# Patient Record
Sex: Female | Born: 1955 | Race: White | Hispanic: No | Marital: Married | Smoking: Never smoker
Health system: Southern US, Community
[De-identification: ages and names within clinical notes are randomized; demographics above are authoritative.]

## PROBLEM LIST (undated history)

## (undated) DIAGNOSIS — C50919 Malignant neoplasm of unspecified site of unspecified female breast: Secondary | ICD-10-CM

## (undated) DIAGNOSIS — Z8052 Family history of malignant neoplasm of bladder: Secondary | ICD-10-CM

## (undated) DIAGNOSIS — Z803 Family history of malignant neoplasm of breast: Secondary | ICD-10-CM

## (undated) DIAGNOSIS — M25569 Pain in unspecified knee: Secondary | ICD-10-CM

## (undated) DIAGNOSIS — G8929 Other chronic pain: Secondary | ICD-10-CM

## (undated) DIAGNOSIS — E782 Mixed hyperlipidemia: Secondary | ICD-10-CM

## (undated) DIAGNOSIS — Z9289 Personal history of other medical treatment: Secondary | ICD-10-CM

## (undated) DIAGNOSIS — I10 Essential (primary) hypertension: Secondary | ICD-10-CM

## (undated) DIAGNOSIS — I519 Heart disease, unspecified: Secondary | ICD-10-CM

## (undated) DIAGNOSIS — R079 Chest pain, unspecified: Secondary | ICD-10-CM

## (undated) DIAGNOSIS — Z9861 Coronary angioplasty status: Secondary | ICD-10-CM

## (undated) DIAGNOSIS — J42 Unspecified chronic bronchitis: Secondary | ICD-10-CM

## (undated) DIAGNOSIS — I2510 Atherosclerotic heart disease of native coronary artery without angina pectoris: Secondary | ICD-10-CM

## (undated) DIAGNOSIS — J984 Other disorders of lung: Secondary | ICD-10-CM

## (undated) DIAGNOSIS — R109 Unspecified abdominal pain: Secondary | ICD-10-CM

## (undated) DIAGNOSIS — E785 Hyperlipidemia, unspecified: Secondary | ICD-10-CM

## (undated) DIAGNOSIS — C801 Malignant (primary) neoplasm, unspecified: Secondary | ICD-10-CM

## (undated) HISTORY — PX: HX CORONARY ARTERY BYPASS GRAFT: SHX141

## (undated) HISTORY — PX: HX TUBAL LIGATION: SHX77

## (undated) HISTORY — DX: Atherosclerotic heart disease of native coronary artery without angina pectoris: I25.10

## (undated) HISTORY — DX: Essential (primary) hypertension: I10

## (undated) HISTORY — DX: Personal history of other medical treatment: Z92.89

## (undated) HISTORY — DX: Unspecified chronic bronchitis: J42

## (undated) HISTORY — PX: CARDIAC CATHETERIZATION: SHX172

## (undated) HISTORY — DX: Mixed hyperlipidemia: E78.2

## (undated) HISTORY — DX: Malignant (primary) neoplasm, unspecified: C80.1

## (undated) HISTORY — PX: CORONARY ARTERY STENT PLACEMENT: PR CATH30456

## (undated) HISTORY — PX: HX MASTECTOMY, SIMPLE: SHX3

## (undated) HISTORY — DX: Coronary angioplasty status: Z98.61

## (undated) HISTORY — PX: CORONARY ANGIOPLASTY: SHX604

## (undated) HISTORY — PX: HX BACK SURGERY: SHX140

## (undated) HISTORY — DX: Heart disease, unspecified: I51.9

## (undated) HISTORY — DX: Other disorders of lung: J98.4

## (undated) HISTORY — DX: Chest pain, unspecified: R07.9

## (undated) HISTORY — DX: Unspecified abdominal pain: R10.9

## (undated) HISTORY — PX: NECK SURGERY: SHX720

## (undated) HISTORY — DX: Hyperlipidemia, unspecified: E78.5

## (undated) HISTORY — DX: Family history of malignant neoplasm of bladder: Z80.52

## (undated) HISTORY — DX: Pain in unspecified knee: M25.569

## (undated) HISTORY — DX: Other chronic pain: G89.29

## (undated) HISTORY — PX: OTHER SURGICAL HISTORY: SHX169

## (undated) HISTORY — DX: Family history of malignant neoplasm of breast: Z80.3

---

## 2010-03-10 HISTORY — PX: COLONOSCOPY: SHX174

## 2011-01-27 ENCOUNTER — Encounter: Payer: Self-pay | Admitting: Gastroenterology

## 2011-02-17 ENCOUNTER — Encounter: Payer: Self-pay | Admitting: Gastroenterology

## 2011-02-17 ENCOUNTER — Ambulatory Visit (AMBULATORY_SURGERY_CENTER): Payer: Managed Care, Other (non HMO) | Admitting: *Deleted

## 2011-02-17 VITALS — Ht 66.0 in | Wt 177.5 lb

## 2011-02-17 DIAGNOSIS — Z1211 Encounter for screening for malignant neoplasm of colon: Secondary | ICD-10-CM

## 2011-02-17 MED ORDER — PEG-KCL-NACL-NASULF-NA ASC-C 100 G PO SOLR: ORAL | Status: DC

## 2011-03-03 ENCOUNTER — Other Ambulatory Visit: Payer: Self-pay | Admitting: Gastroenterology

## 2011-03-16 ENCOUNTER — Ambulatory Visit (AMBULATORY_SURGERY_CENTER): Payer: Managed Care, Other (non HMO) | Admitting: Gastroenterology

## 2011-03-16 ENCOUNTER — Encounter: Payer: Self-pay | Admitting: Gastroenterology

## 2011-03-16 VITALS — BP 144/87 | HR 85 | Temp 97.5°F | Resp 14 | Ht 66.0 in | Wt 170.0 lb

## 2011-03-16 DIAGNOSIS — Z1211 Encounter for screening for malignant neoplasm of colon: Secondary | ICD-10-CM

## 2011-03-16 MED ORDER — SODIUM CHLORIDE 0.9 % IV SOLN: 500.0000 mL | INTRAVENOUS | Status: DC

## 2011-03-16 NOTE — Patient Instructions (Signed)
Follow your discharge instructions.  Continue your medications.  Next Colonoscopy 10 years, 2022!

## 2011-03-17 ENCOUNTER — Telehealth: Payer: Self-pay | Admitting: *Deleted

## 2011-03-17 NOTE — Telephone Encounter (Signed)

## 2011-08-26 ENCOUNTER — Encounter: Payer: Self-pay | Admitting: Gynecology

## 2013-09-23 ENCOUNTER — Ambulatory Visit (INDEPENDENT_AMBULATORY_CARE_PROVIDER_SITE_OTHER): Payer: Managed Care, Other (non HMO) | Admitting: Nurse Practitioner

## 2013-09-23 ENCOUNTER — Encounter (INDEPENDENT_AMBULATORY_CARE_PROVIDER_SITE_OTHER): Payer: Self-pay

## 2013-09-23 ENCOUNTER — Encounter: Payer: Self-pay | Admitting: Nurse Practitioner

## 2013-09-23 VITALS — BP 131/87 | HR 69 | Temp 97.9°F | Ht 65.0 in | Wt 173.0 lb

## 2013-09-23 DIAGNOSIS — Z0000 Encounter for general adult medical examination without abnormal findings: Secondary | ICD-10-CM

## 2013-09-23 NOTE — Patient Instructions (Signed)

## 2013-09-23 NOTE — Progress Notes (Signed)
   Subjective:    Patient ID: Judith Thomas, female    DOB: [birth year], 56 y.o.   MRN: [id number]  HPI  Patient in today for wellness visit- no pap- she is doing well - no complaint today-  No current medical problems and on no meds.    Review of Systems  Constitutional: Negative.   HENT: Negative.   Eyes: Negative.   Respiratory: Negative.   Cardiovascular: Negative.   Gastrointestinal: Negative.   Endocrine: Negative.   Genitourinary: Negative.   Musculoskeletal: Negative.   Allergic/Immunologic: Negative.   Neurological: Negative.   Hematological: Negative.   Psychiatric/Behavioral: Negative.   All other systems reviewed and are negative.       Objective:   Physical Exam  Constitutional: She is oriented to person, place, and time. She appears well-developed and well-nourished.  HENT:  Head: Normocephalic.  Right Ear: Hearing, tympanic membrane, external ear and ear canal normal.  Left Ear: Hearing, tympanic membrane, external ear and ear canal normal.  Nose: Nose normal.  Mouth/Throat: Uvula is midline and oropharynx is clear and moist.  Eyes: Conjunctivae and EOM are normal. Pupils are equal, round, and reactive to light.  Neck: Normal range of motion and full passive range of motion without pain. Neck supple. No JVD present. Carotid bruit is not present. No mass and no thyromegaly present.  Cardiovascular: Normal rate, normal heart sounds and intact distal pulses.   No murmur heard. Pulmonary/Chest: Effort normal and breath sounds normal.  Abdominal: Soft. Bowel sounds are normal. She exhibits no mass. There is no tenderness.  Genitourinary: No breast swelling, tenderness, discharge or bleeding.  bimanual exam-No adnexal masses or tenderness.  Breast- no masses palpable  Musculoskeletal: Normal range of motion.  Lymphadenopathy:    She has no cervical adenopathy.  Neurological: She is alert and oriented to person, place, and time.  Skin: Skin is warm and dry.    Psychiatric: She has a normal mood and affect. Her behavior is normal. Judgment and thought content normal.   BP 131/87  Pulse 69  Temp(Src) 97.9 F (36.6 C) (Oral)  Ht 5\' 5"  (1.651 m)  Wt 173 lb (78.472 kg)  BMI 28.79 kg/m2        Assessment & Plan:   1. Annual physical exam    Orders Placed This Encounter  Procedures  . CMP14+EGFR  . Lipid panel  . POCT CBC     Continue all meds Labs pending Diet and exercise encouraged Health maintenance reviewed Follow up in 1 year  [name] Daphine Deutscher, FNP

## 2013-09-23 NOTE — Addendum Note (Signed)
Addended by: Orma Render F on: 09/23/2013 11:04 AM   Modules accepted: Orders

## 2013-09-24 LAB — CMP14+EGFR
AST: 16 IU/L (ref 0–40)
Albumin/Globulin Ratio: 1.9 (ref 1.1–2.5)
Albumin: 4.4 g/dL (ref 3.5–5.5)
BUN/Creatinine Ratio: 21 (ref 9–23)
BUN: 14 mg/dL (ref 6–24)
CO2: 23 mmol/L (ref 18–29)
Calcium: 9.3 mg/dL (ref 8.7–10.2)
Chloride: 101 mmol/L (ref 97–108)
Creatinine, Ser: 0.68 mg/dL (ref 0.57–1.00)
GFR calc Af Amer: 113 mL/min/{1.73_m2} (ref >59)
Glucose: 89 mg/dL (ref 65–99)
Potassium: 4.7 mmol/L (ref 3.5–5.2)
Sodium: 140 mmol/L (ref 134–144)
Total Protein: 6.7 g/dL (ref 6.0–8.5)

## 2013-09-24 LAB — CBC WITH DIFFERENTIAL
Basos: 1 %
Eos: 2 %
Eosinophils Absolute: 0.1 10*3/uL (ref 0.0–0.4)
HCT: 38.3 % (ref 34.0–46.6)
Hemoglobin: 13.1 g/dL (ref 11.1–15.9)
Immature Grans (Abs): 0 10*3/uL (ref 0.0–0.1)
Immature Granulocytes: 0 %
Lymphocytes Absolute: 1.3 10*3/uL (ref 0.7–3.1)
Lymphs: 28 %
MCH: 31.6 pg (ref 26.6–33.0)
MCHC: 34.2 g/dL (ref 31.5–35.7)
MCV: 92 fL (ref 79–97)
Monocytes: 7 %
Neutrophils Absolute: 3 10*3/uL (ref 1.4–7.0)
Neutrophils Relative %: 62 %
Platelets: 262 10*3/uL (ref 150–379)
RBC: 4.15 x10E6/uL (ref 3.77–5.28)
WBC: 4.7 10*3/uL (ref 3.4–10.8)

## 2013-09-24 LAB — LIPID PANEL
Chol/HDL Ratio: 2.1 ratio units (ref 0.0–4.4)
Cholesterol, Total: 117 mg/dL (ref 100–199)
HDL: 57 mg/dL (ref >39)
Triglycerides: 68 mg/dL (ref 0–149)

## 2015-07-28 ENCOUNTER — Ambulatory Visit (INDEPENDENT_AMBULATORY_CARE_PROVIDER_SITE_OTHER): Payer: BLUE CROSS/BLUE SHIELD

## 2015-07-28 ENCOUNTER — Ambulatory Visit (INDEPENDENT_AMBULATORY_CARE_PROVIDER_SITE_OTHER): Payer: BLUE CROSS/BLUE SHIELD | Admitting: Nurse Practitioner

## 2015-07-28 ENCOUNTER — Encounter: Payer: Self-pay | Admitting: Nurse Practitioner

## 2015-07-28 VITALS — BP 128/82 | HR 66 | Temp 97.0°F | Ht 65.0 in | Wt 185.0 lb

## 2015-07-28 DIAGNOSIS — Z1212 Encounter for screening for malignant neoplasm of rectum: Secondary | ICD-10-CM | POA: Diagnosis not present

## 2015-07-28 DIAGNOSIS — Z0000 Encounter for general adult medical examination without abnormal findings: Secondary | ICD-10-CM

## 2015-07-28 DIAGNOSIS — Z6828 Body mass index (BMI) 28.0-28.9, adult: Secondary | ICD-10-CM | POA: Diagnosis not present

## 2015-07-28 DIAGNOSIS — Z23 Encounter for immunization: Secondary | ICD-10-CM

## 2015-07-28 NOTE — Addendum Note (Signed)
Addended by: Chevis Pretty on: 07/28/2015 09:50 AM   Modules accepted: Orders, SmartSet

## 2015-07-28 NOTE — Patient Instructions (Signed)
Exercising to Stay Healthy Exercising regularly is important. It has many health benefits, such as:  Improving your overall fitness, flexibility, and endurance.  Increasing your bone density.  Helping with weight control.  Decreasing your body fat.  Increasing your muscle strength.  Reducing stress and tension.  Improving your overall health. In order to become healthy and stay healthy, it is recommended that you do moderate-intensity and vigorous-intensity exercise. You can tell that you are exercising at a moderate intensity if you have a higher heart rate and faster breathing, but you are still able to hold a conversation. You can tell that you are exercising at a vigorous intensity if you are breathing much harder and faster and cannot hold a conversation while exercising. HOW OFTEN SHOULD I EXERCISE? Choose an activity that you enjoy and set realistic goals. Your health care provider can help you to make an activity plan that works for you. Exercise regularly as directed by your health care provider. This may include:   Doing resistance training twice each week, such as:  Push-ups.  Sit-ups.  Lifting weights.  Using resistance bands.  Doing a given intensity of exercise for a given amount of time. Choose from these options:  150 minutes of moderate-intensity exercise every week.  75 minutes of vigorous-intensity exercise every week.  A mix of moderate-intensity and vigorous-intensity exercise every week. Children, pregnant women, people who are out of shape, people who are overweight, and older adults may need to consult a health care provider for individual recommendations. If you have any sort of medical condition, be sure to consult your health care provider before starting a new exercise program.  WHAT ARE SOME EXERCISE IDEAS? Some moderate-intensity exercise ideas include:   Walking at a rate of 1 mile in 15  minutes.  Biking.  Hiking.  Golfing.  Dancing. Some vigorous-intensity exercise ideas include:   Walking at a rate of at least 4.5 miles per hour.  Jogging or running at a rate of 5 miles per hour.  Biking at a rate of at least 10 miles per hour.  Lap swimming.  Roller-skating or in-line skating.  Cross-country skiing.  Vigorous competitive sports, such as football, basketball, and soccer.  Jumping rope.  Aerobic dancing. WHAT ARE SOME EVERYDAY ACTIVITIES THAT CAN HELP ME TO GET EXERCISE?  Yard work, such as:  Pushing a lawn mower.  Raking and bagging leaves.  Washing and waxing your car.  Pushing a stroller.  Shoveling snow.  Gardening.  Washing windows or floors. HOW CAN I BE MORE ACTIVE IN MY DAY-TO-DAY ACTIVITIES?  Use the stairs instead of the elevator.  Take a walk during your lunch break.  If you drive, park your car farther away from work or school.  If you take public transportation, get off one stop early and walk the rest of the way.  Make all of your phone calls while standing up and walking around.  Get up, stretch, and walk around every 30 minutes throughout the day. WHAT GUIDELINES SHOULD I FOLLOW WHILE EXERCISING?  Do not exercise so much that you hurt yourself, feel dizzy, or get very short of breath.  Consult your health care provider before starting a new exercise program.  Wear comfortable clothes and shoes with good support.  Drink plenty of water while you exercise to prevent dehydration or heat stroke. Body water is lost during exercise and must be replaced.  Work out until you breathe faster and your heart beats faster.   This information   is not intended to replace advice given to you by your health care provider. Make sure you discuss any questions you have with your health care provider.   Document Released: 10/29/2010 Document Revised: 10/17/2014 Document Reviewed: 02/27/2014 Elsevier Interactive Patient Education  2016 [employer]  

## 2015-07-28 NOTE — Progress Notes (Addendum)
   Subjective:    Patient ID: Judith Thomas, female    DOB: [birth year], 59 y.o.   MRN: [id number]  HPI   Patient in today for wellness visit- no pap- she is doing well - no complaint today-  No current medical problems and on no meds.    Review of Systems  Constitutional: Negative.   HENT: Negative.   Eyes: Negative.   Respiratory: Negative.   Cardiovascular: Negative.   Gastrointestinal: Negative.   Endocrine: Negative.   Genitourinary: Negative.   Musculoskeletal: Negative.   Allergic/Immunologic: Negative.   Neurological: Negative.   Hematological: Negative.   Psychiatric/Behavioral: Negative.   All other systems reviewed and are negative.      Objective:   Physical Exam  Constitutional: She is oriented to person, place, and time. She appears well-developed and well-nourished.  HENT:  Head: Normocephalic.  Right Ear: Hearing, tympanic membrane, external ear and ear canal normal.  Left Ear: Hearing, tympanic membrane, external ear and ear canal normal.  Nose: Nose normal.  Mouth/Throat: Uvula is midline and oropharynx is clear and moist.  Eyes: Conjunctivae and EOM are normal. Pupils are equal, round, and reactive to light.  Neck: Normal range of motion and full passive range of motion without pain. Neck supple. No JVD present. Carotid bruit is not present. No thyroid mass and no thyromegaly present.  Cardiovascular: Normal rate, normal heart sounds and intact distal pulses.   No murmur heard. Pulmonary/Chest: Effort normal and breath sounds normal.  Abdominal: Soft. Bowel sounds are normal. She exhibits no mass. There is no tenderness.  Genitourinary: No breast swelling, tenderness, discharge or bleeding.  bimanual exam-No adnexal masses or tenderness.  Breast- no masses palpable  Musculoskeletal: Normal range of motion.  Lymphadenopathy:    She has no cervical adenopathy.  Neurological: She is alert and oriented to person, place, and time.  Skin: Skin is warm and  dry.  Psychiatric: She has a normal mood and affect. Her behavior is normal. Judgment and thought content normal.    BP 128/82 mmHg  Pulse 66  Temp(Src) 97 F (36.1 C) (Oral)  Ht $R'5\' 5"'Ek$  (1.651 m)  Wt 185 lb (83.915 kg)  BMI 30.79 kg/m2  EKG- sinus bradycardia-[name] Hassell Done, FNP   Chest xray- no cardiopulmonary disease noted-Preliminary reading by Ronnald Collum, FNP  [facility]         Assessment & Plan:   1. Annual physical exam - CMP14+EGFR - Lipid panel - CBC with Differential/Platelet - Thyroid Panel With TSH - Vit D  25 hydroxy (rtn osteoporosis monitoring) - DG Chest 2 View; Future - EKG 12-Lead  2. BMI 28.0-28.9,adult Discussed diet and exercise for person with BMI >25 Will recheck weight in 3-6 months    hemoccult cards given to patient with directions Labs pending Health maintenance reviewed Diet and exercise encouraged Continue all meds Follow up  In 6 months   [place], FNP

## 2015-07-29 DIAGNOSIS — Z23 Encounter for immunization: Secondary | ICD-10-CM

## 2015-07-29 LAB — THYROID PANEL WITH TSH
FREE THYROXINE INDEX: 2.2 (ref 1.2–4.9)
T3 UPTAKE RATIO: 28 % (ref 24–39)
T4, Total: 7.9 ug/dL (ref 4.5–12.0)
TSH: 1.37 u[IU]/mL (ref 0.450–4.500)

## 2015-07-29 LAB — CBC WITH DIFFERENTIAL/PLATELET
Basophils Absolute: 0 10*3/uL (ref 0.0–0.2)
Basos: 1 %
EOS (ABSOLUTE): 0.1 10*3/uL (ref 0.0–0.4)
Eos: 3 %
Hematocrit: 40.9 % (ref 34.0–46.6)
Hemoglobin: 13.3 g/dL (ref 11.1–15.9)
Immature Grans (Abs): 0 10*3/uL (ref 0.0–0.1)
Immature Granulocytes: 0 %
Lymphocytes Absolute: 1.4 10*3/uL (ref 0.7–3.1)
Lymphs: 31 %
MCH: 30.9 pg (ref 26.6–33.0)
MCHC: 32.5 g/dL (ref 31.5–35.7)
MCV: 95 fL (ref 79–97)
Monocytes Absolute: 0.3 10*3/uL (ref 0.1–0.9)
Monocytes: 7 %
NEUTROS PCT: 58 %
Neutrophils Absolute: 2.6 10*3/uL (ref 1.4–7.0)
Platelets: 221 10*3/uL (ref 150–379)
RBC: 4.3 x10E6/uL (ref 3.77–5.28)
RDW: 13.9 % (ref 12.3–15.4)
WBC: 4.4 10*3/uL (ref 3.4–10.8)

## 2015-07-29 LAB — LIPID PANEL
CHOL/HDL RATIO: 2.2 ratio (ref 0.0–4.4)
Cholesterol, Total: 125 mg/dL (ref 100–199)
HDL: 56 mg/dL (ref >39)
LDL Calculated: 52 mg/dL (ref 0–99)
Triglycerides: 85 mg/dL (ref 0–149)
VLDL Cholesterol Cal: 17 mg/dL (ref 5–40)

## 2015-07-29 LAB — CMP14+EGFR
ALBUMIN: 4.3 g/dL (ref 3.5–5.5)
ALT: 13 IU/L (ref 0–32)
AST: 17 IU/L (ref 0–40)
Albumin/Globulin Ratio: 1.7 (ref 1.1–2.5)
Alkaline Phosphatase: 108 IU/L (ref 39–117)
BUN / CREAT RATIO: 15 (ref 9–23)
BUN: 10 mg/dL (ref 6–24)
Bilirubin Total: 0.6 mg/dL (ref 0.0–1.2)
CO2: 27 mmol/L (ref 18–29)
CREATININE: 0.67 mg/dL (ref 0.57–1.00)
Calcium: 9.3 mg/dL (ref 8.7–10.2)
Chloride: 99 mmol/L (ref 97–106)
GFR calc Af Amer: 112 mL/min/{1.73_m2} (ref >59)
GFR calc non Af Amer: 97 mL/min/{1.73_m2} (ref >59)
GLOBULIN, TOTAL: 2.5 g/dL (ref 1.5–4.5)
Glucose: 94 mg/dL (ref 65–99)
Potassium: 4 mmol/L (ref 3.5–5.2)
Sodium: 140 mmol/L (ref 136–144)
TOTAL PROTEIN: 6.8 g/dL (ref 6.0–8.5)

## 2015-07-29 LAB — VITAMIN D 25 HYDROXY (VIT D DEFICIENCY, FRACTURES): Vit D, 25-Hydroxy: 22.7 ng/mL — ABNORMAL LOW (ref 30.0–100.0)

## 2015-07-30 ENCOUNTER — Encounter: Payer: Self-pay | Admitting: Nurse Practitioner

## 2015-08-06 ENCOUNTER — Other Ambulatory Visit: Payer: BLUE CROSS/BLUE SHIELD

## 2015-08-06 DIAGNOSIS — Z1212 Encounter for screening for malignant neoplasm of rectum: Secondary | ICD-10-CM

## 2015-08-06 NOTE — Progress Notes (Signed)
Lab only 

## 2015-08-11 LAB — FECAL OCCULT BLOOD, IMMUNOCHEMICAL: Fecal Occult Bld: NEGATIVE

## 2015-09-08 LAB — HM MAMMOGRAPHY: HM MAMMO: NEGATIVE

## 2015-09-14 ENCOUNTER — Encounter: Payer: Self-pay | Admitting: *Deleted

## 2015-10-11 DIAGNOSIS — G8929 Other chronic pain: Secondary | ICD-10-CM

## 2015-10-11 DIAGNOSIS — M25569 Pain in unspecified knee: Secondary | ICD-10-CM

## 2015-10-11 HISTORY — DX: Pain in unspecified knee: M25.569

## 2015-10-11 HISTORY — DX: Other chronic pain: G89.29

## 2016-02-09 ENCOUNTER — Ambulatory Visit (HOSPITAL_COMMUNITY): Payer: Self-pay | Admitting: Obstetrics & Gynecology

## 2016-05-02 DIAGNOSIS — B078 Other viral warts: Secondary | ICD-10-CM | POA: Diagnosis not present

## 2016-05-02 DIAGNOSIS — L820 Inflamed seborrheic keratosis: Secondary | ICD-10-CM | POA: Diagnosis not present

## 2016-08-02 ENCOUNTER — Ambulatory Visit (INDEPENDENT_AMBULATORY_CARE_PROVIDER_SITE_OTHER): Payer: BLUE CROSS/BLUE SHIELD | Admitting: Nurse Practitioner

## 2016-08-02 ENCOUNTER — Encounter: Payer: Self-pay | Admitting: Nurse Practitioner

## 2016-08-02 VITALS — BP 139/85 | HR 60 | Temp 98.1°F | Ht 65.0 in | Wt 185.0 lb

## 2016-08-02 DIAGNOSIS — Z1159 Encounter for screening for other viral diseases: Secondary | ICD-10-CM

## 2016-08-02 DIAGNOSIS — Z6828 Body mass index (BMI) 28.0-28.9, adult: Secondary | ICD-10-CM | POA: Diagnosis not present

## 2016-08-02 DIAGNOSIS — Z23 Encounter for immunization: Secondary | ICD-10-CM

## 2016-08-02 DIAGNOSIS — Z1211 Encounter for screening for malignant neoplasm of colon: Secondary | ICD-10-CM

## 2016-08-02 DIAGNOSIS — Z01419 Encounter for gynecological examination (general) (routine) without abnormal findings: Secondary | ICD-10-CM

## 2016-08-02 DIAGNOSIS — Z0000 Encounter for general adult medical examination without abnormal findings: Secondary | ICD-10-CM

## 2016-08-02 DIAGNOSIS — Z1212 Encounter for screening for malignant neoplasm of rectum: Secondary | ICD-10-CM

## 2016-08-02 NOTE — Progress Notes (Signed)
   Subjective:    Patient ID: Judith Thomas, female    DOB: Sep 02, 1956, 60 y.o.   MRN: [id number]  HPI Patient here today for annual physical exam and PAP. She has no current medical problems and is on no meds. No complaints today.    Review of Systems  Constitutional: Negative for diaphoresis.  HENT: Negative.   Eyes: Negative.  Negative for pain.  Respiratory: Negative for shortness of breath.   Cardiovascular: Negative for chest pain, palpitations and leg swelling.  Gastrointestinal: Negative for abdominal pain.  Endocrine: Negative for polydipsia.  Genitourinary: Negative.   Musculoskeletal: Negative.   Skin: Negative for rash.  Neurological: Negative for dizziness, weakness and headaches.  Hematological: Does not bruise/bleed easily.  Psychiatric/Behavioral: Negative.   All other systems reviewed and are negative.      Objective:   Physical Exam  Constitutional: She is oriented to person, place, and time. She appears well-developed and well-nourished.  HENT:  Head: Normocephalic.  Right Ear: Hearing, tympanic membrane, external ear and ear canal normal.  Left Ear: Hearing, tympanic membrane, external ear and ear canal normal.  Nose: Nose normal.  Mouth/Throat: Uvula is midline and oropharynx is clear and moist.  Eyes: Conjunctivae and EOM are normal. Pupils are equal, round, and reactive to light.  Neck: Normal range of motion and full passive range of motion without pain. Neck supple. No JVD present. Carotid bruit is not present. No thyroid mass and no thyromegaly present.  Cardiovascular: Normal rate, normal heart sounds and intact distal pulses.   No murmur heard. Pulmonary/Chest: Effort normal and breath sounds normal. Right breast exhibits no inverted nipple, no mass, no nipple discharge, no skin change and no tenderness. Left breast exhibits no inverted nipple, no mass, no nipple discharge, no skin change and no tenderness.  Abdominal: Soft. Bowel sounds are normal.  She exhibits no mass. There is no tenderness.  Genitourinary: Vagina normal and uterus normal. No breast swelling, tenderness, discharge or bleeding.  Genitourinary Comments: bimanual exam-No adnexal masses or tenderness.   Musculoskeletal: Normal range of motion.  Lymphadenopathy:    She has no cervical adenopathy.  Neurological: She is alert and oriented to person, place, and time.  Skin: Skin is warm and dry.  Psychiatric: She has a normal mood and affect. Her behavior is normal. Judgment and thought content normal.   BP 139/85   Pulse 60   Temp 98.1 F (36.7 C) (Oral)   Ht '5\' 5"'$  (1.651 m)   Wt 185 lb (83.9 kg)   BMI 30.79 kg/m         Assessment & Plan:  1. BMI 28.0-28.9,adult Discussed diet and exercise for person with BMI >25 Will recheck weight in 3-6 months  2. Annual physical exam - CMP14+EGFR - Lipid panel - CBC with Differential/Platelet - Thyroid Panel With TSH - VITAMIN D 25 Hydroxy (Vit-D Deficiency, Fractures)  3. Encounter for gynecological examination without abnormal finding - IGP, Aptima HPV, rfx 16/18,45  4. Need for hepatitis C screening test - Hepatitis C antibody  5. Screening for colorectal cancer - Fecal occult blood, imunochemical; Future    Labs pending Health maintenance reviewed Diet and exercise encouraged Continue all meds Follow up  In 1 year   [place], FNP

## 2016-08-02 NOTE — Patient Instructions (Signed)
Health Maintenance, Female Adopting a healthy lifestyle and getting preventive care can go a long way to promote health and wellness. Talk with your health care provider about what schedule of regular examinations is right for you. This is a good chance for you to check in with your provider about disease prevention and staying healthy. In between checkups, there are plenty of things you can do on your own. Experts have done a lot of research about which lifestyle changes and preventive measures are most likely to keep you healthy. Ask your health care provider for more information. WEIGHT AND DIET  Eat a healthy diet  Be sure to include plenty of vegetables, fruits, low-fat dairy products, and lean protein.  Do not eat a lot of foods high in solid fats, added sugars, or salt.  Get regular exercise. This is one of the most important things you can do for your health.  Most adults should exercise for at least 150 minutes each week. The exercise should increase your heart rate and make you sweat (moderate-intensity exercise).  Most adults should also do strengthening exercises at least twice a week. This is in addition to the moderate-intensity exercise.  Maintain a healthy weight  Body mass index (BMI) is a measurement that can be used to identify possible weight problems. It estimates body fat based on height and weight. Your health care provider can help determine your BMI and help you achieve or maintain a healthy weight.  For females 20 years of age and older:   A BMI below 18.5 is considered underweight.  A BMI of 18.5 to 24.9 is normal.  A BMI of 25 to 29.9 is considered overweight.  A BMI of 30 and above is considered obese.  Watch levels of cholesterol and blood lipids  You should start having your blood tested for lipids and cholesterol at 59 years of age, then have this test every 5 years.  You may need to have your cholesterol levels checked more often if:  Your lipid  or cholesterol levels are high.  You are older than 59 years of age.  You are at high risk for heart disease.  CANCER SCREENING   Lung Cancer  Lung cancer screening is recommended for adults 55-80 years old who are at high risk for lung cancer because of a history of smoking.  A yearly low-dose CT scan of the lungs is recommended for people who:  Currently smoke.  Have quit within the past 15 years.  Have at least a 30-pack-year history of smoking. A pack year is smoking an average of one pack of cigarettes a day for 1 year.  Yearly screening should continue until it has been 15 years since you quit.  Yearly screening should stop if you develop a health problem that would prevent you from having lung cancer treatment.  Breast Cancer  Practice breast self-awareness. This means understanding how your breasts normally appear and feel.  It also means doing regular breast self-exams. Let your health care provider know about any changes, no matter how small.  If you are in your 20s or 30s, you should have a clinical breast exam (CBE) by a health care provider every 1-3 years as part of a regular health exam.  If you are 40 or older, have a CBE every year. Also consider having a breast X-ray (mammogram) every year.  If you have a family history of breast cancer, talk to your health care provider about genetic screening.  If you   are at high risk for breast cancer, talk to your health care provider about having an MRI and a mammogram every year.  Breast cancer gene (BRCA) assessment is recommended for women who have family members with BRCA-related cancers. BRCA-related cancers include:  Breast.  Ovarian.  Tubal.  Peritoneal cancers.  Results of the assessment will determine the need for genetic counseling and BRCA1 and BRCA2 testing. Cervical Cancer Your health care provider may recommend that you be screened regularly for cancer of the pelvic organs (ovaries, uterus, and  vagina). This screening involves a pelvic examination, including checking for microscopic changes to the surface of your cervix (Pap test). You may be encouraged to have this screening done every 3 years, beginning at age 21.  For women ages 30-65, health care providers may recommend pelvic exams and Pap testing every 3 years, or they may recommend the Pap and pelvic exam, combined with testing for human papilloma virus (HPV), every 5 years. Some types of HPV increase your risk of cervical cancer. Testing for HPV may also be done on women of any age with unclear Pap test results.  Other health care providers may not recommend any screening for nonpregnant women who are considered low risk for pelvic cancer and who do not have symptoms. Ask your health care provider if a screening pelvic exam is right for you.  If you have had past treatment for cervical cancer or a condition that could lead to cancer, you need Pap tests and screening for cancer for at least 20 years after your treatment. If Pap tests have been discontinued, your risk factors (such as having a new sexual partner) need to be reassessed to determine if screening should resume. Some women have medical problems that increase the chance of getting cervical cancer. In these cases, your health care provider may recommend more frequent screening and Pap tests. Colorectal Cancer  This type of cancer can be detected and often prevented.  Routine colorectal cancer screening usually begins at 59 years of age and continues through 59 years of age.  Your health care provider may recommend screening at an earlier age if you have risk factors for colon cancer.  Your health care provider may also recommend using home test kits to check for hidden blood in the stool.  A small camera at the end of a tube can be used to examine your colon directly (sigmoidoscopy or colonoscopy). This is done to check for the earliest forms of colorectal  cancer.  Routine screening usually begins at age 50.  Direct examination of the colon should be repeated every 5-10 years through 59 years of age. However, you may need to be screened more often if early forms of precancerous polyps or small growths are found. Skin Cancer  Check your skin from head to toe regularly.  Tell your health care provider about any new moles or changes in moles, especially if there is a change in a mole's shape or color.  Also tell your health care provider if you have a mole that is larger than the size of a pencil eraser.  Always use sunscreen. Apply sunscreen liberally and repeatedly throughout the day.  Protect yourself by wearing long sleeves, pants, a wide-brimmed hat, and sunglasses whenever you are outside. HEART DISEASE, DIABETES, AND HIGH BLOOD PRESSURE   High blood pressure causes heart disease and increases the risk of stroke. High blood pressure is more likely to develop in:  People who have blood pressure in the high end   of the normal range (130-139/85-89 mm Hg).  People who are overweight or obese.  People who are African American.  If you are 38-23 years of age, have your blood pressure checked every 3-5 years. If you are 61 years of age or older, have your blood pressure checked every year. You should have your blood pressure measured twice--once when you are at a hospital or clinic, and once when you are not at a hospital or clinic. Record the average of the two measurements. To check your blood pressure when you are not at a hospital or clinic, you can use:  An automated blood pressure machine at a pharmacy.  A home blood pressure monitor.  If you are between 45 years and 39 years old, ask your health care provider if you should take aspirin to prevent strokes.  Have regular diabetes screenings. This involves taking a blood sample to check your fasting blood sugar level.  If you are at a normal weight and have a low risk for diabetes,  have this test once every three years after 59 years of age.  If you are overweight and have a high risk for diabetes, consider being tested at a younger age or more often. PREVENTING INFECTION  Hepatitis B  If you have a higher risk for hepatitis B, you should be screened for this virus. You are considered at high risk for hepatitis B if:  You were born in a country where hepatitis B is common. Ask your health care provider which countries are considered high risk.  Your parents were born in a high-risk country, and you have not been immunized against hepatitis B (hepatitis B vaccine).  You have HIV or AIDS.  You use needles to inject street drugs.  You live with someone who has hepatitis B.  You have had sex with someone who has hepatitis B.  You get hemodialysis treatment.  You take certain medicines for conditions, including cancer, organ transplantation, and autoimmune conditions. Hepatitis C  Blood testing is recommended for:  Everyone born from [birth year] through 1964.  Anyone with known risk factors for hepatitis C. Sexually transmitted infections (STIs)  You should be screened for sexually transmitted infections (STIs) including gonorrhea and chlamydia if:  You are sexually active and are younger than 59 years of age.  You are older than 59 years of age and your health care provider tells you that you are at risk for this type of infection.  Your sexual activity has changed since you were last screened and you are at an increased risk for chlamydia or gonorrhea. Ask your health care provider if you are at risk.  If you do not have HIV, but are at risk, it may be recommended that you take a prescription medicine daily to prevent HIV infection. This is called pre-exposure prophylaxis (PrEP). You are considered at risk if:  You are sexually active and do not regularly use condoms or know the HIV status of your partner(s).  You take drugs by injection.  You are sexually  active with a partner who has HIV. Talk with your health care provider about whether you are at high risk of being infected with HIV. If you choose to begin PrEP, you should first be tested for HIV. You should then be tested every 3 months for as long as you are taking PrEP.  PREGNANCY   If you are premenopausal and you may become pregnant, ask your health care provider about preconception counseling.  If you may  become pregnant, take 400 to 800 micrograms (mcg) of folic acid every day.  If you want to prevent pregnancy, talk to your health care provider about birth control (contraception). OSTEOPOROSIS AND MENOPAUSE   Osteoporosis is a disease in which the bones lose minerals and strength with aging. This can result in serious bone fractures. Your risk for osteoporosis can be identified using a bone density scan.  If you are 61 years of age or older, or if you are at risk for osteoporosis and fractures, ask your health care provider if you should be screened.  Ask your health care provider whether you should take a calcium or vitamin D supplement to lower your risk for osteoporosis.  Menopause may have certain physical symptoms and risks.  Hormone replacement therapy may reduce some of these symptoms and risks. Talk to your health care provider about whether hormone replacement therapy is right for you.  HOME CARE INSTRUCTIONS   Schedule regular health, dental, and eye exams.  Stay current with your immunizations.   Do not use any tobacco products including cigarettes, chewing tobacco, or electronic cigarettes.  If you are pregnant, do not drink alcohol.  If you are breastfeeding, limit how much and how often you drink alcohol.  Limit alcohol intake to no more than 1 drink per day for nonpregnant women. One drink equals 12 ounces of beer, 5 ounces of wine, or 1 ounces of hard liquor.  Do not use street drugs.  Do not share needles.  Ask your health care provider for help if  you need support or information about quitting drugs.  Tell your health care provider if you often feel depressed.  Tell your health care provider if you have ever been abused or do not feel safe at home.   This information is not intended to replace advice given to you by your health care provider. Make sure you discuss any questions you have with your health care provider.   Document Released: 04/11/2011 Document Revised: 10/17/2014 Document Reviewed: 08/28/2013 Elsevier Interactive Patient Education [employer].

## 2016-08-03 LAB — CBC WITH DIFFERENTIAL/PLATELET
BASOS ABS: 0 10*3/uL (ref 0.0–0.2)
BASOS: 0 %
EOS (ABSOLUTE): 0.1 10*3/uL (ref 0.0–0.4)
Eos: 2 %
Hematocrit: 38.8 % (ref 34.0–46.6)
Hemoglobin: 13.1 g/dL (ref 11.1–15.9)
IMMATURE GRANS (ABS): 0 10*3/uL (ref 0.0–0.1)
Immature Granulocytes: 0 %
LYMPHS: 26 %
Lymphocytes Absolute: 1.4 10*3/uL (ref 0.7–3.1)
MCH: 31.1 pg (ref 26.6–33.0)
MCHC: 33.8 g/dL (ref 31.5–35.7)
MCV: 92 fL (ref 79–97)
Monocytes Absolute: 0.4 10*3/uL (ref 0.1–0.9)
Monocytes: 8 %
NEUTROS ABS: 3.3 10*3/uL (ref 1.4–7.0)
Neutrophils: 64 %
Platelets: 213 10*3/uL (ref 150–379)
RBC: 4.21 x10E6/uL (ref 3.77–5.28)
RDW: 14 % (ref 12.3–15.4)
WBC: 5.2 10*3/uL (ref 3.4–10.8)

## 2016-08-03 LAB — THYROID PANEL WITH TSH
Free Thyroxine Index: 1.9 (ref 1.2–4.9)
T3 Uptake Ratio: 27 % (ref 24–39)
T4, Total: 6.9 ug/dL (ref 4.5–12.0)
TSH: 1.78 u[IU]/mL (ref 0.450–4.500)

## 2016-08-03 LAB — CMP14+EGFR
ALT: 12 IU/L (ref 0–32)
AST: 13 IU/L (ref 0–40)
Albumin/Globulin Ratio: 1.5 (ref 1.2–2.2)
Albumin: 4.4 g/dL (ref 3.5–5.5)
Alkaline Phosphatase: 106 IU/L (ref 39–117)
BUN/Creatinine Ratio: 16 (ref 9–23)
BUN: 12 mg/dL (ref 6–24)
Bilirubin Total: 0.6 mg/dL (ref 0.0–1.2)
CO2: 27 mmol/L (ref 18–29)
CREATININE: 0.77 mg/dL (ref 0.57–1.00)
Calcium: 9.5 mg/dL (ref 8.7–10.2)
Chloride: 101 mmol/L (ref 96–106)
GFR calc Af Amer: 98 mL/min/{1.73_m2} (ref >59)
GFR, EST NON AFRICAN AMERICAN: 85 mL/min/{1.73_m2} (ref >59)
Globulin, Total: 2.9 g/dL (ref 1.5–4.5)
Glucose: 93 mg/dL (ref 65–99)
POTASSIUM: 4.8 mmol/L (ref 3.5–5.2)
Sodium: 143 mmol/L (ref 134–144)
Total Protein: 7.3 g/dL (ref 6.0–8.5)

## 2016-08-03 LAB — LIPID PANEL
CHOL/HDL RATIO: 2.3 ratio (ref 0.0–4.4)
Cholesterol, Total: 126 mg/dL (ref 100–199)
HDL: 54 mg/dL (ref >39)
LDL CALC: 55 mg/dL (ref 0–99)
Triglycerides: 86 mg/dL (ref 0–149)
VLDL CHOLESTEROL CAL: 17 mg/dL (ref 5–40)

## 2016-08-03 LAB — HEPATITIS C ANTIBODY

## 2016-08-03 LAB — VITAMIN D 25 HYDROXY (VIT D DEFICIENCY, FRACTURES): Vit D, 25-Hydroxy: 23.2 ng/mL — ABNORMAL LOW (ref 30.0–100.0)

## 2016-08-04 ENCOUNTER — Encounter: Payer: Self-pay | Admitting: Nurse Practitioner

## 2016-08-08 LAB — IGP, APTIMA HPV, RFX 16/18,45
HPV Aptima: NEGATIVE
PAP Smear Comment: 0

## 2016-08-11 ENCOUNTER — Encounter: Payer: Self-pay | Admitting: Nurse Practitioner

## 2016-08-29 ENCOUNTER — Other Ambulatory Visit: Payer: BLUE CROSS/BLUE SHIELD

## 2016-08-29 DIAGNOSIS — Z1211 Encounter for screening for malignant neoplasm of colon: Secondary | ICD-10-CM

## 2016-08-29 DIAGNOSIS — Z1212 Encounter for screening for malignant neoplasm of rectum: Principal | ICD-10-CM

## 2016-08-31 LAB — FECAL OCCULT BLOOD, IMMUNOCHEMICAL: Fecal Occult Bld: NEGATIVE

## 2016-09-13 DIAGNOSIS — Z803 Family history of malignant neoplasm of breast: Secondary | ICD-10-CM | POA: Diagnosis not present

## 2016-09-13 DIAGNOSIS — Z1231 Encounter for screening mammogram for malignant neoplasm of breast: Secondary | ICD-10-CM | POA: Diagnosis not present

## 2017-01-24 ENCOUNTER — Ambulatory Visit (INDEPENDENT_AMBULATORY_CARE_PROVIDER_SITE_OTHER): Payer: BLUE CROSS/BLUE SHIELD | Admitting: Family

## 2017-01-24 ENCOUNTER — Encounter: Payer: Self-pay | Admitting: Family

## 2017-01-24 VITALS — BP 136/78 | HR 65 | Temp 97.1°F | Ht 65.0 in | Wt 190.0 lb

## 2017-01-24 DIAGNOSIS — R300 Dysuria: Secondary | ICD-10-CM | POA: Diagnosis not present

## 2017-01-24 DIAGNOSIS — N3001 Acute cystitis with hematuria: Secondary | ICD-10-CM | POA: Diagnosis not present

## 2017-01-24 LAB — URINALYSIS, COMPLETE
BILIRUBIN UA: NEGATIVE
GLUCOSE, UA: NEGATIVE
KETONES UA: NEGATIVE
NITRITE UA: NEGATIVE
PROTEIN UA: NEGATIVE
Specific Gravity, UA: 1.01 (ref 1.005–1.030)
Urobilinogen, Ur: 0.2 mg/dL (ref 0.2–1.0)
pH, UA: 7 (ref 5.0–7.5)

## 2017-01-24 LAB — MICROSCOPIC EXAMINATION: WBC, UA: >30 /hpf — AB (ref 0–?)

## 2017-01-24 MED ORDER — NITROFURANTOIN MONOHYD MACRO 100 MG PO CAPS: 100.0000 mg | ORAL_CAPSULE | Freq: Two times a day (BID) | ORAL | 0 refills | Status: DC

## 2017-01-24 NOTE — Patient Instructions (Signed)
Urinary Frequency, Adult Urinary frequency means urinating more often than usual. People with urinary frequency urinate at least 8 times in 24 hours, even if they drink a normal amount of fluid. Although they urinate more often than normal, the total amount of urine produced in a day may be normal. Urinary frequency is also called pollakiuria. What are the causes? This condition may be caused by:  A urinary tract infection.  Obesity.  Bladder problems, such as bladder stones.  Caffeine or alcohol.  Eating food or drinking fluids that irritate the bladder. These include coffee, tea, soda, artificial sweeteners, citrus, tomato-based foods, and chocolate.  Certain medicines, such as medicines that help the body get rid of extra fluid (diuretics).  Muscle or nerve weakness.  Overactive bladder.  Chronic diabetes.  Interstitial cystitis.  In men, problems with the prostate, such as an enlarged prostate.  In women, pregnancy. In some cases, the cause may not be known. What increases the risk? This condition is more likely to develop in:  Women who have gone through menopause.  Men with prostate problems.  People with a disease or injury that affects the nerves or spinal cord.  People who have or have had a condition that affects the brain, such as a stroke. What are the signs or symptoms? Symptoms of this condition include:  Feeling an urgent need to urinate often. The stress and anxiety of needing to find a bathroom quickly can make this urge worse.  Urinating 8 or more times in 24 hours.  Urinating as often as every 1 to 2 hours. How is this diagnosed? This condition is diagnosed based on your symptoms, your medical history, and a physical exam. You may have tests, such as:  Blood tests.  Urine tests.  Imaging tests, such as X-rays or ultrasounds.  A bladder test.  A test of your neurological system. This is the body system that senses the need to urinate.  A  test to check for problems in the urethra and bladder called cystoscopy. You may also be asked to keep a bladder diary. A bladder diary is a record of what you eat and drink, how often you urinate, and how much you urinate. You may need to see a health care provider who specializes in conditions of the urinary tract (urologist) or kidneys (nephrologist). How is this treated? Treatment for this condition depends on the cause. Sometimes the condition goes away on its own and treatment is not necessary. If treatment is needed, it may include:  Taking medicine.  Learning exercises that strengthen the muscles that help control urination.  Following a bladder training program. This may include:  Learning to delay going to the bathroom.  Double urinating (voiding). This helps if you are not completely emptying your bladder.  Scheduled voiding.  Making diet changes, such as:  Avoiding caffeine.  Drinking fewer fluids, especially alcohol.  Not drinking in the evening.  Not having foods or drinks that may irritate the bladder.  Eating foods that help prevent or ease constipation. Constipation can make this condition worse.  Having the nerves in your bladder stimulated. There are two options for stimulating the nerves to your bladder:  Outpatient electrical nerve stimulation. This is done by your health care provider.  Surgery to implant a bladder pacemaker. The pacemaker helps to control the urge to urinate. Follow these instructions at home:  Keep a bladder diary if told to by your health care provider.  Take over-the-counter and prescription medicines only as   told by your health care provider.  Do any exercises as told by your health care provider.  Follow a bladder training program as told by your health care provider.  Make any recommended diet changes.  Keep all follow-up visits as told by your health care provider. This is important. Contact a health care provider  if:  You start urinating more often.  You feel pain or irritation when you urinate.  You notice blood in your urine.  Your urine looks cloudy.  You develop a fever.  You begin vomiting. Get help right away if:  You are unable to urinate. This information is not intended to replace advice given to you by your health care provider. Make sure you discuss any questions you have with your health care provider. Document Released: 07/23/2009 Document Revised: 10/28/2015 Document Reviewed: 04/22/2015 Elsevier Interactive Patient Education  2017 [employer] Inc[employer]  

## 2017-01-24 NOTE — Progress Notes (Signed)
   Subjective:    Patient ID: Judith Thomas, female    DOB: 09/09/1956, 61 y.o.   MRN: [id number]  Dysuria   The current episode started in the past 7 days. The problem occurs intermittently. The problem has been waxing and waning. The quality of the pain is described as burning. The pain is at a severity of 3/10. The pain is mild. There has been no fever. Associated symptoms include urgency. Pertinent negatives include no flank pain, frequency, hematuria, hesitancy, nausea or vomiting. She has tried increased fluids for the symptoms. The treatment provided mild relief.      Review of Systems  Gastrointestinal: Negative for nausea and vomiting.  Genitourinary: Positive for dysuria and urgency. Negative for flank pain, frequency, hematuria and hesitancy.  All other systems reviewed and are negative.      Objective:   Physical Exam  Constitutional: She is oriented to person, place, and time. She appears well-developed and well-nourished. No distress.  HENT:  Head: Normocephalic.  Cardiovascular: Normal rate, regular rhythm, normal heart sounds and intact distal pulses.   No murmur heard. Pulmonary/Chest: Effort normal and breath sounds normal. No respiratory distress. She has no wheezes.  Abdominal: Soft. Bowel sounds are normal. She exhibits no distension. There is no tenderness.  Musculoskeletal: Normal range of motion. She exhibits no edema or tenderness.  Neurological: She is alert and oriented to person, place, and time.  Skin: Skin is warm and dry.  Psychiatric: She has a normal mood and affect. Her behavior is normal. Judgment and thought content normal.  Vitals reviewed.     BP 136/78   Pulse 65   Temp 97.1 F (36.2 C)   Ht 5\' 5"  (1.651 m)   Wt 190 lb (86.2 kg)   BMI 31.62 kg/m      Assessment & Plan:  1. Dysuria - Urinalysis, Complete  2. Acute cystitis with hematuria Force fluids AZO over the counter X2 days RTO prn Culture pending - nitrofurantoin,  macrocrystal-monohydrate, (MACROBID) 100 MG capsule; Take 1 capsule (100 mg total) by mouth 2 (two) times daily.  Dispense: 10 capsule; Refill: 0 - Urine culture   Evelina Dun, FNP

## 2017-01-26 LAB — URINE CULTURE

## 2017-02-20 ENCOUNTER — Ambulatory Visit (INDEPENDENT_AMBULATORY_CARE_PROVIDER_SITE_OTHER): Payer: BLUE CROSS/BLUE SHIELD | Admitting: Nurse Practitioner

## 2017-02-20 ENCOUNTER — Encounter: Payer: Self-pay | Admitting: Nurse Practitioner

## 2017-02-20 VITALS — BP 139/83 | HR 60 | Temp 97.2°F | Ht 65.0 in | Wt 187.0 lb

## 2017-02-20 DIAGNOSIS — W57XXXA Bitten or stung by nonvenomous insect and other nonvenomous arthropods, initial encounter: Secondary | ICD-10-CM

## 2017-02-20 DIAGNOSIS — S80921A Unspecified superficial injury of right lower leg, initial encounter: Secondary | ICD-10-CM | POA: Diagnosis not present

## 2017-02-20 MED ORDER — DOXYCYCLINE HYCLATE 100 MG PO TABS: 100.0000 mg | ORAL_TABLET | Freq: Two times a day (BID) | ORAL | 0 refills | Status: DC

## 2017-02-20 NOTE — Progress Notes (Signed)
   Subjective:    Patient ID: Judith Thomas, female    DOB: 03-30-1956, 61 y.o.   MRN: [id number]  HPI Patient comes into the office for a tick bite that took place 2 weeks ago.  Tick found after doing yardwork.  Patient noticed tick after getting out of the shower the morning after working in the yard.  Patient states the bite has been itching, but about 3 days ago he noticed the spot was warm to touch and there was a red ring around where she had pulled the tick off.     Review of Systems  Constitutional: Negative for activity change, appetite change and fatigue.  Respiratory: Negative for cough and shortness of breath.   Cardiovascular: Negative for chest pain.  Gastrointestinal: Negative for abdominal distention and abdominal pain.  Musculoskeletal: Negative for neck pain and neck stiffness.  Skin: Positive for rash (x3 days on the right shin).  All other systems reviewed and are negative.      Objective:   Physical Exam  Constitutional: She is oriented to person, place, and time. She appears well-developed and well-nourished.  HENT:  Head: Normocephalic.  Mouth/Throat: Oropharynx is clear and moist.  Eyes: Pupils are equal, round, and reactive to light.  Neck: Normal range of motion. Neck supple.  Cardiovascular: Normal rate, regular rhythm and normal heart sounds.   Pulmonary/Chest: Effort normal and breath sounds normal.  Abdominal: Soft. Bowel sounds are normal.  Musculoskeletal: Normal range of motion.  Neurological: She is alert and oriented to person, place, and time.  Skin: Skin is warm and dry.  3cm erythematous annular lesion with circular clearing on right shin-where she removed tick  Psychiatric: She has a normal mood and affect. Her behavior is normal. Judgment and thought content normal.    BP 139/83   Pulse 60   Temp 97.2 F (36.2 C) (Oral)   Ht 5\' 5"  (1.651 m)   Wt 187 lb (84.8 kg)   BMI 31.12 kg/m       Assessment & Plan:   1. Tick bite, initial  encounter    Meds ordered this encounter  Medications  . doxycycline (VIBRA-TABS) 100 MG tablet    Sig: Take 1 tablet (100 mg total) by mouth 2 (two) times daily. 1 po bid    Dispense:  28 tablet    Refill:  0    Order Specific Question:   Supervising Provider    Answer:   Eustaquio Maize [[id number]]   Avoid scratching Refused lyme test If any other symptoms develop RTO  [name] Hassell Done, FNP

## 2017-02-20 NOTE — Patient Instructions (Signed)
Tick Bite Information Introduction Ticks are insects that attach themselves to the skin. There are many types of ticks. Common types include wood ticks and deer ticks. Sometimes, ticks carry diseases that can make a person very ill. The most common places for ticks to attach themselves are the scalp, neck, armpits, waist, and groin. HOW CAN YOU PREVENT TICK BITES? Take these steps to help prevent tick bites when you are outdoors:  Wear long sleeves and long pants.  Wear white clothes so you can see ticks more easily.  Tuck your pant legs into your socks.  If walking on a trail, stay in the middle of the trail to avoid brushing against bushes.  Avoid walking through areas with long grass.  Put bug spray on all skin that is showing and along boot tops, pant legs, and sleeve cuffs.  Check clothes, hair, and skin often and before going inside.  Brush off any ticks that are not attached.  Take a shower or bath as soon as possible after being outdoors. HOW SHOULD YOU REMOVE A TICK? Ticks should be removed as soon as possible to help prevent diseases. 1. If latex gloves are available, put them on before trying to remove a tick. 2. Use tweezers to grasp the tick as close to the skin as possible. You may also use curved forceps or a tick removal tool. Grasp the tick as close to its head as possible. Avoid grasping the tick on its body. 3. Pull gently upward until the tick lets go. Do not twist the tick or jerk it suddenly. This may break off the tick's head or mouth parts. 4. Do not squeeze or crush the tick's body. This could force disease-carrying fluids from the tick into your body. 5. After the tick is removed, wash the bite area and your hands with soap and water or alcohol. 6. Apply a small amount of antiseptic cream or ointment to the bite site. 7. Wash any tools that were used. Do not try to remove a tick by applying a hot match, petroleum jelly, or fingernail polish to the tick. These  methods do not work. They may also increase the chances of disease being spread from the tick bite. WHEN SHOULD YOU SEEK HELP? Contact your health care provider if you are unable to remove a tick or if a part of the tick breaks off in the skin. After a tick bite, you need to watch for signs and symptoms of diseases that can be spread by ticks. Contact your health care provider if you develop any of the following:  Fever.  Rash.  Redness and puffiness (swelling) in the area of the tick bite.  Tender, puffy lymph glands.  Watery poop (diarrhea).  Weight loss.  Cough.  Feeling more tired than normal (fatigue).  Muscle, joint, or bone pain.  Belly (abdominal) pain.  Headache.  Change in your level of consciousness.  Trouble walking or moving your legs.  Loss of feeling (numbness) in the legs.  Loss of movement (paralysis).  Shortness of breath.  Confusion.  Throwing up (vomiting) many times. This information is not intended to replace advice given to you by your health care provider. Make sure you discuss any questions you have with your health care provider. Document Released: 12/21/2009 Document Revised: 03/03/2016 Document Reviewed: 03/06/2013 Elsevier Interactive Patient Education  2017 Elsevier Inc.  

## 2017-07-21 ENCOUNTER — Ambulatory Visit (INDEPENDENT_AMBULATORY_CARE_PROVIDER_SITE_OTHER): Payer: BLUE CROSS/BLUE SHIELD | Admitting: Nurse Practitioner

## 2017-07-21 ENCOUNTER — Encounter: Payer: Self-pay | Admitting: Nurse Practitioner

## 2017-07-21 VITALS — BP 157/82 | HR 63 | Temp 97.2°F | Ht 64.0 in | Wt 185.0 lb

## 2017-07-21 DIAGNOSIS — Z23 Encounter for immunization: Secondary | ICD-10-CM

## 2017-07-21 DIAGNOSIS — Z0000 Encounter for general adult medical examination without abnormal findings: Secondary | ICD-10-CM | POA: Diagnosis not present

## 2017-07-21 DIAGNOSIS — Z6828 Body mass index (BMI) 28.0-28.9, adult: Secondary | ICD-10-CM

## 2017-07-21 NOTE — Progress Notes (Signed)
   Subjective:    Patient ID: Judith Thomas, female    DOB: April 18, 1956, 61 y.o.   MRN: [id number]  HPI Patient comes in today for annual physical exam. SHe is doing well today without complaints. She has no current medical problems and is on no meds.  BP Readings from Last 3 Encounters:  07/21/17 (!) 157/82  02/20/17 139/83  01/24/17 136/78     Review of Systems  Constitutional: Negative for activity change and appetite change.  HENT: Negative.   Eyes: Negative for pain.  Respiratory: Negative for shortness of breath.   Cardiovascular: Negative for chest pain, palpitations and leg swelling.  Gastrointestinal: Negative for abdominal pain.  Endocrine: Negative for polydipsia.  Genitourinary: Negative.   Skin: Negative for rash.  Neurological: Negative for dizziness, weakness and headaches.  Hematological: Does not bruise/bleed easily.  Psychiatric/Behavioral: Negative.   All other systems reviewed and are negative.      Objective:   Physical Exam  Constitutional: She is oriented to person, place, and time. She appears well-developed and well-nourished.  HENT:  Nose: Nose normal.  Mouth/Throat: Oropharynx is clear and moist.  Eyes: EOM are normal.  Neck: Trachea normal, normal range of motion and full passive range of motion without pain. Neck supple. No JVD present. Carotid bruit is not present. No thyromegaly present.  Cardiovascular: Normal rate, regular rhythm, normal heart sounds and intact distal pulses.  Exam reveals no gallop and no friction rub.   No murmur heard. Pulmonary/Chest: Effort normal and breath sounds normal.  Abdominal: Soft. Bowel sounds are normal. She exhibits no distension and no mass. There is no tenderness.  Musculoskeletal: Normal range of motion.  Lymphadenopathy:    She has no cervical adenopathy.  Neurological: She is alert and oriented to person, place, and time. She has normal reflexes.  Skin: Skin is warm and dry.  Psychiatric: She has a  normal mood and affect. Her behavior is normal. Judgment and thought content normal.   BP (!) 157/82   Pulse 63   Temp (!) 97.2 F (36.2 C) (Oral)   Ht '5\' 4"'$  (1.626 m)   Wt 185 lb (83.9 kg)   BMI 31.76 kg/m         Assessment & Plan:  1. Annual physical exam - CBC with Differential/Platelet - CMP14+EGFR - Lipid panel - Thyroid Panel With TSH  2. BMI 28.0-28.9,adult Discussed diet and exercise for person with BMI >25 Will recheck weight in 3-6 months    Labs pending Health maintenance reviewed Diet and exercise encouraged Continue all meds Follow up  In 1year   Brook Highland, FNP

## 2017-07-21 NOTE — Patient Instructions (Signed)

## 2017-07-23 LAB — CBC WITH DIFFERENTIAL/PLATELET
Basophils Absolute: 0 10*3/uL (ref 0.0–0.2)
Basos: 0 %
EOS (ABSOLUTE): 0.1 10*3/uL (ref 0.0–0.4)
Eos: 2 %
Hematocrit: 36.7 % (ref 34.0–46.6)
Hemoglobin: 12.3 g/dL (ref 11.1–15.9)
Immature Grans (Abs): 0 10*3/uL (ref 0.0–0.1)
Immature Granulocytes: 0 %
LYMPHS: 27 %
Lymphocytes Absolute: 1.3 10*3/uL (ref 0.7–3.1)
MCH: 31 pg (ref 26.6–33.0)
MCHC: 33.5 g/dL (ref 31.5–35.7)
MCV: 92 fL (ref 79–97)
MONOS ABS: 0.3 10*3/uL (ref 0.1–0.9)
Monocytes: 7 %
NEUTROS ABS: 3 10*3/uL (ref 1.4–7.0)
Neutrophils: 64 %
Platelets: 190 10*3/uL (ref 150–379)
RBC: 3.97 x10E6/uL (ref 3.77–5.28)
RDW: 14.1 % (ref 12.3–15.4)
WBC: 4.7 10*3/uL (ref 3.4–10.8)

## 2017-07-23 LAB — CMP14+EGFR
ALBUMIN: 4.3 g/dL (ref 3.6–4.8)
ALT: 25 IU/L (ref 0–32)
AST: 19 IU/L (ref 0–40)
Albumin/Globulin Ratio: 1.5 (ref 1.2–2.2)
Alkaline Phosphatase: 109 IU/L (ref 39–117)
BUN / CREAT RATIO: 14 (ref 12–28)
BUN: 10 mg/dL (ref 8–27)
Bilirubin Total: 0.7 mg/dL (ref 0.0–1.2)
CALCIUM: 9.2 mg/dL (ref 8.7–10.3)
CO2: 25 mmol/L (ref 20–29)
Chloride: 104 mmol/L (ref 96–106)
Creatinine, Ser: 0.73 mg/dL (ref 0.57–1.00)
GFR, EST AFRICAN AMERICAN: 104 mL/min/{1.73_m2} (ref >59)
GFR, EST NON AFRICAN AMERICAN: 90 mL/min/{1.73_m2} (ref >59)
Globulin, Total: 2.9 g/dL (ref 1.5–4.5)
Glucose: 85 mg/dL (ref 65–99)
Potassium: 4.4 mmol/L (ref 3.5–5.2)
Sodium: 143 mmol/L (ref 134–144)
Total Protein: 7.2 g/dL (ref 6.0–8.5)

## 2017-07-23 LAB — LIPID PANEL
CHOL/HDL RATIO: 2.6 ratio (ref 0.0–4.4)
Cholesterol, Total: 134 mg/dL (ref 100–199)
HDL: 52 mg/dL (ref >39)
LDL Calculated: 66 mg/dL (ref 0–99)
Triglycerides: 82 mg/dL (ref 0–149)
VLDL CHOLESTEROL CAL: 16 mg/dL (ref 5–40)

## 2017-07-23 LAB — THYROID PANEL WITH TSH
Free Thyroxine Index: 2 (ref 1.2–4.9)
T3 UPTAKE RATIO: 28 % (ref 24–39)
T4, Total: 7 ug/dL (ref 4.5–12.0)
TSH: 1.26 u[IU]/mL (ref 0.450–4.500)

## 2017-07-31 ENCOUNTER — Telehealth: Payer: Self-pay | Admitting: Nurse Practitioner

## 2017-08-01 NOTE — Telephone Encounter (Signed)
Advised patient that form was mailed and that it takes a little longer because it goes through our [employer] system. Patient advised that is she hasn't received by the end of the week she will let us know.

## 2017-09-15 DIAGNOSIS — Z1231 Encounter for screening mammogram for malignant neoplasm of breast: Secondary | ICD-10-CM | POA: Diagnosis not present

## 2017-11-10 DIAGNOSIS — Z1283 Encounter for screening for malignant neoplasm of skin: Secondary | ICD-10-CM | POA: Diagnosis not present

## 2017-11-10 DIAGNOSIS — X32XXXA Exposure to sunlight, initial encounter: Secondary | ICD-10-CM | POA: Diagnosis not present

## 2017-11-10 DIAGNOSIS — L821 Other seborrheic keratosis: Secondary | ICD-10-CM | POA: Diagnosis not present

## 2017-11-10 DIAGNOSIS — B078 Other viral warts: Secondary | ICD-10-CM | POA: Diagnosis not present

## 2017-11-10 DIAGNOSIS — L820 Inflamed seborrheic keratosis: Secondary | ICD-10-CM | POA: Diagnosis not present

## 2017-11-10 DIAGNOSIS — L570 Actinic keratosis: Secondary | ICD-10-CM | POA: Diagnosis not present

## 2018-01-18 ENCOUNTER — Encounter: Payer: Self-pay | Admitting: Pediatrics

## 2018-01-18 ENCOUNTER — Ambulatory Visit: Payer: BLUE CROSS/BLUE SHIELD | Admitting: Pediatrics

## 2018-01-18 VITALS — BP 130/81 | HR 72 | Temp 97.3°F | Ht 64.0 in | Wt 191.0 lb

## 2018-01-18 DIAGNOSIS — N3090 Cystitis, unspecified without hematuria: Secondary | ICD-10-CM | POA: Diagnosis not present

## 2018-01-18 DIAGNOSIS — R399 Unspecified symptoms and signs involving the genitourinary system: Secondary | ICD-10-CM

## 2018-01-18 LAB — URINALYSIS, COMPLETE
Bilirubin, UA: NEGATIVE
Glucose, UA: NEGATIVE
Ketones, UA: NEGATIVE
Nitrite, UA: NEGATIVE
Specific Gravity, UA: 1.01 (ref 1.005–1.030)
Urobilinogen, Ur: 0.2 mg/dL (ref 0.2–1.0)
pH, UA: 6.5 (ref 5.0–7.5)

## 2018-01-18 LAB — MICROSCOPIC EXAMINATION
RBC, UA: >30 /HPF — AB (ref 0–2)
Renal Epithel, UA: NONE SEEN /HPF
WBC, UA: >30 /HPF — AB (ref 0–5)

## 2018-01-18 MED ORDER — NITROFURANTOIN MONOHYD MACRO 100 MG PO CAPS: 100.0000 mg | ORAL_CAPSULE | Freq: Two times a day (BID) | ORAL | 0 refills | Status: AC

## 2018-01-18 NOTE — Progress Notes (Signed)
  Subjective:   Patient ID: Judith Thomas, female    DOB: 1956/05/28, 62 y.o.   MRN: [id number] CC: Back Pain; Painful Urination; and Abnormal Urine color  HPI: Judith Thomas is a 62 y.o. female presenting for Back Pain; Painful Urination; and Abnormal Urine color  Started having symptoms last night.  Feels like when her urinary tract infections have felt like in the past.  Every void has had some burning.  Small amount of stomach pain.  Appetite is been okay.  No fevers.  Has been sometime since her last UTI.  Relevant past medical, surgical, family and social history reviewed. Allergies and medications reviewed and updated. Social History   Tobacco Use  Smoking Status Never Smoker  Smokeless Tobacco Never Used   ROS: Per HPI   Objective:    BP 130/81   Pulse 72   Temp (!) 97.3 F (36.3 C) (Oral)   Ht 5\' 4"  (1.626 m)   Wt 191 lb (86.6 kg)   BMI 32.79 kg/m   Wt Readings from Last 3 Encounters:  01/18/18 191 lb (86.6 kg)  07/21/17 185 lb (83.9 kg)  02/20/17 187 lb (84.8 kg)    Gen: NAD, alert, cooperative with exam, NCAT EYES: EOMI, no conjunctival injection, or no icterus CV: NRRR, normal S1/S2, no murmur, distal pulses 2+ b/l Resp: CTABL, no wheezes, normal WOB Abd: +BS, soft, NTND. no guarding or organomegaly, no CVA tenderness Ext: No edema, warm Neuro: Alert and oriented, strength equal b/l UE and LE, coordination grossly normal MSK: normal muscle bulk  Assessment & Plan:  Judith Thomas was seen today for back pain, painful urination and abnormal urine color.  Diagnoses and all orders for this visit:  Cystitis UA positive, treat with below, follow-up urine culture. -     nitrofurantoin, macrocrystal-monohydrate, (MACROBID) 100 MG capsule; Take 1 capsule (100 mg total) by mouth 2 (two) times daily for 5 days.  UTI symptoms -     Urinalysis, Complete -     Urine Culture; Future -     Urine Culture   Follow up plan: Return if symptoms worsen or fail to  improve. Assunta Found, MD [place]

## 2018-01-20 LAB — URINE CULTURE

## 2018-08-15 ENCOUNTER — Ambulatory Visit (INDEPENDENT_AMBULATORY_CARE_PROVIDER_SITE_OTHER): Payer: BLUE CROSS/BLUE SHIELD

## 2018-08-15 ENCOUNTER — Encounter: Payer: Self-pay | Admitting: Nurse Practitioner

## 2018-08-15 ENCOUNTER — Ambulatory Visit (INDEPENDENT_AMBULATORY_CARE_PROVIDER_SITE_OTHER): Payer: BLUE CROSS/BLUE SHIELD | Admitting: Nurse Practitioner

## 2018-08-15 VITALS — BP 123/76 | HR 67 | Temp 97.4°F | Ht 65.0 in | Wt 182.0 lb

## 2018-08-15 DIAGNOSIS — Z6830 Body mass index (BMI) 30.0-30.9, adult: Secondary | ICD-10-CM

## 2018-08-15 DIAGNOSIS — Z23 Encounter for immunization: Secondary | ICD-10-CM | POA: Diagnosis not present

## 2018-08-15 DIAGNOSIS — Z0000 Encounter for general adult medical examination without abnormal findings: Secondary | ICD-10-CM

## 2018-08-15 NOTE — Progress Notes (Signed)
   Subjective:    Patient ID: Judith Thomas, female    DOB: Jul 04, 1956, 62 y.o.   MRN: [id number]   Chief Complaint: Annual Exam   HPI:  1. Annual physical exam  Patient in today for yearly exam. She has no current medical problems and is on no chronic meds.  2. BMI 32.0-32.9,adult  Weight is down 9lbs since last visit      New complaints: None today  Social history: Retired at end of march from a [employer] center.   Review of Systems  Constitutional: Negative for activity change and appetite change.  HENT: Negative.   Eyes: Negative for pain.  Respiratory: Negative for shortness of breath.   Cardiovascular: Negative for chest pain, palpitations and leg swelling.  Gastrointestinal: Negative for abdominal pain.  Endocrine: Negative for polydipsia.  Genitourinary: Negative.   Skin: Negative for rash.  Neurological: Negative for dizziness, weakness and headaches.  Hematological: Does not bruise/bleed easily.  Psychiatric/Behavioral: Negative.   All other systems reviewed and are negative.      Objective:   Physical Exam  Constitutional: She is oriented to person, place, and time. She appears well-developed and well-nourished. No distress.  HENT:  Head: Normocephalic.  Nose: Nose normal.  Mouth/Throat: Oropharynx is clear and moist.  Eyes: Pupils are equal, round, and reactive to light. EOM are normal.  Neck: Normal range of motion. Neck supple. No JVD present. Carotid bruit is not present.  Cardiovascular: Normal rate, regular rhythm, normal heart sounds and intact distal pulses.  Pulmonary/Chest: Effort normal and breath sounds normal. No respiratory distress. She has no wheezes. She has no rales. She exhibits no tenderness.  Abdominal: Soft. Normal appearance, normal aorta and bowel sounds are normal. She exhibits no distension, no abdominal bruit, no pulsatile midline mass and no mass. There is no splenomegaly or hepatomegaly. There is no tenderness.    Musculoskeletal: Normal range of motion. She exhibits no edema.  Lymphadenopathy:    She has no cervical adenopathy.  Neurological: She is alert and oriented to person, place, and time. She has normal reflexes.  Skin: Skin is warm and dry.  Psychiatric: She has a normal mood and affect. Her behavior is normal. Judgment and thought content normal.  Nursing note and vitals reviewed.  BP 123/76   Pulse 67   Temp (!) 97.4 F (36.3 C) (Oral)   Ht '5\' 5"'$  (1.651 m)   Wt 182 lb (82.6 kg)   BMI 30.29 kg/m   EKG- NSR Chest xray- no acute or chronic cardiopulmonary issues-Preliminary reading by Judith Collum, FNP  [facility]      Assessment & Plan:  Judith Thomas comes in today with chief complaint of Annual Exam   Diagnosis and orders addressed:  1. Annual physical exam - CBC with Differential/Platelet - CMP14+EGFR - Lipid panel - Thyroid Panel With TSH - DG Chest 2 View - EKG 12-Lead  2. BMI 30.0-30.9,adult Discussed diet and exercise for person with BMI >25 Will recheck weight in 3-6 months   Labs pending Health Maintenance reviewed Diet and exercise encouraged  Follow up plan: 1 year and prn   Judith Hassell Done, FNP

## 2018-08-15 NOTE — Patient Instructions (Signed)

## 2018-08-16 LAB — CBC WITH DIFFERENTIAL/PLATELET
BASOS: 1 %
Basophils Absolute: 0 10*3/uL (ref 0.0–0.2)
EOS (ABSOLUTE): 0.1 10*3/uL (ref 0.0–0.4)
EOS: 2 %
HEMATOCRIT: 38 % (ref 34.0–46.6)
Hemoglobin: 12.9 g/dL (ref 11.1–15.9)
IMMATURE GRANS (ABS): 0 10*3/uL (ref 0.0–0.1)
IMMATURE GRANULOCYTES: 0 %
LYMPHS: 27 %
Lymphocytes Absolute: 1.4 10*3/uL (ref 0.7–3.1)
MCH: 30.6 pg (ref 26.6–33.0)
MCHC: 33.9 g/dL (ref 31.5–35.7)
MCV: 90 fL (ref 79–97)
MONOCYTES: 8 %
MONOS ABS: 0.4 10*3/uL (ref 0.1–0.9)
NEUTROS PCT: 62 %
Neutrophils Absolute: 3.1 10*3/uL (ref 1.4–7.0)
Platelets: 219 10*3/uL (ref 150–450)
RBC: 4.22 x10E6/uL (ref 3.77–5.28)
RDW: 12.4 % (ref 12.3–15.4)
WBC: 5 10*3/uL (ref 3.4–10.8)

## 2018-08-16 LAB — LIPID PANEL
Chol/HDL Ratio: 2.4 ratio (ref 0.0–4.4)
Cholesterol, Total: 118 mg/dL (ref 100–199)
HDL: 50 mg/dL (ref >39)
LDL CALC: 54 mg/dL (ref 0–99)
TRIGLYCERIDES: 68 mg/dL (ref 0–149)
VLDL CHOLESTEROL CAL: 14 mg/dL (ref 5–40)

## 2018-08-16 LAB — CMP14+EGFR
ALBUMIN: 4.5 g/dL (ref 3.6–4.8)
ALK PHOS: 105 IU/L (ref 39–117)
ALT: 12 IU/L (ref 0–32)
AST: 18 IU/L (ref 0–40)
Albumin/Globulin Ratio: 1.7 (ref 1.2–2.2)
BUN / CREAT RATIO: 18 (ref 12–28)
BUN: 14 mg/dL (ref 8–27)
Bilirubin Total: 0.6 mg/dL (ref 0.0–1.2)
CALCIUM: 9.5 mg/dL (ref 8.7–10.3)
CO2: 19 mmol/L — AB (ref 20–29)
CREATININE: 0.8 mg/dL (ref 0.57–1.00)
Chloride: 103 mmol/L (ref 96–106)
GFR calc Af Amer: 92 mL/min/{1.73_m2} (ref >59)
GFR, EST NON AFRICAN AMERICAN: 80 mL/min/{1.73_m2} (ref >59)
GLUCOSE: 93 mg/dL (ref 65–99)
Globulin, Total: 2.7 g/dL (ref 1.5–4.5)
Potassium: 4.8 mmol/L (ref 3.5–5.2)
Sodium: 142 mmol/L (ref 134–144)
TOTAL PROTEIN: 7.2 g/dL (ref 6.0–8.5)

## 2018-08-16 LAB — THYROID PANEL WITH TSH
Free Thyroxine Index: 2.1 (ref 1.2–4.9)
T3 Uptake Ratio: 30 % (ref 24–39)
T4, Total: 6.9 ug/dL (ref 4.5–12.0)
TSH: 1.64 u[IU]/mL (ref 0.450–4.500)

## 2018-09-17 DIAGNOSIS — Z1231 Encounter for screening mammogram for malignant neoplasm of breast: Secondary | ICD-10-CM | POA: Diagnosis not present

## 2018-09-19 DIAGNOSIS — N6312 Unspecified lump in the right breast, upper inner quadrant: Secondary | ICD-10-CM | POA: Diagnosis not present

## 2018-09-19 DIAGNOSIS — N6315 Unspecified lump in the right breast, overlapping quadrants: Secondary | ICD-10-CM | POA: Diagnosis not present

## 2018-09-26 ENCOUNTER — Other Ambulatory Visit: Payer: Self-pay | Admitting: Radiology

## 2018-09-26 DIAGNOSIS — N6312 Unspecified lump in the right breast, upper inner quadrant: Secondary | ICD-10-CM | POA: Diagnosis not present

## 2018-09-26 DIAGNOSIS — C50211 Malignant neoplasm of upper-inner quadrant of right female breast: Secondary | ICD-10-CM | POA: Diagnosis not present

## 2018-09-26 LAB — HM MAMMOGRAPHY

## 2018-10-18 ENCOUNTER — Ambulatory Visit: Payer: Self-pay | Admitting: General Surgery

## 2018-10-18 DIAGNOSIS — Z170 Estrogen receptor positive status [ER+]: Secondary | ICD-10-CM | POA: Diagnosis not present

## 2018-10-18 DIAGNOSIS — C50211 Malignant neoplasm of upper-inner quadrant of right female breast: Secondary | ICD-10-CM | POA: Diagnosis not present

## 2018-10-19 ENCOUNTER — Other Ambulatory Visit: Payer: Self-pay | Admitting: General Surgery

## 2018-10-19 DIAGNOSIS — C50211 Malignant neoplasm of upper-inner quadrant of right female breast: Secondary | ICD-10-CM

## 2018-10-19 DIAGNOSIS — Z171 Estrogen receptor negative status [ER-]: Principal | ICD-10-CM

## 2018-10-22 ENCOUNTER — Telehealth: Payer: Self-pay | Admitting: Hematology and Oncology

## 2018-10-22 NOTE — Progress Notes (Signed)
Location of Breast Cancer: Right Breast  Histology per Pathology Report:  09/26/18 Diagnosis 1. Breast, right, needle core biopsy, 12:30 o'clock, 5 cm fn - INVASIVE DUCTAL CARCINOMA, GRADE II/III. - SEE MICROSCOPIC DESCRIPTION.  Receptor Status: ER (70%), PR (NEG), Her2-neu (POS), Ki (20%)  2. Breast, right, needle core biopsy, 1:30 o'clock, 6 cm fn - INVASIVE DUCTAL CARCINOMA, GRADE II/III. - SEE MICROSCOPIC DESCRIPTION.  Receptor Status: ER(100%), PR (20%), Her2-neu (POS), Ki-(10%)  Did patient present with symptoms or was this found on screening mammography?: It was found on a screening mammogram.   Past/Anticipated interventions by surgeon, if any: Dr. [name] [facility] insertion planned for 11/07/18  Past/Anticipated interventions by medical oncology, if any:  10/24/18 Dr. Lindi Adie Recommendation based on multidisciplinary tumor board: 1. Neoadjuvant chemotherapy with Taxol Herceptin weekly x12 followed by Herceptin maintenance vs Kadcyla for 1 year 2. Followed by breast conserving surgery if possible with sentinel lymph node study 3. Followed by adjuvant radiation therapy if patient had lumpectomy 4.  Followed by antiestrogen therapy  Lymphedema issues, if any:  N/A  Pain issues, if any:  She denies  SAFETY ISSUES:  Prior radiation? No  Pacemaker/ICD? No  Possible current pregnancy? No  Is the patient on methotrexate? No  Current Complaints / other details:   10/28/18 MRI bilateral Breasts.   BP (!) 146/90 (BP Location: Left Arm, Patient Position: Sitting)   Pulse 67   Temp 98.1 F (36.7 C) (Oral)   Resp 18   Ht _0  (1.651 m)   Wt 184 lb 2 oz (83.5 kg)   SpO2 99%   BMI 30.64 kg/m    Wt Readings from Last 3 Encounters:  10/26/18 184 lb 2 oz (83.5 kg)  10/24/18 184 lb (83.5 kg)  08/15/18 182 lb (82.6 kg)      Kimori Tartaglia, Stephani Police, RN 10/22/2018,9:38 AM

## 2018-10-22 NOTE — Telephone Encounter (Signed)
A new patient appt has been scheduled for the pt to see Dr. Lindi Adie on 1/15 at 4pm. Pt aware to arrive 30 minutes early. Letter mailed.

## 2018-10-23 NOTE — Progress Notes (Signed)
[facility] CONSULT NOTE  Patient Care Team: Chevis Pretty, FNP as PCP - General (Nurse Practitioner)  CHIEF COMPLAINTS/PURPOSE OF CONSULTATION: Newly diagnosed breast cancer  HISTORY OF PRESENTING ILLNESS:  Judith Thomas 63 y.o. female is here because of recent diagnosis of invasive ductal carcinoma of the right breast. The cancer was detected on a routine screening mammogram on 09/17/18 and was not palpable prior to diagnosis. A biopsy on 09/26/18 showed the mass at 12:30 o'clock 5cm fn to be grade 2, ER 70%, PR 0%, HER2 positive, Ki67 20% and the mass at 1:30 o'clock 6cm fn to be grade 2, ER 100%, PR 20%, HER2 positive, Ki67 10% and the cancer to be stage 1a. She has a family history of breast cancer in two of her maternal aunts at ages 58 and 59. Her port insertion is scheduled for 11/07/18 with Dr. [name] and a breast MRI is scheduled for 10/28/18.  She presents to the clinic today with her husband and daughter, who is a cardiac nurse here at [facility]. She was not interested in [place]. She reports she walks daily and bowls weekly and will continue through treatment. She noted she is interested in participation in the [employer] clinical trial. She recently retired in March of 2019.   I reviewed her records extensively and collaborated the history with the patient.  REVIEW OF SYSTEMS:   Constitutional: Denies fevers, chills or abnormal night sweats Eyes: Denies blurriness of vision, double vision or watery eyes Ears, nose, mouth, throat, and face: Denies mucositis or sore throat Respiratory: Denies cough, dyspnea or wheezes Cardiovascular: Denies palpitation, chest discomfort or lower extremity swelling Gastrointestinal:  Denies nausea, heartburn or change in bowel habits Skin: Denies abnormal skin rashes Lymphatics: Denies new lymphadenopathy or easy bruising Neurological: Denies numbness, tingling or new weaknesses Behavioral/Psych: Mood is stable, no new changes  Breast:  Denies any discharge All other systems were reviewed with the patient and are negative.  SUMMARY OF ONCOLOGIC HISTORY:   Malignant neoplasm of upper-inner quadrant of right breast in female, estrogen receptor positive ([place])   09/26/2018 Initial Diagnosis    Two right breast masses 12:00 to 1 o'clock position 1.3 cm and 1.2 cm, they are 1.5 cm apart.  Biopsy revealed grade 2 invasive ductal carcinoma ER 70%- 100%, PR 0% -20%, Ki-67 20%, HER-2 3+ by IHC and FISH ratio 2.15 with a gene copy number of 4.2 for the tumor that was 2+ by IHC, T1c N0 stage Ia    10/24/2018 Cancer Staging    Staging form: Breast, AJCC 8th Edition - Clinical stage from 10/24/2018: Stage IA (cT1c(2), cN0, cM0, G2, ER+, PR+, HER2+) - Signed by Nicholas Lose, MD on 10/24/2018    MEDICAL HISTORY:  No past medical history on file.  SURGICAL HISTORY: Past Surgical History:  Procedure Laterality Date  . COLONOSCOPY  03/2010  . none      SOCIAL HISTORY: Social History   Socioeconomic History  . Marital status: Married    Spouse name: Not on file  . Number of children: Not on file  . Years of education: Not on file  . Highest education level: Not on file  Occupational History  . Not on file  Social Needs  . Financial resource strain: Not on file  . Food insecurity:    Worry: Not on file    Inability: Not on file  . Transportation needs:    Medical: Not on file    Non-medical: Not on file  Tobacco  Use  . Smoking status: Never Smoker  . Smokeless tobacco: Never Used  Substance and Sexual Activity  . Alcohol use: Yes    Alcohol/week: 1.0 standard drinks    Types: 1 Standard drinks or equivalent per week  . Drug use: No  . Sexual activity: Not on file  Lifestyle  . Physical activity:    Days per week: Not on file    Minutes per session: Not on file  . Stress: Not on file  Relationships  . Social connections:    Talks on phone: Not on file    Gets together: Not on file    Attends religious service:  Not on file    Active member of club or organization: Not on file    Attends meetings of clubs or organizations: Not on file    Relationship status: Not on file  . Intimate partner violence:    Fear of current or ex partner: Not on file    Emotionally abused: Not on file    Physically abused: Not on file    Forced sexual activity: Not on file  Other Topics Concern  . Not on file  Social History Narrative  . Not on file    FAMILY HISTORY: Family History  Problem Relation Age of Onset  . Liver disease Mother   . Heart disease Mother        afib  . Heart disease Father   . Asthma Father   . Diabetes Father     ALLERGIES:  has No Known Allergies.  MEDICATIONS:  Current Outpatient Medications  Medication Sig Dispense Refill  . Multiple Vitamin (MULTIVITAMIN) tablet Take 1 tablet by mouth daily.     No current facility-administered medications for this visit.    PHYSICAL EXAMINATION: ECOG PERFORMANCE STATUS: 1 - Symptomatic but completely ambulatory  Vitals:   10/24/18 1555  BP: (!) 142/77  Pulse: 85  Resp: 18  Temp: 98.4 F (36.9 C)  SpO2: 100%   Filed Weights   10/24/18 1555  Weight: 184 lb (83.5 kg)    GENERAL: alert, no distress and comfortable SKIN: skin color, texture, turgor are normal, no rashes or significant lesions EYES: normal, conjunctiva are pink and non-injected, sclera clear OROPHARYNX: no exudate, no erythema and lips, buccal mucosa, and tongue normal  NECK: supple, thyroid normal size, non-tender, without nodularity LYMPH: no palpable lymphadenopathy in the cervical, axillary or inguinal LUNGS: clear to auscultation and percussion with normal breathing effort HEART: regular rate & rhythm and no murmurs and no lower extremity edema ABDOMEN: abdomen soft, non-tender and normal bowel sounds Musculoskeletal: no cyanosis of digits and no clubbing  PSYCH: alert & oriented x 3 with fluent speech NEURO: no focal motor/sensory deficits  LABORATORY  DATA:  I have reviewed the data as listed Lab Results  Component Value Date   WBC 5.0 08/15/2018   HGB 12.9 08/15/2018   HCT 38.0 08/15/2018   MCV 90 08/15/2018   PLT 219 08/15/2018   Lab Results  Component Value Date   NA 142 08/15/2018   K 4.8 08/15/2018   CL 103 08/15/2018   CO2 19 (L) 08/15/2018    RADIOGRAPHIC STUDIES: I have personally reviewed the radiological reports and agreed with the findings in the report.  ASSESSMENT AND PLAN:  Malignant neoplasm of upper-inner quadrant of right breast in female, estrogen receptor positive ([place]) 09/27/19: Two right breast masses 12:00 to 1 o'clock position 1.3 cm and 1.2 cm, they are 1.5 cm apart.  Biopsy  revealed grade 2 invasive ductal carcinoma ER 70%- 100%, PR 0% -20%, Ki-67 20%, HER-2 3+ by IHC and FISH ratio 2.15 with a gene copy number of 4.2 for the tumor that was 2+ by IHC, T1c N0 stage Ia  Pathology and radiology counseling: Discussed with the patient, the details of pathology including the type of breast cancer,the clinical staging, the significance of ER, PR and HER-2/neu receptors and the implications for treatment. After reviewing the pathology in detail, we proceeded to discuss the different treatment options between surgery, radiation, chemotherapy, antiestrogen therapies.  Recommendation based on multidisciplinary tumor board: 1. Neoadjuvant chemotherapy with Taxol Herceptin weekly x12 followed by Herceptin maintenance vs Kadcyla for 1 year 2. Followed by breast conserving surgery if possible with sentinel lymph node study 3. Followed by adjuvant radiation therapy if patient had lumpectomy 4.  Followed by antiestrogen therapy  Chemotherapy Counseling: I discussed the risks and benefits of chemotherapy including the risks of nausea/ vomiting, risk of infection from low WBC count, fatigue due to chemo or anemia, bruising or bleeding due to low platelets, mouth sores, loss/ change in taste and decreased appetite. Liver and  kidney function will be monitored through out chemotherapy as abnormalities in liver and kidney function may be a side effect of treatment. Cardiac dysfunction due to Herceptin and Perjeta were discussed in detail. Risk of permanent bone marrow dysfunction due to chemo were also discussed.  Plan: 1. Port placement 2. Echocardiogram 3. Chemotherapy class 4. Breast MRI  [employer] clinical trial was discussed with the patient and she is very interested in it. Her daughter is a cardiac nurse and wants to make sure her mother participates in the clinical study  Return to clinic in 2 weeks to start chemotherapy on 11/08/2018.     All questions were answered. The patient knows to call the clinic with any problems, questions or concerns.   Nicholas Lose, MD 10/24/2018   I, Cloyde Reams Dorshimer, am acting as scribe for Nicholas Lose, MD.  I have reviewed the above documentation for accuracy and completeness, and I agree with the above.

## 2018-10-24 ENCOUNTER — Other Ambulatory Visit: Payer: Self-pay

## 2018-10-24 ENCOUNTER — Inpatient Hospital Stay: Payer: BLUE CROSS/BLUE SHIELD | Attending: Hematology and Oncology | Admitting: Hematology and Oncology

## 2018-10-24 DIAGNOSIS — Z5112 Encounter for antineoplastic immunotherapy: Secondary | ICD-10-CM | POA: Diagnosis not present

## 2018-10-24 DIAGNOSIS — Z5111 Encounter for antineoplastic chemotherapy: Secondary | ICD-10-CM | POA: Diagnosis not present

## 2018-10-24 DIAGNOSIS — Z170 Estrogen receptor positive status [ER+]: Secondary | ICD-10-CM | POA: Diagnosis not present

## 2018-10-24 DIAGNOSIS — C50211 Malignant neoplasm of upper-inner quadrant of right female breast: Secondary | ICD-10-CM | POA: Diagnosis not present

## 2018-10-24 DIAGNOSIS — Z803 Family history of malignant neoplasm of breast: Secondary | ICD-10-CM | POA: Insufficient documentation

## 2018-10-24 NOTE — Assessment & Plan Note (Addendum)
09/27/19: Two right breast masses 12:00 to 1 o'clock position 1.3 cm and 1.2 cm, they are 1.5 cm apart.  Biopsy revealed grade 2 invasive ductal carcinoma ER 70%- 100%, PR 0% -20%, Ki-67 20%, HER-2 3+ by IHC and FISH ratio 2.15 with a gene copy number of 4.2 for the tumor that was 2+ by IHC, T1c N0 stage Ia  Pathology and radiology counseling: Discussed with the patient, the details of pathology including the type of breast cancer,the clinical staging, the significance of ER, PR and HER-2/neu receptors and the implications for treatment. After reviewing the pathology in detail, we proceeded to discuss the different treatment options between surgery, radiation, chemotherapy, antiestrogen therapies.  Recommendation based on multidisciplinary tumor board: 1. Neoadjuvant chemotherapy with Taxol Herceptin weekly x12 followed by Herceptin maintenance vs Kadcyla for 1 year 2. Followed by breast conserving surgery if possible with sentinel lymph node study 3. Followed by adjuvant radiation therapy if patient had lumpectomy 4.  Followed by antiestrogen therapy  Chemotherapy Counseling: I discussed the risks and benefits of chemotherapy including the risks of nausea/ vomiting, risk of infection from low WBC count, fatigue due to chemo or anemia, bruising or bleeding due to low platelets, mouth sores, loss/ change in taste and decreased appetite. Liver and kidney function will be monitored through out chemotherapy as abnormalities in liver and kidney function may be a side effect of treatment. Cardiac dysfunction due to Herceptin and Perjeta were discussed in detail. Risk of permanent bone marrow dysfunction due to chemo were also discussed.  Plan: 1. Port placement 2. Echocardiogram 3. Chemotherapy class 4. Breast MRI  [employer] clinical trial was discussed with the patient and she is very interested in it. Her daughter is a cardiac nurse and wants to make sure her mother participates in the clinical  study  Return to clinic in 2 weeks to start chemotherapy on 11/08/2018.

## 2018-10-26 ENCOUNTER — Other Ambulatory Visit: Payer: Self-pay

## 2018-10-26 ENCOUNTER — Encounter: Payer: Self-pay | Admitting: Radiation Oncology

## 2018-10-26 ENCOUNTER — Other Ambulatory Visit (HOSPITAL_COMMUNITY): Payer: Self-pay | Admitting: Hematology and Oncology

## 2018-10-26 ENCOUNTER — Ambulatory Visit
Admission: RE | Admit: 2018-10-26 | Discharge: 2018-10-26 | Disposition: A | Discharge status: Home / Self Care | Payer: BLUE CROSS/BLUE SHIELD | Source: Ambulatory Visit | Attending: Radiation Oncology | Admitting: Radiation Oncology

## 2018-10-26 VITALS — BP 146/90 | HR 67 | Temp 98.1°F | Resp 18 | Ht 65.0 in | Wt 184.1 lb

## 2018-10-26 DIAGNOSIS — C50919 Malignant neoplasm of unspecified site of unspecified female breast: Secondary | ICD-10-CM

## 2018-10-26 DIAGNOSIS — Z923 Personal history of irradiation: Secondary | ICD-10-CM | POA: Diagnosis not present

## 2018-10-26 DIAGNOSIS — C50211 Malignant neoplasm of upper-inner quadrant of right female breast: Secondary | ICD-10-CM | POA: Insufficient documentation

## 2018-10-26 DIAGNOSIS — Z170 Estrogen receptor positive status [ER+]: Secondary | ICD-10-CM | POA: Insufficient documentation

## 2018-10-26 DIAGNOSIS — Z9221 Personal history of antineoplastic chemotherapy: Secondary | ICD-10-CM | POA: Insufficient documentation

## 2018-10-26 NOTE — Progress Notes (Signed)
Radiation Oncology         [phone] ________________________________  Initial outpatient Consultation  Name: Judith Thomas MRN: [id number]  Date: 10/26/2018  DOB: [birth year]  GQ:[id number], [name], FNP  Jovita Kussmaul, MD   REFERRING PHYSICIAN: [name], MD  DIAGNOSIS:    ICD-10-CM   1. Malignant neoplasm of upper-inner quadrant of right breast in female, estrogen receptor positive ([place]) C50.211    Z17.0      Cancer Staging Malignant neoplasm of upper-inner quadrant of right breast in female, estrogen receptor positive ([place]) Staging form: Breast, AJCC 8th Edition - Clinical stage from 10/24/2018: Stage IA (cT1c(2), cN0, cM0, G2, ER+, PR+, HER2+) - Signed by Nicholas Lose, MD on 10/24/2018   CHIEF COMPLAINT: Here to discuss management of right breast cancer  HISTORY OF PRESENT ILLNESS::Judith Thomas is a 63 y.o. female who presented with breast abnormality on the following imaging: Screening Mammogram on the date of 09/17/2018 showed: The 1.2 cm irregular mass in the right breast at 12 o'clock middle depth is indeterminate. The 1.3 cm irregular high density mass in the right breast at 1-2 o'clock middle depth is indeterminate. Possible cluster of two 0.3 cm masses in the right breast at 12 o'clock middle depth is indeterminate. Symptoms, if any, at that time, were: there were no symptoms at the time. She denies palpitations, CP, increased anxiety, and any other symptoms.   Biopsy on date of 09/26/2018 showed Breast, right, needle core biopsy, 12:30 o'clock, 5 cm fn with: 1. Invasive ductal carcinoma, grade II/III. ER status: 70% positive, moderate staining intensity; PR status 0% negative, Her2 status positive (3+); Grade II. 2. Breast, right, needle core biopsy, 1:30 o'clock, 6 cm fn with invasive ductal carcinoma, grade II/III. ER status: 100% positive, strong staining intensity; PR status 20% positive strong staining intensity, Her2 status positive; Grade II.   She will  have a port placed on 11/07/2018 with Dr. [name]. She also followed up with Medical Oncologist, Dr. Lindi Adie on 10/24/2018 who recommended based on multidisciplinary tumor board: 1. Neoadjuvant chemotherapy withTaxol Herceptin weekly x20fllowed by Herceptin maintenance vsKadcylafor 1 year 2. Followed by breast conserving surgery if possible with sentinel lymph node study. 3. Followed by adjuvant radiation therapy if patient had lumpectomy. 4.Followed by antiestrogen therapy.    PREVIOUS RADIATION THERAPY: No  PAST MEDICAL HISTORY:  has no past medical history on file.    PAST SURGICAL HISTORY: Past Surgical History:  Procedure Laterality Date  . COLONOSCOPY  03/2010  . none      FAMILY HISTORY: family history includes Asthma in her father; Diabetes in her father; Heart disease in her father and mother; Liver disease in her mother.  SOCIAL HISTORY:  reports that she has never smoked. She has never used smokeless tobacco. She reports current alcohol use of about 1.0 standard drinks of alcohol per week. She reports that she does not use drugs.  ALLERGIES: Patient has no known allergies.  MEDICATIONS:  Current Outpatient Medications  Medication Sig Dispense Refill  . Multiple Vitamin (MULTIVITAMIN) tablet Take 1 tablet by mouth daily.     No current facility-administered medications for this encounter.     REVIEW OF SYSTEMS: A 10+ POINT REVIEW OF SYSTEMS WAS OBTAINED including neurology, dermatology, psychiatry, cardiac, respiratory, lymph, extremities, GI, GU, Musculoskeletal, constitutional, breasts, reproductive, HEENT.  All pertinent positives are noted in the HPI.  All others are negative.   PHYSICAL EXAM:  height is '5\' 5"'$  (1.651 m) and weight is 184 lb  2 oz (83.5 kg). Her oral temperature is 98.1 F (36.7 C). Her blood pressure is 146/90 (abnormal) and her pulse is 67. Her respiration is 18 and oxygen saturation is 99%.   General: Alert and oriented, in no acute distress HEENT: Head  is normocephalic. Extraocular movements are intact. Oropharynx is clear. Neck: Neck is supple, no palpable cervical or supraclavicular lymphadenopathy. Heart: Regular in rate and rhythm with no murmurs, rubs, or gallops. Chest: Clear to auscultation bilaterally, with no rhonchi, wheezes, or rales. Abdomen: Soft, nontender, nondistended, with no rigidity or guarding. Extremities: No cyanosis or edema. Lymphatics: see Neck Exam Skin: No concerning lesions. Musculoskeletal: symmetric strength and muscle tone throughout. Neurologic: Cranial nerves II through XII are grossly intact. No obvious focalities. Speech is fluent. Coordination is intact. Psychiatric: Judgment and insight are intact. Affect is appropriate. Breasts: right breast at the 12-1 o'clock position, she has two areas of thickening that each measure about 1.5 cm. No other palpable masses appreciated in the breasts or axillae.    ECOG = 0  0 - Asymptomatic (Fully active, able to carry on all predisease activities without restriction)  1 - Symptomatic but completely ambulatory (Restricted in physically strenuous activity but ambulatory and able to carry out work of a light or sedentary nature. For example, light housework, office work)  2 - Symptomatic, <50% in bed during the day (Ambulatory and capable of all self care but unable to carry out any work activities. Up and about more than 50% of waking hours)  3 - Symptomatic, >50% in bed, but not bedbound (Capable of only limited self-care, confined to bed or chair 50% or more of waking hours)  4 - Bedbound (Completely disabled. Cannot carry on any self-care. Totally confined to bed or chair)  5 - Death   Eustace Pen MM, Creech RH, Tormey DC, et al. [phone]). "Toxicity and response criteria of the [facility]". [place] Oncol. 5 (6): 649-55   LABORATORY DATA:  Lab Results  Component Value Date   WBC 5.0 08/15/2018   HGB 12.9 08/15/2018   HCT 38.0  08/15/2018   MCV 90 08/15/2018   PLT 219 08/15/2018   CMP     Component Value Date/Time   NA 142 08/15/2018 1135   K 4.8 08/15/2018 1135   CL 103 08/15/2018 1135   CO2 19 (L) 08/15/2018 1135   GLUCOSE 93 08/15/2018 1135   BUN 14 08/15/2018 1135   CREATININE 0.80 08/15/2018 1135   CALCIUM 9.5 08/15/2018 1135   PROT 7.2 08/15/2018 1135   ALBUMIN 4.5 08/15/2018 1135   AST 18 08/15/2018 1135   ALT 12 08/15/2018 1135   ALKPHOS 105 08/15/2018 1135   BILITOT 0.6 08/15/2018 1135   GFRNONAA 80 08/15/2018 1135   GFRAA 92 08/15/2018 1135        RADIOGRAPHY: as above  IMPRESSION/PLAN: Clinical stage from 10/24/2018: Stage IA (cT1c(2), cN0, cM0, G2, ER+, PR+, HER2+) right breast cancer  It was a pleasure meeting the patient today. We discussed the risks, benefits, and side effects of radiotherapy. I recommend radiotherapy to the right breast to reduce her risk of locoregional recurrence by 2/3.  We discussed that radiation would take approximately 4 weeks to complete and that I would give the patient a few weeks to heal following surgery before starting treatment planning.  We spoke about acute effects including skin irritation and fatigue as well as much less common late effects including internal organ injury or irritation. We spoke about  the latest technology that is used to minimize the risk of late effects for patients undergoing radiotherapy to the breast or chest wall. No guarantees of treatment were given.  A consent was signed and placed in her chart today. The patient is enthusiastic about proceeding with treatment. I look forward to participating in the patient's care.  I will await her referral back to me for postoperative follow-up and eventual CT simulation/treatment planning.   __________________________________________   Eppie Gibson, MD   This document serves as a record of services personally performed by Eppie Gibson, MD. It was created on her behalf by [facility], a  trained medical scribe. The creation of this record is based on the scribe's personal observations and the provider's statements to them. This document has been checked and approved by the attending provider.

## 2018-10-26 NOTE — Research (Signed)
[id number] UPBEAT Consent-  This research RN, along with research RN Otilio Miu, met with patient who was accompanied by her husband and daughter for approximately 45 minutes. This RN had previously emailed patient informed consent and HIPAA form, per the pt's request, for her to review.The patient stated that she had printed out and read the informed consent and HIPAA form at home. This RN reviewed informed consent and HIPAA form page by page with patient. All of the patient's questions were answered to her satisfaction. Pt was told that participation was voluntary and that she could stop taking part in the study at any time. This RN explained the purpose of the study, benefits and risks, what procedures she would have as part of the study, the cost of the study to her, her rights in the study, and how her personal information would be kept private.  Informed pt that she will receive $25 [employer] gift card at the completion of study activities at 4 time points. Once all of patient's questions were answered, pt signed informed consent at 9:03 am. Pt declined to participate in the optional samples for the [facility] and also declined to receive text messages from the study. Pt has not had her breast MRI yet and states that she doesn't think she's claustrophobic.Pt received copy of signed informed consent and HIPAA form for her records. Pt has agreed to come in following her chemo education class on Jan. 24th to complete her baseline assessments. Pt denied further questions at this time and was thanked for her time and interest in the study. Pt was encouraged to call Dr. Lindi Adie or myself with any questions.  Johney Maine RN, BSN Clinical Research  10/26/2018 [phone]

## 2018-10-26 NOTE — Progress Notes (Signed)
UPBEAT  This RN received a referral from Dr. Lindi Adie, who thought patient would be a good candidate for UPBEAT. This RN was unable to meet with patient at time of her appointment with Dr. Lindi Adie. Patient requested that the information about the study be emailed to her. This RN emailed the informed consent and HIPAA form to the email provided by the patient. This RN provided patient with this RN's contact information and told her to contact me if she had any questions. This RN asked the patient if she would be available to meet with me after her radonc appointment on 10/26/2018. Thanked patient for her time and interest. Johney Maine RN, BSN Clinical Research  10/25/2018 1145    Patient emailed back that she would be able to meet with me after her appointment with Dr. Isidore Moos. This RN told patient that she would ask Dr. Pearlie Oyster nurse to page her when the patient was done with her appointment. Also, gave patient phone number in case she needed it. Johney Maine RN, BSN Clinical Research  10/25/2018 1341  Patient previously requested that this RN email her with her appointment information for her cardiac MRI. This RN emailed that her MRI had been schedule for 1100am 11/02/2018. Also, asked patient to confirm that she could hold her breath for 10 seconds and walk two blocks without difficulty. Johney Maine RN, BSN Clinical Research  10/26/2018 1358  Patient responded to email and confirmed that she is able to MRI on 1/24 and that she can hold her breath for at least ten seconds and walk two blocks without difficulty.  Johney Maine RN, BSN Clinical Research  10/26/2018 [phone]

## 2018-10-28 ENCOUNTER — Ambulatory Visit
Admission: RE | Admit: 2018-10-28 | Discharge: 2018-10-28 | Disposition: A | Discharge status: Home / Self Care | Payer: BLUE CROSS/BLUE SHIELD | Source: Ambulatory Visit | Attending: General Surgery | Admitting: General Surgery

## 2018-10-28 DIAGNOSIS — C50211 Malignant neoplasm of upper-inner quadrant of right female breast: Secondary | ICD-10-CM

## 2018-10-28 DIAGNOSIS — Z803 Family history of malignant neoplasm of breast: Secondary | ICD-10-CM | POA: Diagnosis not present

## 2018-10-28 DIAGNOSIS — Z171 Estrogen receptor negative status [ER-]: Principal | ICD-10-CM

## 2018-10-28 MED ORDER — GADOBUTROL 1 MMOL/ML IV SOLN
7.0000 mL | Freq: Once | INTRAVENOUS | Status: AC | PRN
  Administered 2018-10-28: 7 mL via INTRAVENOUS

## 2018-10-29 ENCOUNTER — Encounter (HOSPITAL_COMMUNITY): Payer: Self-pay | Admitting: Internal Medicine

## 2018-10-29 ENCOUNTER — Ambulatory Visit (HOSPITAL_COMMUNITY)
Admission: RE | Admit: 2018-10-29 | Discharge: 2018-10-29 | Disposition: A | Discharge status: Home / Self Care | Payer: BLUE CROSS/BLUE SHIELD | Source: Ambulatory Visit | Attending: Internal Medicine | Admitting: Internal Medicine

## 2018-10-29 ENCOUNTER — Ambulatory Visit (HOSPITAL_BASED_OUTPATIENT_CLINIC_OR_DEPARTMENT_OTHER)
Admission: RE | Admit: 2018-10-29 | Discharge: 2018-10-29 | Disposition: A | Discharge status: Home / Self Care | Payer: BLUE CROSS/BLUE SHIELD | Source: Ambulatory Visit | Attending: Internal Medicine | Admitting: Internal Medicine

## 2018-10-29 VITALS — BP 126/74 | HR 47 | Wt 184.6 lb

## 2018-10-29 DIAGNOSIS — I519 Heart disease, unspecified: Secondary | ICD-10-CM | POA: Insufficient documentation

## 2018-10-29 DIAGNOSIS — Z170 Estrogen receptor positive status [ER+]: Secondary | ICD-10-CM

## 2018-10-29 DIAGNOSIS — C50211 Malignant neoplasm of upper-inner quadrant of right female breast: Secondary | ICD-10-CM | POA: Diagnosis not present

## 2018-10-29 NOTE — Progress Notes (Signed)
Cardio-Oncology Clinic Consult Note   Referring Physician: Dr Lindi Adie Primary Care: Chevis Pretty, FNP Primary Cardiologist: Dr Haroldine Laws  HPI:  Judith Thomas is a 63 y.o. female with no past medical history who has been referred by Dr. Lindi Adie to establish in the cardio-oncology clinic for monitoring of cardio-toxicity while undergoing chemotherapy.  Malignant neoplasm of upper-inner quadrant of right breast in female, estrogen receptor positive ([place])   09/26/2018 Initial Diagnosis    Two right breast masses 12:00 to 1 o'clock position 1.3 cm and 1.2 cm, they are 1.5 cm apart.  Biopsy revealed grade 2 invasive ductal carcinoma ER 70%- 100%, PR 0% -20%, Ki-67 20%, HER-2 3+ by IHC and FISH ratio 2.15 with a gene copy number of 4.2 for the tumor that was 2+ by IHC, T1c N0 stage Ia    10/24/2018 Cancer Staging    Staging form: Breast, AJCC 8th Edition - Clinical stage from 10/24/2018: Stage IA (cT1c(2), cN0, cM0, G2, ER+, PR+, HER2+) - Signed by Nicholas Lose, MD on 10/24/2018   Treatment Plan  1. Neoadjuvant chemotherapy with Taxol Herceptin weekly x12 followed by Herceptin maintenance vs Kadcyla for 1 year 2. Followed by breast conserving surgery if possible with sentinel lymph node study 3. Followed by adjuvant radiation therapy if patient had lumpectomy 4.  Followed by antiestrogen therapy  Echo today shows EF 60-65% GLS -19.9% Personally reviewed   Denies any known medical problems. No h/o heart disease. Active without any CP or SOB.   Review of Systems: [y] = yes, _0  = no   . General: Weight gain _1 ; Weight loss _2 ; Anorexia _3 ; Fatigue _4 ; Fever _5 ; Chills _6 ; Weakness _7   . Cardiac: Chest pain/pressure _8 ; Resting SOB _9 ; Exertional SOB _10 ; Orthopnea _11 ; Pedal Edema _12 ; Palpitations _13 ; Syncope _14 ; Presyncope _15 ; Paroxysmal nocturnal dyspnea_16   . Pulmonary: Cough _17 ; Wheezing_18 ; Hemoptysis_19 ; Sputum _20 ; Snoring _21   . GI: Vomiting_22 ;  Dysphagia_23 ; Melena_24 ; Hematochezia _25 ; Heartburn_26 ; Abdominal pain _27 ; Constipation _28 ; Diarrhea _29 ; BRBPR _30   . GU: Hematuria_31 ; Dysuria _32 ; Nocturia_33   . Vascular: Pain in legs with walking _34 ; Pain in feet with lying flat _35 ; Non-healing sores _36 ; Stroke _37 ; TIA _38 ; Slurred speech _39 ;  . Neuro: Headaches_40 ; Vertigo_41 ; Seizures_42 ; Paresthesias_43 ;Blurred vision _44 ; Diplopia _45 ; Vision changes _46   . Ortho/Skin: Arthritis Blue.Reese ]; Joint pain _47 ; Muscle pain _48 ; Joint swelling _49 ; Back Pain _50 ; Rash _51   . Psych: Depression_52 ; Anxiety_53   . Heme: Bleeding problems _54 ; Clotting disorders _55 ; Anemia _56   . Endocrine: Diabetes _57 ; Thyroid dysfunction_58    PMHx - no major PHMHx  Current Outpatient Medications  Medication Sig Dispense Refill  . Multiple Vitamin (MULTIVITAMIN) tablet Take 1 tablet by mouth daily.     No current facility-administered medications for this encounter.     No Known Allergies    Social History   Socioeconomic History  . Marital status: Married    Spouse name: Not on file  . Number of children: Not on file  . Years of education: Not on file  . Highest education level: Not on file  Occupational History  . Not on file  Social Needs  . Financial resource strain: Not  on file  . Food insecurity:    Worry: Not on file    Inability: Not on file  . Transportation needs:    Medical: No    Non-medical: No  Tobacco Use  . Smoking status: Never Smoker  . Smokeless tobacco: Never Used  Substance and Sexual Activity  . Alcohol use: Yes    Alcohol/week: 1.0 standard drinks    Types: 1 Standard drinks or equivalent per week  . Drug use: No  . Sexual activity: Not on file  Lifestyle  . Physical activity:    Days per week: Not on file    Minutes per session: Not on file  . Stress: Not on file  Relationships  . Social connections:    Talks on phone: Not on file    Gets together: Not on file    Attends religious service: Not on  file    Active member of club or organization: Not on file    Attends meetings of clubs or organizations: Not on file    Relationship status: Not on file  . Intimate partner violence:    Fear of current or ex partner: No    Emotionally abused: No    Physically abused: No    Forced sexual activity: No  Other Topics Concern  . Not on file  Social History Narrative  . Not on file      Family History  Problem Relation Age of Onset  . Liver disease Mother   . Heart disease Mother        afib  . Heart disease Father   . Asthma Father   . Diabetes Father     Vitals:   10/29/18 1403  BP: 126/74  Pulse: (!) 47  SpO2: 98%  Weight: 83.7 kg (184 lb 9.6 oz)     PHYSICAL EXAM: General:  Well appearing. No respiratory difficulty HEENT: normal Neck: supple. no JVD. Carotids 2+ bilat; no bruits. No lymphadenopathy or thyromegaly appreciated. Cor: PMI nondisplaced. Regular rate & rhythm. No rubs, gallops or murmurs. Lungs: clear Abdomen: soft, nontender, nondistended. No hepatosplenomegaly. No bruits or masses. Good bowel sounds. Extremities: no cyanosis, clubbing, rash, edema Neuro: alert & oriented x 3, cranial nerves grossly intact. moves all 4 extremities w/o difficulty. Affect pleasant.   ASSESSMENT & PLAN:  1. Malignant neoplasm of upper-inner quadrant of right breast in female, estrogen receptor positive ([place]) - Echo today shows EF 60-65% GLS -19.9% - She starts chemo + Hercetpin on 11/08/2018 - Explained incidence of Herceptin cardiotoxicity and role of Cardio-oncology clinic at length. Echo images reviewed personally. All parameters stable. Reviewed signs and symptoms of HF to look for. Continue Herceptin. Follow-up with echo in 3 months.  Glori Bickers, MD  3:30 PM

## 2018-10-29 NOTE — Patient Instructions (Signed)
Your physician has requested that you have an echocardiogram. Echocardiography is a painless test that uses sound waves to create images of your heart. It provides your doctor with information about the size and shape of your heart and how well your heart's chambers and valves are working. This procedure takes approximately one hour. There are no restrictions for this procedure.  Your physician recommends that you schedule a follow-up appointment in: 3 months with an ECHO.

## 2018-10-29 NOTE — Progress Notes (Signed)
  Echocardiogram 2D Echocardiogram has been performed.  Judith Thomas 10/29/2018, 2:02 PM

## 2018-10-30 ENCOUNTER — Encounter (HOSPITAL_BASED_OUTPATIENT_CLINIC_OR_DEPARTMENT_OTHER): Payer: Self-pay | Admitting: *Deleted

## 2018-10-30 ENCOUNTER — Other Ambulatory Visit: Payer: Self-pay

## 2018-10-30 NOTE — Progress Notes (Signed)
Ensure pre surgery drink given with instructions to complete by 0800 dos, pt verbalized understanding. 

## 2018-10-30 NOTE — Progress Notes (Signed)
Instructed to pick up pre surgery ensure and consume by 8 am DOS. Patient voiced understanding

## 2018-10-31 ENCOUNTER — Telehealth: Payer: Self-pay

## 2018-10-31 NOTE — Progress Notes (Signed)
WF [id number] UPBEAT  Patient eligibility was reviewed by second nurse Doreatha Martin, system-wide research manager on 10/30/2018 and she was deemed eligible. Patient was registered on 10/31/2018 and given PID [phone].  Johney Maine RN, BSN Clinical Research  11/02/2018

## 2018-10-31 NOTE — Telephone Encounter (Signed)
Spoke with patient regarding MRI for UPBEAT study. Asked patient if she would be willing to come in Monday 1/27 @ noon for MRI instead of Friday. Patient agreed to come in on Monday. Thanked patient for her time and flexibility.  Johney Maine RN, BSN Clinical Research  10/31/2018 1300

## 2018-11-01 ENCOUNTER — Other Ambulatory Visit: Payer: Self-pay | Admitting: Hematology and Oncology

## 2018-11-01 ENCOUNTER — Encounter: Payer: BLUE CROSS/BLUE SHIELD | Admitting: Medical Oncology

## 2018-11-01 DIAGNOSIS — C50211 Malignant neoplasm of upper-inner quadrant of right female breast: Secondary | ICD-10-CM

## 2018-11-01 DIAGNOSIS — Z170 Estrogen receptor positive status [ER+]: Principal | ICD-10-CM

## 2018-11-01 MED ORDER — LIDOCAINE-PRILOCAINE 2.5-2.5 % EX CREA: TOPICAL_CREAM | CUTANEOUS | 3 refills | Status: DC

## 2018-11-01 MED ORDER — PROCHLORPERAZINE MALEATE 10 MG PO TABS: 10.0000 mg | ORAL_TABLET | Freq: Four times a day (QID) | ORAL | 1 refills | Status: DC | PRN

## 2018-11-01 MED ORDER — ONDANSETRON HCL 8 MG PO TABS: 8.0000 mg | ORAL_TABLET | Freq: Two times a day (BID) | ORAL | 1 refills | Status: DC | PRN

## 2018-11-01 NOTE — Progress Notes (Signed)
START OFF PATHWAY REGIMEN - Breast   [id number]:Paclitaxel + Trastuzumab:   A cycle is every 28 days:     Paclitaxel      Trastuzumab-xxxx      Trastuzumab-xxxx   **Always confirm dose/schedule in your pharmacy ordering system**  Patient Characteristics: Preoperative or Nonsurgical Candidate (Clinical Staging), Neoadjuvant Therapy followed by Surgery, Invasive Disease, Chemotherapy, HER2 Positive, ER Positive Therapeutic Status: Preoperative or Nonsurgical Candidate (Clinical Staging) AJCC M Category: cM0 AJCC Grade: G3 Breast Surgical Plan: Neoadjuvant Therapy followed by Surgery ER Status: Positive (+) AJCC 8 Stage Grouping: IA HER2 Status: Positive (+) AJCC T Category: cT1c AJCC N Category: cN0 PR Status: Negative (-) Intent of Therapy: Curative Intent, Discussed with Patient

## 2018-11-02 ENCOUNTER — Inpatient Hospital Stay: Payer: BLUE CROSS/BLUE SHIELD

## 2018-11-02 ENCOUNTER — Encounter: Payer: Self-pay | Admitting: Medical Oncology

## 2018-11-02 ENCOUNTER — Other Ambulatory Visit: Payer: Self-pay | Admitting: Medical Oncology

## 2018-11-02 ENCOUNTER — Ambulatory Visit (HOSPITAL_COMMUNITY): Admission: RE | Admit: 2018-11-02 | Payer: BLUE CROSS/BLUE SHIELD | Source: Ambulatory Visit

## 2018-11-02 ENCOUNTER — Inpatient Hospital Stay: Payer: BLUE CROSS/BLUE SHIELD | Admitting: Medical Oncology

## 2018-11-02 DIAGNOSIS — C50211 Malignant neoplasm of upper-inner quadrant of right female breast: Secondary | ICD-10-CM

## 2018-11-02 DIAGNOSIS — Z170 Estrogen receptor positive status [ER+]: Principal | ICD-10-CM

## 2018-11-02 NOTE — Research (Addendum)
WF [id number] QKMMNO- Baseline Visit 0910am  The patient met with this research nurse for her baseline assessments. Farris Has, research assistant, administered the neurocognitive testing. Then the pt completed her self-administered questionnaires. This nurse reviewed the pt's self administered questionnaires for completeness and accuracy. Pt also signed release of information form. This RN reviewed with patient her medication list, past medical history, and AEs. This RN, with research nurse Otilio Miu, administered physical function tests to pt. Patient's waist line was also measured at 42 inches. Pt was reminded to fast for 3 hours prior to her labs on 11/08/2018. Also, confirmed with patient that her MRI was scheduled for 12pm on 11/05/2018. Thanked patient for participation in study. Johney Maine RN, BSN Clinical Research  11/02/2018 [phone]

## 2018-11-05 ENCOUNTER — Ambulatory Visit (HOSPITAL_COMMUNITY)
Admission: RE | Admit: 2018-11-05 | Discharge: 2018-11-05 | Disposition: A | Discharge status: Home / Self Care | Payer: BLUE CROSS/BLUE SHIELD | Source: Ambulatory Visit | Attending: Hematology and Oncology | Admitting: Hematology and Oncology

## 2018-11-05 DIAGNOSIS — C50919 Malignant neoplasm of unspecified site of unspecified female breast: Secondary | ICD-10-CM | POA: Insufficient documentation

## 2018-11-06 ENCOUNTER — Encounter: Payer: Self-pay | Admitting: Hematology and Oncology

## 2018-11-06 NOTE — Progress Notes (Signed)
Called pt to introduce myself as her Arboriculturist and to discuss copay assistance.  Pt gave me consent to apply in her behalf so I applied to the [employer] [place] but she was denied because her household income exceeded the guidelines of their program so I enrolled her in the[employer] program. She wasapproved for $25,000 for Herceptin for a year from1/28/20.Pt's responsibility will be as little as $5 per infusion.  Pt is also overqualified for the [employer].  Iwill giveher my card on 11/08/18 for any questions or concerns she may have in the future.

## 2018-11-07 ENCOUNTER — Encounter (HOSPITAL_BASED_OUTPATIENT_CLINIC_OR_DEPARTMENT_OTHER)
Admission: RE | Disposition: A | Discharge status: Home / Self Care | Payer: Self-pay | Source: Home / Self Care | Attending: General Surgery

## 2018-11-07 ENCOUNTER — Ambulatory Visit (HOSPITAL_COMMUNITY): Payer: BLUE CROSS/BLUE SHIELD

## 2018-11-07 ENCOUNTER — Ambulatory Visit (HOSPITAL_BASED_OUTPATIENT_CLINIC_OR_DEPARTMENT_OTHER): Payer: BLUE CROSS/BLUE SHIELD | Admitting: Certified Registered"

## 2018-11-07 ENCOUNTER — Encounter (HOSPITAL_BASED_OUTPATIENT_CLINIC_OR_DEPARTMENT_OTHER): Payer: Self-pay

## 2018-11-07 ENCOUNTER — Other Ambulatory Visit: Payer: Self-pay

## 2018-11-07 ENCOUNTER — Ambulatory Visit (HOSPITAL_BASED_OUTPATIENT_CLINIC_OR_DEPARTMENT_OTHER)
Admission: RE | Admit: 2018-11-07 | Discharge: 2018-11-07 | Disposition: A | Discharge status: Home / Self Care | Payer: BLUE CROSS/BLUE SHIELD | Attending: General Surgery | Admitting: General Surgery

## 2018-11-07 DIAGNOSIS — Z170 Estrogen receptor positive status [ER+]: Secondary | ICD-10-CM | POA: Insufficient documentation

## 2018-11-07 DIAGNOSIS — Z803 Family history of malignant neoplasm of breast: Secondary | ICD-10-CM | POA: Insufficient documentation

## 2018-11-07 DIAGNOSIS — Z95828 Presence of other vascular implants and grafts: Secondary | ICD-10-CM

## 2018-11-07 DIAGNOSIS — J9811 Atelectasis: Secondary | ICD-10-CM | POA: Diagnosis not present

## 2018-11-07 DIAGNOSIS — Z419 Encounter for procedure for purposes other than remedying health state, unspecified: Secondary | ICD-10-CM

## 2018-11-07 DIAGNOSIS — Z4682 Encounter for fitting and adjustment of non-vascular catheter: Secondary | ICD-10-CM | POA: Diagnosis not present

## 2018-11-07 DIAGNOSIS — C50211 Malignant neoplasm of upper-inner quadrant of right female breast: Secondary | ICD-10-CM | POA: Diagnosis not present

## 2018-11-07 DIAGNOSIS — Z452 Encounter for adjustment and management of vascular access device: Secondary | ICD-10-CM | POA: Diagnosis not present

## 2018-11-07 HISTORY — PX: PORTACATH PLACEMENT: SHX2246

## 2018-11-07 HISTORY — DX: Malignant neoplasm of unspecified site of unspecified female breast: C50.919

## 2018-11-07 SURGERY — INSERTION, TUNNELED CENTRAL VENOUS DEVICE, WITH PORT
Anesthesia: General | Site: Chest | Laterality: Left

## 2018-11-07 MED ORDER — CEFAZOLIN SODIUM-DEXTROSE 2-4 GM/100ML-% IV SOLN
INTRAVENOUS | Status: AC
  Filled 2018-11-07: qty 100

## 2018-11-07 MED ORDER — GABAPENTIN 300 MG PO CAPS
ORAL_CAPSULE | ORAL | Status: AC
  Filled 2018-11-07: qty 1

## 2018-11-07 MED ORDER — FENTANYL CITRATE (PF) 100 MCG/2ML IJ SOLN
INTRAMUSCULAR | Status: AC
  Filled 2018-11-07: qty 2

## 2018-11-07 MED ORDER — HEPARIN SOD (PORK) LOCK FLUSH 100 UNIT/ML IV SOLN
INTRAVENOUS | Status: DC | PRN
  Administered 2018-11-07: 500 [IU] via INTRAVENOUS

## 2018-11-07 MED ORDER — DEXAMETHASONE SODIUM PHOSPHATE 4 MG/ML IJ SOLN
INTRAMUSCULAR | Status: DC | PRN
  Administered 2018-11-07: 10 mg via INTRAVENOUS

## 2018-11-07 MED ORDER — CELECOXIB 200 MG PO CAPS
200.0000 mg | ORAL_CAPSULE | ORAL | Status: AC
  Administered 2018-11-07: 200 mg via ORAL

## 2018-11-07 MED ORDER — MIDAZOLAM HCL 2 MG/2ML IJ SOLN
INTRAMUSCULAR | Status: AC
  Filled 2018-11-07: qty 2

## 2018-11-07 MED ORDER — SCOPOLAMINE 1 MG/3DAYS TD PT72: 1.0000 | MEDICATED_PATCH | Freq: Once | TRANSDERMAL | Status: DC | PRN

## 2018-11-07 MED ORDER — ONDANSETRON HCL 4 MG/2ML IJ SOLN
INTRAMUSCULAR | Status: AC
  Filled 2018-11-07: qty 16

## 2018-11-07 MED ORDER — PROPOFOL 10 MG/ML IV BOLUS
INTRAVENOUS | Status: DC | PRN
  Administered 2018-11-07: 100 mg via INTRAVENOUS

## 2018-11-07 MED ORDER — LIDOCAINE 2% (20 MG/ML) 5 ML SYRINGE
INTRAMUSCULAR | Status: AC
  Filled 2018-11-07: qty 20

## 2018-11-07 MED ORDER — HEPARIN (PORCINE) IN NACL 2-0.9 UNITS/ML
INTRAMUSCULAR | Status: AC | PRN
  Administered 2018-11-07: 1 via INTRAVENOUS

## 2018-11-07 MED ORDER — EPHEDRINE 5 MG/ML INJ
INTRAVENOUS | Status: AC
  Filled 2018-11-07: qty 10

## 2018-11-07 MED ORDER — CHLORHEXIDINE GLUCONATE CLOTH 2 % EX PADS: 6.0000 | MEDICATED_PAD | Freq: Once | CUTANEOUS | Status: DC

## 2018-11-07 MED ORDER — HYDROCODONE-ACETAMINOPHEN 5-325 MG PO TABS: 1.0000 | ORAL_TABLET | Freq: Four times a day (QID) | ORAL | 0 refills | Status: DC | PRN

## 2018-11-07 MED ORDER — ACETAMINOPHEN 500 MG PO TABS
ORAL_TABLET | ORAL | Status: AC
  Filled 2018-11-07: qty 2

## 2018-11-07 MED ORDER — MIDAZOLAM HCL 5 MG/5ML IJ SOLN
INTRAMUSCULAR | Status: DC | PRN
  Administered 2018-11-07: 2 mg via INTRAVENOUS

## 2018-11-07 MED ORDER — HEPARIN (PORCINE) IN NACL 1000-0.9 UT/500ML-% IV SOLN
INTRAVENOUS | Status: AC
  Filled 2018-11-07: qty 500

## 2018-11-07 MED ORDER — LIDOCAINE HCL (CARDIAC) PF 100 MG/5ML IV SOSY
PREFILLED_SYRINGE | INTRAVENOUS | Status: DC | PRN
  Administered 2018-11-07: 60 mg via INTRAVENOUS

## 2018-11-07 MED ORDER — ACETAMINOPHEN 500 MG PO TABS
1000.0000 mg | ORAL_TABLET | ORAL | Status: AC
  Administered 2018-11-07: 1000 mg via ORAL

## 2018-11-07 MED ORDER — CELECOXIB 200 MG PO CAPS
ORAL_CAPSULE | ORAL | Status: AC
  Filled 2018-11-07: qty 1

## 2018-11-07 MED ORDER — DEXAMETHASONE SODIUM PHOSPHATE 10 MG/ML IJ SOLN
INTRAMUSCULAR | Status: AC
  Filled 2018-11-07: qty 5

## 2018-11-07 MED ORDER — GABAPENTIN 300 MG PO CAPS
300.0000 mg | ORAL_CAPSULE | ORAL | Status: AC
  Administered 2018-11-07: 300 mg via ORAL

## 2018-11-07 MED ORDER — LACTATED RINGERS IV SOLN
INTRAVENOUS | Status: DC
  Administered 2018-11-07: 09:00:00 via INTRAVENOUS

## 2018-11-07 MED ORDER — FENTANYL CITRATE (PF) 100 MCG/2ML IJ SOLN
INTRAMUSCULAR | Status: DC | PRN
  Administered 2018-11-07 (×2): 50 ug via INTRAVENOUS

## 2018-11-07 MED ORDER — BUPIVACAINE-EPINEPHRINE 0.25% -1:200000 IJ SOLN
INTRAMUSCULAR | Status: DC | PRN
  Administered 2018-11-07: 5 mL

## 2018-11-07 MED ORDER — ONDANSETRON HCL 4 MG/2ML IJ SOLN
INTRAMUSCULAR | Status: DC | PRN
  Administered 2018-11-07: 4 mg via INTRAVENOUS

## 2018-11-07 MED ORDER — FENTANYL CITRATE (PF) 100 MCG/2ML IJ SOLN: 25.0000 ug | INTRAMUSCULAR | Status: DC | PRN

## 2018-11-07 MED ORDER — CEFAZOLIN SODIUM-DEXTROSE 2-4 GM/100ML-% IV SOLN
2.0000 g | INTRAVENOUS | Status: AC
  Administered 2018-11-07: 2 g via INTRAVENOUS

## 2018-11-07 MED ORDER — HEPARIN SOD (PORK) LOCK FLUSH 100 UNIT/ML IV SOLN
INTRAVENOUS | Status: AC
  Filled 2018-11-07: qty 5

## 2018-11-07 SURGICAL SUPPLY — 49 items
ADH SKN CLS APL DERMABOND .7 (GAUZE/BANDAGES/DRESSINGS) ×1
BAG DECANTER FOR FLEXI CONT (MISCELLANEOUS) ×3 IMPLANT
BLADE SURG 15 STRL LF DISP TIS (BLADE) ×1 IMPLANT
BLADE SURG 15 STRL SS (BLADE) ×3
CANISTER SUCT 1200ML W/VALVE (MISCELLANEOUS) IMPLANT
CHLORAPREP W/TINT 26ML (MISCELLANEOUS) ×3 IMPLANT
CLEANER CAUTERY TIP 5X5 PAD (MISCELLANEOUS) ×1 IMPLANT
COVER BACK TABLE 60X90IN (DRAPES) ×3 IMPLANT
COVER MAYO STAND STRL (DRAPES) ×3 IMPLANT
COVER WAND RF STERILE (DRAPES) IMPLANT
DECANTER SPIKE VIAL GLASS SM (MISCELLANEOUS) IMPLANT
DERMABOND ADVANCED (GAUZE/BANDAGES/DRESSINGS) ×2
DERMABOND ADVANCED .7 DNX12 (GAUZE/BANDAGES/DRESSINGS) ×1 IMPLANT
DRAPE C-ARM 42X72 X-RAY (DRAPES) ×3 IMPLANT
DRAPE LAPAROSCOPIC ABDOMINAL (DRAPES) ×3 IMPLANT
DRAPE UTILITY XL STRL (DRAPES) ×3 IMPLANT
ELECT REM PT RETURN 9FT ADLT (ELECTROSURGICAL) ×3
ELECTRODE REM PT RTRN 9FT ADLT (ELECTROSURGICAL) ×1 IMPLANT
GLOVE BIO SURGEON STRL SZ 6.5 (GLOVE) ×1 IMPLANT
GLOVE BIO SURGEON STRL SZ7.5 (GLOVE) ×3 IMPLANT
GLOVE BIO SURGEONS STRL SZ 6.5 (GLOVE) ×1
GOWN STRL REUS W/ TWL LRG LVL3 (GOWN DISPOSABLE) ×2 IMPLANT
GOWN STRL REUS W/TWL LRG LVL3 (GOWN DISPOSABLE) ×6
IV KIT MINILOC 20X1 SAFETY (NEEDLE) IMPLANT
KIT PORT POWER 8FR ISP CVUE (Port) ×2 IMPLANT
NDL HYPO 25X1 1.5 SAFETY (NEEDLE) ×1 IMPLANT
NDL SAFETY ECLIPSE 18X1.5 (NEEDLE) IMPLANT
NDL SPNL 22GX3.5 QUINCKE BK (NEEDLE) IMPLANT
NEEDLE HYPO 18GX1.5 SHARP (NEEDLE) ×3
NEEDLE HYPO 22GX1.5 SAFETY (NEEDLE) ×2 IMPLANT
NEEDLE HYPO 25X1 1.5 SAFETY (NEEDLE) ×3 IMPLANT
NEEDLE SPNL 22GX3.5 QUINCKE BK (NEEDLE) IMPLANT
PACK BASIN DAY SURGERY FS (CUSTOM PROCEDURE TRAY) ×3 IMPLANT
PAD CLEANER CAUTERY TIP 5X5 (MISCELLANEOUS) ×2
PENCIL BUTTON HOLSTER BLD 10FT (ELECTRODE) ×3 IMPLANT
SLEEVE SCD COMPRESS KNEE MED (MISCELLANEOUS) ×2 IMPLANT
SUT MON AB 4-0 PC3 18 (SUTURE) ×3 IMPLANT
SUT PROLENE 2 0 SH DA (SUTURE) ×3 IMPLANT
SUT SILK 2 0 TIES 17X18 (SUTURE)
SUT SILK 2-0 18XBRD TIE BLK (SUTURE) IMPLANT
SUT VIC AB 3-0 SH 27 (SUTURE) ×3
SUT VIC AB 3-0 SH 27X BRD (SUTURE) ×1 IMPLANT
SYR 10ML LL (SYRINGE) ×2 IMPLANT
SYR 5ML LL (SYRINGE) ×3 IMPLANT
SYR CONTROL 10ML LL (SYRINGE) ×4 IMPLANT
TOWEL GREEN STERILE FF (TOWEL DISPOSABLE) ×6 IMPLANT
TUBE CONNECTING 20'X1/4 (TUBING)
TUBE CONNECTING 20X1/4 (TUBING) IMPLANT
YANKAUER SUCT BULB TIP NO VENT (SUCTIONS) IMPLANT

## 2018-11-07 NOTE — Discharge Instructions (Signed)
1,000mg  Tylenol and 200mg  Celebrex taken at 8:25am   Post Anesthesia Home Care Instructions  Activity: Get plenty of rest for the remainder of the day. A responsible individual must stay with you for 24 hours following the procedure.  For the next 24 hours, DO NOT: -Drive a car -Paediatric nurse -Drink alcoholic beverages -Take any medication unless instructed by your physician -Make any legal decisions or sign important papers.  Meals: Start with liquid foods such as gelatin or soup. Progress to regular foods as tolerated. Avoid greasy, spicy, heavy foods. If nausea and/or vomiting occur, drink only clear liquids until the nausea and/or vomiting subsides. Call your physician if vomiting continues.  Special Instructions/Symptoms: Your throat may feel dry or sore from the anesthesia or the breathing tube placed in your throat during surgery. If this causes discomfort, gargle with warm salt water. The discomfort should disappear within 24 hours.  If you had a scopolamine patch placed behind your ear for the management of post- operative nausea and/or vomiting:  1. The medication in the patch is effective for 72 hours, after which it should be removed.  Wrap patch in a tissue and discard in the trash. Wash hands thoroughly with soap and water. 2. You may remove the patch earlier than 72 hours if you experience unpleasant side effects which may include dry mouth, dizziness or visual disturbances. 3. Avoid touching the patch. Wash your hands with soap and water after contact with the patch.

## 2018-11-07 NOTE — Anesthesia Preprocedure Evaluation (Addendum)
Anesthesia Evaluation  Patient identified by MRN, date of birth, ID band Patient awake    Reviewed: Allergy & Precautions, NPO status , Patient's Chart, lab work & pertinent test results  Airway Mallampati: II  TM Distance: >3 FB Neck ROM: Full    Dental no notable dental hx. (+) Teeth Intact, Dental Advisory Given   Pulmonary neg pulmonary ROS,    Pulmonary exam normal breath sounds clear to auscultation       Cardiovascular negative cardio ROS Normal cardiovascular exam Rhythm:Regular Rate:Normal  TTE 2020 Normal EF, mild DD, trivial AI   Neuro/Psych negative neurological ROS  negative psych ROS   GI/Hepatic negative GI ROS, Neg liver ROS,   Endo/Other  negative endocrine ROS  Renal/GU negative Renal ROS  negative genitourinary   Musculoskeletal negative musculoskeletal ROS (+)   Abdominal   Peds  Hematology negative hematology ROS (+)   Anesthesia Other Findings Right breast cancer  Reproductive/Obstetrics                           Anesthesia Physical Anesthesia Plan  ASA: II  Anesthesia Plan: General   Post-op Pain Management:    Induction: Intravenous  PONV Risk Score and Plan: 3 and Midazolam, Dexamethasone and Ondansetron  Airway Management Planned: LMA  Additional Equipment:   Intra-op Plan:   Post-operative Plan: Extubation in OR  Informed Consent: I have reviewed the patients History and Physical, chart, labs and discussed the procedure including the risks, benefits and alternatives for the proposed anesthesia with the patient or authorized representative who has indicated his/her understanding and acceptance.     Dental advisory given  Plan Discussed with: CRNA  Anesthesia Plan Comments:         Anesthesia Quick Evaluation

## 2018-11-07 NOTE — Progress Notes (Signed)
Patient Care Team: Chevis Pretty, FNP as PCP - General (Nurse Practitioner)  DIAGNOSIS:    ICD-10-CM   1. Malignant neoplasm of upper-inner quadrant of right breast in female, estrogen receptor positive ([place]) C50.211 DISCONTINUED: dexamethasone (DECADRON) 10 mg in sodium chloride 0.9 % 50 mL IVPB   Z17.0 DISCONTINUED: diphenhydrAMINE (BENADRYL) injection 25 mg    DISCONTINUED: ondansetron (ZOFRAN) 8 mg in sodium chloride 0.9 % 50 mL IVPB    SUMMARY OF ONCOLOGIC HISTORY:   Malignant neoplasm of upper-inner quadrant of right breast in female, estrogen receptor positive ([place])   09/26/2018 Initial Diagnosis    Two right breast masses 12:00 to 1 o'clock position 1.3 cm and 1.2 cm, they are 1.5 cm apart.  Biopsy revealed grade 2 invasive ductal carcinoma ER 70%- 100%, PR 0% -20%, Ki-67 20%, HER-2 3+ by IHC and FISH ratio 2.15 with a gene copy number of 4.2 for the tumor that was 2+ by IHC, T1c N0 stage Ia    10/24/2018 Cancer Staging    Staging form: Breast, AJCC 8th Edition - Clinical stage from 10/24/2018: Stage IA (cT1c(2), cN0, cM0, G2, ER+, PR+, HER2+) - Signed by Nicholas Lose, MD on 10/24/2018    11/08/2018 -  Neo-Adjuvant Chemotherapy    Neoadjuvant chemotherapy with Taxol Herceptin weekly x12 followed by Herceptin maintenance vs Kadcyla for 1 year     CHIEF COMPLIANT: Cycle 1 Taxol and Herceptin  INTERVAL HISTORY: Judith Thomas is a 63 y.o. with above-mentioned history of right breast cancer who is to begin chemotherapy with weekly Taxol and Herceptin. An ECHO from 10/29/18 showed an ejection fraction in the range of 60-65%. A breast MRI from 10/28/18 showed the two known masses in the right breast, satellite masses surrounding the known masses, and no abnormal lymph nodes or malignancy in the left breast. Her port was inserted yesterday by Dr. [name]. She presents to the clinic today with her husband and is feeling well. Her lab work from today is WNL.   REVIEW OF SYSTEMS:     Constitutional: Denies fevers, chills or abnormal weight loss Eyes: Denies blurriness of vision Ears, nose, mouth, throat, and face: Denies mucositis or sore throat Respiratory: Denies cough, dyspnea or wheezes Cardiovascular: Denies palpitation, chest discomfort Gastrointestinal:  Denies nausea, heartburn or change in bowel habits Skin: Denies abnormal skin rashes Lymphatics: Denies new lymphadenopathy or easy bruising Neurological: Denies numbness, tingling or new weaknesses Behavioral/Psych: Mood is stable, no new changes  Extremities: No lower extremity edema Breast: denies any pain or lumps or nodules in either breasts All other systems were reviewed with the patient and are negative.  I have reviewed the past medical history, past surgical history, social history and family history with the patient and they are unchanged from previous note.  ALLERGIES:  has No Known Allergies.  MEDICATIONS:  Current Outpatient Medications  Medication Sig Dispense Refill  . HYDROcodone-acetaminophen (NORCO/VICODIN) 5-325 MG tablet Take 1-2 tablets by mouth every 6 (six) hours as needed for moderate pain or severe pain. 10 tablet 0  . lidocaine-prilocaine (EMLA) cream Apply to affected area once 30 g 3  . Multiple Vitamin (MULTIVITAMIN) tablet Take 1 tablet by mouth daily.     . ondansetron (ZOFRAN) 8 MG tablet Take 1 tablet (8 mg total) by mouth 2 (two) times daily as needed (Nausea or vomiting). 30 tablet 1  . prochlorperazine (COMPAZINE) 10 MG tablet Take 1 tablet (10 mg total) by mouth every 6 (six) hours as needed (Nausea or vomiting).  30 tablet 1   No current facility-administered medications for this visit.    Facility-Administered Medications Ordered in Other Visits  Medication Dose Route Frequency Provider Last Rate Last Dose  . acetaminophen (TYLENOL) tablet 650 mg  650 mg Oral Once Nicholas Lose, MD      . dexamethasone (DECADRON) injection 10 mg  10 mg Intravenous Once Nicholas Lose,  MD      . diphenhydrAMINE (BENADRYL) injection 25 mg  25 mg Intravenous Once Nicholas Lose, MD      . famotidine (PEPCID) IVPB 20 mg premix  20 mg Intravenous Once Nicholas Lose, MD      . heparin lock flush 100 unit/mL  500 Units Intracatheter Once PRN Nicholas Lose, MD      . ondansetron (ZOFRAN) 8 mg in sodium chloride 0.9 % 50 mL IVPB  8 mg Intravenous Once Nicholas Lose, MD      . PACLitaxel (TAXOL) 156 mg in sodium chloride 0.9 % 250 mL chemo infusion (</= 54m/m2)  80 mg/m2 (Treatment Plan Recorded) Intravenous Once GNicholas Lose MD      . sodium chloride flush (NS) 0.9 % injection 10 mL  10 mL Intracatheter PRN GNicholas Lose MD      . trastuzumab (HERCEPTIN) 336 mg in sodium chloride 0.9 % 250 mL chemo infusion  4 mg/kg (Treatment Plan Recorded) Intravenous Once GNicholas Lose MD        PHYSICAL EXAMINATION: ECOG PERFORMANCE STATUS: 0 - Asymptomatic  Vitals:   11/08/18 0910 11/08/18 0912  BP: (!) 149/85 (!) 146/84  Pulse: 75   Resp: 16   Temp: 98.1 F (36.7 C)   SpO2: 100%    Filed Weights   11/08/18 0910  Weight: 83.7 kg    GENERAL: alert, no distress and comfortable SKIN: skin color, texture, turgor are normal, no rashes or significant lesions EYES: normal, Conjunctiva are pink and non-injected, sclera clear OROPHARYNX: no exudate, no erythema and lips, buccal mucosa, and tongue normal  NECK: supple, thyroid normal size, non-tender, without nodularity LYMPH: no palpable lymphadenopathy in the cervical, axillary or inguinal LUNGS: clear to auscultation and percussion with normal breathing effort HEART: regular rate & rhythm and no murmurs and no lower extremity edema ABDOMEN: abdomen soft, non-tender and normal bowel sounds MUSCULOSKELETAL: no cyanosis of digits and no clubbing  NEURO: alert & oriented x 3 with fluent speech, no focal motor/sensory deficits EXTREMITIES: No lower extremity edema  LABORATORY DATA:  I have reviewed the data as listed CMP Latest Ref  Rng & Units 11/08/2018 08/15/2018 07/21/2017  Glucose 70 - 99 mg/dL 91 93 85  BUN 8 - 23 mg/dL _0 Creatinine 0.44 - 1.00 mg/dL 0.75 0.80 0.73  Sodium 135 - 145 mmol/L 141 142 143  Potassium 3.5 - 5.1 mmol/L 4.2 4.8 4.4  Chloride 98 - 111 mmol/L 106 103 104  CO2 22 - 32 mmol/L 25 19(L) 25  Calcium 8.9 - 10.3 mg/dL 9.4 9.5 9.2  Total Protein 6.5 - 8.1 g/dL 7.2 7.2 7.2  Total Bilirubin 0.3 - 1.2 mg/dL 0.4 0.6 0.7  Alkaline Phos 38 - 126 U/L 102 105 109  AST 15 - 41 U/L 11(L) 18 19  ALT 0 - 44 U/L _1 Lab Results  Component Value Date   WBC 9.1 11/08/2018   HGB 12.1 11/08/2018   HCT 37.2 11/08/2018   MCV 96.4 11/08/2018   PLT 261 11/08/2018   NEUTROABS 6.5 11/08/2018    ASSESSMENT &  PLAN:  Malignant neoplasm of upper-inner quadrant of right breast in female, estrogen receptor positive ([place]) 09/27/19: Two right breast masses 12:00 to 1 o'clock position 1.3 cm and 1.2 cm, they are 1.5 cm apart.  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 Neoadjuvant chemotherapy with Taxol Herceptin weekly x12 followed by Herceptin maintenance vs Kadcyla for 1 year 2. Followed by breast conserving surgery if possible with sentinel lymph node study 3. Followed by adjuvant radiation therapy if patient had lumpectomy 4.  Followed by antiestrogen therapy ------------------------------------------------------------------------------------------------------------------------------------------------------------ Current treatment: Cycle 1 day 1 Taxol Herceptin Echocardiogram 10/29/2018: EF 60 to 65% Labs reviewed Chemo education completed Chemo consent obtained Return to clinic in 1 week for toxicity check and for cycle 2    No orders of the defined types were placed in this encounter.  The patient has a good  understanding of the overall plan. she agrees with it. she will call with any problems that may develop before the next visit here.  Nicholas Lose, MD 11/08/2018  Judith Thomas am acting as scribe for Dr. Nicholas Lose.  I have reviewed the above documentation for accuracy and completeness, and I agree with the above.

## 2018-11-07 NOTE — H&P (Signed)
Judith Thomas  Location: [facility] Patient #: [id number] DOB: Feb 11, 1956 Married / Language: English / Race: White Female   History of Present Illness  The patient is a 63 year old female who presents with breast cancer. We are asked to see the patient in consultation by Dr. Emmit Pomfret to evaluate her for a new right breast cancer. The patient is a 63 year old white female who recently went for a routine screening mammogram. At that time she was found to have 2 small masses in the right breast. The first measured 1.3 cm at 12:00. This was biopsied and came back as invasive ductal type of cancer that was ER positive and PR negative and HER-2 positive with a Ki-67 of 20%. The other mass measured 1.2 cm in the 1:30 o'clock position. This was biopsied and came back as an invasive ductal type of cancer that was ER and PR positive and HER-2 positive with a 67 of 10%. The axilla looked negative. She denies any breast pain or discharge from the nipple. Her only family history is positive for breast cancer in 2 maternal aunts and bladder cancer in her father. She is otherwise in good health.   Past Surgical History  Breast Biopsy  Right.  Diagnostic Studies History  Colonoscopy  5-10 years ago Mammogram  within last year Pap Smear  1-5 years ago  Allergies No Known Drug Allergies   Medication History  No Current Medications Medications Reconciled  Social History  Alcohol use  Occasional alcohol use. Caffeine use  Tea. No drug use  Tobacco use  Never smoker.  Family History  Arthritis  Mother. Breast Cancer  Family Members In General. Cancer  Father. Cerebrovascular Accident  Mother. Diabetes Mellitus  Father. Heart Disease  Mother. Hypertension  Mother. Migraine Headache  Mother. Thyroid problems  Father.  Pregnancy / Birth History Age at menarche  76 years. Age of menopause  52-55 Contraceptive History  Oral  contraceptives. Gravida  2 Irregular periods  Maternal age  23-25 Para  2  Other Problems  Breast Cancer     Review of Systems  General Not Present- Appetite Loss, Chills, Fatigue, Fever, Night Sweats, Weight Gain and Weight Loss. Skin Not Present- Change in Wart/Mole, Dryness, Hives, Jaundice, New Lesions, Non-Healing Wounds, Rash and Ulcer. HEENT Present- Seasonal Allergies and Wears glasses/contact lenses. Not Present- Earache, Hearing Loss, Hoarseness, Nose Bleed, Oral Ulcers, Ringing in the Ears, Sinus Pain, Sore Throat, Visual Disturbances and Yellow Eyes. Respiratory Not Present- Bloody sputum, Chronic Cough, Difficulty Breathing, Snoring and Wheezing. Breast Present- Breast Mass. Not Present- Breast Pain, Nipple Discharge and Skin Changes. Cardiovascular Not Present- Chest Pain, Difficulty Breathing Lying Down, Leg Cramps, Palpitations, Rapid Heart Rate, Shortness of Breath and Swelling of Extremities. Gastrointestinal Not Present- Abdominal Pain, Bloating, Bloody Stool, Change in Bowel Habits, Chronic diarrhea, Constipation, Difficulty Swallowing, Excessive gas, Gets full quickly at meals, Hemorrhoids, Indigestion, Nausea, Rectal Pain and Vomiting. Female Genitourinary Present- Frequency. Not Present- Nocturia, Painful Urination, Pelvic Pain and Urgency. Musculoskeletal Not Present- Back Pain, Joint Pain, Joint Stiffness, Muscle Pain, Muscle Weakness and Swelling of Extremities. Neurological Not Present- Decreased Memory, Fainting, Headaches, Numbness, Seizures, Tingling, Tremor, Trouble walking and Weakness. Psychiatric Not Present- Anxiety, Bipolar, Change in Sleep Pattern, Depression, Fearful and Frequent crying. Endocrine Present- Hot flashes. Not Present- Cold Intolerance, Excessive Hunger, Hair Changes, Heat Intolerance and New Diabetes. Hematology Not Present- Blood Thinners, Easy Bruising, Excessive bleeding, Gland problems, HIV and Persistent Infections.  Vitals   Weight: 186.13  lb Height: 65in Body Surface Area: 1.92 m Body Mass Index: 30.97 kg/m  Pulse: 95 (Regular)  BP: 152/90 (Sitting, Left Arm, Standard)       Physical Exam  General Mental Status-Alert. General Appearance-Consistent with stated age. Hydration-Well hydrated. Voice-Normal.  Head and Neck Head-normocephalic, atraumatic with no lesions or palpable masses. Trachea-midline. Thyroid Gland Characteristics - normal size and consistency.  Eye Eyeball - Bilateral-Extraocular movements intact. Sclera/Conjunctiva - Bilateral-No scleral icterus.  Chest and Lung Exam Chest and lung exam reveals -quiet, even and easy respiratory effort with no use of accessory muscles and on auscultation, normal breath sounds, no adventitious sounds and normal vocal resonance. Inspection Chest Wall - Normal. Back - normal.  Breast Note: There is a small 1 cm palpable mass deep in the upper inner quadrant of the right breast. It is not clear whether this is the cancer or a possible small hematoma. Other than this there is no palpable mass in either breast. There is no palpable axillary, supraclavicular, or cervical lymphadenopathy.   Cardiovascular Cardiovascular examination reveals -normal heart sounds, regular rate and rhythm with no murmurs and normal pedal pulses bilaterally.  Abdomen Inspection Inspection of the abdomen reveals - No Hernias. Skin - Scar - no surgical scars. Palpation/Percussion Palpation and Percussion of the abdomen reveal - Soft, Non Tender, No Rebound tenderness, No Rigidity (guarding) and No hepatosplenomegaly. Auscultation Auscultation of the abdomen reveals - Bowel sounds normal.  Neurologic Neurologic evaluation reveals -alert and oriented x 3 with no impairment of recent or remote memory. Mental Status-Normal.  Musculoskeletal Normal Exam - Left-Upper Extremity Strength Normal and Lower Extremity Strength  Normal. Normal Exam - Right-Upper Extremity Strength Normal and Lower Extremity Strength Normal.  Lymphatic Head & Neck  General Head & Neck Lymphatics: Bilateral - Description - Normal. Axillary  General Axillary Region: Bilateral - Description - Normal. Tenderness - Non Tender. Femoral & Inguinal  Generalized Femoral & Inguinal Lymphatics: Bilateral - Description - Normal. Tenderness - Non Tender.    Assessment & Plan MALIGNANT NEOPLASM OF UPPER-INNER QUADRANT OF RIGHT BREAST IN FEMALE, ESTROGEN RECEPTOR POSITIVE (C50.211) Impression: The patient appears to have 2 small cancers in the upper and upper inner right breast with clinically negative nodes. Because she is HER-2 positive I think she should consider neoadjuvant chemotherapy. I will make her referrals to medical oncology to discuss this. If she is agreeable she will need a Port-A-Cath. I have discussed with her in detail the risks and benefits of the operation and placed port as well as some of the technical aspects and she understands and wishes to proceed. I will also plan for an MRI study given that she has 2 cancers and may need neoadjuvant therapy. Current Plans Referred to Oncology, for evaluation and follow up (Oncology). Routine. MRI BREAST BILATERAL DIAGNOSTIC ([id number])

## 2018-11-07 NOTE — Op Note (Signed)
11/07/2018  10:50 AM  PATIENT:  Judith Thomas  63 y.o. female  PRE-OPERATIVE DIAGNOSIS:  RIGHT BREAST CANCER  POST-OPERATIVE DIAGNOSIS:  RIGHT BREAST CANCER  PROCEDURE:  Procedure(s): INSERTION PORT-A-CATH   SURGEON:  Surgeon(s) and Role:    * Jovita Kussmaul, MD - Primary  PHYSICIAN ASSISTANT:   ASSISTANTS: none   ANESTHESIA:   local and general  EBL:  minimal   BLOOD ADMINISTERED:none  DRAINS: none   LOCAL MEDICATIONS USED:  MARCAINE     SPECIMEN:  No Specimen  DISPOSITION OF SPECIMEN:  N/A  COUNTS:  YES  TOURNIQUET:  * No tourniquets in log *  DICTATION: .Dragon Dictation   After informed consent was obtained the patient was brought to the operating room and placed in the supine position on the operating table.  A roll was placed between the patient's shoulder blades to extend the shoulder slightly.  After adequate induction of general anesthesia the patient's left chest and neck area were prepped with ChloraPrep, allowed to dry, and draped in usual sterile manner.  An appropriate timeout was performed.  The area lateral to the bend of the clavicle was infiltrated with quarter percent Marcaine on the left chest wall.  The patient was placed in Trendelenburg position.  The large bore needle from the Port-A-Cath kit was used to be slide beneath the bend of the clavicle on the left chest wall heading towards the sternal notch and in doing so I was able to access the left subclavian vein without difficulty.  The wire was fed through the needle using the Seldinger technique without difficulty.  The wire was confirmed in the central venous system using real-time fluoroscopy.  Next a small incision was made at the wire entry site on the left chest wall with a 15 blade knife.  The incision was carried through the skin and subcutaneous tissue sharply with the electrocautery.  A subcutaneous pocket was created inferior to the incision by blunt finger dissection.  Next the tubing was  placed on the reservoir.  The reservoir was placed in the pocket and the length of the tubing was estimated again using real-time fluoroscopy.  The tubing was cut to the appropriate length.  Next a sheath and dilator were fed over the wire using the Seldinger technique without difficulty.  The dilator and wire were removed and the tubing was fed through the sheath as far as it would go and held in place while the sheath was gently cracked and separated.  Another real-time fluoroscopy image showed the tip of the catheter to be in the distal superior vena cava.  The tubing was then permanently anchored to the reservoir.  The reservoir was anchored in the pocket with two 2-0 Prolene stitches.  The port was then aspirated and it aspirated blood easily.  The port was then flushed initially with a dilute heparin solution and then with a more concentrated heparin solution.  The subcutaneous tissue was closed over the port with interrupted 3-0 Vicryl stitches.  The skin was closed with a running 4-0 Monocryl subcuticular stitch.  Dermabond dressings were applied.  The patient tolerated the procedure well.  At the end of the case all needle sponge and instrument counts were correct.  The patient was then awakened and taken to recovery in stable condition.  PLAN OF CARE: Discharge to home after PACU  PATIENT DISPOSITION:  PACU - hemodynamically stable.   Delay start of Pharmacological VTE agent (>24hrs) due to surgical blood loss or  risk of bleeding: not applicable

## 2018-11-07 NOTE — Interval H&P Note (Signed)
History and Physical Interval Note:  11/07/2018 8:22 AM  Judith Thomas  has presented today for surgery, with the diagnosis of RIGHT BREAST CANCER  The various methods of treatment have been discussed with the patient and family. After consideration of risks, benefits and other options for treatment, the patient has consented to  Procedure(s): INSERTION PORT-A-CATH WITH ULTRASOUND (N/A) as a surgical intervention .  The patient's history has been reviewed, patient examined, no change in status, stable for surgery.  I have reviewed the patient's chart and labs.  Questions were answered to the patient's satisfaction.     [name]

## 2018-11-07 NOTE — Transfer of Care (Signed)
Immediate Anesthesia Transfer of Care Note  Patient: Judith Thomas  Procedure(s) Performed: INSERTION PORT-A-CATH WITH ULTRASOUND (Left Chest)  Patient Location: PACU  Anesthesia Type:General  Level of Consciousness: awake, alert , oriented and patient cooperative  Airway & Oxygen Therapy: Patient Spontanous Breathing and Patient connected to face mask oxygen  Post-op Assessment: Report given to RN and Post -op Vital signs reviewed and stable  Post vital signs: Reviewed and stable  Last Vitals:  Vitals Value Taken Time  BP 122/72 11/07/2018 10:56 AM  Temp    Pulse 104 11/07/2018 10:56 AM  Resp    SpO2 100 % 11/07/2018 10:56 AM  Vitals shown include unvalidated device data.  Last Pain:  Vitals:   11/07/18 0822  TempSrc: Oral  PainSc: 0-No pain         Complications: No apparent anesthesia complications

## 2018-11-07 NOTE — Interval H&P Note (Signed)
History and Physical Interval Note:  11/07/2018 9:51 AM  Judith Thomas  has presented today for surgery, with the diagnosis of RIGHT BREAST CANCER  The various methods of treatment have been discussed with the patient and family. After consideration of risks, benefits and other options for treatment, the patient has consented to  Procedure(s): INSERTION PORT-A-CATH WITH ULTRASOUND (N/A) as a surgical intervention .  The patient's history has been reviewed, patient examined, no change in status, stable for surgery.  I have reviewed the patient's chart and labs.  Questions were answered to the patient's satisfaction.     [name]

## 2018-11-07 NOTE — Anesthesia Procedure Notes (Signed)
Procedure Name: LMA Insertion Date/Time: 11/07/2018 10:12 AM Performed by: Signe Colt, CRNA Pre-anesthesia Checklist: Patient identified, Emergency Drugs available, Suction available and Patient being monitored Patient Re-evaluated:Patient Re-evaluated prior to induction Oxygen Delivery Method: Circle system utilized Preoxygenation: Pre-oxygenation with 100% oxygen Induction Type: IV induction Ventilation: Mask ventilation without difficulty LMA: LMA inserted LMA Size: 4.0 Number of attempts: 1 Airway Equipment and Method: Bite block Placement Confirmation: positive ETCO2 Tube secured with: Tape Dental Injury: Teeth and Oropharynx as per pre-operative assessment

## 2018-11-08 ENCOUNTER — Inpatient Hospital Stay: Payer: BLUE CROSS/BLUE SHIELD

## 2018-11-08 ENCOUNTER — Other Ambulatory Visit: Payer: Self-pay

## 2018-11-08 ENCOUNTER — Inpatient Hospital Stay (HOSPITAL_BASED_OUTPATIENT_CLINIC_OR_DEPARTMENT_OTHER): Payer: BLUE CROSS/BLUE SHIELD | Admitting: Hematology and Oncology

## 2018-11-08 ENCOUNTER — Inpatient Hospital Stay: Payer: BLUE CROSS/BLUE SHIELD | Admitting: Medical

## 2018-11-08 ENCOUNTER — Encounter (HOSPITAL_BASED_OUTPATIENT_CLINIC_OR_DEPARTMENT_OTHER): Payer: Self-pay | Admitting: General Surgery

## 2018-11-08 VITALS — BP 151/91 | HR 81 | Resp 16

## 2018-11-08 DIAGNOSIS — Z5112 Encounter for antineoplastic immunotherapy: Secondary | ICD-10-CM | POA: Diagnosis not present

## 2018-11-08 DIAGNOSIS — C50211 Malignant neoplasm of upper-inner quadrant of right female breast: Secondary | ICD-10-CM

## 2018-11-08 DIAGNOSIS — Z803 Family history of malignant neoplasm of breast: Secondary | ICD-10-CM | POA: Diagnosis not present

## 2018-11-08 DIAGNOSIS — Z170 Estrogen receptor positive status [ER+]: Secondary | ICD-10-CM | POA: Diagnosis not present

## 2018-11-08 DIAGNOSIS — T8090XA Unspecified complication following infusion and therapeutic injection, initial encounter: Secondary | ICD-10-CM

## 2018-11-08 DIAGNOSIS — Z5111 Encounter for antineoplastic chemotherapy: Secondary | ICD-10-CM | POA: Diagnosis not present

## 2018-11-08 LAB — CBC WITH DIFFERENTIAL (CANCER CENTER ONLY)
Abs Immature Granulocytes: 0.02 10*3/uL (ref 0.00–0.07)
BASOS ABS: 0 10*3/uL (ref 0.0–0.1)
Basophils Relative: 0 %
EOS ABS: 0 10*3/uL (ref 0.0–0.5)
Eosinophils Relative: 0 %
HCT: 37.2 % (ref 36.0–46.0)
Hemoglobin: 12.1 g/dL (ref 12.0–15.0)
Immature Granulocytes: 0 %
Lymphocytes Relative: 22 %
Lymphs Abs: 2 10*3/uL (ref 0.7–4.0)
MCH: 31.3 pg (ref 26.0–34.0)
MCHC: 32.5 g/dL (ref 30.0–36.0)
MCV: 96.4 fL (ref 80.0–100.0)
Monocytes Absolute: 0.5 10*3/uL (ref 0.1–1.0)
Monocytes Relative: 6 %
NEUTROS PCT: 72 %
Neutro Abs: 6.5 10*3/uL (ref 1.7–7.7)
Platelet Count: 261 10*3/uL (ref 150–400)
RBC: 3.86 MIL/uL — ABNORMAL LOW (ref 3.87–5.11)
RDW: 13 % (ref 11.5–15.5)
WBC Count: 9.1 10*3/uL (ref 4.0–10.5)
nRBC: 0 % (ref 0.0–0.2)

## 2018-11-08 LAB — CMP (CANCER CENTER ONLY)
ALT: 9 U/L (ref 0–44)
AST: 11 U/L — ABNORMAL LOW (ref 15–41)
Albumin: 3.9 g/dL (ref 3.5–5.0)
Alkaline Phosphatase: 102 U/L (ref 38–126)
Anion gap: 10 (ref 5–15)
BUN: 20 mg/dL (ref 8–23)
CO2: 25 mmol/L (ref 22–32)
Calcium: 9.4 mg/dL (ref 8.9–10.3)
Chloride: 106 mmol/L (ref 98–111)
Creatinine: 0.75 mg/dL (ref 0.44–1.00)
GFR, Est AFR Am: >60 mL/min (ref >60)
GFR, Estimated: >60 mL/min (ref >60)
Glucose, Bld: 91 mg/dL (ref 70–99)
Potassium: 4.2 mmol/L (ref 3.5–5.1)
Sodium: 141 mmol/L (ref 135–145)
Total Bilirubin: 0.4 mg/dL (ref 0.3–1.2)
Total Protein: 7.2 g/dL (ref 6.5–8.1)

## 2018-11-08 LAB — RESEARCH LABS

## 2018-11-08 MED ORDER — DIPHENHYDRAMINE HCL 50 MG/ML IJ SOLN
INTRAMUSCULAR | Status: AC
  Filled 2018-11-08: qty 1

## 2018-11-08 MED ORDER — DIPHENHYDRAMINE HCL 25 MG PO CAPS
ORAL_CAPSULE | ORAL | Status: AC
  Filled 2018-11-08: qty 1

## 2018-11-08 MED ORDER — DEXAMETHASONE SODIUM PHOSPHATE 10 MG/ML IJ SOLN
INTRAMUSCULAR | Status: AC
  Filled 2018-11-08: qty 1

## 2018-11-08 MED ORDER — SODIUM CHLORIDE 0.9% FLUSH
10.0000 mL | INTRAVENOUS | Status: DC | PRN
  Administered 2018-11-08: 10 mL
  Filled 2018-11-08: qty 10

## 2018-11-08 MED ORDER — DEXAMETHASONE SODIUM PHOSPHATE 10 MG/ML IJ SOLN
10.0000 mg | Freq: Once | INTRAMUSCULAR | Status: AC
  Administered 2018-11-08: 10 mg via INTRAVENOUS

## 2018-11-08 MED ORDER — FAMOTIDINE IN NACL 20-0.9 MG/50ML-% IV SOLN
INTRAVENOUS | Status: AC
  Filled 2018-11-08: qty 50

## 2018-11-08 MED ORDER — TRASTUZUMAB CHEMO 150 MG IV SOLR
4.0000 mg/kg | Freq: Once | INTRAVENOUS | Status: AC
  Administered 2018-11-08: 336 mg via INTRAVENOUS
  Filled 2018-11-08: qty 16

## 2018-11-08 MED ORDER — SODIUM CHLORIDE 0.9 % IV SOLN: 10.0000 mg | Freq: Once | INTRAVENOUS | Status: DC

## 2018-11-08 MED ORDER — DIPHENHYDRAMINE HCL 50 MG/ML IJ SOLN
25.0000 mg | Freq: Once | INTRAMUSCULAR | Status: AC
  Administered 2018-11-08: 25 mg via INTRAVENOUS

## 2018-11-08 MED ORDER — ONDANSETRON HCL 4 MG/2ML IJ SOLN
8.0000 mg | Freq: Once | INTRAMUSCULAR | Status: AC
  Administered 2018-11-08: 8 mg via INTRAVENOUS

## 2018-11-08 MED ORDER — ACETAMINOPHEN 325 MG PO TABS
650.0000 mg | ORAL_TABLET | Freq: Once | ORAL | Status: AC
  Administered 2018-11-08: 650 mg via ORAL

## 2018-11-08 MED ORDER — ACETAMINOPHEN 325 MG PO TABS
ORAL_TABLET | ORAL | Status: AC
  Filled 2018-11-08: qty 2

## 2018-11-08 MED ORDER — SODIUM CHLORIDE 0.9 % IV SOLN
80.0000 mg/m2 | Freq: Once | INTRAVENOUS | Status: AC
  Administered 2018-11-08: 156 mg via INTRAVENOUS
  Filled 2018-11-08: qty 26

## 2018-11-08 MED ORDER — FAMOTIDINE IN NACL 20-0.9 MG/50ML-% IV SOLN
20.0000 mg | Freq: Once | INTRAVENOUS | Status: AC
  Administered 2018-11-08: 20 mg via INTRAVENOUS

## 2018-11-08 MED ORDER — SODIUM CHLORIDE 0.9 % IV SOLN: 8.0000 mg | Freq: Once | INTRAVENOUS | Status: DC

## 2018-11-08 MED ORDER — HEPARIN SOD (PORK) LOCK FLUSH 100 UNIT/ML IV SOLN
500.0000 [IU] | Freq: Once | INTRAVENOUS | Status: AC | PRN
  Administered 2018-11-08: 500 [IU]
  Filled 2018-11-08: qty 5

## 2018-11-08 MED ORDER — DIPHENHYDRAMINE HCL 50 MG/ML IJ SOLN
25.0000 mg | Freq: Once | INTRAMUSCULAR | Status: AC | PRN
  Administered 2018-11-08: 25 mg via INTRAVENOUS

## 2018-11-08 MED ORDER — SODIUM CHLORIDE 0.9 % IV SOLN
Freq: Once | INTRAVENOUS | Status: AC
  Administered 2018-11-08: 10:00:00 via INTRAVENOUS
  Filled 2018-11-08: qty 250

## 2018-11-08 MED ORDER — ONDANSETRON HCL 4 MG/2ML IJ SOLN
INTRAMUSCULAR | Status: AC
  Filled 2018-11-08: qty 4

## 2018-11-08 MED ORDER — FAMOTIDINE IN NACL 20-0.9 MG/50ML-% IV SOLN
20.0000 mg | Freq: Once | INTRAVENOUS | Status: AC | PRN
  Administered 2018-11-08: 20 mg via INTRAVENOUS

## 2018-11-08 NOTE — Anesthesia Postprocedure Evaluation (Signed)
Anesthesia Post Note  Patient: Judith Thomas  Procedure(s) Performed: INSERTION PORT-A-CATH WITH ULTRASOUND (Left Chest)     Patient location during evaluation: PACU Anesthesia Type: General Level of consciousness: awake and alert Pain management: pain level controlled Vital Signs Assessment: post-procedure vital signs reviewed and stable Respiratory status: spontaneous breathing, nonlabored ventilation, respiratory function stable and patient connected to nasal cannula oxygen Cardiovascular status: blood pressure returned to baseline and stable Postop Assessment: no apparent nausea or vomiting Anesthetic complications: no    Last Vitals:  Vitals:   11/07/18 1130 11/07/18 1145  BP: 126/81 134/85  Pulse: 84 84  Resp: 12 16  Temp:  36.6 C  SpO2: 100% 100%    Last Pain:  Vitals:   11/07/18 1145  TempSrc:   PainSc: 2                  Arrian Manson L Naira Standiford

## 2018-11-08 NOTE — Patient Instructions (Addendum)
[facility] Discharge Instructions for Patients Receiving Chemotherapy  Today you received the following chemotherapy agents:  Herceptin and Taxol.  To help prevent nausea and vomiting after your treatment, we encourage you to take your nausea medication as directed.   If you develop nausea and vomiting that is not controlled by your nausea medication, call the clinic.   BELOW ARE SYMPTOMS THAT SHOULD BE REPORTED IMMEDIATELY:  *FEVER GREATER THAN 100.5 F  *CHILLS WITH OR WITHOUT FEVER  NAUSEA AND VOMITING THAT IS NOT CONTROLLED WITH YOUR NAUSEA MEDICATION  *UNUSUAL SHORTNESS OF BREATH  *UNUSUAL BRUISING OR BLEEDING  TENDERNESS IN MOUTH AND THROAT WITH OR WITHOUT PRESENCE OF ULCERS  *URINARY PROBLEMS  *BOWEL PROBLEMS  UNUSUAL RASH Items with * indicate a potential emergency and should be followed up as soon as possible.  Feel free to call the clinic should you have any questions or concerns. The clinic phone number is [phone].  Please show the [facility] at check-in to the Emergency Department and triage nurse.  Trastuzumab injection for infusion What is this medicine? TRASTUZUMAB (tras TOO zoo mab) is a monoclonal antibody. It is used to treat breast cancer and stomach cancer. This medicine may be used for other purposes; ask your health care provider or pharmacist if you have questions. COMMON BRAND NAME(S): Herceptin, Calla Kicks, OGIVRI What should I tell my health care provider before I take this medicine? They need to know if you have any of these conditions: -heart disease -heart failure -lung or breathing disease, like asthma -an unusual or allergic reaction to trastuzumab, benzyl alcohol, or other medications, foods, dyes, or preservatives -pregnant or trying to get pregnant -breast-feeding How should I use this medicine? This drug is given as an infusion into a vein. It is administered in a hospital or clinic by a specially trained  health care professional. Talk to your pediatrician regarding the use of this medicine in children. This medicine is not approved for use in children. Overdosage: If you think you have taken too much of this medicine contact a poison control center or emergency room at once. NOTE: This medicine is only for you. Do not share this medicine with others. What if I miss a dose? It is important not to miss a dose. Call your doctor or health care professional if you are unable to keep an appointment. What may interact with this medicine? This medicine may interact with the following medications: -certain types of chemotherapy, such as daunorubicin, doxorubicin, epirubicin, and idarubicin This list may not describe all possible interactions. Give your health care provider a list of all the medicines, herbs, non-prescription drugs, or dietary supplements you use. Also tell them if you smoke, drink alcohol, or use illegal drugs. Some items may interact with your medicine. What should I watch for while using this medicine? Visit your doctor for checks on your progress. Report any side effects. Continue your course of treatment even though you feel ill unless your doctor tells you to stop. Call your doctor or health care professional for advice if you get a fever, chills or sore throat, or other symptoms of a cold or flu. Do not treat yourself. Try to avoid being around people who are sick. You may experience fever, chills and shaking during your first infusion. These effects are usually mild and can be treated with other medicines. Report any side effects during the infusion to your health care professional. Fever and chills usually do not happen with later infusions.  Do not become pregnant while taking this medicine or for 7 months after stopping it. Women should inform their doctor if they wish to become pregnant or think they might be pregnant. Women of child-bearing potential will need to have a negative  pregnancy test before starting this medicine. There is a potential for serious side effects to an unborn child. Talk to your health care professional or pharmacist for more information. Do not breast-feed an infant while taking this medicine or for 7 months after stopping it. Women must use effective birth control with this medicine. What side effects may I notice from receiving this medicine? Side effects that you should report to your doctor or health care professional as soon as possible: -allergic reactions like skin rash, itching or hives, swelling of the face, lips, or tongue -chest pain or palpitations -cough -dizziness -feeling faint or lightheaded, falls -fever -general ill feeling or flu-like symptoms -signs of worsening heart failure like breathing problems; swelling in your legs and feet -unusually weak or tired Side effects that usually do not require medical attention (report to your doctor or health care professional if they continue or are bothersome): -bone pain -changes in taste -diarrhea -joint pain -nausea/vomiting -weight loss This list may not describe all possible side effects. Call your doctor for medical advice about side effects. You may report side effects to FDA at 1-800-FDA-1088. Where should I keep my medicine? This drug is given in a hospital or clinic and will not be stored at home. NOTE: This sheet is a summary. It may not cover all possible information. If you have questions about this medicine, talk to your doctor, pharmacist, or health care provider.  2019 Elsevier/Gold Standard (2016-09-20 14:37:52)  Paclitaxel injection What is this medicine? PACLITAXEL (PAK li TAX el) is a chemotherapy drug. It targets fast dividing cells, like cancer cells, and causes these cells to die. This medicine is used to treat ovarian cancer, breast cancer, lung cancer, Kaposi's sarcoma, and other cancers. This medicine may be used for other purposes; ask your health care  provider or pharmacist if you have questions. COMMON BRAND NAME(S): Onxol, Taxol What should I tell my health care provider before I take this medicine? They need to know if you have any of these conditions: -history of irregular heartbeat -liver disease -low blood counts, like low white cell, platelet, or red cell counts -lung or breathing disease, like asthma -tingling of the fingers or toes, or other nerve disorder -an unusual or allergic reaction to paclitaxel, alcohol, polyoxyethylated castor oil, other chemotherapy, other medicines, foods, dyes, or preservatives -pregnant or trying to get pregnant -breast-feeding How should I use this medicine? This drug is given as an infusion into a vein. It is administered in a hospital or clinic by a specially trained health care professional. Talk to your pediatrician regarding the use of this medicine in children. Special care may be needed. Overdosage: If you think you have taken too much of this medicine contact a poison control center or emergency room at once. NOTE: This medicine is only for you. Do not share this medicine with others. What if I miss a dose? It is important not to miss your dose. Call your doctor or health care professional if you are unable to keep an appointment. What may interact with this medicine? Do not take this medicine with any of the following medications: -disulfiram -metronidazole This medicine may also interact with the following medications: -antiviral medicines for hepatitis, HIV or AIDS -certain antibiotics like  erythromycin and clarithromycin -certain medicines for fungal infections like ketoconazole and itraconazole -certain medicines for seizures like carbamazepine, phenobarbital, phenytoin -gemfibrozil -nefazodone -rifampin -St. John's wort This list may not describe all possible interactions. Give your health care provider a list of all the medicines, herbs, non-prescription drugs, or dietary  supplements you use. Also tell them if you smoke, drink alcohol, or use illegal drugs. Some items may interact with your medicine. What should I watch for while using this medicine? Your condition will be monitored carefully while you are receiving this medicine. You will need important blood work done while you are taking this medicine. This medicine can cause serious allergic reactions. To reduce your risk you will need to take other medicine(s) before treatment with this medicine. If you experience allergic reactions like skin rash, itching or hives, swelling of the face, lips, or tongue, tell your doctor or health care professional right away. In some cases, you may be given additional medicines to help with side effects. Follow all directions for their use. This drug may make you feel generally unwell. This is not uncommon, as chemotherapy can affect healthy cells as well as cancer cells. Report any side effects. Continue your course of treatment even though you feel ill unless your doctor tells you to stop. Call your doctor or health care professional for advice if you get a fever, chills or sore throat, or other symptoms of a cold or flu. Do not treat yourself. This drug decreases your body's ability to fight infections. Try to avoid being around people who are sick. This medicine may increase your risk to bruise or bleed. Call your doctor or health care professional if you notice any unusual bleeding. Be careful brushing and flossing your teeth or using a toothpick because you may get an infection or bleed more easily. If you have any dental work done, tell your dentist you are receiving this medicine. Avoid taking products that contain aspirin, acetaminophen, ibuprofen, naproxen, or ketoprofen unless instructed by your doctor. These medicines may hide a fever. Do not become pregnant while taking this medicine. Women should inform their doctor if they wish to become pregnant or think they might be  pregnant. There is a potential for serious side effects to an unborn child. Talk to your health care professional or pharmacist for more information. Do not breast-feed an infant while taking this medicine. Men are advised not to father a child while receiving this medicine. This product may contain alcohol. Ask your pharmacist or healthcare provider if this medicine contains alcohol. Be sure to tell all healthcare providers you are taking this medicine. Certain medicines, like metronidazole and disulfiram, can cause an unpleasant reaction when taken with alcohol. The reaction includes flushing, headache, nausea, vomiting, sweating, and increased thirst. The reaction can last from 30 minutes to several hours. What side effects may I notice from receiving this medicine? Side effects that you should report to your doctor or health care professional as soon as possible: -allergic reactions like skin rash, itching or hives, swelling of the face, lips, or tongue -breathing problems -changes in vision -fast, irregular heartbeat -high or low blood pressure -mouth sores -pain, tingling, numbness in the hands or feet -signs of decreased platelets or bleeding - bruising, pinpoint red spots on the skin, black, tarry stools, blood in the urine -signs of decreased red blood cells - unusually weak or tired, feeling faint or lightheaded, falls -signs of infection - fever or chills, cough, sore throat, pain or difficulty passing  urine -signs and symptoms of liver injury like dark yellow or brown urine; general ill feeling or flu-like symptoms; light-colored stools; loss of appetite; nausea; right upper belly pain; unusually weak or tired; yellowing of the eyes or skin -swelling of the ankles, feet, hands -unusually slow heartbeat Side effects that usually do not require medical attention (report to your doctor or health care professional if they continue or are bothersome): -diarrhea -hair loss -loss of  appetite -muscle or joint pain -nausea, vomiting -pain, redness, or irritation at site where injected -tiredness This list may not describe all possible side effects. Call your doctor for medical advice about side effects. You may report side effects to FDA at 1-800-FDA-1088. Where should I keep my medicine? This drug is given in a hospital or clinic and will not be stored at home. NOTE: This sheet is a summary. It may not cover all possible information. If you have questions about this medicine, talk to your doctor, pharmacist, or health care provider.  2019 Elsevier/Gold Standard (2017-05-30 13:14:55)

## 2018-11-08 NOTE — Research (Signed)
UPBEAT Baseline 0915  Labs- Pt confirmed she was fasting for 3 hours before labs. Labs were collected per protocol.  Vitals- Pt's weight and height were collected. Her weight was taken with her shoes off and no heavy clothing. Pt rested comfortable for a full five minutes before BP was taken and at least one minute elapsed before the second blood pressure was taken.   Gift Card- Once all baseline activities had been completed, pt was given gift card for her participation.  Upcoming Visit- Told pt I would contact her by phone for her 1 month lab visit.   Patient was thanked for her participation in trial.  Johney Maine RN, BSN Clinical Research  11/08/2018 1322

## 2018-11-08 NOTE — Assessment & Plan Note (Signed)
09/27/19: Two right breast masses 12:00 to 1 o'clock position 1.3 cm and 1.2 cm, they are 1.5 cm apart.  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 Neoadjuvant chemotherapy with Taxol Herceptin weekly x12 followed by Herceptin maintenance vs Kadcyla for 1 year 2. Followed by breast conserving surgery if possible with sentinel lymph node study 3. Followed by adjuvant radiation therapy if patient had lumpectomy 4.  Followed by antiestrogen therapy ------------------------------------------------------------------------------------------------------------------------------------------------------------ Current treatment: Cycle 1 day 1 Taxol Herceptin Echocardiogram 10/29/2018: EF 60 to 65% Labs reviewed Chemo education completed Chemo consent obtained Return to clinic in 1 week for toxicity check and for cycle 2

## 2018-11-08 NOTE — Patient Instructions (Signed)

## 2018-11-08 NOTE — Progress Notes (Signed)
During paclitaxel infusion, patient began c/o ears burning and facial flushing. Paclitaxel paused, hypersensitivity protocol initiated, and Sandi Mealy called to infusion room. Patient verbalized rapid improvement in condition and returned to baseline prior to resuming paclitaxel infusion at 50% of initial rate. Patient tolerated well and rate increased to goal rate. Patient completed remainder of dose and discharged without further incident.

## 2018-11-09 ENCOUNTER — Telehealth: Payer: Self-pay

## 2018-11-09 NOTE — Telephone Encounter (Signed)
Nurse spoke with patient regarding 1st time Taxol and Herceptin on 11/08/2018.  Pt denies any fever and or nausea.  Pt reports feeling well.  Tolerating fluids.  Nurse answered questions.  No further needs at this time.

## 2018-11-14 NOTE — Progress Notes (Signed)
    DATE:  11/08/2018                                          X CHEMO/IMMUNOTHERAPY REACTION            MD:  Dr. Nicholas Lose   AGENT/BLOOD PRODUCT RECEIVING TODAY:               Paclitaxel and trastuzumab   AGENT/BLOOD PRODUCT RECEIVING IMMEDIATELY PRIOR TO REACTION:           Paclitaxel   VS: BP:      159/95   P:        79       SPO2:        96% on room air                  REACTION(S):            Facial warmth   PREMEDS:      Benadryl 25 mg, dexamethasone 10 mg, Zofran, Pepcid 20 mg, and Tylenol 650 mg   INTERVENTION: Benadryl 25 mg IV x1   Review of Systems  Review of Systems  Constitutional: Negative for chills, diaphoresis and fever.  HENT: Negative for trouble swallowing and voice change.        Facial warmth  Respiratory: Negative for cough, chest tightness, shortness of breath and wheezing.   Cardiovascular: Negative for chest pain and palpitations.  Gastrointestinal: Negative for abdominal pain, constipation, diarrhea, nausea and vomiting.  Musculoskeletal: Negative for back pain and myalgias.  Neurological: Negative for dizziness, light-headedness and headaches.     Physical Exam  Physical Exam Constitutional:      General: She is not in acute distress.    Appearance: She is not diaphoretic.  HENT:     Head: Normocephalic and atraumatic.  Cardiovascular:     Rate and Rhythm: Normal rate and regular rhythm.     Heart sounds: Normal heart sounds. No murmur. No friction rub. No gallop.   Pulmonary:     Effort: Pulmonary effort is normal. No respiratory distress.     Breath sounds: Normal breath sounds. No wheezing or rales.  Skin:    General: Skin is warm and dry.     Findings: No erythema or rash.  Neurological:     Mental Status: She is alert.     OUTCOME:                 The patient's symptoms abated after dosing with IV Benadryl.  She was able to complete the remainder of her infusion without additional issues of concern.   Sandi Mealy, MHS, PA-C

## 2018-11-14 NOTE — Progress Notes (Signed)
Patient Care Team: Chevis Pretty, FNP as PCP - General (Nurse Practitioner)  DIAGNOSIS:    ICD-10-CM   1. Malignant neoplasm of upper-inner quadrant of right breast in female, estrogen receptor positive ([facility]) C50.211    Z17.0     SUMMARY OF ONCOLOGIC HISTORY:   Malignant neoplasm of upper-inner quadrant of right breast in female, estrogen receptor positive ([facility])   09/26/2018 Initial Diagnosis    Two right breast masses 12:00 to 1 o'clock position 1.3 cm and 1.2 cm, they are 1.5 cm apart.  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 with above-mentioned history of right breast cancer who is currently on chemotherapy with weekly Taxol and Herceptin. Her breast MRI from 11/05/18 showed no lymph node involvement. She presents to the clinic today with her husband. At her last treatment she has facial flushing and heat. She reports normal appetite and taste and denies nausea or neuropathy. She reports constipation for 3 days. Her lab work from today shows: WBC 4.8, Hg 11.6, platelets 257, ANC 3.1.   REVIEW OF SYSTEMS:   Constitutional: Denies fevers, chills or abnormal weight loss Eyes: Denies blurriness of vision Ears, nose, mouth, throat, and face: Denies mucositis or sore throat Respiratory: Denies cough, dyspnea or wheezes Cardiovascular: Denies palpitation, chest  discomfort Gastrointestinal: Denies nausea, heartburn (+) constipation, 3 days Skin: Denies abnormal skin rashes (+) facial flushing and warmth Lymphatics: Denies new lymphadenopathy or easy bruising Neurological: Denies numbness, tingling or new weaknesses Behavioral/Psych: Mood is stable, no new changes  Extremities: No lower extremity edema Breast: denies any pain or lumps or nodules in either breasts All other systems were reviewed with the patient and are negative.  I have reviewed the past medical history, past surgical history, social history and family history with the patient and they are unchanged from previous note.  ALLERGIES:  has No Known Allergies.  MEDICATIONS:  Current Outpatient Medications  Medication Sig Dispense Refill  . HYDROcodone-acetaminophen (NORCO/VICODIN) 5-325 MG tablet Take 1-2 tablets by mouth every 6 (six) hours as needed for moderate pain or severe pain. 10 tablet 0  . lidocaine-prilocaine (EMLA) cream Apply to affected area once 30 g 3  . Multiple Vitamin (MULTIVITAMIN) tablet Take 1 tablet by mouth daily.     . ondansetron (ZOFRAN) 8 MG tablet Take 1 tablet (8 mg total) by mouth 2 (two) times daily as needed (Nausea or vomiting). 30 tablet 1  . prochlorperazine (COMPAZINE) 10 MG tablet Take 1 tablet (10 mg total) by mouth every 6 (six) hours as needed (Nausea or vomiting). 30 tablet 1   No current facility-administered medications for this visit.     PHYSICAL EXAMINATION: ECOG PERFORMANCE STATUS: 1 - Symptomatic but completely ambulatory  Vitals:   11/15/18 1115  BP: 132/79  Pulse: 75  Resp: 16  Temp: 98.3 F (36.8 C)  SpO2: 98%   Filed Weights   11/15/18 1115  Weight: 185 lb 1.6 oz (84 kg)    GENERAL: alert, no distress and comfortable SKIN: skin color, texture, turgor are normal, no rashes or significant lesions EYES: normal, Conjunctiva are pink and non-injected, sclera clear OROPHARYNX: no exudate, no erythema and lips, buccal  mucosa, and tongue normal  NECK: supple, thyroid normal size, non-tender, without nodularity LYMPH: no palpable lymphadenopathy in the cervical, axillary or inguinal LUNGS: clear to auscultation and percussion with normal breathing effort HEART: regular rate & rhythm and no murmurs and no lower extremity edema ABDOMEN: abdomen soft, non-tender and normal bowel sounds MUSCULOSKELETAL: no cyanosis of digits and no clubbing  NEURO: alert & oriented x 3 with fluent speech, no focal motor/sensory deficits EXTREMITIES: No lower extremity edema  LABORATORY DATA:  I have reviewed the data as listed CMP Latest Ref Rng & Units 11/08/2018 08/15/2018 07/21/2017  Glucose 70 - 99 mg/dL 91 93 85  BUN 8 - 23 mg/dL _0 Creatinine 0.44 - 1.00 mg/dL 0.75 0.80 0.73  Sodium 135 - 145 mmol/L 141 142 143  Potassium 3.5 - 5.1 mmol/L 4.2 4.8 4.4  Chloride 98 - 111 mmol/L 106 103 104  CO2 22 - 32 mmol/L 25 19(L) 25  Calcium 8.9 - 10.3 mg/dL 9.4 9.5 9.2  Total Protein 6.5 - 8.1 g/dL 7.2 7.2 7.2  Total Bilirubin 0.3 - 1.2 mg/dL 0.4 0.6 0.7  Alkaline Phos 38 - 126 U/L 102 105 109  AST 15 - 41 U/L 11(L) 18 19  ALT 0 - 44 U/L _1 Lab Results  Component Value Date   WBC 4.8 11/15/2018   HGB 11.6 (L) 11/15/2018   HCT 35.8 (L) 11/15/2018   MCV 96.5 11/15/2018   PLT 257 11/15/2018   NEUTROABS 3.1 11/15/2018    ASSESSMENT & PLAN:  Malignant neoplasm of upper-inner quadrant of right breast in female, estrogen receptor positive ([facility]) Malignant neoplasm of upper-inner quadrant of right breast in female, estrogen receptor positive ([facility]) 09/27/19:Two right breast masses 12:00 to 1 o'clock position 1.3 cm and 1.2 cm, they are 1.5 cm apart. 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 Neoadjuvant chemotherapy withTaxol  Herceptin weekly x34fllowed by Herceptin maintenance vsKadcylafor 1 year 2. Followed by breast conserving surgery if possible with sentinel lymph node study 3. Followed by adjuvant radiation therapy if patient had lumpectomy 4.Followed by antiestrogen therapy ------------------------------------------------------------------------------------------------------------------------------------------------------------ Current treatment: Cycle 2 Taxol Herceptin Echocardiogram 10/29/2018: EF 60 to 65%  Chemo toxicities:  Facial warmth during infusions: Given Benadryl Return to clinic weekly for chemotherapy other week for follow-up with me     No orders of the defined types were placed in this encounter.  The patient has a good understanding of the overall plan. she agrees with it. she will call with any problems that may develop before the next visit here.  GNicholas Lose MD 11/15/2018  IJulious OkaDorshimer am acting as scribe for Dr. VNicholas Lose  I have reviewed the above documentation for accuracy and completeness, and I agree with the above.

## 2018-11-15 ENCOUNTER — Inpatient Hospital Stay: Payer: BLUE CROSS/BLUE SHIELD | Attending: Hematology and Oncology

## 2018-11-15 ENCOUNTER — Inpatient Hospital Stay (HOSPITAL_BASED_OUTPATIENT_CLINIC_OR_DEPARTMENT_OTHER): Payer: BLUE CROSS/BLUE SHIELD | Admitting: Hematology and Oncology

## 2018-11-15 ENCOUNTER — Inpatient Hospital Stay: Payer: BLUE CROSS/BLUE SHIELD

## 2018-11-15 ENCOUNTER — Inpatient Hospital Stay: Payer: BLUE CROSS/BLUE SHIELD | Admitting: Medical

## 2018-11-15 VITALS — BP 136/88 | HR 83 | Temp 98.4°F | Resp 17

## 2018-11-15 DIAGNOSIS — Z170 Estrogen receptor positive status [ER+]: Secondary | ICD-10-CM

## 2018-11-15 DIAGNOSIS — K5900 Constipation, unspecified: Secondary | ICD-10-CM | POA: Diagnosis not present

## 2018-11-15 DIAGNOSIS — Z5112 Encounter for antineoplastic immunotherapy: Secondary | ICD-10-CM | POA: Diagnosis not present

## 2018-11-15 DIAGNOSIS — T8090XA Unspecified complication following infusion and therapeutic injection, initial encounter: Secondary | ICD-10-CM

## 2018-11-15 DIAGNOSIS — Z5111 Encounter for antineoplastic chemotherapy: Secondary | ICD-10-CM | POA: Diagnosis not present

## 2018-11-15 DIAGNOSIS — C50211 Malignant neoplasm of upper-inner quadrant of right female breast: Secondary | ICD-10-CM

## 2018-11-15 DIAGNOSIS — Z95828 Presence of other vascular implants and grafts: Secondary | ICD-10-CM | POA: Insufficient documentation

## 2018-11-15 LAB — CMP (CANCER CENTER ONLY)
ALT: 16 U/L (ref 0–44)
AST: 12 U/L — ABNORMAL LOW (ref 15–41)
Albumin: 3.7 g/dL (ref 3.5–5.0)
Alkaline Phosphatase: 105 U/L (ref 38–126)
Anion gap: 7 (ref 5–15)
BUN: 14 mg/dL (ref 8–23)
CO2: 29 mmol/L (ref 22–32)
Calcium: 9.5 mg/dL (ref 8.9–10.3)
Chloride: 105 mmol/L (ref 98–111)
Creatinine: 0.76 mg/dL (ref 0.44–1.00)
GFR, Est AFR Am: >60 mL/min (ref >60)
GFR, Estimated: >60 mL/min (ref >60)
Glucose, Bld: 103 mg/dL — ABNORMAL HIGH (ref 70–99)
Potassium: 4.4 mmol/L (ref 3.5–5.1)
Sodium: 141 mmol/L (ref 135–145)
Total Bilirubin: 1 mg/dL (ref 0.3–1.2)
Total Protein: 6.6 g/dL (ref 6.5–8.1)

## 2018-11-15 LAB — CBC WITH DIFFERENTIAL (CANCER CENTER ONLY)
Abs Immature Granulocytes: 0.02 10*3/uL (ref 0.00–0.07)
Basophils Absolute: 0 10*3/uL (ref 0.0–0.1)
Basophils Relative: 0 %
Eosinophils Absolute: 0.1 10*3/uL (ref 0.0–0.5)
Eosinophils Relative: 3 %
HCT: 35.8 % — ABNORMAL LOW (ref 36.0–46.0)
Hemoglobin: 11.6 g/dL — ABNORMAL LOW (ref 12.0–15.0)
Immature Granulocytes: 0 %
Lymphocytes Relative: 26 %
Lymphs Abs: 1.2 10*3/uL (ref 0.7–4.0)
MCH: 31.3 pg (ref 26.0–34.0)
MCHC: 32.4 g/dL (ref 30.0–36.0)
MCV: 96.5 fL (ref 80.0–100.0)
Monocytes Absolute: 0.3 10*3/uL (ref 0.1–1.0)
Monocytes Relative: 6 %
Neutro Abs: 3.1 10*3/uL (ref 1.7–7.7)
Neutrophils Relative %: 65 %
Platelet Count: 257 10*3/uL (ref 150–400)
RBC: 3.71 MIL/uL — ABNORMAL LOW (ref 3.87–5.11)
RDW: 12.8 % (ref 11.5–15.5)
WBC Count: 4.8 10*3/uL (ref 4.0–10.5)
nRBC: 0 % (ref 0.0–0.2)

## 2018-11-15 MED ORDER — DIPHENHYDRAMINE HCL 25 MG PO CAPS
ORAL_CAPSULE | ORAL | Status: AC
  Filled 2018-11-15: qty 1

## 2018-11-15 MED ORDER — SODIUM CHLORIDE 0.9 % IV SOLN
Freq: Once | INTRAVENOUS | Status: AC
  Administered 2018-11-15: 13:00:00 via INTRAVENOUS
  Filled 2018-11-15: qty 250

## 2018-11-15 MED ORDER — DIPHENHYDRAMINE HCL 50 MG/ML IJ SOLN
25.0000 mg | Freq: Once | INTRAMUSCULAR | Status: AC
  Administered 2018-11-15: 25 mg via INTRAVENOUS

## 2018-11-15 MED ORDER — DIPHENHYDRAMINE HCL 50 MG/ML IJ SOLN
INTRAMUSCULAR | Status: AC
  Filled 2018-11-15: qty 1

## 2018-11-15 MED ORDER — SODIUM CHLORIDE 0.9% FLUSH
10.0000 mL | INTRAVENOUS | Status: DC | PRN
  Administered 2018-11-15: 10 mL
  Filled 2018-11-15: qty 10

## 2018-11-15 MED ORDER — DEXAMETHASONE SODIUM PHOSPHATE 10 MG/ML IJ SOLN
INTRAMUSCULAR | Status: AC
  Filled 2018-11-15: qty 1

## 2018-11-15 MED ORDER — FAMOTIDINE IN NACL 20-0.9 MG/50ML-% IV SOLN
20.0000 mg | Freq: Once | INTRAVENOUS | Status: AC | PRN
  Administered 2018-11-15: 20 mg via INTRAVENOUS

## 2018-11-15 MED ORDER — ACETAMINOPHEN 325 MG PO TABS
ORAL_TABLET | ORAL | Status: AC
  Filled 2018-11-15: qty 2

## 2018-11-15 MED ORDER — HEPARIN SOD (PORK) LOCK FLUSH 100 UNIT/ML IV SOLN
500.0000 [IU] | Freq: Once | INTRAVENOUS | Status: AC | PRN
  Administered 2018-11-15: 500 [IU]
  Filled 2018-11-15: qty 5

## 2018-11-15 MED ORDER — DEXAMETHASONE SODIUM PHOSPHATE 10 MG/ML IJ SOLN
10.0000 mg | Freq: Once | INTRAMUSCULAR | Status: AC
  Administered 2018-11-15: 10 mg via INTRAVENOUS

## 2018-11-15 MED ORDER — ONDANSETRON HCL 4 MG/2ML IJ SOLN
INTRAMUSCULAR | Status: AC
  Filled 2018-11-15: qty 4

## 2018-11-15 MED ORDER — FAMOTIDINE IN NACL 20-0.9 MG/50ML-% IV SOLN
20.0000 mg | Freq: Once | INTRAVENOUS | Status: AC
  Administered 2018-11-15: 20 mg via INTRAVENOUS

## 2018-11-15 MED ORDER — SODIUM CHLORIDE 0.9 % IV SOLN
80.0000 mg/m2 | Freq: Once | INTRAVENOUS | Status: AC
  Administered 2018-11-15: 156 mg via INTRAVENOUS
  Filled 2018-11-15: qty 26

## 2018-11-15 MED ORDER — ONDANSETRON HCL 4 MG/2ML IJ SOLN
8.0000 mg | Freq: Once | INTRAMUSCULAR | Status: AC
  Administered 2018-11-15: 8 mg via INTRAVENOUS

## 2018-11-15 MED ORDER — SODIUM CHLORIDE 0.9% FLUSH
10.0000 mL | Freq: Once | INTRAVENOUS | Status: AC
  Administered 2018-11-15: 10 mL
  Filled 2018-11-15: qty 10

## 2018-11-15 MED ORDER — DIPHENHYDRAMINE HCL 50 MG/ML IJ SOLN
25.0000 mg | Freq: Once | INTRAMUSCULAR | Status: AC | PRN
  Administered 2018-11-15: 25 mg via INTRAVENOUS

## 2018-11-15 MED ORDER — TRASTUZUMAB CHEMO 150 MG IV SOLR
2.0000 mg/kg | Freq: Once | INTRAVENOUS | Status: AC
  Administered 2018-11-15: 168 mg via INTRAVENOUS
  Filled 2018-11-15: qty 8

## 2018-11-15 MED ORDER — ACETAMINOPHEN 325 MG PO TABS
650.0000 mg | ORAL_TABLET | Freq: Once | ORAL | Status: AC
  Administered 2018-11-15: 650 mg via ORAL

## 2018-11-15 MED ORDER — FAMOTIDINE IN NACL 20-0.9 MG/50ML-% IV SOLN
INTRAVENOUS | Status: AC
  Filled 2018-11-15: qty 50

## 2018-11-15 NOTE — Progress Notes (Signed)
    DATE:  11/15/2018                                          X CHEMO/IMMUNOTHERAPY REACTION            MD: Dr. Nicholas Lose   AGENT/BLOOD PRODUCT RECEIVING TODAY: Paclitaxel and trastuzumab  AGENT/BLOOD PRODUCT RECEIVING IMMEDIATELY PRIOR TO REACTION: Paclitaxel   VS: BP:      148/86   P:        78       SPO2:        97% on room air                  REACTION(S):            Hot flashes and facial erythema   PREMEDS:      Tylenol 650 mg p.o. x1, Benadryl 25 mg, Pepcid 20 mg IV, and Zofran   INTERVENTION: Pepcid 20 mg IV x1 and Benadryl 25 mg IV x1   Review of Systems  Review of Systems  Constitutional: Negative for chills, diaphoresis and fever.  HENT: Negative for trouble swallowing and voice change.        Facial warmth and erythema  Respiratory: Negative for cough, chest tightness, shortness of breath and wheezing.   Cardiovascular: Negative for chest pain and palpitations.  Gastrointestinal: Negative for abdominal pain, constipation, diarrhea, nausea and vomiting.  Musculoskeletal: Negative for back pain and myalgias.  Neurological: Negative for dizziness, light-headedness and headaches.     Physical Exam  Physical Exam Constitutional:      General: She is not in acute distress.    Appearance: She is not diaphoretic.  HENT:     Head: Normocephalic and atraumatic.     Comments: Mild facial erythema Cardiovascular:     Rate and Rhythm: Normal rate and regular rhythm.     Heart sounds: Normal heart sounds. No murmur. No friction rub. No gallop.   Pulmonary:     Effort: Pulmonary effort is normal. No respiratory distress.     Breath sounds: Normal breath sounds. No wheezing or rales.  Skin:    General: Skin is warm and dry.     Findings: No erythema or rash.  Neurological:     Mental Status: She is alert.     OUTCOME:                 Paclitaxel was paused.  The patient was given Pepcid 20 mg IV x1 and Benadryl 25 mg IV x1. The patient's  symptoms abated.  She was able to complete the remainder of her infusion without any additional issues or concerns.   Sandi Mealy, MHS, PA-C

## 2018-11-15 NOTE — Patient Instructions (Signed)
[facility] Discharge Instructions for Patients Receiving Chemotherapy  Today you received the following chemotherapy agents:  Herceptin and Taxol.  To help prevent nausea and vomiting after your treatment, we encourage you to take your nausea medication as directed.   If you develop nausea and vomiting that is not controlled by your nausea medication, call the clinic.   BELOW ARE SYMPTOMS THAT SHOULD BE REPORTED IMMEDIATELY:  *FEVER GREATER THAN 100.5 F  *CHILLS WITH OR WITHOUT FEVER  NAUSEA AND VOMITING THAT IS NOT CONTROLLED WITH YOUR NAUSEA MEDICATION  *UNUSUAL SHORTNESS OF BREATH  *UNUSUAL BRUISING OR BLEEDING  TENDERNESS IN MOUTH AND THROAT WITH OR WITHOUT PRESENCE OF ULCERS  *URINARY PROBLEMS  *BOWEL PROBLEMS  UNUSUAL RASH Items with * indicate a potential emergency and should be followed up as soon as possible.  Feel free to call the clinic should you have any questions or concerns. The clinic phone number is [phone].  Please show the [facility] at check-in to the Emergency Department and triage nurse.  Trastuzumab injection for infusion What is this medicine? TRASTUZUMAB (tras TOO zoo mab) is a monoclonal antibody. It is used to treat breast cancer and stomach cancer. This medicine may be used for other purposes; ask your health care provider or pharmacist if you have questions. COMMON BRAND NAME(S): Herceptin, Calla Kicks, OGIVRI What should I tell my health care provider before I take this medicine? They need to know if you have any of these conditions: -heart disease -heart failure -lung or breathing disease, like asthma -an unusual or allergic reaction to trastuzumab, benzyl alcohol, or other medications, foods, dyes, or preservatives -pregnant or trying to get pregnant -breast-feeding How should I use this medicine? This drug is given as an infusion into a vein. It is administered in a hospital or clinic by a specially trained  health care professional. Talk to your pediatrician regarding the use of this medicine in children. This medicine is not approved for use in children. Overdosage: If you think you have taken too much of this medicine contact a poison control center or emergency room at once. NOTE: This medicine is only for you. Do not share this medicine with others. What if I miss a dose? It is important not to miss a dose. Call your doctor or health care professional if you are unable to keep an appointment. What may interact with this medicine? This medicine may interact with the following medications: -certain types of chemotherapy, such as daunorubicin, doxorubicin, epirubicin, and idarubicin This list may not describe all possible interactions. Give your health care provider a list of all the medicines, herbs, non-prescription drugs, or dietary supplements you use. Also tell them if you smoke, drink alcohol, or use illegal drugs. Some items may interact with your medicine. What should I watch for while using this medicine? Visit your doctor for checks on your progress. Report any side effects. Continue your course of treatment even though you feel ill unless your doctor tells you to stop. Call your doctor or health care professional for advice if you get a fever, chills or sore throat, or other symptoms of a cold or flu. Do not treat yourself. Try to avoid being around people who are sick. You may experience fever, chills and shaking during your first infusion. These effects are usually mild and can be treated with other medicines. Report any side effects during the infusion to your health care professional. Fever and chills usually do not happen with later infusions.  Do not become pregnant while taking this medicine or for 7 months after stopping it. Women should inform their doctor if they wish to become pregnant or think they might be pregnant. Women of child-bearing potential will need to have a negative  pregnancy test before starting this medicine. There is a potential for serious side effects to an unborn child. Talk to your health care professional or pharmacist for more information. Do not breast-feed an infant while taking this medicine or for 7 months after stopping it. Women must use effective birth control with this medicine. What side effects may I notice from receiving this medicine? Side effects that you should report to your doctor or health care professional as soon as possible: -allergic reactions like skin rash, itching or hives, swelling of the face, lips, or tongue -chest pain or palpitations -cough -dizziness -feeling faint or lightheaded, falls -fever -general ill feeling or flu-like symptoms -signs of worsening heart failure like breathing problems; swelling in your legs and feet -unusually weak or tired Side effects that usually do not require medical attention (report to your doctor or health care professional if they continue or are bothersome): -bone pain -changes in taste -diarrhea -joint pain -nausea/vomiting -weight loss This list may not describe all possible side effects. Call your doctor for medical advice about side effects. You may report side effects to FDA at 1-800-FDA-1088. Where should I keep my medicine? This drug is given in a hospital or clinic and will not be stored at home. NOTE: This sheet is a summary. It may not cover all possible information. If you have questions about this medicine, talk to your doctor, pharmacist, or health care provider.  2019 Elsevier/Gold Standard (2016-09-20 14:37:52)  Paclitaxel injection What is this medicine? PACLITAXEL (PAK li TAX el) is a chemotherapy drug. It targets fast dividing cells, like cancer cells, and causes these cells to die. This medicine is used to treat ovarian cancer, breast cancer, lung cancer, Kaposi's sarcoma, and other cancers. This medicine may be used for other purposes; ask your health care  provider or pharmacist if you have questions. COMMON BRAND NAME(S): Onxol, Taxol What should I tell my health care provider before I take this medicine? They need to know if you have any of these conditions: -history of irregular heartbeat -liver disease -low blood counts, like low white cell, platelet, or red cell counts -lung or breathing disease, like asthma -tingling of the fingers or toes, or other nerve disorder -an unusual or allergic reaction to paclitaxel, alcohol, polyoxyethylated castor oil, other chemotherapy, other medicines, foods, dyes, or preservatives -pregnant or trying to get pregnant -breast-feeding How should I use this medicine? This drug is given as an infusion into a vein. It is administered in a hospital or clinic by a specially trained health care professional. Talk to your pediatrician regarding the use of this medicine in children. Special care may be needed. Overdosage: If you think you have taken too much of this medicine contact a poison control center or emergency room at once. NOTE: This medicine is only for you. Do not share this medicine with others. What if I miss a dose? It is important not to miss your dose. Call your doctor or health care professional if you are unable to keep an appointment. What may interact with this medicine? Do not take this medicine with any of the following medications: -disulfiram -metronidazole This medicine may also interact with the following medications: -antiviral medicines for hepatitis, HIV or AIDS -certain antibiotics like  erythromycin and clarithromycin -certain medicines for fungal infections like ketoconazole and itraconazole -certain medicines for seizures like carbamazepine, phenobarbital, phenytoin -gemfibrozil -nefazodone -rifampin -St. John's wort This list may not describe all possible interactions. Give your health care provider a list of all the medicines, herbs, non-prescription drugs, or dietary  supplements you use. Also tell them if you smoke, drink alcohol, or use illegal drugs. Some items may interact with your medicine. What should I watch for while using this medicine? Your condition will be monitored carefully while you are receiving this medicine. You will need important blood work done while you are taking this medicine. This medicine can cause serious allergic reactions. To reduce your risk you will need to take other medicine(s) before treatment with this medicine. If you experience allergic reactions like skin rash, itching or hives, swelling of the face, lips, or tongue, tell your doctor or health care professional right away. In some cases, you may be given additional medicines to help with side effects. Follow all directions for their use. This drug may make you feel generally unwell. This is not uncommon, as chemotherapy can affect healthy cells as well as cancer cells. Report any side effects. Continue your course of treatment even though you feel ill unless your doctor tells you to stop. Call your doctor or health care professional for advice if you get a fever, chills or sore throat, or other symptoms of a cold or flu. Do not treat yourself. This drug decreases your body's ability to fight infections. Try to avoid being around people who are sick. This medicine may increase your risk to bruise or bleed. Call your doctor or health care professional if you notice any unusual bleeding. Be careful brushing and flossing your teeth or using a toothpick because you may get an infection or bleed more easily. If you have any dental work done, tell your dentist you are receiving this medicine. Avoid taking products that contain aspirin, acetaminophen, ibuprofen, naproxen, or ketoprofen unless instructed by your doctor. These medicines may hide a fever. Do not become pregnant while taking this medicine. Women should inform their doctor if they wish to become pregnant or think they might be  pregnant. There is a potential for serious side effects to an unborn child. Talk to your health care professional or pharmacist for more information. Do not breast-feed an infant while taking this medicine. Men are advised not to father a child while receiving this medicine. This product may contain alcohol. Ask your pharmacist or healthcare provider if this medicine contains alcohol. Be sure to tell all healthcare providers you are taking this medicine. Certain medicines, like metronidazole and disulfiram, can cause an unpleasant reaction when taken with alcohol. The reaction includes flushing, headache, nausea, vomiting, sweating, and increased thirst. The reaction can last from 30 minutes to several hours. What side effects may I notice from receiving this medicine? Side effects that you should report to your doctor or health care professional as soon as possible: -allergic reactions like skin rash, itching or hives, swelling of the face, lips, or tongue -breathing problems -changes in vision -fast, irregular heartbeat -high or low blood pressure -mouth sores -pain, tingling, numbness in the hands or feet -signs of decreased platelets or bleeding - bruising, pinpoint red spots on the skin, black, tarry stools, blood in the urine -signs of decreased red blood cells - unusually weak or tired, feeling faint or lightheaded, falls -signs of infection - fever or chills, cough, sore throat, pain or difficulty passing  urine -signs and symptoms of liver injury like dark yellow or brown urine; general ill feeling or flu-like symptoms; light-colored stools; loss of appetite; nausea; right upper belly pain; unusually weak or tired; yellowing of the eyes or skin -swelling of the ankles, feet, hands -unusually slow heartbeat Side effects that usually do not require medical attention (report to your doctor or health care professional if they continue or are bothersome): -diarrhea -hair loss -loss of  appetite -muscle or joint pain -nausea, vomiting -pain, redness, or irritation at site where injected -tiredness This list may not describe all possible side effects. Call your doctor for medical advice about side effects. You may report side effects to FDA at 1-800-FDA-1088. Where should I keep my medicine? This drug is given in a hospital or clinic and will not be stored at home. NOTE: This sheet is a summary. It may not cover all possible information. If you have questions about this medicine, talk to your doctor, pharmacist, or health care provider.  2019 Elsevier/Gold Standard (2017-05-30 13:14:55)

## 2018-11-15 NOTE — Progress Notes (Signed)
@   1510 during Taxol infusion pt flushed in face, neck, and chest. Pt reported feeling hot. Pt reports no trouble breathing and no itching in mouth, throat or tongue, and no other s/s.  Hypersensitivity protocol started, Sandi Mealy PA to infusion room. Pt returned to baseline quickly, flushing and "hotness" resolved quickly afterTaxol was stopped.  NS hung to gravity. Pepcid and  benadryl given. VS as charted in flowsheets, medications as charted in [facility].   Per Sandi Mealy PA ok to rechallenge pt Taxol @1530 .   Report given and care transferred  to [place] at 1600.

## 2018-11-15 NOTE — Progress Notes (Signed)
11/15/18  No additional premedications at this time for Taxol reaction with cycle #1 per Dr Lindi Adie.  Infuse today's Taxol at a slower rate of 1.5 hours and monitor for any side effects.  T.O. Dr Jannifer Hick, PharmD

## 2018-11-15 NOTE — Assessment & Plan Note (Signed)
Malignant neoplasm of upper-inner quadrant of right breast in female, estrogen receptor positive ([place]) 09/27/19:Two right breast masses 12:00 to 1 o'clock position 1.3 cm and 1.2 cm, they are 1.5 cm apart. 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 Neoadjuvant chemotherapy withTaxol Herceptin weekly x73fllowed by Herceptin maintenance vsKadcylafor 1 year 2. Followed by breast conserving surgery if possible with sentinel lymph node study 3. Followed by adjuvant radiation therapy if patient had lumpectomy 4.Followed by antiestrogen therapy ------------------------------------------------------------------------------------------------------------------------------------------------------------ Current treatment: Cycle 2 Taxol Herceptin Echocardiogram 10/29/2018: EF 60 to 65%  Chemo toxicities:  Facial warmth during infusions: Given Benadryl Return to clinic weekly for chemotherapy other week for follow-up with me

## 2018-11-16 ENCOUNTER — Encounter (HOSPITAL_BASED_OUTPATIENT_CLINIC_OR_DEPARTMENT_OTHER): Payer: Self-pay | Admitting: General Surgery

## 2018-11-16 ENCOUNTER — Telehealth: Payer: Self-pay | Admitting: Hematology and Oncology

## 2018-11-16 NOTE — Telephone Encounter (Signed)
No los °

## 2018-11-22 ENCOUNTER — Inpatient Hospital Stay: Payer: BLUE CROSS/BLUE SHIELD

## 2018-11-22 ENCOUNTER — Ambulatory Visit (HOSPITAL_BASED_OUTPATIENT_CLINIC_OR_DEPARTMENT_OTHER): Payer: BLUE CROSS/BLUE SHIELD | Admitting: Medical

## 2018-11-22 VITALS — BP 136/78 | HR 74 | Temp 97.7°F | Resp 17

## 2018-11-22 DIAGNOSIS — C50211 Malignant neoplasm of upper-inner quadrant of right female breast: Secondary | ICD-10-CM

## 2018-11-22 DIAGNOSIS — Z5111 Encounter for antineoplastic chemotherapy: Secondary | ICD-10-CM | POA: Diagnosis not present

## 2018-11-22 DIAGNOSIS — Z95828 Presence of other vascular implants and grafts: Secondary | ICD-10-CM

## 2018-11-22 DIAGNOSIS — T8090XA Unspecified complication following infusion and therapeutic injection, initial encounter: Secondary | ICD-10-CM | POA: Diagnosis not present

## 2018-11-22 DIAGNOSIS — Z170 Estrogen receptor positive status [ER+]: Secondary | ICD-10-CM | POA: Diagnosis not present

## 2018-11-22 DIAGNOSIS — Z5112 Encounter for antineoplastic immunotherapy: Secondary | ICD-10-CM | POA: Diagnosis not present

## 2018-11-22 LAB — CMP (CANCER CENTER ONLY)
ALT: 21 U/L (ref 0–44)
ANION GAP: 8 (ref 5–15)
AST: 19 U/L (ref 15–41)
Albumin: 3.8 g/dL (ref 3.5–5.0)
Alkaline Phosphatase: 108 U/L (ref 38–126)
BUN: 17 mg/dL (ref 8–23)
CALCIUM: 8.9 mg/dL (ref 8.9–10.3)
CO2: 25 mmol/L (ref 22–32)
Chloride: 107 mmol/L (ref 98–111)
Creatinine: 0.78 mg/dL (ref 0.44–1.00)
GFR, Est AFR Am: >60 mL/min (ref >60)
GFR, Estimated: >60 mL/min (ref >60)
Glucose, Bld: 95 mg/dL (ref 70–99)
Potassium: 4.2 mmol/L (ref 3.5–5.1)
Sodium: 140 mmol/L (ref 135–145)
Total Bilirubin: 0.8 mg/dL (ref 0.3–1.2)
Total Protein: 6.9 g/dL (ref 6.5–8.1)

## 2018-11-22 LAB — CBC WITH DIFFERENTIAL (CANCER CENTER ONLY)
Abs Immature Granulocytes: 0.01 10*3/uL (ref 0.00–0.07)
Basophils Absolute: 0 10*3/uL (ref 0.0–0.1)
Basophils Relative: 1 %
Eosinophils Absolute: 0.1 10*3/uL (ref 0.0–0.5)
Eosinophils Relative: 2 %
HCT: 35.2 % — ABNORMAL LOW (ref 36.0–46.0)
Hemoglobin: 11.2 g/dL — ABNORMAL LOW (ref 12.0–15.0)
Immature Granulocytes: 0 %
LYMPHS PCT: 40 %
Lymphs Abs: 1.4 10*3/uL (ref 0.7–4.0)
MCH: 31.2 pg (ref 26.0–34.0)
MCHC: 31.8 g/dL (ref 30.0–36.0)
MCV: 98.1 fL (ref 80.0–100.0)
Monocytes Absolute: 0.2 10*3/uL (ref 0.1–1.0)
Monocytes Relative: 6 %
Neutro Abs: 1.8 10*3/uL (ref 1.7–7.7)
Neutrophils Relative %: 51 %
Platelet Count: 298 10*3/uL (ref 150–400)
RBC: 3.59 MIL/uL — AB (ref 3.87–5.11)
RDW: 13.1 % (ref 11.5–15.5)
WBC Count: 3.6 10*3/uL — ABNORMAL LOW (ref 4.0–10.5)
nRBC: 0 % (ref 0.0–0.2)

## 2018-11-22 MED ORDER — FAMOTIDINE IN NACL 20-0.9 MG/50ML-% IV SOLN
INTRAVENOUS | Status: AC
  Filled 2018-11-22: qty 50

## 2018-11-22 MED ORDER — ACETAMINOPHEN 325 MG PO TABS
ORAL_TABLET | ORAL | Status: AC
  Filled 2018-11-22: qty 2

## 2018-11-22 MED ORDER — DIPHENHYDRAMINE HCL 50 MG/ML IJ SOLN
25.0000 mg | Freq: Once | INTRAMUSCULAR | Status: AC
  Administered 2018-11-22: 25 mg via INTRAVENOUS

## 2018-11-22 MED ORDER — SODIUM CHLORIDE 0.9 % IV SOLN
80.0000 mg/m2 | Freq: Once | INTRAVENOUS | Status: AC
  Administered 2018-11-22: 156 mg via INTRAVENOUS
  Filled 2018-11-22: qty 26

## 2018-11-22 MED ORDER — TRASTUZUMAB CHEMO 150 MG IV SOLR
2.0000 mg/kg | Freq: Once | INTRAVENOUS | Status: AC
  Administered 2018-11-22: 168 mg via INTRAVENOUS
  Filled 2018-11-22: qty 8

## 2018-11-22 MED ORDER — HEPARIN SOD (PORK) LOCK FLUSH 100 UNIT/ML IV SOLN
500.0000 [IU] | Freq: Once | INTRAVENOUS | Status: AC | PRN
  Administered 2018-11-22: 500 [IU]
  Filled 2018-11-22: qty 5

## 2018-11-22 MED ORDER — ONDANSETRON HCL 4 MG/2ML IJ SOLN
8.0000 mg | Freq: Once | INTRAMUSCULAR | Status: AC
  Administered 2018-11-22: 8 mg via INTRAVENOUS

## 2018-11-22 MED ORDER — SODIUM CHLORIDE 0.9% FLUSH
10.0000 mL | INTRAVENOUS | Status: DC | PRN
  Administered 2018-11-22: 10 mL
  Filled 2018-11-22: qty 10

## 2018-11-22 MED ORDER — SODIUM CHLORIDE 0.9 % IV SOLN
Freq: Once | INTRAVENOUS | Status: DC | PRN
  Filled 2018-11-22: qty 250

## 2018-11-22 MED ORDER — DEXAMETHASONE SODIUM PHOSPHATE 10 MG/ML IJ SOLN
INTRAMUSCULAR | Status: AC
  Filled 2018-11-22: qty 1

## 2018-11-22 MED ORDER — DIPHENHYDRAMINE HCL 50 MG/ML IJ SOLN
INTRAMUSCULAR | Status: AC
  Filled 2018-11-22: qty 1

## 2018-11-22 MED ORDER — FAMOTIDINE IN NACL 20-0.9 MG/50ML-% IV SOLN
20.0000 mg | Freq: Once | INTRAVENOUS | Status: AC | PRN
  Administered 2018-11-22: 20 mg via INTRAVENOUS

## 2018-11-22 MED ORDER — DIPHENHYDRAMINE HCL 50 MG/ML IJ SOLN
25.0000 mg | Freq: Once | INTRAMUSCULAR | Status: AC | PRN
  Administered 2018-11-22: 25 mg via INTRAVENOUS

## 2018-11-22 MED ORDER — SODIUM CHLORIDE 0.9 % IV SOLN
Freq: Once | INTRAVENOUS | Status: AC
  Administered 2018-11-22: 12:00:00 via INTRAVENOUS
  Filled 2018-11-22: qty 250

## 2018-11-22 MED ORDER — SODIUM CHLORIDE 0.9% FLUSH
10.0000 mL | Freq: Once | INTRAVENOUS | Status: AC
  Administered 2018-11-22: 10 mL
  Filled 2018-11-22: qty 10

## 2018-11-22 MED ORDER — FAMOTIDINE IN NACL 20-0.9 MG/50ML-% IV SOLN
20.0000 mg | Freq: Once | INTRAVENOUS | Status: AC
  Administered 2018-11-22: 20 mg via INTRAVENOUS

## 2018-11-22 MED ORDER — DEXAMETHASONE SODIUM PHOSPHATE 10 MG/ML IJ SOLN
10.0000 mg | Freq: Once | INTRAMUSCULAR | Status: AC
  Administered 2018-11-22: 10 mg via INTRAVENOUS

## 2018-11-22 MED ORDER — ACETAMINOPHEN 325 MG PO TABS
650.0000 mg | ORAL_TABLET | Freq: Once | ORAL | Status: AC
  Administered 2018-11-22: 650 mg via ORAL

## 2018-11-22 MED ORDER — ONDANSETRON HCL 4 MG/2ML IJ SOLN
INTRAMUSCULAR | Status: AC
  Filled 2018-11-22: qty 4

## 2018-11-22 NOTE — Progress Notes (Signed)
Spoke w/ Mendel Ryder, NP since Dr. Lindi Adie is out of office today. Pt had some flushing and felt hot during C2 paclitaxel when being infused over 1.5 hours per notes. Plan today is to run infusion over 2 hours.   Demetrius Charity, PharmD, [place] [facility] [facility] Pharmacist Pharmacy Phone: [phone] 11/22/2018

## 2018-11-22 NOTE — Patient Instructions (Signed)
[facility] Discharge Instructions for Patients Receiving Chemotherapy  Today you received the following chemotherapy agents:  Taxol & Herceptin  To help prevent nausea and vomiting after your treatment, we encourage you to take your nausea medication as needed.    If you develop nausea and vomiting that is not controlled by your nausea medication, call the clinic.   BELOW ARE SYMPTOMS THAT SHOULD BE REPORTED IMMEDIATELY:  *FEVER GREATER THAN 100.5 F  *CHILLS WITH OR WITHOUT FEVER  NAUSEA AND VOMITING THAT IS NOT CONTROLLED WITH YOUR NAUSEA MEDICATION  *UNUSUAL SHORTNESS OF BREATH  *UNUSUAL BRUISING OR BLEEDING  TENDERNESS IN MOUTH AND THROAT WITH OR WITHOUT PRESENCE OF ULCERS  *URINARY PROBLEMS  *BOWEL PROBLEMS  UNUSUAL RASH Items with * indicate a potential emergency and should be followed up as soon as possible.  Feel free to call the clinic should you have any questions or concerns. The clinic phone number is [phone].  Please show the [facility] at check-in to the Emergency Department and triage nurse.

## 2018-11-22 NOTE — Progress Notes (Signed)
Pt c/o flushing in face & feeling nauseated @ 3:30 pm not long into Taxol infusion.  BP 152/82, P 91, O2 sat 99, Temp 97.9.  Benadryl 25 mg given IV, & called Sandi Mealy PA to come over.  Pepcid 20 mg given IV per Van's request.  Flushing fading quickly & nausea subsided after benadryl.  Ok to r/s taxol at 2 hour rate per Sandi Mealy PA @ 1555 pm.  1615.  Pt tol restart of Taxol well.

## 2018-11-22 NOTE — Research (Signed)
DCP-001 Consent  This RN met with patient and her friend for approximately 15 minutes to discuss DCP-001. This RN reviewed the informed consent and hipaa form page by page with patient and reviewed the purpose, possible risks and benefits, her rights and how her information will be used. After answering questions to her satisfaction, patient signed the informed consent and hipaa form at 1311 pm. Patient then completed the DCP-001 worksheet. Patient was then provided a copy of her signed inform consent and hipaa form.  Upcoming Visits- Confirmed with patient that her 1 month lab appt was scheduled for 12/06/2018. Patient and this RN agreed for this RN to call her the day before her appointment to remind her to fast.   Thanked patient for her time and participation.  Johney Maine RN, BSN Clinical Research  11/22/2018 1:47 PM

## 2018-11-26 ENCOUNTER — Other Ambulatory Visit: Payer: Self-pay | Admitting: Hematology and Oncology

## 2018-11-26 NOTE — Progress Notes (Signed)
    DATE:  11/22/2018                                           X (liver function decline CHEMO/IMMUNOTHERAPY REACTION          MD:  Dr. Nicholas Lose   AGENT/BLOOD PRODUCT RECEIVING TODAY:               Paclitaxel   AGENT/BLOOD PRODUCT RECEIVING IMMEDIATELY PRIOR TO REACTION:           Paclitaxel   VS: BP:      152/82   P:        85       SPO2:        99% on room air                  REACTION(S):            Flushing and nausea   PREMEDS:      Tylenol 650 mg p.o. x1, Benadryl 25 mg, Decadron 10 mg, Zofran 8 mg   INTERVENTION: Benadryl 25 mg IV x1   Review of Systems  Review of Systems  Constitutional: Negative for chills, diaphoresis and fever.       Flushing  HENT: Negative for trouble swallowing and voice change.   Respiratory: Negative for cough, chest tightness, shortness of breath and wheezing.   Cardiovascular: Negative for chest pain and palpitations.  Gastrointestinal: Positive for nausea. Negative for abdominal pain, constipation, diarrhea and vomiting.  Musculoskeletal: Negative for back pain and myalgias.  Neurological: Negative for dizziness, light-headedness and headaches.     Physical Exam  Physical Exam Constitutional:      General: She is not in acute distress.    Appearance: She is not diaphoretic.  HENT:     Head: Normocephalic and atraumatic.  Cardiovascular:     Rate and Rhythm: Normal rate and regular rhythm.     Heart sounds: Normal heart sounds. No murmur. No friction rub. No gallop.   Pulmonary:     Effort: Pulmonary effort is normal. No respiratory distress.     Breath sounds: Normal breath sounds. No wheezing or rales.  Skin:    General: Skin is warm and dry.     Findings: Erythema (Cheeks are erythematous) present. No rash.  Neurological:     Mental Status: She is alert.     OUTCOME:            Taxol was paused and the patient was given Benadryl 25 mg IV.  Her symptoms had resolved after Taxol was paused.  She was able to restart her  chemotherapy after IV Benadryl was administered and completed her infusion of Taxol today without additional issues of concern.        Sandi Mealy, MHS, PA-C

## 2018-11-26 NOTE — Progress Notes (Signed)
Because of reaction to Taxol we are switching the patient to Abraxane.

## 2018-11-27 ENCOUNTER — Encounter: Payer: Self-pay | Admitting: Pharmacist

## 2018-11-27 NOTE — Progress Notes (Signed)
Patient Care Team: Chevis Pretty, FNP as PCP - General (Nurse Practitioner)  DIAGNOSIS:    ICD-10-CM   1. Malignant neoplasm of upper-inner quadrant of right breast in female, estrogen receptor positive ([place]) C50.211    Z17.0     SUMMARY OF ONCOLOGIC HISTORY:   Malignant neoplasm of upper-inner quadrant of right breast in female, estrogen receptor positive ([place])   09/26/2018 Initial Diagnosis    Two right breast masses 12:00 to 1 o'clock position 1.3 cm and 1.2 cm, they are 1.5 cm apart.  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 with above-mentioned history of right breast cancer who is currently on chemotherapy with weekly Abraxane and Herceptin. She was switched to Abraxane this week after severe facial flushing in response to Taxol at the last three treatments. She is a participant in the [employer] clinical trial. She presents to the clinic today with her daughter and husband. She had facial flushing during her treatment that resolved after the infusion finished. She denies fatigue or tingling or numbness in her hands and feet. She notes blood when she blows her nose. Her labs from today show WBC 3.9, Hg 11.3, platelets 311, ANC 2.4.   REVIEW OF SYSTEMS:   Constitutional: Denies fevers, chills or abnormal weight loss Eyes: Denies blurriness of vision Ears, nose,  mouth, throat, and face: Denies mucositis or sore throat (+) nasal bleeding Respiratory: Denies cough, dyspnea or wheezes Cardiovascular: Denies palpitation, chest discomfort Gastrointestinal: Denies nausea, heartburn or change in bowel habits Skin: (+) facial flushing, resolved Lymphatics: Denies new lymphadenopathy or easy bruising Neurological: Denies numbness, tingling or new weaknesses Behavioral/Psych: Mood is stable, no new changes  Extremities: No lower extremity edema Breast: denies any pain or lumps or nodules in either breasts All other systems were reviewed with the patient and are negative.  I have reviewed the past medical history, past surgical history, social history and family history with the patient and they are unchanged from previous note.  ALLERGIES:  is allergic to paclitaxel.  MEDICATIONS:  Current Outpatient Medications  Medication Sig Dispense Refill   HYDROcodone-acetaminophen (NORCO/VICODIN) 5-325 MG tablet Take 1-2 tablets by mouth every 6 (six) hours as needed for moderate pain or severe pain. 10 tablet 0   lidocaine-prilocaine (EMLA) cream Apply to affected area once 30 g 3   Multiple Vitamin (MULTIVITAMIN) tablet Take 1 tablet by mouth daily.      ondansetron (ZOFRAN) 8 MG tablet Take 1 tablet (8 mg total) by mouth 2 (two) times daily as needed (Nausea or vomiting). 30 tablet 1   prochlorperazine (COMPAZINE) 10 MG tablet Take 1 tablet (10 mg total) by mouth every 6 (six) hours as needed (Nausea or vomiting). 30 tablet 1   No current facility-administered medications for this visit.     PHYSICAL EXAMINATION:  ECOG PERFORMANCE STATUS: 1 - Symptomatic but completely ambulatory  Vitals:   11/29/18 0836  BP: (!) 143/79  Pulse: 80  Resp: 17  Temp: 98.4 F (36.9 C)  SpO2: 100%   Filed Weights   11/29/18 0836  Weight: 183 lb 6.4 oz (83.2 kg)    GENERAL: alert, no distress and comfortable SKIN: skin color, texture, turgor are normal, no rashes  or significant lesions EYES: normal, Conjunctiva are pink and non-injected, sclera clear OROPHARYNX: no exudate, no erythema and lips, buccal mucosa, and tongue normal  NECK: supple, thyroid normal size, non-tender, without nodularity LYMPH: no palpable lymphadenopathy in the cervical, axillary or inguinal LUNGS: clear to auscultation and percussion with normal breathing effort HEART: regular rate & rhythm and no murmurs and no lower extremity edema ABDOMEN: abdomen soft, non-tender and normal bowel sounds MUSCULOSKELETAL: no cyanosis of digits and no clubbing  NEURO: alert & oriented x 3 with fluent speech, no focal motor/sensory deficits EXTREMITIES: No lower extremity edema  LABORATORY DATA:  I have reviewed the data as listed CMP Latest Ref Rng & Units 11/22/2018 11/15/2018 11/08/2018  Glucose 70 - 99 mg/dL 95 103(H) 91  BUN 8 - 23 mg/dL '17 14 20  '$ Creatinine 0.44 - 1.00 mg/dL 0.78 0.76 0.75  Sodium 135 - 145 mmol/L 140 141 141  Potassium 3.5 - 5.1 mmol/L 4.2 4.4 4.2  Chloride 98 - 111 mmol/L 107 105 106  CO2 22 - 32 mmol/L '25 29 25  '$ Calcium 8.9 - 10.3 mg/dL 8.9 9.5 9.4  Total Protein 6.5 - 8.1 g/dL 6.9 6.6 7.2  Total Bilirubin 0.3 - 1.2 mg/dL 0.8 1.0 0.4  Alkaline Phos 38 - 126 U/L 108 105 102  AST 15 - 41 U/L 19 12(L) 11(L)  ALT 0 - 44 U/L '21 16 9    '$ Lab Results  Component Value Date   WBC 3.9 (L) 11/29/2018   HGB 11.3 (L) 11/29/2018   HCT 35.0 (L) 11/29/2018   MCV 97.0 11/29/2018   PLT 311 11/29/2018   NEUTROABS 2.4 11/29/2018    ASSESSMENT & PLAN:  Malignant neoplasm of upper-inner quadrant of right breast in female, estrogen receptor positive ([place]) 09/27/19:Two right breast masses 12:00 to 1 o'clock position 1.3 cm and 1.2 cm, they are 1.5 cm apart. 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 Neoadjuvant chemotherapy withTaxol Herceptin weekly x65fllowed by Herceptin maintenance vsKadcylafor 1 year 2. Followed by breast conserving surgery if possible with sentinel lymph node study 3. Followed by adjuvant radiation therapy if patient had lumpectomy 4.Followed by antiestrogen therapy ------------------------------------------------------------------------------------------------------------------------------------------------------------ Current treatment: Cycle 4 Abraxane Herceptin Echocardiogram 10/29/2018: EF 60 to 65%  Chemo toxicities: Facial warmth/reaction to Taxol during infusions: switched to Abraxane Nausea  Denies neuropathy. Return to clinic weekly for chemotherapy other week for follow-up with me    No orders of the defined types were placed in this encounter.  The patient has a good understanding of the overall plan. she agrees with it. she will call with any problems that may develop before the next visit here.  GNicholas Lose MD 11/29/2018  IJulious OkaDorshimer am acting as scribe for Dr. VNicholas Lose  I have reviewed the above documentation for accuracy and completeness, and I agree with the above.

## 2018-11-29 ENCOUNTER — Telehealth: Payer: Self-pay | Admitting: Hematology and Oncology

## 2018-11-29 ENCOUNTER — Inpatient Hospital Stay: Payer: BLUE CROSS/BLUE SHIELD

## 2018-11-29 ENCOUNTER — Inpatient Hospital Stay (HOSPITAL_BASED_OUTPATIENT_CLINIC_OR_DEPARTMENT_OTHER): Payer: BLUE CROSS/BLUE SHIELD | Admitting: Hematology and Oncology

## 2018-11-29 DIAGNOSIS — C50211 Malignant neoplasm of upper-inner quadrant of right female breast: Secondary | ICD-10-CM

## 2018-11-29 DIAGNOSIS — Z5112 Encounter for antineoplastic immunotherapy: Secondary | ICD-10-CM | POA: Diagnosis not present

## 2018-11-29 DIAGNOSIS — Z95828 Presence of other vascular implants and grafts: Secondary | ICD-10-CM

## 2018-11-29 DIAGNOSIS — Z170 Estrogen receptor positive status [ER+]: Secondary | ICD-10-CM | POA: Diagnosis not present

## 2018-11-29 DIAGNOSIS — Z5111 Encounter for antineoplastic chemotherapy: Secondary | ICD-10-CM | POA: Diagnosis not present

## 2018-11-29 LAB — CBC WITH DIFFERENTIAL (CANCER CENTER ONLY)
Abs Immature Granulocytes: 0.02 10*3/uL (ref 0.00–0.07)
Basophils Absolute: 0 10*3/uL (ref 0.0–0.1)
Basophils Relative: 1 %
Eosinophils Absolute: 0.1 10*3/uL (ref 0.0–0.5)
Eosinophils Relative: 2 %
HCT: 35 % — ABNORMAL LOW (ref 36.0–46.0)
Hemoglobin: 11.3 g/dL — ABNORMAL LOW (ref 12.0–15.0)
Immature Granulocytes: 1 %
LYMPHS ABS: 1.1 10*3/uL (ref 0.7–4.0)
Lymphocytes Relative: 29 %
MCH: 31.3 pg (ref 26.0–34.0)
MCHC: 32.3 g/dL (ref 30.0–36.0)
MCV: 97 fL (ref 80.0–100.0)
Monocytes Absolute: 0.2 10*3/uL (ref 0.1–1.0)
Monocytes Relative: 6 %
Neutro Abs: 2.4 10*3/uL (ref 1.7–7.7)
Neutrophils Relative %: 61 %
Platelet Count: 311 10*3/uL (ref 150–400)
RBC: 3.61 MIL/uL — ABNORMAL LOW (ref 3.87–5.11)
RDW: 13.2 % (ref 11.5–15.5)
WBC Count: 3.9 10*3/uL — ABNORMAL LOW (ref 4.0–10.5)
nRBC: 0 % (ref 0.0–0.2)

## 2018-11-29 LAB — CMP (CANCER CENTER ONLY)
ALT: 19 U/L (ref 0–44)
AST: 16 U/L (ref 15–41)
Albumin: 3.8 g/dL (ref 3.5–5.0)
Alkaline Phosphatase: 94 U/L (ref 38–126)
Anion gap: 9 (ref 5–15)
BUN: 19 mg/dL (ref 8–23)
CO2: 25 mmol/L (ref 22–32)
Calcium: 9.4 mg/dL (ref 8.9–10.3)
Chloride: 107 mmol/L (ref 98–111)
Creatinine: 0.89 mg/dL (ref 0.44–1.00)
GFR, Est AFR Am: >60 mL/min (ref >60)
GFR, Estimated: >60 mL/min (ref >60)
Glucose, Bld: 93 mg/dL (ref 70–99)
Potassium: 4.4 mmol/L (ref 3.5–5.1)
Sodium: 141 mmol/L (ref 135–145)
Total Bilirubin: 0.6 mg/dL (ref 0.3–1.2)
Total Protein: 6.7 g/dL (ref 6.5–8.1)

## 2018-11-29 MED ORDER — SODIUM CHLORIDE 0.9% FLUSH
10.0000 mL | Freq: Once | INTRAVENOUS | Status: AC
  Administered 2018-11-29: 10 mL
  Filled 2018-11-29: qty 10

## 2018-11-29 MED ORDER — SODIUM CHLORIDE 0.9 % IV SOLN
Freq: Once | INTRAVENOUS | Status: AC
  Administered 2018-11-29: 10:00:00 via INTRAVENOUS
  Filled 2018-11-29: qty 250

## 2018-11-29 MED ORDER — PACLITAXEL PROTEIN-BOUND CHEMO INJECTION 100 MG
80.0000 mg/m2 | Freq: Once | INTRAVENOUS | Status: AC
  Administered 2018-11-29: 150 mg via INTRAVENOUS
  Filled 2018-11-29: qty 30

## 2018-11-29 MED ORDER — TRASTUZUMAB CHEMO 150 MG IV SOLR
2.0000 mg/kg | Freq: Once | INTRAVENOUS | Status: AC
  Administered 2018-11-29: 168 mg via INTRAVENOUS
  Filled 2018-11-29: qty 8

## 2018-11-29 MED ORDER — DIPHENHYDRAMINE HCL 50 MG/ML IJ SOLN
25.0000 mg | Freq: Once | INTRAMUSCULAR | Status: AC
  Administered 2018-11-29: 25 mg via INTRAVENOUS

## 2018-11-29 MED ORDER — DIPHENHYDRAMINE HCL 50 MG/ML IJ SOLN
INTRAMUSCULAR | Status: AC
  Filled 2018-11-29: qty 1

## 2018-11-29 MED ORDER — SODIUM CHLORIDE 0.9% FLUSH
10.0000 mL | INTRAVENOUS | Status: DC | PRN
  Administered 2018-11-29: 10 mL
  Filled 2018-11-29: qty 10

## 2018-11-29 MED ORDER — ONDANSETRON HCL 4 MG/2ML IJ SOLN
8.0000 mg | Freq: Once | INTRAMUSCULAR | Status: AC
  Administered 2018-11-29: 8 mg via INTRAVENOUS

## 2018-11-29 MED ORDER — HEPARIN SOD (PORK) LOCK FLUSH 100 UNIT/ML IV SOLN
500.0000 [IU] | Freq: Once | INTRAVENOUS | Status: AC | PRN
  Administered 2018-11-29: 500 [IU]
  Filled 2018-11-29: qty 5

## 2018-11-29 MED ORDER — ONDANSETRON HCL 4 MG/2ML IJ SOLN
INTRAMUSCULAR | Status: AC
  Filled 2018-11-29: qty 4

## 2018-11-29 NOTE — Progress Notes (Signed)
Spoke w/ Otho Perl on financial team. [name] approved for Abraxane and Herceptin starting 11/29/18 for 9 treatments.   Demetrius Charity, PharmD, [facility] Pharmacist Pharmacy Phone: [phone] 11/29/2018

## 2018-11-29 NOTE — Patient Instructions (Signed)
[facility] Discharge Instructions for Patients Receiving Chemotherapy  Today you received the following chemotherapy agents: Herceptin and Abraxane  To help prevent nausea and vomiting after your treatment, we encourage you to take your nausea medication as needed.    If you develop nausea and vomiting that is not controlled by your nausea medication, call the clinic.   BELOW ARE SYMPTOMS THAT SHOULD BE REPORTED IMMEDIATELY:  *FEVER GREATER THAN 100.5 F  *CHILLS WITH OR WITHOUT FEVER  NAUSEA AND VOMITING THAT IS NOT CONTROLLED WITH YOUR NAUSEA MEDICATION  *UNUSUAL SHORTNESS OF BREATH  *UNUSUAL BRUISING OR BLEEDING  TENDERNESS IN MOUTH AND THROAT WITH OR WITHOUT PRESENCE OF ULCERS  *URINARY PROBLEMS  *BOWEL PROBLEMS  UNUSUAL RASH Items with * indicate a potential emergency and should be followed up as soon as possible.  Feel free to call the clinic should you have any questions or concerns. The clinic phone number is [phone].  Please show the [facility] at check-in to the Emergency Department and triage nurse.  Nanoparticle Albumin-Bound Paclitaxel injection What is this medicine? NANOPARTICLE ALBUMIN-BOUND PACLITAXEL (Na no PAHR ti kuhl al BYOO muhn-bound PAK li TAX el) is a chemotherapy drug. It targets fast dividing cells, like cancer cells, and causes these cells to die. This medicine is used to treat advanced breast cancer, lung cancer, and pancreatic cancer. This medicine may be used for other purposes; ask your health care provider or pharmacist if you have questions. COMMON BRAND NAME(S): Abraxane What should I tell my health care provider before I take this medicine? They need to know if you have any of these conditions: -kidney disease -liver disease -low blood counts, like low white cell, platelet, or red cell counts -lung or breathing disease, like asthma -tingling of the fingers or toes, or other nerve disorder -an unusual or  allergic reaction to paclitaxel, albumin, other chemotherapy, other medicines, foods, dyes, or preservatives -pregnant or trying to get pregnant -breast-feeding How should I use this medicine? This drug is given as an infusion into a vein. It is administered in a hospital or clinic by a specially trained health care professional. Talk to your pediatrician regarding the use of this medicine in children. Special care may be needed. Overdosage: If you think you have taken too much of this medicine contact a poison control center or emergency room at once. NOTE: This medicine is only for you. Do not share this medicine with others. What if I miss a dose? It is important not to miss your dose. Call your doctor or health care professional if you are unable to keep an appointment. What may interact with this medicine? This medicine may interact with the following medications: -antiviral medicines for hepatitis, HIV or AIDS -certain antibiotics like erythromycin and clarithromycin -certain medicines for fungal infections like ketoconazole and itraconazole -certain medicines for seizures like carbamazepine, phenobarbital, phenytoin -gemfibrozil -nefazodone -rifampin -St. John's wort This list may not describe all possible interactions. Give your health care provider a list of all the medicines, herbs, non-prescription drugs, or dietary supplements you use. Also tell them if you smoke, drink alcohol, or use illegal drugs. Some items may interact with your medicine. What should I watch for while using this medicine? Your condition will be monitored carefully while you are receiving this medicine. You will need important blood work done while you are taking this medicine. This medicine can cause serious allergic reactions. If you experience allergic reactions like skin rash, itching or hives, swelling  of the face, lips, or tongue, tell your doctor or health care professional right away. In some cases,  you may be given additional medicines to help with side effects. Follow all directions for their use. This drug may make you feel generally unwell. This is not uncommon, as chemotherapy can affect healthy cells as well as cancer cells. Report any side effects. Continue your course of treatment even though you feel ill unless your doctor tells you to stop. Call your doctor or health care professional for advice if you get a fever, chills or sore throat, or other symptoms of a cold or flu. Do not treat yourself. This drug decreases your body's ability to fight infections. Try to avoid being around people who are sick. This medicine may increase your risk to bruise or bleed. Call your doctor or health care professional if you notice any unusual bleeding. Be careful brushing and flossing your teeth or using a toothpick because you may get an infection or bleed more easily. If you have any dental work done, tell your dentist you are receiving this medicine. Avoid taking products that contain aspirin, acetaminophen, ibuprofen, naproxen, or ketoprofen unless instructed by your doctor. These medicines may hide a fever. Do not become pregnant while taking this medicine or for 6 months after stopping it. Women should inform their doctor if they wish to become pregnant or think they might be pregnant. Men should not father a child while taking this medicine or for 3 months after stopping it. There is a potential for serious side effects to an unborn child. Talk to your health care professional or pharmacist for more information. Do not breast-feed an infant while taking this medicine or for 2 weeks after stopping it. This medicine may interfere with the ability to get pregnant or to father a child. You should talk to your doctor or health care professional if you are concerned about your fertility. What side effects may I notice from receiving this medicine? Side effects that you should report to your doctor or  health care professional as soon as possible: -allergic reactions like skin rash, itching or hives, swelling of the face, lips, or tongue -breathing problems -changes in vision -fast, irregular heartbeat -low blood pressure -mouth sores -pain, tingling, numbness in the hands or feet -signs of decreased platelets or bleeding - bruising, pinpoint red spots on the skin, black, tarry stools, blood in the urine -signs of decreased red blood cells - unusually weak or tired, feeling faint or lightheaded, falls -signs of infection - fever or chills, cough, sore throat, pain or difficulty passing urine -signs and symptoms of liver injury like dark yellow or brown urine; general ill feeling or flu-like symptoms; light-colored stools; loss of appetite; nausea; right upper belly pain; unusually weak or tired; yellowing of the eyes or skin -swelling of the ankles, feet, hands -unusually slow heartbeat Side effects that usually do not require medical attention (report to your doctor or health care professional if they continue or are bothersome): -diarrhea -hair loss -loss of appetite -nausea, vomiting -tiredness This list may not describe all possible side effects. Call your doctor for medical advice about side effects. You may report side effects to FDA at 1-800-FDA-1088. Where should I keep my medicine? This drug is given in a hospital or clinic and will not be stored at home. NOTE: This sheet is a summary. It may not cover all possible information. If you have questions about this medicine, talk to your doctor, pharmacist, or health  care provider.  2019 Elsevier/Gold Standard (2017-05-30 13:03:45)

## 2018-11-29 NOTE — Assessment & Plan Note (Signed)
09/27/19:Two right breast masses 12:00 to 1 o'clock position 1.3 cm and 1.2 cm, they are 1.5 cm apart. 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 Neoadjuvant chemotherapy withTaxol Herceptin weekly x25fllowed by Herceptin maintenance vsKadcylafor 1 year 2. Followed by breast conserving surgery if possible with sentinel lymph node study 3. Followed by adjuvant radiation therapy if patient had lumpectomy 4.Followed by antiestrogen therapy ------------------------------------------------------------------------------------------------------------------------------------------------------------ Current treatment: Cycle 4 Abraxane Herceptin Echocardiogram 10/29/2018: EF 60 to 65%  Chemo toxicities: Facial warmth during infusions: switched to Abraxane Nausea Return to clinic weekly for chemotherapy other week for follow-up with me

## 2018-11-29 NOTE — Progress Notes (Signed)
Spoke w/ Otho Perl on financial team. [name] approved for Abraxane and Herceptin starting 11/29/18 for 9 treatments.   Demetrius Charity, PharmD, [facility] Pharmacist Pharmacy Phone: [phone] 11/29/2018

## 2018-11-29 NOTE — Telephone Encounter (Signed)
No los °

## 2018-12-05 ENCOUNTER — Telehealth: Payer: Self-pay

## 2018-12-05 NOTE — Telephone Encounter (Signed)
Reminded pt to fast for 3 hours prior to her lab appt. Tomorrow 12/06/18 at 1045am. Pt confirmed her understanding. Thanked pt for her time.  Johney Maine RN, BSN Clinical Research  12/05/18 4:30 PM

## 2018-12-06 ENCOUNTER — Inpatient Hospital Stay: Payer: BLUE CROSS/BLUE SHIELD

## 2018-12-06 VITALS — BP 132/75 | HR 78 | Temp 98.2°F | Resp 19 | Wt 184.0 lb

## 2018-12-06 DIAGNOSIS — Z5111 Encounter for antineoplastic chemotherapy: Secondary | ICD-10-CM | POA: Diagnosis not present

## 2018-12-06 DIAGNOSIS — C50211 Malignant neoplasm of upper-inner quadrant of right female breast: Secondary | ICD-10-CM | POA: Diagnosis not present

## 2018-12-06 DIAGNOSIS — Z170 Estrogen receptor positive status [ER+]: Secondary | ICD-10-CM | POA: Diagnosis not present

## 2018-12-06 DIAGNOSIS — Z5112 Encounter for antineoplastic immunotherapy: Secondary | ICD-10-CM | POA: Diagnosis not present

## 2018-12-06 LAB — CBC WITH DIFFERENTIAL (CANCER CENTER ONLY)
ABS IMMATURE GRANULOCYTES: 0.01 10*3/uL (ref 0.00–0.07)
Basophils Absolute: 0 10*3/uL (ref 0.0–0.1)
Basophils Relative: 1 %
Eosinophils Absolute: 0.1 10*3/uL (ref 0.0–0.5)
Eosinophils Relative: 2 %
HCT: 32 % — ABNORMAL LOW (ref 36.0–46.0)
HEMOGLOBIN: 10.3 g/dL — AB (ref 12.0–15.0)
Immature Granulocytes: 0 %
Lymphocytes Relative: 36 %
Lymphs Abs: 1.4 10*3/uL (ref 0.7–4.0)
MCH: 31.7 pg (ref 26.0–34.0)
MCHC: 32.2 g/dL (ref 30.0–36.0)
MCV: 98.5 fL (ref 80.0–100.0)
Monocytes Absolute: 0.3 10*3/uL (ref 0.1–1.0)
Monocytes Relative: 8 %
NEUTROS ABS: 2 10*3/uL (ref 1.7–7.7)
NEUTROS PCT: 53 %
Platelet Count: 296 10*3/uL (ref 150–400)
RBC: 3.25 MIL/uL — ABNORMAL LOW (ref 3.87–5.11)
RDW: 13.6 % (ref 11.5–15.5)
WBC Count: 3.8 10*3/uL — ABNORMAL LOW (ref 4.0–10.5)
nRBC: 0 % (ref 0.0–0.2)

## 2018-12-06 LAB — CMP (CANCER CENTER ONLY)
ALT: 19 U/L (ref 0–44)
AST: 15 U/L (ref 15–41)
Albumin: 3.7 g/dL (ref 3.5–5.0)
Alkaline Phosphatase: 101 U/L (ref 38–126)
Anion gap: 7 (ref 5–15)
BUN: 12 mg/dL (ref 8–23)
CO2: 26 mmol/L (ref 22–32)
Calcium: 8.8 mg/dL — ABNORMAL LOW (ref 8.9–10.3)
Chloride: 108 mmol/L (ref 98–111)
Creatinine: 0.7 mg/dL (ref 0.44–1.00)
GFR, Est AFR Am: >60 mL/min (ref >60)
GFR, Estimated: >60 mL/min (ref >60)
GLUCOSE: 87 mg/dL (ref 70–99)
Potassium: 4.1 mmol/L (ref 3.5–5.1)
Sodium: 141 mmol/L (ref 135–145)
Total Bilirubin: 0.5 mg/dL (ref 0.3–1.2)
Total Protein: 6.6 g/dL (ref 6.5–8.1)

## 2018-12-06 LAB — RESEARCH LABS

## 2018-12-06 MED ORDER — HEPARIN SOD (PORK) LOCK FLUSH 100 UNIT/ML IV SOLN
500.0000 [IU] | Freq: Once | INTRAVENOUS | Status: AC | PRN
  Administered 2018-12-06: 500 [IU]
  Filled 2018-12-06: qty 5

## 2018-12-06 MED ORDER — TRASTUZUMAB CHEMO 150 MG IV SOLR
2.0000 mg/kg | Freq: Once | INTRAVENOUS | Status: AC
  Administered 2018-12-06: 168 mg via INTRAVENOUS
  Filled 2018-12-06: qty 8

## 2018-12-06 MED ORDER — SODIUM CHLORIDE 0.9 % IV SOLN
Freq: Once | INTRAVENOUS | Status: AC
  Administered 2018-12-06: 12:00:00 via INTRAVENOUS
  Filled 2018-12-06: qty 250

## 2018-12-06 MED ORDER — DIPHENHYDRAMINE HCL 50 MG/ML IJ SOLN
25.0000 mg | Freq: Once | INTRAMUSCULAR | Status: AC
  Administered 2018-12-06: 25 mg via INTRAVENOUS

## 2018-12-06 MED ORDER — SODIUM CHLORIDE 0.9% FLUSH
10.0000 mL | INTRAVENOUS | Status: DC | PRN
  Administered 2018-12-06: 10 mL
  Filled 2018-12-06: qty 10

## 2018-12-06 MED ORDER — ONDANSETRON HCL 4 MG/2ML IJ SOLN
INTRAMUSCULAR | Status: AC
  Filled 2018-12-06: qty 4

## 2018-12-06 MED ORDER — DIPHENHYDRAMINE HCL 50 MG/ML IJ SOLN
INTRAMUSCULAR | Status: AC
  Filled 2018-12-06: qty 1

## 2018-12-06 MED ORDER — PACLITAXEL PROTEIN-BOUND CHEMO INJECTION 100 MG
80.0000 mg/m2 | Freq: Once | INTRAVENOUS | Status: AC
  Administered 2018-12-06: 150 mg via INTRAVENOUS
  Filled 2018-12-06: qty 30

## 2018-12-06 MED ORDER — ONDANSETRON HCL 4 MG/2ML IJ SOLN
8.0000 mg | Freq: Once | INTRAMUSCULAR | Status: AC
  Administered 2018-12-06: 8 mg via INTRAVENOUS

## 2018-12-06 NOTE — Patient Instructions (Signed)
[facility] Discharge Instructions for Patients Receiving Chemotherapy  Today you received the following chemotherapy agents Trastuzumab (HERCEPTIN) & Paclitaxel-Protein Bound (ABRAXANE).  To help prevent nausea and vomiting after your treatment, we encourage you to take your nausea medication as prescribed.   If you develop nausea and vomiting that is not controlled by your nausea medication, call the clinic.   BELOW ARE SYMPTOMS THAT SHOULD BE REPORTED IMMEDIATELY:  *FEVER GREATER THAN 100.5 F  *CHILLS WITH OR WITHOUT FEVER  NAUSEA AND VOMITING THAT IS NOT CONTROLLED WITH YOUR NAUSEA MEDICATION  *UNUSUAL SHORTNESS OF BREATH  *UNUSUAL BRUISING OR BLEEDING  TENDERNESS IN MOUTH AND THROAT WITH OR WITHOUT PRESENCE OF ULCERS  *URINARY PROBLEMS  *BOWEL PROBLEMS  UNUSUAL RASH Items with * indicate a potential emergency and should be followed up as soon as possible.  Feel free to call the clinic should you have any questions or concerns. The clinic phone number is [phone].  Please show the [place] at check-in to the Emergency Department and triage nurse.

## 2018-12-11 DIAGNOSIS — C50211 Malignant neoplasm of upper-inner quadrant of right female breast: Secondary | ICD-10-CM | POA: Diagnosis not present

## 2018-12-11 DIAGNOSIS — Z170 Estrogen receptor positive status [ER+]: Secondary | ICD-10-CM | POA: Diagnosis not present

## 2018-12-11 NOTE — Progress Notes (Signed)
Patient Care Team: Chevis Pretty, FNP as PCP - General (Nurse Practitioner)  DIAGNOSIS:    ICD-10-CM   1. Malignant neoplasm of upper-inner quadrant of right breast in female, estrogen receptor positive ([place]) C50.211    Z17.0     SUMMARY OF ONCOLOGIC HISTORY:   Malignant neoplasm of upper-inner quadrant of right breast in female, estrogen receptor positive ([place])   09/26/2018 Initial Diagnosis    Two right breast masses 12:00 to 1 o'clock position 1.3 cm and 1.2 cm, they are 1.5 cm apart.  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 with above-mentioned history of right breast cancer who iscurrently onchemotherapy with weekly Abraxane and Herceptin. She is a participant in the [employer] clinical trial. She presents to the clinic today with her daughter. She reports a redness, flushing, and rash on her face. She denies any tingling or numbness in hands and feet, myalgias, or nausea. She requested a genetics consult. Her labs show: WBC 5.9, Hg 10.5, platelets 394, ANC 3.9.   REVIEW OF SYSTEMS:   Constitutional: Denies fevers, chills or abnormal weight loss Eyes: Denies blurriness of vision Ears, nose, mouth, throat, and face: Denies mucositis or sore throat Respiratory: Denies cough, dyspnea or wheezes Cardiovascular: Denies palpitation, chest discomfort Gastrointestinal:  Denies nausea, heartburn or change in bowel habits Skin: (+) redness, flushing on face Lymphatics: Denies new lymphadenopathy or easy bruising Neurological: Denies numbness, tingling or new weaknesses Behavioral/Psych: Mood is stable, no new changes  Extremities: No lower extremity edema Breast: denies any pain or lumps or nodules in either breasts All other systems were reviewed with the patient and are negative.  I have reviewed the past medical history, past surgical history, social history and family history with the patient and they are unchanged from previous note.  ALLERGIES:  is allergic to paclitaxel.  MEDICATIONS:  Current Outpatient Medications  Medication Sig Dispense Refill  . lidocaine-prilocaine (EMLA) cream Apply to affected area once 30 g 3  . Multiple Vitamin (MULTIVITAMIN) tablet Take 1 tablet by mouth daily.     . ondansetron (ZOFRAN) 8 MG tablet Take 1 tablet (8 mg total) by mouth 2 (two) times daily as needed (Nausea or vomiting). 30 tablet 1  . prochlorperazine (COMPAZINE) 10 MG tablet Take 1 tablet (10 mg total) by mouth every 6 (six) hours as needed (Nausea or vomiting). 30 tablet 1   No current facility-administered medications for this visit.     PHYSICAL EXAMINATION: ECOG PERFORMANCE STATUS: 1 - Symptomatic but completely ambulatory  Vitals:   12/13/18 0929  BP: (!) 147/83  Pulse: 79  Resp: 17  Temp: 98.4 F (36.9 C)  SpO2: 100%   Filed Weights   12/13/18 0929  Weight: 184 lb (83.5 kg)    GENERAL: alert, no distress and comfortable SKIN: facial flushing and erythema  EYES: normal, Conjunctiva are pink and non-injected, sclera clear OROPHARYNX: no exudate, no erythema and lips, buccal mucosa, and tongue normal  NECK: supple, thyroid normal size, non-tender, without nodularity LYMPH: no palpable lymphadenopathy in the cervical, axillary or inguinal LUNGS: clear to auscultation and percussion with normal breathing effort HEART: regular rate &  rhythm and no murmurs and no lower extremity edema ABDOMEN: abdomen soft, non-tender and normal bowel sounds MUSCULOSKELETAL: no cyanosis of digits and no clubbing  NEURO: alert & oriented x 3 with fluent speech, no focal motor/sensory deficits EXTREMITIES: No lower extremity edema  LABORATORY DATA:  I have reviewed the data as listed CMP Latest Ref Rng & Units 12/06/2018 11/29/2018 11/22/2018  Glucose 70 - 99 mg/dL 87 93 95  BUN 8 - 23 mg/dL '12 19 17  '$ Creatinine 0.44 - 1.00 mg/dL 0.70 0.89 0.78  Sodium 135 - 145 mmol/L 141 141 140  Potassium 3.5 - 5.1 mmol/L 4.1 4.4 4.2  Chloride 98 - 111 mmol/L 108 107 107  CO2 22 - 32 mmol/L '26 25 25  '$ Calcium 8.9 - 10.3 mg/dL 8.8(L) 9.4 8.9  Total Protein 6.5 - 8.1 g/dL 6.6 6.7 6.9  Total Bilirubin 0.3 - 1.2 mg/dL 0.5 0.6 0.8  Alkaline Phos 38 - 126 U/L 101 94 108  AST 15 - 41 U/L '15 16 19  '$ ALT 0 - 44 U/L '19 19 21    '$ Lab Results  Component Value Date   WBC 5.9 12/13/2018   HGB 10.5 (L) 12/13/2018   HCT 33.1 (L) 12/13/2018   MCV 99.4 12/13/2018   PLT 394 12/13/2018   NEUTROABS 3.9 12/13/2018    ASSESSMENT & PLAN:  Malignant neoplasm of upper-inner quadrant of right breast in female, estrogen receptor positive ([place]) 09/27/19:Two right breast masses 12:00 to 1 o'clock position 1.3 cm and 1.2 cm, they are 1.5 cm apart. 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 Neoadjuvant chemotherapy withTaxol Herceptin weekly x4fllowed by Herceptin maintenance vsKadcylafor 1 year 2. Followed by breast conserving surgery if possible with sentinel lymph node study 3. Followed by adjuvant radiation therapy if patient had lumpectomy 4.Followed by antiestrogen  therapy ------------------------------------------------------------------------------------------------------------------------------------------------------------ Current treatment: Cycle6Abraxane Herceptin Echocardiogram 10/29/2018: EF 60 to 65%  Chemo toxicities: Facial warmth during infusions: switched to Abraxane with cycle 5, still slight redness Chemotherapy-induced anemia: Monitoring closely Nausea: Resolved  [name] interested in genetic testing.  We will see if genetics has any availability today.  Return to clinic weekly for chemotherapy other week for follow-up with me    No orders of the defined types were placed in this encounter.  The patient has a good understanding of the overall plan. she agrees with it. she will call with any problems that may develop before the next visit here.  GNicholas Lose MD 12/13/2018  IJulious OkaDorshimer am acting as scribe for Dr. VNicholas Lose  I have reviewed the above documentation for accuracy and completeness, and I agree with the above.

## 2018-12-13 ENCOUNTER — Other Ambulatory Visit: Payer: BLUE CROSS/BLUE SHIELD

## 2018-12-13 ENCOUNTER — Inpatient Hospital Stay: Payer: BLUE CROSS/BLUE SHIELD | Attending: Hematology and Oncology

## 2018-12-13 ENCOUNTER — Encounter: Payer: Self-pay | Admitting: Genetic Counselor

## 2018-12-13 ENCOUNTER — Inpatient Hospital Stay (HOSPITAL_BASED_OUTPATIENT_CLINIC_OR_DEPARTMENT_OTHER): Payer: BLUE CROSS/BLUE SHIELD | Admitting: Genetic Counselor

## 2018-12-13 ENCOUNTER — Inpatient Hospital Stay (HOSPITAL_BASED_OUTPATIENT_CLINIC_OR_DEPARTMENT_OTHER): Payer: BLUE CROSS/BLUE SHIELD | Admitting: Hematology and Oncology

## 2018-12-13 ENCOUNTER — Inpatient Hospital Stay: Payer: BLUE CROSS/BLUE SHIELD

## 2018-12-13 DIAGNOSIS — D6481 Anemia due to antineoplastic chemotherapy: Secondary | ICD-10-CM

## 2018-12-13 DIAGNOSIS — Z315 Encounter for genetic counseling: Secondary | ICD-10-CM

## 2018-12-13 DIAGNOSIS — Z95828 Presence of other vascular implants and grafts: Secondary | ICD-10-CM

## 2018-12-13 DIAGNOSIS — Z8052 Family history of malignant neoplasm of bladder: Secondary | ICD-10-CM | POA: Diagnosis not present

## 2018-12-13 DIAGNOSIS — Z803 Family history of malignant neoplasm of breast: Secondary | ICD-10-CM | POA: Insufficient documentation

## 2018-12-13 DIAGNOSIS — C50211 Malignant neoplasm of upper-inner quadrant of right female breast: Secondary | ICD-10-CM

## 2018-12-13 DIAGNOSIS — Z170 Estrogen receptor positive status [ER+]: Secondary | ICD-10-CM

## 2018-12-13 DIAGNOSIS — Z5112 Encounter for antineoplastic immunotherapy: Secondary | ICD-10-CM | POA: Diagnosis not present

## 2018-12-13 DIAGNOSIS — Z5111 Encounter for antineoplastic chemotherapy: Secondary | ICD-10-CM | POA: Insufficient documentation

## 2018-12-13 LAB — COMPREHENSIVE METABOLIC PANEL
ALBUMIN: 4.1 g/dL (ref 3.5–5.0)
ALK PHOS: 93 U/L (ref 38–126)
ALT: 19 U/L (ref 0–44)
ANION GAP: 5 (ref 5–15)
AST: 17 U/L (ref 15–41)
BUN: 20 mg/dL (ref 8–23)
CO2: 27 mmol/L (ref 22–32)
Calcium: 9 mg/dL (ref 8.9–10.3)
Chloride: 108 mmol/L (ref 98–111)
Creatinine, Ser: 0.73 mg/dL (ref 0.44–1.00)
GFR calc Af Amer: >60 mL/min (ref >60)
GFR calc non Af Amer: >60 mL/min (ref >60)
GLUCOSE: 107 mg/dL — AB (ref 70–99)
Potassium: 4.1 mmol/L (ref 3.5–5.1)
Sodium: 140 mmol/L (ref 135–145)
Total Bilirubin: 0.3 mg/dL (ref 0.3–1.2)
Total Protein: 7.1 g/dL (ref 6.5–8.1)

## 2018-12-13 LAB — CBC WITH DIFFERENTIAL (CANCER CENTER ONLY)
Abs Immature Granulocytes: 0.07 10*3/uL (ref 0.00–0.07)
Basophils Absolute: 0.1 10*3/uL (ref 0.0–0.1)
Basophils Relative: 1 %
Eosinophils Absolute: 0.1 10*3/uL (ref 0.0–0.5)
Eosinophils Relative: 2 %
HCT: 33.1 % — ABNORMAL LOW (ref 36.0–46.0)
Hemoglobin: 10.5 g/dL — ABNORMAL LOW (ref 12.0–15.0)
Immature Granulocytes: 1 %
LYMPHS PCT: 23 %
Lymphs Abs: 1.3 10*3/uL (ref 0.7–4.0)
MCH: 31.5 pg (ref 26.0–34.0)
MCHC: 31.7 g/dL (ref 30.0–36.0)
MCV: 99.4 fL (ref 80.0–100.0)
Monocytes Absolute: 0.5 10*3/uL (ref 0.1–1.0)
Monocytes Relative: 8 %
Neutro Abs: 3.9 10*3/uL (ref 1.7–7.7)
Neutrophils Relative %: 65 %
Platelet Count: 394 10*3/uL (ref 150–400)
RBC: 3.33 MIL/uL — ABNORMAL LOW (ref 3.87–5.11)
RDW: 14 % (ref 11.5–15.5)
WBC Count: 5.9 10*3/uL (ref 4.0–10.5)
nRBC: 0 % (ref 0.0–0.2)

## 2018-12-13 MED ORDER — TRASTUZUMAB CHEMO 150 MG IV SOLR
2.0000 mg/kg | Freq: Once | INTRAVENOUS | Status: AC
  Administered 2018-12-13: 168 mg via INTRAVENOUS
  Filled 2018-12-13: qty 8

## 2018-12-13 MED ORDER — ONDANSETRON HCL 4 MG/2ML IJ SOLN
8.0000 mg | Freq: Once | INTRAMUSCULAR | Status: AC
  Administered 2018-12-13: 8 mg via INTRAVENOUS

## 2018-12-13 MED ORDER — HEPARIN SOD (PORK) LOCK FLUSH 100 UNIT/ML IV SOLN
500.0000 [IU] | Freq: Once | INTRAVENOUS | Status: AC | PRN
  Administered 2018-12-13: 500 [IU]
  Filled 2018-12-13: qty 5

## 2018-12-13 MED ORDER — DIPHENHYDRAMINE HCL 50 MG/ML IJ SOLN
25.0000 mg | Freq: Once | INTRAMUSCULAR | Status: AC
  Administered 2018-12-13: 25 mg via INTRAVENOUS

## 2018-12-13 MED ORDER — PACLITAXEL PROTEIN-BOUND CHEMO INJECTION 100 MG
80.0000 mg/m2 | Freq: Once | INTRAVENOUS | Status: AC
  Administered 2018-12-13: 150 mg via INTRAVENOUS
  Filled 2018-12-13: qty 30

## 2018-12-13 MED ORDER — SODIUM CHLORIDE 0.9% FLUSH
10.0000 mL | Freq: Once | INTRAVENOUS | Status: AC
  Administered 2018-12-13: 10 mL
  Filled 2018-12-13: qty 10

## 2018-12-13 MED ORDER — DIPHENHYDRAMINE HCL 50 MG/ML IJ SOLN
INTRAMUSCULAR | Status: AC
  Filled 2018-12-13: qty 1

## 2018-12-13 MED ORDER — ONDANSETRON HCL 4 MG/2ML IJ SOLN
INTRAMUSCULAR | Status: AC
  Filled 2018-12-13: qty 2

## 2018-12-13 MED ORDER — SODIUM CHLORIDE 0.9 % IV SOLN
Freq: Once | INTRAVENOUS | Status: AC
  Administered 2018-12-13: 10:00:00 via INTRAVENOUS
  Filled 2018-12-13: qty 250

## 2018-12-13 MED ORDER — SODIUM CHLORIDE 0.9% FLUSH
10.0000 mL | INTRAVENOUS | Status: DC | PRN
  Administered 2018-12-13: 10 mL
  Filled 2018-12-13: qty 10

## 2018-12-13 NOTE — Progress Notes (Signed)
REFERRING PROVIDER: Nicholas Lose, MD [address] [place], [place] [address]  PRIMARY PROVIDER:  Chevis Pretty, FNP  PRIMARY REASON FOR VISIT:  1. Malignant neoplasm of upper-inner quadrant of right breast in female, estrogen receptor positive ([place])   2. Family history of breast cancer   3. Family history of bladder cancer      HISTORY OF PRESENT ILLNESS:   Ms. Repetto, a 63 y.o. female, was seen for a [facility] cancer genetics consultation at the request of Dr. Lindi Adie due to a personal and family history of cancer.  Ms. Voong presents to clinic today to discuss the possibility of a hereditary predisposition to cancer, genetic testing, and to further clarify her future cancer risks, as well as potential cancer risks for family members.   In December 2019, at the age of 81, Ms. Gish was diagnosed with two masses of invasive ductal carcinoma of the right breast.  One mass is triple positive and the other is ER+/PR+/Her2-. This is being treated with chemotherapy, and she is scheduled for lumpectomy and radiation afterward.    CANCER HISTORY:    Malignant neoplasm of upper-inner quadrant of right breast in female, estrogen receptor positive ([place])   09/26/2018 Initial Diagnosis    Two right breast masses 12:00 to 1 o'clock position 1.3 cm and 1.2 cm, they are 1.5 cm apart.  Biopsy revealed grade 2 invasive ductal carcinoma ER 70%- 100%, PR 0% -20%, Ki-67 20%, HER-2 3+ by IHC and FISH ratio 2.15 with a gene copy number of 4.2 for the tumor that was 2+ by IHC, T1c N0 stage Ia    10/24/2018 Cancer Staging    Staging form: Breast, AJCC 8th Edition - Clinical stage from 10/24/2018: Stage IA (cT1c(2), cN0, cM0, G2, ER+, PR+, HER2+) - Signed by Nicholas Lose, MD on 10/24/2018    11/08/2018 -  Neo-Adjuvant Chemotherapy    Neoadjuvant chemotherapy with Taxol Herceptin weekly x12 followed by Herceptin maintenance vs Kadcyla for 1 year      HORMONAL RISK FACTORS:  Menarche was  at age 68.  First live birth at age 59.  OCP use for approximately 5 years.  Ovaries intact: yes.  Hysterectomy: no.  Menopausal status: postmenopausal.  HRT use: 0 years. Colonoscopy: yes; normal. Mammogram within the last year: yes. Number of breast biopsies: 1. Up to date with pelvic exams:  yes. Any excessive radiation exposure in the past:  no  Past Medical History:  Diagnosis Date  . Breast cancer ([place])    right  . Family history of bladder cancer   . Family history of breast cancer   . Knee pain, chronic 10/11/2015    Past Surgical History:  Procedure Laterality Date  . COLONOSCOPY  03/2010  . none    . PORTACATH PLACEMENT Left 11/07/2018   Procedure: INSERTION PORT-A-CATH WITH ULTRASOUND;  Surgeon: Jovita Kussmaul, MD;  Location: [facility];  Service: General;  Laterality: Left;    Social History   Socioeconomic History  . Marital status: Married    Spouse name: michael  . Number of children: Not on file  . Years of education: Not on file  . Highest education level: Not on file  Occupational History  . Not on file  Social Needs  . Financial resource strain: Not on file  . Food insecurity:    Worry: Not on file    Inability: Not on file  . Transportation needs:    Medical: No    Non-medical: No  Tobacco Use  . Smoking status: Never Smoker  . Smokeless tobacco: Never Used  Substance and Sexual Activity  . Alcohol use: Yes    Alcohol/week: 1.0 standard drinks    Types: 1 Standard drinks or equivalent per week  . Drug use: No  . Sexual activity: Not on file  Lifestyle  . Physical activity:    Days per week: Not on file    Minutes per session: Not on file  . Stress: Not on file  Relationships  . Social connections:    Talks on phone: Not on file    Gets together: Not on file    Attends religious service: Not on file    Active member of club or organization: Not on file    Attends meetings of clubs or organizations: Not on file     Relationship status: Not on file  Other Topics Concern  . Not on file  Social History Narrative  . Not on file     FAMILY HISTORY:  We obtained a detailed, 4-generation family history.  Significant diagnoses are listed below: Family History  Problem Relation Age of Onset  . Liver disease Mother   . Heart disease Mother        afib  . Heart disease Father   . Asthma Father   . Diabetes Father   . Bladder Cancer Father        smoker  . Breast cancer Maternal Aunt   . Heart disease Maternal Grandfather   . Kidney disease Paternal Grandmother   . Heart disease Paternal Grandfather   . Heart disease Other   . Breast cancer Maternal Aunt        mets to bone    The patient has two children who are cancer free.  She has a sister who does not have cancer.  Both parents are deceased.  The patient's mother died of heart failure.  She had seven sisters and a brother.  Three sisters had breast cancer.  The maternal grandparent are deceased from non cancer related issues.  The patient's father is deceased from bladder cancer and heart failure.  He was a smoker.  He had one maternal half sister, and four paternal half sisters and seven paternal half brothers.  None reportedly had cancer.  Ms. Silveria is unaware of previous family history of genetic testing for hereditary cancer risks. Patient's maternal ancestors are of [place] descent, and paternal ancestors are of [place] descent. There is no reported Ashkenazi Jewish ancestry. There is no known consanguinity.  GENETIC COUNSELING ASSESSMENT: Jeidy Hoerner is a 63 y.o. female with a personal and family history of breast cancer which is somewhat suggestive of a hereditary cancer syndrome and predisposition to cancer. We, therefore, discussed and recommended the following at today's visit.   DISCUSSION: We discussed that about 5-10% of breast cancer is hereditary with most cases due to BRCA mutations.  There are other genes that can  increase the risk for hereditary breast cancer syndromes outside of BRCA mutations.  Based on the patient's age, as well as the ages of her maternal aunts who had breast cancer, we discussed that the most likely scenario is that this is a familial breast cancer and not hereditary.  We reviewed the characteristics, features and inheritance patterns of hereditary cancer syndromes. We also discussed genetic testing, including the appropriate family members to test, the process of testing, insurance coverage and turn-around-time for results. We discussed the implications of a negative, positive and/or variant  of uncertain significant result. We recommended Ms. Dickard pursue genetic testing for the common hereditary cancer gene panel. The Hereditary Gene Panel offered by [facility] includes sequencing and/or deletion duplication testing of the following 47 genes: APC, ATM, AXIN2, BARD1, BMPR1A, BRCA1, BRCA2, BRIP1, CDH1, CDK4, CDKN2A (p14ARF), CDKN2A (p16INK4a), CHEK2, CTNNA1, DICER1, EPCAM (Deletion/duplication testing only), GREM1 (promoter region deletion/duplication testing only), KIT, MEN1, MLH1, MSH2, MSH3, MSH6, MUTYH, NBN, NF1, NHTL1, PALB2, PDGFRA, PMS2, POLD1, POLE, PTEN, RAD50, RAD51C, RAD51D, SDHB, SDHC, SDHD, SMAD4, SMARCA4. STK11, TP53, TSC1, TSC2, and VHL.  The following genes were evaluated for sequence changes only: SDHA and HOXB13 c.251G>A variant only.   Based on Ms. Pilar's personal and family history of cancer, she meets medical criteria for genetic testing. Despite that she meets criteria, she may still have an out of pocket cost. We discussed that if her out of pocket cost for testing is over $100, the laboratory will call and confirm whether she wants to proceed with testing.  If the out of pocket cost of testing is less than $100 she will be billed by the genetic testing laboratory.   PLAN: After considering the risks, benefits, and limitations, Ms. Brotzman  provided informed consent to pursue  genetic testing.  A blood sample will be drawn at her next infusion visit and will be sent to [facility] for analysis of the Common Hereditary Cancer Panel. Results should be available within approximately 2-3 weeks' time, at which point they will be disclosed by telephone to Ms. Massaro, as will any additional recommendations warranted by these results. Ms. Beam will receive a summary of her genetic counseling visit and a copy of her results once available. This information will also be available in Epic. We encouraged Ms. Granderson to remain in contact with cancer genetics annually so that we can continuously update the family history and inform her of any changes in cancer genetics and testing that may be of benefit for her family. Ms. Riesen questions were answered to her satisfaction today. Our contact information was provided should additional questions or concerns arise.  Lastly, we encouraged Ms. Mcmillion to remain in contact with cancer genetics annually so that we can continuously update the family history and inform her of any changes in cancer genetics and testing that may be of benefit for this family.   Ms.  Bonello questions were answered to her satisfaction today. Our contact information was provided should additional questions or concerns arise. Thank you for the referral and allowing [place] to share in the care of your patient.   Karen P. Florene Glen, [place], [facility] Certified Genetic Counselor [email or web address] phone: [phone]  The patient was seen for a total of 35 minutes in face-to-face genetic counseling.  This patient was discussed with Drs. Magrinat, Lindi Adie and/or Burr Medico who agrees with the above.    _______________________________________________________________________ For Office Staff:  Number of people involved in session: 2 Was an Intern/ student involved with case: no

## 2018-12-13 NOTE — Patient Instructions (Addendum)
[facility] Discharge Instructions for Patients Receiving Chemotherapy  Today you received the following chemotherapy & immunotherapy agents:  Herceptin & Abraxane  To help prevent nausea and vomiting after your treatment, we encourage you to take your nausea medication as needed per MD.   If you develop nausea and vomiting that is not controlled by your nausea medication, call the clinic.   BELOW ARE SYMPTOMS THAT SHOULD BE REPORTED IMMEDIATELY:  *FEVER GREATER THAN 100.5 F  *CHILLS WITH OR WITHOUT FEVER  NAUSEA AND VOMITING THAT IS NOT CONTROLLED WITH YOUR NAUSEA MEDICATION  *UNUSUAL SHORTNESS OF BREATH  *UNUSUAL BRUISING OR BLEEDING  TENDERNESS IN MOUTH AND THROAT WITH OR WITHOUT PRESENCE OF ULCERS  *URINARY PROBLEMS  *BOWEL PROBLEMS  UNUSUAL RASH Items with * indicate a potential emergency and should be followed up as soon as possible.  Feel free to call the clinic should you have any questions or concerns. The clinic phone number is [phone].  Please show the [facility] at check-in to the Emergency Department and triage nurse.

## 2018-12-13 NOTE — Assessment & Plan Note (Signed)
09/27/19:Two right breast masses 12:00 to 1 o'clock position 1.3 cm and 1.2 cm, they are 1.5 cm apart. 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 Neoadjuvant chemotherapy withTaxol Herceptin weekly x56fllowed by Herceptin maintenance vsKadcylafor 1 year 2. Followed by breast conserving surgery if possible with sentinel lymph node study 3. Followed by adjuvant radiation therapy if patient had lumpectomy 4.Followed by antiestrogen therapy ------------------------------------------------------------------------------------------------------------------------------------------------------------ Current treatment: Cycle6Abraxane Herceptin Echocardiogram 10/29/2018: EF 60 to 65%  Chemo toxicities: Facial warmth during infusions: switched to Abraxane with cycle 5 Nausea Return to clinic weekly for chemotherapy other week for follow-up with me

## 2018-12-20 ENCOUNTER — Inpatient Hospital Stay: Payer: BLUE CROSS/BLUE SHIELD

## 2018-12-20 ENCOUNTER — Other Ambulatory Visit: Payer: Self-pay

## 2018-12-20 VITALS — BP 124/79 | HR 87 | Temp 98.3°F | Resp 18

## 2018-12-20 DIAGNOSIS — Z5112 Encounter for antineoplastic immunotherapy: Secondary | ICD-10-CM | POA: Diagnosis not present

## 2018-12-20 DIAGNOSIS — Z803 Family history of malignant neoplasm of breast: Secondary | ICD-10-CM

## 2018-12-20 DIAGNOSIS — Z5111 Encounter for antineoplastic chemotherapy: Secondary | ICD-10-CM | POA: Diagnosis not present

## 2018-12-20 DIAGNOSIS — Z8052 Family history of malignant neoplasm of bladder: Secondary | ICD-10-CM

## 2018-12-20 DIAGNOSIS — C50211 Malignant neoplasm of upper-inner quadrant of right female breast: Secondary | ICD-10-CM

## 2018-12-20 DIAGNOSIS — D6481 Anemia due to antineoplastic chemotherapy: Secondary | ICD-10-CM | POA: Diagnosis not present

## 2018-12-20 DIAGNOSIS — Z95828 Presence of other vascular implants and grafts: Secondary | ICD-10-CM

## 2018-12-20 DIAGNOSIS — Z170 Estrogen receptor positive status [ER+]: Principal | ICD-10-CM

## 2018-12-20 LAB — CMP (CANCER CENTER ONLY)
ALT: 15 U/L (ref 0–44)
AST: 14 U/L — ABNORMAL LOW (ref 15–41)
Albumin: 3.7 g/dL (ref 3.5–5.0)
Alkaline Phosphatase: 108 U/L (ref 38–126)
Anion gap: 9 (ref 5–15)
BUN: 13 mg/dL (ref 8–23)
CO2: 27 mmol/L (ref 22–32)
Calcium: 9.1 mg/dL (ref 8.9–10.3)
Chloride: 104 mmol/L (ref 98–111)
Creatinine: 0.72 mg/dL (ref 0.44–1.00)
GFR, Est AFR Am: >60 mL/min (ref >60)
GFR, Estimated: >60 mL/min (ref >60)
Glucose, Bld: 108 mg/dL — ABNORMAL HIGH (ref 70–99)
Potassium: 4 mmol/L (ref 3.5–5.1)
Sodium: 140 mmol/L (ref 135–145)
Total Bilirubin: 0.6 mg/dL (ref 0.3–1.2)
Total Protein: 6.7 g/dL (ref 6.5–8.1)

## 2018-12-20 LAB — CBC WITH DIFFERENTIAL (CANCER CENTER ONLY)
Abs Immature Granulocytes: 0.03 10*3/uL (ref 0.00–0.07)
Basophils Absolute: 0 10*3/uL (ref 0.0–0.1)
Basophils Relative: 1 %
Eosinophils Absolute: 0.1 10*3/uL (ref 0.0–0.5)
Eosinophils Relative: 2 %
HCT: 32.4 % — ABNORMAL LOW (ref 36.0–46.0)
Hemoglobin: 10.4 g/dL — ABNORMAL LOW (ref 12.0–15.0)
Immature Granulocytes: 1 %
Lymphocytes Relative: 27 %
Lymphs Abs: 1.5 10*3/uL (ref 0.7–4.0)
MCH: 31.9 pg (ref 26.0–34.0)
MCHC: 32.1 g/dL (ref 30.0–36.0)
MCV: 99.4 fL (ref 80.0–100.0)
MONO ABS: 0.4 10*3/uL (ref 0.1–1.0)
Monocytes Relative: 6 %
Neutro Abs: 3.5 10*3/uL (ref 1.7–7.7)
Neutrophils Relative %: 63 %
Platelet Count: 307 10*3/uL (ref 150–400)
RBC: 3.26 MIL/uL — ABNORMAL LOW (ref 3.87–5.11)
RDW: 14.5 % (ref 11.5–15.5)
WBC Count: 5.6 10*3/uL (ref 4.0–10.5)
nRBC: 0 % (ref 0.0–0.2)

## 2018-12-20 MED ORDER — DIPHENHYDRAMINE HCL 50 MG/ML IJ SOLN
INTRAMUSCULAR | Status: AC
  Filled 2018-12-20: qty 1

## 2018-12-20 MED ORDER — HEPARIN SOD (PORK) LOCK FLUSH 100 UNIT/ML IV SOLN
500.0000 [IU] | Freq: Once | INTRAVENOUS | Status: AC | PRN
  Administered 2018-12-20: 500 [IU]
  Filled 2018-12-20: qty 5

## 2018-12-20 MED ORDER — SODIUM CHLORIDE 0.9 % IV SOLN
Freq: Once | INTRAVENOUS | Status: AC
  Administered 2018-12-20: 13:00:00 via INTRAVENOUS
  Filled 2018-12-20: qty 250

## 2018-12-20 MED ORDER — PACLITAXEL PROTEIN-BOUND CHEMO INJECTION 100 MG
80.0000 mg/m2 | Freq: Once | INTRAVENOUS | Status: AC
  Administered 2018-12-20: 150 mg via INTRAVENOUS
  Filled 2018-12-20: qty 30

## 2018-12-20 MED ORDER — DIPHENHYDRAMINE HCL 50 MG/ML IJ SOLN
25.0000 mg | Freq: Once | INTRAMUSCULAR | Status: AC
  Administered 2018-12-20: 25 mg via INTRAVENOUS

## 2018-12-20 MED ORDER — SODIUM CHLORIDE 0.9% FLUSH
10.0000 mL | Freq: Once | INTRAVENOUS | Status: AC
  Administered 2018-12-20: 10 mL
  Filled 2018-12-20: qty 10

## 2018-12-20 MED ORDER — ONDANSETRON HCL 4 MG/2ML IJ SOLN
INTRAMUSCULAR | Status: AC
  Filled 2018-12-20: qty 4

## 2018-12-20 MED ORDER — SODIUM CHLORIDE 0.9% FLUSH
10.0000 mL | INTRAVENOUS | Status: DC | PRN
  Administered 2018-12-20: 10 mL
  Filled 2018-12-20: qty 10

## 2018-12-20 MED ORDER — ONDANSETRON HCL 4 MG/2ML IJ SOLN
8.0000 mg | Freq: Once | INTRAMUSCULAR | Status: AC
  Administered 2018-12-20: 8 mg via INTRAVENOUS

## 2018-12-20 MED ORDER — TRASTUZUMAB CHEMO 150 MG IV SOLR
2.0000 mg/kg | Freq: Once | INTRAVENOUS | Status: AC
  Administered 2018-12-20: 168 mg via INTRAVENOUS
  Filled 2018-12-20: qty 8

## 2018-12-20 NOTE — Patient Instructions (Signed)
[facility] Discharge Instructions for Patients Receiving Chemotherapy  Today you received the following chemotherapy agents Trastuzumab (HERCEPTIN) & Paclitaxel-Protein Bound (ABRAXANE).  To help prevent nausea and vomiting after your treatment, we encourage you to take your nausea medication as prescribed.   If you develop nausea and vomiting that is not controlled by your nausea medication, call the clinic.   BELOW ARE SYMPTOMS THAT SHOULD BE REPORTED IMMEDIATELY:  *FEVER GREATER THAN 100.5 F  *CHILLS WITH OR WITHOUT FEVER  NAUSEA AND VOMITING THAT IS NOT CONTROLLED WITH YOUR NAUSEA MEDICATION  *UNUSUAL SHORTNESS OF BREATH  *UNUSUAL BRUISING OR BLEEDING  TENDERNESS IN MOUTH AND THROAT WITH OR WITHOUT PRESENCE OF ULCERS  *URINARY PROBLEMS  *BOWEL PROBLEMS  UNUSUAL RASH Items with * indicate a potential emergency and should be followed up as soon as possible.  Feel free to call the clinic should you have any questions or concerns. The clinic phone number is [phone].  Please show the [facility] at check-in to the Emergency Department and triage nurse.

## 2018-12-26 NOTE — Progress Notes (Signed)
Patient Care Team: Chevis Pretty, FNP as PCP - General (Nurse Practitioner)  DIAGNOSIS:    ICD-10-CM   1. Malignant neoplasm of upper-inner quadrant of right breast in female, estrogen receptor positive ([place]) C50.211    Z17.0     SUMMARY OF ONCOLOGIC HISTORY:   Malignant neoplasm of upper-inner quadrant of right breast in female, estrogen receptor positive ([place])   09/26/2018 Initial Diagnosis    Two right breast masses 12:00 to 1 o'clock position 1.3 cm and 1.2 cm, they are 1.5 cm apart.  Biopsy revealed grade 2 invasive ductal carcinoma ER 70%- 100%, PR 0% -20%, Ki-67 20%, HER-2 3+ by IHC and FISH ratio 2.15 with a gene copy number of 4.2 for the tumor that was 2+ by IHC, T1c N0 stage Ia    10/24/2018 Cancer Staging    Staging form: Breast, AJCC 8th Edition - Clinical stage from 10/24/2018: Stage IA (cT1c(2), cN0, cM0, G2, ER+, PR+, HER2+) - Signed by Nicholas Lose, MD on 10/24/2018    11/08/2018 -  Neo-Adjuvant Chemotherapy    Neoadjuvant chemotherapy with Taxol Herceptin weekly x12 followed by Herceptin maintenance vs Kadcyla for 1 year     CHIEF COMPLIANT: Cycle8Abraxaneand Herceptin  INTERVAL HISTORY: Judith Thomas is a 63 y.o. with above-mentioned history of right breast cancer who iscurrently onneoadjuvant chemotherapy with weeklyAbraxaneand Herceptin. She is a participant in the UpBeat clinical trial. She presents to the clinic today with her husband. She tolerated her last treatment well and denies any nausea, tingling or numbness, constipation or diarrhea. She reports a loss of appetite and taste. She reports a slight increase in her resting heart rate. Her labs from today show: WBC 6.2, Hg 10.7, platelets 331, ANC 3.9.  REVIEW OF SYSTEMS:   Constitutional: Denies fevers, chills or abnormal weight loss (+) loss of appetite (+) loss of taste Eyes: Denies blurriness of vision Ears, nose, mouth, throat, and face: Denies mucositis or sore throat Respiratory:  Denies cough, dyspnea or wheezes Cardiovascular: Denies palpitation, chest discomfort Gastrointestinal: Denies nausea, heartburn or change in bowel habits Skin: Denies abnormal skin rashes Lymphatics: Denies new lymphadenopathy or easy bruising Neurological: Denies numbness, tingling or new weaknesses Behavioral/Psych: Mood is stable, no new changes  Extremities: No lower extremity edema Breast: denies any pain or lumps or nodules in either breasts All other systems were reviewed with the patient and are negative.  I have reviewed the past medical history, past surgical history, social history and family history with the patient and they are unchanged from previous note.  ALLERGIES:  is allergic to paclitaxel.  MEDICATIONS:  Current Outpatient Medications  Medication Sig Dispense Refill   lidocaine-prilocaine (EMLA) cream Apply to affected area once 30 g 3   Multiple Vitamin (MULTIVITAMIN) tablet Take 1 tablet by mouth daily.      ondansetron (ZOFRAN) 8 MG tablet Take 1 tablet (8 mg total) by mouth 2 (two) times daily as needed (Nausea or vomiting). 30 tablet 1   prochlorperazine (COMPAZINE) 10 MG tablet Take 1 tablet (10 mg total) by mouth every 6 (six) hours as needed (Nausea or vomiting). 30 tablet 1   No current facility-administered medications for this visit.     PHYSICAL EXAMINATION: ECOG PERFORMANCE STATUS: 1 - Symptomatic but completely ambulatory  Vitals:   12/27/18 1142  BP: 131/79  Pulse: 76  Resp: 18  Temp: 98.4 F (36.9 C)  SpO2: 100%   Filed Weights   12/27/18 1142  Weight: 183 lb 1.6 oz (83.1 kg)  GENERAL: alert, no distress and comfortable SKIN: skin color, texture, turgor are normal, no rashes or significant lesions EYES: normal, Conjunctiva are pink and non-injected, sclera clear OROPHARYNX: no exudate, no erythema and lips, buccal mucosa, and tongue normal  NECK: supple, thyroid normal size, non-tender, without nodularity LYMPH: no palpable  lymphadenopathy in the cervical, axillary or inguinal LUNGS: clear to auscultation and percussion with normal breathing effort HEART: regular rate & rhythm and no murmurs and no lower extremity edema ABDOMEN: abdomen soft, non-tender and normal bowel sounds MUSCULOSKELETAL: no cyanosis of digits and no clubbing  NEURO: alert & oriented x 3 with fluent speech, no focal motor/sensory deficits EXTREMITIES: No lower extremity edema  LABORATORY DATA:  I have reviewed the data as listed CMP Latest Ref Rng & Units 12/27/2018 12/20/2018 12/13/2018  Glucose 70 - 99 mg/dL 91 108(H) 107(H)  BUN 8 - 23 mg/dL '11 13 20  '$ Creatinine 0.44 - 1.00 mg/dL 0.73 0.72 0.73  Sodium 135 - 145 mmol/L 140 140 140  Potassium 3.5 - 5.1 mmol/L 4.5 4.0 4.1  Chloride 98 - 111 mmol/L 107 104 108  CO2 22 - 32 mmol/L '25 27 27  '$ Calcium 8.9 - 10.3 mg/dL 8.8(L) 9.1 9.0  Total Protein 6.5 - 8.1 g/dL 6.9 6.7 7.1  Total Bilirubin 0.3 - 1.2 mg/dL 0.6 0.6 0.3  Alkaline Phos 38 - 126 U/L 110 108 93  AST 15 - 41 U/L 14(L) 14(L) 17  ALT 0 - 44 U/L '17 15 19    '$ Lab Results  Component Value Date   WBC 6.2 12/27/2018   HGB 10.7 (L) 12/27/2018   HCT 32.4 (L) 12/27/2018   MCV 99.4 12/27/2018   PLT 331 12/27/2018   NEUTROABS 3.9 12/27/2018    ASSESSMENT & PLAN:  Malignant neoplasm of upper-inner quadrant of right breast in female, estrogen receptor positive ([facility]) Malignant neoplasm of upper-inner quadrant of right breast in female, estrogen receptor positive ([place]) 09/27/19:Two right breast masses 12:00 to 1 o'clock position 1.3 cm and 1.2 cm, they are 1.5 cm apart. 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 Neoadjuvant chemotherapy withTaxol Herceptin weekly x78fllowed by Herceptin maintenance vsKadcylafor 1 year 2. Followed by breast conserving  surgery if possible with sentinel lymph node study 3. Followed by adjuvant radiation therapy if patient had lumpectomy 4.Followed by antiestrogen therapy ------------------------------------------------------------------------------------------------------------------------------------------------------------ Current treatment: Cycle8AbraxaneHerceptin Echocardiogram 10/29/2018: EF 60 to 65%  Chemo toxicities: Facial warmth during infusions:switched to Abraxane with cycle 5, still slight redness Chemotherapy-induced anemia: Monitoring closely Nausea: Resolved   Return to clinic weekly for chemotherapy other week for follow-up with me   No orders of the defined types were placed in this encounter.  The patient has a good understanding of the overall plan. she agrees with it. she will call with any problems that may develop before the next visit here.  GNicholas Lose MD 12/27/2018  IJulious OkaDorshimer am acting as scribe for Dr. VNicholas Lose  I have reviewed the above documentation for accuracy and completeness, and I agree with the above.

## 2018-12-27 ENCOUNTER — Inpatient Hospital Stay: Payer: BLUE CROSS/BLUE SHIELD

## 2018-12-27 ENCOUNTER — Other Ambulatory Visit: Payer: Self-pay

## 2018-12-27 ENCOUNTER — Inpatient Hospital Stay (HOSPITAL_BASED_OUTPATIENT_CLINIC_OR_DEPARTMENT_OTHER): Payer: BLUE CROSS/BLUE SHIELD | Admitting: Hematology and Oncology

## 2018-12-27 DIAGNOSIS — R630 Anorexia: Secondary | ICD-10-CM

## 2018-12-27 DIAGNOSIS — Z5112 Encounter for antineoplastic immunotherapy: Secondary | ICD-10-CM | POA: Diagnosis not present

## 2018-12-27 DIAGNOSIS — C50211 Malignant neoplasm of upper-inner quadrant of right female breast: Secondary | ICD-10-CM

## 2018-12-27 DIAGNOSIS — Z95828 Presence of other vascular implants and grafts: Secondary | ICD-10-CM

## 2018-12-27 DIAGNOSIS — D6481 Anemia due to antineoplastic chemotherapy: Secondary | ICD-10-CM | POA: Diagnosis not present

## 2018-12-27 DIAGNOSIS — Z170 Estrogen receptor positive status [ER+]: Secondary | ICD-10-CM | POA: Diagnosis not present

## 2018-12-27 DIAGNOSIS — Z5111 Encounter for antineoplastic chemotherapy: Secondary | ICD-10-CM | POA: Diagnosis not present

## 2018-12-27 LAB — CMP (CANCER CENTER ONLY)
ALT: 17 U/L (ref 0–44)
AST: 14 U/L — ABNORMAL LOW (ref 15–41)
Albumin: 3.9 g/dL (ref 3.5–5.0)
Alkaline Phosphatase: 110 U/L (ref 38–126)
Anion gap: 8 (ref 5–15)
BUN: 11 mg/dL (ref 8–23)
CO2: 25 mmol/L (ref 22–32)
Calcium: 8.8 mg/dL — ABNORMAL LOW (ref 8.9–10.3)
Chloride: 107 mmol/L (ref 98–111)
Creatinine: 0.73 mg/dL (ref 0.44–1.00)
GFR, Est AFR Am: >60 mL/min (ref >60)
GFR, Estimated: >60 mL/min (ref >60)
Glucose, Bld: 91 mg/dL (ref 70–99)
Potassium: 4.5 mmol/L (ref 3.5–5.1)
Sodium: 140 mmol/L (ref 135–145)
TOTAL PROTEIN: 6.9 g/dL (ref 6.5–8.1)
Total Bilirubin: 0.6 mg/dL (ref 0.3–1.2)

## 2018-12-27 LAB — CBC WITH DIFFERENTIAL (CANCER CENTER ONLY)
Abs Immature Granulocytes: 0.04 10*3/uL (ref 0.00–0.07)
Basophils Absolute: 0.1 10*3/uL (ref 0.0–0.1)
Basophils Relative: 1 %
Eosinophils Absolute: 0.1 10*3/uL (ref 0.0–0.5)
Eosinophils Relative: 2 %
HCT: 32.4 % — ABNORMAL LOW (ref 36.0–46.0)
Hemoglobin: 10.7 g/dL — ABNORMAL LOW (ref 12.0–15.0)
Immature Granulocytes: 1 %
Lymphocytes Relative: 26 %
Lymphs Abs: 1.6 10*3/uL (ref 0.7–4.0)
MCH: 32.8 pg (ref 26.0–34.0)
MCHC: 33 g/dL (ref 30.0–36.0)
MCV: 99.4 fL (ref 80.0–100.0)
Monocytes Absolute: 0.4 10*3/uL (ref 0.1–1.0)
Monocytes Relative: 7 %
Neutro Abs: 3.9 10*3/uL (ref 1.7–7.7)
Neutrophils Relative %: 63 %
Platelet Count: 331 10*3/uL (ref 150–400)
RBC: 3.26 MIL/uL — ABNORMAL LOW (ref 3.87–5.11)
RDW: 14.8 % (ref 11.5–15.5)
WBC Count: 6.2 10*3/uL (ref 4.0–10.5)
nRBC: 0 % (ref 0.0–0.2)

## 2018-12-27 MED ORDER — PACLITAXEL PROTEIN-BOUND CHEMO INJECTION 100 MG
80.0000 mg/m2 | Freq: Once | INTRAVENOUS | Status: AC
  Administered 2018-12-27: 150 mg via INTRAVENOUS
  Filled 2018-12-27: qty 30

## 2018-12-27 MED ORDER — DIPHENHYDRAMINE HCL 50 MG/ML IJ SOLN
25.0000 mg | Freq: Once | INTRAMUSCULAR | Status: AC
  Administered 2018-12-27: 25 mg via INTRAVENOUS

## 2018-12-27 MED ORDER — SODIUM CHLORIDE 0.9 % IV SOLN
Freq: Once | INTRAVENOUS | Status: AC
  Administered 2018-12-27: 13:00:00 via INTRAVENOUS
  Filled 2018-12-27: qty 250

## 2018-12-27 MED ORDER — ONDANSETRON HCL 4 MG/2ML IJ SOLN
8.0000 mg | Freq: Once | INTRAMUSCULAR | Status: AC
  Administered 2018-12-27: 8 mg via INTRAVENOUS

## 2018-12-27 MED ORDER — ONDANSETRON HCL 4 MG/2ML IJ SOLN
INTRAMUSCULAR | Status: AC
  Filled 2018-12-27: qty 4

## 2018-12-27 MED ORDER — SODIUM CHLORIDE 0.9% FLUSH
10.0000 mL | Freq: Once | INTRAVENOUS | Status: AC
  Administered 2018-12-27: 10 mL
  Filled 2018-12-27: qty 10

## 2018-12-27 MED ORDER — HEPARIN SOD (PORK) LOCK FLUSH 100 UNIT/ML IV SOLN
500.0000 [IU] | Freq: Once | INTRAVENOUS | Status: AC | PRN
  Administered 2018-12-27: 500 [IU]
  Filled 2018-12-27: qty 5

## 2018-12-27 MED ORDER — DIPHENHYDRAMINE HCL 50 MG/ML IJ SOLN
INTRAMUSCULAR | Status: AC
  Filled 2018-12-27: qty 1

## 2018-12-27 MED ORDER — SODIUM CHLORIDE 0.9% FLUSH
10.0000 mL | INTRAVENOUS | Status: DC | PRN
  Administered 2018-12-27: 10 mL
  Filled 2018-12-27: qty 10

## 2018-12-27 MED ORDER — TRASTUZUMAB CHEMO 150 MG IV SOLR
2.0000 mg/kg | Freq: Once | INTRAVENOUS | Status: AC
  Administered 2018-12-27: 168 mg via INTRAVENOUS
  Filled 2018-12-27: qty 8

## 2018-12-27 NOTE — Assessment & Plan Note (Signed)
Malignant neoplasm of upper-inner quadrant of right breast in female, estrogen receptor positive ([place]) 09/27/19:Two right breast masses 12:00 to 1 o'clock position 1.3 cm and 1.2 cm, they are 1.5 cm apart. 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 Neoadjuvant chemotherapy withTaxol Herceptin weekly x81fllowed by Herceptin maintenance vsKadcylafor 1 year 2. Followed by breast conserving surgery if possible with sentinel lymph node study 3. Followed by adjuvant radiation therapy if patient had lumpectomy 4.Followed by antiestrogen therapy ------------------------------------------------------------------------------------------------------------------------------------------------------------ Current treatment: Cycle8AbraxaneHerceptin Echocardiogram 10/29/2018: EF 60 to 65%  Chemo toxicities: Facial warmth during infusions:switched to Abraxane with cycle 5, still slight redness Chemotherapy-induced anemia: Monitoring closely Nausea: Resolved   Return to clinic weekly for chemotherapy other week for follow-up with me

## 2018-12-27 NOTE — Patient Instructions (Signed)
[facility] Discharge Instructions for Patients Receiving Chemotherapy  Today you received the following chemotherapy agents Trastuzumab (HERCEPTIN) & Paclitaxel-Protein Bound (ABRAXANE).  To help prevent nausea and vomiting after your treatment, we encourage you to take your nausea medication as prescribed.   If you develop nausea and vomiting that is not controlled by your nausea medication, call the clinic.   BELOW ARE SYMPTOMS THAT SHOULD BE REPORTED IMMEDIATELY:  *FEVER GREATER THAN 100.5 F  *CHILLS WITH OR WITHOUT FEVER  NAUSEA AND VOMITING THAT IS NOT CONTROLLED WITH YOUR NAUSEA MEDICATION  *UNUSUAL SHORTNESS OF BREATH  *UNUSUAL BRUISING OR BLEEDING  TENDERNESS IN MOUTH AND THROAT WITH OR WITHOUT PRESENCE OF ULCERS  *URINARY PROBLEMS  *BOWEL PROBLEMS  UNUSUAL RASH Items with * indicate a potential emergency and should be followed up as soon as possible.  Feel free to call the clinic should you have any questions or concerns. The clinic phone number is [phone].  Please show the [facility] at check-in to the Emergency Department and triage nurse.

## 2018-12-31 ENCOUNTER — Telehealth: Payer: Self-pay

## 2018-12-31 NOTE — Telephone Encounter (Signed)
Dorris patient to schedule 3 month UPBEAT visit. Told patient that it could be done any time in April and up to May 23rd. Patient preferred May but is having her surgery in May and wants to wait and find out the date of her surgery. Pt says she will call this RN back next week after she finds out. Thanked patient for her time and continued participation. Johney Maine RN, BSN Clinical Research  12/31/18 1:01 PM

## 2019-01-03 ENCOUNTER — Other Ambulatory Visit: Payer: Self-pay

## 2019-01-03 ENCOUNTER — Inpatient Hospital Stay: Payer: BLUE CROSS/BLUE SHIELD

## 2019-01-03 VITALS — BP 133/89 | HR 72 | Temp 98.4°F | Resp 18 | Wt 186.8 lb

## 2019-01-03 DIAGNOSIS — Z5112 Encounter for antineoplastic immunotherapy: Secondary | ICD-10-CM | POA: Diagnosis not present

## 2019-01-03 DIAGNOSIS — Z5111 Encounter for antineoplastic chemotherapy: Secondary | ICD-10-CM | POA: Diagnosis not present

## 2019-01-03 DIAGNOSIS — C50211 Malignant neoplasm of upper-inner quadrant of right female breast: Secondary | ICD-10-CM

## 2019-01-03 DIAGNOSIS — Z95828 Presence of other vascular implants and grafts: Secondary | ICD-10-CM

## 2019-01-03 DIAGNOSIS — D6481 Anemia due to antineoplastic chemotherapy: Secondary | ICD-10-CM | POA: Diagnosis not present

## 2019-01-03 DIAGNOSIS — Z170 Estrogen receptor positive status [ER+]: Principal | ICD-10-CM

## 2019-01-03 LAB — CMP (CANCER CENTER ONLY)
ALT: 18 U/L (ref 0–44)
AST: 15 U/L (ref 15–41)
Albumin: 3.9 g/dL (ref 3.5–5.0)
Alkaline Phosphatase: 107 U/L (ref 38–126)
Anion gap: 9 (ref 5–15)
BUN: 11 mg/dL (ref 8–23)
CO2: 26 mmol/L (ref 22–32)
Calcium: 8.8 mg/dL — ABNORMAL LOW (ref 8.9–10.3)
Chloride: 107 mmol/L (ref 98–111)
Creatinine: 0.72 mg/dL (ref 0.44–1.00)
GFR, Est AFR Am: >60 mL/min (ref >60)
GFR, Estimated: >60 mL/min (ref >60)
Glucose, Bld: 85 mg/dL (ref 70–99)
Potassium: 4.2 mmol/L (ref 3.5–5.1)
Sodium: 142 mmol/L (ref 135–145)
TOTAL PROTEIN: 7.1 g/dL (ref 6.5–8.1)
Total Bilirubin: 0.4 mg/dL (ref 0.3–1.2)

## 2019-01-03 LAB — CBC WITH DIFFERENTIAL (CANCER CENTER ONLY)
Abs Immature Granulocytes: 0.02 10*3/uL (ref 0.00–0.07)
BASOS PCT: 1 %
Basophils Absolute: 0.1 10*3/uL (ref 0.0–0.1)
Eosinophils Absolute: 0.1 10*3/uL (ref 0.0–0.5)
Eosinophils Relative: 2 %
HCT: 34.9 % — ABNORMAL LOW (ref 36.0–46.0)
Hemoglobin: 11.3 g/dL — ABNORMAL LOW (ref 12.0–15.0)
Immature Granulocytes: 0 %
Lymphocytes Relative: 25 %
Lymphs Abs: 1.3 10*3/uL (ref 0.7–4.0)
MCH: 32.8 pg (ref 26.0–34.0)
MCHC: 32.4 g/dL (ref 30.0–36.0)
MCV: 101.2 fL — AB (ref 80.0–100.0)
Monocytes Absolute: 0.3 10*3/uL (ref 0.1–1.0)
Monocytes Relative: 6 %
Neutro Abs: 3.6 10*3/uL (ref 1.7–7.7)
Neutrophils Relative %: 66 %
Platelet Count: 350 10*3/uL (ref 150–400)
RBC: 3.45 MIL/uL — ABNORMAL LOW (ref 3.87–5.11)
RDW: 15.2 % (ref 11.5–15.5)
WBC Count: 5.4 10*3/uL (ref 4.0–10.5)
nRBC: 0 % (ref 0.0–0.2)

## 2019-01-03 MED ORDER — HEPARIN SOD (PORK) LOCK FLUSH 100 UNIT/ML IV SOLN
500.0000 [IU] | Freq: Once | INTRAVENOUS | Status: AC | PRN
  Administered 2019-01-03: 500 [IU]
  Filled 2019-01-03: qty 5

## 2019-01-03 MED ORDER — SODIUM CHLORIDE 0.9% FLUSH
10.0000 mL | Freq: Once | INTRAVENOUS | Status: AC
  Administered 2019-01-03: 10 mL
  Filled 2019-01-03: qty 10

## 2019-01-03 MED ORDER — DIPHENHYDRAMINE HCL 25 MG PO CAPS
ORAL_CAPSULE | ORAL | Status: AC
  Filled 2019-01-03: qty 1

## 2019-01-03 MED ORDER — SODIUM CHLORIDE 0.9% FLUSH
10.0000 mL | INTRAVENOUS | Status: DC | PRN
  Administered 2019-01-03: 10 mL
  Filled 2019-01-03: qty 10

## 2019-01-03 MED ORDER — DIPHENHYDRAMINE HCL 50 MG/ML IJ SOLN
25.0000 mg | Freq: Once | INTRAMUSCULAR | Status: AC
  Administered 2019-01-03: 25 mg via INTRAVENOUS

## 2019-01-03 MED ORDER — ALTEPLASE 2 MG IJ SOLR
INTRAMUSCULAR | Status: AC
  Filled 2019-01-03: qty 2

## 2019-01-03 MED ORDER — TRASTUZUMAB CHEMO 150 MG IV SOLR
2.0000 mg/kg | Freq: Once | INTRAVENOUS | Status: AC
  Administered 2019-01-03: 168 mg via INTRAVENOUS
  Filled 2019-01-03: qty 8

## 2019-01-03 MED ORDER — ONDANSETRON HCL 4 MG/2ML IJ SOLN
INTRAMUSCULAR | Status: AC
  Filled 2019-01-03: qty 4

## 2019-01-03 MED ORDER — DIPHENHYDRAMINE HCL 50 MG/ML IJ SOLN
INTRAMUSCULAR | Status: AC
  Filled 2019-01-03: qty 1

## 2019-01-03 MED ORDER — PACLITAXEL PROTEIN-BOUND CHEMO INJECTION 100 MG
80.0000 mg/m2 | Freq: Once | INTRAVENOUS | Status: AC
  Administered 2019-01-03: 150 mg via INTRAVENOUS
  Filled 2019-01-03: qty 30

## 2019-01-03 MED ORDER — ALTEPLASE 2 MG IJ SOLR
2.0000 mg | Freq: Once | INTRAMUSCULAR | Status: AC
  Administered 2019-01-03: 2 mg
  Filled 2019-01-03: qty 2

## 2019-01-03 MED ORDER — ONDANSETRON HCL 4 MG/2ML IJ SOLN
8.0000 mg | Freq: Once | INTRAMUSCULAR | Status: AC
  Administered 2019-01-03: 8 mg via INTRAVENOUS

## 2019-01-03 MED ORDER — SODIUM CHLORIDE 0.9 % IV SOLN
Freq: Once | INTRAVENOUS | Status: AC
  Administered 2019-01-03: 12:00:00 via INTRAVENOUS
  Filled 2019-01-03: qty 250

## 2019-01-03 NOTE — Patient Instructions (Signed)
[facility] Discharge Instructions for Patients Receiving Chemotherapy  Today you received the following chemotherapy agents Trastuzumab (HERCEPTIN) & Paclitaxel-Protein Bound (ABRAXANE).  To help prevent nausea and vomiting after your treatment, we encourage you to take your nausea medication as prescribed.   If you develop nausea and vomiting that is not controlled by your nausea medication, call the clinic.   BELOW ARE SYMPTOMS THAT SHOULD BE REPORTED IMMEDIATELY:  *FEVER GREATER THAN 100.5 F  *CHILLS WITH OR WITHOUT FEVER  NAUSEA AND VOMITING THAT IS NOT CONTROLLED WITH YOUR NAUSEA MEDICATION  *UNUSUAL SHORTNESS OF BREATH  *UNUSUAL BRUISING OR BLEEDING  TENDERNESS IN MOUTH AND THROAT WITH OR WITHOUT PRESENCE OF ULCERS  *URINARY PROBLEMS  *BOWEL PROBLEMS  UNUSUAL RASH Items with * indicate a potential emergency and should be followed up as soon as possible.  Feel free to call the clinic should you have any questions or concerns. The clinic phone number is [phone].  Please show the [facility] at check-in to the Emergency Department and triage nurse.

## 2019-01-09 ENCOUNTER — Telehealth: Payer: Self-pay | Admitting: Genetic Counselor

## 2019-01-09 ENCOUNTER — Ambulatory Visit: Payer: Self-pay | Admitting: Genetic Counselor

## 2019-01-09 ENCOUNTER — Encounter: Payer: Self-pay | Admitting: Genetic Counselor

## 2019-01-09 DIAGNOSIS — Z1379 Encounter for other screening for genetic and chromosomal anomalies: Secondary | ICD-10-CM

## 2019-01-09 NOTE — Progress Notes (Signed)
HPI:  Judith Thomas was previously seen in the [facility] clinic due to a personal and family history of cancer and concerns regarding a hereditary predisposition to cancer. Please refer to our prior cancer genetics clinic note for more information regarding our discussion, assessment and recommendations, at the time. Judith Thomas recent genetic test results were disclosed to her, as were recommendations warranted by these results. These results and recommendations are discussed in more detail below.  CANCER HISTORY:    Malignant neoplasm of upper-inner quadrant of right breast in female, estrogen receptor positive ([place])   09/26/2018 Initial Diagnosis    Two right breast masses 12:00 to 1 o'clock position 1.3 cm and 1.2 cm, they are 1.5 cm apart.  Biopsy revealed grade 2 invasive ductal carcinoma ER 70%- 100%, PR 0% -20%, Ki-67 20%, HER-2 3+ by IHC and FISH ratio 2.15 with a gene copy number of 4.2 for the tumor that was 2+ by IHC, T1c N0 stage Ia    10/24/2018 Cancer Staging    Staging form: Breast, AJCC 8th Edition - Clinical stage from 10/24/2018: Stage IA (cT1c(2), cN0, cM0, G2, ER+, PR+, HER2+) - Signed by Nicholas Lose, MD on 10/24/2018    11/08/2018 -  Neo-Adjuvant Chemotherapy    Neoadjuvant chemotherapy with Taxol Herceptin weekly x12 followed by Herceptin maintenance vs Kadcyla for 1 year    01/09/2019 Genetic Testing    Negative genetic testing on the common hereditary cancer panel. The Hereditary Gene Panel offered by [facility] includes sequencing and/or deletion duplication testing of the following 48 genes: APC, ATM, AXIN2, BARD1, BMPR1A, BRCA1, BRCA2, BRIP1, CDH1, CDK4, CDKN2A (p14ARF), CDKN2A (p16INK4a), CHEK2, CTNNA1, DICER1, EPCAM (Deletion/duplication testing only), GREM1 (promoter region deletion/duplication testing only), KIT, MEN1, MLH1, MSH2, MSH3, MSH6, MUTYH, NBN, NF1, NHTL1, PALB2, PDGFRA, PMS2, POLD1, POLE, PTEN, RAD50, RAD51C, RAD51D, RNF43, SDHB, SDHC, SDHD, SMAD4,  SMARCA4. STK11, TP53, TSC1, TSC2, and VHL.  The following genes were evaluated for sequence changes only: SDHA and HOXB13 c.251G>A variant only. The report date is January 09, 2019.     FAMILY HISTORY:  We obtained a detailed, 4-generation family history.  Significant diagnoses are listed below: Family History  Problem Relation Age of Onset  . Liver disease Mother   . Heart disease Mother        afib  . Heart disease Father   . Asthma Father   . Diabetes Father   . Bladder Cancer Father        smoker  . Breast cancer Maternal Aunt   . Heart disease Maternal Grandfather   . Kidney disease Paternal Grandmother   . Heart disease Paternal Grandfather   . Heart disease Other   . Breast cancer Maternal Aunt        mets to bone   The patient has two children who are cancer free.  She has a sister who does not have cancer.  Both parents are deceased.  The patient's mother died of heart failure.  She had seven sisters and a brother.  Three sisters had breast cancer.  The maternal grandparent are deceased from non cancer related issues.  The patient's father is deceased from bladder cancer and heart failure.  He was a smoker.  He had one maternal half sister, and four paternal half sisters and seven paternal half brothers.  None reportedly had cancer.  Judith Thomas is unaware of previous family history of genetic testing for hereditary cancer risks. Patient's maternal ancestors are of [place] descent, and paternal  ancestors are of [place] descent. There is no reported Ashkenazi Jewish ancestry. There is no known consanguinity.   GENETIC TEST RESULTS: Genetic testing reported out on January 09, 2019 through the common hereditary cancer panel found no pathogenic mutations. The Hereditary Gene Panel offered by [facility] includes sequencing and/or deletion duplication testing of the following 48 genes: APC, ATM, AXIN2, BARD1, BMPR1A, BRCA1, BRCA2, BRIP1, CDH1, CDK4, CDKN2A (p14ARF), CDKN2A (p16INK4a),  CHEK2, CTNNA1, DICER1, EPCAM (Deletion/duplication testing only), GREM1 (promoter region deletion/duplication testing only), KIT, MEN1, MLH1, MSH2, MSH3, MSH6, MUTYH, NBN, NF1, NHTL1, PALB2, PDGFRA, PMS2, POLD1, POLE, PTEN, RAD50, RAD51C, RAD51D, RNF43, SDHB, SDHC, SDHD, SMAD4, SMARCA4. STK11, TP53, TSC1, TSC2, and VHL.  The following genes were evaluated for sequence changes only: SDHA and HOXB13 c.251G>A variant only. The test report has been scanned into EPIC and is located under the Molecular Pathology section of the Results Review tab.  A portion of the result report is included below for reference.     We discussed with Judith Thomas that because current genetic testing is not perfect, it is possible there may be a gene mutation in one of these genes that current testing cannot detect, but that chance is small.  We also discussed, that there could be another gene that has not yet been discovered, or that we have not yet tested, that is responsible for the cancer diagnoses in the family. It is also possible there is a hereditary cause for the cancer in the family that Judith Thomas did not inherit and therefore was not identified in her testing.  Therefore, it is important to remain in touch with cancer genetics in the future so that we can continue to offer Judith Thomas the most up to date genetic testing.   ADDITIONAL GENETIC TESTING: We discussed with Judith Thomas that there are other genes that are associated with increased cancer risk that can be analyzed. Should Judith Thomas wish to pursue additional genetic testing, we are happy to discuss and coordinate this testing, at any time.    CANCER SCREENING RECOMMENDATIONS: Judith Thomas test result is considered negative (normal).  This means that we have not identified a hereditary cause for her personal and family history of cancer at this time. Most cancers happen by chance and this negative test suggests that her cancer may fall into this category.    While  reassuring, this does not definitively rule out a hereditary predisposition to cancer. It is still possible that there could be genetic mutations that are undetectable by current technology. There could be genetic mutations in genes that have not been tested or identified to increase cancer risk.  Therefore, it is recommended she continue to follow the cancer management and screening guidelines provided by her oncology and primary healthcare provider.   An individual's cancer risk and medical management are not determined by genetic test results alone. Overall cancer risk assessment incorporates additional factors, including personal medical history, family history, and any available genetic information that may result in a personalized plan for cancer prevention and surveillance  RECOMMENDATIONS FOR FAMILY MEMBERS:  Individuals in this family might be at some increased risk of developing cancer, over the general population risk, simply due to the family history of cancer.  We recommended women in this family have a yearly mammogram beginning at age 51, or 26 years younger than the earliest onset of cancer, an annual clinical breast exam, and perform monthly breast self-exams. Women in this family should also have a gynecological exam as  recommended by their primary provider. All family members should have a colonoscopy by age 43.  FOLLOW-UP: Lastly, we discussed with Judith Thomas that cancer genetics is a rapidly advancing field and it is possible that new genetic tests will be appropriate for her and/or her family members in the future. We encouraged her to remain in contact with cancer genetics on an annual basis so we can update her personal and family histories and let her know of advances in cancer genetics that may benefit this family.   Our contact number was provided. Judith Thomas questions were answered to her satisfaction, and she knows she is welcome to call us at anytime with additional questions or  concerns.   Roma Kayser, MS, [facility] Certified Genetic Counselor [email or web address]

## 2019-01-09 NOTE — Progress Notes (Signed)
Patient Care Team: Chevis Pretty, FNP as PCP - General (Nurse Practitioner)  DIAGNOSIS:    ICD-10-CM   1. Malignant neoplasm of upper-inner quadrant of right breast in female, estrogen receptor positive ([place]) C50.211    Z17.0     SUMMARY OF ONCOLOGIC HISTORY:   Malignant neoplasm of upper-inner quadrant of right breast in female, estrogen receptor positive ([place])   09/26/2018 Initial Diagnosis    Two right breast masses 12:00 to 1 o'clock position 1.3 cm and 1.2 cm, they are 1.5 cm apart.  Biopsy revealed grade 2 invasive ductal carcinoma ER 70%- 100%, PR 0% -20%, Ki-67 20%, HER-2 3+ by IHC and FISH ratio 2.15 with a gene copy number of 4.2 for the tumor that was 2+ by IHC, T1c N0 stage Ia    10/24/2018 Cancer Staging    Staging form: Breast, AJCC 8th Edition - Clinical stage from 10/24/2018: Stage IA (cT1c(2), cN0, cM0, G2, ER+, PR+, HER2+) - Signed by Nicholas Lose, MD on 10/24/2018    11/08/2018 -  Neo-Adjuvant Chemotherapy    Neoadjuvant chemotherapy with Taxol Herceptin weekly x12 followed by Herceptin maintenance vs Kadcyla for 1 year    01/09/2019 Genetic Testing    Negative genetic testing on the common hereditary cancer panel. The Hereditary Gene Panel offered by [facility] includes sequencing and/or deletion duplication testing of the following 48 genes: APC, ATM, AXIN2, BARD1, BMPR1A, BRCA1, BRCA2, BRIP1, CDH1, CDK4, CDKN2A (p14ARF), CDKN2A (p16INK4a), CHEK2, CTNNA1, DICER1, EPCAM (Deletion/duplication testing only), GREM1 (promoter region deletion/duplication testing only), KIT, MEN1, MLH1, MSH2, MSH3, MSH6, MUTYH, NBN, NF1, NHTL1, PALB2, PDGFRA, PMS2, POLD1, POLE, PTEN, RAD50, RAD51C, RAD51D, RNF43, SDHB, SDHC, SDHD, SMAD4, SMARCA4. STK11, TP53, TSC1, TSC2, and VHL.  The following genes were evaluated for sequence changes only: SDHA and HOXB13 c.251G>A variant only. The report date is January 09, 2019.     CHIEF COMPLIANT: Cycle 10 Abraxane and Herceptin  INTERVAL HISTORY:  Judith Thomas is a 63 y.o. with above-mentioned history of right breast cancer who iscurrently onneoadjuvant chemotherapy with weeklyAbraxaneand Herceptin.She is a participant in the [employer] clinical trial.She presents to the clinic today for cycle 10.   REVIEW OF SYSTEMS:   Constitutional: Denies fevers, chills or abnormal weight loss Eyes: Denies blurriness of vision Ears, nose, mouth, throat, and face: Denies mucositis or sore throat Respiratory: Denies cough, dyspnea or wheezes Cardiovascular: Denies palpitation, chest discomfort Gastrointestinal: Denies nausea, heartburn or change in bowel habits Skin: Denies abnormal skin rashes Lymphatics: Denies new lymphadenopathy or easy bruising Neurological: Denies numbness, tingling or new weaknesses Behavioral/Psych: Mood is stable, no new changes  Extremities: No lower extremity edema Breast: denies any pain or lumps or nodules in either breasts All other systems were reviewed with the patient and are negative.  I have reviewed the past medical history, past surgical history, social history and family history with the patient and they are unchanged from previous note.  ALLERGIES:  is allergic to paclitaxel.  MEDICATIONS:  Current Outpatient Medications  Medication Sig Dispense Refill  . lidocaine-prilocaine (EMLA) cream Apply to affected area once 30 g 3  . Multiple Vitamin (MULTIVITAMIN) tablet Take 1 tablet by mouth daily.     . ondansetron (ZOFRAN) 8 MG tablet Take 1 tablet (8 mg total) by mouth 2 (two) times daily as needed (Nausea or vomiting). 30 tablet 1  . prochlorperazine (COMPAZINE) 10 MG tablet Take 1 tablet (10 mg total) by mouth every 6 (six) hours as needed (Nausea or vomiting). 30 tablet 1  No current facility-administered medications for this visit.     PHYSICAL EXAMINATION: ECOG PERFORMANCE STATUS: 1 - Symptomatic but completely ambulatory  There were no vitals filed for this visit. There were no vitals  filed for this visit.  GENERAL: alert, no distress and comfortable SKIN: skin color, texture, turgor are normal, no rashes or significant lesions EYES: normal, Conjunctiva are pink and non-injected, sclera clear OROPHARYNX: no exudate, no erythema and lips, buccal mucosa, and tongue normal  NECK: supple, thyroid normal size, non-tender, without nodularity LYMPH: no palpable lymphadenopathy in the cervical, axillary or inguinal LUNGS: clear to auscultation and percussion with normal breathing effort HEART: regular rate & rhythm and no murmurs and no lower extremity edema ABDOMEN: abdomen soft, non-tender and normal bowel sounds MUSCULOSKELETAL: no cyanosis of digits and no clubbing  NEURO: alert & oriented x 3 with fluent speech, no focal motor/sensory deficits EXTREMITIES: No lower extremity edema  LABORATORY DATA:  I have reviewed the data as listed CMP Latest Ref Rng & Units 01/10/2019 01/03/2019 12/27/2018  Glucose 70 - 99 mg/dL 108(H) 85 91  BUN 8 - 23 mg/dL _0 Creatinine 0.44 - 1.00 mg/dL 0.75 0.72 0.73  Sodium 135 - 145 mmol/L 140 142 140  Potassium 3.5 - 5.1 mmol/L 4.7 4.2 4.5  Chloride 98 - 111 mmol/L 107 107 107  CO2 22 - 32 mmol/L _1 Calcium 8.9 - 10.3 mg/dL 9.0 8.8(L) 8.8(L)  Total Protein 6.5 - 8.1 g/dL 7.0 7.1 6.9  Total Bilirubin 0.3 - 1.2 mg/dL 0.6 0.4 0.6  Alkaline Phos 38 - 126 U/L 108 107 110  AST 15 - 41 U/L 14(L) 15 14(L)  ALT 0 - 44 U/L _2 Lab Results  Component Value Date   WBC 5.8 01/10/2019   HGB 10.5 (L) 01/10/2019   HCT 32.7 (L) 01/10/2019   MCV 100.0 01/10/2019   PLT 320 01/10/2019   NEUTROABS 4.0 01/10/2019    ASSESSMENT & PLAN:  Malignant neoplasm of upper-inner quadrant of right breast in female, estrogen receptor positive ([place]) 09/27/19:Two right breast masses 12:00 to 1 o'clock position 1.3 cm and 1.2 cm, they are 1.5 cm apart. Biopsy revealed grade 2 invasive ductal carcinoma ER 70%- 100%, PR 0% -20%, Ki-67 20%, HER-2  3+ by IHC and FISH ratio 2.15 with a gene copy number of 4.2 for the tumor that was 2+ by IHC, T1c N0 stage Ia  Treatment plan: 1. Neoadjuvant chemotherapy withTaxol Herceptin weekly x66fllowed by Herceptin maintenance vsKadcylafor 1 year 2. Followed by breast conserving surgery if possible with sentinel lymph node study 3. Followed by adjuvant radiation therapy if patient had lumpectomy 4.Followed by antiestrogen therapy ------------------------------------------------------------------------------------------------------------------------------------------------------------ Current treatment: Cycle10AbraxaneHerceptin Echocardiogram 10/29/2018: EF 60 to 65%  Chemo toxicities: Facial warmth during infusions:switched to Abraxanewith cycle 5, still slight redness Chemotherapy-induced anemia: Monitoring closely Nausea: Resolved  Return to clinic in 2 weeks for cycle 12. We will plan for breast MRI and tumor board presentation after.  We also discussed about postponing the breast MRI until the end of April and continuing the Herceptin maintenance.    No orders of the defined types were placed in this encounter.  The patient has a good understanding of the overall plan. she agrees with it. she will call with any problems that may develop before the next visit here.  GNicholas Lose MD 01/10/2019  IJulious OkaDorshimer am acting as scribe for Dr. VNicholas Lose  I have reviewed the above  documentation for accuracy and completeness, and I agree with the above.

## 2019-01-09 NOTE — Telephone Encounter (Signed)
Revealed negative genetic testing.  Discussed that we do not know why she has breast cancer or why there is cancer in the family. It could be due to a different gene that we are not testing, or maybe our current technology may not be able to pick something up.  It will be important for her to keep in contact with genetics to keep up with whether additional testing may be needed. 

## 2019-01-10 ENCOUNTER — Other Ambulatory Visit: Payer: Self-pay

## 2019-01-10 ENCOUNTER — Inpatient Hospital Stay: Payer: BLUE CROSS/BLUE SHIELD | Attending: Hematology and Oncology

## 2019-01-10 ENCOUNTER — Inpatient Hospital Stay: Payer: BLUE CROSS/BLUE SHIELD

## 2019-01-10 ENCOUNTER — Inpatient Hospital Stay (HOSPITAL_BASED_OUTPATIENT_CLINIC_OR_DEPARTMENT_OTHER): Payer: BLUE CROSS/BLUE SHIELD | Admitting: Hematology and Oncology

## 2019-01-10 DIAGNOSIS — C50211 Malignant neoplasm of upper-inner quadrant of right female breast: Secondary | ICD-10-CM

## 2019-01-10 DIAGNOSIS — Z5111 Encounter for antineoplastic chemotherapy: Secondary | ICD-10-CM | POA: Diagnosis not present

## 2019-01-10 DIAGNOSIS — D6481 Anemia due to antineoplastic chemotherapy: Secondary | ICD-10-CM | POA: Diagnosis not present

## 2019-01-10 DIAGNOSIS — Z170 Estrogen receptor positive status [ER+]: Secondary | ICD-10-CM

## 2019-01-10 DIAGNOSIS — Z95828 Presence of other vascular implants and grafts: Secondary | ICD-10-CM

## 2019-01-10 DIAGNOSIS — Z5112 Encounter for antineoplastic immunotherapy: Secondary | ICD-10-CM | POA: Insufficient documentation

## 2019-01-10 LAB — CBC WITH DIFFERENTIAL (CANCER CENTER ONLY)
Abs Immature Granulocytes: 0.04 10*3/uL (ref 0.00–0.07)
Basophils Absolute: 0 10*3/uL (ref 0.0–0.1)
Basophils Relative: 1 %
Eosinophils Absolute: 0.1 10*3/uL (ref 0.0–0.5)
Eosinophils Relative: 2 %
HCT: 32.7 % — ABNORMAL LOW (ref 36.0–46.0)
Hemoglobin: 10.5 g/dL — ABNORMAL LOW (ref 12.0–15.0)
Immature Granulocytes: 1 %
Lymphocytes Relative: 22 %
Lymphs Abs: 1.3 10*3/uL (ref 0.7–4.0)
MCH: 32.1 pg (ref 26.0–34.0)
MCHC: 32.1 g/dL (ref 30.0–36.0)
MCV: 100 fL (ref 80.0–100.0)
Monocytes Absolute: 0.4 10*3/uL (ref 0.1–1.0)
Monocytes Relative: 7 %
Neutro Abs: 4 10*3/uL (ref 1.7–7.7)
Neutrophils Relative %: 67 %
Platelet Count: 320 10*3/uL (ref 150–400)
RBC: 3.27 MIL/uL — ABNORMAL LOW (ref 3.87–5.11)
RDW: 15.3 % (ref 11.5–15.5)
WBC Count: 5.8 10*3/uL (ref 4.0–10.5)
nRBC: 0 % (ref 0.0–0.2)

## 2019-01-10 LAB — CMP (CANCER CENTER ONLY)
ALT: 16 U/L (ref 0–44)
AST: 14 U/L — ABNORMAL LOW (ref 15–41)
Albumin: 3.9 g/dL (ref 3.5–5.0)
Alkaline Phosphatase: 108 U/L (ref 38–126)
Anion gap: 8 (ref 5–15)
BUN: 15 mg/dL (ref 8–23)
CO2: 25 mmol/L (ref 22–32)
Calcium: 9 mg/dL (ref 8.9–10.3)
Chloride: 107 mmol/L (ref 98–111)
Creatinine: 0.75 mg/dL (ref 0.44–1.00)
GFR, Est AFR Am: >60 mL/min (ref >60)
GFR, Estimated: >60 mL/min (ref >60)
Glucose, Bld: 108 mg/dL — ABNORMAL HIGH (ref 70–99)
Potassium: 4.7 mmol/L (ref 3.5–5.1)
Sodium: 140 mmol/L (ref 135–145)
Total Bilirubin: 0.6 mg/dL (ref 0.3–1.2)
Total Protein: 7 g/dL (ref 6.5–8.1)

## 2019-01-10 MED ORDER — SODIUM CHLORIDE 0.9% FLUSH
10.0000 mL | Freq: Once | INTRAVENOUS | Status: AC
  Administered 2019-01-10: 10 mL
  Filled 2019-01-10: qty 10

## 2019-01-10 MED ORDER — DIPHENHYDRAMINE HCL 50 MG/ML IJ SOLN
INTRAMUSCULAR | Status: AC
  Filled 2019-01-10: qty 1

## 2019-01-10 MED ORDER — TRASTUZUMAB CHEMO 150 MG IV SOLR
2.0000 mg/kg | Freq: Once | INTRAVENOUS | Status: AC
  Administered 2019-01-10: 168 mg via INTRAVENOUS
  Filled 2019-01-10: qty 8

## 2019-01-10 MED ORDER — SODIUM CHLORIDE 0.9% FLUSH
10.0000 mL | INTRAVENOUS | Status: DC | PRN
  Administered 2019-01-10: 10 mL
  Filled 2019-01-10: qty 10

## 2019-01-10 MED ORDER — SODIUM CHLORIDE 0.9 % IV SOLN
Freq: Once | INTRAVENOUS | Status: AC
  Administered 2019-01-10: 12:00:00 via INTRAVENOUS
  Filled 2019-01-10: qty 250

## 2019-01-10 MED ORDER — HEPARIN SOD (PORK) LOCK FLUSH 100 UNIT/ML IV SOLN
500.0000 [IU] | Freq: Once | INTRAVENOUS | Status: AC | PRN
  Administered 2019-01-10: 500 [IU]
  Filled 2019-01-10: qty 5

## 2019-01-10 MED ORDER — ONDANSETRON HCL 4 MG/2ML IJ SOLN
INTRAMUSCULAR | Status: AC
  Filled 2019-01-10: qty 4

## 2019-01-10 MED ORDER — PACLITAXEL PROTEIN-BOUND CHEMO INJECTION 100 MG
80.0000 mg/m2 | Freq: Once | INTRAVENOUS | Status: AC
  Administered 2019-01-10: 150 mg via INTRAVENOUS
  Filled 2019-01-10: qty 30

## 2019-01-10 MED ORDER — DIPHENHYDRAMINE HCL 50 MG/ML IJ SOLN
25.0000 mg | Freq: Once | INTRAMUSCULAR | Status: AC
  Administered 2019-01-10: 25 mg via INTRAVENOUS

## 2019-01-10 MED ORDER — ONDANSETRON HCL 4 MG/2ML IJ SOLN
8.0000 mg | Freq: Once | INTRAMUSCULAR | Status: AC
  Administered 2019-01-10: 8 mg via INTRAVENOUS

## 2019-01-10 NOTE — Patient Instructions (Addendum)
[facility] Discharge Instructions for Patients Receiving Chemotherapy  Today you received the following chemotherapy agents Trastuzumab (HERCEPTIN) & Paclitaxel-Protein Bound (ABRAXANE).  To help prevent nausea and vomiting after your treatment, we encourage you to take your nausea medication as prescribed.   If you develop nausea and vomiting that is not controlled by your nausea medication, call the clinic.   BELOW ARE SYMPTOMS THAT SHOULD BE REPORTED IMMEDIATELY:  *FEVER GREATER THAN 100.5 F  *CHILLS WITH OR WITHOUT FEVER  NAUSEA AND VOMITING THAT IS NOT CONTROLLED WITH YOUR NAUSEA MEDICATION  *UNUSUAL SHORTNESS OF BREATH  *UNUSUAL BRUISING OR BLEEDING  TENDERNESS IN MOUTH AND THROAT WITH OR WITHOUT PRESENCE OF ULCERS  *URINARY PROBLEMS  *BOWEL PROBLEMS  UNUSUAL RASH Items with * indicate a potential emergency and should be followed up as soon as possible.  Feel free to call the clinic should you have any questions or concerns. The clinic phone number is [phone].  Please show the [facility] at check-in to the Emergency Department and triage nurse.  Coronavirus (COVID-19) Are you at risk?  Are you at risk for the Coronavirus (COVID-19)?  To be considered HIGH RISK for Coronavirus (COVID-19), you have to meet the following criteria:  . Traveled to [place], [place], [place], [place] or [place]; or in the [place] to [place], [place], [place], or [place]; and have fever, cough, and shortness of breath within the last 2 weeks of travel OR . Been in close contact with a person diagnosed with COVID-19 within the last 2 weeks and have fever, cough, and shortness of breath . IF YOU DO NOT MEET THESE CRITERIA, YOU ARE CONSIDERED LOW RISK FOR COVID-19.  What to do if you are HIGH RISK for COVID-19?  Marland Kitchen If you are having a medical emergency, call 911. . Seek medical care right away. Before you go to a doctor's office, urgent care or  emergency department, call ahead and tell them about your recent travel, contact with someone diagnosed with COVID-19, and your symptoms. You should receive instructions from your physician's office regarding next steps of care.  . When you arrive at healthcare provider, tell the healthcare staff immediately you have returned from visiting [place], [place], [place], [place] or [place]; or traveled in the [place] to [place], [place], [place], or [place]; in the last two weeks or you have been in close contact with a person diagnosed with COVID-19 in the last 2 weeks.   . Tell the health care staff about your symptoms: fever, cough and shortness of breath. . After you have been seen by a medical provider, you will be either: o Tested for (COVID-19) and discharged home on quarantine except to seek medical care if symptoms worsen, and asked to  - Stay home and avoid contact with others until you get your results (4-5 days)  - Avoid travel on public transportation if possible (such as bus, train, or airplane) or o Sent to the Emergency Department by EMS for evaluation, COVID-19 testing, and possible admission depending on your condition and test results.  What to do if you are LOW RISK for COVID-19?  Reduce your risk of any infection by using the same precautions used for avoiding the common cold or flu:  Marland Kitchen Wash your hands often with soap and warm water for at least 20 seconds.  If soap and water are not readily available, use an alcohol-based hand sanitizer with at least 60% alcohol.  Marland Kitchen  If coughing or sneezing, cover your mouth and nose by coughing or sneezing into the elbow areas of your shirt or coat, into a tissue or into your sleeve (not your hands). . Avoid shaking hands with others and consider head nods or verbal greetings only. . Avoid touching your eyes, nose, or mouth with unwashed hands.  . Avoid close contact with people who are sick. . Avoid places or events with large numbers  of people in one location, like concerts or sporting events. . Carefully consider travel plans you have or are making. . If you are planning any travel outside or inside the [place], visit the CDC's Travelers' Health webpage for the latest health notices. . If you have some symptoms but not all symptoms, continue to monitor at home and seek medical attention if your symptoms worsen. . If you are having a medical emergency, call 911.   [place] / e-Visit: [email or web address]         [facility]: [facility]: [phone]                   [facility]: [phone]

## 2019-01-10 NOTE — Assessment & Plan Note (Signed)
09/27/19:Two right breast masses 12:00 to 1 o'clock position 1.3 cm and 1.2 cm, they are 1.5 cm apart. 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 Neoadjuvant chemotherapy withTaxol Herceptin weekly x8fllowed by Herceptin maintenance vsKadcylafor 1 year 2. Followed by breast conserving surgery if possible with sentinel lymph node study 3. Followed by adjuvant radiation therapy if patient had lumpectomy 4.Followed by antiestrogen therapy ------------------------------------------------------------------------------------------------------------------------------------------------------------ Current treatment: Cycle10AbraxaneHerceptin Echocardiogram 10/29/2018: EF 60 to 65%  Chemo toxicities: Facial warmth during infusions:switched to Abraxanewith cycle 5, still slight redness Chemotherapy-induced anemia: Monitoring closely Nausea: Resolved  Return to clinic in 2 weeks for cycle 12. We will plan for breast MRI and tumor board presentation after.  We also discussed about postponing the breast MRI until the end of April and continuing the Herceptin maintenance.

## 2019-01-14 ENCOUNTER — Encounter: Payer: Self-pay | Admitting: Hematology and Oncology

## 2019-01-14 ENCOUNTER — Other Ambulatory Visit (HOSPITAL_COMMUNITY): Payer: Self-pay | Admitting: *Deleted

## 2019-01-14 DIAGNOSIS — C50211 Malignant neoplasm of upper-inner quadrant of right female breast: Secondary | ICD-10-CM

## 2019-01-14 DIAGNOSIS — Z170 Estrogen receptor positive status [ER+]: Principal | ICD-10-CM

## 2019-01-15 ENCOUNTER — Encounter: Payer: Self-pay | Admitting: *Deleted

## 2019-01-15 NOTE — Assessment & Plan Note (Signed)
09/27/19:Two right breast masses 12:00 to 1 o'clock position 1.3 cm and 1.2 cm, they are 1.5 cm apart. 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 Neoadjuvant chemotherapy withTaxol Herceptin weekly x31fllowed by Herceptin maintenance vsKadcylafor 1 year 2. Followed by breast conserving surgery if possible with sentinel lymph node study 3. Followed by adjuvant radiation therapy if patient had lumpectomy 4.Followed by antiestrogen therapy ------------------------------------------------------------------------------------------------------------------------------------------------------------ Current treatment: Cycle12AbraxaneHerceptin Echocardiogram 10/29/2018: EF 60 to 65%  Chemo toxicities: Facial warmth during infusions:switched to Abraxanewith cycle 5, still slight redness Chemotherapy-induced anemia: Monitoring closely Nausea: Resolved  We will plan for breast MRI and tumor board presentation after.   We will maintain her on Herceptin maintenance until surgery can happen once every 3 weeks.

## 2019-01-16 ENCOUNTER — Telehealth (HOSPITAL_COMMUNITY): Payer: Self-pay | Admitting: Internal Medicine

## 2019-01-16 NOTE — Telephone Encounter (Signed)
Patient was scheduled for Echo @[facility]  on 01/28/2019 and telephone visit with Dr. Haroldine Laws on 01/30/2019.  Pt called today requesting to reschedule both appts until June, 2020.  Pt stated she wanted to schedule her appts for the same day, getting Echo done @[facility]  prior to appt with Dr. Haroldine Laws.  Pt was rescheduled to 03/27/2019 for both appts per her request>

## 2019-01-17 ENCOUNTER — Inpatient Hospital Stay: Payer: BLUE CROSS/BLUE SHIELD

## 2019-01-17 ENCOUNTER — Other Ambulatory Visit: Payer: Self-pay

## 2019-01-17 VITALS — BP 132/87 | HR 77 | Temp 98.0°F | Resp 16 | Wt 185.5 lb

## 2019-01-17 DIAGNOSIS — Z5111 Encounter for antineoplastic chemotherapy: Secondary | ICD-10-CM | POA: Diagnosis not present

## 2019-01-17 DIAGNOSIS — Z95828 Presence of other vascular implants and grafts: Secondary | ICD-10-CM

## 2019-01-17 DIAGNOSIS — Z5112 Encounter for antineoplastic immunotherapy: Secondary | ICD-10-CM | POA: Diagnosis not present

## 2019-01-17 DIAGNOSIS — C50211 Malignant neoplasm of upper-inner quadrant of right female breast: Secondary | ICD-10-CM | POA: Diagnosis not present

## 2019-01-17 DIAGNOSIS — Z170 Estrogen receptor positive status [ER+]: Principal | ICD-10-CM

## 2019-01-17 LAB — CMP (CANCER CENTER ONLY)
ALT: 12 U/L (ref 0–44)
AST: 13 U/L — ABNORMAL LOW (ref 15–41)
Albumin: 3.8 g/dL (ref 3.5–5.0)
Alkaline Phosphatase: 99 U/L (ref 38–126)
Anion gap: 9 (ref 5–15)
BUN: 14 mg/dL (ref 8–23)
CO2: 25 mmol/L (ref 22–32)
Calcium: 8.8 mg/dL — ABNORMAL LOW (ref 8.9–10.3)
Chloride: 106 mmol/L (ref 98–111)
Creatinine: 0.74 mg/dL (ref 0.44–1.00)
GFR, Est AFR Am: >60 mL/min (ref >60)
GFR, Estimated: >60 mL/min (ref >60)
Glucose, Bld: 91 mg/dL (ref 70–99)
Potassium: 4.6 mmol/L (ref 3.5–5.1)
Sodium: 140 mmol/L (ref 135–145)
Total Bilirubin: 0.5 mg/dL (ref 0.3–1.2)
Total Protein: 6.8 g/dL (ref 6.5–8.1)

## 2019-01-17 LAB — CBC WITH DIFFERENTIAL (CANCER CENTER ONLY)
Abs Immature Granulocytes: 0.02 10*3/uL (ref 0.00–0.07)
Basophils Absolute: 0.1 10*3/uL (ref 0.0–0.1)
Basophils Relative: 1 %
Eosinophils Absolute: 0.1 10*3/uL (ref 0.0–0.5)
Eosinophils Relative: 2 %
HCT: 32.6 % — ABNORMAL LOW (ref 36.0–46.0)
Hemoglobin: 10.6 g/dL — ABNORMAL LOW (ref 12.0–15.0)
Immature Granulocytes: 0 %
Lymphocytes Relative: 26 %
Lymphs Abs: 1.3 10*3/uL (ref 0.7–4.0)
MCH: 33.1 pg (ref 26.0–34.0)
MCHC: 32.5 g/dL (ref 30.0–36.0)
MCV: 101.9 fL — ABNORMAL HIGH (ref 80.0–100.0)
Monocytes Absolute: 0.3 10*3/uL (ref 0.1–1.0)
Monocytes Relative: 7 %
Neutro Abs: 3 10*3/uL (ref 1.7–7.7)
Neutrophils Relative %: 64 %
Platelet Count: 332 10*3/uL (ref 150–400)
RBC: 3.2 MIL/uL — ABNORMAL LOW (ref 3.87–5.11)
RDW: 15.3 % (ref 11.5–15.5)
WBC Count: 4.8 10*3/uL (ref 4.0–10.5)
nRBC: 0 % (ref 0.0–0.2)

## 2019-01-17 MED ORDER — TRASTUZUMAB CHEMO 150 MG IV SOLR
2.0000 mg/kg | Freq: Once | INTRAVENOUS | Status: AC
  Administered 2019-01-17: 168 mg via INTRAVENOUS
  Filled 2019-01-17: qty 8

## 2019-01-17 MED ORDER — SODIUM CHLORIDE 0.9% FLUSH
10.0000 mL | Freq: Once | INTRAVENOUS | Status: AC
  Administered 2019-01-17: 10 mL
  Filled 2019-01-17: qty 10

## 2019-01-17 MED ORDER — HEPARIN SOD (PORK) LOCK FLUSH 100 UNIT/ML IV SOLN
500.0000 [IU] | Freq: Once | INTRAVENOUS | Status: AC | PRN
  Administered 2019-01-17: 500 [IU]
  Filled 2019-01-17: qty 5

## 2019-01-17 MED ORDER — ONDANSETRON HCL 4 MG/2ML IJ SOLN
8.0000 mg | Freq: Once | INTRAMUSCULAR | Status: AC
  Administered 2019-01-17: 8 mg via INTRAVENOUS

## 2019-01-17 MED ORDER — DIPHENHYDRAMINE HCL 50 MG/ML IJ SOLN
INTRAMUSCULAR | Status: AC
  Filled 2019-01-17: qty 1

## 2019-01-17 MED ORDER — SODIUM CHLORIDE 0.9% FLUSH
10.0000 mL | INTRAVENOUS | Status: DC | PRN
  Administered 2019-01-17: 10 mL
  Filled 2019-01-17: qty 10

## 2019-01-17 MED ORDER — PACLITAXEL PROTEIN-BOUND CHEMO INJECTION 100 MG
80.0000 mg/m2 | Freq: Once | INTRAVENOUS | Status: AC
  Administered 2019-01-17: 150 mg via INTRAVENOUS
  Filled 2019-01-17: qty 30

## 2019-01-17 MED ORDER — ONDANSETRON HCL 4 MG/2ML IJ SOLN
INTRAMUSCULAR | Status: AC
  Filled 2019-01-17: qty 4

## 2019-01-17 MED ORDER — DIPHENHYDRAMINE HCL 50 MG/ML IJ SOLN
25.0000 mg | Freq: Once | INTRAMUSCULAR | Status: AC
  Administered 2019-01-17: 25 mg via INTRAVENOUS

## 2019-01-17 MED ORDER — SODIUM CHLORIDE 0.9 % IV SOLN
Freq: Once | INTRAVENOUS | Status: AC
  Administered 2019-01-17: 13:00:00 via INTRAVENOUS
  Filled 2019-01-17: qty 250

## 2019-01-17 NOTE — Patient Instructions (Signed)
[facility] Discharge Instructions for Patients Receiving Chemotherapy  Today you received the following chemotherapy agents Trastuzumab (HERCEPTIN) & Paclitaxel-Protein Bound (ABRAXANE).  To help prevent nausea and vomiting after your treatment, we encourage you to take your nausea medication as prescribed.   If you develop nausea and vomiting that is not controlled by your nausea medication, call the clinic.   BELOW ARE SYMPTOMS THAT SHOULD BE REPORTED IMMEDIATELY:  *FEVER GREATER THAN 100.5 F  *CHILLS WITH OR WITHOUT FEVER  NAUSEA AND VOMITING THAT IS NOT CONTROLLED WITH YOUR NAUSEA MEDICATION  *UNUSUAL SHORTNESS OF BREATH  *UNUSUAL BRUISING OR BLEEDING  TENDERNESS IN MOUTH AND THROAT WITH OR WITHOUT PRESENCE OF ULCERS  *URINARY PROBLEMS  *BOWEL PROBLEMS  UNUSUAL RASH Items with * indicate a potential emergency and should be followed up as soon as possible.  Feel free to call the clinic should you have any questions or concerns. The clinic phone number is [phone].  Please show the [facility] at check-in to the Emergency Department and triage nurse.  Coronavirus (COVID-19) Are you at risk?  Are you at risk for the Coronavirus (COVID-19)?  To be considered HIGH RISK for Coronavirus (COVID-19), you have to meet the following criteria:  . Traveled to [place], [place], [place], [place] or [place]; or in the [place] to [place], [place], [place], or [place]; and have fever, cough, and shortness of breath within the last 2 weeks of travel OR . Been in close contact with a person diagnosed with COVID-19 within the last 2 weeks and have fever, cough, and shortness of breath . IF YOU DO NOT MEET THESE CRITERIA, YOU ARE CONSIDERED LOW RISK FOR COVID-19.  What to do if you are HIGH RISK for COVID-19?  Marland Kitchen If you are having a medical emergency, call 911. . Seek medical care right away. Before you go to a doctor's office, urgent care or  emergency department, call ahead and tell them about your recent travel, contact with someone diagnosed with COVID-19, and your symptoms. You should receive instructions from your physician's office regarding next steps of care.  . When you arrive at healthcare provider, tell the healthcare staff immediately you have returned from visiting [place], [place], [place], [place] or [place]; or traveled in the [place] to [place], [place], [place], or [place]; in the last two weeks or you have been in close contact with a person diagnosed with COVID-19 in the last 2 weeks.   . Tell the health care staff about your symptoms: fever, cough and shortness of breath. . After you have been seen by a medical provider, you will be either: o Tested for (COVID-19) and discharged home on quarantine except to seek medical care if symptoms worsen, and asked to  - Stay home and avoid contact with others until you get your results (4-5 days)  - Avoid travel on public transportation if possible (such as bus, train, or airplane) or o Sent to the Emergency Department by EMS for evaluation, COVID-19 testing, and possible admission depending on your condition and test results.  What to do if you are LOW RISK for COVID-19?  Reduce your risk of any infection by using the same precautions used for avoiding the common cold or flu:  Marland Kitchen Wash your hands often with soap and warm water for at least 20 seconds.  If soap and water are not readily available, use an alcohol-based hand sanitizer with at least 60% alcohol.  Marland Kitchen  If coughing or sneezing, cover your mouth and nose by coughing or sneezing into the elbow areas of your shirt or coat, into a tissue or into your sleeve (not your hands). . Avoid shaking hands with others and consider head nods or verbal greetings only. . Avoid touching your eyes, nose, or mouth with unwashed hands.  . Avoid close contact with people who are sick. . Avoid places or events with large numbers  of people in one location, like concerts or sporting events. . Carefully consider travel plans you have or are making. . If you are planning any travel outside or inside the [place], visit the CDC's Travelers' Health webpage for the latest health notices. . If you have some symptoms but not all symptoms, continue to monitor at home and seek medical attention if your symptoms worsen. . If you are having a medical emergency, call 911.   [place] / e-Visit: [email or web address]         [facility]: [facility]: [phone]                   [facility]: [phone]

## 2019-01-23 NOTE — Progress Notes (Signed)
Patient Care Team: Chevis Pretty, FNP as PCP - General (Nurse Practitioner)  DIAGNOSIS:    ICD-10-CM   1. Malignant neoplasm of upper-inner quadrant of right breast in female, estrogen receptor positive ([place]) C50.211    Z17.0     SUMMARY OF ONCOLOGIC HISTORY:   Malignant neoplasm of upper-inner quadrant of right breast in female, estrogen receptor positive ([place])   09/26/2018 Initial Diagnosis    Two right breast masses 12:00 to 1 o'clock position 1.3 cm and 1.2 cm, they are 1.5 cm apart.  Biopsy revealed grade 2 invasive ductal carcinoma ER 70%- 100%, PR 0% -20%, Ki-67 20%, HER-2 3+ by IHC and FISH ratio 2.15 with a gene copy number of 4.2 for the tumor that was 2+ by IHC, T1c N0 stage Ia    10/24/2018 Cancer Staging    Staging form: Breast, AJCC 8th Edition - Clinical stage from 10/24/2018: Stage IA (cT1c(2), cN0, cM0, G2, ER+, PR+, HER2+) - Signed by Nicholas Lose, MD on 10/24/2018    11/08/2018 -  Neo-Adjuvant Chemotherapy    Neoadjuvant chemotherapy with Taxol Herceptin weekly x12 followed by Herceptin maintenance vs Kadcyla for 1 year    01/09/2019 Genetic Testing    Negative genetic testing on the common hereditary cancer panel. The Hereditary Gene Panel offered by [facility] includes sequencing and/or deletion duplication testing of the following 48 genes: APC, ATM, AXIN2, BARD1, BMPR1A, BRCA1, BRCA2, BRIP1, CDH1, CDK4, CDKN2A (p14ARF), CDKN2A (p16INK4a), CHEK2, CTNNA1, DICER1, EPCAM (Deletion/duplication testing only), GREM1 (promoter region deletion/duplication testing only), KIT, MEN1, MLH1, MSH2, MSH3, MSH6, MUTYH, NBN, NF1, NHTL1, PALB2, PDGFRA, PMS2, POLD1, POLE, PTEN, RAD50, RAD51C, RAD51D, RNF43, SDHB, SDHC, SDHD, SMAD4, SMARCA4. STK11, TP53, TSC1, TSC2, and VHL.  The following genes were evaluated for sequence changes only: SDHA and HOXB13 c.251G>A variant only. The report date is January 09, 2019.     CHIEF COMPLIANT: Cycle 12 Abraxane and Herceptin  INTERVAL HISTORY:  Judith Thomas is a 63 y.o. with above-mentioned history of right breast cancer who iscurrently onneoadjuvantchemotherapy with weeklyAbraxaneand Herceptin.She presents to the clinic alone today for her final treatment.  She denies any peripheral neuropathy.  Denies any nausea or vomiting.  She does have mild fatigue.  She has lost Significant Amount of Hair but Still kept some of the Hair.  REVIEW OF SYSTEMS:   Constitutional: Denies fevers, chills or abnormal weight loss Eyes: Denies blurriness of vision Ears, nose, mouth, throat, and face: Denies mucositis or sore throat Respiratory: Denies cough, dyspnea or wheezes Cardiovascular: Denies palpitation, chest discomfort Gastrointestinal: Denies nausea, heartburn or change in bowel habits Skin: Denies abnormal skin rashes Lymphatics: Denies new lymphadenopathy or easy bruising Neurological: Denies numbness, tingling or new weaknesses Behavioral/Psych: Mood is stable, no new changes  Extremities: No lower extremity edema Breast: denies any pain or lumps or nodules in either breasts All other systems were reviewed with the patient and are negative.  I have reviewed the past medical history, past surgical history, social history and family history with the patient and they are unchanged from previous note.  ALLERGIES:  is allergic to paclitaxel.  MEDICATIONS:  Current Outpatient Medications  Medication Sig Dispense Refill  . lidocaine-prilocaine (EMLA) cream Apply to affected area once 30 g 3  . Multiple Vitamin (MULTIVITAMIN) tablet Take 1 tablet by mouth daily.     . ondansetron (ZOFRAN) 8 MG tablet Take 1 tablet (8 mg total) by mouth 2 (two) times daily as needed (Nausea or vomiting). 30 tablet 1  . prochlorperazine (  COMPAZINE) 10 MG tablet Take 1 tablet (10 mg total) by mouth every 6 (six) hours as needed (Nausea or vomiting). 30 tablet 1   No current facility-administered medications for this visit.    Facility-Administered  Medications Ordered in Other Visits  Medication Dose Route Frequency Provider Last Rate Last Dose  . heparin lock flush 100 unit/mL  500 Units Intracatheter Once PRN Nicholas Lose, MD      . PACLitaxel-protein bound (ABRAXANE) chemo infusion 150 mg  80 mg/m2 (Treatment Plan Recorded) Intravenous Once Nicholas Lose, MD      . sodium chloride flush (NS) 0.9 % injection 10 mL  10 mL Intracatheter PRN Nicholas Lose, MD      . trastuzumab (HERCEPTIN) 504 mg in sodium chloride 0.9 % 250 mL chemo infusion  6 mg/kg (Treatment Plan Recorded) Intravenous Once Nicholas Lose, MD        PHYSICAL EXAMINATION: ECOG PERFORMANCE STATUS: 1 - Symptomatic but completely ambulatory  Vitals:   01/24/19 1050  BP: 131/73  Pulse: 76  Resp: 18  Temp: 98.9 F (37.2 C)  SpO2: 100%   Filed Weights   01/24/19 1050  Weight: 183 lb 11.2 oz (83.3 kg)    GENERAL: alert, no distress and comfortable SKIN: skin color, texture, turgor are normal, no rashes or significant lesions EYES: normal, Conjunctiva are pink and non-injected, sclera clear OROPHARYNX: no exudate, no erythema and lips, buccal mucosa, and tongue normal  NECK: supple, thyroid normal size, non-tender, without nodularity LYMPH: no palpable lymphadenopathy in the cervical, axillary or inguinal LUNGS: clear to auscultation and percussion with normal breathing effort HEART: regular rate & rhythm and no murmurs and no lower extremity edema ABDOMEN: abdomen soft, non-tender and normal bowel sounds MUSCULOSKELETAL: no cyanosis of digits and no clubbing  NEURO: alert & oriented x 3 with fluent speech, no focal motor/sensory deficits EXTREMITIES: No lower extremity edema  LABORATORY DATA:  I have reviewed the data as listed CMP Latest Ref Rng & Units 01/24/2019 01/17/2019 01/10/2019  Glucose 70 - 99 mg/dL 102(H) 91 108(H)  BUN 8 - 23 mg/dL '12 14 15  '$ Creatinine 0.44 - 1.00 mg/dL 0.73 0.74 0.75  Sodium 135 - 145 mmol/L 140 140 140  Potassium 3.5 - 5.1 mmol/L  4.4 4.6 4.7  Chloride 98 - 111 mmol/L 107 106 107  CO2 22 - 32 mmol/L '23 25 25  '$ Calcium 8.9 - 10.3 mg/dL 8.5(L) 8.8(L) 9.0  Total Protein 6.5 - 8.1 g/dL 6.8 6.8 7.0  Total Bilirubin 0.3 - 1.2 mg/dL 0.5 0.5 0.6  Alkaline Phos 38 - 126 U/L 106 99 108  AST 15 - 41 U/L 16 13(L) 14(L)  ALT 0 - 44 U/L '15 12 16    '$ Lab Results  Component Value Date   WBC 5.1 01/24/2019   HGB 10.3 (L) 01/24/2019   HCT 32.1 (L) 01/24/2019   MCV 102.2 (H) 01/24/2019   PLT 337 01/24/2019   NEUTROABS 3.4 01/24/2019    ASSESSMENT & PLAN:  Malignant neoplasm of upper-inner quadrant of right breast in female, estrogen receptor positive ([place]) 09/27/19:Two right breast masses 12:00 to 1 o'clock position 1.3 cm and 1.2 cm, they are 1.5 cm apart. 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 Neoadjuvant chemotherapy withTaxol Herceptin weekly x75fllowed by Herceptin  maintenance vsKadcylafor 1 year 2. Followed by breast conserving surgery if possible with sentinel lymph node study 3. Followed by adjuvant radiation therapy if patient had lumpectomy 4.Followed by antiestrogen therapy ------------------------------------------------------------------------------------------------------------------------------------------------------------ Current treatment: Cycle12AbraxaneHerceptin Echocardiogram 10/29/2018: EF 60 to 65%  Chemo toxicities: Facial warmth during infusions:switched to Abraxanewith cycle 5, still slight redness Chemotherapy-induced anemia: Monitoring closely Nausea: Resolved  We will plan for breast MRI and tumor board presentation after.   We will maintain her on Herceptin maintenance until surgery can happen once every 3 weeks.  No orders of the defined types were placed in this encounter.  The patient has a  good understanding of the overall plan. she agrees with it. she will call with any problems that may develop before the next visit here.  Nicholas Lose, MD 01/24/2019  Julious Oka Dorshimer am acting as scribe for Dr. Nicholas Lose.  I have reviewed the above documentation for accuracy and completeness, and I agree with the above.

## 2019-01-24 ENCOUNTER — Inpatient Hospital Stay (HOSPITAL_BASED_OUTPATIENT_CLINIC_OR_DEPARTMENT_OTHER): Payer: BLUE CROSS/BLUE SHIELD | Admitting: Hematology and Oncology

## 2019-01-24 ENCOUNTER — Inpatient Hospital Stay: Payer: BLUE CROSS/BLUE SHIELD

## 2019-01-24 ENCOUNTER — Other Ambulatory Visit: Payer: Self-pay

## 2019-01-24 DIAGNOSIS — Z5111 Encounter for antineoplastic chemotherapy: Secondary | ICD-10-CM | POA: Diagnosis not present

## 2019-01-24 DIAGNOSIS — C50211 Malignant neoplasm of upper-inner quadrant of right female breast: Secondary | ICD-10-CM

## 2019-01-24 DIAGNOSIS — Z5112 Encounter for antineoplastic immunotherapy: Secondary | ICD-10-CM | POA: Diagnosis not present

## 2019-01-24 DIAGNOSIS — Z170 Estrogen receptor positive status [ER+]: Secondary | ICD-10-CM

## 2019-01-24 DIAGNOSIS — Z95828 Presence of other vascular implants and grafts: Secondary | ICD-10-CM

## 2019-01-24 DIAGNOSIS — L659 Nonscarring hair loss, unspecified: Secondary | ICD-10-CM | POA: Diagnosis not present

## 2019-01-24 DIAGNOSIS — D6481 Anemia due to antineoplastic chemotherapy: Secondary | ICD-10-CM | POA: Diagnosis not present

## 2019-01-24 DIAGNOSIS — R5383 Other fatigue: Secondary | ICD-10-CM

## 2019-01-24 LAB — CMP (CANCER CENTER ONLY)
ALT: 15 U/L (ref 0–44)
AST: 16 U/L (ref 15–41)
Albumin: 3.8 g/dL (ref 3.5–5.0)
Alkaline Phosphatase: 106 U/L (ref 38–126)
Anion gap: 10 (ref 5–15)
BUN: 12 mg/dL (ref 8–23)
CO2: 23 mmol/L (ref 22–32)
Calcium: 8.5 mg/dL — ABNORMAL LOW (ref 8.9–10.3)
Chloride: 107 mmol/L (ref 98–111)
Creatinine: 0.73 mg/dL (ref 0.44–1.00)
GFR, Est AFR Am: >60 mL/min (ref >60)
GFR, Estimated: >60 mL/min (ref >60)
Glucose, Bld: 102 mg/dL — ABNORMAL HIGH (ref 70–99)
Potassium: 4.4 mmol/L (ref 3.5–5.1)
Sodium: 140 mmol/L (ref 135–145)
Total Bilirubin: 0.5 mg/dL (ref 0.3–1.2)
Total Protein: 6.8 g/dL (ref 6.5–8.1)

## 2019-01-24 LAB — CBC WITH DIFFERENTIAL (CANCER CENTER ONLY)
Abs Immature Granulocytes: 0.03 10*3/uL (ref 0.00–0.07)
Basophils Absolute: 0 10*3/uL (ref 0.0–0.1)
Basophils Relative: 1 %
Eosinophils Absolute: 0.1 10*3/uL (ref 0.0–0.5)
Eosinophils Relative: 2 %
HCT: 32.1 % — ABNORMAL LOW (ref 36.0–46.0)
Hemoglobin: 10.3 g/dL — ABNORMAL LOW (ref 12.0–15.0)
Immature Granulocytes: 1 %
Lymphocytes Relative: 23 %
Lymphs Abs: 1.2 10*3/uL (ref 0.7–4.0)
MCH: 32.8 pg (ref 26.0–34.0)
MCHC: 32.1 g/dL (ref 30.0–36.0)
MCV: 102.2 fL — ABNORMAL HIGH (ref 80.0–100.0)
Monocytes Absolute: 0.4 10*3/uL (ref 0.1–1.0)
Monocytes Relative: 7 %
Neutro Abs: 3.4 10*3/uL (ref 1.7–7.7)
Neutrophils Relative %: 66 %
Platelet Count: 337 10*3/uL (ref 150–400)
RBC: 3.14 MIL/uL — ABNORMAL LOW (ref 3.87–5.11)
RDW: 15.7 % — ABNORMAL HIGH (ref 11.5–15.5)
WBC Count: 5.1 10*3/uL (ref 4.0–10.5)
nRBC: 0 % (ref 0.0–0.2)

## 2019-01-24 MED ORDER — HEPARIN SOD (PORK) LOCK FLUSH 100 UNIT/ML IV SOLN
500.0000 [IU] | Freq: Once | INTRAVENOUS | Status: AC | PRN
  Administered 2019-01-24: 500 [IU]
  Filled 2019-01-24: qty 5

## 2019-01-24 MED ORDER — ONDANSETRON HCL 4 MG/2ML IJ SOLN
8.0000 mg | Freq: Once | INTRAMUSCULAR | Status: AC
  Administered 2019-01-24: 8 mg via INTRAVENOUS

## 2019-01-24 MED ORDER — SODIUM CHLORIDE 0.9 % IV SOLN
Freq: Once | INTRAVENOUS | Status: AC
  Administered 2019-01-24: 12:00:00 via INTRAVENOUS
  Filled 2019-01-24: qty 250

## 2019-01-24 MED ORDER — SODIUM CHLORIDE 0.9% FLUSH
10.0000 mL | INTRAVENOUS | Status: DC | PRN
  Administered 2019-01-24: 10 mL
  Filled 2019-01-24: qty 10

## 2019-01-24 MED ORDER — SODIUM CHLORIDE 0.9% FLUSH
10.0000 mL | Freq: Once | INTRAVENOUS | Status: AC
  Administered 2019-01-24: 10 mL
  Filled 2019-01-24: qty 10

## 2019-01-24 MED ORDER — PACLITAXEL PROTEIN-BOUND CHEMO INJECTION 100 MG
80.0000 mg/m2 | Freq: Once | INTRAVENOUS | Status: AC
  Administered 2019-01-24: 150 mg via INTRAVENOUS
  Filled 2019-01-24: qty 30

## 2019-01-24 MED ORDER — ONDANSETRON HCL 4 MG/2ML IJ SOLN
INTRAMUSCULAR | Status: AC
  Filled 2019-01-24: qty 4

## 2019-01-24 MED ORDER — TRASTUZUMAB CHEMO 150 MG IV SOLR
6.0000 mg/kg | Freq: Once | INTRAVENOUS | Status: AC
  Administered 2019-01-24: 504 mg via INTRAVENOUS
  Filled 2019-01-24: qty 24

## 2019-01-24 MED ORDER — DIPHENHYDRAMINE HCL 50 MG/ML IJ SOLN
INTRAMUSCULAR | Status: AC
  Filled 2019-01-24: qty 1

## 2019-01-24 MED ORDER — DIPHENHYDRAMINE HCL 50 MG/ML IJ SOLN
25.0000 mg | Freq: Once | INTRAMUSCULAR | Status: AC
  Administered 2019-01-24: 25 mg via INTRAVENOUS

## 2019-01-24 NOTE — Patient Instructions (Signed)
[facility] Discharge Instructions for Patients Receiving Chemotherapy  Today you received the following chemotherapy agents Trastuzumab (HERCEPTIN) & Paclitaxel-Protein Bound (ABRAXANE).  To help prevent nausea and vomiting after your treatment, we encourage you to take your nausea medication as prescribed.   If you develop nausea and vomiting that is not controlled by your nausea medication, call the clinic.   BELOW ARE SYMPTOMS THAT SHOULD BE REPORTED IMMEDIATELY:  *FEVER GREATER THAN 100.5 F  *CHILLS WITH OR WITHOUT FEVER  NAUSEA AND VOMITING THAT IS NOT CONTROLLED WITH YOUR NAUSEA MEDICATION  *UNUSUAL SHORTNESS OF BREATH  *UNUSUAL BRUISING OR BLEEDING  TENDERNESS IN MOUTH AND THROAT WITH OR WITHOUT PRESENCE OF ULCERS  *URINARY PROBLEMS  *BOWEL PROBLEMS  UNUSUAL RASH Items with * indicate a potential emergency and should be followed up as soon as possible.  Feel free to call the clinic should you have any questions or concerns. The clinic phone number is [phone].  Please show the [facility] at check-in to the Emergency Department and triage nurse.  Coronavirus (COVID-19) Are you at risk?  Are you at risk for the Coronavirus (COVID-19)?  To be considered HIGH RISK for Coronavirus (COVID-19), you have to meet the following criteria:  . Traveled to [place], [place], [place], [place] or [place]; or in the [place] to [place], [place], [place], or [place]; and have fever, cough, and shortness of breath within the last 2 weeks of travel OR . Been in close contact with a person diagnosed with COVID-19 within the last 2 weeks and have fever, cough, and shortness of breath . IF YOU DO NOT MEET THESE CRITERIA, YOU ARE CONSIDERED LOW RISK FOR COVID-19.  What to do if you are HIGH RISK for COVID-19?  Marland Kitchen If you are having a medical emergency, call 911. . Seek medical care right away. Before you go to a doctor's office, urgent care or  emergency department, call ahead and tell them about your recent travel, contact with someone diagnosed with COVID-19, and your symptoms. You should receive instructions from your physician's office regarding next steps of care.  . When you arrive at healthcare provider, tell the healthcare staff immediately you have returned from visiting [place], [place], [place], [place] or [place]; or traveled in the [place] to [place], [place], [place], or [place]; in the last two weeks or you have been in close contact with a person diagnosed with COVID-19 in the last 2 weeks.   . Tell the health care staff about your symptoms: fever, cough and shortness of breath. . After you have been seen by a medical provider, you will be either: o Tested for (COVID-19) and discharged home on quarantine except to seek medical care if symptoms worsen, and asked to  - Stay home and avoid contact with others until you get your results (4-5 days)  - Avoid travel on public transportation if possible (such as bus, train, or airplane) or o Sent to the Emergency Department by EMS for evaluation, COVID-19 testing, and possible admission depending on your condition and test results.  What to do if you are LOW RISK for COVID-19?  Reduce your risk of any infection by using the same precautions used for avoiding the common cold or flu:  Marland Kitchen Wash your hands often with soap and warm water for at least 20 seconds.  If soap and water are not readily available, use an alcohol-based hand sanitizer with at least 60% alcohol.  Marland Kitchen  If coughing or sneezing, cover your mouth and nose by coughing or sneezing into the elbow areas of your shirt or coat, into a tissue or into your sleeve (not your hands). . Avoid shaking hands with others and consider head nods or verbal greetings only. . Avoid touching your eyes, nose, or mouth with unwashed hands.  . Avoid close contact with people who are sick. . Avoid places or events with large numbers  of people in one location, like concerts or sporting events. . Carefully consider travel plans you have or are making. . If you are planning any travel outside or inside the [place], visit the CDC's Travelers' Health webpage for the latest health notices. . If you have some symptoms but not all symptoms, continue to monitor at home and seek medical attention if your symptoms worsen. . If you are having a medical emergency, call 911.   [place] [facility] / e-Visit: [email or web address]         [facility]: [facility]: [phone]                   [facility]: [phone]

## 2019-01-25 ENCOUNTER — Ambulatory Visit
Admission: RE | Admit: 2019-01-25 | Discharge: 2019-01-25 | Disposition: A | Discharge status: Home / Self Care | Payer: BLUE CROSS/BLUE SHIELD | Source: Ambulatory Visit | Attending: Hematology and Oncology | Admitting: Hematology and Oncology

## 2019-01-25 ENCOUNTER — Encounter: Payer: Self-pay | Admitting: *Deleted

## 2019-01-25 ENCOUNTER — Encounter: Payer: Self-pay | Admitting: Hematology and Oncology

## 2019-01-25 DIAGNOSIS — C50211 Malignant neoplasm of upper-inner quadrant of right female breast: Secondary | ICD-10-CM | POA: Diagnosis not present

## 2019-01-25 DIAGNOSIS — Z170 Estrogen receptor positive status [ER+]: Secondary | ICD-10-CM

## 2019-01-25 MED ORDER — GADOBUTROL 1 MMOL/ML IV SOLN
8.0000 mL | Freq: Once | INTRAVENOUS | Status: AC | PRN
  Administered 2019-01-25: 8 mL via INTRAVENOUS

## 2019-01-28 ENCOUNTER — Other Ambulatory Visit (HOSPITAL_COMMUNITY): Payer: BLUE CROSS/BLUE SHIELD

## 2019-01-29 ENCOUNTER — Ambulatory Visit: Payer: Self-pay | Admitting: General Surgery

## 2019-01-29 DIAGNOSIS — C50211 Malignant neoplasm of upper-inner quadrant of right female breast: Secondary | ICD-10-CM

## 2019-01-29 DIAGNOSIS — Z170 Estrogen receptor positive status [ER+]: Secondary | ICD-10-CM

## 2019-01-30 ENCOUNTER — Telehealth (HOSPITAL_COMMUNITY): Payer: BLUE CROSS/BLUE SHIELD | Admitting: Internal Medicine

## 2019-01-31 ENCOUNTER — Other Ambulatory Visit (HOSPITAL_COMMUNITY): Payer: BLUE CROSS/BLUE SHIELD

## 2019-01-31 ENCOUNTER — Encounter (HOSPITAL_COMMUNITY): Payer: BLUE CROSS/BLUE SHIELD | Admitting: Internal Medicine

## 2019-01-31 ENCOUNTER — Ambulatory Visit (HOSPITAL_COMMUNITY): Payer: BLUE CROSS/BLUE SHIELD

## 2019-01-31 ENCOUNTER — Encounter: Payer: Self-pay | Admitting: *Deleted

## 2019-02-05 ENCOUNTER — Encounter: Payer: Self-pay | Admitting: *Deleted

## 2019-02-05 ENCOUNTER — Telehealth: Payer: Self-pay | Admitting: *Deleted

## 2019-02-05 NOTE — Telephone Encounter (Signed)
This RN spoke with pt per her call inquiring what she can take OTC or prescription for " head and ears stopped up".  She has used Tylenol Sinus with some minimal benefit.  Judith Thomas denies any fevers, and drainage is clear.  This RN discussed use of 12 hour sudafed and Claritin for use of symptoms- she will initiate and call if symptoms do not improve or she develops a fever.

## 2019-02-11 ENCOUNTER — Other Ambulatory Visit: Payer: Self-pay

## 2019-02-11 ENCOUNTER — Telehealth: Payer: Self-pay | Admitting: *Deleted

## 2019-02-11 ENCOUNTER — Inpatient Hospital Stay: Payer: BLUE CROSS/BLUE SHIELD | Attending: Hematology and Oncology | Admitting: Medical

## 2019-02-11 VITALS — BP 136/95 | HR 89 | Temp 97.8°F | Resp 18 | Ht 65.0 in | Wt 181.6 lb

## 2019-02-11 DIAGNOSIS — C50211 Malignant neoplasm of upper-inner quadrant of right female breast: Secondary | ICD-10-CM | POA: Insufficient documentation

## 2019-02-11 DIAGNOSIS — Z5112 Encounter for antineoplastic immunotherapy: Secondary | ICD-10-CM | POA: Insufficient documentation

## 2019-02-11 DIAGNOSIS — H6991 Unspecified Eustachian tube disorder, right ear: Secondary | ICD-10-CM

## 2019-02-11 DIAGNOSIS — Z170 Estrogen receptor positive status [ER+]: Secondary | ICD-10-CM | POA: Diagnosis not present

## 2019-02-11 DIAGNOSIS — H6981 Other specified disorders of Eustachian tube, right ear: Secondary | ICD-10-CM | POA: Diagnosis not present

## 2019-02-11 MED ORDER — FLUTICASONE PROPIONATE 50 MCG/ACT NA SUSP: 1.0000 | Freq: Two times a day (BID) | NASAL | 2 refills | Status: DC

## 2019-02-11 MED ORDER — PREDNISONE 5 MG PO TABS: ORAL_TABLET | ORAL | 0 refills | Status: DC

## 2019-02-11 NOTE — Telephone Encounter (Signed)
Received call from pt stating she has been taking Claritin and sudafed OTC x6 days for allergies and fullness feeling in her head.  Pt states that she is experiencing a "sloshing" in her head and when she talk, it echos.  Spoke with Learta Codding, RN [facility] and she said it was okay to bring pt in to see Sandi Mealy, PA today.  High priority message sent to scheduling.

## 2019-02-11 NOTE — Patient Instructions (Signed)

## 2019-02-11 NOTE — Progress Notes (Signed)
[facility] Progress Note   Judith Thomas [id number] 1956-03-22 63 y.o.  Judith Thomas is managed by Dr. 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  After each spray she will follow with 1 spray of Flonase in each nostril twice daily.  She will continue Flonase as long as needed.  Additionally she was given a prescription for a 6-day prednisone taper.  ER positive malignant neoplasm of the Judith breast: The patient continues to be followed by Dr. Lindi Adie is scheduled to return on 02/14/2019 for her next cycle of Abraxane and  trastuzumab.  Please see After Visit Summary for patient specific instructions.  Future Appointments  Date Time Provider [place]  02/14/2019 12:15 PM [facility] INFUSION [facility] None  03/07/2019  7:30 AM [facility] LAB 3 [facility] None  03/07/2019  8:00 AM [facility] [place] None  03/07/2019  8:15 AM Nicholas Lose, MD [facility] None  03/07/2019  9:15 AM [facility] INFUSION [facility] None  03/08/2019  7:00 AM [facility] INJ 1 [facility]  03/27/2019 11:00 AM [facility] ECHO 1-BUZZ [facility]  03/27/2019 12:00 PM Bensimhon, Shaune Pascal, MD [facility] None  03/28/2019  8:45 AM [facility] LAB 2 [facility] None  03/28/2019  9:00 AM [facility] [place] FLUSH [facility] None  03/28/2019  9:30 AM Nicholas Lose, MD [facility] None  03/28/2019 10:30 AM [facility]  INFUSION [facility] None  03/29/2019  8:30 AM [facility] NURSE [facility] None  03/29/2019  9:00 AM Eppie Gibson, MD [facility] None  03/29/2019 10:00 AM Eppie Gibson, MD [facility] None    No orders of the defined types were placed in this encounter.      Subjective:   Patient ID:  Judith Thomas is a 63 y.o. (DOB 03-23-1956) female.  Chief Complaint:  Chief Complaint  Patient presents with   Ear Pain    HPI Judith Thomas is a 63 year old female with a diagnosis of an ER positive malignant neoplasm of the Judith breast.  She is followed by Dr. Lindi Adie and is status post cycle 3, day 22 of cycle of Abraxane and  trastuzumab which was dosed on 01/24/2019.  She presents to the clinic today with a two-week history of sinus pressure, Judith ear fullness despite taking Claritin and Sudafed for the last week.  She is afebrile and denies shortness of breath, chest pain, nausea, vomiting, diarrhea, sore throat, rhinorrhea, or postnasal drainage.  She has had a history of sinusitis but states that her symptoms today are not indicative of a sinus infection.  Medications: I have reviewed the patient's current medications.  Allergies:  Allergies  Allergen Reactions   Paclitaxel Other (See Comments)    Nausea, flushing    Past Medical History:  Diagnosis Date   Breast cancer ([place])    Judith   Family history of bladder cancer    Family history of breast cancer    Knee pain, chronic 10/11/2015    Past Surgical History:  Procedure Laterality Date   COLONOSCOPY  03/2010   none     PORTACATH PLACEMENT Left 11/07/2018   Procedure: INSERTION PORT-A-CATH WITH ULTRASOUND;  Surgeon: Jovita Kussmaul, MD;  Location: [facility];  Service: General;  Laterality: Left;    Family History  Problem Relation Age of Onset   Liver disease Mother    Heart disease Mother        afib   Heart disease Father    Asthma Father    Diabetes Father    Bladder Cancer Father         smoker   Breast cancer Maternal Aunt    Heart disease Maternal Grandfather    Kidney disease Paternal Grandmother    Heart disease Paternal Grandfather    Heart disease Other    Breast cancer Maternal Aunt        mets to bone    Social History   Socioeconomic History   Marital status: Married    Spouse name: michael   Number of children: Not on file   Years of education: Not on file   Highest education level: Not on file  Occupational History   Not on file  Social Needs   Financial resource strain: Not on file   Food insecurity:    Worry: Not on file    Inability: Not on file   Transportation needs:    Medical: No    Non-medical: No  Tobacco Use   Smoking status: Never Smoker   Smokeless tobacco: Never Used  Substance and Sexual Activity   Alcohol use: Yes    Alcohol/week: 1.0 standard drinks    Types: 1 Standard drinks or equivalent per week   Drug use: No   Sexual activity: Not on file  Lifestyle   Physical activity:    Days per week: Not on file    Minutes per session: Not on file   Stress: Not on file  Relationships   Social connections:    Talks on phone: Not on file    Gets together: Not on file    Attends religious service: Not on file    Active member of club or organization: Not on file    Attends meetings of clubs or organizations: Not on file    Relationship status: Not on file   Intimate partner violence:    Fear of current or ex partner: No    Emotionally abused: No    Physically abused: No    Forced sexual activity: No  Other Topics Concern   Not on file  Social History Narrative   Not on file    Past Medical History, Surgical history, Social history, and Family history were reviewed and updated as appropriate.   Please see review of systems for further details on the patient's review from today.   Review of Systems:  Review of Systems  Constitutional: Negative for chills, diaphoresis, fatigue and fever.   HENT: Positive for congestion, ear pain and sinus pressure. Negative for facial swelling, postnasal drip, rhinorrhea, sinus pain and sore throat.        Fullness of the Judith ear.  Respiratory: Negative for cough and shortness of breath.   Cardiovascular: Negative for chest pain and palpitations.  Gastrointestinal: Negative for nausea and vomiting.  Neurological: Positive for headaches. Negative for dizziness.    Objective:   Physical Exam:  BP (!) 136/95 (BP Location: Left Arm, Patient Position: Sitting) Comment: Liza RN is aware   Pulse 89    Temp 97.8 F (36.6 C) (  Oral)    Resp 18    Ht 5\' 5"  (1.651 m)    Wt 181 lb 9.6 oz (82.4 kg)    SpO2 100%    BMI 30.22 kg/m  ECOG: 0  Physical Exam Constitutional:      General: She is not in acute distress.    Appearance: She is not diaphoretic.  HENT:     Head: Normocephalic and atraumatic.     Judith Ear: No middle ear effusion. Tympanic membrane is injected. Tympanic membrane is not erythematous, retracted or bulging.     Left Ear:  No middle ear effusion. Tympanic membrane is not injected, erythematous, retracted or bulging.     Nose:     Judith Sinus: No maxillary sinus tenderness or frontal sinus tenderness.     Left Sinus: No maxillary sinus tenderness or frontal sinus tenderness.     Mouth/Throat:     Pharynx: No oropharyngeal exudate.  Cardiovascular:     Rate and Rhythm: Normal rate and regular rhythm.     Heart sounds: Normal heart sounds. No murmur. No friction rub. No gallop.   Pulmonary:     Effort: Pulmonary effort is normal. No respiratory distress.     Breath sounds: Normal breath sounds. No wheezing or rales.  Skin:    General: Skin is warm and dry.     Findings: No erythema or rash.  Neurological:     Mental Status: She is alert.     Lab Review:     Component Value Date/Time   NA 140 01/24/2019 1030   NA 142 08/15/2018 1135   K 4.4 01/24/2019 1030   CL 107 01/24/2019 1030   CO2 23 01/24/2019 1030   GLUCOSE  102 (H) 01/24/2019 1030   BUN 12 01/24/2019 1030   BUN 14 08/15/2018 1135   CREATININE 0.73 01/24/2019 1030   CALCIUM 8.5 (L) 01/24/2019 1030   PROT 6.8 01/24/2019 1030   PROT 7.2 08/15/2018 1135   ALBUMIN 3.8 01/24/2019 1030   ALBUMIN 4.5 08/15/2018 1135   AST 16 01/24/2019 1030   ALT 15 01/24/2019 1030   ALKPHOS 106 01/24/2019 1030   BILITOT 0.5 01/24/2019 1030   GFRNONAA >60 01/24/2019 1030   GFRAA >60 01/24/2019 1030       Component Value Date/Time   WBC 5.1 01/24/2019 1030   RBC 3.14 (L) 01/24/2019 1030   HGB 10.3 (L) 01/24/2019 1030   HGB 12.9 08/15/2018 1135   HCT 32.1 (L) 01/24/2019 1030   HCT 38.0 08/15/2018 1135   PLT 337 01/24/2019 1030   PLT 219 08/15/2018 1135   MCV 102.2 (H) 01/24/2019 1030   MCV 90 08/15/2018 1135   MCH 32.8 01/24/2019 1030   MCHC 32.1 01/24/2019 1030   RDW 15.7 (H) 01/24/2019 1030   RDW 12.4 08/15/2018 1135   LYMPHSABS 1.2 01/24/2019 1030   LYMPHSABS 1.4 08/15/2018 1135   MONOABS 0.4 01/24/2019 1030   EOSABS 0.1 01/24/2019 1030   EOSABS 0.1 08/15/2018 1135   BASOSABS 0.0 01/24/2019 1030   BASOSABS 0.0 08/15/2018 1135   -------------------------------  Imaging from last 24 hours (if applicable):  Radiology interpretation: Mr Breast Bilateral W Wo Contrast Inc Cad  Result Date: 01/25/2019 CLINICAL DATA:  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 Assess response to chemotherapy. LABS:  Creatinine was obtained on site at [place] at [address] Results: Creatinine 0.7 mg/dL.  GFR 94. EXAM: BILATERAL BREAST MRI WITH  AND WITHOUT CONTRAST TECHNIQUE: Multiplanar, multisequence MR images of both breasts were obtained prior to and following the intravenous administration of 8 ml of Gadavist. Three-dimensional MR images were rendered by post-processing of the original MR data on an independent workstation. The three-dimensional MR images were interpreted,  and findings are reported in the following complete MRI report for this study. Three dimensional images were evaluated at the independent DynaCad workstation COMPARISON:  Previous exam(s) including previous MRI 10/28/2018. FINDINGS: Breast composition: b. Scattered fibroglandular tissue. Background parenchymal enhancement: Mild. Judith breast: Biopsy clip artifact is present over the 2 adjacent biopsy-proven sites of invasive ductal carcinoma in the middle third of the upper inner quadrant. The slightly lower more lateral site demonstrates complete imaging response to chemotherapy as there is no residual mass/enhance minute present. The slightly more superior medial cancer site demonstrates mild residual enhancement measuring 6 x 8 mm (previously 1.1 x 1.3 cm) with associated clip artifact. The 2 small adjacent subcentimeter satellite lesions previously seen anterior to the biopsy-proven cancers are no longer visualized. No additional masses or abnormal enhancement within the Judith breast. Left breast: No mass or abnormal enhancement. Lymph nodes: No abnormal appearing lymph nodes. Ancillary findings:  None. IMPRESSION: 1. Significant positive imaging response to chemotherapy. Complete resolution of the biopsy-proven cancer over the more inferolateral aspect of the middle third of the Judith upper inner quadrant as well as resolution of the 2 subcentimeter adjacent satellite nodules anterior to this cancer. Small residual enhancing 8 mm mass (previously 1.3 cm) at the second known cancer site in the middle third of the more superior medial aspect of the Judith upper inner quadrant. 2.  Normal left breast. RECOMMENDATION: Recommend continued follow-up per clinical treatment plan. BI-RADS CATEGORY  6: Known biopsy-proven malignancy. Electronically Signed   By: Marin Olp M.D.   On: 01/25/2019 15:48

## 2019-02-12 ENCOUNTER — Encounter: Payer: Self-pay | Admitting: Pharmacist

## 2019-02-12 ENCOUNTER — Telehealth: Payer: Self-pay | Admitting: Medical

## 2019-02-12 NOTE — Telephone Encounter (Signed)
No los per 5/4. °

## 2019-02-14 ENCOUNTER — Inpatient Hospital Stay: Payer: BLUE CROSS/BLUE SHIELD | Admitting: Medical

## 2019-02-14 ENCOUNTER — Other Ambulatory Visit: Payer: Self-pay | Admitting: Medical

## 2019-02-14 ENCOUNTER — Other Ambulatory Visit: Payer: Self-pay

## 2019-02-14 ENCOUNTER — Other Ambulatory Visit: Payer: BLUE CROSS/BLUE SHIELD

## 2019-02-14 ENCOUNTER — Inpatient Hospital Stay: Payer: BLUE CROSS/BLUE SHIELD

## 2019-02-14 ENCOUNTER — Ambulatory Visit: Payer: BLUE CROSS/BLUE SHIELD | Admitting: Hematology and Oncology

## 2019-02-14 VITALS — BP 144/89 | HR 71 | Temp 98.0°F | Resp 16

## 2019-02-14 DIAGNOSIS — Z170 Estrogen receptor positive status [ER+]: Secondary | ICD-10-CM | POA: Diagnosis not present

## 2019-02-14 DIAGNOSIS — Z5112 Encounter for antineoplastic immunotherapy: Secondary | ICD-10-CM | POA: Diagnosis not present

## 2019-02-14 DIAGNOSIS — C50211 Malignant neoplasm of upper-inner quadrant of right female breast: Secondary | ICD-10-CM | POA: Diagnosis not present

## 2019-02-14 DIAGNOSIS — S00411D Abrasion of right ear, subsequent encounter: Secondary | ICD-10-CM

## 2019-02-14 DIAGNOSIS — H6981 Other specified disorders of Eustachian tube, right ear: Secondary | ICD-10-CM | POA: Diagnosis not present

## 2019-02-14 MED ORDER — ACETAMINOPHEN 325 MG PO TABS
ORAL_TABLET | ORAL | Status: AC
  Filled 2019-02-14: qty 2

## 2019-02-14 MED ORDER — SODIUM CHLORIDE 0.9% FLUSH
10.0000 mL | INTRAVENOUS | Status: DC | PRN
  Administered 2019-02-14: 10 mL
  Filled 2019-02-14: qty 10

## 2019-02-14 MED ORDER — AZITHROMYCIN 250 MG PO TABS: ORAL_TABLET | ORAL | 0 refills | Status: DC

## 2019-02-14 MED ORDER — SODIUM CHLORIDE 0.9 % IV SOLN
Freq: Once | INTRAVENOUS | Status: AC
  Administered 2019-02-14: 13:00:00 via INTRAVENOUS
  Filled 2019-02-14: qty 250

## 2019-02-14 MED ORDER — DIPHENHYDRAMINE HCL 25 MG PO CAPS
50.0000 mg | ORAL_CAPSULE | Freq: Once | ORAL | Status: AC
  Administered 2019-02-14: 50 mg via ORAL

## 2019-02-14 MED ORDER — DIPHENHYDRAMINE HCL 25 MG PO CAPS
ORAL_CAPSULE | ORAL | Status: AC
  Filled 2019-02-14: qty 1

## 2019-02-14 MED ORDER — TRASTUZUMAB CHEMO 150 MG IV SOLR
6.0000 mg/kg | Freq: Once | INTRAVENOUS | Status: AC
  Administered 2019-02-14: 504 mg via INTRAVENOUS
  Filled 2019-02-14: qty 24

## 2019-02-14 MED ORDER — HEPARIN SOD (PORK) LOCK FLUSH 100 UNIT/ML IV SOLN
500.0000 [IU] | Freq: Once | INTRAVENOUS | Status: AC | PRN
  Administered 2019-02-14: 500 [IU]
  Filled 2019-02-14: qty 5

## 2019-02-14 MED ORDER — ACETAMINOPHEN 325 MG PO TABS
650.0000 mg | ORAL_TABLET | Freq: Once | ORAL | Status: AC
  Administered 2019-02-14: 650 mg via ORAL

## 2019-02-14 NOTE — Patient Instructions (Signed)
[facility] Discharge Instructions for Patients Receiving Chemotherapy  Today you received the following chemotherapy agents Trastuzumab (HERCEPTIN).  To help prevent nausea and vomiting after your treatment, we encourage you to take your nausea medication as prescribed.  If you develop nausea and vomiting that is not controlled by your nausea medication, call the clinic.   BELOW ARE SYMPTOMS THAT SHOULD BE REPORTED IMMEDIATELY:  *FEVER GREATER THAN 100.5 F  *CHILLS WITH OR WITHOUT FEVER  NAUSEA AND VOMITING THAT IS NOT CONTROLLED WITH YOUR NAUSEA MEDICATION  *UNUSUAL SHORTNESS OF BREATH  *UNUSUAL BRUISING OR BLEEDING  TENDERNESS IN MOUTH AND THROAT WITH OR WITHOUT PRESENCE OF ULCERS  *URINARY PROBLEMS  *BOWEL PROBLEMS  UNUSUAL RASH Items with * indicate a potential emergency and should be followed up as soon as possible.  Feel free to call the clinic should you have any questions or concerns. The clinic phone number is [phone].  Please show the CHEMO ALERT CARD at check-in to the Emergency Department and triage nurse.  Coronavirus (COVID-19) Are you at risk?  Are you at risk for the Coronavirus (COVID-19)?  To be considered HIGH RISK for Coronavirus (COVID-19), you have to meet the following criteria:  . Traveled to [place], [place], [place], [place] or [place]; or in the [place] to [place], [place], [place], or [place]; and have fever, cough, and shortness of breath within the last 2 weeks of travel OR . Been in close contact with a person diagnosed with COVID-19 within the last 2 weeks and have fever, cough, and shortness of breath . IF YOU DO NOT MEET THESE CRITERIA, YOU ARE CONSIDERED LOW RISK FOR COVID-19.  What to do if you are HIGH RISK for COVID-19?  . If you are having a medical emergency, call 911. . Seek medical care right away. Before you go to a doctor's office, urgent care or emergency department, call ahead and tell them  about your recent travel, contact with someone diagnosed with COVID-19, and your symptoms. You should receive instructions from your physician's office regarding next steps of care.  . When you arrive at healthcare provider, tell the healthcare staff immediately you have returned from visiting [place], [place], [place], [place] or [place]; or traveled in the [place] to [place], [place], [place], or [place]; in the last two weeks or you have been in close contact with a person diagnosed with COVID-19 in the last 2 weeks.   . Tell the health care staff about your symptoms: fever, cough and shortness of breath. . After you have been seen by a medical provider, you will be either: o Tested for (COVID-19) and discharged home on quarantine except to seek medical care if symptoms worsen, and asked to  - Stay home and avoid contact with others until you get your results (4-5 days)  - Avoid travel on public transportation if possible (such as bus, train, or airplane) or o Sent to the Emergency Department by EMS for evaluation, COVID-19 testing, and possible admission depending on your condition and test results.  What to do if you are LOW RISK for COVID-19?  Reduce your risk of any infection by using the same precautions used for avoiding the common cold or flu:  . Wash your hands often with soap and warm water for at least 20 seconds.  If soap and water are not readily available, use an alcohol-based hand sanitizer with at least 60% alcohol.  . If coughing or sneezing,   cover your mouth and nose by coughing or sneezing into the elbow areas of your shirt or coat, into a tissue or into your sleeve (not your hands). . Avoid shaking hands with others and consider head nods or verbal greetings only. . Avoid touching your eyes, nose, or mouth with unwashed hands.  . Avoid close contact with people who are sick. . Avoid places or events with large numbers of people in one location, like concerts or  sporting events. . Carefully consider travel plans you have or are making. . If you are planning any travel outside or inside the [place], visit the CDC's Travelers' Health webpage for the latest health notices. . If you have some symptoms but not all symptoms, continue to monitor at home and seek medical attention if your symptoms worsen. . If you are having a medical emergency, call 911.   ADDITIONAL HEALTHCARE OPTIONS FOR PATIENTS  [facility] / e-Visit: [email or web address]         [facility]: [phone]  [facility]: [phone]                   [facility]: [phone]   

## 2019-02-15 NOTE — Progress Notes (Signed)
Ms. Klutts had been seen on 02/11/2019 and was treated for eustachian tube dysfunction at that time.  Despite this intervention she continues to have pain and fullness in her right ear.  Her last exam did show a area of injection in the right tympanic membrane.  She had been treated with Flonase nasal spray, Afrin nasal spray, and a prednisone taper.  Despite this she has not noted improvement.  Based on this she was given a prescription for a Z-Pak.  Sandi Mealy, MHS, PA-C Physician Assistant

## 2019-02-18 ENCOUNTER — Encounter: Payer: Self-pay | Admitting: Medical

## 2019-02-22 ENCOUNTER — Other Ambulatory Visit (HOSPITAL_COMMUNITY): Payer: Self-pay | Admitting: Nurse Practitioner

## 2019-02-22 ENCOUNTER — Telehealth: Payer: Self-pay

## 2019-02-22 DIAGNOSIS — C50919 Malignant neoplasm of unspecified site of unspecified female breast: Secondary | ICD-10-CM

## 2019-02-22 NOTE — Telephone Encounter (Signed)
[id number] UPBEAT  Left message to patient about scheduling her 3 month UPBEAT visit.

## 2019-02-22 NOTE — Telephone Encounter (Signed)
Returned patient's call. Pt is available to complete UPBEAT cardiac MRI and 3 month assessments on May 20.

## 2019-02-26 ENCOUNTER — Telehealth: Payer: Self-pay | Admitting: Hematology and Oncology

## 2019-02-26 NOTE — Telephone Encounter (Signed)
R/s appt per 5/18 sch message - pt aware of appt change .

## 2019-02-27 ENCOUNTER — Ambulatory Visit (HOSPITAL_COMMUNITY): Admission: RE | Admit: 2019-02-27 | Payer: BLUE CROSS/BLUE SHIELD | Source: Ambulatory Visit

## 2019-02-27 ENCOUNTER — Inpatient Hospital Stay: Payer: BLUE CROSS/BLUE SHIELD

## 2019-02-27 ENCOUNTER — Other Ambulatory Visit: Payer: Self-pay

## 2019-02-27 DIAGNOSIS — C50211 Malignant neoplasm of upper-inner quadrant of right female breast: Secondary | ICD-10-CM

## 2019-02-27 DIAGNOSIS — Z170 Estrogen receptor positive status [ER+]: Secondary | ICD-10-CM

## 2019-02-27 NOTE — Progress Notes (Signed)
[place] 3 Month Visit  Patient arrived unaccompanied for her 3 month visit. Her cardiac MRI is scheduled for 5/26 at 12pm. Her study related labs and VS will also be completed on 5/26. Today, we reviewed and updated her medications. This RN, administered the neurocognitive assessment to the patient. The patient then completed the self-administered questionnaires. This RN reviewed them for completion and accuracy. Her depression score was 0 requiring no further follow-up at this time.The patient then completed her physical function assessments. Her waist was measured at 39.5 inches. This RN dispensed the UPBEAT tote bag to the patient. Reminded the patient about her cardiac MRI and labs next week. Patient verbalized understanding. Thanked patient for her time and participation. Johney Maine RN, BSN Clinical Research  02/27/19 2:59 PM

## 2019-02-27 NOTE — Assessment & Plan Note (Addendum)
09/27/19:Two right breast masses 12:00 to 1 o'clock position 1.3 cm and 1.2 cm, they are 1.5 cm apart. 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 Neoadjuvant chemotherapy withTaxol Herceptin weekly x12 completed 4/16/2020followed by Herceptin maintenance vsKadcylafor 1 year 2. Followed by breast conserving surgery if possible with sentinel lymph node study 3. Followed by adjuvant radiation therapy if patient had lumpectomy 4.Followed by antiestrogen therapy ------------------------------------------------------------------------------------------------------------------------------------------------------------ Current treatment: Herceptin maintenance Breast MRI post neoadjuvant chemo: 01/25/2019: Complete resolution of biopsy-proven cancer over the inferolateral aspect of the middle third of the right upper inner quadrant as well as resolution of 2 subcentimeter adjacent satellite nodules.  Small residual enhancing 8 mm mass previously 1.3 cm middle third right upper inner quadrant breast, no abnormal lymph nodes  I discussed with her the results of the breast MRI. We discussed the case in the breast tumor board and recommended breast conserving surgery with sentinel lymph node biopsy.  She will subsequently go radiation and antiestrogen therapy.  I will see the patient back 1 week after surgery through her [facility] video visit to discuss the pathology report.

## 2019-02-28 ENCOUNTER — Encounter (HOSPITAL_BASED_OUTPATIENT_CLINIC_OR_DEPARTMENT_OTHER): Payer: Self-pay

## 2019-02-28 ENCOUNTER — Other Ambulatory Visit: Payer: Self-pay

## 2019-02-28 DIAGNOSIS — C50211 Malignant neoplasm of upper-inner quadrant of right female breast: Secondary | ICD-10-CM

## 2019-02-28 DIAGNOSIS — Z170 Estrogen receptor positive status [ER+]: Secondary | ICD-10-CM

## 2019-02-28 NOTE — Addendum Note (Signed)
Addended by: Johney Maine on: 02/28/2019 10:36 AM   Modules accepted: Orders

## 2019-03-04 NOTE — Progress Notes (Signed)
Patient Care Team: Chevis Pretty, FNP as PCP - General (Nurse Practitioner) Mauro Kaufmann, RN as Oncology Nurse Navigator Rockwell Germany, RN as Oncology Nurse Navigator  DIAGNOSIS:    ICD-10-CM   1. Malignant neoplasm of upper-inner quadrant of right breast in female, estrogen receptor positive ([place]) C50.211    Z17.0     SUMMARY OF ONCOLOGIC HISTORY:   Malignant neoplasm of upper-inner quadrant of right breast in female, estrogen receptor positive ([place])   09/26/2018 Initial Diagnosis    Two right breast masses 12:00 to 1 o'clock position 1.3 cm and 1.2 cm, they are 1.5 cm apart.  Biopsy revealed grade 2 invasive ductal carcinoma ER 70%- 100%, PR 0% -20%, Ki-67 20%, HER-2 3+ by IHC and FISH ratio 2.15 with a gene copy number of 4.2 for the tumor that was 2+ by IHC, T1c N0 stage Ia    10/24/2018 Cancer Staging    Staging form: Breast, AJCC 8th Edition - Clinical stage from 10/24/2018: Stage IA (cT1c(2), cN0, cM0, G2, ER+, PR+, HER2+) - Signed by Nicholas Lose, MD on 10/24/2018    11/08/2018 -  Neo-Adjuvant Chemotherapy    Neoadjuvant chemotherapy with Taxol Herceptin weekly x12 followed by Herceptin maintenance vs Kadcyla for 1 year    01/09/2019 Genetic Testing    Negative genetic testing on the common hereditary cancer panel. The Hereditary Gene Panel offered by [facility] includes sequencing and/or deletion duplication testing of the following 48 genes: APC, ATM, AXIN2, BARD1, BMPR1A, BRCA1, BRCA2, BRIP1, CDH1, CDK4, CDKN2A (p14ARF), CDKN2A (p16INK4a), CHEK2, CTNNA1, DICER1, EPCAM (Deletion/duplication testing only), GREM1 (promoter region deletion/duplication testing only), KIT, MEN1, MLH1, MSH2, MSH3, MSH6, MUTYH, NBN, NF1, NHTL1, PALB2, PDGFRA, PMS2, POLD1, POLE, PTEN, RAD50, RAD51C, RAD51D, RNF43, SDHB, SDHC, SDHD, SMAD4, SMARCA4. STK11, TP53, TSC1, TSC2, and VHL.  The following genes were evaluated for sequence changes only: SDHA and HOXB13 c.251G>A variant only. The report date  is January 09, 2019.     CHIEF COMPLIANT: Follow-up of neoadjuvant chemotherapy and UpBeat clinical trial, on Herceptin maintenance  INTERVAL HISTORY: Judith Thomas is a 63 y.o. with above-mentioned history of right breast cancer treated with neoadjuvant chemotherapy and who is a participant in the UpBeat clinical trial. Lumpectomy is scheduled for 03/08/19 with Dr. [name]. She presents to the clinic today for follow-up.  She is here to discuss results of the breast MRI done after completion of neoadjuvant chemo.  She is also currently on Herceptin maintenance.  She is tolerating Herceptin extremely well.  REVIEW OF SYSTEMS:   Constitutional: Denies fevers, chills or abnormal weight loss Eyes: Denies blurriness of vision Ears, nose, mouth, throat, and face: Denies mucositis or sore throat Respiratory: Denies cough, dyspnea or wheezes Cardiovascular: Denies palpitation, chest discomfort Gastrointestinal: Denies nausea, heartburn or change in bowel habits Skin: Denies abnormal skin rashes Lymphatics: Denies new lymphadenopathy or easy bruising Neurological: Denies numbness, tingling or new weaknesses Behavioral/Psych: Mood is stable, no new changes  Extremities: No lower extremity edema Breast: denies any pain or lumps or nodules in either breasts All other systems were reviewed with the patient and are negative.  I have reviewed the past medical history, past surgical history, social history and family history with the patient and they are unchanged from previous note.  ALLERGIES:  is allergic to paclitaxel.  MEDICATIONS:  Current Outpatient Medications  Medication Sig Dispense Refill  . fluticasone (FLONASE) 50 MCG/ACT nasal spray Place 1 spray into both nostrils 2 (two) times daily. 16 g 2  .  lidocaine-prilocaine (EMLA) cream Apply to affected area once 30 g 3  . loratadine (CLARITIN) 10 MG tablet Take 10 mg by mouth daily.    . Multiple Vitamin (MULTIVITAMIN) tablet Take 1 tablet by  mouth daily.      No current facility-administered medications for this visit.     PHYSICAL EXAMINATION: ECOG PERFORMANCE STATUS: 1 - Symptomatic but completely ambulatory  Vitals:   03/05/19 1343 03/05/19 1344  BP: 128/79 138/79  Pulse: 67 70  Resp: 17   Temp: 98.3 F (36.8 C)   SpO2: 100%    Filed Weights   03/05/19 1343  Weight: 178 lb 11.2 oz (81.1 kg)    GENERAL: alert, no distress and comfortable SKIN: skin color, texture, turgor are normal, no rashes or significant lesions EYES: normal, Conjunctiva are pink and non-injected, sclera clear OROPHARYNX: no exudate, no erythema and lips, buccal mucosa, and tongue normal  NECK: supple, thyroid normal size, non-tender, without nodularity LYMPH: no palpable lymphadenopathy in the cervical, axillary or inguinal LUNGS: clear to auscultation and percussion with normal breathing effort HEART: regular rate & rhythm and no murmurs and no lower extremity edema ABDOMEN: abdomen soft, non-tender and normal bowel sounds MUSCULOSKELETAL: no cyanosis of digits and no clubbing  NEURO: alert & oriented x 3 with fluent speech, no focal motor/sensory deficits EXTREMITIES: No lower extremity edema  LABORATORY DATA:  I have reviewed the data as listed CMP Latest Ref Rng & Units 01/24/2019 01/17/2019 01/10/2019  Glucose 70 - 99 mg/dL 102(H) 91 108(H)  BUN 8 - 23 mg/dL _0 Creatinine 0.44 - 1.00 mg/dL 0.73 0.74 0.75  Sodium 135 - 145 mmol/L 140 140 140  Potassium 3.5 - 5.1 mmol/L 4.4 4.6 4.7  Chloride 98 - 111 mmol/L 107 106 107  CO2 22 - 32 mmol/L _1 Calcium 8.9 - 10.3 mg/dL 8.5(L) 8.8(L) 9.0  Total Protein 6.5 - 8.1 g/dL 6.8 6.8 7.0  Total Bilirubin 0.3 - 1.2 mg/dL 0.5 0.5 0.6  Alkaline Phos 38 - 126 U/L 106 99 108  AST 15 - 41 U/L 16 13(L) 14(L)  ALT 0 - 44 U/L _2 Lab Results  Component Value Date   WBC 5.2 03/05/2019   HGB 11.3 (L) 03/05/2019   HCT 35.8 (L) 03/05/2019   MCV 99.4 03/05/2019   PLT 263  03/05/2019   NEUTROABS 3.1 03/05/2019    ASSESSMENT & PLAN:  Malignant neoplasm of upper-inner quadrant of right breast in female, estrogen receptor positive ([place]) 09/27/19:Two right breast masses 12:00 to 1 o'clock position 1.3 cm and 1.2 cm, they are 1.5 cm apart. 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 Neoadjuvant chemotherapy withTaxol Herceptin weekly x12 completed 4/16/2020followed by Herceptin maintenance vsKadcylafor 1 year 2. Followed by breast conserving surgery if possible with sentinel lymph node study 3. Followed by adjuvant radiation therapy if patient had lumpectomy 4.Followed by antiestrogen therapy ------------------------------------------------------------------------------------------------------------------------------------------------------------ Current treatment: Herceptin maintenance  Breast MRI post neoadjuvant chemo: 01/25/2019: Complete resolution of biopsy-proven cancer over the inferolateral aspect of the middle third of the right upper inner quadrant as well as resolution of 2 subcentimeter adjacent satellite nodules.  Small residual enhancing 8 mm mass previously 1.3 cm middle third right upper inner quadrant breast, no  abnormal lymph nodes  I discussed with her the results of the breast MRI. We discussed the case in the breast tumor board and recommended breast conserving surgery with sentinel lymph node biopsy.  She will subsequently go radiation and antiestrogen therapy.  Her echo got delayed due to COVID-19.  It is okay to treat without a more recent echocardiogram.  I will see the patient back few days after surgery through a [facility] video visit to discuss the pathology report and determine if the maintenance therapy will be Herceptin or Kadcyla.    No orders of  the defined types were placed in this encounter.  The patient has a good understanding of the overall plan. she agrees with it. she will call with any problems that may develop before the next visit here.  Nicholas Lose, MD 03/05/2019  Julious Oka Dorshimer am acting as scribe for Dr. Nicholas Lose.  I have reviewed the above documentation for accuracy and completeness, and I agree with the above.

## 2019-03-05 ENCOUNTER — Inpatient Hospital Stay (HOSPITAL_BASED_OUTPATIENT_CLINIC_OR_DEPARTMENT_OTHER): Payer: BLUE CROSS/BLUE SHIELD | Admitting: Hematology and Oncology

## 2019-03-05 ENCOUNTER — Inpatient Hospital Stay: Payer: BLUE CROSS/BLUE SHIELD

## 2019-03-05 ENCOUNTER — Other Ambulatory Visit: Payer: BLUE CROSS/BLUE SHIELD

## 2019-03-05 ENCOUNTER — Other Ambulatory Visit: Payer: Self-pay | Admitting: Hematology and Oncology

## 2019-03-05 ENCOUNTER — Other Ambulatory Visit (HOSPITAL_COMMUNITY)
Admission: RE | Admit: 2019-03-05 | Discharge: 2019-03-05 | Disposition: A | Discharge status: Home / Self Care | Payer: BC Managed Care – PPO | Source: Ambulatory Visit | Attending: General Surgery | Admitting: General Surgery

## 2019-03-05 ENCOUNTER — Other Ambulatory Visit: Payer: Self-pay

## 2019-03-05 ENCOUNTER — Ambulatory Visit (HOSPITAL_COMMUNITY)
Admission: RE | Admit: 2019-03-05 | Discharge: 2019-03-05 | Disposition: A | Discharge status: Home / Self Care | Payer: BLUE CROSS/BLUE SHIELD | Source: Ambulatory Visit | Attending: Nurse Practitioner | Admitting: Nurse Practitioner

## 2019-03-05 ENCOUNTER — Telehealth: Payer: Self-pay

## 2019-03-05 DIAGNOSIS — Z5112 Encounter for antineoplastic immunotherapy: Secondary | ICD-10-CM | POA: Diagnosis not present

## 2019-03-05 DIAGNOSIS — C50211 Malignant neoplasm of upper-inner quadrant of right female breast: Secondary | ICD-10-CM

## 2019-03-05 DIAGNOSIS — Z1159 Encounter for screening for other viral diseases: Secondary | ICD-10-CM | POA: Diagnosis not present

## 2019-03-05 DIAGNOSIS — Z170 Estrogen receptor positive status [ER+]: Secondary | ICD-10-CM | POA: Diagnosis not present

## 2019-03-05 DIAGNOSIS — Z95828 Presence of other vascular implants and grafts: Secondary | ICD-10-CM

## 2019-03-05 DIAGNOSIS — C50919 Malignant neoplasm of unspecified site of unspecified female breast: Secondary | ICD-10-CM

## 2019-03-05 DIAGNOSIS — H6981 Other specified disorders of Eustachian tube, right ear: Secondary | ICD-10-CM | POA: Diagnosis not present

## 2019-03-05 LAB — CBC WITH DIFFERENTIAL (CANCER CENTER ONLY)
Abs Immature Granulocytes: 0.01 10*3/uL (ref 0.00–0.07)
Basophils Absolute: 0 10*3/uL (ref 0.0–0.1)
Basophils Relative: 1 %
Eosinophils Absolute: 0.2 10*3/uL (ref 0.0–0.5)
Eosinophils Relative: 4 %
HCT: 35.8 % — ABNORMAL LOW (ref 36.0–46.0)
Hemoglobin: 11.3 g/dL — ABNORMAL LOW (ref 12.0–15.0)
Immature Granulocytes: 0 %
Lymphocytes Relative: 29 %
Lymphs Abs: 1.5 10*3/uL (ref 0.7–4.0)
MCH: 31.4 pg (ref 26.0–34.0)
MCHC: 31.6 g/dL (ref 30.0–36.0)
MCV: 99.4 fL (ref 80.0–100.0)
Monocytes Absolute: 0.4 10*3/uL (ref 0.1–1.0)
Monocytes Relative: 7 %
Neutro Abs: 3.1 10*3/uL (ref 1.7–7.7)
Neutrophils Relative %: 59 %
Platelet Count: 263 10*3/uL (ref 150–400)
RBC: 3.6 MIL/uL — ABNORMAL LOW (ref 3.87–5.11)
RDW: 14.4 % (ref 11.5–15.5)
WBC Count: 5.2 10*3/uL (ref 4.0–10.5)
nRBC: 0 % (ref 0.0–0.2)

## 2019-03-05 LAB — CMP (CANCER CENTER ONLY)
ALT: 14 U/L (ref 0–44)
AST: 13 U/L — ABNORMAL LOW (ref 15–41)
Albumin: 3.8 g/dL (ref 3.5–5.0)
Alkaline Phosphatase: 92 U/L (ref 38–126)
Anion gap: 7 (ref 5–15)
BUN: 14 mg/dL (ref 8–23)
CO2: 27 mmol/L (ref 22–32)
Calcium: 9 mg/dL (ref 8.9–10.3)
Chloride: 107 mmol/L (ref 98–111)
Creatinine: 0.73 mg/dL (ref 0.44–1.00)
GFR, Est AFR Am: >60 mL/min (ref >60)
GFR, Estimated: >60 mL/min (ref >60)
Glucose, Bld: 83 mg/dL (ref 70–99)
Potassium: 4.3 mmol/L (ref 3.5–5.1)
Sodium: 141 mmol/L (ref 135–145)
Total Bilirubin: 0.5 mg/dL (ref 0.3–1.2)
Total Protein: 6.9 g/dL (ref 6.5–8.1)

## 2019-03-05 LAB — RESEARCH LABS

## 2019-03-05 MED ORDER — ACETAMINOPHEN 325 MG PO TABS
ORAL_TABLET | ORAL | Status: AC
  Filled 2019-03-05: qty 2

## 2019-03-05 MED ORDER — HEPARIN SOD (PORK) LOCK FLUSH 100 UNIT/ML IV SOLN
500.0000 [IU] | Freq: Once | INTRAVENOUS | Status: AC | PRN
  Administered 2019-03-05: 500 [IU]
  Filled 2019-03-05: qty 5

## 2019-03-05 MED ORDER — SODIUM CHLORIDE 0.9% FLUSH
10.0000 mL | Freq: Once | INTRAVENOUS | Status: AC
  Administered 2019-03-05: 10 mL
  Filled 2019-03-05: qty 10

## 2019-03-05 MED ORDER — DIPHENHYDRAMINE HCL 25 MG PO CAPS
50.0000 mg | ORAL_CAPSULE | Freq: Once | ORAL | Status: AC
  Administered 2019-03-05: 50 mg via ORAL

## 2019-03-05 MED ORDER — DIPHENHYDRAMINE HCL 25 MG PO CAPS
ORAL_CAPSULE | ORAL | Status: AC
  Filled 2019-03-05: qty 2

## 2019-03-05 MED ORDER — ACETAMINOPHEN 325 MG PO TABS
650.0000 mg | ORAL_TABLET | Freq: Once | ORAL | Status: AC
  Administered 2019-03-05: 650 mg via ORAL

## 2019-03-05 MED ORDER — TRASTUZUMAB CHEMO 150 MG IV SOLR
6.0000 mg/kg | Freq: Once | INTRAVENOUS | Status: AC
  Administered 2019-03-05: 504 mg via INTRAVENOUS
  Filled 2019-03-05: qty 24

## 2019-03-05 MED ORDER — SODIUM CHLORIDE 0.9 % IV SOLN
Freq: Once | INTRAVENOUS | Status: AC
  Administered 2019-03-05: 14:00:00 via INTRAVENOUS
  Filled 2019-03-05: qty 250

## 2019-03-05 MED ORDER — SODIUM CHLORIDE 0.9% FLUSH
10.0000 mL | INTRAVENOUS | Status: DC | PRN
  Administered 2019-03-05: 10 mL
  Filled 2019-03-05: qty 10

## 2019-03-05 NOTE — Patient Instructions (Signed)
[facility] Discharge Instructions for Patients Receiving Chemotherapy  Today you received the following chemotherapy agents:  Herceptin (trastuzumab)  To help prevent nausea and vomiting after your treatment, we encourage you to take your nausea medication as prescribed.   If you develop nausea and vomiting that is not controlled by your nausea medication, call the clinic.   BELOW ARE SYMPTOMS THAT SHOULD BE REPORTED IMMEDIATELY:  *FEVER GREATER THAN 100.5 F  *CHILLS WITH OR WITHOUT FEVER  NAUSEA AND VOMITING THAT IS NOT CONTROLLED WITH YOUR NAUSEA MEDICATION  *UNUSUAL SHORTNESS OF BREATH  *UNUSUAL BRUISING OR BLEEDING  TENDERNESS IN MOUTH AND THROAT WITH OR WITHOUT PRESENCE OF ULCERS  *URINARY PROBLEMS  *BOWEL PROBLEMS  UNUSUAL RASH Items with * indicate a potential emergency and should be followed up as soon as possible.  Feel free to call the clinic should you have any questions or concerns. The clinic phone number is [phone].  Please show the CHEMO ALERT CARD at check-in to the Emergency Department and triage nurse.   

## 2019-03-05 NOTE — Progress Notes (Signed)
[id number] UPBEAT 3 Month MRI and Labs  Patient completed her 3 month cardiac MRI at 12pm. She confirmed she had been fasting and her labs were drawn via her port. Vital signs were collected after 5 minutes of rest and her BP and HR was collected a second time a minute after the first set. After this RN confirmed that all 3 month assessments had been completed, this RN dispensed the 3 month gift card. Told patient that her next visit would be in 9 months. Thanked patient for her time and participation. Johney Maine RN, BSN Clinical Research  03/05/19 2:33 PM

## 2019-03-05 NOTE — Telephone Encounter (Signed)
Reminded patient to fast for her UPBEAT 3 month labs today at 1:30pm. Johney Maine RN, BSN Clinical Research  03/05/19 9:17 AM

## 2019-03-06 ENCOUNTER — Telehealth: Payer: Self-pay | Admitting: Hematology and Oncology

## 2019-03-06 LAB — NOVEL CORONAVIRUS, NAA (HOSP ORDER, SEND-OUT TO REF LAB; TAT 18-24 HRS): SARS-CoV-2, NAA: NOT DETECTED

## 2019-03-06 NOTE — Telephone Encounter (Signed)
Called patient about 6/3

## 2019-03-06 NOTE — Telephone Encounter (Signed)
Called regarding mychart visit

## 2019-03-07 ENCOUNTER — Other Ambulatory Visit: Payer: BLUE CROSS/BLUE SHIELD

## 2019-03-07 ENCOUNTER — Ambulatory Visit: Payer: BLUE CROSS/BLUE SHIELD | Admitting: Hematology and Oncology

## 2019-03-07 ENCOUNTER — Ambulatory Visit: Payer: BLUE CROSS/BLUE SHIELD

## 2019-03-07 DIAGNOSIS — C50211 Malignant neoplasm of upper-inner quadrant of right female breast: Secondary | ICD-10-CM | POA: Diagnosis not present

## 2019-03-07 NOTE — Anesthesia Preprocedure Evaluation (Addendum)
Anesthesia Evaluation  Patient identified by MRN, date of birth, ID band Patient awake    Reviewed: Allergy & Precautions, H&P , NPO status , Patient's Chart, lab work & pertinent test results  Airway Mallampati: II  TM Distance: >3 FB Neck ROM: Full    Dental no notable dental hx. (+) Teeth Intact   Pulmonary neg pulmonary ROS,    Pulmonary exam normal breath sounds clear to auscultation       Cardiovascular Exercise Tolerance: Good negative cardio ROS Normal cardiovascular exam Rhythm:Regular Rate:Normal     Neuro/Psych negative psych ROS   GI/Hepatic   Endo/Other  negative endocrine ROS  Renal/GU      Musculoskeletal   Abdominal   Peds  Hematology   Anesthesia Other Findings Brreast CA  Reproductive/Obstetrics                            Anesthesia Physical Anesthesia Plan  ASA: II  Anesthesia Plan: General   Post-op Pain Management:  Regional for Post-op pain   Induction: Intravenous  PONV Risk Score and Plan: 3 and Treatment may vary due to age or medical condition, Dexamethasone and Ondansetron  Airway Management Planned: LMA  Additional Equipment:   Intra-op Plan:   Post-operative Plan:   Informed Consent:     Dental advisory given  Plan Discussed with:   Anesthesia Plan Comments:         Anesthesia Quick Evaluation

## 2019-03-07 NOTE — Progress Notes (Signed)
Ensure pre surgery drink given with instructions, pt verbalized understanding.

## 2019-03-08 ENCOUNTER — Ambulatory Visit (HOSPITAL_BASED_OUTPATIENT_CLINIC_OR_DEPARTMENT_OTHER): Payer: BC Managed Care – PPO | Admitting: Anesthesiology

## 2019-03-08 ENCOUNTER — Encounter (HOSPITAL_BASED_OUTPATIENT_CLINIC_OR_DEPARTMENT_OTHER): Payer: Self-pay | Admitting: *Deleted

## 2019-03-08 ENCOUNTER — Ambulatory Visit (HOSPITAL_COMMUNITY)
Admission: RE | Admit: 2019-03-08 | Discharge: 2019-03-08 | Disposition: A | Discharge status: Home / Self Care | Payer: BC Managed Care – PPO | Source: Ambulatory Visit | Attending: General Surgery | Admitting: General Surgery

## 2019-03-08 ENCOUNTER — Encounter (HOSPITAL_BASED_OUTPATIENT_CLINIC_OR_DEPARTMENT_OTHER)
Admission: RE | Disposition: A | Discharge status: Home / Self Care | Payer: Self-pay | Source: Home / Self Care | Attending: General Surgery

## 2019-03-08 ENCOUNTER — Other Ambulatory Visit: Payer: Self-pay

## 2019-03-08 ENCOUNTER — Ambulatory Visit (HOSPITAL_BASED_OUTPATIENT_CLINIC_OR_DEPARTMENT_OTHER)
Admission: RE | Admit: 2019-03-08 | Discharge: 2019-03-08 | Disposition: A | Discharge status: Home / Self Care | Payer: BC Managed Care – PPO | Attending: General Surgery | Admitting: General Surgery

## 2019-03-08 DIAGNOSIS — C50211 Malignant neoplasm of upper-inner quadrant of right female breast: Secondary | ICD-10-CM

## 2019-03-08 DIAGNOSIS — Z9221 Personal history of antineoplastic chemotherapy: Secondary | ICD-10-CM | POA: Diagnosis not present

## 2019-03-08 DIAGNOSIS — Z170 Estrogen receptor positive status [ER+]: Secondary | ICD-10-CM | POA: Diagnosis not present

## 2019-03-08 DIAGNOSIS — C50911 Malignant neoplasm of unspecified site of right female breast: Secondary | ICD-10-CM | POA: Diagnosis not present

## 2019-03-08 DIAGNOSIS — G8918 Other acute postprocedural pain: Secondary | ICD-10-CM | POA: Diagnosis not present

## 2019-03-08 HISTORY — PX: BREAST LUMPECTOMY WITH RADIOACTIVE SEED AND SENTINEL LYMPH NODE BIOPSY: SHX6550

## 2019-03-08 SURGERY — BREAST LUMPECTOMY WITH RADIOACTIVE SEED AND SENTINEL LYMPH NODE BIOPSY
Anesthesia: General | Site: Breast | Laterality: Right

## 2019-03-08 MED ORDER — TECHNETIUM TC 99M SULFUR COLLOID FILTERED
1.0000 | Freq: Once | INTRAVENOUS | Status: AC | PRN
  Administered 2019-03-08: 1 via INTRADERMAL

## 2019-03-08 MED ORDER — LIDOCAINE HCL (CARDIAC) PF 100 MG/5ML IV SOSY
PREFILLED_SYRINGE | INTRAVENOUS | Status: DC | PRN
  Administered 2019-03-08: 80 mg via INTRAVENOUS

## 2019-03-08 MED ORDER — OXYCODONE HCL 5 MG PO TABS: 5.0000 mg | ORAL_TABLET | Freq: Once | ORAL | Status: DC | PRN

## 2019-03-08 MED ORDER — OXYCODONE HCL 5 MG/5ML PO SOLN: 5.0000 mg | Freq: Once | ORAL | Status: DC | PRN

## 2019-03-08 MED ORDER — FENTANYL CITRATE (PF) 100 MCG/2ML IJ SOLN
INTRAMUSCULAR | Status: AC
  Filled 2019-03-08: qty 2

## 2019-03-08 MED ORDER — PROPOFOL 10 MG/ML IV BOLUS
INTRAVENOUS | Status: DC | PRN
  Administered 2019-03-08: 150 mg via INTRAVENOUS

## 2019-03-08 MED ORDER — MIDAZOLAM HCL 2 MG/2ML IJ SOLN
INTRAMUSCULAR | Status: AC
  Filled 2019-03-08: qty 2

## 2019-03-08 MED ORDER — MIDAZOLAM HCL 5 MG/5ML IJ SOLN
INTRAMUSCULAR | Status: DC | PRN
  Administered 2019-03-08 (×2): 1 mg via INTRAVENOUS

## 2019-03-08 MED ORDER — HYDROMORPHONE HCL 1 MG/ML IJ SOLN: 0.2500 mg | INTRAMUSCULAR | Status: DC | PRN

## 2019-03-08 MED ORDER — CHLORHEXIDINE GLUCONATE CLOTH 2 % EX PADS: 6.0000 | MEDICATED_PAD | Freq: Once | CUTANEOUS | Status: DC

## 2019-03-08 MED ORDER — CELECOXIB 200 MG PO CAPS
ORAL_CAPSULE | ORAL | Status: AC
  Filled 2019-03-08: qty 1

## 2019-03-08 MED ORDER — FENTANYL CITRATE (PF) 100 MCG/2ML IJ SOLN
50.0000 ug | INTRAMUSCULAR | Status: AC | PRN
  Administered 2019-03-08: 50 ug via INTRAVENOUS
  Administered 2019-03-08 (×2): 25 ug via INTRAVENOUS
  Administered 2019-03-08: 50 ug via INTRAVENOUS

## 2019-03-08 MED ORDER — DEXAMETHASONE SODIUM PHOSPHATE 4 MG/ML IJ SOLN
INTRAMUSCULAR | Status: DC | PRN
  Administered 2019-03-08: 10 mg via INTRAVENOUS

## 2019-03-08 MED ORDER — HYDROCODONE-ACETAMINOPHEN 5-325 MG PO TABS: 1.0000 | ORAL_TABLET | Freq: Four times a day (QID) | ORAL | 0 refills | Status: DC | PRN

## 2019-03-08 MED ORDER — BUPIVACAINE HCL (PF) 0.25 % IJ SOLN
INTRAMUSCULAR | Status: DC | PRN
  Administered 2019-03-08: 30 mL

## 2019-03-08 MED ORDER — ACETAMINOPHEN 500 MG PO TABS
ORAL_TABLET | ORAL | Status: AC
  Filled 2019-03-08: qty 2

## 2019-03-08 MED ORDER — SODIUM CHLORIDE (PF) 0.9 % IJ SOLN
INTRAVENOUS | Status: DC | PRN
  Administered 2019-03-08: 5 mL via INTRAMUSCULAR

## 2019-03-08 MED ORDER — LIDOCAINE 2% (20 MG/ML) 5 ML SYRINGE
INTRAMUSCULAR | Status: AC
  Filled 2019-03-08: qty 5

## 2019-03-08 MED ORDER — CELECOXIB 200 MG PO CAPS
200.0000 mg | ORAL_CAPSULE | ORAL | Status: AC
  Administered 2019-03-08: 200 mg via ORAL

## 2019-03-08 MED ORDER — MIDAZOLAM HCL 2 MG/2ML IJ SOLN
1.0000 mg | INTRAMUSCULAR | Status: DC | PRN
  Administered 2019-03-08: 2 mg via INTRAVENOUS

## 2019-03-08 MED ORDER — ONDANSETRON HCL 4 MG/2ML IJ SOLN
INTRAMUSCULAR | Status: DC | PRN
  Administered 2019-03-08: 4 mg via INTRAVENOUS

## 2019-03-08 MED ORDER — CEFAZOLIN SODIUM-DEXTROSE 2-4 GM/100ML-% IV SOLN
2.0000 g | INTRAVENOUS | Status: AC
  Administered 2019-03-08: 2 g via INTRAVENOUS

## 2019-03-08 MED ORDER — ONDANSETRON HCL 4 MG/2ML IJ SOLN
INTRAMUSCULAR | Status: AC
  Filled 2019-03-08: qty 2

## 2019-03-08 MED ORDER — BUPIVACAINE HCL (PF) 0.25 % IJ SOLN
INTRAMUSCULAR | Status: AC
  Filled 2019-03-08: qty 30

## 2019-03-08 MED ORDER — CEFAZOLIN SODIUM-DEXTROSE 2-4 GM/100ML-% IV SOLN
INTRAVENOUS | Status: AC
  Filled 2019-03-08: qty 100

## 2019-03-08 MED ORDER — DEXAMETHASONE SODIUM PHOSPHATE 10 MG/ML IJ SOLN
INTRAMUSCULAR | Status: AC
  Filled 2019-03-08: qty 1

## 2019-03-08 MED ORDER — GABAPENTIN 300 MG PO CAPS
300.0000 mg | ORAL_CAPSULE | ORAL | Status: AC
  Administered 2019-03-08: 300 mg via ORAL

## 2019-03-08 MED ORDER — ACETAMINOPHEN 500 MG PO TABS
1000.0000 mg | ORAL_TABLET | ORAL | Status: AC
  Administered 2019-03-08: 1000 mg via ORAL

## 2019-03-08 MED ORDER — MEPERIDINE HCL 25 MG/ML IJ SOLN: 6.2500 mg | INTRAMUSCULAR | Status: DC | PRN

## 2019-03-08 MED ORDER — LACTATED RINGERS IV SOLN
INTRAVENOUS | Status: DC
  Administered 2019-03-08: 07:00:00 via INTRAVENOUS

## 2019-03-08 MED ORDER — GABAPENTIN 300 MG PO CAPS
ORAL_CAPSULE | ORAL | Status: AC
  Filled 2019-03-08: qty 1

## 2019-03-08 MED ORDER — EPHEDRINE SULFATE 50 MG/ML IJ SOLN
INTRAMUSCULAR | Status: DC | PRN
  Administered 2019-03-08: 10 mg via INTRAVENOUS

## 2019-03-08 MED ORDER — ONDANSETRON HCL 4 MG/2ML IJ SOLN: 4.0000 mg | Freq: Once | INTRAMUSCULAR | Status: DC | PRN

## 2019-03-08 MED ORDER — PROPOFOL 500 MG/50ML IV EMUL
INTRAVENOUS | Status: AC
  Filled 2019-03-08: qty 50

## 2019-03-08 MED ORDER — METHYLENE BLUE 0.5 % INJ SOLN
INTRAVENOUS | Status: AC
  Filled 2019-03-08: qty 10

## 2019-03-08 MED ORDER — SCOPOLAMINE 1 MG/3DAYS TD PT72: 1.0000 | MEDICATED_PATCH | Freq: Once | TRANSDERMAL | Status: DC | PRN

## 2019-03-08 SURGICAL SUPPLY — 45 items
ADH SKN CLS APL DERMABOND .7 (GAUZE/BANDAGES/DRESSINGS) ×1
APL PRP STRL LF DISP 70% ISPRP (MISCELLANEOUS) ×1
APPLIER CLIP 9.375 MED OPEN (MISCELLANEOUS) ×2
APR CLP MED 9.3 20 MLT OPN (MISCELLANEOUS) ×1
BLADE SURG 15 STRL LF DISP TIS (BLADE) ×1 IMPLANT
BLADE SURG 15 STRL SS (BLADE) ×2
CANISTER SUC SOCK COL 7IN (MISCELLANEOUS) IMPLANT
CANISTER SUCT 1200ML W/VALVE (MISCELLANEOUS) ×1 IMPLANT
CHLORAPREP W/TINT 26 (MISCELLANEOUS) ×2 IMPLANT
CLIP APPLIE 9.375 MED OPEN (MISCELLANEOUS) ×1 IMPLANT
COVER BACK TABLE REUSABLE LG (DRAPES) ×2 IMPLANT
COVER MAYO STAND REUSABLE (DRAPES) ×2 IMPLANT
COVER PROBE W GEL 5X96 (DRAPES) ×2 IMPLANT
COVER WAND RF STERILE (DRAPES) IMPLANT
DECANTER SPIKE VIAL GLASS SM (MISCELLANEOUS) ×1 IMPLANT
DERMABOND ADVANCED (GAUZE/BANDAGES/DRESSINGS) ×1
DERMABOND ADVANCED .7 DNX12 (GAUZE/BANDAGES/DRESSINGS) ×1 IMPLANT
DRAPE LAPAROSCOPIC ABDOMINAL (DRAPES) ×2 IMPLANT
DRAPE UTILITY XL STRL (DRAPES) ×2 IMPLANT
ELECT COATED BLADE 2.86 ST (ELECTRODE) ×2 IMPLANT
ELECT REM PT RETURN 9FT ADLT (ELECTROSURGICAL) ×2
ELECTRODE REM PT RTRN 9FT ADLT (ELECTROSURGICAL) ×1 IMPLANT
GLOVE BIO SURGEON STRL SZ7.5 (GLOVE) ×2 IMPLANT
GOWN STRL REUS W/ TWL LRG LVL3 (GOWN DISPOSABLE) ×2 IMPLANT
GOWN STRL REUS W/TWL LRG LVL3 (GOWN DISPOSABLE) ×4
ILLUMINATOR WAVEGUIDE N/F (MISCELLANEOUS) IMPLANT
KIT MARKER MARGIN INK (KITS) ×2 IMPLANT
LIGHT WAVEGUIDE WIDE FLAT (MISCELLANEOUS) IMPLANT
NDL HYPO 25X1 1.5 SAFETY (NEEDLE) ×1 IMPLANT
NDL SAFETY ECLIPSE 18X1.5 (NEEDLE) IMPLANT
NEEDLE HYPO 18GX1.5 SHARP (NEEDLE) ×2
NEEDLE HYPO 25X1 1.5 SAFETY (NEEDLE) ×2 IMPLANT
NS IRRIG 1000ML POUR BTL (IV SOLUTION) ×1 IMPLANT
PACK BASIN DAY SURGERY FS (CUSTOM PROCEDURE TRAY) ×2 IMPLANT
PENCIL BUTTON HOLSTER BLD 10FT (ELECTRODE) ×2 IMPLANT
SLEEVE SCD COMPRESS KNEE MED (MISCELLANEOUS) ×2 IMPLANT
SPONGE LAP 18X18 RF (DISPOSABLE) ×2 IMPLANT
SUT MON AB 4-0 PC3 18 (SUTURE) ×4 IMPLANT
SUT SILK 2 0 SH (SUTURE) IMPLANT
SUT VICRYL 3-0 CR8 SH (SUTURE) ×2 IMPLANT
SYR CONTROL 10ML LL (SYRINGE) ×2 IMPLANT
TOWEL GREEN STERILE FF (TOWEL DISPOSABLE) ×2 IMPLANT
TRAY FAXITRON CT DISP (TRAY / TRAY PROCEDURE) ×2 IMPLANT
TUBE CONNECTING 20X1/4 (TUBING) ×1 IMPLANT
YANKAUER SUCT BULB TIP NO VENT (SUCTIONS) ×1 IMPLANT

## 2019-03-08 NOTE — Anesthesia Procedure Notes (Addendum)
Anesthesia Regional Block: Pectoralis block   Pre-Anesthetic Checklist: ,, timeout performed, Correct Patient, Correct Site, Correct Laterality, Correct Procedure, Correct Position, site marked, Risks and benefits discussed,  Surgical consent,  Pre-op evaluation,  At surgeon's request and post-op pain management  Laterality: Right  Prep: chloraprep       Needles:  Injection technique: Single-shot  Needle Type: Echogenic Needle     Needle Length: 9cm  Needle Gauge: 21     Additional Needles:   Procedures:,,,, ultrasound used (permanent image in chart),,,,  Narrative:  Start time: 03/08/2019 8:01 AM End time: 03/08/2019 8:11 AM Injection made incrementally with aspirations every 5 mL.  Performed by: Personally  Anesthesiologist: Barnet Glasgow, MD  Additional Notes: Block assessed. Patient tolerated procedure well.

## 2019-03-08 NOTE — Progress Notes (Signed)
Emotional support during breast injections °

## 2019-03-08 NOTE — Discharge Instructions (Signed)

## 2019-03-08 NOTE — Op Note (Signed)
03/08/2019  10:11 AM  PATIENT:  Judith Thomas  63 y.o. female  PRE-OPERATIVE DIAGNOSIS:  RIGHT BREAST CANCER X2  POST-OPERATIVE DIAGNOSIS:  RIGHT BREAST CANCER X2  PROCEDURE:  Procedure(s): RIGHT BREAST RADIOACTIVE SEED LOCALIZED LUMPECTOMY X2(3 SEEDS) AND RIGHT DEEP AXILLARY SENTINEL LYMPH NODE BIOPSY WITH INJECTION BLUE DYE (Right)  SURGEON:  Surgeon(s) and Role:    * Jovita Kussmaul, MD - Primary  PHYSICIAN ASSISTANT:   ASSISTANTS: none   ANESTHESIA:   local and general  EBL:  Minimal   BLOOD ADMINISTERED:none  DRAINS: none   LOCAL MEDICATIONS USED:  MARCAINE     SPECIMEN:  Source of Specimen:  right breast tissue and sentinel nodes X 2  DISPOSITION OF SPECIMEN:  PATHOLOGY  COUNTS:  YES  TOURNIQUET:  * No tourniquets in log *  DICTATION: .Dragon Dictation   After informed consent was obtained the patient was brought to the operating room and placed in the supine position on the operating table.  After adequate induction of general anesthesia the patient's right chest, breast, and axillary area were prepped with ChloraPrep, allowed to dry, and draped in usual sterile manner.  An appropriate timeout was performed.  Previously 3 I-125 seeds were placed in the upper inner quadrant of the right breast to mark 2 areas of invasive breast cancer.  Earlier in the day the patient underwent injection 1 mCi of technetium sulfur colloid in the subareolar position on the right.  At this point, 4 cc of methylene blue and 1 cc of injectable saline were also injected in the subareolar position on the right breast.  Attention was first turned to the right axilla.  The neoprobe was set to technetium and a faint area of radioactivity was identified.  This area was infiltrated with quarter percent Marcaine.  A small transversely oriented incision was made with a 15 blade knife overlying the area of radioactivity.  The incision was carried through the skin and subcutaneous tissue sharply with  electrocautery until the deep right axillary space was entered.  Blunt hemostat dissection was carried out under the direction of the neoprobe.  I was able to identify one lymph node with increased radioactivity.  This lymph node was excised sharply with the electrocautery and the lymphatics were controlled with clips.  Adjacent to this there was also a moderate sized palpable lymph node that was also excised sharply with the electrocautery and the lymphatics were controlled with clips.  Ex vivo counts on sentinel node #1 were approximately 40.  These were both sent as sentinel nodes numbers 1 and 2 to pathology.  No other hot, blue, or palpable lymph nodes were identified in the right axilla.  The deep layer of the wound was then closed with interrupted 3-0 Vicryl stitches.  The skin was closed with a running 4-0 Monocryl subcuticular stitch.  Attention was then turned to the right breast.  The neoprobe was set to I-125 in the area of the radioactive seeds was identified.  Because of the size of the area and because on her initial imaging she had some satellite lesions anterior to the 2 cancers I elected to make an elliptical incision in the skin overlying the area of radioactivity with a 15 blade knife.  The incision was carried through the skin and subcutaneous tissue sharply with the electrocautery.  A moderate sized circular portion of breast tissue was then excised sharply around the 3 radioactive seeds while checking the area of radioactivity frequently.  Once the specimen  was removed it was oriented with the appropriate paint colors.  A specimen radiograph was obtained that showed the 2 clips and 3 seeds to be all near the center of the specimen.  The specimen was then sent to pathology for further evaluation.  Hemostasis was achieved using the Bovie electrocautery.  The cavity was marked with clips.  The wound was infiltrated with more quarter percent Marcaine and irrigated with saline.  The deep layer of  the wound was then closed with layers of interrupted 3-0 Vicryl stitches.  The skin was closed with a running 4-0 Monocryl subcuticular stitch.  Dermabond dressings were applied.  The patient tolerated the procedure well.  At the end of the case all needle sponge and instrument counts were correct.  The patient was then awakened and taken to recovery in stable condition.  PLAN OF CARE: Discharge to home after PACU  PATIENT DISPOSITION:  PACU - hemodynamically stable.   Delay start of Pharmacological VTE agent (>24hrs) due to surgical blood loss or risk of bleeding: not applicable

## 2019-03-08 NOTE — H&P (Signed)
Judith Thomas  Location: [name] Surgery Patient #: [id number] DOB: 02-Nov-1955 Married / Language: English / Race: White Female   History of Present Illness The patient is a 63 year old female who presents for a follow-up for Breast cancer. The patient is a 63 year old white female who has 2 small known cancers in the upper inner portion of the right breast. One cancer was 1.2 cm and was ER positive and PR negative and HER-2 positive with a Ki-67 of 20%. The second cancer was 1.3 cm and was triple positive with a Ki-67 of 10%. There were also some very small satellite nodules anterior to each one of these. She has received neoadjuvant chemotherapy and the satellite nodules have disappeared as well as the 1.2 cm cancer. The 1.3 cm cancer is now 8 mm. The lymph nodes looked normal. Her last dose of chemotherapy was just a few days ago. She is now ready to schedule her definitive surgery.   Medication History No Current Medications Medications Reconciled    Review of Systems  General Not Present- Appetite Loss, Chills, Fatigue, Fever, Night Sweats, Weight Gain and Weight Loss. Skin Not Present- Change in Wart/Mole, Dryness, Hives, Jaundice, New Lesions, Non-Healing Wounds, Rash and Ulcer. HEENT Present- Seasonal Allergies and Wears glasses/contact lenses. Not Present- Earache, Hearing Loss, Hoarseness, Nose Bleed, Oral Ulcers, Ringing in the Ears, Sinus Pain, Sore Throat, Visual Disturbances and Yellow Eyes. Respiratory Not Present- Bloody sputum, Chronic Cough, Difficulty Breathing, Snoring and Wheezing. Breast Present- Breast Mass. Not Present- Breast Pain, Nipple Discharge and Skin Changes. Cardiovascular Not Present- Chest Pain, Difficulty Breathing Lying Down, Leg Cramps, Palpitations, Rapid Heart Rate, Shortness of Breath and Swelling of Extremities. Gastrointestinal Not Present- Abdominal Pain, Bloating, Bloody Stool, Change in Bowel Habits, Chronic diarrhea,  Constipation, Difficulty Swallowing, Excessive gas, Gets full quickly at meals, Hemorrhoids, Indigestion, Nausea, Rectal Pain and Vomiting. Female Genitourinary Present- Frequency. Not Present- Nocturia, Painful Urination, Pelvic Pain and Urgency. Musculoskeletal Not Present- Back Pain, Joint Pain, Joint Stiffness, Muscle Pain, Muscle Weakness and Swelling of Extremities. Neurological Not Present- Decreased Memory, Fainting, Headaches, Numbness, Seizures, Tingling, Tremor, Trouble walking and Weakness. Psychiatric Not Present- Anxiety, Bipolar, Change in Sleep Pattern, Depression, Fearful and Frequent crying. Endocrine Present- Hot flashes. Not Present- Cold Intolerance, Excessive Hunger, Hair Changes, Heat Intolerance and New Diabetes. Hematology Not Present- Blood Thinners, Easy Bruising, Excessive bleeding, Gland problems, HIV and Persistent Infections.  Vitals  Weight: 185 lb Height: 65in Body Surface Area: 1.91 m Body Mass Index: 30.79 kg/m  Temp.: 98.51F  Pulse: 92 (Regular)  P.OX: 99% (Room air) BP: 136/90 (Sitting, Left Arm, Standard)       Physical Exam General Mental Status-Alert. General Appearance-Consistent with stated age. Hydration-Well hydrated. Voice-Normal.  Head and Neck Head-normocephalic, atraumatic with no lesions or palpable masses. Trachea-midline. Thyroid Gland Characteristics - normal size and consistency.  Eye Eyeball - Bilateral-Extraocular movements intact. Sclera/Conjunctiva - Bilateral-No scleral icterus.  Chest and Lung Exam Chest and lung exam reveals -quiet, even and easy respiratory effort with no use of accessory muscles and on auscultation, normal breath sounds, no adventitious sounds and normal vocal resonance. Inspection Chest Wall - Normal. Back - normal.  Breast Note: There is no palpable mass in either breast. There is no palpable axillary, supraclavicular, or cervical  lymphadenopathy.   Cardiovascular Cardiovascular examination reveals -normal heart sounds, regular rate and rhythm with no murmurs and normal pedal pulses bilaterally.  Abdomen Inspection Inspection of the abdomen reveals - No Hernias. Skin - Scar -  no surgical scars. Palpation/Percussion Palpation and Percussion of the abdomen reveal - Soft, Non Tender, No Rebound tenderness, No Rigidity (guarding) and No hepatosplenomegaly. Auscultation Auscultation of the abdomen reveals - Bowel sounds normal.  Neurologic Neurologic evaluation reveals -alert and oriented x 3 with no impairment of recent or remote memory. Mental Status-Normal.  Musculoskeletal Normal Exam - Left-Upper Extremity Strength Normal and Lower Extremity Strength Normal. Normal Exam - Right-Upper Extremity Strength Normal and Lower Extremity Strength Normal.  Lymphatic Head & Neck  General Head & Neck Lymphatics: Bilateral - Description - Normal. Axillary  General Axillary Region: Bilateral - Description - Normal. Tenderness - Non Tender. Femoral & Inguinal  Generalized Femoral & Inguinal Lymphatics: Bilateral - Description - Normal. Tenderness - Non Tender.    Assessment & Plan  MALIGNANT NEOPLASM OF UPPER-INNER QUADRANT OF RIGHT BREAST IN FEMALE, ESTROGEN RECEPTOR POSITIVE (C50.211) Impression: The patient appears to have 2 small cancers in the upper portion of the right breast that have responded very well to neoadjuvant chemotherapy. At this point she is ready for her definitive surgery. We will plan for 2 right breast radioactive seed localized lumpectomies and sentinel node mapping. I have discussed with her in detail the risks and benefits of the operation as well as some the technical aspects and she understands and wishes to proceed. We will leave her port in that she was still beginning Herceptin for some time.

## 2019-03-08 NOTE — Anesthesia Procedure Notes (Signed)
Procedure Name: LMA Insertion Date/Time: 03/08/2019 9:01 AM Performed by: Lyndee Leo, CRNA Pre-anesthesia Checklist: Patient identified, Emergency Drugs available, Suction available and Patient being monitored Patient Re-evaluated:Patient Re-evaluated prior to induction Oxygen Delivery Method: Circle system utilized Preoxygenation: Pre-oxygenation with 100% oxygen Induction Type: IV induction Ventilation: Mask ventilation without difficulty LMA: LMA inserted LMA Size: 4.0 Number of attempts: 1 Airway Equipment and Method: Bite block Placement Confirmation: positive ETCO2 Tube secured with: Tape Dental Injury: Teeth and Oropharynx as per pre-operative assessment

## 2019-03-08 NOTE — Progress Notes (Signed)
Assisted Dr. Houser with right, ultrasound guided, pectoralis block. Side rails up, monitors on throughout procedure. See vital signs in flow sheet. Tolerated Procedure well. 

## 2019-03-08 NOTE — Transfer of Care (Signed)
Immediate Anesthesia Transfer of Care Note  Patient: Judith Thomas  Procedure(s) Performed: RIGHT BREAST RADIOACTIVE SEED LUMPECTOMY X2 AND SENTINEL LYMPH NODE BIOPSY (Right Breast)  Patient Location: PACU  Anesthesia Type:GA combined with regional for post-op pain  Level of Consciousness: awake, sedated and patient cooperative  Airway & Oxygen Therapy: Patient Spontanous Breathing and Patient connected to nasal cannula oxygen  Post-op Assessment: Report given to RN and Post -op Vital signs reviewed and stable  Post vital signs: Reviewed and stable  Last Vitals:  Vitals Value Taken Time  BP 115/66 03/08/2019 10:16 AM  Temp    Pulse 84 03/08/2019 10:17 AM  Resp 17 03/08/2019 10:17 AM  SpO2 100 % 03/08/2019 10:17 AM  Vitals shown include unvalidated device data.  Last Pain:  Vitals:   03/08/19 0701  TempSrc: Oral  PainSc: 1       Patients Stated Pain Goal: 1 (32/44/01 0272)  Complications: No apparent anesthesia complications

## 2019-03-08 NOTE — Interval H&P Note (Signed)
History and Physical Interval Note:  03/08/2019 7:21 AM  Judith Thomas  has presented today for surgery, with the diagnosis of RIGHT BREAST CANCER X2.  The various methods of treatment have been discussed with the patient and family. After consideration of risks, benefits and other options for treatment, the patient has consented to  Procedure(s): RIGHT BREAST RADIOACTIVE SEED LUMPECTOMY X2 AND SENTINEL LYMPH NODE BIOPSY (Right) as a surgical intervention.  The patient's history has been reviewed, patient examined, no change in status, stable for surgery.  I have reviewed the patient's chart and labs.  Questions were answered to the patient's satisfaction.     [name]

## 2019-03-08 NOTE — Anesthesia Postprocedure Evaluation (Signed)
Anesthesia Post Note  Patient: Judith Thomas  Procedure(s) Performed: RIGHT BREAST RADIOACTIVE SEED LUMPECTOMY X2 AND SENTINEL LYMPH NODE BIOPSY (Right Breast)     Patient location during evaluation: PACU Anesthesia Type: General Level of consciousness: awake and alert Pain management: pain level controlled Vital Signs Assessment: post-procedure vital signs reviewed and stable Respiratory status: spontaneous breathing, nonlabored ventilation, respiratory function stable and patient connected to nasal cannula oxygen Cardiovascular status: blood pressure returned to baseline and stable Postop Assessment: no apparent nausea or vomiting Anesthetic complications: no    Last Vitals:  Vitals:   03/08/19 1039 03/08/19 1100  BP: 111/74 127/71  Pulse: 87 78  Resp: 16 20  Temp:  36.6 C  SpO2: 98% 100%    Last Pain:  Vitals:   03/08/19 1100  TempSrc:   PainSc: 0-No pain                 Barnet Glasgow

## 2019-03-12 ENCOUNTER — Encounter (HOSPITAL_BASED_OUTPATIENT_CLINIC_OR_DEPARTMENT_OTHER): Payer: Self-pay | Admitting: General Surgery

## 2019-03-12 NOTE — Progress Notes (Signed)
HEMATOLOGY-ONCOLOGY DOXIMITY VISIT PROGRESS NOTE  I connected with Judith Thomas on 03/13/2019 at  8:45 AM EDT by Doximity video conference and verified that I am speaking with the correct person using two identifiers.  I discussed the limitations, risks, security and privacy concerns of performing an evaluation and management service by Doximity and the availability of in person appointments.  I also discussed with the patient that there may be a patient responsible charge related to this service. The patient expressed understanding and agreed to proceed.  Patient's Location: Home Physician Location: Clinic  CHIEF COMPLIANT: Follow-up s/p lumpectomy to review pathology and discuss further treatment  INTERVAL HISTORY: Judith Thomas is a 63 y.o. female with above-mentioned history of right breast cancer treated with neoadjuvant chemotherapy and who is a participant in the UpBeat clinical trial. She underwent a lumpectomy with Dr. [name] on 03/08/19 for which pathology confirmed IDC, grade 2, 0.7cm, HER2 equivocal, ER 100%, PR 5%, Ki67 20%, with 2 sentinel lymph nodes negative for carcinoma, with clear margins.     Malignant neoplasm of upper-inner quadrant of right breast in female, estrogen receptor positive ([place])   09/26/2018 Initial Diagnosis    Two right breast masses 12:00 to 1 o'clock position 1.3 cm and 1.2 cm, they are 1.5 cm apart.  Biopsy revealed grade 2 invasive ductal carcinoma ER 70%- 100%, PR 0% -20%, Ki-67 20%, HER-2 3+ by IHC and FISH ratio 2.15 with a gene copy number of 4.2 for the tumor that was 2+ by IHC, T1c N0 stage Ia    10/24/2018 Cancer Staging    Staging form: Breast, AJCC 8th Edition - Clinical stage from 10/24/2018: Stage IA (cT1c(2), cN0, cM0, G2, ER+, PR+, HER2+) - Signed by Nicholas Lose, MD on 10/24/2018    11/08/2018 -  Neo-Adjuvant Chemotherapy    Neoadjuvant chemotherapy with Taxol Herceptin weekly x12 followed by Herceptin maintenance vs Kadcyla for 1 year   01/09/2019 Genetic Testing    Negative genetic testing on the common hereditary cancer panel. The Hereditary Gene Panel offered by [facility] includes sequencing and/or deletion duplication testing of the following 48 genes: APC, ATM, AXIN2, BARD1, BMPR1A, BRCA1, BRCA2, BRIP1, CDH1, CDK4, CDKN2A (p14ARF), CDKN2A (p16INK4a), CHEK2, CTNNA1, DICER1, EPCAM (Deletion/duplication testing only), GREM1 (promoter region deletion/duplication testing only), KIT, MEN1, MLH1, MSH2, MSH3, MSH6, MUTYH, NBN, NF1, NHTL1, PALB2, PDGFRA, PMS2, POLD1, POLE, PTEN, RAD50, RAD51C, RAD51D, RNF43, SDHB, SDHC, SDHD, SMAD4, SMARCA4. STK11, TP53, TSC1, TSC2, and VHL.  The following genes were evaluated for sequence changes only: SDHA and HOXB13 c.251G>A variant only. The report date is January 09, 2019.    03/08/2019 Surgery    Right lumpectomy [name]): IDC s/p neoadjuvant treatment, grade 2, 0.7cm, ER+ 100%, PR+ 5%, HER2 equivocal, Ki67 20%, 0/2 right axillary sentinel lymph nodes negative for carcinoma, with clear margins.      REVIEW OF SYSTEMS:   Constitutional: Denies fevers, chills or abnormal weight loss Eyes: Denies blurriness of vision Ears, nose, mouth, throat, and face: Denies mucositis or sore throat Respiratory: Denies cough, dyspnea or wheezes Cardiovascular: Denies palpitation, chest discomfort Gastrointestinal:  Denies nausea, heartburn or change in bowel habits Skin: Denies abnormal skin rashes Lymphatics: Denies new lymphadenopathy or easy bruising Neurological:Denies numbness, tingling or new weaknesses Behavioral/Psych: Mood is stable, no new changes  Extremities: No lower extremity edema Breast: Recovering very well from recent surgery All other systems were reviewed with the patient and are negative.  Observations/Objective:  There were no vitals filed for this visit. There is  no height or weight on file to calculate BMI.  I have reviewed the data as listed CMP Latest Ref Rng & Units 03/05/2019 01/24/2019  01/17/2019  Glucose 70 - 99 mg/dL 83 102(H) 91  BUN 8 - 23 mg/dL _0 Creatinine 0.44 - 1.00 mg/dL 0.73 0.73 0.74  Sodium 135 - 145 mmol/L 141 140 140  Potassium 3.5 - 5.1 mmol/L 4.3 4.4 4.6  Chloride 98 - 111 mmol/L 107 107 106  CO2 22 - 32 mmol/L _1 Calcium 8.9 - 10.3 mg/dL 9.0 8.5(L) 8.8(L)  Total Protein 6.5 - 8.1 g/dL 6.9 6.8 6.8  Total Bilirubin 0.3 - 1.2 mg/dL 0.5 0.5 0.5  Alkaline Phos 38 - 126 U/L 92 106 99  AST 15 - 41 U/L 13(L) 16 13(L)  ALT 0 - 44 U/L _2 Lab Results  Component Value Date   WBC 5.2 03/05/2019   HGB 11.3 (L) 03/05/2019   HCT 35.8 (L) 03/05/2019   MCV 99.4 03/05/2019   PLT 263 03/05/2019   NEUTROABS 3.1 03/05/2019      Assessment Plan:  Malignant neoplasm of upper-inner quadrant of right breast in female, estrogen receptor positive ([place]) 09/27/19:Two right breast masses 12:00 to 1 o'clock position 1.3 cm and 1.2 cm, they are 1.5 cm apart. 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 Neoadjuvant chemotherapy withTaxol Herceptin weekly x12 completed 4/16/2020followed by Herceptin maintenance vsKadcylafor 1 year 2.  03/08/2019: Right lumpectomy IDC s/p neoadjuvant treatment, grade 2, 0.7cm, ER+ 100%, PR+ 5%, HER2 equivocal, Ki67 20%, 0/2 right axillary sentinel lymph nodes negative for carcinoma, with clear margins.  3. Followed by adjuvant radiation therapy 4.Followed by antiestrogen therapy ------------------------------------------------------------------------------------------------------------------------------------------------------------ Treatment plan: Because there was small amount of residual disease, I recommended switching from Herceptin maintenance to Kadcyla maintenance.  Pathology counseling: I discussed the final pathology report of  the patient provided  a copy of this report. I discussed the margins as well as lymph node surgeries. We also discussed the final staging along with previously performed ER/PR and HER-2/neu testing.  Return to clinic to start [place] on 03/26/2019  I discussed the assessment and treatment plan with the patient. The patient was provided an opportunity to ask questions and all were answered. The patient agreed with the plan and demonstrated an understanding of the instructions. The patient was advised to call back or seek an in-person evaluation if the symptoms worsen or if the condition fails to improve as anticipated.   I provided 15 minutes of face-to-face Doximity time during this encounter.    Rulon Eisenmenger, MD 03/13/2019   I, Molly Dorshimer, am acting as scribe for Nicholas Lose, MD.  I have reviewed the above documentation for accuracy and completeness, and I agree with the above.

## 2019-03-13 ENCOUNTER — Inpatient Hospital Stay: Payer: BC Managed Care – PPO | Attending: Hematology and Oncology | Admitting: Hematology and Oncology

## 2019-03-13 DIAGNOSIS — C50211 Malignant neoplasm of upper-inner quadrant of right female breast: Secondary | ICD-10-CM | POA: Diagnosis not present

## 2019-03-13 DIAGNOSIS — Z170 Estrogen receptor positive status [ER+]: Secondary | ICD-10-CM | POA: Diagnosis not present

## 2019-03-13 DIAGNOSIS — Z9221 Personal history of antineoplastic chemotherapy: Secondary | ICD-10-CM

## 2019-03-13 DIAGNOSIS — Z5112 Encounter for antineoplastic immunotherapy: Secondary | ICD-10-CM | POA: Insufficient documentation

## 2019-03-13 MED ORDER — LIDOCAINE-PRILOCAINE 2.5-2.5 % EX CREA: TOPICAL_CREAM | CUTANEOUS | 3 refills | Status: DC

## 2019-03-13 NOTE — Progress Notes (Signed)
DISCONTINUE OFF PATHWAY REGIMEN - Breast   [id number]:Paclitaxel + Trastuzumab:   A cycle is every 28 days:     Paclitaxel      Trastuzumab-xxxx      Trastuzumab-xxxx   **Always confirm dose/schedule in your pharmacy ordering system**  REASON: Other Reason PRIOR TREATMENT: Off Pathway: Paclitaxel + Trastuzumab TREATMENT RESPONSE: Partial Response (PR)  START ON PATHWAY REGIMEN - Breast     A cycle is every 21 days:     Ado-trastuzumab emtansine   **Always confirm dose/schedule in your pharmacy ordering system**  Patient Characteristics: Post-Neoadjuvant Therapy and Resection, HER2 Positive, ER Positive, Residual Disease, Adjuvant Targeted Therapy After Neoadjuvant Chemo/Targeted Therapy Therapeutic Status: Post-Neoadjuvant Therapy and Resection ER Status: Positive (+) HER2 Status: Positive (+) PR Status: Negative (-) Residual Invasive Disease Post-Neoadjuvant Therapy<= Yes Intent of Therapy: Curative Intent, Discussed with Patient

## 2019-03-13 NOTE — Assessment & Plan Note (Addendum)
09/27/19:Two right breast masses 12:00 to 1 o'clock position 1.3 cm and 1.2 cm, they are 1.5 cm apart. 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 Neoadjuvant chemotherapy withTaxol Herceptin weekly x12 completed 4/16/2020followed by Herceptin maintenance vsKadcylafor 1 year 2.  03/08/2019: Right lumpectomy IDC s/p neoadjuvant treatment, grade 2, 0.7cm, ER+ 100%, PR+ 5%, HER2 equivocal, Ki67 20%, 0/2 right axillary sentinel lymph nodes negative for carcinoma, with clear margins.  3. Followed by adjuvant radiation therapy 4.Followed by antiestrogen therapy ------------------------------------------------------------------------------------------------------------------------------------------------------------ Treatment plan: Because there was small amount of residual disease, I recommended switching from Herceptin maintenance to Kadcyla maintenance.  Pathology counseling: I discussed the final pathology report of the patient provided  a copy of this report. I discussed the margins as well as lymph node surgeries. We also discussed the final staging along with previously performed ER/PR and HER-2/neu testing.  Return to clinic to start [place] on 03/26/2019

## 2019-03-22 NOTE — Progress Notes (Signed)
Location of Breast Cancer: Right Breast  Histology per Pathology Report:  09/26/18 Diagnosis 1. Breast, right, needle core biopsy, 12:30 o'clock, 5 cm fn - INVASIVE DUCTAL CARCINOMA, GRADE II/III. - SEE MICROSCOPIC DESCRIPTION.  Receptor Status: ER (70%), PR (NEG), Her2-neu (POS), Ki (20%)  2. Breast, right, needle core biopsy, 1:30 o'clock, 6 cm fn - INVASIVE DUCTAL CARCINOMA, GRADE II/III. - SEE MICROSCOPIC DESCRIPTION.  Receptor Status: ER(100%), PR (20%), Her2-neu (POS), Ki-(10%)  Did patient present with symptoms or was this found on screening mammography?: It was found on a screening mammogram.   Past/Anticipated interventions by surgeon, if any: Dr. [name] PAC insertion 11/07/18.  03/08/19 PROCEDURE:  Procedure(s): RIGHT BREAST RADIOACTIVE SEED LOCALIZED LUMPECTOMY X2(3 SEEDS) AND RIGHT DEEP AXILLARY SENTINEL LYMPH NODE BIOPSY WITH INJECTION BLUE DYE (Right) SURGEON:  Surgeon(s) and Role:    Jovita Kussmaul, MD - Primary   Past/Anticipated interventions by medical oncology, if any:  03/13/19 Dr. Lindi Adie Treatment plan: 1. Neoadjuvant chemotherapy withTaxol Herceptin weekly x12completed 4/16/2020followed by Herceptin maintenance vsKadcylafor 1 year 2.  03/08/2019: Right lumpectomy IDC s/p neoadjuvant treatment, grade 2, 0.7cm, ER+ 100%, PR+ 5%, HER2 equivocal, Ki67 20%, 0/2 right axillary sentinel lymph nodes negative for carcinoma, with clear margins.  3. Followed by adjuvant radiation therapy 4.Followed by antiestrogen therapy ------------------------------------------------------------------------------------------------------------------------------------------------------------ Treatment plan: Because there was small amount of residual disease, I recommended switching from Herceptin maintenance to Kadcyla maintenance.  Return to clinic to start [place] on 03/26/2019 Rulon Eisenmenger, MD 03/13/2019   Lymphedema issues, if any:  She denies. She has good  arm mobility.   Pain issues, if any:  She is sore at her surgery site.   SAFETY ISSUES:  Prior radiation? No  Pacemaker/ICD? No  Possible current pregnancy? No  Is the patient on methotrexate? No  Current Complaints / other details:   BP 139/77 (BP Location: Left Arm, Patient Position: Sitting)   Pulse 74   Temp 98 F (36.7 C) (Oral)   Resp (!) 2   Ht '5\' 5"'$  (1.651 m)   Wt 184 lb 9.6 oz (83.7 kg)   SpO2 100%   BMI 30.72 kg/m    Wt Readings from Last 3 Encounters:  03/29/19 184 lb 9.6 oz (83.7 kg)  03/28/19 182 lb 12.8 oz (82.9 kg)  03/27/19 183 lb (83 kg)

## 2019-03-22 NOTE — Assessment & Plan Note (Signed)
09/27/19:Two right breast masses 12:00 to 1 o'clock position 1.3 cm and 1.2 cm, they are 1.5 cm apart. 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 Neoadjuvant chemotherapy withTaxol Herceptin weekly x12completed 4/16/2020followed by Herceptin maintenance vsKadcylafor 1 year 2.  03/08/2019: Right lumpectomy IDC s/p neoadjuvant treatment, grade 2, 0.7cm, ER+ 100%, PR+ 5%, HER2 equivocal, Ki67 20%, 0/2 right axillary sentinel lymph nodes negative for carcinoma, with clear margins.  3. Followed by adjuvant radiation therapy 4.Followed by antiestrogen therapy ------------------------------------------------------------------------------------------------------------------------------------------------------------ Treatment plan: Kadcyla maintenance therapy started 03/28/2019 We discussed risks and benefits of Kadcyla. Return to clinic every 3 weeks for Kadcyla and follow-up with me.

## 2019-03-27 ENCOUNTER — Other Ambulatory Visit: Payer: Self-pay

## 2019-03-27 ENCOUNTER — Ambulatory Visit (HOSPITAL_COMMUNITY)
Admission: RE | Admit: 2019-03-27 | Discharge: 2019-03-27 | Disposition: A | Discharge status: Home / Self Care | Payer: BC Managed Care – PPO | Source: Ambulatory Visit | Attending: Internal Medicine | Admitting: Internal Medicine

## 2019-03-27 ENCOUNTER — Ambulatory Visit (HOSPITAL_BASED_OUTPATIENT_CLINIC_OR_DEPARTMENT_OTHER)
Admission: RE | Admit: 2019-03-27 | Discharge: 2019-03-27 | Disposition: A | Discharge status: Home / Self Care | Payer: BC Managed Care – PPO | Source: Ambulatory Visit | Attending: Internal Medicine | Admitting: Internal Medicine

## 2019-03-27 ENCOUNTER — Encounter (HOSPITAL_COMMUNITY): Payer: Self-pay | Admitting: Internal Medicine

## 2019-03-27 VITALS — BP 136/86 | HR 65 | Wt 183.0 lb

## 2019-03-27 DIAGNOSIS — Z9221 Personal history of antineoplastic chemotherapy: Secondary | ICD-10-CM | POA: Insufficient documentation

## 2019-03-27 DIAGNOSIS — Z825 Family history of asthma and other chronic lower respiratory diseases: Secondary | ICD-10-CM | POA: Insufficient documentation

## 2019-03-27 DIAGNOSIS — C50211 Malignant neoplasm of upper-inner quadrant of right female breast: Secondary | ICD-10-CM | POA: Insufficient documentation

## 2019-03-27 DIAGNOSIS — Z8052 Family history of malignant neoplasm of bladder: Secondary | ICD-10-CM | POA: Insufficient documentation

## 2019-03-27 DIAGNOSIS — Z7951 Long term (current) use of inhaled steroids: Secondary | ICD-10-CM | POA: Insufficient documentation

## 2019-03-27 DIAGNOSIS — Z170 Estrogen receptor positive status [ER+]: Secondary | ICD-10-CM | POA: Insufficient documentation

## 2019-03-27 DIAGNOSIS — Z888 Allergy status to other drugs, medicaments and biological substances status: Secondary | ICD-10-CM | POA: Insufficient documentation

## 2019-03-27 DIAGNOSIS — Z8249 Family history of ischemic heart disease and other diseases of the circulatory system: Secondary | ICD-10-CM | POA: Diagnosis not present

## 2019-03-27 DIAGNOSIS — Z833 Family history of diabetes mellitus: Secondary | ICD-10-CM | POA: Insufficient documentation

## 2019-03-27 NOTE — Patient Instructions (Signed)
No medication changes today.  Your physician recommends that you schedule a follow-up appointment in: 3 months with an Echo  Your physician has requested that you have an echocardiogram. Echocardiography is a painless test that uses sound waves to create images of your heart. It provides your doctor with information about the size and shape of your heart and how well your heart's chambers and valves are working. This procedure takes approximately one hour. There are no restrictions for this procedure.  At the [facility], you and your health needs are our priority. As part of our continuing mission to provide you with exceptional heart care, we have created designated Provider Care Teams. These Care Teams include your primary Cardiologist (physician) and Advanced Practice Providers (APPs- Physician Assistants and Nurse Practitioners) who all work together to provide you with the care you need, when you need it.   You may see any of the following providers on your designated Care Team at your next follow up: Marland Kitchen Dr Glori Bickers . Dr Loralie Champagne . Darrick Grinder, NP

## 2019-03-27 NOTE — Addendum Note (Signed)
Encounter addended by: Valeda Malm, RN on: 03/27/2019 12:36 PM  Actions taken: Order list changed, Diagnosis association updated

## 2019-03-27 NOTE — Progress Notes (Signed)
Patient Care Team: Chevis Pretty, FNP as PCP - General (Nurse Practitioner) Mauro Kaufmann, RN as Oncology Nurse Navigator Rockwell Germany, RN as Oncology Nurse Navigator  DIAGNOSIS:    ICD-10-CM   1. Malignant neoplasm of upper-inner quadrant of right breast in female, estrogen receptor positive ([place])  C50.211    Z17.0     SUMMARY OF ONCOLOGIC HISTORY: Oncology History  Malignant neoplasm of upper-inner quadrant of right breast in female, estrogen receptor positive ([place])  09/26/2018 Initial Diagnosis   Two right breast masses 12:00 to 1 o'clock position 1.3 cm and 1.2 cm, they are 1.5 cm apart.  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 The Hereditary Gene Panel offered by [facility] includes sequencing and/or deletion duplication testing of the following 48 genes: APC, ATM, AXIN2, BARD1, BMPR1A, BRCA1, BRCA2, BRIP1, CDH1, CDK4, CDKN2A (p14ARF), CDKN2A (p16INK4a), CHEK2, CTNNA1, DICER1, EPCAM (Deletion/duplication testing only), GREM1 (promoter region deletion/duplication testing only), KIT, MEN1, MLH1, MSH2, MSH3, MSH6, MUTYH, NBN, NF1, NHTL1, PALB2, PDGFRA, PMS2, POLD1, POLE, PTEN, RAD50, RAD51C, RAD51D, RNF43, SDHB, SDHC, SDHD, SMAD4, SMARCA4. STK11, TP53, TSC1, TSC2, and VHL.  The following genes were evaluated for sequence changes only: SDHA and HOXB13 c.251G>A variant only. The  report date is January 09, 2019.   03/08/2019 Surgery   Right lumpectomy [name]): IDC s/p neoadjuvant treatment, grade 2, 0.7cm, ER+ 100%, PR+ 5%, HER2 equivocal, Ki67 20%, 0/2 right axillary sentinel lymph nodes negative for carcinoma, with clear margins.    03/28/2019 -  Chemotherapy   The patient had ado-trastuzumab emtansine (KADCYLA) 300 mg in sodium chloride 0.9 % 250 mL chemo infusion, 3.6 mg/kg = 300 mg, Intravenous, Once, 0 of 12 cycles  for chemotherapy treatment.      CHIEF COMPLIANT: Kadcyla maintenance  INTERVAL HISTORY: Judith Thomas is a 63 y.o. with above-mentioned history of right breast cancer treated with neoadjuvant chemotherapy and lumpectomy. She is a participant in the [employer]Beat clinical trial. ECHO on 03/27/19 revealed an ejection fraction in the range of 60-65%. She presents to the clinic today to begin Kadcyla maintenance.  Other than mild fatigue she is doing very well.  Recovered very well from prior surgery.  No longer taking any pain medication.  REVIEW OF SYSTEMS:   Constitutional: Denies fevers, chills or abnormal weight loss Eyes: Denies blurriness of vision Ears, nose, mouth, throat, and face: Denies mucositis or sore throat Respiratory: Denies cough, dyspnea or wheezes Cardiovascular: Denies palpitation, chest discomfort Gastrointestinal: Denies nausea, heartburn or change in bowel habits Skin: Denies abnormal skin rashes Lymphatics: Denies new lymphadenopathy or easy bruising Neurological: Denies numbness, tingling or new weaknesses Behavioral/Psych: Mood is stable, no new changes  Extremities: No lower extremity edema Breast: Prior right lumpectomy All other systems were reviewed with the patient and are negative.  I have reviewed the past medical history, past surgical history, social  history and family history with the patient and they are unchanged from previous note.  ALLERGIES:  is allergic to paclitaxel.  MEDICATIONS:  Current Outpatient  Medications  Medication Sig Dispense Refill   fluticasone (FLONASE) 50 MCG/ACT nasal spray Place 1 spray into both nostrils 2 (two) times daily. 16 g 2   lidocaine-prilocaine (EMLA) cream Apply to affected area once 30 g 3   loratadine (CLARITIN) 10 MG tablet Take 10 mg by mouth daily.     Multiple Vitamin (MULTIVITAMIN) tablet Take 1 tablet by mouth daily.      No current facility-administered medications for this visit.     PHYSICAL EXAMINATION: ECOG PERFORMANCE STATUS: 1 - Symptomatic but completely ambulatory  Vitals:   03/28/19 0929  BP: 137/76  Pulse: 68  Resp: 17  Temp: 98.2 F (36.8 C)  SpO2: 98%   Filed Weights   03/28/19 0929  Weight: 182 lb 12.8 oz (82.9 kg)    Physical exam not done due to COVID-19 precautions. LABORATORY DATA:  I have reviewed the data as listed CMP Latest Ref Rng & Units 03/05/2019 01/24/2019 01/17/2019  Glucose 70 - 99 mg/dL 83 102(H) 91  BUN 8 - 23 mg/dL _0 Creatinine 0.44 - 1.00 mg/dL 0.73 0.73 0.74  Sodium 135 - 145 mmol/L 141 140 140  Potassium 3.5 - 5.1 mmol/L 4.3 4.4 4.6  Chloride 98 - 111 mmol/L 107 107 106  CO2 22 - 32 mmol/L _1 Calcium 8.9 - 10.3 mg/dL 9.0 8.5(L) 8.8(L)  Total Protein 6.5 - 8.1 g/dL 6.9 6.8 6.8  Total Bilirubin 0.3 - 1.2 mg/dL 0.5 0.5 0.5  Alkaline Phos 38 - 126 U/L 92 106 99  AST 15 - 41 U/L 13(L) 16 13(L)  ALT 0 - 44 U/L _2 Lab Results  Component Value Date   WBC 5.5 03/28/2019   HGB 11.4 (L) 03/28/2019   HCT 36.3 03/28/2019   MCV 99.5 03/28/2019   PLT 293 03/28/2019   NEUTROABS 3.5 03/28/2019    ASSESSMENT & PLAN:  Malignant neoplasm of upper-inner quadrant of right breast in female, estrogen receptor positive ([place]) 09/27/19:Two right breast masses 12:00 to 1 o'clock position 1.3 cm and 1.2 cm, they are 1.5 cm apart. Biopsy revealed grade 2 invasive ductal carcinoma ER 70%- 100%, PR 0% -20%, Ki-67 20%, HER-2 3+ by IHC and FISH ratio 2.15 with a gene copy number of 4.2 for  the tumor that was 2+ by IHC, T1c N0 stage Ia  Treatment plan: 1. Neoadjuvant chemotherapy withTaxol Herceptin weekly x12completed 4/16/2020followed by Herceptin maintenance vsKadcylafor 1 year 2.  03/08/2019: Right lumpectomy IDC s/p neoadjuvant treatment, grade 2, 0.7cm, ER+ 100%, PR+ 5%, HER2 equivocal, Ki67 20%, 0/2 right axillary sentinel lymph nodes negative for carcinoma, with clear margins.  3. Followed by adjuvant radiation therapy 4.Followed by antiestrogen therapy ------------------------------------------------------------------------------------------------------------------------------------------------------------ Treatment plan: Kadcyla maintenance therapy started 03/28/2019 We discussed risks and benefits of Kadcyla. Patient has appointments to see radiation oncology tomorrow. Return to clinic every 3 weeks for Kadcyla and follow-up with me.   No orders of the defined types were placed in this encounter.  The patient has a good understanding of the overall plan. she agrees with it. she will call with any problems that may develop before the next visit here.  Nicholas Lose, MD 03/28/2019  Julious Oka Dorshimer am acting as scribe for Dr. Nicholas Lose.  I have reviewed the above documentation for accuracy and completeness, and  I agree with the above.

## 2019-03-27 NOTE — Progress Notes (Signed)
Cardio-Oncology Clinic Consult Note   Referring Physician: Dr Lindi Adie Primary Care: Chevis Pretty, FNP Primary Cardiologist: Dr Haroldine Laws  HPI:  Judith Thomas is a 63 y.o. female with no past medical history who has been referred by Dr. Lindi Adie to establish in the cardio-oncology clinic for monitoring of cardio-toxicity while undergoing chemotherapy.    Malignant neoplasm of upper-inner quadrant of right breast in female, estrogen receptor positive ([place])   09/26/2018 Initial Diagnosis    Two right breast masses 12:00 to 1 o'clock position 1.3 cm and 1.2 cm, they are 1.5 cm apart.  Biopsy revealed grade 2 invasive ductal carcinoma ER 70%- 100%, PR 0% -20%, Ki-67 20%, HER-2 3+ by IHC and FISH ratio 2.15 with a gene copy number of 4.2 for the tumor that was 2+ by IHC, T1c N0 stage Ia    10/24/2018 Cancer Staging    Staging form: Breast, AJCC 8th Edition - Clinical stage from 10/24/2018: Stage IA (cT1c(2), cN0, cM0, G2, ER+, PR+, HER2+) - Signed by Nicholas Lose, MD on 10/24/2018    11/08/2018 -  Neo-Adjuvant Chemotherapy    Neoadjuvant chemotherapy withTaxol Herceptin weekly x57fllowed by Herceptin maintenance vsKadcylafor 1 year    01/09/2019 Genetic Testing    Negative genetic testing on the common hereditary cancer panel. The Hereditary Gene Panel offered by [facility] includes sequencing and/or deletion duplication testing of the following 48 genes: APC, ATM, AXIN2, BARD1, BMPR1A, BRCA1, BRCA2, BRIP1, CDH1, CDK4, CDKN2A (p14ARF), CDKN2A (p16INK4a), CHEK2, CTNNA1, DICER1, EPCAM (Deletion/duplication testing only), GREM1 (promoter region deletion/duplication testing only), KIT, MEN1, MLH1, MSH2, MSH3, MSH6, MUTYH, NBN, NF1, NHTL1, PALB2, PDGFRA, PMS2, POLD1, POLE, PTEN, RAD50, RAD51C, RAD51D, RNF43, SDHB, SDHC, SDHD, SMAD4, SMARCA4. STK11, TP53, TSC1, TSC2, and VHL.  The following genes were evaluated for sequence changes only: SDHA and HOXB13 c.251G>A variant only. The  report date is January 09, 2019.    03/08/2019 Surgery    Right lumpectomy ([name]: IDC s/p neoadjuvant treatment, grade 2, 0.7cm, ER+ 100%, PR+ 5%, HER2 equivocal, Ki67 20%, 0/2 right axillary sentinel lymph nodes negative for carcinoma, with clear margins.      Treatment Plan  1. Neoadjuvant chemotherapy with Taxol Herceptin weekly x12 followed by Herceptin maintenance vs Kadcyla for 1 year 2. Followed by adjuvant radiation therapy  3.  Followed by antiestrogen therapy  Echo 1/20  EF 60-65% GLS -19.9%   Echo today EF 60-65% Grade 2 DD GLS -19.5% Personally reviewed   Had lumpectomy in May and finished chemo 01/24/19. Remains on Herceptin. Will now add Kadcyla. Denies any known medical problems. No h/o heart disease. Active without any CP or SOB. Walking regularly 1.5-2 miles per day   PMHx - no major PHMHx  Current Outpatient Medications  Medication Sig Dispense Refill  . fluticasone (FLONASE) 50 MCG/ACT nasal spray Place 1 spray into both nostrils 2 (two) times daily. 16 g 2  . lidocaine-prilocaine (EMLA) cream Apply to affected area once 30 g 3  . loratadine (CLARITIN) 10 MG tablet Take 10 mg by mouth daily.    . Multiple Vitamin (MULTIVITAMIN) tablet Take 1 tablet by mouth daily.      No current facility-administered medications for this encounter.     Allergies  Allergen Reactions  . Paclitaxel Other (See Comments)    Nausea, flushing      Social History   Socioeconomic History  . Marital status: Married    Spouse name: michael  . Number of children: Not on file  . Years of education: Not on  file  . Highest education level: Not on file  Occupational History  . Not on file  Social Needs  . Financial resource strain: Not on file  . Food insecurity    Worry: Not on file    Inability: Not on file  . Transportation needs    Medical: No    Non-medical: No  Tobacco Use  . Smoking status: Never Smoker  . Smokeless tobacco: Never Used  Substance and Sexual  Activity  . Alcohol use: Yes    Alcohol/week: 1.0 standard drinks    Types: 1 Standard drinks or equivalent per week  . Drug use: No  . Sexual activity: Not on file  Lifestyle  . Physical activity    Days per week: Not on file    Minutes per session: Not on file  . Stress: Not on file  Relationships  . Social Herbalist on phone: Not on file    Gets together: Not on file    Attends religious service: Not on file    Active member of club or organization: Not on file    Attends meetings of clubs or organizations: Not on file    Relationship status: Not on file  . Intimate partner violence    Fear of current or ex partner: No    Emotionally abused: No    Physically abused: No    Forced sexual activity: No  Other Topics Concern  . Not on file  Social History Narrative  . Not on file      Family History  Problem Relation Age of Onset  . Liver disease Mother   . Heart disease Mother        afib  . Heart disease Father   . Asthma Father   . Diabetes Father   . Bladder Cancer Father        smoker  . Breast cancer Maternal Aunt   . Heart disease Maternal Grandfather   . Kidney disease Paternal Grandmother   . Heart disease Paternal Grandfather   . Heart disease Other   . Breast cancer Maternal Aunt        mets to bone    Vitals:   03/27/19 1158  BP: 136/86  Pulse: 65  SpO2: 98%  Weight: 83 kg (183 lb)     PHYSICAL EXAM: General:  Well appearing. No respiratory difficulty HEENT: normal Neck: supple. no JVD. Carotids 2+ bilat; no bruits. No lymphadenopathy or thyromegaly appreciated. Cor: PMI nondisplaced. Regular rate & rhythm. No rubs, gallops or murmurs. Lungs: clear Abdomen: soft, nontender, nondistended. No hepatosplenomegaly. No bruits or masses. Good bowel sounds. Extremities: no cyanosis, clubbing, rash, edema Neuro: alert & oriented x 3, cranial nerves grossly intact. moves all 4 extremities w/o difficulty. Affect pleasant.   ASSESSMENT &  PLAN:  1. Malignant neoplasm of upper-inner quadrant of right breast in female, estrogen receptor positive ([place]) - Echo 1/20 shows EF 60-65% GLS -19.9% - She started chemo + Hercetpin on 11/08/2018 - Echo today EF 60-65% Grade 2 DD GLS -19.5%  - I reviewed echos personally. EF and Doppler parameters stable. No HF on exam. Continue Herceptin.    Glori Bickers, MD  12:14 PM

## 2019-03-27 NOTE — Progress Notes (Signed)
  Echocardiogram 2D Echocardiogram has been performed.  Judith Thomas 03/27/2019, 11:54 AM

## 2019-03-27 NOTE — Addendum Note (Signed)
Encounter addended by: Valeda Malm, RN on: 03/27/2019 12:33 PM  Actions taken: Clinical Note Signed

## 2019-03-28 ENCOUNTER — Inpatient Hospital Stay: Payer: BC Managed Care – PPO

## 2019-03-28 ENCOUNTER — Inpatient Hospital Stay (HOSPITAL_BASED_OUTPATIENT_CLINIC_OR_DEPARTMENT_OTHER): Payer: BC Managed Care – PPO | Admitting: Hematology and Oncology

## 2019-03-28 ENCOUNTER — Other Ambulatory Visit: Payer: Self-pay

## 2019-03-28 VITALS — BP 125/73 | HR 65 | Temp 97.8°F | Resp 18

## 2019-03-28 DIAGNOSIS — C50211 Malignant neoplasm of upper-inner quadrant of right female breast: Secondary | ICD-10-CM

## 2019-03-28 DIAGNOSIS — Z170 Estrogen receptor positive status [ER+]: Secondary | ICD-10-CM

## 2019-03-28 DIAGNOSIS — Z5112 Encounter for antineoplastic immunotherapy: Secondary | ICD-10-CM | POA: Diagnosis not present

## 2019-03-28 DIAGNOSIS — Z95828 Presence of other vascular implants and grafts: Secondary | ICD-10-CM

## 2019-03-28 LAB — CBC WITH DIFFERENTIAL (CANCER CENTER ONLY)
Abs Immature Granulocytes: 0.01 10*3/uL (ref 0.00–0.07)
Basophils Absolute: 0 10*3/uL (ref 0.0–0.1)
Basophils Relative: 1 %
Eosinophils Absolute: 0.2 10*3/uL (ref 0.0–0.5)
Eosinophils Relative: 3 %
HCT: 36.3 % (ref 36.0–46.0)
Hemoglobin: 11.4 g/dL — ABNORMAL LOW (ref 12.0–15.0)
Immature Granulocytes: 0 %
Lymphocytes Relative: 25 %
Lymphs Abs: 1.4 10*3/uL (ref 0.7–4.0)
MCH: 31.2 pg (ref 26.0–34.0)
MCHC: 31.4 g/dL (ref 30.0–36.0)
MCV: 99.5 fL (ref 80.0–100.0)
Monocytes Absolute: 0.4 10*3/uL (ref 0.1–1.0)
Monocytes Relative: 8 %
Neutro Abs: 3.5 10*3/uL (ref 1.7–7.7)
Neutrophils Relative %: 63 %
Platelet Count: 293 10*3/uL (ref 150–400)
RBC: 3.65 MIL/uL — ABNORMAL LOW (ref 3.87–5.11)
RDW: 13.8 % (ref 11.5–15.5)
WBC Count: 5.5 10*3/uL (ref 4.0–10.5)
nRBC: 0 % (ref 0.0–0.2)

## 2019-03-28 LAB — CMP (CANCER CENTER ONLY)
ALT: 12 U/L (ref 0–44)
AST: 12 U/L — ABNORMAL LOW (ref 15–41)
Albumin: 3.8 g/dL (ref 3.5–5.0)
Alkaline Phosphatase: 107 U/L (ref 38–126)
Anion gap: 7 (ref 5–15)
BUN: 15 mg/dL (ref 8–23)
CO2: 27 mmol/L (ref 22–32)
Calcium: 9.4 mg/dL (ref 8.9–10.3)
Chloride: 108 mmol/L (ref 98–111)
Creatinine: 0.79 mg/dL (ref 0.44–1.00)
GFR, Est AFR Am: >60 mL/min (ref >60)
GFR, Estimated: >60 mL/min (ref >60)
Glucose, Bld: 91 mg/dL (ref 70–99)
Potassium: 4.5 mmol/L (ref 3.5–5.1)
Sodium: 142 mmol/L (ref 135–145)
Total Bilirubin: 0.5 mg/dL (ref 0.3–1.2)
Total Protein: 6.9 g/dL (ref 6.5–8.1)

## 2019-03-28 MED ORDER — SODIUM CHLORIDE 0.9% FLUSH
10.0000 mL | INTRAVENOUS | Status: DC | PRN
  Administered 2019-03-28: 10 mL
  Filled 2019-03-28: qty 10

## 2019-03-28 MED ORDER — ACETAMINOPHEN 325 MG PO TABS
ORAL_TABLET | ORAL | Status: AC
  Filled 2019-03-28: qty 2

## 2019-03-28 MED ORDER — SODIUM CHLORIDE 0.9% FLUSH
10.0000 mL | Freq: Once | INTRAVENOUS | Status: AC
  Administered 2019-03-28: 10 mL
  Filled 2019-03-28: qty 10

## 2019-03-28 MED ORDER — SODIUM CHLORIDE 0.9 % IV SOLN
3.6000 mg/kg | Freq: Once | INTRAVENOUS | Status: AC
  Administered 2019-03-28: 300 mg via INTRAVENOUS
  Filled 2019-03-28: qty 15

## 2019-03-28 MED ORDER — ACETAMINOPHEN 325 MG PO TABS
650.0000 mg | ORAL_TABLET | Freq: Once | ORAL | Status: AC
  Administered 2019-03-28: 650 mg via ORAL

## 2019-03-28 MED ORDER — DIPHENHYDRAMINE HCL 25 MG PO CAPS
ORAL_CAPSULE | ORAL | Status: AC
  Filled 2019-03-28: qty 2

## 2019-03-28 MED ORDER — DIPHENHYDRAMINE HCL 25 MG PO CAPS
50.0000 mg | ORAL_CAPSULE | Freq: Once | ORAL | Status: AC
  Administered 2019-03-28: 50 mg via ORAL

## 2019-03-28 MED ORDER — SODIUM CHLORIDE 0.9 % IV SOLN
Freq: Once | INTRAVENOUS | Status: AC
  Administered 2019-03-28: 11:00:00 via INTRAVENOUS
  Filled 2019-03-28: qty 250

## 2019-03-28 MED ORDER — HEPARIN SOD (PORK) LOCK FLUSH 100 UNIT/ML IV SOLN
500.0000 [IU] | Freq: Once | INTRAVENOUS | Status: AC | PRN
  Administered 2019-03-28: 500 [IU]
  Filled 2019-03-28: qty 5

## 2019-03-28 NOTE — Patient Instructions (Signed)
[facility] Discharge Instructions for Patients Receiving Chemotherapy  Today you received the following chemotherapy agents: Kadcyla  To help prevent nausea and vomiting after your treatment, we encourage you to take your nausea medication as prescribed.   If you develop nausea and vomiting that is not controlled by your nausea medication, call the clinic.   BELOW ARE SYMPTOMS THAT SHOULD BE REPORTED IMMEDIATELY:  *FEVER GREATER THAN 100.5 F  *CHILLS WITH OR WITHOUT FEVER  NAUSEA AND VOMITING THAT IS NOT CONTROLLED WITH YOUR NAUSEA MEDICATION  *UNUSUAL SHORTNESS OF BREATH  *UNUSUAL BRUISING OR BLEEDING  TENDERNESS IN MOUTH AND THROAT WITH OR WITHOUT PRESENCE OF ULCERS  *URINARY PROBLEMS  *BOWEL PROBLEMS  UNUSUAL RASH Items with * indicate a potential emergency and should be followed up as soon as possible.  Feel free to call the clinic should you have any questions or concerns. The clinic phone number is [phone].  Please show the [facility] at check-in to the Emergency Department and triage nurse.  Ado-Trastuzumab Emtansine for injection (Kadcyla) What is this medicine? ADO-TRASTUZUMAB EMTANSINE (ADD oh traz TOO zuh mab em TAN zine) is a monoclonal antibody combined with chemotherapy. It is used to treat breast cancer. This medicine may be used for other purposes; ask your health care provider or pharmacist if you have questions. COMMON BRAND NAME(S): Kadcyla What should I tell my health care provider before I take this medicine? They need to know if you have any of these conditions: -heart disease -heart failure -infection (especially a virus infection such as chickenpox, cold sores, or herpes) -liver disease -lung or breathing disease, like asthma -an unusual or allergic reaction to ado-trastuzumab emtansine, other medications, foods, dyes, or preservatives -pregnant or trying to get pregnant -breast-feeding How should I use this  medicine? This medicine is for infusion into a vein. It is given by a health care professional in a hospital or clinic setting. Talk to your pediatrician regarding the use of this medicine in children. Special care may be needed. Overdosage: If you think you have taken too much of this medicine contact a poison control center or emergency room at once. NOTE: This medicine is only for you. Do not share this medicine with others. What if I miss a dose? It is important not to miss your dose. Call your doctor or health care professional if you are unable to keep an appointment. What may interact with this medicine? This medicine may also interact with the following medications: -atazanavir -boceprevir -clarithromycin -delavirdine -indinavir -dalfopristin; quinupristin -isoniazid, INH -itraconazole -ketoconazole -nefazodone -nelfinavir -ritonavir -telaprevir -telithromycin -tipranavir -voriconazole This list may not describe all possible interactions. Give your health care provider a list of all the medicines, herbs, non-prescription drugs, or dietary supplements you use. Also tell them if you smoke, drink alcohol, or use illegal drugs. Some items may interact with your medicine. What should I watch for while using this medicine? Visit your doctor for checks on your progress. This drug may make you feel generally unwell. This is not uncommon, as chemotherapy can affect healthy cells as well as cancer cells. Report any side effects. Continue your course of treatment even though you feel ill unless your doctor tells you to stop. You may need blood work done while you are taking this medicine. Call your doctor or health care professional for advice if you get a fever, chills or sore throat, or other symptoms of a cold or flu. Do not treat yourself. This drug decreases your  body's ability to fight infections. Try to avoid being around people who are sick. Be careful brushing and flossing your  teeth or using a toothpick because you may get an infection or bleed more easily. If you have any dental work done, tell your dentist you are receiving this medicine. Avoid taking products that contain aspirin, acetaminophen, ibuprofen, naproxen, or ketoprofen unless instructed by your doctor. These medicines may hide a fever. Do not become pregnant while taking this medicine or for 7 months after stopping it, men with female partners should use contraception during treatment and for 4 months after the last dose. Women should inform their doctor if they wish to become pregnant or think they might be pregnant. There is a potential for serious side effects to an unborn child. Do not breast-feed an infant while taking this medicine or for 7 months after the last dose. Men who have a partner who is pregnant or who is capable of becoming pregnant should use a condom during sexual activity while taking this medicine and for 4 months after stopping it. Men should inform their doctors if they wish to father a child. This medicine may lower sperm counts. Talk to your health care professional or pharmacist for more information. What side effects may I notice from receiving this medicine? Side effects that you should report to your doctor or health care professional as soon as possible: -allergic reactions like skin rash, itching or hives, swelling of the face, lips, or tongue -breathing problems -chest pain or palpitations -fever or chills, sore throat -general ill feeling or flu-like symptoms -light-colored stools -nausea, vomiting -pain, tingling, numbness in the hands or feet -signs and symptoms of bleeding such as bloody or black, tarry stools; red or dark-brown urine; spitting up blood or brown material that looks like coffee grounds; red spots on the skin; unusual bruising or bleeding from the eye, gums, or nose -swelling of the legs or ankles -yellowing of the eyes or skin Side effects that usually do  not require medical attention (report to your doctor or health care professional if they continue or are bothersome): -changes in taste -constipation -dizziness -headache -joint pain -muscle pain -trouble sleeping -unusually weak or tired This list may not describe all possible side effects. Call your doctor for medical advice about side effects. You may report side effects to FDA at 1-800-FDA-1088. Where should I keep my medicine? This drug is given in a hospital or clinic and will not be stored at home. NOTE: This sheet is a summary. It may not cover all possible information. If you have questions about this medicine, talk to your doctor, pharmacist, or health care provider.  2019 Elsevier/Gold Standard (2015-11-16 12:11:06)

## 2019-03-28 NOTE — Progress Notes (Signed)
Radiation Oncology         [phone] ________________________________  Name: Judith Thomas MRN: [id number]  Date: 03/29/2019  DOB: 10/01/1956  Follow-Up Visit Note  Outpatient  CC: Martin, [name], FNP  Martin, [name], *  Diagnosis:      ICD-10-CM   1. Malignant neoplasm of upper-inner quadrant of right breast in female, estrogen receptor positive ([facility])  C50.211    Z17.0      Cancer Staging Malignant neoplasm of upper-inner quadrant of right breast in female, estrogen receptor positive ([facility]) Staging form: Breast, AJCC 8th Edition - Clinical stage from 10/24/2018: Stage IA (cT1c(2), cN0, cM0, G2, ER+, PR+, HER2+) - Signed by Gudena, Vinay, MD on 10/24/2018 - Pathologic stage from 03/29/2019: No Stage Recommended (ypT1b, pN0, cM0, G2, ER+, PR+, HER2: Equivocal) - Signed by , , MD on 03/30/2019   CHIEF COMPLAINT: Here to discuss management of right breast cancer  Narrative:  The patient returns today for follow-up to discuss radiation treatment options.     Since consultation date, she underwent bilateral breast MRI on 10/28/2018 revealing: two adjacent biopsy-proven invasive carcinomas within the upper-inner quadrant of the right breast, middle to posterior depth, both measuring 1.3 cm, together measuring 3.7 cm; small associated satellite lesions located slightly anterior and anterior-inferior to the biopsy-proven carcinomas in the right breast, largest measuring 5-7 mm in size; no evidence of malignancy within the left breast; no enlarged or morphologically abnormal lymph nodes identified.  She also underwent genetic testing on 12/13/2018, which yielded negative results.  She is being treated with systemic therapy by Dr. Gudena: abraxane herceptin weekly x12 from 11/08/2018 - 01/24/2019.  She opted to proceed with right breast lumpectomy on date of 03/08/2019 with pathology report revealing: tumor size of 0.7 cm; histology of invasive ductal carcinoma; margin  status to invasive disease of 2 mm; nodal status of negative (0/2); ER status: 100%; PR status 5%, Her2 status equivocal by immunohistochemistry; Grade 2.  Symptomatically, the patient reports: she is healing from surgery with some soreness in the breast. Her hair thinned from chemotherapy but she did not lose it all.  She began maintenance Kadcyla on 03/26/2019.         ALLERGIES:  is allergic to paclitaxel.  Meds: Current Outpatient Medications  Medication Sig Dispense Refill  . fluticasone (FLONASE) 50 MCG/ACT nasal spray Place 1 spray into both nostrils 2 (two) times daily. 16 g 2  . lidocaine-prilocaine (EMLA) cream Apply to affected area once 30 g 3  . loratadine (CLARITIN) 10 MG tablet Take 10 mg by mouth daily.    . Multiple Vitamin (MULTIVITAMIN) tablet Take 1 tablet by mouth daily.      No current facility-administered medications for this encounter.     Physical Findings:  height is 5' 5" (1.651 m) and weight is 184 lb 9.6 oz (83.7 kg). Her oral temperature is 98 F (36.7 C). Her blood pressure is 139/77 and her pulse is 74. Her respiration is 2 (abnormal) and oxygen saturation is 100%. .     General: Alert and oriented, in no acute distress Musculoskeletal: good ROM in shoulders. Psychiatric: Judgment and insight are intact. Affect is appropriate. Breast exam reveals good healing at surgical scars, right breast  Lab Findings: Lab Results  Component Value Date   WBC 5.5 03/28/2019   HGB 11.4 (L) 03/28/2019   HCT 36.3 03/28/2019   MCV 99.5 03/28/2019   PLT 293 03/28/2019     Radiographic Findings: Nm Sentinel Node Inj-no Rpt (  breast)  Result Date: 03/08/2019 Sulfur colloid was injected by the nuclear medicine technologist for melanoma sentinel node.    Impression/Plan: Right Breast IDC s/p neoadjuvant therapy  We discussed adjuvant radiotherapy today.  I recommend 4 weeks directed at the right breast in order to reduce risk of locoregional recurrence by 2/3.   The risks, benefits and side effects of this treatment were discussed in detail.  She understands that radiotherapy is associated with breast tenderness, skin irritation and fatigue in the acute setting. Late effects can include cosmetic changes and rare injury to internal organs.  She is enthusiastic about proceeding with treatment. Simulation will occur today. A consent form has been signed and placed in her chart. _____________________________________    , MD   This document serves as a record of services personally performed by  , MD. It was created on her behalf by Katie Daubenspeck, a trained medical scribe. The creation of this record is based on the scribe's personal observations and the provider's statements to them. This document has been checked and approved by the attending provider.   

## 2019-03-29 ENCOUNTER — Encounter: Payer: Self-pay | Admitting: Radiation Oncology

## 2019-03-29 ENCOUNTER — Ambulatory Visit
Admission: RE | Admit: 2019-03-29 | Discharge: 2019-03-29 | Disposition: A | Discharge status: Home / Self Care | Payer: BC Managed Care – PPO | Source: Ambulatory Visit | Attending: Radiation Oncology | Admitting: Radiation Oncology

## 2019-03-29 ENCOUNTER — Ambulatory Visit
Admission: RE | Admit: 2019-03-29 | Discharge: 2019-03-29 | Disposition: A | Discharge status: Home / Self Care | Payer: BC Managed Care – PPO | Source: Ambulatory Visit | Attending: Hematology and Oncology | Admitting: Hematology and Oncology

## 2019-03-29 ENCOUNTER — Other Ambulatory Visit: Payer: Self-pay

## 2019-03-29 DIAGNOSIS — Z79899 Other long term (current) drug therapy: Secondary | ICD-10-CM | POA: Insufficient documentation

## 2019-03-29 DIAGNOSIS — Z9221 Personal history of antineoplastic chemotherapy: Secondary | ICD-10-CM | POA: Diagnosis not present

## 2019-03-29 DIAGNOSIS — C50211 Malignant neoplasm of upper-inner quadrant of right female breast: Secondary | ICD-10-CM | POA: Diagnosis not present

## 2019-03-29 DIAGNOSIS — Z9889 Other specified postprocedural states: Secondary | ICD-10-CM | POA: Diagnosis not present

## 2019-03-29 DIAGNOSIS — Z170 Estrogen receptor positive status [ER+]: Secondary | ICD-10-CM | POA: Insufficient documentation

## 2019-03-29 DIAGNOSIS — Z510 Encounter for antineoplastic radiation therapy: Secondary | ICD-10-CM | POA: Diagnosis not present

## 2019-03-30 ENCOUNTER — Encounter: Payer: Self-pay | Admitting: Radiation Oncology

## 2019-03-30 NOTE — Progress Notes (Signed)
  Radiation Oncology         [phone]) [phone] ________________________________  Name: Judith Thomas MRN: [id number]  Date: 03/29/2019  DOB: 1955-11-02  SIMULATION AND TREATMENT PLANNING NOTE    Outpatient  DIAGNOSIS:     ICD-10-CM   1. Malignant neoplasm of upper-inner quadrant of right breast in female, estrogen receptor positive ([place])  C50.211    Z17.0     NARRATIVE:  The patient was brought to the [facility].  Identity was confirmed.  All relevant records and images related to the planned course of therapy were reviewed.  The patient freely provided informed written consent to proceed with treatment after reviewing the details related to the planned course of therapy. The consent form was witnessed and verified by the simulation staff.    Then, the patient was set-up in a stable reproducible supine position for radiation therapy with her ipsilateral arm over her head, and her upper body secured in a custom-made Vac-lok device.  CT images were obtained.  Surface markings were placed.  The CT images were loaded into the planning software.    TREATMENT PLANNING NOTE: Treatment planning then occurred.  The radiation prescription was entered and confirmed.     A total of 3 medically necessary complex treatment devices were fabricated and supervised by me: 2 fields with MLCs for custom blocks to protect heart, and lungs;  and, a Vac-lok. MORE COMPLEX DEVICES MAY BE MADE IN DOSIMETRY FOR FIELD IN FIELD BEAMS FOR DOSE HOMOGENEITY.  I have requested : 3D Simulation which is medically necessary to give adequate dose to at risk tissues while sparing lungs and heart.  I have requested a DVH of the following structures: lungs, heart, right breast lumpectomy cavity.    The patient will receive 40.05 Gy in 15 fractions to the right breast with 2 tangential fields.  This will be followed by a boost.  Optical Surface Tracking Plan:  Since intensity modulated radiotherapy (IMRT) and 3D  conformal radiation treatment methods are predicated on accurate and precise positioning for treatment, intrafraction motion monitoring is medically necessary to ensure accurate and safe treatment delivery. The ability to quantify intrafraction motion without excessive ionizing radiation dose can only be performed with optical surface tracking. Accordingly, surface imaging offers the opportunity to obtain 3D measurements of patient position throughout IMRT and 3D treatments without excessive radiation exposure. I am ordering optical surface tracking for this patient's upcoming course of radiotherapy.  ________________________________   Reference:  Ursula Alert, J, et al. Surface imaging-based analysis of intrafraction motion for breast radiotherapy patients.Journal of [place], n. 6, nov. 2014. ISSN [id number].  Available at: <http://www.jacmp.org/index.php/jacmp/article/view/4957>.    -----------------------------------  Eppie Gibson, MD

## 2019-04-02 ENCOUNTER — Encounter: Payer: Self-pay | Admitting: Physical Therapy

## 2019-04-02 ENCOUNTER — Telehealth: Payer: Self-pay | Admitting: Hematology and Oncology

## 2019-04-02 ENCOUNTER — Other Ambulatory Visit: Payer: Self-pay

## 2019-04-02 ENCOUNTER — Ambulatory Visit: Payer: BC Managed Care – PPO | Attending: General Surgery | Admitting: Physical Therapy

## 2019-04-02 ENCOUNTER — Encounter: Payer: Self-pay | Admitting: *Deleted

## 2019-04-02 DIAGNOSIS — R293 Abnormal posture: Secondary | ICD-10-CM | POA: Insufficient documentation

## 2019-04-02 DIAGNOSIS — Z483 Aftercare following surgery for neoplasm: Secondary | ICD-10-CM | POA: Insufficient documentation

## 2019-04-02 NOTE — Telephone Encounter (Signed)
I talk with patient regarding schedule  

## 2019-04-02 NOTE — Therapy (Signed)
[place], [place], [address] Phone: [phone]   Fax:  [phone]  Physical Therapy Evaluation  Patient Details  Name: Judith Thomas MRN: [id number] Date of Birth: Aug 29, 1956 Referring Provider (PT): Dr. [name]    Encounter Date: 04/02/2019  PT End of Session - 04/02/19 1734    Visit Number  1    Number of Visits  4    Date for PT Re-Evaluation  06/10/19    PT Start Time  6270    PT Stop Time  1615    PT Time Calculation (min)  45 min    Activity Tolerance  Patient tolerated treatment well    Behavior During Therapy  [facility] for tasks assessed/performed       Past Medical History:  Diagnosis Date  . Breast cancer ([place])    right  . Family history of bladder cancer   . Family history of breast cancer   . Knee pain, chronic 10/11/2015    Past Surgical History:  Procedure Laterality Date  . BREAST LUMPECTOMY WITH RADIOACTIVE SEED AND SENTINEL LYMPH NODE BIOPSY Right 03/08/2019   Procedure: RIGHT BREAST RADIOACTIVE SEED LUMPECTOMY X2 AND SENTINEL LYMPH NODE BIOPSY;  Surgeon: Jovita Kussmaul, MD;  Location: [place];  Service: General;  Laterality: Right;  . COLONOSCOPY  03/2010  . none    . PORTACATH PLACEMENT Left 11/07/2018   Procedure: INSERTION PORT-A-CATH WITH ULTRASOUND;  Surgeon: Jovita Kussmaul, MD;  Location: [place];  Service: General;  Laterality: Left;    There were no vitals filed for this visit.   Subjective Assessment - 04/02/19 1531    Subjective  "I've already had chemo and will start radiation on Monday" She feels like she is doing ok . She has a little fullness in in her axilla that is reducing    Pertinent History  right breast cancer diagnosed on De. 19,  Pt had chemotherapy and had an allergic reaction to taxol and then had lumpectomy with 2 nodes removed May 29. 2020    Currently in Pain?  No/denies         [facility] PT Assessment - 04/02/19  0001      Assessment   Medical Diagnosis  right breast cancer    Referring Provider (PT)  Dr. [name]     Onset Date/Surgical Date  09/27/18    Hand Dominance  Right      Precautions   Precautions  None      Restrictions   Weight Bearing Restrictions  No      Balance Screen   Has the patient fallen in the past 6 months  No    Has the patient had a decrease in activity level because of a fear of falling?   No    Is the patient reluctant to leave their home because of a fear of falling?   No      Home Film/video editor residence    Living Arrangements  Spouse/significant other    Available Help at Discharge  Family      Prior Function   Level of [employer]  Retired    Leisure  walks 2 miles every day, read, paint, grandkids       Cognition   Overall Cognitive Status  Within Functional Limits for tasks assessed      Observation/Other Assessments   Observations  Pt has well  healed incision in right axilla and across top of right breast that still has bits of skin glue on the ends. markers for radiation are in place.  right breast appears to be a bit swollen and is larger than left. visible swelling in right axilla     Quick DASH   6.82      Observation/Other Assessments-Edema    Edema  --   yes in right axilla and right breast      Sensation   Light Touch  Not tested      Coordination   Gross Motor Movements are Fluid and Coordinated  Yes      Posture/Postural Control   Posture/Postural Control  Postural limitations    Postural Limitations  Rounded Shoulders;Forward head;Decreased thoracic kyphosis      ROM / Strength   AROM / PROM / Strength  AROM;Strength      AROM   Right Shoulder Flexion  158 Degrees    Right Shoulder ABduction  165 Degrees    Left Shoulder Flexion  155 Degrees    Left Shoulder ABduction  160 Degrees      Strength   Overall Strength  Deficits    Overall Strength Comments  appears to have  general deconditioning       Palpation   Palpation comment  firm fullness at right axilla near incision  and at right breast         LYMPHEDEMA/ONCOLOGY QUESTIONNAIRE - 04/02/19 1548      Right Upper Extremity Lymphedema   10 cm Proximal to Olecranon Process  30.5 cm    Olecranon Process  25 cm    10 cm Proximal to Ulnar Styloid Process  21.5 cm    Just Proximal to Ulnar Styloid Process  15.6 cm    Across Hand at [employer]  18.5 cm    At [place] of 2nd Digit  5.9 cm      Left Upper Extremity Lymphedema   10 cm Proximal to Olecranon Process  30.5 cm    Olecranon Process  25 cm    10 cm Proximal to Ulnar Styloid Process  20.5 cm    Just Proximal to Ulnar Styloid Process  15.5 cm    Across Hand at [employer]  18.2 cm    At [place] of 2nd Digit  5.9 cm          Quick Dash - 04/02/19 0001    Open a tight or new jar  No difficulty    Do heavy household chores (wash walls, wash floors)  No difficulty    Carry a shopping bag or briefcase  No difficulty    Wash your back  No difficulty    Use a knife to cut food  No difficulty    Recreational activities in which you take some force or impact through your arm, shoulder, or hand (golf, hammering, tennis)  Mild difficulty    During the past week, to what extent has your arm, shoulder or hand problem interfered with your normal social activities with family, friends, neighbors, or groups?  Slightly    During the past week, to what extent has your arm, shoulder or hand problem limited your work or other regular daily activities  Not at all    Arm, shoulder, or hand pain.  Mild    Tingling (pins and needles) in your arm, shoulder, or hand  None    Difficulty Sleeping  No difficulty    DASH Score  6.82 %        Objective measurements completed on examination: See above findings.      [facility] Adult PT Treatment/Exercise - 04/02/19 0001      Self-Care   Self-Care  Other Self-Care Comments    Other Self-Care Comments   talked  with pt about compression bras.  She will try a sports bra for now. gave piece of thin foam on celona to wear at axilla in side sports bra  gave pt packet from ABC class as she wants to be notified when we will have our virtual class      Shoulder Exercises: Supine   Other Supine Exercises   dowel flexion and abduction       Shoulder Exercises: Standing   Other Standing Exercises  dowel flexion and abduction              PT Education - 04/02/19 1733    Education Details  compression for right axilla, possible compression bras, dowel rod shoulder exercises    Person(s) Educated  Patient    Methods  Explanation;Demonstration;Handout    Comprehension  Verbalized understanding;Returned demonstration          PT Long Term Goals - 04/02/19 1745      PT LONG TERM GOAL #1   Title  pt wil report the swelling in her right axilla is decreased by 50%    Time  3    Period  Months    Status  New      PT LONG TERM GOAL #2   Title  Pt will be independent in a home program for shoulder range of motion and strength    Time  3    Period  Months    Status  New      PT LONG TERM GOAL #3   Title  Pt wil verbalize understanding of lymphedema risk reduction precautions    Time  3    Period  Months    Status  New             Plan - 04/02/19 1736    Clinical Impression Statement  63 yo female s/p right lumpectomy with sentinel node biopsy who is preparing to start radiation.  She has good shoulder ROM and no signs of lymphedema in her arm.  She does have fullness in her right axila and right breast that she says is resolving.  She was given a foam patch to wear at axilla and will try wearing her sports bra at home. If she needs more, she will ask MD for a prescriptio to get a compression bra. She was instructed in stretching exercise to do throughout radiation.  She will call if she needs more PT before then but will come back to PT about 2 weeks afte completion of radiation for recheck  and learning an UE strengthening program.    Personal Factors and Comorbidities  Comorbidity 3+;Comorbidity 2    Comorbidities  surgery, previous chemo , ongoing radiation    Examination-Activity Limitations  Reach Overhead   pt reports pain and tightness with reaching   Stability/Clinical Decision Making  Evolving/Moderate complexity   pt to begin radiation   Clinical Decision Making  Moderate    Rehab Potential  Excellent    PT Frequency  Other (comment)   will check back after completion of radiation for re-eval   PT Treatment/Interventions  ADLs/Self Care Home Management;DME Instruction;Therapeutic activities;Therapeutic exercise;Patient/family education;Orthotic Fit/Training;Manual techniques;Manual lymph drainage;Compression bandaging;Scar mobilization;Passive range of  motion    PT Next Visit Plan  re assess for right axillay and breast lmphedema, ROM. teach strength ABC program    PT Home Exercise Plan  supine and standing dowel exercises    Consulted and Agree with Plan of Care  Patient       Patient will benefit from skilled therapeutic intervention in order to improve the following deficits and impairments:  Decreased skin integrity, Increased edema, Decreased scar mobility, Decreased range of motion, Decreased knowledge of precautions, Decreased strength, Impaired UE functional use, Postural dysfunction  Visit Diagnosis: 1. Aftercare following surgery for neoplasm   2. Abnormal posture        Problem List Patient Active Problem List   Diagnosis Date Noted  . Genetic testing 01/09/2019  . Family history of breast cancer   . Family history of bladder cancer   . Port-A-Cath in place 11/15/2018  . Malignant neoplasm of upper-inner quadrant of right breast in female, estrogen receptor positive ([place]) 10/24/2018  . BMI 28.0-28.9,adult 07/28/2015   Donato Heinz. Owens Shark PT  Norwood Levo 04/02/2019, 5:48 PM  [place], [place], [address] Phone: [phone]   Fax:  [phone]  Name: Arihana Ambrocio MRN: [id number] Date of Birth: 1955-10-16

## 2019-04-02 NOTE — Patient Instructions (Addendum)
SHOULDER: Flexion - Supine (Cane)        Cancer Rehab [phone]    Hold cane in both hands. Raise arms up overhead. Do not allow back to arch. Hold _5__ seconds. Do __5-10__ times; __1-2__ times a day.   SELF ASSISTED WITH OBJECT: Shoulder Abduction / Adduction - Supine    Hold cane with both hands. Move both arms from side to side, keep elbows straight.  Hold when stretch felt for __5__ seconds. Repeat __5-10__ times; __1-2__ times a day. Once this becomes easier progress to third picture bringing affected arm towards ear by staying out to side. Same hold for _5_seconds. Repeat  _5-10_ times, _1-2_ times/day.  Shoulder Blade Stretch    Clasp fingers behind head with elbows touching in front of face. Pull elbows back while pressing shoulder blades together. Relax and hold as tolerated, can place pillow under elbow here for comfort as needed and to allow for prolonged stretch.  Repeat __5__ times. Do __1-2__ sessions per day.     SHOULDER: External Rotation - Supine (Cane)    Hold cane with both hands. Rotate arm away from body. Keep elbow on floor and next to body. _5-10__ reps per set, hold 5 seconds, _1-2__ sets per day. Add towel to keep elbow at side.  Copyright  VHI. All rights reserved.     Flexion (Eccentric) - Active-Assist (Cane)          Cancer Rehab [phone]    Use unaffected arm to push affected arm forward. Avoid hiking shoulder (shoulder should NOT touch cheek). Keep palm relaxed. Slowly lower affected arm. Hold stretch for _5_ seconds repeating _5-10_ times, _1-2_ times a day.  Abduction (Eccentric) - Active-Assist (Cane)    Use unaffected arm to push affected arm out to side. Avoid hiking shoulder (shoulder should NOT touch cheek). Keep palm relaxed. Slowly lower affected arm. Hold stretch _5_ seconds repeating _5-10_ times, _1-2_ times a day.  Cane Exercise: Extension   Stand holding cane behind back with both hands palm-up. Lift the cane away from  body until gentle stretch felt. Do NOT lean forward.  Hold __5__ seconds. Repeat _5-10___ times. Do _1-2___ sessions per day.  CHEST: Doorway, Bilateral - Standing    Standing in doorway, place hands on wall with elbows bent at shoulder height and place one foot in front of other. Shift weight onto front foot. Hold _10-20__ seconds. Do _3-5_ times, _1-2_ times a day.    First of all, check with your insurance company to see if provider is in [place] (for wigs and compression sleeves / gloves/gauntlets )  [place], [place] [address] [phone]  Will file some insurances --- call for appointment   [facility] (for mastectomy prosthetics and garments) [place], [place] [address] [phone] Will file some insurances --- call for appointment  [facility]  [address]  [place], [place] [address] [phone] Lower extremity garments  [facility] [place], [place]  [address] [place] ( Medicaid certified lymphedema fitter) [phone] [email or web address]  [place]  [address],  [address] [phone]  Other Resources: National Lymphedema Network:  www.lymphnet.org www.Klosetraining.com for patient articles and self manual lymph drainage information www.lymphedemablog.com has informative articles.  [email or web address] www.lymphedemaproducts.com www.[email or web address] [email or web address]

## 2019-04-08 ENCOUNTER — Other Ambulatory Visit: Payer: Self-pay

## 2019-04-08 ENCOUNTER — Ambulatory Visit
Admission: RE | Admit: 2019-04-08 | Discharge: 2019-04-08 | Disposition: A | Discharge status: Home / Self Care | Payer: BC Managed Care – PPO | Source: Ambulatory Visit | Attending: Radiation Oncology | Admitting: Radiation Oncology

## 2019-04-08 DIAGNOSIS — Z9221 Personal history of antineoplastic chemotherapy: Secondary | ICD-10-CM | POA: Diagnosis not present

## 2019-04-08 DIAGNOSIS — Z510 Encounter for antineoplastic radiation therapy: Secondary | ICD-10-CM | POA: Diagnosis not present

## 2019-04-08 DIAGNOSIS — C50211 Malignant neoplasm of upper-inner quadrant of right female breast: Secondary | ICD-10-CM | POA: Diagnosis not present

## 2019-04-08 DIAGNOSIS — Z79899 Other long term (current) drug therapy: Secondary | ICD-10-CM | POA: Diagnosis not present

## 2019-04-08 DIAGNOSIS — Z170 Estrogen receptor positive status [ER+]: Secondary | ICD-10-CM | POA: Diagnosis not present

## 2019-04-08 MED ORDER — RADIAPLEXRX EX GEL
Freq: Once | CUTANEOUS | Status: AC
  Administered 2019-04-08: 17:00:00 via TOPICAL

## 2019-04-08 MED ORDER — ALRA NON-METALLIC DEODORANT (RAD-ONC)
1.0000 "application " | Freq: Once | TOPICAL | Status: AC
  Administered 2019-04-08: 1 via TOPICAL

## 2019-04-09 ENCOUNTER — Other Ambulatory Visit: Payer: Self-pay

## 2019-04-09 ENCOUNTER — Ambulatory Visit
Admission: RE | Admit: 2019-04-09 | Discharge: 2019-04-09 | Disposition: A | Discharge status: Home / Self Care | Payer: BC Managed Care – PPO | Source: Ambulatory Visit | Attending: Radiation Oncology | Admitting: Radiation Oncology

## 2019-04-09 DIAGNOSIS — Z170 Estrogen receptor positive status [ER+]: Secondary | ICD-10-CM | POA: Diagnosis not present

## 2019-04-09 DIAGNOSIS — Z510 Encounter for antineoplastic radiation therapy: Secondary | ICD-10-CM | POA: Diagnosis not present

## 2019-04-09 DIAGNOSIS — Z79899 Other long term (current) drug therapy: Secondary | ICD-10-CM | POA: Diagnosis not present

## 2019-04-09 DIAGNOSIS — Z9221 Personal history of antineoplastic chemotherapy: Secondary | ICD-10-CM | POA: Diagnosis not present

## 2019-04-09 DIAGNOSIS — C50211 Malignant neoplasm of upper-inner quadrant of right female breast: Secondary | ICD-10-CM | POA: Diagnosis not present

## 2019-04-10 ENCOUNTER — Ambulatory Visit
Admission: RE | Admit: 2019-04-10 | Discharge: 2019-04-10 | Disposition: A | Discharge status: Home / Self Care | Payer: BC Managed Care – PPO | Source: Ambulatory Visit | Attending: Radiation Oncology | Admitting: Radiation Oncology

## 2019-04-10 ENCOUNTER — Other Ambulatory Visit: Payer: Self-pay

## 2019-04-10 DIAGNOSIS — Z79899 Other long term (current) drug therapy: Secondary | ICD-10-CM | POA: Insufficient documentation

## 2019-04-10 DIAGNOSIS — Z510 Encounter for antineoplastic radiation therapy: Secondary | ICD-10-CM | POA: Insufficient documentation

## 2019-04-10 DIAGNOSIS — Z170 Estrogen receptor positive status [ER+]: Secondary | ICD-10-CM | POA: Insufficient documentation

## 2019-04-10 DIAGNOSIS — Z9221 Personal history of antineoplastic chemotherapy: Secondary | ICD-10-CM | POA: Diagnosis not present

## 2019-04-10 DIAGNOSIS — C50211 Malignant neoplasm of upper-inner quadrant of right female breast: Secondary | ICD-10-CM | POA: Insufficient documentation

## 2019-04-11 ENCOUNTER — Other Ambulatory Visit: Payer: Self-pay

## 2019-04-11 ENCOUNTER — Ambulatory Visit
Admission: RE | Admit: 2019-04-11 | Discharge: 2019-04-11 | Disposition: A | Discharge status: Home / Self Care | Payer: BC Managed Care – PPO | Source: Ambulatory Visit | Attending: Radiation Oncology | Admitting: Radiation Oncology

## 2019-04-11 DIAGNOSIS — Z510 Encounter for antineoplastic radiation therapy: Secondary | ICD-10-CM | POA: Diagnosis not present

## 2019-04-11 DIAGNOSIS — C50211 Malignant neoplasm of upper-inner quadrant of right female breast: Secondary | ICD-10-CM | POA: Diagnosis not present

## 2019-04-11 DIAGNOSIS — Z79899 Other long term (current) drug therapy: Secondary | ICD-10-CM | POA: Diagnosis not present

## 2019-04-11 DIAGNOSIS — Z170 Estrogen receptor positive status [ER+]: Secondary | ICD-10-CM | POA: Diagnosis not present

## 2019-04-11 DIAGNOSIS — Z9221 Personal history of antineoplastic chemotherapy: Secondary | ICD-10-CM | POA: Diagnosis not present

## 2019-04-11 NOTE — Assessment & Plan Note (Signed)
09/27/19:Two right breast masses 12:00 to 1 o'clock position 1.3 cm and 1.2 cm, they are 1.5 cm apart. 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 Neoadjuvant chemotherapy withTaxol Herceptin weekly x12completed 4/16/2020followed by Herceptin maintenance vsKadcylafor 1 year 2.03/08/2019: Right lumpectomy IDC s/p neoadjuvant treatment, grade 2, 0.7cm, ER+ 100%, PR+ 5%, HER2 equivocal, Ki67 20%, 0/2 right axillary sentinel lymph nodes negative for carcinoma, with clear margins. 3. Followed by adjuvant radiation therapy 4.Followed by antiestrogen therapy ------------------------------------------------------------------------------------------------------------------------------------------------------------ Treatment plan: Kadcyla maintenance therapy started 03/28/2019 Adjuvant radiation therapy: Kadcyla toxicities:  Return to clinic every 3 weeks for Kadcyla and follow-up with me.

## 2019-04-15 ENCOUNTER — Other Ambulatory Visit: Payer: Self-pay

## 2019-04-15 ENCOUNTER — Ambulatory Visit
Admission: RE | Admit: 2019-04-15 | Discharge: 2019-04-15 | Disposition: A | Discharge status: Home / Self Care | Payer: BC Managed Care – PPO | Source: Ambulatory Visit | Attending: Radiation Oncology | Admitting: Radiation Oncology

## 2019-04-15 DIAGNOSIS — Z510 Encounter for antineoplastic radiation therapy: Secondary | ICD-10-CM | POA: Diagnosis not present

## 2019-04-15 DIAGNOSIS — Z170 Estrogen receptor positive status [ER+]: Secondary | ICD-10-CM | POA: Diagnosis not present

## 2019-04-15 DIAGNOSIS — Z79899 Other long term (current) drug therapy: Secondary | ICD-10-CM | POA: Diagnosis not present

## 2019-04-15 DIAGNOSIS — Z9221 Personal history of antineoplastic chemotherapy: Secondary | ICD-10-CM | POA: Diagnosis not present

## 2019-04-15 DIAGNOSIS — C50211 Malignant neoplasm of upper-inner quadrant of right female breast: Secondary | ICD-10-CM | POA: Diagnosis not present

## 2019-04-16 ENCOUNTER — Ambulatory Visit
Admission: RE | Admit: 2019-04-16 | Discharge: 2019-04-16 | Disposition: A | Discharge status: Home / Self Care | Payer: BC Managed Care – PPO | Source: Ambulatory Visit | Attending: Radiation Oncology | Admitting: Radiation Oncology

## 2019-04-16 ENCOUNTER — Other Ambulatory Visit: Payer: Self-pay

## 2019-04-16 DIAGNOSIS — Z79899 Other long term (current) drug therapy: Secondary | ICD-10-CM | POA: Diagnosis not present

## 2019-04-16 DIAGNOSIS — C50211 Malignant neoplasm of upper-inner quadrant of right female breast: Secondary | ICD-10-CM | POA: Diagnosis not present

## 2019-04-16 DIAGNOSIS — Z170 Estrogen receptor positive status [ER+]: Secondary | ICD-10-CM | POA: Diagnosis not present

## 2019-04-16 DIAGNOSIS — Z510 Encounter for antineoplastic radiation therapy: Secondary | ICD-10-CM | POA: Diagnosis not present

## 2019-04-16 DIAGNOSIS — Z9221 Personal history of antineoplastic chemotherapy: Secondary | ICD-10-CM | POA: Diagnosis not present

## 2019-04-17 ENCOUNTER — Ambulatory Visit
Admission: RE | Admit: 2019-04-17 | Discharge: 2019-04-17 | Disposition: A | Discharge status: Home / Self Care | Payer: BC Managed Care – PPO | Source: Ambulatory Visit | Attending: Radiation Oncology | Admitting: Radiation Oncology

## 2019-04-17 ENCOUNTER — Other Ambulatory Visit: Payer: Self-pay

## 2019-04-17 DIAGNOSIS — Z9221 Personal history of antineoplastic chemotherapy: Secondary | ICD-10-CM | POA: Diagnosis not present

## 2019-04-17 DIAGNOSIS — C50211 Malignant neoplasm of upper-inner quadrant of right female breast: Secondary | ICD-10-CM | POA: Diagnosis not present

## 2019-04-17 DIAGNOSIS — Z510 Encounter for antineoplastic radiation therapy: Secondary | ICD-10-CM | POA: Diagnosis not present

## 2019-04-17 DIAGNOSIS — Z79899 Other long term (current) drug therapy: Secondary | ICD-10-CM | POA: Diagnosis not present

## 2019-04-17 DIAGNOSIS — Z170 Estrogen receptor positive status [ER+]: Secondary | ICD-10-CM | POA: Diagnosis not present

## 2019-04-17 NOTE — Progress Notes (Signed)
Patient Care Team: Chevis Pretty, FNP as PCP - General (Nurse Practitioner) Mauro Kaufmann, RN as Oncology Nurse Navigator Rockwell Germany, RN as Oncology Nurse Navigator  DIAGNOSIS:    ICD-10-CM   1. Malignant neoplasm of upper-inner quadrant of right breast in female, estrogen receptor positive ([place])  C50.211    Z17.0     SUMMARY OF ONCOLOGIC HISTORY: Oncology History  Malignant neoplasm of upper-inner quadrant of right breast in female, estrogen receptor positive ([place])  09/26/2018 Initial Diagnosis   Two right breast masses 12:00 to 1 o'clock position 1.3 cm and 1.2 cm, they are 1.5 cm apart.  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 The Hereditary Gene Panel offered by [facility] includes sequencing and/or deletion duplication testing of the following 48 genes: APC, ATM, AXIN2, BARD1, BMPR1A, BRCA1, BRCA2, BRIP1, CDH1, CDK4, CDKN2A (p14ARF), CDKN2A (p16INK4a), CHEK2, CTNNA1, DICER1, EPCAM (Deletion/duplication testing only), GREM1 (promoter region deletion/duplication testing only), KIT, MEN1, MLH1, MSH2, MSH3, MSH6, MUTYH, NBN, NF1, NHTL1, PALB2, PDGFRA, PMS2, POLD1, POLE, PTEN, RAD50, RAD51C, RAD51D, RNF43, SDHB, SDHC, SDHD, SMAD4, SMARCA4. STK11, TP53, TSC1, TSC2, and VHL.  The following genes were evaluated for sequence changes only: SDHA and HOXB13 c.251G>A variant only. The  report date is January 09, 2019.   03/08/2019 Surgery   Right lumpectomy Judith Thomas): IDC s/p neoadjuvant treatment, grade 2, 0.7cm, ER+ 100%, PR+ 5%, HER2 equivocal, Ki67 20%, 0/2 right axillary sentinel lymph nodes negative for carcinoma, with clear margins.    03/28/2019 -  Chemotherapy   The patient had ado-trastuzumab emtansine (KADCYLA) 300 mg in sodium chloride 0.9 % 250 mL chemo infusion, 3.6 mg/kg = 300 mg, Intravenous, Once, 1 of 11 cycles Administration: 300 mg (03/28/2019)  for chemotherapy treatment.    03/29/2019 Cancer Staging   Staging form: Breast, AJCC 8th Edition - Pathologic stage from 03/29/2019: No Stage Recommended (ypT1b, pN0, cM0, G2, ER+, PR+, HER2: Equivocal) - Signed by Eppie Gibson, MD on 03/30/2019     CHIEF COMPLIANT: Kadcyla maintenance  INTERVAL HISTORY: Judith Thomas is a 63 y.o. with above-mentioned history of right breast cancer treated with neoadjuvant chemotherapy and lumpectomy. She is a participant in the [employer] clinical trial. She presents to the clinic today for Kadcyla maintenance.  REVIEW OF SYSTEMS:   Constitutional: Denies fevers, chills or abnormal weight loss Eyes: Denies blurriness of vision Ears, nose, mouth, throat, and face: Denies mucositis or sore throat Respiratory: Denies cough, dyspnea or wheezes Cardiovascular: Denies palpitation, chest discomfort Gastrointestinal: Denies nausea, heartburn or change in bowel habits Skin: Denies abnormal skin rashes Lymphatics: Denies new lymphadenopathy or easy bruising Neurological: Denies numbness, tingling or new weaknesses Behavioral/Psych: Mood is stable, no new changes  Extremities: No lower extremity edema Breast: denies any pain or lumps or nodules in either breasts All other systems were reviewed with the patient and are negative.  I have reviewed the past medical history, past surgical history, social history and family history with the patient and they are unchanged from previous note.   ALLERGIES:  is allergic to paclitaxel.  MEDICATIONS:  Current Outpatient Medications  Medication Sig Dispense Refill  . fluticasone (FLONASE) 50 MCG/ACT nasal spray Place 1 spray into both nostrils 2 (two) times daily. 16 g 2  . lidocaine-prilocaine (EMLA) cream Apply to affected area once 30 g 3  . loratadine (CLARITIN) 10 MG tablet Take 10 mg by mouth daily.    . Multiple Vitamin (MULTIVITAMIN) tablet Take 1 tablet by mouth daily.      No current facility-administered medications for this visit.     PHYSICAL EXAMINATION: ECOG PERFORMANCE STATUS: 1 - Symptomatic but completely ambulatory  Vitals:   04/18/19 0837  BP: 136/71  Pulse: 67  Resp: 17  Temp: 98.3 F (36.8 C)  SpO2: 100%   Filed Weights   04/18/19 0837  Weight: 181 lb 1 oz (82.1 kg)    GENERAL: alert, no distress and comfortable SKIN: skin color, texture, turgor are normal, no rashes or significant lesions EYES: normal, Conjunctiva are pink and non-injected, sclera clear OROPHARYNX: no exudate, no erythema and lips, buccal mucosa, and tongue normal  NECK: supple, thyroid normal size, non-tender, without nodularity LYMPH: no palpable lymphadenopathy in the cervical, axillary or inguinal LUNGS: clear to auscultation and percussion with normal breathing effort HEART: regular rate & rhythm and no murmurs and no lower extremity edema ABDOMEN: abdomen soft, non-tender and normal bowel sounds MUSCULOSKELETAL: no cyanosis of digits and no clubbing  NEURO: alert & oriented x 3 with fluent speech, no focal motor/sensory deficits EXTREMITIES: No lower extremity edema  LABORATORY DATA:  I have reviewed the data as listed CMP Latest Ref Rng & Units 04/18/2019 03/28/2019 03/05/2019  Glucose 70 - 99 mg/dL 97 91 83  BUN 8 - 23 mg/dL _0 Creatinine 0.44 - 1.00 mg/dL 0.77 0.79 0.73  Sodium 135 - 145 mmol/L 139 142 141  Potassium 3.5 - 5.1 mmol/L 4.6 4.5 4.3  Chloride 98 - 111 mmol/L 105 108 107  CO2 22 - 32 mmol/L _1 Calcium 8.9 - 10.3 mg/dL 8.9 9.4 9.0  Total Protein 6.5 - 8.1 g/dL 7.3 6.9 6.9  Total Bilirubin 0.3 - 1.2 mg/dL 0.4 0.5 0.5  Alkaline Phos 38 - 126 U/L 111 107 92  AST 15 - 41 U/L 19 12(L) 13(L)  ALT 0 - 44 U/L _2 Lab Results  Component Value Date   WBC 5.3 04/18/2019   HGB 12.0 04/18/2019   HCT 38.2 04/18/2019   MCV 94.3 04/18/2019   PLT 353 04/18/2019   NEUTROABS 3.6 04/18/2019    ASSESSMENT & PLAN:  Malignant neoplasm of upper-inner quadrant of right breast in female, estrogen receptor positive ([place]) 09/27/19:Two right breast masses 12:00 to 1 o'clock position 1.3 cm and 1.2 cm, they are 1.5 cm apart. 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 Neoadjuvant chemotherapy withTaxol Herceptin weekly x12completed 4/16/2020followed by Herceptin maintenance vsKadcylafor 1 year 2.03/08/2019: Right lumpectomy IDC s/p neoadjuvant treatment, grade 2, 0.7cm, ER+ 100%, PR+ 5%, HER2 equivocal, Ki67 20%, 0/2 right axillary sentinel lymph nodes negative for carcinoma, with clear margins.  3. Followed by adjuvant radiation therapy 4.Followed by antiestrogen therapy ------------------------------------------------------------------------------------------------------------------------------------------------------------ Treatment plan: Kadcyla maintenance therapy started 03/28/2019 Adjuvant radiation therapy: Started 04/09/2019 Kadcyla toxicities: Denies any side effects from Kadcyla.  Return to clinic every 3 weeks for Kadcyla and every 6 weeks for follow-up with me.   No orders of the defined types were placed in this encounter.  The patient has a good understanding of the overall plan. she agrees with it. she will call with any problems that may develop before the next visit here.  Nicholas Lose, MD 04/18/2019  Julious Oka Dorshimer am acting as scribe for Dr. Nicholas Lose.  I have reviewed the above documentation for accuracy and completeness, and I agree with the above.

## 2019-04-18 ENCOUNTER — Inpatient Hospital Stay: Payer: BC Managed Care – PPO

## 2019-04-18 ENCOUNTER — Ambulatory Visit
Admission: RE | Admit: 2019-04-18 | Discharge: 2019-04-18 | Disposition: A | Discharge status: Home / Self Care | Payer: BC Managed Care – PPO | Source: Ambulatory Visit | Attending: Radiation Oncology | Admitting: Radiation Oncology

## 2019-04-18 ENCOUNTER — Other Ambulatory Visit: Payer: Self-pay | Admitting: Hematology and Oncology

## 2019-04-18 ENCOUNTER — Inpatient Hospital Stay (HOSPITAL_BASED_OUTPATIENT_CLINIC_OR_DEPARTMENT_OTHER): Payer: BC Managed Care – PPO | Admitting: Hematology and Oncology

## 2019-04-18 ENCOUNTER — Inpatient Hospital Stay: Payer: BC Managed Care – PPO | Attending: Hematology and Oncology

## 2019-04-18 ENCOUNTER — Other Ambulatory Visit: Payer: Self-pay

## 2019-04-18 DIAGNOSIS — Z170 Estrogen receptor positive status [ER+]: Secondary | ICD-10-CM | POA: Diagnosis not present

## 2019-04-18 DIAGNOSIS — C50211 Malignant neoplasm of upper-inner quadrant of right female breast: Secondary | ICD-10-CM

## 2019-04-18 DIAGNOSIS — Z9221 Personal history of antineoplastic chemotherapy: Secondary | ICD-10-CM | POA: Diagnosis not present

## 2019-04-18 DIAGNOSIS — Z5112 Encounter for antineoplastic immunotherapy: Secondary | ICD-10-CM | POA: Diagnosis not present

## 2019-04-18 DIAGNOSIS — Z510 Encounter for antineoplastic radiation therapy: Secondary | ICD-10-CM | POA: Diagnosis not present

## 2019-04-18 DIAGNOSIS — Z95828 Presence of other vascular implants and grafts: Secondary | ICD-10-CM

## 2019-04-18 DIAGNOSIS — Z79899 Other long term (current) drug therapy: Secondary | ICD-10-CM | POA: Diagnosis not present

## 2019-04-18 LAB — CBC WITH DIFFERENTIAL (CANCER CENTER ONLY)
Abs Immature Granulocytes: 0.01 10*3/uL (ref 0.00–0.07)
Basophils Absolute: 0 10*3/uL (ref 0.0–0.1)
Basophils Relative: 1 %
Eosinophils Absolute: 0.2 10*3/uL (ref 0.0–0.5)
Eosinophils Relative: 3 %
HCT: 38.2 % (ref 36.0–46.0)
Hemoglobin: 12 g/dL (ref 12.0–15.0)
Immature Granulocytes: 0 %
Lymphocytes Relative: 19 %
Lymphs Abs: 1 10*3/uL (ref 0.7–4.0)
MCH: 29.6 pg (ref 26.0–34.0)
MCHC: 31.4 g/dL (ref 30.0–36.0)
MCV: 94.3 fL (ref 80.0–100.0)
Monocytes Absolute: 0.5 10*3/uL (ref 0.1–1.0)
Monocytes Relative: 10 %
Neutro Abs: 3.6 10*3/uL (ref 1.7–7.7)
Neutrophils Relative %: 67 %
Platelet Count: 353 10*3/uL (ref 150–400)
RBC: 4.05 MIL/uL (ref 3.87–5.11)
RDW: 13.5 % (ref 11.5–15.5)
WBC Count: 5.3 10*3/uL (ref 4.0–10.5)
nRBC: 0 % (ref 0.0–0.2)

## 2019-04-18 LAB — CMP (CANCER CENTER ONLY)
ALT: 18 U/L (ref 0–44)
AST: 19 U/L (ref 15–41)
Albumin: 3.7 g/dL (ref 3.5–5.0)
Alkaline Phosphatase: 111 U/L (ref 38–126)
Anion gap: 9 (ref 5–15)
BUN: 15 mg/dL (ref 8–23)
CO2: 25 mmol/L (ref 22–32)
Calcium: 8.9 mg/dL (ref 8.9–10.3)
Chloride: 105 mmol/L (ref 98–111)
Creatinine: 0.77 mg/dL (ref 0.44–1.00)
GFR, Est AFR Am: >60 mL/min (ref >60)
GFR, Estimated: >60 mL/min (ref >60)
Glucose, Bld: 97 mg/dL (ref 70–99)
Potassium: 4.6 mmol/L (ref 3.5–5.1)
Sodium: 139 mmol/L (ref 135–145)
Total Bilirubin: 0.4 mg/dL (ref 0.3–1.2)
Total Protein: 7.3 g/dL (ref 6.5–8.1)

## 2019-04-18 MED ORDER — ACETAMINOPHEN 325 MG PO TABS
650.0000 mg | ORAL_TABLET | Freq: Once | ORAL | Status: AC
  Administered 2019-04-18: 650 mg via ORAL

## 2019-04-18 MED ORDER — SODIUM CHLORIDE 0.9 % IV SOLN
3.6000 mg/kg | Freq: Once | INTRAVENOUS | Status: AC
  Administered 2019-04-18: 300 mg via INTRAVENOUS
  Filled 2019-04-18: qty 15

## 2019-04-18 MED ORDER — SODIUM CHLORIDE 0.9% FLUSH
10.0000 mL | Freq: Once | INTRAVENOUS | Status: AC
  Administered 2019-04-18: 10 mL
  Filled 2019-04-18: qty 10

## 2019-04-18 MED ORDER — DIPHENHYDRAMINE HCL 25 MG PO CAPS
ORAL_CAPSULE | ORAL | Status: AC
  Filled 2019-04-18: qty 1

## 2019-04-18 MED ORDER — ACETAMINOPHEN 325 MG PO TABS
ORAL_TABLET | ORAL | Status: AC
  Filled 2019-04-18: qty 2

## 2019-04-18 MED ORDER — DIPHENHYDRAMINE HCL 25 MG PO CAPS
50.0000 mg | ORAL_CAPSULE | Freq: Once | ORAL | Status: AC
  Administered 2019-04-18: 25 mg via ORAL

## 2019-04-18 MED ORDER — SODIUM CHLORIDE 0.9% FLUSH
10.0000 mL | INTRAVENOUS | Status: DC | PRN
  Administered 2019-04-18: 10 mL
  Filled 2019-04-18: qty 10

## 2019-04-18 MED ORDER — SODIUM CHLORIDE 0.9 % IV SOLN
Freq: Once | INTRAVENOUS | Status: AC
  Administered 2019-04-18: 10:00:00 via INTRAVENOUS
  Filled 2019-04-18: qty 250

## 2019-04-18 MED ORDER — HEPARIN SOD (PORK) LOCK FLUSH 100 UNIT/ML IV SOLN
500.0000 [IU] | Freq: Once | INTRAVENOUS | Status: AC | PRN
  Administered 2019-04-18: 500 [IU]
  Filled 2019-04-18: qty 5

## 2019-04-18 NOTE — Progress Notes (Signed)
Okay to give Benadryl 25 mg capsule today

## 2019-04-18 NOTE — Patient Instructions (Signed)
[facility] Discharge Instructions for Patients Receiving Chemotherapy  Today you received the following chemotherapy agents :  Kadcyla.  To help prevent nausea and vomiting after your treatment, we encourage you to take your nausea medication as prescribed.   If you develop nausea and vomiting that is not controlled by your nausea medication, call the clinic.   BELOW ARE SYMPTOMS THAT SHOULD BE REPORTED IMMEDIATELY:  *FEVER GREATER THAN 100.5 F  *CHILLS WITH OR WITHOUT FEVER  NAUSEA AND VOMITING THAT IS NOT CONTROLLED WITH YOUR NAUSEA MEDICATION  *UNUSUAL SHORTNESS OF BREATH  *UNUSUAL BRUISING OR BLEEDING  TENDERNESS IN MOUTH AND THROAT WITH OR WITHOUT PRESENCE OF ULCERS  *URINARY PROBLEMS  *BOWEL PROBLEMS  UNUSUAL RASH Items with * indicate a potential emergency and should be followed up as soon as possible.  Feel free to call the clinic should you have any questions or concerns. The clinic phone number is [phone].  Please show the [facility] at check-in to the Emergency Department and triage nurse.   Ado-Trastuzumab Emtansine for injection What is this medicine? ADO-TRASTUZUMAB EMTANSINE (ADD oh traz TOO zuh mab em TAN zine) is a monoclonal antibody combined with chemotherapy. It is used to treat breast cancer. This medicine may be used for other purposes; ask your health care provider or pharmacist if you have questions. COMMON BRAND NAME(S): Kadcyla What should I tell my health care provider before I take this medicine? They need to know if you have any of these conditions:  heart disease  heart failure  infection (especially a virus infection such as chickenpox, cold sores, or herpes)  liver disease  lung or breathing disease, like asthma  tingling of the fingers or toes, or other nerve disorder  an unusual or allergic reaction to ado-trastuzumab emtansine, other medications, foods, dyes, or preservatives  pregnant or trying to  get pregnant  breast-feeding How should I use this medicine? This medicine is for infusion into a vein. It is given by a health care professional in a hospital or clinic setting. Talk to your pediatrician regarding the use of this medicine in children. Special care may be needed. Overdosage: If you think you have taken too much of this medicine contact a poison control center or emergency room at once. NOTE: This medicine is only for you. Do not share this medicine with others. What if I miss a dose? It is important not to miss your dose. Call your doctor or health care professional if you are unable to keep an appointment. What may interact with this medicine? This medicine may also interact with the following medications:  atazanavir  boceprevir  clarithromycin  delavirdine  indinavir  dalfopristin; quinupristin  isoniazid, INH  itraconazole  ketoconazole  nefazodone  nelfinavir  ritonavir  telaprevir  telithromycin  tipranavir  voriconazole This list may not describe all possible interactions. Give your health care provider a list of all the medicines, herbs, non-prescription drugs, or dietary supplements you use. Also tell them if you smoke, drink alcohol, or use illegal drugs. Some items may interact with your medicine. What should I watch for while using this medicine? Visit your doctor for checks on your progress. This drug may make you feel generally unwell. This is not uncommon, as chemotherapy can affect healthy cells as well as cancer cells. Report any side effects. Continue your course of treatment even though you feel ill unless your doctor tells you to stop. You may need blood work done while you are  taking this medicine. Call your doctor or health care professional for advice if you get a fever, chills or sore throat, or other symptoms of a cold or flu. Do not treat yourself. This drug decreases your body's ability to fight infections. Try to avoid being  around people who are sick. Be careful brushing and flossing your teeth or using a toothpick because you may get an infection or bleed more easily. If you have any dental work done, tell your dentist you are receiving this medicine. Avoid taking products that contain aspirin, acetaminophen, ibuprofen, naproxen, or ketoprofen unless instructed by your doctor. These medicines may hide a fever. Do not become pregnant while taking this medicine or for 7 months after stopping it, men with female partners should use contraception during treatment and for 4 months after the last dose. Women should inform their doctor if they wish to become pregnant or think they might be pregnant. There is a potential for serious side effects to an unborn child. Do not breast-feed an infant while taking this medicine or for 7 months after the last dose. Men who have a partner who is pregnant or who is capable of becoming pregnant should use a condom during sexual activity while taking this medicine and for 4 months after stopping it. Men should inform their doctors if they wish to father a child. This medicine may lower sperm counts. Talk to your health care professional or pharmacist for more information. What side effects may I notice from receiving this medicine? Side effects that you should report to your doctor or health care professional as soon as possible:  allergic reactions like skin rash, itching or hives, swelling of the face, lips, or tongue  breathing problems  chest pain or palpitations  fever or chills, sore throat  general ill feeling or flu-like symptoms  light-colored stools  nausea, vomiting  pain, tingling, numbness in the hands or feet  signs and symptoms of bleeding such as bloody or black, tarry stools; red or dark-brown urine; spitting up blood or brown material that looks like coffee grounds; red spots on the skin; unusual bruising or bleeding from the eye, gums, or nose  swelling of the  legs or ankles  yellowing of the eyes or skin Side effects that usually do not require medical attention (report to your doctor or health care professional if they continue or are bothersome):  changes in taste  constipation  dizziness  headache  joint pain  muscle pain  trouble sleeping  unusually weak or tired This list may not describe all possible side effects. Call your doctor for medical advice about side effects. You may report side effects to FDA at 1-800-FDA-1088. Where should I keep my medicine? This drug is given in a hospital or clinic and will not be stored at home. NOTE: This sheet is a summary. It may not cover all possible information. If you have questions about this medicine, talk to your doctor, pharmacist, or health care provider.  2020 Elsevier/Gold Standard (2018-02-23 10:03:15)

## 2019-04-19 ENCOUNTER — Other Ambulatory Visit: Payer: Self-pay

## 2019-04-19 ENCOUNTER — Ambulatory Visit
Admission: RE | Admit: 2019-04-19 | Discharge: 2019-04-19 | Disposition: A | Discharge status: Home / Self Care | Payer: BC Managed Care – PPO | Source: Ambulatory Visit | Attending: Radiation Oncology | Admitting: Radiation Oncology

## 2019-04-19 DIAGNOSIS — Z170 Estrogen receptor positive status [ER+]: Secondary | ICD-10-CM | POA: Diagnosis not present

## 2019-04-19 DIAGNOSIS — Z79899 Other long term (current) drug therapy: Secondary | ICD-10-CM | POA: Diagnosis not present

## 2019-04-19 DIAGNOSIS — Z510 Encounter for antineoplastic radiation therapy: Secondary | ICD-10-CM | POA: Diagnosis not present

## 2019-04-19 DIAGNOSIS — C50211 Malignant neoplasm of upper-inner quadrant of right female breast: Secondary | ICD-10-CM | POA: Diagnosis not present

## 2019-04-19 DIAGNOSIS — Z9221 Personal history of antineoplastic chemotherapy: Secondary | ICD-10-CM | POA: Diagnosis not present

## 2019-04-22 ENCOUNTER — Other Ambulatory Visit: Payer: Self-pay

## 2019-04-22 ENCOUNTER — Ambulatory Visit
Admission: RE | Admit: 2019-04-22 | Discharge: 2019-04-22 | Disposition: A | Discharge status: Home / Self Care | Payer: BC Managed Care – PPO | Source: Ambulatory Visit | Attending: Radiation Oncology | Admitting: Radiation Oncology

## 2019-04-22 DIAGNOSIS — Z79899 Other long term (current) drug therapy: Secondary | ICD-10-CM | POA: Diagnosis not present

## 2019-04-22 DIAGNOSIS — Z170 Estrogen receptor positive status [ER+]: Secondary | ICD-10-CM | POA: Diagnosis not present

## 2019-04-22 DIAGNOSIS — Z510 Encounter for antineoplastic radiation therapy: Secondary | ICD-10-CM | POA: Diagnosis not present

## 2019-04-22 DIAGNOSIS — Z9221 Personal history of antineoplastic chemotherapy: Secondary | ICD-10-CM | POA: Diagnosis not present

## 2019-04-22 DIAGNOSIS — C50211 Malignant neoplasm of upper-inner quadrant of right female breast: Secondary | ICD-10-CM | POA: Diagnosis not present

## 2019-04-23 ENCOUNTER — Ambulatory Visit
Admission: RE | Admit: 2019-04-23 | Discharge: 2019-04-23 | Disposition: A | Discharge status: Home / Self Care | Payer: BC Managed Care – PPO | Source: Ambulatory Visit | Attending: Radiation Oncology | Admitting: Radiation Oncology

## 2019-04-23 ENCOUNTER — Other Ambulatory Visit: Payer: Self-pay

## 2019-04-23 DIAGNOSIS — Z510 Encounter for antineoplastic radiation therapy: Secondary | ICD-10-CM | POA: Diagnosis not present

## 2019-04-23 DIAGNOSIS — Z9221 Personal history of antineoplastic chemotherapy: Secondary | ICD-10-CM | POA: Diagnosis not present

## 2019-04-23 DIAGNOSIS — C50211 Malignant neoplasm of upper-inner quadrant of right female breast: Secondary | ICD-10-CM | POA: Diagnosis not present

## 2019-04-23 DIAGNOSIS — Z170 Estrogen receptor positive status [ER+]: Secondary | ICD-10-CM | POA: Diagnosis not present

## 2019-04-23 DIAGNOSIS — Z79899 Other long term (current) drug therapy: Secondary | ICD-10-CM | POA: Diagnosis not present

## 2019-04-24 ENCOUNTER — Other Ambulatory Visit: Payer: Self-pay

## 2019-04-24 ENCOUNTER — Ambulatory Visit
Admission: RE | Admit: 2019-04-24 | Discharge: 2019-04-24 | Disposition: A | Discharge status: Home / Self Care | Payer: BC Managed Care – PPO | Source: Ambulatory Visit | Attending: Radiation Oncology | Admitting: Radiation Oncology

## 2019-04-24 DIAGNOSIS — Z9221 Personal history of antineoplastic chemotherapy: Secondary | ICD-10-CM | POA: Diagnosis not present

## 2019-04-24 DIAGNOSIS — Z170 Estrogen receptor positive status [ER+]: Secondary | ICD-10-CM | POA: Diagnosis not present

## 2019-04-24 DIAGNOSIS — C50211 Malignant neoplasm of upper-inner quadrant of right female breast: Secondary | ICD-10-CM | POA: Diagnosis not present

## 2019-04-24 DIAGNOSIS — Z510 Encounter for antineoplastic radiation therapy: Secondary | ICD-10-CM | POA: Diagnosis not present

## 2019-04-24 DIAGNOSIS — Z79899 Other long term (current) drug therapy: Secondary | ICD-10-CM | POA: Diagnosis not present

## 2019-04-25 ENCOUNTER — Other Ambulatory Visit: Payer: Self-pay

## 2019-04-25 ENCOUNTER — Ambulatory Visit
Admission: RE | Admit: 2019-04-25 | Discharge: 2019-04-25 | Disposition: A | Discharge status: Home / Self Care | Payer: BC Managed Care – PPO | Source: Ambulatory Visit | Attending: Radiation Oncology | Admitting: Radiation Oncology

## 2019-04-25 DIAGNOSIS — Z9221 Personal history of antineoplastic chemotherapy: Secondary | ICD-10-CM | POA: Diagnosis not present

## 2019-04-25 DIAGNOSIS — C50211 Malignant neoplasm of upper-inner quadrant of right female breast: Secondary | ICD-10-CM | POA: Diagnosis not present

## 2019-04-25 DIAGNOSIS — Z170 Estrogen receptor positive status [ER+]: Secondary | ICD-10-CM | POA: Diagnosis not present

## 2019-04-25 DIAGNOSIS — Z510 Encounter for antineoplastic radiation therapy: Secondary | ICD-10-CM | POA: Diagnosis not present

## 2019-04-25 DIAGNOSIS — Z79899 Other long term (current) drug therapy: Secondary | ICD-10-CM | POA: Diagnosis not present

## 2019-04-26 ENCOUNTER — Ambulatory Visit
Admission: RE | Admit: 2019-04-26 | Discharge: 2019-04-26 | Disposition: A | Discharge status: Home / Self Care | Payer: BC Managed Care – PPO | Source: Ambulatory Visit | Attending: Radiation Oncology | Admitting: Radiation Oncology

## 2019-04-26 ENCOUNTER — Other Ambulatory Visit: Payer: Self-pay

## 2019-04-26 DIAGNOSIS — Z9221 Personal history of antineoplastic chemotherapy: Secondary | ICD-10-CM | POA: Diagnosis not present

## 2019-04-26 DIAGNOSIS — Z79899 Other long term (current) drug therapy: Secondary | ICD-10-CM | POA: Diagnosis not present

## 2019-04-26 DIAGNOSIS — Z510 Encounter for antineoplastic radiation therapy: Secondary | ICD-10-CM | POA: Diagnosis not present

## 2019-04-26 DIAGNOSIS — Z170 Estrogen receptor positive status [ER+]: Secondary | ICD-10-CM | POA: Diagnosis not present

## 2019-04-26 DIAGNOSIS — C50211 Malignant neoplasm of upper-inner quadrant of right female breast: Secondary | ICD-10-CM | POA: Diagnosis not present

## 2019-04-29 ENCOUNTER — Ambulatory Visit
Admission: RE | Admit: 2019-04-29 | Discharge: 2019-04-29 | Disposition: A | Discharge status: Home / Self Care | Payer: BC Managed Care – PPO | Source: Ambulatory Visit | Attending: Radiation Oncology | Admitting: Radiation Oncology

## 2019-04-29 ENCOUNTER — Other Ambulatory Visit: Payer: Self-pay

## 2019-04-29 DIAGNOSIS — Z170 Estrogen receptor positive status [ER+]: Secondary | ICD-10-CM | POA: Diagnosis not present

## 2019-04-29 DIAGNOSIS — C50211 Malignant neoplasm of upper-inner quadrant of right female breast: Secondary | ICD-10-CM | POA: Diagnosis not present

## 2019-04-29 DIAGNOSIS — Z79899 Other long term (current) drug therapy: Secondary | ICD-10-CM | POA: Diagnosis not present

## 2019-04-29 DIAGNOSIS — Z510 Encounter for antineoplastic radiation therapy: Secondary | ICD-10-CM | POA: Diagnosis not present

## 2019-04-29 DIAGNOSIS — Z9221 Personal history of antineoplastic chemotherapy: Secondary | ICD-10-CM | POA: Diagnosis not present

## 2019-04-29 MED ORDER — RADIAPLEXRX EX GEL
Freq: Once | CUTANEOUS | Status: AC
  Administered 2019-04-29: 09:00:00 via TOPICAL

## 2019-04-30 ENCOUNTER — Ambulatory Visit
Admission: RE | Admit: 2019-04-30 | Discharge: 2019-04-30 | Disposition: A | Discharge status: Home / Self Care | Payer: BC Managed Care – PPO | Source: Ambulatory Visit | Attending: Radiation Oncology | Admitting: Radiation Oncology

## 2019-04-30 ENCOUNTER — Other Ambulatory Visit: Payer: Self-pay

## 2019-04-30 DIAGNOSIS — Z170 Estrogen receptor positive status [ER+]: Secondary | ICD-10-CM | POA: Diagnosis not present

## 2019-04-30 DIAGNOSIS — Z510 Encounter for antineoplastic radiation therapy: Secondary | ICD-10-CM | POA: Diagnosis not present

## 2019-04-30 DIAGNOSIS — Z9221 Personal history of antineoplastic chemotherapy: Secondary | ICD-10-CM | POA: Diagnosis not present

## 2019-04-30 DIAGNOSIS — C50211 Malignant neoplasm of upper-inner quadrant of right female breast: Secondary | ICD-10-CM | POA: Diagnosis not present

## 2019-04-30 DIAGNOSIS — Z79899 Other long term (current) drug therapy: Secondary | ICD-10-CM | POA: Diagnosis not present

## 2019-05-01 ENCOUNTER — Other Ambulatory Visit: Payer: Self-pay

## 2019-05-01 ENCOUNTER — Ambulatory Visit
Admission: RE | Admit: 2019-05-01 | Discharge: 2019-05-01 | Disposition: A | Discharge status: Home / Self Care | Payer: BC Managed Care – PPO | Source: Ambulatory Visit | Attending: Radiation Oncology | Admitting: Radiation Oncology

## 2019-05-01 DIAGNOSIS — Z79899 Other long term (current) drug therapy: Secondary | ICD-10-CM | POA: Diagnosis not present

## 2019-05-01 DIAGNOSIS — C50211 Malignant neoplasm of upper-inner quadrant of right female breast: Secondary | ICD-10-CM | POA: Diagnosis not present

## 2019-05-01 DIAGNOSIS — Z510 Encounter for antineoplastic radiation therapy: Secondary | ICD-10-CM | POA: Diagnosis not present

## 2019-05-01 DIAGNOSIS — Z170 Estrogen receptor positive status [ER+]: Secondary | ICD-10-CM | POA: Diagnosis not present

## 2019-05-01 DIAGNOSIS — Z9221 Personal history of antineoplastic chemotherapy: Secondary | ICD-10-CM | POA: Diagnosis not present

## 2019-05-02 ENCOUNTER — Ambulatory Visit
Admission: RE | Admit: 2019-05-02 | Discharge: 2019-05-02 | Disposition: A | Discharge status: Home / Self Care | Payer: BC Managed Care – PPO | Source: Ambulatory Visit | Attending: Radiation Oncology | Admitting: Radiation Oncology

## 2019-05-02 ENCOUNTER — Other Ambulatory Visit: Payer: Self-pay

## 2019-05-02 DIAGNOSIS — Z510 Encounter for antineoplastic radiation therapy: Secondary | ICD-10-CM | POA: Diagnosis not present

## 2019-05-02 DIAGNOSIS — C50911 Malignant neoplasm of unspecified site of right female breast: Secondary | ICD-10-CM | POA: Diagnosis not present

## 2019-05-02 DIAGNOSIS — C50211 Malignant neoplasm of upper-inner quadrant of right female breast: Secondary | ICD-10-CM | POA: Diagnosis not present

## 2019-05-02 DIAGNOSIS — Z79899 Other long term (current) drug therapy: Secondary | ICD-10-CM | POA: Diagnosis not present

## 2019-05-02 DIAGNOSIS — Z9221 Personal history of antineoplastic chemotherapy: Secondary | ICD-10-CM | POA: Diagnosis not present

## 2019-05-02 DIAGNOSIS — Z170 Estrogen receptor positive status [ER+]: Secondary | ICD-10-CM | POA: Diagnosis not present

## 2019-05-03 ENCOUNTER — Ambulatory Visit
Admission: RE | Admit: 2019-05-03 | Discharge: 2019-05-03 | Disposition: A | Discharge status: Home / Self Care | Payer: BC Managed Care – PPO | Source: Ambulatory Visit | Attending: Radiation Oncology | Admitting: Radiation Oncology

## 2019-05-03 ENCOUNTER — Other Ambulatory Visit: Payer: Self-pay

## 2019-05-03 DIAGNOSIS — Z9221 Personal history of antineoplastic chemotherapy: Secondary | ICD-10-CM | POA: Diagnosis not present

## 2019-05-03 DIAGNOSIS — Z79899 Other long term (current) drug therapy: Secondary | ICD-10-CM | POA: Diagnosis not present

## 2019-05-03 DIAGNOSIS — Z510 Encounter for antineoplastic radiation therapy: Secondary | ICD-10-CM | POA: Diagnosis not present

## 2019-05-03 DIAGNOSIS — Z170 Estrogen receptor positive status [ER+]: Secondary | ICD-10-CM | POA: Diagnosis not present

## 2019-05-03 DIAGNOSIS — C50211 Malignant neoplasm of upper-inner quadrant of right female breast: Secondary | ICD-10-CM | POA: Diagnosis not present

## 2019-05-06 ENCOUNTER — Other Ambulatory Visit: Payer: Self-pay

## 2019-05-06 ENCOUNTER — Ambulatory Visit
Admission: RE | Admit: 2019-05-06 | Discharge: 2019-05-06 | Disposition: A | Discharge status: Home / Self Care | Payer: BC Managed Care – PPO | Source: Ambulatory Visit | Attending: Radiation Oncology | Admitting: Radiation Oncology

## 2019-05-06 ENCOUNTER — Encounter: Payer: Self-pay | Admitting: Radiation Oncology

## 2019-05-06 ENCOUNTER — Encounter: Payer: Self-pay | Admitting: *Deleted

## 2019-05-06 DIAGNOSIS — Z510 Encounter for antineoplastic radiation therapy: Secondary | ICD-10-CM | POA: Diagnosis not present

## 2019-05-06 DIAGNOSIS — Z9221 Personal history of antineoplastic chemotherapy: Secondary | ICD-10-CM | POA: Diagnosis not present

## 2019-05-06 DIAGNOSIS — Z79899 Other long term (current) drug therapy: Secondary | ICD-10-CM | POA: Diagnosis not present

## 2019-05-06 DIAGNOSIS — Z170 Estrogen receptor positive status [ER+]: Secondary | ICD-10-CM | POA: Diagnosis not present

## 2019-05-06 DIAGNOSIS — C50211 Malignant neoplasm of upper-inner quadrant of right female breast: Secondary | ICD-10-CM | POA: Diagnosis not present

## 2019-05-09 ENCOUNTER — Inpatient Hospital Stay: Payer: BC Managed Care – PPO

## 2019-05-09 ENCOUNTER — Other Ambulatory Visit: Payer: Self-pay

## 2019-05-09 VITALS — BP 122/83 | HR 70 | Temp 98.3°F | Resp 16

## 2019-05-09 DIAGNOSIS — C50211 Malignant neoplasm of upper-inner quadrant of right female breast: Secondary | ICD-10-CM | POA: Diagnosis not present

## 2019-05-09 DIAGNOSIS — Z5112 Encounter for antineoplastic immunotherapy: Secondary | ICD-10-CM | POA: Diagnosis not present

## 2019-05-09 DIAGNOSIS — Z95828 Presence of other vascular implants and grafts: Secondary | ICD-10-CM

## 2019-05-09 DIAGNOSIS — Z170 Estrogen receptor positive status [ER+]: Secondary | ICD-10-CM | POA: Diagnosis not present

## 2019-05-09 LAB — CBC WITH DIFFERENTIAL (CANCER CENTER ONLY)
Abs Immature Granulocytes: 0.02 10*3/uL (ref 0.00–0.07)
Basophils Absolute: 0.1 10*3/uL (ref 0.0–0.1)
Basophils Relative: 1 %
Eosinophils Absolute: 0.2 10*3/uL (ref 0.0–0.5)
Eosinophils Relative: 4 %
HCT: 37.8 % (ref 36.0–46.0)
Hemoglobin: 12 g/dL (ref 12.0–15.0)
Immature Granulocytes: 0 %
Lymphocytes Relative: 16 %
Lymphs Abs: 0.9 10*3/uL (ref 0.7–4.0)
MCH: 29.6 pg (ref 26.0–34.0)
MCHC: 31.7 g/dL (ref 30.0–36.0)
MCV: 93.1 fL (ref 80.0–100.0)
Monocytes Absolute: 0.6 10*3/uL (ref 0.1–1.0)
Monocytes Relative: 10 %
Neutro Abs: 3.9 10*3/uL (ref 1.7–7.7)
Neutrophils Relative %: 69 %
Platelet Count: 285 10*3/uL (ref 150–400)
RBC: 4.06 MIL/uL (ref 3.87–5.11)
RDW: 14.6 % (ref 11.5–15.5)
WBC Count: 5.6 10*3/uL (ref 4.0–10.5)
nRBC: 0 % (ref 0.0–0.2)

## 2019-05-09 LAB — CMP (CANCER CENTER ONLY)
ALT: 32 U/L (ref 0–44)
AST: 28 U/L (ref 15–41)
Albumin: 3.6 g/dL (ref 3.5–5.0)
Alkaline Phosphatase: 127 U/L — ABNORMAL HIGH (ref 38–126)
Anion gap: 11 (ref 5–15)
BUN: 20 mg/dL (ref 8–23)
CO2: 23 mmol/L (ref 22–32)
Calcium: 9.3 mg/dL (ref 8.9–10.3)
Chloride: 106 mmol/L (ref 98–111)
Creatinine: 0.76 mg/dL (ref 0.44–1.00)
GFR, Est AFR Am: >60 mL/min (ref >60)
GFR, Estimated: >60 mL/min (ref >60)
Glucose, Bld: 93 mg/dL (ref 70–99)
Potassium: 4.4 mmol/L (ref 3.5–5.1)
Sodium: 140 mmol/L (ref 135–145)
Total Bilirubin: 0.5 mg/dL (ref 0.3–1.2)
Total Protein: 7.4 g/dL (ref 6.5–8.1)

## 2019-05-09 MED ORDER — DIPHENHYDRAMINE HCL 25 MG PO CAPS
50.0000 mg | ORAL_CAPSULE | Freq: Once | ORAL | Status: AC
  Administered 2019-05-09: 50 mg via ORAL

## 2019-05-09 MED ORDER — SODIUM CHLORIDE 0.9 % IV SOLN
Freq: Once | INTRAVENOUS | Status: AC
  Administered 2019-05-09: 09:00:00 via INTRAVENOUS
  Filled 2019-05-09: qty 250

## 2019-05-09 MED ORDER — ACETAMINOPHEN 325 MG PO TABS
ORAL_TABLET | ORAL | Status: AC
  Filled 2019-05-09: qty 2

## 2019-05-09 MED ORDER — SODIUM CHLORIDE 0.9 % IV SOLN
3.6000 mg/kg | Freq: Once | INTRAVENOUS | Status: AC
  Administered 2019-05-09: 300 mg via INTRAVENOUS
  Filled 2019-05-09: qty 15

## 2019-05-09 MED ORDER — ACETAMINOPHEN 325 MG PO TABS
650.0000 mg | ORAL_TABLET | Freq: Once | ORAL | Status: AC
  Administered 2019-05-09: 650 mg via ORAL

## 2019-05-09 MED ORDER — HEPARIN SOD (PORK) LOCK FLUSH 100 UNIT/ML IV SOLN
500.0000 [IU] | Freq: Once | INTRAVENOUS | Status: AC | PRN
  Administered 2019-05-09: 500 [IU]
  Filled 2019-05-09: qty 5

## 2019-05-09 MED ORDER — SODIUM CHLORIDE 0.9% FLUSH
10.0000 mL | Freq: Once | INTRAVENOUS | Status: AC
  Administered 2019-05-09: 10 mL
  Filled 2019-05-09: qty 10

## 2019-05-09 MED ORDER — SODIUM CHLORIDE 0.9% FLUSH
10.0000 mL | INTRAVENOUS | Status: DC | PRN
  Administered 2019-05-09: 10 mL
  Filled 2019-05-09: qty 10

## 2019-05-09 MED ORDER — DIPHENHYDRAMINE HCL 25 MG PO CAPS
ORAL_CAPSULE | ORAL | Status: AC
  Filled 2019-05-09: qty 2

## 2019-05-09 NOTE — Patient Instructions (Signed)
[facility] Discharge Instructions for Patients Receiving Chemotherapy  Today you received the following chemotherapy agents :  Kadcyla.  To help prevent nausea and vomiting after your treatment, we encourage you to take your nausea medication as prescribed.   If you develop nausea and vomiting that is not controlled by your nausea medication, call the clinic.   BELOW ARE SYMPTOMS THAT SHOULD BE REPORTED IMMEDIATELY:  *FEVER GREATER THAN 100.5 F  *CHILLS WITH OR WITHOUT FEVER  NAUSEA AND VOMITING THAT IS NOT CONTROLLED WITH YOUR NAUSEA MEDICATION  *UNUSUAL SHORTNESS OF BREATH  *UNUSUAL BRUISING OR BLEEDING  TENDERNESS IN MOUTH AND THROAT WITH OR WITHOUT PRESENCE OF ULCERS  *URINARY PROBLEMS  *BOWEL PROBLEMS  UNUSUAL RASH Items with * indicate a potential emergency and should be followed up as soon as possible.  Feel free to call the clinic should you have any questions or concerns. The clinic phone number is [phone].  Please show the [facility] at check-in to the Emergency Department and triage nurse.   Ado-Trastuzumab Emtansine for injection What is this medicine? ADO-TRASTUZUMAB EMTANSINE (ADD oh traz TOO zuh mab em TAN zine) is a monoclonal antibody combined with chemotherapy. It is used to treat breast cancer. This medicine may be used for other purposes; ask your health care provider or pharmacist if you have questions. COMMON BRAND NAME(S): Kadcyla What should I tell my health care provider before I take this medicine? They need to know if you have any of these conditions:  heart disease  heart failure  infection (especially a virus infection such as chickenpox, cold sores, or herpes)  liver disease  lung or breathing disease, like asthma  tingling of the fingers or toes, or other nerve disorder  an unusual or allergic reaction to ado-trastuzumab emtansine, other medications, foods, dyes, or preservatives  pregnant or trying to  get pregnant  breast-feeding How should I use this medicine? This medicine is for infusion into a vein. It is given by a health care professional in a hospital or clinic setting. Talk to your pediatrician regarding the use of this medicine in children. Special care may be needed. Overdosage: If you think you have taken too much of this medicine contact a poison control center or emergency room at once. NOTE: This medicine is only for you. Do not share this medicine with others. What if I miss a dose? It is important not to miss your dose. Call your doctor or health care professional if you are unable to keep an appointment. What may interact with this medicine? This medicine may also interact with the following medications:  atazanavir  boceprevir  clarithromycin  delavirdine  indinavir  dalfopristin; quinupristin  isoniazid, INH  itraconazole  ketoconazole  nefazodone  nelfinavir  ritonavir  telaprevir  telithromycin  tipranavir  voriconazole This list may not describe all possible interactions. Give your health care provider a list of all the medicines, herbs, non-prescription drugs, or dietary supplements you use. Also tell them if you smoke, drink alcohol, or use illegal drugs. Some items may interact with your medicine. What should I watch for while using this medicine? Visit your doctor for checks on your progress. This drug may make you feel generally unwell. This is not uncommon, as chemotherapy can affect healthy cells as well as cancer cells. Report any side effects. Continue your course of treatment even though you feel ill unless your doctor tells you to stop. You may need blood work done while you are  taking this medicine. Call your doctor or health care professional for advice if you get a fever, chills or sore throat, or other symptoms of a cold or flu. Do not treat yourself. This drug decreases your body's ability to fight infections. Try to avoid being  around people who are sick. Be careful brushing and flossing your teeth or using a toothpick because you may get an infection or bleed more easily. If you have any dental work done, tell your dentist you are receiving this medicine. Avoid taking products that contain aspirin, acetaminophen, ibuprofen, naproxen, or ketoprofen unless instructed by your doctor. These medicines may hide a fever. Do not become pregnant while taking this medicine or for 7 months after stopping it, men with female partners should use contraception during treatment and for 4 months after the last dose. Women should inform their doctor if they wish to become pregnant or think they might be pregnant. There is a potential for serious side effects to an unborn child. Do not breast-feed an infant while taking this medicine or for 7 months after the last dose. Men who have a partner who is pregnant or who is capable of becoming pregnant should use a condom during sexual activity while taking this medicine and for 4 months after stopping it. Men should inform their doctors if they wish to father a child. This medicine may lower sperm counts. Talk to your health care professional or pharmacist for more information. What side effects may I notice from receiving this medicine? Side effects that you should report to your doctor or health care professional as soon as possible:  allergic reactions like skin rash, itching or hives, swelling of the face, lips, or tongue  breathing problems  chest pain or palpitations  fever or chills, sore throat  general ill feeling or flu-like symptoms  light-colored stools  nausea, vomiting  pain, tingling, numbness in the hands or feet  signs and symptoms of bleeding such as bloody or black, tarry stools; red or dark-brown urine; spitting up blood or brown material that looks like coffee grounds; red spots on the skin; unusual bruising or bleeding from the eye, gums, or nose  swelling of the  legs or ankles  yellowing of the eyes or skin Side effects that usually do not require medical attention (report to your doctor or health care professional if they continue or are bothersome):  changes in taste  constipation  dizziness  headache  joint pain  muscle pain  trouble sleeping  unusually weak or tired This list may not describe all possible side effects. Call your doctor for medical advice about side effects. You may report side effects to FDA at 1-800-FDA-1088. Where should I keep my medicine? This drug is given in a hospital or clinic and will not be stored at home. NOTE: This sheet is a summary. It may not cover all possible information. If you have questions about this medicine, talk to your doctor, pharmacist, or health care provider.  2020 Elsevier/Gold Standard (2018-02-23 10:03:15)

## 2019-05-23 NOTE — Assessment & Plan Note (Signed)
09/27/19:Two right breast masses 12:00 to 1 o'clock position 1.3 cm and 1.2 cm, they are 1.5 cm apart. 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 Neoadjuvant chemotherapy withTaxol Herceptin weekly x12completed 4/16/2020followed by Herceptin maintenance vsKadcylafor 1 year 2.03/08/2019: Right lumpectomy IDC s/p neoadjuvant treatment, grade 2, 0.7cm, ER+ 100%, PR+ 5%, HER2 equivocal, Ki67 20%, 0/2 right axillary sentinel lymph nodes negative for carcinoma, with clear margins. 3. Followed by adjuvant radiation therapy 4.Followed by antiestrogen therapy ------------------------------------------------------------------------------------------------------------------------------------------------------------ Treatment plan:Kadcyla maintenance therapy started 03/28/2019 Adjuvant radiation therapy: Started 04/09/2019 Kadcyla toxicities: Denies any side effects from Kadcyla.  Labs reviewed. Reducing the dosage of Benadryl to 25 mg Return to clinic every 3 weeks for Kadcyla and every 6 weeks for follow-up with me.

## 2019-05-29 NOTE — Progress Notes (Signed)
Patient Care Team: Chevis Pretty, FNP as PCP - General (Nurse Practitioner) Mauro Kaufmann, RN as Oncology Nurse Navigator Rockwell Germany, RN as Oncology Nurse Navigator  DIAGNOSIS:    ICD-10-CM   1. Malignant neoplasm of upper-inner quadrant of right breast in female, estrogen receptor positive ([place])  C50.211    Z17.0     SUMMARY OF ONCOLOGIC HISTORY: Oncology History  Malignant neoplasm of upper-inner quadrant of right breast in female, estrogen receptor positive ([place])  09/26/2018 Initial Diagnosis   Two right breast masses 12:00 to 1 o'clock position 1.3 cm and 1.2 cm, they are 1.5 cm apart.  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 The Hereditary Gene Panel offered by [facility] includes sequencing and/or deletion duplication testing of the following 48 genes: APC, ATM, AXIN2, BARD1, BMPR1A, BRCA1, BRCA2, BRIP1, CDH1, CDK4, CDKN2A (p14ARF), CDKN2A (p16INK4a), CHEK2, CTNNA1, DICER1, EPCAM (Deletion/duplication testing only), GREM1 (promoter region deletion/duplication testing only), KIT, MEN1, MLH1, MSH2, MSH3, MSH6, MUTYH, NBN, NF1, NHTL1, PALB2, PDGFRA, PMS2, POLD1, POLE, PTEN, RAD50, RAD51C, RAD51D, RNF43, SDHB, SDHC, SDHD, SMAD4, SMARCA4. STK11, TP53, TSC1, TSC2, and VHL.  The following genes were evaluated for sequence changes only: SDHA and HOXB13 c.251G>A variant only. The  report date is January 09, 2019.   03/08/2019 Surgery   Right lumpectomy [name]): IDC s/p neoadjuvant treatment, grade 2, 0.7cm, ER+ 100%, PR+ 5%, HER2 equivocal, Ki67 20%, 0/2 right axillary sentinel lymph nodes negative for carcinoma, with clear margins.    03/28/2019 -  Chemotherapy   The patient had ado-trastuzumab emtansine (KADCYLA) 300 mg in sodium chloride 0.9 % 250 mL chemo infusion, 3.6 mg/kg = 300 mg, Intravenous, Once, 3 of 11 cycles Administration: 300 mg (03/28/2019), 300 mg (04/18/2019), 300 mg (05/09/2019)  for chemotherapy treatment.    03/29/2019 Cancer Staging   Staging form: Breast, AJCC 8th Edition - Pathologic stage from 03/29/2019: No Stage Recommended (ypT1b, pN0, cM0, G2, ER+, PR+, HER2: Equivocal) - Signed by Eppie Gibson, MD on 03/30/2019   04/09/2019 - 05/06/2019 Radiation Therapy   Adjuvant XRT     CHIEF COMPLIANT: Kadcyla maintenance  INTERVAL HISTORY: Judith Thomas is a 63 y.o. with above-mentioned history of right breast cancer treated with neoadjuvant chemotherapy, lumpectomy, and radiation. She is a participant in the [employer] clinical trial. She presents to the clinic today for Kadcyla maintenance.   REVIEW OF SYSTEMS:   Constitutional: Denies fevers, chills or abnormal weight loss Eyes: Denies blurriness of vision Ears, nose, mouth, throat, and face: Denies mucositis or sore throat Respiratory: Denies cough, dyspnea or wheezes Cardiovascular: Denies palpitation, chest discomfort Gastrointestinal: Denies nausea, heartburn or change in bowel habits Skin: Denies abnormal skin rashes Lymphatics: Denies new lymphadenopathy or easy bruising Neurological: Denies numbness, tingling or new weaknesses Behavioral/Psych: Mood is stable, no new changes  Extremities: No lower extremity edema Breast: denies any pain  or lumps or nodules in either breasts All other systems were reviewed with the patient and are negative.  I have reviewed the past medical history, past  surgical history, social history and family history with the patient and they are unchanged from previous note.  ALLERGIES:  is allergic to paclitaxel.  MEDICATIONS:  Current Outpatient Medications  Medication Sig Dispense Refill  . fluticasone (FLONASE) 50 MCG/ACT nasal spray Place 1 spray into both nostrils 2 (two) times daily. 16 g 2  . lidocaine-prilocaine (EMLA) cream Apply to affected area once 30 g 3  . loratadine (CLARITIN) 10 MG tablet Take 10 mg by mouth daily.    . Multiple Vitamin (MULTIVITAMIN) tablet Take 1 tablet by mouth daily.      No current facility-administered medications for this visit.     PHYSICAL EXAMINATION: ECOG PERFORMANCE STATUS: 1 - Symptomatic but completely ambulatory  Vitals:   05/30/19 1045  BP: 129/80  Pulse: 83  Resp: 18  Temp: 98.5 F (36.9 C)  SpO2: 100%   Filed Weights   05/30/19 1045  Weight: 180 lb 14.4 oz (82.1 kg)    GENERAL: alert, no distress and comfortable SKIN: skin color, texture, turgor are normal, no rashes or significant lesions EYES: normal, Conjunctiva are pink and non-injected, sclera clear OROPHARYNX: no exudate, no erythema and lips, buccal mucosa, and tongue normal  NECK: supple, thyroid normal size, non-tender, without nodularity LYMPH: no palpable lymphadenopathy in the cervical, axillary or inguinal LUNGS: clear to auscultation and percussion with normal breathing effort HEART: regular rate & rhythm and no murmurs and no lower extremity edema ABDOMEN: abdomen soft, non-tender and normal bowel sounds MUSCULOSKELETAL: no cyanosis of digits and no clubbing  NEURO: alert & oriented x 3 with fluent speech, no focal motor/sensory deficits EXTREMITIES: No lower extremity edema  LABORATORY DATA:  I have reviewed the data as listed CMP Latest Ref Rng & Units 05/09/2019 04/18/2019 03/28/2019  Glucose 70 - 99 mg/dL 93 97 91  BUN 8 - 23 mg/dL '20 15 15  '$ Creatinine 0.44 - 1.00 mg/dL 0.76 0.77 0.79  Sodium 135 - 145 mmol/L  140 139 142  Potassium 3.5 - 5.1 mmol/L 4.4 4.6 4.5  Chloride 98 - 111 mmol/L 106 105 108  CO2 22 - 32 mmol/L '23 25 27  '$ Calcium 8.9 - 10.3 mg/dL 9.3 8.9 9.4  Total Protein 6.5 - 8.1 g/dL 7.4 7.3 6.9  Total Bilirubin 0.3 - 1.2 mg/dL 0.5 0.4 0.5  Alkaline Phos 38 - 126 U/L 127(H) 111 107  AST 15 - 41 U/L 28 19 12(L)  ALT 0 - 44 U/L 32 18 12    Lab Results  Component Value Date   WBC 5.8 05/30/2019   HGB 11.5 (L) 05/30/2019   HCT 36.6 05/30/2019   MCV 92.4 05/30/2019   PLT 311 05/30/2019   NEUTROABS 4.0 05/30/2019    ASSESSMENT & PLAN:  Malignant neoplasm of upper-inner quadrant of right breast in female, estrogen receptor positive ([place]) 09/27/19:Two right breast masses 12:00 to 1 o'clock position 1.3 cm and 1.2 cm, they are 1.5 cm apart. 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 Neoadjuvant chemotherapy withTaxol Herceptin weekly x12completed 4/16/2020followed by Herceptin maintenance vsKadcylafor 1 year 2.03/08/2019: Right lumpectomy IDC s/p neoadjuvant treatment, grade 2, 0.7cm,  ER+ 100%, PR+ 5%, HER2 equivocal, Ki67 20%, 0/2 right axillary sentinel lymph nodes negative for carcinoma, with clear margins. 3. Followed by adjuvant radiation therapy 4.Followed by antiestrogen therapy ------------------------------------------------------------------------------------------------------------------------------------------------------------ Treatment plan:Kadcyla maintenance therapy started 03/28/2019 to be completed 10/24/2019 Adjuvant radiation therapy: Started 04/09/2019 Kadcyla toxicities: Denies any side effects from Kadcyla.  Labs reviewed. Denies neuropathy Muscle pains in the arm and leg probably Kadcyla related She is discussing about traveling to [place] for a brief vacation to be  with her sister.  Return to clinic every 3 weeks for Kadcyla and every 6 weeks for follow-up with me.   No orders of the defined types were placed in this encounter.  The patient has a good understanding of the overall plan. she agrees with it. she will call with any problems that may develop before the next visit here.  Nicholas Lose, MD 05/30/2019  Julious Oka Dorshimer am acting as scribe for Dr. Nicholas Lose.  I have reviewed the above documentation for accuracy and completeness, and I agree with the above.

## 2019-05-30 ENCOUNTER — Inpatient Hospital Stay: Payer: BC Managed Care – PPO

## 2019-05-30 ENCOUNTER — Inpatient Hospital Stay: Payer: BC Managed Care – PPO | Attending: Hematology and Oncology

## 2019-05-30 ENCOUNTER — Inpatient Hospital Stay (HOSPITAL_BASED_OUTPATIENT_CLINIC_OR_DEPARTMENT_OTHER): Payer: BC Managed Care – PPO | Admitting: Hematology and Oncology

## 2019-05-30 ENCOUNTER — Other Ambulatory Visit: Payer: Self-pay

## 2019-05-30 DIAGNOSIS — Z170 Estrogen receptor positive status [ER+]: Secondary | ICD-10-CM | POA: Insufficient documentation

## 2019-05-30 DIAGNOSIS — C50211 Malignant neoplasm of upper-inner quadrant of right female breast: Secondary | ICD-10-CM

## 2019-05-30 DIAGNOSIS — Z95828 Presence of other vascular implants and grafts: Secondary | ICD-10-CM

## 2019-05-30 DIAGNOSIS — Z5112 Encounter for antineoplastic immunotherapy: Secondary | ICD-10-CM | POA: Diagnosis not present

## 2019-05-30 LAB — CMP (CANCER CENTER ONLY)
ALT: 40 U/L (ref 0–44)
AST: 37 U/L (ref 15–41)
Albumin: 3.4 g/dL — ABNORMAL LOW (ref 3.5–5.0)
Alkaline Phosphatase: 150 U/L — ABNORMAL HIGH (ref 38–126)
Anion gap: 10 (ref 5–15)
BUN: 13 mg/dL (ref 8–23)
CO2: 25 mmol/L (ref 22–32)
Calcium: 8.8 mg/dL — ABNORMAL LOW (ref 8.9–10.3)
Chloride: 105 mmol/L (ref 98–111)
Creatinine: 0.77 mg/dL (ref 0.44–1.00)
GFR, Est AFR Am: >60 mL/min (ref >60)
GFR, Estimated: >60 mL/min (ref >60)
Glucose, Bld: 125 mg/dL — ABNORMAL HIGH (ref 70–99)
Potassium: 4 mmol/L (ref 3.5–5.1)
Sodium: 140 mmol/L (ref 135–145)
Total Bilirubin: 0.6 mg/dL (ref 0.3–1.2)
Total Protein: 7.3 g/dL (ref 6.5–8.1)

## 2019-05-30 LAB — CBC WITH DIFFERENTIAL (CANCER CENTER ONLY)
Abs Immature Granulocytes: 0.01 10*3/uL (ref 0.00–0.07)
Basophils Absolute: 0 10*3/uL (ref 0.0–0.1)
Basophils Relative: 1 %
Eosinophils Absolute: 0.2 10*3/uL (ref 0.0–0.5)
Eosinophils Relative: 3 %
HCT: 36.6 % (ref 36.0–46.0)
Hemoglobin: 11.5 g/dL — ABNORMAL LOW (ref 12.0–15.0)
Immature Granulocytes: 0 %
Lymphocytes Relative: 19 %
Lymphs Abs: 1.1 10*3/uL (ref 0.7–4.0)
MCH: 29 pg (ref 26.0–34.0)
MCHC: 31.4 g/dL (ref 30.0–36.0)
MCV: 92.4 fL (ref 80.0–100.0)
Monocytes Absolute: 0.5 10*3/uL (ref 0.1–1.0)
Monocytes Relative: 8 %
Neutro Abs: 4 10*3/uL (ref 1.7–7.7)
Neutrophils Relative %: 69 %
Platelet Count: 311 10*3/uL (ref 150–400)
RBC: 3.96 MIL/uL (ref 3.87–5.11)
RDW: 14.9 % (ref 11.5–15.5)
WBC Count: 5.8 10*3/uL (ref 4.0–10.5)
nRBC: 0 % (ref 0.0–0.2)

## 2019-05-30 MED ORDER — SODIUM CHLORIDE 0.9 % IV SOLN
3.6000 mg/kg | Freq: Once | INTRAVENOUS | Status: AC
  Administered 2019-05-30: 300 mg via INTRAVENOUS
  Filled 2019-05-30: qty 15

## 2019-05-30 MED ORDER — SODIUM CHLORIDE 0.9 % IV SOLN
Freq: Once | INTRAVENOUS | Status: AC
  Administered 2019-05-30: 12:00:00 via INTRAVENOUS
  Filled 2019-05-30: qty 250

## 2019-05-30 MED ORDER — SODIUM CHLORIDE 0.9% FLUSH
10.0000 mL | Freq: Once | INTRAVENOUS | Status: AC
  Administered 2019-05-30: 10 mL
  Filled 2019-05-30: qty 10

## 2019-05-30 MED ORDER — DIPHENHYDRAMINE HCL 25 MG PO CAPS
50.0000 mg | ORAL_CAPSULE | Freq: Once | ORAL | Status: AC
  Administered 2019-05-30: 50 mg via ORAL

## 2019-05-30 MED ORDER — HEPARIN SOD (PORK) LOCK FLUSH 100 UNIT/ML IV SOLN
500.0000 [IU] | Freq: Once | INTRAVENOUS | Status: AC | PRN
  Administered 2019-05-30: 500 [IU]
  Filled 2019-05-30: qty 5

## 2019-05-30 MED ORDER — DIPHENHYDRAMINE HCL 25 MG PO CAPS
ORAL_CAPSULE | ORAL | Status: AC
  Filled 2019-05-30: qty 2

## 2019-05-30 MED ORDER — ACETAMINOPHEN 325 MG PO TABS
650.0000 mg | ORAL_TABLET | Freq: Once | ORAL | Status: AC
  Administered 2019-05-30: 650 mg via ORAL

## 2019-05-30 MED ORDER — SODIUM CHLORIDE 0.9% FLUSH
10.0000 mL | INTRAVENOUS | Status: DC | PRN
  Administered 2019-05-30: 10 mL
  Filled 2019-05-30: qty 10

## 2019-05-30 MED ORDER — ACETAMINOPHEN 325 MG PO TABS
ORAL_TABLET | ORAL | Status: AC
  Filled 2019-05-30: qty 2

## 2019-05-30 NOTE — Patient Instructions (Signed)
[facility] Discharge Instructions for Patients Receiving Chemotherapy  Today you received the following chemotherapy agents:  Kadcyla   To help prevent nausea and vomiting after your treatment, we encourage you to take your nausea medication as prescribed.   If you develop nausea and vomiting that is not controlled by your nausea medication, call the clinic.   BELOW ARE SYMPTOMS THAT SHOULD BE REPORTED IMMEDIATELY:  *FEVER GREATER THAN 100.5 F  *CHILLS WITH OR WITHOUT FEVER  NAUSEA AND VOMITING THAT IS NOT CONTROLLED WITH YOUR NAUSEA MEDICATION  *UNUSUAL SHORTNESS OF BREATH  *UNUSUAL BRUISING OR BLEEDING  TENDERNESS IN MOUTH AND THROAT WITH OR WITHOUT PRESENCE OF ULCERS  *URINARY PROBLEMS  *BOWEL PROBLEMS  UNUSUAL RASH Items with * indicate a potential emergency and should be followed up as soon as possible.  Feel free to call the clinic should you have any questions or concerns. The clinic phone number is [phone].  Please show the CHEMO ALERT CARD at check-in to the Emergency Department and triage nurse.  Coronavirus (COVID-19) Are you at risk?  Are you at risk for the Coronavirus (COVID-19)?  To be considered HIGH RISK for Coronavirus (COVID-19), you have to meet the following criteria:  . Traveled to [place], [place], [place], [place] or [place]; or in the [place] to [place], [place], [place], or [place]; and have fever, cough, and shortness of breath within the last 2 weeks of travel OR . Been in close contact with a person diagnosed with COVID-19 within the last 2 weeks and have fever, cough, and shortness of breath . IF YOU DO NOT MEET THESE CRITERIA, YOU ARE CONSIDERED LOW RISK FOR COVID-19.  What to do if you are HIGH RISK for COVID-19?  . If you are having a medical emergency, call 911. . Seek medical care right away. Before you go to a doctor's office, urgent care or emergency department, call ahead and tell them about your  recent travel, contact with someone diagnosed with COVID-19, and your symptoms. You should receive instructions from your physician's office regarding next steps of care.  . When you arrive at healthcare provider, tell the healthcare staff immediately you have returned from visiting [place], [place], [place], [place] or [place]; or traveled in the [place] to [place], [place], [place], or [place]; in the last two weeks or you have been in close contact with a person diagnosed with COVID-19 in the last 2 weeks.   . Tell the health care staff about your symptoms: fever, cough and shortness of breath. . After you have been seen by a medical provider, you will be either: o Tested for (COVID-19) and discharged home on quarantine except to seek medical care if symptoms worsen, and asked to  - Stay home and avoid contact with others until you get your results (4-5 days)  - Avoid travel on public transportation if possible (such as bus, train, or airplane) or o Sent to the Emergency Department by EMS for evaluation, COVID-19 testing, and possible admission depending on your condition and test results.  What to do if you are LOW RISK for COVID-19?  Reduce your risk of any infection by using the same precautions used for avoiding the common cold or flu:  . Wash your hands often with soap and warm water for at least 20 seconds.  If soap and water are not readily available, use an alcohol-based hand sanitizer with at least 60% alcohol.  . If coughing   or sneezing, cover your mouth and nose by coughing or sneezing into the elbow areas of your shirt or coat, into a tissue or into your sleeve (not your hands). . Avoid shaking hands with others and consider head nods or verbal greetings only. . Avoid touching your eyes, nose, or mouth with unwashed hands.  . Avoid close contact with people who are sick. . Avoid places or events with large numbers of people in one location, like concerts or sporting  events. . Carefully consider travel plans you have or are making. . If you are planning any travel outside or inside the [place], visit the CDC's Travelers' Health webpage for the latest health notices. . If you have some symptoms but not all symptoms, continue to monitor at home and seek medical attention if your symptoms worsen. . If you are having a medical emergency, call 911.   ADDITIONAL HEALTHCARE OPTIONS FOR PATIENTS  [facility] / e-Visit: [email or web address]         [facility]: [phone]  [facility]: [phone]                   [facility]: [phone]   

## 2019-05-31 ENCOUNTER — Telehealth: Payer: Self-pay | Admitting: Hematology and Oncology

## 2019-05-31 NOTE — Telephone Encounter (Signed)
I talk with patient regarding schedule  

## 2019-06-03 ENCOUNTER — Encounter: Payer: Self-pay | Admitting: *Deleted

## 2019-06-03 ENCOUNTER — Telehealth: Payer: Self-pay | Admitting: Hematology and Oncology

## 2019-06-03 NOTE — Telephone Encounter (Signed)
R/s appt per 8/24 sch message - called pt - unable to reach . Left message with new appt date and time

## 2019-06-04 ENCOUNTER — Other Ambulatory Visit: Payer: Self-pay

## 2019-06-04 ENCOUNTER — Ambulatory Visit: Payer: BC Managed Care – PPO | Attending: General Surgery | Admitting: Physical Therapy

## 2019-06-04 DIAGNOSIS — L599 Disorder of the skin and subcutaneous tissue related to radiation, unspecified: Secondary | ICD-10-CM | POA: Diagnosis not present

## 2019-06-04 DIAGNOSIS — Z483 Aftercare following surgery for neoplasm: Secondary | ICD-10-CM | POA: Insufficient documentation

## 2019-06-04 DIAGNOSIS — R293 Abnormal posture: Secondary | ICD-10-CM | POA: Diagnosis not present

## 2019-06-04 NOTE — Therapy (Signed)
[place], [place], [address] Phone: [phone]   Fax:  [phone]  Physical Therapy Re-Evaluation  Patient Details  Name: Judith Thomas MRN: [id number] Date of Birth: Aug 06, 1956 Referring Provider (PT): Dr. [name]    Encounter Date: 06/04/2019  PT End of Session - 06/04/19 1054    Visit Number  2    Number of Visits  10    Date for PT Re-Evaluation  07/10/19    PT Start Time  1000    PT Stop Time  1045    PT Time Calculation (min)  45 min    Activity Tolerance  Patient tolerated treatment well       Past Medical History:  Diagnosis Date  . Breast cancer ([place])    right  . Family history of bladder cancer   . Family history of breast cancer   . Knee pain, chronic 10/11/2015    Past Surgical History:  Procedure Laterality Date  . BREAST LUMPECTOMY WITH RADIOACTIVE SEED AND SENTINEL LYMPH NODE BIOPSY Right 03/08/2019   Procedure: RIGHT BREAST RADIOACTIVE SEED LUMPECTOMY X2 AND SENTINEL LYMPH NODE BIOPSY;  Surgeon: Jovita Kussmaul, MD;  Location: [facility];  Service: General;  Laterality: Right;  . COLONOSCOPY  03/2010  . none    . PORTACATH PLACEMENT Left 11/07/2018   Procedure: INSERTION PORT-A-CATH WITH ULTRASOUND;  Surgeon: Jovita Kussmaul, MD;  Location: [facility];  Service: General;  Laterality: Left;    There were no vitals filed for this visit.   Subjective Assessment - 06/04/19 1009    Subjective  Pt is keeping her grandkids a couple of days a week  she is feeling well overall.  she tries to walk at least a mile every day.  She completed radiation without dificuty She has a little firmness around the scar and is tender .  She is using radiaplex  she has a compression bra but does not feel that she needs it. she has "pulling" down into the right forearm that seems to happen more right after a Katcyla treament that seems to be more when whe is reaching with  her arms.  She does not have a compression sleeve.    Pertinent History  right breast cancer diagnosed on De. 19,  Pt had chemotherapy and had an allergic reaction to taxol and then had lumpectomy with 2 nodes removed May 29. 2020    Currently in Pain?  Yes    Pain Score  2     Pain Location  Arm    Pain Orientation  Right    Pain Descriptors / Indicators  Other (Comment)   pulling   Pain Type  Chronic pain    Pain Radiating Towards  no    Pain Onset  More than a month ago    Pain Frequency  Intermittent    Aggravating Factors   reaching    Pain Relieving Factors  uses left arm    Effect of Pain on Daily Activities  no         [facility] PT Assessment - 06/04/19 0001      Assessment   Medical Diagnosis  right breast cancer    Referring Provider (PT)  Dr. [name]     Onset Date/Surgical Date  09/27/18    Hand Dominance  Right      Observation/Other Assessments   Observations  Pt has darkening of skin over left breast and axilla. well. healed  scar.  Visible fullness over left chest and in left axilla port in right chest  guitar string cord visible in medical let upper forerm in diagonal orientation.      Skin Integrity  no open areas       Sensation   Light Touch  Appears Intact      AROM   Right Shoulder Flexion  155 Degrees    Right Shoulder ABduction  168 Degrees      Strength   Overall Strength  Deficits    Overall Strength Comments  appears to have general deconditioning       Palpation   Palpation comment  firm fullness at right axilla near incision  and at right breast pt reports it is much better but is still intact and tender with deep palpation  guitar string cord in forearm,no cords felt in axilla.         LYMPHEDEMA/ONCOLOGY QUESTIONNAIRE - 06/04/19 1020      Right Upper Extremity Lymphedema   10 cm Proximal to Olecranon Process  31 cm    Olecranon Process  25 cm    10 cm Proximal to Ulnar Styloid Process  22 cm    Just Proximal to Ulnar Styloid Process  16 cm     Across Hand at [employer]  19 cm    At [place] of 2nd Digit  5.9 cm      Left Upper Extremity Lymphedema   10 cm Proximal to Olecranon Process  31 cm    Olecranon Process  25 cm    10 cm Proximal to Ulnar Styloid Process  21.5 cm    Just Proximal to Ulnar Styloid Process  15.6 cm    Across Hand at [employer]  18 cm    At [place] of 2nd Digit  5.8 cm             Objective measurements completed on examination: See above findings.      [facility] Adult PT Treatment/Exercise - 06/04/19 0001      Manual Therapy   Manual Therapy  Edema management;Manual Lymphatic Drainage (MLD)    Edema Management  provided chip pack for pt to wear inside compression bra for at least 2 hours to try to see if it makes a difference in axillary full area.  Medium tg soft doubled over at left elbow to foream to upper arm to help support lymphatic system     Manual Lymphatic Drainage (MLD)  brief description of lymph system, short neck , diaphragmatic breaths, right inguinal nodes and right axilla, upper, elbow, lower arm with return along pathways. pt insturcted with hand over hand technique and insructed to slow down rate                   PT Long Term Goals - 06/04/19 1059      PT LONG TERM GOAL #1   Title  pt wil report the swelling in her right axilla is decreased by 50%    Time  4    Period  Weeks    Status  New      PT LONG TERM GOAL #2   Title  Pt will be independent in a home program for shoulder range of motion and strength    Time  4    Period  Weeks    Status  New      PT LONG TERM GOAL #3   Title  Pt wil verbalize understanding of lymphedema  risk reduction precautions    Time  4    Period  Weeks    Status  New      PT LONG TERM GOAL #4   Title  Pt will be independent in self MLD and use of compression to help manage fullness at home    Time  4    Period  Weeks    Status  New      PT LONG TERM GOAL #5   Title  Pt will no longer have visible or palpable cord in  forearm    Time  4    Period  Weeks    Status  New             Plan - 06/04/19 1055    Clinical Impression Statement  Pt comes for reassessment after completion of radiation.  She has developed what looks like an axillary cord with pain in  forearm and fullness in right axilla that is better, but still present.  Mild increases in right arm circumferences likely due to the healing from radiation.  She would benefit from instruction in MLD with compression to arm and axilla and eventual instruction in Strength ABC program for ongoing health and risk reduction of lymphedema Script for sleeve and gauntlet sent to Dr. [name]    Personal Factors and Comorbidities  Comorbidity 3+;Comorbidity 2    Comorbidities  surgery, previous chemo , ongoing radiation    Rehab Potential  Good    PT Treatment/Interventions  ADLs/Self Care Home Management;DME Instruction;Therapeutic activities;Therapeutic exercise;Patient/family education;Orthotic Fit/Training;Manual techniques;Manual lymph drainage;Compression bandaging;Scar mobilization;Passive range of motion    PT Next Visit Plan  teach and perform MLD including posterior interaxilary anastamosis, assess effect of tg soft and chip pack check to see if script back form sleeve, eventually  teach strength ABC program    PT Home Exercise Plan  supine and standing dowel exercises    Consulted and Agree with Plan of Care  Patient       Patient will benefit from skilled therapeutic intervention in order to improve the following deficits and impairments:  Decreased skin integrity, Increased edema, Decreased scar mobility, Decreased range of motion, Decreased knowledge of precautions, Decreased strength, Impaired UE functional use, Postural dysfunction  Visit Diagnosis: Aftercare following surgery for neoplasm - Plan: PT plan of care cert/re-cert  Abnormal posture - Plan: PT plan of care cert/re-cert  Disorder of the skin and subcutaneous tissue related to  radiation, unspecified - Plan: PT plan of care cert/re-cert     Problem List Patient Active Problem List   Diagnosis Date Noted  . Genetic testing 01/09/2019  . Family history of breast cancer   . Family history of bladder cancer   . Port-A-Cath in place 11/15/2018  . Malignant neoplasm of upper-inner quadrant of right breast in female, estrogen receptor positive ([place]) 10/24/2018  . BMI 28.0-28.9,adult 07/28/2015   Donato Heinz. Owens Shark PT  Norwood Levo 06/04/2019, 6:04 PM  [place], [place], [address] Phone: [phone]   Fax:  [phone]  Name: Blakeli Sampley MRN: [id number] Date of Birth: Feb 17, 1956

## 2019-06-06 ENCOUNTER — Encounter: Payer: Self-pay | Admitting: Hematology and Oncology

## 2019-06-10 NOTE — Progress Notes (Signed)
I called the patient today about her upcoming follow-up appointment in radiation oncology.   Given concerns about the COVID-19 pandemic, I offered a phone assessment with the patient to determine if coming to the clinic was necessary. She accepted.  I let the patient know that I had spoken with Dr. Isidore Moos, and she wanted them to know the importance of washing their hands for at least 20 seconds at a time, especially after going out in public, and before they eat.  Limit going out in public whenever possible. Do not touch your face, unless your hands are clean, such as when bathing. Get plenty of rest, eat well, and stay hydrated.   The patient denies any symptomatic concerns.  Specifically, they report good healing of their skin in the radiation fields.  Skin is intact.    I recommended that she continue skin care by applying oil or lotion with vitamin E to the skin in the radiation fields, BID, for 2 more months.  Continue follow-up with medical oncology - follow-up is scheduled on 07/11/19 with Dr. Lindi Adie.  I explained that yearly mammograms are important for patients with intact breast tissue, and physical exams are important after mastectomy for patients that cannot undergo mammography.  I encouraged her to call if she had further questions or concerns about her healing. Otherwise, she will follow-up PRN in radiation oncology. [name] pleased with this plan, and we will cancel her upcoming follow-up to reduce the risk of COVID-19 transmission.

## 2019-06-11 ENCOUNTER — Other Ambulatory Visit: Payer: Self-pay

## 2019-06-11 ENCOUNTER — Ambulatory Visit
Admission: RE | Admit: 2019-06-11 | Discharge: 2019-06-11 | Disposition: A | Discharge status: Home / Self Care | Payer: BC Managed Care – PPO | Source: Ambulatory Visit | Attending: Radiation Oncology | Admitting: Radiation Oncology

## 2019-06-11 ENCOUNTER — Ambulatory Visit: Payer: BC Managed Care – PPO | Attending: General Surgery | Admitting: Physical Therapy

## 2019-06-11 DIAGNOSIS — R293 Abnormal posture: Secondary | ICD-10-CM | POA: Diagnosis not present

## 2019-06-11 DIAGNOSIS — Z483 Aftercare following surgery for neoplasm: Secondary | ICD-10-CM | POA: Diagnosis not present

## 2019-06-11 DIAGNOSIS — L599 Disorder of the skin and subcutaneous tissue related to radiation, unspecified: Secondary | ICD-10-CM | POA: Insufficient documentation

## 2019-06-11 NOTE — Patient Instructions (Signed)
[email or web address] Self care videos Self MLD Right upper extremily

## 2019-06-11 NOTE — Therapy (Signed)
[place], [place], [address] Phone: [phone]   Fax:  [phone]  Physical Therapy Treatment  Patient Details  Name: Judith Thomas MRN: [id number] Date of Birth: [birth year] Referring Provider (PT): Dr. [name]    Encounter Date: 06/11/2019  PT End of Session - 06/11/19 1329    Visit Number  3    Number of Visits  10    Date for PT Re-Evaluation  07/10/19    PT Start Time  [id number]    PT Stop Time  1315    PT Time Calculation (min)  40 min    Activity Tolerance  Patient tolerated treatment well    Behavior During Therapy  [facility] for tasks assessed/performed       Past Medical History:  Diagnosis Date  . Breast cancer ([place])    right  . Family history of bladder cancer   . Family history of breast cancer   . Knee pain, chronic 10/11/2015    Past Surgical History:  Procedure Laterality Date  . BREAST LUMPECTOMY WITH RADIOACTIVE SEED AND SENTINEL LYMPH NODE BIOPSY Right 03/08/2019   Procedure: RIGHT BREAST RADIOACTIVE SEED LUMPECTOMY X2 AND SENTINEL LYMPH NODE BIOPSY;  Surgeon: Jovita Kussmaul, MD;  Location: [facility];  Service: General;  Laterality: Right;  . COLONOSCOPY  03/2010  . none    . PORTACATH PLACEMENT Left 11/07/2018   Procedure: INSERTION PORT-A-CATH WITH ULTRASOUND;  Surgeon: Jovita Kussmaul, MD;  Location: [facility];  Service: General;  Laterality: Left;    There were no vitals filed for this visit.  Subjective Assessment - 06/11/19 1319    Subjective  Pt states she tried the compression bra and chip pack, but she had more pain and fullness in her axilla so she did not continue to use it.  She has used the tg soft on her arm about once a day and thinks that it has helped her.    Pertinent History  right breast cancer diagnosed on De. 19,  Pt had chemotherapy and had an allergic reaction to taxol and then had lumpectomy with 2 nodes removed May 29. 2020 Pt  then had radiation completed 05/06/2019 and is taking Kadcycla    Patient Stated Goals  to get rid of the fullness and pulling in her arm    Currently in Pain?  No/denies                       [facility] Adult PT Treatment/Exercise - 06/11/19 0001      Exercises   Exercises  Shoulder      Shoulder Exercises: Supine   Other Supine Exercises  with elbows bent and shoulders in 90 abduction, pt brings both arms to front of chest and return     Other Supine Exercises  with goal post arms, lower trunk rotation       Shoulder Exercises: Sidelying   External Rotation  AROM;Right;10 reps    ABduction  AROM;Right;10 reps    Other Sidelying Exercises  neural stretch with folding and unfolding arm out to the side with wrist and finger extension,       Manual Therapy   Manual Therapy  Edema management;Manual Lymphatic Drainage (MLD)    Edema Management  instructed with hand over hand istruction and mirror for visual cues for left breast and axilla     Manual Lymphatic Drainage (MLD)  instructed in and performed self manual lymph  draiange to right arm and lateral chest and axilla . In supine, short neck, superficial and deep abdominals, right inguinal nodes, right axillo-inguinal anastamosis, left axillary nodes, anterior interaxillary anastamosis, left arm to hand and return along pathways,  Then to sidelying for posterior interaxillary anastamosis                   PT Long Term Goals - 06/04/19 1059      PT LONG TERM GOAL #1   Title  pt wil report the swelling in her right axilla is decreased by 50%    Time  4    Period  Weeks    Status  New      PT LONG TERM GOAL #2   Title  Pt will be independent in a home program for shoulder range of motion and strength    Time  4    Period  Weeks    Status  New      PT LONG TERM GOAL #3   Title  Pt wil verbalize understanding of lymphedema risk reduction precautions    Time  4    Period  Weeks    Status  New      PT LONG  TERM GOAL #4   Title  Pt will be independent in self MLD and use of compression to help manage fullness at home    Time  4    Period  Weeks    Status  New      PT LONG TERM GOAL #5   Title  Pt will no longer have visible or palpable cord in forearm    Time  4    Period  Weeks    Status  New            Plan - 06/11/19 1330    Clinical Impression Statement  Pt has had resolution of axillary cording at elbow with tg soft but did not tolerate compression patch at axilla. She did well with self MLD instruction today and was given web link to do it at home.  she will focus on MLD at axilla    Comorbidities  surgery, previous chemo , ongoing radiation    PT Treatment/Interventions  ADLs/Self Care Home Management;DME Instruction;Therapeutic activities;Therapeutic exercise;Patient/family education;Orthotic Fit/Training;Manual techniques;Manual lymph drainage;Compression bandaging;Scar mobilization;Passive range of motion    PT Next Visit Plan  review  MLD including posterior interaxilary anastamosis, check to see if script back form sleeve, continue with work at axillary area  eventually  teach strength ABC program    Consulted and Agree with Plan of Care  Patient       Patient will benefit from skilled therapeutic intervention in order to improve the following deficits and impairments:  Decreased skin integrity, Increased edema, Decreased scar mobility, Decreased range of motion, Decreased knowledge of precautions, Decreased strength, Impaired UE functional use, Postural dysfunction  Visit Diagnosis: Aftercare following surgery for neoplasm  Abnormal posture  Disorder of the skin and subcutaneous tissue related to radiation, unspecified     Problem List Patient Active Problem List   Diagnosis Date Noted  . Genetic testing 01/09/2019  . Family history of breast cancer   . Family history of bladder cancer   . Port-A-Cath in place 11/15/2018  . Malignant neoplasm of upper-inner  quadrant of right breast in female, estrogen receptor positive ([place]) 10/24/2018  . BMI 28.0-28.9,adult 07/28/2015   Donato Heinz. Owens Shark, PT  Norwood Levo 06/11/2019, 1:33 PM  [facility]  [place], [place], [address] Phone: [phone]   Fax:  [phone]  Name: Dawnisha Denino MRN: [id number] Date of Birth: July 13, 1956

## 2019-06-13 ENCOUNTER — Encounter: Payer: Self-pay | Admitting: Physical Therapy

## 2019-06-13 ENCOUNTER — Other Ambulatory Visit: Payer: Self-pay

## 2019-06-13 ENCOUNTER — Ambulatory Visit (INDEPENDENT_AMBULATORY_CARE_PROVIDER_SITE_OTHER): Payer: BC Managed Care – PPO | Admitting: *Deleted

## 2019-06-13 ENCOUNTER — Ambulatory Visit: Payer: BC Managed Care – PPO | Admitting: Physical Therapy

## 2019-06-13 DIAGNOSIS — Z483 Aftercare following surgery for neoplasm: Secondary | ICD-10-CM

## 2019-06-13 DIAGNOSIS — Z23 Encounter for immunization: Secondary | ICD-10-CM | POA: Diagnosis not present

## 2019-06-13 DIAGNOSIS — R293 Abnormal posture: Secondary | ICD-10-CM

## 2019-06-13 DIAGNOSIS — L599 Disorder of the skin and subcutaneous tissue related to radiation, unspecified: Secondary | ICD-10-CM | POA: Diagnosis not present

## 2019-06-13 NOTE — Therapy (Signed)
[place], [place], [address] Phone: [phone]   Fax:  [phone]  Physical Therapy Treatment  Patient Details  Name: Judith Thomas MRN: [id number] Date of Birth: 07/25/1956 Referring Provider (PT): Dr. [name]    Encounter Date: 06/13/2019  PT End of Session - 06/13/19 1424    Visit Number  4    Number of Visits  10    Date for PT Re-Evaluation  07/10/19    PT Start Time  1330    PT Stop Time  1415    PT Time Calculation (min)  45 min    Activity Tolerance  Patient tolerated treatment well       Past Medical History:  Diagnosis Date  . Breast cancer ([place])    right  . Family history of bladder cancer   . Family history of breast cancer   . Knee pain, chronic 10/11/2015    Past Surgical History:  Procedure Laterality Date  . BREAST LUMPECTOMY WITH RADIOACTIVE SEED AND SENTINEL LYMPH NODE BIOPSY Right 03/08/2019   Procedure: RIGHT BREAST RADIOACTIVE SEED LUMPECTOMY X2 AND SENTINEL LYMPH NODE BIOPSY;  Surgeon: Jovita Kussmaul, MD;  Location: [facility];  Service: General;  Laterality: Right;  . COLONOSCOPY  03/2010  . none    . PORTACATH PLACEMENT Left 11/07/2018   Procedure: INSERTION PORT-A-CATH WITH ULTRASOUND;  Surgeon: Jovita Kussmaul, MD;  Location: [facility];  Service: General;  Laterality: Left;    There were no vitals filed for this visit.  Subjective Assessment - 06/13/19 1420    Subjective  Pt reports she has been doing her MLD and shoulder exercises at home.  She feels like the fullness in her right axilla is decreasing    Pertinent History  right breast cancer diagnosed on De. 19,  Pt had chemotherapy and had an allergic reaction to taxol and then had lumpectomy with 2 nodes removed May 29. 2020 Pt then had radiation completed 05/06/2019 and is taking Kadcycla    Patient Stated Goals  to get rid of the fullness and pulling in her arm    Currently in  Pain?  No/denies                       [facility] Adult PT Treatment/Exercise - 06/13/19 0001      Exercises   Exercises  Other Exercises    Other Exercises   instructed in basics of Strenth ABC program and taught stretes up to lower trunk rotation Pt with good understanding and perrformance       Manual Therapy   Manual Therapy  Edema management;Manual Lymphatic Drainage (MLD)    Edema Management  pt demonstrates self MLD to short neck, abdomen, right groin and axillo-inguinal anastamosis and right axilla     Manual Lymphatic Drainage (MLD)  in right sidelying for posterior interaxillary anastamosis              PT Education - 06/13/19 1424    Education Details  self MLD    Person(s) Educated  Patient    Methods  Demonstration;Explanation;Handout    Comprehension  Returned demonstration          PT Long Term Goals - 06/13/19 1426      PT LONG TERM GOAL #1   Title  pt wil report the swelling in her right axilla is decreased by 50%    Period  Weeks    Status  On-going      PT LONG TERM GOAL #2   Title  Pt will be independent in a home program for shoulder range of motion and strength    Period  Weeks    Status  On-going      PT LONG TERM GOAL #3   Title  Pt wil verbalize understanding of lymphedema risk reduction precautions    Status  Achieved      PT LONG TERM GOAL #4   Title  Pt will be independent in self MLD and use of compression to help manage fullness at home    Status  Achieved      PT LONG TERM GOAL #5   Title  Pt will no longer have visible or palpable cord in forearm    Status  Achieved            Plan - 06/13/19 1424    Clinical Impression Statement  Pt is making good progress and is able to do self MLD.  She still has fullness in right axilla but will use MLD and intermittent compression with folded towel and chip pack as needed. Began Strength ABC program today and pt feels that she will be able to follow through at home gave  pt script for sleeve and information about where to get it.    Comorbidities  surgery, previous chemo , ongoing radiation    Examination-Activity Limitations  Reach Overhead    PT Treatment/Interventions  ADLs/Self Care Home Management;DME Instruction;Therapeutic activities;Therapeutic exercise;Patient/family education;Orthotic Fit/Training;Manual techniques;Manual lymph drainage;Compression bandaging;Scar mobilization;Passive range of motion    PT Next Visit Plan  review  MLD including posterior interaxilary anastamosis, check to see if script back form sleeve, continue with work at axillary area  eventually  teach strength ABC program    Consulted and Agree with Plan of Care  Patient       Patient will benefit from skilled therapeutic intervention in order to improve the following deficits and impairments:  Decreased skin integrity, Increased edema, Decreased scar mobility, Decreased range of motion, Decreased knowledge of precautions, Decreased strength, Impaired UE functional use, Postural dysfunction  Visit Diagnosis: Aftercare following surgery for neoplasm  Abnormal posture  Disorder of the skin and subcutaneous tissue related to radiation, unspecified     Problem List Patient Active Problem List   Diagnosis Date Noted  . Genetic testing 01/09/2019  . Family history of breast cancer   . Family history of bladder cancer   . Port-A-Cath in place 11/15/2018  . Malignant neoplasm of upper-inner quadrant of right breast in female, estrogen receptor positive ([place]) 10/24/2018  . BMI 28.0-28.9,adult 07/28/2015   Donato Heinz. Owens Shark PT  Norwood Levo 06/13/2019, 2:27 PM  [place], [place], [address] Phone: [phone]   Fax:  [phone]  Name: Judith Thomas MRN: [id number] Date of Birth: 07-27-1956

## 2019-06-13 NOTE — Patient Instructions (Signed)
First of all, check with your insurance company to see if provider is in network    [facility] (for wigs and compression sleeves / gloves/gauntlets )  [address] [place], [place] [address] [phone]  Will file some insurances --- call for appointment   [facility] (for mastectomy prosthetics and garments) [address] [place], [place] [address] [phone] Will file some insurances --- call for appointment  [facility]  [address]  [place], [place] [address] [phone] Lower extremity garments  [facility] [address] [place], [place]  [address] [phone]  Cathy Rubel ( Medicaid certified lymphedema fitter) [phone] [email or web address]  Melissa Meares  [facility]  [phone]  [facility] [address] [place], [place] [address] [phone]  Other Resources: National Lymphedema Network:  www.lymphnet.org www.Klosetraining.com for patient articles and self manual lymph drainage information www.lymphedemablog.com has informative articles.  www.compressionguru.com www.lymphedemaproducts.com www.brightlifedirect.com www.compressionguru.com 

## 2019-06-14 DIAGNOSIS — C50211 Malignant neoplasm of upper-inner quadrant of right female breast: Secondary | ICD-10-CM | POA: Diagnosis not present

## 2019-06-14 DIAGNOSIS — Z170 Estrogen receptor positive status [ER+]: Secondary | ICD-10-CM | POA: Diagnosis not present

## 2019-06-19 ENCOUNTER — Ambulatory Visit: Payer: BC Managed Care – PPO | Admitting: Physical Therapy

## 2019-06-19 ENCOUNTER — Other Ambulatory Visit: Payer: Self-pay

## 2019-06-19 ENCOUNTER — Encounter: Payer: Self-pay | Admitting: Physical Therapy

## 2019-06-19 DIAGNOSIS — L599 Disorder of the skin and subcutaneous tissue related to radiation, unspecified: Secondary | ICD-10-CM

## 2019-06-19 DIAGNOSIS — R293 Abnormal posture: Secondary | ICD-10-CM

## 2019-06-19 DIAGNOSIS — Z483 Aftercare following surgery for neoplasm: Secondary | ICD-10-CM

## 2019-06-19 NOTE — Therapy (Signed)
[place], [place], [address] Phone: [phone]   Fax:  [phone]  Physical Therapy Treatment  Patient Details  Name: Judith Thomas MRN: [id number] Date of Birth: Feb 26, 1956 Referring Provider (PT): Dr. [name]    Encounter Date: 06/19/2019  PT End of Session - 06/19/19 1517    Visit Number  4    Number of Visits  10    Date for PT Re-Evaluation  07/10/19    PT Start Time  1430    PT Stop Time  1510    PT Time Calculation (min)  40 min    Activity Tolerance  Patient tolerated treatment well    Behavior During Therapy  [facility] for tasks assessed/performed       Past Medical History:  Diagnosis Date  . Breast cancer ([place])    right  . Family history of bladder cancer   . Family history of breast cancer   . Knee pain, chronic 10/11/2015    Past Surgical History:  Procedure Laterality Date  . BREAST LUMPECTOMY WITH RADIOACTIVE SEED AND SENTINEL LYMPH NODE BIOPSY Right 03/08/2019   Procedure: RIGHT BREAST RADIOACTIVE SEED LUMPECTOMY X2 AND SENTINEL LYMPH NODE BIOPSY;  Surgeon: Jovita Kussmaul, MD;  Location: [facility];  Service: General;  Laterality: Right;  . COLONOSCOPY  03/2010  . none    . PORTACATH PLACEMENT Left 11/07/2018   Procedure: INSERTION PORT-A-CATH WITH ULTRASOUND;  Surgeon: Jovita Kussmaul, MD;  Location: [facility];  Service: General;  Laterality: Left;    There were no vitals filed for this visit.  Subjective Assessment - 06/19/19 1435    Subjective  Pt said she had a visit with Dr. [name] .  all is good. Got compression sleeve and gauntlet ordered    Pertinent History  right breast cancer diagnosed on De. 19,  Pt had chemotherapy and had an allergic reaction to taxol and then had lumpectomy with 2 nodes removed May 29. 2020 Pt then had radiation completed 05/06/2019 and is taking Kadcycla    Currently in Pain?  No/denies                        [facility] Adult PT Treatment/Exercise - 06/19/19 0001      Exercises   Exercises  Other Exercises    Other Exercises   completed instruction of all Strength ABC exercises with 1 pound weight of at least 10 reps and pt did well with all excep for romanian dead lift.  She neede repeated verbal and tactile cues for correct hip hinge. also showed variation for doing the superwoman in standing with chair armrests. Pt had good form with all other exercises              PT Education - 06/19/19 1519    Education Details  Strength ABC program    Person(s) Educated  Patient    Methods  Explanation;Demonstration;Handout    Comprehension  Returned demonstration          PT Long Term Goals - 06/13/19 1426      PT LONG TERM GOAL #1   Title  pt wil report the swelling in her right axilla is decreased by 50%    Period  Weeks    Status  On-going      PT LONG TERM GOAL #2   Title  Pt will be independent in a home program for shoulder range of motion  and strength    Period  Weeks    Status  On-going      PT LONG TERM GOAL #3   Title  Pt wil verbalize understanding of lymphedema risk reduction precautions    Status  Achieved      PT LONG TERM GOAL #4   Title  Pt will be independent in self MLD and use of compression to help manage fullness at home    Status  Achieved      PT LONG TERM GOAL #5   Title  Pt will no longer have visible or palpable cord in forearm    Status  Achieved            Plan - 06/19/19 1517    Clinical Impression Statement  Pt is able to do Strength ABC program and verbalized good understanding of how to progress.  She continues to have fullness at right pec major area but thinks it is getting better.    Comorbidities  surgery, previous chemo , ongoing radiation    PT Treatment/Interventions  ADLs/Self Care Home Management;DME Instruction;Therapeutic activities;Therapeutic exercise;Patient/family education;Orthotic  Fit/Training;Manual techniques;Manual lymph drainage;Compression bandaging;Scar mobilization;Passive range of motion    PT Next Visit Plan  review Strength ABC as needed.  Assess for goals and fullness at left pec area. Determine if ready for discharge    Consulted and Agree with Plan of Care  Patient       Patient will benefit from skilled therapeutic intervention in order to improve the following deficits and impairments:  Decreased skin integrity, Increased edema, Decreased scar mobility, Decreased range of motion, Decreased knowledge of precautions, Decreased strength, Impaired UE functional use, Postural dysfunction  Visit Diagnosis: Aftercare following surgery for neoplasm  Abnormal posture  Disorder of the skin and subcutaneous tissue related to radiation, unspecified     Problem List Patient Active Problem List   Diagnosis Date Noted  . Genetic testing 01/09/2019  . Family history of breast cancer   . Family history of bladder cancer   . Port-A-Cath in place 11/15/2018  . Malignant neoplasm of upper-inner quadrant of right breast in female, estrogen receptor positive ([place]) 10/24/2018  . BMI 28.0-28.9,adult 07/28/2015   Donato Heinz. Owens Shark PT  Norwood Levo 06/19/2019, 3:20 PM  [place], [place], [address] Phone: [phone]   Fax:  [phone]  Name: Judith Thomas MRN: [id number] Date of Birth: 08-11-1956

## 2019-06-21 ENCOUNTER — Other Ambulatory Visit: Payer: Self-pay

## 2019-06-21 ENCOUNTER — Inpatient Hospital Stay: Payer: BC Managed Care – PPO

## 2019-06-21 ENCOUNTER — Inpatient Hospital Stay: Payer: BC Managed Care – PPO | Attending: Hematology and Oncology

## 2019-06-21 VITALS — BP 126/73 | HR 75 | Temp 98.4°F | Resp 16 | Wt 181.8 lb

## 2019-06-21 DIAGNOSIS — C50211 Malignant neoplasm of upper-inner quadrant of right female breast: Secondary | ICD-10-CM

## 2019-06-21 DIAGNOSIS — Z5112 Encounter for antineoplastic immunotherapy: Secondary | ICD-10-CM | POA: Diagnosis not present

## 2019-06-21 DIAGNOSIS — Z95828 Presence of other vascular implants and grafts: Secondary | ICD-10-CM

## 2019-06-21 DIAGNOSIS — Z170 Estrogen receptor positive status [ER+]: Secondary | ICD-10-CM

## 2019-06-21 LAB — CBC WITH DIFFERENTIAL (CANCER CENTER ONLY)
Abs Immature Granulocytes: 0.01 10*3/uL (ref 0.00–0.07)
Basophils Absolute: 0.1 10*3/uL (ref 0.0–0.1)
Basophils Relative: 1 %
Eosinophils Absolute: 0.1 10*3/uL (ref 0.0–0.5)
Eosinophils Relative: 2 %
HCT: 38.1 % (ref 36.0–46.0)
Hemoglobin: 12.1 g/dL (ref 12.0–15.0)
Immature Granulocytes: 0 %
Lymphocytes Relative: 18 %
Lymphs Abs: 1 10*3/uL (ref 0.7–4.0)
MCH: 29.2 pg (ref 26.0–34.0)
MCHC: 31.8 g/dL (ref 30.0–36.0)
MCV: 92 fL (ref 80.0–100.0)
Monocytes Absolute: 0.4 10*3/uL (ref 0.1–1.0)
Monocytes Relative: 7 %
Neutro Abs: 4.1 10*3/uL (ref 1.7–7.7)
Neutrophils Relative %: 72 %
Platelet Count: 306 10*3/uL (ref 150–400)
RBC: 4.14 MIL/uL (ref 3.87–5.11)
RDW: 15.7 % — ABNORMAL HIGH (ref 11.5–15.5)
WBC Count: 5.7 10*3/uL (ref 4.0–10.5)
nRBC: 0 % (ref 0.0–0.2)

## 2019-06-21 LAB — CMP (CANCER CENTER ONLY)
ALT: 26 U/L (ref 0–44)
AST: 31 U/L (ref 15–41)
Albumin: 3.8 g/dL (ref 3.5–5.0)
Alkaline Phosphatase: 145 U/L — ABNORMAL HIGH (ref 38–126)
Anion gap: 6 (ref 5–15)
BUN: 13 mg/dL (ref 8–23)
CO2: 28 mmol/L (ref 22–32)
Calcium: 9.3 mg/dL (ref 8.9–10.3)
Chloride: 104 mmol/L (ref 98–111)
Creatinine: 0.75 mg/dL (ref 0.44–1.00)
GFR, Est AFR Am: >60 mL/min (ref >60)
GFR, Estimated: >60 mL/min (ref >60)
Glucose, Bld: 92 mg/dL (ref 70–99)
Potassium: 4.2 mmol/L (ref 3.5–5.1)
Sodium: 138 mmol/L (ref 135–145)
Total Bilirubin: 0.6 mg/dL (ref 0.3–1.2)
Total Protein: 7.8 g/dL (ref 6.5–8.1)

## 2019-06-21 MED ORDER — ACETAMINOPHEN 325 MG PO TABS
650.0000 mg | ORAL_TABLET | Freq: Once | ORAL | Status: AC
  Administered 2019-06-21: 650 mg via ORAL

## 2019-06-21 MED ORDER — SODIUM CHLORIDE 0.9 % IV SOLN
3.6000 mg/kg | Freq: Once | INTRAVENOUS | Status: AC
  Administered 2019-06-21: 300 mg via INTRAVENOUS
  Filled 2019-06-21: qty 15

## 2019-06-21 MED ORDER — SODIUM CHLORIDE 0.9% FLUSH
10.0000 mL | Freq: Once | INTRAVENOUS | Status: AC
  Administered 2019-06-21: 10 mL
  Filled 2019-06-21: qty 10

## 2019-06-21 MED ORDER — SODIUM CHLORIDE 0.9 % IV SOLN
Freq: Once | INTRAVENOUS | Status: AC
  Administered 2019-06-21: 11:00:00 via INTRAVENOUS
  Filled 2019-06-21: qty 250

## 2019-06-21 MED ORDER — ACETAMINOPHEN 325 MG PO TABS
ORAL_TABLET | ORAL | Status: AC
  Filled 2019-06-21: qty 2

## 2019-06-21 MED ORDER — HEPARIN SOD (PORK) LOCK FLUSH 100 UNIT/ML IV SOLN
500.0000 [IU] | Freq: Once | INTRAVENOUS | Status: AC | PRN
  Administered 2019-06-21: 500 [IU]
  Filled 2019-06-21: qty 5

## 2019-06-21 MED ORDER — SODIUM CHLORIDE 0.9% FLUSH
10.0000 mL | INTRAVENOUS | Status: DC | PRN
  Administered 2019-06-21: 10 mL
  Filled 2019-06-21: qty 10

## 2019-06-21 MED ORDER — DIPHENHYDRAMINE HCL 25 MG PO CAPS
ORAL_CAPSULE | ORAL | Status: AC
  Filled 2019-06-21: qty 2

## 2019-06-21 MED ORDER — DIPHENHYDRAMINE HCL 25 MG PO CAPS
50.0000 mg | ORAL_CAPSULE | Freq: Once | ORAL | Status: AC
  Administered 2019-06-21: 50 mg via ORAL

## 2019-06-21 NOTE — Patient Instructions (Signed)
[facility] Discharge Instructions for Patients Receiving Chemotherapy  Today you received the following chemotherapy agents ado-trantuzumab emitansine  To help prevent nausea and vomiting after your treatment, we encourage you to take your nausea medication as directed by your MD.   If you develop nausea and vomiting that is not controlled by your nausea medication, call the clinic.   BELOW ARE SYMPTOMS THAT SHOULD BE REPORTED IMMEDIATELY:  *FEVER GREATER THAN 100.5 F  *CHILLS WITH OR WITHOUT FEVER  NAUSEA AND VOMITING THAT IS NOT CONTROLLED WITH YOUR NAUSEA MEDICATION  *UNUSUAL SHORTNESS OF BREATH  *UNUSUAL BRUISING OR BLEEDING  TENDERNESS IN MOUTH AND THROAT WITH OR WITHOUT PRESENCE OF ULCERS  *URINARY PROBLEMS  *BOWEL PROBLEMS  UNUSUAL RASH Items with * indicate a potential emergency and should be followed up as soon as possible.  Feel free to call the clinic should you have any questions or concerns. The clinic phone number is [phone].  Please show the [facility] at check-in to the Emergency Department and triage nurse.  Coronavirus (COVID-19) Are you at risk?  Are you at risk for the Coronavirus (COVID-19)?  To be considered HIGH RISK for Coronavirus (COVID-19), you have to meet the following criteria:  . Traveled to [place], [place], [place], [place] or [place]; or in the [place] to [place], [place], [place], or [place]; and have fever, cough, and shortness of breath within the last 2 weeks of travel OR . Been in close contact with a person diagnosed with COVID-19 within the last 2 weeks and have fever, cough, and shortness of breath . IF YOU DO NOT MEET THESE CRITERIA, YOU ARE CONSIDERED LOW RISK FOR COVID-19.  What to do if you are HIGH RISK for COVID-19?  Marland Kitchen If you are having a medical emergency, call 911. . Seek medical care right away. Before you go to a doctor's office, urgent care or emergency department, call ahead  and tell them about your recent travel, contact with someone diagnosed with COVID-19, and your symptoms. You should receive instructions from your physician's office regarding next steps of care.  . When you arrive at healthcare provider, tell the healthcare staff immediately you have returned from visiting [place], [place], [place], [place] or [place]; or traveled in the [place] to [place], [place], [place], or [place]; in the last two weeks or you have been in close contact with a person diagnosed with COVID-19 in the last 2 weeks.   . Tell the health care staff about your symptoms: fever, cough and shortness of breath. . After you have been seen by a medical provider, you will be either: o Tested for (COVID-19) and discharged home on quarantine except to seek medical care if symptoms worsen, and asked to  - Stay home and avoid contact with others until you get your results (4-5 days)  - Avoid travel on public transportation if possible (such as bus, train, or airplane) or o Sent to the Emergency Department by EMS for evaluation, COVID-19 testing, and possible admission depending on your condition and test results.  What to do if you are LOW RISK for COVID-19?  Reduce your risk of any infection by using the same precautions used for avoiding the common cold or flu:  Marland Kitchen Wash your hands often with soap and warm water for at least 20 seconds.  If soap and water are not readily available, use an alcohol-based hand sanitizer with at least 60% alcohol.  Marland Kitchen  If coughing or sneezing, cover your mouth and nose by coughing or sneezing into the elbow areas of your shirt or coat, into a tissue or into your sleeve (not your hands). . Avoid shaking hands with others and consider head nods or verbal greetings only. . Avoid touching your eyes, nose, or mouth with unwashed hands.  . Avoid close contact with people who are sick. . Avoid places or events with large numbers of people in one location, like  concerts or sporting events. . Carefully consider travel plans you have or are making. . If you are planning any travel outside or inside the [place], visit the CDC's Travelers' Health webpage for the latest health notices. . If you have some symptoms but not all symptoms, continue to monitor at home and seek medical attention if your symptoms worsen. . If you are having a medical emergency, call 911.   ADDITIONAL HEALTHCARE OPTIONS FOR PATIENTS  [facility] / e-Visit: [email or web address]         [facility]: [phone]  [facility]: [phone]                   [facility]: [phone]   

## 2019-06-21 NOTE — Patient Instructions (Signed)

## 2019-06-21 NOTE — Progress Notes (Signed)
  Patient Name: Judith Thomas MRN: [id number] DOB: Jun 13, 1956 Referring Physician: Chevis Pretty (Profile Not Attached) Date of Service: 05/06/2019 [facility], [place]                                                        End Of Treatment Note  Diagnoses: C50.211-Malignant neoplasm of upper-inner quadrant of right female breast Z17.0-Estrogen receptor positive status [ER+]  Cancer Staging: Malignant neoplasm of upper-inner quadrant of right breast in female, estrogen receptor positive ([place]) Staging form: Breast, AJCC 8th Edition - Clinical stage from 10/24/2018: Stage IA (cT1c(2), cN0, cM0, G2, ER+, PR+, HER2+) - Signed by Nicholas Lose, MD on 10/24/2018 - Pathologic stage from 03/29/2019: No Stage Recommended (ypT1b, pN0, cM0, G2, ER+, PR+, HER2: Equivocal) - Signed by Eppie Gibson, MD on 03/30/2019  Intent: Curative  Radiation Treatment Dates: 04/08/2019 through 05/06/2019 Site Technique Total Dose Dose per Fx Completed Fx Beam Energies  Breast: Breast_Rt 3D 40.05/40.05 Gy 2.67 15/15 6X, 10X  Breast: Breast_Rt_Bst 3D 10/10 2 5/5 6X, 10X   Narrative: The patient tolerated radiation therapy relatively well.    Plan: The patient will follow-up with radiation oncology in one month .  -----------------------------------  Eppie Gibson, MD

## 2019-06-25 ENCOUNTER — Ambulatory Visit: Payer: BC Managed Care – PPO | Admitting: Physical Therapy

## 2019-06-25 ENCOUNTER — Other Ambulatory Visit: Payer: Self-pay

## 2019-06-25 DIAGNOSIS — R293 Abnormal posture: Secondary | ICD-10-CM | POA: Diagnosis not present

## 2019-06-25 DIAGNOSIS — Z483 Aftercare following surgery for neoplasm: Secondary | ICD-10-CM

## 2019-06-25 DIAGNOSIS — L599 Disorder of the skin and subcutaneous tissue related to radiation, unspecified: Secondary | ICD-10-CM

## 2019-06-25 NOTE — Therapy (Signed)
[place], [place], [address] Phone: [phone]   Fax:  [phone]  Physical Therapy Treatment  Patient Details  Name: Judith Thomas MRN: [id number] Date of Birth: 11/18/1955 Referring Provider (PT): Dr. [name]    Encounter Date: 06/25/2019  PT End of Session - 06/25/19 1048    Visit Number  5    Number of Visits  10    Date for PT Re-Evaluation  07/10/19    PT Start Time  1000    PT Stop Time  1040    PT Time Calculation (min)  40 min    Activity Tolerance  Patient tolerated treatment well       Past Medical History:  Diagnosis Date  . Breast cancer ([facility])    right  . Family history of bladder cancer   . Family history of breast cancer   . Knee pain, chronic 10/11/2015    Past Surgical History:  Procedure Laterality Date  . BREAST LUMPECTOMY WITH RADIOACTIVE SEED AND SENTINEL LYMPH NODE BIOPSY Right 03/08/2019   Procedure: RIGHT BREAST RADIOACTIVE SEED LUMPECTOMY X2 AND SENTINEL LYMPH NODE BIOPSY;  Surgeon: Jovita Kussmaul, MD;  Location: [facility];  Service: General;  Laterality: Right;  . COLONOSCOPY  03/2010  . none    . PORTACATH PLACEMENT Left 11/07/2018   Procedure: INSERTION PORT-A-CATH WITH ULTRASOUND;  Surgeon: Jovita Kussmaul, MD;  Location: [facility];  Service: General;  Laterality: Left;    There were no vitals filed for this visit.  Subjective Assessment - 06/25/19 1004    Subjective  Pt says she is just having a little soreness in her abdominals and shoulders from the exercise, other wise she was able to do all the exercises without diffiuclty She feels like she is ready to discharge after this visit    Pertinent History  right breast cancer diagnosed on De. 19,  Pt had chemotherapy and had an allergic reaction to taxol and then had lumpectomy with 2 nodes removed May 29. 2020 Pt then had radiation completed 05/06/2019 and is taking Kadcycla    Patient Stated Goals  to get rid of the fullness and pulling in her arm    Currently in Pain?  No/denies                       [facility] Adult PT Treatment/Exercise - 06/25/19 0001      Exercises   Exercises  Other Exercises    Other Exercises   pt reports she has no questions about Strength ABC program       Shoulder Exercises: Sidelying   ABduction  AROM    ABduction Limitations   with stretch at the end range with makes the fullness at pec area become more prominent     Other Sidelying Exercises  small circles with hand pointed to ceiling       Manual Therapy   Edema Management  reviewed self technique by applying towel roll to fullness at axilla and doing MLD below it     Manual Lymphatic Drainage (MLD)  in right sidelying, left chest and lateral trunk. Extra time to right axilla and pec area with gentle compression at full area and MLD distal to it  posterior interaxillary anastamosis                   PT Long Term Goals - 06/25/19 1001      PT  LONG TERM GOAL #1   Title  pt wil report the swelling in her right axilla is decreased by 50%    Baseline  its still there, but is not as prominent    Status  Achieved      PT LONG TERM GOAL #2   Title  Pt will be independent in a home program for shoulder range of motion and strength    Status  Achieved      PT LONG TERM GOAL #3   Title  Pt wil verbalize understanding of lymphedema risk reduction precautions    Status  Achieved      PT LONG TERM GOAL #4   Title  Pt will be independent in self MLD and use of compression to help manage fullness at home    Status  Achieved      PT LONG TERM GOAL #5   Title  Pt will no longer have visible or palpable cord in forearm    Status  Achieved            Plan - 06/25/19 1049    Clinical Impression Statement  Pt continues to do very well.  She is doing self MLD and home exercise and fullness is right chest is improving, though is still present. Goals have  been met  She wants to work on her own for about a month and then will come back for one more recheck prior to dishcarge    PT Treatment/Interventions  ADLs/Self Care Home Management;DME Instruction;Therapeutic activities;Therapeutic exercise;Patient/family education;Orthotic Fit/Training;Manual techniques;Manual lymph drainage;Compression bandaging;Scar mobilization;Passive range of motion    PT Next Visit Plan  Reassess for goals and fullness at left pec area. Determine if ready for discharge       Patient will benefit from skilled therapeutic intervention in order to improve the following deficits and impairments:  Decreased skin integrity, Increased edema, Decreased scar mobility, Decreased range of motion, Decreased knowledge of precautions, Decreased strength, Impaired UE functional use, Postural dysfunction  Visit Diagnosis: Aftercare following surgery for neoplasm  Abnormal posture  Disorder of the skin and subcutaneous tissue related to radiation, unspecified     Problem List Patient Active Problem List   Diagnosis Date Noted  . Genetic testing 01/09/2019  . Family history of breast cancer   . Family history of bladder cancer   . Port-A-Cath in place 11/15/2018  . Malignant neoplasm of upper-inner quadrant of right breast in female, estrogen receptor positive ([facility]) 10/24/2018  . BMI 28.0-28.9,adult 07/28/2015   Donato Heinz. Owens Shark PT  Norwood Levo 06/25/2019, 10:51 AM  [place], [place], [address] Phone: [phone]   Fax:  [phone]  Name: Augustina Braddock MRN: [id number] Date of Birth: Nov 26, 1955

## 2019-06-27 ENCOUNTER — Encounter: Payer: BC Managed Care – PPO | Admitting: Physical Therapy

## 2019-07-02 ENCOUNTER — Encounter: Payer: BC Managed Care – PPO | Admitting: Physical Therapy

## 2019-07-04 ENCOUNTER — Encounter (HOSPITAL_COMMUNITY): Payer: Self-pay | Admitting: Internal Medicine

## 2019-07-04 ENCOUNTER — Ambulatory Visit (HOSPITAL_COMMUNITY)
Admission: RE | Admit: 2019-07-04 | Discharge: 2019-07-04 | Disposition: A | Discharge status: Home / Self Care | Payer: BC Managed Care – PPO | Source: Ambulatory Visit | Attending: Internal Medicine | Admitting: Internal Medicine

## 2019-07-04 ENCOUNTER — Encounter: Payer: BC Managed Care – PPO | Admitting: Physical Therapy

## 2019-07-04 ENCOUNTER — Ambulatory Visit (HOSPITAL_BASED_OUTPATIENT_CLINIC_OR_DEPARTMENT_OTHER)
Admission: RE | Admit: 2019-07-04 | Discharge: 2019-07-04 | Disposition: A | Discharge status: Home / Self Care | Payer: BC Managed Care – PPO | Source: Ambulatory Visit | Attending: Internal Medicine | Admitting: Internal Medicine

## 2019-07-04 ENCOUNTER — Other Ambulatory Visit: Payer: Self-pay

## 2019-07-04 VITALS — BP 156/86 | HR 93 | Wt 183.4 lb

## 2019-07-04 DIAGNOSIS — Z803 Family history of malignant neoplasm of breast: Secondary | ICD-10-CM | POA: Insufficient documentation

## 2019-07-04 DIAGNOSIS — Z8052 Family history of malignant neoplasm of bladder: Secondary | ICD-10-CM | POA: Insufficient documentation

## 2019-07-04 DIAGNOSIS — Z8249 Family history of ischemic heart disease and other diseases of the circulatory system: Secondary | ICD-10-CM | POA: Insufficient documentation

## 2019-07-04 DIAGNOSIS — Z170 Estrogen receptor positive status [ER+]: Secondary | ICD-10-CM | POA: Insufficient documentation

## 2019-07-04 DIAGNOSIS — I10 Essential (primary) hypertension: Secondary | ICD-10-CM | POA: Insufficient documentation

## 2019-07-04 DIAGNOSIS — Z808 Family history of malignant neoplasm of other organs or systems: Secondary | ICD-10-CM | POA: Insufficient documentation

## 2019-07-04 DIAGNOSIS — C50211 Malignant neoplasm of upper-inner quadrant of right female breast: Secondary | ICD-10-CM | POA: Diagnosis not present

## 2019-07-04 DIAGNOSIS — Z833 Family history of diabetes mellitus: Secondary | ICD-10-CM | POA: Insufficient documentation

## 2019-07-04 DIAGNOSIS — Z79899 Other long term (current) drug therapy: Secondary | ICD-10-CM | POA: Diagnosis not present

## 2019-07-04 DIAGNOSIS — Z888 Allergy status to other drugs, medicaments and biological substances status: Secondary | ICD-10-CM | POA: Insufficient documentation

## 2019-07-04 NOTE — Progress Notes (Signed)
  Echocardiogram 2D Echocardiogram has been performed.  Bobbye Charleston 07/04/2019, 1:48 PM

## 2019-07-04 NOTE — Addendum Note (Signed)
Encounter addended by: Shonna Chock, CMA on: [id number] 2:54 PM  Actions taken: Clinical Note Signed

## 2019-07-04 NOTE — Addendum Note (Signed)
Encounter addended by: Shonna Chock, CMA on: [id number] 2:56 PM  Actions taken: Order list changed, Diagnosis association updated

## 2019-07-04 NOTE — Progress Notes (Addendum)
Cardio-Oncology Clinic Consult Note   Referring Physician: Dr Lindi Adie Primary Care: Chevis Pretty, FNP Primary Cardiologist: Dr Haroldine Laws  HPI:  Judith Thomas is a 63 y.o. female with no past medical history who has been referred by Dr. Lindi Adie to establish in the cardio-oncology clinic for monitoring of cardio-toxicity while undergoing chemotherapy.        Malignant neoplasm of upper-inner quadrant of right breast in female, estrogen receptor positive ([place])   09/26/2018 Initial Diagnosis    Two right breast masses 12:00 to 1 o'clock position 1.3 cm and 1.2 cm, they are 1.5 cm apart.  Biopsy revealed grade 2 invasive ductal carcinoma ER 70%- 100%, PR 0% -20%, Ki-67 20%, HER-2 3+ by IHC and FISH ratio 2.15 with a gene copy number of 4.2 for the tumor that was 2+ by IHC, T1c N0 stage Ia    10/24/2018 Cancer Staging    Staging form: Breast, AJCC 8th Edition - Clinical stage from 10/24/2018: Stage IA (cT1c(2), cN0, cM0, G2, ER+, PR+, HER2+) - Signed by Nicholas Lose, MD on 10/24/2018    11/08/2018 -  Neo-Adjuvant Chemotherapy    Neoadjuvant chemotherapy withTaxol Herceptin weekly x42fllowed by Herceptin maintenance vsKadcylafor 1 year    01/09/2019 Genetic Testing    Negative genetic testing on the common hereditary cancer panel. The Hereditary Gene Panel offered by [facility] includes sequencing and/or deletion duplication testing of the following 48 genes: APC, ATM, AXIN2, BARD1, BMPR1A, BRCA1, BRCA2, BRIP1, CDH1, CDK4, CDKN2A (p14ARF), CDKN2A (p16INK4a), CHEK2, CTNNA1, DICER1, EPCAM (Deletion/duplication testing only), GREM1 (promoter region deletion/duplication testing only), KIT, MEN1, MLH1, MSH2, MSH3, MSH6, MUTYH, NBN, NF1, NHTL1, PALB2, PDGFRA, PMS2, POLD1, POLE, PTEN, RAD50, RAD51C, RAD51D, RNF43, SDHB, SDHC, SDHD, SMAD4, SMARCA4. STK11, TP53, TSC1, TSC2, and VHL.  The following genes were evaluated for sequence changes only: SDHA and HOXB13 c.251G>A variant only.  The report date is January 09, 2019.    03/08/2019 Surgery    Right lumpectomy ([name]: IDC s/p neoadjuvant treatment, grade 2, 0.7cm, ER+ 100%, PR+ 5%, HER2 equivocal, Ki67 20%, 0/2 right axillary sentinel lymph nodes negative for carcinoma, with clear margins.      Treatment Plan  1. Neoadjuvant chemotherapy with Taxol Herceptin weekly x12 followed by Herceptin maintenance vs Kadcyla for 1 year 2. Followed by adjuvant radiation therapy  3.  Followed by antiestrogen therapy  Echo 1/20  EF 60-65% GLS -19.9%   Echo 6/20 EF 60-65% Grade 2 DD GLS -19.5% Personally reviewed  Echo 9/20 EF 60-65% no strain imaging performed.    Had lumpectomy in May and finished chemo 01/24/19. Remains on Kadcyla. Tolerating. 7 treatments left. Walking 1.5-2.0 miles/day without CP or SOB. SBP running 130-140s.   PMHx - no major PHMHx  Current Outpatient Medications  Medication Sig Dispense Refill  . fluticasone (FLONASE) 50 MCG/ACT nasal spray Place 1 spray into both nostrils 2 (two) times daily. 16 g 2  . lidocaine-prilocaine (EMLA) cream Apply to affected area once 30 g 3  . loratadine (CLARITIN) 10 MG tablet Take 10 mg by mouth daily.    . Multiple Vitamin (MULTIVITAMIN) tablet Take 1 tablet by mouth daily.      No current facility-administered medications for this encounter.     Allergies  Allergen Reactions  . Paclitaxel Other (See Comments)    Nausea, flushing      Social History   Socioeconomic History  . Marital status: Married    Spouse name: michael  . Number of children: Not on file  . Years of  education: Not on file  . Highest education level: Not on file  Occupational History  . Not on file  Social Needs  . Financial resource strain: Not on file  . Food insecurity    Worry: Not on file    Inability: Not on file  . Transportation needs    Medical: No    Non-medical: No  Tobacco Use  . Smoking status: Never Smoker  . Smokeless tobacco: Never Used  Substance and  Sexual Activity  . Alcohol use: Yes    Comment: rare use  . Drug use: No  . Sexual activity: Not on file  Lifestyle  . Physical activity    Days per week: Not on file    Minutes per session: Not on file  . Stress: Not on file  Relationships  . Social Herbalist on phone: Not on file    Gets together: Not on file    Attends religious service: Not on file    Active member of club or organization: Not on file    Attends meetings of clubs or organizations: Not on file    Relationship status: Not on file  . Intimate partner violence    Fear of current or ex partner: No    Emotionally abused: No    Physically abused: No    Forced sexual activity: No  Other Topics Concern  . Not on file  Social History Narrative  . Not on file      Family History  Problem Relation Age of Onset  . Liver disease Mother   . Heart disease Mother        afib  . Heart disease Father   . Asthma Father   . Diabetes Father   . Bladder Cancer Father        smoker  . Breast cancer Maternal Aunt   . Heart disease Maternal Grandfather   . Kidney disease Paternal Grandmother   . Heart disease Paternal Grandfather   . Heart disease Other   . Breast cancer Maternal Aunt        mets to bone    Vitals:   07/04/19 1421  BP: (!) 156/86  Pulse: 93  SpO2: 98%  Weight: 83.2 kg (183 lb 6.4 oz)     PHYSICAL EXAM: General:  Well appearing. No resp difficulty HEENT: normal Neck: supple. no JVD. Carotids 2+ bilat; no bruits. No lymphadenopathy or thryomegaly appreciated. Cor: PMI nondisplaced. Regular rate & rhythm. No rubs, gallops or murmurs. Left port  Lungs: clear Abdomen: soft, nontender, nondistended. No hepatosplenomegaly. No bruits or masses. Good bowel sounds. Extremities: no cyanosis, clubbing, rash, edema Neuro: alert & orientedx3, cranial nerves grossly intact. moves all 4 extremities w/o difficulty. Affect pleasant   ASSESSMENT & PLAN:  1. Malignant neoplasm of upper-inner  quadrant of right breast in female, estrogen receptor positive ([place]) - Echo 1/20 shows EF 60-65% GLS -19.9% - She started chemo + Hercetpin on 11/08/2018 - Echo today EF 60-65% No strain performed - I reviewed echos personally. EF and Doppler parameters stable. No HF on exam. Continue Herceptin.  - Repeat echo in 5 months  2. HTN.  - BP elevated here. Has been running typically 130-140s but does have mild LVH on echo so may be persistent - I have asked her to keep a BP log and get back to me if SBP consistently > 140    Glori Bickers, MD  2:51 PM

## 2019-07-04 NOTE — Patient Instructions (Signed)
No blood work done today.  No medications changes were made. Please continue all current medications as prescribed.  Your physician has requested that you regularly monitor and record your blood pressure readings at home. Please use the same machine at the same time of day to check your readings and record them to bring to your follow-up visit.  Your physician recommends that you schedule a follow-up appointment in: 5 months with an echo prior to your exam. We will contact you to schedule both appointments.  Your physician has requested that you have an echocardiogram. Echocardiography is a painless test that uses sound waves to create images of your heart. It provides your doctor with information about the size and shape of your heart and how well your heart's chambers and valves are working. This procedure takes approximately one hour. There are no restrictions for this procedure.  At the [facility], you and your health needs are our priority. As part of our continuing mission to provide you with exceptional heart care, we have created designated Provider Care Teams. These Care Teams include your primary Cardiologist (physician) and Advanced Practice Providers (APPs- Physician Assistants and Nurse Practitioners) who all work together to provide you with the care you need, when you need it.   You may see any of the following providers on your designated Care Team at your next follow up: Marland Kitchen Dr Glori Bickers . Dr Loralie Champagne . Darrick Grinder, NP   Please be sure to bring in all your medications bottles to every appointment.

## 2019-07-10 NOTE — Progress Notes (Signed)
Patient Care Team: Chevis Pretty, FNP as PCP - General (Nurse Practitioner) Mauro Kaufmann, RN as Oncology Nurse Navigator Rockwell Germany, RN as Oncology Nurse Navigator  DIAGNOSIS:    ICD-10-CM   1. Malignant neoplasm of upper-inner quadrant of right breast in female, estrogen receptor positive ([place])  C50.211    Z17.0     SUMMARY OF ONCOLOGIC HISTORY: Oncology History  Malignant neoplasm of upper-inner quadrant of right breast in female, estrogen receptor positive ([name])  09/26/2018 Initial Diagnosis   Two right breast masses 12:00 to 1 o'clock position 1.3 cm and 1.2 cm, they are 1.5 cm apart.  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 The Hereditary Gene Panel offered by [facility] includes sequencing and/or deletion duplication testing of the following 48 genes: APC, ATM, AXIN2, BARD1, BMPR1A, BRCA1, BRCA2, BRIP1, CDH1, CDK4, CDKN2A (p14ARF), CDKN2A (p16INK4a), CHEK2, CTNNA1, DICER1, EPCAM (Deletion/duplication testing only), GREM1 (promoter region deletion/duplication testing only), KIT, MEN1, MLH1, MSH2, MSH3, MSH6, MUTYH, NBN, NF1, NHTL1, PALB2, PDGFRA, PMS2, POLD1, POLE, PTEN, RAD50, RAD51C, RAD51D, RNF43, SDHB, SDHC, SDHD, SMAD4, SMARCA4. STK11, TP53, TSC1, TSC2, and VHL.  The following genes were evaluated for sequence changes only: SDHA and HOXB13 c.251G>A variant only. The  report date is January 09, 2019.   03/08/2019 Surgery   Right lumpectomy [name]): IDC s/p neoadjuvant treatment, grade 2, 0.7cm, ER+ 100%, PR+ 5%, HER2 equivocal, Ki67 20%, 0/2 right axillary sentinel lymph nodes negative for carcinoma, with clear margins.    03/28/2019 -  Chemotherapy   The patient had ado-trastuzumab emtansine (KADCYLA) 300 mg in sodium chloride 0.9 % 250 mL chemo infusion, 3.6 mg/kg = 300 mg, Intravenous, Once, 5 of 11 cycles Administration: 300 mg (03/28/2019), 300 mg (04/18/2019), 300 mg (06/21/2019), 300 mg (05/09/2019), 300 mg (05/30/2019)  for chemotherapy treatment.    03/29/2019 Cancer Staging   Staging form: Breast, AJCC 8th Edition - Pathologic stage from 03/29/2019: No Stage Recommended (ypT1b, pN0, cM0, G2, ER+, PR+, HER2: Equivocal) - Signed by Eppie Gibson, MD on 03/30/2019   04/09/2019 - 05/06/2019 Radiation Therapy   Adjuvant XRT     CHIEF COMPLIANT: Kadcyla maintenance  INTERVAL HISTORY: Judith Thomas is a 63 y.o. with above-mentioned history of right breast cancer treated with neoadjuvant chemotherapy, lumpectomy, and radiation. She is a participant in the [employer] clinical trial. Echo on 07/04/19 showed an ejection fraction of 60-65%. She presents to the clinic todayforKadcyla maintenance.  She has been tolerating Kadcyla extremely well.  Does not have any GI toxicities.  LFTs have been normal.  Denies neuropathy.  REVIEW OF SYSTEMS:   Constitutional: Denies fevers, chills or abnormal weight loss Eyes: Denies blurriness of vision Ears, nose, mouth, throat, and face: Denies mucositis or sore throat Respiratory: Denies cough, dyspnea or wheezes Cardiovascular: Denies palpitation, chest discomfort Gastrointestinal: Denies nausea, heartburn or change in bowel habits Skin:  Denies abnormal skin rashes Lymphatics: Denies new lymphadenopathy or easy bruising Neurological: Denies numbness, tingling or new weaknesses Behavioral/Psych: Mood is stable, no new changes   Extremities: No lower extremity edema Breast: denies any pain or lumps or nodules in either breasts All other systems were reviewed with the patient and are negative.  I have reviewed the past medical history, past surgical history, social history and family history with the patient and they are unchanged from previous note.  ALLERGIES:  is allergic to paclitaxel.  MEDICATIONS:  Current Outpatient Medications  Medication Sig Dispense Refill  . fluticasone (FLONASE) 50 MCG/ACT nasal spray Place 1 spray into both nostrils 2 (two) times daily. 16 g 2  . lidocaine-prilocaine (EMLA) cream Apply to affected area once 30 g 3  . loratadine (CLARITIN) 10 MG tablet Take 10 mg by mouth daily.    . Multiple Vitamin (MULTIVITAMIN) tablet Take 1 tablet by mouth daily.      No current facility-administered medications for this visit.     PHYSICAL EXAMINATION: ECOG PERFORMANCE STATUS: 1 - Symptomatic but completely ambulatory  Vitals:   07/11/19 0820  BP: (!) 142/81  Pulse: 86  Resp: 18  Temp: 98.5 F (36.9 C)  SpO2: 98%   Filed Weights   07/11/19 0820  Weight: 180 lb 6.4 oz (81.8 kg)    GENERAL: alert, no distress and comfortable SKIN: skin color, texture, turgor are normal, no rashes or significant lesions EYES: normal, Conjunctiva are pink and non-injected, sclera clear OROPHARYNX: no exudate, no erythema and lips, buccal mucosa, and tongue normal  NECK: supple, thyroid normal size, non-tender, without nodularity LYMPH: no palpable lymphadenopathy in the cervical, axillary or inguinal LUNGS: clear to auscultation and percussion with normal breathing effort HEART: regular rate & rhythm and no murmurs and no lower extremity edema ABDOMEN: abdomen soft, non-tender and normal bowel sounds MUSCULOSKELETAL: no cyanosis of digits and no clubbing  NEURO: alert & oriented x 3 with fluent speech, no focal motor/sensory deficits EXTREMITIES: No lower extremity edema  LABORATORY DATA:  I  have reviewed the data as listed CMP Latest Ref Rng & Units 06/21/2019 05/30/2019 05/09/2019  Glucose 70 - 99 mg/dL 92 125(H) 93  BUN 8 - 23 mg/dL '13 13 20  '$ Creatinine 0.44 - 1.00 mg/dL 0.75 0.77 0.76  Sodium 135 - 145 mmol/L 138 140 140  Potassium 3.5 - 5.1 mmol/L 4.2 4.0 4.4  Chloride 98 - 111 mmol/L 104 105 106  CO2 22 - 32 mmol/L '28 25 23  '$ Calcium 8.9 - 10.3 mg/dL 9.3 8.8(L) 9.3  Total Protein 6.5 - 8.1 g/dL 7.8 7.3 7.4  Total Bilirubin 0.3 - 1.2 mg/dL 0.6 0.6 0.5  Alkaline Phos 38 - 126 U/L 145(H) 150(H) 127(H)  AST 15 - 41 U/L 31 37 28  ALT 0 - 44 U/L 26 40 32    Lab Results  Component Value Date   WBC 6.0 07/11/2019   HGB 12.2 07/11/2019   HCT 38.6 07/11/2019   MCV 91.9 07/11/2019   PLT 311 07/11/2019   NEUTROABS 4.3 07/11/2019    ASSESSMENT & PLAN:  Malignant neoplasm of upper-inner quadrant of right breast in female, estrogen receptor positive ([place]) 09/27/19:Two right breast masses 12:00 to 1 o'clock position 1.3 cm and 1.2 cm, they are 1.5 cm apart. Biopsy revealed grade 2 invasive ductal carcinoma ER 70%- 100%, PR 0% -20%, Ki-67 20%, HER-2 3+ by IHC and FISH ratio 2.15 with a gene copy number of 4.2 for the tumor that  was 2+ by IHC, T1c N0 stage Ia  Treatment plan: 1. Neoadjuvant chemotherapy withTaxol Herceptin weekly x12completed 4/16/2020followed by Herceptin maintenance vsKadcylafor 1 year 2.03/08/2019: Right lumpectomy IDC s/p neoadjuvant treatment, grade 2, 0.7cm, ER+ 100%, PR+ 5%, HER2 equivocal, Ki67 20%, 0/2 right axillary sentinel lymph nodes negative for carcinoma, with clear margins. 3. Followed by adjuvant radiation therapy 4.Followed by antiestrogen therapy ------------------------------------------------------------------------------------------------------------------------------------------------------------ Treatment plan:Kadcyla maintenance therapy started 03/28/2019 to be completed 10/24/2019 Adjuvant radiation therapy:Started 04/09/2019  completed 05/06/2019 Kadcyla toxicities:Denies any side effects from Kadcyla.  Labs reviewed. Denies neuropathy Muscle pains in the arm and leg probably Kadcyla related She is discussing about traveling to [place] for a brief vacation to be with her sister.  Return to clinic every 3 weeks for [facility] every 6 weeks for follow-up with me.    No orders of the defined types were placed in this encounter.  The patient has a good understanding of the overall plan. she agrees with it. she will call with any problems that may develop before the next visit here.  Nicholas Lose, MD 07/11/2019  Julious Oka Dorshimer am acting as scribe for Dr. Nicholas Lose.  I have reviewed the above documentation for accuracy and completeness, and I agree with the above.

## 2019-07-11 ENCOUNTER — Inpatient Hospital Stay: Payer: BC Managed Care – PPO | Attending: Hematology and Oncology

## 2019-07-11 ENCOUNTER — Inpatient Hospital Stay: Payer: BC Managed Care – PPO

## 2019-07-11 ENCOUNTER — Inpatient Hospital Stay (HOSPITAL_BASED_OUTPATIENT_CLINIC_OR_DEPARTMENT_OTHER): Payer: BC Managed Care – PPO | Admitting: Hematology and Oncology

## 2019-07-11 ENCOUNTER — Other Ambulatory Visit: Payer: Self-pay

## 2019-07-11 DIAGNOSIS — Z5112 Encounter for antineoplastic immunotherapy: Secondary | ICD-10-CM | POA: Insufficient documentation

## 2019-07-11 DIAGNOSIS — C50211 Malignant neoplasm of upper-inner quadrant of right female breast: Secondary | ICD-10-CM

## 2019-07-11 DIAGNOSIS — Z170 Estrogen receptor positive status [ER+]: Secondary | ICD-10-CM

## 2019-07-11 DIAGNOSIS — Z95828 Presence of other vascular implants and grafts: Secondary | ICD-10-CM

## 2019-07-11 LAB — CMP (CANCER CENTER ONLY)
ALT: 28 U/L (ref 0–44)
AST: 37 U/L (ref 15–41)
Albumin: 3.6 g/dL (ref 3.5–5.0)
Alkaline Phosphatase: 139 U/L — ABNORMAL HIGH (ref 38–126)
Anion gap: 10 (ref 5–15)
BUN: 12 mg/dL (ref 8–23)
CO2: 25 mmol/L (ref 22–32)
Calcium: 9.5 mg/dL (ref 8.9–10.3)
Chloride: 104 mmol/L (ref 98–111)
Creatinine: 0.73 mg/dL (ref 0.44–1.00)
GFR, Est AFR Am: >60 mL/min (ref >60)
GFR, Estimated: >60 mL/min (ref >60)
Glucose, Bld: 102 mg/dL — ABNORMAL HIGH (ref 70–99)
Potassium: 4.2 mmol/L (ref 3.5–5.1)
Sodium: 139 mmol/L (ref 135–145)
Total Bilirubin: 0.7 mg/dL (ref 0.3–1.2)
Total Protein: 7.6 g/dL (ref 6.5–8.1)

## 2019-07-11 LAB — CBC WITH DIFFERENTIAL (CANCER CENTER ONLY)
Abs Immature Granulocytes: 0.01 10*3/uL (ref 0.00–0.07)
Basophils Absolute: 0 10*3/uL (ref 0.0–0.1)
Basophils Relative: 1 %
Eosinophils Absolute: 0.2 10*3/uL (ref 0.0–0.5)
Eosinophils Relative: 3 %
HCT: 38.6 % (ref 36.0–46.0)
Hemoglobin: 12.2 g/dL (ref 12.0–15.0)
Immature Granulocytes: 0 %
Lymphocytes Relative: 18 %
Lymphs Abs: 1.1 10*3/uL (ref 0.7–4.0)
MCH: 29 pg (ref 26.0–34.0)
MCHC: 31.6 g/dL (ref 30.0–36.0)
MCV: 91.9 fL (ref 80.0–100.0)
Monocytes Absolute: 0.5 10*3/uL (ref 0.1–1.0)
Monocytes Relative: 8 %
Neutro Abs: 4.3 10*3/uL (ref 1.7–7.7)
Neutrophils Relative %: 70 %
Platelet Count: 311 10*3/uL (ref 150–400)
RBC: 4.2 MIL/uL (ref 3.87–5.11)
RDW: 15.7 % — ABNORMAL HIGH (ref 11.5–15.5)
WBC Count: 6 10*3/uL (ref 4.0–10.5)
nRBC: 0 % (ref 0.0–0.2)

## 2019-07-11 MED ORDER — SODIUM CHLORIDE 0.9% FLUSH
10.0000 mL | INTRAVENOUS | Status: DC | PRN
  Administered 2019-07-11: 10 mL
  Filled 2019-07-11: qty 10

## 2019-07-11 MED ORDER — ACETAMINOPHEN 325 MG PO TABS
ORAL_TABLET | ORAL | Status: AC
  Filled 2019-07-11: qty 2

## 2019-07-11 MED ORDER — HEPARIN SOD (PORK) LOCK FLUSH 100 UNIT/ML IV SOLN
500.0000 [IU] | Freq: Once | INTRAVENOUS | Status: AC | PRN
  Administered 2019-07-11: 500 [IU]
  Filled 2019-07-11: qty 5

## 2019-07-11 MED ORDER — DIPHENHYDRAMINE HCL 25 MG PO CAPS
50.0000 mg | ORAL_CAPSULE | Freq: Once | ORAL | Status: AC
  Administered 2019-07-11: 50 mg via ORAL

## 2019-07-11 MED ORDER — SODIUM CHLORIDE 0.9 % IV SOLN
3.6000 mg/kg | Freq: Once | INTRAVENOUS | Status: AC
  Administered 2019-07-11: 300 mg via INTRAVENOUS
  Filled 2019-07-11: qty 15

## 2019-07-11 MED ORDER — SODIUM CHLORIDE 0.9% FLUSH
10.0000 mL | Freq: Once | INTRAVENOUS | Status: AC
  Administered 2019-07-11: 10 mL
  Filled 2019-07-11: qty 10

## 2019-07-11 MED ORDER — ACETAMINOPHEN 325 MG PO TABS
650.0000 mg | ORAL_TABLET | Freq: Once | ORAL | Status: AC
  Administered 2019-07-11: 650 mg via ORAL

## 2019-07-11 MED ORDER — DIPHENHYDRAMINE HCL 25 MG PO CAPS
ORAL_CAPSULE | ORAL | Status: AC
  Filled 2019-07-11: qty 2

## 2019-07-11 MED ORDER — SODIUM CHLORIDE 0.9 % IV SOLN
Freq: Once | INTRAVENOUS | Status: AC
  Administered 2019-07-11: 09:00:00 via INTRAVENOUS
  Filled 2019-07-11: qty 250

## 2019-07-11 NOTE — Assessment & Plan Note (Signed)
09/27/19:Two right breast masses 12:00 to 1 o'clock position 1.3 cm and 1.2 cm, they are 1.5 cm apart. 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 Neoadjuvant chemotherapy withTaxol Herceptin weekly x12completed 4/16/2020followed by Herceptin maintenance vsKadcylafor 1 year 2.03/08/2019: Right lumpectomy IDC s/p neoadjuvant treatment, grade 2, 0.7cm, ER+ 100%, PR+ 5%, HER2 equivocal, Ki67 20%, 0/2 right axillary sentinel lymph nodes negative for carcinoma, with clear margins. 3. Followed by adjuvant radiation therapy 4.Followed by antiestrogen therapy ------------------------------------------------------------------------------------------------------------------------------------------------------------ Treatment plan:Kadcyla maintenance therapy started 6/18/2020to be completed 10/24/2019 Adjuvant radiation therapy:Started 04/09/2019 completed 05/06/2019 Kadcyla toxicities:Denies any side effects from Kadcyla.  Labs reviewed. Denies neuropathy Muscle pains in the arm and leg probably Kadcyla related She is discussing about traveling to [place] for a brief vacation to be with her sister.  Return to clinic every 3 weeks for Kadcylaand every 6 weeks for follow-up with me.  

## 2019-07-11 NOTE — Patient Instructions (Signed)
[facility] Discharge Instructions for Patients Receiving Chemotherapy  Today you received the following chemotherapy agents :  Ado-trastuzumab emtansine.  To help prevent nausea and vomiting after your treatment, we encourage you to take your nausea medication as prescribed.   If you develop nausea and vomiting that is not controlled by your nausea medication, call the clinic.   BELOW ARE SYMPTOMS THAT SHOULD BE REPORTED IMMEDIATELY:  *FEVER GREATER THAN 100.5 F  *CHILLS WITH OR WITHOUT FEVER  NAUSEA AND VOMITING THAT IS NOT CONTROLLED WITH YOUR NAUSEA MEDICATION  *UNUSUAL SHORTNESS OF BREATH  *UNUSUAL BRUISING OR BLEEDING  TENDERNESS IN MOUTH AND THROAT WITH OR WITHOUT PRESENCE OF ULCERS  *URINARY PROBLEMS  *BOWEL PROBLEMS  UNUSUAL RASH Items with * indicate a potential emergency and should be followed up as soon as possible.  Feel free to call the clinic should you have any questions or concerns. The clinic phone number is [phone].  Please show the CHEMO ALERT CARD at check-in to the Emergency Department and triage nurse.   

## 2019-07-31 ENCOUNTER — Encounter: Payer: Self-pay | Admitting: Physical Therapy

## 2019-07-31 ENCOUNTER — Ambulatory Visit: Payer: BC Managed Care – PPO | Attending: General Surgery | Admitting: Physical Therapy

## 2019-07-31 DIAGNOSIS — R293 Abnormal posture: Secondary | ICD-10-CM | POA: Diagnosis not present

## 2019-07-31 DIAGNOSIS — L599 Disorder of the skin and subcutaneous tissue related to radiation, unspecified: Secondary | ICD-10-CM | POA: Insufficient documentation

## 2019-07-31 DIAGNOSIS — Z483 Aftercare following surgery for neoplasm: Secondary | ICD-10-CM | POA: Insufficient documentation

## 2019-07-31 NOTE — Therapy (Signed)
[facility] [address] [place], [place], [address] Phone: [phone]   Fax:  [phone]  Physical Therapy Treatment  Patient Details  Name: Judith Thomas MRN: [id number] Date of Birth: 08/05/1956 Referring Provider (PT): Dr. Toth    Encounter Date: 07/31/2019  PT End of Session - 07/31/19 1100    Visit Number  6    Number of Visits  10    Date for PT Re-Evaluation  07/10/19    PT Start Time  1000    PT Stop Time  1045    PT Time Calculation (min)  45 min    Activity Tolerance  Patient tolerated treatment well    Behavior During Therapy  WFL for tasks assessed/performed       Past Medical History:  Diagnosis Date  . Breast cancer (HCC)    right  . Family history of bladder cancer   . Family history of breast cancer   . Knee pain, chronic 10/11/2015    Past Surgical History:  Procedure Laterality Date  . BREAST LUMPECTOMY WITH RADIOACTIVE SEED AND SENTINEL LYMPH NODE BIOPSY Right 03/08/2019   Procedure: RIGHT BREAST RADIOACTIVE SEED LUMPECTOMY X2 AND SENTINEL LYMPH NODE BIOPSY;  Surgeon: Toth, Paul III, MD;  Location: [facility];  Service: General;  Laterality: Right;  . COLONOSCOPY  03/2010  . none    . PORTACATH PLACEMENT Left 11/07/2018   Procedure: INSERTION PORT-A-CATH WITH ULTRASOUND;  Surgeon: Toth, Paul III, MD;  Location: [facility];  Service: General;  Laterality: Left;    There were no vitals filed for this visit.  Subjective Assessment - 07/31/19 1009    Subjective  Pt had a fall few days ago where the dog pulled her down and she fell on her right side.  She is having some stiffness in the shoulder but it she is stretching and it feels better.    Pertinent History  right breast cancer diagnosed on De. 19,  Pt had chemotherapy and had an allergic reaction to taxol and then had lumpectomy with 2 nodes removed May 29. 2020 Pt then had radiation completed 05/06/2019 and is  taking Kadcycla    Currently in Pain?  No/denies         [facility] PT Assessment - 07/31/19 0001      Observation/Other Assessments   Observations  pt has some visible fullness at right hand dorsal and ventral thenar space. She still has full palpable firm area at right axilla about and egg shape that is slightly tender.  Pt feels that this is worse since she had her fall .Before then, pt felt there were times when she felt it had gone away     Quick DASH   4.55      AROM   Right Shoulder Flexion  155 Degrees    Right Shoulder ABduction  170 Degrees        LYMPHEDEMA/ONCOLOGY QUESTIONNAIRE - 07/31/19 1012      Right Upper Extremity Lymphedema   10 cm Proximal to Olecranon Process  31 cm    Olecranon Process  25.5 cm    10 cm Proximal to Ulnar Styloid Process  21.8 cm    Just Proximal to Ulnar Styloid Process  16.5 cm    Across Hand at Thumb Web Space  19.7 cm    At Base of 2nd Digit  5.9 cm        Quick Dash - 07/31/19 0001    Open   a tight or new jar  No difficulty    Do heavy household chores (wash walls, wash floors)  No difficulty    Carry a shopping bag or briefcase  No difficulty    Wash your back  No difficulty    Use a knife to cut food  No difficulty    Recreational activities in which you take some force or impact through your arm, shoulder, or hand (golf, hammering, tennis)  No difficulty    During the past week, to what extent has your arm, shoulder or hand problem limited your work or other regular daily activities  Slightly    Arm, shoulder, or hand pain.  Mild    Tingling (pins and needles) in your arm, shoulder, or hand  Mild    Difficulty Sleeping  No difficulty    DASH Score  4.55 %             [facility] Adult PT Treatment/Exercise - 07/31/19 0001      Self-Care   Self-Care  Other Self-Care Comments    Other Self-Care Comments   recommended that pt wear her sleeve and gauntlet and compression bra with extra foam patch to firm area at axilla        Manual Therapy   Manual Lymphatic Drainage (MLD)  Reviewed and performed  manual lymph draiange to right arm and lateral chest and axilla . In supine, short neck, superficial and deep abdominals, right inguinal nodes, right axillo-inguinal anastamosis, left axillary nodes, anterior interaxillary anastamosis, left arm to hand and return along pathways,  Then to sidelying for posterior interaxillary anastamosis                   PT Long Term Goals - 07/31/19 1107      PT LONG TERM GOAL #1   Title  pt wil report the swelling in her right axilla is decreased by 50%    Baseline  its still there, but is not as prominent    Status  Achieved      PT LONG TERM GOAL #2   Title  Pt will be independent in a home program for shoulder range of motion and strength    Status  Achieved      PT LONG TERM GOAL #3   Title  Pt wil verbalize understanding of lymphedema risk reduction precautions    Status  Achieved      PT LONG TERM GOAL #4   Title  Pt will be independent in self MLD and use of compression to help manage fullness at home    Status  Achieved      PT LONG TERM GOAL #5   Title  Pt will no longer have visible or palpable cord in forearm    Status  Achieved            Plan - 07/31/19 1101    Clinical Impression Statement  Pt had a fall over the weekend and felt that she pulled her her right arm. She is having some swelling around her right thumb and at the seroma area near right axilla.  She has not been wearing her compression. She feels that she can do the self MLD and will wear her compression sleeve and gauntlet and compression bra with foam patch daily. She feels that will help her control these symtoms and wants to discharge from physical therapy at this time.  She knows that she can call back for another episode of PT if her  symptoms do not resolve. Goals have been met    Comorbidities  surgery, previous chemo , ongoing radiation    PT Treatment/Interventions  ADLs/Self  Care Home Management;DME Instruction;Therapeutic activities;Therapeutic exercise;Patient/family education;Orthotic Fit/Training;Manual techniques;Manual lymph drainage;Compression bandaging;Scar mobilization;Passive range of motion    PT Next Visit Plan  Discharge    Consulted and Agree with Plan of Care  Patient       Patient will benefit from skilled therapeutic intervention in order to improve the following deficits and impairments:  Decreased skin integrity, Increased edema, Decreased scar mobility, Decreased range of motion, Decreased knowledge of precautions, Decreased strength, Impaired UE functional use, Postural dysfunction  Visit Diagnosis: Aftercare following surgery for neoplasm  Abnormal posture  Disorder of the skin and subcutaneous tissue related to radiation, unspecified     Problem List Patient Active Problem List   Diagnosis Date Noted  . Genetic testing 01/09/2019  . Family history of breast cancer   . Family history of bladder cancer   . Port-A-Cath in place 11/15/2018  . Malignant neoplasm of upper-inner quadrant of right breast in female, estrogen receptor positive ([place]) 10/24/2018  . BMI 28.0-28.9,adult 07/28/2015   PHYSICAL THERAPY DISCHARGE SUMMARY  Visits from Start of Care: 6  Current functional level related to goals / functional outcomes: Pt feels independent in self management   Remaining deficits: Swelling in hand, wrist  and fullness at seroma after a fall over the weekend.  She feels she is improvming and can manage it at home    Education / Equipment: Lymphedema management, home exercise. Pt has compression sleeve, gauntlet and bra  Plan: Patient agrees to discharge.  Patient goals were met. [name] being discharged due to being pleased with the current functional level.  ?????   Donato Heinz. Owens Shark PT  Norwood Levo 07/31/2019, 12:00 PM  [place], [place], [address] Phone: [phone]   Fax:  [phone]  Name: Judith Thomas MRN: [id number] Date of Birth: 1956/02/20

## 2019-08-01 ENCOUNTER — Inpatient Hospital Stay: Payer: BC Managed Care – PPO

## 2019-08-01 ENCOUNTER — Other Ambulatory Visit: Payer: Self-pay

## 2019-08-01 VITALS — BP 126/79 | HR 77 | Temp 98.2°F | Resp 17 | Ht 65.0 in | Wt 182.2 lb

## 2019-08-01 DIAGNOSIS — Z95828 Presence of other vascular implants and grafts: Secondary | ICD-10-CM

## 2019-08-01 DIAGNOSIS — Z5112 Encounter for antineoplastic immunotherapy: Secondary | ICD-10-CM | POA: Diagnosis not present

## 2019-08-01 DIAGNOSIS — C50211 Malignant neoplasm of upper-inner quadrant of right female breast: Secondary | ICD-10-CM | POA: Diagnosis not present

## 2019-08-01 DIAGNOSIS — Z170 Estrogen receptor positive status [ER+]: Secondary | ICD-10-CM

## 2019-08-01 LAB — CBC WITH DIFFERENTIAL (CANCER CENTER ONLY)
Abs Immature Granulocytes: 0.02 10*3/uL (ref 0.00–0.07)
Basophils Absolute: 0 10*3/uL (ref 0.0–0.1)
Basophils Relative: 1 %
Eosinophils Absolute: 0.2 10*3/uL (ref 0.0–0.5)
Eosinophils Relative: 3 %
HCT: 38.4 % (ref 36.0–46.0)
Hemoglobin: 12.1 g/dL (ref 12.0–15.0)
Immature Granulocytes: 0 %
Lymphocytes Relative: 18 %
Lymphs Abs: 1 10*3/uL (ref 0.7–4.0)
MCH: 29 pg (ref 26.0–34.0)
MCHC: 31.5 g/dL (ref 30.0–36.0)
MCV: 92.1 fL (ref 80.0–100.0)
Monocytes Absolute: 0.5 10*3/uL (ref 0.1–1.0)
Monocytes Relative: 9 %
Neutro Abs: 3.8 10*3/uL (ref 1.7–7.7)
Neutrophils Relative %: 69 %
Platelet Count: 301 10*3/uL (ref 150–400)
RBC: 4.17 MIL/uL (ref 3.87–5.11)
RDW: 16 % — ABNORMAL HIGH (ref 11.5–15.5)
WBC Count: 5.4 10*3/uL (ref 4.0–10.5)
nRBC: 0 % (ref 0.0–0.2)

## 2019-08-01 LAB — CMP (CANCER CENTER ONLY)
ALT: 28 U/L (ref 0–44)
AST: 36 U/L (ref 15–41)
Albumin: 3.5 g/dL (ref 3.5–5.0)
Alkaline Phosphatase: 152 U/L — ABNORMAL HIGH (ref 38–126)
Anion gap: 12 (ref 5–15)
BUN: 14 mg/dL (ref 8–23)
CO2: 24 mmol/L (ref 22–32)
Calcium: 9.8 mg/dL (ref 8.9–10.3)
Chloride: 104 mmol/L (ref 98–111)
Creatinine: 0.74 mg/dL (ref 0.44–1.00)
GFR, Est AFR Am: >60 mL/min (ref >60)
GFR, Estimated: >60 mL/min (ref >60)
Glucose, Bld: 96 mg/dL (ref 70–99)
Potassium: 4.3 mmol/L (ref 3.5–5.1)
Sodium: 140 mmol/L (ref 135–145)
Total Bilirubin: 0.6 mg/dL (ref 0.3–1.2)
Total Protein: 7.8 g/dL (ref 6.5–8.1)

## 2019-08-01 MED ORDER — DIPHENHYDRAMINE HCL 25 MG PO CAPS
ORAL_CAPSULE | ORAL | Status: AC
  Filled 2019-08-01: qty 2

## 2019-08-01 MED ORDER — HEPARIN SOD (PORK) LOCK FLUSH 100 UNIT/ML IV SOLN
500.0000 [IU] | Freq: Once | INTRAVENOUS | Status: AC | PRN
  Administered 2019-08-01: 500 [IU]
  Filled 2019-08-01: qty 5

## 2019-08-01 MED ORDER — SODIUM CHLORIDE 0.9% FLUSH
10.0000 mL | INTRAVENOUS | Status: DC | PRN
  Administered 2019-08-01: 10 mL
  Filled 2019-08-01: qty 10

## 2019-08-01 MED ORDER — SODIUM CHLORIDE 0.9 % IV SOLN
Freq: Once | INTRAVENOUS | Status: AC
  Administered 2019-08-01: 09:00:00 via INTRAVENOUS
  Filled 2019-08-01: qty 250

## 2019-08-01 MED ORDER — ACETAMINOPHEN 325 MG PO TABS
650.0000 mg | ORAL_TABLET | Freq: Once | ORAL | Status: AC
  Administered 2019-08-01: 650 mg via ORAL

## 2019-08-01 MED ORDER — SODIUM CHLORIDE 0.9 % IV SOLN
3.6000 mg/kg | Freq: Once | INTRAVENOUS | Status: AC
  Administered 2019-08-01: 300 mg via INTRAVENOUS
  Filled 2019-08-01: qty 15

## 2019-08-01 MED ORDER — ACETAMINOPHEN 325 MG PO TABS
ORAL_TABLET | ORAL | Status: AC
  Filled 2019-08-01: qty 2

## 2019-08-01 MED ORDER — DIPHENHYDRAMINE HCL 25 MG PO CAPS
50.0000 mg | ORAL_CAPSULE | Freq: Once | ORAL | Status: AC
  Administered 2019-08-01: 50 mg via ORAL

## 2019-08-01 MED ORDER — SODIUM CHLORIDE 0.9% FLUSH
10.0000 mL | Freq: Once | INTRAVENOUS | Status: AC
  Administered 2019-08-01: 10 mL
  Filled 2019-08-01: qty 10

## 2019-08-01 NOTE — Patient Instructions (Signed)
[facility] Discharge Instructions for Patients Receiving Chemotherapy  Today you received the following chemotherapy agents: Ado-Trastuzumab Emtansine (Kadcyla)  To help prevent nausea and vomiting after your treatment, we encourage you to take your nausea medication as directed.     If you develop nausea and vomiting that is not controlled by your nausea medication, call the clinic.   BELOW ARE SYMPTOMS THAT SHOULD BE REPORTED IMMEDIATELY:  *FEVER GREATER THAN 100.5 F  *CHILLS WITH OR WITHOUT FEVER  NAUSEA AND VOMITING THAT IS NOT CONTROLLED WITH YOUR NAUSEA MEDICATION  *UNUSUAL SHORTNESS OF BREATH  *UNUSUAL BRUISING OR BLEEDING  TENDERNESS IN MOUTH AND THROAT WITH OR WITHOUT PRESENCE OF ULCERS  *URINARY PROBLEMS  *BOWEL PROBLEMS  UNUSUAL RASH Items with * indicate a potential emergency and should be followed up as soon as possible.  Feel free to call the clinic should you have any questions or concerns. The clinic phone number is [phone].  Please show the CHEMO ALERT CARD at check-in to the Emergency Department and triage nurse.  Coronavirus (COVID-19) Are you at risk?  Are you at risk for the Coronavirus (COVID-19)?  To be considered HIGH RISK for Coronavirus (COVID-19), you have to meet the following criteria:  . Traveled to [place], [place], [place], [place] or [place]; or in the [place] to [place], [place], [place], or [place]; and have fever, cough, and shortness of breath within the last 2 weeks of travel OR . Been in close contact with a person diagnosed with COVID-19 within the last 2 weeks and have fever, cough, and shortness of breath . IF YOU DO NOT MEET THESE CRITERIA, YOU ARE CONSIDERED LOW RISK FOR COVID-19.  What to do if you are HIGH RISK for COVID-19?  . If you are having a medical emergency, call 911. . Seek medical care right away. Before you go to a doctor's office, urgent care or emergency department, call ahead  and tell them about your recent travel, contact with someone diagnosed with COVID-19, and your symptoms. You should receive instructions from your physician's office regarding next steps of care.  . When you arrive at healthcare provider, tell the healthcare staff immediately you have returned from visiting [place], [place], [place], [place] or [place]; or traveled in the [place] to [place], [place], [place], or [place]; in the last two weeks or you have been in close contact with a person diagnosed with COVID-19 in the last 2 weeks.   . Tell the health care staff about your symptoms: fever, cough and shortness of breath. . After you have been seen by a medical provider, you will be either: o Tested for (COVID-19) and discharged home on quarantine except to seek medical care if symptoms worsen, and asked to  - Stay home and avoid contact with others until you get your results (4-5 days)  - Avoid travel on public transportation if possible (such as bus, train, or airplane) or o Sent to the Emergency Department by EMS for evaluation, COVID-19 testing, and possible admission depending on your condition and test results.  What to do if you are LOW RISK for COVID-19?  Reduce your risk of any infection by using the same precautions used for avoiding the common cold or flu:  . Wash your hands often with soap and warm water for at least 20 seconds.  If soap and water are not readily available, use an alcohol-based hand sanitizer with at least 60% alcohol.  .   If coughing or sneezing, cover your mouth and nose by coughing or sneezing into the elbow areas of your shirt or coat, into a tissue or into your sleeve (not your hands). . Avoid shaking hands with others and consider head nods or verbal greetings only. . Avoid touching your eyes, nose, or mouth with unwashed hands.  . Avoid close contact with people who are sick. . Avoid places or events with large numbers of people in one location, like  concerts or sporting events. . Carefully consider travel plans you have or are making. . If you are planning any travel outside or inside the [place], visit the CDC's Travelers' Health webpage for the latest health notices. . If you have some symptoms but not all symptoms, continue to monitor at home and seek medical attention if your symptoms worsen. . If you are having a medical emergency, call 911.   ADDITIONAL HEALTHCARE OPTIONS FOR PATIENTS  [facility] / e-Visit: [email or web address]         [facility]: [phone]  [facility]: [phone]                   [facility]: [phone]   

## 2019-08-01 NOTE — Patient Instructions (Signed)

## 2019-08-15 ENCOUNTER — Other Ambulatory Visit: Payer: Self-pay

## 2019-08-15 ENCOUNTER — Ambulatory Visit (INDEPENDENT_AMBULATORY_CARE_PROVIDER_SITE_OTHER): Payer: BC Managed Care – PPO | Admitting: Nurse Practitioner

## 2019-08-15 ENCOUNTER — Encounter: Payer: Self-pay | Admitting: Nurse Practitioner

## 2019-08-15 VITALS — BP 147/79 | HR 89 | Temp 97.8°F | Resp 20 | Ht 65.0 in | Wt 181.0 lb

## 2019-08-15 DIAGNOSIS — Z0000 Encounter for general adult medical examination without abnormal findings: Secondary | ICD-10-CM

## 2019-08-15 NOTE — Patient Instructions (Signed)

## 2019-08-15 NOTE — Progress Notes (Signed)
Subjective:    Patient ID: Judith Thomas, female    DOB: [birth year], 63 y.o.   MRN: [id number]   Chief Complaint: Annual Exam    HPI:  Patient comes in today for CPE with PAP. She currently has no chronic medical problems and is on no chronic meds. She says she is doing well today without complaints. Is finishing chemo treatments for breast cancer and as 3 more treatments.   Outpatient Encounter Medications as of 08/15/2019  Medication Sig  . fluticasone (FLONASE) 50 MCG/ACT nasal spray Place 1 spray into both nostrils 2 (two) times daily.  Marland Kitchen lidocaine-prilocaine (EMLA) cream Apply to affected area once  . loratadine (CLARITIN) 10 MG tablet Take 10 mg by mouth daily.  . Multiple Vitamin (MULTIVITAMIN) tablet Take 1 tablet by mouth daily.      Past Surgical History:  Procedure Laterality Date  . BREAST LUMPECTOMY WITH RADIOACTIVE SEED AND SENTINEL LYMPH NODE BIOPSY Right 03/08/2019   Procedure: RIGHT BREAST RADIOACTIVE SEED LUMPECTOMY X2 AND SENTINEL LYMPH NODE BIOPSY;  Surgeon: Judith Kussmaul, MD;  Location: [facility];  Service: General;  Laterality: Right;  . COLONOSCOPY  03/2010  . none    . PORTACATH PLACEMENT Left 11/07/2018   Procedure: INSERTION PORT-A-CATH WITH ULTRASOUND;  Surgeon: Judith Kussmaul, MD;  Location: [facility];  Service: General;  Laterality: Left;    Family History  Problem Relation Age of Onset  . Liver disease Mother   . Heart disease Mother        afib  . Heart disease Father   . Asthma Father   . Diabetes Father   . Bladder Cancer Father        smoker  . Breast cancer Maternal Aunt   . Heart disease Maternal Grandfather   . Kidney disease Paternal Grandmother   . Heart disease Paternal Grandfather   . Heart disease Other   . Breast cancer Maternal Aunt        mets to bone    New complaints: None today  Social history: Live with her husband. She is retired from [employer], she was the Educational psychologist.  Controlled substance contract: n/a    Review of Systems  Constitutional: Negative for activity change and appetite change.  HENT: Negative.   Eyes: Negative for pain.  Respiratory: Negative for shortness of breath.   Cardiovascular: Negative for chest pain, palpitations and leg swelling.  Gastrointestinal: Negative for abdominal pain.  Endocrine: Negative for polydipsia.  Genitourinary: Negative.   Skin: Negative for rash.  Neurological: Negative for dizziness, weakness and headaches.  Hematological: Does not bruise/bleed easily.  Psychiatric/Behavioral: Negative.   All other systems reviewed and are negative.      Objective:   Physical Exam Vitals signs and nursing note reviewed.  Constitutional:      General: She is not in acute distress.    Appearance: Normal appearance. She is well-developed.  HENT:     Head: Normocephalic.     Nose: Nose normal.  Eyes:     Pupils: Pupils are equal, round, and reactive to light.  Neck:     Musculoskeletal: Normal range of motion and neck supple.     Vascular: No carotid bruit or JVD.  Cardiovascular:     Rate and Rhythm: Normal rate and regular rhythm.     Heart sounds: Normal heart sounds.  Pulmonary:     Effort: Pulmonary effort is normal. No respiratory distress.  Breath sounds: Normal breath sounds. No wheezing or rales.  Chest:     Chest wall: No tenderness.  Abdominal:     General: Bowel sounds are normal. There is no distension or abdominal bruit.     Palpations: Abdomen is soft. There is no hepatomegaly, splenomegaly, mass or pulsatile mass.     Tenderness: There is no abdominal tenderness.  Genitourinary:    General: Normal vulva.     Vagina: No vaginal discharge.     Rectum: Normal.     Comments: no adnexal masses or tenderness Cervix parous and pink- no discharge Musculoskeletal: Normal range of motion.  Lymphadenopathy:     Cervical: No cervical adenopathy.  Skin:    General: Skin is warm and  dry.  Neurological:     Mental Status: She is alert and oriented to person, place, and time.     Deep Tendon Reflexes: Reflexes are normal and symmetric.  Psychiatric:        Behavior: Behavior normal.        Thought Content: Thought content normal.        Judgment: Judgment normal.     BP (!) 147/79   Pulse 89   Temp 97.8 F (36.6 C) (Temporal)   Resp 20   Ht _0  (1.651 m)   Wt 181 lb (82.1 kg)   SpO2 100%   BMI 30.12 kg/m        Assessment & Plan:  Judith Thomas in today with chief complaint of Annual Exam   1. Annual physical exam Low fat diet and eexercise - CMP14+EGFR - Lipid panel - CBC with Differential/Platelet - Thyroid Panel With TSH - PAP age 29-65   [place], FNP

## 2019-08-16 LAB — LIPID PANEL
Chol/HDL Ratio: 2.9 ratio (ref 0.0–4.4)
Cholesterol, Total: 179 mg/dL (ref 100–199)
HDL: 61 mg/dL (ref >39)
LDL Chol Calc (NIH): 99 mg/dL (ref 0–99)
Triglycerides: 107 mg/dL (ref 0–149)
VLDL Cholesterol Cal: 19 mg/dL (ref 5–40)

## 2019-08-16 LAB — CMP14+EGFR
ALT: 22 IU/L (ref 0–32)
AST: 41 IU/L — ABNORMAL HIGH (ref 0–40)
Albumin/Globulin Ratio: 1.2 (ref 1.2–2.2)
Albumin: 4.2 g/dL (ref 3.8–4.8)
Alkaline Phosphatase: 166 IU/L — ABNORMAL HIGH (ref 39–117)
BUN/Creatinine Ratio: 14 (ref 12–28)
BUN: 10 mg/dL (ref 8–27)
Bilirubin Total: 0.7 mg/dL (ref 0.0–1.2)
CO2: 24 mmol/L (ref 20–29)
Calcium: 9.8 mg/dL (ref 8.7–10.3)
Chloride: 97 mmol/L (ref 96–106)
Creatinine, Ser: 0.71 mg/dL (ref 0.57–1.00)
GFR calc Af Amer: 106 mL/min/{1.73_m2} (ref >59)
GFR calc non Af Amer: 92 mL/min/{1.73_m2} (ref >59)
Globulin, Total: 3.5 g/dL (ref 1.5–4.5)
Glucose: 89 mg/dL (ref 65–99)
Potassium: 3.7 mmol/L (ref 3.5–5.2)
Sodium: 140 mmol/L (ref 134–144)
Total Protein: 7.7 g/dL (ref 6.0–8.5)

## 2019-08-16 LAB — CBC WITH DIFFERENTIAL/PLATELET
Basophils Absolute: 0 10*3/uL (ref 0.0–0.2)
Basos: 1 %
EOS (ABSOLUTE): 0.1 10*3/uL (ref 0.0–0.4)
Eos: 2 %
Hematocrit: 36 % (ref 34.0–46.6)
Hemoglobin: 12.2 g/dL (ref 11.1–15.9)
Immature Grans (Abs): 0 10*3/uL (ref 0.0–0.1)
Immature Granulocytes: 0 %
Lymphocytes Absolute: 1.6 10*3/uL (ref 0.7–3.1)
Lymphs: 27 %
MCH: 29 pg (ref 26.6–33.0)
MCHC: 33.9 g/dL (ref 31.5–35.7)
MCV: 86 fL (ref 79–97)
Monocytes Absolute: 0.6 10*3/uL (ref 0.1–0.9)
Monocytes: 11 %
Neutrophils Absolute: 3.4 10*3/uL (ref 1.4–7.0)
Neutrophils: 59 %
Platelets: 156 10*3/uL (ref 150–450)
RBC: 4.21 x10E6/uL (ref 3.77–5.28)
RDW: 15.5 % — ABNORMAL HIGH (ref 11.7–15.4)
WBC: 5.8 10*3/uL (ref 3.4–10.8)

## 2019-08-16 LAB — THYROID PANEL WITH TSH
Free Thyroxine Index: 2 (ref 1.2–4.9)
T3 Uptake Ratio: 27 % (ref 24–39)
T4, Total: 7.5 ug/dL (ref 4.5–12.0)
TSH: 1.5 u[IU]/mL (ref 0.450–4.500)

## 2019-08-19 ENCOUNTER — Encounter: Payer: BLUE CROSS/BLUE SHIELD | Admitting: Nurse Practitioner

## 2019-08-21 NOTE — Progress Notes (Signed)
Patient Care Team: Chevis Pretty, FNP as PCP - General (Nurse Practitioner) Mauro Kaufmann, RN as Oncology Nurse Navigator Rockwell Germany, RN as Oncology Nurse Navigator  DIAGNOSIS:    ICD-10-CM   1. Malignant neoplasm of upper-inner quadrant of right breast in female, estrogen receptor positive ([place])  C50.211    Z17.0     SUMMARY OF ONCOLOGIC HISTORY: Oncology History  Malignant neoplasm of upper-inner quadrant of right breast in female, estrogen receptor positive ([place])  09/26/2018 Initial Diagnosis   Two right breast masses 12:00 to 1 o'clock position 1.3 cm and 1.2 cm, they are 1.5 cm apart.  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 The Hereditary Gene Panel offered by [facility] includes sequencing and/or deletion duplication testing of the following 48 genes: APC, ATM, AXIN2, BARD1, BMPR1A, BRCA1, BRCA2, BRIP1, CDH1, CDK4, CDKN2A (p14ARF), CDKN2A (p16INK4a), CHEK2, CTNNA1, DICER1, EPCAM (Deletion/duplication testing only), GREM1 (promoter region deletion/duplication testing only), KIT, MEN1, MLH1, MSH2, MSH3, MSH6, MUTYH, NBN, NF1, NHTL1, PALB2, PDGFRA, PMS2, POLD1, POLE, PTEN, RAD50, RAD51C, RAD51D, RNF43, SDHB, SDHC, SDHD, SMAD4, SMARCA4. STK11, TP53, TSC1, TSC2, and VHL.  The following genes were evaluated for sequence changes only: SDHA and HOXB13 c.251G>A variant only. The  report date is January 09, 2019.   03/08/2019 Surgery   Right lumpectomy [name]): IDC s/p neoadjuvant treatment, grade 2, 0.7cm, ER+ 100%, PR+ 5%, HER2 equivocal, Ki67 20%, 0/2 right axillary sentinel lymph nodes negative for carcinoma, with clear margins.    03/28/2019 -  Chemotherapy   The patient had ado-trastuzumab emtansine (KADCYLA) 300 mg in sodium chloride 0.9 % 250 mL chemo infusion, 3.6 mg/kg = 300 mg, Intravenous, Once, 7 of 11 cycles Administration: 300 mg (03/28/2019), 300 mg (04/18/2019), 300 mg (06/21/2019), 300 mg (05/09/2019), 300 mg (05/30/2019), 300 mg (07/11/2019), 300 mg (08/01/2019)  for chemotherapy treatment.    03/29/2019 Cancer Staging   Staging form: Breast, AJCC 8th Edition - Pathologic stage from 03/29/2019: No Stage Recommended (ypT1b, pN0, cM0, G2, ER+, PR+, HER2: Equivocal) - Signed by Eppie Gibson, MD on 03/30/2019   04/09/2019 - 05/06/2019 Radiation Therapy   Adjuvant XRT     CHIEF COMPLIANT: Kadcyla maintenance  INTERVAL HISTORY: Judith Thomas is a 63 y.o. with above-mentioned history of right breast cancer treated with neoadjuvant chemotherapy,lumpectomy, and radiation. She is a participant in the [employer] clinical trial. She presents to the clinic todayforKadcyla maintenance.  REVIEW OF SYSTEMS:   Constitutional: Denies fevers, chills or abnormal weight loss Eyes: Denies blurriness of vision Ears, nose, mouth, throat, and face: Denies mucositis or sore throat Respiratory: Denies cough, dyspnea or wheezes Cardiovascular: Denies palpitation, chest discomfort Gastrointestinal: Denies nausea, heartburn or change in bowel habits Skin: Denies abnormal skin rashes Lymphatics: Denies new lymphadenopathy or easy bruising Neurological: Denies numbness, tingling or new weaknesses Behavioral/Psych: Mood is stable, no new changes  Extremities:  No lower extremity edema Breast: denies any pain or lumps or nodules in either breasts All other systems were reviewed with the  patient and are negative.  I have reviewed the past medical history, past surgical history, social history and family history with the patient and they are unchanged from previous note.  ALLERGIES:  is allergic to paclitaxel.  MEDICATIONS:  Current Outpatient Medications  Medication Sig Dispense Refill   fluticasone (FLONASE) 50 MCG/ACT nasal spray Place 1 spray into both nostrils 2 (two) times daily. 16 g 2   lidocaine-prilocaine (EMLA) cream Apply to affected area once 30 g 3   loratadine (CLARITIN) 10 MG tablet Take 10 mg by mouth daily.     Multiple Vitamin (MULTIVITAMIN) tablet Take 1 tablet by mouth daily.      No current facility-administered medications for this visit.     PHYSICAL EXAMINATION: ECOG PERFORMANCE STATUS: 1 - Symptomatic but completely ambulatory  There were no vitals filed for this visit. There were no vitals filed for this visit.  GENERAL: alert, no distress and comfortable SKIN: skin color, texture, turgor are normal, no rashes or significant lesions EYES: normal, Conjunctiva are pink and non-injected, sclera clear OROPHARYNX: no exudate, no erythema and lips, buccal mucosa, and tongue normal  NECK: supple, thyroid normal size, non-tender, without nodularity LYMPH: no palpable lymphadenopathy in the cervical, axillary or inguinal LUNGS: clear to auscultation and percussion with normal breathing effort HEART: regular rate & rhythm and no murmurs and no lower extremity edema ABDOMEN: abdomen soft, non-tender and normal bowel sounds MUSCULOSKELETAL: no cyanosis of digits and no clubbing  NEURO: alert & oriented x 3 with fluent speech, no focal motor/sensory deficits EXTREMITIES: No lower extremity edema  LABORATORY DATA:  I have reviewed the data as listed CMP Latest Ref Rng & Units 08/15/2019 08/01/2019 07/11/2019  Glucose 65 - 99 mg/dL 89 96 102(H)  BUN 8 - 27 mg/dL _0 Creatinine 0.57 - 1.00 mg/dL 0.71 0.74 0.73  Sodium 134 - 144 mmol/L 140  140 139  Potassium 3.5 - 5.2 mmol/L 3.7 4.3 4.2  Chloride 96 - 106 mmol/L 97 104 104  CO2 20 - 29 mmol/L _1 Calcium 8.7 - 10.3 mg/dL 9.8 9.8 9.5  Total Protein 6.0 - 8.5 g/dL 7.7 7.8 7.6  Total Bilirubin 0.0 - 1.2 mg/dL 0.7 0.6 0.7  Alkaline Phos 39 - 117 IU/L 166(H) 152(H) 139(H)  AST 0 - 40 IU/L 41(H) 36 37  ALT 0 - 32 IU/L _2 Lab Results  Component Value Date   WBC 5.8 08/15/2019   HGB 12.2 08/15/2019   HCT 36.0 08/15/2019   MCV 86 08/15/2019   PLT 156 08/15/2019   NEUTROABS 3.4 08/15/2019    ASSESSMENT & PLAN:  Malignant neoplasm of upper-inner quadrant of right breast in female, estrogen receptor positive ([place]) 09/27/19:Two right breast masses 12:00 to 1 o'clock position 1.3 cm and 1.2 cm, they are 1.5 cm apart. 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 Neoadjuvant chemotherapy withTaxol Herceptin weekly x12completed 4/16/2020followed by Herceptin maintenance vsKadcylafor 1 year 2.03/08/2019: Right lumpectomy IDC s/p neoadjuvant treatment, grade 2, 0.7cm, ER+ 100%, PR+ 5%, HER2 equivocal, Ki67 20%, 0/2 right axillary sentinel lymph nodes negative for carcinoma, with  clear margins. 3. Followed by adjuvant radiation therapy completed 05/06/2019 4.Followed by antiestrogen therapy ------------------------------------------------------------------------------------------------------------------------------------------------------------ Treatment plan:Kadcyla maintenance therapy started 6/18/2020to be completed 10/24/2019 Adjuvant radiation therapy:Started 04/09/2019 completed 05/06/2019 Kadcyla toxicities:Denies any side effects from Kadcyla.  Labs reviewed. Denies neuropathy Muscle pains in the arm and leg probably Kadcyla related She is discussing about traveling to  [place] for a brief vacation to be with her sister.  Return to clinic every 3 weeks for [facility] every 6 weeks for follow-up with me. Once she finishes Kadcyla then we will start her on antiestrogen therapy.  We discussed differences between tamoxifen and aromatase inhibitors.  We will make a final decision when I see her in December.    No orders of the defined types were placed in this encounter.  The patient has a good understanding of the overall plan. she agrees with it. she will call with any problems that may develop before the next visit here.  Nicholas Lose, MD 08/22/2019  Julious Oka Dorshimer am acting as scribe for Dr. Nicholas Lose.  I have reviewed the above documentation for accuracy and completeness, and I agree with the above.

## 2019-08-22 ENCOUNTER — Inpatient Hospital Stay: Payer: BC Managed Care – PPO

## 2019-08-22 ENCOUNTER — Inpatient Hospital Stay: Payer: BC Managed Care – PPO | Attending: Hematology and Oncology

## 2019-08-22 ENCOUNTER — Other Ambulatory Visit: Payer: Self-pay

## 2019-08-22 ENCOUNTER — Inpatient Hospital Stay (HOSPITAL_BASED_OUTPATIENT_CLINIC_OR_DEPARTMENT_OTHER): Payer: BC Managed Care – PPO | Admitting: Hematology and Oncology

## 2019-08-22 DIAGNOSIS — C50211 Malignant neoplasm of upper-inner quadrant of right female breast: Secondary | ICD-10-CM | POA: Diagnosis not present

## 2019-08-22 DIAGNOSIS — Z170 Estrogen receptor positive status [ER+]: Secondary | ICD-10-CM

## 2019-08-22 DIAGNOSIS — Z5112 Encounter for antineoplastic immunotherapy: Secondary | ICD-10-CM | POA: Diagnosis not present

## 2019-08-22 DIAGNOSIS — Z95828 Presence of other vascular implants and grafts: Secondary | ICD-10-CM

## 2019-08-22 LAB — CBC WITH DIFFERENTIAL (CANCER CENTER ONLY)
Abs Immature Granulocytes: 0.01 10*3/uL (ref 0.00–0.07)
Basophils Absolute: 0 10*3/uL (ref 0.0–0.1)
Basophils Relative: 1 %
Eosinophils Absolute: 0.2 10*3/uL (ref 0.0–0.5)
Eosinophils Relative: 3 %
HCT: 38.6 % (ref 36.0–46.0)
Hemoglobin: 12.1 g/dL (ref 12.0–15.0)
Immature Granulocytes: 0 %
Lymphocytes Relative: 21 %
Lymphs Abs: 1.1 10*3/uL (ref 0.7–4.0)
MCH: 28.6 pg (ref 26.0–34.0)
MCHC: 31.3 g/dL (ref 30.0–36.0)
MCV: 91.3 fL (ref 80.0–100.0)
Monocytes Absolute: 0.4 10*3/uL (ref 0.1–1.0)
Monocytes Relative: 8 %
Neutro Abs: 3.6 10*3/uL (ref 1.7–7.7)
Neutrophils Relative %: 67 %
Platelet Count: 219 10*3/uL (ref 150–400)
RBC: 4.23 MIL/uL (ref 3.87–5.11)
RDW: 15.9 % — ABNORMAL HIGH (ref 11.5–15.5)
WBC Count: 5.2 10*3/uL (ref 4.0–10.5)
nRBC: 0 % (ref 0.0–0.2)

## 2019-08-22 LAB — IGP, APTIMA HPV, RFX 16/18,45: HPV Aptima: NEGATIVE

## 2019-08-22 LAB — CMP (CANCER CENTER ONLY)
ALT: 20 U/L (ref 0–44)
AST: 32 U/L (ref 15–41)
Albumin: 3.6 g/dL (ref 3.5–5.0)
Alkaline Phosphatase: 146 U/L — ABNORMAL HIGH (ref 38–126)
Anion gap: 9 (ref 5–15)
BUN: 13 mg/dL (ref 8–23)
CO2: 26 mmol/L (ref 22–32)
Calcium: 9.5 mg/dL (ref 8.9–10.3)
Chloride: 105 mmol/L (ref 98–111)
Creatinine: 0.72 mg/dL (ref 0.44–1.00)
GFR, Est AFR Am: >60 mL/min (ref >60)
GFR, Estimated: >60 mL/min (ref >60)
Glucose, Bld: 93 mg/dL (ref 70–99)
Potassium: 4.2 mmol/L (ref 3.5–5.1)
Sodium: 140 mmol/L (ref 135–145)
Total Bilirubin: 0.7 mg/dL (ref 0.3–1.2)
Total Protein: 7.9 g/dL (ref 6.5–8.1)

## 2019-08-22 MED ORDER — HEPARIN SOD (PORK) LOCK FLUSH 100 UNIT/ML IV SOLN
500.0000 [IU] | Freq: Once | INTRAVENOUS | Status: AC | PRN
  Administered 2019-08-22: 500 [IU]
  Filled 2019-08-22: qty 5

## 2019-08-22 MED ORDER — SODIUM CHLORIDE 0.9% FLUSH
10.0000 mL | Freq: Once | INTRAVENOUS | Status: AC
  Administered 2019-08-22: 10 mL
  Filled 2019-08-22: qty 10

## 2019-08-22 MED ORDER — SODIUM CHLORIDE 0.9% FLUSH
10.0000 mL | INTRAVENOUS | Status: DC | PRN
  Administered 2019-08-22: 10 mL
  Filled 2019-08-22: qty 10

## 2019-08-22 MED ORDER — SODIUM CHLORIDE 0.9 % IV SOLN
3.6000 mg/kg | Freq: Once | INTRAVENOUS | Status: AC
  Administered 2019-08-22: 300 mg via INTRAVENOUS
  Filled 2019-08-22: qty 15

## 2019-08-22 MED ORDER — SODIUM CHLORIDE 0.9 % IV SOLN
Freq: Once | INTRAVENOUS | Status: AC
  Administered 2019-08-22: 09:00:00 via INTRAVENOUS
  Filled 2019-08-22: qty 250

## 2019-08-22 MED ORDER — DIPHENHYDRAMINE HCL 25 MG PO CAPS
ORAL_CAPSULE | ORAL | Status: AC
  Filled 2019-08-22: qty 2

## 2019-08-22 MED ORDER — DIPHENHYDRAMINE HCL 25 MG PO CAPS
50.0000 mg | ORAL_CAPSULE | Freq: Once | ORAL | Status: AC
  Administered 2019-08-22: 50 mg via ORAL

## 2019-08-22 MED ORDER — ACETAMINOPHEN 325 MG PO TABS
650.0000 mg | ORAL_TABLET | Freq: Once | ORAL | Status: AC
  Administered 2019-08-22: 650 mg via ORAL

## 2019-08-22 MED ORDER — ACETAMINOPHEN 325 MG PO TABS
ORAL_TABLET | ORAL | Status: AC
  Filled 2019-08-22: qty 2

## 2019-08-22 NOTE — Patient Instructions (Signed)
[facility] Discharge Instructions for Patients Receiving Chemotherapy  Today you received the following chemotherapy agents Kadcyla  To help prevent nausea and vomiting after your treatment, we encourage you to take your nausea medication as directed   If you develop nausea and vomiting that is not controlled by your nausea medication, call the clinic.   BELOW ARE SYMPTOMS THAT SHOULD BE REPORTED IMMEDIATELY:  *FEVER GREATER THAN 100.5 F  *CHILLS WITH OR WITHOUT FEVER  NAUSEA AND VOMITING THAT IS NOT CONTROLLED WITH YOUR NAUSEA MEDICATION  *UNUSUAL SHORTNESS OF BREATH  *UNUSUAL BRUISING OR BLEEDING  TENDERNESS IN MOUTH AND THROAT WITH OR WITHOUT PRESENCE OF ULCERS  *URINARY PROBLEMS  *BOWEL PROBLEMS  UNUSUAL RASH Items with * indicate a potential emergency and should be followed up as soon as possible.  Feel free to call the clinic should you have any questions or concerns. The clinic phone number is [phone].  Please show the CHEMO ALERT CARD at check-in to the Emergency Department and triage nurse.   

## 2019-08-22 NOTE — Assessment & Plan Note (Signed)
09/27/19:Two right breast masses 12:00 to 1 o'clock position 1.3 cm and 1.2 cm, they are 1.5 cm apart. 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 Neoadjuvant chemotherapy withTaxol Herceptin weekly x12completed 4/16/2020followed by Herceptin maintenance vsKadcylafor 1 year 2.03/08/2019: Right lumpectomy IDC s/p neoadjuvant treatment, grade 2, 0.7cm, ER+ 100%, PR+ 5%, HER2 equivocal, Ki67 20%, 0/2 right axillary sentinel lymph nodes negative for carcinoma, with clear margins. 3. Followed by adjuvant radiation therapy 4.Followed by antiestrogen therapy ------------------------------------------------------------------------------------------------------------------------------------------------------------ Treatment plan:Kadcyla maintenance therapy started 6/18/2020to be completed 10/24/2019 Adjuvant radiation therapy:Started 04/09/2019 completed 05/06/2019 Kadcyla toxicities:Denies any side effects from Kadcyla.  Labs reviewed. Denies neuropathy Muscle pains in the arm and leg probably Kadcyla related She is discussing about traveling to [place] for a brief vacation to be with her sister.  Return to clinic every 3 weeks for [facility] every 6 weeks for follow-up with me.

## 2019-09-12 ENCOUNTER — Inpatient Hospital Stay: Payer: BC Managed Care – PPO

## 2019-09-12 ENCOUNTER — Inpatient Hospital Stay: Payer: BC Managed Care – PPO | Attending: Hematology and Oncology

## 2019-09-12 ENCOUNTER — Other Ambulatory Visit: Payer: Self-pay

## 2019-09-12 VITALS — BP 143/85 | HR 73 | Temp 98.2°F | Resp 16

## 2019-09-12 DIAGNOSIS — C50211 Malignant neoplasm of upper-inner quadrant of right female breast: Secondary | ICD-10-CM | POA: Insufficient documentation

## 2019-09-12 DIAGNOSIS — Z170 Estrogen receptor positive status [ER+]: Secondary | ICD-10-CM | POA: Diagnosis not present

## 2019-09-12 DIAGNOSIS — Z95828 Presence of other vascular implants and grafts: Secondary | ICD-10-CM

## 2019-09-12 DIAGNOSIS — Z5112 Encounter for antineoplastic immunotherapy: Secondary | ICD-10-CM | POA: Insufficient documentation

## 2019-09-12 LAB — CMP (CANCER CENTER ONLY)
ALT: 28 U/L (ref 0–44)
AST: 41 U/L (ref 15–41)
Albumin: 3.7 g/dL (ref 3.5–5.0)
Alkaline Phosphatase: 155 U/L — ABNORMAL HIGH (ref 38–126)
Anion gap: 8 (ref 5–15)
BUN: 14 mg/dL (ref 8–23)
CO2: 26 mmol/L (ref 22–32)
Calcium: 9.6 mg/dL (ref 8.9–10.3)
Chloride: 106 mmol/L (ref 98–111)
Creatinine: 0.76 mg/dL (ref 0.44–1.00)
GFR, Est AFR Am: >60 mL/min (ref >60)
GFR, Estimated: >60 mL/min (ref >60)
Glucose, Bld: 91 mg/dL (ref 70–99)
Potassium: 4.3 mmol/L (ref 3.5–5.1)
Sodium: 140 mmol/L (ref 135–145)
Total Bilirubin: 0.7 mg/dL (ref 0.3–1.2)
Total Protein: 7.9 g/dL (ref 6.5–8.1)

## 2019-09-12 LAB — CBC WITH DIFFERENTIAL (CANCER CENTER ONLY)
Abs Immature Granulocytes: 0.01 10*3/uL (ref 0.00–0.07)
Basophils Absolute: 0 10*3/uL (ref 0.0–0.1)
Basophils Relative: 1 %
Eosinophils Absolute: 0.2 10*3/uL (ref 0.0–0.5)
Eosinophils Relative: 3 %
HCT: 37.7 % (ref 36.0–46.0)
Hemoglobin: 12 g/dL (ref 12.0–15.0)
Immature Granulocytes: 0 %
Lymphocytes Relative: 21 %
Lymphs Abs: 1.1 10*3/uL (ref 0.7–4.0)
MCH: 28.8 pg (ref 26.0–34.0)
MCHC: 31.8 g/dL (ref 30.0–36.0)
MCV: 90.4 fL (ref 80.0–100.0)
Monocytes Absolute: 0.4 10*3/uL (ref 0.1–1.0)
Monocytes Relative: 8 %
Neutro Abs: 3.4 10*3/uL (ref 1.7–7.7)
Neutrophils Relative %: 67 %
Platelet Count: 233 10*3/uL (ref 150–400)
RBC: 4.17 MIL/uL (ref 3.87–5.11)
RDW: 16.3 % — ABNORMAL HIGH (ref 11.5–15.5)
WBC Count: 5.1 10*3/uL (ref 4.0–10.5)
nRBC: 0 % (ref 0.0–0.2)

## 2019-09-12 MED ORDER — SODIUM CHLORIDE 0.9 % IV SOLN
3.6000 mg/kg | Freq: Once | INTRAVENOUS | Status: AC
  Administered 2019-09-12: 300 mg via INTRAVENOUS
  Filled 2019-09-12: qty 15

## 2019-09-12 MED ORDER — SODIUM CHLORIDE 0.9 % IV SOLN
Freq: Once | INTRAVENOUS | Status: AC
  Administered 2019-09-12: 09:00:00 via INTRAVENOUS
  Filled 2019-09-12: qty 250

## 2019-09-12 MED ORDER — SODIUM CHLORIDE 0.9% FLUSH
10.0000 mL | INTRAVENOUS | Status: DC | PRN
  Administered 2019-09-12: 10 mL
  Filled 2019-09-12: qty 10

## 2019-09-12 MED ORDER — ACETAMINOPHEN 325 MG PO TABS
650.0000 mg | ORAL_TABLET | Freq: Once | ORAL | Status: AC
  Administered 2019-09-12: 650 mg via ORAL

## 2019-09-12 MED ORDER — DIPHENHYDRAMINE HCL 25 MG PO CAPS
ORAL_CAPSULE | ORAL | Status: AC
  Filled 2019-09-12: qty 2

## 2019-09-12 MED ORDER — HEPARIN SOD (PORK) LOCK FLUSH 100 UNIT/ML IV SOLN
500.0000 [IU] | Freq: Once | INTRAVENOUS | Status: AC | PRN
  Administered 2019-09-12: 500 [IU]
  Filled 2019-09-12: qty 5

## 2019-09-12 MED ORDER — ACETAMINOPHEN 325 MG PO TABS
ORAL_TABLET | ORAL | Status: AC
  Filled 2019-09-12: qty 2

## 2019-09-12 MED ORDER — DIPHENHYDRAMINE HCL 25 MG PO CAPS
50.0000 mg | ORAL_CAPSULE | Freq: Once | ORAL | Status: AC
  Administered 2019-09-12: 50 mg via ORAL

## 2019-09-12 MED ORDER — SODIUM CHLORIDE 0.9% FLUSH
10.0000 mL | Freq: Once | INTRAVENOUS | Status: AC
  Administered 2019-09-12: 10 mL
  Filled 2019-09-12: qty 10

## 2019-09-12 NOTE — Patient Instructions (Signed)
[facility]cer Center Discharge Instructions for Patients Receiving Chemotherapy  Today you received the following chemotherapy agents Kadcyla  To help prevent nausea and vomiting after your treatment, we encourage you to take your nausea medication as directed   If you develop nausea and vomiting that is not controlled by your nausea medication, call the clinic.   BELOW ARE SYMPTOMS THAT SHOULD BE REPORTED IMMEDIATELY:  *FEVER GREATER THAN 100.5 F  *CHILLS WITH OR WITHOUT FEVER  NAUSEA AND VOMITING THAT IS NOT CONTROLLED WITH YOUR NAUSEA MEDICATION  *UNUSUAL SHORTNESS OF BREATH  *UNUSUAL BRUISING OR BLEEDING  TENDERNESS IN MOUTH AND THROAT WITH OR WITHOUT PRESENCE OF ULCERS  *URINARY PROBLEMS  *BOWEL PROBLEMS  UNUSUAL RASH Items with * indicate a potential emergency and should be followed up as soon as possible.  Feel free to call the clinic should you have any questions or concerns. The clinic phone number is [phone].  Please show the CHEMO ALERT CARD at check-in to the Emergency Department and triage nurse.   

## 2019-09-13 DIAGNOSIS — Z1283 Encounter for screening for malignant neoplasm of skin: Secondary | ICD-10-CM | POA: Diagnosis not present

## 2019-09-13 DIAGNOSIS — D225 Melanocytic nevi of trunk: Secondary | ICD-10-CM | POA: Diagnosis not present

## 2019-09-19 ENCOUNTER — Encounter: Payer: Self-pay | Admitting: Hematology and Oncology

## 2019-09-25 ENCOUNTER — Other Ambulatory Visit: Payer: Self-pay

## 2019-09-25 ENCOUNTER — Ambulatory Visit (INDEPENDENT_AMBULATORY_CARE_PROVIDER_SITE_OTHER): Payer: BC Managed Care – PPO | Admitting: Family Medicine

## 2019-09-25 ENCOUNTER — Encounter: Payer: Self-pay | Admitting: Family Medicine

## 2019-09-25 VITALS — BP 130/75 | HR 94 | Temp 98.6°F | Resp 20 | Ht 65.0 in | Wt 179.0 lb

## 2019-09-25 DIAGNOSIS — N3001 Acute cystitis with hematuria: Secondary | ICD-10-CM | POA: Diagnosis not present

## 2019-09-25 DIAGNOSIS — R399 Unspecified symptoms and signs involving the genitourinary system: Secondary | ICD-10-CM | POA: Diagnosis not present

## 2019-09-25 LAB — URINALYSIS, COMPLETE
Bilirubin, UA: NEGATIVE
Glucose, UA: NEGATIVE
Ketones, UA: NEGATIVE
Nitrite, UA: NEGATIVE
Protein,UA: NEGATIVE
Specific Gravity, UA: 1.01 (ref 1.005–1.030)
Urobilinogen, Ur: 0.2 mg/dL (ref 0.2–1.0)
pH, UA: 6.5 (ref 5.0–7.5)

## 2019-09-25 LAB — MICROSCOPIC EXAMINATION
Renal Epithel, UA: NONE SEEN /hpf
WBC, UA: >30 /hpf — AB (ref 0–5)

## 2019-09-25 MED ORDER — CIPROFLOXACIN HCL 500 MG PO TABS: 500.0000 mg | ORAL_TABLET | Freq: Two times a day (BID) | ORAL | 0 refills | Status: DC

## 2019-09-25 NOTE — Patient Instructions (Signed)

## 2019-09-25 NOTE — Progress Notes (Signed)
[id number], [name], FNP Chief Complaint  Patient presents with  . Urinary Tract Infection    Current Issues:  Presents with 1 days of dysuria, urinary urgency and urinary frequency Associated symptoms include:  cloudy urine, dysuria, lower abdominal pain, urinary frequency and urinary urgency  There is a previous history of of similar symptoms. Sexually active:  No   No concern for STI.  Prior to Admission medications   Medication Sig Start Date End Date Taking? Authorizing Provider  lidocaine-prilocaine (EMLA) cream Apply to affected area once 03/13/19  Yes Nicholas Lose, MD  loratadine (CLARITIN) 10 MG tablet Take 10 mg by mouth daily.   Yes [provider]  Multiple Vitamin (MULTIVITAMIN) tablet Take 1 tablet by mouth daily.  10/10/08  Yes [provider]  ciprofloxacin (CIPRO) 500 MG tablet Take 1 tablet (500 mg total) by mouth 2 (two) times daily. 09/25/19   Baruch Gouty, FNP  fluticasone (FLONASE) 50 MCG/ACT nasal spray Place 1 spray into both nostrils 2 (two) times daily. Patient not taking: Reported on 09/25/2019 02/11/19   Harle Stanford., PA-C    Review of Systems: Review of Systems  Constitutional: Negative for chills, diaphoresis, fever, malaise/fatigue and weight loss.  Respiratory: Negative for cough and shortness of breath.   Cardiovascular: Negative for chest pain and palpitations.  Gastrointestinal: Positive for abdominal pain (lower abdominal pressure). Negative for nausea and vomiting.  Genitourinary: Positive for dysuria, frequency and urgency. Negative for flank pain and hematuria.  Musculoskeletal: Negative for back pain and myalgias.  Skin: Negative for rash.  Neurological: Negative for weakness.  All other systems reviewed and are negative.    PE:  BP 130/75   Pulse 94   Temp 98.6 F (37 C)   Resp 20   Ht 5\' 5"  (1.651 m)   Wt 179 lb (81.2 kg)   SpO2 98%   BMI 29.79 kg/m  Physical Exam  Constitutional: She is oriented to  person, place, and time and well-developed, well-nourished, and in no distress. Vital signs are normal. No distress.  HENT:  Head: Normocephalic and atraumatic.  Eyes: Pupils are equal, round, and reactive to light. Conjunctivae are normal.  Cardiovascular: Normal rate, regular rhythm and normal heart sounds.  No murmur heard. Pulmonary/Chest: Effort normal and breath sounds normal. No respiratory distress.  Abdominal: Soft. Normal appearance and bowel sounds are normal. She exhibits no distension and no mass. There is no hepatosplenomegaly. There is no abdominal tenderness. There is no rebound, no guarding and no CVA tenderness.  Musculoskeletal:        General: Normal range of motion.     Cervical back: Neck supple.  Neurological: She is alert and oriented to person, place, and time.  Skin: Skin is warm and dry. She is not diaphoretic.  Psychiatric: Mood, memory and affect normal.     Results for orders placed or performed in visit on 09/12/19  CMP ([place] only)  Result Value Ref Range   Sodium 140 135 - 145 mmol/L   Potassium 4.3 3.5 - 5.1 mmol/L   Chloride 106 98 - 111 mmol/L   CO2 26 22 - 32 mmol/L   Glucose, Bld 91 70 - 99 mg/dL   BUN 14 8 - 23 mg/dL   Creatinine 0.76 0.44 - 1.00 mg/dL   Calcium 9.6 8.9 - 10.3 mg/dL   Total Protein 7.9 6.5 - 8.1 g/dL   Albumin 3.7 3.5 - 5.0 g/dL   AST 41 15 - 41 U/L  ALT 28 0 - 44 U/L   Alkaline Phosphatase 155 (H) 38 - 126 U/L   Total Bilirubin 0.7 0.3 - 1.2 mg/dL   GFR, Est Non Af Am >60 >60 mL/min   GFR, Est AFR Am >60 >60 mL/min   Anion gap 8 5 - 15  CBC with Differential (Cancer Center Only)  Result Value Ref Range   WBC Count 5.1 4.0 - 10.5 K/uL   RBC 4.17 3.87 - 5.11 MIL/uL   Hemoglobin 12.0 12.0 - 15.0 g/dL   HCT 37.7 36.0 - 46.0 %   MCV 90.4 80.0 - 100.0 fL   MCH 28.8 26.0 - 34.0 pg   MCHC 31.8 30.0 - 36.0 g/dL   RDW 16.3 (H) 11.5 - 15.5 %   Platelet Count 233 150 - 400 K/uL   nRBC 0.0 0.0 - 0.2 %   Neutrophils  Relative % 67 %   Neutro Abs 3.4 1.7 - 7.7 K/uL   Lymphocytes Relative 21 %   Lymphs Abs 1.1 0.7 - 4.0 K/uL   Monocytes Relative 8 %   Monocytes Absolute 0.4 0.1 - 1.0 K/uL   Eosinophils Relative 3 %   Eosinophils Absolute 0.2 0.0 - 0.5 K/uL   Basophils Relative 1 %   Basophils Absolute 0.0 0.0 - 0.1 K/uL   Immature Granulocytes 0 %   Abs Immature Granulocytes 0.01 0.00 - 0.07 K/uL    Urinalysis: few bacteria, 1+ leukocytes, 2+ blood, 0-10 epithelial cells, negative nitrites.   Assessment and Plan:   Judith Thomas was seen today for urinary tract infection.  Diagnoses and all orders for this visit:  UTI symptoms -     urinalysis- dip and micro -     Urine culture  Acute cystitis with hematuria Culture pending. Will change antibiotic therapy if warranted. Antibiotic choice based on previous urine culture. Medications as prescribed. Increase water intake, avoid bladder irritants such as caffeine. Report any new, worsening, or persistent symptoms.  -     ciprofloxacin (CIPRO) 500 MG tablet; Take 1 tablet (500 mg total) by mouth 2 (two) times daily.   The above assessment and management plan was discussed with the patient. The patient verbalized understanding of and has agreed to the management plan. [name] aware to call the clinic if they develop any new symptoms or if symptoms fail to improve or worsen. [name] aware when to return to the clinic for a follow-up visit. Patient educated on when it is appropriate to go to the emergency department.   Return if symptoms worsen or fail to improve.  Monia Pouch, FNP-C [facility] [address] [place], [place] [address] [phone]

## 2019-09-27 ENCOUNTER — Encounter: Payer: Self-pay | Admitting: Hematology and Oncology

## 2019-09-27 LAB — URINE CULTURE

## 2019-09-27 NOTE — Progress Notes (Signed)
Urine culture was positive for staph lugdunensis which is covered by cipro that was prescribed. Complete full antibiotic regimen.

## 2019-10-01 ENCOUNTER — Encounter: Payer: Self-pay | Admitting: Hematology and Oncology

## 2019-10-01 DIAGNOSIS — N6331 Unspecified lump in axillary tail of the right breast: Secondary | ICD-10-CM | POA: Diagnosis not present

## 2019-10-01 DIAGNOSIS — Z853 Personal history of malignant neoplasm of breast: Secondary | ICD-10-CM | POA: Diagnosis not present

## 2019-10-01 NOTE — Progress Notes (Signed)
Patient Care Team: Judith Pretty, FNP as PCP - General (Nurse Practitioner) Judith Kaufmann, RN as Oncology Nurse Navigator Judith Germany, RN as Oncology Nurse Navigator  DIAGNOSIS:    ICD-10-CM   1. Malignant neoplasm of upper-inner quadrant of right breast in female, estrogen receptor positive ([place])  C50.211    Z17.0     SUMMARY OF ONCOLOGIC HISTORY: Oncology History  Malignant neoplasm of upper-inner quadrant of right breast in female, estrogen receptor positive ([place])  09/26/2018 Initial Diagnosis   Two right breast masses 12:00 to 1 o'clock position 1.3 cm and 1.2 cm, they are 1.5 cm apart.  Biopsy revealed grade 2 invasive ductal carcinoma ER 70%- 100%, PR 0% -20%, Ki-67 20%, HER-2 3+ by IHC and FISH ratio 2.15 with a gene copy number of 4.2 for the tumor that was 2+ by IHC, T1c N0 stage Ia   10/24/2018 Cancer Staging   Staging form: Breast, AJCC 8th Edition - Clinical stage from 10/24/2018: Stage IA (cT1c(2), cN0, cM0, G2, ER+, PR+, HER2+) - Signed by Judith Lose, MD on 10/24/2018   11/08/2018 -  Neo-Adjuvant Chemotherapy   Neoadjuvant chemotherapy with Taxol Herceptin weekly x12 followed by Herceptin maintenance vs Kadcyla for 1 year   01/09/2019 Genetic Testing   Negative genetic testing on the common hereditary cancer panel. The Hereditary Gene Panel offered by [facility] includes sequencing and/or deletion duplication testing of the following 48 genes: APC, ATM, AXIN2, BARD1, BMPR1A, BRCA1, BRCA2, BRIP1, CDH1, CDK4, CDKN2A (p14ARF), CDKN2A (p16INK4a), CHEK2, CTNNA1, DICER1, EPCAM (Deletion/duplication testing only), GREM1 (promoter region deletion/duplication testing only), KIT, MEN1, MLH1, MSH2, MSH3, MSH6, MUTYH, NBN, NF1, NHTL1, PALB2, PDGFRA, PMS2, POLD1, POLE, PTEN, RAD50, RAD51C, RAD51D, RNF43, SDHB, SDHC, SDHD, SMAD4, SMARCA4. STK11, TP53, TSC1, TSC2, and VHL.  The following genes were evaluated for sequence changes only: SDHA and HOXB13 c.251G>A variant only. The  report date is January 09, 2019.   03/08/2019 Surgery   Right lumpectomy Judith Thomas): IDC s/p neoadjuvant treatment, grade 2, 0.7cm, ER+ 100%, PR+ 5%, HER2 equivocal, Ki67 20%, 0/2 right axillary sentinel lymph nodes negative for carcinoma, with clear margins.    03/28/2019 -  Chemotherapy   The patient had ado-trastuzumab emtansine (KADCYLA) 300 mg in sodium chloride 0.9 % 250 mL chemo infusion, 3.6 mg/kg = 300 mg, Intravenous, Once, 9 of 11 cycles Administration: 300 mg (03/28/2019), 300 mg (04/18/2019), 300 mg (06/21/2019), 300 mg (05/09/2019), 300 mg (05/30/2019), 300 mg (07/11/2019), 300 mg (08/01/2019), 300 mg (08/22/2019), 300 mg (09/12/2019)  for chemotherapy treatment.    03/29/2019 Cancer Staging   Staging form: Breast, AJCC 8th Edition - Pathologic stage from 03/29/2019: No Stage Recommended (ypT1b, pN0, cM0, G2, ER+, PR+, HER2: Equivocal) - Signed by Judith Gibson, MD on 03/30/2019   04/09/2019 - 05/06/2019 Radiation Therapy   Adjuvant XRT     CHIEF COMPLIANT: Kadcyla maintenance  INTERVAL HISTORY: Judith Thomas is a 63 y.o. with above-mentioned history of right breast cancer treated with neoadjuvant chemotherapy,lumpectomy, and radiation. She is a participant in the UpBeat clinical trial.She presents to the clinic todayforKadcyla maintenance and to discuss anti-estrogen therapy.   REVIEW OF SYSTEMS:   Constitutional: Denies fevers, chills or abnormal weight loss Eyes: Denies blurriness of vision Ears, nose, mouth, throat, and face: Denies mucositis or sore throat Respiratory: Denies cough, dyspnea or wheezes Cardiovascular: Denies palpitation, chest discomfort Gastrointestinal: Denies nausea, heartburn or change in bowel habits Skin: Denies abnormal skin rashes Lymphatics: Denies new lymphadenopathy or easy bruising Neurological: Denies numbness, tingling or  new weaknesses Behavioral/Psych: Mood is stable, no new changes  Extremities: No lower extremity edema Breast: denies any pain  or lumps or nodules in either breasts All other systems were reviewed with the patient and are negative.  I have reviewed the past medical history, past surgical history, social history and family history with the patient and they are unchanged from previous note.  ALLERGIES:  is allergic to paclitaxel.  MEDICATIONS:  Current Outpatient Medications  Medication Sig Dispense Refill  . ciprofloxacin (CIPRO) 500 MG tablet Take 1 tablet (500 mg total) by mouth 2 (two) times daily. 14 tablet 0  . fluticasone (FLONASE) 50 MCG/ACT nasal spray Place 1 spray into both nostrils 2 (two) times daily. (Patient not taking: Reported on 09/25/2019) 16 g 2  . lidocaine-prilocaine (EMLA) cream Apply to affected area once 30 g 3  . loratadine (CLARITIN) 10 MG tablet Take 10 mg by mouth daily.    . Multiple Vitamin (MULTIVITAMIN) tablet Take 1 tablet by mouth daily.      No current facility-administered medications for this visit.    PHYSICAL EXAMINATION: ECOG PERFORMANCE STATUS: 1 - Symptomatic but completely ambulatory  Vitals:   10/02/19 0827  BP: 127/82  Pulse: 94  Resp: (!) 80  Temp: 98.5 F (36.9 C)  SpO2: 100%   Filed Weights   10/02/19 0827  Weight: 164 lb 14.4 oz (74.8 kg)    GENERAL: alert, no distress and comfortable SKIN: skin color, texture, turgor are normal, no rashes or significant lesions EYES: normal, Conjunctiva are pink and non-injected, sclera clear OROPHARYNX: no exudate, no erythema and lips, buccal mucosa, and tongue normal  NECK: supple, thyroid normal size, non-tender, without nodularity LYMPH: no palpable lymphadenopathy in the cervical, axillary or inguinal LUNGS: clear to auscultation and percussion with normal breathing effort HEART: regular rate & rhythm and no murmurs and no lower extremity edema ABDOMEN: abdomen soft, non-tender and normal bowel sounds MUSCULOSKELETAL: no cyanosis of digits and no clubbing  NEURO: alert & oriented x 3 with fluent speech, no  focal motor/sensory deficits EXTREMITIES: No lower extremity edema  LABORATORY DATA:  I have reviewed the data as listed CMP Latest Ref Rng & Units 09/12/2019 08/22/2019 08/15/2019  Glucose 70 - 99 mg/dL 91 93 89  BUN 8 - 23 mg/dL _0 Creatinine 0.44 - 1.00 mg/dL 0.76 0.72 0.71  Sodium 135 - 145 mmol/L 140 140 140  Potassium 3.5 - 5.1 mmol/L 4.3 4.2 3.7  Chloride 98 - 111 mmol/L 106 105 97  CO2 22 - 32 mmol/L _1 Calcium 8.9 - 10.3 mg/dL 9.6 9.5 9.8  Total Protein 6.5 - 8.1 g/dL 7.9 7.9 7.7  Total Bilirubin 0.3 - 1.2 mg/dL 0.7 0.7 0.7  Alkaline Phos 38 - 126 U/L 155(H) 146(H) 166(H)  AST 15 - 41 U/L 41 32 41(H)  ALT 0 - 44 U/L _2 Lab Results  Component Value Date   WBC 5.0 10/02/2019   HGB 12.1 10/02/2019   HCT 37.8 10/02/2019   MCV 89.4 10/02/2019   PLT 210 10/02/2019   NEUTROABS 3.4 10/02/2019    ASSESSMENT & PLAN:  Malignant neoplasm of upper-inner quadrant of right breast in female, estrogen receptor positive ([place]) 09/27/19:Two right breast masses 12:00 to 1 o'clock position 1.3 cm and 1.2 cm, they are 1.5 cm apart. Biopsy revealed grade 2 invasive ductal carcinoma ER 70%- 100%, PR 0% -20%, Ki-67 20%, HER-2 3+ by IHC and FISH ratio 2.15  with a gene copy number of 4.2 for the tumor that was 2+ by IHC, T1c N0 stage Ia  Treatment plan: 1. Neoadjuvant chemotherapy withTaxol Herceptin weekly x12completed 4/16/2020followed by Herceptin maintenance vsKadcylafor 1 year 2.03/08/2019: Right lumpectomy IDC s/p neoadjuvant treatment, grade 2, 0.7cm, ER+ 100%, PR+ 5%, HER2 equivocal, Ki67 20%, 0/2 right axillary sentinel lymph nodes negative for carcinoma, with clear margins. 3. Followed by adjuvant radiation therapy completed 05/06/2019 4.Followed by antiestrogen therapy ------------------------------------------------------------------------------------------------------------------------------------------------------------ Treatment plan:Kadcyla  maintenance therapy started 6/18/2020to be completed 10/24/2019 Adjuvant radiation therapy:Started 6/30/2020completed 05/06/2019 Kadcyla toxicities:Denies any side effects from Kadcyla.  Labs reviewed. Denies neuropathy Muscle pains in the arm and leg probably Kadcyla related    She has one more cycle of Kadcyla She will start anastrozole once Kadcyla is done Prescription sent  Return to clinic in 3 months for SCP visit  No orders of the defined types were placed in this encounter.  The patient has a good understanding of the overall plan. she agrees with it. she will call with any problems that may develop before the next visit here.  Judith Lose, MD 10/02/2019  Julious Oka Dorshimer, am acting as scribe for Dr. Nicholas Thomas.  I have reviewed the above document for accuracy and completeness, and I agree with the above.

## 2019-10-02 ENCOUNTER — Inpatient Hospital Stay: Payer: BC Managed Care – PPO

## 2019-10-02 ENCOUNTER — Other Ambulatory Visit: Payer: Self-pay

## 2019-10-02 ENCOUNTER — Inpatient Hospital Stay (HOSPITAL_BASED_OUTPATIENT_CLINIC_OR_DEPARTMENT_OTHER): Payer: BC Managed Care – PPO | Admitting: Hematology and Oncology

## 2019-10-02 VITALS — BP 120/78 | HR 72 | Resp 20

## 2019-10-02 VITALS — BP 127/82 | HR 94 | Temp 98.5°F | Resp 80 | Ht 65.0 in | Wt 164.9 lb

## 2019-10-02 DIAGNOSIS — Z780 Asymptomatic menopausal state: Secondary | ICD-10-CM | POA: Diagnosis not present

## 2019-10-02 DIAGNOSIS — C50211 Malignant neoplasm of upper-inner quadrant of right female breast: Secondary | ICD-10-CM

## 2019-10-02 DIAGNOSIS — Z170 Estrogen receptor positive status [ER+]: Secondary | ICD-10-CM

## 2019-10-02 DIAGNOSIS — Z95828 Presence of other vascular implants and grafts: Secondary | ICD-10-CM

## 2019-10-02 DIAGNOSIS — Z5112 Encounter for antineoplastic immunotherapy: Secondary | ICD-10-CM | POA: Diagnosis not present

## 2019-10-02 LAB — CMP (CANCER CENTER ONLY)
ALT: 30 U/L (ref 0–44)
AST: 45 U/L — ABNORMAL HIGH (ref 15–41)
Albumin: 3.6 g/dL (ref 3.5–5.0)
Alkaline Phosphatase: 153 U/L — ABNORMAL HIGH (ref 38–126)
Anion gap: 9 (ref 5–15)
BUN: 15 mg/dL (ref 8–23)
CO2: 26 mmol/L (ref 22–32)
Calcium: 9 mg/dL (ref 8.9–10.3)
Chloride: 104 mmol/L (ref 98–111)
Creatinine: 0.73 mg/dL (ref 0.44–1.00)
GFR, Est AFR Am: >60 mL/min (ref >60)
GFR, Estimated: >60 mL/min (ref >60)
Glucose, Bld: 96 mg/dL (ref 70–99)
Potassium: 3.8 mmol/L (ref 3.5–5.1)
Sodium: 139 mmol/L (ref 135–145)
Total Bilirubin: 0.9 mg/dL (ref 0.3–1.2)
Total Protein: 7.7 g/dL (ref 6.5–8.1)

## 2019-10-02 LAB — CBC WITH DIFFERENTIAL (CANCER CENTER ONLY)
Abs Immature Granulocytes: 0.01 10*3/uL (ref 0.00–0.07)
Basophils Absolute: 0 10*3/uL (ref 0.0–0.1)
Basophils Relative: 1 %
Eosinophils Absolute: 0.2 10*3/uL (ref 0.0–0.5)
Eosinophils Relative: 3 %
HCT: 37.8 % (ref 36.0–46.0)
Hemoglobin: 12.1 g/dL (ref 12.0–15.0)
Immature Granulocytes: 0 %
Lymphocytes Relative: 20 %
Lymphs Abs: 1 10*3/uL (ref 0.7–4.0)
MCH: 28.6 pg (ref 26.0–34.0)
MCHC: 32 g/dL (ref 30.0–36.0)
MCV: 89.4 fL (ref 80.0–100.0)
Monocytes Absolute: 0.4 10*3/uL (ref 0.1–1.0)
Monocytes Relative: 8 %
Neutro Abs: 3.4 10*3/uL (ref 1.7–7.7)
Neutrophils Relative %: 68 %
Platelet Count: 210 10*3/uL (ref 150–400)
RBC: 4.23 MIL/uL (ref 3.87–5.11)
RDW: 16.3 % — ABNORMAL HIGH (ref 11.5–15.5)
WBC Count: 5 10*3/uL (ref 4.0–10.5)
nRBC: 0 % (ref 0.0–0.2)

## 2019-10-02 MED ORDER — SODIUM CHLORIDE 0.9 % IV SOLN: 3.6000 mg/kg | Freq: Once | INTRAVENOUS | Status: DC

## 2019-10-02 MED ORDER — ANASTROZOLE 1 MG PO TABS: 1.0000 mg | ORAL_TABLET | Freq: Every day | ORAL | 3 refills | Status: DC

## 2019-10-02 MED ORDER — SODIUM CHLORIDE 0.9 % IV SOLN
3.6000 mg/kg | Freq: Once | INTRAVENOUS | Status: AC
  Administered 2019-10-02: 260 mg via INTRAVENOUS
  Filled 2019-10-02: qty 8

## 2019-10-02 MED ORDER — SODIUM CHLORIDE 0.9% FLUSH
10.0000 mL | Freq: Once | INTRAVENOUS | Status: AC
  Administered 2019-10-02: 10 mL
  Filled 2019-10-02: qty 10

## 2019-10-02 MED ORDER — SODIUM CHLORIDE 0.9 % IV SOLN
Freq: Once | INTRAVENOUS | Status: AC
  Filled 2019-10-02: qty 250

## 2019-10-02 MED ORDER — DIPHENHYDRAMINE HCL 25 MG PO CAPS
ORAL_CAPSULE | ORAL | Status: AC
  Filled 2019-10-02: qty 2

## 2019-10-02 MED ORDER — DIPHENHYDRAMINE HCL 25 MG PO CAPS
50.0000 mg | ORAL_CAPSULE | Freq: Once | ORAL | Status: AC
  Administered 2019-10-02: 50 mg via ORAL

## 2019-10-02 MED ORDER — ACETAMINOPHEN 325 MG PO TABS
ORAL_TABLET | ORAL | Status: AC
  Filled 2019-10-02: qty 2

## 2019-10-02 MED ORDER — ACETAMINOPHEN 325 MG PO TABS
650.0000 mg | ORAL_TABLET | Freq: Once | ORAL | Status: AC
Start: 2019-10-02 — End: 2019-10-02
  Administered 2019-10-02: 650 mg via ORAL

## 2019-10-02 MED ORDER — HEPARIN SOD (PORK) LOCK FLUSH 100 UNIT/ML IV SOLN
500.0000 [IU] | Freq: Once | INTRAVENOUS | Status: DC | PRN
  Filled 2019-10-02: qty 5

## 2019-10-02 MED ORDER — SODIUM CHLORIDE 0.9% FLUSH
10.0000 mL | INTRAVENOUS | Status: DC | PRN
  Filled 2019-10-02: qty 10

## 2019-10-02 NOTE — Patient Instructions (Signed)
[facility] Discharge Instructions for Patients Receiving Chemotherapy  Today you received the following chemotherapy agents Kadcyla  To help prevent nausea and vomiting after your treatment, we encourage you to take your nausea medication as directed   If you develop nausea and vomiting that is not controlled by your nausea medication, call the clinic.   BELOW ARE SYMPTOMS THAT SHOULD BE REPORTED IMMEDIATELY:  *FEVER GREATER THAN 100.5 F  *CHILLS WITH OR WITHOUT FEVER  NAUSEA AND VOMITING THAT IS NOT CONTROLLED WITH YOUR NAUSEA MEDICATION  *UNUSUAL SHORTNESS OF BREATH  *UNUSUAL BRUISING OR BLEEDING  TENDERNESS IN MOUTH AND THROAT WITH OR WITHOUT PRESENCE OF ULCERS  *URINARY PROBLEMS  *BOWEL PROBLEMS  UNUSUAL RASH Items with * indicate a potential emergency and should be followed up as soon as possible.  Feel free to call the clinic should you have any questions or concerns. The clinic phone number is [phone].  Please show the CHEMO ALERT CARD at check-in to the Emergency Department and triage nurse.   

## 2019-10-02 NOTE — Progress Notes (Signed)
Per Dr. Lindi Adie, since patient's weight today is outside the 10% margin of previous dose, we will use new updated weight today (74.8 kg) for ado-trastuzumab emtansine (Kadcyla) 3.6 mg/kg. Patient will receive ado-trastuzumab emtansine 260 mg IV today (dose rounded to vial size).  Leron Croak, PharmD, BCPS PGY2 Hematology/Oncology Pharmacy Resident 10/02/2019 10:06 AM

## 2019-10-02 NOTE — Patient Instructions (Signed)

## 2019-10-02 NOTE — Assessment & Plan Note (Signed)
09/27/19:Two right breast masses 12:00 to 1 o'clock position 1.3 cm and 1.2 cm, they are 1.5 cm apart. 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 Neoadjuvant chemotherapy withTaxol Herceptin weekly x12completed 4/16/2020followed by Herceptin maintenance vsKadcylafor 1 year 2.03/08/2019: Right lumpectomy IDC s/p neoadjuvant treatment, grade 2, 0.7cm, ER+ 100%, PR+ 5%, HER2 equivocal, Ki67 20%, 0/2 right axillary sentinel lymph nodes negative for carcinoma, with clear margins. 3. Followed by adjuvant radiation therapy completed 05/06/2019 4.Followed by antiestrogen therapy ------------------------------------------------------------------------------------------------------------------------------------------------------------ Treatment plan:Kadcyla maintenance therapy started 6/18/2020to be completed 10/24/2019 Adjuvant radiation therapy:Started 6/30/2020completed 05/06/2019 Kadcyla toxicities:Denies any side effects from Kadcyla.  Labs reviewed. Denies neuropathy Muscle pains in the arm and leg probably Kadcyla related She is discussing about traveling to [place] for a brief vacation to be with her sister.  She has one more cycle of Kadcyla She will start anastrozole once Kadcyla is done Prescription sent  Return to clinic in 3 months for SCP visit

## 2019-10-03 ENCOUNTER — Ambulatory Visit: Payer: BC Managed Care – PPO | Admitting: Hematology and Oncology

## 2019-10-03 ENCOUNTER — Ambulatory Visit: Payer: BC Managed Care – PPO

## 2019-10-03 ENCOUNTER — Other Ambulatory Visit: Payer: BC Managed Care – PPO

## 2019-10-08 ENCOUNTER — Ambulatory Visit: Payer: Self-pay | Admitting: General Surgery

## 2019-10-14 ENCOUNTER — Encounter: Payer: Self-pay | Admitting: *Deleted

## 2019-10-18 ENCOUNTER — Telehealth: Payer: Self-pay | Admitting: *Deleted

## 2019-10-18 DIAGNOSIS — Z006 Encounter for examination for normal comparison and control in clinical research program: Secondary | ICD-10-CM

## 2019-10-18 NOTE — Telephone Encounter (Signed)
Patient returned call and agreed to come in for her 12 months study visit on 11/20/19 at 9 am.  Reminded patient we will collect the fasting research labs next week on 10/24/19 since it will be her last scheduled lab appointment for several months. Instructed patient to contact research nurse if she needs to change her schedule or for any questions before next visit. She verbalized understanding. Thanked patient for her time and participation. Look forward to meeting her on 11/20/19.  Foye Spurling, BSN, RN Clinical Research Nurse 10/18/2019 3:31 PM

## 2019-10-18 NOTE — Telephone Encounter (Signed)
Upbeat Study;  Called patient to schedule 12 month visit for the Upbeat Study.  Informed patient this visit includes fasting labs, neurocognitive testing, physical functions testing, questionnaires, medication review and VS.  Asked patient if ok to collect research labs next week during lab appointment and if she can fast x 3 hours prior to lab.  Patient said that would not be a problem, but she does prefer to complete the rest of the study activities on a day other than her treatment day.  Patient states she will call research nurse back after she checks her calendar. Thanked patient for her time.  Foye Spurling, BSN, RN Clinical Research Nurse 10/18/2019 2:44 PM

## 2019-10-23 ENCOUNTER — Telehealth: Payer: Self-pay | Admitting: *Deleted

## 2019-10-23 NOTE — Telephone Encounter (Signed)
Called patient to reminder her of lab appointment scheduled 1/14 at 8:00  and fasting 3  hours prior to lab draw.

## 2019-10-24 ENCOUNTER — Inpatient Hospital Stay: Payer: BC Managed Care – PPO

## 2019-10-24 ENCOUNTER — Inpatient Hospital Stay: Payer: BC Managed Care – PPO | Attending: Hematology and Oncology

## 2019-10-24 ENCOUNTER — Other Ambulatory Visit: Payer: Self-pay

## 2019-10-24 ENCOUNTER — Encounter: Payer: Self-pay | Admitting: *Deleted

## 2019-10-24 VITALS — BP 138/80 | HR 78 | Temp 98.2°F | Resp 18 | Wt 179.5 lb

## 2019-10-24 DIAGNOSIS — Z170 Estrogen receptor positive status [ER+]: Secondary | ICD-10-CM | POA: Insufficient documentation

## 2019-10-24 DIAGNOSIS — C50211 Malignant neoplasm of upper-inner quadrant of right female breast: Secondary | ICD-10-CM | POA: Insufficient documentation

## 2019-10-24 DIAGNOSIS — Z5112 Encounter for antineoplastic immunotherapy: Secondary | ICD-10-CM | POA: Insufficient documentation

## 2019-10-24 DIAGNOSIS — Z95828 Presence of other vascular implants and grafts: Secondary | ICD-10-CM

## 2019-10-24 DIAGNOSIS — Z006 Encounter for examination for normal comparison and control in clinical research program: Secondary | ICD-10-CM

## 2019-10-24 LAB — CMP (CANCER CENTER ONLY)
ALT: 27 U/L (ref 0–44)
AST: 39 U/L (ref 15–41)
Albumin: 3.6 g/dL (ref 3.5–5.0)
Alkaline Phosphatase: 168 U/L — ABNORMAL HIGH (ref 38–126)
Anion gap: 10 (ref 5–15)
BUN: 13 mg/dL (ref 8–23)
CO2: 25 mmol/L (ref 22–32)
Calcium: 8.9 mg/dL (ref 8.9–10.3)
Chloride: 105 mmol/L (ref 98–111)
Creatinine: 0.7 mg/dL (ref 0.44–1.00)
GFR, Est AFR Am: >60 mL/min (ref >60)
GFR, Estimated: >60 mL/min (ref >60)
Glucose, Bld: 95 mg/dL (ref 70–99)
Potassium: 4.1 mmol/L (ref 3.5–5.1)
Sodium: 140 mmol/L (ref 135–145)
Total Bilirubin: 1 mg/dL (ref 0.3–1.2)
Total Protein: 7.7 g/dL (ref 6.5–8.1)

## 2019-10-24 LAB — CBC WITH DIFFERENTIAL (CANCER CENTER ONLY)
Abs Immature Granulocytes: 0.01 10*3/uL (ref 0.00–0.07)
Basophils Absolute: 0 10*3/uL (ref 0.0–0.1)
Basophils Relative: 1 %
Eosinophils Absolute: 0.1 10*3/uL (ref 0.0–0.5)
Eosinophils Relative: 3 %
HCT: 38.6 % (ref 36.0–46.0)
Hemoglobin: 12.1 g/dL (ref 12.0–15.0)
Immature Granulocytes: 0 %
Lymphocytes Relative: 19 %
Lymphs Abs: 1 10*3/uL (ref 0.7–4.0)
MCH: 28.1 pg (ref 26.0–34.0)
MCHC: 31.3 g/dL (ref 30.0–36.0)
MCV: 89.8 fL (ref 80.0–100.0)
Monocytes Absolute: 0.5 10*3/uL (ref 0.1–1.0)
Monocytes Relative: 9 %
Neutro Abs: 3.8 10*3/uL (ref 1.7–7.7)
Neutrophils Relative %: 68 %
Platelet Count: 235 10*3/uL (ref 150–400)
RBC: 4.3 MIL/uL (ref 3.87–5.11)
RDW: 16.6 % — ABNORMAL HIGH (ref 11.5–15.5)
WBC Count: 5.4 10*3/uL (ref 4.0–10.5)
nRBC: 0 % (ref 0.0–0.2)

## 2019-10-24 LAB — RESEARCH LABS

## 2019-10-24 MED ORDER — HEPARIN SOD (PORK) LOCK FLUSH 100 UNIT/ML IV SOLN
500.0000 [IU] | Freq: Once | INTRAVENOUS | Status: AC | PRN
  Administered 2019-10-24: 500 [IU]
  Filled 2019-10-24: qty 5

## 2019-10-24 MED ORDER — SODIUM CHLORIDE 0.9 % IV SOLN
3.6000 mg/kg | Freq: Once | INTRAVENOUS | Status: AC
  Administered 2019-10-24: 260 mg via INTRAVENOUS
  Filled 2019-10-24: qty 8

## 2019-10-24 MED ORDER — SODIUM CHLORIDE 0.9 % IV SOLN: 3.6000 mg/kg | Freq: Once | INTRAVENOUS | Status: DC

## 2019-10-24 MED ORDER — DIPHENHYDRAMINE HCL 25 MG PO CAPS
ORAL_CAPSULE | ORAL | Status: AC
  Filled 2019-10-24: qty 2

## 2019-10-24 MED ORDER — DIPHENHYDRAMINE HCL 25 MG PO CAPS
50.0000 mg | ORAL_CAPSULE | Freq: Once | ORAL | Status: AC
  Administered 2019-10-24: 50 mg via ORAL

## 2019-10-24 MED ORDER — SODIUM CHLORIDE 0.9 % IV SOLN
Freq: Once | INTRAVENOUS | Status: AC
  Filled 2019-10-24: qty 250

## 2019-10-24 MED ORDER — ACETAMINOPHEN 325 MG PO TABS
ORAL_TABLET | ORAL | Status: AC
  Filled 2019-10-24: qty 2

## 2019-10-24 MED ORDER — SODIUM CHLORIDE 0.9% FLUSH
10.0000 mL | Freq: Once | INTRAVENOUS | Status: AC
  Administered 2019-10-24: 10 mL
  Filled 2019-10-24: qty 10

## 2019-10-24 MED ORDER — SODIUM CHLORIDE 0.9% FLUSH
10.0000 mL | INTRAVENOUS | Status: DC | PRN
  Administered 2019-10-24: 10 mL
  Filled 2019-10-24: qty 10

## 2019-10-24 MED ORDER — ACETAMINOPHEN 325 MG PO TABS
650.0000 mg | ORAL_TABLET | Freq: Once | ORAL | Status: AC
  Administered 2019-10-24: 650 mg via ORAL

## 2019-10-24 NOTE — Patient Instructions (Signed)
[facility] Discharge Instructions for Patients Receiving Chemotherapy  Today you received the following chemotherapy agents: Kadcyla  To help prevent nausea and vomiting after your treatment, we encourage you to take your nausea medication as directed.    If you develop nausea and vomiting that is not controlled by your nausea medication, call the clinic.   BELOW ARE SYMPTOMS THAT SHOULD BE REPORTED IMMEDIATELY:  *FEVER GREATER THAN 100.5 F  *CHILLS WITH OR WITHOUT FEVER  NAUSEA AND VOMITING THAT IS NOT CONTROLLED WITH YOUR NAUSEA MEDICATION  *UNUSUAL SHORTNESS OF BREATH  *UNUSUAL BRUISING OR BLEEDING  TENDERNESS IN MOUTH AND THROAT WITH OR WITHOUT PRESENCE OF ULCERS  *URINARY PROBLEMS  *BOWEL PROBLEMS  UNUSUAL RASH Items with * indicate a potential emergency and should be followed up as soon as possible.  Feel free to call the clinic you have any questions or concerns. The clinic phone number is [phone].  Please show the CHEMO ALERT CARD at check-in to the Emergency Department and triage nurse.   

## 2019-11-13 ENCOUNTER — Encounter: Payer: Self-pay | Admitting: Hematology and Oncology

## 2019-11-20 ENCOUNTER — Other Ambulatory Visit: Payer: Self-pay

## 2019-11-20 ENCOUNTER — Inpatient Hospital Stay: Payer: BC Managed Care – PPO | Attending: Hematology and Oncology | Admitting: *Deleted

## 2019-11-20 VITALS — BP 141/78 | HR 92 | Ht 65.0 in | Wt 176.5 lb

## 2019-11-20 DIAGNOSIS — C50211 Malignant neoplasm of upper-inner quadrant of right female breast: Secondary | ICD-10-CM

## 2019-11-20 DIAGNOSIS — Z170 Estrogen receptor positive status [ER+]: Secondary | ICD-10-CM

## 2019-11-20 NOTE — Research (Signed)
[id number] Upbeat 12 Months Visit:  Patient into clinic by herself this morning to complete activities for Upbeat study.  Labs; Collected on day of lab appointment with last treatment 10/24/19. Patient confirmed she had been fasting for more than 8 hours for that blood draw. Copy of results were reviewed by Dr. Lindi Adie and a copy given to patient today for her records.  VS; Collected per protocol after resting for 5 minutes.  Height and weight without shoes. See VS flowsheet PROs;  Patient completed in clinic on her own. Research nurse collected and checked for completeness and accuracy.  Neurocognitive testing; Completed by Farris Has, research assistant.  Medication review; Reviewed medication list with patient and updated in EMR.  Waist measurement;  41.5 inches, measured per instructions on CRF14, page 1.  Physical Functions testing; Completed per protocol without difficulty and patient tolerated well.  Gift Card; [phone] [employer] gift card given to patient for completing study activities for this time point. Plan;  Thanked patient for her participation in this study. Informed patient next study visit due in one year and will include cardiac MRI. Research nurse will contact patient aproximately 2 months in advance to schedule.  She verbalized understanding.  Foye Spurling, BSN, RN Clinical Research Nurse 11/20/2019

## 2019-11-21 DIAGNOSIS — Z780 Asymptomatic menopausal state: Secondary | ICD-10-CM | POA: Diagnosis not present

## 2019-11-21 DIAGNOSIS — Z13820 Encounter for screening for osteoporosis: Secondary | ICD-10-CM | POA: Diagnosis not present

## 2019-11-25 ENCOUNTER — Encounter: Payer: Self-pay | Admitting: Oncology

## 2019-11-29 ENCOUNTER — Encounter: Payer: Self-pay | Admitting: Hematology and Oncology

## 2019-12-12 ENCOUNTER — Encounter: Payer: Self-pay | Admitting: Hematology and Oncology

## 2019-12-18 ENCOUNTER — Other Ambulatory Visit: Payer: Self-pay

## 2019-12-18 ENCOUNTER — Ambulatory Visit (HOSPITAL_BASED_OUTPATIENT_CLINIC_OR_DEPARTMENT_OTHER)
Admission: RE | Admit: 2019-12-18 | Discharge: 2019-12-18 | Disposition: A | Discharge status: Home / Self Care | Payer: BC Managed Care – PPO | Source: Ambulatory Visit | Attending: Internal Medicine | Admitting: Internal Medicine

## 2019-12-18 ENCOUNTER — Ambulatory Visit (HOSPITAL_COMMUNITY)
Admission: RE | Admit: 2019-12-18 | Discharge: 2019-12-18 | Disposition: A | Discharge status: Home / Self Care | Payer: BC Managed Care – PPO | Source: Ambulatory Visit | Attending: Internal Medicine | Admitting: Internal Medicine

## 2019-12-18 ENCOUNTER — Encounter (HOSPITAL_COMMUNITY): Payer: Self-pay | Admitting: Internal Medicine

## 2019-12-18 ENCOUNTER — Encounter: Payer: Self-pay | Admitting: Hematology and Oncology

## 2019-12-18 VITALS — BP 126/80 | HR 81 | Wt 181.0 lb

## 2019-12-18 DIAGNOSIS — Z01818 Encounter for other preprocedural examination: Secondary | ICD-10-CM | POA: Diagnosis not present

## 2019-12-18 DIAGNOSIS — I10 Essential (primary) hypertension: Secondary | ICD-10-CM

## 2019-12-18 DIAGNOSIS — Z170 Estrogen receptor positive status [ER+]: Secondary | ICD-10-CM

## 2019-12-18 DIAGNOSIS — N63 Unspecified lump in unspecified breast: Secondary | ICD-10-CM

## 2019-12-18 DIAGNOSIS — I119 Hypertensive heart disease without heart failure: Secondary | ICD-10-CM | POA: Insufficient documentation

## 2019-12-18 DIAGNOSIS — Z79899 Other long term (current) drug therapy: Secondary | ICD-10-CM | POA: Insufficient documentation

## 2019-12-18 DIAGNOSIS — C50211 Malignant neoplasm of upper-inner quadrant of right female breast: Secondary | ICD-10-CM | POA: Diagnosis not present

## 2019-12-18 LAB — ECHOCARDIOGRAM COMPLETE

## 2019-12-18 NOTE — Addendum Note (Signed)
Encounter addended by: Scarlette Calico, RN on: 12/18/2019 11:32 AM  Actions taken: Clinical Note Signed

## 2019-12-18 NOTE — Progress Notes (Signed)
Cardio-Oncology Clinic Note   Referring Physician: Dr Lindi Adie Primary Care: Chevis Pretty, FNP Primary Cardiologist: Dr Haroldine Laws  HPI:  Judith Thomas is a 64 y.o. female with no past medical history who has been referred by Dr. Lindi Adie to establish in the cardio-oncology clinic for monitoring of cardio-toxicity while undergoing chemotherapy.        Malignant neoplasm of upper-inner quadrant of right breast in female, estrogen receptor positive ([place])   09/26/2018 Initial Diagnosis    Two right breast masses 12:00 to 1 o'clock position 1.3 cm and 1.2 cm, they are 1.5 cm apart.  Biopsy revealed grade 2 invasive ductal carcinoma ER 70%- 100%, PR 0% -20%, Ki-67 20%, HER-2 3+ by IHC and FISH ratio 2.15 with a gene copy number of 4.2 for the tumor that was 2+ by IHC, T1c N0 stage Ia    10/24/2018 Cancer Staging    Staging form: Breast, AJCC 8th Edition - Clinical stage from 10/24/2018: Stage IA (cT1c(2), cN0, cM0, G2, ER+, PR+, HER2+) - Signed by Nicholas Lose, MD on 10/24/2018    11/08/2018 -  Neo-Adjuvant Chemotherapy    Neoadjuvant chemotherapy withTaxol Herceptin weekly x67fllowed by Herceptin maintenance vsKadcylafor 1 year    01/09/2019 Genetic Testing    Negative genetic testing on the common hereditary cancer panel. The Hereditary Gene Panel offered by [facility] includes sequencing and/or deletion duplication testing of the following 48 genes: APC, ATM, AXIN2, BARD1, BMPR1A, BRCA1, BRCA2, BRIP1, CDH1, CDK4, CDKN2A (p14ARF), CDKN2A (p16INK4a), CHEK2, CTNNA1, DICER1, EPCAM (Deletion/duplication testing only), GREM1 (promoter region deletion/duplication testing only), KIT, MEN1, MLH1, MSH2, MSH3, MSH6, MUTYH, NBN, NF1, NHTL1, PALB2, PDGFRA, PMS2, POLD1, POLE, PTEN, RAD50, RAD51C, RAD51D, RNF43, SDHB, SDHC, SDHD, SMAD4, SMARCA4. STK11, TP53, TSC1, TSC2, and VHL.  The following genes were evaluated for sequence changes only: SDHA and HOXB13 c.251G>A variant only. The  report date is January 09, 2019.    03/08/2019 Surgery    Right lumpectomy ([name]: IDC s/p neoadjuvant treatment, grade 2, 0.7cm, ER+ 100%, PR+ 5%, HER2 equivocal, Ki67 20%, 0/2 right axillary sentinel lymph nodes negative for carcinoma, with clear margins.      Treatment Plan  1. Neoadjuvant chemotherapy with Taxol Herceptin weekly x12 followed by Herceptin maintenance vs Kadcyla for 1 year 2. Followed by adjuvant radiation therapy  3.  Followed by antiestrogen therapy  Echo 12/18/19 EF 60-65% Grade 2 DD GLS -17.9. Personally reviewed  Echo 1/20  EF 60-65% GLS -19.9%   Echo 6/20 EF 60-65% Grade 2 DD GLS -19.5%  Echo 9/20 EF 60-65% no strain imaging performed.   Here for routine f/u. Very active no CP or SOB. Had finished Kadcyla. No SOB orthopnea or PND. Has had telangectasias pop up on chest and arm.    PMHx - no major PHMHx  Current Outpatient Medications  Medication Sig Dispense Refill  . anastrozole (ARIMIDEX) 1 MG tablet Take 1 tablet (1 mg total) by mouth daily. 90 tablet 3  . fluticasone (FLONASE) 50 MCG/ACT nasal spray Place 1 spray into both nostrils daily as needed for allergies or rhinitis.    .Marland Kitchenloratadine (CLARITIN) 10 MG tablet Take 10 mg by mouth daily.    . Multiple Vitamin (MULTIVITAMIN) tablet Take 1 tablet by mouth daily.     .Vladimir FasterGlycol-Propyl Glycol (SYSTANE FREE OP) Apply to eye at bedtime as needed.     No current facility-administered medications for this encounter.    Allergies  Allergen Reactions  . Paclitaxel Other (See Comments)    Nausea,  flushing      Social History   Socioeconomic History  . Marital status: Married    Spouse name: michael  . Number of children: Not on file  . Years of education: Not on file  . Highest education level: Not on file  Occupational History  . Not on file  Tobacco Use  . Smoking status: Never Smoker  . Smokeless tobacco: Never Used  Substance and Sexual Activity  . Alcohol use: Yes     Comment: rare use  . Drug use: No  . Sexual activity: Not on file  Other Topics Concern  . Not on file  Social History Narrative  . Not on file   Social Determinants of Health   Financial Resource Strain:   . Difficulty of Paying Living Expenses: Not on file  Food Insecurity:   . Worried About Charity fundraiser in the Last Year: Not on file  . Ran Out of Food in the Last Year: Not on file  Transportation Needs:   . Lack of Transportation (Medical): Not on file  . Lack of Transportation (Non-Medical): Not on file  Physical Activity:   . Days of Exercise per Week: Not on file  . Minutes of Exercise per Session: Not on file  Stress:   . Feeling of Stress : Not on file  Social Connections:   . Frequency of Communication with Friends and Family: Not on file  . Frequency of Social Gatherings with Friends and Family: Not on file  . Attends Religious Services: Not on file  . Active Member of Clubs or Organizations: Not on file  . Attends Archivist Meetings: Not on file  . Marital Status: Not on file  Intimate Partner Violence:   . Fear of Current or Ex-Partner: Not on file  . Emotionally Abused: Not on file  . Physically Abused: Not on file  . Sexually Abused: Not on file      Family History  Problem Relation Age of Onset  . Liver disease Mother   . Heart disease Mother        afib  . Heart disease Father   . Asthma Father   . Diabetes Father   . Bladder Cancer Father        smoker  . Breast cancer Maternal Aunt   . Heart disease Maternal Grandfather   . Kidney disease Paternal Grandmother   . Heart disease Paternal Grandfather   . Heart disease Other   . Breast cancer Maternal Aunt        mets to bone    Vitals:   12/18/19 1031  BP: 126/80  Pulse: 81  SpO2: 98%  Weight: 82.1 kg (181 lb)     PHYSICAL EXAM: General:  Well appearing. No resp difficulty HEENT: normal Neck: supple. no JVD. Carotids 2+ bilat; no bruits. No lymphadenopathy or  thryomegaly appreciated. Cor: PMI nondisplaced. Regular rate & rhythm. No rubs, gallops or murmurs. + port  Lungs: clear Abdomen: soft, nontender, nondistended. No hepatosplenomegaly. No bruits or masses. Good bowel sounds. Extremities: no cyanosis, clubbing, rash, edema  + telengectacias Neuro: alert & orientedx3, cranial nerves grossly intact. moves all 4 extremities w/o difficulty. Affect pleasant  NSR 76 No ST-T wave abnormalities. Personally reviewed   ASSESSMENT & PLAN:  1. Malignant neoplasm of upper-inner quadrant of right breast in female, estrogen receptor positive ([place]) - Echo 1/20 shows EF 60-65% GLS -19.9% - She started chemo + Hercetpin on 11/08/2018 - Echo today EF 60-65%  GLS -17.6 - I reviewed echos personally. EF and Doppler parameters stable. No HF on exam. She has completed Herceptin. F/u PRN   2. HTN.  - Blood pressure well controlled. Continue current regimen.  3. Telangiectasias - likely due to chemo   Glori Bickers, MD  11:21 AM

## 2019-12-18 NOTE — Patient Instructions (Signed)
CONGRATULATIONS, you have graduated our clinic  IF you need [place] in the future please give [place] a call.

## 2019-12-18 NOTE — Progress Notes (Signed)
Echocardiogram 2D Echocardiogram has been performed.  Oneal Deputy Haven Foss 12/18/2019, 9:52 AM

## 2019-12-19 ENCOUNTER — Other Ambulatory Visit: Payer: Self-pay | Admitting: *Deleted

## 2019-12-19 DIAGNOSIS — C50211 Malignant neoplasm of upper-inner quadrant of right female breast: Secondary | ICD-10-CM

## 2019-12-20 ENCOUNTER — Encounter: Payer: Self-pay | Admitting: Hematology and Oncology

## 2019-12-20 ENCOUNTER — Encounter (HOSPITAL_BASED_OUTPATIENT_CLINIC_OR_DEPARTMENT_OTHER): Payer: Self-pay | Admitting: General Surgery

## 2019-12-20 ENCOUNTER — Other Ambulatory Visit: Payer: Self-pay

## 2019-12-25 DIAGNOSIS — R928 Other abnormal and inconclusive findings on diagnostic imaging of breast: Secondary | ICD-10-CM | POA: Diagnosis not present

## 2019-12-25 DIAGNOSIS — Z853 Personal history of malignant neoplasm of breast: Secondary | ICD-10-CM | POA: Diagnosis not present

## 2019-12-25 DIAGNOSIS — N6310 Unspecified lump in the right breast, unspecified quadrant: Secondary | ICD-10-CM | POA: Diagnosis not present

## 2019-12-25 NOTE — Progress Notes (Signed)

## 2019-12-26 ENCOUNTER — Other Ambulatory Visit (HOSPITAL_COMMUNITY)
Admission: RE | Admit: 2019-12-26 | Discharge: 2019-12-26 | Disposition: A | Discharge status: Home / Self Care | Payer: BC Managed Care – PPO | Source: Ambulatory Visit | Attending: Cardiology | Admitting: Cardiology

## 2019-12-26 DIAGNOSIS — Z01812 Encounter for preprocedural laboratory examination: Secondary | ICD-10-CM | POA: Diagnosis not present

## 2019-12-26 DIAGNOSIS — Z20822 Contact with and (suspected) exposure to covid-19: Secondary | ICD-10-CM | POA: Insufficient documentation

## 2019-12-26 LAB — SARS CORONAVIRUS 2 (TAT 6-24 HRS): SARS Coronavirus 2: NEGATIVE

## 2019-12-30 ENCOUNTER — Ambulatory Visit (HOSPITAL_BASED_OUTPATIENT_CLINIC_OR_DEPARTMENT_OTHER): Payer: BC Managed Care – PPO | Admitting: Anesthesiology

## 2019-12-30 ENCOUNTER — Encounter (HOSPITAL_BASED_OUTPATIENT_CLINIC_OR_DEPARTMENT_OTHER): Payer: Self-pay | Admitting: General Surgery

## 2019-12-30 ENCOUNTER — Encounter (HOSPITAL_BASED_OUTPATIENT_CLINIC_OR_DEPARTMENT_OTHER)
Admission: RE | Disposition: A | Discharge status: Home / Self Care | Payer: Self-pay | Source: Home / Self Care | Attending: General Surgery

## 2019-12-30 ENCOUNTER — Other Ambulatory Visit: Payer: Self-pay

## 2019-12-30 ENCOUNTER — Ambulatory Visit (HOSPITAL_BASED_OUTPATIENT_CLINIC_OR_DEPARTMENT_OTHER)
Admission: RE | Admit: 2019-12-30 | Discharge: 2019-12-30 | Disposition: A | Discharge status: Home / Self Care | Payer: BC Managed Care – PPO | Attending: General Surgery | Admitting: General Surgery

## 2019-12-30 DIAGNOSIS — Z452 Encounter for adjustment and management of vascular access device: Secondary | ICD-10-CM | POA: Insufficient documentation

## 2019-12-30 DIAGNOSIS — Z923 Personal history of irradiation: Secondary | ICD-10-CM | POA: Diagnosis not present

## 2019-12-30 DIAGNOSIS — Z853 Personal history of malignant neoplasm of breast: Secondary | ICD-10-CM | POA: Diagnosis not present

## 2019-12-30 DIAGNOSIS — Z9221 Personal history of antineoplastic chemotherapy: Secondary | ICD-10-CM | POA: Diagnosis not present

## 2019-12-30 DIAGNOSIS — C50211 Malignant neoplasm of upper-inner quadrant of right female breast: Secondary | ICD-10-CM | POA: Diagnosis not present

## 2019-12-30 DIAGNOSIS — C50911 Malignant neoplasm of unspecified site of right female breast: Secondary | ICD-10-CM | POA: Diagnosis not present

## 2019-12-30 HISTORY — PX: PORT-A-CATH REMOVAL: SHX5289

## 2019-12-30 SURGERY — REMOVAL PORT-A-CATH
Anesthesia: Monitor Anesthesia Care | Site: Chest | Laterality: Left

## 2019-12-30 MED ORDER — ONDANSETRON HCL 4 MG/2ML IJ SOLN: 4.0000 mg | Freq: Four times a day (QID) | INTRAMUSCULAR | Status: DC | PRN

## 2019-12-30 MED ORDER — OXYCODONE HCL 5 MG PO TABS: 5.0000 mg | ORAL_TABLET | Freq: Once | ORAL | Status: DC | PRN

## 2019-12-30 MED ORDER — OXYCODONE HCL 5 MG/5ML PO SOLN: 5.0000 mg | Freq: Once | ORAL | Status: DC | PRN

## 2019-12-30 MED ORDER — ACETAMINOPHEN 500 MG PO TABS
1000.0000 mg | ORAL_TABLET | ORAL | Status: AC
  Administered 2019-12-30: 1000 mg via ORAL

## 2019-12-30 MED ORDER — PROPOFOL 500 MG/50ML IV EMUL
INTRAVENOUS | Status: DC | PRN
  Administered 2019-12-30: 50 ug/kg/min via INTRAVENOUS

## 2019-12-30 MED ORDER — MIDAZOLAM HCL 2 MG/2ML IJ SOLN
INTRAMUSCULAR | Status: AC
  Filled 2019-12-30: qty 2

## 2019-12-30 MED ORDER — LACTATED RINGERS IV SOLN: INTRAVENOUS | Status: DC

## 2019-12-30 MED ORDER — CHLORHEXIDINE GLUCONATE CLOTH 2 % EX PADS: 6.0000 | MEDICATED_PAD | Freq: Once | CUTANEOUS | Status: DC

## 2019-12-30 MED ORDER — CELECOXIB 200 MG PO CAPS
ORAL_CAPSULE | ORAL | Status: AC
  Filled 2019-12-30: qty 1

## 2019-12-30 MED ORDER — MIDAZOLAM HCL 5 MG/5ML IJ SOLN
INTRAMUSCULAR | Status: DC | PRN
  Administered 2019-12-30: 2 mg via INTRAVENOUS

## 2019-12-30 MED ORDER — FENTANYL CITRATE (PF) 100 MCG/2ML IJ SOLN: 25.0000 ug | INTRAMUSCULAR | Status: DC | PRN

## 2019-12-30 MED ORDER — PROPOFOL 10 MG/ML IV BOLUS
INTRAVENOUS | Status: DC | PRN
  Administered 2019-12-30 (×2): 20 mg via INTRAVENOUS

## 2019-12-30 MED ORDER — BUPIVACAINE-EPINEPHRINE 0.25% -1:200000 IJ SOLN
INTRAMUSCULAR | Status: DC | PRN
  Administered 2019-12-30: 10 mL

## 2019-12-30 MED ORDER — CELECOXIB 200 MG PO CAPS
200.0000 mg | ORAL_CAPSULE | ORAL | Status: AC
  Administered 2019-12-30: 200 mg via ORAL

## 2019-12-30 MED ORDER — ACETAMINOPHEN 500 MG PO TABS
ORAL_TABLET | ORAL | Status: AC
  Filled 2019-12-30: qty 2

## 2019-12-30 MED ORDER — FENTANYL CITRATE (PF) 100 MCG/2ML IJ SOLN
INTRAMUSCULAR | Status: AC
  Filled 2019-12-30: qty 2

## 2019-12-30 MED ORDER — GABAPENTIN 300 MG PO CAPS
300.0000 mg | ORAL_CAPSULE | ORAL | Status: AC
  Administered 2019-12-30: 300 mg via ORAL

## 2019-12-30 MED ORDER — LIDOCAINE-EPINEPHRINE 1 %-1:100000 IJ SOLN
INTRAMUSCULAR | Status: DC | PRN
  Administered 2019-12-30: 10 mL

## 2019-12-30 MED ORDER — GABAPENTIN 300 MG PO CAPS
ORAL_CAPSULE | ORAL | Status: AC
  Filled 2019-12-30: qty 1

## 2019-12-30 MED ORDER — FENTANYL CITRATE (PF) 100 MCG/2ML IJ SOLN
INTRAMUSCULAR | Status: DC | PRN
  Administered 2019-12-30: 100 ug via INTRAVENOUS

## 2019-12-30 SURGICAL SUPPLY — 29 items
ADH SKN CLS APL DERMABOND .7 (GAUZE/BANDAGES/DRESSINGS) ×1
APL PRP STRL LF DISP 70% ISPRP (MISCELLANEOUS) ×1
BLADE SURG 15 STRL LF DISP TIS (BLADE) ×1 IMPLANT
BLADE SURG 15 STRL SS (BLADE) ×3
CHLORAPREP W/TINT 26 (MISCELLANEOUS) ×3 IMPLANT
COVER BACK TABLE 60X90IN (DRAPES) ×3 IMPLANT
COVER MAYO STAND STRL (DRAPES) ×3 IMPLANT
COVER WAND RF STERILE (DRAPES) IMPLANT
DECANTER SPIKE VIAL GLASS SM (MISCELLANEOUS) ×5 IMPLANT
DERMABOND ADVANCED (GAUZE/BANDAGES/DRESSINGS) ×2
DERMABOND ADVANCED .7 DNX12 (GAUZE/BANDAGES/DRESSINGS) ×1 IMPLANT
DRAPE LAPAROTOMY 100X72 PEDS (DRAPES) ×3 IMPLANT
DRAPE UTILITY XL STRL (DRAPES) ×3 IMPLANT
ELECT COATED BLADE 2.86 ST (ELECTRODE) IMPLANT
ELECT REM PT RETURN 9FT ADLT (ELECTROSURGICAL)
ELECTRODE REM PT RTRN 9FT ADLT (ELECTROSURGICAL) IMPLANT
GLOVE BIO SURGEON STRL SZ7.5 (GLOVE) ×3 IMPLANT
GOWN STRL REUS W/ TWL LRG LVL3 (GOWN DISPOSABLE) ×2 IMPLANT
GOWN STRL REUS W/TWL LRG LVL3 (GOWN DISPOSABLE) ×6
NDL HYPO 25X1 1.5 SAFETY (NEEDLE) ×1 IMPLANT
NEEDLE HYPO 25X1 1.5 SAFETY (NEEDLE) ×3 IMPLANT
PACK BASIN DAY SURGERY FS (CUSTOM PROCEDURE TRAY) ×3 IMPLANT
PENCIL SMOKE EVACUATOR (MISCELLANEOUS) ×2 IMPLANT
SLEEVE SCD COMPRESS KNEE MED (MISCELLANEOUS) IMPLANT
SUT MON AB 4-0 PC3 18 (SUTURE) ×3 IMPLANT
SUT VIC AB 3-0 SH 27 (SUTURE) ×3
SUT VIC AB 3-0 SH 27X BRD (SUTURE) ×1 IMPLANT
SYR CONTROL 10ML LL (SYRINGE) ×3 IMPLANT
TOWEL GREEN STERILE FF (TOWEL DISPOSABLE) ×3 IMPLANT

## 2019-12-30 NOTE — Anesthesia Postprocedure Evaluation (Signed)
Anesthesia Post Note  Patient: Judith Thomas  Procedure(s) Performed: REMOVAL PORT-A-CATH (Left Chest)     Patient location during evaluation: PACU Anesthesia Type: MAC Level of consciousness: awake and alert Pain management: pain level controlled Vital Signs Assessment: post-procedure vital signs reviewed and stable Respiratory status: spontaneous breathing, nonlabored ventilation, respiratory function stable and patient connected to nasal cannula oxygen Cardiovascular status: stable and blood pressure returned to baseline Postop Assessment: no apparent nausea or vomiting Anesthetic complications: no    Last Vitals:  Vitals:   12/30/19 1515 12/30/19 1530  BP: (!) 105/55 (!) 105/58  Pulse: 71 73  Resp: 14 17  Temp:    SpO2: 98% 97%    Last Pain:  Vitals:   12/30/19 1530  TempSrc:   PainSc: 0-No pain                 Shyla Gayheart S

## 2019-12-30 NOTE — Anesthesia Preprocedure Evaluation (Signed)
Anesthesia Evaluation  Patient identified by MRN, date of birth, ID band Patient awake    Reviewed: Allergy & Precautions, H&P , NPO status , Patient's Chart, lab work & pertinent test results  Airway Mallampati: II   Neck ROM: full    Dental   Pulmonary neg pulmonary ROS,    breath sounds clear to auscultation       Cardiovascular negative cardio ROS   Rhythm:regular Rate:Normal     Neuro/Psych    GI/Hepatic   Endo/Other    Renal/GU      Musculoskeletal   Abdominal   Peds  Hematology   Anesthesia Other Findings   Reproductive/Obstetrics H/o breast CA                             Anesthesia Physical Anesthesia Plan  ASA: II  Anesthesia Plan: MAC   Post-op Pain Management:    Induction: Intravenous  PONV Risk Score and Plan: 2 and Propofol infusion, Treatment may vary due to age or medical condition and Ondansetron  Airway Management Planned: Simple Face Mask  Additional Equipment:   Intra-op Plan:   Post-operative Plan:   Informed Consent: I have reviewed the patients History and Physical, chart, labs and discussed the procedure including the risks, benefits and alternatives for the proposed anesthesia with the patient or authorized representative who has indicated his/her understanding and acceptance.       Plan Discussed with: CRNA, Anesthesiologist and Surgeon  Anesthesia Plan Comments:         Anesthesia Quick Evaluation

## 2019-12-30 NOTE — Op Note (Signed)
12/30/2019  2:56 PM  PATIENT:  Judith Thomas  64 y.o. female  PRE-OPERATIVE DIAGNOSIS:  RIGHT BREAST CANCER  POST-OPERATIVE DIAGNOSIS:  RIGHT BREAST CANCER  PROCEDURE:  Procedure(s): REMOVAL PORT-A-CATH (Left)  SURGEON:  Surgeon(s) and Role:    * Jovita Kussmaul, MD - Primary  PHYSICIAN ASSISTANT:   ASSISTANTS: none   ANESTHESIA:   local and IV sedation  EBL:  minimal   BLOOD ADMINISTERED:none  DRAINS: none   LOCAL MEDICATIONS USED:  MARCAINE     SPECIMEN:  No Specimen  DISPOSITION OF SPECIMEN:  N/A  COUNTS:  YES  TOURNIQUET:  * No tourniquets in log *  DICTATION: .Dragon Dictation   After informed consent was obtained the patient was brought to the operating room and placed in the supine position on the operating table.  After adequate IV sedation had been given the patient's left chest was prepped with ChloraPrep, allowed to dry, and draped in usual sterile manner.  An appropriate timeout was performed.  The area around the port was infiltrated with a combination of 1% lidocaine and quarter percent Marcaine.  A small incision was then made through her previous incision with a 15 blade knife.  The incision was carried through the subcutaneous tissue sharply with a 15 blade knife until the capsule surrounding the port was opened.  The 2 anchoring stitches were divided and removed.  The port was then gently pushed out of its pocket and with gentle traction was removed from the patient without difficulty.  Pressure was held for several minutes until the area was completely hemostatic.  The deep layer of the wound was then closed with interrupted 3-0 Vicryl stitches including a 3-0 Vicryl figure-of-eight stitch to close the opening of the tract.  The skin was then closed with a 4-0 Monocryl subcuticular stitch.  Dermabond dressings were applied.  The patient tolerated the procedure well.  At the end of the case all needle sponge and instrument counts were correct.  The patient  was then awakened and taken to recovery in stable condition.  PLAN OF CARE: Discharge to home after PACU  PATIENT DISPOSITION:  PACU - hemodynamically stable.   Delay start of Pharmacological VTE agent (>24hrs) due to surgical blood loss or risk of bleeding: not applicable

## 2019-12-30 NOTE — Transfer of Care (Signed)
Immediate Anesthesia Transfer of Care Note  Patient: Arlone Lenhardt  Procedure(s) Performed: REMOVAL PORT-A-CATH (Left Chest)  Patient Location: PACU  Anesthesia Type:MAC  Level of Consciousness: awake, alert  and oriented  Airway & Oxygen Therapy: Patient Spontanous Breathing and Patient connected to face mask oxygen  Post-op Assessment: Report given to RN and Post -op Vital signs reviewed and stable  Post vital signs: Reviewed and stable  Last Vitals:  Vitals Value Taken Time  BP 99/43 12/30/19 1459  Temp    Pulse 79 12/30/19 1500  Resp 18 12/30/19 1500  SpO2 100 % 12/30/19 1500  Vitals shown include unvalidated device data.  Last Pain:  Vitals:   12/30/19 1255  TempSrc: Tympanic  PainSc: 0-No pain         Complications: No apparent anesthesia complications

## 2019-12-30 NOTE — H&P (Signed)
Judith Thomas  Location: [facility] Patient #: [id number] DOB: 06/17/1956 Married / Language: English / Race: White Female   History of Present Illness  The patient is a 64 year old female who presents for a follow-up for Breast cancer. 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 The tolerated surgery well. She finished radiation therapy in July. She continues to receive Herceptin therapy. This will likely be followed by anti-estrogens. She denies any pain in the breast. She did have some swelling in the axilla but has been getting physical therapy and massage and the swelling seems to be going down. She believes it significantly smaller than it was.    Review of Systems General Not Present- Appetite Loss, Chills, Fatigue, Fever, Night Sweats, Weight Gain and Weight Loss. Skin Not Present- Change in Wart/Mole, Dryness, Hives, Jaundice, New Lesions, Non-Healing Wounds, Rash and Ulcer. HEENT Present- Seasonal Allergies and Wears glasses/contact lenses. Not Present- Earache, Hearing Loss, Hoarseness, Nose Bleed, Oral Ulcers, Ringing in the Ears, Sinus Pain, Sore Throat, Visual Disturbances and Yellow Eyes. Respiratory Not Present- Bloody sputum, Chronic Cough, Difficulty Breathing, Snoring and Wheezing. Breast Present- Breast Mass. Not Present- Breast Pain, Nipple Discharge and Skin Changes. Cardiovascular Not Present- Chest Pain, Difficulty Breathing Lying Down, Leg Cramps, Palpitations, Rapid Heart Rate, Shortness of Breath and Swelling of Extremities. Gastrointestinal Not Present- Abdominal Pain, Bloating, Bloody Stool, Change in Bowel Habits, Chronic diarrhea, Constipation, Difficulty Swallowing, Excessive gas, Gets full quickly at meals, Hemorrhoids, Indigestion, Nausea,  Rectal Pain and Vomiting. Female Genitourinary Present- Frequency. Not Present- Nocturia, Painful Urination, Pelvic Pain and Urgency. Musculoskeletal Not Present- Back Pain, Joint Pain, Joint Stiffness, Muscle Pain, Muscle Weakness and Swelling of Extremities. Neurological Not Present- Decreased Memory, Fainting, Headaches, Numbness, Seizures, Tingling, Tremor, Trouble walking and Weakness. Psychiatric Not Present- Anxiety, Bipolar, Change in Sleep Pattern, Depression, Fearful and Frequent crying. Endocrine Present- Hot flashes. Not Present- Cold Intolerance, Excessive Hunger, Hair Changes, Heat Intolerance and New Diabetes. Hematology Not Present- Blood Thinners, Easy Bruising, Excessive bleeding, Gland problems, HIV and Persistent Infections.   Physical Exam  General Mental Status-Alert. General Appearance-Consistent with stated age. Hydration-Well hydrated. Voice-Normal.  Head and Neck Head-normocephalic, atraumatic with no lesions or palpable masses. Trachea-midline. Thyroid Gland Characteristics - normal size and consistency.  Eye Eyeball - Bilateral-Extraocular movements intact. Sclera/Conjunctiva - Bilateral-No scleral icterus.  Chest and Lung Exam Chest and lung exam reveals -quiet, even and easy respiratory effort with no use of accessory muscles and on auscultation, normal breath sounds, no adventitious sounds and normal vocal resonance. Inspection Chest Wall - Normal. Back - normal.  Breast Note: The right breast and axillary incisions are healing nicely with no sign of infection. She has a small residual seroma in the right axilla that is continuing to improve. There is no palpable mass in either breast. There is no palpable axillary, supra clavicular, or cervical lymphadenopathy.   Cardiovascular Cardiovascular examination reveals -normal heart sounds, regular rate and rhythm with no murmurs and normal pedal pulses  bilaterally.  Abdomen Inspection Inspection of the abdomen reveals - No Hernias. Skin - Scar - no surgical scars. Palpation/Percussion Palpation and Percussion of the abdomen reveal - Soft, Non Tender, No Rebound tenderness, No Rigidity (guarding) and No hepatosplenomegaly. Auscultation Auscultation of the abdomen reveals - Bowel  sounds normal.  Neurologic Neurologic evaluation reveals -alert and oriented x 3 with no impairment of recent or remote memory. Mental Status-Normal.  Musculoskeletal Normal Exam - Left-Upper Extremity Strength Normal and Lower Extremity Strength Normal. Normal Exam - Right-Upper Extremity Strength Normal and Lower Extremity Strength Normal.  Lymphatic Head & Neck  General Head & Neck Lymphatics: Bilateral - Description - Normal. Axillary  General Axillary Region: Bilateral - Description - Normal. Tenderness - Non Tender. Femoral & Inguinal  Generalized Femoral & Inguinal Lymphatics: Bilateral - Description - Normal. Tenderness - Non Tender.    Assessment & Plan  MALIGNANT NEOPLASM OF UPPER-INNER QUADRANT OF RIGHT BREAST IN FEMALE, ESTROGEN RECEPTOR POSITIVE (C50.211) Impression: The patient is about 3 months status post right breast lumpectomy for breast cancer. She has completed radiation therapy and is continuing with Herceptin therapy. She will follow this with anti-estrogens. She continues to do well with no evidence of recurrence. At this point she will continue to do regular self exams. I will plan to see her back in about 6 months. Current Plans Follow up with [place] in the office in 6 MONTHS.   Call us sooner as needed.

## 2019-12-30 NOTE — Discharge Instructions (Signed)

## 2019-12-30 NOTE — Interval H&P Note (Signed)
History and Physical Interval Note:  12/30/2019 2:15 PM  Judith Thomas  has presented today for surgery, with the diagnosis of RIGHT BREAST CANCER.  The various methods of treatment have been discussed with the patient and family. After consideration of risks, benefits and other options for treatment, the patient has consented to  Procedure(s): REMOVAL PORT-A-CATH (N/A) as a surgical intervention.  The patient's history has been reviewed, patient examined, no change in status, stable for surgery.  I have reviewed the patient's chart and labs.  Questions were answered to the patient's satisfaction.     [name]

## 2019-12-31 ENCOUNTER — Encounter: Payer: Self-pay | Admitting: Adult Health

## 2019-12-31 ENCOUNTER — Encounter: Payer: Self-pay | Admitting: *Deleted

## 2020-01-06 ENCOUNTER — Telehealth: Payer: Self-pay

## 2020-01-06 ENCOUNTER — Inpatient Hospital Stay: Payer: BC Managed Care – PPO | Attending: Hematology and Oncology | Admitting: Adult Health

## 2020-01-06 ENCOUNTER — Other Ambulatory Visit: Payer: Self-pay

## 2020-01-06 ENCOUNTER — Encounter: Payer: Self-pay | Admitting: Adult Health

## 2020-01-06 ENCOUNTER — Telehealth: Payer: Self-pay | Admitting: Adult Health

## 2020-01-06 VITALS — BP 149/91 | HR 86 | Temp 97.8°F | Resp 18 | Ht 65.0 in | Wt 182.0 lb

## 2020-01-06 DIAGNOSIS — C50211 Malignant neoplasm of upper-inner quadrant of right female breast: Secondary | ICD-10-CM | POA: Diagnosis not present

## 2020-01-06 DIAGNOSIS — Z923 Personal history of irradiation: Secondary | ICD-10-CM | POA: Insufficient documentation

## 2020-01-06 DIAGNOSIS — Z170 Estrogen receptor positive status [ER+]: Secondary | ICD-10-CM | POA: Diagnosis not present

## 2020-01-06 DIAGNOSIS — N951 Menopausal and female climacteric states: Secondary | ICD-10-CM | POA: Insufficient documentation

## 2020-01-06 DIAGNOSIS — Z9221 Personal history of antineoplastic chemotherapy: Secondary | ICD-10-CM | POA: Diagnosis not present

## 2020-01-06 NOTE — Telephone Encounter (Signed)
Per Wilber Bihari NP called [facility] [phone]) to request patients bone density scan from 11/2019 be faxed over to fax#[phone]). Currently waiting on fax.

## 2020-01-06 NOTE — Telephone Encounter (Signed)
Scheduled appts per 3/29 los. Left voicemail with new appt details. Mailed appt reminder and calendar.

## 2020-01-06 NOTE — Progress Notes (Signed)
SURVIVORSHIP VISIT:   BRIEF ONCOLOGIC HISTORY:  Oncology History  Malignant neoplasm of upper-inner quadrant of right breast in female, estrogen receptor positive ([place])  09/26/2018 Initial Diagnosis   Two right breast masses 12:00 to 1 o'clock position 1.3 cm and 1.2 cm, they are 1.5 cm apart.  Biopsy revealed grade 2 invasive ductal carcinoma ER 70%- 100%, PR 0% -20%, Ki-67 20%, HER-2 3+ by IHC and FISH ratio 2.15 with a gene copy number of 4.2 for the tumor that was 2+ by IHC, T1c N0 stage Ia   10/24/2018 Cancer Staging   Staging form: Breast, AJCC 8th Edition - Clinical stage from 10/24/2018: Stage IA (cT1c(2), cN0, cM0, G2, ER+, PR+, HER2+) - Signed by Nicholas Lose, MD on 10/24/2018   11/15/2018 - 10/24/2019 Chemotherapy   PACLitaxel-protein bound (ABRAXANE) chemo infusion 150 mg, 80 mg/m2 = 150 mg (100 % of original dose 80 mg/m2), Intravenous,  Once, 3 of 3 cycles. Dose modification: 80 mg/m2 (original dose 80 mg/m2, Cycle 1). Administration: 150 mg (11/29/2018), 150 mg (12/06/2018), 150 mg (12/13/2018), 150 mg (12/20/2018), 150 mg (12/27/2018), 150 mg (01/03/2019), 150 mg (01/10/2019), 150 mg (01/17/2019), 150 mg (01/24/2019)  ondansetron (ZOFRAN) 8 mg in sodium chloride 0.9 % 50 mL IVPB, 8 mg (100 % of original dose 8 mg), Intravenous,  Once, 1 of 1 cycle. Dose modification: 8 mg (original dose 8 mg, Cycle 1)  trastuzumab (HERCEPTIN) 336 mg in sodium chloride 0.9 % 250 mL chemo infusion, 4 mg/kg = 336 mg, Intravenous,  Once, 5 of 16 cycles. Dose modification: 6 mg/kg (original dose 2 mg/kg, Cycle 3, Reason: Other (see comments), Comment: changing to q3week tx). Administration: 336 mg (11/08/2018), 168 mg (11/15/2018), 168 mg (12/06/2018), 504 mg (02/14/2019), 504 mg (03/05/2019), 168 mg (11/22/2018), 168 mg (11/29/2018), 168 mg (12/13/2018), 168 mg (12/20/2018), 168 mg (12/27/2018), 168 mg (01/03/2019), 168 mg (01/10/2019), 168 mg (01/17/2019), 504 mg (01/24/2019)  PACLitaxel (TAXOL) 156 mg in sodium chloride 0.9 % 250 mL  chemo infusion (</= 85m/m2), 80 mg/m2 = 156 mg, Intravenous,  Once, 1 of 1 cycle. Administration: 156 mg (11/08/2018), 156 mg (11/15/2018), 156 mg (11/22/2018)  ado-trastuzumab emtansine (KADCYLA) 300 mg in sodium chloride 0.9 % 250 mL chemo infusion, 3.6 mg/kg = 300 mg, Intravenous, Once, 11 of 11 cycles. Administration: 300 mg (03/28/2019), 300 mg (04/18/2019), 300 mg (06/21/2019), 300 mg (05/09/2019), 300 mg (05/30/2019), 300 mg (07/11/2019), 300 mg (08/01/2019), 300 mg (08/22/2019), 300 mg (09/12/2019), 260 mg (10/02/2019), 260 mg (10/24/2019)    01/09/2019 Genetic Testing   Negative genetic testing on the common hereditary cancer panel. The Hereditary Gene Panel offered by [facility] includes sequencing and/or deletion duplication testing of the following 48 genes: APC, ATM, AXIN2, BARD1, BMPR1A, BRCA1, BRCA2, BRIP1, CDH1, CDK4, CDKN2A (p14ARF), CDKN2A (p16INK4a), CHEK2, CTNNA1, DICER1, EPCAM (Deletion/duplication testing only), GREM1 (promoter region deletion/duplication testing only), KIT, MEN1, MLH1, MSH2, MSH3, MSH6, MUTYH, NBN, NF1, NHTL1, PALB2, PDGFRA, PMS2, POLD1, POLE, PTEN, RAD50, RAD51C, RAD51D, RNF43, SDHB, SDHC, SDHD, SMAD4, SMARCA4. STK11, TP53, TSC1, TSC2, and VHL.  The following genes were evaluated for sequence changes only: SDHA and HOXB13 c.251G>A variant only. The report date Thomas January 09, 2019.   03/08/2019 Surgery   Right lumpectomy ([name] ([phone]: IDC s/p neoadjuvant treatment, grade 2, 0.7cm, ER+ 100%, PR+ 5%, HER2 equivocal, Ki67 20%, 2 right axillary sentinel lymph nodes negative for carcinoma, with clear margins.    03/29/2019 Cancer Staging   Staging form: Breast, AJCC 8th Edition - Pathologic stage from 03/29/2019: No Stage  Recommended (ypT1b, pN0, cM0, G2, ER+, PR+, HER2: Equivocal)     04/08/2019 - 05/06/2019 Radiation Therapy   The patient initially received a dose of 40.05 Gy in 15 fractions to the breast using whole-breast tangent fields. This was delivered using a 3-D conformal  technique. The pt received a boost delivering an additional 10 Gy in 5 fractions using a electron boost with 37mV electrons. The total dose was 50.05 Gy.    09/2019 - 09/2024 Anti-estrogen oral therapy   Anastrozole     INTERVAL HISTORY:  Judith Thomas review her survivorship care plan detailing her treatment course for breast cancer, as well as monitoring long-term side effects of that treatment, education regarding health maintenance, screening, and overall wellness and health promotion.     Overall, Judith Thomas feeling quite well.  She Thomas taking anastrozole daily and Thomas tolerating it well.  She has no new issues related to this, other than hot flashes which are tolerable.    REVIEW OF SYSTEMS:  Review of Systems  Constitutional: Negative for appetite change, chills, fatigue, fever and unexpected weight change.  HENT:   Negative for hearing loss, lump/mass, sore throat and trouble swallowing.   Eyes: Negative for eye problems.  Respiratory: Negative for chest tightness, cough and shortness of breath.   Cardiovascular: Negative for chest pain, leg swelling and palpitations.  Gastrointestinal: Negative for abdominal distention, abdominal pain, blood in stool, constipation, diarrhea, nausea and vomiting.  Endocrine: Negative for hot flashes.  Genitourinary: Negative for difficulty urinating.   Musculoskeletal: Negative for arthralgias.  Skin: Negative for itching and rash.  Neurological: Negative for dizziness, extremity weakness, headaches and numbness.  Hematological: Negative for adenopathy. Does not bruise/bleed easily.  Psychiatric/Behavioral: Negative for depression. The patient Thomas not nervous/anxious.   Breast: Denies any new nodularity, masses, tenderness, nipple changes, or nipple discharge.      ONCOLOGY TREATMENT TEAM:  1. Surgeon:  [facility] 2. Medical Oncologist: Dr. GLindi Adie 3. Radiation Oncologist: Dr. SIsidore Moos   PAST  MEDICAL/SURGICAL HISTORY:  Past Medical History:  Diagnosis Date  . Breast cancer ([place]    right  . Family history of bladder cancer   . Family history of breast cancer   . Knee pain, chronic 10/11/2015   Past Surgical History:  Procedure Laterality Date  . BREAST LUMPECTOMY WITH RADIOACTIVE SEED AND SENTINEL LYMPH NODE BIOPSY Right 03/08/2019   Procedure: RIGHT BREAST RADIOACTIVE SEED LUMPECTOMY X2 AND SENTINEL LYMPH NODE BIOPSY;  Surgeon: TJovita Kussmaul MD;  [facility]  Service: General;  Laterality: Right;  . COLONOSCOPY  03/2010  . none    . PORT-A-CATH REMOVAL Left 12/30/2019   Procedure: REMOVAL PORT-A-CATH;  Surgeon: TJovita Kussmaul MD;  [facility]  Service: General;  Laterality: Left;  . PORTACATH PLACEMENT Left 11/07/2018   Procedure: INSERTION PORT-A-CATH WITH ULTRASOUND;  Surgeon: TJovita Kussmaul MD;  [facility]  Service: General;  Laterality: Left;     ALLERGIES:  Allergies  Allergen Reactions  . Paclitaxel Other (See Comments)    Nausea, flushing     CURRENT MEDICATIONS:  Outpatient Encounter Medications as of 01/06/2020  Medication Sig  . anastrozole (ARIMIDEX) 1 MG tablet Take 1 tablet (1 mg total) by mouth daily.  . fluticasone (FLONASE) 50 MCG/ACT nasal spray Place 1 spray into both nostrils daily as needed for allergies or rhinitis.  .Marland Kitchenloratadine (CLARITIN) 10 MG tablet Take 10 mg  by mouth daily.  . Multiple Vitamin (MULTIVITAMIN) tablet Take 1 tablet by mouth daily.   Vladimir Faster Glycol-Propyl Glycol (SYSTANE FREE OP) Apply to eye at bedtime as needed.   No facility-administered encounter medications on file as of 01/06/2020.     ONCOLOGIC FAMILY HISTORY:  Family History  Problem Relation Age of Onset  . Liver disease Mother   . Heart disease Mother        afib  . Heart disease Father   . Asthma Father   . Diabetes Father   . Bladder Cancer Father        smoker  . Breast  cancer Maternal Aunt   . Heart disease Maternal Grandfather   . Kidney disease Paternal Grandmother   . Heart disease Paternal Grandfather   . Heart disease Other   . Breast cancer Maternal Aunt        mets to bone     GENETIC COUNSELING/TESTING: See above  SOCIAL HISTORY:  Social History   Socioeconomic History  . Marital status: Married    Spouse name: michael  . Number of children: Not on file  . Years of education: Not on file  . Highest education level: Not on file  Occupational History  . Not on file  Tobacco Use  . Smoking status: Never Smoker  . Smokeless tobacco: Never Used  Substance and Sexual Activity  . Alcohol use: Yes    Comment: rare use  . Drug use: No  . Sexual activity: Not on file  Other Topics Concern  . Not on file  Social History Narrative  . Not on file   Social Determinants of Health   Financial Resource Strain:   . Difficulty of Paying Living Expenses:   Food Insecurity:   . Worried About Charity fundraiser in the Last Year:   . Arboriculturist in the Last Year:   Transportation Needs:   . Film/video editor (Medical):   Marland Kitchen Lack of Transportation (Non-Medical):   Physical Activity:   . Days of Exercise per Week:   . Minutes of Exercise per Session:   Stress:   . Feeling of Stress :   Social Connections:   . Frequency of Communication with Friends and Family:   . Frequency of Social Gatherings with Friends and Family:   . Attends Religious Services:   . Active Member of Clubs or Organizations:   . Attends Archivist Meetings:   Marland Kitchen Marital Status:   Intimate Partner Violence:   . Fear of Current or Ex-Partner:   . Emotionally Abused:   Marland Kitchen Physically Abused:   . Sexually Abused:      OBSERVATIONS/OBJECTIVE:  BP (!) 149/91 (BP Location: Left Arm, Patient Position: Sitting)   Pulse 86   Temp 97.8 F (36.6 C) (Temporal)   Resp 18   Ht _0  (1.651 m)   Wt 182 lb (82.6 kg)   SpO2 99%   BMI 30.29 kg/m   GENERAL: Patient Thomas a well appearing female in no acute distress HEENT:  Sclerae anicteric.  Oropharynx clear and moist. No ulcerations or evidence of oropharyngeal candidiasis. Neck Thomas supple.  NODES:  No cervical, supraclavicular, or axillary lymphadenopathy palpated.  BREAST EXAM:  Right breast s/p lumpectomy x 2 and radiation, no sign of local recurrence, left breast Thomas benign LUNGS:  Clear to auscultation bilaterally.  No wheezes or rhonchi. HEART:  Regular rate and rhythm. No murmur appreciated. ABDOMEN:  Soft, nontender.  Positive,  normoactive bowel sounds. No organomegaly palpated. MSK:  No focal spinal tenderness to palpation. Full range of motion bilaterally in the upper extremities. EXTREMITIES:  No peripheral edema.   SKIN:  Clear with no obvious rashes or skin changes. No nail dyscrasia. NEURO:  Nonfocal. Well oriented.  Appropriate affect.    LABORATORY DATA:  None for this visit.  DIAGNOSTIC IMAGING:  None for this visit.      ASSESSMENT AND PLAN:  Judith Thomas Thomas a pleasant 64 y.o. female with Stage IA right breast invasive ductal carcinoma, ER+/PR+/HER2+, diagnosed in 09/2018, treated with neoadjuvant chemotherapy,  lumpectomy, adjuvant radiation therapy, and anti-estrogen therapy with Anastrozole beginning in 09/2019.  She presents to the Survivorship Clinic for our initial meeting and routine follow-up post-completion of treatment for breast cancer.    1. Stage IA right breast cancer:  Judith Thomas continuing to recover from definitive treatment for breast cancer. She will follow-up with her medical oncologist, Dr. Lindi Adie in 07/2020 with history and physical exam per surveillance protocol.  She will continue her anti-estrogen therapy with Anastrozole. Thus far, she Thomas tolerating the Anastrozole well, with minimal side effects. She was instructed to make Dr. Lindi Adie or myself aware if she begins to experience any worsening side effects of the medication and I could see her  back in clinic to help manage those side effects, as needed. Her mammogram Thomas due 09/2020; orders placed today. Today, a comprehensive survivorship care plan and treatment summary was reviewed with the patient today detailing her breast cancer diagnosis, treatment course, potential late/long-term effects of treatment, appropriate follow-up care with recommendations for the future, and patient education resources.  A copy of this summary, along with a letter will be sent to the patient's primary care provider via mail/fax/In Basket message after today's visit.    2. Bone health:  Given Judith Thomas age/history of breast cancer and her current treatment regimen including anti-estrogen therapy with Anastrozole, she Thomas at risk for bone demineralization.  Her last DEXA scan was in 11/2019, which was normal.  She was recommended to repeat her bone density testing in 11/2021.  In the meantime, she was encouraged to increase her consumption of foods rich in calcium, as well as increase her weight-bearing activities.  She was given education on specific activities to promote bone health.  3. Cancer screening:  Due to Judith Thomas history and her age, she should receive screening for skin cancers, colon cancer, and gynecologic cancers.  The information and recommendations are listed on the patient's comprehensive care plan/treatment summary and were reviewed in detail with the patient.    4. Health maintenance and wellness promotion: Judith Thomas was encouraged to consume 5-7 servings of fruits and vegetables per day. We reviewed the "Nutrition Rainbow" handout, as well as the handout "Take Control of Your Health and Reduce Your Cancer Risk" from the [employer].  She was also encouraged to engage in moderate to vigorous exercise for 30 minutes per day most days of the week. We discussed the [employer] fitness program, which Thomas designed for cancer survivors to help them become more physically fit after cancer  treatments.  She was instructed to limit her alcohol consumption and continue to abstain from tobacco use.     5. Support services/counseling: It Thomas not uncommon for this period of the patient's cancer care trajectory to be one of many emotions and stressors.  We discussed how this can be increasingly difficult during the times of quarantine and social  distancing due to the COVID-19 pandemic.   She was given information regarding our available services and encouraged to contact me with any questions or for help enrolling in any of our support group/programs.    Follow up instructions:    -Return to cancer center 07/2020  -Mammogram due in 09/2019 -Bone density in 11/2021 -Follow up with surgery 01/2020 -She Thomas welcome to return back to the Survivorship Clinic at any time; no additional follow-up needed at this time.  -Consider referral back to survivorship as a long-term survivor for continued surveillance  The patient was provided an opportunity to ask questions and all were answered. The patient agreed with the plan and demonstrated an understanding of the instructions.   Total encounter time: 30 minutes  Wilber Bihari, NP 01/06/20 10:44 AM Medical Oncology and Hematology [facility] [place], [place] [address] Tel. [phone]    Fax. [phone]  *Total Encounter Time as defined by the Centers for Medicare and Medicaid Services includes, in addition to the face-to-face time of a patient visit (documented in the note above) non-face-to-face time: obtaining and reviewing outside history, ordering and reviewing medications, tests or procedures, care coordination (communications with other health care professionals or caregivers) and documentation in the medical record.

## 2020-01-06 NOTE — Telephone Encounter (Signed)
Faxed received and given to Wilber Bihari NP

## 2020-02-06 ENCOUNTER — Other Ambulatory Visit: Payer: Self-pay | Admitting: Medical

## 2020-02-17 ENCOUNTER — Encounter: Payer: Self-pay | Admitting: Hematology and Oncology

## 2020-07-02 IMAGING — DX DG CHEST 2V
2 series · 2 of 2 positions shown · non-contrast
Comparison: Radiographs July 28, 2015.

CLINICAL DATA: Annual physical exam.

EXAM:
CHEST - 2 VIEW

[chest pa]
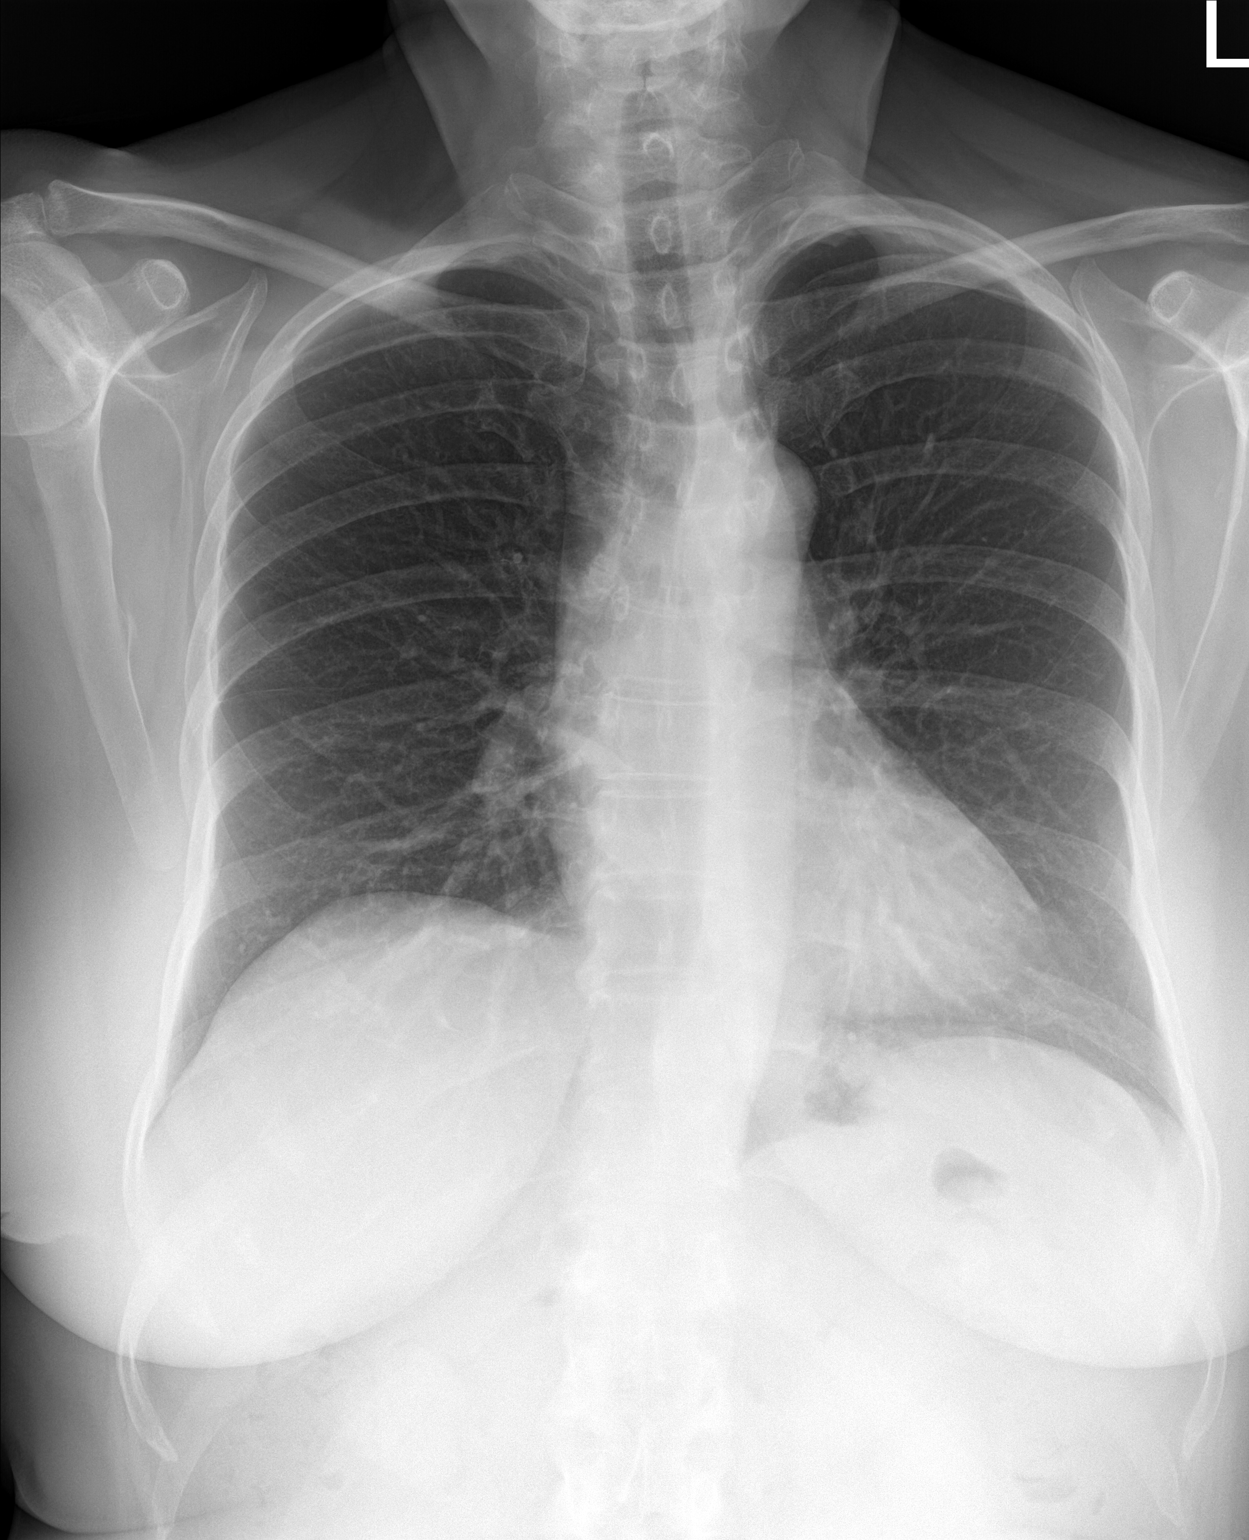

[chest lat]
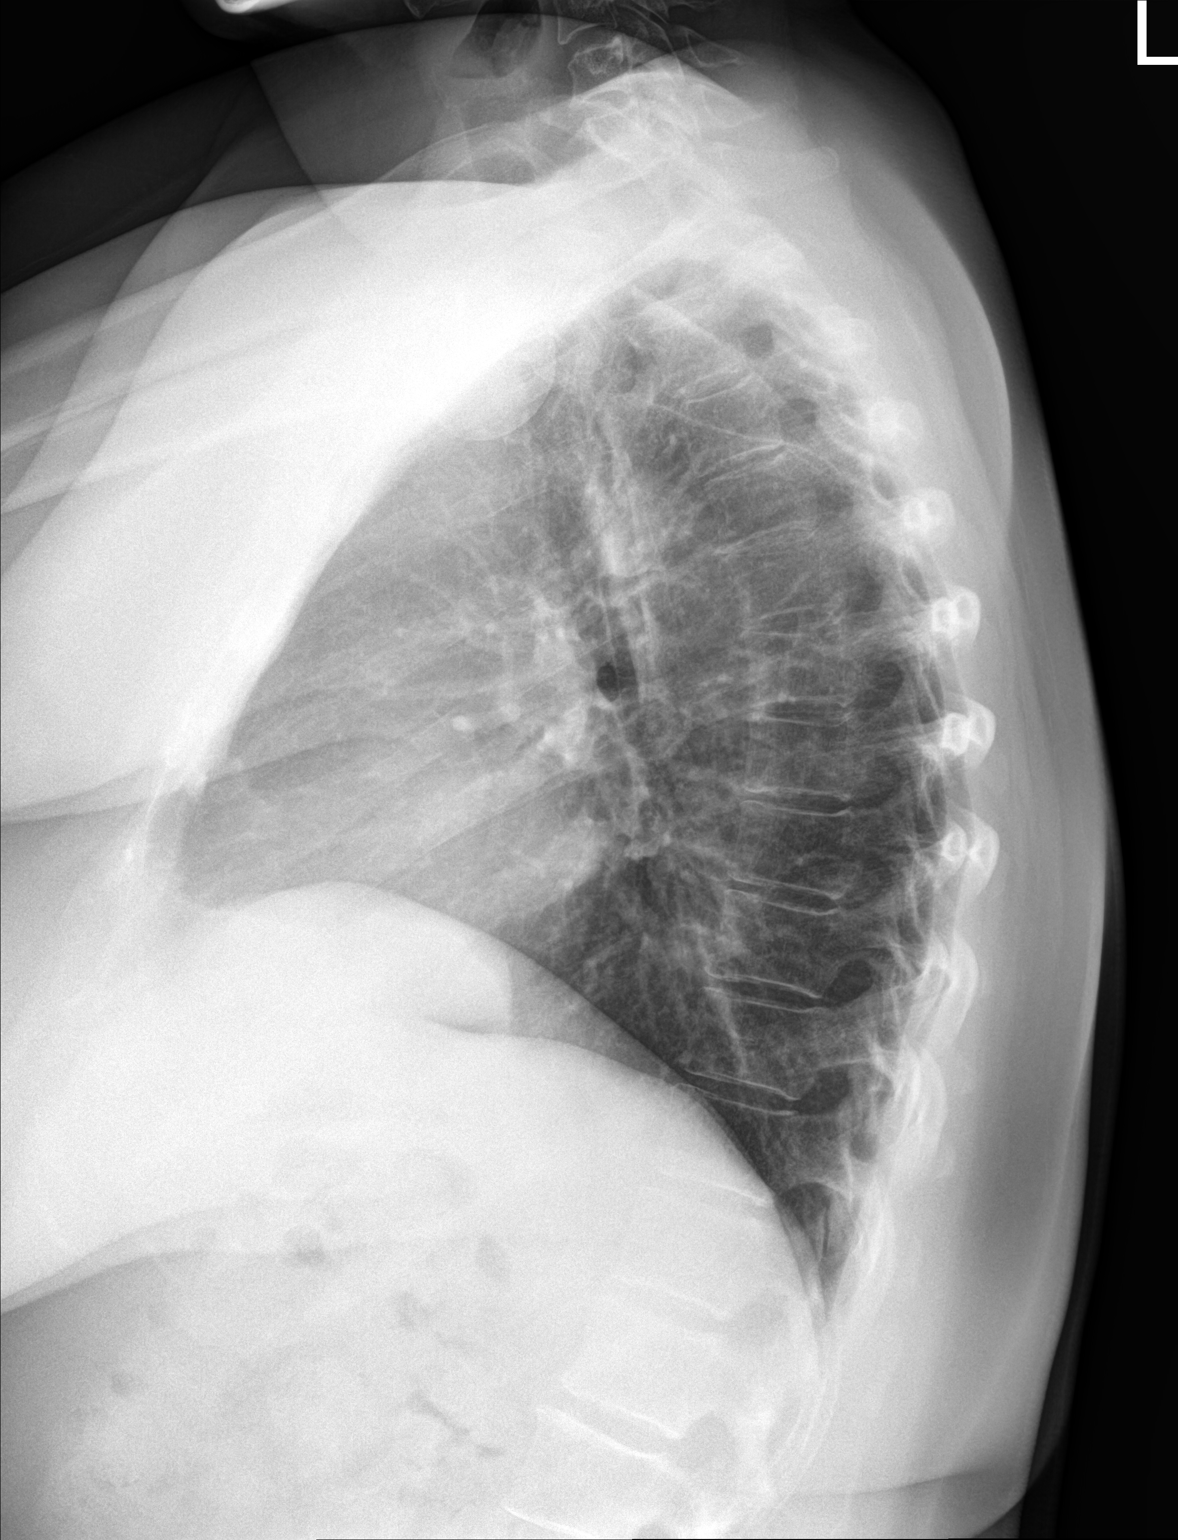

[2 of 2 positions shown; findings below may reference images not displayed]

FINDINGS: The heart size and mediastinal contours are within normal limits.
Both lungs are clear. The visualized skeletal structures are
unremarkable.
IMPRESSION: No active cardiopulmonary disease.

## 2020-07-12 NOTE — Progress Notes (Signed)
Patient Care Team: Chevis Pretty, FNP as PCP - General (Nurse Practitioner) Mauro Kaufmann, RN as Oncology Nurse Navigator Rockwell Germany, RN as Oncology Nurse Navigator Eppie Gibson, MD as Attending Physician (Radiation Oncology) Nicholas Lose, MD as Consulting Physician (Hematology and Oncology) Jovita Kussmaul, MD as Consulting Physician (General Surgery) Gwyndolyn Kaufman, RN as Registered Nurse  DIAGNOSIS:    ICD-10-CM   1. Malignant neoplasm of upper-inner quadrant of right breast in female, estrogen receptor positive ([place])  C50.211    Z17.0     SUMMARY OF ONCOLOGIC HISTORY: Oncology History  Malignant neoplasm of upper-inner quadrant of right breast in female, estrogen receptor positive ([place])  09/26/2018 Initial Diagnosis   Two right breast masses 12:00 to 1 o'clock position 1.3 cm and 1.2 cm, they are 1.5 cm apart.  Biopsy revealed grade 2 invasive ductal carcinoma ER 70%- 100%, PR 0% -20%, Ki-67 20%, HER-2 3+ by IHC and FISH ratio 2.15 with a gene copy number of 4.2 for the tumor that was 2+ by IHC, T1c N0 stage Ia   10/24/2018 Cancer Staging   Staging form: Breast, AJCC 8th Edition - Clinical stage from 10/24/2018: Stage IA (cT1c(2), cN0, cM0, G2, ER+, PR+, HER2+) - Signed by Nicholas Lose, MD on 10/24/2018   11/15/2018 - 10/24/2019 Chemotherapy   PACLitaxel-protein bound (ABRAXANE) chemo infusion 150 mg, 80 mg/m2 = 150 mg (100 % of original dose 80 mg/m2), Intravenous,  Once, 3 of 3 cycles. Dose modification: 80 mg/m2 (original dose 80 mg/m2, Cycle 1). Administration: 150 mg (11/29/2018), 150 mg (12/06/2018), 150 mg (12/13/2018), 150 mg (12/20/2018), 150 mg (12/27/2018), 150 mg (01/03/2019), 150 mg (01/10/2019), 150 mg (01/17/2019), 150 mg (01/24/2019)  ondansetron (ZOFRAN) 8 mg in sodium chloride 0.9 % 50 mL IVPB, 8 mg (100 % of original dose 8 mg), Intravenous,  Once, 1 of 1 cycle. Dose modification: 8 mg (original dose 8 mg, Cycle 1)  trastuzumab (HERCEPTIN) 336 mg in  sodium chloride 0.9 % 250 mL chemo infusion, 4 mg/kg = 336 mg, Intravenous,  Once, 5 of 16 cycles. Dose modification: 6 mg/kg (original dose 2 mg/kg, Cycle 3, Reason: Other (see comments), Comment: changing to q3week tx). Administration: 336 mg (11/08/2018), 168 mg (11/15/2018), 168 mg (12/06/2018), 504 mg (02/14/2019), 504 mg (03/05/2019), 168 mg (11/22/2018), 168 mg (11/29/2018), 168 mg (12/13/2018), 168 mg (12/20/2018), 168 mg (12/27/2018), 168 mg (01/03/2019), 168 mg (01/10/2019), 168 mg (01/17/2019), 504 mg (01/24/2019)  PACLitaxel (TAXOL) 156 mg in sodium chloride 0.9 % 250 mL chemo infusion (</= $RemoveBefor'80mg'XOunFAfNfSXC$ /m2), 80 mg/m2 = 156 mg, Intravenous,  Once, 1 of 1 cycle. Administration: 156 mg (11/08/2018), 156 mg (11/15/2018), 156 mg (11/22/2018)  ado-trastuzumab emtansine (KADCYLA) 300 mg in sodium chloride 0.9 % 250 mL chemo infusion, 3.6 mg/kg = 300 mg, Intravenous, Once, 11 of 11 cycles. Administration: 300 mg (03/28/2019), 300 mg (04/18/2019), 300 mg (06/21/2019), 300 mg (05/09/2019), 300 mg (05/30/2019), 300 mg (07/11/2019), 300 mg (08/01/2019), 300 mg (08/22/2019), 300 mg (09/12/2019), 260 mg (10/02/2019), 260 mg (10/24/2019)    01/09/2019 Genetic Testing   Negative genetic testing on the common hereditary cancer panel. The Hereditary Gene Panel offered by [facility] includes sequencing and/or deletion duplication testing of the following 48 genes: APC, ATM, AXIN2, BARD1, BMPR1A, BRCA1, BRCA2, BRIP1, CDH1, CDK4, CDKN2A (p14ARF), CDKN2A (p16INK4a), CHEK2, CTNNA1, DICER1, EPCAM (Deletion/duplication testing only), GREM1 (promoter region deletion/duplication testing only), KIT, MEN1, MLH1, MSH2, MSH3, MSH6, MUTYH, NBN, NF1, NHTL1, PALB2, PDGFRA, PMS2, POLD1, POLE, PTEN, RAD50, RAD51C, RAD51D, RNF43, SDHB,  SDHC, SDHD, SMAD4, SMARCA4. STK11, TP53, TSC1, TSC2, and VHL.  The following genes were evaluated for sequence changes only: SDHA and HOXB13 c.251G>A variant only. The report date is January 09, 2019.   03/08/2019 Surgery   Right lumpectomy  [name]) [phone]): IDC s/p neoadjuvant treatment, grade 2, 0.7cm, ER+ 100%, PR+ 5%, HER2 equivocal, Ki67 20%, 2 right axillary sentinel lymph nodes negative for carcinoma, with clear margins.    03/29/2019 Cancer Staging   Staging form: Breast, AJCC 8th Edition - Pathologic stage from 03/29/2019: No Stage Recommended (ypT1b, pN0, cM0, G2, ER+, PR+, HER2: Equivocal)     04/08/2019 - 05/06/2019 Radiation Therapy   The patient initially received a dose of 40.05 Gy in 15 fractions to the breast using whole-breast tangent fields. This was delivered using a 3-D conformal technique. The pt received a boost delivering an additional 10 Gy in 5 fractions using a electron boost with 46meV electrons. The total dose was 50.05 Gy.    09/2019 - 09/2024 Anti-estrogen oral therapy   Anastrozole     CHIEF COMPLIANT: Follow-up of right breast cancer on anastrozole  INTERVAL HISTORY: Judith Thomas is a 64 y.o. with above-mentioned history of right breast cancer treated with neoadjuvant chemotherapy,lumpectomy, radiation, Kadcyla maintenance, and is currently on antiestrogen therapy with anastrozole. She is a participant in the [employer] clinical trial.Mammogram on 12/25/19 showed no evidence of malignancy bilaterally. She presents to the clinic today for follow-up.  ALLERGIES:  is allergic to paclitaxel.  MEDICATIONS:  Current Outpatient Medications  Medication Sig Dispense Refill  . anastrozole (ARIMIDEX) 1 MG tablet Take 1 tablet (1 mg total) by mouth daily. 90 tablet 3  . fluticasone (FLONASE) 50 MCG/ACT nasal spray Place 1 spray into both nostrils daily as needed for allergies or rhinitis.    Marland Kitchen loratadine (CLARITIN) 10 MG tablet Take 10 mg by mouth daily.    . Multiple Vitamin (MULTIVITAMIN) tablet Take 1 tablet by mouth daily.     Vladimir Faster Glycol-Propyl Glycol (SYSTANE FREE OP) Apply to eye at bedtime as needed.     No current facility-administered medications for this visit.    PHYSICAL  EXAMINATION: ECOG PERFORMANCE STATUS: 1 - Symptomatic but completely ambulatory  Vitals:   07/13/20 1036  BP: (!) 147/74  Pulse: 77  Resp: 18  Temp: 97.9 F (36.6 C)  SpO2: 100%   Filed Weights   07/13/20 1036  Weight: 187 lb 11.2 oz (85.1 kg)    BREAST: No palpable masses or nodules in either right or left breasts. No palpable axillary supraclavicular or infraclavicular adenopathy no breast tenderness or nipple discharge. (exam performed in the presence of a chaperone)  LABORATORY DATA:  I have reviewed the data as listed CMP Latest Ref Rng & Units 10/24/2019 10/02/2019 09/12/2019  Glucose 70 - 99 mg/dL 95 96 91  BUN 8 - 23 mg/dL $Remove'13 15 14  'vDKzGpi$ Creatinine 0.44 - 1.00 mg/dL 0.70 0.73 0.76  Sodium 135 - 145 mmol/L 140 139 140  Potassium 3.5 - 5.1 mmol/L 4.1 3.8 4.3  Chloride 98 - 111 mmol/L 105 104 106  CO2 22 - 32 mmol/L $RemoveB'25 26 26  'EhVGzslL$ Calcium 8.9 - 10.3 mg/dL 8.9 9.0 9.6  Total Protein 6.5 - 8.1 g/dL 7.7 7.7 7.9  Total Bilirubin 0.3 - 1.2 mg/dL 1.0 0.9 0.7  Alkaline Phos 38 - 126 U/L 168(H) 153(H) 155(H)  AST 15 - 41 U/L 39 45(H) 41  ALT 0 - 44 U/L $Remo'27 30 28    'DeExw$ Lab Results  Component Value Date   WBC 5.4 10/24/2019   HGB 12.1 10/24/2019   HCT 38.6 10/24/2019   MCV 89.8 10/24/2019   PLT 235 10/24/2019   NEUTROABS 3.8 10/24/2019    ASSESSMENT & PLAN:  Malignant neoplasm of upper-inner quadrant of right breast in female, estrogen receptor positive ([place]) 09/27/19:Two right breast masses 12:00 to 1 o'clock position 1.3 cm and 1.2 cm, they are 1.5 cm apart. 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 Neoadjuvant chemotherapy withTaxol Herceptin weekly x12completed 4/16/2020followed by Herceptin maintenance vsKadcylafor 1 year completed 10/24/2019 2.03/08/2019: Right lumpectomy IDC s/p neoadjuvant  treatment, grade 2, 0.7cm, ER+ 100%, PR+ 5%, HER2 equivocal, Ki67 20%, 0/2 right axillary sentinel lymph nodes negative for carcinoma, with clear margins. 3. Followed by adjuvant radiation therapycompleted 05/06/2019 4.Followed by antiestrogen therapy started 10/02/2019 ------------------------------------------------------------------------------------------------------------------------------------------------------------ Current Treatment:Anastrozole 1 mg daily started 10/01/20   Anastrozole Toxicities: 1.  Mild to moderate hot flashes 2. mild to moderate joint stiffness: Improved with exercise 3.  Weight issues: Patient gained about 5 pounds and is worried about the weight gain.  We talked about intermittent fasting and dietary changes that might help her lose more weight.  I instructed her that she needs to measure the calories to keep the total calorie intake below 1800.  Breast Cancer Surveillance: 1. Breast Exam: 07/13/20: Benign 2. Mammograms: 10/01/2019: and U/S: No evidence of malignancy 5.5 cm fluid collection RTC in 1 year for follow up  No orders of the defined types were placed in this encounter.  The patient has a good understanding of the overall plan. she agrees with it. she will call with any problems that may develop before the next visit here.  Total time spent: 20 mins including face to face time and time spent for planning, charting and coordination of care  Nicholas Lose, MD 07/13/2020  I, Cloyde Reams Dorshimer, am acting as scribe for Dr. Nicholas Lose.  I have reviewed the above documentation for accuracy and completeness, and I agree with the above.

## 2020-07-13 ENCOUNTER — Other Ambulatory Visit: Payer: Self-pay

## 2020-07-13 ENCOUNTER — Inpatient Hospital Stay: Payer: BC Managed Care – PPO | Attending: Hematology and Oncology | Admitting: Hematology and Oncology

## 2020-07-13 DIAGNOSIS — Z923 Personal history of irradiation: Secondary | ICD-10-CM | POA: Diagnosis not present

## 2020-07-13 DIAGNOSIS — Z170 Estrogen receptor positive status [ER+]: Secondary | ICD-10-CM | POA: Diagnosis not present

## 2020-07-13 DIAGNOSIS — Z9221 Personal history of antineoplastic chemotherapy: Secondary | ICD-10-CM | POA: Diagnosis not present

## 2020-07-13 DIAGNOSIS — C50211 Malignant neoplasm of upper-inner quadrant of right female breast: Secondary | ICD-10-CM | POA: Diagnosis present

## 2020-07-13 NOTE — Assessment & Plan Note (Signed)
09/27/19:Two right breast masses 12:00 to 1 o'clock position 1.3 cm and 1.2 cm, they are 1.5 cm apart. 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 Neoadjuvant chemotherapy withTaxol Herceptin weekly x12completed 4/16/2020followed by Herceptin maintenance vsKadcylafor 1 year completed 10/24/2019 2.03/08/2019: Right lumpectomy IDC s/p neoadjuvant treatment, grade 2, 0.7cm, ER+ 100%, PR+ 5%, HER2 equivocal, Ki67 20%, 0/2 right axillary sentinel lymph nodes negative for carcinoma, with clear margins. 3. Followed by adjuvant radiation therapycompleted 05/06/2019 4.Followed by antiestrogen therapy started 10/02/2019 ------------------------------------------------------------------------------------------------------------------------------------------------------------ Current Treatment:Anastrozole 1 mg daily started 10/01/20   Anastrozole Toxicities:  Breast Cancer Surveillance: 1. Breast Exam: 07/13/20: Benign 2. Mammograms: 10/01/2019: and U/S: No evidence of malignancy 5.5 cm fluid collection RTC in 1 year for follow up

## 2020-07-15 ENCOUNTER — Encounter: Payer: Self-pay | Admitting: Nurse Practitioner

## 2020-07-16 ENCOUNTER — Telehealth: Payer: Self-pay | Admitting: Hematology and Oncology

## 2020-07-16 NOTE — Telephone Encounter (Signed)
Scheduled per 10/4 los. Called and spoke with pt, confirmed 10/4 appt

## 2020-07-24 ENCOUNTER — Ambulatory Visit: Payer: BC Managed Care – PPO

## 2020-08-18 ENCOUNTER — Other Ambulatory Visit (HOSPITAL_COMMUNITY)
Admission: RE | Admit: 2020-08-18 | Discharge: 2020-08-18 | Disposition: A | Discharge status: Home / Self Care | Payer: BC Managed Care – PPO | Source: Ambulatory Visit | Attending: Nurse Practitioner | Admitting: Nurse Practitioner

## 2020-08-18 ENCOUNTER — Ambulatory Visit (INDEPENDENT_AMBULATORY_CARE_PROVIDER_SITE_OTHER): Payer: BC Managed Care – PPO | Admitting: Nurse Practitioner

## 2020-08-18 ENCOUNTER — Other Ambulatory Visit: Payer: Self-pay

## 2020-08-18 ENCOUNTER — Ambulatory Visit (INDEPENDENT_AMBULATORY_CARE_PROVIDER_SITE_OTHER): Payer: BC Managed Care – PPO

## 2020-08-18 ENCOUNTER — Encounter: Payer: Self-pay | Admitting: Nurse Practitioner

## 2020-08-18 VITALS — BP 136/82 | HR 65 | Temp 97.5°F | Resp 20 | Ht 65.0 in | Wt 182.0 lb

## 2020-08-18 DIAGNOSIS — Z170 Estrogen receptor positive status [ER+]: Secondary | ICD-10-CM

## 2020-08-18 DIAGNOSIS — Z0001 Encounter for general adult medical examination with abnormal findings: Secondary | ICD-10-CM

## 2020-08-18 DIAGNOSIS — Z6828 Body mass index (BMI) 28.0-28.9, adult: Secondary | ICD-10-CM | POA: Diagnosis not present

## 2020-08-18 DIAGNOSIS — Z0000 Encounter for general adult medical examination without abnormal findings: Secondary | ICD-10-CM | POA: Diagnosis present

## 2020-08-18 DIAGNOSIS — C50211 Malignant neoplasm of upper-inner quadrant of right female breast: Secondary | ICD-10-CM | POA: Diagnosis not present

## 2020-08-18 LAB — URINALYSIS, COMPLETE
Bilirubin, UA: NEGATIVE
Glucose, UA: NEGATIVE
Ketones, UA: NEGATIVE
Nitrite, UA: NEGATIVE
Protein,UA: NEGATIVE
RBC, UA: NEGATIVE
Specific Gravity, UA: 1.025 (ref 1.005–1.030)
Urobilinogen, Ur: 1 mg/dL (ref 0.2–1.0)
pH, UA: 7 (ref 5.0–7.5)

## 2020-08-18 LAB — MICROSCOPIC EXAMINATION

## 2020-08-18 NOTE — Patient Instructions (Signed)
DASH Eating Plan DASH stands for "Dietary Approaches to Stop Hypertension." The DASH eating plan is a healthy eating plan that has been shown to reduce high blood pressure (hypertension). It may also reduce your risk for type 2 diabetes, heart disease, and stroke. The DASH eating plan may also help with weight loss. What are tips for following this plan?  General guidelines  Avoid eating more than 2,300 mg (milligrams) of salt (sodium) a day. If you have hypertension, you may need to reduce your sodium intake to 1,500 mg a day.  Limit alcohol intake to no more than 1 drink a day for nonpregnant women and 2 drinks a day for men. One drink equals 12 oz of beer, 5 oz of wine, or 1 oz of hard liquor.  Work with your health care provider to maintain a healthy body weight or to lose weight. Ask what an ideal weight is for you.  Get at least 30 minutes of exercise that causes your heart to beat faster (aerobic exercise) most days of the week. Activities may include walking, swimming, or biking.  Work with your health care provider or diet and nutrition specialist (dietitian) to adjust your eating plan to your individual calorie needs. Reading food labels   Check food labels for the amount of sodium per serving. Choose foods with less than 5 percent of the Daily Value of sodium. Generally, foods with less than 300 mg of sodium per serving fit into this eating plan.  To find whole grains, look for the word "whole" as the first word in the ingredient list. Shopping  Buy products labeled as "low-sodium" or "no salt added."  Buy fresh foods. Avoid canned foods and premade or frozen meals. Cooking  Avoid adding salt when cooking. Use salt-free seasonings or herbs instead of table salt or sea salt. Check with your health care provider or pharmacist before using salt substitutes.  Do not fry foods. Cook foods using healthy methods such as baking, boiling, grilling, and broiling instead.  Cook with  heart-healthy oils, such as olive, canola, soybean, or sunflower oil. Meal planning  Eat a balanced diet that includes: ? 5 or more servings of fruits and vegetables each day. At each meal, try to fill half of your plate with fruits and vegetables. ? Up to 6-8 servings of whole grains each day. ? Less than 6 oz of lean meat, poultry, or fish each day. A 3-oz serving of meat is about the same size as a deck of cards. One egg equals 1 oz. ? 2 servings of low-fat dairy each day. ? A serving of nuts, seeds, or beans 5 times each week. ? Heart-healthy fats. Healthy fats called Omega-3 fatty acids are found in foods such as flaxseeds and coldwater fish, like sardines, salmon, and mackerel.  Limit how much you eat of the following: ? Canned or prepackaged foods. ? Food that is high in trans fat, such as fried foods. ? Food that is high in saturated fat, such as fatty meat. ? Sweets, desserts, sugary drinks, and other foods with added sugar. ? Full-fat dairy products.  Do not salt foods before eating.  Try to eat at least 2 vegetarian meals each week.  Eat more home-cooked food and less restaurant, buffet, and fast food.  When eating at a restaurant, ask that your food be prepared with less salt or no salt, if possible. What foods are recommended? The items listed may not be a complete list. Talk with your dietitian about   what dietary choices are best for you. Grains Whole-grain or whole-wheat bread. Whole-grain or whole-wheat pasta. Brown rice. Oatmeal. Quinoa. Bulgur. Whole-grain and low-sodium cereals. Pita bread. Low-fat, low-sodium crackers. Whole-wheat flour tortillas. Vegetables Fresh or frozen vegetables (raw, steamed, roasted, or grilled). Low-sodium or reduced-sodium tomato and vegetable juice. Low-sodium or reduced-sodium tomato sauce and tomato paste. Low-sodium or reduced-sodium canned vegetables. Fruits All fresh, dried, or frozen fruit. Canned fruit in natural juice (without  added sugar). Meat and other protein foods Skinless chicken or turkey. Ground chicken or turkey. Pork with fat trimmed off. Fish and seafood. Egg whites. Dried beans, peas, or lentils. Unsalted nuts, nut butters, and seeds. Unsalted canned beans. Lean cuts of beef with fat trimmed off. Low-sodium, lean deli meat. Dairy Low-fat (1%) or fat-free (skim) milk. Fat-free, low-fat, or reduced-fat cheeses. Nonfat, low-sodium ricotta or cottage cheese. Low-fat or nonfat yogurt. Low-fat, low-sodium cheese. Fats and oils Soft margarine without trans fats. Vegetable oil. Low-fat, reduced-fat, or light mayonnaise and salad dressings (reduced-sodium). Canola, safflower, olive, soybean, and sunflower oils. Avocado. Seasoning and other foods Herbs. Spices. Seasoning mixes without salt. Unsalted popcorn and pretzels. Fat-free sweets. What foods are not recommended? The items listed may not be a complete list. Talk with your dietitian about what dietary choices are best for you. Grains Baked goods made with fat, such as croissants, muffins, or some breads. Dry pasta or rice meal packs. Vegetables Creamed or fried vegetables. Vegetables in a cheese sauce. Regular canned vegetables (not low-sodium or reduced-sodium). Regular canned tomato sauce and paste (not low-sodium or reduced-sodium). Regular tomato and vegetable juice (not low-sodium or reduced-sodium). Pickles. Olives. Fruits Canned fruit in a light or heavy syrup. Fried fruit. Fruit in cream or butter sauce. Meat and other protein foods Fatty cuts of meat. Ribs. Fried meat. Bacon. Sausage. Bologna and other processed lunch meats. Salami. Fatback. Hotdogs. Bratwurst. Salted nuts and seeds. Canned beans with added salt. Canned or smoked fish. Whole eggs or egg yolks. Chicken or turkey with skin. Dairy Whole or 2% milk, cream, and half-and-half. Whole or full-fat cream cheese. Whole-fat or sweetened yogurt. Full-fat cheese. Nondairy creamers. Whipped toppings.  Processed cheese and cheese spreads. Fats and oils Butter. Stick margarine. Lard. Shortening. Ghee. Bacon fat. Tropical oils, such as coconut, palm kernel, or palm oil. Seasoning and other foods Salted popcorn and pretzels. Onion salt, garlic salt, seasoned salt, table salt, and sea salt. Worcestershire sauce. Tartar sauce. Barbecue sauce. Teriyaki sauce. Soy sauce, including reduced-sodium. Steak sauce. Canned and packaged gravies. Fish sauce. Oyster sauce. Cocktail sauce. Horseradish that you find on the shelf. Ketchup. Mustard. Meat flavorings and tenderizers. Bouillon cubes. Hot sauce and Tabasco sauce. Premade or packaged marinades. Premade or packaged taco seasonings. Relishes. Regular salad dressings. Where to find more information:  National Heart, Lung, and Blood Institute: www.nhlbi.nih.gov  American Heart Association: www.heart.org Summary  The DASH eating plan is a healthy eating plan that has been shown to reduce high blood pressure (hypertension). It may also reduce your risk for type 2 diabetes, heart disease, and stroke.  With the DASH eating plan, you should limit salt (sodium) intake to 2,300 mg a day. If you have hypertension, you may need to reduce your sodium intake to 1,500 mg a day.  When on the DASH eating plan, aim to eat more fresh fruits and vegetables, whole grains, lean proteins, low-fat dairy, and heart-healthy fats.  Work with your health care provider or diet and nutrition specialist (dietitian) to adjust your eating plan to your   individual calorie needs. This information is not intended to replace advice given to you by your health care provider. Make sure you discuss any questions you have with your health care provider. Document Revised: 09/08/2017 Document Reviewed: 09/19/2016 Elsevier Patient Education  2020 Elsevier Inc.  

## 2020-08-18 NOTE — Progress Notes (Signed)
Subjective:    Patient ID: Judith Thomas, female    DOB: 1956-02-08, 64 y.o.   MRN: [id number]   Chief Complaint: Annual Exam    HPI:  1. Annual physical exam Last pap was done 1 year ago and was abnormal ( ASCUS with negative HPV )   2. Malignant neoplasm of upper-inner quadrant of right breast in female, estrogen receptor positive ([place]) completely done with treatment. Is still taking anastrasol daily and see oncology every 6 months.  3. BMI 28.0-28.9,adult Weight is down 5lbs from last visit Wt Readings from Last 3 Encounters:  08/18/20 182 lb (82.6 kg)  07/13/20 187 lb 11.2 oz (85.1 kg)  01/06/20 182 lb (82.6 kg)    BMI Readings from Last 3 Encounters:  08/18/20 30.29 kg/m  07/13/20 31.23 kg/m  01/06/20 30.29 kg/m      Outpatient Encounter Medications as of 08/18/2020  Medication Sig   anastrozole (ARIMIDEX) 1 MG tablet Take 1 tablet (1 mg total) by mouth daily.   Multiple Vitamin (MULTIVITAMIN) tablet Take 1 tablet by mouth daily.    Polyethyl Glycol-Propyl Glycol (SYSTANE FREE OP) Apply to eye at bedtime as needed.   No facility-administered encounter medications on file as of 08/18/2020.    Past Surgical History:  Procedure Laterality Date   BREAST LUMPECTOMY WITH RADIOACTIVE SEED AND SENTINEL LYMPH NODE BIOPSY Right 03/08/2019   Procedure: RIGHT BREAST RADIOACTIVE SEED LUMPECTOMY X2 AND SENTINEL LYMPH NODE BIOPSY;  Surgeon: Jovita Kussmaul, MD;  Location: [facility];  Service: General;  Laterality: Right;   COLONOSCOPY  03/2010   none     PORT-A-CATH REMOVAL Left 12/30/2019   Procedure: REMOVAL PORT-A-CATH;  Surgeon: Jovita Kussmaul, MD;  Location: [facility];  Service: General;  Laterality: Left;   PORTACATH PLACEMENT Left 11/07/2018   Procedure: INSERTION PORT-A-CATH WITH ULTRASOUND;  Surgeon: Jovita Kussmaul, MD;  Location: [facility];  Service: General;  Laterality: Left;    Family History  Problem  Relation Age of Onset   Liver disease Mother    Heart disease Mother        afib   Heart disease Father    Asthma Father    Diabetes Father    Bladder Cancer Father        smoker   Breast cancer Maternal Aunt    Heart disease Maternal Grandfather    Kidney disease Paternal Grandmother    Heart disease Paternal Grandfather    Heart disease Other    Breast cancer Maternal Aunt        mets to bone    New complaints: None today  Social history: Lives with her husband  Controlled substance contract: n/a    Review of Systems  Constitutional: Negative for diaphoresis.  Eyes: Negative for pain.  Respiratory: Negative for shortness of breath.   Cardiovascular: Negative for chest pain, palpitations and leg swelling.  Gastrointestinal: Negative for abdominal pain.  Endocrine: Negative for polydipsia.  Skin: Negative for rash.  Neurological: Negative for dizziness, weakness and headaches.  Hematological: Does not bruise/bleed easily.  All other systems reviewed and are negative.      Objective:   Physical Exam Vitals and nursing note reviewed.  Constitutional:      General: She is not in acute distress.    Appearance: Normal appearance. She is well-developed.  HENT:     Head: Normocephalic.     Nose: Nose normal.  Eyes:     Pupils: Pupils  are equal, round, and reactive to light.  Neck:     Vascular: No carotid bruit or JVD.  Cardiovascular:     Rate and Rhythm: Normal rate and regular rhythm.     Heart sounds: Normal heart sounds.  Pulmonary:     Effort: Pulmonary effort is normal. No respiratory distress.     Breath sounds: Normal breath sounds. No wheezing or rales.  Chest:     Chest wall: No tenderness.  Abdominal:     General: Bowel sounds are normal. There is no distension or abdominal bruit.     Palpations: Abdomen is soft. There is no hepatomegaly, splenomegaly, mass or pulsatile mass.     Tenderness: There is no abdominal tenderness.   Genitourinary:    General: Normal vulva.     Vagina: Vaginal discharge present.     Rectum: Normal.     Comments: No adnexal masses or tenderness Small cystocele Musculoskeletal:        General: Normal range of motion.     Cervical back: Normal range of motion and neck supple.  Lymphadenopathy:     Cervical: No cervical adenopathy.  Skin:    General: Skin is warm and dry.  Neurological:     Mental Status: She is alert and oriented to person, place, and time.     Deep Tendon Reflexes: Reflexes are normal and symmetric.  Psychiatric:        Behavior: Behavior normal.        Thought Content: Thought content normal.        Judgment: Judgment normal.    BP 136/82 (BP Location: Left Arm, Cuff Size: Normal)    Pulse 65    Temp (!) 97.5 F (36.4 C) (Temporal)    Resp 20    Ht $R'5\' 5"'sZ$  (1.651 m)    Wt 182 lb (82.6 kg)    SpO2 98%    BMI 30.29 kg/m    Chest xray- no acute or chronic findings-Preliminary reading by Ronnald Collum, FNP  2020 [facility]       Assessment & Plan:  Kaylie Ritter comes in today with chief complaint of Annual Exam   Diagnosis and orders addressed:  1. Annual physical exam - CBC with Differential/Platelet - CMP14+EGFR - Lipid panel - Thyroid Panel With TSH - Urinalysis, Complete  2. Malignant neoplasm of upper-inner quadrant of right breast in female, estrogen receptor positive ([place]) - DG Chest 2 View  3. BMI 28.0-28.9,adult Discussed diet and exercise for person with BMI >25 Will recheck weight in 3-6 months    Labs pending Health Maintenance reviewed Diet and exercise encouraged  Follow up plan: prn   [name] Hassell Done, FNP

## 2020-08-19 LAB — CBC WITH DIFFERENTIAL/PLATELET
Basophils Absolute: 0 10*3/uL (ref 0.0–0.2)
Basos: 1 %
EOS (ABSOLUTE): 0.1 10*3/uL (ref 0.0–0.4)
Eos: 2 %
Hematocrit: 39.7 % (ref 34.0–46.6)
Hemoglobin: 13.4 g/dL (ref 11.1–15.9)
Immature Grans (Abs): 0 10*3/uL (ref 0.0–0.1)
Immature Granulocytes: 0 %
Lymphocytes Absolute: 1.4 10*3/uL (ref 0.7–3.1)
Lymphs: 24 %
MCH: 31.8 pg (ref 26.6–33.0)
MCHC: 33.8 g/dL (ref 31.5–35.7)
MCV: 94 fL (ref 79–97)
Monocytes Absolute: 0.5 10*3/uL (ref 0.1–0.9)
Monocytes: 8 %
Neutrophils Absolute: 4 10*3/uL (ref 1.4–7.0)
Neutrophils: 65 %
Platelets: 207 10*3/uL (ref 150–450)
RBC: 4.22 x10E6/uL (ref 3.77–5.28)
RDW: 12.8 % (ref 11.7–15.4)
WBC: 6 10*3/uL (ref 3.4–10.8)

## 2020-08-19 LAB — LIPID PANEL
Chol/HDL Ratio: 2.5 ratio (ref 0.0–4.4)
Cholesterol, Total: 152 mg/dL (ref 100–199)
HDL: 60 mg/dL (ref >39)
LDL Chol Calc (NIH): 74 mg/dL (ref 0–99)
Triglycerides: 96 mg/dL (ref 0–149)
VLDL Cholesterol Cal: 18 mg/dL (ref 5–40)

## 2020-08-19 LAB — CMP14+EGFR
ALT: 17 IU/L (ref 0–32)
AST: 18 IU/L (ref 0–40)
Albumin/Globulin Ratio: 1.4 (ref 1.2–2.2)
Albumin: 4.4 g/dL (ref 3.8–4.8)
Alkaline Phosphatase: 161 IU/L — ABNORMAL HIGH (ref 44–121)
BUN/Creatinine Ratio: 11 — ABNORMAL LOW (ref 12–28)
BUN: 9 mg/dL (ref 8–27)
Bilirubin Total: 0.7 mg/dL (ref 0.0–1.2)
CO2: 26 mmol/L (ref 20–29)
Calcium: 9.8 mg/dL (ref 8.7–10.3)
Chloride: 101 mmol/L (ref 96–106)
Creatinine, Ser: 0.79 mg/dL (ref 0.57–1.00)
GFR calc Af Amer: 92 mL/min/{1.73_m2} (ref >59)
GFR calc non Af Amer: 80 mL/min/{1.73_m2} (ref >59)
Globulin, Total: 3.1 g/dL (ref 1.5–4.5)
Glucose: 84 mg/dL (ref 65–99)
Potassium: 5 mmol/L (ref 3.5–5.2)
Sodium: 142 mmol/L (ref 134–144)
Total Protein: 7.5 g/dL (ref 6.0–8.5)

## 2020-08-19 LAB — THYROID PANEL WITH TSH
Free Thyroxine Index: 1.8 (ref 1.2–4.9)
T3 Uptake Ratio: 27 % (ref 24–39)
T4, Total: 6.8 ug/dL (ref 4.5–12.0)
TSH: 1.4 u[IU]/mL (ref 0.450–4.500)

## 2020-08-19 LAB — CYTOLOGY - PAP: Diagnosis: NEGATIVE

## 2020-08-20 ENCOUNTER — Other Ambulatory Visit: Payer: Self-pay | Admitting: Nurse Practitioner

## 2020-08-20 ENCOUNTER — Ambulatory Visit (HOSPITAL_COMMUNITY)
Admission: RE | Admit: 2020-08-20 | Discharge: 2020-08-20 | Disposition: A | Discharge status: Home / Self Care | Payer: BC Managed Care – PPO | Source: Ambulatory Visit | Attending: Pulmonary Disease | Admitting: Pulmonary Disease

## 2020-08-20 DIAGNOSIS — Z609 Problem related to social environment, unspecified: Secondary | ICD-10-CM

## 2020-08-20 DIAGNOSIS — U071 COVID-19: Secondary | ICD-10-CM | POA: Diagnosis present

## 2020-08-20 MED ORDER — FAMOTIDINE IN NACL 20-0.9 MG/50ML-% IV SOLN: 20.0000 mg | Freq: Once | INTRAVENOUS | Status: DC | PRN

## 2020-08-20 MED ORDER — SODIUM CHLORIDE 0.9 % IV SOLN: INTRAVENOUS | Status: DC | PRN

## 2020-08-20 MED ORDER — SOTROVIMAB 500 MG/8ML IV SOLN
500.0000 mg | Freq: Once | INTRAVENOUS | Status: AC
  Administered 2020-08-20: 500 mg via INTRAVENOUS

## 2020-08-20 MED ORDER — DIPHENHYDRAMINE HCL 50 MG/ML IJ SOLN: 50.0000 mg | Freq: Once | INTRAMUSCULAR | Status: DC | PRN

## 2020-08-20 MED ORDER — ALBUTEROL SULFATE HFA 108 (90 BASE) MCG/ACT IN AERS: 2.0000 | INHALATION_SPRAY | Freq: Once | RESPIRATORY_TRACT | Status: DC | PRN

## 2020-08-20 MED ORDER — METHYLPREDNISOLONE SODIUM SUCC 125 MG IJ SOLR: 125.0000 mg | Freq: Once | INTRAMUSCULAR | Status: DC | PRN

## 2020-08-20 MED ORDER — EPINEPHRINE 0.3 MG/0.3ML IJ SOAJ: 0.3000 mg | Freq: Once | INTRAMUSCULAR | Status: DC | PRN

## 2020-08-20 NOTE — Discharge Instructions (Signed)
10 Things You Can Do to Manage Your COVID-19 Symptoms at Home If you have possible or confirmed COVID-19: 1. Stay home from work and school. And stay away from other public places. If you must go out, avoid using any kind of public transportation, ridesharing, or taxis. 2. Monitor your symptoms carefully. If your symptoms get worse, call your healthcare provider immediately. 3. Get rest and stay hydrated. 4. If you have a medical appointment, call the healthcare provider ahead of time and tell them that you have or may have COVID-19. 5. For medical emergencies, call 911 and notify the dispatch personnel that you have or may have COVID-19. 6. Cover your cough and sneezes with a tissue or use the inside of your elbow. 7. Wash your hands often with soap and water for at least 20 seconds or clean your hands with an alcohol-based hand sanitizer that contains at least 60% alcohol. 8. As much as possible, stay in a specific room and away from other people in your home. Also, you should use a separate bathroom, if available. If you need to be around other people in or outside of the home, wear a mask. 9. Avoid sharing personal items with other people in your household, like dishes, towels, and bedding. 10. Clean all surfaces that are touched often, like counters, tabletops, and doorknobs. Use household cleaning sprays or wipes according to the label instructions. cdc.gov/coronavirus 04/10/2019 This information is not intended to replace advice given to you by your health care provider. Make sure you discuss any questions you have with your health care provider. Document Revised: 09/12/2019 Document Reviewed: 09/12/2019 Elsevier Patient Education  2020 Else[employer] Inc.   COVID-19 COVID-19 is a respiratory infection that is caused by a virus called severe acute respiratory syndrome coronavirus 2 (SARS-CoV-2). The disease is also known as coronavirus disease or novel coronavirus. In some people, the virus may  not cause any symptoms. In others, it may cause a serious infection. The infection can get worse quickly and can lead to complications, such as:  Pneumonia, or infection of the lungs.  Acute respiratory distress syndrome or ARDS. This is a condition in which fluid build-up in the lungs prevents the lungs from filling with air and passing oxygen into the blood.  Acute respiratory failure. This is a condition in which there is not enough oxygen passing from the lungs to the body or when carbon dioxide is not passing from the lungs out of the body.  Sepsis or septic shock. This is a serious bodily reaction to an infection.  Blood clotting problems.  Secondary infections due to bacteria or fungus.  Organ failure. This is when your body's organs stop working. The virus that causes COVID-19 is contagious. This means that it can spread from person to person through droplets from coughs and sneezes (respiratory secretions). What are the causes? This illness is caused by a virus. You may catch the virus by:  Breathing in droplets from an infected person. Droplets can be spread by a person breathing, speaking, singing, coughing, or sneezing.  Touching something, like a table or a doorknob, that was exposed to the virus (contaminated) and then touching your mouth, nose, or eyes. What increases the risk? Risk for infection You are more likely to be infected with this virus if you:  Are within 6 feet (2 meters) of a person with COVID-19.  Provide care for or live with a person who is infected with COVID-19.  Spend time in crowded indoor spaces or   live in shared housing. Risk for serious illness You are more likely to become seriously ill from the virus if you:  Are 50 years of age or older. The higher your age, the more you are at risk for serious illness.  Live in a nursing home or long-term care facility.  Have cancer.  Have a long-term (chronic) disease such as: ? Chronic lung disease,  including chronic obstructive pulmonary disease or asthma. ? A long-term disease that lowers your body's ability to fight infection (immunocompromised). ? Heart disease, including heart failure, a condition in which the arteries that lead to the heart become narrow or blocked (coronary artery disease), a disease which makes the heart muscle thick, weak, or stiff (cardiomyopathy). ? Diabetes. ? Chronic kidney disease. ? Sickle cell disease, a condition in which red blood cells have an abnormal "sickle" shape. ? Liver disease.  Are obese. What are the signs or symptoms? Symptoms of this condition can range from mild to severe. Symptoms may appear any time from 2 to 14 days after being exposed to the virus. They include:  A fever or chills.  A cough.  Difficulty breathing.  Headaches, body aches, or muscle aches.  Runny or stuffy (congested) nose.  A sore throat.  New loss of taste or smell. Some people may also have stomach problems, such as nausea, vomiting, or diarrhea. Other people may not have any symptoms of COVID-19. How is this diagnosed? This condition may be diagnosed based on:  Your signs and symptoms, especially if: ? You live in an area with a COVID-19 outbreak. ? You recently traveled to or from an area where the virus is common. ? You provide care for or live with a person who was diagnosed with COVID-19. ? You were exposed to a person who was diagnosed with COVID-19.  A physical exam.  Lab tests, which may include: ? Taking a sample of fluid from the back of your nose and throat (nasopharyngeal fluid), your nose, or your throat using a swab. ? A sample of mucus from your lungs (sputum). ? Blood tests.  Imaging tests, which may include, X-rays, CT scan, or ultrasound. How is this treated? At present, there is no medicine to treat COVID-19. Medicines that treat other diseases are being used on a trial basis to see if they are effective against COVID-19. Your  health care provider will talk with you about ways to treat your symptoms. For most people, the infection is mild and can be managed at home with rest, fluids, and over-the-counter medicines. Treatment for a serious infection usually takes places in a hospital intensive care unit (ICU). It may include one or more of the following treatments. These treatments are given until your symptoms improve.  Receiving fluids and medicines through an IV.  Supplemental oxygen. Extra oxygen is given through a tube in the nose, a face mask, or a hood.  Positioning you to lie on your stomach (prone position). This makes it easier for oxygen to get into the lungs.  Continuous positive airway pressure (CPAP) or bi-level positive airway pressure (BPAP) machine. This treatment uses mild air pressure to keep the airways open. A tube that is connected to a motor delivers oxygen to the body.  Ventilator. This treatment moves air into and out of the lungs by using a tube that is placed in your windpipe.  Tracheostomy. This is a procedure to create a hole in the neck so that a breathing tube can be inserted.  Extracorporeal membrane   oxygenation (ECMO). This procedure gives the lungs a chance to recover by taking over the functions of the heart and lungs. It supplies oxygen to the body and removes carbon dioxide. Follow these instructions at home: Lifestyle  If you are sick, stay home except to get medical care. Your health care provider will tell you how long to stay home. Call your health care provider before you go for medical care.  Rest at home as told by your health care provider.  Do not use any products that contain nicotine or tobacco, such as cigarettes, e-cigarettes, and chewing tobacco. If you need help quitting, ask your health care provider.  Return to your normal activities as told by your health care provider. Ask your health care provider what activities are safe for you. General  instructions  Take over-the-counter and prescription medicines only as told by your health care provider.  Drink enough fluid to keep your urine pale yellow.  Keep all follow-up visits as told by your health care provider. This is important. How is this prevented?  There is no vaccine to help prevent COVID-19 infection. However, there are steps you can take to protect yourself and others from this virus. To protect yourself:   Do not travel to areas where COVID-19 is a risk. The areas where COVID-19 is reported change often. To identify high-risk areas and travel restrictions, check the CDC travel website: wwwnc.cdc.gov/travel/notices  If you live in, or must travel to, an area where COVID-19 is a risk, take precautions to avoid infection. ? Stay away from people who are sick. ? Wash your hands often with soap and water for 20 seconds. If soap and water are not available, use an alcohol-based hand sanitizer. ? Avoid touching your mouth, face, eyes, or nose. ? Avoid going out in public, follow guidance from your state and local health authorities. ? If you must go out in public, wear a cloth face covering or face mask. Make sure your mask covers your nose and mouth. ? Avoid crowded indoor spaces. Stay at least 6 feet (2 meters) away from others. ? Disinfect objects and surfaces that are frequently touched every day. This may include:  Counters and tables.  Doorknobs and light switches.  Sinks and faucets.  Electronics, such as phones, remote controls, keyboards, computers, and tablets. To protect others: If you have symptoms of COVID-19, take steps to prevent the virus from spreading to others.  If you think you have a COVID-19 infection, contact your health care provider right away. Tell your health care team that you think you may have a COVID-19 infection.  Stay home. Leave your house only to seek medical care. Do not use public transport.  Do not travel while you are  sick.  Wash your hands often with soap and water for 20 seconds. If soap and water are not available, use alcohol-based hand sanitizer.  Stay away from other members of your household. Let healthy household members care for children and pets, if possible. If you have to care for children or pets, wash your hands often and wear a mask. If possible, stay in your own room, separate from others. Use a different bathroom.  Make sure that all people in your household wash their hands well and often.  Cough or sneeze into a tissue or your sleeve or elbow. Do not cough or sneeze into your hand or into the air.  Wear a cloth face covering or face mask. Make sure your mask covers your nose   and mouth. Where to find more information  Centers for Disease Control and Prevention: www.cdc.gov/coronavirus/2019-ncov/index.html  World Health Organization: www.who.int/health-topics/coronavirus Contact a health care provider if:  You live in or have traveled to an area where COVID-19 is a risk and you have symptoms of the infection.  You have had contact with someone who has COVID-19 and you have symptoms of the infection. Get help right away if:  You have trouble breathing.  You have pain or pressure in your chest.  You have confusion.  You have bluish lips and fingernails.  You have difficulty waking from sleep.  You have symptoms that get worse. These symptoms may represent a serious problem that is an emergency. Do not wait to see if the symptoms will go away. Get medical help right away. Call your local emergency services (911 in the [place] Do not drive yourself to the hospital. Let the emergency medical personnel know if you think you have COVID-19. Summary  COVID-19 is a respiratory infection that is caused by a virus. It is also known as coronavirus disease or novel coronavirus. It can cause serious infections, such as pneumonia, acute respiratory distress syndrome, acute respiratory failure,  or sepsis.  The virus that causes COVID-19 is contagious. This means that it can spread from person to person through droplets from breathing, speaking, singing, coughing, or sneezing.  You are more likely to develop a serious illness if you are 50 years of age or older, have a weak immune system, live in a nursing home, or have chronic disease.  There is no medicine to treat COVID-19. Your health care provider will talk with you about ways to treat your symptoms.  Take steps to protect yourself and others from infection. Wash your hands often and disinfect objects and surfaces that are frequently touched every day. Stay away from people who are sick and wear a mask if you are sick. This information is not intended to replace advice given to you by your health care provider. Make sure you discuss any questions you have with your health care provider. Document Revised: 07/26/2019 Document Reviewed: 11/01/2018 Elsevier Patient Education  2020 Elsevier Inc.  What types of side effects do monoclonal antibody drugs cause?  Common side effects  In general, the more common side effects caused by monoclonal antibody drugs include: . Allergic reactions, such as hives or itching . Flu-like signs and symptoms, including chills, fatigue, fever, and muscle aches and pains . Nausea, vomiting . Diarrhea . Skin rashes . Low blood pressure   The CDC is recommending patients who receive monoclonal antibody treatments wait at least 90 days before being vaccinated.  Currently, there are no data on the safety and efficacy of mRNA COVID-19 vaccines in persons who received monoclonal antibodies or convalescent plasma as part of COVID-19 treatment. Based on the estimated half-life of such therapies as well as evidence suggesting that reinfection is uncommon in the 90 days after initial infection, vaccination should be deferred for at least 90 days, as a precautionary measure until additional information becomes  available, to avoid interference of the antibody treatment with vaccine-induced immune responses.  

## 2020-08-20 NOTE — Progress Notes (Signed)
  Diagnosis: COVID-19  Physician: Dr. Asencion Noble   Procedure: Allergies reviewed.  Sotrovimab administered via IV infusion after med fact sheet provided to patient and all questions answered. Discharge instructions provided to patient; all questions answered.   Complications: No immediate complications noted.  Discharge: Discharged home   Monna Fam 08/20/2020

## 2020-08-20 NOTE — Progress Notes (Signed)
I connected by phone with Judith Thomas on 08/20/2020 at 12:58 PM to discuss the potential use of a new treatment for mild to moderate COVID-19 viral infection in non-hospitalized patients.  This patient is a 64 y.o. female that meets the FDA criteria for Emergency Use Authorization of COVID monoclonal antibody casirivimab/imdevimab, bamlanivimab/eteseviamb, or sotrovimab.  Has a (+) direct SARS-CoV-2 viral test result  Has mild or moderate COVID-19   Is NOT hospitalized due to COVID-19  Is within 10 days of symptom onset  Has at least one of the high risk factor(s) for progression to severe COVID-19 and/or hospitalization as defined in EUA.  Specific high risk criteria : BMI > 25   I have spoken and communicated the following to the patient or parent/caregiver regarding COVID monoclonal antibody treatment:  1. FDA has authorized the emergency use for the treatment of mild to moderate COVID-19 in adults and pediatric patients with positive results of direct SARS-CoV-2 viral testing who are 31 years of age and older weighing at least 40 kg, and who are at high risk for progressing to severe COVID-19 and/or hospitalization.  2. The significant known and potential risks and benefits of COVID monoclonal antibody, and the extent to which such potential risks and benefits are unknown.  3. Information on available alternative treatments and the risks and benefits of those alternatives, including clinical trials.  4. Patients treated with COVID monoclonal antibody should continue to self-isolate and use infection control measures (e.g., wear mask, isolate, social distance, avoid sharing personal items, clean and disinfect "high touch" surfaces, and frequent handwashing) according to CDC guidelines.   5. The patient or parent/caregiver has the option to accept or refuse COVID monoclonal antibody treatment.  After reviewing this information with the patient, the patient has agreed to receive  one of the available covid 19 monoclonal antibodies and will be provided an appropriate fact sheet prior to infusion. Fenton Foy, NP 08/20/2020 12:58 PM

## 2020-08-28 ENCOUNTER — Encounter: Payer: Self-pay | Admitting: Hematology and Oncology

## 2020-09-14 IMAGING — MR MR BILATERAL BREAST WITHOUT AND WITH CONTRAST
7 of 12 series · 28 of 48 positions shown · IV contrast (Contrast Agent)
Comparison: Previous exam(s).

CLINICAL DATA: RIGHT breast biopsy performed on 09/26/2018 with
results of IDC at both sites. Family history of breast cancer.

LABS:  BUN 13, creatinine 0.6, GFR 98
EXAM:
BILATERAL BREAST MRI WITH AND WITHOUT CONTRAST
TECHNIQUE: Multiplanar, multisequence MR images of both breasts were obtained
prior to and following the intravenous administration of 7 ml of
Gadavist

[Series 2: t2_tirm_tra ipat (a-p) · axial · 3.0mm · 0.70mm/px · 1 of 63 slices shown]
[im 1/63]
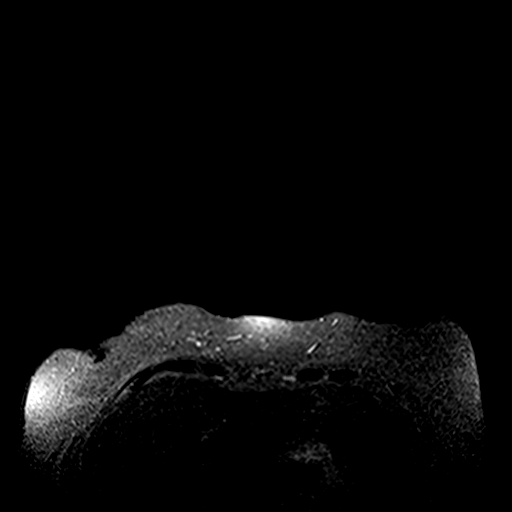

[Series 3: fl3d pre-cm no · axial · non-contrast · 0.9mm · 0.94mm/px · z∈[-62,+124]mm · 5 of 208 slices shown]
[im 1/208]
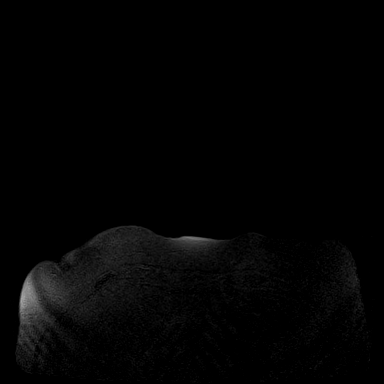
[im 52/208]
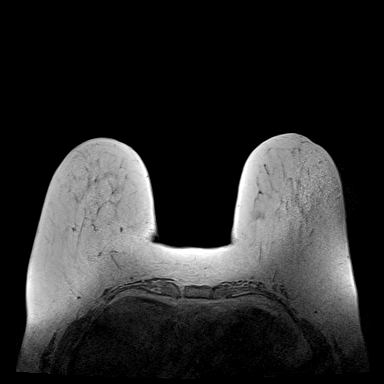
[im 104/208]
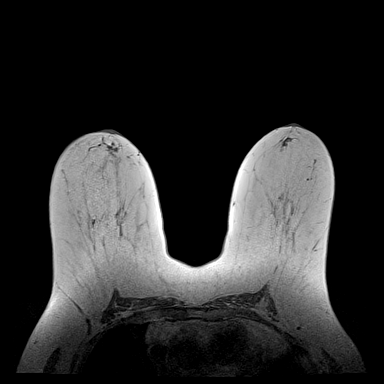
[im 156/208]
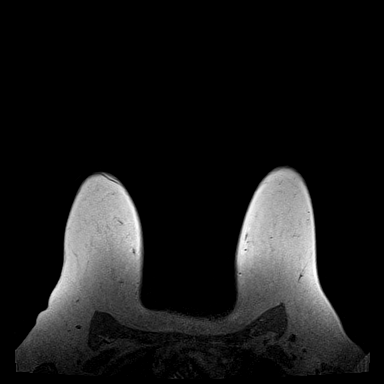
[im 208/208]
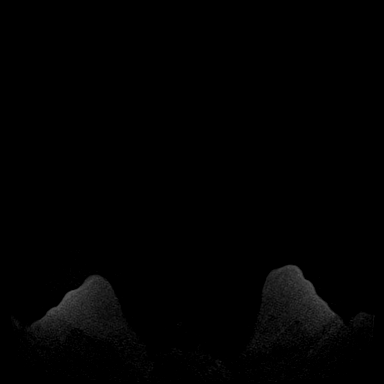

[Series 4: fl3d pre-cm · axial · non-contrast · 0.9mm · 0.87mm/px · z∈[-62,+124]mm · 5 of 208 slices shown]
[im 1/208]
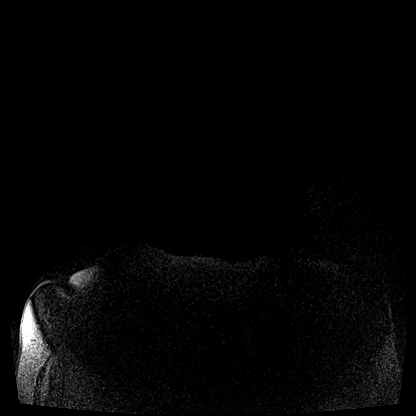
[im 52/208]
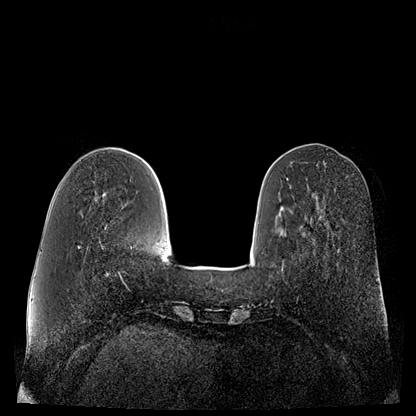
[im 104/208]
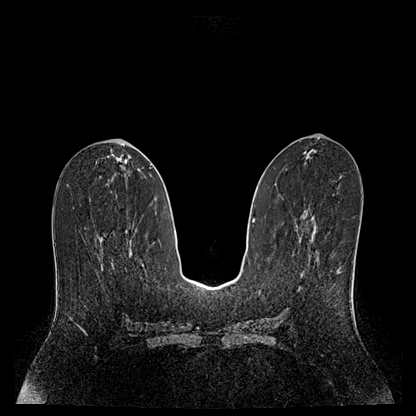
[im 156/208]
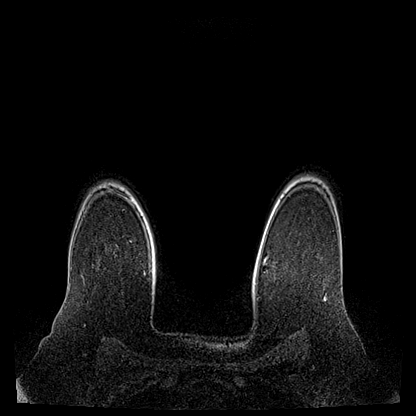
[im 208/208]
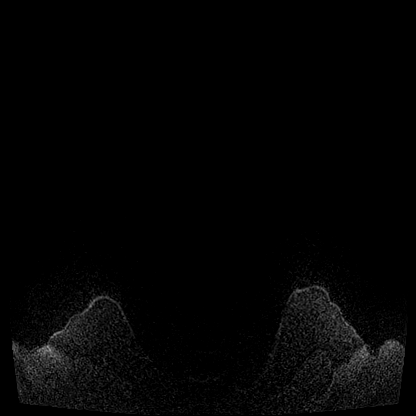

[Series 5: fl3d post-cm 20 · axial · 0.9mm · 0.87mm/px · z∈[-62,+124]mm · 5 of 208 slices shown (1 of 3)]
[im 1/208]
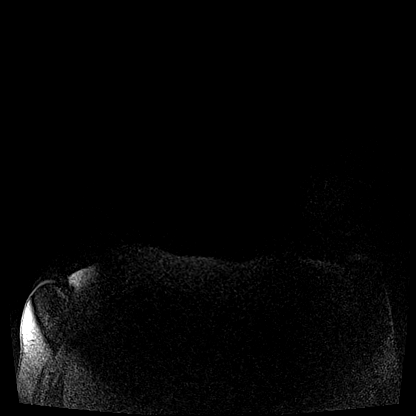
[im 52/208]
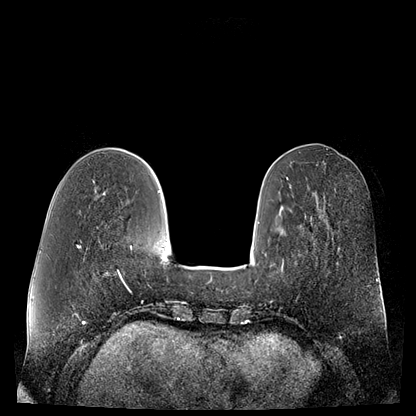
[im 104/208]
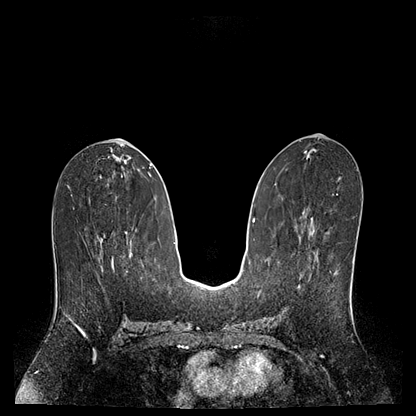
[im 156/208]
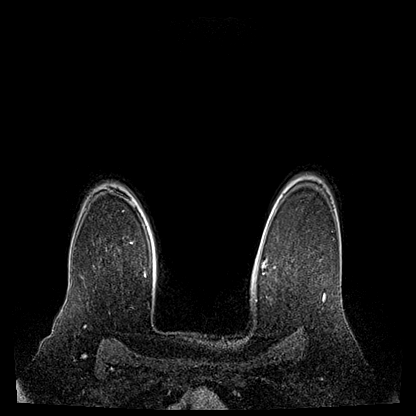
[im 208/208]
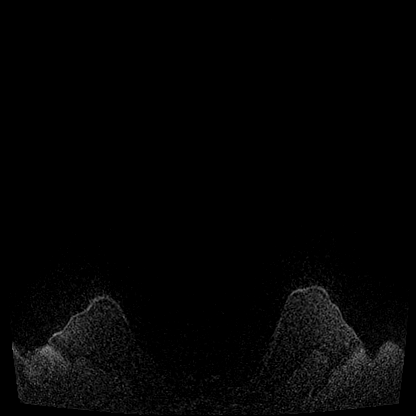

[Series 6: fl3d post-cm 20 · axial · 0.9mm · 0.87mm/px · z∈[-62,+124]mm · 5 of 208 slices shown (2 of 3)]
[im 1/208]
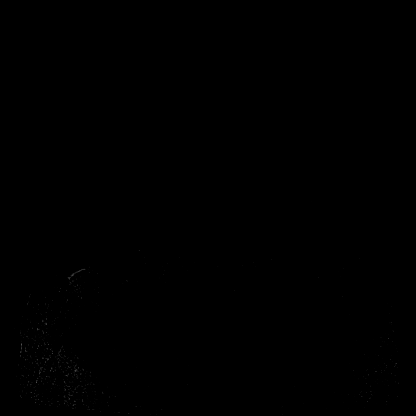
[im 52/208]
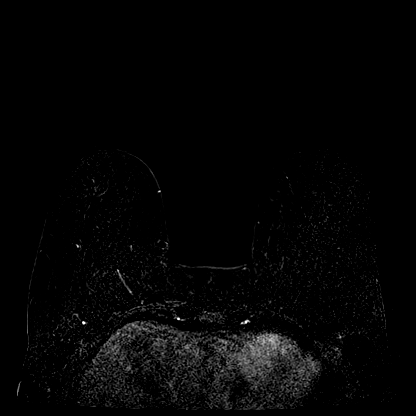
[im 104/208]
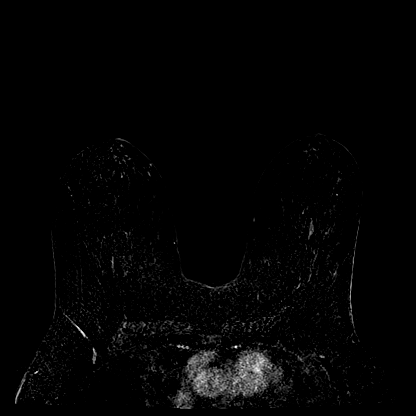
[im 156/208]
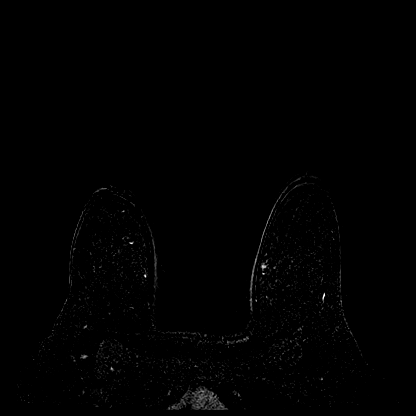
[im 208/208]
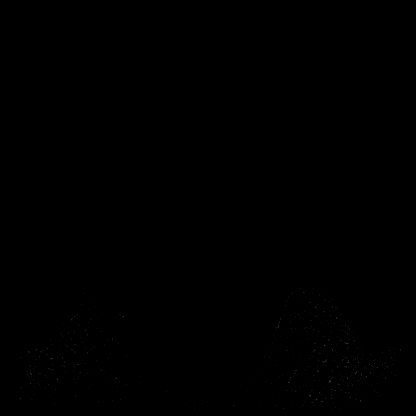

[Series 7: fl3d post-cm 20 · axial · 187.2mm · 0.87mm/px · 1 of 1 slices shown (3 of 3)]
[im 1/1]
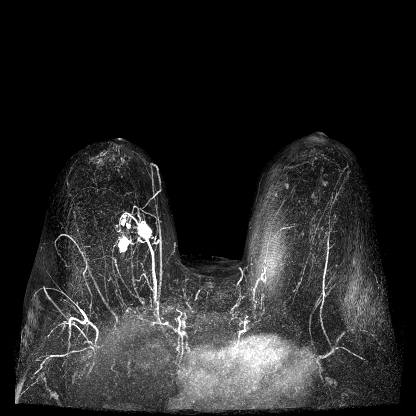

[Series 8: fl3d post-cm 3min · axial · 0.9mm · 0.87mm/px · z∈[-62,+124]mm · 6 of 208 slices shown]
[im 1/208]
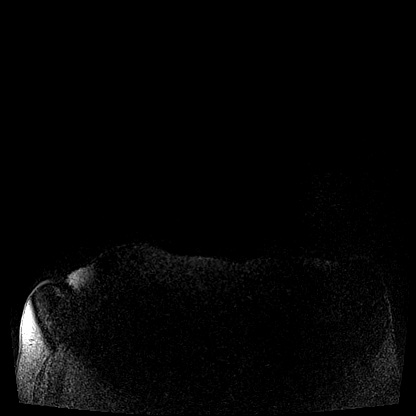
[im 42/208]
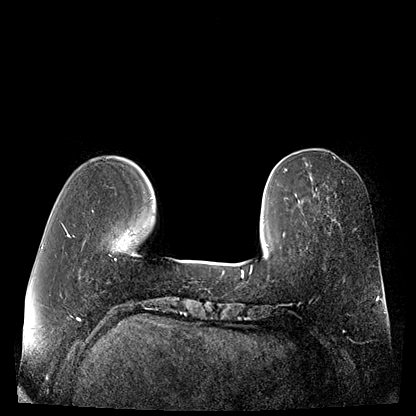
[im 83/208]
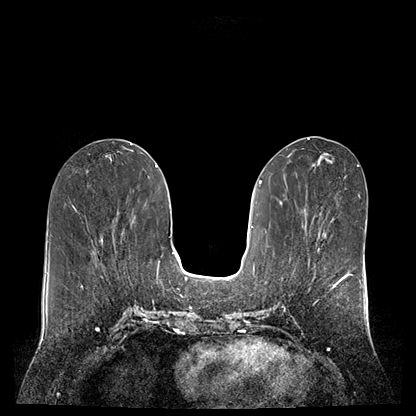
[im 125/208]
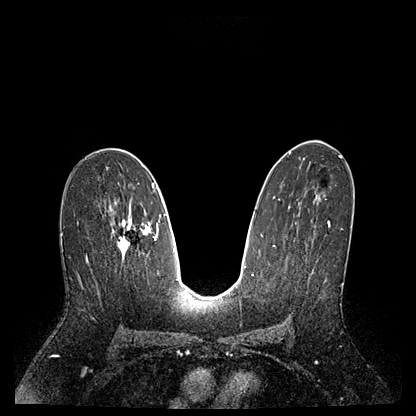
[im 166/208]
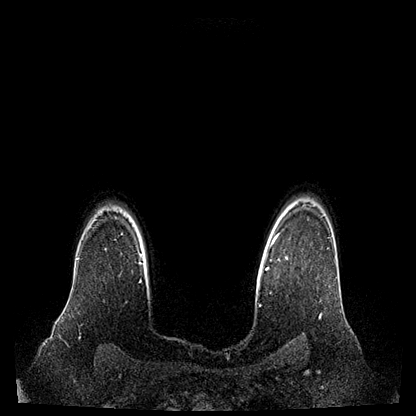
[im 208/208]
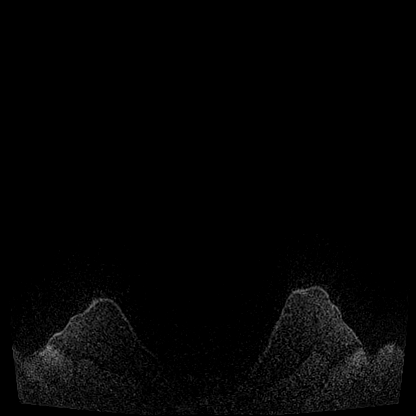

[28 of 48 positions shown; findings below may reference images not displayed]

Three-dimensional MR images were rendered by post-processing of the
original MR data on an independent workstation. The
three-dimensional MR images were interpreted, and findings are
reported in the following complete MRI report for this study. Three
dimensional images were evaluated at the independent [employer]
workstation
FINDINGS: Breast composition: b. Scattered fibroglandular tissue.

Background parenchymal enhancement: Moderate.

Right breast: 2 adjacent irregular enhancing masses within the upper
inner quadrant of the RIGHT breast, at middle to posterior depth,
both measuring 1.3 cm greatest dimension, with associated biopsy
clips corresponding to given history biopsy-proven invasive ductal
carcinomas. Together, these 2 adjacent masses measure approximately
3.7 cm extent.

Two additional small irregular enhancing masses are located
approximately 9 mm anterior to the biopsy-proven carcinomas, largest
measuring 7 mm, compatible with associated satellite lesions, with
highly suspicious washout kinetics.

Additional 5 mm irregular enhancing mass located approximately
cm anterior-inferior to the biopsy-proven carcinomas, compatible
with associated satellite lesion, also with highly suspicious
washout kinetics.

No additional enhancing masses, suspicious non-mass enhancement or
secondary signs of malignancy elsewhere within the RIGHT breast.

Left breast: No mass or abnormal enhancement.

Lymph nodes: No abnormal appearing lymph nodes.

Ancillary findings:  None.
IMPRESSION: 1. Two adjacent biopsy-proven invasive carcinomas within the upper
inner quadrant of the RIGHT breast, middle to posterior depth, both
measuring 1.3 cm. Together, these 2 biopsy-proven carcinomas measure
3.7 cm extent.
2. Small associated satellite lesions located slightly anterior and
anterior-inferior to the biopsy-proven carcinomas in the RIGHT
breast (most distant satellite lesion located 1.5 cm
anterior-inferior to the biopsy proven carcinomas), largest
measuring 5-7 mm in size.
3. No evidence of malignancy within the LEFT breast.
4. No enlarged or morphologically abnormal lymph nodes identified.

RECOMMENDATION:
Per current treatment plan for patient's biopsy-proven RIGHT breast
carcinomas.

BI-RADS CATEGORY  6: Known biopsy-proven malignancy.

## 2020-09-24 IMAGING — CR DG CHEST 1V PORT
1 series · 1 of 1 positions shown · non-contrast
Comparison: Two-view chest x-ray 08/15/2018

CLINICAL DATA: Port-A-Cath placement.

EXAM:
PORTABLE CHEST 1 VIEW

[chest ap]
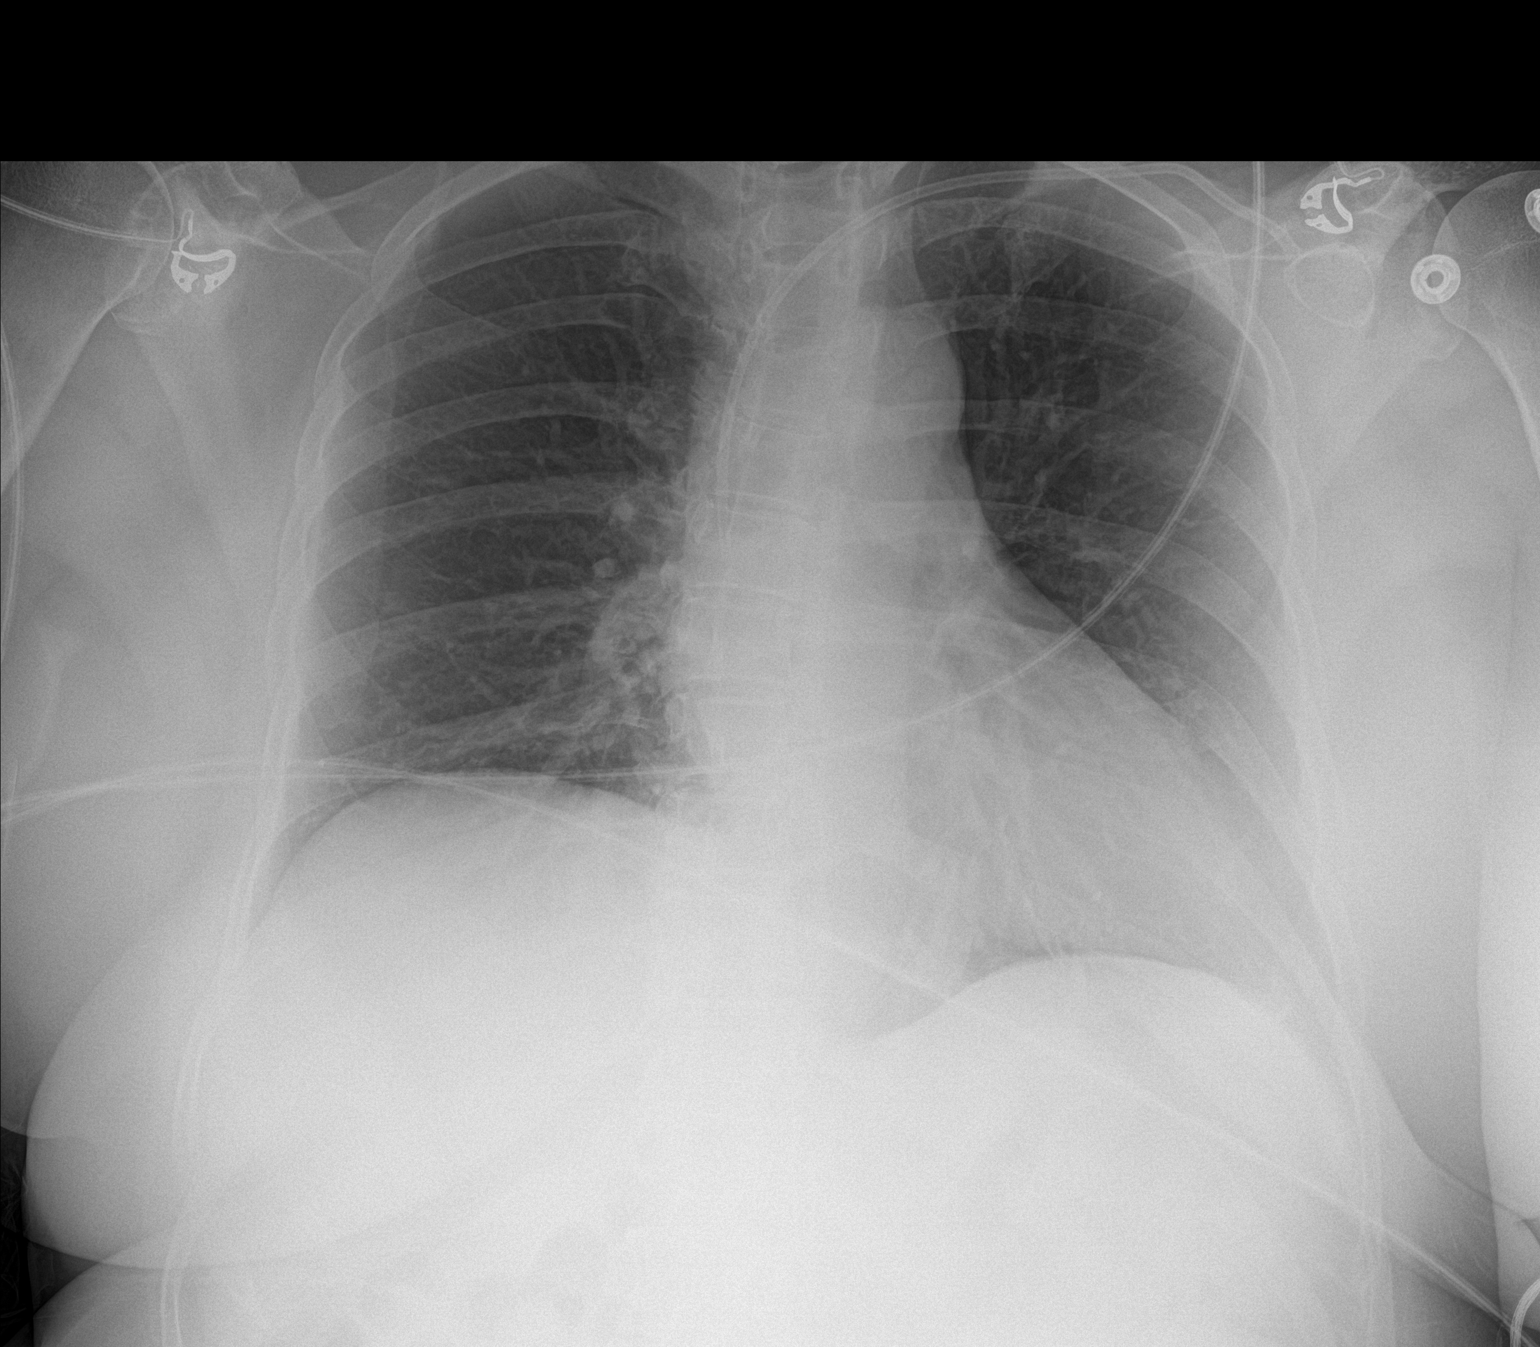

[1 of 1 positions shown; findings below may reference images not displayed]

FINDINGS: The heart size is normal. Lung volumes are low. There is no edema or
effusion. No focal airspace disease is present.

A new left subclavian Port-A-Cath is in place. The tip is in the
distal SVC, above the cavoatrial junction. There is no pneumothorax.
IMPRESSION: 1. Interval placement of left subclavian Port-A-Cath with tip in
satisfactory position. No radiographic evidence for complication.
2. Low lung volumes with mild bibasilar atelectasis.

## 2020-09-25 ENCOUNTER — Other Ambulatory Visit: Payer: Self-pay | Admitting: Hematology and Oncology

## 2020-09-30 ENCOUNTER — Encounter: Payer: Self-pay | Admitting: Hematology and Oncology

## 2020-10-12 ENCOUNTER — Other Ambulatory Visit (HOSPITAL_COMMUNITY): Payer: Self-pay | Admitting: Hematology and Oncology

## 2020-10-12 ENCOUNTER — Telehealth: Payer: Self-pay

## 2020-10-12 ENCOUNTER — Other Ambulatory Visit: Payer: Self-pay

## 2020-10-12 DIAGNOSIS — C50211 Malignant neoplasm of upper-inner quadrant of right female breast: Secondary | ICD-10-CM

## 2020-10-12 DIAGNOSIS — C50919 Malignant neoplasm of unspecified site of unspecified female breast: Secondary | ICD-10-CM

## 2020-10-12 DIAGNOSIS — Z170 Estrogen receptor positive status [ER+]: Secondary | ICD-10-CM

## 2020-10-12 NOTE — Telephone Encounter (Signed)
Added lab appointment per 1/3 schedule message. [name] aware.

## 2020-10-12 NOTE — Telephone Encounter (Signed)
UPBEAT [id number] - UNDERSTANDING and PREDICTING BREAST CANCER EVENTS AFTER TREATMENT   10/12/2020 11:25AM    OUTGOING CALL: Outgoing call to Marigrace: verified that I was speaking with the correct person (we spoke for approximately ten minutes). I introduce myself as a clinical research nurse for [facility], seeking to inform her of a new 28-month questionnaire recently added to the UPBEAT study and the option to send questionnaires via email, as well as schedule her 80-month UPBEAT visit. After advising Alexine of the additional questionnaire and the option to complete via email, she verbalizes consent to receive questionnaires via email at [email or web address] and consents to the new questionnaire. Sameeha has several upcoming appointments and her husband has recently started physical therapy following knee surgery, so she provides a list of dates/times that she is unavailable for the visit. I advise her that I will work with her schedule to complete her 82-month requirements on a day that is convenient. Current plan is to schedule the cardiac MRI and return a call to Koula with the date and time of her MRI appointment. Leandra is also enrolled in MyChart and receives notifications through that application.  CARDIAC MRI: Upon completion of my call with Erine, the cardiac MRI department is contacted and Nasrin's 15-month cardiac MRI is scheduled for Friday, 10/23/2020 at 10am by 10/25/2020.  OUTGOING CALL: An outgoing call to Gera's number was made and a voicemail message left informing her that her cardiac MRI has been scheduled as indicated above. Lygia will receive MyChart notifications of appointments being added to her schedule for that day and I provide my direct call back number should she need to reschedule this visit. Current plan is to complete all 55-month UPBEAT assessments on Friday, 10/23/2020.  10/25/2020. Marzella Schlein, BSN, RN, CIC 10/12/2020 11:55 AM

## 2020-10-22 ENCOUNTER — Telehealth: Payer: Self-pay | Admitting: Oncology

## 2020-10-22 NOTE — Telephone Encounter (Signed)
10/22/20 - Called and spoke to patient on the phone regarding her Upbeat study visit.  Reminded the patient that on 10/23/20 that she will have fasting labs drawn.  I reminded her that she could bring a snack to eat after having her labs drawn.  I told her that her MRI for the study would be done at 10:00 for an arrival time of 9:30. She would then come back to the cancer center after her MRI to have the rest of her assessments for the study.  I reminded her to wear comfortable shoes and clothing for the assessments.  I thanked the patient for her continued support in this clinical trial. Remer Macho 10/22/20 1:00 pm

## 2020-10-23 ENCOUNTER — Ambulatory Visit (HOSPITAL_COMMUNITY)
Admission: RE | Admit: 2020-10-23 | Discharge: 2020-10-23 | Disposition: A | Discharge status: Home / Self Care | Payer: Self-pay | Source: Ambulatory Visit | Attending: Hematology and Oncology | Admitting: Hematology and Oncology

## 2020-10-23 ENCOUNTER — Inpatient Hospital Stay: Payer: BC Managed Care – PPO | Attending: Hematology and Oncology | Admitting: Emergency Medicine

## 2020-10-23 ENCOUNTER — Other Ambulatory Visit: Payer: Self-pay

## 2020-10-23 ENCOUNTER — Inpatient Hospital Stay: Payer: BC Managed Care – PPO

## 2020-10-23 VITALS — BP 127/87 | HR 74 | Ht 65.5 in | Wt 178.8 lb

## 2020-10-23 DIAGNOSIS — C50919 Malignant neoplasm of unspecified site of unspecified female breast: Secondary | ICD-10-CM

## 2020-10-23 DIAGNOSIS — C50211 Malignant neoplasm of upper-inner quadrant of right female breast: Secondary | ICD-10-CM

## 2020-10-23 DIAGNOSIS — Z170 Estrogen receptor positive status [ER+]: Secondary | ICD-10-CM

## 2020-10-23 LAB — RESEARCH LABS

## 2020-10-23 NOTE — Research (Signed)
WF [id number], Understanding and Predicting Breast Cancer Events after Treatment  10/23/20   24 Month Visit: Patient presented to the clinic by herself this morning to complete activities for UPBEAT study.   Labs: Patient's mandatory research labs were collected today.  Patient confirmed she had been fasting for more than 8 hours for that blood draw.   Cardiac MRI:  Patient was escorted to the radiology department for her MRI appointment.  The cardiac MRI was completed per protocol.   VS: Collected per protocol after resting for 5 minutes.  Blood pressure was collected twice with 1 minute between readings.  Height and weight without shoes. See VS flowsheet  PROs:  Patient completed prior to visit online.   Neurocognitive testing: Completed by this Research officer, political party.  Medication review: Reviewed medication list with patient.  Waist measurement:  Waist measurement was 41.5 inches, measured per instructions on CRF14, page 1.   Physical Functions testing: Completed per protocol without difficulty and patient tolerated well.   Gift Card: [phone] [employer] gift card given to patient for completing study activities for this time point.  Plan:  Thanked patient for her participation in this study. Informed patient that this was her last in person research visit required per protocol.  She will receive a yearly follow up phone call starting next year.    Clabe Seal Clinical Research Coordinator I  10/23/20 11:41 AM

## 2020-10-30 ENCOUNTER — Encounter: Payer: Self-pay | Admitting: Emergency Medicine

## 2020-10-30 ENCOUNTER — Telehealth: Payer: Self-pay | Admitting: Emergency Medicine

## 2020-10-30 NOTE — Telephone Encounter (Signed)
WF [id number], Understanding and Predicting Breast Cancer Events after Treatment  Outgoing Call: Called to inform patient of her blood test results.  Patient was mailed a copy and a copy was sent to her PCP at her request.  She was thanked for her time and participation.  Clabe Seal Clinical Research Coordinator I  10/30/20  3:17 PM

## 2020-10-30 NOTE — Telephone Encounter (Signed)
Opened in error

## 2020-12-10 ENCOUNTER — Other Ambulatory Visit: Payer: Self-pay | Admitting: Nurse Practitioner

## 2020-12-10 DIAGNOSIS — N8110 Cystocele, unspecified: Secondary | ICD-10-CM

## 2020-12-17 ENCOUNTER — Other Ambulatory Visit: Payer: Self-pay | Admitting: Hematology and Oncology

## 2021-04-02 ENCOUNTER — Encounter: Payer: Self-pay | Admitting: Gastroenterology

## 2021-04-20 ENCOUNTER — Ambulatory Visit (AMBULATORY_SURGERY_CENTER): Payer: BC Managed Care – PPO

## 2021-04-20 ENCOUNTER — Other Ambulatory Visit: Payer: Self-pay

## 2021-04-20 VITALS — Ht 64.5 in | Wt 185.0 lb

## 2021-04-20 DIAGNOSIS — Z1211 Encounter for screening for malignant neoplasm of colon: Secondary | ICD-10-CM

## 2021-04-20 MED ORDER — PLENVU 140 G PO SOLR: 1.0000 | ORAL | 0 refills | Status: DC

## 2021-04-20 NOTE — Progress Notes (Signed)
Patient's pre-visit was done today over the phone with the patient due to COVID-19 pandemic. Name,DOB and address verified. Insurance verified. Patient denies any allergies to Eggs and Soy. Patient denies any problems with anesthesia/sedation. Patient denies taking diet pills or blood thinners. Packet of Prep instructions mailed to patient including a copy of a consent form-pt is aware. Patient understands to call us back with any questions or concerns. [name] aware of our care-partner policy and XBOER-[id number] safety protocol.   EMMI education assigned to the patient for the procedure, sent to [place].   The patient is COVID-19 vaccinated, per patient.   Attached a coupon to [employer] rx sent to pharmacy and also mailed the coupon to pl. Corky Sing

## 2021-04-26 ENCOUNTER — Encounter: Payer: Self-pay | Admitting: Hematology and Oncology

## 2021-05-02 ENCOUNTER — Encounter: Payer: Self-pay | Admitting: Certified Registered Nurse Anesthetist

## 2021-05-03 ENCOUNTER — Encounter: Payer: Self-pay | Admitting: Gastroenterology

## 2021-05-03 ENCOUNTER — Other Ambulatory Visit: Payer: Self-pay

## 2021-05-03 ENCOUNTER — Ambulatory Visit (AMBULATORY_SURGERY_CENTER): Payer: BC Managed Care – PPO | Admitting: Gastroenterology

## 2021-05-03 VITALS — BP 121/79 | HR 73 | Temp 97.3°F | Resp 12 | Ht 64.5 in | Wt 184.0 lb

## 2021-05-03 DIAGNOSIS — D123 Benign neoplasm of transverse colon: Secondary | ICD-10-CM | POA: Diagnosis not present

## 2021-05-03 DIAGNOSIS — Z1211 Encounter for screening for malignant neoplasm of colon: Secondary | ICD-10-CM | POA: Diagnosis present

## 2021-05-03 MED ORDER — SODIUM CHLORIDE 0.9 % IV SOLN: 500.0000 mL | Freq: Once | INTRAVENOUS | Status: DC

## 2021-05-03 NOTE — Progress Notes (Signed)
1450 HR > 100 with esmolol 25 mg given IV, MD updated, vss  

## 2021-05-03 NOTE — Op Note (Signed)
[place] Patient Name: Judith Thomas Procedure Date: 05/03/2021 2:39 PM MRN: [id number] Endoscopist: Ladene Artist , MD Age: 65 Referring MD:  Date of Birth: [birth year] Gender: Female Account #: [id number] Procedure:                Colonoscopy Indications:              Screening for colorectal malignant neoplasm Medicines:                Monitored Anesthesia Care Procedure:                Pre-Anesthesia Assessment:                           - Prior to the procedure, a History and Physical                            was performed, and patient medications and                            allergies were reviewed. The patient's tolerance of                            previous anesthesia was also reviewed. The risks                            and benefits of the procedure and the sedation                            options and risks were discussed with the patient.                            All questions were answered, and informed consent                            was obtained. Prior Anticoagulants: The patient has                            taken no previous anticoagulant or antiplatelet                            agents. ASA Grade Assessment: II - A patient with                            mild systemic disease. After reviewing the risks                            and benefits, the patient was deemed in                            satisfactory condition to undergo the procedure.                           After obtaining informed consent, the colonoscope  was passed under direct vision. Throughout the                            procedure, the patient's blood pressure, pulse, and                            oxygen saturations were monitored continuously. The                            CF [id number] was introduced through the anus                            and advanced to the the cecum, identified by                            appendiceal orifice  and ileocecal valve. The                            ileocecal valve, appendiceal orifice, and rectum                            were photographed. The quality of the bowel                            preparation was good. The colonoscopy was performed                            without difficulty. The patient tolerated the                            procedure well. Scope In: [id number] PM Scope Out: 2:59:23 PM Scope Withdrawal Time: 0 hours 10 minutes 59 seconds  Total Procedure Duration: 0 hours 12 minutes 29 seconds  Findings:                 The perianal and digital rectal examinations were                            normal.                           A 7 mm polyp was found in the transverse colon. The                            polyp was sessile. The polyp was removed with a                            cold snare. Resection and retrieval were complete.                           Multiple small-mouthed diverticula were found in                            the left colon. There was no evidence of  diverticular bleeding.                           The exam was otherwise without abnormality on                            direct and retroflexion views. Complications:            No immediate complications. Estimated blood loss:                            None. Estimated Blood Loss:     Estimated blood loss: none. Impression:               - One 7 mm polyp in the transverse colon, removed                            with a cold snare. Resected and retrieved.                           - Mild diverticulosis in the left colon.                           - The examination was otherwise normal on direct                            and retroflexion views. Recommendation:           - Repeat colonoscopy after studies are complete for                            surveillance based on pathology results.                           - Patient has a contact number available for                             emergencies. The signs and symptoms of potential                            delayed complications were discussed with the                            patient. Return to normal activities tomorrow.                            Written discharge instructions were provided to the                            patient.                           - High fiber diet.                           - Continue present medications.                           -  Await pathology results. Ladene Artist, MD 05/03/2021 3:01:54 PM This report has been signed electronically.

## 2021-05-03 NOTE — Patient Instructions (Signed)
Handouts provided on polyps and diverticulosis.   Recommend a High-fiber diet (see handout).   YOU HAD AN ENDOSCOPIC PROCEDURE TODAY AT [facility]:   Refer to the procedure report that was given to you for any specific questions about what was found during the examination.  If the procedure report does not answer your questions, please call your gastroenterologist to clarify.  If you requested that your care partner not be given the details of your procedure findings, then the procedure report has been included in a sealed envelope for you to review at your convenience later.  YOU SHOULD EXPECT: Some feelings of bloating in the abdomen. Passage of more gas than usual.  Walking can help get rid of the air that was put into your GI tract during the procedure and reduce the bloating. If you had a lower endoscopy (such as a colonoscopy or flexible sigmoidoscopy) you may notice spotting of blood in your stool or on the toilet paper. If you underwent a bowel prep for your procedure, you may not have a normal bowel movement for a few days.  Please Note:  You might notice some irritation and congestion in your nose or some drainage.  This is from the oxygen used during your procedure.  There is no need for concern and it should clear up in a day or so.  SYMPTOMS TO REPORT IMMEDIATELY:  Following lower endoscopy (colonoscopy or flexible sigmoidoscopy):  Excessive amounts of blood in the stool  Significant tenderness or worsening of abdominal pains  Swelling of the abdomen that is new, acute  Fever of 100F or higher  For urgent or emergent issues, a gastroenterologist can be reached at any hour by calling [phone]. Do not use MyChart messaging for urgent concerns.    DIET:  We do recommend a small meal at first, but then you may proceed to your regular diet.  Drink plenty of fluids but you should avoid alcoholic beverages for 24 hours.  ACTIVITY:  You should plan to take it  easy for the rest of today and you should NOT DRIVE or use heavy machinery until tomorrow (because of the sedation medicines used during the test).    FOLLOW UP: Our staff will call the number listed on your records 48-72 hours following your procedure to check on you and address any questions or concerns that you may have regarding the information given to you following your procedure. If we do not reach you, we will leave a message.  We will attempt to reach you two times.  During this call, we will ask if you have developed any symptoms of COVID 19. If you develop any symptoms (ie: fever, flu-like symptoms, shortness of breath, cough etc.) before then, please call [phone].  If you test positive for Covid 19 in the 2 weeks post procedure, please call and report this information to [place].    If any biopsies were taken you will be contacted by phone or by letter within the next 1-3 weeks.  Please call us at [phone] if you have not heard about the biopsies in 3 weeks.    SIGNATURES/CONFIDENTIALITY: You and/or your care partner have signed paperwork which will be entered into your electronic medical record.  These signatures attest to the fact that that the information above on your After Visit Summary has been reviewed and is understood.  Full responsibility of the confidentiality of this discharge information lies with you and/or your care-partner.

## 2021-05-03 NOTE — Progress Notes (Signed)
Called to room to assist during endoscopic procedure.  Patient ID and intended procedure confirmed with present staff. Received instructions for my participation in the procedure from the performing physician.  

## 2021-05-03 NOTE — Progress Notes (Signed)
Pt's states no medical or surgical changes since previsit or office visit. 

## 2021-05-03 NOTE — Progress Notes (Signed)
Report given to PACU, vss 

## 2021-05-05 ENCOUNTER — Telehealth: Payer: Self-pay | Admitting: *Deleted

## 2021-05-05 NOTE — Telephone Encounter (Signed)
Follow up call made. 

## 2021-05-05 NOTE — Telephone Encounter (Signed)
  Follow up Call-  Call back number 05/03/2021  Post procedure Call Back phone  # [phone]  Permission to leave phone message Yes  Some recent data might be hidden     Patient questions:  Do you have a fever, pain , or abdominal swelling? No. Pain Score  0 *  Have you tolerated food without any problems? Yes.    Have you been able to return to your normal activities? Yes.    Do you have any questions about your discharge instructions: Diet   No. Medications  No. Follow up visit  No.  Do you have questions or concerns about your Care? No.  Actions: * If pain score is 4 or above: No action needed, pain <4.Have you developed a fever since your procedure? no  2.   Have you had an respiratory symptoms (SOB or cough) since your procedure? no  3.   Have you tested positive for COVID 19 since your procedure no  4.   Have you had any family members/close contacts diagnosed with the COVID 19 since your procedure?  no   If yes to any of these questions please route to Joylene John, RN and Joella Prince, RN

## 2021-05-11 ENCOUNTER — Encounter: Payer: Self-pay | Admitting: Gastroenterology

## 2021-07-12 NOTE — Progress Notes (Signed)
Patient Care Team: Chevis Pretty, FNP as PCP - General (Nurse Practitioner) Mauro Kaufmann, RN as Oncology Nurse Navigator Rockwell Germany, RN as Oncology Nurse Navigator Eppie Gibson, MD as Attending Physician (Radiation Oncology) Nicholas Lose, MD as Consulting Physician (Hematology and Oncology) Jovita Kussmaul, MD as Consulting Physician (General Surgery) Gwyndolyn Kaufman, RN (Inactive) as Registered Nurse  DIAGNOSIS:    ICD-10-CM   1. Malignant neoplasm of upper-inner quadrant of right breast in female, estrogen receptor positive ([place])  C50.211    Z17.0       SUMMARY OF ONCOLOGIC HISTORY: Oncology History  Malignant neoplasm of upper-inner quadrant of right breast in female, estrogen receptor positive ([place])  09/26/2018 Initial Diagnosis   Two right breast masses 12:00 to 1 o'clock position 1.3 cm and 1.2 cm, they are 1.5 cm apart.  Biopsy revealed grade 2 invasive ductal carcinoma ER 70%- 100%, PR 0% -20%, Ki-67 20%, HER-2 3+ by IHC and FISH ratio 2.15 with a gene copy number of 4.2 for the tumor that was 2+ by IHC, T1c N0 stage Ia   10/24/2018 Cancer Staging   Staging form: Breast, AJCC 8th Edition - Clinical stage from 10/24/2018: Stage IA (cT1c(2), cN0, cM0, G2, ER+, PR+, HER2+) - Signed by Nicholas Lose, MD on 10/24/2018   11/15/2018 - 10/24/2019 Chemotherapy   PACLitaxel-protein bound (ABRAXANE) chemo infusion 150 mg, 80 mg/m2 = 150 mg (100 % of original dose 80 mg/m2), Intravenous,  Once, 3 of 3 cycles. Dose modification: 80 mg/m2 (original dose 80 mg/m2, Cycle 1). Administration: 150 mg (11/29/2018), 150 mg (12/06/2018), 150 mg (12/13/2018), 150 mg (12/20/2018), 150 mg (12/27/2018), 150 mg (01/03/2019), 150 mg (01/10/2019), 150 mg (01/17/2019), 150 mg (01/24/2019)  ondansetron (ZOFRAN) 8 mg in sodium chloride 0.9 % 50 mL IVPB, 8 mg (100 % of original dose 8 mg), Intravenous,  Once, 1 of 1 cycle. Dose modification: 8 mg (original dose 8 mg, Cycle 1)  trastuzumab (HERCEPTIN)  336 mg in sodium chloride 0.9 % 250 mL chemo infusion, 4 mg/kg = 336 mg, Intravenous,  Once, 5 of 16 cycles. Dose modification: 6 mg/kg (original dose 2 mg/kg, Cycle 3, Reason: Other (see comments), Comment: changing to q3week tx). Administration: 336 mg (11/08/2018), 168 mg (11/15/2018), 168 mg (12/06/2018), 504 mg (02/14/2019), 504 mg (03/05/2019), 168 mg (11/22/2018), 168 mg (11/29/2018), 168 mg (12/13/2018), 168 mg (12/20/2018), 168 mg (12/27/2018), 168 mg (01/03/2019), 168 mg (01/10/2019), 168 mg (01/17/2019), 504 mg (01/24/2019)  PACLitaxel (TAXOL) 156 mg in sodium chloride 0.9 % 250 mL chemo infusion (</= $RemoveBefor'80mg'IOdllzslNoiS$ /m2), 80 mg/m2 = 156 mg, Intravenous,  Once, 1 of 1 cycle. Administration: 156 mg (11/08/2018), 156 mg (11/15/2018), 156 mg (11/22/2018)  ado-trastuzumab emtansine (KADCYLA) 300 mg in sodium chloride 0.9 % 250 mL chemo infusion, 3.6 mg/kg = 300 mg, Intravenous, Once, 11 of 11 cycles. Administration: 300 mg (03/28/2019), 300 mg (04/18/2019), 300 mg (06/21/2019), 300 mg (05/09/2019), 300 mg (05/30/2019), 300 mg (07/11/2019), 300 mg (08/01/2019), 300 mg (08/22/2019), 300 mg (09/12/2019), 260 mg (10/02/2019), 260 mg (10/24/2019)    01/09/2019 Genetic Testing   Negative genetic testing on the common hereditary cancer panel. The Hereditary Gene Panel offered by [facility] includes sequencing and/or deletion duplication testing of the following 48 genes: APC, ATM, AXIN2, BARD1, BMPR1A, BRCA1, BRCA2, BRIP1, CDH1, CDK4, CDKN2A (p14ARF), CDKN2A (p16INK4a), CHEK2, CTNNA1, DICER1, EPCAM (Deletion/duplication testing only), GREM1 (promoter region deletion/duplication testing only), KIT, MEN1, MLH1, MSH2, MSH3, MSH6, MUTYH, NBN, NF1, NHTL1, PALB2, PDGFRA, PMS2, POLD1, POLE, PTEN, RAD50, RAD51C,  RAD51D, RNF43, SDHB, SDHC, SDHD, SMAD4, SMARCA4. STK11, TP53, TSC1, TSC2, and VHL.  The following genes were evaluated for sequence changes only: SDHA and HOXB13 c.251G>A variant only. The report date is January 09, 2019.   03/08/2019 Surgery   Right  lumpectomy [name]) [phone]): IDC s/p neoadjuvant treatment, grade 2, 0.7cm, ER+ 100%, PR+ 5%, HER2 equivocal, Ki67 20%, 2 right axillary sentinel lymph nodes negative for carcinoma, with clear margins.    03/29/2019 Cancer Staging   Staging form: Breast, AJCC 8th Edition - Pathologic stage from 03/29/2019: No Stage Recommended (ypT1b, pN0, cM0, G2, ER+, PR+, HER2: Equivocal)     04/08/2019 - 05/06/2019 Radiation Therapy   The patient initially received a dose of 40.05 Gy in 15 fractions to the breast using whole-breast tangent fields. This was delivered using a 3-D conformal technique. The pt received a boost delivering an additional 10 Gy in 5 fractions using a electron boost with 64meV electrons. The total dose was 50.05 Gy.    09/2019 - 09/2024 Anti-estrogen oral therapy   Anastrozole     CHIEF COMPLIANT: Follow-up of right breast cancer on anastrozole  INTERVAL HISTORY: Judith Thomas is a 65 y.o. with above-mentioned history of right breast cancer treated with neoadjuvant chemotherapy, lumpectomy, radiation, Kadcyla maintenance, and is currently on antiestrogen therapy with anastrozole. She is a participant in the UpBeat clinical trial. She presents to the clinic today for follow-up.  She is tolerating anastrozole fairly well.  She does have mild hot flashes.  Joint stiffness is about the same.  Gets better with exercise.  Denies any lumps or nodules in the breast.  Mammograms are going to be done in January 2023.  Bone density will be ordered for February 2023.  [facility] 2 [facility] be [facility] at [facility].  ALLERGIES:  is allergic to paclitaxel.  MEDICATIONS:  Current Outpatient Medications  Medication Sig Dispense Refill   anastrozole (ARIMIDEX) 1 MG tablet TAKE 1 TABLET BY MOUTH EVERY DAY 90 tablet 3   fluticasone 0.05%-ketoconazole 2% 1:1 cream mixture apply TO THE AFFECTED AREA TWICE DAILY AS NEEDED     Multiple Vitamin (MULTIVITAMIN) tablet Take 1 tablet by mouth daily.      Polyethyl  Glycol-Propyl Glycol (SYSTANE FREE OP) Apply to eye at bedtime as needed.     No current facility-administered medications for this visit.    PHYSICAL EXAMINATION: ECOG PERFORMANCE STATUS: 1 - Symptomatic but completely ambulatory  Vitals:   07/13/21 1025  BP: (!) 151/75  Pulse: 65  Resp: 19  Temp: 97.6 F (36.4 C)  SpO2: 99%   Filed Weights   07/13/21 1025  Weight: 184 lb 11.2 oz (83.8 kg)    BREAST: No palpable masses or nodules in either right or left breasts. No palpable axillary supraclavicular or infraclavicular adenopathy no breast tenderness or nipple discharge. (exam performed in the presence of a chaperone)  LABORATORY DATA:  I have reviewed the data as listed CMP Latest Ref Rng & Units 08/18/2020 10/24/2019 10/02/2019  Glucose 65 - 99 mg/dL 84 95 96  BUN 8 - 27 mg/dL $Remove'9 13 15  'iksTLhG$ Creatinine 0.57 - 1.00 mg/dL 0.79 0.70 0.73  Sodium 134 - 144 mmol/L 142 140 139  Potassium 3.5 - 5.2 mmol/L 5.0 4.1 3.8  Chloride 96 - 106 mmol/L 101 105 104  CO2 20 - 29 mmol/L $RemoveB'26 25 26  'tFOcVZYd$ Calcium 8.7 - 10.3 mg/dL 9.8 8.9 9.0  Total Protein 6.0 - 8.5 g/dL 7.5 7.7 7.7  Total Bilirubin 0.0 - 1.2  mg/dL 0.7 1.0 0.9  Alkaline Phos 44 - 121 IU/L 161(H) 168(H) 153(H)  AST 0 - 40 IU/L 18 39 45(H)  ALT 0 - 32 IU/L $Remov'17 27 30    'LaJTrB$ Lab Results  Component Value Date   WBC 6.0 08/18/2020   HGB 13.4 08/18/2020   HCT 39.7 08/18/2020   MCV 94 08/18/2020   PLT 207 08/18/2020   NEUTROABS 4.0 08/18/2020    ASSESSMENT & PLAN:  Malignant neoplasm of upper-inner quadrant of right breast in female, estrogen receptor positive ([place]) 09/27/19: Two right breast masses 12:00 to 1 o'clock position 1.3 cm and 1.2 cm, they are 1.5 cm apart.  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 Neoadjuvant chemotherapy with Taxol Herceptin weekly  x12 completed 01/24/2019 followed by Herceptin maintenance vs Kadcyla for 1 year completed 10/24/2019 2.  03/08/2019: Right lumpectomy IDC s/p neoadjuvant treatment, grade 2, 0.7cm, ER+ 100%, PR+ 5%, HER2 equivocal, Ki67 20%, 0/2 right axillary sentinel lymph nodes negative for carcinoma, with clear margins.  3. Followed by adjuvant radiation therapy completed 05/06/2019 4.  Followed by antiestrogen therapy started 10/02/2019 ------------------------------------------------------------------------------------------------------------------------------------------------------------ Current Treatment: Anastrozole 1 mg daily started 10/01/20   Anastrozole Toxicities:  1.  Mild to moderate hot flashes 2. mild to moderate joint stiffness: Improved with exercise 3.  Weight issues:    Bone density has been ordered for February 2023.  Breast Cancer Surveillance: 1. Breast Exam: 07/13/20: Benign 2. Mammograms: 10/08/2020: Benign, next mammogram will be done in January 2023.  RTC in 1 year for follow up    No orders of the defined types were placed in this encounter.  The patient has a good understanding of the overall plan. she agrees with it. she will call with any problems that may develop before the next visit here.  Total time spent: 20 mins including face to face time and time spent for planning, charting and coordination of care  Rulon Eisenmenger, MD, MPH 07/13/2021  I, Thana Ates, am acting as scribe for Dr. Nicholas Lose.  I have reviewed the above documentation for accuracy and completeness, and I agree with the above.

## 2021-07-13 ENCOUNTER — Inpatient Hospital Stay: Payer: BC Managed Care – PPO | Attending: Hematology and Oncology | Admitting: Hematology and Oncology

## 2021-07-13 ENCOUNTER — Other Ambulatory Visit: Payer: Self-pay

## 2021-07-13 VITALS — BP 151/75 | HR 65 | Temp 97.6°F | Resp 19 | Ht 64.5 in | Wt 184.7 lb

## 2021-07-13 DIAGNOSIS — Z780 Asymptomatic menopausal state: Secondary | ICD-10-CM

## 2021-07-13 DIAGNOSIS — Z79811 Long term (current) use of aromatase inhibitors: Secondary | ICD-10-CM | POA: Insufficient documentation

## 2021-07-13 DIAGNOSIS — Z9221 Personal history of antineoplastic chemotherapy: Secondary | ICD-10-CM | POA: Insufficient documentation

## 2021-07-13 DIAGNOSIS — Z923 Personal history of irradiation: Secondary | ICD-10-CM | POA: Insufficient documentation

## 2021-07-13 DIAGNOSIS — Z170 Estrogen receptor positive status [ER+]: Secondary | ICD-10-CM | POA: Insufficient documentation

## 2021-07-13 DIAGNOSIS — N951 Menopausal and female climacteric states: Secondary | ICD-10-CM | POA: Insufficient documentation

## 2021-07-13 DIAGNOSIS — C50211 Malignant neoplasm of upper-inner quadrant of right female breast: Secondary | ICD-10-CM | POA: Insufficient documentation

## 2021-07-13 MED ORDER — ANASTROZOLE 1 MG PO TABS: 1.0000 mg | ORAL_TABLET | Freq: Every day | ORAL | 3 refills | Status: DC

## 2021-07-13 NOTE — Assessment & Plan Note (Signed)
09/27/19:Two right breast masses 12:00 to 1 o'clock position 1.3 cm and 1.2 cm, they are 1.5 cm apart. 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 Neoadjuvant chemotherapy withTaxol Herceptin weekly x12completed 4/16/2020followed by Herceptin maintenance vsKadcylafor 1 year completed 10/24/2019 2.03/08/2019: Right lumpectomy IDC s/p neoadjuvant treatment, grade 2, 0.7cm, ER+ 100%, PR+ 5%, HER2 equivocal, Ki67 20%, 0/2 right axillary sentinel lymph nodes negative for carcinoma, with clear margins. 3. Followed by adjuvant radiation therapycompleted 05/06/2019 4.Followed by antiestrogen therapy started 10/02/2019 ------------------------------------------------------------------------------------------------------------------------------------------------------------ Current Treatment:Anastrozole 1 mg daily started 10/01/20  Anastrozole Toxicities: 1.  Mild to moderate hot flashes 2. mild to moderate joint stiffness: Improved with exercise 3.  Weight issues: Patient gained about 5 pounds and is worried about the weight gain.  We talked about intermittent fasting and dietary changes that might help her lose more weight.  I instructed her that she needs to measure the calories to keep the total calorie intake below 1800.  Breast Cancer Surveillance: 1. Breast Exam: 07/13/20: Benign 2. Mammograms: 10/08/2020: Benign  RTC in 1 year for follow up

## 2021-08-20 ENCOUNTER — Ambulatory Visit: Payer: BC Managed Care – PPO | Admitting: Internal Medicine

## 2021-08-24 ENCOUNTER — Ambulatory Visit: Payer: BC Managed Care – PPO | Admitting: Internal Medicine

## 2021-08-24 ENCOUNTER — Encounter: Payer: Self-pay | Admitting: Internal Medicine

## 2021-08-24 VITALS — BP 124/80 | HR 83 | Temp 98.0°F | Ht 65.0 in | Wt 185.2 lb

## 2021-08-24 DIAGNOSIS — Z0000 Encounter for general adult medical examination without abnormal findings: Secondary | ICD-10-CM

## 2021-08-24 DIAGNOSIS — C50211 Malignant neoplasm of upper-inner quadrant of right female breast: Secondary | ICD-10-CM

## 2021-08-24 DIAGNOSIS — Z23 Encounter for immunization: Secondary | ICD-10-CM | POA: Diagnosis not present

## 2021-08-24 DIAGNOSIS — Z170 Estrogen receptor positive status [ER+]: Secondary | ICD-10-CM | POA: Diagnosis not present

## 2021-08-24 LAB — COMPREHENSIVE METABOLIC PANEL
ALT: 12 U/L (ref 0–35)
AST: 15 U/L (ref 0–37)
Albumin: 4.4 g/dL (ref 3.5–5.2)
Alkaline Phosphatase: 143 U/L — ABNORMAL HIGH (ref 39–117)
BUN: 13 mg/dL (ref 6–23)
CO2: 30 mEq/L (ref 19–32)
Calcium: 9.5 mg/dL (ref 8.4–10.5)
Chloride: 102 mEq/L (ref 96–112)
Creatinine, Ser: 0.72 mg/dL (ref 0.40–1.20)
GFR: 88.37 mL/min (ref >60.00)
Glucose, Bld: 96 mg/dL (ref 70–99)
Potassium: 4.2 mEq/L (ref 3.5–5.1)
Sodium: 139 mEq/L (ref 135–145)
Total Bilirubin: 0.7 mg/dL (ref 0.2–1.2)
Total Protein: 7.6 g/dL (ref 6.0–8.3)

## 2021-08-24 LAB — LIPID PANEL
Cholesterol: 159 mg/dL (ref 0–200)
HDL: 52.7 mg/dL (ref >39.00)
LDL Cholesterol: 78 mg/dL (ref 0–99)
NonHDL: 106.04
Total CHOL/HDL Ratio: 3
Triglycerides: 141 mg/dL (ref 0.0–149.0)
VLDL: 28.2 mg/dL (ref 0.0–40.0)

## 2021-08-24 LAB — VITAMIN B12: Vitamin B-12: 190 pg/mL — ABNORMAL LOW (ref 211–911)

## 2021-08-24 LAB — HEMOGLOBIN A1C: Hgb A1c MFr Bld: 6 % (ref 4.6–6.5)

## 2021-08-24 LAB — VITAMIN D 25 HYDROXY (VIT D DEFICIENCY, FRACTURES): VITD: 24.35 ng/mL — ABNORMAL LOW (ref 30.00–100.00)

## 2021-08-24 LAB — TSH: TSH: 1.53 u[IU]/mL (ref 0.35–5.50)

## 2021-08-24 NOTE — Patient Instructions (Signed)
-  Nice seeing you today!!  -Lab work today; will notify you once results are available.  -Tdap today.  -Remember your shingles vaccine.  -See you back in 1 year or sooner as needed.

## 2021-08-24 NOTE — Addendum Note (Signed)
Addended by: Rosalyn Gess D on: 08/24/2021 08:25 AM   Modules accepted: Orders

## 2021-08-24 NOTE — Progress Notes (Signed)
New Patient Office Visit     This visit occurred during the SARS-CoV-2 public health emergency.  Safety protocols were in place, including screening questions prior to the visit, additional usage of staff PPE, and extensive cleaning of exam room while observing appropriate contact time as indicated for disinfecting solutions.    CC/Reason for Visit: Establish care, annual preventive exam Previous PCP: Chevis Pretty, NP Last Visit: Unknown  HPI: Judith Thomas is a 65 y.o. female who is coming in today for the above mentioned reasons. Past Medical History is significant for: Right breast cancer status postlumpectomy, radiation and chemotherapy.  She is now on anastrozole for 7 years, she is followed by Dr. Lindi Adie.  She has no acute concerns or complaints today.  She is overdue for Tdap and shingles vaccines.  She will be due for mammogram at the end of the year, she had a colonoscopy in 2022 and a Pap smear last week.  Her bone density scan is updated.   Past Medical/Surgical History: Past Medical History:  Diagnosis Date   Breast cancer ([place])    right   Family history of bladder cancer    Family history of breast cancer    Knee pain, chronic 10/11/2015    Past Surgical History:  Procedure Laterality Date   BREAST LUMPECTOMY WITH RADIOACTIVE SEED AND SENTINEL LYMPH NODE BIOPSY Right 03/08/2019   Procedure: RIGHT BREAST RADIOACTIVE SEED LUMPECTOMY X2 AND SENTINEL LYMPH NODE BIOPSY;  Surgeon: Jovita Kussmaul, MD;  Location: [place];  Service: General;  Laterality: Right;   COLONOSCOPY  03/2010   none     PORT-A-CATH REMOVAL Left 12/30/2019   Procedure: REMOVAL PORT-A-CATH;  Surgeon: Jovita Kussmaul, MD;  Location: [place];  Service: General;  Laterality: Left;   PORTACATH PLACEMENT Left 11/07/2018   Procedure: INSERTION PORT-A-CATH WITH ULTRASOUND;  Surgeon: Jovita Kussmaul, MD;  Location: [employer];  Service: General;   Laterality: Left;    Social History:  reports that she has never smoked. She has never used smokeless tobacco. She reports current alcohol use. She reports that she does not use drugs.  Allergies: Allergies  Allergen Reactions   Paclitaxel Other (See Comments)    Nausea, flushing    Family History:  Family History  Problem Relation Age of Onset   Liver disease Mother    Heart disease Mother        afib   Heart disease Father    Asthma Father    Diabetes Father    Bladder Cancer Father        smoker   Breast cancer Maternal Aunt    Breast cancer Maternal Aunt        mets to bone   Heart disease Maternal Grandfather    Kidney disease Paternal Grandmother    Heart disease Paternal Grandfather    Heart disease Other    Colon cancer Neg Hx    Colon polyps Neg Hx    Esophageal cancer Neg Hx    Rectal cancer Neg Hx    Stomach cancer Neg Hx      Current Outpatient Medications:    anastrozole (ARIMIDEX) 1 MG tablet, Take 1 tablet (1 mg total) by mouth daily., Disp: 90 tablet, Rfl: 3   fluticasone 0.05%-ketoconazole 2% 1:1 cream mixture, apply TO THE AFFECTED AREA TWICE DAILY AS NEEDED, Disp: , Rfl:    Multiple Vitamin (MULTIVITAMIN) tablet, Take 1 tablet by mouth daily. , Disp: ,  Rfl:    Polyethyl Glycol-Propyl Glycol (SYSTANE FREE OP), Apply to eye at bedtime as needed., Disp: , Rfl:   Review of Systems:  Constitutional: Denies fever, chills, diaphoresis, appetite change and fatigue.  HEENT: Denies photophobia, eye pain, redness, hearing loss, ear pain, congestion, sore throat, rhinorrhea, sneezing, mouth sores, trouble swallowing, neck pain, neck stiffness and tinnitus.   Respiratory: Denies SOB, DOE, cough, chest tightness,  and wheezing.   Cardiovascular: Denies chest pain, palpitations and leg swelling.  Gastrointestinal: Denies nausea, vomiting, abdominal pain, diarrhea, constipation, blood in stool and abdominal distention.  Genitourinary: Denies dysuria, urgency,  frequency, hematuria, flank pain and difficulty urinating.  Endocrine: Denies: hot or cold intolerance, sweats, changes in hair or nails, polyuria, polydipsia. Musculoskeletal: Denies myalgias, back pain, joint swelling, arthralgias and gait problem.  Skin: Denies pallor, rash and wound.  Neurological: Denies dizziness, seizures, syncope, weakness, light-headedness, numbness and headaches.  Hematological: Denies adenopathy. Easy bruising, personal or family bleeding history  Psychiatric/Behavioral: Denies suicidal ideation, mood changes, confusion, nervousness, sleep disturbance and agitation    Physical Exam: Vitals:   08/24/21 0751  BP: 124/80  Pulse: 83  Temp: 98 F (36.7 C)  TempSrc: Oral  SpO2: 97%  Weight: 185 lb 3.2 oz (84 kg)  Height: 5\' 5"  (1.651 m)   Body mass index is 30.82 kg/m.  Constitutional: NAD, calm, comfortable Eyes: PERRL, lids and conjunctivae normal ENMT: Mucous membranes are moist.  Tympanic membrane is pearly white, no erythema or bulging. Neck: normal, supple, no masses, no thyromegaly Respiratory: clear to auscultation bilaterally, no wheezing, no crackles. Normal respiratory effort. No accessory muscle use.  Cardiovascular: Regular rate and rhythm, no murmurs / rubs / gallops. No extremity edema. 2+ pedal pulses. No carotid bruits.  Abdomen: no tenderness, no masses palpated. No hepatosplenomegaly. Bowel sounds positive.  Musculoskeletal: no clubbing / cyanosis. No joint deformity upper and lower extremities. Good ROM, no contractures. Normal muscle tone.  Skin: no rashes, lesions, ulcers. No induration Neurologic: CN 2-12 grossly intact. Sensation intact, DTR normal. Strength 5/5 in all 4.  Psychiatric: Normal judgment and insight. Alert and oriented x 3. Normal mood.    Impression and Plan:  Encounter for preventive health examination -Recommend routine eye and dental care. -Immunizations: Tdap today, she is still due for shingles vaccine which  she declines today, otherwise immunizations are up-to-date and age-appropriate -Healthy lifestyle discussed in detail. -Labs to be updated today. -Colon cancer screening: July 2022 -Breast cancer screening: December 2021, due soon, already scheduled -Cervical cancer screening: August 14, 2021 -Lung cancer screening: Not applicable -Prostate cancer screening: Not applicable -DEXA: February 2021  Malignant neoplasm of upper-inner quadrant of right breast in female, estrogen receptor positive ([place])  -Noted, on anastrozole, followed by oncology.  Need for Tdap vaccination -Tdap administered today     Patient Instructions  -Nice seeing you today!!  -Lab work today; will notify you once results are available.  -Tdap today.  -Remember your shingles vaccine.  -See you back in 1 year or sooner as needed.      Lelon Frohlich, MD [facility]

## 2021-08-24 NOTE — Addendum Note (Signed)
Addended by: Westley Hummer B on: 08/24/2021 08:26 AM   Modules accepted: Orders

## 2021-08-25 ENCOUNTER — Other Ambulatory Visit: Payer: Self-pay | Admitting: Internal Medicine

## 2021-08-25 ENCOUNTER — Encounter: Payer: Self-pay | Admitting: Internal Medicine

## 2021-08-25 ENCOUNTER — Telehealth: Payer: Self-pay

## 2021-08-25 ENCOUNTER — Other Ambulatory Visit (INDEPENDENT_AMBULATORY_CARE_PROVIDER_SITE_OTHER): Payer: BC Managed Care – PPO

## 2021-08-25 DIAGNOSIS — Z0000 Encounter for general adult medical examination without abnormal findings: Secondary | ICD-10-CM | POA: Diagnosis not present

## 2021-08-25 DIAGNOSIS — E559 Vitamin D deficiency, unspecified: Secondary | ICD-10-CM | POA: Insufficient documentation

## 2021-08-25 DIAGNOSIS — R7302 Impaired glucose tolerance (oral): Secondary | ICD-10-CM | POA: Insufficient documentation

## 2021-08-25 DIAGNOSIS — E538 Deficiency of other specified B group vitamins: Secondary | ICD-10-CM | POA: Insufficient documentation

## 2021-08-25 LAB — CBC WITH DIFFERENTIAL/PLATELET
Basophils Absolute: 0 10*3/uL (ref 0.0–0.1)
Basophils Relative: 0.9 % (ref 0.0–3.0)
Eosinophils Absolute: 0.1 10*3/uL (ref 0.0–0.7)
Eosinophils Relative: 1.8 % (ref 0.0–5.0)
HCT: 39.2 % (ref 36.0–46.0)
Hemoglobin: 12.8 g/dL (ref 12.0–15.0)
Lymphocytes Relative: 22.3 % (ref 12.0–46.0)
Lymphs Abs: 1.2 10*3/uL (ref 0.7–4.0)
MCHC: 32.7 g/dL (ref 30.0–36.0)
MCV: 94.8 fl (ref 78.0–100.0)
Monocytes Absolute: 0.4 10*3/uL (ref 0.1–1.0)
Monocytes Relative: 7 % (ref 3.0–12.0)
Neutro Abs: 3.5 10*3/uL (ref 1.4–7.7)
Neutrophils Relative %: 68 % (ref 43.0–77.0)
Platelets: 267 10*3/uL (ref 150.0–400.0)
RBC: 4.14 Mil/uL (ref 3.87–5.11)
RDW: 13.9 % (ref 11.5–15.5)
WBC: 4.8 10*3/uL (ref 4.0–10.5)

## 2021-08-25 MED ORDER — VITAMIN D (ERGOCALCIFEROL) 1.25 MG (50000 UNIT) PO CAPS: 50000.0000 [IU] | ORAL_CAPSULE | ORAL | 0 refills | Status: AC

## 2021-08-26 ENCOUNTER — Other Ambulatory Visit: Payer: Self-pay | Admitting: Internal Medicine

## 2021-08-26 DIAGNOSIS — E559 Vitamin D deficiency, unspecified: Secondary | ICD-10-CM

## 2021-08-30 NOTE — Telephone Encounter (Signed)
error 

## 2021-08-31 ENCOUNTER — Other Ambulatory Visit: Payer: BC Managed Care – PPO

## 2021-08-31 ENCOUNTER — Ambulatory Visit (INDEPENDENT_AMBULATORY_CARE_PROVIDER_SITE_OTHER): Payer: BC Managed Care – PPO

## 2021-08-31 DIAGNOSIS — E538 Deficiency of other specified B group vitamins: Secondary | ICD-10-CM | POA: Diagnosis not present

## 2021-08-31 MED ORDER — CYANOCOBALAMIN 1000 MCG/ML IJ SOLN
1000.0000 ug | Freq: Once | INTRAMUSCULAR | Status: AC
  Administered 2021-08-31: 1000 ug via INTRAMUSCULAR

## 2021-08-31 NOTE — Progress Notes (Signed)
Per orders of Dr. Hernandez , injection of Cyanocobalamin 1,000 mcg/mL given by Pretty Weltman N Desere Gwin. °Patient tolerated injection well. ° °

## 2021-09-06 NOTE — Telephone Encounter (Signed)
Note opened in error.

## 2021-09-07 ENCOUNTER — Ambulatory Visit (INDEPENDENT_AMBULATORY_CARE_PROVIDER_SITE_OTHER): Payer: BC Managed Care – PPO

## 2021-09-07 DIAGNOSIS — E538 Deficiency of other specified B group vitamins: Secondary | ICD-10-CM

## 2021-09-07 MED ORDER — CYANOCOBALAMIN 1000 MCG/ML IJ SOLN
1000.0000 ug | Freq: Once | INTRAMUSCULAR | Status: AC
  Administered 2021-09-07: 1000 ug via INTRAMUSCULAR

## 2021-09-07 NOTE — Progress Notes (Signed)
Per orders from Dr. Jerilee Hoh, pt was given a B12 injection in left deltoid by Madaline Guthrie, patient tolerated well.

## 2021-09-14 ENCOUNTER — Ambulatory Visit (INDEPENDENT_AMBULATORY_CARE_PROVIDER_SITE_OTHER): Payer: BC Managed Care – PPO

## 2021-09-14 DIAGNOSIS — E538 Deficiency of other specified B group vitamins: Secondary | ICD-10-CM | POA: Diagnosis not present

## 2021-09-14 MED ORDER — CYANOCOBALAMIN 1000 MCG/ML IJ SOLN
1000.0000 ug | Freq: Once | INTRAMUSCULAR | Status: AC
  Administered 2021-09-14: 1000 ug via INTRAMUSCULAR

## 2021-09-14 NOTE — Progress Notes (Signed)
Pt here for third weekly B12 injection per Dr Jerilee Hoh.  B12 1061mcg given IM, and pt tolerated injection well.  Next B12 injection scheduled for next week.

## 2021-09-21 ENCOUNTER — Ambulatory Visit (INDEPENDENT_AMBULATORY_CARE_PROVIDER_SITE_OTHER): Payer: BC Managed Care – PPO

## 2021-09-21 ENCOUNTER — Ambulatory Visit: Payer: BC Managed Care – PPO

## 2021-09-21 DIAGNOSIS — E538 Deficiency of other specified B group vitamins: Secondary | ICD-10-CM | POA: Diagnosis not present

## 2021-09-21 MED ORDER — CYANOCOBALAMIN 1000 MCG/ML IJ SOLN
1000.0000 ug | Freq: Once | INTRAMUSCULAR | Status: AC
  Administered 2021-09-21: 1000 ug via INTRAMUSCULAR

## 2021-09-21 NOTE — Progress Notes (Signed)
Per orders of Dr. Hernandez, injection of Cyanocobalamin 1000 mcg given by Alexxus Sobh L Jalea Bronaugh. Patient tolerated injection well.  

## 2021-10-05 ENCOUNTER — Ambulatory Visit (INDEPENDENT_AMBULATORY_CARE_PROVIDER_SITE_OTHER): Payer: BC Managed Care – PPO

## 2021-10-05 DIAGNOSIS — Z23 Encounter for immunization: Secondary | ICD-10-CM | POA: Diagnosis not present

## 2021-10-06 ENCOUNTER — Other Ambulatory Visit: Payer: Self-pay | Admitting: Internal Medicine

## 2021-10-06 DIAGNOSIS — E559 Vitamin D deficiency, unspecified: Secondary | ICD-10-CM

## 2021-10-12 ENCOUNTER — Encounter: Payer: Self-pay | Admitting: Hematology and Oncology

## 2021-10-12 LAB — HM MAMMOGRAPHY

## 2021-10-14 ENCOUNTER — Encounter: Payer: Self-pay | Admitting: Internal Medicine

## 2021-10-26 ENCOUNTER — Ambulatory Visit (INDEPENDENT_AMBULATORY_CARE_PROVIDER_SITE_OTHER): Payer: BC Managed Care – PPO

## 2021-10-26 DIAGNOSIS — E538 Deficiency of other specified B group vitamins: Secondary | ICD-10-CM

## 2021-10-26 MED ORDER — CYANOCOBALAMIN 1000 MCG/ML IJ SOLN
1000.0000 ug | INTRAMUSCULAR | Status: AC
  Administered 2021-10-26 – 2021-11-26 (×2): 1000 ug via INTRAMUSCULAR

## 2021-10-26 NOTE — Progress Notes (Signed)
Pt here for monthly B12 injection per Dr Jerilee Hoh.  B12 1067mcg given IM in left deltoid and pt tolerated injection well.  Next B12 injection scheduled for 11/26/21.

## 2021-11-05 ENCOUNTER — Other Ambulatory Visit: Payer: Self-pay | Admitting: Internal Medicine

## 2021-11-05 DIAGNOSIS — E559 Vitamin D deficiency, unspecified: Secondary | ICD-10-CM

## 2021-11-08 ENCOUNTER — Telehealth: Payer: Self-pay

## 2021-11-08 NOTE — Telephone Encounter (Addendum)
WF [id number] Understanding and Predicting Breast Cancer Events after Treatment (UPBEAT)  43 Month Call   Spoke with Ms Nations via phone to complete the 36 month follow up. She has not been admitted to the hospital and has had no cardiovascular issues since our last call. Patient requested [facility] email her the [facility]; sent the request to [facility].  Marjie Skiff Bradie Lacock, RN, BSN, [facility] She   Her   Hers Clinical Research Nurse [facility] Direct Dial [phone]   Pager [phone] 11/08/2021 3:08 PM     11/09/2021 Confirmed that KCCQ-12 has been completed online.  Marjie Skiff Dimples Probus, RN, BSN, [facility] She   Her   Hers Clinical Research Nurse [facility] Direct Dial [phone]   Pager [phone] 11/09/2021 1:26 PM

## 2021-11-23 ENCOUNTER — Telehealth: Payer: Self-pay | Admitting: *Deleted

## 2021-11-23 NOTE — Telephone Encounter (Signed)
RN placed call to pt to review recent bone density from [facility] showing T score -1.0  No answer, LVM for pt to return call tot he office.

## 2021-11-26 ENCOUNTER — Ambulatory Visit (INDEPENDENT_AMBULATORY_CARE_PROVIDER_SITE_OTHER): Payer: BC Managed Care – PPO

## 2021-11-26 DIAGNOSIS — E538 Deficiency of other specified B group vitamins: Secondary | ICD-10-CM

## 2021-11-26 NOTE — Progress Notes (Signed)
Pt here for monthly B12 injection per Dr Jerilee Hoh  B12 1029mcg given IM left deltoid and pt tolerated injection well.  Pt has OV scheduled with PCP on 12/23/21.  Dr Volanda Napoleon: please cosign since PCP is out of office.

## 2021-12-06 ENCOUNTER — Encounter: Payer: Self-pay | Admitting: Hematology and Oncology

## 2021-12-23 ENCOUNTER — Encounter: Payer: Self-pay | Admitting: Internal Medicine

## 2021-12-23 ENCOUNTER — Ambulatory Visit: Payer: BC Managed Care – PPO | Admitting: Internal Medicine

## 2021-12-23 VITALS — BP 110/80 | HR 88 | Temp 97.6°F | Wt 176.2 lb

## 2021-12-23 DIAGNOSIS — R7302 Impaired glucose tolerance (oral): Secondary | ICD-10-CM | POA: Diagnosis not present

## 2021-12-23 DIAGNOSIS — C50211 Malignant neoplasm of upper-inner quadrant of right female breast: Secondary | ICD-10-CM | POA: Diagnosis not present

## 2021-12-23 DIAGNOSIS — Z170 Estrogen receptor positive status [ER+]: Secondary | ICD-10-CM

## 2021-12-23 DIAGNOSIS — E538 Deficiency of other specified B group vitamins: Secondary | ICD-10-CM

## 2021-12-23 DIAGNOSIS — E559 Vitamin D deficiency, unspecified: Secondary | ICD-10-CM

## 2021-12-23 LAB — POCT GLYCOSYLATED HEMOGLOBIN (HGB A1C): Hemoglobin A1C: 5.3 % (ref 4.0–5.6)

## 2021-12-23 LAB — VITAMIN B12: Vitamin B-12: 790 pg/mL (ref 211–911)

## 2021-12-23 LAB — VITAMIN D 25 HYDROXY (VIT D DEFICIENCY, FRACTURES): VITD: 40.03 ng/mL (ref 30.00–100.00)

## 2021-12-23 NOTE — Progress Notes (Signed)
? ? ? ?Established Patient Office Visit ? ? ? ? ?This visit occurred during the SARS-CoV-2 public health emergency.  Safety protocols were in place, including screening questions prior to the visit, additional usage of staff PPE, and extensive cleaning of exam room while observing appropriate contact time as indicated for disinfecting solutions.  ? ? ?CC/Reason for Visit: Follow-up chronic conditions ? ?HPI: Judith Thomas is a 66 y.o. female who is coming in today for the above mentioned reasons. Past Medical History is significant for: Impaired glucose tolerance, vitamin D and B12 deficiency, she is a breast cancer survivor.  She is feeling well today and has no acute concerns.  She is wondering whether she can transition to oral B12 supplementation as insurance is not covering her injections.  She has lost about 10 pounds since her last visit through lifestyle changes. ? ? ?Past Medical/Surgical History: ?Past Medical History:  ?Diagnosis Date  ? Breast cancer ([place])   ? right  ? Family history of bladder cancer   ? Family history of breast cancer   ? Knee pain, chronic 10/11/2015  ? ? ?Past Surgical History:  ?Procedure Laterality Date  ? BREAST LUMPECTOMY WITH RADIOACTIVE SEED AND SENTINEL LYMPH NODE BIOPSY Right 03/08/2019  ? Procedure: RIGHT BREAST RADIOACTIVE SEED LUMPECTOMY X2 AND SENTINEL LYMPH NODE BIOPSY;  Surgeon: Jovita Kussmaul, MD;  Location: [place];  Service: General;  Laterality: Right;  ? COLONOSCOPY  03/2010  ? none    ? PORT-A-CATH REMOVAL Left 12/30/2019  ? Procedure: REMOVAL PORT-A-CATH;  Surgeon: Jovita Kussmaul, MD;  Location: [place];  Service: General;  Laterality: Left;  ? PORTACATH PLACEMENT Left 11/07/2018  ? Procedure: INSERTION PORT-A-CATH WITH ULTRASOUND;  Surgeon: Jovita Kussmaul, MD;  Location: [place];  Service: General;  Laterality: Left;  ? ? ?Social History: ? reports that she has never smoked. She has never used smokeless  tobacco. She reports current alcohol use. She reports that she does not use drugs. ? ?Allergies: ?Allergies  ?Allergen Reactions  ? Paclitaxel Other (See Comments)  ?  Nausea, flushing  ? ? ?Family History:  ?Family History  ?Problem Relation Age of Onset  ? Liver disease Mother   ? Heart disease Mother   ?     afib  ? Heart disease Father   ? Asthma Father   ? Diabetes Father   ? Bladder Cancer Father   ?     smoker  ? Breast cancer Maternal Aunt   ? Breast cancer Maternal Aunt   ?     mets to bone  ? Heart disease Maternal Grandfather   ? Kidney disease Paternal Grandmother   ? Heart disease Paternal Grandfather   ? Heart disease Other   ? Colon cancer Neg Hx   ? Colon polyps Neg Hx   ? Esophageal cancer Neg Hx   ? Rectal cancer Neg Hx   ? Stomach cancer Neg Hx   ? ? ? ?Current Outpatient Medications:  ?  anastrozole (ARIMIDEX) 1 MG tablet, Take 1 tablet (1 mg total) by mouth daily., Disp: 90 tablet, Rfl: 3 ?  Calcium Carbonate-Vit D-Min (CALCIUM 1200 PO), Take by mouth., Disp: , Rfl:  ?  cholecalciferol (VITAMIN D3) 25 MCG (1000 UNIT) tablet, Take 1,000 Units by mouth daily., Disp: , Rfl:  ?  fluticasone 0.05%-ketoconazole 2% 1:1 cream mixture, apply TO THE AFFECTED AREA TWICE DAILY AS NEEDED, Disp: , Rfl:  ?  Multiple Vitamin (  MULTIVITAMIN) tablet, Take 1 tablet by mouth daily. , Disp: , Rfl:  ?  Polyethyl Glycol-Propyl Glycol (SYSTANE FREE OP), Apply to eye at bedtime as needed., Disp: , Rfl:  ? ?Current Facility-Administered Medications:  ?  cyanocobalamin ((VITAMIN B-12)) injection 1,000 mcg, 1,000 mcg, Intramuscular, Q30 days, Isaac Bliss, Rayford Halsted, MD, 1,000 mcg at 11/26/21 1028 ? ?Review of Systems:  ?Constitutional: Denies fever, chills, diaphoresis, appetite change and fatigue.  ?HEENT: Denies photophobia, eye pain, redness, hearing loss, ear pain, congestion, sore throat, rhinorrhea, sneezing, mouth sores, trouble swallowing, neck pain, neck stiffness and tinnitus.   ?Respiratory: Denies SOB, DOE,  cough, chest tightness,  and wheezing.   ?Cardiovascular: Denies chest pain, palpitations and leg swelling.  ?Gastrointestinal: Denies nausea, vomiting, abdominal pain, diarrhea, constipation, blood in stool and abdominal distention.  ?Genitourinary: Denies dysuria, urgency, frequency, hematuria, flank pain and difficulty urinating.  ?Endocrine: Denies: hot or cold intolerance, sweats, changes in hair or nails, polyuria, polydipsia. ?Musculoskeletal: Denies myalgias, back pain, joint swelling, arthralgias and gait problem.  ?Skin: Denies pallor, rash and wound.  ?Neurological: Denies dizziness, seizures, syncope, weakness, light-headedness, numbness and headaches.  ?Hematological: Denies adenopathy. Easy bruising, personal or family bleeding history  ?Psychiatric/Behavioral: Denies suicidal ideation, mood changes, confusion, nervousness, sleep disturbance and agitation ? ? ? ?Physical Exam: ?Vitals:  ? 12/23/21 0955  ?BP: 110/80  ?Pulse: 88  ?Temp: 97.6 ?F (36.4 ?C)  ?TempSrc: Oral  ?SpO2: 99%  ?Weight: 176 lb 3.2 oz (79.9 kg)  ? ? ?Body mass index is 29.32 kg/m?. ? ? ?Constitutional: NAD, calm, comfortable ?Eyes: PERRL, lids and conjunctivae normal ?ENMT: Mucous membranes are moist.  ?Respiratory: clear to auscultation bilaterally, no wheezing, no crackles. Normal respiratory effort. No accessory muscle use.  ?Cardiovascular: Regular rate and rhythm, no murmurs / rubs / gallops. No extremity edema.  ?Neurologic: Grossly intact and nonfocal ?Psychiatric: Normal judgment and insight. Alert and oriented x 3. Normal mood.  ? ? ?Impression and Plan: ? ?IGT (impaired glucose tolerance)  ?- Plan: POCT glycosylated hemoglobin (Hb A1C) ?-A1c has improved to 5.3, I am encouraged by her lifestyle changes. ? ?Vitamin D deficiency ? - Plan: VITAMIN D 25 Hydroxy (Vit-D Deficiency, Fractures) ? ?Vitamin B12 deficiency  ?- Plan: Vitamin B12 ?-She will transition to daily oral supplementation.  Recheck levels when she returns for  CPE. ? ?Malignant neoplasm of upper-inner quadrant of right breast in female, estrogen receptor positive ([place]) ?-Remains on anastrozole, followed by oncology ? ?Time spent: 30 minutes reviewing chart, interviewing and examining patient and formulating plan of care. ? ? ? ?Lelon Frohlich, MD ?[facility] ? ? ?

## 2022-01-05 ENCOUNTER — Ambulatory Visit (INDEPENDENT_AMBULATORY_CARE_PROVIDER_SITE_OTHER): Payer: BC Managed Care – PPO

## 2022-01-05 DIAGNOSIS — Z23 Encounter for immunization: Secondary | ICD-10-CM

## 2022-01-20 ENCOUNTER — Telehealth: Payer: Self-pay | Admitting: Hematology and Oncology

## 2022-01-20 NOTE — Telephone Encounter (Signed)
Per provider called and left message to reschedule appointment left details of appointment and call back number if changes are needed  ?

## 2022-01-24 ENCOUNTER — Other Ambulatory Visit: Payer: Self-pay

## 2022-01-24 ENCOUNTER — Ambulatory Visit (INDEPENDENT_AMBULATORY_CARE_PROVIDER_SITE_OTHER): Payer: Medicare PPO | Admitting: Family

## 2022-01-24 ENCOUNTER — Encounter (INDEPENDENT_AMBULATORY_CARE_PROVIDER_SITE_OTHER): Payer: Self-pay | Admitting: Family

## 2022-01-24 ENCOUNTER — Encounter (INDEPENDENT_AMBULATORY_CARE_PROVIDER_SITE_OTHER): Payer: Self-pay

## 2022-01-24 VITALS — BP 165/91 | HR 63 | Temp 96.8°F | Resp 16 | Ht 63.0 in | Wt 147.0 lb

## 2022-01-24 DIAGNOSIS — I2510 Atherosclerotic heart disease of native coronary artery without angina pectoris: Secondary | ICD-10-CM

## 2022-01-24 DIAGNOSIS — E785 Hyperlipidemia, unspecified: Secondary | ICD-10-CM | POA: Insufficient documentation

## 2022-01-24 DIAGNOSIS — Z955 Presence of coronary angioplasty implant and graft: Secondary | ICD-10-CM

## 2022-01-24 DIAGNOSIS — I10 Essential (primary) hypertension: Secondary | ICD-10-CM | POA: Insufficient documentation

## 2022-01-24 DIAGNOSIS — Z720 Tobacco use: Secondary | ICD-10-CM

## 2022-01-24 MED ORDER — ASPIRIN 81 MG TABLET,DELAYED RELEASE
81.0000 mg | DELAYED_RELEASE_TABLET | Freq: Every day | ORAL | Status: DC
Start: 2022-01-24 — End: 2023-02-07

## 2022-01-24 NOTE — Progress Notes (Signed)
Cardiology [place], [facility]  [address]  [place] [address]  [phone]    Cardiology  Clinic Note    Name: [name]   DOB: 16-Dec-1955  [66 y.o. female]   MRN: [id number]       Visit Date: 01/24/2022   Referring: No referring provider defined for this encounter.   PCP: Purvis Kilts, PA-C         Chief Complaint: Hospital Follow Up      History of Present Illness   [name] is a 66 y.o. White female who presents for hospital follow up. PMH includes HTN, COPD, HLD, and CAD s/p PCI. LHC by Dr Bailey Mech in 2019 with PCI to LAD with Promus DES x3.    She was admitted to [facility] in Feb 2023 for evaluation of cough, body aches, SOB x7 days. Dr Bailey Mech consulted re: chest pain and dyspnea. During hospitalization, she completed stress test showing no ischemia. Echo showed good EF with normal wall motion.     She tells me she has been doing good since her discharge. BP initially elevated, but recheck was 140/82. She tells me she is having some arthritis pain this morning after working on cleaning her siding this weekend. She tells me Dr Tawnya Crook stopped her aspirin and Plavix in 2020. We discussed importance of aspirin 81 mg daily, indefinitely. She verbalizes understanding and will start taking. Her PCP checks routine blood work, including lipid panel. She does continue to smoke.       Patient Active Problem List    Diagnosis Date Noted   . CAD in native artery 01/24/2022   . Essential hypertension 01/24/2022   . Hyperlipidemia 01/24/2022   . Tobacco use 01/24/2022   . History of coronary artery stent placement 01/24/2022       Allergies  No Known Allergies    Medications    Current Outpatient Medications:   .  albuterol sulfate (PROVENTIL OR VENTOLIN OR PROAIR) 90 mcg/actuation Inhalation oral inhaler, , Disp: , Rfl:   .  albuterol sulfate (PROVENTIL) 2.5 mg /3 mL (0.083 %) Inhalation nebulizer solution, , Disp: , Rfl:   .  aspirin (ECOTRIN)  81 mg Oral Tablet, Delayed Release (E.C.), Take 1 Tablet (81 mg total) by mouth Once a day, Disp: , Rfl:   .  atorvastatin (LIPITOR) 40 mg Oral Tablet, Take 1 Tablet (40 mg total) by mouth Once a day, Disp: , Rfl:   .  clonazePAM (KLONOPIN) 0.5 mg Oral Tablet, Take 1 Tablet (0.5 mg total) by mouth Once a day, Disp: , Rfl:   .  fluticasone propionate (FLONASE) 50 mcg/actuation Nasal Spray, Suspension, , Disp: , Rfl:   .  metoprolol tartrate (LOPRESSOR) 25 mg Oral Tablet, Take 1 Tablet (25 mg total) by mouth Twice daily, Disp: , Rfl:   .  sertraline (ZOLOFT) 100 mg Oral Tablet, Take 1 Tablet (100 mg total) by mouth Twice daily, Disp: , Rfl:   .  traZODone (DESYREL) 150 mg Oral Tablet, Take 1 Tablet (150 mg total) by mouth Every night, Disp: , Rfl:     History  Past Medical History:   Diagnosis Date   . Chronic lung disease    . Coronary artery disease    . Dyslipidemia    . Essential hypertension    . History of percutaneous coronary intervention    . History of stress test    . Unspecified chronic bronchitis (CMS HCC)  Past Surgical History:   Procedure Laterality Date   . CARDIAC CATHETERIZATION     . CORONARY ANGIOPLASTY     . CORONARY ARTERY STENT PLACEMENT     . HX BACK SURGERY     . HX TUBAL LIGATION     . NECK SURGERY       Social History     Socioeconomic History   . Marital status: Widowed   Tobacco Use   . Smoking status: Every Day     Packs/day: 0.50     Types: Cigarettes   . Smokeless tobacco: Never   Vaping Use   . Vaping Use: Never used   Substance and Sexual Activity   . Alcohol use: Yes     Comment: occasional   . Drug use: Yes     Types: Marijuana     Family Medical History:     Problem Relation (Age of Onset)    Cancer Father    Heart Attack Father    Heart Disease Brother    Pacemaker Mother        Review of Systems:  General: Denies fever or chills. No recent weight change.  CNS: Denies syncope or dizziness. No headache.  Eyes: Denies recent visual changes. No pain.  Ears, Nose, and Throat:  Denies ear pain, nasal congestion, or sore throat.  Endocrine: Denies excessive hunger or thirst. No heat/cold intolerance.  Respiratory: Denies shortness of breath and cough.  Cardiovascular: Per HPI Denies claudication or varicose veins.  GI: Denies nausea/vomiting. No black/bloody bowel movements.  GU: Denies hematuria or dysuria. No incontinence.  Musculoskeletal: + arthritis pain. No joint swelling or decreased ROM.  Skin: No rashes, hives, or eczema.  Psychiatric: Denies anxiety, depression, or insomnia.  Hematologic: No bleedings disorders or swollen nodes.    All other systems reviewed and either negative or not pertinent.     Physical Examination:  BP (!) 165/91 (Site: Left, Patient Position: Sitting, Cuff Size: Adult)   Pulse 63   Temp 36 C (96.8 F)   Resp 16   Ht 1.6 m (5\' 3" )   Wt 66.7 kg (147 lb)   SpO2 98%   BMI 26.04 kg/m     General: Awake. Well nourished. No acute distress. A&O x 3.  HEENT: Normocephalic, atraumatic. PERRL. Normal hearing.  Neck: No JVD or bruit. Trachea midline.  Lungs: Clear to auscultation. No rhonchi or wheezing noted. Unlabored.  Heart: Regular rate and rhythm. No murmurs, rubs, or gallops are appreciated.  Abdomen: BS normal x 4 quadrants. Soft, non-tender, non-distended.  Extremities: No clubbing, cyanosis, or edema.  Psychiatric: Cooperative, appropriate mood and affect.  Musculoskeletal: Strength grips are equal. No red, swollen joints. MAE.  Skin: Warm, dry, and intact. No rashes or hives are noted.     Diagnostics  Echo:  12/05/2021  Summary:Leftventricle:Thecavitysizeisnormal.Wallthicknessisnormal.Systolicfunctionisnormal.Theestimated  ejectionfractionis5055%.Wallmotionisnormal?therearenoregionalwallmotionabnormalities.Leftventriculardiastolic  functionparametersarenormal.    Stress Test:  Feb 2023  CONCLUSION:  1. No evidence of ischemia or significant infarction was detected on this study. (This small linear mild decrease  in perfusion at the RV insertion site in the mid and apical anterior segments is noted which appears be artifactual. It is not reversible. This area contracts and thickens normally suggesting no significant injury has occurred.)  2. Normal left ventricular chamber size and contractility with a 77% ejection fraction the gated study.  3. The right ventricle appeared in size and contractility.  4. The EKG portion of the study reportedly was negative for ischemia.  5. The patient  had no chest symptoms with infusion.    Catherization:  LHC by Dr Bailey Mech in Sept 2019  Lamar epicardial arteries appear to very calcified by fluoroscopy.  Right coronary: The right coronary artery is a dominant artery giving rise to PDA and posterolateral branch. The right coronary artery, PDA, and posterolateral branch have 20/30% diffuse disease.  Left main coronary: The left main has no radiographic demonstrable disease.  Left anterior descending coronary: The LAD is a Moderate caliber artery giving rise to Small diagonals. LAD Has a 60% narrowing Early mid followed by diffuse 40-30% diffuse disease followed with a 90% mid lesion and 50% distal lesion.  Circumflex coronary: The circumflex gives rise to 2 OMs. Circumflex proper, OM1, and OM2 have 20-30% diffuse disease.  CONCLUSIONS:  1. Severe LAD disease with mild right coronary artery and circumflex disease  2. No Aortic stenosis on pullback gradient    Underwent PCI to LAD with 2.25 x 20 mm Promus DES, followed by 2.5 x 20 mm Promus DES deployed to the proximal lesion site.  Small dissection distal to the proximal stent. At that point the lesion was rewired without complications and a 2.5 x 15 mm stent was advanced to the dissection site and covered the distal portion of the proximal Portion of the distal stent.     Orders Placed This Encounter   . aspirin (ECOTRIN) 81 mg Oral Tablet, Delayed Release (E.C.)       There are no discontinued medications.    Assessment and  Plan:  Assessment/Plan   1. CAD in native artery    2. History of coronary artery stent placement    3. Essential hypertension    4. Hyperlipidemia    5. Tobacco use        PLAN: Patient with recent stress test showing no ischemia and echo showing good EF with normal wall motion during admission to [facility] in Feb 2023. She denies any chest pain or dyspnea. She tells me she bought a home and has been working on it to fix it up. Her BP was initially elevated, but on recheck was 140/82. She was not currently taking aspirin or Plavix. We discussed her history of CAD and importance of aspirin 81 mg daily, indefinitely. She verbalized understanding and will start taking.       Follow up:  Return in about 1 year (around 01/25/2023).        Pia Mau, APRN,NP-C  [facility]    A portion of this documentation may have been generated using [facility] voice recognition software and may contain syntax/voice recognition errors.

## 2022-05-15 DIAGNOSIS — C50911 Malignant neoplasm of unspecified site of right female breast: Secondary | ICD-10-CM | POA: Diagnosis not present

## 2022-06-17 DIAGNOSIS — L820 Inflamed seborrheic keratosis: Secondary | ICD-10-CM | POA: Diagnosis not present

## 2022-06-17 DIAGNOSIS — B078 Other viral warts: Secondary | ICD-10-CM | POA: Diagnosis not present

## 2022-07-04 ENCOUNTER — Encounter: Payer: Self-pay | Admitting: Hematology and Oncology

## 2022-07-06 ENCOUNTER — Ambulatory Visit (INDEPENDENT_AMBULATORY_CARE_PROVIDER_SITE_OTHER): Payer: Medicare HMO | Admitting: *Deleted

## 2022-07-06 ENCOUNTER — Encounter: Payer: Self-pay | Admitting: Hematology and Oncology

## 2022-07-06 DIAGNOSIS — Z23 Encounter for immunization: Secondary | ICD-10-CM | POA: Diagnosis not present

## 2022-07-12 NOTE — Progress Notes (Signed)
Patient Care Team: Isaac Bliss, Rayford Halsted, MD as PCP - General (Internal Medicine) Mauro Kaufmann, RN as Oncology Nurse Navigator Rockwell Germany, RN as Oncology Nurse Navigator Eppie Gibson, MD as Attending Physician (Radiation Oncology) Nicholas Lose, MD as Consulting Physician (Hematology and Oncology) Jovita Kussmaul, MD as Consulting Physician (General Surgery) Gwyndolyn Kaufman, RN (Inactive) as Registered Nurse  DIAGNOSIS: No diagnosis found.  SUMMARY OF ONCOLOGIC HISTORY: Oncology History  Malignant neoplasm of upper-inner quadrant of right breast in female, estrogen receptor positive ([place])  09/26/2018 Initial Diagnosis   Two right breast masses 12:00 to 1 o'clock position 1.3 cm and 1.2 cm, they are 1.5 cm apart.  Biopsy revealed grade 2 invasive ductal carcinoma ER 70%- 100%, PR 0% -20%, Ki-67 20%, HER-2 3+ by IHC and FISH ratio 2.15 with a gene copy number of 4.2 for the tumor that was 2+ by IHC, T1c N0 stage Ia   10/24/2018 Cancer Staging   Staging form: Breast, AJCC 8th Edition - Clinical stage from 10/24/2018: Stage IA (cT1c(2), cN0, cM0, G2, ER+, PR+, HER2+) - Signed by Nicholas Lose, MD on 10/24/2018   11/15/2018 - 10/24/2019 Chemotherapy   PACLitaxel-protein bound (ABRAXANE) chemo infusion 150 mg, 80 mg/m2 = 150 mg (100 % of original dose 80 mg/m2), Intravenous,  Once, 3 of 3 cycles. Dose modification: 80 mg/m2 (original dose 80 mg/m2, Cycle 1). Administration: 150 mg (11/29/2018), 150 mg (12/06/2018), 150 mg (12/13/2018), 150 mg (12/20/2018), 150 mg (12/27/2018), 150 mg (01/03/2019), 150 mg (01/10/2019), 150 mg (01/17/2019), 150 mg (01/24/2019)  ondansetron (ZOFRAN) 8 mg in sodium chloride 0.9 % 50 mL IVPB, 8 mg (100 % of original dose 8 mg), Intravenous,  Once, 1 of 1 cycle. Dose modification: 8 mg (original dose 8 mg, Cycle 1)  trastuzumab (HERCEPTIN) 336 mg in sodium chloride 0.9 % 250 mL chemo infusion, 4 mg/kg = 336 mg, Intravenous,  Once, 5 of 16 cycles. Dose modification:  6 mg/kg (original dose 2 mg/kg, Cycle 3, Reason: Other (see comments), Comment: changing to q3week tx). Administration: 336 mg (11/08/2018), 168 mg (11/15/2018), 168 mg (12/06/2018), 504 mg (02/14/2019), 504 mg (03/05/2019), 168 mg (11/22/2018), 168 mg (11/29/2018), 168 mg (12/13/2018), 168 mg (12/20/2018), 168 mg (12/27/2018), 168 mg (01/03/2019), 168 mg (01/10/2019), 168 mg (01/17/2019), 504 mg (01/24/2019)  PACLitaxel (TAXOL) 156 mg in sodium chloride 0.9 % 250 mL chemo infusion (</= 38m/m2), 80 mg/m2 = 156 mg, Intravenous,  Once, 1 of 1 cycle. Administration: 156 mg (11/08/2018), 156 mg (11/15/2018), 156 mg (11/22/2018)  ado-trastuzumab emtansine (KADCYLA) 300 mg in sodium chloride 0.9 % 250 mL chemo infusion, 3.6 mg/kg = 300 mg, Intravenous, Once, 11 of 11 cycles. Administration: 300 mg (03/28/2019), 300 mg (04/18/2019), 300 mg (06/21/2019), 300 mg (05/09/2019), 300 mg (05/30/2019), 300 mg (07/11/2019), 300 mg (08/01/2019), 300 mg (08/22/2019), 300 mg (09/12/2019), 260 mg (10/02/2019), 260 mg (10/24/2019)    01/09/2019 Genetic Testing   Negative genetic testing on the common hereditary cancer panel. The Hereditary Gene Panel offered by [facility] includes sequencing and/or deletion duplication testing of the following 48 genes: APC, ATM, AXIN2, BARD1, BMPR1A, BRCA1, BRCA2, BRIP1, CDH1, CDK4, CDKN2A (p14ARF), CDKN2A (p16INK4a), CHEK2, CTNNA1, DICER1, EPCAM (Deletion/duplication testing only), GREM1 (promoter region deletion/duplication testing only), KIT, MEN1, MLH1, MSH2, MSH3, MSH6, MUTYH, NBN, NF1, NHTL1, PALB2, PDGFRA, PMS2, POLD1, POLE, PTEN, RAD50, RAD51C, RAD51D, RNF43, SDHB, SDHC, SDHD, SMAD4, SMARCA4. STK11, TP53, TSC1, TSC2, and VHL.  The following genes were evaluated for sequence changes only: SDHA and HOXB13 c.251G>A  variant only. The report date is January 09, 2019.   03/08/2019 Surgery   Right lumpectomy [name]) [phone]): IDC s/p neoadjuvant treatment, grade 2, 0.7cm, ER+ 100%, PR+ 5%, HER2 equivocal, Ki67 20%, 2 right  axillary sentinel lymph nodes negative for carcinoma, with clear margins.    03/29/2019 Cancer Staging   Staging form: Breast, AJCC 8th Edition - Pathologic stage from 03/29/2019: No Stage Recommended (ypT1b, pN0, cM0, G2, ER+, PR+, HER2: Equivocal)     04/08/2019 - 05/06/2019 Radiation Therapy   The patient initially received a dose of 40.05 Gy in 15 fractions to the breast using whole-breast tangent fields. This was delivered using a 3-D conformal technique. The pt received a boost delivering an additional 10 Gy in 5 fractions using a electron boost with 95mV electrons. The total dose was 50.05 Gy.    09/2019 - 09/2024 Anti-estrogen oral therapy   Anastrozole     CHIEF COMPLIANT: Follow-up of right breast cancer on anastrozole  INTERVAL HISTORY: Judith Thomas a 66y.o. with above-mentioned history of right breast cancer treated with neoadjuvant chemotherapy, lumpectomy, radiation, Kadcyla maintenance, and is currently on antiestrogen therapy with anastrozole. She is a participant in the [employer] clinical trial. She presents to the clinic today for follow-up.     ALLERGIES:  is allergic to paclitaxel.  MEDICATIONS:  Current Outpatient Medications  Medication Sig Dispense Refill   anastrozole (ARIMIDEX) 1 MG tablet Take 1 tablet (1 mg total) by mouth daily. 90 tablet 3   Calcium Carbonate-Vit D-Min (CALCIUM 1200 PO) Take by mouth.     cholecalciferol (VITAMIN D3) 25 MCG (1000 UNIT) tablet Take 1,000 Units by mouth daily.     fluticasone 0.05%-ketoconazole 2% 1:1 cream mixture apply TO THE AFFECTED AREA TWICE DAILY AS NEEDED     Multiple Vitamin (MULTIVITAMIN) tablet Take 1 tablet by mouth daily.      Polyethyl Glycol-Propyl Glycol (SYSTANE FREE OP) Apply to eye at bedtime as needed.     Current Facility-Administered Medications  Medication Dose Route Frequency Provider Last Rate Last Admin   cyanocobalamin ((VITAMIN B-12)) injection 1,000 mcg  1,000 mcg Intramuscular Q30 days  HIsaac Bliss ERayford Halsted MD   1,000 mcg at 11/26/21 1028    PHYSICAL EXAMINATION: ECOG PERFORMANCE STATUS: {CHL ONC ECOG PS:[phone]}  There were no vitals filed for this visit. There were no vitals filed for this visit.  BREAST:*** No palpable masses or nodules in either right or left breasts. No palpable axillary supraclavicular or infraclavicular adenopathy no breast tenderness or nipple discharge. (exam performed in the presence of a chaperone)  LABORATORY DATA:  I have reviewed the data as listed    Latest Ref Rng & Units 08/24/2021    8:25 AM 08/18/2020   10:27 AM 10/24/2019    8:10 AM  CMP  Glucose 70 - 99 mg/dL 96  84  95   BUN 6 - 23 mg/dL _0 Creatinine 0.40 - 1.20 mg/dL 0.72  0.79  0.70   Sodium 135 - 145 mEq/L 139  142  140   Potassium 3.5 - 5.1 mEq/L 4.2  5.0  4.1   Chloride 96 - 112 mEq/L 102  101  105   CO2 19 - 32 mEq/L _1 Calcium 8.4 - 10.5 mg/dL 9.5  9.8  8.9   Total Protein 6.0 - 8.3 g/dL 7.6  7.5  7.7   Total Bilirubin 0.2 - 1.2 mg/dL 0.7  0.7  1.0   Alkaline Phos 39 - 117 U/L 143  161  168   AST 0 - 37 U/L 15  18  39   ALT 0 - 35 U/L _0 Lab Results  Component Value Date   WBC 4.8 08/25/2021   HGB 12.8 08/25/2021   HCT 39.2 08/25/2021   MCV 94.8 08/25/2021   PLT 267.0 08/25/2021   NEUTROABS 3.5 08/25/2021    ASSESSMENT & PLAN:  No problem-specific Assessment & Plan notes found for this encounter.    No orders of the defined types were placed in this encounter.  The patient has a good understanding of the overall plan. she agrees with it. she will call with any problems that may develop before the next visit here. Total time spent: 30 mins including face to face time and time spent for planning, charting and co-ordination of care   Suzzette Righter, [place] 07/12/22    I Gardiner Coins am scribing for Dr. Lindi Adie  ***

## 2022-07-13 ENCOUNTER — Ambulatory Visit: Payer: BC Managed Care – PPO | Admitting: Hematology and Oncology

## 2022-07-14 ENCOUNTER — Encounter: Payer: Self-pay | Admitting: Hematology and Oncology

## 2022-07-14 ENCOUNTER — Inpatient Hospital Stay: Payer: Medicare HMO | Attending: Hematology and Oncology | Admitting: Hematology and Oncology

## 2022-07-14 DIAGNOSIS — Z9221 Personal history of antineoplastic chemotherapy: Secondary | ICD-10-CM | POA: Diagnosis not present

## 2022-07-14 DIAGNOSIS — N951 Menopausal and female climacteric states: Secondary | ICD-10-CM | POA: Insufficient documentation

## 2022-07-14 DIAGNOSIS — Z79811 Long term (current) use of aromatase inhibitors: Secondary | ICD-10-CM | POA: Insufficient documentation

## 2022-07-14 DIAGNOSIS — Z170 Estrogen receptor positive status [ER+]: Secondary | ICD-10-CM | POA: Insufficient documentation

## 2022-07-14 DIAGNOSIS — C50211 Malignant neoplasm of upper-inner quadrant of right female breast: Secondary | ICD-10-CM | POA: Diagnosis not present

## 2022-07-14 DIAGNOSIS — Z923 Personal history of irradiation: Secondary | ICD-10-CM | POA: Diagnosis not present

## 2022-07-14 MED ORDER — ANASTROZOLE 1 MG PO TABS: 1.0000 mg | ORAL_TABLET | Freq: Every day | ORAL | 3 refills | Status: DC

## 2022-07-14 NOTE — Assessment & Plan Note (Signed)
09/27/19:Two right breast masses 12:00 to 1 o'clock position 1.3 cm and 1.2 cm, they are 1.5 cm apart. 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 Neoadjuvant chemotherapy withTaxol Herceptin weekly x12completed 4/16/2020followed by Herceptin maintenance vsKadcylafor 1 yearcompleted 10/24/2019 2.03/08/2019: Right lumpectomy IDC s/p neoadjuvant treatment, grade 2, 0.7cm, ER+ 100%, PR+ 5%, HER2 equivocal, Ki67 20%, 0/2 right axillary sentinel lymph nodes negative for carcinoma, with clear margins. 3. Followed by adjuvant radiation therapycompleted 05/06/2019 4.Followed by antiestrogen therapystarted 10/02/2019 ------------------------------------------------------------------------------------------------------------------------------------------------------------ CurrentTreatment:Anastrozole 1 mg daily started 10/01/20  Anastrozole Toxicities: 1.Mild to moderate hot flashes 2.mild to moderate joint stiffness: Improved with exercise 3.Weight issues:   Bone density has been ordered for February 2023.  Breast Cancer Surveillance: 1. Breast Exam: 07/14/2022: Benign 2. Mammograms:  [facility] 10/14/2021: Benign, breast density category A  Bone density 12/21/2021: T score -1: Normal   RTC in 1 year for follow up

## 2022-07-15 ENCOUNTER — Telehealth: Payer: Self-pay | Admitting: Hematology and Oncology

## 2022-07-15 NOTE — Telephone Encounter (Signed)
Scheduled appointment per 10/5 los. [name] aware.

## 2022-07-16 ENCOUNTER — Encounter: Payer: Self-pay | Admitting: Hematology and Oncology

## 2022-08-11 ENCOUNTER — Telehealth (INDEPENDENT_AMBULATORY_CARE_PROVIDER_SITE_OTHER): Payer: Medicare HMO | Admitting: Internal Medicine

## 2022-08-11 ENCOUNTER — Ambulatory Visit (INDEPENDENT_AMBULATORY_CARE_PROVIDER_SITE_OTHER): Payer: Medicare HMO | Admitting: Internal Medicine

## 2022-08-11 ENCOUNTER — Encounter: Payer: Self-pay | Admitting: Internal Medicine

## 2022-08-11 VITALS — BP 110/70 | HR 86 | Temp 98.2°F | Ht 64.5 in | Wt 173.2 lb

## 2022-08-11 DIAGNOSIS — Z136 Encounter for screening for cardiovascular disorders: Secondary | ICD-10-CM

## 2022-08-11 DIAGNOSIS — C50211 Malignant neoplasm of upper-inner quadrant of right female breast: Secondary | ICD-10-CM

## 2022-08-11 DIAGNOSIS — Z23 Encounter for immunization: Secondary | ICD-10-CM

## 2022-08-11 DIAGNOSIS — Z0000 Encounter for general adult medical examination without abnormal findings: Secondary | ICD-10-CM

## 2022-08-11 DIAGNOSIS — E538 Deficiency of other specified B group vitamins: Secondary | ICD-10-CM | POA: Diagnosis not present

## 2022-08-11 DIAGNOSIS — E559 Vitamin D deficiency, unspecified: Secondary | ICD-10-CM

## 2022-08-11 DIAGNOSIS — R300 Dysuria: Secondary | ICD-10-CM

## 2022-08-11 DIAGNOSIS — R7302 Impaired glucose tolerance (oral): Secondary | ICD-10-CM | POA: Diagnosis not present

## 2022-08-11 DIAGNOSIS — Z170 Estrogen receptor positive status [ER+]: Secondary | ICD-10-CM

## 2022-08-11 LAB — LIPID PANEL
Cholesterol: 137 mg/dL (ref 0–200)
HDL: 58.2 mg/dL (ref >39.00)
LDL Cholesterol: 59 mg/dL (ref 0–99)
NonHDL: 78.67
Total CHOL/HDL Ratio: 2
Triglycerides: 96 mg/dL (ref 0.0–149.0)
VLDL: 19.2 mg/dL (ref 0.0–40.0)

## 2022-08-11 LAB — CBC WITH DIFFERENTIAL/PLATELET
Basophils Absolute: 0 10*3/uL (ref 0.0–0.1)
Basophils Relative: 0.7 % (ref 0.0–3.0)
Eosinophils Absolute: 0.1 10*3/uL (ref 0.0–0.7)
Eosinophils Relative: 1.3 % (ref 0.0–5.0)
HCT: 40 % (ref 36.0–46.0)
Hemoglobin: 13.3 g/dL (ref 12.0–15.0)
Lymphocytes Relative: 19.2 % (ref 12.0–46.0)
Lymphs Abs: 1.1 10*3/uL (ref 0.7–4.0)
MCHC: 33.3 g/dL (ref 30.0–36.0)
MCV: 95.5 fl (ref 78.0–100.0)
Monocytes Absolute: 0.4 10*3/uL (ref 0.1–1.0)
Monocytes Relative: 6.7 % (ref 3.0–12.0)
Neutro Abs: 4.2 10*3/uL (ref 1.4–7.7)
Neutrophils Relative %: 72.1 % (ref 43.0–77.0)
Platelets: 259 10*3/uL (ref 150.0–400.0)
RBC: 4.19 Mil/uL (ref 3.87–5.11)
RDW: 13.2 % (ref 11.5–15.5)
WBC: 5.8 10*3/uL (ref 4.0–10.5)

## 2022-08-11 LAB — COMPREHENSIVE METABOLIC PANEL
ALT: 9 U/L (ref 0–35)
AST: 15 U/L (ref 0–37)
Albumin: 4.5 g/dL (ref 3.5–5.2)
Alkaline Phosphatase: 107 U/L (ref 39–117)
BUN: 9 mg/dL (ref 6–23)
CO2: 30 mEq/L (ref 19–32)
Calcium: 9.9 mg/dL (ref 8.4–10.5)
Chloride: 103 mEq/L (ref 96–112)
Creatinine, Ser: 0.73 mg/dL (ref 0.40–1.20)
GFR: 86.33 mL/min (ref >60.00)
Glucose, Bld: 92 mg/dL (ref 70–99)
Potassium: 4.9 mEq/L (ref 3.5–5.1)
Sodium: 140 mEq/L (ref 135–145)
Total Bilirubin: 0.7 mg/dL (ref 0.2–1.2)
Total Protein: 7.7 g/dL (ref 6.0–8.3)

## 2022-08-11 LAB — VITAMIN B12: Vitamin B-12: 1071 pg/mL — ABNORMAL HIGH (ref 211–911)

## 2022-08-11 LAB — HEMOGLOBIN A1C: Hgb A1c MFr Bld: 5.7 % (ref 4.6–6.5)

## 2022-08-11 LAB — VITAMIN D 25 HYDROXY (VIT D DEFICIENCY, FRACTURES): VITD: 57.72 ng/mL (ref 30.00–100.00)

## 2022-08-11 NOTE — Telephone Encounter (Signed)
Patient was here for CPE today, says when she got home she started feeling discomfort in her abdominal area, saw blood when urinating. Wants to know if she needs to come back or if you will prescribe

## 2022-08-11 NOTE — Addendum Note (Signed)
Addended by: Westley Hummer B on: 08/11/2022 12:00 PM   Modules accepted: Orders

## 2022-08-11 NOTE — Progress Notes (Signed)
Established Patient Office Visit     CC/Reason for Visit: Annual preventive exam and welcome to Medicare visit  HPI: Judith Thomas is a 66 y.o. female who is coming in today for the above mentioned reasons. Past Medical History is significant for: Impaired glucose tolerance, right breast cancer in 2020, vitamin D and B12 deficiencies.  She has been doing well and has no acute concerns or complaints.  She has routine eye and dental care.  She is due for COVID and pneumonia vaccines.  All cancer screening is up-to-date.   Past Medical/Surgical History: Past Medical History:  Diagnosis Date   Breast cancer ([place])    right   Family history of bladder cancer    Family history of breast cancer    Knee pain, chronic 10/11/2015    Past Surgical History:  Procedure Laterality Date   BREAST LUMPECTOMY WITH RADIOACTIVE SEED AND SENTINEL LYMPH NODE BIOPSY Right 03/08/2019   Procedure: RIGHT BREAST RADIOACTIVE SEED LUMPECTOMY X2 AND SENTINEL LYMPH NODE BIOPSY;  Surgeon: Jovita Kussmaul, MD;  Location: [facility];  Service: General;  Laterality: Right;   COLONOSCOPY  03/2010   none     PORT-A-CATH REMOVAL Left 12/30/2019   Procedure: REMOVAL PORT-A-CATH;  Surgeon: Jovita Kussmaul, MD;  Location: [facility];  Service: General;  Laterality: Left;   PORTACATH PLACEMENT Left 11/07/2018   Procedure: INSERTION PORT-A-CATH WITH ULTRASOUND;  Surgeon: Jovita Kussmaul, MD;  Location: [facility];  Service: General;  Laterality: Left;    Social History:  reports that she has never smoked. She has never used smokeless tobacco. She reports current alcohol use. She reports that she does not use drugs.  Allergies: Allergies  Allergen Reactions   Paclitaxel Other (See Comments)    Nausea, flushing    Family History:  Family History  Problem Relation Age of Onset   Liver disease Mother    Heart disease Mother        afib   Heart disease Father     Asthma Father    Diabetes Father    Bladder Cancer Father        smoker   Breast cancer Maternal Aunt    Breast cancer Maternal Aunt        mets to bone   Heart disease Maternal Grandfather    Kidney disease Paternal Grandmother    Heart disease Paternal Grandfather    Heart disease Other    Colon cancer Neg Hx    Colon polyps Neg Hx    Esophageal cancer Neg Hx    Rectal cancer Neg Hx    Stomach cancer Neg Hx      Current Outpatient Medications:    anastrozole (ARIMIDEX) 1 MG tablet, Take 1 tablet (1 mg total) by mouth daily., Disp: 90 tablet, Rfl: 3   Calcium Carbonate-Vit D-Min (CALCIUM 1200 PO), Take by mouth., Disp: , Rfl:    cholecalciferol (VITAMIN D3) 25 MCG (1000 UNIT) tablet, Take 1,000 Units by mouth daily., Disp: , Rfl:    fluticasone 0.05%-ketoconazole 2% 1:1 cream mixture, apply TO THE AFFECTED AREA TWICE DAILY AS NEEDED, Disp: , Rfl:    Multiple Vitamin (MULTIVITAMIN) tablet, Take 1 tablet by mouth daily. , Disp: , Rfl:    Polyethyl Glycol-Propyl Glycol (SYSTANE FREE OP), Apply to eye at bedtime as needed., Disp: , Rfl:   Current Facility-Administered Medications:    cyanocobalamin ((VITAMIN B-12)) injection 1,000 mcg, 1,000 mcg, Intramuscular, Q30 days,  Isaac Bliss, Rayford Halsted, MD, 1,000 mcg at 11/26/21 1028  Review of Systems:  Constitutional: Denies fever, chills, diaphoresis, appetite change and fatigue.  HEENT: Denies photophobia, eye pain, redness, hearing loss, ear pain, congestion, sore throat, rhinorrhea, sneezing, mouth sores, trouble swallowing, neck pain, neck stiffness and tinnitus.   Respiratory: Denies SOB, DOE, cough, chest tightness,  and wheezing.   Cardiovascular: Denies chest pain, palpitations and leg swelling.  Gastrointestinal: Denies nausea, vomiting, abdominal pain, diarrhea, constipation, blood in stool and abdominal distention.  Genitourinary: Denies dysuria, urgency, frequency, hematuria, flank pain and difficulty urinating.   Endocrine: Denies: hot or cold intolerance, sweats, changes in hair or nails, polyuria, polydipsia. Musculoskeletal: Denies myalgias, back pain, joint swelling, arthralgias and gait problem.  Skin: Denies pallor, rash and wound.  Neurological: Denies dizziness, seizures, syncope, weakness, light-headedness, numbness and headaches.  Hematological: Denies adenopathy. Easy bruising, personal or family bleeding history  Psychiatric/Behavioral: Denies suicidal ideation, mood changes, confusion, nervousness, sleep disturbance and agitation    Physical Exam: Vitals:   08/11/22 1021  BP: 110/70  Pulse: 86  Temp: 98.2 F (36.8 C)  TempSrc: Oral  SpO2: 99%  Weight: 173 lb 3.2 oz (78.6 kg)  Height: 5' 4.5" (1.638 m)    Body mass index is 29.27 kg/m.   Constitutional: NAD, calm, comfortable Eyes: PERRL, lids and conjunctivae normal ENMT: Mucous membranes are moist. Posterior pharynx clear of any exudate or lesions. Normal dentition. Tympanic membrane is pearly white, no erythema or bulging. Neck: normal, supple, no masses, no thyromegaly Respiratory: clear to auscultation bilaterally, no wheezing, no crackles. Normal respiratory effort. No accessory muscle use.  Cardiovascular: Regular rate and rhythm, no murmurs / rubs / gallops. No extremity edema. 2+ pedal pulses. No carotid bruits.  Abdomen: no tenderness, no masses palpated. No hepatosplenomegaly. Bowel sounds positive.  Musculoskeletal: no clubbing / cyanosis. No joint deformity upper and lower extremities. Good ROM, no contractures. Normal muscle tone.  Skin: no rashes, lesions, ulcers. No induration Neurologic: CN 2-12 grossly intact. Sensation intact, DTR normal. Strength 5/5 in all 4.  Psychiatric: Normal judgment and insight. Alert and oriented x 3. Normal mood.    Initial Medicare wellness visit   1. Risk factors, based on past  M,S,F -cardiovascular disease risk factors include age only   2.  Physical activities: Remains  active with activities of daily living   3.  Depression/mood: Stable, not depressed   4.  Hearing: No perceived issues   5.  ADL's: Independent in all ADLs   6.  Fall risk: Low fall risk   7.  Home safety: No problems identified   8.  Height weight, and visual acuity: height and weight as above, vision:  Vision Screening   Right eye Left eye Both eyes  Without correction '20/25 20/25 20/25 '$  With correction        9.  Counseling: Advised to update all age-appropriate immunizations   10. Lab orders based on risk factors: Laboratory update will be reviewed   11. Referral : None today   12. Care plan: Follow-up with me in 1 year   13. Cognitive assessment: No cognitive impairment   14. Screening: Patient provided with a written and personalized 5-10 year screening schedule in the AVS. yes   15. Provider List Update: PCP only  16. Advance Directives: Full code   17. Opioids: [name] not on any opioid prescriptions and has no risk factors for a substance use disorder.    Thomas Office Visit from  08/11/2022 in [facility] [place]  PHQ-9 Total Score 0          08/18/2020    9:21 AM 08/24/2021    7:54 AM 12/23/2021   10:01 AM 07/14/2022   10:00 AM 08/11/2022   10:26 AM  Fall Risk  Falls in the past year? 0 0 0  0  Was there an injury with Fall?  0 0  0  Fall Risk Category Calculator  0 0  0  Fall Risk Category  Low Low  Low  Patient Fall Risk Level   Low fall risk Low fall risk Low fall risk  Patient at Risk for Falls Due to   No Fall Risks  No Fall Risks  Fall risk Follow up   Falls evaluation completed  Falls evaluation completed     Impression and Plan:  Encounter for preventive health examination  IGT (impaired glucose tolerance) - Plan: Hemoglobin A1c  Vitamin D deficiency - Plan: VITAMIN D 25 Hydroxy (Vit-D Deficiency, Fractures)  Vitamin B12 deficiency - Plan: Vitamin B12  Malignant neoplasm of upper-inner quadrant of right  breast in female, estrogen receptor positive ([facility]) - Plan: CBC with Differential/Platelet, Comprehensive metabolic panel, Lipid panel  Need for vaccination for Strep pneumoniae   -Recommend routine eye and dental care. -Immunizations: PCV 20 in office to today, all other immunizations are up-to-date -Healthy lifestyle discussed in detail. -Labs to be updated today. -Colon cancer screening: 04/2021 -Breast cancer screening: 10/2021 -Cervical cancer screening: 08/2021 -Lung cancer screening: Not applicable -Prostate cancer screening: Not applicable -DEXA: 03/6814     Lelon Frohlich, MD [facility]

## 2022-08-15 LAB — POCT URINALYSIS DIPSTICK
Bilirubin, UA: NEGATIVE
Blood, UA: NEGATIVE
Glucose, UA: NEGATIVE
Ketones, UA: NEGATIVE
Leukocytes, UA: NEGATIVE
Nitrite, UA: NEGATIVE
Protein, UA: NEGATIVE
Spec Grav, UA: 1.015 (ref 1.010–1.025)
Urobilinogen, UA: 0.2 E.U./dL
pH, UA: 6 (ref 5.0–8.0)

## 2022-08-15 NOTE — Telephone Encounter (Signed)
Patient will stop by today and give an urine sample.

## 2022-08-24 DIAGNOSIS — N952 Postmenopausal atrophic vaginitis: Secondary | ICD-10-CM | POA: Diagnosis not present

## 2022-08-24 DIAGNOSIS — Z01419 Encounter for gynecological examination (general) (routine) without abnormal findings: Secondary | ICD-10-CM | POA: Diagnosis not present

## 2022-08-24 DIAGNOSIS — N8111 Cystocele, midline: Secondary | ICD-10-CM | POA: Diagnosis not present

## 2022-08-24 DIAGNOSIS — N882 Stricture and stenosis of cervix uteri: Secondary | ICD-10-CM | POA: Diagnosis not present

## 2022-08-24 DIAGNOSIS — Z853 Personal history of malignant neoplasm of breast: Secondary | ICD-10-CM | POA: Diagnosis not present

## 2022-08-24 DIAGNOSIS — M8580 Other specified disorders of bone density and structure, unspecified site: Secondary | ICD-10-CM | POA: Diagnosis not present

## 2022-09-09 DIAGNOSIS — L820 Inflamed seborrheic keratosis: Secondary | ICD-10-CM | POA: Diagnosis not present

## 2022-09-09 DIAGNOSIS — D225 Melanocytic nevi of trunk: Secondary | ICD-10-CM | POA: Diagnosis not present

## 2022-09-09 DIAGNOSIS — B078 Other viral warts: Secondary | ICD-10-CM | POA: Diagnosis not present

## 2022-09-09 DIAGNOSIS — L304 Erythema intertrigo: Secondary | ICD-10-CM | POA: Diagnosis not present

## 2022-09-09 DIAGNOSIS — Z1283 Encounter for screening for malignant neoplasm of skin: Secondary | ICD-10-CM | POA: Diagnosis not present

## 2022-10-13 DIAGNOSIS — Z1231 Encounter for screening mammogram for malignant neoplasm of breast: Secondary | ICD-10-CM | POA: Diagnosis not present

## 2022-10-14 ENCOUNTER — Encounter: Payer: Self-pay | Admitting: Hematology and Oncology

## 2022-10-17 DIAGNOSIS — R69 Illness, unspecified: Secondary | ICD-10-CM | POA: Diagnosis not present

## 2022-10-19 DIAGNOSIS — C50211 Malignant neoplasm of upper-inner quadrant of right female breast: Secondary | ICD-10-CM | POA: Diagnosis not present

## 2022-10-19 DIAGNOSIS — Z170 Estrogen receptor positive status [ER+]: Secondary | ICD-10-CM | POA: Diagnosis not present

## 2022-11-01 DIAGNOSIS — R69 Illness, unspecified: Secondary | ICD-10-CM | POA: Diagnosis not present

## 2022-11-02 ENCOUNTER — Telehealth: Payer: Self-pay | Admitting: Radiology

## 2022-11-02 DIAGNOSIS — C50211 Malignant neoplasm of upper-inner quadrant of right female breast: Secondary | ICD-10-CM

## 2022-11-02 NOTE — Telephone Encounter (Signed)
WF [id number] Understanding and Predicting Breast Cancer Events after Treatment (UPBEAT)   11/02/22  4 YR PHONE CALL: Confirmed I was speaking with Judith Thomas . Informed patient reason for call is to complete the 4 yr follow-up phone call for the above mentioned study. Judith Thomas is doing well and has not cardiac related hospitalizations or events in the past year. Patient has no limitations. Patient was thanked for her time and participation in the above mentioned study. Informed patient next follow-up will be in one year. Patient expressed understanding.   Carol Ada, RT(R)(T) Clinical Research Coordinator

## 2022-11-08 ENCOUNTER — Encounter: Payer: Self-pay | Admitting: Hematology and Oncology

## 2022-11-08 ENCOUNTER — Ambulatory Visit: Payer: Medicare HMO | Admitting: Plastic Surgery

## 2022-11-08 ENCOUNTER — Encounter: Payer: Self-pay | Admitting: Plastic Surgery

## 2022-11-08 VITALS — BP 143/85 | HR 72 | Ht 65.0 in | Wt 176.0 lb

## 2022-11-08 DIAGNOSIS — Z923 Personal history of irradiation: Secondary | ICD-10-CM | POA: Diagnosis not present

## 2022-11-08 DIAGNOSIS — M545 Low back pain, unspecified: Secondary | ICD-10-CM

## 2022-11-08 DIAGNOSIS — M542 Cervicalgia: Secondary | ICD-10-CM | POA: Diagnosis not present

## 2022-11-08 DIAGNOSIS — Z170 Estrogen receptor positive status [ER+]: Secondary | ICD-10-CM

## 2022-11-08 DIAGNOSIS — C50211 Malignant neoplasm of upper-inner quadrant of right female breast: Secondary | ICD-10-CM | POA: Diagnosis not present

## 2022-11-08 DIAGNOSIS — M546 Pain in thoracic spine: Secondary | ICD-10-CM | POA: Diagnosis not present

## 2022-11-08 DIAGNOSIS — Z6829 Body mass index (BMI) 29.0-29.9, adult: Secondary | ICD-10-CM

## 2022-11-08 DIAGNOSIS — N62 Hypertrophy of breast: Secondary | ICD-10-CM | POA: Diagnosis not present

## 2022-11-08 DIAGNOSIS — N6489 Other specified disorders of breast: Secondary | ICD-10-CM | POA: Insufficient documentation

## 2022-11-08 NOTE — Progress Notes (Signed)
Patient ID: Judith Thomas, female    DOB: 11/09/1955, 67 y.o.   MRN: [id number]   Chief Complaint  Patient presents with   Consult   Breast Problem   Breast Cancer    The patient is a 67 y.o. female with a history of right breast cancer.  She was treated with a partial mastectomy and radiation that ended in 2020.  She has had extremely large breasts causing symptoms for the years that include the following: Back pain in the upper and lower back, including neck pain. She pulls or pins her bra straps to provide better lift and relief of the pressure and pain. She notices relief by holding her breast up manually.  Her shoulder straps cause grooves and pain and pressure that requires padding for relief. Pain medication is sometimes required with motrin and tylenol.  Activities that are hindered by enlarged breasts include: exercise and running.  She has tried supportive clothing as well as fitted bras without improvement.  Her breast surgery was done by Dr. [name].  Her breasts are extremely large but not symmetric due to the right sided breast cancer.  Her right breast is 1 to 2 cup sizes smaller than her left breast.  The sternal to nipple distance on the right is 25 cm and the left is 29 cm.  The IMF distance is 24 cm.  She is 5 feet 5 inches tall and weighs 176 pounds.  The BMI = 29.3 kg/m.  Preoperative bra size = on the left she is a DD cup and probably a C cup on the right.  Mammogram history: January 2024 and it was negative.  Tobacco use: None and no history of diabetes.   The patient expresses the desire to pursue surgical intervention.     Review of Systems  Constitutional: Negative.   HENT: Negative.    Eyes: Negative.   Respiratory: Negative.    Cardiovascular: Negative.   Gastrointestinal: Negative.   Endocrine: Negative.   Genitourinary: Negative.   Musculoskeletal: Negative.     Past Medical History:  Diagnosis Date   Breast cancer ([facility])    right   Family history of  bladder cancer    Family history of breast cancer    Knee pain, chronic 10/11/2015    Past Surgical History:  Procedure Laterality Date   BREAST LUMPECTOMY WITH RADIOACTIVE SEED AND SENTINEL LYMPH NODE BIOPSY Right 03/08/2019   Procedure: RIGHT BREAST RADIOACTIVE SEED LUMPECTOMY X2 AND SENTINEL LYMPH NODE BIOPSY;  Surgeon: Jovita Kussmaul, MD;  Location: [facility];  Service: General;  Laterality: Right;   COLONOSCOPY  03/2010   none     PORT-A-CATH REMOVAL Left 12/30/2019   Procedure: REMOVAL PORT-A-CATH;  Surgeon: Jovita Kussmaul, MD;  Location: [facility];  Service: General;  Laterality: Left;   PORTACATH PLACEMENT Left 11/07/2018   Procedure: INSERTION PORT-A-CATH WITH ULTRASOUND;  Surgeon: Jovita Kussmaul, MD;  Location: [facility];  Service: General;  Laterality: Left;      Current Outpatient Medications:    anastrozole (ARIMIDEX) 1 MG tablet, Take 1 tablet (1 mg total) by mouth daily., Disp: 90 tablet, Rfl: 3   Calcium Carbonate-Vit D-Min (CALCIUM 1200 PO), Take by mouth., Disp: , Rfl:    cholecalciferol (VITAMIN D3) 25 MCG (1000 UNIT) tablet, Take 1,000 Units by mouth daily., Disp: , Rfl:    fluticasone 0.05%-ketoconazole 2% 1:1 cream mixture, apply TO THE AFFECTED AREA TWICE DAILY AS NEEDED,  Disp: , Rfl:    Multiple Vitamin (MULTIVITAMIN) tablet, Take 1 tablet by mouth daily. , Disp: , Rfl:    Polyethyl Glycol-Propyl Glycol (SYSTANE FREE OP), Apply to eye at bedtime as needed., Disp: , Rfl:    Objective:   Vitals:   11/08/22 0929  BP: (!) 143/85  Pulse: 72  SpO2: 97%    Physical Exam Vitals and nursing note reviewed.  Constitutional:      Appearance: Normal appearance.  HENT:     Head: Normocephalic and atraumatic.  Cardiovascular:     Rate and Rhythm: Normal rate.     Pulses: Normal pulses.  Pulmonary:     Effort: Pulmonary effort is normal.  Abdominal:     General: There is no distension.     Palpations: Abdomen is soft.      Tenderness: There is no abdominal tenderness.  Musculoskeletal:        General: No swelling or deformity.  Skin:    General: Skin is warm.     Capillary Refill: Capillary refill takes less than 2 seconds.  Neurological:     Mental Status: She is alert and oriented to person, place, and time.  Psychiatric:        Mood and Affect: Mood normal.        Behavior: Behavior normal.        Thought Content: Thought content normal.     Assessment & Plan:  Malignant neoplasm of upper-inner quadrant of right breast in female, estrogen receptor positive ([facility])  Postoperative breast asymmetry  Symptomatic mammary hypertrophy  The procedure the patient selected and that was best for the patient was discussed. The risk were discussed and include but not limited to the following:  Breast asymmetry, fluid accumulation, firmness of the breast, inability to breast feed, loss of nipple or areola, skin loss, change in skin and nipple sensation, fat necrosis of the breast tissue, bleeding, infection and healing delay.  There are risks of anesthesia and injury to nerves or blood vessels.  Allergic reaction to tape, suture and skin glue are possible.  There will be swelling.  Any of these can lead to the need for revisional surgery.  A breast reduction has potential to interfere with diagnostic procedures in the future.  This procedure is best done when the breast is fully developed.  Changes in the breast will continue to occur over time: pregnancy, weight gain or weigh loss.    Total time: 40 minutes. This includes time spent with the patient during the visit as well as time spent before and after the visit reviewing the chart, documenting the encounter, ordering pertinent studies and literature for the patient.    The patient is in agreement to not operate on the right breast due to the radiation history.  She would like to be more even.  I think this is extremely reasonable.  I recommend a left breast  reduction for symmetry.  Pictures were obtained of the patient and placed in the chart with the patient's or guardian's permission.   [place], DO

## 2022-11-09 ENCOUNTER — Encounter (INDEPENDENT_AMBULATORY_CARE_PROVIDER_SITE_OTHER): Payer: Commercial Managed Care - PPO | Admitting: Family

## 2022-11-09 ENCOUNTER — Encounter (INDEPENDENT_AMBULATORY_CARE_PROVIDER_SITE_OTHER): Payer: Self-pay | Admitting: Family

## 2022-11-22 ENCOUNTER — Encounter: Payer: Self-pay | Admitting: Plastic Surgery

## 2022-11-25 ENCOUNTER — Telehealth: Payer: Self-pay | Admitting: Surgical

## 2022-11-25 NOTE — Telephone Encounter (Signed)
PA requested a virtual visit to be set up with either himself of another PA and lvm for pt to return call to schedule.

## 2022-11-25 NOTE — Telephone Encounter (Signed)
Please schedule patient for appt next week with Merry Proud or Myself (virtual)

## 2022-11-29 ENCOUNTER — Telehealth (INDEPENDENT_AMBULATORY_CARE_PROVIDER_SITE_OTHER): Payer: Medicare HMO | Admitting: Surgical

## 2022-11-29 DIAGNOSIS — N62 Hypertrophy of breast: Secondary | ICD-10-CM

## 2022-11-29 DIAGNOSIS — Z170 Estrogen receptor positive status [ER+]: Secondary | ICD-10-CM

## 2022-11-29 DIAGNOSIS — N6489 Other specified disorders of breast: Secondary | ICD-10-CM

## 2022-11-29 DIAGNOSIS — C50211 Malignant neoplasm of upper-inner quadrant of right female breast: Secondary | ICD-10-CM

## 2022-11-29 NOTE — Progress Notes (Signed)
   Referring Provider Isaac Bliss, Rayford Halsted, MD [place],  [place] [address]   CC: No chief complaint on file.     Nakira Ewald is an 67 y.o. female.  HPI: [name] a 67 year old female who presents via video visit to discuss questions about left breast reduction surgery.  She had a consultation with Dr. Marla Roe on 11/08/2022, has some additional questions after the consultation that she would like answered.  The patient gave consent to have this visit done by telemedicine / virtual visit, two identifiers were used to identify patient. This is also consent for access the chart and treat the patient via this visit. The patient is located at home in [place].  I, the provider, am at the office.  We spent 8 minutes together for the visit.  Joined by video visit.   Physical Exam    11/08/2022    9:29 AM 08/11/2022   10:21 AM 07/14/2022   10:23 AM  Vitals with BMI  Height 5' 5"$  5' 4.5" 5' 5"$   Weight 176 lbs 173 lbs 3 oz 176 lbs 11 oz  BMI 29.29 [id number]  Systolic [id number]  Diastolic 85 70 79  Pulse 72 86 76    General:  No acute distress,  Alert and oriented, Non-Toxic, Normal speech and affect  Assessment/Plan 67 year old female presents via video visit to discuss questions about breast reduction surgery.  We discussed the expected incisions, these were drawn out for her on paper via video.  We also discussed the risks associated with breast reduction.  We discussed the process and technique of a breast reduction surgery.  Patient was appreciative and understanding of all things that were discussed.  I encouraged her to ask any additional questions that she had any.  We will plan to see her at her preop appointment once surgery is scheduled  Carola Rhine Hatsue Sime 11/29/2022, 2:00 PM

## 2022-12-14 ENCOUNTER — Telehealth: Payer: Self-pay | Admitting: *Deleted

## 2022-12-14 ENCOUNTER — Encounter: Payer: Self-pay | Admitting: Hematology and Oncology

## 2022-12-14 NOTE — Telephone Encounter (Signed)
LVM to schedule surgery 

## 2022-12-19 ENCOUNTER — Telehealth: Payer: Self-pay | Admitting: *Deleted

## 2022-12-19 NOTE — Telephone Encounter (Signed)
LVM to schedule surgery

## 2023-01-13 ENCOUNTER — Encounter: Payer: Self-pay | Admitting: Physician Assistant

## 2023-01-13 ENCOUNTER — Ambulatory Visit (INDEPENDENT_AMBULATORY_CARE_PROVIDER_SITE_OTHER): Payer: Medicare HMO | Admitting: Physician Assistant

## 2023-01-13 VITALS — BP 158/86 | HR 85 | Ht 65.0 in | Wt 179.0 lb

## 2023-01-13 DIAGNOSIS — C50211 Malignant neoplasm of upper-inner quadrant of right female breast: Secondary | ICD-10-CM

## 2023-01-13 DIAGNOSIS — Z170 Estrogen receptor positive status [ER+]: Secondary | ICD-10-CM

## 2023-01-13 DIAGNOSIS — N6489 Other specified disorders of breast: Secondary | ICD-10-CM

## 2023-01-13 MED ORDER — OXYCODONE HCL 5 MG PO TABS: 5.0000 mg | ORAL_TABLET | Freq: Four times a day (QID) | ORAL | 0 refills | Status: AC | PRN

## 2023-01-13 MED ORDER — CEPHALEXIN 500 MG PO CAPS: 500.0000 mg | ORAL_CAPSULE | Freq: Four times a day (QID) | ORAL | 0 refills | Status: AC

## 2023-01-13 MED ORDER — ONDANSETRON 4 MG PO TBDP: 4.0000 mg | ORAL_TABLET | Freq: Three times a day (TID) | ORAL | 0 refills | Status: DC | PRN

## 2023-01-13 NOTE — Progress Notes (Signed)
Patient ID: Judith Thomas, female    DOB: 09-26-1956, 67 y.o.   MRN: [id number]  Chief Complaint  Patient presents with   Pre-op Exam      ICD-10-CM   1. Malignant neoplasm of upper-inner quadrant of right breast in female, estrogen receptor positive  C50.211    Z17.0     2. Postoperative breast asymmetry  N64.89        History of Present Illness: Judith Thomas is a 67 y.o.  female  with a history of right-sided breast cancer s/p partial mastectomy and radiation.  She presents for preoperative evaluation for upcoming procedure, left-sided breast reduction for symmetry, scheduled for 02/09/2023 with Dr. Ulice Bold.  The patient has not had problems with anesthesia.  She does not smoke or use nicotine-containing products.  She denies any personal or family history of blood clots or clotting disorder.  She denies any significant cardiac or pulmonary history.  She will hold her vitamins and supplements 1 week prior to surgery, but may continue to take her anastrozole as prescribed.  She does endorse a couple varicosities in addition to spider veins.  She confirms that she wants her left side to simply match the size of her right breast.  She understands that I will be difficult to achieve perfect symmetry given that the right side has history of radiation.  Summary of Previous Visit: She was seen by Dr. Ulice Bold 11/08/2022.  At that time, reported asymmetry due to right-sided breast cancer s/p partial mastectomy and radiation.  STN on the left is 29 cm.  Discussed unilateral reduction for symmetry.  No plans for operating on the right breast given her history of radiation.  PMH Significant for: Right-sided breast cancer s/p partial mastectomy and radiation, postoperative breast asymmetry, vitamin deficiencies.   Past Medical History: Allergies: Allergies  Allergen Reactions   Paclitaxel Other (See Comments)    Nausea, flushing    Current Medications:  Current Outpatient  Medications:    anastrozole (ARIMIDEX) 1 MG tablet, Take 1 tablet (1 mg total) by mouth daily., Disp: 90 tablet, Rfl: 3   Calcium Carbonate-Vit D-Min (CALCIUM 1200 PO), Take by mouth., Disp: , Rfl:    cholecalciferol (VITAMIN D3) 25 MCG (1000 UNIT) tablet, Take 1,000 Units by mouth daily., Disp: , Rfl:    fluticasone 0.05%-ketoconazole 2% 1:1 cream mixture, apply TO THE AFFECTED AREA TWICE DAILY AS NEEDED, Disp: , Rfl:    Multiple Vitamin (MULTIVITAMIN) tablet, Take 1 tablet by mouth daily. , Disp: , Rfl:    Polyethyl Glycol-Propyl Glycol (SYSTANE FREE OP), Apply to eye at bedtime as needed., Disp: , Rfl:   Past Medical Problems: Past Medical History:  Diagnosis Date   Breast cancer    right   Family history of bladder cancer    Family history of breast cancer    Knee pain, chronic 10/11/2015    Past Surgical History: Past Surgical History:  Procedure Laterality Date   BREAST LUMPECTOMY WITH RADIOACTIVE SEED AND SENTINEL LYMPH NODE BIOPSY Right 03/08/2019   Procedure: RIGHT BREAST RADIOACTIVE SEED LUMPECTOMY X2 AND SENTINEL LYMPH NODE BIOPSY;  Surgeon: Griselda Miner, MD;  Location: [facility];  Service: General;  Laterality: Right;   COLONOSCOPY  03/2010   none     PORT-A-CATH REMOVAL Left 12/30/2019   Procedure: REMOVAL PORT-A-CATH;  Surgeon: Griselda Miner, MD;  Location: [facility];  Service: General;  Laterality: Left;   PORTACATH PLACEMENT Left 11/07/2018  Procedure: INSERTION PORT-A-CATH WITH ULTRASOUND;  Surgeon: Griselda Mineroth, Paul III, MD;  Location: [facility];  Service: General;  Laterality: Left;    Social History: Social History   Socioeconomic History   Marital status: Married    Spouse name: michael   Number of children: Not on file   Years of education: Not on file   Highest education level: Some college, no degree  Occupational History   Not on file  Tobacco Use   Smoking status: Never   Smokeless tobacco: Never  Vaping  Use   Vaping Use: Never used  Substance and Sexual Activity   Alcohol use: Yes    Comment: rare use   Drug use: No   Sexual activity: Not on file  Other Topics Concern   Not on file  Social History Narrative   Not on file   Social Determinants of Health   Financial Resource Strain: Low Risk  (12/19/2021)   Overall Financial Resource Strain (CARDIA)    Difficulty of Paying Living Expenses: Not hard at all  Food Insecurity: No Food Insecurity (12/19/2021)   Hunger Vital Sign    Worried About Running Out of Food in the Last Year: Never true    Ran Out of Food in the Last Year: Never true  Transportation Needs: No Transportation Needs (12/19/2021)   PRAPARE - Administrator, Civil ServiceTransportation    Lack of Transportation (Medical): No    Lack of Transportation (Non-Medical): No  Physical Activity: Sufficiently Active (12/19/2021)   Exercise Vital Sign    Days of Exercise per Week: 3 days    Minutes of Exercise per Session: 50 min  Stress: No Stress Concern Present (12/19/2021)   [employer] - Occupational Stress Questionnaire    Feeling of Stress : Not at all  Social Connections: Socially Integrated (12/19/2021)   Social Connection and Isolation Panel [NHANES]    Frequency of Communication with Friends and Family: More than three times a week    Frequency of Social Gatherings with Friends and Family: More than three times a week    Attends Religious Services: 1 to 4 times per year    Active Member of [employer] Financial[employer]ubs or Organizations: Yes    Attends Engineer, structuralClub or Organization Meetings: More than 4 times per year    Marital Status: Married  Catering managerntimate Partner Violence: Not At Risk (10/26/2018)   Humiliation, Afraid, Rape, and Kick questionnaire    Fear of Current or Ex-Partner: No    Emotionally Abused: No    Physically Abused: No    Sexually Abused: No    Family History: Family History  Problem Relation Age of Onset   Liver disease Mother    Heart disease Mother        afib   Heart  disease Father    Asthma Father    Diabetes Father    Bladder Cancer Father        smoker   Breast cancer Maternal Aunt    Breast cancer Maternal Aunt        mets to bone   Heart disease Maternal Grandfather    Kidney disease Paternal Grandmother    Heart disease Paternal Grandfather    Heart disease Other    Colon cancer Neg Hx    Colon polyps Neg Hx    Esophageal cancer Neg Hx    Rectal cancer Neg Hx    Stomach cancer Neg Hx     Review of Systems: ROS Denies any recent chest pain, Diffley  breathing, leg swelling, or fevers.  Physical Exam: Vital Signs BP (!) 158/86 (BP Location: Left Arm, Patient Position: Sitting, Cuff Size: Large)   Pulse 85   Ht 5\' 5"  (1.651 m)   Wt 179 lb (81.2 kg)   SpO2 98%   BMI 29.79 kg/m   Physical Exam  Constitutional:      General: Not in acute distress.    Appearance: Normal appearance. Not ill-appearing.  HENT:     Head: Normocephalic and atraumatic.  Eyes:     Pupils: Pupils are equal, round. Cardiovascular:     Rate and Rhythm: Normal rate.    Pulses: Normal pulses.  Pulmonary:     Effort: No respiratory distress or increased work of breathing.  Speaks in full sentences. Abdominal:     General: Abdomen is flat. No distension.   Musculoskeletal: Normal range of motion. No lower extremity swelling or edema.  Scattered spider veins, possible varicosity on right thigh. Skin:    General: Skin is warm and dry.     Findings: No erythema or rash.  Neurological:     Mental Status: Alert and oriented to person, place, and time.  Psychiatric:        Mood and Affect: Mood normal.        Behavior: Behavior normal.    Assessment/Plan: The patient is scheduled for left-sided breast reduction for symmetry with Dr. Ulice Bold.  Risks, benefits, and alternatives of procedure discussed, questions answered and consent obtained.    Smoking Status: Non-smoker. Last Mammogram: 10/2022; Results: BI-RADS 2: Benign.  Caprini Score: 8; Risk  Factors include: Age, BMI greater than 25, varicosities, right-sided breast cancer, and length of planned surgery. Recommendation for mechanical and possibly pharmacological prophylaxis.  Will discuss with surgeon possibility of prescribing postoperative Lovenox.  At the very least, will encourage early ambulation.  Pictures obtained: 11/08/2022  Post-op Rx sent to pharmacy: Keflex, Zofran, oxycodone.  Patient was provided with the General Surgical Risk consent document and Pain Medication Agreement prior to their appointment.  They had adequate time to read through the risk consent documents and Pain Medication Agreement. We also discussed them in person together during this preop appointment. All of their questions were answered to their satisfaction.  Recommended calling if they have any further questions.  Risk consent form and Pain Medication Agreement to be scanned into patient's chart.  The risk that can be encountered with breast reduction were discussed and include the following but not limited to these:  Breast asymmetry, fluid accumulation, firmness of the breast, inability to breast feed, loss of nipple or areola, skin loss, decrease or no nipple sensation, fat necrosis of the breast tissue, bleeding, infection, healing delay.  There are risks of anesthesia, changes to skin sensation and injury to nerves or blood vessels.  The muscle can be temporarily or permanently injured.  You may have an allergic reaction to tape, suture, glue, blood products which can result in skin discoloration, swelling, pain, skin lesions, poor healing.  Any of these can lead to the need for revisonal surgery or stage procedures.  A reduction has potential to interfere with diagnostic procedures.  Nipple or breast piercing can increase risks of infection.  This procedure is best done when the breast is fully developed.  Changes in the breast will continue to occur over time.  Pregnancy can alter the outcomes of previous  breast reduction surgery, weight gain and weigh loss can also effect the long term appearance.     Electronically signed  by: Evelena Leyden, PA-C 01/13/2023 11:54 AM

## 2023-01-13 NOTE — H&P (View-Only) (Signed)
   Patient ID: Judith Thomas, female    DOB: 10/26/1955, 66 y.o.   MRN: [id number]  Chief Complaint  Patient presents with   Pre-op Exam      ICD-10-CM   1. Malignant neoplasm of upper-inner quadrant of right breast in female, estrogen receptor positive  C50.211    Z17.0     2. Postoperative breast asymmetry  N64.89        History of Present Illness: Judith Thomas is a 66 y.o.  female  with a history of right-sided breast cancer s/p partial mastectomy and radiation.  She presents for preoperative evaluation for upcoming procedure, left-sided breast reduction for symmetry, scheduled for 02/09/2023 with Dr. Dillingham.  The patient has not had problems with anesthesia.  She does not smoke or use nicotine-containing products.  She denies any personal or family history of blood clots or clotting disorder.  She denies any significant cardiac or pulmonary history.  She will hold her vitamins and supplements 1 week prior to surgery, but may continue to take her anastrozole as prescribed.  She does endorse a couple varicosities in addition to spider veins.  She confirms that she wants her left side to simply match the size of her right breast.  She understands that I will be difficult to achieve perfect symmetry given that the right side has history of radiation.  Summary of Previous Visit: She was seen by Dr. Dillingham 11/08/2022.  At that time, reported asymmetry due to right-sided breast cancer s/p partial mastectomy and radiation.  STN on the left is 29 cm.  Discussed unilateral reduction for symmetry.  No plans for operating on the right breast given her history of radiation.  PMH Significant for: Right-sided breast cancer s/p partial mastectomy and radiation, postoperative breast asymmetry, vitamin deficiencies.   Past Medical History: Allergies: Allergies  Allergen Reactions   Paclitaxel Other (See Comments)    Nausea, flushing    Current Medications:  Current Outpatient  Medications:    anastrozole (ARIMIDEX) 1 MG tablet, Take 1 tablet (1 mg total) by mouth daily., Disp: 90 tablet, Rfl: 3   Calcium Carbonate-Vit D-Min (CALCIUM 1200 PO), Take by mouth., Disp: , Rfl:    cholecalciferol (VITAMIN D3) 25 MCG (1000 UNIT) tablet, Take 1,000 Units by mouth daily., Disp: , Rfl:    fluticasone 0.05%-ketoconazole 2% 1:1 cream mixture, apply TO THE AFFECTED AREA TWICE DAILY AS NEEDED, Disp: , Rfl:    Multiple Vitamin (MULTIVITAMIN) tablet, Take 1 tablet by mouth daily. , Disp: , Rfl:    Polyethyl Glycol-Propyl Glycol (SYSTANE FREE OP), Apply to eye at bedtime as needed., Disp: , Rfl:   Past Medical Problems: Past Medical History:  Diagnosis Date   Breast cancer    right   Family history of bladder cancer    Family history of breast cancer    Knee pain, chronic 10/11/2015    Past Surgical History: Past Surgical History:  Procedure Laterality Date   BREAST LUMPECTOMY WITH RADIOACTIVE SEED AND SENTINEL LYMPH NODE BIOPSY Right 03/08/2019   Procedure: RIGHT BREAST RADIOACTIVE SEED LUMPECTOMY X2 AND SENTINEL LYMPH NODE BIOPSY;  Surgeon: Toth, Paul III, MD;  Location: [facility];  Service: General;  Laterality: Right;   COLONOSCOPY  03/2010   none     PORT-A-CATH REMOVAL Left 12/30/2019   Procedure: REMOVAL PORT-A-CATH;  Surgeon: Toth, Paul III, MD;  Location: [facility];  Service: General;  Laterality: Left;   PORTACATH PLACEMENT Left 11/07/2018     Procedure: INSERTION PORT-A-CATH WITH ULTRASOUND;  Surgeon: Toth, Paul III, MD;  Location: [facility];  Service: General;  Laterality: Left;    Social History: Social History   Socioeconomic History   Marital status: Married    Spouse name: michael   Number of children: Not on file   Years of education: Not on file   Highest education level: Some college, no degree  Occupational History   Not on file  Tobacco Use   Smoking status: Never   Smokeless tobacco: Never  Vaping  Use   Vaping Use: Never used  Substance and Sexual Activity   Alcohol use: Yes    Comment: rare use   Drug use: No   Sexual activity: Not on file  Other Topics Concern   Not on file  Social History Narrative   Not on file   Social Determinants of Health   Financial Resource Strain: Low Risk  (12/19/2021)   Overall Financial Resource Strain (CARDIA)    Difficulty of Paying Living Expenses: Not hard at all  Food Insecurity: No Food Insecurity (12/19/2021)   Hunger Vital Sign    Worried About Running Out of Food in the Last Year: Never true    Ran Out of Food in the Last Year: Never true  Transportation Needs: No Transportation Needs (12/19/2021)   PRAPARE - Transportation    Lack of Transportation (Medical): No    Lack of Transportation (Non-Medical): No  Physical Activity: Sufficiently Active (12/19/2021)   Exercise Vital Sign    Days of Exercise per Week: 3 days    Minutes of Exercise per Session: 50 min  Stress: No Stress Concern Present (12/19/2021)   [employer] - Occupational Stress Questionnaire    Feeling of Stress : Not at all  Social Connections: Socially Integrated (12/19/2021)   Social Connection and Isolation Panel [NHANES]    Frequency of Communication with Friends and Family: More than three times a week    Frequency of Social Gatherings with Friends and Family: More than three times a week    Attends Religious Services: 1 to 4 times per year    Active Member of Clubs or Organizations: Yes    Attends Club or Organization Meetings: More than 4 times per year    Marital Status: Married  Intimate Partner Violence: Not At Risk (10/26/2018)   Humiliation, Afraid, Rape, and Kick questionnaire    Fear of Current or Ex-Partner: No    Emotionally Abused: No    Physically Abused: No    Sexually Abused: No    Family History: Family History  Problem Relation Age of Onset   Liver disease Mother    Heart disease Mother        afib   Heart  disease Father    Asthma Father    Diabetes Father    Bladder Cancer Father        smoker   Breast cancer Maternal Aunt    Breast cancer Maternal Aunt        mets to bone   Heart disease Maternal Grandfather    Kidney disease Paternal Grandmother    Heart disease Paternal Grandfather    Heart disease Other    Colon cancer Neg Hx    Colon polyps Neg Hx    Esophageal cancer Neg Hx    Rectal cancer Neg Hx    Stomach cancer Neg Hx     Review of Systems: ROS Denies any recent chest pain, Diffley   breathing, leg swelling, or fevers.  Physical Exam: Vital Signs BP (!) 158/86 (BP Location: Left Arm, Patient Position: Sitting, Cuff Size: Large)   Pulse 85   Ht 5' 5" (1.651 m)   Wt 179 lb (81.2 kg)   SpO2 98%   BMI 29.79 kg/m   Physical Exam  Constitutional:      General: Not in acute distress.    Appearance: Normal appearance. Not ill-appearing.  HENT:     Head: Normocephalic and atraumatic.  Eyes:     Pupils: Pupils are equal, round. Cardiovascular:     Rate and Rhythm: Normal rate.    Pulses: Normal pulses.  Pulmonary:     Effort: No respiratory distress or increased work of breathing.  Speaks in full sentences. Abdominal:     General: Abdomen is flat. No distension.   Musculoskeletal: Normal range of motion. No lower extremity swelling or edema.  Scattered spider veins, possible varicosity on right thigh. Skin:    General: Skin is warm and dry.     Findings: No erythema or rash.  Neurological:     Mental Status: Alert and oriented to person, place, and time.  Psychiatric:        Mood and Affect: Mood normal.        Behavior: Behavior normal.    Assessment/Plan: The patient is scheduled for left-sided breast reduction for symmetry with Dr. Dillingham.  Risks, benefits, and alternatives of procedure discussed, questions answered and consent obtained.    Smoking Status: Non-smoker. Last Mammogram: 10/2022; Results: BI-RADS 2: Benign.  Caprini Score: 8; Risk  Factors include: Age, BMI greater than 25, varicosities, right-sided breast cancer, and length of planned surgery. Recommendation for mechanical and possibly pharmacological prophylaxis.  Will discuss with surgeon possibility of prescribing postoperative Lovenox.  At the very least, will encourage early ambulation.  Pictures obtained: 11/08/2022  Post-op Rx sent to pharmacy: Keflex, Zofran, oxycodone.  Patient was provided with the General Surgical Risk consent document and Pain Medication Agreement prior to their appointment.  They had adequate time to read through the risk consent documents and Pain Medication Agreement. We also discussed them in person together during this preop appointment. All of their questions were answered to their satisfaction.  Recommended calling if they have any further questions.  Risk consent form and Pain Medication Agreement to be scanned into patient's chart.  The risk that can be encountered with breast reduction were discussed and include the following but not limited to these:  Breast asymmetry, fluid accumulation, firmness of the breast, inability to breast feed, loss of nipple or areola, skin loss, decrease or no nipple sensation, fat necrosis of the breast tissue, bleeding, infection, healing delay.  There are risks of anesthesia, changes to skin sensation and injury to nerves or blood vessels.  The muscle can be temporarily or permanently injured.  You may have an allergic reaction to tape, suture, glue, blood products which can result in skin discoloration, swelling, pain, skin lesions, poor healing.  Any of these can lead to the need for revisonal surgery or stage procedures.  A reduction has potential to interfere with diagnostic procedures.  Nipple or breast piercing can increase risks of infection.  This procedure is best done when the breast is fully developed.  Changes in the breast will continue to occur over time.  Pregnancy can alter the outcomes of previous  breast reduction surgery, weight gain and weigh loss can also effect the long term appearance.     Electronically signed   by: Matha Masse, PA-C 01/13/2023 11:54 AM 

## 2023-01-20 DIAGNOSIS — L820 Inflamed seborrheic keratosis: Secondary | ICD-10-CM | POA: Diagnosis not present

## 2023-01-25 ENCOUNTER — Other Ambulatory Visit: Payer: Self-pay

## 2023-01-25 ENCOUNTER — Ambulatory Visit (INDEPENDENT_AMBULATORY_CARE_PROVIDER_SITE_OTHER): Payer: Commercial Managed Care - PPO | Admitting: Family

## 2023-01-25 ENCOUNTER — Encounter (INDEPENDENT_AMBULATORY_CARE_PROVIDER_SITE_OTHER): Payer: Self-pay | Admitting: Family

## 2023-01-25 ENCOUNTER — Inpatient Hospital Stay (INDEPENDENT_AMBULATORY_CARE_PROVIDER_SITE_OTHER)
Admission: RE | Admit: 2023-01-25 | Discharge: 2023-01-25 | Disposition: A | Discharge status: Home / Self Care | Payer: Commercial Managed Care - PPO | Source: Ambulatory Visit

## 2023-01-25 VITALS — BP 176/84 | HR 58 | Temp 96.2°F | Resp 18 | Ht 61.0 in | Wt 145.0 lb

## 2023-01-25 DIAGNOSIS — E785 Hyperlipidemia, unspecified: Secondary | ICD-10-CM

## 2023-01-25 DIAGNOSIS — R002 Palpitations: Secondary | ICD-10-CM

## 2023-01-25 DIAGNOSIS — M79602 Pain in left arm: Secondary | ICD-10-CM

## 2023-01-25 DIAGNOSIS — R0609 Other forms of dyspnea: Secondary | ICD-10-CM

## 2023-01-25 DIAGNOSIS — I4519 Other right bundle-branch block: Secondary | ICD-10-CM

## 2023-01-25 DIAGNOSIS — R5383 Other fatigue: Secondary | ICD-10-CM

## 2023-01-25 DIAGNOSIS — F1290 Cannabis use, unspecified, uncomplicated: Secondary | ICD-10-CM

## 2023-01-25 DIAGNOSIS — F17200 Nicotine dependence, unspecified, uncomplicated: Secondary | ICD-10-CM

## 2023-01-25 DIAGNOSIS — T887XXA Unspecified adverse effect of drug or medicament, initial encounter: Secondary | ICD-10-CM

## 2023-01-25 DIAGNOSIS — I10 Essential (primary) hypertension: Secondary | ICD-10-CM

## 2023-01-25 DIAGNOSIS — R6884 Jaw pain: Secondary | ICD-10-CM

## 2023-01-25 DIAGNOSIS — Z955 Presence of coronary angioplasty implant and graft: Secondary | ICD-10-CM

## 2023-01-25 DIAGNOSIS — I2510 Atherosclerotic heart disease of native coronary artery without angina pectoris: Secondary | ICD-10-CM

## 2023-01-25 MED ORDER — LISINOPRIL 5 MG TABLET
5.0000 mg | ORAL_TABLET | Freq: Every day | ORAL | 1 refills | Status: DC
Start: 2023-01-25 — End: 2023-02-13

## 2023-01-25 NOTE — Progress Notes (Signed)
Cardiology [facility], [facility] [place]  [address]  [place] [address]  [phone]    Cardiology  Clinic Note    Name: [name]   DOB: 1955-11-25  [66 y.o. female]   MRN: [id number]       Visit Date: 01/25/2023   Referring: No referring provider defined for this encounter.   PCP: Lillia Mountain, PA-C         Chief Complaint: Annual Exam (Patient states that she has been having chest pain with minimal exertion. She stated that it feels like its behind her breast and the other day she could feel the pain up in her jaw.)      History of Present Illness   [name] is a 67 y.o. White female who presents for follow up. PMH includes HTN, COPD, HLD, and CAD s/p PCI.     LHC by Dr Kirk Ruths in 2019 with PCI to LAD with DES x3 (2.25 x 20, 2.5 x 20, and 2.5 x 15 Promus DES)     She was admitted to [facility] in Feb 2023 for evaluation of cough, body aches, SOB x7 days. Dr Kirk Ruths consulted re: chest pain and dyspnea. During hospitalization, she completed stress test showing no ischemia. Echo showed good EF with normal wall motion.      She was seen in cardiology clinic in April 2023, and reported doing well following discharge. She reported that Dr Griffith Citron stopped her aspirin and Plavix in 2020. We discussed importance of aspirin 81 mg daily, indefinitely.     She returns to clinic for follow-up and reports that last weekend she was cleaning her granddaughter's home and developed left arm ache as well as neck and jaw pain.  This lasted all day.  She denies any recurrence.  She notes associated shortness of breath with that episode.  She reports feeling fatigued.  She also notices episodes of palpitations that can occur throughout the day and last for several minutes.      Patient Active Problem List    Diagnosis Date Noted    CAD in native artery 01/24/2022    Essential hypertension 01/24/2022    Hyperlipidemia 01/24/2022    Tobacco use 01/24/2022    History  of coronary artery stent placement 01/24/2022       Allergies  No Known Allergies    Medications    Current Outpatient Medications:     albuterol sulfate (PROVENTIL OR VENTOLIN OR PROAIR) 90 mcg/actuation Inhalation oral inhaler, , Disp: , Rfl:     albuterol sulfate (PROVENTIL) 2.5 mg /3 mL (0.083 %) Inhalation nebulizer solution, , Disp: , Rfl:     aspirin (ECOTRIN) 81 mg Oral Tablet, Delayed Release (E.C.), Take 1 Tablet (81 mg total) by mouth Once a day, Disp: , Rfl:     atorvastatin (LIPITOR) 40 mg Oral Tablet, Take 1 Tablet (40 mg total) by mouth Once a day, Disp: , Rfl:     clonazePAM (KLONOPIN) 0.5 mg Oral Tablet, Take 1 Tablet (0.5 mg total) by mouth Once a day, Disp: , Rfl:     fluticasone propionate (FLONASE) 50 mcg/actuation Nasal Spray, Suspension, , Disp: , Rfl:     lisinopriL (PRINIVIL) 5 mg Oral Tablet, Take 1 Tablet (5 mg total) by mouth Once a day, Disp: 90 Tablet, Rfl: 1    metoprolol tartrate (LOPRESSOR) 25 mg Oral Tablet, Take 1 Tablet (25 mg total) by mouth Twice daily, Disp: , Rfl:  multivitamin with iron Oral Tablet, Take 1 Tablet by mouth Once a day, Disp: , Rfl:     sertraline (ZOLOFT) 100 mg Oral Tablet, Take 1 Tablet (100 mg total) by mouth Twice daily (Patient not taking: Reported on 01/25/2023), Disp: , Rfl:     traZODone (DESYREL) 150 mg Oral Tablet, Take 1 Tablet (150 mg total) by mouth Every night, Disp: , Rfl:     History  Past Medical History:   Diagnosis Date    Chronic lung disease     Coronary artery disease     Dyslipidemia     Essential hypertension     History of percutaneous coronary intervention     History of stress test     Unspecified chronic bronchitis (CMS HCC)          Past Surgical History:   Procedure Laterality Date    CARDIAC CATHETERIZATION      CORONARY ANGIOPLASTY      CORONARY ARTERY STENT PLACEMENT      HX BACK SURGERY      HX MASTECTOMY, SIMPLE Bilateral     HX TUBAL LIGATION      NECK SURGERY       Social History     Socioeconomic History    Marital  status: Widowed   Tobacco Use    Smoking status: Every Day     Current packs/day: 0.50     Types: Cigarettes    Smokeless tobacco: Never   Vaping Use    Vaping status: Never Used   Substance and Sexual Activity    Alcohol use: Yes     Comment: occasional    Drug use: Yes     Types: Marijuana     Family Medical History:       Problem Relation (Age of Onset)    Cancer Father    Heart Attack Father    Heart Disease Brother    Pacemaker Mother              Review of Systems:  General: Denies fever or chills. + fatigue.   CNS: Denies syncope or dizziness. No headache.  Eyes: Denies recent visual changes. No pain.  Ears, Nose, and Throat: Denies ear pain, nasal congestion, or sore throat.  Endocrine: Denies excessive hunger or thirst. No heat/cold intolerance.  Respiratory: + shortness of breath. No cough.  Cardiovascular: Per HPI Denies claudication or varicose veins.  GI: Denies nausea/vomiting. No black/bloody bowel movements.  GU: Denies hematuria or dysuria. No incontinence.  Musculoskeletal: Denies joint swelling or decreased ROM.  Skin: No rashes, hives, or eczema.  Psychiatric: Denies anxiety, depression, or insomnia.  Hematologic: No bleedings disorders or swollen nodes.    All other systems reviewed and either negative or not pertinent.     Physical Examination:  BP (!) 176/84 (Site: Right, Patient Position: Sitting, Cuff Size: Adult)   Pulse 58   Temp (!) 35.7 C (96.2 F) (Temporal)   Resp 18   Ht 1.549 m (5\' 1" )   Wt 65.8 kg (144 lb 15.2 oz)   SpO2 98%   BMI 27.39 kg/m       General: Awake. Well nourished. No acute distress. A&O x 3.  HEENT: Normocephalic, atraumatic. PERRL. Normal hearing.  Neck: No JVD or bruit. Trachea midline.  Lungs: Clear to auscultation. No rhonchi or wheezing noted. Unlabored.  Heart: Regular rate and rhythm. No murmurs, rubs, or gallops are appreciated.  Abdomen: BS normal x 4 quadrants. Soft, non-tender, non-distended.  Extremities: No  clubbing, cyanosis, or  edema.  Psychiatric: Cooperative, appropriate mood and affect.  Musculoskeletal: Strength grips are equal. No red, swollen joints. MAE.  Skin: Warm, dry, and intact. No rashes or hives are noted.       Diagnostics  ECG:  To be reviewed by Dr Kirk Ruths       Echo:  12/05/2021  Summary: Left ventricle: The cavity size is normal. Wall thickness is normal. Systolic function is normal. The estimated  ejection fraction is 5055%. Wall motion is normal? there are no regional wall motion abnormalities. Left ventricular diastolic  function parameters are normal.       Stress Test:  Feb 2023  CONCLUSION:  1. No evidence of ischemia or significant infarction was detected on this study. (This small linear mild decrease in perfusion at the RV insertion site in the mid and apical anterior segments is noted which appears be artifactual. It is not reversible. This area contracts and thickens normally suggesting no significant injury has occurred.)  2. Normal left ventricular chamber size and contractility with a 77% ejection fraction the gated study.  3. The right ventricle appeared in size and contractility.  4. The EKG portion of the study reportedly was negative for ischemia.  5. The patient had no chest symptoms with infusion.       Catherization:  LHC by Dr Kirk Ruths in Sept 2019  CORONARIES:The epicardial arteries appear to very calcified by fluoroscopy.  Right coronary: The right coronary artery is a dominant artery giving rise to PDA and posterolateral branch. The right coronary artery, PDA, and posterolateral branch have 20/30% diffuse disease.  Left main coronary: The left main has no radiographic demonstrable disease.  Left anterior descending coronary: The LAD is a Moderate caliber artery giving rise to Small diagonals. LAD Has a 60% narrowing Early mid followed by diffuse 40-30% diffuse disease followed with a 90% mid lesion and 50% distal lesion.  Circumflex coronary: The circumflex gives rise to 2 OMs. Circumflex proper, OM1,  and OM2 have 20-30% diffuse disease.  CONCLUSIONS:  1. Severe LAD disease with mild right coronary artery and circumflex disease  2. No Aortic stenosis on pullback gradient    Underwent PCI to LAD with 2.25 x 20 mm Promus DES, followed by 2.5 x 20 mm Promus DES deployed to the proximal lesion site.  Small dissection distal to the proximal stent. At that point the lesion was rewired without complications and a 2.5 x 15 mm stent was advanced to the dissection site and covered the distal portion of the proximal Portion of the distal stent.            Orders Placed This Encounter    BASIC METABOLIC PANEL    ECG - In Clinic (Non-[facility])    14 DAY (HOME ENROLLMENT) EXTENDED HOLTER MONITOR    TRANSTHORACIC ECHOCARDIOGRAM - ADULT    MYOCARDIAL PERFUSION COMPLETE    lisinopriL (PRINIVIL) 5 mg Oral Tablet       There are no discontinued medications.    Assessment and Plan:  Assessment/Plan   1. CAD in native artery    2. Essential hypertension    3. Dyslipidemia    4. History of heart artery stent    5. DOE (dyspnea on exertion)    6. Palpitations    7. Side effect of medication    8. Pain of left arm    9. Jaw pain    10. Fatigue        PLAN:  Patient with  known history PCI in 2019.  She reports she was cleaning her granddaughter's home last weekend when she noted left arm aching sensation, as well as neck and jaw pain.  She states this feels different than her previous symptoms prior to stenting.  She reports associated dyspnea with that episode.  Denies recurrence of symptoms.  She reports feeling fatigued.  She also reports palpitations that can last several minutes, and occur several times throughout the day.  Send prescription for nitroglycerin to pharmacy. Discussed ER evaluation for chest pain unrelieved by nitroglycerin.  Patient verbalized understanding.  Check monitor to assess heart rate/rhythm.  Arrange stress test to rule out ischemic substrate; patient states she was not able to ambulate on treadmill and will  need to be pharmacologic stress nuclear study.  Check echo to assess EF/valvular disease.  Blood pressure elevated at today's visit.  Repeat blood pressure 152/88.  Discussed adding low-dose lisinopril and checking BMP in 1 week.  She will obtain blood pressure cuff and check her blood pressure at home to see if medications need further titration.        Follow up:  Return in about 6 months (around 07/27/2023).      The patient was given the opportunity to ask questions and those questions were answered to the patient's satisfaction. The patient was encouraged to call with any additional questions or concerns. Discussed with the effects and side effects of medications. Medication safety was discussed. The patient was informed to contact the office within 7 business days if a message/lab results/referral/imaging results have not been conveyed to the patient.       Jori Moll, APRN,NP-C  [facility]  Cardiology  [facility] Medicine    A portion of this documentation may have been generated using [facility] voice recognition software and may contain syntax/voice recognition errors.

## 2023-01-28 ENCOUNTER — Inpatient Hospital Stay (HOSPITAL_COMMUNITY)
Admission: RE | Admit: 2023-01-28 | Payer: Commercial Managed Care - PPO | Source: Ambulatory Visit | Admitting: THORACIC SURGERY CARDIOTHORACIC VASCULAR SURGERY

## 2023-01-28 ENCOUNTER — Inpatient Hospital Stay (HOSPITAL_COMMUNITY)
Admission: EM | Admit: 2023-01-28 | Payer: Commercial Managed Care - PPO | Source: Ambulatory Visit | Admitting: Student in an Organized Health Care Education/Training Program

## 2023-01-28 ENCOUNTER — Inpatient Hospital Stay (HOSPITAL_COMMUNITY): Payer: Commercial Managed Care - PPO | Admitting: THORACIC SURGERY CARDIOTHORACIC VASCULAR SURGERY

## 2023-01-28 ENCOUNTER — Inpatient Hospital Stay
Admission: RE | Admit: 2023-01-28 | Discharge: 2023-02-13 | DRG: 234 | Disposition: A | Discharge status: Home Health | Payer: Commercial Managed Care - PPO | Source: Ambulatory Visit | Attending: THORACIC SURGERY CARDIOTHORACIC VASCULAR SURGERY | Admitting: THORACIC SURGERY CARDIOTHORACIC VASCULAR SURGERY

## 2023-01-28 ENCOUNTER — Encounter (HOSPITAL_COMMUNITY): Payer: Commercial Managed Care - PPO

## 2023-01-28 ENCOUNTER — Inpatient Hospital Stay (HOSPITAL_COMMUNITY): Payer: Commercial Managed Care - PPO | Admitting: Student in an Organized Health Care Education/Training Program

## 2023-01-28 DIAGNOSIS — J449 Chronic obstructive pulmonary disease, unspecified: Secondary | ICD-10-CM | POA: Diagnosis present

## 2023-01-28 DIAGNOSIS — R079 Chest pain, unspecified: Principal | ICD-10-CM | POA: Diagnosis present

## 2023-01-28 DIAGNOSIS — Z7982 Long term (current) use of aspirin: Secondary | ICD-10-CM

## 2023-01-28 DIAGNOSIS — E119 Type 2 diabetes mellitus without complications: Secondary | ICD-10-CM | POA: Diagnosis present

## 2023-01-28 DIAGNOSIS — F418 Other specified anxiety disorders: Secondary | ICD-10-CM

## 2023-01-28 DIAGNOSIS — F32A Depression, unspecified: Secondary | ICD-10-CM | POA: Diagnosis present

## 2023-01-28 DIAGNOSIS — I25110 Atherosclerotic heart disease of native coronary artery with unstable angina pectoris: Secondary | ICD-10-CM | POA: Diagnosis present

## 2023-01-28 DIAGNOSIS — F17200 Nicotine dependence, unspecified, uncomplicated: Principal | ICD-10-CM | POA: Diagnosis present

## 2023-01-28 DIAGNOSIS — R7303 Prediabetes: Secondary | ICD-10-CM | POA: Insufficient documentation

## 2023-01-28 DIAGNOSIS — Z951 Presence of aortocoronary bypass graft: Secondary | ICD-10-CM

## 2023-01-28 DIAGNOSIS — Z853 Personal history of malignant neoplasm of breast: Secondary | ICD-10-CM

## 2023-01-28 DIAGNOSIS — Z7902 Long term (current) use of antithrombotics/antiplatelets: Secondary | ICD-10-CM

## 2023-01-28 DIAGNOSIS — Z8249 Family history of ischemic heart disease and other diseases of the circulatory system: Secondary | ICD-10-CM

## 2023-01-28 DIAGNOSIS — I10 Essential (primary) hypertension: Secondary | ICD-10-CM | POA: Diagnosis present

## 2023-01-28 DIAGNOSIS — E785 Hyperlipidemia, unspecified: Secondary | ICD-10-CM | POA: Diagnosis present

## 2023-01-28 DIAGNOSIS — Z9013 Acquired absence of bilateral breasts and nipples: Secondary | ICD-10-CM

## 2023-01-28 DIAGNOSIS — F17210 Nicotine dependence, cigarettes, uncomplicated: Secondary | ICD-10-CM | POA: Diagnosis present

## 2023-01-28 DIAGNOSIS — R040 Epistaxis: Secondary | ICD-10-CM | POA: Diagnosis not present

## 2023-01-28 DIAGNOSIS — F419 Anxiety disorder, unspecified: Secondary | ICD-10-CM | POA: Diagnosis present

## 2023-01-28 DIAGNOSIS — I480 Paroxysmal atrial fibrillation: Secondary | ICD-10-CM | POA: Diagnosis present

## 2023-01-28 DIAGNOSIS — T82855A Stenosis of coronary artery stent, initial encounter: Principal | ICD-10-CM | POA: Diagnosis present

## 2023-01-28 DIAGNOSIS — Z79899 Other long term (current) drug therapy: Secondary | ICD-10-CM

## 2023-01-28 DIAGNOSIS — I959 Hypotension, unspecified: Secondary | ICD-10-CM

## 2023-01-28 DIAGNOSIS — C50919 Malignant neoplasm of unspecified site of unspecified female breast: Secondary | ICD-10-CM

## 2023-01-28 DIAGNOSIS — Z9862 Peripheral vascular angioplasty status: Secondary | ICD-10-CM

## 2023-01-29 ENCOUNTER — Encounter (HOSPITAL_COMMUNITY): Payer: Commercial Managed Care - PPO

## 2023-01-29 DIAGNOSIS — I2510 Atherosclerotic heart disease of native coronary artery without angina pectoris: Secondary | ICD-10-CM

## 2023-01-29 DIAGNOSIS — R079 Chest pain, unspecified: Secondary | ICD-10-CM

## 2023-01-29 DIAGNOSIS — I10 Essential (primary) hypertension: Secondary | ICD-10-CM

## 2023-01-30 ENCOUNTER — Encounter (HOSPITAL_COMMUNITY): Payer: Commercial Managed Care - PPO

## 2023-01-30 ENCOUNTER — Encounter (INDEPENDENT_AMBULATORY_CARE_PROVIDER_SITE_OTHER): Payer: Self-pay | Admitting: INTERVENTIONAL CARDIOLOGY

## 2023-01-30 DIAGNOSIS — R079 Chest pain, unspecified: Secondary | ICD-10-CM

## 2023-01-30 DIAGNOSIS — I081 Rheumatic disorders of both mitral and tricuspid valves: Secondary | ICD-10-CM

## 2023-01-30 DIAGNOSIS — I259 Chronic ischemic heart disease, unspecified: Secondary | ICD-10-CM

## 2023-01-30 NOTE — Procedures (Signed)
CARDIOLOGY [facility], [facility]  [address]  [place] [address]    Procedure Note    Name: [name] MRN:  [id number]   Date: 01/25/2023 DOB:  1955/11/09 (67 y.o.)         ECG - In Clinic (Non-[facility])    Performed by: Casimer Bilis, MD  Authorized by: Jori Moll, APRN,NP-C    Documentation:        SR, 62bpm  Incomplete RBBB    Casimer Bilis, MD

## 2023-01-31 ENCOUNTER — Other Ambulatory Visit (INDEPENDENT_AMBULATORY_CARE_PROVIDER_SITE_OTHER): Payer: Self-pay

## 2023-01-31 ENCOUNTER — Other Ambulatory Visit (HOSPITAL_COMMUNITY): Payer: Self-pay | Admitting: INTERVENTIONAL CARDIOLOGY

## 2023-01-31 ENCOUNTER — Encounter (HOSPITAL_COMMUNITY)

## 2023-01-31 DIAGNOSIS — I2510 Atherosclerotic heart disease of native coronary artery without angina pectoris: Secondary | ICD-10-CM

## 2023-01-31 DIAGNOSIS — T82855A Stenosis of coronary artery stent, initial encounter: Secondary | ICD-10-CM

## 2023-01-31 DIAGNOSIS — F17200 Nicotine dependence, unspecified, uncomplicated: Secondary | ICD-10-CM

## 2023-02-01 ENCOUNTER — Encounter (HOSPITAL_COMMUNITY): Payer: Commercial Managed Care - PPO

## 2023-02-01 DIAGNOSIS — I2510 Atherosclerotic heart disease of native coronary artery without angina pectoris: Secondary | ICD-10-CM

## 2023-02-01 DIAGNOSIS — J449 Chronic obstructive pulmonary disease, unspecified: Secondary | ICD-10-CM

## 2023-02-01 DIAGNOSIS — I10 Essential (primary) hypertension: Secondary | ICD-10-CM

## 2023-02-01 DIAGNOSIS — C50919 Malignant neoplasm of unspecified site of unspecified female breast: Secondary | ICD-10-CM

## 2023-02-01 DIAGNOSIS — F419 Anxiety disorder, unspecified: Secondary | ICD-10-CM

## 2023-02-02 ENCOUNTER — Other Ambulatory Visit: Payer: Self-pay

## 2023-02-02 ENCOUNTER — Encounter (HOSPITAL_BASED_OUTPATIENT_CLINIC_OR_DEPARTMENT_OTHER): Payer: Self-pay | Admitting: Plastic Surgery

## 2023-02-03 ENCOUNTER — Encounter (INDEPENDENT_AMBULATORY_CARE_PROVIDER_SITE_OTHER): Payer: Self-pay | Admitting: INTERVENTIONAL CARDIOLOGY

## 2023-02-03 ENCOUNTER — Encounter (HOSPITAL_COMMUNITY): Payer: Commercial Managed Care - PPO

## 2023-02-03 DIAGNOSIS — I25110 Atherosclerotic heart disease of native coronary artery with unstable angina pectoris: Secondary | ICD-10-CM

## 2023-02-03 DIAGNOSIS — R079 Chest pain, unspecified: Secondary | ICD-10-CM

## 2023-02-03 DIAGNOSIS — I10 Essential (primary) hypertension: Secondary | ICD-10-CM

## 2023-02-03 DIAGNOSIS — F32A Depression, unspecified: Secondary | ICD-10-CM

## 2023-02-03 DIAGNOSIS — C50919 Malignant neoplasm of unspecified site of unspecified female breast: Secondary | ICD-10-CM

## 2023-02-03 DIAGNOSIS — J449 Chronic obstructive pulmonary disease, unspecified: Secondary | ICD-10-CM

## 2023-02-03 DIAGNOSIS — F17200 Nicotine dependence, unspecified, uncomplicated: Secondary | ICD-10-CM

## 2023-02-03 DIAGNOSIS — F418 Other specified anxiety disorders: Secondary | ICD-10-CM

## 2023-02-06 DIAGNOSIS — I10 Essential (primary) hypertension: Secondary | ICD-10-CM

## 2023-02-06 DIAGNOSIS — C50919 Malignant neoplasm of unspecified site of unspecified female breast: Secondary | ICD-10-CM

## 2023-02-06 DIAGNOSIS — J449 Chronic obstructive pulmonary disease, unspecified: Secondary | ICD-10-CM

## 2023-02-06 DIAGNOSIS — Z955 Presence of coronary angioplasty implant and graft: Secondary | ICD-10-CM

## 2023-02-06 DIAGNOSIS — Z951 Presence of aortocoronary bypass graft: Secondary | ICD-10-CM

## 2023-02-06 DIAGNOSIS — E872 Acidosis, unspecified: Secondary | ICD-10-CM

## 2023-02-06 DIAGNOSIS — R079 Chest pain, unspecified: Secondary | ICD-10-CM

## 2023-02-06 DIAGNOSIS — F17200 Nicotine dependence, unspecified, uncomplicated: Secondary | ICD-10-CM

## 2023-02-06 DIAGNOSIS — T82855A Stenosis of coronary artery stent, initial encounter: Secondary | ICD-10-CM

## 2023-02-06 DIAGNOSIS — F418 Other specified anxiety disorders: Secondary | ICD-10-CM

## 2023-02-06 DIAGNOSIS — I25110 Atherosclerotic heart disease of native coronary artery with unstable angina pectoris: Secondary | ICD-10-CM

## 2023-02-07 ENCOUNTER — Encounter (HOSPITAL_COMMUNITY): Payer: Commercial Managed Care - PPO

## 2023-02-07 DIAGNOSIS — R079 Chest pain, unspecified: Principal | ICD-10-CM | POA: Diagnosis present

## 2023-02-07 DIAGNOSIS — Z951 Presence of aortocoronary bypass graft: Secondary | ICD-10-CM

## 2023-02-07 DIAGNOSIS — C50919 Malignant neoplasm of unspecified site of unspecified female breast: Secondary | ICD-10-CM

## 2023-02-07 DIAGNOSIS — I10 Essential (primary) hypertension: Secondary | ICD-10-CM

## 2023-02-07 DIAGNOSIS — Z9889 Other specified postprocedural states: Secondary | ICD-10-CM

## 2023-02-07 DIAGNOSIS — J449 Chronic obstructive pulmonary disease, unspecified: Secondary | ICD-10-CM

## 2023-02-07 DIAGNOSIS — I25110 Atherosclerotic heart disease of native coronary artery with unstable angina pectoris: Secondary | ICD-10-CM

## 2023-02-07 DIAGNOSIS — F418 Other specified anxiety disorders: Secondary | ICD-10-CM

## 2023-02-07 MED ORDER — CHOLECALCIFEROL (VITAMIN D3) 25 MCG (1,000 UNIT) TABLET
1000.0000 [IU] | ORAL_TABLET | Freq: Every day | ORAL | Status: DC
Start: 2023-02-08 — End: 2023-02-13
  Administered 2023-02-08 – 2023-02-13 (×6): 1000 [IU] via ORAL
  Filled 2023-02-07 (×6): qty 1

## 2023-02-07 MED ORDER — GLUCAGON 1 MG SOLUTION FOR INJECTION
1.0000 mg | Freq: Once | INTRAMUSCULAR | Status: DC | PRN
Start: 2023-02-08 — End: 2023-02-13

## 2023-02-07 MED ORDER — DEXTROSE 40 % ORAL GEL
15.0000 g | ORAL | Status: DC | PRN
Start: 2023-02-08 — End: 2023-02-13
  Filled 2023-02-07: qty 15

## 2023-02-07 MED ORDER — OMEGA-3 ACID ETHYL ESTERS 1 GRAM CAPSULE
1.0000 g | ORAL_CAPSULE | Freq: Every day | ORAL | Status: DC
Start: 2023-02-08 — End: 2023-02-13
  Administered 2023-02-08 – 2023-02-13 (×6): 1 g via ORAL
  Filled 2023-02-07 (×6): qty 1

## 2023-02-07 MED ORDER — DEXTROSE 50 % IN WATER (D50W) INTRAVENOUS SYRINGE
12.5000 g | INJECTION | INTRAVENOUS | Status: DC | PRN
Start: 2023-02-07 — End: 2023-02-07

## 2023-02-07 MED ORDER — ELECTROLYTE-A INTRAVENOUS SOLUTION
INTRAVENOUS | Status: DC
Start: 2023-02-08 — End: 2023-02-08
  Administered 2023-02-08: 0 mL via INTRAVENOUS
  Filled 2023-02-07: qty 1000

## 2023-02-07 MED ORDER — IPRATROPIUM BROMIDE 0.02 % SOLUTION FOR INHALATION
0.5000 mg | RESPIRATORY_TRACT | Status: DC | PRN
Start: 2023-02-08 — End: 2023-02-13
  Administered 2023-02-08: 0 mg via RESPIRATORY_TRACT
  Administered 2023-02-09: 0.5 mg via RESPIRATORY_TRACT
  Administered 2023-02-09: 0 mg via RESPIRATORY_TRACT

## 2023-02-07 MED ORDER — ONDANSETRON HCL (PF) 4 MG/2 ML INJECTION SOLUTION
4.0000 mg | INTRAMUSCULAR | Status: DC | PRN
Start: 2023-02-08 — End: 2023-02-13
  Administered 2023-02-08 – 2023-02-11 (×3): 4 mg via INTRAVENOUS
  Filled 2023-02-07 (×3): qty 2

## 2023-02-07 MED ORDER — OXYCODONE 5 MG TABLET
10.0000 mg | ORAL_TABLET | ORAL | Status: DC | PRN
Start: 2023-02-08 — End: 2023-02-13
  Administered 2023-02-08 – 2023-02-09 (×3): 10 mg via ORAL
  Filled 2023-02-07 (×3): qty 2

## 2023-02-07 MED ORDER — CLOPIDOGREL 75 MG TABLET
75.0000 mg | ORAL_TABLET | Freq: Every day | ORAL | Status: DC
Start: 2023-02-08 — End: 2023-02-13
  Administered 2023-02-08 – 2023-02-13 (×6): 75 mg via ORAL
  Filled 2023-02-07 (×6): qty 1

## 2023-02-07 MED ORDER — PANTOPRAZOLE 40 MG TABLET,DELAYED RELEASE
40.0000 mg | DELAYED_RELEASE_TABLET | Freq: Every day | ORAL | Status: DC
Start: 2023-02-08 — End: 2023-02-13
  Administered 2023-02-08 – 2023-02-13 (×6): 40 mg via ORAL
  Filled 2023-02-07 (×7): qty 1

## 2023-02-07 MED ORDER — LEVALBUTEROL 1.25 MG/3 ML SOLUTION FOR NEBULIZATION
1.2500 mg | INHALATION_SOLUTION | RESPIRATORY_TRACT | Status: DC | PRN
Start: 2023-02-08 — End: 2023-02-13
  Administered 2023-02-08: 0 mg via RESPIRATORY_TRACT
  Administered 2023-02-09 – 2023-02-12 (×4): 1.25 mg via RESPIRATORY_TRACT

## 2023-02-07 MED ORDER — HYDROMORPHONE 0.5 MG/0.5 ML INJECTION SYRINGE
0.2000 mg | INJECTION | INTRAMUSCULAR | Status: DC | PRN
Start: 2023-02-08 — End: 2023-02-13
  Administered 2023-02-08 (×2): 0.2 mg via INTRAVENOUS
  Filled 2023-02-07 (×2): qty 0.5

## 2023-02-07 MED ORDER — MAGNESIUM HYDROXIDE 400 MG/5 ML ORAL SUSPENSION
30.0000 mL | Freq: Every day | ORAL | Status: DC | PRN
Start: 2023-02-08 — End: 2023-02-13

## 2023-02-07 MED ORDER — ATORVASTATIN 40 MG TABLET
40.0000 mg | ORAL_TABLET | Freq: Every evening | ORAL | Status: DC
Start: 2023-02-08 — End: 2023-02-13
  Administered 2023-02-08 – 2023-02-12 (×5): 40 mg via ORAL
  Filled 2023-02-07 (×6): qty 1

## 2023-02-07 MED ORDER — SODIUM CHLORIDE 0.9 % INTRAVENOUS SOLUTION
INTRAVENOUS | Status: AC
Start: 2023-02-08 — End: ?
  Administered 2023-02-08: 0 mL via INTRAVENOUS

## 2023-02-07 MED ORDER — DEXTROSE 50 % IN WATER (D50W) INTRAVENOUS SYRINGE
12.5000 g | INJECTION | INTRAVENOUS | Status: DC | PRN
Start: 2023-02-08 — End: 2023-02-13

## 2023-02-07 MED ORDER — ACETAMINOPHEN 325 MG TABLET
650.0000 mg | ORAL_TABLET | ORAL | Status: DC | PRN
Start: 2023-02-08 — End: 2023-02-13
  Administered 2023-02-12: 650 mg via ORAL
  Filled 2023-02-07: qty 2

## 2023-02-07 MED ORDER — DEXTROSE 40 % ORAL GEL
15.0000 g | ORAL | Status: DC | PRN
Start: 2023-02-07 — End: 2023-02-07
  Filled 2023-02-07: qty 15

## 2023-02-07 MED ORDER — INSULIN LISPRO 100 UNIT/ML SUB-Q SSIP VIAL
0.0000 [IU] | INJECTION | Freq: Four times a day (QID) | SUBCUTANEOUS | Status: DC
Start: 2023-02-08 — End: 2023-02-13
  Administered 2023-02-08: 0 [IU] via SUBCUTANEOUS
  Administered 2023-02-08: 2 [IU] via SUBCUTANEOUS
  Administered 2023-02-08 – 2023-02-09 (×3): 0 [IU] via SUBCUTANEOUS
  Administered 2023-02-09: 2 [IU] via SUBCUTANEOUS
  Administered 2023-02-09 – 2023-02-10 (×3): 0 [IU] via SUBCUTANEOUS
  Administered 2023-02-10 (×2): 2 [IU] via SUBCUTANEOUS
  Administered 2023-02-10 – 2023-02-11 (×5): 0 [IU] via SUBCUTANEOUS
  Administered 2023-02-12: 2 [IU] via SUBCUTANEOUS
  Administered 2023-02-12 (×2): 0 [IU] via SUBCUTANEOUS
  Administered 2023-02-12: 2 [IU] via SUBCUTANEOUS
  Administered 2023-02-13 (×2): 0 [IU] via SUBCUTANEOUS

## 2023-02-07 MED ORDER — OXYCODONE 5 MG TABLET
5.0000 mg | ORAL_TABLET | ORAL | Status: DC | PRN
Start: 2023-02-08 — End: 2023-02-13
  Administered 2023-02-08 – 2023-02-11 (×4): 5 mg via ORAL
  Filled 2023-02-07 (×4): qty 1

## 2023-02-07 MED ORDER — MUPIROCIN 2 % TOPICAL OINTMENT
TOPICAL_OINTMENT | Freq: Two times a day (BID) | CUTANEOUS | Status: AC
Start: 2023-02-08 — End: 2023-02-11
  Administered 2023-02-11: 1 via TOPICAL

## 2023-02-07 MED ORDER — OXYCODONE-ACETAMINOPHEN 5 MG-325 MG TABLET
1.0000 | ORAL_TABLET | Freq: Four times a day (QID) | ORAL | Status: DC | PRN
Start: 2023-02-08 — End: 2023-02-13
  Administered 2023-02-08 – 2023-02-10 (×2): 1 via ORAL
  Filled 2023-02-07: qty 1

## 2023-02-07 MED ORDER — FLUTICASONE PROPIONATE 50 MCG/ACTUATION NASAL SPRAY,SUSPENSION
1.0000 | Freq: Once | NASAL | Status: DC | PRN
Start: 2023-02-08 — End: 2023-02-13
  Filled 2023-02-07: qty 1

## 2023-02-07 MED ORDER — NITROGLYCERIN 0.4 MG SUBLINGUAL TABLET
0.4000 mg | SUBLINGUAL_TABLET | SUBLINGUAL | Status: DC | PRN
Start: 2023-02-08 — End: 2023-02-13

## 2023-02-07 MED ORDER — NITROGLYCERIN 50 MG/250 ML (200 MCG/ML) IN 5 % DEXTROSE INTRAVENOUS
5.0000 ug/min | INTRAVENOUS | Status: DC
Start: 2023-02-08 — End: 2023-02-08
  Administered 2023-02-08: 0 ug/min via INTRAVENOUS

## 2023-02-07 MED ORDER — ANASTROZOLE 1 MG TABLET
1.0000 mg | ORAL_TABLET | Freq: Every day | ORAL | Status: DC
Start: 2023-02-08 — End: 2023-02-13
  Administered 2023-02-08 – 2023-02-13 (×6): 1 mg via ORAL
  Filled 2023-02-07 (×7): qty 1

## 2023-02-07 MED ORDER — DEXMEDETOMIDINE 400 MCG/100 ML (4 MCG/ML) IN 0.9 % SODIUM CHLORIDE IV
0.3000 ug/kg/h | INTRAVENOUS | Status: DC
Start: 2023-02-08 — End: 2023-02-08
  Administered 2023-02-08: 0 ug/kg/h via INTRAVENOUS

## 2023-02-07 MED ORDER — CEFAZOLIN 10 GRAM SOLUTION FOR INJECTION
3.0000 g | Freq: Three times a day (TID) | INTRAMUSCULAR | Status: AC
Start: 2023-02-08 — End: 2023-02-08
  Administered 2023-02-08: 0 g via INTRAVENOUS
  Administered 2023-02-08: 3 g via INTRAVENOUS
  Filled 2023-02-07: qty 15

## 2023-02-07 MED ORDER — GLUCAGON 1 MG SOLUTION FOR INJECTION
1.0000 mg | Freq: Once | INTRAMUSCULAR | Status: DC | PRN
Start: 2023-02-07 — End: 2023-02-07

## 2023-02-07 MED ORDER — TRAZODONE 150 MG TABLET
150.0000 mg | ORAL_TABLET | Freq: Every evening | ORAL | Status: DC
Start: 2023-02-08 — End: 2023-02-13
  Administered 2023-02-08 – 2023-02-12 (×5): 150 mg via ORAL
  Filled 2023-02-07 (×7): qty 1

## 2023-02-07 MED ORDER — ACETAMINOPHEN 325 MG TABLET
975.0000 mg | ORAL_TABLET | Freq: Four times a day (QID) | ORAL | Status: AC
Start: 2023-02-08 — End: 2023-02-08
  Administered 2023-02-08: 975 mg via ORAL
  Filled 2023-02-07: qty 3

## 2023-02-07 MED ORDER — BISACODYL 10 MG RECTAL SUPPOSITORY
10.0000 mg | Freq: Every day | RECTAL | Status: DC | PRN
Start: 2023-02-08 — End: 2023-02-13
  Filled 2023-02-07: qty 1

## 2023-02-07 MED ORDER — METOPROLOL TARTRATE 25 MG TABLET
12.5000 mg | ORAL_TABLET | Freq: Two times a day (BID) | ORAL | Status: DC
Start: 2023-02-08 — End: 2023-02-09
  Administered 2023-02-08 – 2023-02-09 (×3): 12.5 mg via ORAL
  Filled 2023-02-07 (×2): qty 1

## 2023-02-07 MED ORDER — ASCORBIC ACID (VITAMIN C) 500 MG TABLET
500.0000 mg | ORAL_TABLET | Freq: Every day | ORAL | Status: DC
Start: 2023-02-08 — End: 2023-02-13
  Administered 2023-02-08 – 2023-02-13 (×6): 500 mg via ORAL
  Filled 2023-02-07 (×6): qty 1

## 2023-02-07 MED ORDER — FAMOTIDINE (PF) 20 MG/50 ML IN 0.9 % NACL (ISO) INTRAVENOUS PIGGYBACK
20.0000 mg | INJECTION | Freq: Two times a day (BID) | INTRAVENOUS | Status: DC
Start: 2023-02-08 — End: 2023-02-08
  Administered 2023-02-08: 0 mL via INTRAVENOUS
  Filled 2023-02-07 (×4): qty 50

## 2023-02-07 MED ORDER — SERTRALINE 100 MG TABLET
100.0000 mg | ORAL_TABLET | Freq: Every day | ORAL | Status: DC
Start: 2023-02-08 — End: 2023-02-13
  Administered 2023-02-08 – 2023-02-13 (×6): 100 mg via ORAL
  Filled 2023-02-07 (×7): qty 1

## 2023-02-07 MED ORDER — ASPIRIN 81 MG CHEWABLE TABLET
81.0000 mg | CHEWABLE_TABLET | Freq: Every day | ORAL | Status: DC
Start: 2023-02-08 — End: 2023-02-13
  Administered 2023-02-08 – 2023-02-13 (×6): 81 mg via ORAL
  Filled 2023-02-07 (×6): qty 1

## 2023-02-07 MED ORDER — NOREPINEPHRINE BITARTRATE 4 MG/250 ML (16 MCG/ML) IN DEXTROSE 5 % IV
0.0300 ug/kg/min | INTRAVENOUS | Status: DC
Start: 2023-02-07 — End: 2023-02-07
  Administered 2023-02-08: 0 ug/kg/min via INTRAVENOUS

## 2023-02-07 MED ORDER — CLONAZEPAM 0.5 MG TABLET
0.5000 mg | ORAL_TABLET | Freq: Two times a day (BID) | ORAL | Status: DC | PRN
Start: 2023-02-08 — End: 2023-02-13
  Administered 2023-02-09 – 2023-02-12 (×4): 0.5 mg via ORAL
  Filled 2023-02-07 (×4): qty 1

## 2023-02-07 MED ORDER — EPINEPHRINE 1 MG/ML INJECTION SOLUTION
0.0200 ug/kg/min | INTRAMUSCULAR | Status: DC
Start: 2023-02-08 — End: 2023-02-08
  Administered 2023-02-08: 0 ug/kg/min via INTRAVENOUS
  Filled 2023-02-07 (×2): qty 4

## 2023-02-07 MED ORDER — NOREPINEPHRINE BITARTRATE 4 MG/250 ML (16 MCG/ML) IN DEXTROSE 5 % IV
0.0300 ug/kg/min | INTRAVENOUS | Status: DC
Start: 2023-02-08 — End: 2023-02-08
  Administered 2023-02-08: 0 ug/kg/min via INTRAVENOUS

## 2023-02-07 MED ORDER — MULTIVITAMIN WITH FOLIC ACID 400 MCG TABLET
1.0000 | ORAL_TABLET | Freq: Every day | ORAL | Status: DC
Start: 2023-02-08 — End: 2023-02-13
  Administered 2023-02-08 – 2023-02-13 (×6): 1 via ORAL
  Filled 2023-02-07 (×7): qty 1

## 2023-02-08 ENCOUNTER — Inpatient Hospital Stay (HOSPITAL_COMMUNITY): Payer: Commercial Managed Care - PPO

## 2023-02-08 ENCOUNTER — Other Ambulatory Visit: Payer: Self-pay

## 2023-02-08 DIAGNOSIS — I2510 Atherosclerotic heart disease of native coronary artery without angina pectoris: Secondary | ICD-10-CM

## 2023-02-08 DIAGNOSIS — T82855A Stenosis of coronary artery stent, initial encounter: Secondary | ICD-10-CM

## 2023-02-08 DIAGNOSIS — Z951 Presence of aortocoronary bypass graft: Secondary | ICD-10-CM | POA: Diagnosis not present

## 2023-02-08 DIAGNOSIS — R7303 Prediabetes: Secondary | ICD-10-CM

## 2023-02-08 LAB — CBC WITH DIFF
BASOPHIL #: <0.1 10*3/uL (ref <=0.20)
BASOPHIL %: 0.2 %
EOSINOPHIL #: <0.1 10*3/uL (ref <=0.50)
EOSINOPHIL %: 0.5 %
HCT: 22.3 % — ABNORMAL LOW (ref 34.8–46.0)
HGB: 7 g/dL — ABNORMAL LOW (ref 11.5–16.0)
IMMATURE GRANULOCYTE #: <0.1 10*3/uL (ref <0.10)
IMMATURE GRANULOCYTE %: 0.6 % (ref 0.0–1.0)
LYMPHOCYTE #: 1.02 10*3/uL (ref 1.00–4.80)
LYMPHOCYTE %: 10 %
MCH: 28.7 pg (ref 26.0–32.0)
MCHC: 31.4 g/dL (ref 31.0–35.5)
MCV: 91.4 fL (ref 78.0–100.0)
MONOCYTE #: 0.79 10*3/uL (ref 0.20–1.10)
MONOCYTE %: 7.7 %
MPV: 10.6 fL (ref 8.7–12.5)
NEUTROPHIL #: 8.28 10*3/uL — ABNORMAL HIGH (ref 1.50–7.70)
NEUTROPHIL %: 81 %
PLATELETS: 107 10*3/uL — ABNORMAL LOW (ref 150–400)
RBC: 2.44 10*6/uL — ABNORMAL LOW (ref 3.85–5.22)
RDW-CV: 14 % (ref 11.5–15.5)
WBC: 10.2 10*3/uL (ref 3.7–11.0)

## 2023-02-08 LAB — BASIC METABOLIC PANEL
BUN/CREA RATIO: 17
BUN: 11 mg/dL (ref 8–23)
CALCIUM: 8.9 mg/dL (ref 8.3–10.7)
CHLORIDE: 107 mmol/L — ABNORMAL HIGH (ref 96–106)
CO2 TOTAL: 27 mmol/L (ref 22–30)
CREATININE: 0.64 mg/dL (ref 0.50–0.90)
ESTIMATED GFR: >90 mL/min/{1.73_m2} (ref >90)
GLUCOSE: 139 mg/dL — ABNORMAL HIGH (ref 74–109)
POTASSIUM: 3.8 mmol/L (ref 3.2–5.0)
SODIUM: 144 mmol/L (ref 133–144)

## 2023-02-08 LAB — IONIZED CALCIUM WITH PH
IONIZED CALCIUM: 1.17 mmol/L (ref 1.15–1.33)
PH (VENOUS): 7.47 — ABNORMAL HIGH (ref 7.32–7.43)

## 2023-02-08 LAB — MAGNESIUM: MAGNESIUM: 1.7 mg/dL (ref 1.6–2.4)

## 2023-02-08 LAB — POC BLOOD GLUCOSE (RESULTS)
GLUCOSE, POC: 157 mg/dl — ABNORMAL HIGH (ref 70–100)
GLUCOSE, POC: 131 mg/dl — ABNORMAL HIGH (ref 70–100)
GLUCOSE, POC: 144 mg/dl — ABNORMAL HIGH (ref 70–100)

## 2023-02-08 MED ORDER — SENNOSIDES 8.6 MG-DOCUSATE SODIUM 50 MG TABLET
1.0000 | ORAL_TABLET | Freq: Two times a day (BID) | ORAL | Status: DC
Start: 2023-02-08 — End: 2023-02-13
  Administered 2023-02-08 – 2023-02-10 (×6): 1 via ORAL
  Administered 2023-02-11: 0 via ORAL
  Administered 2023-02-11 – 2023-02-13 (×4): 1 via ORAL
  Filled 2023-02-08 (×10): qty 1

## 2023-02-08 MED ORDER — GUAIFENESIN ER 600 MG TABLET, EXTENDED RELEASE 12 HR
600.0000 mg | EXTENDED_RELEASE_TABLET | Freq: Two times a day (BID) | ORAL | Status: DC
Start: 2023-02-08 — End: 2023-02-13
  Administered 2023-02-08 – 2023-02-13 (×10): 600 mg via ORAL
  Filled 2023-02-08 (×11): qty 1

## 2023-02-08 MED ORDER — ELECTROLYTE-A INTRAVENOUS SOLUTION BOLUS
30.0000 mL/kg | INTRAVENOUS | Status: DC
Start: 2023-02-08 — End: 2023-02-08

## 2023-02-08 NOTE — Respiratory Therapy (Signed)
Pt has BiPAP ordered QHS has not been wearing

## 2023-02-08 NOTE — Care Plan (Signed)
Problem: Fall Injury Risk  Goal: Absence of Fall and Fall-Related Injury  Outcome: Ongoing (see interventions/notes)     Problem: Pain Acute  Goal: Optimal Pain Control and Function  Outcome: Ongoing (see interventions/notes)

## 2023-02-08 NOTE — Progress Notes (Signed)
[facility] [place]  Progress Note    [name]  Date of service: 02/08/2023   Date of Admission:  01/28/2023    Hospital Day:  LOS: 11 days   Subjective: [name] is a 67 year old female who initially presented to the hospital on 04/20 secondary to headache with associated epigastric pain and hypertension.  Did have episode of chest pain that resolved spontaneously but this headache has been ongoing since.  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  We are consulted postoperatively for critical care management.  [name] still drowsy postprocedure but has been extubated.  Per report is a current everyday smoker.  Unable to obtain full HPI/ROS.    4/30:  Awake and alert.  On 2 L of oxygen.  Has been out of bed most of the morning.  Had several episodes of nausea and vomiting overnight for which she received IV fluid.  Nausea a little better this a.m. but still not much appetite.  Was able to eat a few crackers.  She denies shortness of breath or fever.  She does have postoperative surgical discomfort that has been well managed with analgesia.    5/1:  Up in chair currently on 2 L nasal cannula satting well with minimal shortness of breath.  Still having some issues with deep breath however getting chest tubes and wires out today.    Vital Signs:  Temp  Avg: 36.9 C (98.4 F)  Min: 36.7 C (98.1 F)  Max: 37 C (98.6 F)    Pulse  Avg: 95  Min: 82  Max: 118 BP  Min: 96/67  Max: 157/83   Resp  Avg: 21.1  Min: 18  Max: 28 SpO2  Avg: 96.2 %  Min: 94 %  Max: 98 %          Input/Output    Intake/Output Summary (Last 24 hours) at 02/08/2023 1315  Last data filed at 02/08/2023 1200  Gross per 24 hour   Intake 630 ml   Output  910 ml   Net -280 ml    I/O last shift:  05/01 0700 - 05/01 1859  In: 480 [P.O.:480]  Out: 310 [Urine:300; Drains:10]   acetaminophen (TYLENOL) tablet, 650 mg, Oral, Q4H PRN  anastrozole (ARIMIDEX) tablet, 1 mg, Oral, Daily  ascorbic acid (VITAMIN C) tablet, 500 mg, Oral, Daily  aspirin chewable tablet 81 mg, 81 mg, Oral, Daily  atorvastatin (LIPITOR) tablet, 40 mg, Oral, QPM  bisacodyl (DULCOLAX) rectal suppository, 10 mg, Rectal, Daily PRN  cholecalciferol (VITAMIN D3) 1000 unit (25 mcg) tablet, 1,000 Units, Oral, Daily  clonazePAM (klonoPIN) tablet, 0.5 mg, Oral, Q12H PRN  clopidogrel (PLAVIX) 75 mg tablet, 75 mg, Oral, Daily  Correction/SSIP insulin lispro (HumaLOG) 100 units/mL injection, 0-18 Units, Subcutaneous, 4x/day AC  dextrose (GLUTOSE) 40% oral gel, 15 g, Oral, Q15 Min PRN  dextrose 50% (0.5 g/mL) injection - syringe, 12.5 g, Intravenous, Q15 Min PRN  fluticasone (FLONASE) 50 mcg per spray nasal spray, 1 Spray, Each Nostril, Once PRN  glucagon (GLUCAGEN) injection 1 mg, 1 mg, IntraMUSCULAR, Once PRN  HYDROmorphone (DILAUDID) 0.5 mg/0.5 mL injection, 0.2 mg, Intravenous, Q4H PRN  ipratropium (ATROVENT) 0.02% nebulizer solution, 0.5 mg, Nebulization,  Q4H PRN  levalbuterol (XOPENEX) 1.25 mg/ 3 mL nebulizer solution, 1.25 mg, Nebulization, Q4H PRN  magnesium hydroxide (MILK OF MAGNESIA) 400mg  per 5mL oral liquid, 30 mL, Oral, Daily PRN  metoprolol tartrate (LOPRESSOR) tablet, 12.5 mg, Oral, 2x/day  multivitamin (THERA) tablet, 1 Tablet, Oral, Daily  mupirocin (BACTROBAN) 2% topical ointment, , Apply Topically, 2x/day  nitroGLYCERIN (NITROSTAT) sublingual tablet, 0.4 mg, Sublingual, Q5 Min PRN  omega-3 fatty acids (LOVAZA) capsule, 1 g, Oral, Daily  ondansetron (ZOFRAN) 2 mg/mL injection, 4 mg, Intravenous, Q4H PRN  oxyCODONE (ROXICODONE) immediate release tablet, 5 mg, Oral, Q4H PRN  oxyCODONE (ROXICODONE) immediate release tablet, 10 mg, Oral, Q4H PRN  oxyCODONE-acetaminophen (PERCOCET) 5-325mg  per tablet,  1 Tablet, Oral, Q6H PRN  pantoprazole (PROTONIX) delayed release tablet, 40 mg, Oral, Daily  sertraline (ZOLOFT) tablet, 100 mg, Oral, Daily  traZODone (DESYREL) tablet, 150 mg, Oral, NIGHTLY        Physical Exam:    GENERAL:  Awake and alert up in chair in no distress  SKIN: Warm and dry.  Midline chest incision which chest is in place  HEAD: Atraumatic, normocephalic.  EYES: Pupils equal and reactive. EOMI.  EARS AND NOSE: Normal to examination.  THROAT: No exudate, no pharyngeal erythema noted.  NECK: Supple, Trachea midline, no lymphadenopathy.  CARDIAC: S1 and S2, no murmurs or gallops. No S3.  LUNGS: Clear to auscultation bilaterally. No wheezes or rhonchi. No accessory muscle use noted.  GI: Abdomen soft, nontender. Good bowel sounds x 4. No organomegaly appreciated.  GU: No suprapubic tenderness. No costovertebral tenderness.  MUSCULOSKELETAL: Good muscle strength throughout.  EXTREMITIES: No pedal edema noted. Distal pulses equal bilaterally.  NEUROLOGICAL: Cranial nerves grossly intact. No gross focal deficit.  PSYCHIATRIC: Appropriate mood and affect.     Labs:     Results for orders placed or performed during the hospital encounter of 01/28/23 (from the past 24 hour(s))   BASIC METABOLIC PANEL, NON-FASTING   Result Value Ref Range    SODIUM 144 133 - 144 mmol/L    POTASSIUM 3.8 3.2 - 5.0 mmol/L    CHLORIDE 107 (H) 96 - 106 mmol/L    CO2 TOTAL 27 22 - 30 mmol/L    CALCIUM 8.9 8.3 - 10.7 mg/dL    GLUCOSE 782 (H) 74 - 109 mg/dL    BUN 11 8 - 23 mg/dL    CREATININE 9.56 2.13 - 0.90 mg/dL    BUN/CREA RATIO 17     ESTIMATED GFR >90 >90 mL/min/1.32m^2   IONIZED CALCIUM WITH PH   Result Value Ref Range    PH (VENOUS) 7.47 (H) 7.32 - 7.43    IONIZED CALCIUM 1.17 1.15 - 1.33 mmol/L   MAGNESIUM   Result Value Ref Range    MAGNESIUM 1.7 1.6 - 2.4 mg/dL   CBC WITH DIFF   Result Value Ref Range    WBC 10.2 3.7 - 11.0 x10^3/uL    RBC 2.44 (L) 3.85 - 5.22 x10^6/uL    HGB 7.0 (L) 11.5 - 16.0 g/dL    HCT 08.6 (L) 57.8 -  46.0 %    MCV 91.4 78.0 - 100.0 fL    MCH 28.7 26.0 - 32.0 pg    MCHC 31.4 31.0 - 35.5 g/dL    RDW-CV 46.9 62.9 - 52.8 %    PLATELETS 107 (L) 150 - 400 x10^3/uL    MPV 10.6 8.7 - 12.5 fL    NEUTROPHIL % 81.0 %    LYMPHOCYTE % 10.0 %  MONOCYTE % 7.7 %    EOSINOPHIL % 0.5 %    BASOPHIL % 0.2 %    NEUTROPHIL # 8.28 (H) 1.50 - 7.70 x10^3/uL    LYMPHOCYTE # 1.02 1.00 - 4.80 x10^3/uL    MONOCYTE # 0.79 0.20 - 1.10 x10^3/uL    EOSINOPHIL # <0.10 <=0.50 x10^3/uL    BASOPHIL # <0.10 <=0.20 x10^3/uL    IMMATURE GRANULOCYTE % 0.6 0.0 - 1.0 %    IMMATURE GRANULOCYTE # <0.10 <0.10 x10^3/uL   POC BLOOD GLUCOSE (RESULTS)   Result Value Ref Range    GLUCOSE, POC 157 (H) 70 - 100 mg/dl      Radiology:      Results for orders placed or performed during the hospital encounter of 01/28/23 (from the past 2160 hour(s))   XR AP MOBILE CHEST     Status: None    Narrative    Layton JEAN Vanwinkle    XR AP MOBILE CHEST performed on 02/08/2023 4:51 AM.    INDICATION:  POST-CABG   Additional History:  post CABG    TECHNIQUE:  Single portable frontal view of the chest.  1 views/1 images submitted for interpretation.    COMPARISON: 28 January 2023  ___________________________________  FINDINGS:    New postoperative changes including sternotomy. Central catheter line terminates in the. Multiple indwelling chest tubes. No visible pneumothorax or pneumomediastinum. There is opacity at the left lung base partially obscuring the left hemidiaphragm which likely represents atelectasis.      Impression    New postoperative changes. Opacity at the left base which is likely atelectasis.        Radiologist location ID: [id number]          Assessment & Plan:  Active Hospital Problems    Diagnosis    Primary Problem: Chest pain    S/P CABG x 1    Essential hypertension    Hyperlipidemia      Patient seen and examined, chart reviewed  Patient recently found to have severe in stent restenosis of lesions in the LAD and is now status post coronary artery bypass graft  x1  Currently on 2 L nasal cannula, wean as tolerated for target goal sat 90% or greater   CT management per CTS   Plan to remove tubes and wires today  Adequate pain control  Encourage ambulation  Push pulmonary hygiene and activity as tolerated  Did review preop PFT with moderate obstruction and FEV1 57% predicted  Nebs PRN  Progressing well    This case was discussed with the pulmonary attending who will add any further change in his addendum to this note.        Wynelle Bourgeois, APRN-FNP-BC    This note may have been partially generated using [employer] Fluency Direct system, and there may be some incorrect words, spellings, and punctuation that were not noted in checking the note before saving.    I personally saw and evaluated the patient as part of a shared service with an APP.    My substantive findings are:  MDM (complete) greater than 50% of medical decision-making by me personally.  I have addressed 1 or more acute illness requiring hospitalization and or causing organ dysfunction including:  Status post coronary artery bypass graft, hypertension hyperlipidemia patient clinically is doing great at this time all lines and chest tubes have been removed.      Continue to wean FiO2 to keep sat greater 90.      We will  need to assess for home O2 needs prior to discharge.      Otherwise patient will likely be able to go home.    Dawna Part III, DO  02/08/2023 16:36       (

## 2023-02-08 NOTE — Nurses Notes (Signed)
Pt ambulated 210ft x1 assist, on tele & 2L N/C. Pt assisted to toilet. Assisted back to bed. Pt reports SOB, spo2 90%, increased O2 to 3L N/C. Returned to baseline, no distress with spo2 94% within 2 min. Encouraged cough & deep breath. Call light in reach. States no further needs at this time. Will continue to monitor.

## 2023-02-08 NOTE — Care Plan (Signed)
[facility]   Cardiac Rehab Phase 1  [address]  [place], [place] [address]      Cardiac Rehab Phase 1 Daily Note    [name]   Nov 18, 1955   Height: 157.5 cm (5' 2.01")  Weight: 71.2 kg (156 lb 15.5 oz)   25/A   Payor: [employer] MEDICARE / Plan: [employer] CHOICE PPO / Product Type: PPO /        Cardiac Rehab Orders Received and Chart Reviewed:      Cardiac Dx:  CABG    Date of Event/Surgery: 02/07/23    Risk Factors:      Pre HR   Pre BP  Pre O2    Post HR  Post BP  Post O2    Activity Performed:  Pt reports she has ambulated 4x today and has just ambulated 10 minutes prior to me coming in.      Assessment:       Education Provided:     HEP given:     Comments: Discussed the plan of care and reinforced breathing exercises.  She participated in those with me.        Time Spent    Cardiac Rehab Phase 2 Referral Location:     Exclusion from Cardiac Rehab:  15    Vilma Prader, EP-C

## 2023-02-08 NOTE — Care Plan (Signed)
Problem: Adult Inpatient Plan of Care  Goal: Plan of Care Review  Outcome: Ongoing (see interventions/notes)  Goal: Patient-Specific Goal (Individualized)  Outcome: Ongoing (see interventions/notes)  Goal: Absence of Hospital-Acquired Illness or Injury  Outcome: Ongoing (see interventions/notes)  Intervention: Identify and Manage Fall Risk  Recent Flowsheet Documentation  Taken 02/08/2023 2000 by Edwin Cap, RN  Safety Promotion/Fall Prevention: safety round/check completed  Intervention: Prevent Skin Injury  Recent Flowsheet Documentation  Taken 02/08/2023 2000 by Edwin Cap, RN  Body Position: sitting  Skin Protection:   adhesive use limited   incontinence pads utilized  Intervention: Prevent and Manage VTE (Venous Thromboembolism) Risk  Recent Flowsheet Documentation  Taken 02/08/2023 2000 by Edwin Cap, RN  VTE Prevention/Management:   ambulation promoted   sequential compression devices on   compression stockings on  Intervention: Prevent Infection  Recent Flowsheet Documentation  Taken 02/08/2023 2000 by Edwin Cap, RN  Infection Prevention:   personal protective equipment utilized   single patient room provided  Goal: Optimal Comfort and Wellbeing  Outcome: Ongoing (see interventions/notes)  Intervention: Provide Person-Centered Care  Recent Flowsheet Documentation  Taken 02/08/2023 2000 by Edwin Cap, RN  Trust Relationship/Rapport:   care explained   choices provided   questions answered   questions encouraged   reassurance provided   thoughts/feelings acknowledged  Goal: Rounds/Family Conference  Outcome: Ongoing (see interventions/notes)     Problem: Fall Injury Risk  Goal: Absence of Fall and Fall-Related Injury  Outcome: Ongoing (see interventions/notes)  Intervention: Identify and Manage Contributors  Recent Flowsheet Documentation  Taken 02/08/2023 2000 by Edwin Cap, RN  Self-Care Promotion:   independence encouraged   BADL personal objects within reach  Intervention: Promote  Injury-Free Environment  Recent Flowsheet Documentation  Taken 02/08/2023 2000 by Edwin Cap, RN  Safety Promotion/Fall Prevention: safety round/check completed     Problem: Pain Acute  Goal: Optimal Pain Control and Function  Outcome: Ongoing (see interventions/notes)  Intervention: Prevent or Manage Pain  Recent Flowsheet Documentation  Taken 02/08/2023 2000 by Edwin Cap, RN  Sleep/Rest Enhancement:   family presence promoted   consistent schedule promoted   regular sleep/rest pattern promoted  Bowel Elimination Promotion: (bowel regimine started)   adequate fluid intake promoted   ambulation promoted   commode/bedpan at bedside   other (see comments)  Intervention: Optimize Psychosocial Wellbeing  Recent Flowsheet Documentation  Taken 02/08/2023 2000 by Edwin Cap, RN  Diversional Activities:   smartphone   television     Problem: Fall Injury Risk  Goal: Absence of Fall and Fall-Related Injury  Outcome: Ongoing (see interventions/notes)  Intervention: Identify and Manage Contributors  Recent Flowsheet Documentation  Taken 02/08/2023 2000 by Edwin Cap, RN  Self-Care Promotion:   independence encouraged   BADL personal objects within reach  Intervention: Promote Injury-Free Environment  Recent Flowsheet Documentation  Taken 02/08/2023 2000 by Edwin Cap, RN  Safety Promotion/Fall Prevention: safety round/check completed

## 2023-02-08 NOTE — Progress Notes (Signed)
[facility]  CARDIAC SURGERY FLOOR/STEPDOWN PROGRESS NOTE      [name], [name]  Date of Admission:  01/28/2023  Date of Birth:  1956/09/22    Hospital Day:  LOS: 11 days   Post-op Day:  POD # 1  , S/P  CABG  Date of Service:  02/08/2023    SUBJECTIVE:   OOB to chair this AM  No GTTS  JP 56ml/8 hours  Mediastinal drain 92ml/8 hours  Pleural 96ml/8 hours    OBJECTIVE:    Vital Signs:  Temp (24hrs) Max:37 C (98.6 F)      Temperature: 36.9 C (98.5 F) (02/08/23 0400)  BP (Non-Invasive): 115/70 (02/08/23 0800)  MAP (Non-Invasive): 85 mmHG (02/08/23 0800)  Heart Rate: 92 (02/08/23 0800)  Respiratory Rate: 20 (02/08/23 0800)  SpO2: 96 % (02/08/23 0800)    Base (Admission) Weight: Weight: 71.2 kg (157 lb)  Weight:  Weight: 71.2 kg (156 lb 15.5 oz)    Current Inpatient Medications:  acetaminophen (TYLENOL) tablet, 650 mg, Oral, Q4H PRN  anastrozole (ARIMIDEX) tablet, 1 mg, Oral, Daily  ascorbic acid (VITAMIN C) tablet, 500 mg, Oral, Daily  aspirin chewable tablet 81 mg, 81 mg, Oral, Daily  atorvastatin (LIPITOR) tablet, 40 mg, Oral, QPM  bisacodyl (DULCOLAX) rectal suppository, 10 mg, Rectal, Daily PRN  cholecalciferol (VITAMIN D3) 1000 unit (25 mcg) tablet, 1,000 Units, Oral, Daily  clonazePAM (klonoPIN) tablet, 0.5 mg, Oral, Q12H PRN  clopidogrel (PLAVIX) 75 mg tablet, 75 mg, Oral, Daily  Correction/SSIP insulin lispro (HumaLOG) 100 units/mL injection, 0-18 Units, Subcutaneous, 4x/day AC  dexmedeTOMIDine (PRECEDEX) 400 mcg in NS premix infusion, 0.3 mcg/kg/hr (Adjusted), Intravenous, Continuous  dextrose (GLUTOSE) 40% oral gel, 15 g, Oral, Q15 Min PRN  dextrose 50% (0.5 g/mL) injection - syringe, 12.5 g, Intravenous, Q15 Min PRN  electrolyte-A (PLASMALYTE-A) premix infusion, , Intravenous, Continuous  EPINEPHrine (ADRENALIN) 4 mg in NS 250 mL infusion, 0.02 mcg/kg/min, Intravenous, Continuous  famotidine (PEPCID) 20 mg in iso-osmotic 50 mL premix IVPB, 20 mg, Intravenous, Q12H  fluticasone (FLONASE)  50 mcg per spray nasal spray, 1 Spray, Each Nostril, Once PRN  glucagon (GLUCAGEN) injection 1 mg, 1 mg, IntraMUSCULAR, Once PRN  HYDROmorphone (DILAUDID) 0.5 mg/0.5 mL injection, 0.2 mg, Intravenous, Q4H PRN  ipratropium (ATROVENT) 0.02% nebulizer solution, 0.5 mg, Nebulization, Q4H PRN  levalbuterol (XOPENEX) 1.25 mg/ 3 mL nebulizer solution, 1.25 mg, Nebulization, Q4H PRN  magnesium hydroxide (MILK OF MAGNESIA) 400mg  per 5mL oral liquid, 30 mL, Oral, Daily PRN  metoprolol tartrate (LOPRESSOR) tablet, 12.5 mg, Oral, 2x/day  multivitamin (THERA) tablet, 1 Tablet, Oral, Daily  mupirocin (BACTROBAN) 2% topical ointment, , Apply Topically, 2x/day  nitroGLYCERIN (NITROSTAT) sublingual tablet, 0.4 mg, Sublingual, Q5 Min PRN  nitroGLYCERIN 50 mg in 250 ml D5W premix infusion, 5 mcg/min, Intravenous, Continuous  norepinephrine (LEVOPHED) 4 mg in D5W 250 mL premix infusion (16 mcg/mL), 0.03 mcg/kg/min, Intravenous, Continuous  NS premix infusion, , Intravenous, Continuous  omega-3 fatty acids (LOVAZA) capsule, 1 g, Oral, Daily  ondansetron (ZOFRAN) 2 mg/mL injection, 4 mg, Intravenous, Q4H PRN  oxyCODONE (ROXICODONE) immediate release tablet, 5 mg, Oral, Q4H PRN  oxyCODONE (ROXICODONE) immediate release tablet, 10 mg, Oral, Q4H PRN  oxyCODONE-acetaminophen (PERCOCET) 5-325mg  per tablet, 1 Tablet, Oral, Q6H PRN  pantoprazole (PROTONIX) delayed release tablet, 40 mg, Oral, Daily  sertraline (ZOLOFT) tablet, 100 mg, Oral, Daily  traZODone (DESYREL) tablet, 150 mg, Oral, NIGHTLY          I/O:  I/O last 24 hours  to current time:    Intake/Output Summary (Last 24 hours) at 02/08/2023 0847  Last data filed at 02/08/2023 0800  Gross per 24 hour   Intake 150 ml   Output 600 ml   Net -450 ml     I/O last 3 completed shifts:  In: 150   Out: 600 [Urine:490; Drains:10; Chest Tube:100]      Nutrition/Diet:  DIET CARDIAC (2GM NA, LOW FAT, LOWCHOL) Additional modifications/limitations: 2 G SODIUM      Labs:  Reviewed    Radiology:     Reviewed    Physical Exam:    Constitutional:  no distress  Cardiovascular:  regular rate and rhythm  Abd:  Soft, non-tender  Ext: no calf tenderness    ASSESSMENT/PLAN:  DC TPW with precautions  DC chest tubes  Continue JP drain  Progress with ambulation  Monitor post operative blood loss anemia 7/22.3  Transfer to step down            Karma Ganja, FNP-BC 02/08/2023, 08:47

## 2023-02-08 NOTE — Consults (Signed)
ENDOCRINOLOGY PROGRESS NOTE      Name: [name]  Age: 67 y.o.  MRN: [id number]    Reason for Consult: Prediabetes    Subjective:     [name] without complaints.  She has spoken with dietician and has no questions.  She is eating well.  Glucoses 139 this am.  She is s/p CABG x1.  I changed her diet to 1800 calorie ADA diet.     Review of Systems:   Negative except for what has been mentioned in subjective.    Objective :  BP 124/69   Pulse 93   Temp 37.1 C (98.7 F)   Resp (!) 25   Ht 1.575 m (5' 2.01")   Wt 71.2 kg (156 lb 15.5 oz)   SpO2 97%   BMI 28.70 kg/m     Lungs Clear  Heart RRR  Abdomen soft non tender No masses  Ext she has PCD in place bilaterally      Labs and Imaging:   (I have independently reviewed all pertinent labs and or ima      Recent Labs     02/08/23  0553 02/08/23  1625   GLUIP 157* 131*         Assessment/Plan:  Prediabetes  S/p CABG          Independently reviewed labs        Plan:  Continue 1800 calorie ADA diet  Continue to monitor glucoses and consider starting Metformin or Jardiance          I have independently reviewed all pertinent labs and or imaging  Discussed above plan of care with patient.     Anastasio Champion, MD

## 2023-02-08 NOTE — Nurses Notes (Signed)
Pt ambulated 242ft x1 assist, on tele & 2L N/C. No distress noted. Pt assisted to toilet. Assisted back to chair. Tol well. Call light in reach. States no further needs at this time.

## 2023-02-08 NOTE — Nurses Notes (Signed)
Pt ambulated 164ft x1 assist, on tele & 3L N/C. No distress noted. No dump in CT's. Pt assisted to toilet, post foley removal void. Assisted back to bed. Tol well. Call light in reach. States no further needs at this time.

## 2023-02-08 NOTE — Care Management Notes (Signed)
Social Services    Admission assessment completed prior to Epic.     Home health referral was made to [facility].

## 2023-02-08 NOTE — Progress Notes (Addendum)
[facility]    Cardiology Inpatient Progress Note    [name], [name], 67 y.o. female  Inpatient Admission Date: 01/28/2023   Date of Service: 02/07/2023   Date of Birth:  October 25, 1955  PCP:  Lillia Mountain, PA-C    Subjective:  This is a 67 year old female known to Dr Kirk Ruths with previous PCI to LAD in 2019 with 3 DES to LAD.      She came to [facility] ER with chest pain.  She had a heart cath 01/31/23 with Dr Kirk Ruths revealing:   CORONARY ANATOMY  1.  Right coronary:  The right coronary artery is a small caliber vessel giving rise to PDA and posterolateral branches.  The right coronary artery, PDA, and posterolateral       branch have 20-30% diffuse disease.  2.  Left main coronary:  The left main bifurcates into the LAD and circumflex systems.  The left main has 0-5% diffuse disease.  3.  Left anterior descending coronary:  The LAD is a moderate size vessel system.  It has tandem mid-LAD in-stent restenosis.  Remainder of the vessel has 10-20% diffuse       disease.  4.  Circumflex coronary:  The circumflex gives rise to 3 OMs.  Circumflex proper, OM-1, OM-2, and OM-3 have 10-20% diffuse disease.  CONCLUSION  1.  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  2.  Mild circumflex and right coronary artery disease.  3.  Possible mild aortic stenosis on pullback gradient.    She had an echo that showed good EF with no significant valvular heart disease and grade 1 diastolic dysfunction.    Less than 50% bilateral carotids.      She is now post CABG and feeling ok.  Had some nausea and vomiting this morning was unable to keep breakfast down.  Sitting in chair.  Low grade temp and mild tachycardia.  Labs ok.      She is feeling better today.  Less nausea.  Ate a little breakfast.  No temp.  EKG looked ok yesterday.  Chest tubes being pulled today.      Inpatient Meds:  acetaminophen (TYLENOL) tablet, 650 mg, Oral, Q4H PRN  anastrozole (ARIMIDEX) tablet, 1 mg, Oral,  Daily  ascorbic acid (VITAMIN C) tablet, 500 mg, Oral, Daily  aspirin chewable tablet 81 mg, 81 mg, Oral, Daily  atorvastatin (LIPITOR) tablet, 40 mg, Oral, QPM  bisacodyl (DULCOLAX) rectal suppository, 10 mg, Rectal, Daily PRN  cholecalciferol (VITAMIN D3) 1000 unit (25 mcg) tablet, 1,000 Units, Oral, Daily  clonazePAM (klonoPIN) tablet, 0.5 mg, Oral, Q12H PRN  clopidogrel (PLAVIX) 75 mg tablet, 75 mg, Oral, Daily  Correction/SSIP insulin lispro (HumaLOG) 100 units/mL injection, 0-18 Units, Subcutaneous, 4x/day AC  dextrose (GLUTOSE) 40% oral gel, 15 g, Oral, Q15 Min PRN  dextrose 50% (0.5 g/mL) injection - syringe, 12.5 g, Intravenous, Q15 Min PRN  fluticasone (FLONASE) 50 mcg per spray nasal spray, 1 Spray, Each Nostril, Once PRN  glucagon (GLUCAGEN) injection 1 mg, 1 mg, IntraMUSCULAR, Once PRN  HYDROmorphone (DILAUDID) 0.5 mg/0.5 mL injection, 0.2 mg, Intravenous, Q4H PRN  ipratropium (ATROVENT) 0.02% nebulizer solution, 0.5 mg, Nebulization, Q4H PRN  levalbuterol (XOPENEX) 1.25 mg/ 3 mL nebulizer solution, 1.25 mg, Nebulization, Q4H PRN  magnesium hydroxide (MILK OF MAGNESIA) 400mg  per 5mL oral liquid, 30 mL, Oral, Daily PRN  metoprolol tartrate (LOPRESSOR) tablet, 12.5 mg, Oral, 2x/day  multivitamin (THERA) tablet, 1 Tablet, Oral, Daily  mupirocin (BACTROBAN) 2% topical  ointment, , Apply Topically, 2x/day  nitroGLYCERIN (NITROSTAT) sublingual tablet, 0.4 mg, Sublingual, Q5 Min PRN  omega-3 fatty acids (LOVAZA) capsule, 1 g, Oral, Daily  ondansetron (ZOFRAN) 2 mg/mL injection, 4 mg, Intravenous, Q4H PRN  oxyCODONE (ROXICODONE) immediate release tablet, 5 mg, Oral, Q4H PRN  oxyCODONE (ROXICODONE) immediate release tablet, 10 mg, Oral, Q4H PRN  oxyCODONE-acetaminophen (PERCOCET) 5-325mg  per tablet, 1 Tablet, Oral, Q6H PRN  pantoprazole (PROTONIX) delayed release tablet, 40 mg, Oral, Daily  sennosides-docusate sodium (SENOKOT-S) 8.6-50mg  per tablet, 1 Tablet, Oral, 2x/day  sertraline (ZOLOFT) tablet, 100 mg,  Oral, Daily  traZODone (DESYREL) tablet, 150 mg, Oral, NIGHTLY       No Known Allergies    Exam:  HEENT: PERLA  Neck No JVD or bruit  Lungs clear  Heart RRR no murmurs rubs or gallops appreciated  Abd N X 4  Ext no clubbing or cyanosis.  No edema  Neuro AAO x 3      Labs:    CBC Results Differential Results   Recent Labs     02/08/23  0341   WBC 10.2   HGB 7.0*   HCT 22.3*    Recent Labs     02/08/23  0341   PMNS 81.0   MONOCYTES 7.7   BASOPHILS 0.2  <0.10   PMNABS 8.28*   LYMPHSABS 1.02   MONOSABS 0.79   EOSABS <0.10      BMP Results Other Chemistries Results   Recent Labs     02/08/23  0341   SODIUM 144   POTASSIUM 3.8   CHLORIDE 107*   CO2 27   BUN 11   CREATININE 0.64   GFR >90    Recent Labs     02/08/23  0341   CALCIUM 8.9   MAGNESIUM 1.7      Liver/Pancreas Enzyme Results Blood Gas     No results for input(s): "TOTALPROTEIN", "ALBUMIN", "PREALBUMIN", "AST", "ALT", "ALKPHOS", "LDH", "AMYLASE", "LIPASE" in the last 72 hours.    Invalid input(s): "GGT" Recent Labs     02/08/23  0341   PH 7.47*      Cardiac Results    Coags Results     No results for input(s): "UHCEASTTROPI", "CKMB", "MBINDEX", "BNP" in the last 72 hours. No results for input(s): "INR" in the last 72 hours.    Invalid input(s): "PTT", "PT"       Imaging Studies:     Last Echo   01/31/23 EF 55-60% with grade 1 diastolic dysfunction and no significant valve disease.   LEFT HEART CATH  01/31/23  CORONARY ANATOMY  1.  Right coronary:  The right coronary artery is a small caliber vessel giving rise to PDA and posterolateral branches.  The right coronary artery, PDA, and posterolateral       branch have 20-30% diffuse disease.  2.  Left main coronary:  The left main bifurcates into the LAD and circumflex systems.  The left main has 0-5% diffuse disease.  3.  Left anterior descending coronary:  The LAD is a moderate size vessel system.  It has tandem mid-LAD in-stent restenosis.  Remainder of the vessel has 10-20% diffuse       disease.  4.  Circumflex  coronary:  The circumflex gives rise to 3 OMs.  Circumflex proper, OM-1, OM-2, and OM-3 have 10-20% diffuse disease.    CONCLUSION  1.  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  2.  Mild circumflex and right coronary artery disease.  3.  Possible mild aortic stenosis on pullback gradient.  4.  Tobacco addiction/abuse, which was discussed at length the need to stop.    STRESS TEST/MYOCARDIAL PERFUSION IMAGING  01/31/23 abnormal with moderate area of ischemia in the septal segment of medium size.  There is a moderate area of ischemia in the apical segment ov very small size.  There is a moderate of ischemia in the inferobasal segment of very small size.  There is evidence of small infarct noted in the septal, anteroseptal segment.  There is evidence of very small infarct noted in the apical inferoseptal segment.  EF 68%.          Assessment/Plan:  Active Hospital Problems    Diagnosis    Primary Problem: Chest pain    S/P CABG x 1    Essential hypertension    Hyperlipidemia     Plan:  Doing well s/p CABG.  Continue present treatment.        Roxan Diesel, APRN,FNP-BC    I have seen, examined and discussed patients history and current issues.  I agree with the charted information and examination by Roxan Diesel APRN-BC.  I have performed my own physical examination which is as follows:   AAO x 3  Heart RRR   Lungs clear  Abd N x 4, negative masses, nontender  Ext no edema  Femoral arteries are +1 bilateral and equal   No gross focal neurovascular deficits noted  DP/PT and  radials +1 bilaterally equal  More than 50% of the billable services performed this visit were performed by me.  This includes the time spent reviewing chart, my nurse practitioners note and briefing, independent review of patients available labs and imaging, time spent in face to face encounter with patient and discussing test results.

## 2023-02-08 NOTE — Nurses Notes (Signed)
Pt ambulated 280ft, x2 assist, on tele and 4L N/C. VSS. Assisted back to chair. No distress noted. Tol well. Call light in reach

## 2023-02-08 NOTE — Nurses Notes (Signed)
Pt. Ambulated approx. 200 feet with tele. And 02 at 4 lpm/nc.Marland Kitchen pt. Tolerated process without incident.Marland Kitchenpt. was returned to room and bedside chair. Will continue to monitor and assess pt for needs.

## 2023-02-08 NOTE — Care Plan (Signed)
Problem: Adult Inpatient Plan of Care  Goal: Plan of Care Review  02/08/2023 1405 by Adair Laundry, RN  Outcome: Ongoing (see interventions/notes)  02/08/2023 1404 by Adair Laundry, RN  Outcome: Ongoing (see interventions/notes)  Flowsheets (Taken 02/08/2023 1404)  Progress: improving  Plan of Care Reviewed With: patient  Goal: Patient-Specific Goal (Individualized)  02/08/2023 1405 by Adair Laundry, RN  Outcome: Ongoing (see interventions/notes)  02/08/2023 1404 by Adair Laundry, RN  Outcome: Ongoing (see interventions/notes)  Flowsheets (Taken 02/08/2023 1404)  Anxieties, Fears or Concerns: Decrease pain  Plan of Care Reviewed With: patient  Goal: Absence of Hospital-Acquired Illness or Injury  02/08/2023 1405 by Adair Laundry, RN  Outcome: Ongoing (see interventions/notes)  02/08/2023 1404 by Adair Laundry, RN  Outcome: Ongoing (see interventions/notes)  Intervention: Identify and Manage Fall Risk  Recent Flowsheet Documentation  Taken 02/08/2023 1200 by Adair Laundry, RN  Safety Promotion/Fall Prevention:   activity supervised   fall prevention program maintained   nonskid shoes/slippers when out of bed   safety round/check completed   toileting scheduled  Taken 02/08/2023 0800 by Adair Laundry, RN  Safety Promotion/Fall Prevention:   activity supervised   fall prevention program maintained   nonskid shoes/slippers when out of bed   safety round/check completed   toileting scheduled  Intervention: Prevent Skin Injury  Recent Flowsheet Documentation  Taken 02/08/2023 1300 by Adair Laundry, RN  Body Position: sitting  Taken 02/08/2023 1000 by Adair Laundry, RN  Body Position: supine  Taken 02/08/2023 0800 by Adair Laundry, RN  Body Position: sitting  Skin Protection:   adhesive use limited   electrode sites changed   pulse oximeter probe site changed   skin sealant/moisture barrier applied   tubing/devices free from skin contact  Intervention: Prevent and Manage VTE (Venous Thromboembolism)  Risk  Recent Flowsheet Documentation  Taken 02/08/2023 1200 by Adair Laundry, RN  VTE Prevention/Management:   ambulation promoted   compression stockings on   sequential compression devices on  Taken 02/08/2023 0800 by Adair Laundry, RN  VTE Prevention/Management:   ambulation promoted   compression stockings on   sequential compression devices on  Intervention: Prevent Infection  Recent Flowsheet Documentation  Taken 02/08/2023 1200 by Adair Laundry, RN  Infection Prevention:   personal protective equipment utilized   rest/sleep promoted   promote handwashing  Goal: Optimal Comfort and Wellbeing  02/08/2023 1405 by Adair Laundry, RN  Outcome: Ongoing (see interventions/notes)  02/08/2023 1404 by Adair Laundry, RN  Outcome: Ongoing (see interventions/notes)  Intervention: Provide Person-Centered Care  Recent Flowsheet Documentation  Taken 02/08/2023 1200 by Adair Laundry, RN  Trust Relationship/Rapport:   care explained   choices provided   emotional support provided   empathic listening provided   questions answered   questions encouraged   reassurance provided   thoughts/feelings acknowledged  Taken 02/08/2023 0800 by Adair Laundry, RN  Trust Relationship/Rapport:   care explained   choices provided   emotional support provided   empathic listening provided   questions answered   questions encouraged   reassurance provided   thoughts/feelings acknowledged  Goal: Rounds/Family Conference  02/08/2023 1405 by Adair Laundry, RN  Outcome: Ongoing (see interventions/notes)  02/08/2023 1404 by Adair Laundry, RN  Outcome: Ongoing (see interventions/notes)     Problem: Fall Injury Risk  Goal: Absence of Fall and Fall-Related Injury  02/08/2023 1405 by Adair Laundry, RN  Outcome: Ongoing (see interventions/notes)  02/08/2023 1404 by Adair Laundry, RN  Outcome: Ongoing (see interventions/notes)  Intervention: Identify and Manage Contributors  Recent Flowsheet Documentation  Taken 02/08/2023 0800 by  Adair Laundry, RN  Self-Care Promotion:   independence encouraged   meal set-up provided   BADL personal objects within reach  Intervention: Promote Injury-Free Environment  Recent Flowsheet Documentation  Taken 02/08/2023 1200 by Adair Laundry, RN  Safety Promotion/Fall Prevention:   activity supervised   fall prevention program maintained   nonskid shoes/slippers when out of bed   safety round/check completed   toileting scheduled  Taken 02/08/2023 0800 by Adair Laundry, RN  Safety Promotion/Fall Prevention:   activity supervised   fall prevention program maintained   nonskid shoes/slippers when out of bed   safety round/check completed   toileting scheduled     Problem: Pain Acute  Goal: Optimal Pain Control and Function  02/08/2023 1405 by Adair Laundry, RN  Outcome: Ongoing (see interventions/notes)  02/08/2023 1404 by Adair Laundry, RN  Outcome: Ongoing (see interventions/notes)  Intervention: Prevent or Manage Pain  Recent Flowsheet Documentation  Taken 02/08/2023 1200 by Adair Laundry, RN  Sensory Stimulation Regulation:   care clustered   lighting decreased   quiet environment promoted   television on  Taken 02/08/2023 0800 by Adair Laundry, RN  Sensory Stimulation Regulation:   care clustered   lighting decreased   quiet environment promoted   television on  Sleep/Rest Enhancement:   noise level reduced   regular sleep/rest pattern promoted   relaxation techniques promoted   room darkened  Intervention: Optimize Psychosocial Wellbeing  Recent Flowsheet Documentation  Taken 02/08/2023 1200 by Adair Laundry, RN  Diversional Activities:   smartphone   television  Taken 02/08/2023 0800 by Adair Laundry, RN  Diversional Activities:   smartphone   television  Supportive Measures:   active listening utilized   decision-making supported   positive reinforcement provided   relaxation techniques promoted   self-care encouraged   verbalization of feelings encouraged     Problem: Fall Injury  Risk  Goal: Absence of Fall and Fall-Related Injury  02/08/2023 1405 by Adair Laundry, RN  Outcome: Ongoing (see interventions/notes)  02/08/2023 1404 by Adair Laundry, RN  Outcome: Ongoing (see interventions/notes)  Intervention: Identify and Manage Contributors  Recent Flowsheet Documentation  Taken 02/08/2023 0800 by Adair Laundry, RN  Self-Care Promotion:   independence encouraged   meal set-up provided   BADL personal objects within reach  Intervention: Promote Injury-Free Environment  Recent Flowsheet Documentation  Taken 02/08/2023 1200 by Adair Laundry, RN  Safety Promotion/Fall Prevention:   activity supervised   fall prevention program maintained   nonskid shoes/slippers when out of bed   safety round/check completed   toileting scheduled  Taken 02/08/2023 0800 by Adair Laundry, RN  Safety Promotion/Fall Prevention:   activity supervised   fall prevention program maintained   nonskid shoes/slippers when out of bed   safety round/check completed   toileting scheduled

## 2023-02-09 ENCOUNTER — Ambulatory Visit (HOSPITAL_COMMUNITY): Payer: Commercial Managed Care - PPO

## 2023-02-09 ENCOUNTER — Inpatient Hospital Stay (HOSPITAL_COMMUNITY): Payer: Commercial Managed Care - PPO

## 2023-02-09 ENCOUNTER — Ambulatory Visit (HOSPITAL_BASED_OUTPATIENT_CLINIC_OR_DEPARTMENT_OTHER): Payer: Medicare HMO | Admitting: Anesthesiology

## 2023-02-09 ENCOUNTER — Encounter (HOSPITAL_BASED_OUTPATIENT_CLINIC_OR_DEPARTMENT_OTHER)
Admission: RE | Disposition: A | Discharge status: Home / Self Care | Payer: Self-pay | Source: Ambulatory Visit | Attending: Plastic Surgery

## 2023-02-09 ENCOUNTER — Encounter (HOSPITAL_BASED_OUTPATIENT_CLINIC_OR_DEPARTMENT_OTHER): Payer: Self-pay | Admitting: Plastic Surgery

## 2023-02-09 ENCOUNTER — Other Ambulatory Visit: Payer: Self-pay

## 2023-02-09 ENCOUNTER — Ambulatory Visit (HOSPITAL_BASED_OUTPATIENT_CLINIC_OR_DEPARTMENT_OTHER)
Admission: RE | Admit: 2023-02-09 | Discharge: 2023-02-09 | Disposition: A | Discharge status: Home / Self Care | Payer: Medicare HMO | Source: Ambulatory Visit | Attending: Plastic Surgery | Admitting: Plastic Surgery

## 2023-02-09 DIAGNOSIS — N6082 Other benign mammary dysplasias of left breast: Secondary | ICD-10-CM | POA: Diagnosis not present

## 2023-02-09 DIAGNOSIS — Z6830 Body mass index (BMI) 30.0-30.9, adult: Secondary | ICD-10-CM | POA: Diagnosis not present

## 2023-02-09 DIAGNOSIS — Z803 Family history of malignant neoplasm of breast: Secondary | ICD-10-CM | POA: Diagnosis not present

## 2023-02-09 DIAGNOSIS — Z923 Personal history of irradiation: Secondary | ICD-10-CM | POA: Diagnosis not present

## 2023-02-09 DIAGNOSIS — Z8052 Family history of malignant neoplasm of bladder: Secondary | ICD-10-CM | POA: Diagnosis not present

## 2023-02-09 DIAGNOSIS — Z808 Family history of malignant neoplasm of other organs or systems: Secondary | ICD-10-CM | POA: Insufficient documentation

## 2023-02-09 DIAGNOSIS — C50211 Malignant neoplasm of upper-inner quadrant of right female breast: Secondary | ICD-10-CM | POA: Diagnosis not present

## 2023-02-09 DIAGNOSIS — Z853 Personal history of malignant neoplasm of breast: Secondary | ICD-10-CM | POA: Insufficient documentation

## 2023-02-09 DIAGNOSIS — N6022 Fibroadenosis of left breast: Secondary | ICD-10-CM | POA: Insufficient documentation

## 2023-02-09 DIAGNOSIS — N62 Hypertrophy of breast: Secondary | ICD-10-CM | POA: Insufficient documentation

## 2023-02-09 DIAGNOSIS — E669 Obesity, unspecified: Secondary | ICD-10-CM | POA: Diagnosis not present

## 2023-02-09 DIAGNOSIS — C50911 Malignant neoplasm of unspecified site of right female breast: Secondary | ICD-10-CM

## 2023-02-09 DIAGNOSIS — N6489 Other specified disorders of breast: Secondary | ICD-10-CM | POA: Diagnosis not present

## 2023-02-09 DIAGNOSIS — Z421 Encounter for breast reconstruction following mastectomy: Secondary | ICD-10-CM | POA: Diagnosis present

## 2023-02-09 DIAGNOSIS — Z9011 Acquired absence of right breast and nipple: Secondary | ICD-10-CM | POA: Diagnosis not present

## 2023-02-09 DIAGNOSIS — M542 Cervicalgia: Secondary | ICD-10-CM | POA: Diagnosis not present

## 2023-02-09 DIAGNOSIS — I10 Essential (primary) hypertension: Secondary | ICD-10-CM

## 2023-02-09 DIAGNOSIS — E785 Hyperlipidemia, unspecified: Secondary | ICD-10-CM

## 2023-02-09 DIAGNOSIS — I959 Hypotension, unspecified: Secondary | ICD-10-CM

## 2023-02-09 DIAGNOSIS — R079 Chest pain, unspecified: Secondary | ICD-10-CM

## 2023-02-09 HISTORY — PX: BREAST REDUCTION SURGERY: SHX8

## 2023-02-09 LAB — CBC
HCT: 23.4 % — ABNORMAL LOW (ref 34.8–46.0)
HGB: 7.1 g/dL — ABNORMAL LOW (ref 11.5–16.0)
MCH: 29.1 pg (ref 26.0–32.0)
MCHC: 30.3 g/dL — ABNORMAL LOW (ref 31.0–35.5)
MCV: 95.9 fL (ref 78.0–100.0)
MPV: 10.5 fL (ref 8.7–12.5)
PLATELETS: 132 10*3/uL — ABNORMAL LOW (ref 150–400)
RBC: 2.44 10*6/uL — ABNORMAL LOW (ref 3.85–5.22)
RDW-CV: 14 % (ref 11.5–15.5)
WBC: 9.1 10*3/uL (ref 3.7–11.0)

## 2023-02-09 LAB — H & H
HCT: 21.8 % — ABNORMAL LOW (ref 34.8–46.0)
HGB: 6.7 g/dL — CL (ref 11.5–16.0)

## 2023-02-09 LAB — BASIC METABOLIC PANEL
BUN/CREA RATIO: 22
BUN: 13 mg/dL (ref 8–23)
CALCIUM: 9 mg/dL (ref 8.3–10.7)
CHLORIDE: 107 mmol/L — ABNORMAL HIGH (ref 96–106)
CO2 TOTAL: 32 mmol/L — ABNORMAL HIGH (ref 22–30)
CREATININE: 0.59 mg/dL (ref 0.50–0.90)
ESTIMATED GFR: >90 mL/min/{1.73_m2} (ref >90)
GLUCOSE: 118 mg/dL — ABNORMAL HIGH (ref 74–109)
POTASSIUM: 4.2 mmol/L (ref 3.2–5.0)
SODIUM: 145 mmol/L — ABNORMAL HIGH (ref 133–144)

## 2023-02-09 LAB — COMPREHENSIVE METABOLIC PANEL, NON-FASTING
ALBUMIN: 3.1 g/dL — ABNORMAL LOW (ref 3.5–5.2)
ALKALINE PHOSPHATASE: 73 U/L (ref 35–129)
ALT (SGPT): 19 U/L (ref 0–33)
AST (SGOT): 23 U/L (ref 0–32)
BILIRUBIN TOTAL: 0.2 mg/dL (ref 0.2–1.2)
BUN: 15 mg/dL (ref 8–23)
CALCIUM: 8.7 mg/dL (ref 8.3–10.7)
CHLORIDE: 101 mmol/L (ref 96–106)
CO2 TOTAL: 30 mmol/L (ref 22–30)
CREATININE: 0.67 mg/dL (ref 0.50–0.90)
ESTIMATED GFR: >90 mL/min/{1.73_m2} (ref >90)
GLUCOSE: 206 mg/dL — ABNORMAL HIGH (ref 74–109)
POTASSIUM: 3.3 mmol/L (ref 3.2–5.0)
PROTEIN TOTAL: 5.4 g/dL — ABNORMAL LOW (ref 6.4–8.3)
SODIUM: 139 mmol/L (ref 133–144)

## 2023-02-09 LAB — PHOSPHORUS
PHOSPHORUS: 2.6 mg/dL (ref 2.5–4.5)
PHOSPHORUS: 3.2 mg/dL (ref 2.5–4.5)

## 2023-02-09 LAB — MAGNESIUM
MAGNESIUM: 1.8 mg/dL (ref 1.6–2.4)
MAGNESIUM: 1.4 mg/dL — ABNORMAL LOW (ref 1.6–2.4)

## 2023-02-09 LAB — POC BLOOD GLUCOSE (RESULTS)
GLUCOSE, POC: 125 mg/dl — ABNORMAL HIGH (ref 70–100)
GLUCOSE, POC: 133 mg/dl — ABNORMAL HIGH (ref 70–100)
GLUCOSE, POC: 128 mg/dl — ABNORMAL HIGH (ref 70–100)
GLUCOSE, POC: 151 mg/dl — ABNORMAL HIGH (ref 70–100)

## 2023-02-09 LAB — IONIZED CALCIUM WITH PH
IONIZED CALCIUM: 1.14 mmol/L — ABNORMAL LOW (ref 1.15–1.33)
PH (VENOUS): 7.54 — ABNORMAL HIGH (ref 7.32–7.43)

## 2023-02-09 SURGERY — BREAST REDUCTION WITH LIPOSUCTION
Anesthesia: General | Site: Breast | Laterality: Left

## 2023-02-09 MED ORDER — ACETAMINOPHEN 325 MG PO TABS: 650.0000 mg | ORAL_TABLET | ORAL | Status: DC | PRN

## 2023-02-09 MED ORDER — SODIUM CHLORIDE 0.9% FLUSH: 3.0000 mL | INTRAVENOUS | Status: DC | PRN

## 2023-02-09 MED ORDER — ACETAMINOPHEN 325 MG RE SUPP: 650.0000 mg | RECTAL | Status: DC | PRN

## 2023-02-09 MED ORDER — SODIUM CHLORIDE 0.9 % IV SOLN: 250.0000 mL | INTRAVENOUS | Status: DC | PRN

## 2023-02-09 MED ORDER — LACTATED RINGERS IV SOLN: INTRAVENOUS | Status: DC

## 2023-02-09 MED ORDER — CEFAZOLIN SODIUM-DEXTROSE 2-4 GM/100ML-% IV SOLN
2.0000 g | INTRAVENOUS | Status: AC
  Administered 2023-02-09: 2 g via INTRAVENOUS

## 2023-02-09 MED ORDER — BUPIVACAINE LIPOSOME 1.3 % IJ SUSP
INTRAMUSCULAR | Status: AC
  Filled 2023-02-09: qty 20

## 2023-02-09 MED ORDER — FENTANYL CITRATE (PF) 100 MCG/2ML IJ SOLN
INTRAMUSCULAR | Status: DC | PRN
  Administered 2023-02-09 (×2): 50 ug via INTRAVENOUS

## 2023-02-09 MED ORDER — CEFAZOLIN SODIUM-DEXTROSE 2-4 GM/100ML-% IV SOLN
INTRAVENOUS | Status: AC
  Filled 2023-02-09: qty 100

## 2023-02-09 MED ORDER — SODIUM CHLORIDE 0.9% FLUSH: 3.0000 mL | Freq: Two times a day (BID) | INTRAVENOUS | Status: DC

## 2023-02-09 MED ORDER — PROMETHAZINE HCL 25 MG/ML IJ SOLN: 6.2500 mg | INTRAMUSCULAR | Status: DC | PRN

## 2023-02-09 MED ORDER — OXYCODONE HCL 5 MG/5ML PO SOLN: 5.0000 mg | Freq: Once | ORAL | Status: AC | PRN

## 2023-02-09 MED ORDER — OXYCODONE HCL 5 MG PO TABS
5.0000 mg | ORAL_TABLET | Freq: Once | ORAL | Status: AC | PRN
  Administered 2023-02-09: 5 mg via ORAL

## 2023-02-09 MED ORDER — DEXAMETHASONE SODIUM PHOSPHATE 4 MG/ML IJ SOLN
INTRAMUSCULAR | Status: DC | PRN
  Administered 2023-02-09: 5 mg via INTRAVENOUS

## 2023-02-09 MED ORDER — EPHEDRINE 5 MG/ML INJ
INTRAVENOUS | Status: AC
  Filled 2023-02-09: qty 5

## 2023-02-09 MED ORDER — PHENYLEPHRINE 80 MCG/ML (10ML) SYRINGE FOR IV PUSH (FOR BLOOD PRESSURE SUPPORT)
PREFILLED_SYRINGE | INTRAVENOUS | Status: AC
  Filled 2023-02-09: qty 10

## 2023-02-09 MED ORDER — MIDAZOLAM HCL 5 MG/5ML IJ SOLN
INTRAMUSCULAR | Status: DC | PRN
  Administered 2023-02-09: 2 mg via INTRAVENOUS

## 2023-02-09 MED ORDER — AMISULPRIDE (ANTIEMETIC) 5 MG/2ML IV SOLN: 10.0000 mg | Freq: Once | INTRAVENOUS | Status: DC | PRN

## 2023-02-09 MED ORDER — LIDOCAINE HCL (CARDIAC) PF 100 MG/5ML IV SOSY
PREFILLED_SYRINGE | INTRAVENOUS | Status: DC | PRN
  Administered 2023-02-09: 40 mg via INTRAVENOUS

## 2023-02-09 MED ORDER — PROPOFOL 10 MG/ML IV BOLUS
INTRAVENOUS | Status: DC | PRN
  Administered 2023-02-09: 200 mg via INTRAVENOUS

## 2023-02-09 MED ORDER — HYDROMORPHONE HCL 1 MG/ML IJ SOLN: 0.2500 mg | INTRAMUSCULAR | Status: DC | PRN

## 2023-02-09 MED ORDER — MIDAZOLAM HCL 2 MG/2ML IJ SOLN
INTRAMUSCULAR | Status: AC
  Filled 2023-02-09: qty 2

## 2023-02-09 MED ORDER — OXYCODONE HCL 5 MG PO TABS: 5.0000 mg | ORAL_TABLET | ORAL | Status: DC | PRN

## 2023-02-09 MED ORDER — CHLORHEXIDINE GLUCONATE CLOTH 2 % EX PADS: 6.0000 | MEDICATED_PAD | Freq: Once | CUTANEOUS | Status: DC

## 2023-02-09 MED ORDER — MEPERIDINE HCL 25 MG/ML IJ SOLN: 6.2500 mg | INTRAMUSCULAR | Status: DC | PRN

## 2023-02-09 MED ORDER — SODIUM CHLORIDE (PF) 0.9 % IJ SOLN
INTRAMUSCULAR | Status: AC
  Filled 2023-02-09: qty 20

## 2023-02-09 MED ORDER — ATROPINE SULFATE 0.4 MG/ML IV SOLN
INTRAVENOUS | Status: AC
  Filled 2023-02-09: qty 1

## 2023-02-09 MED ORDER — LIDOCAINE-EPINEPHRINE 1 %-1:100000 IJ SOLN
INTRAMUSCULAR | Status: DC | PRN
  Administered 2023-02-09: 18 mL via INTRAMUSCULAR

## 2023-02-09 MED ORDER — ONDANSETRON HCL 4 MG/2ML IJ SOLN
INTRAMUSCULAR | Status: AC
  Filled 2023-02-09: qty 2

## 2023-02-09 MED ORDER — LIDOCAINE 2% (20 MG/ML) 5 ML SYRINGE
INTRAMUSCULAR | Status: AC
  Filled 2023-02-09: qty 5

## 2023-02-09 MED ORDER — LIDOCAINE HCL 1 % IJ SOLN
INTRAVENOUS | Status: DC | PRN
  Administered 2023-02-09: 350 mL

## 2023-02-09 MED ORDER — FENTANYL CITRATE (PF) 100 MCG/2ML IJ SOLN: 25.0000 ug | INTRAMUSCULAR | Status: DC | PRN

## 2023-02-09 MED ORDER — OXYCODONE HCL 5 MG PO TABS
ORAL_TABLET | ORAL | Status: AC
  Filled 2023-02-09: qty 1

## 2023-02-09 MED ORDER — FENTANYL CITRATE (PF) 100 MCG/2ML IJ SOLN
INTRAMUSCULAR | Status: AC
  Filled 2023-02-09: qty 2

## 2023-02-09 MED ORDER — SUCCINYLCHOLINE CHLORIDE 200 MG/10ML IV SOSY
PREFILLED_SYRINGE | INTRAVENOUS | Status: AC
  Filled 2023-02-09: qty 10

## 2023-02-09 MED ORDER — DEXAMETHASONE SODIUM PHOSPHATE 10 MG/ML IJ SOLN
INTRAMUSCULAR | Status: AC
  Filled 2023-02-09: qty 1

## 2023-02-09 MED ORDER — ONDANSETRON HCL 4 MG/2ML IJ SOLN
INTRAMUSCULAR | Status: DC | PRN
  Administered 2023-02-09: 4 mg via INTRAVENOUS

## 2023-02-09 MED ORDER — SODIUM CHLORIDE 0.9 % IV SOLN
INTRAVENOUS | Status: DC | PRN
  Administered 2023-02-09: 35 mL

## 2023-02-09 MED ORDER — AMIODARONE 360 MG/200 ML (1.8 MG/ML) IN DEXTROSE, ISO-OSMOTIC IV
INTRAVENOUS | Status: AC
Start: 2023-02-09 — End: 2023-02-10
  Filled 2023-02-09: qty 200

## 2023-02-09 MED ORDER — AMIODARONE 360 MG/200 ML (1.8 MG/ML) IN DEXTROSE, ISO-OSMOTIC IV
1.0000 mg/min | INTRAVENOUS | Status: DC
Start: 2023-02-09 — End: 2023-02-11
  Administered 2023-02-09: 1 mg/min via INTRAVENOUS
  Administered 2023-02-10 (×4): 0.5 mg/min via INTRAVENOUS
  Administered 2023-02-11: 0 mg/min via INTRAVENOUS
  Administered 2023-02-11 (×2): 0.5 mg/min via INTRAVENOUS
  Filled 2023-02-09 (×3): qty 200

## 2023-02-09 MED ORDER — FERROUS SULFATE 325 MG (65 MG IRON) TABLET,DELAYED RELEASE
325.0000 mg | DELAYED_RELEASE_TABLET | Freq: Every morning | ORAL | Status: DC
Start: 2023-02-09 — End: 2023-02-13
  Administered 2023-02-09 – 2023-02-13 (×5): 325 mg via ORAL
  Filled 2023-02-09 (×6): qty 1

## 2023-02-09 MED ORDER — POTASSIUM CHLORIDE ER 20 MEQ TABLET,EXTENDED RELEASE(PART/CRYST)
40.0000 meq | ORAL_TABLET | Freq: Once | ORAL | Status: AC
Start: 2023-02-10 — End: 2023-02-10
  Administered 2023-02-10: 40 meq via ORAL
  Filled 2023-02-09: qty 2

## 2023-02-09 MED ORDER — METOPROLOL TARTRATE 5 MG/5 ML INTRAVENOUS SOLUTION
INTRAVENOUS | Status: AC
Start: 2023-02-09 — End: 2023-02-10
  Filled 2023-02-09: qty 5

## 2023-02-09 MED ORDER — FUROSEMIDE 10 MG/ML INJECTION SOLUTION
20.0000 mg | Freq: Once | INTRAMUSCULAR | Status: AC
Start: 2023-02-09 — End: 2023-02-09
  Administered 2023-02-09: 20 mg via INTRAVENOUS
  Filled 2023-02-09: qty 2

## 2023-02-09 MED ORDER — SODIUM CHLORIDE 0.9 % IV BOLUS
40.0000 mL | INJECTION | Freq: Once | Status: AC | PRN
Start: 2023-02-09 — End: 2023-02-10

## 2023-02-09 MED ORDER — METOPROLOL TARTRATE 25 MG TABLET
25.0000 mg | ORAL_TABLET | Freq: Two times a day (BID) | ORAL | Status: DC
Start: 2023-02-09 — End: 2023-02-13
  Administered 2023-02-09 – 2023-02-13 (×8): 25 mg via ORAL
  Filled 2023-02-09 (×9): qty 1

## 2023-02-09 MED ORDER — METOPROLOL TARTRATE 5 MG/5 ML INTRAVENOUS SOLUTION
5.0000 mg | Freq: Once | INTRAVENOUS | Status: AC
Start: 2023-02-10 — End: 2023-02-10
  Administered 2023-02-10: 5 mg via INTRAVENOUS

## 2023-02-09 MED ORDER — MAGNESIUM SULFATE 2 GRAM/50 ML (4 %) IN WATER INTRAVENOUS PIGGYBACK
2.0000 g | INJECTION | Freq: Once | INTRAVENOUS | Status: AC
Start: 2023-02-10 — End: 2023-02-10
  Administered 2023-02-10: 2 g via INTRAVENOUS
  Administered 2023-02-10: 0 g via INTRAVENOUS
  Filled 2023-02-09: qty 50

## 2023-02-09 MED ORDER — AMIODARONE 150 MG/100 ML (1.5 MG/ML) IN DEXTROSE, ISO-OSMOTIC IV
150.0000 mg | Freq: Once | INTRAVENOUS | Status: AC
Start: 2023-02-09 — End: 2023-02-09
  Administered 2023-02-09: 150 mg via INTRAVENOUS

## 2023-02-09 MED ORDER — AMIODARONE 150 MG/100 ML (1.5 MG/ML) IN DEXTROSE, ISO-OSMOTIC IV
INTRAVENOUS | Status: AC
Start: 2023-02-09 — End: 2023-02-10
  Filled 2023-02-09: qty 100

## 2023-02-09 SURGICAL SUPPLY — 67 items
ADH SKN CLS APL DERMABOND .7 (GAUZE/BANDAGES/DRESSINGS) ×1
AGENT HMST PWDR BTL CLGN 5GM (Miscellaneous) ×1 IMPLANT
BAG DECANTER FOR FLEXI CONT (MISCELLANEOUS) ×2 IMPLANT
BINDER BREAST LRG (GAUZE/BANDAGES/DRESSINGS) IMPLANT
BINDER BREAST MEDIUM (GAUZE/BANDAGES/DRESSINGS) IMPLANT
BINDER BREAST XLRG (GAUZE/BANDAGES/DRESSINGS) IMPLANT
BINDER BREAST XXLRG (GAUZE/BANDAGES/DRESSINGS) IMPLANT
BIOPATCH RED 1 DISK 7.0 (GAUZE/BANDAGES/DRESSINGS) IMPLANT
BLADE HEX COATED 2.75 (ELECTRODE) ×2 IMPLANT
BLADE KNIFE PERSONA 10 (BLADE) ×4 IMPLANT
BLADE SURG 15 STRL LF DISP TIS (BLADE) IMPLANT
BLADE SURG 15 STRL SS (BLADE) ×1
CANISTER SUCT 1200ML W/VALVE (MISCELLANEOUS) ×2 IMPLANT
COLLAGEN CELLERATERX 5 GRAM (Miscellaneous) IMPLANT
COVER BACK TABLE 60X90IN (DRAPES) ×2 IMPLANT
COVER MAYO STAND STRL (DRAPES) ×2 IMPLANT
DERMABOND ADVANCED .7 DNX12 (GAUZE/BANDAGES/DRESSINGS) ×4 IMPLANT
DRAIN CHANNEL 19F RND (DRAIN) IMPLANT
DRAIN RELI 100 BL SUC LF ST (DRAIN)
DRAPE LAPAROSCOPIC ABDOMINAL (DRAPES) ×2 IMPLANT
DRSG OPSITE POSTOP 4X12 (GAUZE/BANDAGES/DRESSINGS) IMPLANT
DRSG OPSITE POSTOP 4X6 (GAUZE/BANDAGES/DRESSINGS) IMPLANT
ELECT BLADE 4.0 EZ CLEAN MEGAD (MISCELLANEOUS) ×1
ELECT REM PT RETURN 9FT ADLT (ELECTROSURGICAL) ×1
ELECTRODE BLDE 4.0 EZ CLN MEGD (MISCELLANEOUS) ×2 IMPLANT
ELECTRODE REM PT RTRN 9FT ADLT (ELECTROSURGICAL) ×2 IMPLANT
EVACUATOR SILICONE 100CC (DRAIN) IMPLANT
GAUZE PAD ABD 8X10 STRL (GAUZE/BANDAGES/DRESSINGS) ×4 IMPLANT
GAUZE SPONGE 4X4 12PLY STRL LF (GAUZE/BANDAGES/DRESSINGS) IMPLANT
GLOVE BIO SURGEON STRL SZ 6.5 (GLOVE) ×6 IMPLANT
GLOVE BIOGEL PI IND STRL 7.0 (GLOVE) ×2 IMPLANT
GOWN STRL REUS W/ TWL LRG LVL3 (GOWN DISPOSABLE) ×4 IMPLANT
GOWN STRL REUS W/TWL LRG LVL3 (GOWN DISPOSABLE) ×3
NDL FILTER BLUNT 18X1 1/2 (NEEDLE) ×2 IMPLANT
NDL HYPO 25X1 1.5 SAFETY (NEEDLE) ×2 IMPLANT
NDL SAFETY ECLIP 18X1.5 (MISCELLANEOUS) IMPLANT
NEEDLE FILTER BLUNT 18X1 1/2 (NEEDLE) ×1 IMPLANT
NEEDLE HYPO 25X1 1.5 SAFETY (NEEDLE) ×3 IMPLANT
NS IRRIG 1000ML POUR BTL (IV SOLUTION) IMPLANT
PACK BASIN DAY SURGERY FS (CUSTOM PROCEDURE TRAY) ×2 IMPLANT
PAD ALCOHOL SWAB (MISCELLANEOUS) IMPLANT
PAD FOAM SILICONE BACKED (GAUZE/BANDAGES/DRESSINGS) IMPLANT
PENCIL SMOKE EVACUATOR (MISCELLANEOUS) ×2 IMPLANT
PIN SAFETY STERILE (MISCELLANEOUS) IMPLANT
SLEEVE SCD COMPRESS KNEE MED (STOCKING) ×2 IMPLANT
SPIKE FLUID TRANSFER (MISCELLANEOUS) IMPLANT
SPONGE T-LAP 18X18 ~~LOC~~+RFID (SPONGE) ×4 IMPLANT
STRIP SUTURE WOUND CLOSURE 1/2 (MISCELLANEOUS) ×4 IMPLANT
SUT MNCRL AB 4-0 PS2 18 (SUTURE) ×8 IMPLANT
SUT MON AB 3-0 SH 27 (SUTURE) ×4
SUT MON AB 3-0 SH27 (SUTURE) ×8 IMPLANT
SUT MON AB 5-0 PS2 18 (SUTURE) IMPLANT
SUT PDS 3-0 CT2 (SUTURE) ×4
SUT PDS II 3-0 CT2 27 ABS (SUTURE) ×12 IMPLANT
SUT SILK 3 0 PS 1 (SUTURE) IMPLANT
SYR 3ML 23GX1 SAFETY (SYRINGE) IMPLANT
SYR 50ML LL SCALE MARK (SYRINGE) IMPLANT
SYR BULB IRRIG 60ML STRL (SYRINGE) ×2 IMPLANT
SYR CONTROL 10ML LL (SYRINGE) ×2 IMPLANT
TAPE MEASURE VINYL STERILE (MISCELLANEOUS) IMPLANT
TOWEL GREEN STERILE FF (TOWEL DISPOSABLE) ×4 IMPLANT
TRAY DSU PREP LF (CUSTOM PROCEDURE TRAY) ×2 IMPLANT
TUBE CONNECTING 20X1/4 (TUBING) ×2 IMPLANT
TUBING INFILTRATION IT-10001 (TUBING) IMPLANT
TUBING SET GRADUATE ASPIR 12FT (MISCELLANEOUS) IMPLANT
UNDERPAD 30X36 HEAVY ABSORB (UNDERPADS AND DIAPERS) ×4 IMPLANT
YANKAUER SUCT BULB TIP NO VENT (SUCTIONS) ×2 IMPLANT

## 2023-02-09 NOTE — Discharge Instructions (Addendum)
Post Anesthesia Home Care Instructions  Activity: Get plenty of rest for the remainder of the day. A responsible individual must stay with you for 24 hours following the procedure.  For the next 24 hours, DO NOT: -Drive a car -Advertising copywriter -Drink alcoholic beverages -Take any medication unless instructed by your physician -Make any legal decisions or sign important papers.  Meals: Start with liquid foods such as gelatin or soup. Progress to regular foods as tolerated. Avoid greasy, spicy, heavy foods. If nausea and/or vomiting occur, drink only clear liquids until the nausea and/or vomiting subsides. Call your physician if vomiting continues.  Special Instructions/Symptoms: Your throat may feel dry or sore from the anesthesia or the breathing tube placed in your throat during surgery. If this causes discomfort, gargle with warm salt water. The discomfort should disappear within 24 hours.  If you had a scopolamine patch placed behind your ear for the management of post- operative nausea and/or vomiting:  1. The medication in the patch is effective for 72 hours, after which it should be removed.  Wrap patch in a tissue and discard in the trash. Wash hands thoroughly with soap and water. 2. You may remove the patch earlier than 72 hours if you experience unpleasant side effects which may include dry mouth, dizziness or visual disturbances. 3. Avoid touching the patch. Wash your hands with soap and water after contact with the patch.       INSTRUCTIONS FOR AFTER BREAST SURGERY   You will likely have some questions about what to expect following your operation.  The following information will help you and your family understand what to expect when you are discharged from the hospital.  It is important to follow these guidelines to help ensure a smooth recovery and reduce complication.  Postoperative instructions include information on: diet, wound care, medications and physical  activity.  AFTER SURGERY Expect to go home after the procedure.  In some cases, you may need to spend one night in the hospital for observation.  DIET Breast surgery does not require a specific diet.  However, the healthier you eat the better your body will heal. It is important to increasing your protein intake.  This means limiting the foods with sugar and carbohydrates.  Focus on vegetables and some meat.  If you have liposuction during your procedure be sure to drink water.  If your urine is bright yellow, then it is concentrated, and you need to drink more water.  As a general rule after surgery, you should have 8 ounces of water every hour while awake.  If you find you are persistently nauseated or unable to take in liquids let us know.  NO TOBACCO USE or EXPOSURE.  This will slow your healing process and lead to a wound.  WOUND CARE Leave the binder on for 3 days . Use fragrance free soap like Dial, Dove or [place].   After 3 days you can remove the binder to shower. Once dry apply binder or sports bra. If you have liposuction you will have a soft and spongy dressing (Lipofoam) that helps prevent creases in your skin.  Remove before you shower and then replace it.  It is also available on [employer]. If you have steri-strips / tape directly attached to your skin leave them in place. It is OK to get these wet.   No baths, pools or hot tubs for four weeks. We close your incision to leave the smallest and best-looking scar. No ointment or creams  on your incisions for four weeks.  No Neosporin (Too many skin reactions).  A few weeks after surgery you can use Mederma and start massaging the scar. We ask you to wear your binder or sports bra for the first 6 weeks around the clock, including while sleeping. This provides added comfort and helps reduce the fluid accumulation at the surgery site. NO Ice or heating pads to the operative site.  You have a very high risk of a BURN before you feel the  temperature change.  ACTIVITY No heavy lifting until cleared by the doctor.  This usually means no more than a half-gallon of milk.  It is OK to walk and climb stairs. Moving your legs is very important to decrease your risk of a blood clot.  It will also help keep you from getting deconditioned.  Every 1 to 2 hours get up and walk for 5 minutes. This will help with a quicker recovery back to normal.  Let pain be your guide so you don't do too much.  This time is for you to recover.  You will be more comfortable if you sleep and rest with your head elevated either with a few pillows under you or in a recliner.  No stomach sleeping for a three months.  WORK Everyone returns to work at different times. As a rough guide, most people take at least 1 - 2 weeks off prior to returning to work. If you need documentation for your job, give the forms to the front staff at the clinic.  DRIVING Arrange for someone to bring you home from the hospital after your surgery.  You may be able to drive a few days after surgery but not while taking any narcotics or valium.  BOWEL MOVEMENTS Constipation can occur after anesthesia and while taking pain medication.  It is important to stay ahead for your comfort.  We recommend taking Milk of Magnesia (2 tablespoons; twice a day) while taking the pain pills.  MEDICATIONS You may be prescribed should start after surgery At your preoperative visit for you history and physical you may have been given the following medications: An antibiotic: Start this medication when you get home and take according to the instructions on the bottle. Zofran 4 mg:  This is to treat nausea and vomiting.  You can take this every 6 hours as needed and only if needed. Valium 2 mg for breast cancer patients: This is for muscle tightness if you have an implant or expander. This will help relax your muscle which also helps with pain control.  This can be taken every 12 hours as needed. Don't drive  after taking this medication. Norco (hydrocodone/acetaminophen) 5/325 mg:  This is only to be used after you have taken the Motrin or the Tylenol. Every 8 hours as needed.   Over the counter Medication to take: Ibuprofen (Motrin) 600 mg:  Take this every 6 hours.  If you have additional pain then take 500 mg of the Tylenol every 8 hours.  Only take the Norco after you have tried these two. MiraLAX or Milk of Magnesia: Take this according to the bottle if you take the Norco.  WHEN TO CALL Call your surgeon's office if any of the following occur: Fever 101 degrees F or greater Excessive bleeding or fluid from the incision site. Pain that increases over time without aid from the medications Redness, warmth, or pus draining from incision sites Persistent nausea or inability to take in liquids Severe misshapen  area that underwent the operation.

## 2023-02-09 NOTE — Transfer of Care (Signed)
Immediate Anesthesia Transfer of Care Note  Patient: Judith Thomas  Procedure(s) Performed: BREAST REDUCTION WITH LIPOSUCTION (Left: Breast)  Patient Location: PACU  Anesthesia Type:General  Level of Consciousness: awake, alert , and oriented  Airway & Oxygen Therapy: Patient Spontanous Breathing and Patient connected to face mask oxygen  Post-op Assessment: Report given to RN and Post -op Vital signs reviewed and stable  Post vital signs: Reviewed and stable  Last Vitals:  Vitals Value Taken Time  BP    Temp    Pulse 93 02/09/23 0938  Resp    SpO2 99 % 02/09/23 0938  Vitals shown include unvalidated device data.  Last Pain:  Vitals:   02/09/23 0639  TempSrc: Temporal  PainSc: 0-No pain      Patients Stated Pain Goal: 5 (02/09/23 1610)  Complications: No notable events documented.

## 2023-02-09 NOTE — Anesthesia Procedure Notes (Signed)
Procedure Name: LMA Insertion Date/Time: 02/09/2023 8:21 AM  Performed by: Ronnette Hila, CRNAPre-anesthesia Checklist: Patient identified, Emergency Drugs available, Suction available and Patient being monitored Patient Re-evaluated:Patient Re-evaluated prior to induction Oxygen Delivery Method: Circle system utilized Preoxygenation: Pre-oxygenation with 100% oxygen Induction Type: IV induction Ventilation: Mask ventilation without difficulty LMA: LMA inserted LMA Size: 4.0 Number of attempts: 1 Airway Equipment and Method: Bite block Placement Confirmation: positive ETCO2 Tube secured with: Tape Dental Injury: Teeth and Oropharynx as per pre-operative assessment

## 2023-02-09 NOTE — Anesthesia Postprocedure Evaluation (Signed)
Anesthesia Post Note  Patient: Judith Thomas  Procedure(s) Performed: BREAST REDUCTION WITH LIPOSUCTION (Left: Breast)     Patient location during evaluation: PACU Anesthesia Type: General Level of consciousness: awake and alert Pain management: pain level controlled Vital Signs Assessment: post-procedure vital signs reviewed and stable Respiratory status: spontaneous breathing, nonlabored ventilation and respiratory function stable Cardiovascular status: blood pressure returned to baseline and stable Postop Assessment: no apparent nausea or vomiting Anesthetic complications: no   No notable events documented.  Last Vitals:  Vitals:   02/09/23 1030 02/09/23 1107  BP: (!) 125/109 (!) 145/79  Pulse: 90 68  Resp: 17 16  Temp:  (!) 36.3 C  SpO2: 97% 99%    Last Pain:  Vitals:   02/09/23 1107  TempSrc: Temporal  PainSc: 0-No pain                 Lowella Curb

## 2023-02-09 NOTE — Progress Notes (Signed)
[place] [facility]  Progress Note    [name]  Date of service: 02/09/2023   Date of Admission:  01/28/2023    Hospital Day:  LOS: 12 days   Subjective: [name] is a 67 year old female who initially presented to the hospital on 04/20 secondary to headache with associated epigastric pain and hypertension.  Did have episode of chest pain that resolved spontaneously but this headache has been ongoing since.  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  We are consulted postoperatively for critical care management.  [name] still drowsy postprocedure but has been extubated.  Per report is a current everyday smoker.  Unable to obtain full HPI/ROS.     4/30:  Awake and alert.  On 2 L of oxygen.  Has been out of bed most of the morning.  Had several episodes of nausea and vomiting overnight for which she received IV fluid.  Nausea a little better this a.m. but still not much appetite.  Was able to eat a few crackers.  She denies shortness of breath or fever.  She does have postoperative surgical discomfort that has been well managed with analgesia.     5/1:  Up in chair currently on 2 L nasal cannula satting well with minimal shortness of breath.  Still having some issues with deep breath however getting chest tubes and wires out today.    5/2:  Up in chair currently on 2 L nasal cannula.  Shortness of breath improving.  Does not have home O2.  Chest tubes and wires have been removed, still with JP drain in place.  Progressing well.    Vital Signs:  Temp  Avg: 36.9 C (98.5 F)  Min: 36.7 C (98.1 F)  Max: 37.1 C (98.8 F)    Pulse  Avg: 93.6  Min: 82  Max: 119 BP  Min: 81/48  Max: 124/69   Resp  Avg: 21.3  Min: 15   Max: 32 SpO2  Avg: 96 %  Min: 90 %  Max: 99 %          Input/Output    Intake/Output Summary (Last 24 hours) at 02/09/2023 1034  Last data filed at 02/09/2023 0500  Gross per 24 hour   Intake 480 ml   Output 560 ml   Net -80 ml    I/O last shift:  No intake/output data recorded.   acetaminophen (TYLENOL) tablet, 650 mg, Oral, Q4H PRN  anastrozole (ARIMIDEX) tablet, 1 mg, Oral, Daily  ascorbic acid (VITAMIN C) tablet, 500 mg, Oral, Daily  aspirin chewable tablet 81 mg, 81 mg, Oral, Daily  atorvastatin (LIPITOR) tablet, 40 mg, Oral, QPM  bisacodyl (DULCOLAX) rectal suppository, 10 mg, Rectal, Daily PRN  cholecalciferol (VITAMIN D3) 1000 unit (25 mcg) tablet, 1,000 Units, Oral, Daily  clonazePAM (klonoPIN) tablet, 0.5 mg, Oral, Q12H PRN  clopidogrel (PLAVIX) 75 mg tablet, 75 mg, Oral, Daily  Correction/SSIP insulin lispro (HumaLOG) 100 units/mL injection, 0-18 Units, Subcutaneous, 4x/day AC  dextrose (GLUTOSE) 40% oral gel, 15 g, Oral, Q15 Min PRN  dextrose 50% (0.5 g/mL) injection - syringe, 12.5 g, Intravenous, Q15 Min PRN  ferrous sulfate 325 mg (elemental IRON 65 mg) enteric coated tablet, 325  mg, Oral, Daily before Breakfast  fluticasone (FLONASE) 50 mcg per spray nasal spray, 1 Spray, Each Nostril, Once PRN  furosemide (LASIX) 10 mg/mL injection, 20 mg, Intravenous, Once  glucagon (GLUCAGEN) injection 1 mg, 1 mg, IntraMUSCULAR, Once PRN  guaiFENesin (MUCINEX) extended release tablet - for cough (expectorant), 600 mg, Oral, 2x/day  HYDROmorphone (DILAUDID) 0.5 mg/0.5 mL injection, 0.2 mg, Intravenous, Q4H PRN  ipratropium (ATROVENT) 0.02% nebulizer solution, 0.5 mg, Nebulization, Q4H PRN  levalbuterol (XOPENEX) 1.25 mg/ 3 mL nebulizer solution, 1.25 mg, Nebulization, Q4H PRN  magnesium hydroxide (MILK OF MAGNESIA) 400mg  per 5mL oral liquid, 30 mL, Oral, Daily PRN  metoprolol tartrate (LOPRESSOR) tablet, 25 mg, Oral, 2x/day  multivitamin (THERA) tablet, 1 Tablet, Oral, Daily  mupirocin (BACTROBAN) 2% topical ointment,  , Apply Topically, 2x/day  nitroGLYCERIN (NITROSTAT) sublingual tablet, 0.4 mg, Sublingual, Q5 Min PRN  omega-3 fatty acids (LOVAZA) capsule, 1 g, Oral, Daily  ondansetron (ZOFRAN) 2 mg/mL injection, 4 mg, Intravenous, Q4H PRN  oxyCODONE (ROXICODONE) immediate release tablet, 5 mg, Oral, Q4H PRN  oxyCODONE (ROXICODONE) immediate release tablet, 10 mg, Oral, Q4H PRN  oxyCODONE-acetaminophen (PERCOCET) 5-325mg  per tablet, 1 Tablet, Oral, Q6H PRN  pantoprazole (PROTONIX) delayed release tablet, 40 mg, Oral, Daily  sennosides-docusate sodium (SENOKOT-S) 8.6-50mg  per tablet, 1 Tablet, Oral, 2x/day  sertraline (ZOLOFT) tablet, 100 mg, Oral, Daily  traZODone (DESYREL) tablet, 150 mg, Oral, NIGHTLY        Physical Exam:    GENERAL:  Awake alert oriented in no distress  SKIN: Warm and dry.  Healing midline chest incision  HEAD: Atraumatic, normocephalic.  EYES: Pupils equal and reactive. EOMI.  EARS AND NOSE: Normal to examination.  THROAT: No exudate, no pharyngeal erythema noted.  NECK: Supple, Trachea midline, no lymphadenopathy.  CARDIAC: S1 and S2, no murmurs or gallops. No S3.  LUNGS: Clear to auscultation bilaterally. No wheezes or rhonchi. No accessory muscle use noted.  GI: Abdomen soft, nontender. Good bowel sounds x 4. No organomegaly appreciated.  GU: No suprapubic tenderness. No costovertebral tenderness.  MUSCULOSKELETAL: Good muscle strength throughout.  EXTREMITIES: No pedal edema noted. Distal pulses equal bilaterally.  NEUROLOGICAL: Cranial nerves grossly intact. No gross focal deficit.  PSYCHIATRIC: Appropriate mood and affect.     Labs:     Results for orders placed or performed during the hospital encounter of 01/28/23 (from the past 24 hour(s))   POC BLOOD GLUCOSE (RESULTS)   Result Value Ref Range    GLUCOSE, POC 131 (H) 70 - 100 mg/dl   POC BLOOD GLUCOSE (RESULTS)   Result Value Ref Range    GLUCOSE, POC 144 (H) 70 - 100 mg/dl   POC BLOOD GLUCOSE (RESULTS)   Result Value Ref Range    GLUCOSE, POC 125  (H) 70 - 100 mg/dl   BASIC METABOLIC PANEL   Result Value Ref Range    SODIUM 145 (H) 133 - 144 mmol/L    POTASSIUM 4.2 3.2 - 5.0 mmol/L    CHLORIDE 107 (H) 96 - 106 mmol/L    CO2 TOTAL 32 (H) 22 - 30 mmol/L    CALCIUM 9.0 8.3 - 10.7 mg/dL    GLUCOSE 960 (H) 74 - 109 mg/dL    BUN 13 8 - 23 mg/dL    CREATININE 4.54 0.98 - 0.90 mg/dL    BUN/CREA RATIO 22     ESTIMATED GFR >90 >90 mL/min/1.75m^2   CBC   Result Value Ref Range    WBC 9.1 3.7 - 11.0 x10^3/uL  RBC 2.44 (L) 3.85 - 5.22 x10^6/uL    HGB 7.1 (L) 11.5 - 16.0 g/dL    HCT 29.5 (L) 62.1 - 46.0 %    MCV 95.9 78.0 - 100.0 fL    MCH 29.1 26.0 - 32.0 pg    MCHC 30.3 (L) 31.0 - 35.5 g/dL    RDW-CV 30.8 65.7 - 84.6 %    PLATELETS 132 (L) 150 - 400 x10^3/uL    MPV 10.5 8.7 - 12.5 fL   MAGNESIUM   Result Value Ref Range    MAGNESIUM 1.8 1.6 - 2.4 mg/dL   PHOSPHORUS   Result Value Ref Range    PHOSPHORUS 2.6 2.5 - 4.5 mg/dL      Radiology:      Results for orders placed or performed during the hospital encounter of 01/28/23 (from the past 2160 hour(s))   XR AP MOBILE CHEST     Status: None    Narrative    [name]    XR AP MOBILE CHEST performed on 02/08/2023 4:51 AM.    INDICATION:  POST-CABG   Additional History:  post CABG    TECHNIQUE:  Single portable frontal view of the chest.  1 views/1 images submitted for interpretation.    COMPARISON: 28 January 2023  ___________________________________  FINDINGS:    New postoperative changes including sternotomy. Central catheter line terminates in the. Multiple indwelling chest tubes. No visible pneumothorax or pneumomediastinum. There is opacity at the left lung base partially obscuring the left hemidiaphragm which likely represents atelectasis.      Impression    New postoperative changes. Opacity at the left base which is likely atelectasis.        Radiologist location ID: [id number]     XR AP MOBILE CHEST     Status: None    Narrative    [name]    XR AP MOBILE CHEST performed on 02/09/2023 5:30  AM.    INDICATION:  post chest tube removal   Additional History:  post chest tube removal    TECHNIQUE:  Single portable frontal view of the chest.  1 views/1 images submitted for interpretation.    COMPARISON:  Feb 08, 2023  ___________________________________  FINDINGS:    Central venous catheter and chest tubes have been removed. No visible pneumothorax. Persistent opacity at the left base may relate any combination of atelectasis, infiltrate and/or pleural effusion. Minimal atelectasis and/or infiltrate now evident the right base.      Impression    Removal of support lines as noted. New mild atelectasis and/or infiltrate at the right base. Stable findings on the left.        Radiologist location ID: [id number]         Assessment & Plan:  Active Hospital Problems    Diagnosis    Primary Problem: Chest pain    S/P CABG x 1    Essential hypertension    Hyperlipidemia      Patient seen and examined, chart reviewed  Patient recently found to have severe in stent restenosis of lesions in the LAD and is now status post coronary artery bypass graft x1  Currently on 2 L nasal cannula, wean as tolerated for target goal sat 90% or greater   Chest tubes have been removed  Adequate pain control  Encourage ambulation  Push pulmonary hygiene w/ IS   Did review preop PFT with moderate obstruction and FEV1 57% predicted  Continue nebs then resume home MDI as  Will  check 6 minute walk to assess for home O2 needs, will likely need this temporarily  Progressing well and okay for discharge from a pulmonary standpoint     This case was discussed with the pulmonary attending who will add any further change in his addendum to this note.    Wynelle Bourgeois, APRN-FNP-BC    This note may have been partially generated using MModal Fluency Direct system, and there may be some incorrect words, spellings, and punctuation that were not noted in checking the note before saving.    I personally saw and evaluated the patient as part of a  shared service with an APP.    My substantive findings are:  MDM (complete) greater than 50% of medical decision-making by me personally.  I have addressed 1 or more acute illness requiring hospitalization and or causing organ dysfunction including:  Coronary disease status post coronary artery bypass graft hypertension hyperlipidemia     [name] currently on 2 L of oxygen we will wean to keep sat greater 90 we will need to assess for home O2 needs prior to discharge including 6 minute walk test     Patient clinically is doing very well     Okay to discharge from a pulmonary standpoint.    Dawna Part III, DO  02/09/2023 15:49

## 2023-02-09 NOTE — PT Evaluation (Signed)
[facility]   [address]  [place], [place], [address]  ([phone]          Inpatient Therapy Missed Visit      Date: 02/09/2023  Patient's Name: [name]  Date of Birth: 04-19-1956      PATIENT DID NOT PARTICIPATE IN THERAPY TODAY DUE TO: cardiac rehab reported patient is ambulating 5x per day and does not have any PT needs.  PT will sign off. Please re-consult if any new needs arise.        Aurther Loft, PT 02/09/2023,09:34

## 2023-02-09 NOTE — Progress Notes (Signed)
[place] [facility]    Cardiology Inpatient Progress Note    [name], [place], 67 y.o. female  Inpatient Admission Date: 01/28/2023   Date of Service: 02/07/2023   Date of Birth:  October 06, 1956  PCP:  Lillia Mountain, PA-C    Subjective:  Patient feels weak low sleepy otherwise she feels fine she has some incisional discomfort but otherwise no discomfort.  Not eating much.  Much of an appetite.  Remains in sinus rhythm.    Inpatient Meds:  acetaminophen (TYLENOL) tablet, 650 mg, Oral, Q4H PRN  anastrozole (ARIMIDEX) tablet, 1 mg, Oral, Daily  ascorbic acid (VITAMIN C) tablet, 500 mg, Oral, Daily  aspirin chewable tablet 81 mg, 81 mg, Oral, Daily  atorvastatin (LIPITOR) tablet, 40 mg, Oral, QPM  bisacodyl (DULCOLAX) rectal suppository, 10 mg, Rectal, Daily PRN  cholecalciferol (VITAMIN D3) 1000 unit (25 mcg) tablet, 1,000 Units, Oral, Daily  clonazePAM (klonoPIN) tablet, 0.5 mg, Oral, Q12H PRN  clopidogrel (PLAVIX) 75 mg tablet, 75 mg, Oral, Daily  Correction/SSIP insulin lispro (HumaLOG) 100 units/mL injection, 0-18 Units, Subcutaneous, 4x/day AC  dextrose (GLUTOSE) 40% oral gel, 15 g, Oral, Q15 Min PRN  dextrose 50% (0.5 g/mL) injection - syringe, 12.5 g, Intravenous, Q15 Min PRN  ferrous sulfate 325 mg (elemental IRON 65 mg) enteric coated tablet, 325 mg, Oral, Daily before Breakfast  fluticasone (FLONASE) 50 mcg per spray nasal spray, 1 Spray, Each Nostril, Once PRN  glucagon (GLUCAGEN) injection 1 mg, 1 mg, IntraMUSCULAR, Once PRN  guaiFENesin (MUCINEX) extended release tablet - for cough (expectorant), 600 mg, Oral, 2x/day  HYDROmorphone (DILAUDID) 0.5 mg/0.5 mL injection, 0.2 mg, Intravenous, Q4H PRN  ipratropium (ATROVENT) 0.02% nebulizer solution, 0.5 mg, Nebulization, Q4H PRN  levalbuterol (XOPENEX) 1.25 mg/ 3 mL nebulizer solution, 1.25 mg, Nebulization, Q4H PRN  magnesium hydroxide (MILK OF MAGNESIA) 400mg  per 5mL oral liquid, 30 mL, Oral, Daily PRN  metoprolol  tartrate (LOPRESSOR) tablet, 25 mg, Oral, 2x/day  multivitamin (THERA) tablet, 1 Tablet, Oral, Daily  mupirocin (BACTROBAN) 2% topical ointment, , Apply Topically, 2x/day  nitroGLYCERIN (NITROSTAT) sublingual tablet, 0.4 mg, Sublingual, Q5 Min PRN  omega-3 fatty acids (LOVAZA) capsule, 1 g, Oral, Daily  ondansetron (ZOFRAN) 2 mg/mL injection, 4 mg, Intravenous, Q4H PRN  oxyCODONE (ROXICODONE) immediate release tablet, 5 mg, Oral, Q4H PRN  oxyCODONE (ROXICODONE) immediate release tablet, 10 mg, Oral, Q4H PRN  oxyCODONE-acetaminophen (PERCOCET) 5-325mg  per tablet, 1 Tablet, Oral, Q6H PRN  pantoprazole (PROTONIX) delayed release tablet, 40 mg, Oral, Daily  sennosides-docusate sodium (SENOKOT-S) 8.6-50mg  per tablet, 1 Tablet, Oral, 2x/day  sertraline (ZOLOFT) tablet, 100 mg, Oral, Daily  traZODone (DESYREL) tablet, 150 mg, Oral, NIGHTLY       No Known Allergies    Exam:  HEENT: PERLA  Neck No JVD or bruit  Lungs clear  Heart RRR no murmurs rubs or gallops appreciated  Abd N X 4  Ext no clubbing or cyanosis.  No edema  Neuro AAO x 3      Labs:    CBC Results Differential Results   Recent Labs     02/08/23  0341 02/09/23  0555   WBC 10.2 9.1   HGB 7.0* 7.1*   HCT 22.3* 23.4*    Recent Labs     02/08/23  0341   PMNS 81.0   MONOCYTES 7.7   BASOPHILS 0.2  <0.10   PMNABS 8.28*   LYMPHSABS 1.02   MONOSABS 0.79   EOSABS <0.10      BMP Results Other Chemistries Results  Recent Labs     02/08/23  0341 02/09/23  0555   SODIUM 144 145*   POTASSIUM 3.8 4.2   CHLORIDE 107* 107*   CO2 27 32*   BUN 11 13   CREATININE 0.64 0.59   GFR >90 >90    Recent Labs     02/08/23  0341 02/09/23  0555   CALCIUM 8.9 9.0   MAGNESIUM 1.7 1.8   PHOSPHORUS  --  2.6      Liver/Pancreas Enzyme Results Blood Gas     No results for input(s): "TOTALPROTEIN", "ALBUMIN", "PREALBUMIN", "AST", "ALT", "ALKPHOS", "LDH", "AMYLASE", "LIPASE" in the last 72 hours.    Invalid input(s): "GGT" No results found for this encounter   Cardiac Results    Coags Results      No results for input(s): "UHCEASTTROPI", "CKMB", "MBINDEX", "BNP" in the last 72 hours. No results for input(s): "INR" in the last 72 hours.    Invalid input(s): "PTT", "PT"       Imaging Studies:     Last Echo       LEFT HEART CATH  No results found for this or any previous visit.    STRESS TEST/MYOCARDIAL PERFUSION IMAGING  No results found for this or any previous visit.      Previous CXR:   No results found for this or any previous visit (from the past [id number] hour(s)).     Previous ECG results:   No results found for this visit on 01/28/23 (from the past 720 hour(s)).    Telemetry:  Sinus rhythm without ectopy    Assessment/Plan:  Active Hospital Problems    Diagnosis    Primary Problem: Chest pain    S/P CABG x 1    Essential hypertension    Hyperlipidemia     Hypotension, we will obtain echocardiogram.  The patient is on 25 mg of Lopressor b.i.d.  Hemoglobin is 7.0 treatment per surgery      Casimer Bilis, MD

## 2023-02-09 NOTE — Nurses Notes (Signed)
Pt arrived on the stepdown unit from the ICU, with two ICU nurses, on a monitor.  Pt transfrerred with minimal assistance to the stepdown bed.  Pt complains of pain in her chest, PRN medication given before transfer. IV flushes, Pt oriented to the unit, call light in reach, pt instructed to call before attempting to get out of bed. Pt verbalized understanding. Dressings are dry and intact, pulses good on all four extremities.

## 2023-02-09 NOTE — Care Plan (Signed)
[facility]   Cardiac Rehab Phase 1  [address]  [place], [place] [address]      Cardiac Rehab Phase 1 Daily Note    [name]   09-20-1956   Height: 157.5 cm (5' 2.01")  Weight: 71.2 kg (156 lb 15.5 oz)   25/A   Payor: [employer] MEDICARE / Plan: [employer] CHOICE PPO / Product Type: PPO /        Cardiac Rehab Orders Received and Chart Reviewed:      Cardiac Dx:  CABG    Date of Event/Surgery:     Risk Factors:Tobacco Use    Pre HR 94  Pre BP 109/86  Pre O2 94    Post HR 85  Post BP 117/76  Post O2 93    Activity Performed: Pt ambulated for 6 minute walk test on room air.  She denied cardiac s/sx.  Gait slightly unsteady.  She returned to her bed with call light in reach.      Assessment:       Education Provided:  Discussed the plan of care and encouraged mobility with staff 4x daily.  All questions answered.  Encouraged complete tobacco cessation.    HEP given:     Comments:       Time Spent 30    Cardiac Rehab Phase 2 Referral Location: [facility] in [address] [place], [place]

## 2023-02-09 NOTE — Care Plan (Signed)
[place] [facility] Nutrition Therapy Assessment      Reason for Assessment: Follow up    SUBJECTIVE :   PT ADMITTED WITH CHEST PAIN, HTN, NEWLY DX PRE-DIABETIC.  PT S/P CABG 4/29 AND TRANSFERRED TO ICU POST OP.  PT REPORTS UBW 143-144#, CBW- 156#, NO RECENT WT LOSS, PT 108% UBW IF CBW IS ACCURATE.  PT REPORTS NO CHEWING OR SWALLOWING DIFFICULTY EVEN THOUGH DENTURES ARE AT HOME.  DECLINED DIET TEXTURE CHANGE.  PT REPORTS HER APPETITE FLUCTUATES AT HOME AND HERE.  NURSING REPORTS 0-75% OF MEALS.  AVE INTAKE PAST 2 DAYS 33%.  PT AGREEABLE TO STARTING GLUCERNA SUPPLEMENT UNTIL APPETITE IMPROVES.  PT WAS PROVIDED WITH CHO CONTROLLED/CARDIAC DIET EDUCATION 5/1 AS PER CONSULT.  PT HAD NO QUESTIONS TODAY ABOUT THE DIET.  PT IS AMBULATING WITH NURSING.     OBJECTIVE:    Labs: Lab Results Today:    Results for orders placed or performed during the hospital encounter of 01/28/23 (from the past 24 hour(s))   POC BLOOD GLUCOSE (RESULTS)   Result Value Ref Range    GLUCOSE, POC 131 (H) 70 - 100 mg/dl   POC BLOOD GLUCOSE (RESULTS)   Result Value Ref Range    GLUCOSE, POC 144 (H) 70 - 100 mg/dl   POC BLOOD GLUCOSE (RESULTS)   Result Value Ref Range    GLUCOSE, POC 125 (H) 70 - 100 mg/dl   BASIC METABOLIC PANEL   Result Value Ref Range    SODIUM 145 (H) 133 - 144 mmol/L    POTASSIUM 4.2 3.2 - 5.0 mmol/L    CHLORIDE 107 (H) 96 - 106 mmol/L    CO2 TOTAL 32 (H) 22 - 30 mmol/L    CALCIUM 9.0 8.3 - 10.7 mg/dL    GLUCOSE 144 (H) 74 - 109 mg/dL    BUN 13 8 - 23 mg/dL    CREATININE 8.18 5.63 - 0.90 mg/dL    BUN/CREA RATIO 22     ESTIMATED GFR >90 >90 mL/min/1.34m^2   CBC   Result Value Ref Range    WBC 9.1 3.7 - 11.0 x10^3/uL    RBC 2.44 (L) 3.85 - 5.22 x10^6/uL    HGB 7.1 (L) 11.5 - 16.0 g/dL    HCT 14.9 (L) 70.2 - 46.0 %    MCV 95.9 78.0 - 100.0 fL    MCH 29.1 26.0 - 32.0 pg    MCHC 30.3 (L) 31.0 - 35.5 g/dL    RDW-CV 63.7 85.8 - 85.0 %    PLATELETS 132 (L) 150 - 400 x10^3/uL    MPV 10.5 8.7 - 12.5 fL   MAGNESIUM    Result Value Ref Range    MAGNESIUM 1.8 1.6 - 2.4 mg/dL   PHOSPHORUS   Result Value Ref Range    PHOSPHORUS 2.6 2.5 - 4.5 mg/dL   POC BLOOD GLUCOSE (RESULTS)   Result Value Ref Range    GLUCOSE, POC 133 (H) 70 - 100 mg/dl       Meds: acetaminophen (TYLENOL) tablet, 650 mg, Oral, Q4H PRN  anastrozole (ARIMIDEX) tablet, 1 mg, Oral, Daily  ascorbic acid (VITAMIN C) tablet, 500 mg, Oral, Daily  aspirin chewable tablet 81 mg, 81 mg, Oral, Daily  atorvastatin (LIPITOR) tablet, 40 mg, Oral, QPM  bisacodyl (DULCOLAX) rectal suppository, 10 mg, Rectal, Daily PRN  cholecalciferol (VITAMIN D3) 1000 unit (25 mcg) tablet, 1,000 Units, Oral, Daily  clonazePAM (klonoPIN) tablet, 0.5 mg, Oral, Q12H PRN  clopidogrel (PLAVIX) 75 mg  tablet, 75 mg, Oral, Daily  Correction/SSIP insulin lispro (HumaLOG) 100 units/mL injection, 0-18 Units, Subcutaneous, 4x/day AC  dextrose (GLUTOSE) 40% oral gel, 15 g, Oral, Q15 Min PRN  dextrose 50% (0.5 g/mL) injection - syringe, 12.5 g, Intravenous, Q15 Min PRN  ferrous sulfate 325 mg (elemental IRON 65 mg) enteric coated tablet, 325 mg, Oral, Daily before Breakfast  fluticasone (FLONASE) 50 mcg per spray nasal spray, 1 Spray, Each Nostril, Once PRN  glucagon (GLUCAGEN) injection 1 mg, 1 mg, IntraMUSCULAR, Once PRN  guaiFENesin (MUCINEX) extended release tablet - for cough (expectorant), 600 mg, Oral, 2x/day  HYDROmorphone (DILAUDID) 0.5 mg/0.5 mL injection, 0.2 mg, Intravenous, Q4H PRN  ipratropium (ATROVENT) 0.02% nebulizer solution, 0.5 mg, Nebulization, Q4H PRN  levalbuterol (XOPENEX) 1.25 mg/ 3 mL nebulizer solution, 1.25 mg, Nebulization, Q4H PRN  magnesium hydroxide (MILK OF MAGNESIA) 400mg  per 5mL oral liquid, 30 mL, Oral, Daily PRN  metoprolol tartrate (LOPRESSOR) tablet, 25 mg, Oral, 2x/day  multivitamin (THERA) tablet, 1 Tablet, Oral, Daily  mupirocin (BACTROBAN) 2% topical ointment, , Apply Topically, 2x/day  nitroGLYCERIN (NITROSTAT) sublingual tablet, 0.4 mg, Sublingual, Q5 Min  PRN  omega-3 fatty acids (LOVAZA) capsule, 1 g, Oral, Daily  ondansetron (ZOFRAN) 2 mg/mL injection, 4 mg, Intravenous, Q4H PRN  oxyCODONE (ROXICODONE) immediate release tablet, 5 mg, Oral, Q4H PRN  oxyCODONE (ROXICODONE) immediate release tablet, 10 mg, Oral, Q4H PRN  oxyCODONE-acetaminophen (PERCOCET) 5-325mg  per tablet, 1 Tablet, Oral, Q6H PRN  pantoprazole (PROTONIX) delayed release tablet, 40 mg, Oral, Daily  sennosides-docusate sodium (SENOKOT-S) 8.6-50mg  per tablet, 1 Tablet, Oral, 2x/day  sertraline (ZOLOFT) tablet, 100 mg, Oral, Daily  traZODone (DESYREL) tablet, 150 mg, Oral, NIGHTLY        Comments: NURSING REPORTS NO SKIN BREAKDOWN, NO EDEMA. PT HAS POST OP INCISION CHEST AND JP DRAIN LOWER MID CHEST.  BMI- 28.7 OVERWT,. EST. NEEDS/D CALCULATED USING ABW FOR WT MAINT, PRO. NEEDS/D CALCULATED FOR POST OP HEALING.  RECOMMEND CHANGING DIET TO DIABETIC/CARDIAC 60 GM CHO DIET WITH GLUCERNA ONE CARTON DAILY UNTIL APPETITE IMPROVES.  WILL CONTINUE TO FOLLOW PT.     PLAN / INTERVENTION        Goals:  MEET EST. NUTRIENT NEEDS.    Monitor / Evaluate: Monitor: Oral Intake, Supplement Intake, Weight Status, and Pertinent Labs .    Recommend : RECOMMEND DIET CHANGE AND SUPPLEMENT AS NEEDED ABOVE.    Nutrition Diagnosis: Inadequate protein-energy intake ; related to Current medical condition ; as evidenced by Decreased oral intake    Con Memos, RD 02/09/2023, 13:05    EXT:  [id number]

## 2023-02-09 NOTE — Progress Notes (Signed)
[facility] Surgery  CARDIAC SURGERY FLOOR/STEPDOWN PROGRESS NOTE      [name], [name]  Date of Admission:  01/28/2023  Date of Birth:  [birth year]    Hospital Day:  LOS: 12 days   Post-op Day:   , S/P  POD # 2 S/P CABG  Date of Service:  02/09/2023    SUBJECTIVE:   OOB to chair  Family at bedside  O2 sat 2 LNC O2 sat 93%    OBJECTIVE:    Vital Signs:  Temp (24hrs) Max:37.1 C (98.8 F)      Temperature: 37.1 C (98.8 F) (02/08/23 1900)  BP (Non-Invasive): (!) 110/93 (02/09/23 0900)  MAP (Non-Invasive): 99 mmHG (02/09/23 0900)  Heart Rate: 95 (02/09/23 0900)  Respiratory Rate: 18 (02/09/23 0900)  SpO2: 94 % (02/09/23 0900)    Base (Admission) Weight: Weight: 71.2 kg (157 lb)  Weight:  Weight: 71.2 kg (156 lb 15.5 oz)    Current Inpatient Medications:  acetaminophen (TYLENOL) tablet, 650 mg, Oral, Q4H PRN  anastrozole (ARIMIDEX) tablet, 1 mg, Oral, Daily  ascorbic acid (VITAMIN C) tablet, 500 mg, Oral, Daily  aspirin chewable tablet 81 mg, 81 mg, Oral, Daily  atorvastatin (LIPITOR) tablet, 40 mg, Oral, QPM  bisacodyl (DULCOLAX) rectal suppository, 10 mg, Rectal, Daily PRN  cholecalciferol (VITAMIN D3) 1000 unit (25 mcg) tablet, 1,000 Units, Oral, Daily  clonazePAM (klonoPIN) tablet, 0.5 mg, Oral, Q12H PRN  clopidogrel (PLAVIX) 75 mg tablet, 75 mg, Oral, Daily  Correction/SSIP insulin lispro (HumaLOG) 100 units/mL injection, 0-18 Units, Subcutaneous, 4x/day AC  dextrose (GLUTOSE) 40% oral gel, 15 g, Oral, Q15 Min PRN  dextrose 50% (0.5 g/mL) injection - syringe, 12.5 g, Intravenous, Q15 Min PRN  fluticasone (FLONASE) 50 mcg per spray nasal spray, 1 Spray, Each Nostril, Once PRN  glucagon (GLUCAGEN) injection 1 mg, 1 mg, IntraMUSCULAR, Once PRN  guaiFENesin (MUCINEX) extended release tablet - for cough (expectorant), 600 mg, Oral, 2x/day  HYDROmorphone (DILAUDID) 0.5 mg/0.5 mL injection, 0.2 mg, Intravenous, Q4H PRN  ipratropium (ATROVENT) 0.02% nebulizer solution, 0.5 mg, Nebulization, Q4H PRN  levalbuterol (XOPENEX) 1.25  mg/ 3 mL nebulizer solution, 1.25 mg, Nebulization, Q4H PRN  magnesium hydroxide (MILK OF MAGNESIA) 400mg  per 5mL oral liquid, 30 mL, Oral, Daily PRN  metoprolol tartrate (LOPRESSOR) tablet, 12.5 mg, Oral, 2x/day  multivitamin (THERA) tablet, 1 Tablet, Oral, Daily  mupirocin (BACTROBAN) 2% topical ointment, , Apply Topically, 2x/day  nitroGLYCERIN (NITROSTAT) sublingual tablet, 0.4 mg, Sublingual, Q5 Min PRN  omega-3 fatty acids (LOVAZA) capsule, 1 g, Oral, Daily  ondansetron (ZOFRAN) 2 mg/mL injection, 4 mg, Intravenous, Q4H PRN  oxyCODONE (ROXICODONE) immediate release tablet, 5 mg, Oral, Q4H PRN  oxyCODONE (ROXICODONE) immediate release tablet, 10 mg, Oral, Q4H PRN  oxyCODONE-acetaminophen (PERCOCET) 5-325mg  per tablet, 1 Tablet, Oral, Q6H PRN  pantoprazole (PROTONIX) delayed release tablet, 40 mg, Oral, Daily  sennosides-docusate sodium (SENOKOT-S) 8.6-50mg  per tablet, 1 Tablet, Oral, 2x/day  sertraline (ZOLOFT) tablet, 100 mg, Oral, Daily  traZODone (DESYREL) tablet, 150 mg, Oral, NIGHTLY          I/O:  I/O last 24 hours to current time:    Intake/Output Summary (Last 24 hours) at 02/09/2023 0932  Last data filed at 02/09/2023 0500  Gross per 24 hour   Intake 480 ml   Output 870 ml   Net -390 ml     I/O last 3 completed shifts:  In: 720 [P.O.:720]  Out: 870 [Urine:800; Drains:70]      Nutrition/Diet:  DIET DIABETIC CARDIAC Carb Amount:  CCD3 = 45 Grams      Labs:  Reviewed    Radiology:    Reviewed    Physical Exam:    Constitutional:  no distress  Cardiovascular:  regular rate and rhythm  Abd:  Soft, non-tender  Ext: no calf tenderness    ASSESSMENT/PLAN:  Wean oxygen as tolerated  Stable post operative anemia, will add iron  Continue ASA, Plavix, BB, and statin  Home health at discharge BMP, CBC 48 hours, 1 week and 2 week post op  Progress with ambulation  Lasix 20 IV X 1              Karma Ganja, FNP-BC 02/09/2023, 09:32

## 2023-02-09 NOTE — Op Note (Signed)
Breast Reduction Op note:    DATE OF PROCEDURE: 02/09/2023  LOCATION: [facility]  SURGEON: Foster Simpson, DO  ASSISTANT: Burna Forts, PA  PREOPERATIVE DIAGNOSIS 1. Breast Asymmetry secondary to breast cancer of right breast 2. Macromastia  POSTOPERATIVE DIAGNOSIS Same as preoperative diagnosis  PROCEDURES 1. Left breast reduction 500 gm  COMPLICATIONS: None.  DRAINS: none  INDICATIONS FOR PROCEDURE Judith Thomas is a 67 y.o. year-old female born on 01/31/1956,with a history of right breast cancer.  This resulted in breast asymmetry.  She was radiated on the right breast.  The decision was made not to do surgery on the right due to the high risk of complications due to the radiation. She also had mammary hypertrophy and indentations of her shoulders from her bra.   MRN: [id number]  CONSENT Informed consent was obtained directly from the patient. The risks, benefits and alternatives were fully discussed. Specific risks including but not limited to bleeding, infection, hematoma, seroma, scarring, pain, nipple necrosis, asymmetry, poor cosmetic results, and need for further surgery were discussed. The patient's questions were answered.  DESCRIPTION OF PROCEDURE  Patient was brought into the operating room and rested on the operating room table in the supine position.  SCDs were placed and appropriate padding was performed.  Antibiotics were given. The patient underwent general anesthesia and the chest was prepped and draped in a sterile fashion.  A timeout was performed and all information was confirmed to be correct by those in the room. Tumescent was placed in the lateral breast.  Liposuction was done laterally for better contour.  Left side: Preoperative markings were confirmed.  Incision lines were injected with local containing epinephrine.  After waiting for vasoconstriction, the marked lines were incised with a #15 blade.  A Wise-pattern superomedial  breast reduction was performed by de-epithelializing the pedicle, using bovie to create the superomedial pedicle, and removing breast tissue from the lateral and inferior portions of the breast.  Care was taken to not undermine the breast pedicle. Hemostasis was achieved.  Experel and cellerate were placed in the pocket. The nipple was gently rotated into position and the soft tissue was closed with 3-0 and 4-0 Monocryl.  The patient was sat upright and size and shape symmetry was confirmed.  The pocket was irrigated and hemostasis confirmed.  The deep tissues were approximated with 3-0 PDS sutures. The skin was closed with deep dermal 3-0 Monocryl and subcuticular 4-0 Monocryl sutures.  Dermabond was applied.  A breast binder and ABDs were placed.  The nipple and skin flaps had good capillary refill at the end of the procedure.  The patient tolerated the procedure well. The patient was allowed to wake from anesthesia and taken to the recovery room in satisfactory condition.  The advanced practice practitioner (APP) assisted throughout the case.  The APP was essential in retraction and counter traction when needed to make the case progress smoothly.  This retraction and assistance made it possible to see the tissue plans for the procedure.  The assistance was needed for blood control, tissue re-approximation and assisted with closure of the incision site.

## 2023-02-09 NOTE — Anesthesia Procedure Notes (Signed)
Procedure Name: LMA Insertion Date/Time: 02/09/2023 8:21 AM  Performed by: Harneet Noblett D, CRNAPre-anesthesia Checklist: Patient identified, Emergency Drugs available, Suction available and Patient being monitored Patient Re-evaluated:Patient Re-evaluated prior to induction Oxygen Delivery Method: Circle system utilized Preoxygenation: Pre-oxygenation with 100% oxygen Induction Type: IV induction Ventilation: Mask ventilation without difficulty LMA: LMA inserted LMA Size: 4.0 Number of attempts: 1 Airway Equipment and Method: Bite block Placement Confirmation: positive ETCO2 Tube secured with: Tape Dental Injury: Teeth and Oropharynx as per pre-operative assessment       

## 2023-02-09 NOTE — Interval H&P Note (Signed)
History and Physical Interval Note:  02/09/2023 8:06 AM  Judith Thomas  has presented today for surgery, with the diagnosis of Malignant neoplasm of upper-inner quadrant of right breast in female.  The various methods of treatment have been discussed with the patient and family. After consideration of risks, benefits and other options for treatment, the patient has consented to  Procedure(s): MAMMARY REDUCTION  (BREAST) (Left) as a surgical intervention.  The patient's history has been reviewed, patient examined, no change in status, stable for surgery.  I have reviewed the patient's chart and labs.  Questions were answered to the patient's satisfaction.     Alena Bills Dymond Spreen

## 2023-02-09 NOTE — Anesthesia Preprocedure Evaluation (Signed)
Anesthesia Evaluation  Patient identified by MRN, date of birth, ID band Patient awake    Reviewed: Allergy & Precautions, H&P , NPO status , Patient's Chart, lab work & pertinent test results  Airway Mallampati: II   Neck ROM: full    Dental no notable dental hx.    Pulmonary neg pulmonary ROS   breath sounds clear to auscultation       Cardiovascular negative cardio ROS  Rhythm:regular Rate:Normal     Neuro/Psych negative neurological ROS  negative psych ROS   GI/Hepatic negative GI ROS, Neg liver ROS,,,  Endo/Other  negative endocrine ROS    Renal/GU negative Renal ROS  negative genitourinary   Musculoskeletal negative musculoskeletal ROS (+)    Abdominal  (+) + obese  Peds negative pediatric ROS (+)  Hematology negative hematology ROS (+)   Anesthesia Other Findings   Reproductive/Obstetrics negative OB ROS H/o breast CA                             Anesthesia Physical Anesthesia Plan  ASA: II  Anesthesia Plan: General   Post-op Pain Management: Dilaudid IV   Induction: Intravenous  PONV Risk Score and Plan: 3 and Treatment may vary due to age or medical condition, Ondansetron, Dexamethasone and Midazolam  Airway Management Planned: Oral ETT  Additional Equipment:   Intra-op Plan:   Post-operative Plan: Extubation in OR  Informed Consent: I have reviewed the patients History and Physical, chart, labs and discussed the procedure including the risks, benefits and alternatives for the proposed anesthesia with the patient or authorized representative who has indicated his/her understanding and acceptance.       Plan Discussed with: CRNA, Anesthesiologist and Surgeon  Anesthesia Plan Comments:         Anesthesia Quick Evaluation

## 2023-02-10 ENCOUNTER — Inpatient Hospital Stay (HOSPITAL_COMMUNITY): Payer: Commercial Managed Care - PPO

## 2023-02-10 ENCOUNTER — Inpatient Hospital Stay (HOSPITAL_BASED_OUTPATIENT_CLINIC_OR_DEPARTMENT_OTHER): Payer: Commercial Managed Care - PPO

## 2023-02-10 ENCOUNTER — Encounter (HOSPITAL_BASED_OUTPATIENT_CLINIC_OR_DEPARTMENT_OTHER): Payer: Self-pay | Admitting: Plastic Surgery

## 2023-02-10 ENCOUNTER — Ambulatory Visit (INDEPENDENT_AMBULATORY_CARE_PROVIDER_SITE_OTHER): Payer: Self-pay | Admitting: Surgical

## 2023-02-10 DIAGNOSIS — N62 Hypertrophy of breast: Secondary | ICD-10-CM

## 2023-02-10 DIAGNOSIS — Z9889 Other specified postprocedural states: Secondary | ICD-10-CM

## 2023-02-10 DIAGNOSIS — N6489 Other specified disorders of breast: Secondary | ICD-10-CM

## 2023-02-10 DIAGNOSIS — C50211 Malignant neoplasm of upper-inner quadrant of right female breast: Secondary | ICD-10-CM

## 2023-02-10 DIAGNOSIS — I4891 Unspecified atrial fibrillation: Secondary | ICD-10-CM

## 2023-02-10 DIAGNOSIS — R7303 Prediabetes: Secondary | ICD-10-CM | POA: Diagnosis present

## 2023-02-10 DIAGNOSIS — R9431 Abnormal electrocardiogram [ECG] [EKG]: Secondary | ICD-10-CM

## 2023-02-10 DIAGNOSIS — R042 Hemoptysis: Secondary | ICD-10-CM

## 2023-02-10 DIAGNOSIS — I959 Hypotension, unspecified: Secondary | ICD-10-CM

## 2023-02-10 LAB — IONIZED CALCIUM WITH PH
IONIZED CALCIUM: 1.18 mmol/L (ref 1.15–1.33)
PH (VENOUS): 7.36 (ref 7.32–7.43)

## 2023-02-10 LAB — COMPREHENSIVE METABOLIC PANEL, NON-FASTING
ALBUMIN: 3.1 g/dL — ABNORMAL LOW (ref 3.5–5.2)
ALKALINE PHOSPHATASE: 77 U/L (ref 35–129)
ALT (SGPT): 21 U/L (ref 0–33)
AST (SGOT): 23 U/L (ref 0–32)
BILIRUBIN TOTAL: 0.6 mg/dL (ref 0.2–1.2)
BUN: 16 mg/dL (ref 8–23)
CALCIUM: 8.7 mg/dL (ref 8.3–10.7)
CHLORIDE: 105 mmol/L (ref 96–106)
CO2 TOTAL: 32 mmol/L — ABNORMAL HIGH (ref 22–30)
CREATININE: 0.63 mg/dL (ref 0.50–0.90)
ESTIMATED GFR: >90 mL/min/{1.73_m2} (ref >90)
GLUCOSE: 103 mg/dL (ref 74–109)
POTASSIUM: 4.1 mmol/L (ref 3.2–5.0)
PROTEIN TOTAL: 5.5 g/dL — ABNORMAL LOW (ref 6.4–8.3)
SODIUM: 143 mmol/L (ref 133–144)

## 2023-02-10 LAB — CBC WITH DIFF
BASOPHIL #: <0.1 10*3/uL (ref <=0.20)
BASOPHIL %: 0.5 %
EOSINOPHIL #: 0.22 10*3/uL (ref <=0.50)
EOSINOPHIL %: 2.6 %
HCT: 25.2 % — ABNORMAL LOW (ref 34.8–46.0)
HGB: 8 g/dL — ABNORMAL LOW (ref 11.5–16.0)
IMMATURE GRANULOCYTE #: <0.1 10*3/uL (ref <0.10)
IMMATURE GRANULOCYTE %: 0.5 % (ref 0.0–1.0)
LYMPHOCYTE #: 1.39 10*3/uL (ref 1.00–4.80)
LYMPHOCYTE %: 16.6 %
MCH: 29.5 pg (ref 26.0–32.0)
MCHC: 31.7 g/dL (ref 31.0–35.5)
MCV: 93 fL (ref 78.0–100.0)
MONOCYTE #: 0.58 10*3/uL (ref 0.20–1.10)
MONOCYTE %: 6.9 %
MPV: 9.7 fL (ref 8.7–12.5)
NEUTROPHIL #: 6.09 10*3/uL (ref 1.50–7.70)
NEUTROPHIL %: 72.9 %
PLATELETS: 178 10*3/uL (ref 150–400)
RBC: 2.71 10*6/uL — ABNORMAL LOW (ref 3.85–5.22)
RDW-CV: 14.2 % (ref 11.5–15.5)
WBC: 8.4 10*3/uL (ref 3.7–11.0)

## 2023-02-10 LAB — PHOSPHORUS: PHOSPHORUS: 3.9 mg/dL (ref 2.5–4.5)

## 2023-02-10 LAB — PT/INR
INR: 1.1 (ref 0.86–1.14)
PROTHROMBIN TIME: 14.4 seconds — ABNORMAL HIGH (ref 11.4–14.2)

## 2023-02-10 LAB — POC BLOOD GLUCOSE (RESULTS)
GLUCOSE, POC: 119 mg/dl — ABNORMAL HIGH (ref 70–100)
GLUCOSE, POC: 184 mg/dl — ABNORMAL HIGH (ref 70–100)
GLUCOSE, POC: 123 mg/dl — ABNORMAL HIGH (ref 70–100)
GLUCOSE, POC: 174 mg/dl — ABNORMAL HIGH (ref 70–100)

## 2023-02-10 LAB — ECG 12 LEAD
Calculated R Axis: 38 degrees
Calculated T Axis: 231 degrees
QRS Duration: 84 ms
QT Interval: 252 ms
QTC Calculation: 438 ms
Ventricular rate: 182 {beats}/min

## 2023-02-10 LAB — SPUTUM SCREEN

## 2023-02-10 LAB — MAGNESIUM: MAGNESIUM: 2.2 mg/dL (ref 1.6–2.4)

## 2023-02-10 MED ORDER — FUROSEMIDE 10 MG/ML INJECTION SOLUTION
40.0000 mg | Freq: Once | INTRAMUSCULAR | Status: AC
Start: 2023-02-10 — End: 2023-02-10
  Administered 2023-02-10: 40 mg via INTRAVENOUS
  Filled 2023-02-10: qty 4

## 2023-02-10 NOTE — Progress Notes (Signed)
[name] a very pleasant 67 year old female who underwent left breast reduction yesterday for improved symmetry.  She reports today that she is doing well, she reports pain has been tolerable, she has been using ibuprofen only.  She is not having any infectious symptoms.  She reports normal urination, and reports positive BM and flatus.  She has some questions about ambulating around her yard/driveway.  She denies any fatigue weakness or dizziness.  Discussed with patient that ambulation is important after surgery, encouraged her to ambulate throughout the day, she can go on a short walk if she feels safe to do so and is well-hydrated and not having any symptoms of fatigue or weakness.  She was agreeable and understanding of this.  She reported that she felt great and would call us if she had any additional questions or concerns.  The patient gave consent to have this visit done by telemedicine / virtual visit, two identifiers were used to identify patient. This is also consent for access the chart and treat the patient via this visit. The patient is located in [place].  I, the provider, am at the office.  We spent 3 minutes together for the visit.  Joined by telephone.

## 2023-02-10 NOTE — Consults (Addendum)
ENDOCRINOLOGY PROGRESS NOTE      Name: [name]  Age: 67 y.o.  MRN: [id number]    Reason for Consult: prediabetes    Subjective:   [name] sitting in chair and has no complaints.  Glucose this morning 103.  She has met with dietician and she states she is eating well.  She is on diabetic diet.     Review of Systems:   Negative except for what has been mentioned in subjective.    Objective :  Patient Vitals for the past 24 hrs:   BP Temp Pulse Resp SpO2 Weight   02/10/23 1500 106/61 36.9 C (98.5 F) 79 14 95 % --   02/10/23 1141 -- -- -- -- 97 % --   02/10/23 1100 105/76 37 C (98.6 F) 71 16 97 % --   02/10/23 0700 98/65 36.8 C (98.2 F) 70 20 96 % --   02/10/23 0645 (!) 98/52 36.7 C (98.1 F) 69 18 -- --   02/10/23 0615 (!) 85/61 36.6 C (97.9 F) 66 20 95 % --   02/10/23 0600 -- -- -- -- -- 65.4 kg (144 lb 2.9 oz)   02/10/23 0400 93/62 36.8 C (98.2 F) 69 18 97 % --   02/10/23 0300 (!) 84/54 36.7 C (98.1 F) 71 16 96 % --   02/10/23 0200 -- -- -- -- 97 % --   02/10/23 0145 (!) 93/58 37.1 C (98.8 F) 68 (!) 21 98 % --   02/10/23 0140 (!) 76/46 37 C (98.6 F) 67 19 96 % --   02/10/23 0126 (!) 82/43 36 C (96.8 F) 68 (!) 22 95 % --   02/10/23 0000 93/72 37.2 C (99 F) (!) 180 20 96 % --   02/09/23 2300 -- -- -- -- 96 % --   02/09/23 2111 -- -- -- 18 -- --   02/09/23 1900 112/83 37.3 C (99.2 F) (!) 103 18 92 % --   02/09/23 1800 117/78 -- 98 20 92 % --   02/09/23 1700 108/70 -- 97 19 -- --   02/09/23 1600 102/63 -- 98 19 94 % --          Physical Exam  Constitutional:       Appearance: Normal appearance.   HENT:      Head: Normocephalic and atraumatic.   Eyes:      Extraocular Movements: Extraocular movements intact.   Cardiovascular:      Rate and Rhythm: Normal rate and regular rhythm.   Pulmonary:      Effort: Pulmonary effort is normal.      Breath sounds: Normal breath sounds.   Abdominal:      General: Bowel sounds are normal.      Palpations: Abdomen is soft.      Tenderness: There is no  abdominal tenderness.   Musculoskeletal:      Right ankle: No swelling.      Left ankle: No swelling.   Neurological:      Mental Status: She is alert.          Labs and Imaging:  Results for orders placed or performed during the hospital encounter of 01/28/23 (from the past 24 hour(s))   POC BLOOD GLUCOSE (RESULTS)   Result Value Ref Range    GLUCOSE, POC 128 (H) 70 - 100 mg/dl   POC BLOOD GLUCOSE (RESULTS)   Result Value Ref Range    GLUCOSE, POC 151 (H) 70 - 100  mg/dl   H & H   Result Value Ref Range    HGB 6.7 (LL) 11.5 - 16.0 g/dL    HCT 16.1 (L) 09.6 - 46.0 %   COMPREHENSIVE METABOLIC PANEL, NON-FASTING   Result Value Ref Range    SODIUM 139 133 - 144 mmol/L    POTASSIUM 3.3 3.2 - 5.0 mmol/L    CHLORIDE 101 96 - 106 mmol/L    CO2 TOTAL 30 22 - 30 mmol/L    BUN 15 8 - 23 mg/dL    CREATININE 0.45 4.09 - 0.90 mg/dL    ESTIMATED GFR >81 >19 mL/min/1.85m^2    ALBUMIN 3.1 (L) 3.5 - 5.2 g/dL    CALCIUM 8.7 8.3 - 14.7 mg/dL    GLUCOSE 829 (H) 74 - 109 mg/dL    ALKALINE PHOSPHATASE 73 35 - 129 U/L    ALT (SGPT) 19 0 - 33 U/L    AST (SGOT) 23 0 - 32 U/L    BILIRUBIN TOTAL 0.2 0.2 - 1.2 mg/dL    PROTEIN TOTAL 5.4 (L) 6.4 - 8.3 g/dL   MAGNESIUM - ONCE   Result Value Ref Range    MAGNESIUM 1.4 (L) 1.6 - 2.4 mg/dL   PHOSPHORUS   Result Value Ref Range    PHOSPHORUS 3.2 2.5 - 4.5 mg/dL   IONIZED CALCIUM WITH PH   Result Value Ref Range    PH (VENOUS) 7.54 (H) 7.32 - 7.43    IONIZED CALCIUM 1.14 (L) 1.15 - 1.33 mmol/L   TYPE AND SCREEN   Result Value Ref Range    UNITS ORDERED NOT STATED     ABO/RH(D) O POSITIVE     ANTIBODY SCREEN NEGATIVE     SPECIMEN EXPIRATION DATE 02/12/2023,2359    CROSSMATCH RED CELLS - UNITS , 1 Units   Result Value Ref Range    Coding System [id number]     UNIT NUMBER [id number]     BLOOD COMPONENT TYPE LR RBC, Adsol1, 04710     UNIT DIVISION 00     UNIT DISPENSE STATUS ISSUED     TRANSFUSION STATUS OK TO TRANSFUSE     IS CROSSMATCH Electronically Compatible     Product Code [id number]    IONIZED  CALCIUM WITH PH   Result Value Ref Range    PH (VENOUS) 7.36 7.32 - 7.43    IONIZED CALCIUM 1.18 1.15 - 1.33 mmol/L   PHOSPHORUS   Result Value Ref Range    PHOSPHORUS 3.9 2.5 - 4.5 mg/dL   MAGNESIUM   Result Value Ref Range    MAGNESIUM 2.2 1.6 - 2.4 mg/dL   COMPREHENSIVE METABOLIC PANEL, NON-FASTING   Result Value Ref Range    SODIUM 143 133 - 144 mmol/L    POTASSIUM 4.1 3.2 - 5.0 mmol/L    CHLORIDE 105 96 - 106 mmol/L    CO2 TOTAL 32 (H) 22 - 30 mmol/L    BUN 16 8 - 23 mg/dL    CREATININE 9.62 9.52 - 0.90 mg/dL    ESTIMATED GFR >84 >13 mL/min/1.13m^2    ALBUMIN 3.1 (L) 3.5 - 5.2 g/dL    CALCIUM 8.7 8.3 - 24.4 mg/dL    GLUCOSE 010 74 - 272 mg/dL    ALKALINE PHOSPHATASE 77 35 - 129 U/L    ALT (SGPT) 21 0 - 33 U/L    AST (SGOT) 23 0 - 32 U/L    BILIRUBIN TOTAL 0.6 0.2 - 1.2 mg/dL    PROTEIN TOTAL 5.5 (L) 6.4 -  8.3 g/dL   PT/INR   Result Value Ref Range    PROTHROMBIN TIME 14.4 (H) 11.4 - 14.2 seconds    INR 1.10 0.86 - 1.14   POC BLOOD GLUCOSE (RESULTS)   Result Value Ref Range    GLUCOSE, POC 119 (H) 70 - 100 mg/dl   CBC WITH DIFF   Result Value Ref Range    WBC 8.4 3.7 - 11.0 x10^3/uL    RBC 2.71 (L) 3.85 - 5.22 x10^6/uL    HGB 8.0 (L) 11.5 - 16.0 g/dL    HCT 51.8 (L) 84.1 - 46.0 %    MCV 93.0 78.0 - 100.0 fL    MCH 29.5 26.0 - 32.0 pg    MCHC 31.7 31.0 - 35.5 g/dL    RDW-CV 66.0 63.0 - 16.0 %    PLATELETS 178 150 - 400 x10^3/uL    MPV 9.7 8.7 - 12.5 fL    NEUTROPHIL % 72.9 %    LYMPHOCYTE % 16.6 %    MONOCYTE % 6.9 %    EOSINOPHIL % 2.6 %    BASOPHIL % 0.5 %    NEUTROPHIL # 6.09 1.50 - 7.70 x10^3/uL    LYMPHOCYTE # 1.39 1.00 - 4.80 x10^3/uL    MONOCYTE # 0.58 0.20 - 1.10 x10^3/uL    EOSINOPHIL # 0.22 <=0.50 x10^3/uL    BASOPHIL # <0.10 <=0.20 x10^3/uL    IMMATURE GRANULOCYTE % 0.5 0.0 - 1.0 %    IMMATURE GRANULOCYTE # <0.10 <0.10 x10^3/uL   SPUTUM SCREEN    Specimen: Sputum   Result Value Ref Range    SPUTUM SCREEN       Reject for Culture, <=25 PMNs per low power microscopic field (10x objective lens)   POC  BLOOD GLUCOSE (RESULTS)   Result Value Ref Range    GLUCOSE, POC 184 (H) 70 - 100 mg/dl            Assessment/Plan:  Active Hospital Problems    Diagnosis    Primary Problem: Chest pain    Prediabetes    S/P CABG x 1    Essential hypertension    Hyperlipidemia      I discussed with her starting Jardiance 10 mg a day on discharge,   Glucose 103 this am     Anastasio Champion, MD

## 2023-02-10 NOTE — Care Management Notes (Signed)
Social Services     Home health referral was made to [facility]. Please complete home health face to face order prior to DC and call report to them at [phone].

## 2023-02-10 NOTE — Progress Notes (Signed)
[facility]  Cardiac Surgery   Floor/Stepdown Progress Note      Janiyia, Burkard  Date of Admission:  01/28/2023  Date of Birth:  Nov 24, 1955    Hospital Day:  LOS: 13 days   Post-op Day:   , S/P  coronary artery bypass graft x1  Date of Service:  02/10/2023    SUBJECTIVE:  She is feeling better this morning.  She had atrial fibrillation with rapid ventricular response last evening and was started on amiodarone infusion.  She converted to sinus rhythm around midnight.  She remains in sinus rhythm this morning.  She had an episode of hemoptysis last evening that has resolved.  Plavix and subcu heparin have been held.    OBJECTIVE:    Vital Signs:  Temp (24hrs) Max:37.3 C (99.2 F)      Temperature: 36.8 C (98.2 F) (02/10/23 0700)  BP (Non-Invasive): 98/65 (02/10/23 0700)  MAP (Non-Invasive): 75 mmHG (02/10/23 0700)  Heart Rate: 70 (02/10/23 0700)  Respiratory Rate: 20 (02/10/23 0700)  SpO2: 96 % (02/10/23 0700)    Base (Admission) Weight:  Weight: 71.2 kg (157 lb)  Weight:  Weight: 65.4 kg (144 lb 2.9 oz)    Current Inpatient Medications:  acetaminophen (TYLENOL) tablet, 650 mg, Oral, Q4H PRN  amiodarone (NEXTERONE) 360 mg in D5W premix infusion, 1 mg/min, Intravenous, Continuous  amiodarone (NEXTERONE) 360 mg/200 mL (1.8 mg/mL) premix infusion ---Cabinet Override, , ,   amiodarone in D5W (NEXTERONE) 150 mg/100 mL (1.5 mg/mL) premix infusion ---Cabinet Override, , ,   anastrozole (ARIMIDEX) tablet, 1 mg, Oral, Daily  ascorbic acid (VITAMIN C) tablet, 500 mg, Oral, Daily  aspirin chewable tablet 81 mg, 81 mg, Oral, Daily  atorvastatin (LIPITOR) tablet, 40 mg, Oral, QPM  bisacodyl (DULCOLAX) rectal suppository, 10 mg, Rectal, Daily PRN  cholecalciferol (VITAMIN D3) 1000 unit (25 mcg) tablet, 1,000 Units, Oral, Daily  clonazePAM (klonoPIN) tablet, 0.5 mg, Oral, Q12H PRN  clopidogrel (PLAVIX) 75 mg tablet, 75 mg, Oral, Daily  Correction/SSIP insulin lispro (HumaLOG) 100 units/mL injection, 0-18  Units, Subcutaneous, 4x/day AC  dextrose (GLUTOSE) 40% oral gel, 15 g, Oral, Q15 Min PRN  dextrose 50% (0.5 g/mL) injection - syringe, 12.5 g, Intravenous, Q15 Min PRN  ferrous sulfate 325 mg (elemental IRON 65 mg) enteric coated tablet, 325 mg, Oral, Daily before Breakfast  fluticasone (FLONASE) 50 mcg per spray nasal spray, 1 Spray, Each Nostril, Once PRN  glucagon (GLUCAGEN) injection 1 mg, 1 mg, IntraMUSCULAR, Once PRN  guaiFENesin (MUCINEX) extended release tablet - for cough (expectorant), 600 mg, Oral, 2x/day  HYDROmorphone (DILAUDID) 0.5 mg/0.5 mL injection, 0.2 mg, Intravenous, Q4H PRN  ipratropium (ATROVENT) 0.02% nebulizer solution, 0.5 mg, Nebulization, Q4H PRN  levalbuterol (XOPENEX) 1.25 mg/ 3 mL nebulizer solution, 1.25 mg, Nebulization, Q4H PRN  magnesium hydroxide (MILK OF MAGNESIA) 400mg  per 5mL oral liquid, 30 mL, Oral, Daily PRN  metoprolol (LOPRESSOR) 5 mg/5 mL injection ---Cabinet Override, , ,   metoprolol tartrate (LOPRESSOR) tablet, 25 mg, Oral, 2x/day  multivitamin (THERA) tablet, 1 Tablet, Oral, Daily  mupirocin (BACTROBAN) 2% topical ointment, , Apply Topically, 2x/day  nitroGLYCERIN (NITROSTAT) sublingual tablet, 0.4 mg, Sublingual, Q5 Min PRN  omega-3 fatty acids (LOVAZA) capsule, 1 g, Oral, Daily  ondansetron (ZOFRAN) 2 mg/mL injection, 4 mg, Intravenous, Q4H PRN  oxyCODONE (ROXICODONE) immediate release tablet, 5 mg, Oral, Q4H PRN  oxyCODONE (ROXICODONE) immediate release tablet, 10 mg, Oral, Q4H PRN  oxyCODONE-acetaminophen (PERCOCET) 5-325mg  per tablet, 1 Tablet, Oral, Q6H PRN  pantoprazole (PROTONIX) delayed release tablet, 40 mg, Oral, Daily  sennosides-docusate sodium (SENOKOT-S) 8.6-50mg  per tablet, 1 Tablet, Oral, 2x/day  sertraline (ZOLOFT) tablet, 100 mg, Oral, Daily  traZODone (DESYREL) tablet, 150 mg, Oral, NIGHTLY        Appropriate Home Meds restarted:  Yes    I/O:  I/O last 24 hours to current time:    Intake/Output Summary (Last 24 hours) at 02/10/2023 1123  Last data  filed at 02/10/2023 0801  Gross per 24 hour   Intake 1217.95 ml   Output 1030 ml   Net 187.95 ml     I/O last 3 completed shifts:  In: 1034.3 [P.O.:450; I.V.:34.3; Blood:400]  Out: 580 [Urine:500; Drains:80]    Nutrition/Diet:  DIET DIABETIC CARDIAC Carb Amount: CCD4 = 60 Grams  DIETARY SUPPLEMENTS Glucerna Shake - Chocolate; BREAKFAST; 1 Each    Hardware (foley):   Date Placed Necessity Reviewed  Date Discontinued    Foley (out day number two)                      Labs:  Reviewed:  I have reviewed all lab results.  Ordered:  N/A  Anticoagulation:  No  Target INR:  N/A   Follow Up Arranged:   N/A    Radiology:    Reviewed:  XR AP MOBILE CHEST    Result Date: 02/09/2023  Impression Removal of support lines as noted. New mild atelectasis and/or infiltrate at the right base. Stable findings on the left. Radiologist location ID: [id number]    Ordered:  N/A  ECHO (day 5 valves):  N/A    Microbiology:  N/A    Physical Exam:    Constitutional:  appears in good health, comfortable  Respiratory:  Clear to auscultation bilaterally.   Cardiovascular:  regular rate and rhythm, S1, S2 normal, no murmur, click, rub or gallop    ASSESSMENT/PLAN:  She converted to sinus rhythm last evening.  I will plan to transition from IV amiodarone to oral today.  Continue to observe regarding the hemoptysis.  If no further episodes I will restart Plavix tomorrow.  I will hold Lasix today.    Problem List:  Active Hospital Problems    Diagnosis    Primary Problem: Chest pain    S/P CABG x 1    Essential hypertension    Hyperlipidemia       PT/OT: Yes    DVT RISK FACTORS HAVE BEN ASSESSED AND PROPHYLAXIS ORDERED (SEE RUBYONLINE - REFERENCE TOOLS - MD, DVT PROPHY OR POCKET CARD):  YES    Disposition Planning: Home discharge      [facility], MD 02/10/2023 11:23

## 2023-02-10 NOTE — Progress Notes (Addendum)
[name]  Progress Note    [name]  Date of service: 02/10/2023   Date of Admission:  01/28/2023    Hospital Day:  LOS: 13 days     Chief complaint:  Hemoptysis    Subjective: [name] is a 67 year old female who initially presented to the hospital on 04/20 secondary to headache with associated epigastric pain and hypertension.  Did have episode of chest pain that resolved spontaneously but this headache has been ongoing since.  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  We are consulted postoperatively for critical care management.  [name] still drowsy postprocedure but has been extubated.  Per report is a current everyday smoker.  Unable to obtain full HPI/ROS.     4/30:  Awake and alert.  On 2 L of oxygen.  Has been out of bed most of the morning.  Had several episodes of nausea and vomiting overnight for which she received IV fluid.  Nausea a little better this a.m. but still not much appetite.  Was able to eat a few crackers.  She denies shortness of breath or fever.  She does have postoperative surgical discomfort that has been well managed with analgesia.     5/1:  Up in chair currently on 2 L nasal cannula satting well with minimal shortness of breath.  Still having some issues with deep breath however getting chest tubes and wires out today.     5/2:  Up in chair currently on 2 L nasal cannula.  Shortness of breath improving.  Does not have home O2.  Chest tubes and wires have been removed, still with JP drain in place.  Progressing well.     5/3:  Awake and alert.  On 2 L of oxygen.  Denies current shortness of breath, chest pain, fever, nausea vomiting.  Had an episode of shortness of  breath, chest tightness, and then coughed up bloody material during the night.  Nursing reports the development of rapid AFib for which she was placed on amiodarone drip.  She was currently in sinus rhythm.    Vital Signs:  Temp  Avg: 36.8 C (98.3 F)  Min: 36 C (96.8 F)  Max: 37.3 C (99.2 F)    Pulse  Avg: 85.4  Min: 66  Max: 180 BP  Min: 76/46  Max: 117/78   Resp  Avg: 18.9  Min: 16  Max: 22 SpO2  Avg: 95.6 %  Min: 92 %  Max: 98 %          Input/Output    Intake/Output Summary (Last 24 hours) at 02/10/2023 1516  Last data filed at 02/10/2023 0801  Gross per 24 hour   Intake 1217.95 ml   Output 490 ml   Net 727.95 ml    I/O last shift:  05/03 0700 - 05/03 1859  In: 183.65 [P.O.:150; I.V.:33.65]  Out: 450 [Urine:400; Drains:50]   acetaminophen (TYLENOL) tablet, 650 mg, Oral, Q4H PRN  amiodarone (NEXTERONE) 360 mg in D5W premix infusion, 1 mg/min, Intravenous, Continuous  amiodarone (NEXTERONE) 360 mg/200 mL (1.8 mg/mL) premix infusion ---Cabinet Override, , ,   amiodarone in D5W (NEXTERONE) 150 mg/100 mL (1.5 mg/mL) premix infusion ---  Cabinet Override, , ,   anastrozole (ARIMIDEX) tablet, 1 mg, Oral, Daily  ascorbic acid (VITAMIN C) tablet, 500 mg, Oral, Daily  aspirin chewable tablet 81 mg, 81 mg, Oral, Daily  atorvastatin (LIPITOR) tablet, 40 mg, Oral, QPM  bisacodyl (DULCOLAX) rectal suppository, 10 mg, Rectal, Daily PRN  cholecalciferol (VITAMIN D3) 1000 unit (25 mcg) tablet, 1,000 Units, Oral, Daily  clonazePAM (klonoPIN) tablet, 0.5 mg, Oral, Q12H PRN  clopidogrel (PLAVIX) 75 mg tablet, 75 mg, Oral, Daily  Correction/SSIP insulin lispro (HumaLOG) 100 units/mL injection, 0-18 Units, Subcutaneous, 4x/day AC  dextrose (GLUTOSE) 40% oral gel, 15 g, Oral, Q15 Min PRN  dextrose 50% (0.5 g/mL) injection - syringe, 12.5 g, Intravenous, Q15 Min PRN  ferrous sulfate 325 mg (elemental IRON 65 mg) enteric coated tablet, 325 mg, Oral, Daily before Breakfast  fluticasone (FLONASE) 50 mcg per spray nasal spray, 1  Spray, Each Nostril, Once PRN  glucagon (GLUCAGEN) injection 1 mg, 1 mg, IntraMUSCULAR, Once PRN  guaiFENesin (MUCINEX) extended release tablet - for cough (expectorant), 600 mg, Oral, 2x/day  HYDROmorphone (DILAUDID) 0.5 mg/0.5 mL injection, 0.2 mg, Intravenous, Q4H PRN  ipratropium (ATROVENT) 0.02% nebulizer solution, 0.5 mg, Nebulization, Q4H PRN  levalbuterol (XOPENEX) 1.25 mg/ 3 mL nebulizer solution, 1.25 mg, Nebulization, Q4H PRN  magnesium hydroxide (MILK OF MAGNESIA) 400mg  per 5mL oral liquid, 30 mL, Oral, Daily PRN  metoprolol (LOPRESSOR) 5 mg/5 mL injection ---Cabinet Override, , ,   metoprolol tartrate (LOPRESSOR) tablet, 25 mg, Oral, 2x/day  multivitamin (THERA) tablet, 1 Tablet, Oral, Daily  mupirocin (BACTROBAN) 2% topical ointment, , Apply Topically, 2x/day  nitroGLYCERIN (NITROSTAT) sublingual tablet, 0.4 mg, Sublingual, Q5 Min PRN  omega-3 fatty acids (LOVAZA) capsule, 1 g, Oral, Daily  ondansetron (ZOFRAN) 2 mg/mL injection, 4 mg, Intravenous, Q4H PRN  oxyCODONE (ROXICODONE) immediate release tablet, 5 mg, Oral, Q4H PRN  oxyCODONE (ROXICODONE) immediate release tablet, 10 mg, Oral, Q4H PRN  oxyCODONE-acetaminophen (PERCOCET) 5-325mg  per tablet, 1 Tablet, Oral, Q6H PRN  pantoprazole (PROTONIX) delayed release tablet, 40 mg, Oral, Daily  sennosides-docusate sodium (SENOKOT-S) 8.6-50mg  per tablet, 1 Tablet, Oral, 2x/day  sertraline (ZOLOFT) tablet, 100 mg, Oral, Daily  traZODone (DESYREL) tablet, 150 mg, Oral, NIGHTLY        Physical Exam:  GENERAL:  Awake alert oriented in no distress  SKIN: Warm and dry.  Healing midline chest incision  HEAD: Atraumatic, normocephalic.  EYES: Pupils equal and reactive. EOMI.  EARS AND NOSE: Normal to examination.  THROAT: No exudate, no pharyngeal erythema noted.  NECK: Supple, Trachea midline, no lymphadenopathy.  CARDIAC: S1 and S2, no murmurs or gallops. No S3.  LUNGS: Clear to auscultation bilaterally. No wheezes or rhonchi. No accessory muscle use noted.  GI:  Abdomen soft, nontender. Good bowel sounds x 4. No organomegaly appreciated.  GU: No suprapubic tenderness. No costovertebral tenderness.  MUSCULOSKELETAL: Good muscle strength throughout.  EXTREMITIES: No pedal edema noted. Distal pulses equal bilaterally.  NEUROLOGICAL: Cranial nerves grossly intact. No gross focal deficit.  PSYCHIATRIC: Appropriate mood and affect.     Respiratory requirements: 2 L.    Labs:     Results for orders placed or performed during the hospital encounter of 01/28/23 (from the past 24 hour(s))   POC BLOOD GLUCOSE (RESULTS)   Result Value Ref Range    GLUCOSE, POC 128 (H) 70 - 100 mg/dl   POC BLOOD GLUCOSE (RESULTS)   Result Value Ref Range    GLUCOSE, POC 151 (H) 70 - 100 mg/dl   H & H  Result Value Ref Range    HGB 6.7 (LL) 11.5 - 16.0 g/dL    HCT 16.1 (L) 09.6 - 46.0 %   COMPREHENSIVE METABOLIC PANEL, NON-FASTING   Result Value Ref Range    SODIUM 139 133 - 144 mmol/L    POTASSIUM 3.3 3.2 - 5.0 mmol/L    CHLORIDE 101 96 - 106 mmol/L    CO2 TOTAL 30 22 - 30 mmol/L    BUN 15 8 - 23 mg/dL    CREATININE 0.45 4.09 - 0.90 mg/dL    ESTIMATED GFR >81 >19 mL/min/1.6m^2    ALBUMIN 3.1 (L) 3.5 - 5.2 g/dL    CALCIUM 8.7 8.3 - 14.7 mg/dL    GLUCOSE 829 (H) 74 - 109 mg/dL    ALKALINE PHOSPHATASE 73 35 - 129 U/L    ALT (SGPT) 19 0 - 33 U/L    AST (SGOT) 23 0 - 32 U/L    BILIRUBIN TOTAL 0.2 0.2 - 1.2 mg/dL    PROTEIN TOTAL 5.4 (L) 6.4 - 8.3 g/dL   MAGNESIUM - ONCE   Result Value Ref Range    MAGNESIUM 1.4 (L) 1.6 - 2.4 mg/dL   PHOSPHORUS   Result Value Ref Range    PHOSPHORUS 3.2 2.5 - 4.5 mg/dL   IONIZED CALCIUM WITH PH   Result Value Ref Range    PH (VENOUS) 7.54 (H) 7.32 - 7.43    IONIZED CALCIUM 1.14 (L) 1.15 - 1.33 mmol/L   TYPE AND SCREEN   Result Value Ref Range    UNITS ORDERED NOT STATED     ABO/RH(D) O POSITIVE     ANTIBODY SCREEN NEGATIVE     SPECIMEN EXPIRATION DATE 02/12/2023,2359    CROSSMATCH RED CELLS - UNITS , 1 Units   Result Value Ref Range    Coding System [id number]     UNIT NUMBER  [id number]     BLOOD COMPONENT TYPE LR RBC, Adsol1, 04710     UNIT DIVISION 00     UNIT DISPENSE STATUS ISSUED     TRANSFUSION STATUS OK TO TRANSFUSE     IS CROSSMATCH Electronically Compatible     Product Code [id number]    IONIZED CALCIUM WITH PH   Result Value Ref Range    PH (VENOUS) 7.36 7.32 - 7.43    IONIZED CALCIUM 1.18 1.15 - 1.33 mmol/L   PHOSPHORUS   Result Value Ref Range    PHOSPHORUS 3.9 2.5 - 4.5 mg/dL   MAGNESIUM   Result Value Ref Range    MAGNESIUM 2.2 1.6 - 2.4 mg/dL   COMPREHENSIVE METABOLIC PANEL, NON-FASTING   Result Value Ref Range    SODIUM 143 133 - 144 mmol/L    POTASSIUM 4.1 3.2 - 5.0 mmol/L    CHLORIDE 105 96 - 106 mmol/L    CO2 TOTAL 32 (H) 22 - 30 mmol/L    BUN 16 8 - 23 mg/dL    CREATININE 9.62 9.52 - 0.90 mg/dL    ESTIMATED GFR >84 >13 mL/min/1.37m^2    ALBUMIN 3.1 (L) 3.5 - 5.2 g/dL    CALCIUM 8.7 8.3 - 24.4 mg/dL    GLUCOSE 010 74 - 272 mg/dL    ALKALINE PHOSPHATASE 77 35 - 129 U/L    ALT (SGPT) 21 0 - 33 U/L    AST (SGOT) 23 0 - 32 U/L    BILIRUBIN TOTAL 0.6 0.2 - 1.2 mg/dL    PROTEIN TOTAL 5.5 (L) 6.4 - 8.3 g/dL   PT/INR  Result Value Ref Range    PROTHROMBIN TIME 14.4 (H) 11.4 - 14.2 seconds    INR 1.10 0.86 - 1.14   POC BLOOD GLUCOSE (RESULTS)   Result Value Ref Range    GLUCOSE, POC 119 (H) 70 - 100 mg/dl   CBC WITH DIFF   Result Value Ref Range    WBC 8.4 3.7 - 11.0 x10^3/uL    RBC 2.71 (L) 3.85 - 5.22 x10^6/uL    HGB 8.0 (L) 11.5 - 16.0 g/dL    HCT 16.1 (L) 09.6 - 46.0 %    MCV 93.0 78.0 - 100.0 fL    MCH 29.5 26.0 - 32.0 pg    MCHC 31.7 31.0 - 35.5 g/dL    RDW-CV 04.5 40.9 - 81.1 %    PLATELETS 178 150 - 400 x10^3/uL    MPV 9.7 8.7 - 12.5 fL    NEUTROPHIL % 72.9 %    LYMPHOCYTE % 16.6 %    MONOCYTE % 6.9 %    EOSINOPHIL % 2.6 %    BASOPHIL % 0.5 %    NEUTROPHIL # 6.09 1.50 - 7.70 x10^3/uL    LYMPHOCYTE # 1.39 1.00 - 4.80 x10^3/uL    MONOCYTE # 0.58 0.20 - 1.10 x10^3/uL    EOSINOPHIL # 0.22 <=0.50 x10^3/uL    BASOPHIL # <0.10 <=0.20 x10^3/uL    IMMATURE GRANULOCYTE % 0.5  0.0 - 1.0 %    IMMATURE GRANULOCYTE # <0.10 <0.10 x10^3/uL   SPUTUM SCREEN    Specimen: Sputum   Result Value Ref Range    SPUTUM SCREEN       Reject for Culture, <=25 PMNs per low power microscopic field (10x objective lens)   POC BLOOD GLUCOSE (RESULTS)   Result Value Ref Range    GLUCOSE, POC 184 (H) 70 - 100 mg/dl      Radiology:      Results for orders placed or performed during the hospital encounter of 01/28/23 (from the past 2160 hour(s))   XR AP MOBILE CHEST     Status: None    Narrative    Tinzley JEAN Hobson    XR AP MOBILE CHEST performed on 02/08/2023 4:51 AM.    INDICATION:  POST-CABG   Additional History:  post CABG    TECHNIQUE:  Single portable frontal view of the chest.  1 views/1 images submitted for interpretation.    COMPARISON: 28 January 2023  ___________________________________  FINDINGS:    New postoperative changes including sternotomy. Central catheter line terminates in the. Multiple indwelling chest tubes. No visible pneumothorax or pneumomediastinum. There is opacity at the left lung base partially obscuring the left hemidiaphragm which likely represents atelectasis.      Impression    New postoperative changes. Opacity at the left base which is likely atelectasis.        Radiologist location ID: [id number]     XR AP MOBILE CHEST     Status: None    Narrative    Sumie JEAN Jenning    XR AP MOBILE CHEST performed on 02/09/2023 5:30 AM.    INDICATION:  post chest tube removal   Additional History:  post chest tube removal    TECHNIQUE:  Single portable frontal view of the chest.  1 views/1 images submitted for interpretation.    COMPARISON:  Feb 08, 2023  ___________________________________  FINDINGS:    Central venous catheter and chest tubes have been removed. No visible pneumothorax. Persistent opacity at the left base may relate any  combination of atelectasis, infiltrate and/or pleural effusion. Minimal atelectasis and/or infiltrate now evident the right base.      Impression    Removal of  support lines as noted. New mild atelectasis and/or infiltrate at the right base. Stable findings on the left.        Radiologist location ID: [id number]     XR AP MOBILE CHEST     Status: None    Narrative    Abrielle JEAN Min    XR AP MOBILE CHEST performed on 02/10/2023 10:33 AM.    INDICATION:  abnormal lung sounds   Additional History:  difficulty breathing, post op    TECHNIQUE:  Single portable frontal view of the chest.  1 views/1 images submitted for interpretation.    COMPARISON:  Feb 09, 2023  ___________________________________  FINDINGS:    Small left pleural effusion as before. Left basilar atelectasis and/or infiltrate increasingly obscures the left heart border suggesting some progression in the lingula.      Impression    Increasing infiltrate and/or atelectasis in the lingula obscuring the left heart border. Left pleural effusion as before.        Radiologist location ID: [id number]          Assessment/ Plan:   Active Hospital Problems    Diagnosis    Primary Problem: Chest pain    S/P CABG x 1    Essential hypertension    Hyperlipidemia     Will monitor hemoptysis closely.  She reports expectorating thin, clear sputum.  She was oxygenating well.  Target a sat above 90%.  Push activity and pulmonary toilet.  Monitor blood counts closely.  8.0 this a.m.Marland Kitchen  Chest x-ray does appear slightly congested.  She also appears to be developing a left-sided effusion.  Will give a dose of Lasix and consider looking in her left chest wall tomorrow with an ultrasound to better assess the size of her effusion.  Continue bronchodilators and ICS.  Will need a 6 minute walk prior to discharge.  Does have underlying chronic obstructive pulmonary disease with an FEV1 of 57% of predicted.  Continue incentive spirometry.  Discussed with nursing and CT surgery.  Dr. Marina Goodell was present during this evaluation and formulated the above described exam, assessment, and plan.  Scott Cristy Folks, APRN-FNP-BC,    I personally saw  and evaluated the patient as part of a shared service with an APP.    My substantive findings are:  MDM (complete) greater than 50% of medical decision-making by me personally.  I have addressed 1 or more acute illness requiring hospitalization and or causing organ dysfunction including:  Coronary disease status post coronary artery bypass graft hypertension hyperlipidemia      [name] currently on 2 L of oxygen we will wean to keep sat greater 90 we will need to assess for home O2 needs prior to discharge including 6 minute walk test     Had an episode of hemoptysis today    Wean FiO2 to keep sat greater 90     Chest x-ray appears to be slightly congested with a developing left effusion we will give Lasix and consider looking with the ultrasound this does not improve     Continue bronchodilators inhaled corticosteroids     We will need to get a 6 minute walk before discharge     Continue assess spirometer.      If hemoptysis would becomes severe can consider bronchoscopy this week.    Quita Skye  Marina Goodell III, DO  02/10/2023 15:44         (

## 2023-02-10 NOTE — Care Plan (Signed)
Problem: Adult Inpatient Plan of Care  Goal: Plan of Care Review  Outcome: Ongoing (see interventions/notes)  Goal: Patient-Specific Goal (Individualized)  Outcome: Ongoing (see interventions/notes)  Goal: Absence of Hospital-Acquired Illness or Injury  Outcome: Ongoing (see interventions/notes)  Intervention: Identify and Manage Fall Risk  Recent Flowsheet Documentation  Taken 02/10/2023 1300 by Thomes Dinning, RN  Safety Promotion/Fall Prevention: safety round/check completed  Intervention: Prevent Skin Injury  Recent Flowsheet Documentation  Taken 02/10/2023 1300 by Thomes Dinning, RN  Skin Protection: adhesive use limited  Intervention: Prevent Infection  Recent Flowsheet Documentation  Taken 02/10/2023 1300 by Thomes Dinning, RN  Infection Prevention: personal protective equipment utilized  Goal: Optimal Comfort and Wellbeing  Outcome: Ongoing (see interventions/notes)  Goal: Rounds/Family Conference  Outcome: Ongoing (see interventions/notes)     Problem: Fall Injury Risk  Goal: Absence of Fall and Fall-Related Injury  Outcome: Ongoing (see interventions/notes)  Intervention: Promote Injury-Free Environment  Recent Flowsheet Documentation  Taken 02/10/2023 1300 by Thomes Dinning, RN  Safety Promotion/Fall Prevention: safety round/check completed     Problem: Pain Acute  Goal: Optimal Pain Control and Function  Outcome: Ongoing (see interventions/notes)  Intervention: Prevent or Manage Pain  Recent Flowsheet Documentation  Taken 02/10/2023 1300 by Thomes Dinning, RN  Bowel Elimination Promotion: adequate fluid intake promoted     Problem: Fall Injury Risk  Goal: Absence of Fall and Fall-Related Injury  Outcome: Ongoing (see interventions/notes)  Intervention: Promote Scientist, clinical (histocompatibility and immunogenetics) Documentation  Taken 02/10/2023 1300 by Thomes Dinning, RN  Safety Promotion/Fall Prevention: safety round/check completed     Problem: Oral Intake Inadequate  Goal: Improved Oral Intake  Outcome: Ongoing (see  interventions/notes)

## 2023-02-10 NOTE — Progress Notes (Addendum)
[place] [facility]    Cardiology Inpatient Progress Note    [name], [name], 67 y.o. female  Inpatient Admission Date: 01/28/2023   Date of Service: 02/07/2023   Date of Birth:  July 24, 1956  PCP:  Lillia Mountain, PA-C    Subjective:  This is a 67 year old female known to Dr Kirk Ruths with previous PCI to LAD in 2019 with 3 DES to LAD.       She came to [facility] ER with chest pain.  She had a heart cath 01/31/23 with Dr Kirk Ruths revealing:   CORONARY ANATOMY  1.  Right coronary:  The right coronary artery is a small caliber vessel giving rise to PDA and posterolateral branches.  The right coronary artery, PDA, and posterolateral       branch have 20-30% diffuse disease.  2.  Left main coronary:  The left main bifurcates into the LAD and circumflex systems.  The left main has 0-5% diffuse disease.  3.  Left anterior descending coronary:  The LAD is a moderate size vessel system.  It has tandem mid-LAD in-stent restenosis.  Remainder of the vessel has 10-20% diffuse       disease.  4.  Circumflex coronary:  The circumflex gives rise to 3 OMs.  Circumflex proper, OM-1, OM-2, and OM-3 have 10-20% diffuse disease.  CONCLUSION  1.  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  2.  Mild circumflex and right coronary artery disease.  3.  Possible mild aortic stenosis on pullback gradient.     She had an echo that showed good EF with no significant valvular heart disease and grade 1 diastolic dysfunction.    Less than 50% bilateral carotids.       She is now post CABG and feeling ok.  Had some nausea and vomiting this morning was unable to keep breakfast down.  Sitting in chair.  Low grade temp and mild tachycardia.  Labs ok.       02/08/23 She is feeling better today.  Less nausea.  Ate a little breakfast.  No temp.  EKG looked ok yesterday.  Chest tubes being pulled today.       02/09/23 felt sleepy with a little incisional discomfort and poor appetite but otherwise felt fine.       02/10/23 POD #3 LIMA to LAD Dr Osvaldo Angst - went into rapid atrial fib last night now on IV amiodarone.  Currently back in sinus rhythm.  Reports coughing up blood. Transfused last night.  Lungs sound adventitious and has productive cough.  Generally does not feel well today.      Inpatient Meds:  acetaminophen (TYLENOL) tablet, 650 mg, Oral, Q4H PRN  amiodarone (NEXTERONE) 360 mg in D5W premix infusion, 1 mg/min, Intravenous, Continuous  amiodarone (NEXTERONE) 360 mg/200 mL (1.8 mg/mL) premix infusion ---Cabinet Override, , ,   amiodarone in D5W (NEXTERONE) 150 mg/100 mL (1.5 mg/mL) premix infusion ---Cabinet Override, , ,   anastrozole (ARIMIDEX) tablet, 1 mg, Oral, Daily  ascorbic acid (VITAMIN C) tablet, 500 mg, Oral, Daily  aspirin chewable tablet 81 mg, 81 mg, Oral, Daily  atorvastatin (LIPITOR) tablet, 40 mg, Oral, QPM  bisacodyl (DULCOLAX) rectal suppository, 10 mg, Rectal, Daily PRN  cholecalciferol (VITAMIN D3) 1000 unit (25 mcg) tablet, 1,000 Units, Oral, Daily  clonazePAM (klonoPIN) tablet, 0.5 mg, Oral, Q12H PRN  clopidogrel (PLAVIX) 75 mg tablet, 75 mg, Oral, Daily  Correction/SSIP insulin lispro (HumaLOG) 100 units/mL injection, 0-18 Units, Subcutaneous, 4x/day AC  dextrose (GLUTOSE) 40% oral gel, 15 g, Oral, Q15 Min PRN  dextrose 50% (0.5 g/mL) injection - syringe, 12.5 g, Intravenous, Q15 Min PRN  ferrous sulfate 325 mg (elemental IRON 65 mg) enteric coated tablet, 325 mg, Oral, Daily before Breakfast  fluticasone (FLONASE) 50 mcg per spray nasal spray, 1 Spray, Each Nostril, Once PRN  glucagon (GLUCAGEN) injection 1 mg, 1 mg, IntraMUSCULAR, Once PRN  guaiFENesin (MUCINEX) extended release tablet - for cough (expectorant), 600 mg, Oral, 2x/day  HYDROmorphone (DILAUDID) 0.5 mg/0.5 mL injection, 0.2 mg, Intravenous, Q4H PRN  ipratropium (ATROVENT) 0.02% nebulizer solution, 0.5 mg, Nebulization, Q4H PRN  levalbuterol (XOPENEX) 1.25 mg/ 3 mL nebulizer solution, 1.25 mg, Nebulization, Q4H  PRN  magnesium hydroxide (MILK OF MAGNESIA) 400mg  per 5mL oral liquid, 30 mL, Oral, Daily PRN  metoprolol (LOPRESSOR) 5 mg/5 mL injection ---Cabinet Override, , ,   metoprolol tartrate (LOPRESSOR) tablet, 25 mg, Oral, 2x/day  multivitamin (THERA) tablet, 1 Tablet, Oral, Daily  mupirocin (BACTROBAN) 2% topical ointment, , Apply Topically, 2x/day  nitroGLYCERIN (NITROSTAT) sublingual tablet, 0.4 mg, Sublingual, Q5 Min PRN  omega-3 fatty acids (LOVAZA) capsule, 1 g, Oral, Daily  ondansetron (ZOFRAN) 2 mg/mL injection, 4 mg, Intravenous, Q4H PRN  oxyCODONE (ROXICODONE) immediate release tablet, 5 mg, Oral, Q4H PRN  oxyCODONE (ROXICODONE) immediate release tablet, 10 mg, Oral, Q4H PRN  oxyCODONE-acetaminophen (PERCOCET) 5-325mg  per tablet, 1 Tablet, Oral, Q6H PRN  pantoprazole (PROTONIX) delayed release tablet, 40 mg, Oral, Daily  sennosides-docusate sodium (SENOKOT-S) 8.6-50mg  per tablet, 1 Tablet, Oral, 2x/day  sertraline (ZOLOFT) tablet, 100 mg, Oral, Daily  traZODone (DESYREL) tablet, 150 mg, Oral, NIGHTLY       No Known Allergies    Exam:  HEENT: PERLA  Neck No JVD or bruit  Lungs clear  Heart RRR no murmurs rubs or gallops appreciated  Abd N X 4  Ext no clubbing or cyanosis.  No edema  Neuro AAO x 3      Labs:    CBC Results Differential Results   Recent Labs     02/08/23  0341 02/09/23  0555 02/09/23  2227 02/10/23  0953   WBC 10.2 9.1  --  8.4   HGB 7.0* 7.1* 6.7* 8.0*   HCT 22.3* 23.4* 21.8* 25.2*    Recent Labs     02/08/23  0341 02/10/23  0953   PMNS 81.0 72.9   MONOCYTES 7.7 6.9   BASOPHILS 0.2  <0.10 0.5  <0.10   PMNABS 8.28* 6.09   LYMPHSABS 1.02 1.39   MONOSABS 0.79 0.58   EOSABS <0.10 0.22      BMP Results Other Chemistries Results   Recent Labs     02/09/23  0555 02/09/23  2227 02/10/23  0512   SODIUM 145* 139 143   POTASSIUM 4.2 3.3 4.1   CHLORIDE 107* 101 105   CO2 32* 30 32*   BUN 13 15 16    CREATININE 0.59 0.67 0.63   GFR >90 >90 >90    Recent Labs     02/09/23  0555 02/09/23  2227 02/10/23  0512    CALCIUM 9.0 8.7 8.7   ALBUMIN  --  3.1* 3.1*   MAGNESIUM 1.8 1.4* 2.2   PHOSPHORUS 2.6 3.2 3.9      Liver/Pancreas Enzyme Results Blood Gas     Recent Labs     02/09/23  2227 02/10/23  0512   TOTALPROTEIN 5.4* 5.5*   ALBUMIN 3.1* 3.1*   AST 23 23   ALT 19  21   ALKPHOS 73 77    Recent Labs     02/09/23  2227 02/10/23  0511   PH 7.54* 7.36      Cardiac Results    Coags Results     No results for input(s): "UHCEASTTROPI", "CKMB", "MBINDEX", "BNP" in the last 72 hours. Recent Labs     02/10/23  0512   INR 1.10          Imaging Studies:     Last Echo   01/31/23 EF 55-60% with grade 1 diastolic dysfunction and normal right ventricular size and function trivial MR and TR and no pericardial effusion.      LEFT HEART CATH  01/31/23  CORONARY ANATOMY  1.  Right coronary:  The right coronary artery is a small caliber vessel giving rise to PDA and posterolateral branches.  The right coronary artery, PDA, and posterolateral       branch have 20-30% diffuse disease.  2.  Left main coronary:  The left main bifurcates into the LAD and circumflex systems.  The left main has 0-5% diffuse disease.  3.  Left anterior descending coronary:  The LAD is a moderate size vessel system.  It has tandem mid-LAD in-stent restenosis.  Remainder of the vessel has 10-20% diffuse       disease.  4.  Circumflex coronary:  The circumflex gives rise to 3 OMs.  Circumflex proper, OM-1, OM-2, and OM-3 have 10-20% diffuse disease.    CONCLUSION  1.  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  2.  Mild circumflex and right coronary artery disease.  3.  Possible mild aortic stenosis on pullback gradient.  4.  Tobacco addiction/abuse, which was discussed at length the need to stop.    STRESS TEST/MYOCARDIAL PERFUSION IMAGING  01/31/23 abnormal with moderate area of ischemia in the septal segment of medium size.  There is moderate area of ischemia in the apical segment of very small size.  There is moderate area of ischemia in the inferobasal  segment of very small size.  There is evidence of small infarct noted in the septal, anteroseptal segment.  There is evidence of very small infarct noted in the apical, inferoseptal segment.  EF 68%.            Telemetry:  Sinus rhtyhm    Assessment/Plan:  Active Hospital Problems    Diagnosis    Primary Problem: Chest pain    S/P CABG x 1    Essential hypertension    Hyperlipidemia     Plan:  Check CXR.  Discussed hemoptysis with pulmonary care.  Hold off on anticoagulation for PAF due to hemoptysis.  Continue IV amiodarone for now.  Send sputum if able to obtain culture.  Watch electrolytes/ H and H and follow closely.        Roxan Diesel, APRN,FNP-BC    I have seen, examined and discussed patients history and current issues.  I agree with the charted information and examination by Roxan Diesel APRN-BC.  I have performed my own physical examination which is as follows:   AAO x 3  Heart RRR   Lungs clear  Abd N x 4, negative masses, nontender  Ext no edema  Femoral arteries are +1 bilateral and equal   No gross focal neurovascular deficits noted  DP/PT and  radials +1 bilaterally equal  More than 50% of the billable services performed this visit were performed by me.  This includes the time spent reviewing  chart, my nurse practitioners note and briefing, independent review of patients available labs and imaging, time spent in face to face encounter with patient and discussing test results.

## 2023-02-10 NOTE — Care Plan (Signed)
[facility]   Cardiac Rehab Phase 1  [address]  [place], [place] [address]      Cardiac Rehab Phase 1 Daily Note    [name]   [birth year]   Height: 157.5 cm (5' 2.01")  Weight: 65.4 kg (144 lb 2.9 oz)   294/A   Payor: [employer] MEDICARE / Plan: [employer] CHOICE PPO / Product Type: PPO /        Cardiac Rehab Orders Received and Chart Reviewed:      Cardiac Dx:  CABG      Pre HR 73  Pre BP 123/79  Pre O2 98% on 3L    Post HR 75  Post BP 110/75  Post O2 97% 3L    Activity Performed:  ROM exercises and sit to stand exercises.      Assessment:   Pt states she is tired today and that she went into Afib yesterday. She just finished a breathing treatment.      Education Provided:  Encouraged to ambulate with staff 4x daily.  Discussed the plan of care.      HEP given:     Comments:       Time Spent 45    Cardiac Rehab Phase 2 Referral Location:     Exclusion from Cardiac Rehab: No Transportation    Vilma Prader, EP-C

## 2023-02-11 ENCOUNTER — Inpatient Hospital Stay (HOSPITAL_COMMUNITY): Payer: Commercial Managed Care - PPO

## 2023-02-11 ENCOUNTER — Inpatient Hospital Stay (HOSPITAL_COMMUNITY): Payer: Self-pay

## 2023-02-11 LAB — BASIC METABOLIC PANEL
BUN/CREA RATIO: 35
BUN: 20 mg/dL (ref 8–23)
CALCIUM: 8.9 mg/dL (ref 8.3–10.7)
CHLORIDE: 104 mmol/L (ref 96–106)
CO2 TOTAL: 28 mmol/L (ref 22–30)
CREATININE: 0.57 mg/dL (ref 0.50–0.90)
ESTIMATED GFR: >90 mL/min/{1.73_m2} (ref >90)
GLUCOSE: 157 mg/dL — ABNORMAL HIGH (ref 74–109)
POTASSIUM: 3.7 mmol/L (ref 3.2–5.0)
SODIUM: 142 mmol/L (ref 133–144)

## 2023-02-11 LAB — POC BLOOD GLUCOSE (RESULTS)
GLUCOSE, POC: 124 mg/dl — ABNORMAL HIGH (ref 70–100)
GLUCOSE, POC: 141 mg/dl — ABNORMAL HIGH (ref 70–100)
GLUCOSE, POC: 129 mg/dl — ABNORMAL HIGH (ref 70–100)
GLUCOSE, POC: 156 mg/dl — ABNORMAL HIGH (ref 70–100)

## 2023-02-11 LAB — MAGNESIUM: MAGNESIUM: 1.6 mg/dL (ref 1.6–2.4)

## 2023-02-11 LAB — CBC
HCT: 25.1 % — ABNORMAL LOW (ref 34.8–46.0)
HGB: 8 g/dL — ABNORMAL LOW (ref 11.5–16.0)
MCH: 29.5 pg (ref 26.0–32.0)
MCHC: 31.9 g/dL (ref 31.0–35.5)
MCV: 92.6 fL (ref 78.0–100.0)
MPV: 9.7 fL (ref 8.7–12.5)
PLATELETS: 238 10*3/uL (ref 150–400)
RBC: 2.71 10*6/uL — ABNORMAL LOW (ref 3.85–5.22)
RDW-CV: 13.7 % (ref 11.5–15.5)
WBC: 9.5 10*3/uL (ref 3.7–11.0)

## 2023-02-11 LAB — PHOSPHORUS: PHOSPHORUS: 3.3 mg/dL (ref 2.5–4.5)

## 2023-02-11 MED ORDER — AMIODARONE 200 MG TABLET
400.0000 mg | ORAL_TABLET | Freq: Two times a day (BID) | ORAL | Status: DC
Start: 2023-02-11 — End: 2023-02-13
  Administered 2023-02-11 – 2023-02-13 (×4): 400 mg via ORAL
  Filled 2023-02-11 (×6): qty 2

## 2023-02-11 MED ORDER — OXYMETAZOLINE 0.05 % NASAL SPRAY
1.0000 | Freq: Two times a day (BID) | NASAL | Status: DC
Start: 2023-02-11 — End: 2023-02-11
  Administered 2023-02-11: 1 via NASAL
  Filled 2023-02-11: qty 30

## 2023-02-11 MED ORDER — NEOMYCIN-BACITRACN ZN-POLYMYX 3.5 MG-400 UNIT-5,000 UNIT/GRAM TOP OINT
TOPICAL_OINTMENT | Freq: Two times a day (BID) | CUTANEOUS | Status: DC | PRN
Start: 2023-02-11 — End: 2023-02-13
  Filled 2023-02-11: qty 28

## 2023-02-11 NOTE — Progress Notes (Addendum)
[place] [facility]    Cardiology Inpatient Progress Note    [name], [name], 67 y.o. female  Inpatient Admission Date: 01/28/2023   Date of Service: 02/07/2023   Date of Birth:  [birth year]  PCP:  Lillia Mountain, PA-C    Subjective:  This is a 67 year old female known to Dr Kirk Ruths with previous PCI to LAD in 2019 with 3 DES to LAD.       She came to [facility] ER with chest pain.  She had a heart cath 01/31/23 with Dr Kirk Ruths revealing:   CORONARY ANATOMY  1.  Right coronary:  The right coronary artery is a small caliber vessel giving rise to PDA and posterolateral branches.  The right coronary artery, PDA, and posterolateral       branch have 20-30% diffuse disease.  2.  Left main coronary:  The left main bifurcates into the LAD and circumflex systems.  The left main has 0-5% diffuse disease.  3.  Left anterior descending coronary:  The LAD is a moderate size vessel system.  It has tandem mid-LAD in-stent restenosis.  Remainder of the vessel has 10-20% diffuse       disease.  4.  Circumflex coronary:  The circumflex gives rise to 3 OMs.  Circumflex proper, OM-1, OM-2, and OM-3 have 10-20% diffuse disease.  CONCLUSION  1.  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  2.  Mild circumflex and right coronary artery disease.  3.  Possible mild aortic stenosis on pullback gradient.     She had an echo that showed good EF with no significant valvular heart disease and grade 1 diastolic dysfunction.    Less than 50% bilateral carotids.       She is now post CABG and feeling ok.  Had some nausea and vomiting this morning was unable to keep breakfast down.  Sitting in chair.  Low grade temp and mild tachycardia.  Labs ok.       02/08/23 She is feeling better today.  Less nausea.  Ate a little breakfast.  No temp.  EKG looked ok yesterday.  Chest tubes being pulled today.       02/09/23 felt sleepy with a little incisional discomfort and poor appetite but otherwise felt fine.        02/10/23 POD #3 LIMA to LAD Dr Osvaldo Angst - went into rapid atrial fib last night now on IV amiodarone.  Currently back in sinus rhythm.  Reports coughing up blood. Transfused last night.  Lungs sound adventitious and has productive cough.  Generally does not feel well today.       02/11/23 POD #4 states she generally feels weak and tired today.  Hemoptysis has slowed down.  Still having some dried blood from nasal cavity.  Humidified oxygen has helped.  Maintaining sinus on amiodarone.      Inpatient Meds:  acetaminophen (TYLENOL) tablet, 650 mg, Oral, Q4H PRN  amiodarone (CORDARONE) tablet, 400 mg, Oral, 2x/day-Food  anastrozole (ARIMIDEX) tablet, 1 mg, Oral, Daily  ascorbic acid (VITAMIN C) tablet, 500 mg, Oral, Daily  aspirin chewable tablet 81 mg, 81 mg, Oral, Daily  atorvastatin (LIPITOR) tablet, 40 mg, Oral, QPM  bisacodyl (DULCOLAX) rectal suppository, 10 mg, Rectal, Daily PRN  cholecalciferol (VITAMIN D3) 1000 unit (25 mcg) tablet, 1,000 Units, Oral, Daily  clonazePAM (klonoPIN) tablet, 0.5 mg, Oral, Q12H PRN  clopidogrel (PLAVIX) 75 mg tablet, 75 mg, Oral, Daily  Correction/SSIP insulin lispro (HumaLOG) 100 units/mL injection, 0-18 Units,  Subcutaneous, 4x/day AC  dextrose (GLUTOSE) 40% oral gel, 15 g, Oral, Q15 Min PRN  dextrose 50% (0.5 g/mL) injection - syringe, 12.5 g, Intravenous, Q15 Min PRN  ferrous sulfate 325 mg (elemental IRON 65 mg) enteric coated tablet, 325 mg, Oral, Daily before Breakfast  fluticasone (FLONASE) 50 mcg per spray nasal spray, 1 Spray, Each Nostril, Once PRN  glucagon (GLUCAGEN) injection 1 mg, 1 mg, IntraMUSCULAR, Once PRN  guaiFENesin (MUCINEX) extended release tablet - for cough (expectorant), 600 mg, Oral, 2x/day  HYDROmorphone (DILAUDID) 0.5 mg/0.5 mL injection, 0.2 mg, Intravenous, Q4H PRN  ipratropium (ATROVENT) 0.02% nebulizer solution, 0.5 mg, Nebulization, Q4H PRN  levalbuterol (XOPENEX) 1.25 mg/ 3 mL nebulizer solution, 1.25 mg, Nebulization, Q4H PRN  magnesium hydroxide (MILK  OF MAGNESIA) 400mg  per 5mL oral liquid, 30 mL, Oral, Daily PRN  metoprolol tartrate (LOPRESSOR) tablet, 25 mg, Oral, 2x/day  multivitamin (THERA) tablet, 1 Tablet, Oral, Daily  neomycin-bacitracin-polymyxin (NEOSPORIN) topical ointment, , Apply Topically, 2x/day PRN  nitroGLYCERIN (NITROSTAT) sublingual tablet, 0.4 mg, Sublingual, Q5 Min PRN  omega-3 fatty acids (LOVAZA) capsule, 1 g, Oral, Daily  ondansetron (ZOFRAN) 2 mg/mL injection, 4 mg, Intravenous, Q4H PRN  oxyCODONE (ROXICODONE) immediate release tablet, 5 mg, Oral, Q4H PRN  oxyCODONE (ROXICODONE) immediate release tablet, 10 mg, Oral, Q4H PRN  oxyCODONE-acetaminophen (PERCOCET) 5-325mg  per tablet, 1 Tablet, Oral, Q6H PRN  pantoprazole (PROTONIX) delayed release tablet, 40 mg, Oral, Daily  sennosides-docusate sodium (SENOKOT-S) 8.6-50mg  per tablet, 1 Tablet, Oral, 2x/day  sertraline (ZOLOFT) tablet, 100 mg, Oral, Daily  traZODone (DESYREL) tablet, 150 mg, Oral, NIGHTLY       No Known Allergies    Exam:  HEENT: PERLA  Neck No JVD or bruit  Lungs clear  Heart RRR no murmurs rubs or gallops appreciated  Abd N X 4  Ext no clubbing or cyanosis.  No edema  Neuro AAO x 3      Labs:    CBC Results Differential Results   Recent Labs     02/09/23  0555 02/09/23  2227 02/10/23  0953 02/11/23  1106   WBC 9.1  --  8.4 9.5   HGB 7.1* 6.7* 8.0* 8.0*   HCT 23.4* 21.8* 25.2* 25.1*    Recent Labs     02/10/23  0953   PMNS 72.9   MONOCYTES 6.9   BASOPHILS 0.5  <0.10   PMNABS 6.09   LYMPHSABS 1.39   MONOSABS 0.58   EOSABS 0.22      BMP Results Other Chemistries Results   Recent Labs     02/09/23  2227 02/10/23  0512 02/11/23  1106   SODIUM 139 143 142   POTASSIUM 3.3 4.1 3.7   CHLORIDE 101 105 104   CO2 30 32* 28   BUN 15 16 20    CREATININE 0.67 0.63 0.57   GFR >90 >90 >90    Recent Labs     02/09/23  2227 02/10/23  0512 02/11/23  1106   CALCIUM 8.7 8.7 8.9   ALBUMIN 3.1* 3.1*  --    MAGNESIUM 1.4* 2.2 1.6   PHOSPHORUS 3.2 3.9 3.3      Liver/Pancreas Enzyme Results Blood Gas      Recent Labs     02/09/23  2227 02/10/23  0512   TOTALPROTEIN 5.4* 5.5*   ALBUMIN 3.1* 3.1*   AST 23 23   ALT 19 21   ALKPHOS 73 77    No results found for this encounter   Cardiac Results  Coags Results     No results for input(s): "UHCEASTTROPI", "CKMB", "MBINDEX", "BNP" in the last 72 hours. Recent Labs     02/10/23  0512   INR 1.10          Imaging Studies:     Last Echo in [employer] prior to conversion  01/31/23 EF 55-60% with grade 1 diastolic dysfunction and normal right ventricular size and function trivial MR and TR and no pericardial effusion.     Narrative  **See full report in linked PDF document**  [facility]  [address] [place], [place], [place] [address]    Transthoracic Echocardiographic Report    ______________________________________________________________________________  Name: KARNE, TLATELPA                                 MRN: [id number]                Weight: 157 lb  Study Date: 02/10/2023 09:27 AM                         DOB: 1956/03/11             Height: 62 in  Gender: Female                                          Age: 62 yrs                 BSA: 1.7 m2  Accession #: [id number]                              BP: 84/54 mmHg  Patient Location: [facility]  Ordering Provider: Casimer Bilis  Tech: ZQ    ______________________________________________________________________________  Procedure:  Transthoracic complete echo, 2D, spectral and tissue Doppler, color flow Doppler, M-mode.    Quality:  The study images were of technically adequate quality.    Indications: Hypotension    Conclusions:    Findings  Left Ventricle:   Normal left ventricular size. Mildly depressed left ventricular systolic function. The left ventricular ejection fraction by visual assessment is estimated to be 45-50%. There is  global hypokinesis of the left ventricle. Left ventricular diastolic parameters are normal.  Right Ventricle:   Normal right ventricular size. Normal right  ventricular systolic function. Right ventricular systolic pressure is normal.  Left Atrium:   The left atrium is normal in size.  Right Atrium:   The right atrium is of normal size.  Mitral Valve:   The mitral valve is normal. No evidence of mitral stenosis. There is trace to mild mitral regurgitation.  Tricuspid Valve:   The tricuspid valve is normal. There is trace to mild tricuspid regurgitation. There is no evidence of tricuspid stenosis.  Aortic Valve:   The aortic valve is normal. No Aortic valve stenosis. There is no evidence of aortic regurgitation.  Pulmonic Valve:   The pulmonic valve is not well visualized. No evidence of pulmonic regurgitation. There is no evidence of pulmonic valve stenosis.  Atrial Septum:   The interatrial septum is normal in appearance.  IVC/Hepatic Veins:   The inferior vena cava was not visualized.  Aorta:   The aortic root is of normal size. The ascending aorta is normal in  size.  Pericardium/Pleural space:   No significant pericardial effusion demonstrated.    Electronically signed by: MD Casimer Bilis on 02/10/2023 02:33 PM       LEFT HEART CATH  01/31/23  CORONARY ANATOMY  1.  Right coronary:  The right coronary artery is a small caliber vessel giving rise to PDA and posterolateral branches.  The right coronary artery, PDA, and posterolateral       branch have 20-30% diffuse disease.  2.  Left main coronary:  The left main bifurcates into the LAD and circumflex systems.  The left main has 0-5% diffuse disease.  3.  Left anterior descending coronary:  The LAD is a moderate size vessel system.  It has tandem mid-LAD in-stent restenosis.  Remainder of the vessel has 10-20% diffuse       disease.  4.  Circumflex coronary:  The circumflex gives rise to 3 OMs.  Circumflex proper, OM-1, OM-2, and OM-3 have 10-20% diffuse disease.     CONCLUSION  1.  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  2.  Mild circumflex and right coronary artery disease.  3.  Possible mild aortic  stenosis on pullback gradient.  4.  Tobacco addiction/abuse, which was discussed at length the need to stop.     STRESS TEST/MYOCARDIAL PERFUSION IMAGING  01/31/23 abnormal with moderate area of ischemia in the septal segment of medium size.  There is moderate area of ischemia in the apical segment of very small size.  There is moderate area of ischemia in the inferobasal segment of very small size.  There is evidence of small infarct noted in the septal, anteroseptal segment.  There is evidence of very small infarct noted in the apical, inferoseptal segment.  EF 68%.        Telemetry:  Sinus rhythm rate of 70    Assessment/Plan:  Active Hospital Problems    Diagnosis    Primary Problem: Chest pain    Prediabetes    S/P CABG x 1    Essential hypertension    Hyperlipidemia     Plan:  Deferred anticoag with hemoptysis.  H/H stable.  Transitioned to PO amiodarone.  Given lasix by pulmonary with some improvement in her breathing.  Gradually improving.  Continue current treatment and monitor.       Roxan Diesel, APRN,FNP-BC    I have seen, examined and discussed patients history and current issues.  I agree with the charted information and examination by Roxan Diesel APRN-BC.  I have performed my own physical examination which is as follows:   AAO x 3  Heart RRR   Lungs clear  Abd N x 4, negative masses, nontender  Ext no edema  Femoral arteries are +1 bilateral and equal   No gross focal neurovascular deficits noted  DP/PT and  radials +1 bilaterally equal  More than 50% of the billable services performed this visit were performed by me.  This includes the time spent reviewing chart, my nurse practitioners note and briefing, independent review of patients available labs and imaging, time spent in face to face encounter with patient and discussing test results.

## 2023-02-11 NOTE — Care Plan (Signed)
[place] [facility]    Problem: Adult Inpatient Plan of Care  Goal: Patient-Specific Goal (Individualized)  Outcome: Ongoing (see interventions/notes)  Flowsheets  Taken 02/11/2023 1318  Patient would like to participate in bedside shift report: Yes  Taken 02/11/2023 0700  Patient would like to participate in bedside shift report: Yes  Individualized Care Needs: none  Patient-Specific Goals (Include Timeframe): Ambulate 3x/day  Plan of Care Reviewed With: patient   Care Plan Note    Situation:  Pt s/p CABG with JP drain in place.     Intervention: Assisted pt with ambulation TID    Response: Pt tolerated ambulation without reports of pain.       Jerene Dilling, RN

## 2023-02-11 NOTE — Progress Notes (Signed)
[facility]  Progress Note    [name]  Date of service: 02/11/2023   Date of Admission:  01/28/2023    Chief Complaint: Shortness of breath    Subjective:   Ms. Schlomer is a 67 year old female who initially presented to the hospital on 04/20 secondary to headache with associated epigastric pain and hypertension.  Did have episode of chest pain that resolved spontaneously but this headache has been ongoing since.  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  We are consulted postoperatively for critical care management.  [name] still drowsy postprocedure but has been extubated.  Per report is a current everyday smoker.  Unable to obtain full HPI/ROS.     4/30:  Awake and alert.  On 2 L of oxygen.  Has been out of bed most of the morning.  Had several episodes of nausea and vomiting overnight for which she received IV fluid.  Nausea a little better this a.m. but still not much appetite.  Was able to eat a few crackers.  She denies shortness of breath or fever.  She does have postoperative surgical discomfort that has been well managed with analgesia.     5/1:  Up in chair currently on 2 L nasal cannula satting well with minimal shortness of breath.  Still having some issues with deep breath however getting chest tubes and wires out today.     5/2:  Up in chair currently on 2 L nasal cannula.  Shortness of breath improving.  Does not have home O2.  Chest tubes and wires have been removed, still with JP drain in place.  Progressing well.     5/3:  Awake and alert.  On 2 L of oxygen.  Denies current shortness of breath, chest pain, fever, nausea vomiting.  Had an episode of shortness of breath, chest tightness,  and then coughed up bloody material during the night.  Nursing reports the development of rapid AFib for which she was placed on amiodarone drip.  She was currently in sinus rhythm.    5/4:  [name] sitting up in bedside chair.  She is currently on room air.  She reports that she has been having some epistasis but no hemoptysis.  She continues to use her IS.  Breathing feels better today.  She was given a 1 time dose of Lasix yesterday.    Vital Signs:  Temp  Avg: 37.1 C (98.7 F)  Min: 36.8 C (98.3 F)  Max: 37.7 C (99.8 F)    Pulse  Avg: 79  Min: 76  Max: 82 BP  Min: 103/75  Max: 136/78   Resp  Avg: 20  Min: 14  Max: 26 SpO2  Avg: 94.3 %  Min: 93 %  Max: 95 %          Medications:   acetaminophen (TYLENOL) tablet, 650 mg, Oral, Q4H PRN  amiodarone (CORDARONE) tablet, 400 mg, Oral, 2x/day-Food  anastrozole (ARIMIDEX) tablet, 1 mg, Oral, Daily  ascorbic acid (VITAMIN C) tablet, 500 mg, Oral, Daily  aspirin chewable tablet 81 mg, 81 mg, Oral, Daily  atorvastatin (LIPITOR) tablet, 40 mg, Oral, QPM  bisacodyl (DULCOLAX) rectal suppository, 10 mg, Rectal, Daily  PRN  cholecalciferol (VITAMIN D3) 1000 unit (25 mcg) tablet, 1,000 Units, Oral, Daily  clonazePAM (klonoPIN) tablet, 0.5 mg, Oral, Q12H PRN  clopidogrel (PLAVIX) 75 mg tablet, 75 mg, Oral, Daily  Correction/SSIP insulin lispro (HumaLOG) 100 units/mL injection, 0-18 Units, Subcutaneous, 4x/day AC  dextrose (GLUTOSE) 40% oral gel, 15 g, Oral, Q15 Min PRN  dextrose 50% (0.5 g/mL) injection - syringe, 12.5 g, Intravenous, Q15 Min PRN  ferrous sulfate 325 mg (elemental IRON 65 mg) enteric coated tablet, 325 mg, Oral, Daily before Breakfast  fluticasone (FLONASE) 50 mcg per spray nasal spray, 1 Spray, Each Nostril, Once PRN  glucagon (GLUCAGEN) injection 1 mg, 1 mg, IntraMUSCULAR, Once PRN  guaiFENesin (MUCINEX) extended release tablet - for cough (expectorant), 600 mg, Oral, 2x/day  HYDROmorphone (DILAUDID) 0.5 mg/0.5 mL injection, 0.2 mg, Intravenous, Q4H  PRN  ipratropium (ATROVENT) 0.02% nebulizer solution, 0.5 mg, Nebulization, Q4H PRN  levalbuterol (XOPENEX) 1.25 mg/ 3 mL nebulizer solution, 1.25 mg, Nebulization, Q4H PRN  magnesium hydroxide (MILK OF MAGNESIA) 400mg  per 5mL oral liquid, 30 mL, Oral, Daily PRN  metoprolol tartrate (LOPRESSOR) tablet, 25 mg, Oral, 2x/day  multivitamin (THERA) tablet, 1 Tablet, Oral, Daily  neomycin-bacitracin-polymyxin (NEOSPORIN) topical ointment, , Apply Topically, 2x/day PRN  nitroGLYCERIN (NITROSTAT) sublingual tablet, 0.4 mg, Sublingual, Q5 Min PRN  omega-3 fatty acids (LOVAZA) capsule, 1 g, Oral, Daily  ondansetron (ZOFRAN) 2 mg/mL injection, 4 mg, Intravenous, Q4H PRN  oxyCODONE (ROXICODONE) immediate release tablet, 5 mg, Oral, Q4H PRN  oxyCODONE (ROXICODONE) immediate release tablet, 10 mg, Oral, Q4H PRN  oxyCODONE-acetaminophen (PERCOCET) 5-325mg  per tablet, 1 Tablet, Oral, Q6H PRN  pantoprazole (PROTONIX) delayed release tablet, 40 mg, Oral, Daily  sennosides-docusate sodium (SENOKOT-S) 8.6-50mg  per tablet, 1 Tablet, Oral, 2x/day  sertraline (ZOLOFT) tablet, 100 mg, Oral, Daily  traZODone (DESYREL) tablet, 150 mg, Oral, NIGHTLY        Physical Exam:    GENERAL: Awake and alert. No acute distress noted.   SKIN: Warm and dry.  HEAD: Atraumatic, normocephalic.  EYES: Pupils equal and reactive. EOMI.  EARS AND NOSE: Normal to examination.  NECK: Supple, Trachea midline.  CARDIAC: Regular rate and rhythm.  LUNGS: Clear to auscultation bilaterally. No wheezes or rhonchi. No accessory muscle use noted.  GI: Abdomen soft, nontender. Good bowel sounds x 4.   MUSCULOSKELETAL: Good muscle strength throughout.  EXTREMITIES: No pedal edema noted. Distal pulses equal bilaterally.  NEUROLOGICAL: Cranial nerves grossly intact. No gross focal deficit.  PSYCHIATRIC: Appropriate mood and affect.     RESPIRATORY REQUIREMENTS:  Room air    Labs:     Results for orders placed or performed during the hospital encounter of 01/28/23 (from the  past 24 hour(s))   POC BLOOD GLUCOSE (RESULTS)   Result Value Ref Range    GLUCOSE, POC 123 (H) 70 - 100 mg/dl   POC BLOOD GLUCOSE (RESULTS)   Result Value Ref Range    GLUCOSE, POC 174 (H) 70 - 100 mg/dl   POC BLOOD GLUCOSE (RESULTS)   Result Value Ref Range    GLUCOSE, POC 124 (H) 70 - 100 mg/dl   BASIC METABOLIC PANEL   Result Value Ref Range    SODIUM 142 133 - 144 mmol/L    POTASSIUM 3.7 3.2 - 5.0 mmol/L    CHLORIDE 104 96 - 106 mmol/L    CO2 TOTAL 28 22 - 30 mmol/L    CALCIUM 8.9 8.3 - 10.7 mg/dL    GLUCOSE 952 (H) 74 - 109 mg/dL  BUN 20 8 - 23 mg/dL    CREATININE 1.61 0.96 - 0.90 mg/dL    BUN/CREA RATIO 35     ESTIMATED GFR >90 >90 mL/min/1.10m^2   PHOSPHORUS   Result Value Ref Range    PHOSPHORUS 3.3 2.5 - 4.5 mg/dL   MAGNESIUM   Result Value Ref Range    MAGNESIUM 1.6 1.6 - 2.4 mg/dL   CBC   Result Value Ref Range    WBC 9.5 3.7 - 11.0 x10^3/uL    RBC 2.71 (L) 3.85 - 5.22 x10^6/uL    HGB 8.0 (L) 11.5 - 16.0 g/dL    HCT 04.5 (L) 40.9 - 46.0 %    MCV 92.6 78.0 - 100.0 fL    MCH 29.5 26.0 - 32.0 pg    MCHC 31.9 31.0 - 35.5 g/dL    RDW-CV 81.1 91.4 - 78.2 %    PLATELETS 238 150 - 400 x10^3/uL    MPV 9.7 8.7 - 12.5 fL   POC BLOOD GLUCOSE (RESULTS)   Result Value Ref Range    GLUCOSE, POC 141 (H) 70 - 100 mg/dl        Radiology:   Results for orders placed or performed during the hospital encounter of 01/28/23 (from the past 72 hour(s))   XR AP MOBILE CHEST     Status: None    Narrative    Zooey JEAN Empie    XR AP MOBILE CHEST performed on 02/09/2023 5:30 AM.    INDICATION:  post chest tube removal   Additional History:  post chest tube removal    TECHNIQUE:  Single portable frontal view of the chest.  1 views/1 images submitted for interpretation.    COMPARISON:  Feb 08, 2023  ___________________________________  FINDINGS:    Central venous catheter and chest tubes have been removed. No visible pneumothorax. Persistent opacity at the left base may relate any combination of atelectasis, infiltrate and/or pleural  effusion. Minimal atelectasis and/or infiltrate now evident the right base.      Impression    Removal of support lines as noted. New mild atelectasis and/or infiltrate at the right base. Stable findings on the left.        Radiologist location ID: [id number]     XR AP MOBILE CHEST     Status: None    Narrative    Darrell JEAN Rudie    XR AP MOBILE CHEST performed on 02/10/2023 10:33 AM.    INDICATION:  abnormal lung sounds   Additional History:  difficulty breathing, post op    TECHNIQUE:  Single portable frontal view of the chest.  1 views/1 images submitted for interpretation.    COMPARISON:  Feb 09, 2023  ___________________________________  FINDINGS:    Small left pleural effusion as before. Left basilar atelectasis and/or infiltrate increasingly obscures the left heart border suggesting some progression in the lingula.      Impression    Increasing infiltrate and/or atelectasis in the lingula obscuring the left heart border. Left pleural effusion as before.        Radiologist location ID: [id number]     XR AP MOBILE CHEST     Status: None    Narrative    Maite JEAN Meader    XR AP MOBILE CHEST performed on 02/11/2023 8:50 AM.    INDICATION:  left effusion   Additional History:  left effusion, recent CABG, current smoker, hx of cardiac stent, bilateral mastectomy, chronic lung disease, CAD, htn and chronic bronchitis  TECHNIQUE:  Single portable frontal view of the chest.  1 views/1 images submitted for interpretation.    COMPARISON:  Prior days exam  ___________________________________  FINDINGS:    Left lower lobe retrocardiac atelectasis or infiltrate slightly increased. Estimated small right and trace left pleural effusion. Right appears increased. Left appears improved. There is minimal right basilar probable atelectasis increased. No pneumothorax on either side. Heart size is within normal limits. Vascularity appears mildly to moderately congested slightly increased.    There appears to been a nasogastric  tube present previously not evident on current film. There are postop changes of sternum and cervical spine. Surgical clips overlie the breasts and/or axillary regions.  ___________________________________    Impression    1. Multiple findings as described above.        Radiologist location ID: [id number]         Assessment:  Active Hospital Problems    Diagnosis    Primary Problem: Chest pain    Prediabetes    S/P CABG x 1    Essential hypertension    Hyperlipidemia        Plan:   Continue to monitor for any recurrent hemoptysis.  Currently oxygenating well on room air.  Supplemental O2 if needed.  Continue to monitor hemoglobin.  Chest x-ray today was independently reviewed.  Effusions bilaterally with moderate vascular congestion.  She is currently doing well on room air.  Continue bronchodilators and ICS.  She has a history of chronic obstructive pulmonary disease with an FEV1 of 57%.  Continue incentive spirometer hourly.    Patient was seen and examined with Dr. Marina Goodell.  He performed the examined formulated this plan.  He will make any further orders and recommendations to his addendum.    Evalyn Casco, APRN-FNP-BC    This note may have been partially generated using MModal Fluency Direct system, and there may be some incorrect words, spellings, and punctuation that were not noted in checking the note before saving.    I personally saw and evaluated the patient as part of a shared service with an APP.    My substantive findings are:  MDM (complete) greater than 50% of medical decision-making by me personally.  I have addressed 1 or more acute illness requiring hospitalization and or causing organ dysfunction including:  Status post bypass surgery hypertension hyperlipidemia     [name] currently oxygenating well on room air    Chest x-ray independently viewed effusions bilaterally with moderate vascular congestion currently doing well on room air recommend optimize diuresis     No more hemoptysis at  this time     Continue bronchodilators inhaled corticosteroids.    We will follow up closely.    Dawna Part III, DO  02/11/2023 13:35     (

## 2023-02-11 NOTE — Progress Notes (Signed)
[facility]  Cardiac Surgery   Floor/Stepdown Progress Note      Efrat, Jenson  Date of Admission:  01/28/2023  Date of Birth:  [birth year]    Hospital Day:  LOS: 14 days   Post-op Day:   , S/P  coronary artery bypass graft x1  Date of Service:  02/11/2023    SUBJECTIVE:  She had episodes of epistaxis last evening.  These have resolved.  Humidified oxygen was added.  She has no other complaints this morning.  She has remained in sinus rhythm overnight.    OBJECTIVE:    Vital Signs:  Temp (24hrs) Max:37.7 C (99.8 F)      Temperature: 36.9 C (98.4 F) (02/11/23 0700)  BP (Non-Invasive): 103/75 (02/11/23 0825)  MAP (Non-Invasive): 87 mmHG (02/11/23 0300)  Heart Rate: 78 (02/11/23 0825)  Respiratory Rate: 15 (02/11/23 0700)  SpO2: 95 % (02/11/23 0700)    Base (Admission) Weight:  Weight: 71.2 kg (157 lb)  Weight:  Weight: 67.8 kg (149 lb 7.6 oz)    Current Inpatient Medications:  acetaminophen (TYLENOL) tablet, 650 mg, Oral, Q4H PRN  amiodarone (NEXTERONE) 360 mg in D5W premix infusion, 1 mg/min, Intravenous, Continuous  anastrozole (ARIMIDEX) tablet, 1 mg, Oral, Daily  ascorbic acid (VITAMIN C) tablet, 500 mg, Oral, Daily  aspirin chewable tablet 81 mg, 81 mg, Oral, Daily  atorvastatin (LIPITOR) tablet, 40 mg, Oral, QPM  bisacodyl (DULCOLAX) rectal suppository, 10 mg, Rectal, Daily PRN  cholecalciferol (VITAMIN D3) 1000 unit (25 mcg) tablet, 1,000 Units, Oral, Daily  clonazePAM (klonoPIN) tablet, 0.5 mg, Oral, Q12H PRN  clopidogrel (PLAVIX) 75 mg tablet, 75 mg, Oral, Daily  Correction/SSIP insulin lispro (HumaLOG) 100 units/mL injection, 0-18 Units, Subcutaneous, 4x/day AC  dextrose (GLUTOSE) 40% oral gel, 15 g, Oral, Q15 Min PRN  dextrose 50% (0.5 g/mL) injection - syringe, 12.5 g, Intravenous, Q15 Min PRN  ferrous sulfate 325 mg (elemental IRON 65 mg) enteric coated tablet, 325 mg, Oral, Daily before Breakfast  fluticasone (FLONASE) 50 mcg per spray nasal spray, 1 Spray, Each Nostril, Once  PRN  glucagon (GLUCAGEN) injection 1 mg, 1 mg, IntraMUSCULAR, Once PRN  guaiFENesin (MUCINEX) extended release tablet - for cough (expectorant), 600 mg, Oral, 2x/day  HYDROmorphone (DILAUDID) 0.5 mg/0.5 mL injection, 0.2 mg, Intravenous, Q4H PRN  ipratropium (ATROVENT) 0.02% nebulizer solution, 0.5 mg, Nebulization, Q4H PRN  levalbuterol (XOPENEX) 1.25 mg/ 3 mL nebulizer solution, 1.25 mg, Nebulization, Q4H PRN  magnesium hydroxide (MILK OF MAGNESIA) 400mg  per 5mL oral liquid, 30 mL, Oral, Daily PRN  metoprolol tartrate (LOPRESSOR) tablet, 25 mg, Oral, 2x/day  multivitamin (THERA) tablet, 1 Tablet, Oral, Daily  nitroGLYCERIN (NITROSTAT) sublingual tablet, 0.4 mg, Sublingual, Q5 Min PRN  omega-3 fatty acids (LOVAZA) capsule, 1 g, Oral, Daily  ondansetron (ZOFRAN) 2 mg/mL injection, 4 mg, Intravenous, Q4H PRN  oxyCODONE (ROXICODONE) immediate release tablet, 5 mg, Oral, Q4H PRN  oxyCODONE (ROXICODONE) immediate release tablet, 10 mg, Oral, Q4H PRN  oxyCODONE-acetaminophen (PERCOCET) 5-325mg  per tablet, 1 Tablet, Oral, Q6H PRN  pantoprazole (PROTONIX) delayed release tablet, 40 mg, Oral, Daily  sennosides-docusate sodium (SENOKOT-S) 8.6-50mg  per tablet, 1 Tablet, Oral, 2x/day  sertraline (ZOLOFT) tablet, 100 mg, Oral, Daily  traZODone (DESYREL) tablet, 150 mg, Oral, NIGHTLY        Appropriate Home Meds restarted:  Yes    I/O:  I/O last 24 hours to current time:    Intake/Output Summary (Last 24 hours) at 02/11/2023 0934  Last data filed at 02/11/2023 0400  Gross per 24 hour   Intake 333.12 ml   Output 500 ml   Net -166.88 ml     I/O last 3 completed shifts:  In: 516.77 [P.O.:150; I.V.:366.77]  Out: 950 [Urine:900; Drains:50]    Nutrition/Diet:  DIET DIABETIC CARDIAC Carb Amount: CCD4 = 60 Grams  DIETARY SUPPLEMENTS Glucerna Shake - Chocolate; BREAKFAST; 1 Each    Hardware (foley):   Date Placed Necessity Reviewed  Date Discontinued    Foley (out day number two)                      Labs:  Reviewed:  Lab Results Today:     Results for orders placed or performed during the hospital encounter of 01/28/23 (from the past 24 hour(s))   CBC WITH DIFF   Result Value Ref Range    WBC 8.4 3.7 - 11.0 x10^3/uL    RBC 2.71 (L) 3.85 - 5.22 x10^6/uL    HGB 8.0 (L) 11.5 - 16.0 g/dL    HCT 40.3 (L) 47.4 - 46.0 %    MCV 93.0 78.0 - 100.0 fL    MCH 29.5 26.0 - 32.0 pg    MCHC 31.7 31.0 - 35.5 g/dL    RDW-CV 25.9 56.3 - 87.5 %    PLATELETS 178 150 - 400 x10^3/uL    MPV 9.7 8.7 - 12.5 fL    NEUTROPHIL % 72.9 %    LYMPHOCYTE % 16.6 %    MONOCYTE % 6.9 %    EOSINOPHIL % 2.6 %    BASOPHIL % 0.5 %    NEUTROPHIL # 6.09 1.50 - 7.70 x10^3/uL    LYMPHOCYTE # 1.39 1.00 - 4.80 x10^3/uL    MONOCYTE # 0.58 0.20 - 1.10 x10^3/uL    EOSINOPHIL # 0.22 <=0.50 x10^3/uL    BASOPHIL # <0.10 <=0.20 x10^3/uL    IMMATURE GRANULOCYTE % 0.5 0.0 - 1.0 %    IMMATURE GRANULOCYTE # <0.10 <0.10 x10^3/uL   SPUTUM SCREEN    Specimen: Sputum   Result Value Ref Range    SPUTUM SCREEN       Reject for Culture, <=25 PMNs per low power microscopic field (10x objective lens)   POC BLOOD GLUCOSE (RESULTS)   Result Value Ref Range    GLUCOSE, POC 184 (H) 70 - 100 mg/dl   POC BLOOD GLUCOSE (RESULTS)   Result Value Ref Range    GLUCOSE, POC 123 (H) 70 - 100 mg/dl   POC BLOOD GLUCOSE (RESULTS)   Result Value Ref Range    GLUCOSE, POC 174 (H) 70 - 100 mg/dl   POC BLOOD GLUCOSE (RESULTS)   Result Value Ref Range    GLUCOSE, POC 124 (H) 70 - 100 mg/dl     Ordered:  N/A  Anticoagulation:  No  Target INR:  N/A   Follow Up Arranged:   N/A    Radiology:    Reviewed:  No results found.  Ordered:  N/A  ECHO (day 5 valves):  N/A    Microbiology:  N/A    Physical Exam:    Cardiovascular:  regular rate and rhythm, S1, S2 normal, no murmur, click, rub or gallop  Lungs:  Clear to auscultation bilaterally.    ASSESSMENT/PLAN:  She remains in sinus rhythm.  I will transition to oral amiodarone and discontinue the infusion.  She had another episode of epistaxis last evening.  Humidified oxygen has been added.   I will order Neosporin ointment to the left nares.  Continue to monitor.  Wean oxygen as tolerated.  Continue mobilization.  Hopefully home by Monday.    Problem List:  Active Hospital Problems    Diagnosis    Primary Problem: Chest pain    Prediabetes    S/P CABG x 1    Essential hypertension    Hyperlipidemia       PT/OT: Yes    DVT RISK FACTORS HAVE BEN ASSESSED AND PROPHYLAXIS ORDERED (SEE RUBYONLINE - REFERENCE TOOLS - MD, DVT PROPHY OR POCKET CARD):  YES    Disposition Planning: Home discharge      [facility], MD 02/11/2023 09:34

## 2023-02-12 DIAGNOSIS — Z7902 Long term (current) use of antithrombotics/antiplatelets: Secondary | ICD-10-CM

## 2023-02-12 DIAGNOSIS — J449 Chronic obstructive pulmonary disease, unspecified: Secondary | ICD-10-CM

## 2023-02-12 DIAGNOSIS — I97190 Other postprocedural cardiac functional disturbances following cardiac surgery: Secondary | ICD-10-CM

## 2023-02-12 DIAGNOSIS — I480 Paroxysmal atrial fibrillation: Secondary | ICD-10-CM | POA: Diagnosis not present

## 2023-02-12 DIAGNOSIS — J90 Pleural effusion, not elsewhere classified: Secondary | ICD-10-CM

## 2023-02-12 LAB — POC BLOOD GLUCOSE (RESULTS)
GLUCOSE, POC: 130 mg/dl — ABNORMAL HIGH (ref 70–100)
GLUCOSE, POC: 128 mg/dl — ABNORMAL HIGH (ref 70–100)
GLUCOSE, POC: 162 mg/dl — ABNORMAL HIGH (ref 70–100)
GLUCOSE, POC: 117 mg/dl — ABNORMAL HIGH (ref 70–100)
GLUCOSE, POC: 158 mg/dl — ABNORMAL HIGH (ref 70–100)

## 2023-02-12 MED ORDER — FUROSEMIDE 10 MG/ML INJECTION SOLUTION
40.0000 mg | Freq: Once | INTRAMUSCULAR | Status: AC
Start: 2023-02-12 — End: 2023-02-12
  Administered 2023-02-12: 40 mg via INTRAVENOUS
  Filled 2023-02-12: qty 4

## 2023-02-12 NOTE — Nurses Notes (Signed)
Reassessment complete. No changes other than documented.

## 2023-02-12 NOTE — Consults (Addendum)
[facility]   Cardiology Consult           Date of Service:  02/12/23 10:39   [name], [name], 67 y.o. female  Date of Admission:  01/28/2023  Date of Birth:  [birth year]      Subjective/HPI:  Patient without complaints chest pain, palpitations, dyspnea    Medications:  acetaminophen (TYLENOL) tablet, 650 mg, Oral, Q4H PRN  amiodarone (CORDARONE) tablet, 400 mg, Oral, 2x/day-Food  anastrozole (ARIMIDEX) tablet, 1 mg, Oral, Daily  ascorbic acid (VITAMIN C) tablet, 500 mg, Oral, Daily  aspirin chewable tablet 81 mg, 81 mg, Oral, Daily  atorvastatin (LIPITOR) tablet, 40 mg, Oral, QPM  bisacodyl (DULCOLAX) rectal suppository, 10 mg, Rectal, Daily PRN  cholecalciferol (VITAMIN D3) 1000 unit (25 mcg) tablet, 1,000 Units, Oral, Daily  clonazePAM (klonoPIN) tablet, 0.5 mg, Oral, Q12H PRN  clopidogrel (PLAVIX) 75 mg tablet, 75 mg, Oral, Daily  Correction/SSIP insulin lispro (HumaLOG) 100 units/mL injection, 0-18 Units, Subcutaneous, 4x/day AC  dextrose (GLUTOSE) 40% oral gel, 15 g, Oral, Q15 Min PRN  dextrose 50% (0.5 g/mL) injection - syringe, 12.5 g, Intravenous, Q15 Min PRN  ferrous sulfate 325 mg (elemental IRON 65 mg) enteric coated tablet, 325 mg, Oral, Daily before Breakfast  fluticasone (FLONASE) 50 mcg per spray nasal spray, 1 Spray, Each Nostril, Once PRN  glucagon (GLUCAGEN) injection 1 mg, 1 mg, IntraMUSCULAR, Once PRN  guaiFENesin (MUCINEX) extended release tablet - for cough (expectorant), 600 mg, Oral, 2x/day  HYDROmorphone (DILAUDID) 0.5 mg/0.5 mL injection, 0.2 mg, Intravenous, Q4H PRN  ipratropium (ATROVENT) 0.02% nebulizer solution, 0.5 mg, Nebulization, Q4H PRN  levalbuterol (XOPENEX) 1.25 mg/ 3 mL nebulizer solution, 1.25 mg, Nebulization, Q4H PRN  magnesium hydroxide (MILK OF MAGNESIA) 400mg  per 5mL oral liquid, 30 mL, Oral, Daily PRN  metoprolol tartrate (LOPRESSOR) tablet, 25 mg, Oral, 2x/day  multivitamin (THERA) tablet, 1 Tablet, Oral, Daily  neomycin-bacitracin-polymyxin  (NEOSPORIN) topical ointment, , Apply Topically, 2x/day PRN  nitroGLYCERIN (NITROSTAT) sublingual tablet, 0.4 mg, Sublingual, Q5 Min PRN  omega-3 fatty acids (LOVAZA) capsule, 1 g, Oral, Daily  ondansetron (ZOFRAN) 2 mg/mL injection, 4 mg, Intravenous, Q4H PRN  oxyCODONE (ROXICODONE) immediate release tablet, 5 mg, Oral, Q4H PRN  oxyCODONE (ROXICODONE) immediate release tablet, 10 mg, Oral, Q4H PRN  oxyCODONE-acetaminophen (PERCOCET) 5-325mg  per tablet, 1 Tablet, Oral, Q6H PRN  pantoprazole (PROTONIX) delayed release tablet, 40 mg, Oral, Daily  sennosides-docusate sodium (SENOKOT-S) 8.6-50mg  per tablet, 1 Tablet, Oral, 2x/day  sertraline (ZOLOFT) tablet, 100 mg, Oral, Daily  traZODone (DESYREL) tablet, 150 mg, Oral, NIGHTLY         Allergies:  No Known Allergies    Review of systems:  All pertinent review of systems as addresses and detailed in HPI above     Physical Examination:  Patient Vitals for the past 24 hrs:   BP Temp Pulse Resp SpO2 Weight   02/12/23 0700 107/66 36.3 C (97.4 F) 75 18 97 % --   02/12/23 0600 -- -- -- -- -- 67.7 kg (149 lb 4 oz)   02/12/23 0300 127/77 37.6 C (99.6 F) 81 19 92 % --   02/11/23 2300 118/70 37.6 C (99.6 F) 69 13 90 % --   02/11/23 2216 -- -- -- -- 94 % --   02/11/23 1900 114/67 37.3 C (99.1 F) 85 19 95 % --   02/11/23 1500 108/80 37.3 C (99.2 F) 77 18 91 % --   02/11/23 1100 120/73 36.8 C (98.3 F) 76 20 93 % --  General: Awake. Well nourished. No acute distress. A&O x 3.  HEENT: Normocephalic, atraumatic. PERRL. Normal hearing.  Neck: No JVD or bruit. Trachea midline.  Lungs: Clear to auscultation. No rhonchi or wheezing noted. Unlabored.  Heart: Regular rate and rhythm. No murmurs, rubs, or gallops are appreciated.  Abdomen: BS normal x 4 quadrants. Soft, non-tender, non-distended.  Extremities: No clubbing, cyanosis, or edema.  Psychiatric: Cooperative, appropriate mood and affect.  Musculoskeletal: Strength grips are equal. No red, swollen joints. MAE.  Skin:  Warm, dry, and intact. No rashes or hives are noted.       EKG/Telemetry:  SR, rate 74bpm       Labs:  Results for orders placed or performed during the hospital encounter of 01/28/23 (from the past 24 hour(s))   BASIC METABOLIC PANEL   Result Value Ref Range    SODIUM 142 133 - 144 mmol/L    POTASSIUM 3.7 3.2 - 5.0 mmol/L    CHLORIDE 104 96 - 106 mmol/L    CO2 TOTAL 28 22 - 30 mmol/L    CALCIUM 8.9 8.3 - 10.7 mg/dL    GLUCOSE 098 (H) 74 - 109 mg/dL    BUN 20 8 - 23 mg/dL    CREATININE 1.19 1.47 - 0.90 mg/dL    BUN/CREA RATIO 35     ESTIMATED GFR >90 >90 mL/min/1.53m^2   PHOSPHORUS   Result Value Ref Range    PHOSPHORUS 3.3 2.5 - 4.5 mg/dL   MAGNESIUM   Result Value Ref Range    MAGNESIUM 1.6 1.6 - 2.4 mg/dL   CBC   Result Value Ref Range    WBC 9.5 3.7 - 11.0 x10^3/uL    RBC 2.71 (L) 3.85 - 5.22 x10^6/uL    HGB 8.0 (L) 11.5 - 16.0 g/dL    HCT 82.9 (L) 56.2 - 46.0 %    MCV 92.6 78.0 - 100.0 fL    MCH 29.5 26.0 - 32.0 pg    MCHC 31.9 31.0 - 35.5 g/dL    RDW-CV 13.0 86.5 - 78.4 %    PLATELETS 238 150 - 400 x10^3/uL    MPV 9.7 8.7 - 12.5 fL   POC BLOOD GLUCOSE (RESULTS)   Result Value Ref Range    GLUCOSE, POC 141 (H) 70 - 100 mg/dl   POC BLOOD GLUCOSE (RESULTS)   Result Value Ref Range    GLUCOSE, POC 129 (H) 70 - 100 mg/dl   POC BLOOD GLUCOSE (RESULTS)   Result Value Ref Range    GLUCOSE, POC 156 (H) 70 - 100 mg/dl   POC BLOOD GLUCOSE (RESULTS)   Result Value Ref Range    GLUCOSE, POC 130 (H) 70 - 100 mg/dl   POC BLOOD GLUCOSE (RESULTS)   Result Value Ref Range    GLUCOSE, POC 128 (H) 70 - 100 mg/dl        Laboratory results were reviewed.      Imaging Studies:   Last Echo  Results for orders placed during the hospital encounter of 01/28/23    TRANSTHORACIC ECHOCARDIOGRAM - ADULT 02/10/2023  2:33 PM    Narrative  **See full report in linked PDF document**  [facility]  [address] [place], [place], [place] [address]    Transthoracic Echocardiographic  Report    ______________________________________________________________________________  Name: [name], [name]  MRN: [id number]                Weight: 157 lb  Study Date: 02/10/2023 09:27 AM                         DOB: 08-Jul-1956             Height: 62 in  Gender: Female                                          Age: 48 yrs                 BSA: 1.7 m2  Accession #: [id number]                              BP: 84/54 mmHg  Patient Location: [facility] ECHOCARDIOGRAM [facility]  Ordering Provider: Casimer Bilis  Tech: ZQ    ______________________________________________________________________________  Procedure:  Transthoracic complete echo, 2D, spectral and tissue Doppler, color flow Doppler, M-mode.    Quality:  The study images were of technically adequate quality.    Indications: Hypotension    Conclusions:    Findings  Left Ventricle:   Normal left ventricular size. Mildly depressed left ventricular systolic function. The left ventricular ejection fraction by visual assessment is estimated to be 45-50%. There is  global hypokinesis of the left ventricle. Left ventricular diastolic parameters are normal.  Right Ventricle:   Normal right ventricular size. Normal right ventricular systolic function. Right ventricular systolic pressure is normal.  Left Atrium:   The left atrium is normal in size.  Right Atrium:   The right atrium is of normal size.  Mitral Valve:   The mitral valve is normal. No evidence of mitral stenosis. There is trace to mild mitral regurgitation.  Tricuspid Valve:   The tricuspid valve is normal. There is trace to mild tricuspid regurgitation. There is no evidence of tricuspid stenosis.  Aortic Valve:   The aortic valve is normal. No Aortic valve stenosis. There is no evidence of aortic regurgitation.  Pulmonic Valve:   The pulmonic valve is not well visualized. No evidence of pulmonic regurgitation. There is no evidence of pulmonic valve stenosis.  Atrial Septum:   The  interatrial septum is normal in appearance.  IVC/Hepatic Veins:   The inferior vena cava was not visualized.  Aorta:   The aortic root is of normal size. The ascending aorta is normal in size.  Pericardium/Pleural space:   No significant pericardial effusion demonstrated.    Electronically signed by: MD Casimer Bilis on 02/10/2023 02:33 PM       LEFT HEART CATH  01/31/23  CORONARY ANATOMY  1.  Right coronary:  The right coronary artery is a small caliber vessel giving rise to PDA and posterolateral branches.  The right coronary artery, PDA, and posterolateral       branch have 20-30% diffuse disease.  2.  Left main coronary:  The left main bifurcates into the LAD and circumflex systems.  The left main has 0-5% diffuse disease.  3.  Left anterior descending coronary:  The LAD is a moderate size vessel system.  It has tandem mid-LAD in-stent restenosis.  Remainder of the vessel has 10-20% diffuse       disease.  4.  Circumflex coronary:  The circumflex gives  rise to 3 OMs.  Circumflex proper, OM-1, OM-2, and OM-3 have 10-20% diffuse disease.     CONCLUSION  1.  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  2.  Mild circumflex and right coronary artery disease.  3.  Possible mild aortic stenosis on pullback gradient.  4.  Tobacco addiction/abuse, which was discussed at length the need to stop.       STRESS TEST/MYOCARDIAL PERFUSION IMAGING  01/31/23 abnormal with moderate area of ischemia in the septal segment of medium size.  There is moderate area of ischemia in the apical segment of very small size.  There is moderate area of ischemia in the inferobasal segment of very small size.  There is evidence of small infarct noted in the septal, anteroseptal segment.  There is evidence of very small infarct noted in the apical, inferoseptal segment.  EF 68%.           XR AP MOBILE CHEST  Result Date: 02/11/2023  Impression 1. Multiple findings as described above. Radiologist location ID: [id number]             Assessment/Plan:  Active Hospital Problems    Diagnosis    Primary Problem: Chest pain    Prediabetes    S/P CABG x 1    Essential hypertension    Hyperlipidemia     Patient s/p CABG with postop AFib.  Not currently on anticoagulation due to hemoptysis.  She remains in sinus rhythm on telemetry.  Pulmonology following.  Recommend continued ambulation/IS as tolerated.    I have seen, examined and discussed patients history and current issues.  I agree with the charted information and examination by Jori Moll APRN-BC.  I have performed my own physical examination which is as follows:   AAO x 3  Heart RRR   Lungs clear  Abd N x 4, negative masses, nontender  Ext no edema  Femoral arteries are +1 bilateral and equal   No gross focal neurovascular deficits noted  DP/PT and  radials +1 bilaterally equal  More than 50% of the billable services performed this visit were performed by me.  This includes the time spent reviewing chart, my nurse practitioners note and briefing, independent review of patients available labs and imaging, time spent in face to face encounter with patient and discussing test results.       Jori Moll, APRN,NP-C

## 2023-02-12 NOTE — Progress Notes (Signed)
[facility]  Progress Note    [name]  Date of service: 02/12/2023   Date of Admission:  01/28/2023    Chief Complaint: Shortness of breath    Subjective:   [name] is a 67 year old female who initially presented to the hospital on 04/20 secondary to headache with associated epigastric pain and hypertension.  Did have episode of chest pain that resolved spontaneously but this headache has been ongoing since.  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  We are consulted postoperatively for critical care management.  [name] still drowsy postprocedure but has been extubated.  Per report is a current everyday smoker.  Unable to obtain full HPI/ROS.     4/30:  Awake and alert.  On 2 L of oxygen.  Has been out of bed most of the morning.  Had several episodes of nausea and vomiting overnight for which she received IV fluid.  Nausea a little better this a.m. but still not much appetite.  Was able to eat a few crackers.  She denies shortness of breath or fever.  She does have postoperative surgical discomfort that has been well managed with analgesia.     5/1:  Up in chair currently on 2 L nasal cannula satting well with minimal shortness of breath.  Still having some issues with deep breath however getting chest tubes and wires out today.     5/2:  Up in chair currently on 2 L nasal cannula.  Shortness of breath improving.  Does not have home O2.  Chest tubes and wires have been removed, still with JP drain in place.  Progressing well.     5/3:  Awake and alert.  On 2 L of oxygen.  Denies current shortness of breath, chest pain, fever, nausea vomiting.  Had an episode of shortness of breath, chest tightness,  and then coughed up bloody material during the night.  Nursing reports the development of rapid AFib for which she was placed on amiodarone drip.  She was currently in sinus rhythm.    5/4:  [name] sitting up in bedside chair.  She is currently on room air.  She reports that she has been having some epistasis but no hemoptysis.  She continues to use her IS.  Breathing feels better today.  She was given a 1 time dose of Lasix yesterday.    5/5:  [name] sitting up in bedside chair.  She is currently on 1.5 L nasal cannula.  She reports that she was had no further episodes of hemoptysis or epistasis.  Reports that she was likely being discharged home tomorrow.    Vital Signs:  Temp  Avg: 37.1 C (98.8 F)  Min: 36.3 C (97.4 F)  Max: 37.6 C (99.6 F)    Pulse  Avg: 76.7  Min: 69  Max: 85 BP  Min: 107/66  Max: 127/77   Resp  Avg: 17.3  Min: 13  Max: 19 SpO2  Avg: 93.6 %  Min: 90 %  Max: 97 %          Medications:   acetaminophen (TYLENOL) tablet, 650 mg, Oral, Q4H PRN  amiodarone (CORDARONE) tablet, 400 mg, Oral, 2x/day-Food  anastrozole (ARIMIDEX) tablet, 1 mg, Oral, Daily  ascorbic acid (VITAMIN C) tablet, 500 mg, Oral, Daily  aspirin chewable tablet 81 mg, 81 mg, Oral, Daily  atorvastatin (LIPITOR) tablet, 40 mg, Oral, QPM  bisacodyl (DULCOLAX) rectal suppository, 10 mg, Rectal, Daily PRN  cholecalciferol (VITAMIN D3) 1000 unit (25 mcg) tablet, 1,000 Units, Oral, Daily  clonazePAM (klonoPIN) tablet, 0.5 mg, Oral, Q12H PRN  clopidogrel (PLAVIX) 75 mg tablet, 75 mg, Oral, Daily  Correction/SSIP insulin lispro (HumaLOG) 100 units/mL injection, 0-18 Units, Subcutaneous, 4x/day AC  dextrose (GLUTOSE) 40% oral gel, 15 g, Oral, Q15 Min PRN  dextrose 50% (0.5 g/mL) injection - syringe, 12.5 g, Intravenous, Q15 Min PRN  ferrous sulfate 325 mg (elemental IRON 65 mg) enteric coated tablet, 325 mg, Oral, Daily before Breakfast  fluticasone (FLONASE) 50 mcg per spray nasal spray, 1 Spray, Each Nostril, Once  PRN  glucagon (GLUCAGEN) injection 1 mg, 1 mg, IntraMUSCULAR, Once PRN  guaiFENesin (MUCINEX) extended release tablet - for cough (expectorant), 600 mg, Oral, 2x/day  HYDROmorphone (DILAUDID) 0.5 mg/0.5 mL injection, 0.2 mg, Intravenous, Q4H PRN  ipratropium (ATROVENT) 0.02% nebulizer solution, 0.5 mg, Nebulization, Q4H PRN  levalbuterol (XOPENEX) 1.25 mg/ 3 mL nebulizer solution, 1.25 mg, Nebulization, Q4H PRN  magnesium hydroxide (MILK OF MAGNESIA) 400mg  per 5mL oral liquid, 30 mL, Oral, Daily PRN  metoprolol tartrate (LOPRESSOR) tablet, 25 mg, Oral, 2x/day  multivitamin (THERA) tablet, 1 Tablet, Oral, Daily  neomycin-bacitracin-polymyxin (NEOSPORIN) topical ointment, , Apply Topically, 2x/day PRN  nitroGLYCERIN (NITROSTAT) sublingual tablet, 0.4 mg, Sublingual, Q5 Min PRN  omega-3 fatty acids (LOVAZA) capsule, 1 g, Oral, Daily  ondansetron (ZOFRAN) 2 mg/mL injection, 4 mg, Intravenous, Q4H PRN  oxyCODONE (ROXICODONE) immediate release tablet, 5 mg, Oral, Q4H PRN  oxyCODONE (ROXICODONE) immediate release tablet, 10 mg, Oral, Q4H PRN  oxyCODONE-acetaminophen (PERCOCET) 5-325mg  per tablet, 1 Tablet, Oral, Q6H PRN  pantoprazole (PROTONIX) delayed release tablet, 40 mg, Oral, Daily  sennosides-docusate sodium (SENOKOT-S) 8.6-50mg  per tablet, 1 Tablet, Oral, 2x/day  sertraline (ZOLOFT) tablet, 100 mg, Oral, Daily  traZODone (DESYREL) tablet, 150 mg, Oral, NIGHTLY        Physical Exam:    GENERAL: Awake and alert. No acute distress noted.   SKIN: Warm and dry.  HEAD: Atraumatic, normocephalic.  EYES: Pupils equal and reactive. EOMI.  EARS AND NOSE: Normal to examination.  NECK: Supple, Trachea midline.  CARDIAC: Regular rate and rhythm.  JP drain with bloody drainage to chest.  LUNGS: Clear with very faint wheezes.  GI: Abdomen soft, nontender. Good bowel sounds x 4.   MUSCULOSKELETAL: Good muscle strength throughout.  EXTREMITIES: No pedal edema noted. Distal pulses equal bilaterally.  NEUROLOGICAL: Cranial nerves  grossly intact. No gross focal deficit.  PSYCHIATRIC: Appropriate mood and affect.     RESPIRATORY REQUIREMENTS:  1.5 L nasal cannula    Labs:     Results for orders placed or performed during the hospital encounter of 01/28/23 (from the past 24 hour(s))   POC BLOOD GLUCOSE (RESULTS)   Result Value Ref Range    GLUCOSE, POC 141 (H) 70 - 100 mg/dl   POC BLOOD GLUCOSE (RESULTS)   Result Value Ref Range    GLUCOSE, POC 129 (H) 70 - 100 mg/dl   POC BLOOD GLUCOSE (RESULTS)   Result Value Ref Range    GLUCOSE, POC 156 (H) 70 - 100 mg/dl   POC BLOOD GLUCOSE (RESULTS)   Result Value Ref Range    GLUCOSE, POC 130 (H) 70 - 100 mg/dl  POC BLOOD GLUCOSE (RESULTS)   Result Value Ref Range    GLUCOSE, POC 128 (H) 70 - 100 mg/dl   POC BLOOD GLUCOSE (RESULTS)   Result Value Ref Range    GLUCOSE, POC 162 (H) 70 - 100 mg/dl        Radiology:   Results for orders placed or performed during the hospital encounter of 01/28/23 (from the past 72 hour(s))   XR AP MOBILE CHEST     Status: None    Narrative    Elvia JEAN Marohl    XR AP MOBILE CHEST performed on 02/10/2023 10:33 AM.    INDICATION:  abnormal lung sounds   Additional History:  difficulty breathing, post op    TECHNIQUE:  Single portable frontal view of the chest.  1 views/1 images submitted for interpretation.    COMPARISON:  Feb 09, 2023  ___________________________________  FINDINGS:    Small left pleural effusion as before. Left basilar atelectasis and/or infiltrate increasingly obscures the left heart border suggesting some progression in the lingula.      Impression    Increasing infiltrate and/or atelectasis in the lingula obscuring the left heart border. Left pleural effusion as before.        Radiologist location ID: [id number]     XR AP MOBILE CHEST     Status: None    Narrative    Ritisha JEAN Epple    XR AP MOBILE CHEST performed on 02/11/2023 8:50 AM.    INDICATION:  left effusion   Additional History:  left effusion, recent CABG, current smoker, hx of cardiac stent,  bilateral mastectomy, chronic lung disease, CAD, htn and chronic bronchitis    TECHNIQUE:  Single portable frontal view of the chest.  1 views/1 images submitted for interpretation.    COMPARISON:  Prior days exam  ___________________________________  FINDINGS:    Left lower lobe retrocardiac atelectasis or infiltrate slightly increased. Estimated small right and trace left pleural effusion. Right appears increased. Left appears improved. There is minimal right basilar probable atelectasis increased. No pneumothorax on either side. Heart size is within normal limits. Vascularity appears mildly to moderately congested slightly increased.    There appears to been a nasogastric tube present previously not evident on current film. There are postop changes of sternum and cervical spine. Surgical clips overlie the breasts and/or axillary regions.  ___________________________________    Impression    1. Multiple findings as described above.        Radiologist location ID: [id number]         Assessment:  Active Hospital Problems    Diagnosis    Primary Problem: Chest pain    Prediabetes    S/P CABG x 1    Essential hypertension    Hyperlipidemia        Plan:   Patient denies any further hemoptysis.  Remains on Plavix.  Currently oxygenating well 1.5 L.  Wean O2 as tolerated.  Chest x-ray yesterday was independently reviewed and showed effusions bilaterally with moderate vascular congestion.  She was on room air yesterday.  We will give a 1 time dose of IV Lasix.  Possibly home tomorrow per patient report.  Continue bronchodilators and ICS.  She has a history of chronic obstructive pulmonary disease with an FEV1 of 57%.  Continue incentive spirometer hourly.      Evalyn Casco, APRN-FNP-BC    This note may have been partially generated using MModal Fluency Direct system, and there may be some incorrect words, spellings,  and punctuation that were not noted in checking the note before saving.      (

## 2023-02-12 NOTE — Consults (Signed)
ENDOCRINOLOGY PROGRESS NOTE      Name: [name]  Age: 67 y.o.  MRN: [id number]    Reason for Consult:  Type 2 diabetes    Subjective:   [name] in bed and she is without complaints.  Glucoses today are [id number].  She is received instruction on diabetic diet but I see a regular Coke and recessed peanut butter cups in her room.  She is status post coronary artery bypass graft x1 by Dr. Osvaldo Angst.  She denies any nausea and vomiting no constipation or diarrhea.  No shortness of breath and no chest pain.        Review of Systems:   Negative except for what has been mentioned in subjective.    Objective :  Patient Vitals for the past 24 hrs:   BP Temp Pulse Resp SpO2 Weight   02/12/23 1500 113/67 36.9 C (98.4 F) 79 14 93 % --   02/12/23 1145 -- -- -- -- 94 % --   02/12/23 1100 109/75 36.4 C (97.6 F) 73 17 96 % --   02/12/23 0700 107/66 36.3 C (97.4 F) 75 18 97 % --   02/12/23 0600 -- -- -- -- -- 67.7 kg (149 lb 4 oz)   02/12/23 0300 127/77 37.6 C (99.6 F) 81 19 92 % --   02/11/23 2300 118/70 37.6 C (99.6 F) 69 13 90 % --   02/11/23 2216 -- -- -- -- 94 % --   02/11/23 1900 114/67 37.3 C (99.1 F) 85 19 95 % --          Physical Exam  Constitutional:       Appearance: Normal appearance.   HENT:      Head: Normocephalic and atraumatic.   Eyes:      Extraocular Movements: Extraocular movements intact.   Cardiovascular:      Rate and Rhythm: Normal rate and regular rhythm.   Pulmonary:      Effort: Pulmonary effort is normal.      Breath sounds: Normal breath sounds.   Abdominal:      General: Bowel sounds are normal. There is no distension.      Palpations: Abdomen is soft.      Tenderness: There is no abdominal tenderness.   Musculoskeletal:      Right lower leg: No swelling.      Left lower leg: No swelling.   Neurological:      Mental Status: She is alert.          Labs and Imaging:  Results for orders placed or performed during the hospital encounter of 01/28/23 (from the past 24 hour(s))   POC BLOOD  GLUCOSE (RESULTS)   Result Value Ref Range    GLUCOSE, POC 156 (H) 70 - 100 mg/dl   POC BLOOD GLUCOSE (RESULTS)   Result Value Ref Range    GLUCOSE, POC 130 (H) 70 - 100 mg/dl   POC BLOOD GLUCOSE (RESULTS)   Result Value Ref Range    GLUCOSE, POC 128 (H) 70 - 100 mg/dl   POC BLOOD GLUCOSE (RESULTS)   Result Value Ref Range    GLUCOSE, POC 162 (H) 70 - 100 mg/dl              Assessment/Plan:  Active Hospital Problems    Diagnosis    Primary Problem: Chest pain    PAF (paroxysmal atrial fibrillation) (CMS HCC)    Prediabetes    S/P CABG x 1  Essential hypertension    Hyperlipidemia        Continue 1800 calorie ADA diet  I stressed to her the importance of being adherent to the diabetic diet  At discharge we could consider starting her on Jardiance      Buelah Rennie V, MD

## 2023-02-12 NOTE — Progress Notes (Signed)
Coronary artery bypass graft x1  [facility]  Cardiac Surgery   Floor/Stepdown Progress Note      [name], [name]  Date of Admission:  01/28/2023  Date of Birth:  April 23, 1956    Hospital Day:  LOS: 15 days   Post-op Day:   , S/P  coronary artery bypass graft x1  Date of Service:  02/12/2023    SUBJECTIVE:  She reports no complaints this morning.  She had no further episodes of epistaxis.  She has remained in sinus rhythm overnight on oral amiodarone.    OBJECTIVE:    Vital Signs:  Temp (24hrs) Max:37.6 C (99.6 F)      Temperature: 36.4 C (97.6 F) (02/12/23 1100)  BP (Non-Invasive): 109/75 (02/12/23 1100)  MAP (Non-Invasive): 91 mmHG (02/12/23 1100)  Heart Rate: 73 (02/12/23 1100)  Respiratory Rate: 17 (02/12/23 1100)  SpO2: 94 % (02/12/23 1145)    Base (Admission) Weight:  Weight: 71.2 kg (157 lb)  Weight:  Weight: 67.7 kg (149 lb 4 oz)    Current Inpatient Medications:  acetaminophen (TYLENOL) tablet, 650 mg, Oral, Q4H PRN  amiodarone (CORDARONE) tablet, 400 mg, Oral, 2x/day-Food  anastrozole (ARIMIDEX) tablet, 1 mg, Oral, Daily  ascorbic acid (VITAMIN C) tablet, 500 mg, Oral, Daily  aspirin chewable tablet 81 mg, 81 mg, Oral, Daily  atorvastatin (LIPITOR) tablet, 40 mg, Oral, QPM  bisacodyl (DULCOLAX) rectal suppository, 10 mg, Rectal, Daily PRN  cholecalciferol (VITAMIN D3) 1000 unit (25 mcg) tablet, 1,000 Units, Oral, Daily  clonazePAM (klonoPIN) tablet, 0.5 mg, Oral, Q12H PRN  clopidogrel (PLAVIX) 75 mg tablet, 75 mg, Oral, Daily  Correction/SSIP insulin lispro (HumaLOG) 100 units/mL injection, 0-18 Units, Subcutaneous, 4x/day AC  dextrose (GLUTOSE) 40% oral gel, 15 g, Oral, Q15 Min PRN  dextrose 50% (0.5 g/mL) injection - syringe, 12.5 g, Intravenous, Q15 Min PRN  ferrous sulfate 325 mg (elemental IRON 65 mg) enteric coated tablet, 325 mg, Oral, Daily before Breakfast  fluticasone (FLONASE) 50 mcg per spray nasal spray, 1 Spray, Each Nostril, Once PRN  furosemide (LASIX) 10 mg/mL injection,  40 mg, Intravenous, Once  glucagon (GLUCAGEN) injection 1 mg, 1 mg, IntraMUSCULAR, Once PRN  guaiFENesin (MUCINEX) extended release tablet - for cough (expectorant), 600 mg, Oral, 2x/day  HYDROmorphone (DILAUDID) 0.5 mg/0.5 mL injection, 0.2 mg, Intravenous, Q4H PRN  ipratropium (ATROVENT) 0.02% nebulizer solution, 0.5 mg, Nebulization, Q4H PRN  levalbuterol (XOPENEX) 1.25 mg/ 3 mL nebulizer solution, 1.25 mg, Nebulization, Q4H PRN  magnesium hydroxide (MILK OF MAGNESIA) 400mg  per 5mL oral liquid, 30 mL, Oral, Daily PRN  metoprolol tartrate (LOPRESSOR) tablet, 25 mg, Oral, 2x/day  multivitamin (THERA) tablet, 1 Tablet, Oral, Daily  neomycin-bacitracin-polymyxin (NEOSPORIN) topical ointment, , Apply Topically, 2x/day PRN  nitroGLYCERIN (NITROSTAT) sublingual tablet, 0.4 mg, Sublingual, Q5 Min PRN  omega-3 fatty acids (LOVAZA) capsule, 1 g, Oral, Daily  ondansetron (ZOFRAN) 2 mg/mL injection, 4 mg, Intravenous, Q4H PRN  oxyCODONE (ROXICODONE) immediate release tablet, 5 mg, Oral, Q4H PRN  oxyCODONE (ROXICODONE) immediate release tablet, 10 mg, Oral, Q4H PRN  oxyCODONE-acetaminophen (PERCOCET) 5-325mg  per tablet, 1 Tablet, Oral, Q6H PRN  pantoprazole (PROTONIX) delayed release tablet, 40 mg, Oral, Daily  sennosides-docusate sodium (SENOKOT-S) 8.6-50mg  per tablet, 1 Tablet, Oral, 2x/day  sertraline (ZOLOFT) tablet, 100 mg, Oral, Daily  traZODone (DESYREL) tablet, 150 mg, Oral, NIGHTLY        Appropriate Home Meds restarted:  Yes    I/O:  I/O last 24 hours to current time:    Intake/Output Summary (Last  24 hours) at 02/12/2023 1241  Last data filed at 02/12/2023 0900  Gross per 24 hour   Intake --   Output 55 ml   Net -55 ml     I/O last 3 completed shifts:  In: 240 [P.O.:240]  Out: 30 [Drains:30]    Nutrition/Diet:  DIET DIABETIC CARDIAC Carb Amount: CCD4 = 60 Grams  DIETARY SUPPLEMENTS Glucerna Shake - Chocolate; BREAKFAST; 1 Each    Hardware (foley):   Date Placed Necessity Reviewed  Date Discontinued    Foley (out day  number two)                      Labs:  Reviewed:  Lab Results Today:    Results for orders placed or performed during the hospital encounter of 01/28/23 (from the past 24 hour(s))   POC BLOOD GLUCOSE (RESULTS)   Result Value Ref Range    GLUCOSE, POC 129 (H) 70 - 100 mg/dl   POC BLOOD GLUCOSE (RESULTS)   Result Value Ref Range    GLUCOSE, POC 156 (H) 70 - 100 mg/dl   POC BLOOD GLUCOSE (RESULTS)   Result Value Ref Range    GLUCOSE, POC 130 (H) 70 - 100 mg/dl   POC BLOOD GLUCOSE (RESULTS)   Result Value Ref Range    GLUCOSE, POC 128 (H) 70 - 100 mg/dl   POC BLOOD GLUCOSE (RESULTS)   Result Value Ref Range    GLUCOSE, POC 162 (H) 70 - 100 mg/dl         Radiology:    Reviewed:  XR AP MOBILE CHEST    Result Date: 02/11/2023  Impression 1. Multiple findings as described above. Radiologist location ID: [id number]            Physical Exam:    Lungs:  Clear to auscultation bilaterally.  Heart:  Regular rate and rhythm without murmur or gallop.  Abdomen:  Soft nontender with normoactive bowel sounds.  Extremities:  No peripheral edema.  Incisions:  Clean dry and intact.  Appear to be healing well.  ASSESSMENT/PLAN:  1. She remains in sinus rhythm on oral amiodarone.  Continue amiodarone taper.  I do not think she will need anticoagulation.  Discharge home likely tomorrow.  2. Epistaxis appears to have resolved.  Continue to monitor  3. Wean oxygen off.  4. Likely home tomorrow.    Problem List:  Active Hospital Problems    Diagnosis    Primary Problem: Chest pain    PAF (paroxysmal atrial fibrillation) (CMS HCC)    Prediabetes    S/P CABG x 1    Essential hypertension    Hyperlipidemia       PT/OT: Yes    DVT RISK FACTORS HAVE BEN ASSESSED AND PROPHYLAXIS ORDERED (SEE RUBYONLINE - REFERENCE TOOLS - MD, DVT PROPHY OR POCKET CARD):  YES    Disposition Planning: Home discharge      [facility], MD 02/12/2023 12:41

## 2023-02-12 NOTE — Care Plan (Signed)
Problem: Adult Inpatient Plan of Care  Goal: Patient-Specific Goal (Individualized)  Outcome: Ongoing (see interventions/notes)  Flowsheets (Taken 02/12/2023 0700)  Patient would like to participate in bedside shift report: Yes  Anxieties, Fears or Concerns: JP drainage  Patient-Specific Goals (Include Timeframe): Ambulate 3x day  Plan of Care Reviewed With: patient   [facility] Plan Note    Situation: Patient complains of pain related to CABG incision    Intervention: Incision dressing changed using Medipore tape and drain gauze    Response: Irritation at incision site improved      Thomes Dinning, RN

## 2023-02-12 NOTE — Respiratory Therapy (Signed)
BiPAP machine not in room due to patient not wearing for several days. No distress noted at this time. Will continue to monitor.

## 2023-02-13 DIAGNOSIS — F17200 Nicotine dependence, unspecified, uncomplicated: Principal | ICD-10-CM | POA: Diagnosis present

## 2023-02-13 LAB — TYPE AND SCREEN
ABO/RH(D): O POS
ANTIBODY SCREEN: NEGATIVE

## 2023-02-13 LAB — BASIC METABOLIC PANEL
BUN/CREA RATIO: 24
BUN: 16 mg/dL (ref 8–23)
CALCIUM: 9.3 mg/dL (ref 8.3–10.7)
CHLORIDE: 104 mmol/L (ref 96–106)
CO2 TOTAL: 25 mmol/L (ref 22–30)
CREATININE: 0.66 mg/dL (ref 0.50–0.90)
ESTIMATED GFR: >90 mL/min/{1.73_m2} (ref >90)
GLUCOSE: 116 mg/dL — ABNORMAL HIGH (ref 74–109)
POTASSIUM: 3.8 mmol/L (ref 3.2–5.0)
SODIUM: 145 mmol/L — ABNORMAL HIGH (ref 133–144)

## 2023-02-13 LAB — CROSSMATCH RED CELLS - UNITS: UNIT DIVISION: 0

## 2023-02-13 LAB — POC BLOOD GLUCOSE (RESULTS)
GLUCOSE, POC: 143 mg/dl — ABNORMAL HIGH (ref 70–100)
GLUCOSE, POC: 125 mg/dl — ABNORMAL HIGH (ref 70–100)

## 2023-02-13 LAB — SURGICAL PATHOLOGY

## 2023-02-13 MED ORDER — HYDROCODONE 5 MG-ACETAMINOPHEN 325 MG TABLET
1.0000 | ORAL_TABLET | Freq: Four times a day (QID) | ORAL | 0 refills | Status: DC | PRN
Start: 2023-02-13 — End: 2023-02-24

## 2023-02-13 MED ORDER — AMIODARONE 400 MG TABLET
ORAL_TABLET | ORAL | 0 refills | Status: DC
Start: 2023-02-13 — End: 2023-02-14

## 2023-02-13 MED ORDER — ASPIRIN 81 MG CHEWABLE TABLET
81.0000 mg | CHEWABLE_TABLET | Freq: Every day | ORAL | 2 refills | Status: AC
Start: 2023-02-14 — End: ?

## 2023-02-13 MED ORDER — HYDROCODONE 5 MG-ACETAMINOPHEN 325 MG TABLET
1.0000 | ORAL_TABLET | Freq: Four times a day (QID) | ORAL | Status: DC | PRN
Start: 2023-02-13 — End: 2023-02-13
  Administered 2023-02-13: 1 via ORAL
  Filled 2023-02-13: qty 1

## 2023-02-13 NOTE — Care Management Notes (Signed)
Referral Information  ++++++ Placed Provider #1 ++++++  Case Manager: Wyn Forster  Provider Type: Home Health  Provider Name: [facility]  Address:  [address]  [address]  [place], [place] [address]  Contact: [facility]    Phone: [phone] x  Fax:   Fax: [phone]

## 2023-02-13 NOTE — Care Plan (Signed)
Problem: Adult Inpatient Plan of Care  Goal: Plan of Care Review  Outcome: Adequate for Discharge  Goal: Patient-Specific Goal (Individualized)  Outcome: Adequate for Discharge  Goal: Absence of Hospital-Acquired Illness or Injury  Outcome: Adequate for Discharge  Intervention: Identify and Manage Fall Risk  Recent Flowsheet Documentation  Taken 02/13/2023 1105 by Titus Dubin, RN  Safety Promotion/Fall Prevention: fall prevention program maintained  Taken 02/13/2023 0900 by Titus Dubin, RN  Safety Promotion/Fall Prevention: fall prevention program maintained  Taken 02/13/2023 0700 by Titus Dubin, RN  Safety Promotion/Fall Prevention: fall prevention program maintained  Intervention: Prevent and Manage VTE (Venous Thromboembolism) Risk  Recent Flowsheet Documentation  Taken 02/13/2023 1105 by Titus Dubin, RN  VTE Prevention/Management: compression stockings on  Taken 02/13/2023 0700 by Titus Dubin, RN  VTE Prevention/Management: compression stockings on  Intervention: Prevent Infection  Recent Flowsheet Documentation  Taken 02/13/2023 0900 by Titus Dubin, RN  Infection Prevention: rest/sleep promoted  Taken 02/13/2023 0700 by Titus Dubin, RN  Infection Prevention: rest/sleep promoted  Goal: Optimal Comfort and Wellbeing  Outcome: Adequate for Discharge  Goal: Rounds/Family Conference  Outcome: Adequate for Discharge     Problem: Fall Injury Risk  Goal: Absence of Fall and Fall-Related Injury  Outcome: Adequate for Discharge  Intervention: Promote Injury-Free Environment  Recent Flowsheet Documentation  Taken 02/13/2023 1105 by Titus Dubin, RN  Safety Promotion/Fall Prevention: fall prevention program maintained  Taken 02/13/2023 0900 by Titus Dubin, RN  Safety Promotion/Fall Prevention: fall prevention program maintained  Taken 02/13/2023 0700 by Titus Dubin, RN  Safety Promotion/Fall Prevention: fall prevention program maintained     Problem: Pain Acute  Goal: Optimal Pain Control and Function  Outcome:  Adequate for Discharge  Intervention: Prevent or Manage Pain  Recent Flowsheet Documentation  Taken 02/13/2023 1105 by Titus Dubin, RN  Bowel Elimination Promotion: ambulation promoted  Taken 02/13/2023 0700 by Titus Dubin, RN  Sensory Stimulation Regulation: quiet environment promoted  Bowel Elimination Promotion: ambulation promoted     Problem: Fall Injury Risk  Goal: Absence of Fall and Fall-Related Injury  Outcome: Adequate for Discharge  Intervention: Promote Injury-Free Environment  Recent Flowsheet Documentation  Taken 02/13/2023 1105 by Titus Dubin, RN  Safety Promotion/Fall Prevention: fall prevention program maintained  Taken 02/13/2023 0900 by Titus Dubin, RN  Safety Promotion/Fall Prevention: fall prevention program maintained  Taken 02/13/2023 0700 by Titus Dubin, RN  Safety Promotion/Fall Prevention: fall prevention program maintained     Problem: Oral Intake Inadequate  Goal: Improved Oral Intake  Outcome: Adequate for Discharge

## 2023-02-13 NOTE — Nurses Notes (Signed)
JP DRAIN WAS REMOVED ALREADY. I REMOVED IV.

## 2023-02-13 NOTE — Care Plan (Signed)
Problem: Adult Inpatient Plan of Care  Goal: Plan of Care Review  Outcome: Ongoing (see interventions/notes)  Goal: Patient-Specific Goal (Individualized)  Outcome: Ongoing (see interventions/notes)  Goal: Absence of Hospital-Acquired Illness or Injury  Outcome: Ongoing (see interventions/notes)  Intervention: Identify and Manage Fall Risk  Recent Flowsheet Documentation  Taken 02/12/2023 1915 by Yvetta Coder, RN  Safety Promotion/Fall Prevention: fall prevention program maintained  Intervention: Prevent Skin Injury  Recent Flowsheet Documentation  Taken 02/12/2023 1915 by Yvetta Coder, RN  Body Position: supine  Intervention: Prevent and Manage VTE (Venous Thromboembolism) Risk  Recent Flowsheet Documentation  Taken 02/12/2023 1915 by Yvetta Coder, RN  VTE Prevention/Management: compression stockings on  Goal: Optimal Comfort and Wellbeing  Outcome: Ongoing (see interventions/notes)  Goal: Rounds/Family Conference  Outcome: Ongoing (see interventions/notes)

## 2023-02-13 NOTE — Discharge Summary (Signed)
[facility]  DISCHARGE SUMMARY    PATIENT NAME:  [name], [name]  MRN:  [id number]  DOB:  May 01, 1956    ENCOUNTER DATE:  01/28/2023  INPATIENT ADMISSION DATE: 01/28/2023  DISCHARGE DATE:  02/13/2023    ATTENDING PHYSICIAN: Tamala Fothergill, MD  SERVICE: [facility] CARDIAC SURGERY  PRIMARY CARE PHYSICIAN: Lillia Mountain, PA-C       No lay caregiver identified.    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        Details   amiodarone 400 mg Tablet  Commonly known as: PACERONE   400mg  po bid x 7 days, then 400mg  po daily x 7 days,  then 200 mg po daily x 7 days, Then discontinue  Qty: 25 Tablet  Refills: 0     aspirin 81 mg Tablet, Chewable  Start taking on: Feb 14, 2023   81 mg, Oral, DAILY  Qty: 30 Tablet  Refills: 2     HYDROcodone-acetaminophen 5-325 mg Tablet  Commonly known as: NORCO   1 Tablet, Oral, EVERY 6 HOURS PRN  Qty: 8 Tablet  Refills: 0            CONTINUE these medications - NO CHANGES were made during your visit.        Details   anastrozole 1 mg Tablet  Commonly known as: ARIMIDEX   1 mg, Oral, DAILY  Refills: 0     atorvastatin 40 mg Tablet  Commonly  known as: LIPITOR   40 mg, Oral, DAILY  Refills: 0     cholecalciferol (vitamin D3) 25 mcg (1,000 unit) Tablet   1,000 Units, Oral, DAILY  Refills: 0     clonazePAM 0.5 mg Tablet  Commonly known as: klonoPIN   0.5 mg, Oral, 2 TIMES DAILY PRN  Refills: 0     fluticasone propionate 50 mcg/actuation Spray, Suspension  Commonly known as: FLONASE   1 Spray, DAILY PRN  Refills: 0     metoprolol tartrate 25 mg Tablet  Commonly known as: LOPRESSOR   25 mg, Oral, 2 TIMES DAILY  Refills: 0     MV,Ca,Ir,Min-FA-Lut-Lyco-HC153 3-133-33 mg-mcg-mcg Capsule   1 Capsule, Oral, DAILY  Refills: 0     omega-3 fatty acid 1 gram Capsule  Commonly known as: LOVAZA   1 Capsule, Oral, DAILY  Refills: 0     pantoprazole 40 mg Tablet, Delayed Release (E.C.)  Commonly known as: PROTONIX   40 mg, Oral, DAILY  Refills: 0  sertraline 100 mg Tablet  Commonly known as: ZOLOFT   100 mg, Oral, DAILY  Refills: 0     traZODone 150 mg Tablet  Commonly known as: DESYREL   150 mg, Oral, NIGHTLY  Refills: 0            STOP taking these medications.      albuterol sulfate 2.5 mg /3 mL (0.083 %) nebulizer solution  Commonly known as: PROVENTIL     lisinopriL 5 mg Tablet  Commonly known as: PRINIVIL            Discharge med list refreshed?  YES     No Known Allergies  HOSPITAL PROCEDURE(S):   CABG X 1    1.  Proximal: Left internal thoracic artery    Distal: Left anterior descending coronary artery    REASON FOR HOSPITALIZATION AND HOSPITAL COURSE   BRIEF HPI:  This is a 67 y.o., female admitted for elective cath. Ms. Hakola is a 67 year old female who presented to the emergency room at [facility] with complaints of chest pain.  She has a known history of coronary artery disease and underwent PCI to the LAD in 2019 by Dr. Peri Jefferson.  She has had routine follow-up and was last seen approximately 1 year ago and was doing well.  She reports having episodes of chest pain with radiation down the left arm while helping her granddaughter perform  housework.  She smokes at least 1 pack of cigarettes daily and has been smoking for approximately 40 years.  Since admission her chest pain has resolved.  She under work underwent workup including a stress test which was abnormal.  She had left heart catheterization this morning that showed in stent restenosis within the LAD stent.  Left ventricular function is well preserved.  She had no other significant coronary artery disease.  She was referred for possible coronary artery bypass grafting.  She has a history of bilateral mastectomy for right-sided early stage breast cancer.  She has not undergone chemotherapy or radiation therapy.    BRIEF HOSPITAL NARRATIVE:     Patient was admitted for elective cardiac cath. She was found to have significant LAD disease and underwent CABG X 1. She tolerated surgery well. Pacing wires and chest tubes were removed on POD # 1. She had some post op Afib and converted with AMIO bolus and GTT. She was started on oral AMIO. She remained in NSR. She developed significant nose bleeds on 3 occasions. She was transfused 1 unit. Plavix was discontinued for this reason. Home health was arranged. Post op care was discussed. BMP/CBC at 48 hours, 1 week, and 2 week post discharge. She will RTO in 1 week with CXR and EKG. JP was removed the day of discharge. AMIO taper at discharge. She will need to hold Trazodone until completion of AMIO taper. This was discussed with the patient.   TRANSITION/POST DISCHARGE CARE/PENDING TESTS/REFERRALS: none    CONDITION ON DISCHARGE:  A. Ambulation: Full ambulation  B. Self-care Ability: Complete  C. Cognitive Status Alert and Oriented x 3  D. Code status at discharge:       LINES/DRAINS/WOUNDS AT DISCHARGE:   Patient Lines/Drains/Airways Status       Active Line / Dialysis Catheter / Dialysis Graft / Drain / Airway / Wound       Name Placement date Placement time Site Days    Peripheral IV Proximal;Right Cephalic  (lateral side of arm) 02/11/23  0300  -- 2  Al Pimple Drain Lower;Medial Chest 02/07/23  1200  -- 5    Wound  Incision Medial Chest 02/06/23  1300  -- 6                    DISCHARGE DISPOSITION:  Home discharge and Home Health  DISCHARGE INSTRUCTIONS:  Post-Discharge Follow Up Appointments       Follow up with Tamala Fothergill, MD in 1 week(s)    Phone: [phone]    Where: [facility]      Friday Feb 24, 2023    [facility] Nuclear Medicine Myocardial Perfusion Injection with [facility] NM INJ 1 at  7:45 AM    [facility] Nuclear Medicine Myocardial Perfusion Scan with [facility] NM 2 at  8:30 AM    STRESS TEST with TM[facility] STRESS 1 at  9:30 AM    [facility] Nuclear Medicine Myocardial Perfusion Scan with TM[facility] NM 2 at 10:30 AM    TTE ADULT  with TM[facility] ECHO 1 at  2:00 PM      Thursday Jul 27, 2023    Return Patient Visit with Jori Moll, APRN,NP-C at 12:30 PM      Cardiology [facility], Medical Office Building [facility] Building [place], [place]  [address]  [place] [address]  [phone] Imaging Services, [facility]  [facility], Maisie Fus  [address]  [place] [address]  [phone] [facility]  [facility], Maisie Fus  [address]  [place] [address]  [phone]             XR CHEST PA AND LATERAL    Please schedule with return to [facility] clinic visit.     Ordering Provider [place], [place] [[id number]]      DISCHARGE INSTRUCTION - ACTIVITY    We suggest a cautious, yet progressive plan to safely regain your normal body and life functions. In time, you should be able to do your regular routine tasks, return to work, and take part in recreational activities. Here are a few tips:  Get up and get dressed each morning. Please do not stay in bed. Do wear comfortable clothes each day, break up long tasks into shorter parts, and space them over the day and stop your tasks before you get tired, if you do too  much, you will likely be tired the next day and need to rest, which will slow recovery, balance your activity with rest times. Your body may give you signals to rest, these include symptoms such as shortness of breath, fatigue, dizziness, and pain, rest when possible or needed. If you need to climb stairs, we suggest going slowly at first. Always remember that it takes more energy to climb a flight of steps then to walk on a level surface. If you start to become tired or have shortness of breath, stop, rest for a few minutes,   and   then continue. You should only use a railing for your balance, please do not pull yourself up the stairs as the strain can damage your incision and put excess pressure on your body.     Activity: GRADUALLY INCREASE ACTIVITY AS TOLERATED    Activity: NO LIFTING OVER 10 POUNDS FOR 2 WEEKS    Activity: NO VIGOROUS/STRENUOUS ACTIVITY      DISCHARGE INSTRUCTION - DRIVING    You will not be cleared to safely drive a car until  after your visit with your surgeon, this visit is often scheduled four weeks after your surgery. No driving while taking Narcotic (pain) medications.  If you were to be involved in a car accident, you would hurt your sternum(breast bone) or other areas that have undergone surgical procedures. When you are a passenger in a car, we suggest you ride in the back seat, if possible. If you are riding in the front seat, move as far back as possible to increase leg room, and use a pillow between your chest and the seat belt for added comfort and to avoid any irritation on your incision. We advise against taking long trips without a doctors approval, and when approved be sure to allow ample time for stops to walk and stretch your legs and arms.     DISCHARGE INSTRUCTION - INCISION/WOUND CARE    Please follow these steps to care for your incision(s): breathe in through your nose as you raise your arms during activity, breathe out through your mouth as you lower your arms. Never  hold your breath, you may raise your arms over your head to brush or shampoo your hair. Be careful when reaching. The breastbone and the muscles around it may be very sore, if this was the approach for your surgery, do not lift anything heavier than 5 pounds for 4 weeks after surgery,   (if your incision is located on either side of your chest, you may not lift anything heavier than 10 pounds for 2 weeks after your surgery),  you should not push or pull with your arms, especially when rising from a bed, assistive devices, such as canes or walkers, can be used only for balance. Do not place your full weight on any of these devices until the incision is fully healed, it is very important to keep your incisions clean and dry. Follow these guidelines: shower daily, pat dry. Do not take a tub bath for 4   weeks or until your surgeon says you may. Wash your incisions with an antimicrobial soap and water. Always use a clean cloth. Do not put any creams, lotions, or antibiotic ointments on the incisions. Keep your legs raised when sitting for more than 15 minutes, and remember to stretch. Do not wear any tight clothing that may rub against your incisions, causing irritation.     Instructions for incision/wound care: Wound Care as Instructed      DISCHARGE INSTRUCTION - IF THE FOLLOWING INFORMATION APPLIES OR IF YOU HAVE ANY QUESTIONS, PLEASE CONTACT THE SURGEON'S OFFICE  AT [phone].    Call for:  Increased warmth in the skin around an incision, redness that spreads out more than one inch from incision edges, increase of swelling, tightness, or pain around an incision, large amount of clear or pinkish drainage, sudden increase in the amount of drainage, white, yellow, or green drainage with odor coming from an incision, fever higher than 101.0 F (38.3 C), chills or temperature of 99.0 F (37.2 C) to 100.9 F (38.27 C) for more than 3 days.  Continued or severe sadness, lightheadedness/dizziness, increased shortness of  breath, burning when passing urine, severe calf pain, new severe pain in your chest, heart rate greater than 110 beats per minute or less than 50 beats per minute.     DISCHARGE INSTRUCTION - REFER TO HEART SURGERY BOOKLET    You have been provided with the comprehensive Heart Surgery: Your Heart -> Our Focus booklet.  Please continue to refer to this during your  recovery process.  It not only educates you on the discharge instructions provided to you today but also additional instructions for caring for yourself at home, heart healthy way of life, heart healthy food plan and other items to remember.     DISCHARGE INSTRUCTION - DIET -- Cardiac Diet     Diet: CARDIAC DIET      ECG 12 LEAD    S/P CARDIAC SURGERY    SCHEDULE WITH FOLLOW-UP IN CLINIC           CARDIAC REHAB PHASE II    Individualized education will be given based on the patient's diagnosis and needs.    Therapist/Nurse may place secondary order for Daily Documentation orders if needed.            Indication for Therapy S/P CABG    Method to Determine THR for Program Duration TO BE DETERMINED BY THE CARDIAC REHAB STAFF    Exercise Intensity or MET Level TO BE DETERMINED BY CARDIAC REHAB STAFF           Karma Ganja, FNP-BC    Copies sent to Care Team         Relationship Specialty Notifications Start End    Lillia Mountain, [place] PCP - General PHYSICIAN ASSISTANT  01/24/22     Phone: [phone] Fax: [phone]         [address] [place] [place] [address]            Referring providers can utilize [email or web address] to access their referred [facility] patient's information.

## 2023-02-13 NOTE — Care Plan (Signed)
[facility] Medicine [facility]   Cardiac Rehab Phase 1  [address]  [place], [place] [address]      Cardiac Rehab Phase 1 Daily Note    Azaylee Kijek   1955/12/27   Height: 157.5 cm (5' 2.01")  Weight: 67 kg (147 lb 11.3 oz)   294/A   Payor: HU[employer] MEDICARE / Plan: [employer] CHOICE PPO / Product Type: PPO /        Cardiac Rehab Orders Received and Chart Reviewed:      Cardiac Dx:  CABG      Activity Performed:  Pt is for discharge today.  Activity deferred.      Assessment:       Education Provided:  Discussed HEP and risk factors again.  Discussed tobacco cessation.    HEP given: Discussed HEP and outpatient cardiac rehab.  Pt has the direct phone number for outpatient cardiac rehab.  Discussed the outpatient program.  Pt is not sure about transportation for this.  I will follow up.      Time Spent 30    Cardiac Rehab Phase 2 Referral Location: [facility].        Vilma Prader, EP-C

## 2023-02-13 NOTE — Care Management Notes (Signed)
Social Services    Note was entered 5/3 and is not in the system.     SW followed up with the patient and she denied any needs.   Home health referral was made to [facility].

## 2023-02-14 ENCOUNTER — Other Ambulatory Visit (INDEPENDENT_AMBULATORY_CARE_PROVIDER_SITE_OTHER): Payer: Self-pay | Admitting: Family

## 2023-02-14 ENCOUNTER — Telehealth (HOSPITAL_COMMUNITY): Payer: Self-pay | Admitting: NURSE PRACTITIONER

## 2023-02-14 ENCOUNTER — Other Ambulatory Visit (HOSPITAL_COMMUNITY): Payer: Self-pay | Admitting: NURSE PRACTITIONER

## 2023-02-14 ENCOUNTER — Institutional Professional Consult (permissible substitution): Payer: Medicare HMO | Admitting: Plastic Surgery

## 2023-02-14 MED ORDER — HYDROCODONE 5 MG-ACETAMINOPHEN 325 MG TABLET
1.0000 | ORAL_TABLET | Freq: Four times a day (QID) | ORAL | 0 refills | Status: DC | PRN
Start: 2023-02-14 — End: 2023-02-24

## 2023-02-14 MED ORDER — AMIODARONE 200 MG TABLET
ORAL_TABLET | ORAL | 0 refills | Status: DC
Start: 2023-02-14 — End: 2023-02-14

## 2023-02-14 NOTE — Telephone Encounter (Signed)
Spoke with patients daughter.  Prescriptions were sent to mail in pharmacy and will take several days to weeks to receive.  She is off amiodarone and has no pain medications s/p CABG.  Will put her medications to pharmacy downstairs and give short term pain medication fill until such time as her medications can be received from her mail in pharmacy.  Her daughter works here at [employer] and prescriptions can be retrieved here today.

## 2023-02-17 ENCOUNTER — Encounter: Payer: Self-pay | Admitting: Plastic Surgery

## 2023-02-17 ENCOUNTER — Ambulatory Visit (INDEPENDENT_AMBULATORY_CARE_PROVIDER_SITE_OTHER): Payer: Medicare HMO | Admitting: Plastic Surgery

## 2023-02-17 VITALS — BP 132/82 | HR 74

## 2023-02-17 DIAGNOSIS — N62 Hypertrophy of breast: Secondary | ICD-10-CM

## 2023-02-17 DIAGNOSIS — N6489 Other specified disorders of breast: Secondary | ICD-10-CM

## 2023-02-17 NOTE — Progress Notes (Signed)
The patient is a 67 year old female here with her husband for follow-up after undergoing a left breast reduction mastopexy for symmetry.  We were able to remove 500 g from the left breast.  She has much better symmetry.  The swelling and bruising is as expected.  This will take another couple weeks to resolve.  She had some questions today about returning to normal activity.  Approximately 2 weeks for everyday activity and then another week after that for the gym for upper body exercises.  Continue with the sports bra.  Continue with massage to the lateral left breast.  Follow-up in 2 weeks.  Will get pictures at that time.

## 2023-02-20 ENCOUNTER — Encounter: Payer: Self-pay | Admitting: Plastic Surgery

## 2023-02-20 NOTE — Telephone Encounter (Signed)
Hi Joss, yes that would be great. I do not have any availability tomorrow, but it looks like Alan Ripper or Trey Paula has some availability Wednesday/Thursday. Thank you

## 2023-02-21 ENCOUNTER — Ambulatory Visit (INDEPENDENT_AMBULATORY_CARE_PROVIDER_SITE_OTHER): Payer: Medicare HMO | Admitting: Physician Assistant

## 2023-02-21 DIAGNOSIS — N6489 Other specified disorders of breast: Secondary | ICD-10-CM

## 2023-02-21 NOTE — Progress Notes (Signed)
This is a pleasant 67 year old female seen in our office for follow-up evaluation status post left breast reduction on 02/09/2023 by Dr. Ulice Bold.  The patient was last seen in the office on 02/10/2023.  At that time she had been doing well.  She called the office noting that she had a stitch that was visible.  She notes some itchiness of the breast, she denies any infectious signs or symptoms, she denies any significant wounds.  Chaperone present.  Along the left breast the incisions are clean dry and intact, there is Monocryl stitch visible along the 9 o'clock position of the left areola, as well as the inferior T-zone, otherwise the incisions are clean dry and intact no surrounding redness discharge or warmth, no palpable fluid collections.  She does have a small area of firmness along the left lateral breast.  Overall the patient is doing well, she feels a Monocryl stitch that was placed above the skin.  Given that she is not that far out from surgery I would like leave it in place until her follow-up visit.  The patient has a follow-up visit on 03/02/2023.  She is happy with today's plan, she was given strict return precautions.  She verbalized understanding and agreement to today's plan.

## 2023-02-22 ENCOUNTER — Other Ambulatory Visit: Payer: Self-pay | Admitting: Hematology and Oncology

## 2023-02-23 ENCOUNTER — Other Ambulatory Visit: Payer: Self-pay

## 2023-02-23 ENCOUNTER — Ambulatory Visit
Payer: Commercial Managed Care - PPO | Attending: Nurse Practitioner | Admitting: THORACIC SURGERY CARDIOTHORACIC VASCULAR SURGERY

## 2023-02-23 ENCOUNTER — Encounter (HOSPITAL_BASED_OUTPATIENT_CLINIC_OR_DEPARTMENT_OTHER): Payer: Self-pay | Admitting: THORACIC SURGERY CARDIOTHORACIC VASCULAR SURGERY

## 2023-02-23 ENCOUNTER — Inpatient Hospital Stay (HOSPITAL_BASED_OUTPATIENT_CLINIC_OR_DEPARTMENT_OTHER)
Admission: RE | Admit: 2023-02-23 | Discharge: 2023-02-23 | Disposition: A | Discharge status: Home / Self Care | Payer: Commercial Managed Care - PPO | Source: Ambulatory Visit

## 2023-02-23 ENCOUNTER — Inpatient Hospital Stay (HOSPITAL_COMMUNITY)
Admission: RE | Admit: 2023-02-23 | Discharge: 2023-02-23 | Disposition: A | Discharge status: Home / Self Care | Payer: Commercial Managed Care - PPO | Source: Ambulatory Visit | Attending: Nurse Practitioner | Admitting: Nurse Practitioner

## 2023-02-23 VITALS — BP 125/80 | HR 72 | Ht 61.02 in | Wt 144.0 lb

## 2023-02-23 DIAGNOSIS — Z951 Presence of aortocoronary bypass graft: Secondary | ICD-10-CM | POA: Insufficient documentation

## 2023-02-23 DIAGNOSIS — F17200 Nicotine dependence, unspecified, uncomplicated: Secondary | ICD-10-CM

## 2023-02-23 DIAGNOSIS — Z9889 Other specified postprocedural states: Secondary | ICD-10-CM | POA: Insufficient documentation

## 2023-02-23 DIAGNOSIS — Z720 Tobacco use: Secondary | ICD-10-CM | POA: Insufficient documentation

## 2023-02-24 ENCOUNTER — Ambulatory Visit (HOSPITAL_COMMUNITY): Payer: Self-pay

## 2023-02-24 ENCOUNTER — Ambulatory Visit (HOSPITAL_COMMUNITY): Payer: Commercial Managed Care - PPO

## 2023-02-24 ENCOUNTER — Encounter (HOSPITAL_COMMUNITY): Payer: Self-pay

## 2023-02-24 DIAGNOSIS — F17200 Nicotine dependence, unspecified, uncomplicated: Secondary | ICD-10-CM

## 2023-02-24 DIAGNOSIS — Z136 Encounter for screening for cardiovascular disorders: Secondary | ICD-10-CM

## 2023-02-24 LAB — ECG 12 LEAD
Atrial Rate: 64 {beats}/min
Calculated P Axis: 44 degrees
Calculated R Axis: 57 degrees
Calculated T Axis: 76 degrees
PR Interval: 184 ms
QRS Duration: 94 ms
QT Interval: 502 ms
QTC Calculation: 517 ms
Ventricular rate: 64 {beats}/min

## 2023-02-24 NOTE — Progress Notes (Signed)
Cardiothoracic Surgery, Division [facility]  [address]  [place] [address]  [phone]      Cardiac Surgery  Return Patient Progress Note    Name: Saelah Gniadek   DOB: 05/19/1956  [67 y.o. female]   MRN: [id number]       Visit Date: 02/23/2023   Referring: No referring provider defined for this encounter.   PCP: Lillia Mountain, PA-C         Care Team:     Primary Care Providers:  Lillia Mountain, PA-C (General)    Referring Provider:  No ref. provider found    Subjective:     Ms. Rohlfs  is a 67 y.o. Caucasian female who is here for 1 week post CABG evaluation. She underwent CABG X 1 with LIMA to LAD per Dr. Osvaldo Angst at [facility] on 02/06/23. She is not on Plavix secondary to significant nose bleed post op. She has had one incident that was minor since discharge. She is on ASA, BB, and statin. She is continuing AMIO taper as directed. Her BP has been stable. CXR/EKG are stable post op. She is progressing her activity. Sternal discomfort is improving. She has stable SOB with exertion. She does continue to smoke.   Problem List          Cardiovascular System    PAF (paroxysmal atrial fibrillation) (CMS HCC)    S/P CABG x 1    Chest pain    Chest pain, unspecified    CAD in native artery    Essential hypertension    Hyperlipidemia    History of coronary artery stent placement       Respiratory    Tobacco use disorder    Vanishing lung (CMS HCC)    Tobacco use       Endocrine    Prediabetes       Dermatology    Stewart-Treves syndrome (CMS HCC)  (CMS HCC)       Other    Castration complex       Past Medical History:   Diagnosis Date    Chronic lung disease     Coronary artery disease     Dyslipidemia     Essential hypertension     History of percutaneous coronary intervention     History of stress test     Unspecified chronic bronchitis (CMS HCC)           Past Surgical History:   Procedure Laterality Date    CARDIAC CATHETERIZATION      CORONARY ANGIOPLASTY      CORONARY ARTERY STENT  PLACEMENT      HX BACK SURGERY      HX MASTECTOMY, SIMPLE Bilateral     HX TUBAL LIGATION      NECK SURGERY            amiodarone (PACERONE) 200 mg Oral Tablet, 400mg  po bid x 7 days, then 400mg  po daily x 7 days,  then 200 mg po daily x 7 days, Then discontinue400mg  po bid x 7 days, then 400mg  po daily x 7 days,  then 200 mg po daily x 7 days, Then discontinue  anastrozole (ARIMIDEX) 1 mg Oral Tablet, Take 1 Tablet (1 mg total) by mouth Once a day  aspirin 81 mg Oral Tablet, Chewable, Chew 1 Tablet (81 mg total) Once a day  atorvastatin (LIPITOR) 40 mg Oral Tablet, Take 1 Tablet (40 mg total) by mouth Once a day  cholecalciferol, vitamin D3, 25  mcg (1,000 unit) Oral Tablet, Take 1 Tablet (1,000 Units total) by mouth Once a day  clonazePAM (KLONOPIN) 0.5 mg Oral Tablet, Take 1 Tablet (0.5 mg total) by mouth Twice per day as needed for Other (ANXIETY)  fluticasone propionate (FLONASE) 50 mcg/actuation Nasal Spray, Suspension, 1 Spray Once per day as needed for Other (CONGESTION)  metoprolol tartrate (LOPRESSOR) 25 mg Oral Tablet, Take 1 Tablet (25 mg total) by mouth Twice daily  MV,Ca,Ir,Min-FA-Lut-Lyco-HC153 3-133-33 mg-mcg-mcg Oral Capsule, Take 1 Capsule by mouth Once a day  omega-3 fatty acid (LOVAZA) 1 gram Oral Capsule, Take 1 Capsule (1 g total) by mouth Once a day  pantoprazole (PROTONIX) 40 mg Oral Tablet, Delayed Release (E.C.), Take 1 Tablet (40 mg total) by mouth Once a day  sertraline (ZOLOFT) 100 mg Oral Tablet, Take 1 Tablet (100 mg total) by mouth Once a day  traZODone (DESYREL) 150 mg Oral Tablet, Take 1 Tablet (150 mg total) by mouth Every night  HYDROcodone-acetaminophen (NORCO) 5-325 mg Oral Tablet, Take 1 Tablet by mouth Every 6 hours as needed  HYDROcodone-acetaminophen (NORCO) 5-325 mg Oral Tablet, Take 1 Tablet by mouth Every 6 hours as needed for Pain for up to 3 days    No facility-administered medications prior to visit.    No Known Allergies   Social History     Tobacco Use    Smoking  status: Every Day     Current packs/day: 0.50     Types: Cigarettes    Smokeless tobacco: Never   Substance Use Topics    Alcohol use: Not Currently     Comment: occasional      Family Medical History:       Problem Relation (Age of Onset)    Cancer Father    Heart Attack Father    Heart Disease Brother    Pacemaker Mother             Review of Systems  Other than ROS in the HPI, all other systems were negative.    Objective:     BP 125/80 (Site: Left, Patient Position: Sitting)   Pulse 72   Ht 1.55 m (5' 1.02")   Wt 65.3 kg (144 lb)   SpO2 95%   BMI 27.19 kg/m       Physical Exam:  General appearance: Appears healthy.  Alert; in no acute distress.  Pleasant.  Cardiac: negative RRR   Sternum stable   Respiratory: Respiratory effort normal and clear to auscultation  Abdomen: soft, non-tender. Bowel sounds normal. No masses,  no organomegaly  Extremities: no cyanosis, clubbing or edema present    Surgical Incisions:   Chest: well healing, without drainage  Extremities: No edema    Data Review  Chest xray: FINDINGS:     Mild left basilar atelectasis or infiltrate and trace pleural effusion. Findings appear improved. Right lung appears clear with improved aeration right lower lung zone. No pneumothorax.     Heart size is borderline. Pulmonary vascularity is borderline.      Postop changes of CABG. Postop changes cervical spine.     IMPRESSION:     Chest improved  EKG:   Interpretation Summary    Normal sinus rhythm  Nonspecific T wave abnormality  Prolonged QT  Abnormal ECG  When compared with ECG of 09-Feb-2023 20:51,  Significant changes have occurred  Confirmed by Lucas Mallow ([id number]) on 02/24/2023 11:51:16 AM    Assessment:     Problem List Items Addressed This Visit  Tobacco use    S/P CABG x 1     Other Visit Diagnoses       Post-operative state    -  Primary    Relevant Orders    XR CHEST AP AND LATERAL    S/P CABG (coronary artery bypass graft)        Relevant Orders    XR CHEST AP AND LATERAL             67 year old white female S/P CABG X 1    Plan:     RTO in 2 weeks with CXR  Continues to follow with Dr. Kirk Ruths  No medication changes today  Cardiac rehab planned for [facility]  Tobacco cessation discussed    Karma Ganja, FNP-BC 02/24/2023, 14:08

## 2023-03-01 NOTE — Progress Notes (Signed)
[name] a pleasant 66 year old female with PMH of partial mastectomy and radiation s/p left-sided breast reduction for symmetry performed 02/09/2023 by Dr. Ulice Bold who presents to clinic for postoperative follow-up.  She was last seen here in clinic on 02/21/2023.  At that time, she was doing well.  Few scattered sutures were noted and left in place, but otherwise exam was benign.  Today, patient is doing well.  She is pleased with the outcome of her surgery and improved symmetry of breasts.  She states that she has not once required her narcotic analgesics.  She has been applying Vaseline to the areola, as discussed at last visit.  She has some mild itching sensation across chest bilaterally, but no other allergic symptoms.  She believes it is attributable to her compressive bra.  On exam, improved symmetry of breasts.  Left breast is healing nicely.  Soft throughout.  NAC is healthy.  Scattered sutures removed without complication or difficulty.  Some residual Dermabond noted.  No subcutaneous fluid collections appreciated.  Continue with compressive garments for 1 more week and at that time can transition to a bra that perhaps will be more comfortable and not provoke itching sensation.  Recommend trying second-generation antihistamines, as well.  Ongoing activity restrictions until subsequent encounter.  Will discuss scar treatments at that time.  Continued Vaseline over incisions throughout.  Picture(s) obtained of the patient and placed in the chart were with the patient's or guardian's permission.

## 2023-03-02 ENCOUNTER — Ambulatory Visit (INDEPENDENT_AMBULATORY_CARE_PROVIDER_SITE_OTHER): Payer: Medicare HMO | Admitting: Physician Assistant

## 2023-03-02 DIAGNOSIS — Z9889 Other specified postprocedural states: Secondary | ICD-10-CM

## 2023-03-02 DIAGNOSIS — N6489 Other specified disorders of breast: Secondary | ICD-10-CM

## 2023-03-03 ENCOUNTER — Encounter: Payer: Medicare HMO | Admitting: Physician Assistant

## 2023-03-08 ENCOUNTER — Encounter (INDEPENDENT_AMBULATORY_CARE_PROVIDER_SITE_OTHER): Payer: Self-pay | Admitting: Family

## 2023-03-09 ENCOUNTER — Encounter (HOSPITAL_BASED_OUTPATIENT_CLINIC_OR_DEPARTMENT_OTHER): Payer: Self-pay | Admitting: THORACIC SURGERY CARDIOTHORACIC VASCULAR SURGERY

## 2023-03-10 DIAGNOSIS — L821 Other seborrheic keratosis: Secondary | ICD-10-CM | POA: Diagnosis not present

## 2023-03-10 DIAGNOSIS — L820 Inflamed seborrheic keratosis: Secondary | ICD-10-CM | POA: Diagnosis not present

## 2023-03-16 ENCOUNTER — Ambulatory Visit (HOSPITAL_COMMUNITY): Payer: Self-pay

## 2023-03-16 ENCOUNTER — Encounter: Payer: Self-pay | Admitting: Physician Assistant

## 2023-03-16 ENCOUNTER — Ambulatory Visit (INDEPENDENT_AMBULATORY_CARE_PROVIDER_SITE_OTHER): Payer: Medicare HMO | Admitting: Physician Assistant

## 2023-03-16 VITALS — BP 129/84 | HR 95

## 2023-03-16 DIAGNOSIS — Z719 Counseling, unspecified: Secondary | ICD-10-CM

## 2023-03-16 DIAGNOSIS — N6489 Other specified disorders of breast: Secondary | ICD-10-CM

## 2023-03-16 NOTE — Progress Notes (Signed)
[name] a pleasant 66 year old female with PMH of partial mastectomy and radiation s/p left-sided breast reduction for symmetry performed 02/09/2023 by Dr. Ulice Bold who presents to clinic for postoperative follow-up.   She was last seen here in clinic on 03/02/2023.  At that time, her exam was entirely benign.  Scattered sutures removed without complication.  Some residual Dermabond noted.  She did complain of some itching bilaterally, but no other allergic symptoms.  Recommended antihistamines and Vaseline over the incisions throughout.  Today, patient states that her itching has largely resolved.  [name] mildly bothered by skin bunching and firmness at bottom of her vertical incision.  She denies any other specific complaints and feels as though her residual Dermabond has all fallen off.  She inquires about any ongoing restrictions.  On exam, breasts have excellent shape and symmetry.  NAC is healthy.  Lesions are all well-healed.  Good epithelialization throughout.  No incisional wounds.  No residual Dermabond or scabbing.  All of the sutures have already been removed.  Mild firmness underneath the inferior aspect of vertical limb with minimal skin bunching appreciated.  Recommend continued gentle massage of the inferior aspect of vertical limb.  At this time, given that she is well-healed, she can transition to using silicone scar gels twice daily x 3 months.  As for activity restrictions, increase as tolerated.  Cleared from a postoperative standpoint.  Pictures already obtained and placed in chart at last visit.  She can follow-up in 6 months with Dr. Ulice Bold if she is still bothered by the inferior aspect of vertical limb on left breast, but suspect that it will smooth out with time and gentle massage.  [name] otherwise quite pleased with the outcome of the reduction for symmetry.

## 2023-03-17 ENCOUNTER — Encounter: Payer: Medicare HMO | Admitting: Physician Assistant

## 2023-03-20 DIAGNOSIS — R69 Illness, unspecified: Secondary | ICD-10-CM | POA: Diagnosis not present

## 2023-04-06 ENCOUNTER — Ambulatory Visit (HOSPITAL_COMMUNITY): Payer: Self-pay

## 2023-05-01 ENCOUNTER — Telehealth (HOSPITAL_COMMUNITY): Payer: Self-pay | Admitting: THORACIC SURGERY CARDIOTHORACIC VASCULAR SURGERY

## 2023-05-01 NOTE — Telephone Encounter (Signed)
Called and spoke with patient today for an STS quality review follow up after cardiac surgery.  Her incisions are healed but numb so I told her she could massage the area. She has not been readmitted since d/c.Maureen Chatters, RN

## 2023-05-11 ENCOUNTER — Other Ambulatory Visit: Payer: Self-pay

## 2023-06-19 DIAGNOSIS — R69 Illness, unspecified: Secondary | ICD-10-CM | POA: Diagnosis not present

## 2023-06-20 ENCOUNTER — Ambulatory Visit (INDEPENDENT_AMBULATORY_CARE_PROVIDER_SITE_OTHER): Payer: Medicare HMO

## 2023-06-20 ENCOUNTER — Telehealth: Payer: Self-pay

## 2023-06-20 DIAGNOSIS — R69 Illness, unspecified: Secondary | ICD-10-CM | POA: Diagnosis not present

## 2023-06-20 DIAGNOSIS — Z23 Encounter for immunization: Secondary | ICD-10-CM

## 2023-06-20 NOTE — Progress Notes (Signed)
Per orders of Dr. Ardyth Harps, injection of Flu Vaccine  given by Stann Ore. Patient tolerated injection well.

## 2023-06-21 NOTE — Telephone Encounter (Signed)
Spoke with patient she is aware

## 2023-07-18 ENCOUNTER — Inpatient Hospital Stay: Payer: Medicare HMO | Attending: Hematology and Oncology | Admitting: Hematology and Oncology

## 2023-07-18 VITALS — BP 135/78 | HR 78 | Temp 97.3°F | Resp 18 | Ht 65.0 in | Wt 181.6 lb

## 2023-07-18 DIAGNOSIS — Z780 Asymptomatic menopausal state: Secondary | ICD-10-CM

## 2023-07-18 DIAGNOSIS — Z79811 Long term (current) use of aromatase inhibitors: Secondary | ICD-10-CM | POA: Insufficient documentation

## 2023-07-18 DIAGNOSIS — Z923 Personal history of irradiation: Secondary | ICD-10-CM | POA: Diagnosis not present

## 2023-07-18 DIAGNOSIS — N951 Menopausal and female climacteric states: Secondary | ICD-10-CM | POA: Insufficient documentation

## 2023-07-18 DIAGNOSIS — Z170 Estrogen receptor positive status [ER+]: Secondary | ICD-10-CM | POA: Insufficient documentation

## 2023-07-18 DIAGNOSIS — C50211 Malignant neoplasm of upper-inner quadrant of right female breast: Secondary | ICD-10-CM | POA: Diagnosis not present

## 2023-07-18 DIAGNOSIS — Z9221 Personal history of antineoplastic chemotherapy: Secondary | ICD-10-CM | POA: Insufficient documentation

## 2023-07-18 MED ORDER — ANASTROZOLE 1 MG PO TABS: 1.0000 mg | ORAL_TABLET | Freq: Every day | ORAL | 3 refills | Status: DC

## 2023-07-18 NOTE — Assessment & Plan Note (Signed)
09/27/19: Two right breast masses 12:00 to 1 o'clock position 1.3 cm and 1.2 cm, they are 1.5 cm apart.  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 Neoadjuvant chemotherapy with Taxol Herceptin weekly x12 completed 01/24/2019 followed by Herceptin maintenance vs Kadcyla for 1 year completed 10/24/2019 2.  03/08/2019: Right lumpectomy IDC s/p neoadjuvant treatment, grade 2, 0.7cm, ER+ 100%, PR+ 5%, HER2 equivocal, Ki67 20%, 0/2 right axillary sentinel lymph nodes negative for carcinoma, with clear margins.  3. Followed by adjuvant radiation therapy completed 05/06/2019 4.  Followed by antiestrogen therapy started 10/02/2019 ------------------------------------------------------------------------------------------------------------------------------------------------------------ Current Treatment: Anastrozole 1 mg daily started 10/01/20   Anastrozole Toxicities:  1.  Mild to moderate hot flashes 2. mild to moderate joint stiffness: Improved with exercise 3.  Weight issues:    Bone density has been ordered for February 2023.   Breast Cancer Surveillance: 1. Breast Exam: 07/18/2023: Benign 2. Mammograms:  [facility] 10/13/2022: Benign, breast density category A   Bone density 12/21/2021: T score -1: Normal    RTC in 1 year for follow up

## 2023-07-18 NOTE — Progress Notes (Signed)
Patient Care Team: Philip Aspen, Limmie Patricia, MD as PCP - General (Internal Medicine) Pershing Proud, RN as Oncology Nurse Navigator Donnelly Angelica, RN as Oncology Nurse Navigator Lonie Peak, MD as Attending Physician (Radiation Oncology) Serena Croissant, MD as Consulting Physician (Hematology and Oncology) Griselda Miner, MD as Consulting Physician (General Surgery) Cynda Familia, RN (Inactive) as Registered Nurse  DIAGNOSIS:  Encounter Diagnoses  Name Primary?   Malignant neoplasm of upper-inner quadrant of right breast in female, estrogen receptor positive (HCC) Yes   Post-menopausal     SUMMARY OF ONCOLOGIC HISTORY: Oncology History  Malignant neoplasm of upper-inner quadrant of right breast in female, estrogen receptor positive (HCC)  09/26/2018 Initial Diagnosis   Two right breast masses 12:00 to 1 o'clock position 1.3 cm and 1.2 cm, they are 1.5 cm apart.  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 Dose modification: 80 mg/m2 (original dose 80 mg/m2, Cycle 1). Administration: 150 mg (11/29/2018), 150 mg (12/06/2018), 150 mg (12/13/2018), 150 mg (12/20/2018), 150 mg (12/27/2018), 150 mg (01/03/2019), 150 mg (01/10/2019), 150 mg (01/17/2019), 150 mg (01/24/2019)  ondansetron (ZOFRAN) 8 mg in sodium chloride 0.9 % 50 mL IVPB, 8 mg (100 % of original dose 8 mg), Intravenous,  Once, 1 of 1 cycle. Dose modification: 8 mg (original dose 8 mg, Cycle  1)  trastuzumab (HERCEPTIN) 336 mg in sodium chloride 0.9 % 250 mL chemo infusion, 4 mg/kg = 336 mg, Intravenous,  Once, 5 of 16 cycles. Dose modification: 6 mg/kg (original dose 2 mg/kg, Cycle 3, Reason: Other (see comments), Comment: changing to q3week tx). Administration: 336 mg (11/08/2018), 168 mg (11/15/2018), 168 mg (12/06/2018), 504 mg (02/14/2019), 504 mg (03/05/2019), 168 mg (11/22/2018), 168 mg (11/29/2018), 168 mg (12/13/2018), 168 mg (12/20/2018), 168 mg (12/27/2018), 168 mg (01/03/2019), 168 mg (01/10/2019), 168 mg (01/17/2019), 504 mg (01/24/2019)  PACLitaxel (TAXOL) 156 mg in sodium chloride 0.9 % 250 mL chemo infusion (</= 80mg /m2), 80 mg/m2 = 156 mg, Intravenous,  Once, 1 of 1 cycle. Administration: 156 mg (11/08/2018), 156 mg (11/15/2018), 156 mg (11/22/2018)  ado-trastuzumab emtansine (KADCYLA) 300 mg in sodium chloride 0.9 % 250 mL chemo infusion, 3.6 mg/kg = 300 mg, Intravenous, Once, 11 of 11 cycles. Administration: 300 mg (03/28/2019), 300 mg (04/18/2019), 300 mg (06/21/2019), 300 mg (05/09/2019), 300 mg (05/30/2019), 300 mg (07/11/2019), 300 mg (08/01/2019), 300 mg (08/22/2019), 300 mg (09/12/2019), 260 mg (10/02/2019), 260 mg (10/24/2019)    01/09/2019 Genetic Testing   Negative genetic testing on the common hereditary cancer panel. The Hereditary Gene Panel offered by [facility] includes sequencing and/or deletion duplication testing of the following 48 genes: APC, ATM, AXIN2, BARD1, BMPR1A, BRCA1, BRCA2, BRIP1, CDH1, CDK4, CDKN2A (p14ARF), CDKN2A (p16INK4a), CHEK2, CTNNA1, DICER1, EPCAM (Deletion/duplication testing only), GREM1 (promoter region deletion/duplication testing only), KIT, MEN1, MLH1, MSH2, MSH3, MSH6, MUTYH, NBN, NF1, NHTL1, PALB2, PDGFRA, PMS2, POLD1, POLE, PTEN, RAD50, RAD51C, RAD51D,  RNF43, SDHB, SDHC, SDHD, SMAD4, SMARCA4. STK11, TP53, TSC1, TSC2, and VHL.  The following genes were evaluated for sequence changes only: SDHA and HOXB13 c.251G>A variant only. The report date is January 09, 2019.    03/08/2019 Surgery   Right lumpectomy Carolynne Edouard) [phone]): IDC s/p neoadjuvant treatment, grade 2, 0.7cm, ER+ 100%, PR+ 5%, HER2 equivocal, Ki67 20%, 2 right axillary sentinel lymph nodes negative for carcinoma, with clear margins.    03/29/2019 Cancer Staging   Staging form: Breast, AJCC 8th Edition - Pathologic stage from 03/29/2019: No Stage Recommended (ypT1b, pN0, cM0, G2, ER+, PR+, HER2: Equivocal)     04/08/2019 - 05/06/2019 Radiation Therapy   The patient initially received a dose of 40.05 Gy in 15 fractions to the breast using whole-breast tangent fields. This was delivered using a 3-D conformal technique. The pt received a boost delivering an additional 10 Gy in 5 fractions using a electron boost with electrons. The total dose was 50.05 Gy.    09/2019 - 09/2024 Anti-estrogen oral therapy   Anastrozole     CHIEF COMPLIANT: Follow-up on anastrozole therapy  History of Present Illness   The patient, with a history of breast cancer, is on anastrozole for about three and a half years. She reports manageable hot flashes and joint stiffness, which are not daily occurrences. She underwent a reduction on the left side for symmetry in May. She has been maintaining regular mammograms, which have been coming out good. She is due for a bone density test in February. She has been trying to stay active, but acknowledges the need to do more. She reports some changes in taste and stomach upset with certain foods since chemotherapy.     ALLERGIES:  is allergic to paclitaxel.  MEDICATIONS:  Current Outpatient Medications  Medication Sig Dispense Refill   Calcium Carbonate-Vit D-Min (CALCIUM 1200 PO) Take by mouth.     cholecalciferol (VITAMIN D3) 25 MCG (1000 UNIT) tablet Take 1,000 Units by mouth daily.     cyanocobalamin (VITAMIN B12) 1000 MCG tablet Take 1,000 mcg by mouth daily.     Polyethyl Glycol-Propyl Glycol (SYSTANE FREE OP) Apply to eye at bedtime as needed.     anastrozole  (ARIMIDEX) 1 MG tablet Take 1 tablet (1 mg total) by mouth daily. 90 tablet 3   No current facility-administered medications for this visit.    PHYSICAL EXAMINATION: ECOG PERFORMANCE STATUS: 1 - Symptomatic but completely ambulatory  Vitals:   07/18/23 0955  BP: 135/78  Pulse: 78  Resp: 18  Temp: (!) 97.3 F (36.3 C)  SpO2: 100%   Filed Weights   07/18/23 0955  Weight: 181 lb 9.6 oz (82.4 kg)      LABORATORY DATA:  I have reviewed the data as listed    Latest Ref Rng & Units 08/11/2022   10:58 AM 08/24/2021    8:25 AM 08/18/2020   10:27 AM  CMP  Glucose 70 - 99 mg/dL 92  96  84   BUN 6 - 23 mg/dL 9  13  9    Creatinine 0.40 - 1.20 mg/dL 4.09  8.11  9.14   Sodium 135 - 145 mEq/L 140  139  142   Potassium 3.5 - 5.1 mEq/L 4.9  4.2  5.0   Chloride 96 - 112 mEq/L 103  102  101   CO2 19 - 32 mEq/L 30  30  26    Calcium 8.4 - 10.5 mg/dL 9.9  9.5  9.8   Total Protein 6.0 -  8.3 g/dL 7.7  7.6  7.5   Total Bilirubin 0.2 - 1.2 mg/dL 0.7  0.7  0.7   Alkaline Phos 39 - 117 U/L 107  143  161   AST 0 - 37 U/L 15  15  18    ALT 0 - 35 U/L 9  12  17      Lab Results  Component Value Date   WBC 5.8 08/11/2022   HGB 13.3 08/11/2022   HCT 40.0 08/11/2022   MCV 95.5 08/11/2022   PLT 259.0 08/11/2022   NEUTROABS 4.2 08/11/2022    ASSESSMENT & PLAN:  Malignant neoplasm of upper-inner quadrant of right breast in female, estrogen receptor positive (HCC) 09/27/19: Two right breast masses 12:00 to 1 o'clock position 1.3 cm and 1.2 cm, they are 1.5 cm apart.  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 Neoadjuvant chemotherapy with Taxol Herceptin weekly x12 completed 01/24/2019 followed by Herceptin maintenance vs Kadcyla for 1 year completed 10/24/2019 2.  03/08/2019: Right lumpectomy IDC s/p neoadjuvant  treatment, grade 2, 0.7cm, ER+ 100%, PR+ 5%, HER2 equivocal, Ki67 20%, 0/2 right axillary sentinel lymph nodes negative for carcinoma, with clear margins.  3. Followed by adjuvant radiation therapy completed 05/06/2019 4.  Followed by antiestrogen therapy started 10/02/2019 ------------------------------------------------------------------------------------------------------------------------------------------------------------ Current Treatment: Anastrozole 1 mg daily started 10/01/20   Anastrozole Toxicities:  1.  Mild to moderate hot flashes 2. mild to moderate joint stiffness: Improved with exercise 3.  Weight issues:    Bone density has been ordered for February 2023.   Breast Cancer Surveillance: 1. Mammograms:  [facility] 10/13/2022: Benign, breast density category A   Bone density 12/21/2021: T score -1: Normal     Physical Activity Reports inconsistent exercise routine. -Increase physical activity  Return to clinic in 1 year for follow-up   Orders Placed This Encounter  Procedures   DG Bone Density    Standing Status:   Future    Standing Expiration Date:   07/17/2024    Order Specific Question:   Reason for Exam (SYMPTOM  OR DIAGNOSIS REQUIRED)    Answer:   Post menopausal    Comments:   [facility]    Order Specific Question:   Preferred imaging location?    Answer:   External    Order Specific Question:   Release to patient    Answer:   Immediate   The patient has a good understanding of the overall plan. she agrees with it. she will call with any problems that may develop before the next visit here. Total time spent: 30 mins including face to face time and time spent for planning, charting and co-ordination of care   Tamsen Meek, MD 07/18/23

## 2023-07-27 ENCOUNTER — Encounter (INDEPENDENT_AMBULATORY_CARE_PROVIDER_SITE_OTHER): Payer: Self-pay | Admitting: Family

## 2023-07-28 ENCOUNTER — Other Ambulatory Visit (HOSPITAL_BASED_OUTPATIENT_CLINIC_OR_DEPARTMENT_OTHER): Payer: Self-pay

## 2023-07-28 MED ORDER — RSVPREF3 VAC RECOMB ADJUVANTED 120 MCG/0.5ML IM SUSR
0.5000 mL | Freq: Once | INTRAMUSCULAR | 0 refills | Status: AC
  Filled 2023-07-28: qty 0.5, 1d supply, fill #0

## 2023-08-14 ENCOUNTER — Encounter: Payer: Medicare HMO | Admitting: Internal Medicine

## 2023-08-15 ENCOUNTER — Ambulatory Visit (INDEPENDENT_AMBULATORY_CARE_PROVIDER_SITE_OTHER): Payer: Medicare HMO | Admitting: Internal Medicine

## 2023-08-15 ENCOUNTER — Encounter: Payer: Self-pay | Admitting: Internal Medicine

## 2023-08-15 VITALS — BP 124/80 | HR 85 | Temp 98.4°F | Ht 65.0 in | Wt 175.7 lb

## 2023-08-15 DIAGNOSIS — E538 Deficiency of other specified B group vitamins: Secondary | ICD-10-CM | POA: Diagnosis not present

## 2023-08-15 DIAGNOSIS — E559 Vitamin D deficiency, unspecified: Secondary | ICD-10-CM

## 2023-08-15 DIAGNOSIS — R7302 Impaired glucose tolerance (oral): Secondary | ICD-10-CM | POA: Diagnosis not present

## 2023-08-15 DIAGNOSIS — Z170 Estrogen receptor positive status [ER+]: Secondary | ICD-10-CM | POA: Diagnosis not present

## 2023-08-15 DIAGNOSIS — C50211 Malignant neoplasm of upper-inner quadrant of right female breast: Secondary | ICD-10-CM

## 2023-08-15 DIAGNOSIS — Z0000 Encounter for general adult medical examination without abnormal findings: Secondary | ICD-10-CM

## 2023-08-15 LAB — COMPREHENSIVE METABOLIC PANEL
ALT: 10 U/L (ref 0–35)
AST: 15 U/L (ref 0–37)
Albumin: 4.3 g/dL (ref 3.5–5.2)
Alkaline Phosphatase: 119 U/L — ABNORMAL HIGH (ref 39–117)
BUN: 12 mg/dL (ref 6–23)
CO2: 31 meq/L (ref 19–32)
Calcium: 9.5 mg/dL (ref 8.4–10.5)
Chloride: 103 meq/L (ref 96–112)
Creatinine, Ser: 0.78 mg/dL (ref 0.40–1.20)
GFR: 79.17 mL/min (ref >60.00)
Glucose, Bld: 94 mg/dL (ref 70–99)
Potassium: 3.9 meq/L (ref 3.5–5.1)
Sodium: 141 meq/L (ref 135–145)
Total Bilirubin: 0.8 mg/dL (ref 0.2–1.2)
Total Protein: 7.4 g/dL (ref 6.0–8.3)

## 2023-08-15 LAB — CBC WITH DIFFERENTIAL/PLATELET
Basophils Absolute: 0 10*3/uL (ref 0.0–0.1)
Basophils Relative: 0.9 % (ref 0.0–3.0)
Eosinophils Absolute: 0.1 10*3/uL (ref 0.0–0.7)
Eosinophils Relative: 2.4 % (ref 0.0–5.0)
HCT: 39.1 % (ref 36.0–46.0)
Hemoglobin: 12.7 g/dL (ref 12.0–15.0)
Lymphocytes Relative: 24.3 % (ref 12.0–46.0)
Lymphs Abs: 1.2 10*3/uL (ref 0.7–4.0)
MCHC: 32.4 g/dL (ref 30.0–36.0)
MCV: 95 fL (ref 78.0–100.0)
Monocytes Absolute: 0.4 10*3/uL (ref 0.1–1.0)
Monocytes Relative: 7.4 % (ref 3.0–12.0)
Neutro Abs: 3.2 10*3/uL (ref 1.4–7.7)
Neutrophils Relative %: 65 % (ref 43.0–77.0)
Platelets: 255 10*3/uL (ref 150.0–400.0)
RBC: 4.12 Mil/uL (ref 3.87–5.11)
RDW: 13.9 % (ref 11.5–15.5)
WBC: 4.9 10*3/uL (ref 4.0–10.5)

## 2023-08-15 LAB — LIPID PANEL
Cholesterol: 135 mg/dL (ref 0–200)
HDL: 58.5 mg/dL (ref >39.00)
LDL Cholesterol: 60 mg/dL (ref 0–99)
NonHDL: 76.58
Total CHOL/HDL Ratio: 2
Triglycerides: 84 mg/dL (ref 0.0–149.0)
VLDL: 16.8 mg/dL (ref 0.0–40.0)

## 2023-08-15 LAB — VITAMIN B12: Vitamin B-12: 1270 pg/mL — ABNORMAL HIGH (ref 211–911)

## 2023-08-15 LAB — VITAMIN D 25 HYDROXY (VIT D DEFICIENCY, FRACTURES): VITD: 54.27 ng/mL (ref 30.00–100.00)

## 2023-08-15 LAB — TSH: TSH: 1.29 u[IU]/mL (ref 0.35–5.50)

## 2023-08-15 LAB — HEMOGLOBIN A1C: Hgb A1c MFr Bld: 5.7 % (ref 4.6–6.5)

## 2023-08-15 NOTE — Progress Notes (Signed)
Established Patient Office Visit     CC/Reason for Visit: Annual preventive exam and subsequent Medicare wellness visit  HPI: Judith Thomas is a 67 y.o. female who is coming in today for the above mentioned reasons. Past Medical History is significant for: Impaired glucose tolerance, history of right breast cancer, vitamin D and B12 deficiencies.  Has been feeling well without acute concerns or complaints.  Has routine eye and dental care, no perceived hearing difficulties.  All immunizations are up-to-date with the exception of COVID.  All cancer screening and DEXA scan are up-to-date.   Past Medical/Surgical History: Past Medical History:  Diagnosis Date   Breast cancer (HCC)    right   Family history of bladder cancer    Family history of breast cancer    Knee pain, chronic 10/11/2015    Past Surgical History:  Procedure Laterality Date   BREAST LUMPECTOMY WITH RADIOACTIVE SEED AND SENTINEL LYMPH NODE BIOPSY Right 03/08/2019   Procedure: RIGHT BREAST RADIOACTIVE SEED LUMPECTOMY X2 AND SENTINEL LYMPH NODE BIOPSY;  Surgeon: Griselda Miner, MD;  Location: [facility];  Service: General;  Laterality: Right;   BREAST REDUCTION SURGERY Left 02/09/2023   Procedure: BREAST REDUCTION WITH LIPOSUCTION;  Surgeon: Peggye Form, DO;  Location: [facility];  Service: Plastics;  Laterality: Left;   COLONOSCOPY  03/2010   none     PORT-A-CATH REMOVAL Left 12/30/2019   Procedure: REMOVAL PORT-A-CATH;  Surgeon: Griselda Miner, MD;  Location: [facility];  Service: General;  Laterality: Left;   PORTACATH PLACEMENT Left 11/07/2018   Procedure: INSERTION PORT-A-CATH WITH ULTRASOUND;  Surgeon: Griselda Miner, MD;  Location: [facility];  Service: General;  Laterality: Left;    Social History:  reports that she has never smoked. She has never used smokeless tobacco. She reports current alcohol use. She reports that she does not use  drugs.  Allergies: Allergies  Allergen Reactions   Paclitaxel Other (See Comments)    Nausea, flushing    Family History:  Family History  Problem Relation Age of Onset   Liver disease Mother    Heart disease Mother        afib   Heart disease Father    Asthma Father    Diabetes Father    Bladder Cancer Father        smoker   Breast cancer Maternal Aunt    Breast cancer Maternal Aunt        mets to bone   Heart disease Maternal Grandfather    Kidney disease Paternal Grandmother    Heart disease Paternal Grandfather    Heart disease Other    Colon cancer Neg Hx    Colon polyps Neg Hx    Esophageal cancer Neg Hx    Rectal cancer Neg Hx    Stomach cancer Neg Hx      Current Outpatient Medications:    anastrozole (ARIMIDEX) 1 MG tablet, Take 1 tablet (1 mg total) by mouth daily., Disp: 90 tablet, Rfl: 3   Calcium Carbonate-Vit D-Min (CALCIUM 1200 PO), Take by mouth., Disp: , Rfl:    cholecalciferol (VITAMIN D3) 25 MCG (1000 UNIT) tablet, Take 1,000 Units by mouth daily., Disp: , Rfl:    cyanocobalamin (VITAMIN B12) 1000 MCG tablet, Take 1,000 mcg by mouth daily., Disp: , Rfl:    Polyethyl Glycol-Propyl Glycol (SYSTANE FREE OP), Apply to eye at bedtime as needed., Disp: , Rfl:   Review  of Systems:  Negative unless indicated in HPI.   Physical Exam: Vitals:   08/15/23 0758  BP: 124/80  Pulse: 85  Temp: 98.4 F (36.9 C)  TempSrc: Oral  SpO2: 97%  Weight: 175 lb 11.2 oz (79.7 kg)  Height: 5\' 5"  (1.651 m)    Body mass index is 29.24 kg/m.   Physical Exam Vitals reviewed.  Constitutional:      General: She is not in acute distress.    Appearance: Normal appearance. She is not ill-appearing, toxic-appearing or diaphoretic.  HENT:     Head: Normocephalic.     Right Ear: Tympanic membrane, ear canal and external ear normal. There is no impacted cerumen.     Left Ear: Tympanic membrane, ear canal and external ear normal. There is no impacted cerumen.      Nose: Nose normal.     Mouth/Throat:     Mouth: Mucous membranes are moist.     Pharynx: Oropharynx is clear. No oropharyngeal exudate or posterior oropharyngeal erythema.  Eyes:     General: No scleral icterus.       Right eye: No discharge.        Left eye: No discharge.     Conjunctiva/sclera: Conjunctivae normal.     Pupils: Pupils are equal, round, and reactive to light.  Neck:     Vascular: No carotid bruit.  Cardiovascular:     Rate and Rhythm: Normal rate and regular rhythm.     Pulses: Normal pulses.     Heart sounds: Normal heart sounds.  Pulmonary:     Effort: Pulmonary effort is normal. No respiratory distress.     Breath sounds: Normal breath sounds.  Abdominal:     General: Abdomen is flat. Bowel sounds are normal.     Palpations: Abdomen is soft.  Musculoskeletal:        General: Normal range of motion.     Cervical back: Normal range of motion.  Skin:    General: Skin is warm and dry.  Neurological:     General: No focal deficit present.     Mental Status: She is alert and oriented to person, place, and time. Mental status is at baseline.  Psychiatric:        Mood and Affect: Mood normal.        Behavior: Behavior normal.        Thought Content: Thought content normal.        Judgment: Judgment normal.    Subsequent Medicare wellness visit   1. Risk factors, based on past  M,S,F - Cardiac Risk Factors include: advanced age (>56men, >70 women)   2.  Physical activities: Dietary issues and exercise activities discussed:      3.  Depression/mood:  Flowsheet Row Office Visit from 08/15/2023 in [facility] Total Score 0        4.  ADL's:    08/14/2023    4:33 PM 02/09/2023    6:38 AM  In your present state of health, do you have any difficulty performing the following activities:  Hearing? 0 0  Vision? 0 0  Difficulty concentrating or making decisions? 0 0  Walking or climbing stairs? 0 0  Dressing or bathing?  0 0  Doing errands, shopping? 0   Preparing Food and eating ? N   Using the Toilet? N   In the past six months, have you accidently leaked urine? N   Do you have problems with loss  of bowel control? N   Managing your Medications? N   Managing your Finances? N   Housekeeping or managing your Housekeeping? N      5.  Fall risk:     08/18/2020    9:21 AM 08/24/2021    7:54 AM 12/23/2021   10:01 AM 07/14/2022   10:00 AM 08/11/2022   10:26 AM  Fall Risk  Falls in the past year? 0 0 0  0  Was there an injury with Fall?  0 0  0  Fall Risk Category Calculator  0 0  0  Fall Risk Category (Retired)  Low Low  Low  (RETIRED) Patient Fall Risk Level   Low fall risk Low fall risk Low fall risk  Patient at Risk for Falls Due to   No Fall Risks  No Fall Risks  Fall risk Follow up   Falls evaluation completed  Falls evaluation completed     6.  Home safety: No problems identified   7.  Height weight, and visual acuity: height and weight as above, vision/hearing: Vision Screening   Right eye Left eye Both eyes  Without correction 20/30 20/30 20/30   With correction        8.  Counseling: Counseling given: Not Answered    9. Lab orders based on risk factors: Laboratory update will be reviewed   10. Cognitive assessment:        08/14/2023    4:35 PM  6CIT Screen  What Year? 0 points  What month? 0 points  What time? 0 points  Count back from 20 0 points  Months in reverse 0 points  Repeat phrase 0 points  Total Score 0 points     11. Screening: Patient provided with a written and personalized 5-10 year screening schedule in the AVS. Health Maintenance  Topic Date Due   COVID-19 Vaccine (6 - 2023-24 season) 06/11/2023   Mammogram  10/13/2023   Medicare Annual Wellness Visit  08/14/2024   Colon Cancer Screening  05/03/2028   DTaP/Tdap/Td vaccine (3 - Td or Tdap) 08/25/2031   Pneumonia Vaccine  Completed   Flu Shot  Completed   DEXA scan (bone density measurement)  Completed    Hepatitis C Screening  Completed   Zoster (Shingles) Vaccine  Completed   HPV Vaccine  Aged Out    12. Provider List Update: Patient Care Team    Relationship Specialty Notifications Start End  Philip Aspen, Limmie Patricia, MD PCP - General Internal Medicine  08/24/21   Pershing Proud, RN Oncology Nurse Navigator   01/25/19   Donnelly Angelica, RN Oncology Nurse Navigator   01/25/19   Lonie Peak, MD Attending Physician Radiation Oncology  12/12/19   Serena Croissant, MD Consulting Physician Hematology and Oncology  12/12/19   Griselda Miner, MD Consulting Physician General Surgery  12/12/19   Cynda Familia, RN (Inactive) Registered Nurse  Abnormal results only, Admissions 03/25/20    Comment: Clinical research nurse     13. Advance Directives: Does Patient Have a Medical Advance Directive?: Yes Type of Advance Directive: Healthcare Power of Attorney, Living will, Out of facility DNR (pink MOST or yellow form) Does patient want to make changes to medical advance directive?: No - Patient declined Copy of Healthcare Power of Attorney in Chart?: No - copy requested  14. Opioids: [name] not on any opioid prescriptions and has no risk factors for a substance use disorder.   15.   Goals  Activity and Exercise Increased     Evidence-based guidance:  Review current exercise levels.  Assess patient perspective on exercise or activity level, barriers to increasing activity, motivation and readiness for change.  Recommend or set healthy exercise goal based on individual tolerance.  Encourage small steps toward making change in amount of exercise or activity.  Urge reduction of sedentary activities or screen time.  Promote group activities within the community or with family or support person.  Consider referral to rehabiliation therapist for assessment and exercise/activity plan.   Notes:          I have personally reviewed and noted the following in the patient's chart:    Medical and social history Use of alcohol, tobacco or illicit drugs  Current medications and supplements Functional ability and status Nutritional status Physical activity Advanced directives List of other physicians Hospitalizations, surgeries, and ER visits in previous 12 months Vitals Screenings to include cognitive, depression, and falls Referrals and appointments  In addition, I have reviewed and discussed with patient certain preventive protocols, quality metrics, and best practice recommendations. A written personalized care plan for preventive services as well as general preventive health recommendations were provided to patient.   Impression and Plan:  Medicare annual wellness visit, subsequent  IGT (impaired glucose tolerance) -     Lipid panel; Future -     Hemoglobin A1c; Future  Vitamin D deficiency -     VITAMIN D 25 Hydroxy (Vit-D Deficiency, Fractures); Future  Vitamin B12 deficiency -     Vitamin B12; Future  Malignant neoplasm of upper-inner quadrant of right breast in female, estrogen receptor positive (HCC) -     CBC with Differential/Platelet; Future -     Comprehensive metabolic panel; Future -     TSH; Future   -Recommend routine eye and dental care. -Healthy lifestyle discussed in detail. -Labs to be updated today. -Prostate cancer screening: N/A Health Maintenance  Topic Date Due   COVID-19 Vaccine (6 - 2023-24 season) 06/11/2023   Mammogram  10/13/2023   Medicare Annual Wellness Visit  08/14/2024   Colon Cancer Screening  05/03/2028   DTaP/Tdap/Td vaccine (3 - Td or Tdap) 08/25/2031   Pneumonia Vaccine  Completed   Flu Shot  Completed   DEXA scan (bone density measurement)  Completed   Hepatitis C Screening  Completed   Zoster (Shingles) Vaccine  Completed   HPV Vaccine  Aged Out      -She will consider updating COVID-vaccine at pharmacy.     Chaya Jan, MD [facility]

## 2023-08-15 NOTE — Progress Notes (Signed)
Medicare Wellness question were answered 08/14/2023.

## 2023-08-22 ENCOUNTER — Ambulatory Visit: Payer: Commercial Managed Care - PPO | Attending: Family | Admitting: Family

## 2023-08-22 ENCOUNTER — Encounter (INDEPENDENT_AMBULATORY_CARE_PROVIDER_SITE_OTHER): Payer: Self-pay | Admitting: Family

## 2023-08-22 ENCOUNTER — Other Ambulatory Visit: Payer: Self-pay

## 2023-08-22 VITALS — BP 132/93 | HR 68 | Temp 96.1°F | Resp 16 | Ht 61.0 in | Wt 138.3 lb

## 2023-08-22 DIAGNOSIS — F17210 Nicotine dependence, cigarettes, uncomplicated: Secondary | ICD-10-CM

## 2023-08-22 DIAGNOSIS — Z951 Presence of aortocoronary bypass graft: Secondary | ICD-10-CM | POA: Insufficient documentation

## 2023-08-22 DIAGNOSIS — I480 Paroxysmal atrial fibrillation: Secondary | ICD-10-CM | POA: Insufficient documentation

## 2023-08-22 DIAGNOSIS — M542 Cervicalgia: Secondary | ICD-10-CM | POA: Insufficient documentation

## 2023-08-22 DIAGNOSIS — I2510 Atherosclerotic heart disease of native coronary artery without angina pectoris: Secondary | ICD-10-CM | POA: Insufficient documentation

## 2023-08-22 DIAGNOSIS — R519 Headache, unspecified: Secondary | ICD-10-CM | POA: Insufficient documentation

## 2023-08-22 DIAGNOSIS — R29898 Other symptoms and signs involving the musculoskeletal system: Secondary | ICD-10-CM | POA: Insufficient documentation

## 2023-08-22 NOTE — Progress Notes (Signed)
Cardiology [facility], [facility]  [address]  [place] [address]  [phone]    Cardiology  Clinic Note    Name: [name]   DOB: October 20, 1955  [67 y.o. female]   MRN: [id number]       Visit Date: 08/22/2023   Referring: No referring provider defined for this encounter.   PCP: Lillia Mountain, PA-C         Chief Complaint: Follow Up 6 Months (Patient states that she has been having some occasional chest pain. She has been having left arm and neck pain also. )      History of Present Illness   [name] is a 67 y.o. White female who presents for follow up. PMH includes HTN, COPD, HLD, and CAD s/p PCI.      LHC by Dr Kirk Ruths in 2019 with PCI to LAD with DES x3 (2.25 x 20, 2.5 x 20, and 2.5 x 15 Promus DES)     She was admitted to [facility] in Feb 2023 for evaluation of cough, body aches, SOB x7 days. Dr Kirk Ruths consulted re: chest pain and dyspnea. During hospitalization, she completed stress test showing no ischemia. Echo showed good EF with normal wall motion.      She was seen in cardiology clinic in April 2023, and reported doing well following discharge. She reported that Dr Griffith Citron stopped her aspirin and Plavix in 2020. We discussed importance of aspirin 81 mg daily, indefinitely.      She was seen in cardiology office in April 2024 and reported the previous weekend she was cleaning her granddaughter's home and developed left arm ache as well as neck and jaw pain that lasted all day.  She denied any recurrence.  She noted associated shortness of breath with that episode.  She reported feeling fatigued.  She also noticed episodes of palpitations that can occur throughout the day and last for several minutes. Stress test, echo, and monitor were ordered.    She presented to [facility] ER on 01/28/2023 for evaluation of elevated blood pressure, headache, epigastric pain.  Troponin was negative.  BNP greater than 300.  Echo during admission showed  good EF, trivial valvular disease.  Stress test was abnormal:  Moderate area of ischemia in the septal segment of medium size, moderate area of ischemia in the apical segment of very small size, moderate area of ischemia in the inferobasal segment of very small size.  Evidence of small infarct noted in the septal, anteroseptal segments.  She then underwent LHC by Dr. Kirk Ruths showing severe in stent restenosis of tandem lesions in the LAD.  Mild circumflex and RCA disease noted.  She was evaluated by CTS, and underwent CABG x1 receiving LIMA to LAD.  She developed postop AFib and was discharged on amiodarone taper.  Her Plavix was discontinued as she had 3 significant nosebleeds.  She required transfusion 1 unit PRBC.  She was not discharged on anticoagulation at discharge.    She initially missed her hospital follow-up, but presents to office today.  She tells me that 1-2 weeks ago she had episode of the left-sided headache, left neck pain, and her left hand was contracted.  She denies any recurrence of symptoms.  She denies chest pain or dyspnea.  She declines ER evaluation today.      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    Chest pain, unspecified 02/06/2023    CAD in native artery 01/24/2022    Essential hypertension 01/24/2022    Hyperlipidemia 01/24/2022    Tobacco use 01/24/2022    History of coronary artery stent placement 01/24/2022       Allergies  No Known Allergies    Medications    Current Outpatient Medications:     anastrozole (ARIMIDEX) 1 mg Oral Tablet, Take 1 Tablet (1 mg total) by mouth Once a day, Disp: , Rfl:     aspirin 81 mg Oral Tablet, Chewable, Chew 1 Tablet (81 mg total) Once a  day, Disp: 30 Tablet, Rfl: 2    atorvastatin (LIPITOR) 40 mg Oral Tablet, Take 1 Tablet (40 mg total) by mouth Once a day, Disp: , Rfl:     cholecalciferol, vitamin D3, 25 mcg (1,000 unit) Oral Tablet, Take 1 Tablet (1,000 Units total) by mouth Once a day, Disp: , Rfl:     clonazePAM (KLONOPIN) 0.5 mg Oral Tablet, Take 1 Tablet (0.5 mg total) by mouth Twice per day as needed for Other (ANXIETY), Disp: , Rfl:     DULoxetine (CYMBALTA DR) 60 mg Oral Capsule, Delayed Release(E.C.), Take 1 Capsule (60 mg total) by mouth Once a day, Disp: , Rfl:     fluticasone propionate (FLONASE) 50 mcg/actuation Nasal Spray, Suspension, 1 Spray Once per day as needed for Other (CONGESTION), Disp: , Rfl:     metoprolol tartrate (LOPRESSOR) 25 mg Oral Tablet, Take 1 Tablet (25 mg total) by mouth Twice daily, Disp: , Rfl:     MV,Ca,Ir,Min-FA-Lut-Lyco-HC153 3-133-33 mg-mcg-mcg Oral Capsule, Take 1 Capsule by mouth Once a day, Disp: , Rfl:     omega-3 fatty acid (LOVAZA) 1 gram Oral Capsule, Take 1 Capsule (1 g total) by mouth Once a day, Disp: , Rfl:     pantoprazole (PROTONIX) 40 mg Oral Tablet, Delayed Release (E.C.), Take 1 Tablet (40 mg total) by mouth Once a day, Disp: , Rfl:     sertraline (ZOLOFT) 100 mg Oral Tablet, Take 1 Tablet (100 mg total) by mouth Once a day, Disp: , Rfl:     traZODone (DESYREL) 150 mg Oral Tablet, Take 1 Tablet (150 mg total) by mouth Every night, Disp: , Rfl:     History  Past Medical History:   Diagnosis Date    Chronic lung disease     Coronary artery disease     Dyslipidemia     Essential hypertension     History of percutaneous coronary intervention     History of stress test     Unspecified chronic bronchitis (CMS HCC)          Past Surgical History:   Procedure Laterality Date    CARDIAC CATHETERIZATION      CORONARY ANGIOPLASTY      CORONARY ARTERY STENT PLACEMENT      HX BACK SURGERY      HX CORONARY ARTERY BYPASS GRAFT      HX MASTECTOMY, SIMPLE Bilateral     HX TUBAL LIGATION      NECK SURGERY        Social History     Socioeconomic History    Marital status: Widowed  Tobacco Use    Smoking status: Every Day     Current packs/day: 0.50     Types: Cigarettes    Smokeless tobacco: Never   Vaping Use    Vaping status: Never Used   Substance and Sexual Activity    Alcohol use: Yes     Comment: occasional    Drug use: Yes     Types: Marijuana     Social Determinants of Health     Social Connections: Low Risk  (02/08/2023)    Social Connections     SDOH Social Isolation: 5 or more times a week     Family Medical History:       Problem Relation (Age of Onset)    Cancer Father    Heart Attack Father    Heart Disease Brother    Pacemaker Mother              Review of Systems:  General: Denies fever or chills. No fatigue.   CNS: Denies syncope or dizziness. No headache.  Eyes: Denies recent visual changes. No pain.  Ears, Nose, and Throat: Denies ear pain, nasal congestion, or sore throat.  Endocrine: Denies excessive hunger or thirst. Denies weight gain or weight loss.   Respiratory: Denies shortness of breath. No cough.  Cardiovascular: Per HPI. Denies claudication or varicose veins.  GI: Denies nausea/vomiting. No black/bloody bowel movements.  GU: Denies hematuria or dysuria. No incontinence.  Musculoskeletal: Denies joint swelling. No decreased ROM.  Skin: No rashes, hives, or eczema.  Psychiatric: Denies anxiety, depression.      All other systems reviewed and either negative or not pertinent.     Physical Examination:  BP (!) 132/93 (Site: Left Arm, Patient Position: Sitting, Cuff Size: Adult)   Pulse 68   Temp (!) 35.6 C (96.1 F) (Temporal)   Resp 16   Ht 1.549 m (5\' 1" )   Wt 62.7 kg (138 lb 4.8 oz)   SpO2 99%   BMI 26.13 kg/m       General: Awake. Well nourished. No acute distress. A&O x 3.  HEENT: Normocephalic, atraumatic. PERRL. Normal hearing.  Neck: No JVD or bruit. Trachea midline.  Lungs: Clear to auscultation. No rhonchi or wheezing noted. Unlabored.  Heart: Regular rate and rhythm. No  murmur, rub, or gallop.  Abdomen: BS normal x 4 quadrants. Soft, non-tender, non-distended.  Extremities: No clubbing or cyanosis. No edema.  Psychiatric: Cooperative, appropriate mood and affect.  Musculoskeletal: Strength grips are equal. No red, swollen joints. MAE.  Skin: Warm, dry, and intact. No rashes or hives are noted.       Diagnostics  ECG:  To be reviewed by Dr Kirk Ruths       Echo:  May 2024  Findings  Left Ventricle:   Normal left ventricular size. Mildly depressed left ventricular systolic function. The left ventricular ejection fraction by visual assessment is estimated to be 45-50%. There is  global hypokinesis of the left ventricle. Left ventricular diastolic parameters are normal.  Right Ventricle:   Normal right ventricular size. Normal right ventricular systolic function. Right ventricular systolic pressure is normal.  Left Atrium:   The left atrium is normal in size.  Right Atrium:   The right atrium is of normal size.  Mitral Valve:   The mitral valve is normal. No evidence of mitral stenosis. There is trace to mild mitral regurgitation.  Tricuspid Valve:   The tricuspid valve is normal. There is trace to mild tricuspid regurgitation. There is  no evidence of tricuspid stenosis.  Aortic Valve:   The aortic valve is normal. No Aortic valve stenosis. There is no evidence of aortic regurgitation.  Pulmonic Valve:   The pulmonic valve is not well visualized. No evidence of pulmonic regurgitation. There is no evidence of pulmonic valve stenosis.  Atrial Septum:   The interatrial septum is normal in appearance.  IVC/Hepatic Veins:   The inferior vena cava was not visualized.  Aorta:   The aortic root is of normal size. The ascending aorta is normal in size.  Pericardium/Pleural space:   No significant pericardial effusion demonstrated.     Electronically signed by: MD Casimer Bilis on 02/10/2023 02:33 PM         Echo:  April 2024   EF 55-60%, normal LV size/function, normal wall motion, grade 1 diastolic  dysfunction, normal RV size/function, trivial mitral regurg, trivial tricuspid regurg, aortic root within normal limits, normal ascending aorta, no pericardial effusion      Stress Test:  April 2024   Abnormal pharmacologic stress nuclear study with evidence of ischemia.  Moderate area of ischemia in the septal segment of medium size.  Moderate area of ischemia in the apical segment of very small size.  Moderate area of ischemia in the inferobasal segment of very small size.  Evidence of small infarct noted in the septal, anteroseptal segments.  Evidence of very small infarct noted in the apical, inferoseptal segments.  Clinically negative stress test with no chest pain.  EF 68%      Catheterization:  LHC by Dr Kirk Ruths in April 2024  PRESSURES  LV:  156/8/18  AO:  137/65/92  Aorto-Ventricular Pressure Difference:  19 mmHg consistent with mild aortic stenosis.    CORONARY ANATOMY  1.  Right coronary:  The right coronary artery is a small caliber vessel giving rise to PDA and posterolateral branches.  The right coronary artery, PDA, and posterolateral branch have 20-30% diffuse disease.  2.  Left main coronary:  The left main bifurcates into the LAD and circumflex systems.  The left main has 0-5% diffuse disease.  3.  Left anterior descending coronary:  The LAD is a moderate size vessel system.  It has tandem mid-LAD in-stent restenosis.  Remainder of the vessel has 10-20% diffuse disease.  4.  Circumflex coronary:  The circumflex gives rise to 3 OMs.  Circumflex proper, OM-1, OM-2, and OM-3 have 10-20% diffuse disease.    CONCLUSION  1.  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  2.  Mild circumflex and right coronary artery disease.  3.  Possible mild aortic stenosis on pullback gradient.  4.  Tobacco addiction/abuse, which was discussed at length the need to stop.  CTS evaluation        Orders Placed This Encounter    Referral to NEUROLOGY - Maisie Fus - [facility]    ECG 12 Lead w/ Interp  (MUSE - In Clinic, Same Day)    14 DAY (HOME ENROLLMENT) EXTENDED HOLTER MONITOR       Medications Discontinued During This Encounter   Medication Reason    amiodarone (PACERONE) 200 mg Oral Tablet Patient states no longer taking       Assessment and Plan:  Assessment/Plan   1. CAD in native artery    2. Headache    3. Neck pain on left side    4. Left hand weakness    5. Paroxysmal atrial fibrillation (CMS HCC)    6. Hx of  CABG        PLAN:  Patient underwent CABG x1 by Dr. Osvaldo Angst in April 2024.  Developed postop AFib, but due to nosebleeds she was not discharged on anticoagulation.  She was discharged on amiodarone taper at discharge.  Her Plavix was stopped during admission due to epistaxis.  She initially missed her hospital follow-up in cardiology office.  She returns to office today and reports that 1-2 weeks ago she had left-sided headache, left neck pain, and her left hand was contracted.  She did not seek medical attention.  She declines ER visit today.  Concern for possible CVA or TIA.  Refer to neurology for further evaluation.  Place monitor to assess heart rate/rhythm.  She denies any chest pain or dyspnea; she states she does not symptoms like she did prior to CABG.  PCP follows lab work including lipid panel.  She tells me she does not have a car, and traveled to follow-ups as hard.  Follow-up in 6 months or sooner if needed.      Follow up:  Return in about 6 months (around 02/19/2024).      The patient was given the opportunity to ask questions and those questions were answered to the patient's satisfaction. The patient was encouraged to call with any additional questions or concerns. Discussed with the effects and side effects of medications. Medication safety was discussed. The patient was informed to contact the office within 7 business days if a message/lab results/referral/imaging results have not been conveyed to the patient.       Jori Moll, APRN,NP-C  [facility]  Cardiology  [facility]    A portion of this documentation may have been generated using [facility] voice recognition software and may contain syntax/voice recognition errors.

## 2023-08-23 ENCOUNTER — Inpatient Hospital Stay (INDEPENDENT_AMBULATORY_CARE_PROVIDER_SITE_OTHER)
Admission: RE | Admit: 2023-08-23 | Discharge: 2023-08-23 | Disposition: A | Discharge status: Home / Self Care | Payer: Commercial Managed Care - PPO | Source: Ambulatory Visit

## 2023-08-23 DIAGNOSIS — I480 Paroxysmal atrial fibrillation: Secondary | ICD-10-CM

## 2023-09-11 ENCOUNTER — Encounter (INDEPENDENT_AMBULATORY_CARE_PROVIDER_SITE_OTHER): Payer: Self-pay | Admitting: Family

## 2023-09-11 NOTE — Progress Notes (Signed)
Received referral back from neurology that patient declined to schedule an appointment.   [name]

## 2023-09-13 LAB — ECG 12 LEAD W/ INTERP (AMB USE ONLY) (MUSE, IN CLINC) (93005/93010)
Atrial Rate: 68 {beats}/min
Calculated P Axis: 83 degrees
Calculated R Axis: 65 degrees
Calculated T Axis: 77 degrees
PR Interval: 156 ms
QRS Duration: 90 ms
QT Interval: 420 ms
QTC Calculation: 446 ms
Ventricular rate: 68 {beats}/min

## 2023-09-14 ENCOUNTER — Other Ambulatory Visit: Payer: Self-pay

## 2023-09-14 ENCOUNTER — Emergency Department
Admission: EM | Admit: 2023-09-14 | Discharge: 2023-09-14 | Disposition: A | Discharge status: Home / Self Care | Payer: Commercial Managed Care - PPO | Source: Home / Self Care | Attending: EMERGENCY MEDICINE | Admitting: EMERGENCY MEDICINE

## 2023-09-14 ENCOUNTER — Emergency Department (HOSPITAL_COMMUNITY): Payer: Commercial Managed Care - PPO

## 2023-09-14 ENCOUNTER — Telehealth: Payer: Self-pay | Admitting: Medical Oncology

## 2023-09-14 DIAGNOSIS — F17210 Nicotine dependence, cigarettes, uncomplicated: Secondary | ICD-10-CM | POA: Insufficient documentation

## 2023-09-14 DIAGNOSIS — J440 Chronic obstructive pulmonary disease with acute lower respiratory infection: Secondary | ICD-10-CM | POA: Insufficient documentation

## 2023-09-14 DIAGNOSIS — J209 Acute bronchitis, unspecified: Secondary | ICD-10-CM | POA: Insufficient documentation

## 2023-09-14 DIAGNOSIS — R059 Cough, unspecified: Secondary | ICD-10-CM

## 2023-09-14 DIAGNOSIS — R0602 Shortness of breath: Secondary | ICD-10-CM

## 2023-09-14 DIAGNOSIS — R079 Chest pain, unspecified: Secondary | ICD-10-CM

## 2023-09-14 DIAGNOSIS — Z1152 Encounter for screening for COVID-19: Secondary | ICD-10-CM | POA: Insufficient documentation

## 2023-09-14 LAB — CBC WITH DIFF
BASOPHIL #: <0.1 10*3/uL (ref <=0.20)
BASOPHIL %: 0.8 %
EOSINOPHIL #: 0.22 10*3/uL (ref <=0.50)
EOSINOPHIL %: 2.7 %
HCT: 43.8 % (ref 34.8–46.0)
HGB: 13.6 g/dL (ref 11.5–16.0)
IMMATURE GRANULOCYTE #: <0.1 10*3/uL (ref <0.10)
IMMATURE GRANULOCYTE %: 0.2 % (ref 0.0–1.0)
LYMPHOCYTE #: 2.18 10*3/uL (ref 1.00–4.80)
LYMPHOCYTE %: 26.3 %
MCH: 28 pg (ref 26.0–32.0)
MCHC: 31.1 g/dL (ref 31.0–35.5)
MCV: 90.1 fL (ref 78.0–100.0)
MONOCYTE #: 0.46 10*3/uL (ref 0.20–1.10)
MONOCYTE %: 5.6 %
MPV: 9.4 fL (ref 8.7–12.5)
NEUTROPHIL #: 5.33 10*3/uL (ref 1.50–7.70)
NEUTROPHIL %: 64.4 %
PLATELETS: 232 10*3/uL (ref 150–400)
RBC: 4.86 10*6/uL (ref 3.85–5.22)
RDW-CV: 14.3 % (ref 11.5–15.5)
WBC: 8.3 10*3/uL (ref 3.7–11.0)

## 2023-09-14 LAB — EXTENDED RESPIRATORY VIRUS PANEL

## 2023-09-14 LAB — TROPONIN-T
TROPONIN-T: 12 ng/L (ref <=14)
TROPONIN-T: 10 ng/L (ref <=14)

## 2023-09-14 LAB — ECG 12 LEAD
Atrial Rate: 88 {beats}/min
Calculated P Axis: 96 degrees
Calculated R Axis: 79 degrees
Calculated T Axis: 72 degrees
PR Interval: 166 ms
QRS Duration: 82 ms
QT Interval: 376 ms
QTC Calculation: 454 ms
Ventricular rate: 88 {beats}/min

## 2023-09-14 LAB — COMPREHENSIVE METABOLIC PANEL, NON-FASTING
ALBUMIN: 4.2 g/dL (ref 3.5–5.2)
ALKALINE PHOSPHATASE: 104 U/L (ref 35–129)
ALT (SGPT): 12 U/L (ref 0–33)
ANION GAP: 10 mmol/L
AST (SGOT): 25 U/L (ref 0–32)
BILIRUBIN TOTAL: 0.2 mg/dL (ref 0.2–1.2)
BUN: 20 mg/dL (ref 8–23)
CALCIUM: 9.3 mg/dL (ref 8.3–10.7)
CHLORIDE: 108 mmol/L — ABNORMAL HIGH (ref 96–106)
CO2 TOTAL: 26 mmol/L (ref 22–30)
CREATININE: 0.76 mg/dL (ref 0.50–0.90)
ESTIMATED GFR: 86 mL/min/{1.73_m2} — ABNORMAL LOW (ref >90)
GLUCOSE: 115 mg/dL — ABNORMAL HIGH (ref 74–109)
POTASSIUM: 4.2 mmol/L (ref 3.2–5.0)
PROTEIN TOTAL: 7 g/dL (ref 6.4–8.3)
SODIUM: 144 mmol/L (ref 133–144)

## 2023-09-14 LAB — NT-PROBNP: NT-PROBNP: 229 pg/mL — ABNORMAL HIGH (ref 0–125)

## 2023-09-14 MED ORDER — PREDNISONE 50 MG TABLET
50.0000 mg | ORAL_TABLET | Freq: Every day | ORAL | 0 refills | Status: AC
Start: 2023-09-14 — End: 2023-09-19
  Filled 2023-09-14 (×2): qty 5, 5d supply, fill #0

## 2023-09-14 MED ORDER — IPRATROPIUM 0.5 MG-ALBUTEROL 3 MG (2.5 MG BASE)/3 ML NEBULIZATION SOLN
3.0000 mL | INHALATION_SOLUTION | RESPIRATORY_TRACT | Status: AC
Start: 2023-09-14 — End: 2023-09-14
  Administered 2023-09-14: 3 mL via RESPIRATORY_TRACT

## 2023-09-14 MED ORDER — METHYLPREDNISOLONE SOD SUCC 125 MG SOLUTION FOR INJECTION WRAPPER
125.0000 mg | INTRAVENOUS | Status: AC
Start: 2023-09-14 — End: 2023-09-14
  Administered 2023-09-14: 125 mg via INTRAMUSCULAR
  Filled 2023-09-14: qty 2

## 2023-09-14 MED ORDER — PREDNISONE 50 MG TABLET
50.0000 mg | ORAL_TABLET | Freq: Every day | ORAL | 0 refills | Status: DC
Start: 2023-09-14 — End: 2023-09-14

## 2023-09-14 NOTE — Telephone Encounter (Signed)
[employer] 2021-05 - Specimen Collection Study to Evaluate Biomarkers in Subjects with Cancer    Outgoing call: 15:08, 5 Year Follow-Up  [facility] with patient regarding study follow-up for year five. Informed patient that a short questionnaire had been sent to her email and asked if she could take a few minutes to complete that. Also asked patient to return call regarding a few questions on cardiovascular events or hospitalizations. Patient thanked, call back number provided.   Rexene Edison, RN, BSN, CCR[employer] Clinical Research Nurse Lead 09/14/2023 3:13 PM

## 2023-09-14 NOTE — ED Triage Notes (Addendum)
Pt reports headache, nausea, dizziness, and chest pain starting yesterday. Pt reports continued symptoms today. Pt reports this is midsternal chest pressure. Pt follows with Dr. Kirk Ruths and has heart hx including 2 stents.   Pt reports neighbors were spraying roach spray 2 days ago and reports getting some in hee mouth which initially caused coughing.   Pt also reports COPD hx

## 2023-09-14 NOTE — ED Provider Notes (Signed)
[facility] [place] - Emergency Department  ED Physician Note      Arrival: Car    Chief Complaint:    Chief Complaint   Patient presents with    Dizziness    Nausea    Chest Pain      [name] is a 67 y.o. female who had concerns including Dizziness, Nausea, and Chest Pain .    History of Present Illness:    Dizziness  Associated symptoms: chest pain, nausea and shortness of breath    Nausea  Associated symptoms include chest pain and shortness of breath.   Chest Pain   Associated symptoms: cough, dizziness, nausea and shortness of breath    -patient presents with several days of cough congestion sputum production.  Some dizziness and some sharp chest pain only when coughing.  No fever.  No chills.  Has a history of chronic obstructive pulmonary disease.  Has been sprain wrote spray over the last days and notes that symptoms seemed to begin when she initiated this    Review of Systems:  Review of Systems   Respiratory:  Positive for cough and shortness of breath.    Cardiovascular:  Positive for chest pain.   Gastrointestinal:  Positive for nausea.   Neurological:  Positive for dizziness.   All other systems reviewed and are negative.        PMH/PSH/FH/SH:    Past Medical History:   Diagnosis Date    Chronic lung disease     Coronary artery disease     Dyslipidemia     Essential hypertension     History of percutaneous coronary intervention     History of stress test     Unspecified chronic bronchitis (CMS HCC)        Past Surgical History:   Procedure Laterality Date    Cardiac catheterization      Coronary angioplasty      Coronary artery stent placement      Hx back surgery      Hx coronary artery bypass graft      Hx mastectomy, simple Bilateral     Hx tubal ligation      Neck surgery         Family History   Problem Relation Age of Onset    Pacemaker Mother     Heart Attack Father     Cancer Father     Heart Disease Brother        Social History     Socioeconomic History    Marital status: Widowed      Spouse name: Not on file    Number of children: Not on file    Years of education: Not on file    Highest education level: Not on file   Occupational History    Not on file   Tobacco Use    Smoking status: Every Day     Current packs/day: 0.50     Types: Cigarettes    Smokeless tobacco: Never   Vaping Use    Vaping status: Never Used   Substance and Sexual Activity    Alcohol use: Yes     Comment: occasional    Drug use: Yes     Types: Marijuana    Sexual activity: Not on file   Other Topics Concern    Not on file   Social History Narrative    Not on file     Social Determinants of Health     Financial  Resource Strain: Not on file   Transportation Needs: Not on file   Social Connections: Low Risk  (02/08/2023)    Social Connections     SDOH Social Isolation: 5 or more times a week   Intimate Partner Violence: Not on file   Housing Stability: Not on file       Meds/Allergies:    Current Outpatient Medications   Medication Instructions    anastrozole (ARIMIDEX) 1 mg, Oral, DAILY    aspirin 81 mg, Oral, DAILY    atorvastatin (LIPITOR) 40 mg, Oral, DAILY    cholecalciferol (vitamin D3) 1,000 Units, Oral, DAILY    clonazePAM (KLONOPIN) 0.5 mg, Oral, 2 TIMES DAILY PRN    DULoxetine (CYMBALTA DR) 60 mg, Oral, DAILY    fluticasone propionate (FLONASE) 50 mcg/actuation Nasal Spray, Suspension 1 Spray, DAILY PRN    metoprolol tartrate (LOPRESSOR) 25 mg, Oral, 2 TIMES DAILY    MV,Ca,Ir,Min-FA-Lut-Lyco-HC153 3-133-33 mg-mcg-mcg Oral Capsule 1 Capsule, Oral, DAILY    omega-3 fatty acid (LOVAZA) 1 gram Oral Capsule 1 Capsule, Oral, DAILY    pantoprazole (PROTONIX) 40 mg, Oral, DAILY    predniSONE (DELTASONE) 50 mg, Oral, DAILY    sertraline (ZOLOFT) 100 mg, Oral, DAILY    traZODone (DESYREL) 150 mg, Oral, NIGHTLY      No Known Allergies    Physical Exam:    ED Triage Vitals [09/14/23 0749]   BP (Non-Invasive) (!) 151/99   Heart Rate 72   Respiratory Rate 18   Temperature 36.8 C (98.2 F)   SpO2 95 %   Weight 62.6 kg (138 lb)    Height 1.549 m (5\' 1" )     Physical Exam  Vitals and nursing note reviewed.   Constitutional:       General: She is not in acute distress.     Appearance: Normal appearance. She is well-developed and normal weight.   HENT:      Head: Normocephalic and atraumatic.      Mouth/Throat:      Mouth: Mucous membranes are moist.      Pharynx: Oropharynx is clear.   Eyes:      Extraocular Movements: Extraocular movements intact.      Conjunctiva/sclera: Conjunctivae normal.      Pupils: Pupils are equal, round, and reactive to light.   Cardiovascular:      Rate and Rhythm: Normal rate and regular rhythm.      Pulses: Normal pulses.      Heart sounds: Normal heart sounds. No murmur heard.  Pulmonary:      Effort: Pulmonary effort is normal. No respiratory distress.      Breath sounds: No stridor. Wheezing present. No rhonchi or rales.   Chest:      Chest wall: No tenderness.   Abdominal:      Palpations: Abdomen is soft.      Tenderness: There is no abdominal tenderness.   Musculoskeletal:         General: No swelling.      Cervical back: Neck supple.   Skin:     General: Skin is warm and dry.      Capillary Refill: Capillary refill takes less than 2 seconds.   Neurological:      General: No focal deficit present.      Mental Status: She is alert and oriented to person, place, and time. Mental status is at baseline.   Psychiatric:         Mood and Affect: Mood normal.  Results:    Labs Ordered/Reviewed   COMPREHENSIVE METABOLIC PANEL, NON-FASTING - Abnormal; Notable for the following components:       Result Value    CHLORIDE 108 (*)     ESTIMATED GFR 86 (*)     GLUCOSE 115 (*)     All other components within normal limits   NT-PROBNP - Abnormal; Notable for the following components:    NT-PROBNP 229 (*)     All other components within normal limits   EXTENDED RESPIRATORY VIRUS PANEL - Normal   TROPONIN-T - Normal    Narrative:     In order to distinguish acute elevations of high sensitive Troponin from other  clinical conditions, the Universal Definition of myocardial infarction stresses clinical assessment and the need for serial measurements to observe a rise and/or fall above the upper limit of the reference interval.    TROPONIN-T - Normal    Narrative:     In order to distinguish acute elevations of high sensitive Troponin from other clinical conditions, the Universal Definition of myocardial infarction stresses clinical assessment and the need for serial measurements to observe a rise and/or fall above the upper limit of the reference interval.    CBC/DIFF    Narrative:     The following orders were created for panel order CBC/DIFF.  Procedure                               Abnormality         Status                     ---------                               -----------         ------                     CBC WITH WGNF[[id number]]                                    Final result                 Please view results for these tests on the individual orders.   CBC WITH DIFF       XR AP MOBILE CHEST   Final Result by Edi, Radresults In (12/05 0846)      1. Postoperative changes. Nothing active.            Radiologist location ID: [id number]             ED Course:    ED Course as of 09/14/23 1216   Thu Sep 14, 2023   1213 CBC/DIFF   1213 COMPREHENSIVE METABOLIC PANEL, NON-FASTING(!)   1213 CHLORIDE(!): 108   1213 ESTIMATED GLOMERULAR FILTRATION RATE(!): 86   1213 GLUCOSE(!): 115   1213 NT-PROBNP(!): 229   1213 EXTENDED RESPIRATORY VIRUS PANEL   1213 TROPONIN-T: 10   1213 XR AP MOBILE CHEST  The patient's laboratory evaluation imaging evaluation is independently reviewed and interpreted.  CBC with a normal limits.  CMP shows a slight decrease in GFR otherwise within normal limits.  BNP negative when adjusted for age.  Viral panel negative.  Chest x-ray shows no acute cardiopulmonary process.  Troponin is  negative.  EKG nonischemic   1214 ECG 12 LEAD  Interpretation Summary    Normal sinus rhythm  Normal ECG  When compared  with ECG of 22-Aug-2023 13:27,  No significant change was found  Result Data    Result Status Result Component Value Units  In process [1] Ventricular rate 88 BPM   Atrial Rate 88 BPM   PR Interval 166 ms   QRS Duration 82 ms   QT Interval 376 ms   QTC Calculation 454 ms   Calculated P Axis 96 degrees   Calculated R Axis 79 degrees   Calculated T Axis 72 degrees             Medications Administered in the ED   ipratropium-albuterol 0.5 mg-3 mg(2.5 mg base)/3 mL Solution for Nebulization (3 mL Nebulization Given 09/14/23 0817)   methylPREDNISolone sod succ (SOLU-medrol) 125 mg/2 mL injection (125 mg IntraMUSCULAR Given 09/14/23 0830)                 Procedures      MDM:       Medical Decision Making  The patient presents with chronic obstructive pulmonary disease exacerbation type symptoms.  Wheezing cough congestion has been going on for several days.  The patient has been spraying some roach infestation treatment around her house.  This may have triggered a bronchitis type of response.  She is improved here after nebulizer treatment.  Steroid.  Workup centered around this complaint.  No evidence of pneumonia.  No evidence of respiratory failure.  Vitals dye studies are negative.  Laboratory evaluation stable including her renal function.  Troponin negative.  Do not suspect cardiopulmonary process.  The patient is afebrile.  No signs of hypoxemia or suspicion of hypercapnia.  She will be discharged home on steroid advised to avoid use of the chemical substance again.  Differential this patient includes acute coronary syndrome, chronic obstructive pulmonary disease, pneumonia, pneumothorax, pleural effusion, sepsis, respiratory failure.      Amount and/or Complexity of Data Reviewed  External Data Reviewed: labs, radiology and ECG.  Labs: ordered. Decision-making details documented in ED Course.  Radiology: ordered and independent interpretation performed. Decision-making details documented in ED Course.  ECG/medicine  tests: ordered and independent interpretation performed. Decision-making details documented in ED Course.    Risk  Prescription drug management.  Risk Details: Chronic obstructive pulmonary disease        Impression:    Clinical Impression   Acute bronchitis with chronic obstructive pulmonary disease (COPD) (CMS HCC)  (CMS HCC) (Primary)           Disposition:   Discharged        Part of this note may have been generated using voice recognition software.  Be advised, it is possible that the generated note may be prone to syntax and other dictation software errors.

## 2023-09-14 NOTE — Discharge Instructions (Addendum)
It is very important you follow up with your family doctor or establish with a primary care physician to discuss all of your results from today's visit.  Discuss with them any new medications prescribed to be sure these do not interact with any your already taking.  Do not drive or operate heavy machinery while taking any pain medication or muscle relaxers.  Please return immediately if your condition worsens, further concerns arise, or if you had trouble getting a follow-up appointment.    PLEASE DISCONTINUE THE USE OF ANY [facility].

## 2023-09-14 NOTE — ED Notes (Signed)

## 2023-09-15 ENCOUNTER — Encounter: Payer: Self-pay | Admitting: Plastic Surgery

## 2023-09-15 ENCOUNTER — Ambulatory Visit: Payer: Medicare HMO | Admitting: Plastic Surgery

## 2023-09-15 VITALS — BP 143/84 | HR 74

## 2023-09-15 DIAGNOSIS — N6489 Other specified disorders of breast: Secondary | ICD-10-CM | POA: Diagnosis not present

## 2023-09-15 DIAGNOSIS — N62 Hypertrophy of breast: Secondary | ICD-10-CM | POA: Diagnosis not present

## 2023-09-15 NOTE — Progress Notes (Signed)
   Subjective:    Patient ID: Judith Thomas, female    DOB: 19-Jun-1956, 67 y.o.   MRN: [id number]  The patient is a 67 year old female here for follow-up after undergoing left breast reduction for improved symmetry.  She had 500 g removed from the left breast.  The right breast had a partial mastectomy with radiation so we did not do any surgery on the right side.  There is no sign of infection no bruising no swelling.  She has healed very nicely.  She had a little fat necrosis at the vertical limb inferiorly on the left side and that is improving and the pain has improved as well.      Review of Systems  Constitutional:  Negative for activity change.  Eyes: Negative.   Respiratory: Negative.    Cardiovascular: Negative.   Gastrointestinal: Negative.   Endocrine: Negative.   Genitourinary: Negative.   Musculoskeletal: Negative.        Objective:   Physical Exam HENT:     Head: Atraumatic.  Cardiovascular:     Rate and Rhythm: Normal rate.     Pulses: Normal pulses.  Skin:    General: Skin is warm.     Capillary Refill: Capillary refill takes less than 2 seconds.     Coloration: Skin is not jaundiced.     Findings: No bruising or lesion.  Neurological:     Mental Status: She is alert and oriented to person, place, and time.  Psychiatric:        Mood and Affect: Mood normal.        Behavior: Behavior normal.        Thought Content: Thought content normal.        Judgment: Judgment normal.       Assessment & Plan:     ICD-10-CM   1. Symptomatic mammary hypertrophy  N62     2. Postoperative breast asymmetry  N64.89        Plan for mammogram early next year.  No changes needed and will plan on 1 year follow-up. Pictures were obtained of the patient and placed in the chart with the patient's or guardian's permission.

## 2023-09-19 ENCOUNTER — Telehealth: Payer: Medicare HMO | Admitting: Internal Medicine

## 2023-09-20 ENCOUNTER — Telehealth: Payer: Self-pay | Admitting: Medical Oncology

## 2023-09-20 NOTE — Telephone Encounter (Signed)
WF [id number] Understanding and Predicting Breast Cancer Events after Treatment (UPBEAT)   Outgoing call: 15:15, Year 5, follow-up  Call to patient to thank her for completing the study sent questionnaire. Inquired with patient if she's had any cardiac issues or hospitalizations in the past year, patient confirmed no cardiac issues or concerns. Patient did have a short hospital stay for surgery, non-cardiac related, in May of 2024.   Patient denies questions at this time and was thanked for her continued support of study and encouraged to call research with questions.   Rexene Edison, RN, BSN, CCRC Clinical Research Nurse Lead 09/20/2023 3:20 PM

## 2023-09-22 ENCOUNTER — Encounter: Payer: Self-pay | Admitting: Hematology and Oncology

## 2023-10-12 DIAGNOSIS — I472 Ventricular tachycardia, unspecified (CMS HCC): Secondary | ICD-10-CM

## 2023-10-12 DIAGNOSIS — I471 Supraventricular tachycardia, unspecified (CMS HCC): Secondary | ICD-10-CM

## 2023-10-12 DIAGNOSIS — I480 Paroxysmal atrial fibrillation: Secondary | ICD-10-CM

## 2023-10-12 LAB — 14 DAY (HOME ENROLLMENT) EXTENDED HOLTER MONITOR
Heart rate (average): 68 {beats}/min
Isolated SVE count: 556 episodes
Isolated VE Counts: 401 episodes
Longest supraventricular tachycardia episode - duration: 5.6 s
Longest supraventricular tachycardia episode - heart rate (: 140 {beats}/min
Longest supraventricular tachycardia episode - number of be: 13 beats
Longest ventricular tachycardia episode - duration: 2.1
Longest ventricular tachycardia episode - heart rate (avera: 132
Longest ventricular tachycardia episode - number of beats: 4 beats
SVE Couplets Counts: 22 episodes
Supraventricular tachycardia - heart rate (average): 132 {beats}/min
Supraventricular tachycardia - number of episodes: 6
Supraventricular tachycardia with fastest heart rate - dura: 2.2 s
Supraventricular tachycardia with fastest heart rate - hear: 161 {beats}/min
Supraventricular tachycardia with fastest heart rate - numb: 5 beats
VE Couplets Counts: 4 episodes
Ventricular tachycardia - heart rate (average): 152 {beats}/min
Ventricular tachycardia - number of episodes: 2
Ventricular tachycardia with fastest heart rate - duration: 1.5 s
Ventricular tachycardia with fastest heart rate - heart rat: 173
Ventricular tachycardia with fastest heart rate - number of: 4 beats

## 2023-10-17 ENCOUNTER — Telehealth (INDEPENDENT_AMBULATORY_CARE_PROVIDER_SITE_OTHER): Payer: Self-pay | Admitting: Family

## 2023-10-17 NOTE — Telephone Encounter (Signed)
Called patient to review monitor results. Left voicemail with callback number.

## 2023-10-17 NOTE — Telephone Encounter (Signed)
Patient returned my call to discuss monitor results. Instructed her to continue lopressor. Follow up as scheduled. She verbalized understanding.         Zio Monitor Results   Patient had a min HR of 42 bpm, max HR of 184 bpm, and avg HR of 68 bpm. Predominant underlying rhythm was Sinus Rhythm. 2 Ventricular Tachycardia runs occurred, the run with the fastest interval lasting 4 beats with a max rate of 184 bpm, the longest lasting 4 beats with an avg rate of 132 bpm. 6 Supraventricular Tachycardia runs occurred, the run with the fastest interval lasting 5 beats with a max rate of 174 bpm, the longest lasting 13 beats with an avg rate of 140 bpm. Isolated SVEs were rare (<1.0%), SVE Couplets were rare (<1.0%), and no SVE Triplets were present. Isolated VEs were rare (<1.0%), VE Couplets were rare (<1.0%), and no VE Triplets were present.      Symptoms correlate with NSR, SVE   No atrial fib or flutter noted  SVT and NSVT asymptomatic

## 2023-10-20 ENCOUNTER — Other Ambulatory Visit (HOSPITAL_BASED_OUTPATIENT_CLINIC_OR_DEPARTMENT_OTHER): Payer: Self-pay

## 2023-10-24 DIAGNOSIS — Z170 Estrogen receptor positive status [ER+]: Secondary | ICD-10-CM | POA: Diagnosis not present

## 2023-10-24 DIAGNOSIS — C50211 Malignant neoplasm of upper-inner quadrant of right female breast: Secondary | ICD-10-CM | POA: Diagnosis not present

## 2023-10-24 NOTE — Progress Notes (Signed)
 PROVIDER:  DEWARD GARNETTE NULL, MD  MRN: [id number] DOB: [birth year] DATE OF ENCOUNTER: 10/24/2023 Subjective   Chief Complaint: Follow-up     History of Present Illness: Judith Thomas is a 68 y.o. female who is seen today for right breast cancer.  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  She was treated with both chemotherapy and radiation therapy.  She now takes anastrozole  and seems to be tolerating it well.  She denies any significant breast pain.  Her next mammogram is scheduled for February.  Since her last visit she did have a reduction on the left back in May and seems to have tolerated it well  Review of Systems: A complete review of systems was obtained from the patient.  I have reviewed this information and discussed as appropriate with the patient.  See HPI as well for other ROS.  ROS    Medical History: Past Medical History:  Diagnosis Date  . Arthritis     Patient Active Problem List  Diagnosis  . Malignant neoplasm of upper-inner quadrant of right breast in female, estrogen receptor positive (CMS/HHS-HCC)    Past Surgical History:  Procedure Laterality Date  . cath placement surgery N/A   . MASTECTOMY PARTIAL / LUMPECTOMY N/A      Allergies  Allergen Reactions  . Paclitaxel  Other (See Comments)    Nausea, flushing    Current Outpatient Medications on File Prior to Visit  Medication Sig Dispense Refill  . anastrozole  (ARIMIDEX ) 1 mg tablet Take 1 tablet by mouth once daily     No current facility-administered medications on file prior to visit.    Family History  Problem Relation Age of Onset  . Stroke Mother   . High blood pressure (Hypertension) Mother   . Hyperlipidemia (Elevated cholesterol) Mother   . Diabetes Father   . Myocardial Infarction (Heart  attack) Father      Social History   Tobacco Use  Smoking Status Never  Smokeless Tobacco Never     Social History   Socioeconomic History  . Marital status: Married  Tobacco Use  . Smoking status: Never  . Smokeless tobacco: Never  Vaping Use  . Vaping status: Never Used  Substance and Sexual Activity  . Alcohol use: Yes  . Drug use: Never   Social Drivers of Corporate Investment Banker Strain: Low Risk  (08/14/2023)   Received from [facility]   Overall Financial Resource Strain (CARDIA)   . Difficulty of Paying Living Expenses: Not very hard  Food Insecurity: No Food Insecurity (08/14/2023)   Received from [facility]   Hunger Vital Sign   . Worried About Programme Researcher, Broadcasting/film/video in the Last Year: Never true   . Ran Out of Food in the Last Year: Never true  Transportation Needs: No Transportation Needs (08/14/2023)   Received from [facility] - Transportation   . Lack of Transportation (Medical): No   . Lack of Transportation (Non-Medical): No  Physical Activity: Insufficiently Active (08/14/2023)   Received from [facility]   Exercise Vital Sign   . Days of Exercise per Week: 4 days   . Minutes of Exercise per Session: 20 min  Stress: No Stress Concern Present (08/14/2023)   Received from [facility]   [place]  [employer] - Occupational Stress Questionnaire   . Feeling of Stress : Not at all  Social Connections: Socially Integrated (08/14/2023)   Received from [facility]   Social Connection and Isolation Panel [NHANES]   . Frequency of Communication with Friends and Family: More than three times a week   . Frequency of Social Gatherings with Friends and Family: More than three times a week   . Attends Religious Services: More than 4 times per year   . Active Member of Clubs or Organizations: Yes   . Attends Banker Meetings: More than 4 times per year   . Marital Status: Married  Housing Stability: Unknown (10/24/2023)    Housing Stability Vital Sign   . Homeless in the Last Year: No    Objective:    Vitals:   10/24/23 1034  PainSc: 0-No pain    There is no height or weight on file to calculate BMI.  Physical Exam Vitals reviewed.  Constitutional:      General: She is not in acute distress.    Appearance: Normal appearance.  HENT:     Head: Normocephalic and atraumatic.     Right Ear: External ear normal.     Left Ear: External ear normal.     Nose: Nose normal.     Mouth/Throat:     Mouth: Mucous membranes are moist.     Pharynx: Oropharynx is clear.  Eyes:     General: No scleral icterus.    Extraocular Movements: Extraocular movements intact.     Conjunctiva/sclera: Conjunctivae normal.     Pupils: Pupils are equal, round, and reactive to light.  Cardiovascular:     Rate and Rhythm: Normal rate and regular rhythm.     Pulses: Normal pulses.     Heart sounds: Normal heart sounds.  Pulmonary:     Effort: Pulmonary effort is normal. No respiratory distress.     Breath sounds: Normal breath sounds.  Abdominal:     General: Bowel sounds are normal.     Palpations: Abdomen is soft.     Tenderness: There is no abdominal tenderness.  Musculoskeletal:        General: No swelling, tenderness or deformity. Normal range of motion.     Cervical back: Normal range of motion and neck supple.  Skin:    General: Skin is warm and dry.     Coloration: Skin is not jaundiced.  Neurological:     General: No focal deficit present.     Mental Status: She is alert and oriented to person, place, and time.  Psychiatric:        Mood and Affect: Mood normal.        Behavior: Behavior normal.   Breast: The right breast is soft and has recovered from radiation well.  There is no palpable mass in either breast.  There is no palpable axillary, supraclavicular, or cervical lymphadenopathy.     Labs, Imaging and Diagnostic Testing:     Assessment and Plan:  Diagnoses and all orders for this  visit:  Malignant neoplasm of upper-inner quadrant of right breast in female, estrogen receptor positive (CMS/HHS-HCC)     The patient is about 4 1/2years status post right breast lumpectomy for breast cancer.  She continues to do well with no clinical evidence of recurrence.  At this point she will continue to do regular self exams.  She will continue to take anastrozole .  I will plan to see her back in  about 1 year.  Her next mammogram will be due in February.  She will see medical oncology in 6 months.  Return in about 1 year (around 10/23/2024).  DEWARD GARNETTE NULL, MD   I had direct face-to-face contact with the patient for a total of 20 minutes and greater than 50% of that time was spent providing counseling and/or coordination of care for the patient regardingRight breast cancer.

## 2023-11-01 ENCOUNTER — Other Ambulatory Visit: Payer: Self-pay

## 2023-11-01 ENCOUNTER — Ambulatory Visit: Payer: Commercial Managed Care - PPO | Attending: INTERVENTIONAL CARDIOLOGY | Admitting: INTERVENTIONAL CARDIOLOGY

## 2023-11-01 ENCOUNTER — Encounter (INDEPENDENT_AMBULATORY_CARE_PROVIDER_SITE_OTHER): Payer: Self-pay | Admitting: INTERVENTIONAL CARDIOLOGY

## 2023-11-01 VITALS — BP 137/86 | HR 79 | Temp 96.1°F | Resp 16 | Ht 61.0 in | Wt 140.0 lb

## 2023-11-01 DIAGNOSIS — M792 Neuralgia and neuritis, unspecified: Secondary | ICD-10-CM | POA: Insufficient documentation

## 2023-11-01 DIAGNOSIS — I2510 Atherosclerotic heart disease of native coronary artery without angina pectoris: Secondary | ICD-10-CM | POA: Insufficient documentation

## 2023-11-01 DIAGNOSIS — E785 Hyperlipidemia, unspecified: Secondary | ICD-10-CM | POA: Insufficient documentation

## 2023-11-01 DIAGNOSIS — C50919 Malignant neoplasm of unspecified site of unspecified female breast: Secondary | ICD-10-CM | POA: Insufficient documentation

## 2023-11-01 DIAGNOSIS — I10 Essential (primary) hypertension: Secondary | ICD-10-CM | POA: Insufficient documentation

## 2023-11-01 DIAGNOSIS — F17210 Nicotine dependence, cigarettes, uncomplicated: Secondary | ICD-10-CM

## 2023-11-01 DIAGNOSIS — I480 Paroxysmal atrial fibrillation: Secondary | ICD-10-CM | POA: Insufficient documentation

## 2023-11-01 MED ORDER — GABAPENTIN 100 MG CAPSULE
100.0000 mg | ORAL_CAPSULE | Freq: Two times a day (BID) | ORAL | 0 refills | Status: AC
Start: 2023-11-01 — End: ?
  Filled 2023-11-02: qty 180, 90d supply, fill #0

## 2023-11-01 NOTE — H&P (Signed)
Cardiology [facility], [facility] [place]  [address]  [place] [address]  [phone]    Cardiology  Clinic Note    Name: [name]   DOB: 1956-01-19  [67 y.o. female]   MRN: [id number]       Visit Date: 11/01/2023   Referring: No referring provider defined for this encounter.   PCP: Rachel Mountain, PA-C         Chief Complaint: Chest Pain (Patient states that she has been having some pain in her chest.)      History of Present Illness   [name] is a 68 y.o. White female known to [place].      LHC by Dr Kirk Ruths in 2019 with PCI to LAD with DES x3 (2.25 x 20, 2.5 x 20, and 2.5 x 15 Promus DES)     She was admitted to [facility] in Feb 2023 for evaluation of cough, body aches, SOB x7 days. Dr Kirk Ruths consulted re: chest pain and dyspnea. During hospitalization, she completed stress test showing no ischemia. Echo showed good EF with normal wall motion.      She was seen in cardiology clinic in April 2023, and reported doing well following discharge. She reported that Dr Griffith Citron stopped her aspirin and Plavix in 2020. We discussed importance of aspirin 81 mg daily, indefinitely.      She presented to [facility] ER on 01/28/2023 for evaluation of elevated blood pressure, headache, epigastric pain.  Troponin was negative.  BNP greater than 300.  Echo during admission showed good EF, trivial valvular disease.  Stress test was abnormal:  Moderate area of ischemia in the septal segment of medium size, moderate area of ischemia in the apical segment of very small size, moderate area of ischemia in the inferobasal segment of very small size.  Evidence of small infarct noted in the septal, anteroseptal segments.  She then underwent LHC by Dr. Kirk Ruths showing severe in stent restenosis of tandem lesions in the LAD.  Mild circumflex and RCA disease noted.  She was evaluated by CTS, and underwent CABG x1 receiving LIMA to LAD.  She developed postop AFib and was discharged on  amiodarone taper.  Her Plavix was discontinued as she had 3 significant nosebleeds.  She required transfusion 1 unit PRBC.  She was not discharged on anticoagulation at discharge.     She tells me that since her surgery she has had ongoing chest pain.  She describes it as pins and needles sticking in her chest.  It never goes away.  Sometimes it gets better and worse but never relieves.  Not worse with exertion or rest just sporadically gets worse.  No nausea, vomiting or diaphoresis.  She states at times it feels like her breasts are "hard" and her chest feels hard.  She tells me she saw her oncology physician 10/13/23 and they told her in terms of her cancer everything was ok.  She had a CXR done that day that showed no acute abnormalities.  She follows with pulmonary care.  She has an appt tomorrow with CT surgery to re evaluate her for this discomfort as well. 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    Chest pain, unspecified 02/06/2023    CAD in native artery 01/24/2022    Essential hypertension 01/24/2022    Hyperlipidemia 01/24/2022    Tobacco use 01/24/2022    History of coronary artery stent placement 01/24/2022       Allergies  No Known Allergies    Medications    Current Outpatient Medications:     anastrozole (ARIMIDEX) 1 mg Oral Tablet, Take 1 Tablet (1 mg total) by mouth Once a day, Disp: , Rfl:     aspirin 81 mg Oral Tablet, Chewable, Chew 1 Tablet (81 mg total) Once a day, Disp: 30 Tablet, Rfl: 2    atorvastatin (LIPITOR) 40 mg  Oral Tablet, Take 1 Tablet (40 mg total) by mouth Once a day, Disp: , Rfl:     cholecalciferol, vitamin D3, 25 mcg (1,000 unit) Oral Tablet, Take 1 Tablet (1,000 Units total) by mouth Once a day, Disp: , Rfl:     clonazePAM (KLONOPIN) 0.5 mg Oral Tablet, Take 1 Tablet (0.5 mg total) by mouth Twice per day as needed for Other (ANXIETY), Disp: , Rfl:     DULoxetine (CYMBALTA DR) 60 mg Oral Capsule, Delayed Release(E.C.), Take 1 Capsule (60 mg total) by mouth Once a day, Disp: , Rfl:     fluticasone propionate (FLONASE) 50 mcg/actuation Nasal Spray, Suspension, 1 Spray Once per day as needed for Other (CONGESTION), Disp: , Rfl:     metoprolol tartrate (LOPRESSOR) 25 mg Oral Tablet, Take 1 Tablet (25 mg total) by mouth Twice daily, Disp: , Rfl:     MV,Ca,Ir,Min-FA-Lut-Lyco-HC153 3-133-33 mg-mcg-mcg Oral Capsule, Take 1 Capsule by mouth Once a day, Disp: , Rfl:     omega-3 fatty acid (LOVAZA) 1 gram Oral Capsule, Take 1 Capsule (1 g total) by mouth Once a day, Disp: , Rfl:     pantoprazole (PROTONIX) 40 mg Oral Tablet, Delayed Release (E.C.), Take 1 Tablet (40 mg total) by mouth Once a day, Disp: , Rfl:     sertraline (ZOLOFT) 100 mg Oral Tablet, Take 1 Tablet (100 mg total) by mouth Once a day, Disp: , Rfl:     traZODone (DESYREL) 150 mg Oral Tablet, Take 1 Tablet (150 mg total) by mouth Every night, Disp: , Rfl:     History  Past Medical History:   Diagnosis Date    Chronic lung disease     Coronary artery disease     Dyslipidemia     Essential hypertension     History of percutaneous coronary intervention     History of stress test     Unspecified chronic bronchitis (CMS HCC)    Right breast cancer      Past Surgical History:   Procedure Laterality Date    CARDIAC CATHETERIZATION      CORONARY ANGIOPLASTY      CORONARY ARTERY STENT PLACEMENT      HX BACK SURGERY      HX CORONARY ARTERY BYPASS GRAFT      HX MASTECTOMY, SIMPLE Bilateral     HX TUBAL LIGATION      NECK SURGERY       Social History  Socioeconomic  History    Marital status: Widowed   Tobacco Use    Smoking status: Every Day     Current packs/day: 0.50     Types: Cigarettes    Smokeless tobacco: Never   Vaping Use    Vaping status: Never Used   Substance and Sexual Activity    Alcohol use: Yes     Comment: occasional    Drug use: Yes     Types: Marijuana     Social Determinants of Health     Social Connections: Low Risk  (02/08/2023)    Social Connections     SDOH Social Isolation: 5 or more times a week     Family Medical History:       Problem Relation (Age of Onset)    Cancer Father    Heart Attack Father    Heart Disease Brother    Pacemaker Mother                    Physical Examination:  BP 137/86 (Site: Left Arm, Patient Position: Sitting, Cuff Size: Adult)   Pulse 79   Temp (!) 35.6 C (96.1 F) (Temporal)   Resp 16   Ht 1.549 m (5\' 1" )   Wt 63.5 kg (140 lb)   SpO2 95%   BMI 26.45 kg/m       General: Awake, Alert and oriented, well nourished, no acute distress appropriate to stated age  Eye: Pupils equal and reactive to light and accommodation  HENT: Normocephalic, no sinus tenderness.  Neck: No carotid bruits, No JVD  Lungs: Clear to auscultation, non-labored respiration.  Heart: Normal rate, regular rhythm, no murmur, gallop or edema.  Abdomen: Normal x 4 quadrants, no guarding, rebound or tenderness.  Musculoskeletal: Normal range of motion, strength and grips are equal.  Skin: No rashes, hives, eczema or psoriasis.  Neurologic: Awake, alert, and oriented X3.  Psychiatric: Cooperative, appropriate mood and affect.        Orders Placed This Encounter    ECG 12 Lead w/ Interp ([facility] - In Clinic, Same Day)       There are no discontinued medications.    Assessment and Plan:  Assessment/Plan   1. PAF (paroxysmal atrial fibrillation) (CMS HCC)        Plan:  Will check echo.  She does not describe any palpitations that would make me suspicious for recurrent atrial fibrillation.  Her discomfort does not sound ischemic in nature.  Patients who have  CABG can experience nerve damage to the anterior intercostal nerves.  This damage can cause sensory abnormalities in the anterior chest wall.  Some patients may also experience nerve damage at the site of the internal thoracic artery harvest.  Up to 81% of patients may experience unpleasant or prolonged pain after surgery, usually subsiding in about 4 months.  However, there is a subset, as high as 15% of patients, who will continue to experience pain as far out as 28 months and beyond.  Medications may provide some relief in this situation and include Neurontin, Lyrica, elavil, topamax and ultram.  If medications are unsuccessful intercostal nerve blocks can be considered.   Discussed with patient and daughter who works in our office.  Will try low dose Neurontin.  I have advised her not to take OTC medications or other prescriptions without checking with pharmacist.  Advised no alcohol or illicit drug use while using this medication.  Will follow her back in office in 3-4 weeks. If  medication is effective, can consider titrating dosing.                Roxan Diesel, APRN,FNP-BC  [facility]  Cardiology  [facility]    A portion of this documentation may have been generated using [facility] voice recognition software and may contain syntax/voice recognition errors.     Physician Addendum:     I have seen, examined and discussed patients history and current issues.      I agree with the charted information and examination by Roxan Diesel APRN-BC.  I have performed my own physical examination which is as follows:   AAO x 3  Heart RRR   Lungs clear  Abd N x 4, negative masses, nontender  Ext no edema  Femoral arteries are +1 bilateral and equal   No gross focal neurovascular deficits noted  DP/PT and  radials +1 bilaterally equal  We will obtain an echocardiogram suspect that her discomfort is coming from internal mammary harvesting we will obtain echocardiogram to wall motion evaluation.  We will  prescribed medication for it neurology associated with the harvesting.  We will follow her in the proximally 3-4 weeks and make adjustments as needed.    More than 50% of the billable services performed this visit were performed by me.  This includes the time spent reviewing chart, my nurse practitioners note and briefing, independent review of patients available labs and imaging, time spent in face to face encounter with patient and discussing test results.      Casimer Bilis, MD

## 2023-11-02 ENCOUNTER — Other Ambulatory Visit: Payer: Self-pay

## 2023-11-02 ENCOUNTER — Ambulatory Visit (HOSPITAL_BASED_OUTPATIENT_CLINIC_OR_DEPARTMENT_OTHER): Payer: Self-pay | Admitting: THORACIC SURGERY CARDIOTHORACIC VASCULAR SURGERY

## 2023-11-02 ENCOUNTER — Encounter (INDEPENDENT_AMBULATORY_CARE_PROVIDER_SITE_OTHER): Payer: Self-pay | Admitting: NURSE PRACTITIONER

## 2023-11-02 DIAGNOSIS — I480 Paroxysmal atrial fibrillation: Secondary | ICD-10-CM

## 2023-11-02 LAB — ECG 12 LEAD W/ INTERP (AMB USE ONLY) (MUSE, IN CLINC) (93005/93010)
Atrial Rate: 79 {beats}/min
Calculated P Axis: 74 degrees
Calculated R Axis: 79 degrees
Calculated T Axis: 79 degrees
PR Interval: 176 ms
QRS Duration: 88 ms
QT Interval: 414 ms
QTC Calculation: 474 ms
Ventricular rate: 79 {beats}/min

## 2023-11-02 NOTE — Nursing Note (Signed)
At [facility]:   Test:  echo  Date/Time:  11-21-23 @ 8:00 am   Authorization:  [id number] 11-02-23 to 01-08-24  I lm for pt and mailed the pt a letter with all the info they need for the testing.

## 2023-11-02 NOTE — Nursing Note (Signed)
Date: 11-02-23  Approved Auth Number: [id number] 11-02-23 to 01-08-24  Person Spoke w: did via [employer] portal   Insurance Contact Number: n/a  Ordering Provider: Roxan Diesel   I advised Hope of this approval.   Test/procedure code: 82956

## 2023-11-09 ENCOUNTER — Ambulatory Visit
Payer: Commercial Managed Care - PPO | Attending: PHYSICIAN ASSISTANT | Admitting: THORACIC SURGERY CARDIOTHORACIC VASCULAR SURGERY

## 2023-11-09 ENCOUNTER — Other Ambulatory Visit: Payer: Self-pay

## 2023-11-09 ENCOUNTER — Encounter (HOSPITAL_BASED_OUTPATIENT_CLINIC_OR_DEPARTMENT_OTHER): Payer: Self-pay | Admitting: THORACIC SURGERY CARDIOTHORACIC VASCULAR SURGERY

## 2023-11-09 VITALS — BP 166/89 | HR 69 | Ht 61.0 in | Wt 140.0 lb

## 2023-11-09 DIAGNOSIS — D225 Melanocytic nevi of trunk: Secondary | ICD-10-CM | POA: Diagnosis not present

## 2023-11-09 DIAGNOSIS — Z1283 Encounter for screening for malignant neoplasm of skin: Secondary | ICD-10-CM | POA: Diagnosis not present

## 2023-11-09 DIAGNOSIS — L918 Other hypertrophic disorders of the skin: Secondary | ICD-10-CM | POA: Diagnosis not present

## 2023-11-09 DIAGNOSIS — F17210 Nicotine dependence, cigarettes, uncomplicated: Secondary | ICD-10-CM

## 2023-11-09 DIAGNOSIS — R0789 Other chest pain: Secondary | ICD-10-CM | POA: Insufficient documentation

## 2023-11-09 DIAGNOSIS — Z951 Presence of aortocoronary bypass graft: Secondary | ICD-10-CM | POA: Insufficient documentation

## 2023-11-13 NOTE — Progress Notes (Signed)
Cardiothoracic Surgery, Division [facility]  [address]  [place] [address]  [phone]      Cardiac Surgery  Return Patient Progress Note    Name: [name]   DOB: [birth year]  [68 y.o. female]   MRN: [id number]       Visit Date: 11/09/2023   Referring: No referring provider defined for this encounter.   PCP: Lillia Mountain, PA-C         Care Team:     Primary Care Providers:  Lillia Mountain, PA-C (General)    Referring Provider:  No ref. provider found    Subjective:     Ms. [name]  is a 68 y.o. Caucasian female who is here for sternal discomfort. She underwent CABG X 1 with LIMA to LAD per Dr. Osvaldo Angst at [facility] on 02/06/23. She reports she has been doing well, however she has some sternal discomfort along her incision. It is described as pins and needles and occurs daily. Incision is well healed and sternum is stable. She has been taking Neurontin that has helped and wanted to make sure this was OK. She had a stable CXR in 12.2024.   Problem List          Cardiovascular System    PAF (paroxysmal atrial fibrillation) (CMS HCC)    S/P CABG x 1    Chest pain    Chest pain, unspecified    CAD in native artery    Essential hypertension    Hyperlipidemia    History of coronary artery stent placement       Respiratory    Tobacco use disorder    Vanishing lung (CMS HCC)    Tobacco use       Endocrine    Prediabetes       Dermatology    Stewart-Treves syndrome (CMS HCC)  (CMS HCC)       Other    Castration complex       Past Medical History:   Diagnosis Date    Chronic lung disease     Coronary artery disease     Dyslipidemia     Essential hypertension     History of percutaneous coronary intervention     History of stress test     Unspecified chronic bronchitis (CMS HCC)           Past Surgical History:   Procedure Laterality Date    CARDIAC CATHETERIZATION      CORONARY ANGIOPLASTY      CORONARY ARTERY STENT PLACEMENT      HX BACK SURGERY      HX CORONARY ARTERY BYPASS GRAFT       HX MASTECTOMY, SIMPLE Bilateral     HX TUBAL LIGATION      NECK SURGERY            anastrozole (ARIMIDEX) 1 mg Oral Tablet, Take 1 Tablet (1 mg total) by mouth Once a day  aspirin 81 mg Oral Tablet, Chewable, Chew 1 Tablet (81 mg total) Once a day  atorvastatin (LIPITOR) 40 mg Oral Tablet, Take 1 Tablet (40 mg total) by mouth Once a day  cholecalciferol, vitamin D3, 25 mcg (1,000 unit) Oral Tablet, Take 1 Tablet (1,000 Units total) by mouth Once a day  clonazePAM (KLONOPIN) 0.5 mg Oral Tablet, Take 1 Tablet (0.5 mg total) by mouth Twice per day as needed for Other (ANXIETY)  DULoxetine (CYMBALTA DR) 60 mg Oral Capsule, Delayed Release(E.C.), Take 1 Capsule (60 mg total) by  mouth Once a day  fluticasone propionate (FLONASE) 50 mcg/actuation Nasal Spray, Suspension, 1 Spray Once per day as needed for Other (CONGESTION)  gabapentin (NEURONTIN) 100 mg Oral Capsule, take 1 capsule twice daily  metoprolol tartrate (LOPRESSOR) 25 mg Oral Tablet, Take 1 Tablet (25 mg total) by mouth Twice daily  MV,Ca,Ir,Min-FA-Lut-Lyco-HC153 3-133-33 mg-mcg-mcg Oral Capsule, Take 1 Capsule by mouth Once a day  omega-3 fatty acid (LOVAZA) 1 gram Oral Capsule, Take 1 Capsule (1 g total) by mouth Once a day (Patient not taking: Reported on 11/09/2023)  pantoprazole (PROTONIX) 40 mg Oral Tablet, Delayed Release (E.C.), Take 1 Tablet (40 mg total) by mouth Once a day (Patient not taking: Reported on 11/09/2023)  sertraline (ZOLOFT) 100 mg Oral Tablet, Take 1 Tablet (100 mg total) by mouth Once a day (Patient not taking: Reported on 11/09/2023)  traZODone (DESYREL) 150 mg Oral Tablet, Take 1 Tablet (150 mg total) by mouth Every night    No facility-administered medications prior to visit.    No Known Allergies   Social History     Tobacco Use    Smoking status: Every Day     Current packs/day: 0.50     Types: Cigarettes    Smokeless tobacco: Never   Substance Use Topics    Alcohol use: Yes     Comment: occasional      Family Medical History:        Problem Relation (Age of Onset)    Cancer Father    Heart Attack Father    Heart Disease Brother    Pacemaker Mother             Review of Systems  Other than ROS in the HPI, all other systems were negative.    Objective:     BP (!) 166/89 (Site: Left Arm, Patient Position: Sitting, Cuff Size: Adult)   Pulse 69   Ht 1.549 m (5\' 1" )   Wt 63.5 kg (140 lb)   SpO2 97%   BMI 26.45 kg/m       Physical Exam:  General appearance: Appears healthy.  Alert; in no acute distress.  Pleasant.  Cardiac: negative RRR   Sternum stable   Respiratory: Respiratory effort normal  Abdomen: soft, non-tender. Bowel sounds normal. No masses,  no organomegaly  Extremities: no cyanosis, clubbing or edema present    Surgical Incisions:   Chest: well healing, without drainage      Assessment:     Problem List Items Addressed This Visit       S/P CABG x 1     Other Visit Diagnoses       Sternal pain    -  Primary            68 year old female with history of CABG with sternal pain    Plan:     -OK to continue with Neurontin  -She follows with cardiology and has had a recent ECHO that is pending  -She will RTO here as needed in the future    Karma Ganja, FNP-BC 11/13/2023, 10:51

## 2023-11-21 ENCOUNTER — Ambulatory Visit (HOSPITAL_COMMUNITY): Payer: Self-pay

## 2023-11-23 ENCOUNTER — Telehealth (HOSPITAL_BASED_OUTPATIENT_CLINIC_OR_DEPARTMENT_OTHER): Payer: Self-pay | Admitting: THORACIC SURGERY CARDIOTHORACIC VASCULAR SURGERY

## 2023-11-23 NOTE — Telephone Encounter (Signed)
Pt's daughter Velna Hatchet called to see if we was still planning on scheduling a CT scan for her mother. They have not heard anything from our office. She was seen 11/09/23.     Velna Hatchet [phone]

## 2023-11-24 DIAGNOSIS — Z1231 Encounter for screening mammogram for malignant neoplasm of breast: Secondary | ICD-10-CM | POA: Diagnosis not present

## 2023-11-24 DIAGNOSIS — N958 Other specified menopausal and perimenopausal disorders: Secondary | ICD-10-CM | POA: Diagnosis not present

## 2023-11-24 DIAGNOSIS — E2839 Other primary ovarian failure: Secondary | ICD-10-CM | POA: Diagnosis not present

## 2023-11-24 DIAGNOSIS — M8588 Other specified disorders of bone density and structure, other site: Secondary | ICD-10-CM | POA: Diagnosis not present

## 2023-11-24 LAB — HM DEXA SCAN

## 2023-11-24 LAB — HM MAMMOGRAPHY

## 2023-11-27 ENCOUNTER — Encounter: Payer: Self-pay | Admitting: Internal Medicine

## 2023-12-11 ENCOUNTER — Telehealth (INDEPENDENT_AMBULATORY_CARE_PROVIDER_SITE_OTHER): Payer: Self-pay | Admitting: INTERVENTIONAL CARDIOLOGY

## 2023-12-11 ENCOUNTER — Ambulatory Visit (HOSPITAL_COMMUNITY): Payer: Self-pay

## 2023-12-11 NOTE — Telephone Encounter (Signed)
 Patient called and states since her CABG 4/24 she has had ongoing chest pain that has gradually worsened.  She describes it as pins and needles sticking in her chest. It never goes away. Sometimes it gets better and worse but never relieves. Not worse with exertion or rest just sporadically gets worse. No nausea, vomiting or diaphoresis. Denies any SOB or skipped  beats.  She states at times it feels like her breasts are "hard" and her chest feels hard. She states that it is from across the upper part of her chest down to wear bra line would be under breasts and is a burning, aching and sometimes tightness that has gotten gradually worse.  She also states she feels some burning and tightness in her upper thighs and panty line area for last couple weeks. She saw Dr. Osvaldo Angst about 3 weeks ago or so and that he was going to do some type of test to look at the metal in her chest but she has not heard anything and that the 400mg  Neurontin she takes TID does not help the pain. States she cried all last week because pain would not go away or ease up much.    Advised will talk to NP and call her back with recommendations.  She verbalized her understanding and agreed with POC.

## 2023-12-12 ENCOUNTER — Other Ambulatory Visit: Payer: Self-pay

## 2023-12-12 ENCOUNTER — Ambulatory Visit: Payer: Self-pay | Attending: Family | Admitting: Family

## 2023-12-12 ENCOUNTER — Encounter (INDEPENDENT_AMBULATORY_CARE_PROVIDER_SITE_OTHER): Payer: Self-pay | Admitting: Family

## 2023-12-12 VITALS — BP 147/121 | HR 73 | Temp 98.6°F | Resp 16 | Ht 61.0 in | Wt 139.0 lb

## 2023-12-12 DIAGNOSIS — Z951 Presence of aortocoronary bypass graft: Secondary | ICD-10-CM | POA: Insufficient documentation

## 2023-12-12 DIAGNOSIS — Z79899 Other long term (current) drug therapy: Secondary | ICD-10-CM | POA: Insufficient documentation

## 2023-12-12 DIAGNOSIS — I480 Paroxysmal atrial fibrillation: Secondary | ICD-10-CM | POA: Insufficient documentation

## 2023-12-12 DIAGNOSIS — I2510 Atherosclerotic heart disease of native coronary artery without angina pectoris: Secondary | ICD-10-CM | POA: Insufficient documentation

## 2023-12-12 DIAGNOSIS — G548 Other nerve root and plexus disorders: Secondary | ICD-10-CM | POA: Insufficient documentation

## 2023-12-12 DIAGNOSIS — Z720 Tobacco use: Secondary | ICD-10-CM | POA: Insufficient documentation

## 2023-12-12 MED ORDER — LOSARTAN 25 MG TABLET
25.0000 mg | ORAL_TABLET | Freq: Every day | ORAL | 1 refills | Status: DC
Start: 2023-12-12 — End: 2024-04-10

## 2023-12-12 NOTE — Progress Notes (Signed)
 Cardiology [facility], [facility] [place]  [address]  [place] [address]  [phone]    Cardiology  Clinic Note    Name: [name]   DOB: Feb 23, 1956  [68 y.o. female]   MRN: [id number]       Visit Date: 12/12/2023   Referring: No referring provider defined for this encounter.   PCP: No Pcp         Chief Complaint: Chest Pain  (Patient complains of chest pains, and shortness of breath )      History of Present Illness   Dyanara Cozza is a 68 y.o. White female who presents for work-in visit. PMH includes HTN, COPD, HLD, and CAD s/p PCI.      LHC by Dr Kirk Ruths in 2019 with PCI to LAD with DES x3 (2.25 x 20, 2.5 x 20, and 2.5 x 15 Promus DES)     She was admitted to [facility] in Feb 2023 for evaluation of cough, body aches, SOB x7 days. Dr Kirk Ruths consulted re: chest pain and dyspnea. During hospitalization, she completed stress test showing no ischemia. Echo showed good EF with normal wall motion.      She was seen in cardiology clinic in April 2023, and reported doing well following discharge. She reported that Dr Griffith Citron stopped her aspirin and Plavix in 2020. We discussed importance of aspirin 81 mg daily, indefinitely.      She was seen in cardiology office in April 2024 and reported the previous weekend she was cleaning her granddaughter's home and developed left arm ache as well as neck and jaw pain that lasted all day.  She denied any recurrence.  She noted associated shortness of breath with that episode.  She reported feeling fatigued.  She also noticed episodes of palpitations that can occur throughout the day and last for several minutes. Stress test, echo, and monitor were ordered.     She presented to [facility] on 01/28/2023 for evaluation of elevated blood pressure, headache, epigastric pain.  Troponin was negative.  BNP greater than 300.  Echo during admission showed good EF, trivial valvular disease.  Stress test was abnormal:  Moderate area of  ischemia in the septal segment of medium size, moderate area of ischemia in the apical segment of very small size, moderate area of ischemia in the inferobasal segment of very small size.  Evidence of small infarct noted in the septal, anteroseptal segments.  She then underwent LHC by Dr. Kirk Ruths showing severe in stent restenosis of tandem lesions in the LAD.  Mild circumflex and RCA disease noted.  She was evaluated by CTS, and underwent CABG x1 receiving LIMA to LAD.  She developed postop AFib and was discharged on amiodarone taper.  Her Plavix was discontinued as she had 3 significant nosebleeds.  She required transfusion 1 unit PRBC.  She was not discharged on anticoagulation at discharge.     She was seen in cardiology office in Nov 2024 and reported 1-2 weeks prior she had episode of the left-sided headache, left neck pain, and her left hand was contracted.  She denied any recurrence of symptoms.  She denied chest pain or dyspnea.  She declined ER evaluation. Monitor was ordered. She was referred to neurology.      Zio monitor results were called to patient in January 2025 and showed predominantly sinus rhythm.  Two runs V-tach occurred.  Six runs SVT occurred.  Symptoms correlated with NSR, SVE.  No atrial fib or flutter noted.  SVT and NSVT asymptomatic.    She was seen in cardiology office by Dr. Kirk Ruths in January 2025 and reported ongoing chest pain described as pins and needles that never went away.  She stated sometimes her breasts felt hard in her chest felt hard.  Continued to follow with pulmonary care.  Concern for nerve damage to the anterior intercostal nerves.  She was started on low-dose Neurontin.    She was seen in CTS/Dr. Osvaldo Angst in January 2025, who recommended okay to continue with Neurontin.    Patient contacted the office on 12/11/2022 and reported ongoing chest pain that had progressively worsened.  She described it as pins and needles sticking in her chest that never go away.  Sometimes it gets  better and worse, but never totally relieved.  Symptoms not associated with exertion or rest.  She was scheduled for work-in visit.    She presents to office today, accompanied by her daughter.  She reports her chest is "always heavy".  She states that her chest wall is tender to touch on that sometimes her breasts and chest "feel hard".  She did not notice much improvement in her symptoms with the Neurontin prescription.  She says there is a burning sensation across her chest.  She states that the pain was so bad last week she cried for several days.  She reported that she attempted suicide several years ago and stated, "this pain makes you think about things".  She denies any suicidal ideation at this time.  Denies any plans to harm to herself or others.      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    Chest pain, unspecified 02/06/2023    CAD in native artery 01/24/2022    Essential hypertension 01/24/2022    Hyperlipidemia 01/24/2022    Tobacco use 01/24/2022    History of coronary artery stent placement 01/24/2022       Allergies  No Known Allergies    Medications    Current Outpatient Medications:     anastrozole (ARIMIDEX) 1 mg Oral Tablet, Take 1 Tablet (1 mg total) by mouth Once a day, Disp: , Rfl:     aspirin 81 mg Oral Tablet, Chewable, Chew 1 Tablet (81 mg total) Once a day, Disp: 30 Tablet, Rfl: 2    atorvastatin (LIPITOR) 40 mg Oral Tablet, Take 1 Tablet (40 mg total) by mouth Once a day, Disp: , Rfl:     cholecalciferol, vitamin D3, 25 mcg (1,000 unit) Oral Tablet, Take 1 Tablet (1,000 Units total) by mouth Once a day, Disp: , Rfl:     clonazePAM (KLONOPIN) 0.5 mg Oral Tablet, Take 1 Tablet  (0.5 mg total) by mouth Twice per day as needed for Other (ANXIETY), Disp: , Rfl:     DULoxetine (CYMBALTA DR) 60 mg Oral Capsule, Delayed Release(E.C.), Take 1 Capsule (60 mg total) by mouth Once a day, Disp: , Rfl:     fluticasone propionate (FLONASE) 50 mcg/actuation Nasal Spray, Suspension, 1 Spray Once per day as needed for Other (CONGESTION), Disp: , Rfl:     losartan (  COZAAR) 25 mg Oral Tablet, Take 1 Tablet (25 mg total) by mouth Once a day, Disp: 90 Tablet, Rfl: 1    metoprolol tartrate (LOPRESSOR) 25 mg Oral Tablet, Take 1 Tablet (25 mg total) by mouth Twice daily, Disp: , Rfl:     MV,Ca,Ir,Min-FA-Lut-Lyco-HC153 3-133-33 mg-mcg-mcg Oral Capsule, Take 1 Capsule by mouth Once a day, Disp: , Rfl:     omega-3 fatty acid (LOVAZA) 1 gram Oral Capsule, Take 1 Capsule (1 g total) by mouth Once a day, Disp: , Rfl:     pantoprazole (PROTONIX) 40 mg Oral Tablet, Delayed Release (E.C.), Take 1 Tablet (40 mg total) by mouth Once a day, Disp: , Rfl:     sertraline (ZOLOFT) 100 mg Oral Tablet, Take 1 Tablet (100 mg total) by mouth Once a day, Disp: , Rfl:     traZODone (DESYREL) 150 mg Oral Tablet, Take 1 Tablet (150 mg total) by mouth Every night, Disp: , Rfl:       History  Past Medical History:   Diagnosis Date    Chronic lung disease     Coronary artery disease     Dyslipidemia     Essential hypertension     History of percutaneous coronary intervention     History of stress test     Unspecified chronic bronchitis (CMS HCC)            Past Surgical History:   Procedure Laterality Date    CARDIAC CATHETERIZATION      CORONARY ANGIOPLASTY      CORONARY ARTERY STENT PLACEMENT      HX BACK SURGERY      HX CORONARY ARTERY BYPASS GRAFT      HX MASTECTOMY, SIMPLE Bilateral     HX TUBAL LIGATION      NECK SURGERY         Social History     Socioeconomic History    Marital status: Widowed   Tobacco Use    Smoking status: Every Day     Current packs/day: 0.50     Types: Cigarettes    Smokeless tobacco: Never   Vaping Use     Vaping status: Never Used   Substance and Sexual Activity    Alcohol use: Yes     Comment: occasional    Drug use: Yes     Types: Marijuana     Social Determinants of Health     Social Connections: Low Risk  (02/08/2023)    Social Connections     SDOH Social Isolation: 5 or more times a week       Family Medical History:       Problem Relation (Age of Onset)    Cancer Father    Heart Attack Father    Heart Disease Brother    Pacemaker Mother              Review of Systems:  General: Denies fever or chills. No fatigue.   CNS: Denies syncope or dizziness. No headache.  Eyes: Denies recent visual changes. No pain.  Ears, Nose, and Throat: Denies ear pain, nasal congestion, or sore throat.  Endocrine: Denies excessive hunger or thirst. Denies weight gain or weight loss.   Respiratory: Denies shortness of breath. No cough.  Cardiovascular: Per HPI. Denies claudication or varicose veins.  GI: Denies nausea/vomiting. No black/bloody bowel movements.  GU: Denies hematuria or dysuria. No incontinence.  Musculoskeletal: Denies joint swelling. No decreased ROM.  Skin: No rashes, hives, or eczema.  Psychiatric:  Denies anxiety, depression.      All other systems reviewed and either negative or not pertinent.     Physical Examination:  BP (!) 147/121 (Site: Left Arm, Patient Position: Sitting, Cuff Size: Adult)   Pulse 73   Temp 37 C (98.6 F) (Temporal)   Resp 16   Ht 1.549 m (5\' 1" )   Wt 63 kg (139 lb)   SpO2 97%   BMI 26.26 kg/m       General: Awake. Well nourished. No acute distress. A&O x 3.  HEENT: Normocephalic, atraumatic. PERRL. Normal hearing.  Neck: No JVD or bruit. Trachea midline.  Lungs: Clear to auscultation. No rhonchi or wheezing noted. Unlabored.  Heart: Regular rate and rhythm. No murmur, rub, or gallop.  Abdomen: BS normal x 4 quadrants. Soft, non-tender, non-distended.  Extremities: No clubbing or cyanosis. No edema.  Psychiatric: Cooperative, appropriate mood and affect.  Musculoskeletal: Strength  grips are equal. No red, swollen joints. MAE.  Skin: Warm, dry, and intact. No rashes or hives are noted.       Diagnostics  ECG:  To be reviewed by Dr Kirk Ruths       Echo:  May 2024  Findings  Left Ventricle:   Normal left ventricular size. Mildly depressed left ventricular systolic function. The left ventricular ejection fraction by visual assessment is estimated to be 45-50%. There is  global hypokinesis of the left ventricle. Left ventricular diastolic parameters are normal.  Right Ventricle:   Normal right ventricular size. Normal right ventricular systolic function. Right ventricular systolic pressure is normal.  Left Atrium:   The left atrium is normal in size.  Right Atrium:   The right atrium is of normal size.  Mitral Valve:   The mitral valve is normal. No evidence of mitral stenosis. There is trace to mild mitral regurgitation.  Tricuspid Valve:   The tricuspid valve is normal. There is trace to mild tricuspid regurgitation. There is no evidence of tricuspid stenosis.  Aortic Valve:   The aortic valve is normal. No Aortic valve stenosis. There is no evidence of aortic regurgitation.  Pulmonic Valve:   The pulmonic valve is not well visualized. No evidence of pulmonic regurgitation. There is no evidence of pulmonic valve stenosis.  Atrial Septum:   The interatrial septum is normal in appearance.  IVC/Hepatic Veins:   The inferior vena cava was not visualized.  Aorta:   The aortic root is of normal size. The ascending aorta is normal in size.  Pericardium/Pleural space:   No significant pericardial effusion demonstrated.     Electronically signed by: MD Casimer Bilis on 02/10/2023 02:33 PM           Echo:  April 2024   EF 55-60%, normal LV size/function, normal wall motion, grade 1 diastolic dysfunction, normal RV size/function, trivial mitral regurg, trivial tricuspid regurg, aortic root within normal limits, normal ascending aorta, no pericardial effusion        Stress Test:  April 2024   Abnormal pharmacologic  stress nuclear study with evidence of ischemia.  Moderate area of ischemia in the septal segment of medium size.  Moderate area of ischemia in the apical segment of very small size.  Moderate area of ischemia in the inferobasal segment of very small size.  Evidence of small infarct noted in the septal, anteroseptal segments.  Evidence of very small infarct noted in the apical, inferoseptal segments.  Clinically negative stress test with no chest pain.  EF 68%  Catheterization:  LHC by Dr Kirk Ruths in April 2024  PRESSURES  LV:  156/8/18  AO:  137/65/92  Aorto-Ventricular Pressure Difference:  19 mmHg consistent with mild aortic stenosis.     CORONARY ANATOMY  1.  Right coronary:  The right coronary artery is a small caliber vessel giving rise to PDA and posterolateral branches.  The right coronary artery, PDA, and posterolateral branch have 20-30% diffuse disease.  2.  Left main coronary:  The left main bifurcates into the LAD and circumflex systems.  The left main has 0-5% diffuse disease.  3.  Left anterior descending coronary:  The LAD is a moderate size vessel system.  It has tandem mid-LAD in-stent restenosis.  Remainder of the vessel has 10-20% diffuse disease.  4.  Circumflex coronary:  The circumflex gives rise to 3 OMs.  Circumflex proper, OM-1, OM-2, and OM-3 have 10-20% diffuse disease.     CONCLUSION  1.  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  2.  Mild circumflex and right coronary artery disease.  3.  Possible mild aortic stenosis on pullback gradient.  4.  Tobacco addiction/abuse, which was discussed at length the need to stop.  CTS evaluation           Orders Placed This Encounter    BASIC METABOLIC PANEL    Referral to External Provider (AMB)    ECG 12 Lead w/ Interp ([facility] - In Clinic, Same Day)    losartan (COZAAR) 25 mg Oral Tablet       Medications Discontinued During This Encounter   Medication Reason    gabapentin (NEURONTIN) 100 mg Oral Capsule Patient states no longer  taking       Assessment and Plan:  Assessment/Plan   1. PAF (paroxysmal atrial fibrillation) (CMS HCC)    2. Medication management    3. Intercostal neuropathic pain    4. CAD in native artery    5. Hx of CABG    6. Tobacco use        PLAN:  Patient presents to office for work-in visit.  She reports continued burning pain across her chest post CABG.  She was initially treated with Neurontin with minimal improvement in her symptoms.  She reports continued tenderness to touch of chest wall.  She states that her bilateral breasts and chest often feel hard.    Concerned symptoms may be intercostal nerve damage from takedown of IMA with CABG which can happen in a substantial number of patients with a minority of these patients having bothersome chronic chest wall pain.  Referral to pain clinic for consideration of nerve block.    Blood pressure elevated during triage.  Manual repeat blood pressure 148/90.  Add losartan 25 mg daily for better blood pressure control.  She has not yet established with new PCP.  She was given lab order for BMP in 1-2 weeks.  Encouraged her to check blood pressure at home and keep BP log.  Follow-up in 3 months or sooner if needed.  History suicide attempt several years ago and she stated in office, "this pain makes you think about things".  She denies any suicidal ideation at this time.  Denies any plans to harm to herself or others.  I have reached out to CTS NP regarding plans for CT chest ordered by their office.  She will follow-up and reach out to patient.      Follow up:  Return in about 3 months (around 03/13/2024).  The patient was given the opportunity to ask questions and those questions were answered to the patient's satisfaction. The patient was encouraged to call with any additional questions or concerns. Discussed with the effects and side effects of medications. Medication safety was discussed. The patient was informed to contact the office within 7 business days if a  message/lab results/referral/imaging results have not been conveyed to the patient.       Jori Moll, APRN,NP-C  [facility]  Cardiology  [facility] Medicine    A portion of this documentation may have been generated using [employer] voice recognition software and may contain syntax/voice recognition errors.

## 2023-12-15 ENCOUNTER — Telehealth (HOSPITAL_COMMUNITY): Payer: Self-pay | Admitting: Nurse Practitioner

## 2023-12-15 ENCOUNTER — Other Ambulatory Visit (HOSPITAL_COMMUNITY): Payer: Self-pay | Admitting: Nurse Practitioner

## 2023-12-15 DIAGNOSIS — R0789 Other chest pain: Secondary | ICD-10-CM

## 2023-12-15 NOTE — Telephone Encounter (Signed)
 Patient called regarding her persistent sternal pain. She reports significant pain on a daily basis that sometimes is tolerable and sometimes brings her to tears. Her pain is worse with exertion and feels like pins and needs sticking her. Neurontin is no longer helping. She has been referred to the pain clinic per cardiology. We will get a CT Chest without contrast and have her RTO. She verbalized understanding.

## 2023-12-18 ENCOUNTER — Telehealth (HOSPITAL_BASED_OUTPATIENT_CLINIC_OR_DEPARTMENT_OTHER): Payer: Self-pay | Admitting: Nurse Practitioner

## 2023-12-18 NOTE — Telephone Encounter (Signed)
-----   Message from Karma Ganja, FNP-BC sent at 12/15/2023 12:09 PM EST -----  Please get patient a CT Chest without contrast ASAP and RTO to see Dr. Osvaldo Angst following CT. IF she can get CT in the morning and see Dans after would be great

## 2023-12-18 NOTE — Telephone Encounter (Signed)
 CT Chest and follow up with Dr.Dans scheduled. Patient notified of appointments date/time. Appointment reminders mailed.

## 2023-12-28 ENCOUNTER — Ambulatory Visit: Payer: Self-pay | Admitting: THORACIC SURGERY CARDIOTHORACIC VASCULAR SURGERY

## 2023-12-28 ENCOUNTER — Encounter (HOSPITAL_BASED_OUTPATIENT_CLINIC_OR_DEPARTMENT_OTHER): Payer: Self-pay | Admitting: THORACIC SURGERY CARDIOTHORACIC VASCULAR SURGERY

## 2023-12-28 ENCOUNTER — Other Ambulatory Visit: Payer: Self-pay

## 2023-12-28 ENCOUNTER — Ambulatory Visit (HOSPITAL_COMMUNITY): Payer: Self-pay

## 2023-12-28 ENCOUNTER — Inpatient Hospital Stay (HOSPITAL_BASED_OUTPATIENT_CLINIC_OR_DEPARTMENT_OTHER)
Admission: RE | Admit: 2023-12-28 | Discharge: 2023-12-28 | Disposition: A | Discharge status: Home / Self Care | Payer: Self-pay | Source: Ambulatory Visit | Attending: Nurse Practitioner | Admitting: Nurse Practitioner

## 2023-12-28 VITALS — BP 137/81 | HR 72 | Ht 61.0 in | Wt 139.0 lb

## 2023-12-28 DIAGNOSIS — I480 Paroxysmal atrial fibrillation: Secondary | ICD-10-CM | POA: Insufficient documentation

## 2023-12-28 DIAGNOSIS — R0789 Other chest pain: Secondary | ICD-10-CM | POA: Insufficient documentation

## 2023-12-28 DIAGNOSIS — E785 Hyperlipidemia, unspecified: Secondary | ICD-10-CM | POA: Insufficient documentation

## 2023-12-28 DIAGNOSIS — I2510 Atherosclerotic heart disease of native coronary artery without angina pectoris: Secondary | ICD-10-CM | POA: Insufficient documentation

## 2023-12-28 DIAGNOSIS — G548 Other nerve root and plexus disorders: Secondary | ICD-10-CM | POA: Insufficient documentation

## 2023-12-28 DIAGNOSIS — I10 Essential (primary) hypertension: Secondary | ICD-10-CM | POA: Insufficient documentation

## 2023-12-28 DIAGNOSIS — Z87891 Personal history of nicotine dependence: Secondary | ICD-10-CM

## 2023-12-28 DIAGNOSIS — Z951 Presence of aortocoronary bypass graft: Secondary | ICD-10-CM | POA: Insufficient documentation

## 2023-12-28 DIAGNOSIS — J439 Emphysema, unspecified: Secondary | ICD-10-CM

## 2023-12-28 NOTE — Progress Notes (Signed)
 CARDI[facility]ORACIC SURGERY, [facility]  [address]  [place] [address]  Operated by [facility]  Cardiothoracic Surgery  Clinic Note    Name: [name] MRN:  [id number]   Date: 12/28/2023 DOB:  1956/04/11 (68 y.o.)       Chief Complaint: Follow Up (F/u on sternal pain )      History of Present Illness   Kalyn Hofstra is a 68 y.o. White female who presents to the clinic as a follow-up for continues chest pain/bruning since her CABG in 2024. She underwent CABG x 1 - LIMA-LAD with Dr. Mick Alamin on 02/06/23. She is doing well, but continues to experience sternal discomfort in her chest that she describes as pins and needles sensation, but often has burning type pain and occasional sharp pains. She has been on Neurontin  without significant relief from the pain. She has been referred to a pain specialist and is scheduled to see them on 4/2. She experiences the pain daily. She underwent CT of the chest today which showed no acute abnormalities of the chest.She denies any trauma to the chest or falls. She is joined with her daughter today. Pain is not related to exertion.     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    Chest pain, unspecified 02/06/2023    CAD in native artery 01/24/2022    Essential hypertension 01/24/2022    Hyperlipidemia 01/24/2022    Tobacco use 01/24/2022    History of coronary artery stent placement 01/24/2022       Allergies  No Known Allergies    Medications    Current Outpatient Medications:     anastrozole  (ARIMIDEX ) 1 mg Oral Tablet, Take 1 Tablet (1 mg total) by mouth Daily, Disp: , Rfl:      aspirin  81 mg Oral Tablet, Chewable, Chew 1 Tablet (81 mg total) Once a day, Disp: 30 Tablet, Rfl: 2    atorvastatin  (LIPITOR) 40 mg Oral Tablet, Take 1 Tablet (40 mg total) by mouth Daily, Disp: , Rfl:     cholecalciferol , vitamin D3, 25 mcg (1,000 unit) Oral Tablet, Take 1 Tablet (1,000 Units total) by mouth Daily, Disp: , Rfl:     clonazePAM  (KLONOPIN ) 0.5 mg Oral Tablet, Take 1 Tablet (0.5 mg total) by mouth Twice per day as needed for Other (ANXIETY), Disp: , Rfl:     DULoxetine (CYMBALTA DR) 60 mg Oral Capsule, Delayed Release(E.C.), Take 1 Capsule (60 mg total) by mouth Daily, Disp: , Rfl:     fluticasone  propionate (FLONASE ) 50 mcg/actuation Nasal Spray, Suspension, 1 Spray Once per day as needed for Other (CONGESTION), Disp: , Rfl:     gabapentin  (NEURONTIN ) 400 mg Oral Capsule, Take 1 Capsule (400 mg total) by mouth Daily, Disp: , Rfl:     losartan (COZAAR) 25 mg Oral Tablet, Take 1 Tablet (25 mg total) by mouth Once a day, Disp: 90 Tablet, Rfl: 1    metoprolol  tartrate (LOPRESSOR ) 25 mg Oral Tablet, Take 1 Tablet (25 mg total) by mouth Twice daily, Disp: , Rfl:  MV,Ca,Ir,Min-FA-Lut-Lyco-HC153 3-133-33 mg-mcg-mcg Oral Capsule, Take 1 Capsule by mouth Daily, Disp: , Rfl:     omega-3 fatty acid (LOVAZA ) 1 gram Oral Capsule, Take 1 Capsule (1 g total) by mouth Daily, Disp: , Rfl:     pantoprazole  (PROTONIX ) 40 mg Oral Tablet, Delayed Release (E.C.), Take 1 Tablet (40 mg total) by mouth Daily, Disp: , Rfl:     sertraline  (ZOLOFT ) 100 mg Oral Tablet, Take 1 Tablet (100 mg total) by mouth Daily, Disp: , Rfl:     traZODone  (DESYREL ) 150 mg Oral Tablet, Take 1 Tablet (150 mg total) by mouth Every night, Disp: , Rfl:     History  Past Medical History:   Diagnosis Date    Chronic lung disease     Coronary artery disease     Dyslipidemia     Essential hypertension     History of percutaneous coronary intervention     History of stress test     Unspecified chronic bronchitis (CMS HCC)        Past Surgical History:    Procedure Laterality Date    CARDIAC CATHETERIZATION      CORONARY ANGIOPLASTY      CORONARY ARTERY STENT PLACEMENT      HX BACK SURGERY      HX CORONARY ARTERY BYPASS GRAFT      HX MASTECTOMY, SIMPLE Bilateral     HX TUBAL LIGATION      NECK SURGERY       Social History     Socioeconomic History    Marital status: Widowed   Tobacco Use    Smoking status: Every Day     Current packs/day: 0.50     Types: Cigarettes    Smokeless tobacco: Never   Vaping Use    Vaping status: Never Used   Substance and Sexual Activity    Alcohol use: Yes     Comment: occasional    Drug use: Yes     Types: Marijuana     Social Determinants of Health     Social Connections: Low Risk  (02/08/2023)    Social Connections     SDOH Social Isolation: 5 or more times a week     Family Medical History:       Problem Relation (Age of Onset)    Cancer Father    Heart Attack Father    Heart Disease Brother    Pacemaker Mother            Review of Systems:  General: Denies fever or chills. No fatigue.   CNS: Denies syncope or dizziness. No headache.  Eyes: Denies recent visual changes. No pain.  Ears, Nose, and Throat: Denies ear pain, nasal congestion, or sore throat.  Endocrine: Denies excessive hunger or thirst. No heat/cold intolerance.  Respiratory: Denies shortness of breath. No cough.  Cardiovascular: Per HPI Denies claudication or varicose veins.  GI: Denies nausea/vomiting. No black/bloody bowel movements.  GU: Denies hematuria or dysuria. No incontinence.  Musculoskeletal: Denies joint swelling or decreased ROM.  Skin: No rashes, hives, or eczema.  Psychiatric: Denies anxiety, depression, or insomnia.  Hematologic: No bleedings disorders or swollen nodes.    All other systems reviewed and either negative or not pertinent.     Physical Examination:  BP 137/81   Pulse 72   Ht 1.549 m (5\' 1" )   Wt 63 kg (139 lb)   SpO2 95%   BMI 26.26 kg/m     General: Awake. Well nourished. No acute  distress. A&O x 3.  HEENT: Normocephalic, atraumatic.  PERRL. Normal hearing.  Neck: No JVD or bruit. Trachea midline.  Lungs: Clear to auscultation. No rhonchi or wheezing noted. Unlabored.  Heart: Regular rate and rhythm. No murmurs, rubs, or gallops are appreciated.  Abdomen: BS normal x 4 quadrants. Soft, non-tender, non-distended.  Extremities: No clubbing, cyanosis, or edema.  Psychiatric: Cooperative, appropriate mood and affect.  Musculoskeletal: Strength grips are equal. No red, swollen joints. MAE.  Skin: Warm, dry, and intact. No rashes or hives are noted.     No orders of the defined types were placed in this encounter.      There are no discontinued medications.    Assessment and Plan:  Assessment/Plan   1. CAD in native artery    2. S/P CABG x 1    3. Intercostal neuropathic pain    4. Essential hypertension    5. PAF (paroxysmal atrial fibrillation) (CMS HCC)    6. Hyperlipidemia, unspecified hyperlipidemia type        PLAN:   CT scan was reviewed- no acute abnormalities noted   No surgical intervention is needed at this time   Recommend to keep appointment with pain specialist  as this is likely neuropathic pain from the surgery.   Follow-up in our office as needed     Follow up:  No follow-ups on file.    The patient was given the opportunity to ask questions and those questions were answered to the patient's satisfaction. The patient was encouraged to call with any additional questions or concerns. Discussed with the effects and side effects of medications. Medication safety was discussed. The patient was informed to contact the office within 7 business days if a message/lab results/referral/imaging results have not been conveyed to the patient.     Ovidio Blower, APRN  [facility]  Cardiothoracic Surgery   [facility]    A portion of this documentation may have been generated using [facility] voice recognition software and may contain syntax/voice recognition errors.

## 2024-01-10 ENCOUNTER — Encounter (HOSPITAL_BASED_OUTPATIENT_CLINIC_OR_DEPARTMENT_OTHER): Payer: Self-pay | Admitting: PAIN MANAGEMENT

## 2024-01-10 ENCOUNTER — Other Ambulatory Visit: Payer: Self-pay

## 2024-01-10 ENCOUNTER — Ambulatory Visit: Payer: Self-pay | Attending: PAIN MANAGEMENT | Admitting: PAIN MANAGEMENT

## 2024-01-10 VITALS — BP 120/89 | Ht 61.0 in | Wt 139.0 lb

## 2024-01-10 DIAGNOSIS — M545 Low back pain, unspecified: Secondary | ICD-10-CM | POA: Insufficient documentation

## 2024-01-10 DIAGNOSIS — G894 Chronic pain syndrome: Secondary | ICD-10-CM | POA: Insufficient documentation

## 2024-01-10 DIAGNOSIS — Z951 Presence of aortocoronary bypass graft: Secondary | ICD-10-CM

## 2024-01-10 DIAGNOSIS — R0789 Other chest pain: Secondary | ICD-10-CM | POA: Insufficient documentation

## 2024-01-10 LAB — URINE DRUG SCREEN-PAIN CLINIC (NO THC)
AMPHETAMINES URINE: NEGATIVE
BARBITURATES URINE: NEGATIVE
BENZODIAZEPINES URINE: POSITIVE — AB
BUPRENORPHINE URINE: NEGATIVE
COCAINE METABOLITES URINE: NEGATIVE
CREAT-ADULTERATION: 88.8 mg/dL (ref 22.0–250.0)
FENTANYL, URINE: NEGATIVE
HEROIN URINE: NEGATIVE
METHADONE URINE: NEGATIVE
OPIATES URINE: NEGATIVE
OXYCODONE URINE: NEGATIVE
PH-ADULTERATION: 5.4 (ref 4.5–8.0)

## 2024-01-10 NOTE — H&P (Signed)
 PAIN MANAGEMENT, [facility]  [address]  [place] [address]  Operated by [facility]  History and Physical    Name: [name] MRN:  [id number]   Date: 01/10/2024 DOB:  1956-02-17 (68 y.o.)               Provider: Rollin Clock, MD  PCP: No Pcp  Referring Provider: No Pcp     Reason for visit: No chief complaint on file.      Objective:  Nursing Notes:   [name], RN  01/10/24 1409  Signed  New patient referred by Dr. Jerrilyn Moras for  burning chest pain (after open heart surgery).  Surgery was April 2024.  "The pain started about 6  months ago.  The pain is worse since it started.  Sometimes the pain is in my neck and goes down to my left hip.      "My back pain is worse right now.  Pain radiating into bilateral legs with the left leg being worse.  It hurts to touch."    No physical therapy.  CT chest with chart.  No radiology for back.    Chest nerve pain current 8 worst 10 lowest 7  Pain is describe as constant aching, stabbing, soreness  Pain increase with housework,  bending, walking, standing  Pain decreases with Tylenol  Arthritis. Laying down, sitting with pillow    Pt is currently taking Tylenol , Gabapentin , Marijuana     Pt is a Dr.Anhar Mcdermott patient      Oswestry Low Back Pain Disability Questionnaire  Oswestry Disability Index    Please complete this questionnaire.  It is designed to tell us  how your back pain affects your ability to function in every day life.    Have you had "Chronic Pain" or pain that has bothered you for 3 months or more:  I have "Chronic Pain" or pain that has bothered me for 3 months or more: Yes    Questionnaire filled out:        Section 1: PAIN INTENSITY  Pain Intensity : 5- The pain is the worst imaginable at the moment.    Section 2: PERSONAL CARE  Personal Care: 1- I can look after myself normally but it causes extra pain.    Section 3: LIFTING  Lifting: 4- I can lift very light weights.    Section 4: WALKING  Walking: 3-  Pain prevents me from walking more than 100 yards.    Section 5: SITTING  Sitting: 2- Pain prevents me sitting more than 1 hour.    Section 6: STANDING  Standing: 1- I can stand as long as I want but it gives me extra pain.    Section 7: SLEEPING  Sleeping: 2- Because of pain I have less than 6 hours sleep.    Section 8: SOCIAL LIFE  Social Life: 0- My social life is normal and gives me no extra pain.    Section 9: TRAVELING  Traveling: 3- Pain restricts me to journeys of less than 1 hour.    TOTAL SCORE FROM ALL SECTIONS  ODI Total Score Oswestry LBP: 21    Disability Percentage:  ODI %: 42 %        INTERPRETATION:    ODI Scoring:  0% to 20% (minimal disability): Patients can cope with most activities of daily living.  No treatment may be indicated except for suggestions on lifting, posture, physical fitness and diet.  Patients with  sedentary occupations (ex. Secretaries) may experience more problems than others.    21% to 40% (moderate disability):  Patients ma experience more pain and problems with sitting, lifting, and standing.  Travel and social life are more difficult.  Patients may be off work.  Personal care, sleeping and sexual activity may not be grossly affected. Conservative treatment may be sufficient.    41% to 60% (severe disability):  Pain is a primary problem for these patients, but they may also be experiencing significant problems in travel, personal care, social life, sexual activity and sleep.  A detailed evaluation is appropriate.  61% to 80% (crippled):  Back pain has an impact on all aspects of daily living and work.  Active treatment is required.    81%-100% :  These patients may be bed bound or exaggerating their symptoms.  Careful evaluation is recommended.  ,       01/10/2024   NECK DISABILITY INDEX   Pain Intensity 2   Personal Care (Washing, Dressing, etc.) 0   Lifting 3   Reading 3   Headaches 3   Concentration 4   Work 4   Driving 5   Sleeping 4   Recreation 5   Score 33   Neck  Disability Percentage 66     ,       01/10/2024   Conservative Therapies   Type of Injury None   Has the patient participated in Physical Therapy? No   Has the patient participated in Chiropractic Manipulation? No   Has the patient participated in Home Exercise? Yes   Dates of home exercise ongoing   Actively participating in home exercise Yes   Any relief with home exercise Yes   Additional Previous Treatments None   Previous Medications NSAIDS;Narcotics;Muscle relaxants;Acetaminophen    Surgical Eval --   Surgical Eval - Comments [place] - does not remember name       Previous XRays:    Previous CTs:  No results found for this or any previous visit.      Previous MRIs  No results found for this or any previous visit.      E.Starcher, RN    HPI:  HPI    Past Medical History:  Past Medical History:   Diagnosis Date    Chronic lung disease     Coronary artery disease     Dyslipidemia     Essential hypertension     History of percutaneous coronary intervention     History of stress test     Unspecified chronic bronchitis      Past Surgical History:   Procedure Laterality Date    CARDIAC CATHETERIZATION      CORONARY ANGIOPLASTY      CORONARY ARTERY STENT PLACEMENT      HX BACK SURGERY      HX CORONARY ARTERY BYPASS GRAFT      HX MASTECTOMY, SIMPLE Bilateral     HX TUBAL LIGATION      NECK SURGERY        No Known Allergies  Current Outpatient Medications   Medication Sig    anastrozole  (ARIMIDEX ) 1 mg Oral Tablet Take 1 Tablet (1 mg total) by mouth Daily    aspirin  81 mg Oral Tablet, Chewable Chew 1 Tablet (81 mg total) Once a day    atorvastatin  (LIPITOR) 40 mg Oral Tablet Take 1 Tablet (40 mg total) by mouth Daily (Patient not taking: Reported on 01/10/2024)    cholecalciferol , vitamin D3, 25 mcg (1,000 unit)  Oral Tablet Take 1 Tablet (1,000 Units total) by mouth Daily    clonazePAM  (KLONOPIN ) 0.5 mg Oral Tablet Take 1 Tablet (0.5 mg total) by mouth Twice per day as needed for Other (ANXIETY)    DULoxetine (CYMBALTA  DR) 60 mg Oral Capsule, Delayed Release(E.C.) Take 1 Capsule (60 mg total) by mouth Daily    fluticasone  propionate (FLONASE ) 50 mcg/actuation Nasal Spray, Suspension 1 Spray Once per day as needed for Other (CONGESTION)    gabapentin  (NEURONTIN ) 400 mg Oral Capsule Take 1 Capsule (400 mg total) by mouth Daily    losartan (COZAAR) 25 mg Oral Tablet Take 1 Tablet (25 mg total) by mouth Once a day    metoprolol  tartrate (LOPRESSOR ) 25 mg Oral Tablet Take 1 Tablet (25 mg total) by mouth Twice daily    MV,Ca,Ir,Min-FA-Lut-Lyco-HC153 3-133-33 mg-mcg-mcg Oral Capsule Take 1 Capsule by mouth Daily    omega-3 fatty acid (LOVAZA ) 1 gram Oral Capsule Take 1 Capsule (1 g total) by mouth Daily (Patient not taking: Reported on 01/10/2024)    pantoprazole  (PROTONIX ) 40 mg Oral Tablet, Delayed Release (E.C.) Take 1 Tablet (40 mg total) by mouth Daily (Patient not taking: Reported on 01/10/2024)    sertraline  (ZOLOFT ) 100 mg Oral Tablet Take 1 Tablet (100 mg total) by mouth Daily (Patient not taking: Reported on 01/10/2024)    traZODone  (DESYREL ) 150 mg Oral Tablet Take 1 Tablet (150 mg total) by mouth Every night     Family Medical History:       Problem Relation (Age of Onset)    Cancer Father, Other    Heart Attack Father, Other    Heart Disease Brother, Other    Hypertension (High Blood Pressure) Other    Pacemaker Mother           Social History     Socioeconomic History    Marital status: Widowed   Tobacco Use    Smoking status: Every Day     Current packs/day: 0.50     Types: Cigarettes    Smokeless tobacco: Never   Vaping Use    Vaping status: Never Used   Substance and Sexual Activity    Alcohol use: Yes     Comment: occasional    Drug use: Yes     Types: Marijuana     Social Determinants of Health     Social Connections: Low Risk  (02/08/2023)    Social Connections     SDOH Social Isolation: 5 or more times a week       Physical Exam:  Vital Signs:  BP 120/89   Ht 1.549 m (5\' 1" )   Wt 63 kg (139 lb)   BMI 26.26 kg/m          Physical Exam  Chest:          Comments: Tenderness to palpation over the chest wall...  Anterior chest wall with a CABG area        Assessment:    ICD-10-CM    1. Chronic pain syndrome  G89.4 URINE DRUG SCREEN-PAIN CLINIC (NO THC)      2. Anterior chest wall pain  R07.89       3. Low back pain  M54.50            Plan:  Orders Placed This Encounter    URINE DRUG SCREEN-PAIN CLINIC (NO THC)     Patient presents today with chronic intractable anterior chest wall pain.  She reports that she has had CABG about  a year ago but she continues to have chest wall pain which is quite severe in intensity.  Patient reports that she has tried gabapentin  without much help.  Her other complaint is low back and leg pain.  She was recently diagnosed with gallbladder stone and kidney stones.  She has not been address for this problem yet.  I recommended that she follow up with her primary care physician and possibly general surgeon for gallstone and kidney stone removal.  As far as her anterior chest wall pain is concerned I recommend compound pharmacy cream containing ketamine, lidocaine, gabapentin  to apply over the area of pain 3 to 4 times a day to see if this will adequately control her pain.  Other treatment options to consider is either trigger point injection or even possible spinal stimulator to better cover anterior chest wall pain if necessary.  I will also consider MRI of the lumbar spine if low back pain does not improve after gallstone and kidney stone removal.  Patient denies any new injury or trauma.  Patient denies any new neurological symptom changes suggestive bladder or bowel dysfunction.  Patient to call back for appointment if the pain level changes for worse.  I will discuss physical therapy, injection therapy or medication changes if necessary.  Patient will call the office if there is any questions or other new problems.  Long-term goal is to improve pain, function, and quality of life with minimally  invasive procedures.    Return in about 3 months (around 04/10/2024).    Rollin Clock, MD     Portions of this note may be dictated using voice recognition software or a dictation service. Variances in spelling and vocabulary are possible and unintentional. Not all errors are caught/corrected. Please notify the [facility] if any discrepancies are noted or if the meaning of any statement is not clear.

## 2024-01-10 NOTE — Nursing Note (Signed)
 New patient referred by Dr. Jerrilyn Moras for  burning chest pain (after open heart surgery).  Surgery was April 2024.  "The pain started about 6  months ago.  The pain is worse since it started.  Sometimes the pain is in my neck and goes down to my left hip.      "My back pain is worse right now.  Pain radiating into bilateral legs with the left leg being worse.  It hurts to touch."    No physical therapy.  CT chest with chart.  No radiology for back.    Chest nerve pain current 8 worst 10 lowest 7  Pain is describe as constant aching, stabbing, soreness  Pain increase with housework,  bending, walking, standing  Pain decreases with Tylenol  Arthritis. Laying down, sitting with pillow    Pt is currently taking Tylenol , Gabapentin , Marijuana     Pt is a Dr.Kim patient      Oswestry Low Back Pain Disability Questionnaire  Oswestry Disability Index    Please complete this questionnaire.  It is designed to tell us  how your back pain affects your ability to function in every day life.    Have you had "Chronic Pain" or pain that has bothered you for 3 months or more:  I have "Chronic Pain" or pain that has bothered me for 3 months or more: Yes    Questionnaire filled out:        Section 1: PAIN INTENSITY  Pain Intensity : 5- The pain is the worst imaginable at the moment.    Section 2: PERSONAL CARE  Personal Care: 1- I can look after myself normally but it causes extra pain.    Section 3: LIFTING  Lifting: 4- I can lift very light weights.    Section 4: WALKING  Walking: 3- Pain prevents me from walking more than 100 yards.    Section 5: SITTING  Sitting: 2- Pain prevents me sitting more than 1 hour.    Section 6: STANDING  Standing: 1- I can stand as long as I want but it gives me extra pain.    Section 7: SLEEPING  Sleeping: 2- Because of pain I have less than 6 hours sleep.    Section 8: SOCIAL LIFE  Social Life: 0- My social life is normal and gives me no extra pain.    Section 9: TRAVELING  Traveling: 3- Pain restricts me to  journeys of less than 1 hour.    TOTAL SCORE FROM ALL SECTIONS  ODI Total Score Oswestry LBP: 21    Disability Percentage:  ODI %: 42 %        INTERPRETATION:    ODI Scoring:  0% to 20% (minimal disability): Patients can cope with most activities of daily living.  No treatment may be indicated except for suggestions on lifting, posture, physical fitness and diet.  Patients with sedentary occupations (ex. Secretaries) may experience more problems than others.    21% to 40% (moderate disability):  Patients ma experience more pain and problems with sitting, lifting, and standing.  Travel and social life are more difficult.  Patients may be off work.  Personal care, sleeping and sexual activity may not be grossly affected. Conservative treatment may be sufficient.    41% to 60% (severe disability):  Pain is a primary problem for these patients, but they may also be experiencing significant problems in travel, personal care, social life, sexual activity and sleep.  A detailed evaluation is appropriate.  61% to  80% (crippled):  Back pain has an impact on all aspects of daily living and work.  Active treatment is required.    81%-100% :  These patients may be bed bound or exaggerating their symptoms.  Careful evaluation is recommended.  ,       01/10/2024   NECK DISABILITY INDEX   Pain Intensity 2   Personal Care (Washing, Dressing, etc.) 0   Lifting 3   Reading 3   Headaches 3   Concentration 4   Work 4   Driving 5   Sleeping 4   Recreation 5   Score 33   Neck Disability Percentage 66     ,       01/10/2024   Conservative Therapies   Type of Injury None   Has the patient participated in Physical Therapy? No   Has the patient participated in Chiropractic Manipulation? No   Has the patient participated in Home Exercise? Yes   Dates of home exercise ongoing   Actively participating in home exercise Yes   Any relief with home exercise Yes   Additional Previous Treatments None   Previous Medications NSAIDS;Narcotics;Muscle  relaxants;Acetaminophen    Surgical Eval --   Surgical Eval - Comments [place] - does not remember name       Previous XRays:    Previous CTs:  No results found for this or any previous visit.      Previous MRIs  No results found for this or any previous visit.      E.Jojuan Champney, RN

## 2024-01-11 ENCOUNTER — Other Ambulatory Visit (INDEPENDENT_AMBULATORY_CARE_PROVIDER_SITE_OTHER): Payer: Self-pay | Admitting: NURSE PRACTITIONER

## 2024-01-11 DIAGNOSIS — N200 Calculus of kidney: Secondary | ICD-10-CM

## 2024-01-11 DIAGNOSIS — R109 Unspecified abdominal pain: Secondary | ICD-10-CM

## 2024-01-11 DIAGNOSIS — K8000 Calculus of gallbladder with acute cholecystitis without obstruction: Secondary | ICD-10-CM

## 2024-01-12 ENCOUNTER — Emergency Department
Admission: EM | Admit: 2024-01-12 | Discharge: 2024-01-12 | Disposition: A | Discharge status: Home / Self Care | Attending: Physician Assistant | Admitting: Physician Assistant

## 2024-01-12 ENCOUNTER — Emergency Department (HOSPITAL_COMMUNITY)

## 2024-01-12 ENCOUNTER — Other Ambulatory Visit: Payer: Self-pay

## 2024-01-12 DIAGNOSIS — Z853 Personal history of malignant neoplasm of breast: Secondary | ICD-10-CM

## 2024-01-12 DIAGNOSIS — K5730 Diverticulosis of large intestine without perforation or abscess without bleeding: Secondary | ICD-10-CM

## 2024-01-12 DIAGNOSIS — R109 Unspecified abdominal pain: Secondary | ICD-10-CM

## 2024-01-12 DIAGNOSIS — E860 Dehydration: Secondary | ICD-10-CM | POA: Insufficient documentation

## 2024-01-12 DIAGNOSIS — R1011 Right upper quadrant pain: Secondary | ICD-10-CM

## 2024-01-12 DIAGNOSIS — K760 Fatty (change of) liver, not elsewhere classified: Secondary | ICD-10-CM | POA: Insufficient documentation

## 2024-01-12 DIAGNOSIS — R1013 Epigastric pain: Secondary | ICD-10-CM | POA: Insufficient documentation

## 2024-01-12 LAB — COMPREHENSIVE METABOLIC PANEL, NON-FASTING
ALBUMIN: 4.5 g/dL (ref 3.5–5.2)
ALKALINE PHOSPHATASE: 99 U/L (ref 35–129)
ALT (SGPT): 15 U/L (ref 0–33)
ANION GAP: 15 mmol/L
AST (SGOT): 27 U/L (ref 0–32)
BILIRUBIN TOTAL: 0.4 mg/dL (ref 0.2–1.2)
BUN: 28 mg/dL — ABNORMAL HIGH (ref 8–23)
CALCIUM: 9.7 mg/dL (ref 8.3–10.7)
CHLORIDE: 99 mmol/L (ref 96–106)
CO2 TOTAL: 24 mmol/L (ref 22–30)
CREATININE: 1.05 mg/dL — ABNORMAL HIGH (ref 0.50–0.90)
ESTIMATED GFR: 59 mL/min/{1.73_m2} — ABNORMAL LOW (ref >90)
GLUCOSE: 168 mg/dL — ABNORMAL HIGH (ref 74–109)
POTASSIUM: 4.9 mmol/L (ref 3.2–5.0)
PROTEIN TOTAL: 7.1 g/dL (ref 6.4–8.3)
SODIUM: 138 mmol/L (ref 133–144)

## 2024-01-12 LAB — LACTIC ACID LEVEL W/ REFLEX FOR LEVEL >2.0: LACTIC ACID: 2.8 mmol/L — ABNORMAL HIGH (ref 0.5–2.0)

## 2024-01-12 LAB — CBC WITH DIFF
BASOPHIL #: 0.1 10*3/uL (ref <=0.20)
BASOPHIL %: 0.8 %
EOSINOPHIL #: 0.48 10*3/uL (ref <=0.50)
EOSINOPHIL %: 3.7 %
HCT: 41.5 % (ref 34.8–46.0)
HGB: 13.2 g/dL (ref 11.5–16.0)
IMMATURE GRANULOCYTE #: <0.1 10*3/uL (ref <0.10)
IMMATURE GRANULOCYTE %: 0.4 % (ref 0.0–1.0)
LYMPHOCYTE #: 3.54 10*3/uL (ref 1.00–4.80)
LYMPHOCYTE %: 27.5 %
MCH: 28.8 pg (ref 26.0–32.0)
MCHC: 31.8 g/dL (ref 31.0–35.5)
MCV: 90.4 fL (ref 78.0–100.0)
MONOCYTE #: 0.61 10*3/uL (ref 0.20–1.10)
MONOCYTE %: 4.7 %
MPV: 9.2 fL (ref 8.7–12.5)
NEUTROPHIL #: 8.11 10*3/uL — ABNORMAL HIGH (ref 1.50–7.70)
NEUTROPHIL %: 62.9 %
PLATELETS: 238 10*3/uL (ref 150–400)
RBC: 4.59 10*6/uL (ref 3.85–5.22)
RDW-CV: 13 % (ref 11.5–15.5)
WBC: 12.9 10*3/uL — ABNORMAL HIGH (ref 3.7–11.0)

## 2024-01-12 LAB — LACTIC ACID - FIRST REFLEX: LACTIC ACID: 1.2 mmol/L (ref 0.5–2.0)

## 2024-01-12 LAB — URINALYSIS, MACRO/MICRO
BACTERIA: 18.3 /HPF — ABNORMAL HIGH (ref <=0)
BILIRUBIN: NEGATIVE mg/dL
BLOOD: NEGATIVE mg/dL
GLUCOSE: NEGATIVE mg/dL
KETONES: NEGATIVE mg/dL
LEUKOCYTES: NEGATIVE WBCs/uL
NITRITE: NEGATIVE
PH: 6 (ref 5.0–7.0)
PROTEIN: NEGATIVE mg/dL
RBCS: <2 /HPF (ref <=3.0)
SPECIFIC GRAVITY: 1.009 (ref 1.005–1.025)
SQUAMOUS EPITHELIAL CELLS: <1.4 /HPF (ref 0.0–10.0)
TOTAL CASTS: <1.4 /LPF (ref <=2.0)
UROBILINOGEN: 0.2 mg/dL
WBCS: <1.8 /HPF (ref <=4.0)

## 2024-01-12 LAB — BENZODIAZEPINES CONFIRMATION (LAB ONLY)
7-AMINOCLONAZEPAM: 144 ng/mL — AB
ALPRAZOLAM: NEGATIVE ng/mL
CLONAZEPAM: NEGATIVE ng/mL
DESALKYLFLURAZEPAM: NEGATIVE ng/mL
DIAZEPAM: NEGATIVE ng/mL
FLUNITRAZEPAM: NEGATIVE ng/mL
LORAZEPAM: NEGATIVE ng/mL
NORDIAZEPAM: NEGATIVE ng/mL
OXAZEPAM: NEGATIVE ng/mL
TEMAZEPAM: NEGATIVE ng/mL
a-HYDROXYALPRAZOLAM: NEGATIVE ng/mL

## 2024-01-12 LAB — LIPASE: LIPASE: 32 U/L (ref 13–60)

## 2024-01-12 LAB — TROPONIN-T: TROPONIN-T: 13 ng/L (ref <=14)

## 2024-01-12 MED ORDER — PROMETHAZINE 25 MG TABLET
25.0000 mg | ORAL_TABLET | ORAL | Status: AC
Start: 2024-01-12 — End: 2024-01-12
  Administered 2024-01-12: 25 mg via ORAL
  Filled 2024-01-12: qty 1

## 2024-01-12 MED ORDER — SODIUM CHLORIDE 0.9 % IV BOLUS
1000.0000 mL | INJECTION | Status: AC
Start: 2024-01-12 — End: 2024-01-12
  Administered 2024-01-12: 1000 mL via INTRAVENOUS
  Administered 2024-01-12: 0 mL via INTRAVENOUS

## 2024-01-12 MED ORDER — PROMETHAZINE 25 MG TABLET
25.0000 mg | ORAL_TABLET | Freq: Three times a day (TID) | ORAL | 0 refills | Status: DC | PRN
Start: 2024-01-12 — End: 2024-06-17

## 2024-01-12 MED ORDER — IOPAMIDOL 370 MG IODINE/ML (76 %) INTRAVENOUS SOLUTION
75.0000 mL | INTRAVENOUS | Status: AC
Start: 2024-01-12 — End: 2024-01-12
  Administered 2024-01-12: 75 mL via INTRAVENOUS

## 2024-01-12 NOTE — ED Triage Notes (Signed)
 [name] is a 68 y.o. female presenting to the ED today for:  Low Back Pain, Shoulder Pain, and Epigastric Pain    Additional History:     Patient presents with chest pain, abdominal pain, nausea. She states she has gallstones, is scheduled to see a surgeon later this month for the gallstones. She denies shortness of breath, headache, dizziness.       Pertinent Exam findings:  Filed Vitals:    01/12/24 1434   BP: 105/83   Pulse: 81   Resp: 18   Temp: 36.6 C (97.9 F)   SpO2: 98%       Gen: Nontoxic in appearance.  Head: NC/AT  Neck: Trachea midline. No gross meningismus.  Psych: Denis SI/HI.    Assessment: Medical Screening Exam.    Plan/MDM at Triage:  I have considered labs, imaging, EKG.  These have been ordered as clinically indicated.  I have considered that the patient may need medications.  However, due to treatment space limitation, medications considered have to be appropriate for the waiting room.    I have considered the patient's chronic conditions and have counseled the patient that we will be evaluating for the presence of an emergency medical condition today in which their chronic conditions may or may not play a role.  I have advised that the patient should notify their PCP of today's visit regardless of disposition.  I have considered the patient's social determinants of health and health care literacy while determining the patient's initial plan of care.  I have further advised that the patient will possibly see a 2nd emergency provider today during their stay to help promote throughput.    I have performed an initial medical screening evaluation in order to determine if the patient has an emergency medical condition. The patient has been evaluated.  Imminent life, limb, or sight threatening emergency will be taken to the treatment area immediately. The patient's orders will be reviewed by nursing staff and placed in the treatment area as soon as possible as bed capacity allows.  Should  the patient's status change, or a new sign/symptom develop, immediate notification of nursing or ED staff has been advised.

## 2024-01-12 NOTE — ED Provider Notes (Signed)
 [facility] - Emergency Department  ED Primary Provider Note  History of Present Illness   Chief Complaint   Patient presents with    Low Back Pain    Shoulder Pain    Epigastric Pain     Patient reports that she had an MRI recently and was dx'd with gallstones and believes that she also has kidney stones. Pt c/o pain in her low back, in her right shoulder and in her epigastric area. Pt denies any urinary symptoms but is having some nausea. Pt has an appt with someone on 01/23/2024 to have her gallbladder looked at but is unsure who.      [name] is a 68 y.o. female who had concerns including Low Back Pain, Shoulder Pain, and Epigastric Pain.  Arrival: The patient arrived by Car    68 year old female with a history of hypertension, hyperlipidemia, CAD with CABG and stent, paroxysmal atrial fibrillation as well as breast cancer presents with a ER for evaluation of 6 months of intermittent abdominal and lower back pain along with nausea.  Patient feels as though it is progressively getting worse.  Patient did have a recent CT scan of the chest which did note gallstones as well as kidney stones.  Patient reports she does have an upcoming appointment with general surgeon Dr. Jean Rosenthal for discussion regarding her gallbladder.  Patient denies any current chest pain, shortness of breath, fever, urinary symptoms or any additional symptom at this time.        History Reviewed This Encounter:     Physical Exam   ED Triage Vitals   BP (Non-Invasive) 01/12/24 1434 105/83   Heart Rate 01/12/24 1434 81   Respiratory Rate 01/12/24 1434 18   Temperature 01/12/24 1434 36.6 C (97.9 F)   SpO2 01/12/24 1430 95 %   Weight 01/12/24 1434 63 kg (139 lb)   Height 01/12/24 1434 1.549 m (5\' 1" )     Physical Exam  Vitals and nursing note reviewed.   Constitutional:       General: She is not in acute distress.     Appearance: She is well-developed.   HENT:      Head: Normocephalic and atraumatic.   Eyes:       Conjunctiva/sclera: Conjunctivae normal.   Cardiovascular:      Rate and Rhythm: Normal rate and regular rhythm.   Pulmonary:      Effort: Pulmonary effort is normal. No respiratory distress.      Breath sounds: Normal breath sounds.   Abdominal:      Palpations: Abdomen is soft.      Tenderness: There is abdominal tenderness.   Musculoskeletal:         General: No swelling.   Skin:     General: Skin is warm and dry.      Capillary Refill: Capillary refill takes less than 2 seconds.   Neurological:      General: No focal deficit present.      Mental Status: She is alert and oriented to person, place, and time.      Cranial Nerves: Cranial nerve deficit: mild generalized.   Psychiatric:         Mood and Affect: Mood normal.       Patient Data     Labs Ordered/Reviewed   COMPREHENSIVE METABOLIC PANEL, NON-FASTING - Abnormal; Notable for the following components:       Result Value    BUN 28 (*)     CREATININE  1.05 (*)     ESTIMATED GFR 59 (*)     GLUCOSE 168 (*)     All other components within normal limits   LACTIC ACID LEVEL W/ REFLEX FOR LEVEL >2.0 - Abnormal; Notable for the following components:    LACTIC ACID 2.8 (*)     All other components within normal limits   CBC WITH DIFF - Abnormal; Notable for the following components:    WBC 12.9 (*)     NEUTROPHIL # 8.11 (*)     All other components within normal limits   URINALYSIS, MACRO/MICRO - Abnormal; Notable for the following components:    BACTERIA 18.3 (*)     All other components within normal limits   LIPASE - Normal   TROPONIN-T - Normal    Narrative:     In order to distinguish acute elevations of high sensitive Troponin from other clinical conditions, the Universal Definition of myocardial infarction stresses clinical assessment and the need for serial measurements to observe a rise and/or fall above the upper limit of the reference interval.    LACTIC ACID - FIRST REFLEX - Normal   URINE CULTURE,ROUTINE   CBC/DIFF    Narrative:     The following orders  were created for panel order CBC/DIFF.  Procedure                               Abnormality         Status                     ---------                               -----------         ------                     CBC WITH ZOXW[[id number]]                Abnormal            Final result                 Please view results for these tests on the individual orders.   URINALYSIS WITH REFLEX MICROSCOPIC AND CULTURE IF POSITIVE    Narrative:     The following orders were created for panel order URINALYSIS WITH REFLEX MICROSCOPIC AND CULTURE IF POSITIVE.  Procedure                               Abnormality         Status                     ---------                               -----------         ------                     URINALYSIS, MACRO/MICRO[[id number]]      Abnormal            Final result                 Please view results for these  tests on the individual orders.     CT ABDOMEN PELVIS W IV CONTRAST   Final Result by Edi, Radresults In (04/04 1833)   No CT evidence of acute pathology involving the abdomen and pelvis. Multiple ancillary findings as above.      .   .   .   s/d/g            Radiologist location ID: [id number]         [place] RT UPPER QUADRANT   Final Result by Edi, Radresults In (04/04 1826)   No [place] evidence of acute pathology.   Question medullary nephrocalcinosis.   Fatty liver disease.   .   .   .   s/d/g            Radiologist location ID: [id number]           Medical Decision Making        Medical Decision Making  68 year old female with a history of hypertension, hyperlipidemia, CAD with CABG and stent, paroxysmal atrial fibrillation as well as breast cancer presents with a ER for evaluation of 6 months of intermittent abdominal and lower back pain along with nausea.  Routine labs as well as lipase, lactic acid, urinalysis, cardiac enzymes, EKG, right upper quadrant ultrasound and CT of the abdomen/pelvis with contrast was ordered given patient's medical history and symptoms.  EKG reveals sinus  rhythm at a rate of 68 with no ST elevation MI.  Lab results reveal white blood cell count 12.9, hemoglobin/hematocrit 13.2/41.5 with platelet count 238.    Sodium 138, potassium 4.9, CO2 24, BUN/creatinine 28/1.05 with previous 20/0.76 on 09/14/2023.  Total bilirubin 0.4, AST/ALT 27/15 with alkaline phosphatase 99 and lipase 32.    Lactic acid 2.8 and 1.2 respectively.    Urinalysis negative for any acute infection.    Cardiac enzymes 10 and 13 respectively.  Right upper quadrant ultrasound showing no evidence of acute pathology with questionable medullary nephrocalcinosis.  CT of the abdomen/pelvis revealing no evidence of acute pathology involving the abdomen and pelvis.  See radiology report for complete findings.    Patient was given single dose of Phenergan 25 milligram p.o. to help with any residual nausea along with a short-term prescription to be given upon discharge.   No indication for further inpatient workup at this time.    Differential diagnosis includes, but no supporting evidence to indicate acute cholecystitis, acute abdomen, small-bowel obstruction, bowel perforation or any other life-threatening/emergent condition in need admission or immediate surgical intervention at this time.  Follow-up warning signs to return to the ER at once were explained to and acknowledged by the patient/family with no further questions or concerns prior to discharge.    Amount and/or Complexity of Data Reviewed  Labs: ordered. Decision-making details documented in ED Course.  Radiology: ordered. Decision-making details documented in ED Course.     Details: [place] RT UPPER QUADRANT, 01/12/2024 6:01 PM     INDICATION: RUQ abd. pain; abdomen pain  COMPARISON:  No existing relevant imaging and/or the patient did not have previous relevant imaging.     TECHNIQUE: Multiplanar grayscale sonographic images of the right upper quadrant supplemented by color Doppler as needed.     FINDINGS:  Pancreas: Visualized portions  unremarkable.     Liver: The liver is normal in size and contour but mildly echogenic suggesting steatosis. Lesion is small anechoic structure without internal blood flow suggestive of cyst in the right hemiliver measuring 1.4 cm.  Gallbladder: Contracted gallbladder. No gallstones, gallbladder wall thickening, or pericholecystic fluid.  Biliary System: No bilary dilatation. Common duct measures 0.6 cm.     Right Kidney: Kidney measures 10.4 cm. No focal lesion or hydronephrosis. Increased echogenicity of the renal medullary pyramids, nephrolithiasis versus medullary nephrocalcinosis. Recommend correlation with elevated urine calcium.     IMPRESSION:  No [place] evidence of acute pathology.  Question medullary nephrocalcinosis.  Fatty liver disease.    Radiologist location ID: [id number]          CT ABDOMEN PELVIS W IV CONTRAST performed on 01/12/2024 6:07 PM.     INDICATION:  gen. abd. pain x 6 months;Hx of Br. Ca   Additional History:  Diffuse abdominal pain, x 6 months, history of breast cancer, HTN     TECHNIQUE:  CT abdomen and pelvis with axial, coronal, and sagittal multiplanar reformations, performed with contrast. Dose modulation, automated exposure control, and/or iterative reconstruction were used for dose reduction.     CONTRAST:  75 mL Isovue 370 IV     COMPARISON: Ultrasound imaging from earlier today.     FINDINGS:  MSK:  Probable sebaceous cyst within the subcutaneous tissues overlying the left gluteal musculature. The remaining superficial soft tissues are unremarkable. No acute osseous process.     LOWER THORAX:  Mediastinum: Unremarkable.  Lung Bases: Negative for basilar consolidation. Centrilobular emphysema. The presence of pulmonary emphysema on CT is an independent risk factor for lung cancer. It is recommended that this patient with emphysema be evaluated for enrollment in a low dose CT lung cancer screening program.     ABDOMEN/PELVIS:  Vasculature: Calcific aortoiliac plaque without evidence  of aortic aneurysm.  Peritoneum: No significant ascites.     Liver: No focal hepatic lesion.  Spleen: No focal splenic lesion.  Pancreas: No focal lesion or adjacent inflammatory fluid.  Biliary system: No biliary dilatation.     Adrenals: Negative for adrenal nodule.  Kidneys/Ureters: Tiny low-density renal lesions bilaterally, no follow-up imaging recommended. Bilateral nephrolithiasis. No hydronephrosis. No evidence of ureteral calculus.  Bladder:  Unremarkable.  Reproductive System: No acute process.  Retroperitoneum: No acute process. Negative for pathologic or paraaortic lymph node.     Bowel: Normal caliber small and large bowel. No focal bowel wall thickening. Scattered colonic diverticula but without pericolonic stranding. The appendix is within normal limits.  Mesenteries: Negative for mesenteric lymphadenopathy, mass, or organized fluid collection.        IMPRESSION:  No CT evidence of acute pathology involving the abdomen and pelvis. Multiple ancillary findings as above.    Radiologist location ID: [id number]         ECG/medicine tests: ordered. Decision-making details documented in ED Course.     Details: ECG time:14:29  [Sinus] rhythm at a rate of 68 bpm  PR -164 ms  QRS -86 ms  QT -428 ms  QTc -455 ms  Axis -normal   No STEMI  ECG reviewed by Marni Griffon PA-c    Risk  Prescription drug management.      ED Course as of 01/13/24 1155   Fri Jan 12, 2024   1757 Routine labs as well as lipase, lactic acid, urinalysis, cardiac enzymes, EKG, right upper quadrant ultrasound and CT of the abdomen/pelvis with contrast was ordered given patient's medical history and symptoms.  EKG reveals sinus rhythm at a rate of 68 with no ST elevation MI.  Lab results reveal white blood cell count 12.9, hemoglobin/hematocrit 13.2/41.5 with platelet count 238.  Sodium 138, potassium 4.9, CO2 24, BUN/creatinine 28/1.05 with previous 20/0.76 on 09/14/2023.  Total bilirubin 0.4, AST/ALT 27/15 with alkaline phosphatase 99  and lipase 32.    Lactic acid 2.8 and 1.2 respectively.    Urinalysis negative for any acute infection.    Cardiac enzymes 10 and 13 respectively.  Right upper quadrant ultrasound showing no evidence of acute pathology with questionable medullary nephrocalcinosis.  CT of the abdomen/pelvis revealing no evidence of acute pathology involving the abdomen and pelvis.  See radiology report for complete findings.    Patient was given single dose of Phenergan 25 milligram p.o. to help with any residual nausea along with a short-term prescription to be given upon discharge.             Medications Ordered/Administered in the ED   NS bolus infusion 1,000 mL (1,000 mL Intravenous New Bag/New Syringe 01/12/24 1813)   promethazine (PHENERGAN) tablet (has no administration in time range)   iopamidol (ISOVUE-370) 76% infusion (75 mL Intravenous Given 01/12/24 1808)     Clinical Impression   Mild dehydration   Abdominal pain, unspecified abdominal location (Primary)       Disposition: Discharged             Procedures

## 2024-01-12 NOTE — Nurses Notes (Signed)

## 2024-01-12 NOTE — Discharge Instructions (Signed)
 It is very important that you contact your PCP within the next 24-48 hours to inform them of your visit here today, any medications that were prescribed to ensure there are no conflicts with any of your currently prescribed medications and follow-up as directed. Do not drive/operate any machinery, drink alcohol or combine any other sedative medications while taking sedative or pain medications prescribed/given here in the ER.  If you do not have a PCP, please follow-up with the [facility] on your discharge paperwork.  Return to the ER At Once if you experience any NEW or WORSENING of your current symptoms as discussed.

## 2024-01-13 LAB — ECG 12 LEAD
Atrial Rate: 68 {beats}/min
Calculated P Axis: 70 degrees
Calculated R Axis: 45 degrees
Calculated T Axis: 61 degrees
PR Interval: 164 ms
QRS Duration: 86 ms
QT Interval: 428 ms
QTC Calculation: 455 ms
Ventricular rate: 68 {beats}/min

## 2024-01-13 LAB — URINE CULTURE,ROUTINE: URINE CULTURE: NO GROWTH

## 2024-01-23 ENCOUNTER — Ambulatory Visit: Payer: Self-pay | Attending: GENERAL SURGERY | Admitting: GENERAL SURGERY

## 2024-01-23 ENCOUNTER — Other Ambulatory Visit: Payer: Self-pay

## 2024-01-23 ENCOUNTER — Encounter (HOSPITAL_BASED_OUTPATIENT_CLINIC_OR_DEPARTMENT_OTHER): Payer: Self-pay | Admitting: GENERAL SURGERY

## 2024-01-23 VITALS — BP 158/100 | HR 65 | Temp 98.3°F | Resp 16 | Ht 61.0 in | Wt 139.6 lb

## 2024-01-23 DIAGNOSIS — Z9013 Acquired absence of bilateral breasts and nipples: Secondary | ICD-10-CM

## 2024-01-23 DIAGNOSIS — M79601 Pain in right arm: Secondary | ICD-10-CM

## 2024-01-23 DIAGNOSIS — M549 Dorsalgia, unspecified: Secondary | ICD-10-CM

## 2024-01-23 DIAGNOSIS — Z8679 Personal history of other diseases of the circulatory system: Secondary | ICD-10-CM

## 2024-01-23 DIAGNOSIS — M79602 Pain in left arm: Secondary | ICD-10-CM

## 2024-01-23 DIAGNOSIS — K8020 Calculus of gallbladder without cholecystitis without obstruction: Secondary | ICD-10-CM | POA: Insufficient documentation

## 2024-01-23 DIAGNOSIS — Z853 Personal history of malignant neoplasm of breast: Secondary | ICD-10-CM

## 2024-01-23 DIAGNOSIS — M79605 Pain in left leg: Secondary | ICD-10-CM

## 2024-01-23 DIAGNOSIS — R079 Chest pain, unspecified: Secondary | ICD-10-CM | POA: Insufficient documentation

## 2024-01-23 DIAGNOSIS — M79604 Pain in right leg: Secondary | ICD-10-CM

## 2024-01-23 NOTE — Nursing Note (Signed)
 [name] is a 68 year old female here today as a new patient for cholelithiasis. The patient was referred by Jed Miners, MD. The patient had a CT of the chest due to sternal pain which incidentally noted cholelithiasis and nephrolithiasis. The patient had a RUQ ultrasound which showed no gallstones, gallbladder wall thickening, or pericholecystic fluid. The patient also had a CT of the abdomen which showed no evidence of acute pathology involving the abdomen and pelvis.    The patient states her pain is a 10/10 through her entire torso and even her legs. She states that around 6 months ago she has been having sharpe nerve-like pain through her rib cage and around her breast area. She had a double mastectomy 2 years ago at [facility] but didn't have to have oncology treat. 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 She is having nausea and loss of appetite.   The patient denies vomiting, constipation or diarrhea.

## 2024-01-24 ENCOUNTER — Ambulatory Visit (INDEPENDENT_AMBULATORY_CARE_PROVIDER_SITE_OTHER): Payer: Self-pay

## 2024-01-24 ENCOUNTER — Encounter (HOSPITAL_BASED_OUTPATIENT_CLINIC_OR_DEPARTMENT_OTHER): Payer: Self-pay | Admitting: GENERAL SURGERY

## 2024-01-24 ENCOUNTER — Ambulatory Visit (INDEPENDENT_AMBULATORY_CARE_PROVIDER_SITE_OTHER): Payer: Self-pay | Admitting: INTERVENTIONAL CARDIOLOGY

## 2024-01-24 NOTE — H&P (Signed)
 [facility]  [address]  [place] [address]  Operated by [facility]     Name: [name] MRN:  [id number]   Date of Birth: April 25, 1956 Age: 68 y.o.   Date: 01/23/2024       Provider: Nicoletta Dress, MD    PCP: No Pcp    Referring Provider: Casimer Bilis       Reason for visit: New Patient (New patient for cholelithiasis)    WUX:[id number] Notes:   Rachel Comment, LPN  27/25/36 6440  Signed  [name] is a 68 year old female here today as a new patient for cholelithiasis. The patient was referred by Casimer Bilis, MD. The patient had a CT of the chest due to sternal pain which incidentally noted cholelithiasis and nephrolithiasis. The patient had a RUQ ultrasound which showed no gallstones, gallbladder wall thickening, or pericholecystic fluid. The patient also had a CT of the abdomen which showed no evidence of acute pathology involving the abdomen and pelvis.    The patient states her pain is a 10/10 through her entire torso and even her legs. She states that around 6 months ago she has been having sharpe nerve-like pain through her rib cage and around her breast area. She had a double mastectomy 2 years ago at [facility] but didn't have to have oncology treat. 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 She is having nausea and loss of appetite.   The patient denies vomiting, constipation or diarrhea.   Objective:  [name] is a 68 y.o. female presenting with abdominal pain and chest pain..    Past Medical History:  Past Medical History:   Diagnosis Date    Abdominal pain     Cancer (CMS HCC)     Chest pain     Chronic lung disease     Coronary artery disease     Dyslipidemia     Essential hypertension     Heart disease     History of percutaneous coronary intervention     History of stress test     Mixed hyperlipidemia     Unspecified chronic bronchitis      Past Surgical History:   Procedure Laterality Date    CARDIAC  CATHETERIZATION      CORONARY ANGIOPLASTY      CORONARY ARTERY STENT PLACEMENT      HX BACK SURGERY      HX CORONARY ARTERY BYPASS GRAFT      HX MASTECTOMY, SIMPLE Bilateral     HX TUBAL LIGATION      NECK SURGERY        No Known Allergies  Current Outpatient Medications   Medication Sig    anastrozole (ARIMIDEX) 1 mg Oral Tablet Take 1 Tablet (1 mg total) by mouth Daily    aspirin 81 mg Oral Tablet, Chewable Chew 1 Tablet (81 mg total) Once a day    atorvastatin (LIPITOR) 40 mg Oral Tablet Take 1 Tablet (40 mg total) by mouth Daily    cholecalciferol, vitamin D3, 25 mcg (1,000 unit) Oral Tablet Take 1 Tablet (1,000 Units total) by mouth Daily    clonazePAM (KLONOPIN) 0.5 mg Oral Tablet Take 1 Tablet (0.5 mg total) by mouth Twice per day as needed for Other (ANXIETY)    DULoxetine (CYMBALTA DR) 60 mg Oral Capsule, Delayed Release(E.C.) Take 1 Capsule (60 mg total) by mouth Daily    fluticasone propionate (FLONASE) 50 mcg/actuation  Nasal Spray, Suspension 1 Spray Once per day as needed for Other (CONGESTION)    gabapentin (NEURONTIN) 400 mg Oral Capsule Take 1 Capsule (400 mg total) by mouth Daily    losartan (COZAAR) 25 mg Oral Tablet Take 1 Tablet (25 mg total) by mouth Once a day    metoprolol tartrate (LOPRESSOR) 25 mg Oral Tablet Take 1 Tablet (25 mg total) by mouth Twice daily    MV,Ca,Ir,Min-FA-Lut-Lyco-HC153 3-133-33 mg-mcg-mcg Oral Capsule Take 1 Capsule by mouth Daily    omega-3 fatty acid (LOVAZA) 1 gram Oral Capsule Take 1 Capsule (1 g total) by mouth Daily    pantoprazole (PROTONIX) 40 mg Oral Tablet, Delayed Release (E.C.) Take 1 Tablet (40 mg total) by mouth Daily    promethazine (PHENERGAN) 25 mg Oral Tablet Take 1 Tablet (25 mg total) by mouth Every 8 hours as needed for Nausea/Vomiting    sertraline (ZOLOFT) 100 mg Oral Tablet Take 1 Tablet (100 mg total) by mouth Daily    traZODone (DESYREL) 150 mg Oral Tablet Take 1 Tablet (150 mg total) by mouth Every night     Family History       Problem  Relation Age of Onset Comments    Arthritis Maternal Grandmother      Arthritis Mother (Deceased)      Cancer Father (Deceased)      Cancer Other      Heart Attack Father (Deceased)      Heart Attack Other      Heart Disease Brother (Alive)      Heart Disease Other      Hypertension (High Blood Pressure) Father (Deceased)      Hypertension (High Blood Pressure) Mother (Deceased)      Hypertension (High Blood Pressure) Other      Pacemaker Mother (Deceased)             Social History     Socioeconomic History    Marital status: Widowed   Tobacco Use    Smoking status: Every Day     Current packs/day: 0.50     Types: Cigarettes    Smokeless tobacco: Never   Vaping Use    Vaping status: Never Used   Substance and Sexual Activity    Alcohol use: Yes     [name]: occasional    Drug use: Yes     Types: Marijuana     Social Determinants of Health     Social Connections: Low Risk  (02/08/2023)    Social Connections     SDOH Social Isolation: 5 or more times a week       Review of Systems:  The pertinent ROS as addressed in the HPI.    Vital Signs:  BP (!) 158/100 (Site: Left Arm, Patient Position: Sitting, Cuff Size: Adult)   Pulse 65   Temp 36.8 C (98.3 F) (Temporal)   Resp 16   Ht 1.549 m (5\' 1" )   Wt 63.3 kg (139 lb 9.6 oz)   SpO2 100%   BMI 26.38 kg/m           Physical Exam:    GENERAL:  [name] well nourished well developed, alert, in no distress.  [name] oriented to person place and time.  Thought coherent.  Ambulate without difficulty.  Color is normal.  Average body habitus.  SKIN: Warm and dry.  HEAD: Atraumatic, normocephalic.  EYES: Pupils equal and reactive. EOMI.  NECK: Supple, Trachea midline, no lymphadenopathy.  CARDIAC: S1 and S2, no murmurs  or gallops. No S3.  She has a scar well healed in the mid chest post cardiac surgery.  LUNGS: Clear to auscultation bilaterally. No wheezes or rhonchi. No accessory muscle use noted.  Breast examination:  Bilateral mastectomy.  Scars are well healed.     GI: Abdomen soft, nontender. Good bowel sounds x 4. No organomegaly appreciated.  GU: No suprapubic tenderness. No costovertebral tenderness.  MUSCULOSKELETAL: Good muscle strength throughout.  EXTREMITIES: No pedal edema noted. Distal pulses equal bilaterally.  NEUROLOGICAL: Cranial nerves grossly intact. No gross focal deficit.  PSYCHIATRIC: Appropriate mood and affect.     Assessment:    ICD-10-CM    1. Cholelithiasis  K80.20       2. Chest pain, unspecified type  R07.9            Plan:  She presented with multiple complaints.  It is mostly related to chest pain torso.  Chest and back.  She has pain in her upper and lower extremities.  She denied having any GI symptoms no nausea, vomiting.  No food intolerance.  No change in bowel habits.  She tolerate all kind of food without difficulty.    The patient states her pain is a 10/10 through her entire torso and even her legs. She states that around 6 months ago she has been having sharpe nerve-like pain through her rib cage and around her breast area. She had a double mastectomy 2 years ago at [facility] but didn't have to have oncology treat. 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 She is having nausea and loss of appetite.   The patient denies vomiting, constipation or diarrhea.    She had a visit to the emergency room because of her severe chest pain.  She has had heart surgery.  She has been following up with her cardiologist her cardiovascular surgeon and they apparently found no cardiac or pulmonary reason for her pain.  She was referred to the pain clinic.  CT scan of the chest has shown the presence of cholelithiasis and nephrolithiasis.  CT scan of the abdomen showed and reported no gallbladder or biliary changes.  Ultrasound of the gallbladder showed no evidence of gallstones.  We discussed all her complaints and symptoms and we discussed her imaging studies.  Patient was informed that we do not have any evidence of  gallstones at this time.  Her chest pain need to be re-evaluated by her cardiologist and her medical doctors.  New line she will be discharged to return as needed.  Return if symptoms worsen or fail to improve.    Rachel Relic, MD     Portions of this note may be dictated using voice recognition software or a dictation service. Variances in spelling and vocabulary are possible and unintentional. Not all errors are caught/corrected. Please notify the [facility] if any discrepancies are noted or if the meaning of any statement is not clear.

## 2024-01-25 ENCOUNTER — Telehealth (HOSPITAL_BASED_OUTPATIENT_CLINIC_OR_DEPARTMENT_OTHER): Payer: Self-pay | Admitting: Nurse Practitioner

## 2024-01-25 NOTE — Telephone Encounter (Signed)
 Pt calling today regarding pain.     Pt states that she would not like to give me any detail. Pt would like to speak with Jenette Mitchell when available.    [phone]

## 2024-02-21 ENCOUNTER — Ambulatory Visit (INDEPENDENT_AMBULATORY_CARE_PROVIDER_SITE_OTHER): Payer: Self-pay | Admitting: Family

## 2024-03-07 ENCOUNTER — Ambulatory Visit (HOSPITAL_BASED_OUTPATIENT_CLINIC_OR_DEPARTMENT_OTHER): Payer: Self-pay | Admitting: NURSE PRACTITIONER

## 2024-04-07 ENCOUNTER — Encounter: Payer: Self-pay | Admitting: Internal Medicine

## 2024-04-07 DIAGNOSIS — R1310 Dysphagia, unspecified: Secondary | ICD-10-CM

## 2024-04-09 ENCOUNTER — Ambulatory Visit (INDEPENDENT_AMBULATORY_CARE_PROVIDER_SITE_OTHER): Admitting: Internal Medicine

## 2024-04-09 VITALS — BP 110/80 | HR 80 | Temp 98.2°F | Wt 184.9 lb

## 2024-04-09 DIAGNOSIS — R1319 Other dysphagia: Secondary | ICD-10-CM | POA: Diagnosis not present

## 2024-04-09 NOTE — Progress Notes (Signed)
 Established Patient Office Visit     CC/Reason for Visit: Dysphagia  HPI: Judith Thomas is a 68 y.o. female who is coming in today for the above mentioned reasons.  Has been having trouble with food getting stuck in her upper chest area.  This has happened 3 times usually with meat.  Most recent episode was with a grilled chicken sandwich.  She had to drink water and felt like the water was going around the food bolus.  Eventually she was able to clear it.  She has been taking some over-the-counter Nexium that has not provided much relief.  She is taking to semisolid foods for now.   Past Medical/Surgical History: Past Medical History:  Diagnosis Date   Breast cancer (HCC)    right   Family history of bladder cancer    Family history of breast cancer    Knee pain, chronic 10/11/2015    Past Surgical History:  Procedure Laterality Date   BREAST LUMPECTOMY WITH RADIOACTIVE SEED AND SENTINEL LYMPH NODE BIOPSY Right 03/08/2019   Procedure: RIGHT BREAST RADIOACTIVE SEED LUMPECTOMY X2 AND SENTINEL LYMPH NODE BIOPSY;  Surgeon: Curvin Deward MOULD, MD;  Location: [facility];  Service: General;  Laterality: Right;   BREAST REDUCTION SURGERY Left 02/09/2023   Procedure: BREAST REDUCTION WITH LIPOSUCTION;  Surgeon: Lowery Estefana RAMAN, DO;  Location: [facility];  Service: Plastics;  Laterality: Left;   COLONOSCOPY  03/2010   none     PORT-A-CATH REMOVAL Left 12/30/2019   Procedure: REMOVAL PORT-A-CATH;  Surgeon: Curvin Deward MOULD, MD;  Location: [facility];  Service: General;  Laterality: Left;   PORTACATH PLACEMENT Left 11/07/2018   Procedure: INSERTION PORT-A-CATH WITH ULTRASOUND;  Surgeon: Curvin Deward MOULD, MD;  Location:  SURGERY CENTER;  Service: General;  Laterality: Left;    Social History:  reports that she has never smoked. She has never used smokeless tobacco. She reports current alcohol use. She reports that she does not use  drugs.  Allergies: Allergies  Allergen Reactions   Paclitaxel  Other (See Comments)    Nausea, flushing    Family History:  Family History  Problem Relation Age of Onset   Liver disease Mother    Heart disease Mother        afib   Heart disease Father    Asthma Father    Diabetes Father    Bladder Cancer Father        smoker   Breast cancer Maternal Aunt    Breast cancer Maternal Aunt        mets to bone   Heart disease Maternal Grandfather    Kidney disease Paternal Grandmother    Heart disease Paternal Grandfather    Heart disease Other    Colon cancer Neg Hx    Colon polyps Neg Hx    Esophageal cancer Neg Hx    Rectal cancer Neg Hx    Stomach cancer Neg Hx      Current Outpatient Medications:    anastrozole  (ARIMIDEX ) 1 MG tablet, Take 1 tablet (1 mg total) by mouth daily., Disp: 90 tablet, Rfl: 3   Calcium Carbonate-Vit D-Min (CALCIUM 1200 PO), Take by mouth., Disp: , Rfl:    cholecalciferol (VITAMIN D3) 25 MCG (1000 UNIT) tablet, Take 1,000 Units by mouth daily., Disp: , Rfl:    cyanocobalamin  (VITAMIN B12) 1000 MCG tablet, Take 1,000 mcg by mouth daily., Disp: , Rfl:   Review of Systems:  Negative  unless indicated in HPI.   Physical Exam: Vitals:   04/09/24 1118  BP: 110/80  Pulse: 80  Temp: 98.2 F (36.8 C)  TempSrc: Oral  SpO2: 98%  Weight: 184 lb 14.4 oz (83.9 kg)    Body mass index is 30.77 kg/m.   Impression and Plan:  Esophageal dysphagia -     DG ESOPHAGUS W SINGLE CM (SOL OR THIN BA); Future   - Sent for barium swallow, GI referral placed, continue OTC Nexium.  Time spent:30 minutes reviewing chart, interviewing and examining patient and formulating plan of care.     Tully Theophilus Andrews, MD [facility]

## 2024-04-10 ENCOUNTER — Ambulatory Visit (INDEPENDENT_AMBULATORY_CARE_PROVIDER_SITE_OTHER): Payer: Self-pay | Admitting: NURSE PRACTITIONER

## 2024-04-10 ENCOUNTER — Other Ambulatory Visit (INDEPENDENT_AMBULATORY_CARE_PROVIDER_SITE_OTHER): Payer: Self-pay | Admitting: INTERVENTIONAL CARDIOLOGY

## 2024-04-10 ENCOUNTER — Other Ambulatory Visit: Payer: Self-pay

## 2024-04-10 ENCOUNTER — Ambulatory Visit (HOSPITAL_BASED_OUTPATIENT_CLINIC_OR_DEPARTMENT_OTHER): Payer: Self-pay | Admitting: PAIN MANAGEMENT

## 2024-04-10 ENCOUNTER — Ambulatory Visit (INDEPENDENT_AMBULATORY_CARE_PROVIDER_SITE_OTHER): Payer: Self-pay | Admitting: Family

## 2024-04-10 MED ORDER — LOSARTAN 25 MG TABLET
25.0000 mg | ORAL_TABLET | Freq: Every day | ORAL | 3 refills | Status: AC
Start: 2024-04-10 — End: ?
  Filled 2024-04-10 – 2024-07-04 (×6): qty 90, 90d supply, fill #0

## 2024-04-10 MED ORDER — ATORVASTATIN 40 MG TABLET
40.0000 mg | ORAL_TABLET | Freq: Every day | ORAL | 3 refills | Status: AC
Start: 2024-04-10 — End: ?
  Filled 2024-04-10 – 2024-05-21 (×5): qty 90, 90d supply, fill #0

## 2024-04-15 ENCOUNTER — Encounter: Payer: Self-pay | Admitting: Internal Medicine

## 2024-04-16 ENCOUNTER — Other Ambulatory Visit: Payer: Self-pay

## 2024-04-16 ENCOUNTER — Ambulatory Visit (HOSPITAL_BASED_OUTPATIENT_CLINIC_OR_DEPARTMENT_OTHER): Payer: Self-pay | Admitting: PAIN MANAGEMENT

## 2024-04-17 ENCOUNTER — Other Ambulatory Visit: Payer: Self-pay

## 2024-04-18 NOTE — Progress Notes (Unsigned)
 [facility] Initial Consultation   Referring Provider Theophilus Andrews, Tully GRADE, MD [address] [place],  [place] [address]  Primary Care Provider Theophilus Andrews, Tully GRADE, MD  Patient Profile: Judith Thomas is a 69 y.o. female who is seen in consultation in the [facility] at the request of Dr. Theophilus Andrews for evaluation and management of the problem(s) noted below.  Problem List: GERD Dysphagia   History of Present Illness   Judith Thomas is a 68 y.o. female with a history of breast cancer who is referred to the gastroenterology office for evaluation and management of dysphagia.   Last colonoscopy: 04/2021 - 7 mm TC polyp (TA) , left sided diverticula Last endoscopy: ***  Last Abd CT/CTE/MRE: ***  GI Review of Symptoms Significant for {GIROS:[id number]}. Otherwise negative.  General Review of Systems  Review of systems is significant for the pertinent positives and negatives as listed per the HPI.  Full ROS is otherwise negative.  Past Medical History   Past Medical History:  Diagnosis Date   Breast cancer (HCC)    right   Family history of bladder cancer    Family history of breast cancer    Knee pain, chronic 10/11/2015     Past Surgical History   Past Surgical History:  Procedure Laterality Date   BREAST LUMPECTOMY WITH RADIOACTIVE SEED AND SENTINEL LYMPH NODE BIOPSY Right 03/08/2019   Procedure: RIGHT BREAST RADIOACTIVE SEED LUMPECTOMY X2 AND SENTINEL LYMPH NODE BIOPSY;  Surgeon: Curvin Deward MOULD, MD;  Location: [facility];  Service: General;  Laterality: Right;   BREAST REDUCTION SURGERY Left 02/09/2023   Procedure: BREAST REDUCTION WITH LIPOSUCTION;  Surgeon: Lowery Estefana RAMAN, DO;  Location: [facility];  Service: Plastics;  Laterality: Left;   COLONOSCOPY  03/2010   none     PORT-A-CATH REMOVAL Left 12/30/2019   Procedure: REMOVAL PORT-A-CATH;  Surgeon: Curvin Deward MOULD, MD;  Location: [facility];  Service: General;  Laterality: Left;   PORTACATH PLACEMENT Left 11/07/2018   Procedure: INSERTION PORT-A-CATH WITH ULTRASOUND;  Surgeon: Curvin Deward MOULD, MD;  Location: [facility];  Service: General;  Laterality: Left;     Allergies and Medications   Allergies  Allergen Reactions   Paclitaxel  Other (See Comments)    Nausea, flushing    @MEDSTODAY @  Family History   Family History  Problem Relation Age of Onset   Liver disease Mother    Heart disease Mother        afib   Heart disease Father    Asthma Father    Diabetes Father    Bladder Cancer Father        smoker   Breast cancer Maternal Aunt    Breast cancer Maternal Aunt        mets to bone   Heart disease Maternal Grandfather    Kidney disease Paternal Grandmother    Heart disease Paternal Grandfather    Heart disease Other    Colon cancer Neg Hx    Colon polyps Neg Hx    Esophageal cancer Neg Hx    Rectal cancer Neg Hx    Stomach cancer Neg Hx      Social History   Social History   Tobacco Use   Smoking status: Never   Smokeless tobacco: Never  Vaping Use   Vaping status: Never Used  Substance Use Topics   Alcohol use: Yes    Comment: rare use   Drug use:  No   Sharman reports that she has never smoked. She has never used smokeless tobacco. She reports current alcohol use. She reports that she does not use drugs.  Vital Signs and Physical Examination   There were no vitals filed for this visit. There is no height or weight on file to calculate BMI.    General: Well developed, well nourished, no acute distress Head: Normocephalic and atraumatic Eyes: Sclerae anicteric, EOMI Ears: Normal auditory acuity Mouth: No deformities or lesions noted Lungs: Clear throughout to auscultation Heart: Regular rate and rhythm; No murmurs, rubs or bruits Abdomen: Soft, non tender and non distended. No masses, hepatosplenomegaly or hernias noted. Normal Bowel  sounds Rectal: Musculoskeletal: Symmetrical with no gross deformities  Pulses:  Normal pulses noted Extremities: No edema or deformities noted Neurological: Alert oriented x 4, grossly nonfocal Psychological:  Alert and cooperative. Normal mood and affect  Review of Data  The following data was reviewed at the time of this encounter:  Laboratory Studies      Latest Ref Rng & Units 08/15/2023    8:24 AM 08/11/2022   10:58 AM 08/25/2021   11:12 AM  CBC  WBC 4.0 - 10.5 K/uL 4.9  5.8  4.8   Hemoglobin 12.0 - 15.0 g/dL 87.2  86.6  87.1   Hematocrit 36.0 - 46.0 % 39.1  40.0  39.2   Platelets 150.0 - 400.0 K/uL 255.0  259.0  267.0     No results found for: LIPASE    Latest Ref Rng & Units 08/15/2023    8:24 AM 08/11/2022   10:58 AM 08/24/2021    8:25 AM  CMP  Glucose 70 - 99 mg/dL 94  92  96   BUN 6 - 23 mg/dL 12  9  13    Creatinine 0.40 - 1.20 mg/dL 9.21  9.26  9.27   Sodium 135 - 145 mEq/L 141  140  139   Potassium 3.5 - 5.1 mEq/L 3.9  4.9  4.2   Chloride 96 - 112 mEq/L 103  103  102   CO2 19 - 32 mEq/L 31  30  30    Calcium 8.4 - 10.5 mg/dL 9.5  9.9  9.5   Total Protein 6.0 - 8.3 g/dL 7.4  7.7  7.6   Total Bilirubin 0.2 - 1.2 mg/dL 0.8  0.7  0.7   Alkaline Phos 39 - 117 U/L 119  107  143   AST 0 - 37 U/L 15  15  15    ALT 0 - 35 U/L 10  9  12       Imaging Studies    GI Procedures and Studies  Colonoscopy 04/2021 7 mm TC polyp (TA) , left sided diverticula   Clinical Impression  It is my clinical impression that Judith Thomas is a 68 y.o. female with;  ***  Plan  *** *** *** *** ***  Planned Follow Up No follow-ups on file.  The patient or caregiver verbalized understanding of the material covered, with no barriers to understanding. All questions were answered. Patient or caregiver is agreeable with the plan outlined above.    It was a pleasure to see Judith Thomas.  If you have any questions or concerns regarding this evaluation, do not hesitate to contact  me.  Inocente Hausen, MD [facility]

## 2024-04-19 ENCOUNTER — Encounter: Payer: Self-pay | Admitting: Pediatrics

## 2024-04-19 ENCOUNTER — Ambulatory Visit: Admitting: Pediatrics

## 2024-04-19 VITALS — BP 134/78 | HR 79 | Ht 65.0 in | Wt 184.0 lb

## 2024-04-19 DIAGNOSIS — Z1211 Encounter for screening for malignant neoplasm of colon: Secondary | ICD-10-CM

## 2024-04-19 DIAGNOSIS — Z8719 Personal history of other diseases of the digestive system: Secondary | ICD-10-CM | POA: Diagnosis not present

## 2024-04-19 DIAGNOSIS — Z860101 Personal history of adenomatous and serrated colon polyps: Secondary | ICD-10-CM

## 2024-04-19 DIAGNOSIS — R1310 Dysphagia, unspecified: Secondary | ICD-10-CM

## 2024-04-19 NOTE — Patient Instructions (Signed)
 You have been scheduled for a Barium Esophogram at [facility] (1st floor of the hospital) on 05/06/2024 at 11:00 AM. Please arrive 30 minutes prior to your appointment for registration.  If you need to reschedule for any reason, please contact radiology at [phone] to do so. __________________________________________________________________ A barium swallow is an examination that concentrates on views of the esophagus. This tends to be a double contrast exam (barium and two liquids which, when combined, create a gas to distend the wall of the oesophagus) or single contrast (non-ionic iodine based). The study is usually tailored to your symptoms so a good history is essential. Attention is paid during the study to the form, structure and configuration of the esophagus, looking for functional disorders (such as aspiration, dysphagia, achalasia, motility and reflux) EXAMINATION You may be asked to change into a gown, depending on the type of swallow being performed. A radiologist and radiographer will perform the procedure. The radiologist will advise you of the type of contrast selected for your procedure and direct you during the exam. You will be asked to stand, sit or lie in several different positions and to hold a small amount of fluid in your mouth before being asked to swallow while the imaging is performed .In some instances you may be asked to swallow barium coated marshmallows to assess the motility of a solid food bolus. The exam can be recorded as a digital or video fluoroscopy procedure. POST PROCEDURE It will take 1-2 days for the barium to pass through your system. To facilitate this, it is important, unless otherwise directed, to increase your fluids for the next 24-48hrs and to resume your normal diet.  This test typically takes about 30 minutes to perform. __________________________________________________________________________________   Judith Thomas have been scheduled for an  endoscopy. Please follow written instructions given to you at your visit today.  If you use inhalers (even only as needed), please bring them with you on the day of your procedure.  If you take any of the following medications, they will need to be adjusted prior to your procedure:   DO NOT TAKE 7 DAYS PRIOR TO TEST- Trulicity (dulaglutide) Ozempic, Wegovy (semaglutide) Mounjaro (tirzepatide) Bydureon Bcise (exanatide extended release)  DO NOT TAKE 1 DAY PRIOR TO YOUR TEST Rybelsus (semaglutide) Adlyxin (lixisenatide) Victoza (liraglutide) Byetta (exanatide) ___________________________________________________________________________   Thank you for entrusting me with your care and for choosing [facility], Dr. Inocente Hausen  _______________________________________________________  If your blood pressure at your visit was 140/90 or greater, please contact your primary care physician to follow up on this.  _______________________________________________________  If you are age 67 or older, your body mass index should be between 23-30. Your Body mass index is 30.62 kg/m. If this is out of the aforementioned range listed, please consider follow up with your Primary Care Provider.  If you are age 67 or younger, your body mass index should be between 19-25. Your Body mass index is 30.62 kg/m. If this is out of the aformentioned range listed, please consider follow up with your Primary Care Provider.   ________________________________________________________  The [facility] providers would like to encourage you to use MYCHART to communicate with providers for non-urgent requests or questions.  Due to long hold times on the telephone, sending your provider a message by [facility] may be a faster and more efficient way to get a response.  Please allow 48 business hours for a response.  Please remember that this is for non-urgent requests.   _______________________________________________________

## 2024-04-20 ENCOUNTER — Encounter: Payer: Self-pay | Admitting: Pediatrics

## 2024-04-29 ENCOUNTER — Other Ambulatory Visit: Payer: Self-pay

## 2024-05-06 ENCOUNTER — Ambulatory Visit (HOSPITAL_COMMUNITY)
Admission: RE | Admit: 2024-05-06 | Discharge: 2024-05-06 | Disposition: A | Discharge status: Home / Self Care | Source: Ambulatory Visit | Attending: Pediatrics | Admitting: Pediatrics

## 2024-05-06 ENCOUNTER — Other Ambulatory Visit: Payer: Self-pay | Admitting: Pediatrics

## 2024-05-06 ENCOUNTER — Ambulatory Visit: Payer: Self-pay | Admitting: Pediatrics

## 2024-05-06 DIAGNOSIS — Z1211 Encounter for screening for malignant neoplasm of colon: Secondary | ICD-10-CM

## 2024-05-06 DIAGNOSIS — K224 Dyskinesia of esophagus: Secondary | ICD-10-CM | POA: Diagnosis not present

## 2024-05-06 DIAGNOSIS — R1310 Dysphagia, unspecified: Secondary | ICD-10-CM | POA: Insufficient documentation

## 2024-05-06 DIAGNOSIS — K449 Diaphragmatic hernia without obstruction or gangrene: Secondary | ICD-10-CM | POA: Diagnosis not present

## 2024-05-06 DIAGNOSIS — Z4682 Encounter for fitting and adjustment of non-vascular catheter: Secondary | ICD-10-CM | POA: Diagnosis not present

## 2024-05-07 ENCOUNTER — Encounter: Payer: Self-pay | Admitting: Pediatrics

## 2024-05-14 ENCOUNTER — Other Ambulatory Visit: Payer: Self-pay

## 2024-05-14 NOTE — Progress Notes (Unsigned)
 [facility] History and Physical   Primary Care Physician:  Theophilus Andrews, Tully GRADE, MD   Reason for Procedure:  Dysphagia  Plan:    Upper endoscopy, possible esophageal dilation     HPI: Judith Thomas is a 68 y.o. female undergoing upper endoscopy for investigation of dysphagia.  Patient reports 3 episodes of solid food dysphagia 1 of which sounded consistent with significant food impaction with meat.  No dysphagia to pills or liquids.  No history of GERD.  Barium esophagram showed esophageal dysmotility and small hiatal hernia-no evidence of stricture.  Denies epigastric abdominal pain, history of atopy or neck masses.   Past Medical History:  Diagnosis Date   Breast cancer (HCC)    right   Family history of bladder cancer    Family history of breast cancer    Knee pain, chronic 10/11/2015    Past Surgical History:  Procedure Laterality Date   BREAST LUMPECTOMY WITH RADIOACTIVE SEED AND SENTINEL LYMPH NODE BIOPSY Right 03/08/2019   Procedure: RIGHT BREAST RADIOACTIVE SEED LUMPECTOMY X2 AND SENTINEL LYMPH NODE BIOPSY;  Surgeon: Curvin Deward MOULD, MD;  Location: [facility];  Service: General;  Laterality: Right;   BREAST REDUCTION SURGERY Left 02/09/2023   Procedure: BREAST REDUCTION WITH LIPOSUCTION;  Surgeon: Lowery Estefana RAMAN, DO;  Location: [facility];  Service: Plastics;  Laterality: Left;   COLONOSCOPY  03/2010   none     PORT-A-CATH REMOVAL Left 12/30/2019   Procedure: REMOVAL PORT-A-CATH;  Surgeon: Curvin Deward MOULD, MD;  Location: [facility];  Service: General;  Laterality: Left;   PORTACATH PLACEMENT Left 11/07/2018   Procedure: INSERTION PORT-A-CATH WITH ULTRASOUND;  Surgeon: Curvin Deward MOULD, MD;  Location: [facility];  Service: General;  Laterality: Left;    Prior to Admission medications   Medication Sig Start Date End Date Taking? Authorizing Provider  anastrozole  (ARIMIDEX ) 1 MG tablet Take 1  tablet (1 mg total) by mouth daily. 07/18/23   Gudena, Vinay, MD  Calcium Carbonate-Vit D-Min (CALCIUM 1200 PO) Take by mouth.    [provider]  cholecalciferol (VITAMIN D3) 25 MCG (1000 UNIT) tablet Take 1,000 Units by mouth daily.    [provider]  cyanocobalamin  (VITAMIN B12) 1000 MCG tablet Take 1,000 mcg by mouth daily.    [provider]    Current Outpatient Medications  Medication Sig Dispense Refill   anastrozole  (ARIMIDEX ) 1 MG tablet Take 1 tablet (1 mg total) by mouth daily. 90 tablet 3   Calcium Carbonate-Vit D-Min (CALCIUM 1200 PO) Take by mouth.     cholecalciferol (VITAMIN D3) 25 MCG (1000 UNIT) tablet Take 1,000 Units by mouth daily.     cyanocobalamin  (VITAMIN B12) 1000 MCG tablet Take 1,000 mcg by mouth daily.     No current facility-administered medications for this visit.    Allergies as of 05/15/2024 - Review Complete 04/20/2024  Allergen Reaction Noted   Paclitaxel  Other (See Comments) 11/27/2018    Family History  Problem Relation Age of Onset   Liver disease Mother    Heart disease Mother        afib   Heart disease Father    Asthma Father    Diabetes Father    Bladder Cancer Father        smoker   Breast cancer Maternal Aunt    Breast cancer Maternal Aunt        mets to bone   Heart disease Maternal Grandfather  Kidney disease Paternal Grandmother    Heart disease Paternal Grandfather    Heart disease Other    Colon cancer Neg Hx    Colon polyps Neg Hx    Esophageal cancer Neg Hx    Rectal cancer Neg Hx    Stomach cancer Neg Hx     Social History   Socioeconomic History   Marital status: Married    Spouse name: michael   Number of children: 2   Years of education: Not on file   Highest education level: Some college, no degree  Occupational History   Not on file  Tobacco Use   Smoking status: Never   Smokeless tobacco: Never  Vaping Use   Vaping status: Never Used  Substance and Sexual Activity    Alcohol use: Yes    Comment: rare use   Drug use: No   Sexual activity: Not on file  Other Topics Concern   Not on file  Social History Narrative   ** Merged History Encounter **       Social Drivers of Health   Financial Resource Strain: Low Risk  (04/09/2024)   Overall Financial Resource Strain (CARDIA)    Difficulty of Paying Living Expenses: Not hard at all  Food Insecurity: No Food Insecurity (04/09/2024)   Hunger Vital Sign    Worried About Running Out of Food in the Last Year: Never true    Ran Out of Food in the Last Year: Never true  Transportation Needs: No Transportation Needs (04/09/2024)   PRAPARE - Administrator, Civil Service (Medical): No    Lack of Transportation (Non-Medical): No  Physical Activity: Insufficiently Active (04/09/2024)   Exercise Vital Sign    Days of Exercise per Week: 3 days    Minutes of Exercise per Session: 20 min  Stress: No Stress Concern Present (04/09/2024)   [employer] of Occupational Health - Occupational Stress Questionnaire    Feeling of Stress: Not at all  Social Connections: Moderately Integrated (04/09/2024)   Social Connection and Isolation Panel    Frequency of Communication with Friends and Family: More than three times a week    Frequency of Social Gatherings with Friends and Family: Twice a week    Attends Religious Services: Patient declined    Database administrator or Organizations: Yes    Attends Engineer, structural: More than 4 times per year    Marital Status: Married  Catering manager Violence: Not At Risk (08/14/2023)   Humiliation, Afraid, Rape, and Kick questionnaire    Fear of Current or Ex-Partner: No    Emotionally Abused: No    Physically Abused: No    Sexually Abused: No    Review of Systems:  All other review of systems negative except as mentioned in the HPI.  Physical Exam: Vital signs There were no vitals taken for this visit.  General:   Alert,  Well-developed,  well-nourished, pleasant and cooperative in NAD Airway:  Mallampati  Lungs:  Clear throughout to auscultation.   Heart:  Regular rate and rhythm; no murmurs, clicks, rubs,  or gallops. Abdomen:  Soft, nontender and nondistended. Normal bowel sounds.   Neuro/Psych:  Normal mood and affect. A and O x 3  Inocente Hausen, MD [facility]

## 2024-05-15 ENCOUNTER — Encounter: Payer: Self-pay | Admitting: Pediatrics

## 2024-05-15 ENCOUNTER — Ambulatory Visit: Admitting: Pediatrics

## 2024-05-15 VITALS — BP 134/65 | HR 74 | Temp 97.0°F | Resp 14 | Ht 65.0 in | Wt 184.0 lb

## 2024-05-15 DIAGNOSIS — R1310 Dysphagia, unspecified: Secondary | ICD-10-CM

## 2024-05-15 DIAGNOSIS — K449 Diaphragmatic hernia without obstruction or gangrene: Secondary | ICD-10-CM | POA: Diagnosis not present

## 2024-05-15 DIAGNOSIS — K222 Esophageal obstruction: Secondary | ICD-10-CM | POA: Diagnosis not present

## 2024-05-15 MED ORDER — SODIUM CHLORIDE 0.9 % IV SOLN: 500.0000 mL | Freq: Once | INTRAVENOUS | Status: DC

## 2024-05-15 NOTE — Progress Notes (Signed)
 Patient to call for follow-up appointment with  Dr. Suzann after checking her schedule. B.Garnett Rekowski RN.

## 2024-05-15 NOTE — Op Note (Addendum)
 [facility] Patient Name: Judith Thomas Procedure Date: 05/15/2024 9:40 AM MRN: [id number] Endoscopist: Inocente Hausen , MD, [id number] Age: 68 Referring MD:  Date of Birth: 07/22/1956 Gender: Female Account #: [id number] Procedure:                Upper GI endoscopy Indications:              Dysphagia, barium esophagram showing esophageal                            dysmotility and hiatal hernia Medicines:                Monitored Anesthesia Care Procedure:                Pre-Anesthesia Assessment:                           - Prior to the procedure, a History and Physical                            was performed, and patient medications and                            allergies were reviewed. The patient's tolerance of                            previous anesthesia was also reviewed. The risks                            and benefits of the procedure and the sedation                            options and risks were discussed with the patient.                            All questions were answered, and informed consent                            was obtained. Prior Anticoagulants: The patient has                            taken no anticoagulant or antiplatelet agents. ASA                            Grade Assessment: II - A patient with mild systemic                            disease. After reviewing the risks and benefits,                            the patient was deemed in satisfactory condition to                            undergo the procedure.  After obtaining informed consent, the endoscope was                            passed under direct vision. Throughout the                            procedure, the patient's blood pressure, pulse, and                            oxygen saturations were monitored continuously. The                            Olympus Scope [id number] was introduced through the                            mouth, and advanced to the  second part of duodenum.                            The upper GI endoscopy was accomplished without                            difficulty. The patient tolerated the procedure                            well. Scope In: Scope Out: Findings:                 Normal mucosa was found in the entire esophagus.                            Biopsies were obtained from the proximal and distal                            esophagus with cold forceps for histology to                            evaluate for eosinophilic esophagitis.                           A non-obstructing and mild Schatzki ring was found                            at the gastroesophageal junction. A TTS dilator was                            passed through the scope. Dilation with a                            15-16.5-18 mm balloon dilator was performed to 18                            mm.                           The gastric body, gastric antrum, cardia (  on                            retroflexion) and gastric fundus (on retroflexion)                            were normal.                           A medium-sized hiatal hernia was present.                           The duodenal bulb and second portion of the                            duodenum were normal. Complications:            No immediate complications. Estimated blood loss:                            Minimal. Estimated Blood Loss:     Estimated blood loss was minimal. Impression:               - Normal mucosa was found in the entire esophagus.                           - Non-obstructing and mild Schatzki ring. Dilated.                           - Normal gastric body, antrum, cardia and gastric                            fundus.                           - Medium-sized hiatal hernia.                           - Normal duodenal bulb and second portion of the                            duodenum.                           - Biopsies were taken with a cold forceps for                             evaluation of eosinophilic esophagitis.                           - Hiatal hernia and/or Schatzki's ring could be                            contributing to symptoms of dysphagia. Biopsies                            performed to rule out EOE. Recommendation:           -  Discharge patient to home (ambulatory).                           - Await pathology results.                           - The findings and recommendations were discussed                            with the patient's family.                           - Return to GI clinic in 2-3 months with Dr.                            Suzann or APP.                           - Patient has a contact number available for                            emergencies. The signs and symptoms of potential                            delayed complications were discussed with the                            patient. Return to normal activities tomorrow.                            Written discharge instructions were provided to the                            patient. Inocente Suzann, MD 05/15/2024 10:19:06 AM This report has been signed electronically. Addendum Number: 1   Addendum Date: 05/15/2024 10:19:51 AM      Cold biopsy forceps were also used to disrupt Schatzki's ring with 4       pieces of tissue removed using the forceps. Inocente Suzann, MD 05/15/2024 10:20:27 AM This report has been signed electronically.

## 2024-05-15 NOTE — Patient Instructions (Signed)
 Please read handouts provided. Await pathology results. Return to GI clinic in 2-3 months with Dr. Suzann or APP.   YOU HAD AN ENDOSCOPIC PROCEDURE TODAY AT THE [facility]:   Refer to the procedure report that was given to you for any specific questions about what was found during the examination.  If the procedure report does not answer your questions, please call your gastroenterologist to clarify.  If you requested that your care partner not be given the details of your procedure findings, then the procedure report has been included in a sealed envelope for you to review at your convenience later.  YOU SHOULD EXPECT: Some feelings of bloating in the abdomen. Passage of more gas than usual.  Walking can help get rid of the air that was put into your GI tract during the procedure and reduce the bloating. If you had a lower endoscopy (such as a colonoscopy or flexible sigmoidoscopy) you may notice spotting of blood in your stool or on the toilet paper. If you underwent a bowel prep for your procedure, you may not have a normal bowel movement for a few days.  Please Note:  You might notice some irritation and congestion in your nose or some drainage.  This is from the oxygen used during your procedure.  There is no need for concern and it should clear up in a day or so.  SYMPTOMS TO REPORT IMMEDIATELY:  Following upper endoscopy (EGD)  Vomiting of blood or coffee ground material  New chest pain or pain under the shoulder blades  Painful or persistently difficult swallowing  New shortness of breath  Fever of 100F or higher  Black, tarry-looking stools  For urgent or emergent issues, a gastroenterologist can be reached at any hour by calling [phone]. Do not use MyChart messaging for urgent concerns.    DIET:  We do recommend a small meal at first, but then you may proceed to your regular diet.  Drink plenty of fluids but you should avoid alcoholic beverages for 24  hours.  ACTIVITY:  You should plan to take it easy for the rest of today and you should NOT DRIVE or use heavy machinery until tomorrow (because of the sedation medicines used during the test).    FOLLOW UP: Our staff will call the number listed on your records the next business day following your procedure.  We will call around 7:15- 8:00 am to check on you and address any questions or concerns that you may have regarding the information given to you following your procedure. If we do not reach you, we will leave a message.     If any biopsies were taken you will be contacted by phone or by letter within the next 1-3 weeks.  Please call us  at [phone] if you have not heard about the biopsies in 3 weeks.    SIGNATURES/CONFIDENTIALITY: You and/or your care partner have signed paperwork which will be entered into your electronic medical record.  These signatures attest to the fact that that the information above on your After Visit Summary has been reviewed and is understood.  Full responsibility of the confidentiality of this discharge information lies with you and/or your care-partner.

## 2024-05-15 NOTE — Progress Notes (Signed)
 Called to room to assist during endoscopic procedure.  Patient ID and intended procedure confirmed with present staff. Received instructions for my participation in the procedure from the performing physician.

## 2024-05-15 NOTE — Progress Notes (Signed)
 Vss nad trans to pacu

## 2024-05-15 NOTE — Progress Notes (Signed)
 Pt's states no medical or surgical changes since previsit or office visit.

## 2024-05-16 ENCOUNTER — Telehealth: Payer: Self-pay | Admitting: *Deleted

## 2024-05-16 NOTE — Telephone Encounter (Signed)
  Follow up Call-     05/15/2024    8:56 AM  Call back number  Post procedure Call Back phone  # [phone]  Permission to leave phone message Yes   Left message on machine to call back if any questions or concerns

## 2024-05-17 LAB — SURGICAL PATHOLOGY

## 2024-05-20 ENCOUNTER — Ambulatory Visit: Payer: Self-pay | Admitting: Pediatrics

## 2024-05-21 ENCOUNTER — Other Ambulatory Visit: Payer: Self-pay

## 2024-05-24 ENCOUNTER — Ambulatory Visit (INDEPENDENT_AMBULATORY_CARE_PROVIDER_SITE_OTHER): Payer: Self-pay | Admitting: INTERNAL MEDICINE

## 2024-05-28 ENCOUNTER — Encounter: Payer: Self-pay | Admitting: Pediatrics

## 2024-06-05 DIAGNOSIS — H5203 Hypermetropia, bilateral: Secondary | ICD-10-CM | POA: Diagnosis not present

## 2024-06-11 ENCOUNTER — Other Ambulatory Visit: Payer: Self-pay

## 2024-06-13 ENCOUNTER — Other Ambulatory Visit (HOSPITAL_COMMUNITY): Payer: Self-pay

## 2024-06-13 DIAGNOSIS — R109 Unspecified abdominal pain: Secondary | ICD-10-CM

## 2024-06-14 ENCOUNTER — Observation Stay
Admission: EM | Admit: 2024-06-14 | Discharge: 2024-06-17 | DRG: 181 | Disposition: A | Discharge status: Home / Self Care | Source: Home / Self Care | Attending: INTERNAL MEDICINE

## 2024-06-14 ENCOUNTER — Inpatient Hospital Stay (HOSPITAL_COMMUNITY)

## 2024-06-14 ENCOUNTER — Emergency Department (HOSPITAL_COMMUNITY)

## 2024-06-14 ENCOUNTER — Other Ambulatory Visit: Payer: Self-pay

## 2024-06-14 ENCOUNTER — Encounter (HOSPITAL_COMMUNITY): Payer: Self-pay

## 2024-06-14 DIAGNOSIS — Z79899 Other long term (current) drug therapy: Secondary | ICD-10-CM

## 2024-06-14 DIAGNOSIS — H6693 Otitis media, unspecified, bilateral: Secondary | ICD-10-CM

## 2024-06-14 DIAGNOSIS — C787 Secondary malignant neoplasm of liver and intrahepatic bile duct: Secondary | ICD-10-CM | POA: Diagnosis present

## 2024-06-14 DIAGNOSIS — R6889 Other general symptoms and signs: Secondary | ICD-10-CM | POA: Diagnosis present

## 2024-06-14 DIAGNOSIS — K219 Gastro-esophageal reflux disease without esophagitis: Secondary | ICD-10-CM | POA: Diagnosis present

## 2024-06-14 DIAGNOSIS — R0600 Dyspnea, unspecified: Secondary | ICD-10-CM

## 2024-06-14 DIAGNOSIS — C771 Secondary and unspecified malignant neoplasm of intrathoracic lymph nodes: Secondary | ICD-10-CM | POA: Diagnosis present

## 2024-06-14 DIAGNOSIS — C50919 Malignant neoplasm of unspecified site of unspecified female breast: Secondary | ICD-10-CM | POA: Diagnosis present

## 2024-06-14 DIAGNOSIS — R918 Other nonspecific abnormal finding of lung field: Secondary | ICD-10-CM | POA: Diagnosis present

## 2024-06-14 DIAGNOSIS — K838 Other specified diseases of biliary tract: Secondary | ICD-10-CM

## 2024-06-14 DIAGNOSIS — J410 Simple chronic bronchitis: Secondary | ICD-10-CM | POA: Diagnosis present

## 2024-06-14 DIAGNOSIS — E785 Hyperlipidemia, unspecified: Secondary | ICD-10-CM | POA: Diagnosis present

## 2024-06-14 DIAGNOSIS — R112 Nausea with vomiting, unspecified: Secondary | ICD-10-CM

## 2024-06-14 DIAGNOSIS — I480 Paroxysmal atrial fibrillation: Secondary | ICD-10-CM | POA: Diagnosis present

## 2024-06-14 DIAGNOSIS — J4489 Other specified chronic obstructive pulmonary disease: Secondary | ICD-10-CM | POA: Diagnosis present

## 2024-06-14 DIAGNOSIS — C3431 Malignant neoplasm of lower lobe, right bronchus or lung: Principal | ICD-10-CM | POA: Diagnosis present

## 2024-06-14 DIAGNOSIS — Z9013 Acquired absence of bilateral breasts and nipples: Secondary | ICD-10-CM

## 2024-06-14 DIAGNOSIS — I2510 Atherosclerotic heart disease of native coronary artery without angina pectoris: Secondary | ICD-10-CM | POA: Diagnosis present

## 2024-06-14 DIAGNOSIS — E782 Mixed hyperlipidemia: Secondary | ICD-10-CM | POA: Diagnosis present

## 2024-06-14 DIAGNOSIS — I10 Essential (primary) hypertension: Secondary | ICD-10-CM | POA: Diagnosis present

## 2024-06-14 DIAGNOSIS — R531 Weakness: Secondary | ICD-10-CM

## 2024-06-14 DIAGNOSIS — R1011 Right upper quadrant pain: Secondary | ICD-10-CM | POA: Diagnosis present

## 2024-06-14 DIAGNOSIS — Z955 Presence of coronary angioplasty implant and graft: Secondary | ICD-10-CM

## 2024-06-14 DIAGNOSIS — Z853 Personal history of malignant neoplasm of breast: Secondary | ICD-10-CM | POA: Diagnosis present

## 2024-06-14 DIAGNOSIS — R5381 Other malaise: Principal | ICD-10-CM | POA: Diagnosis present

## 2024-06-14 DIAGNOSIS — R590 Localized enlarged lymph nodes: Secondary | ICD-10-CM | POA: Insufficient documentation

## 2024-06-14 DIAGNOSIS — F1290 Cannabis use, unspecified, uncomplicated: Secondary | ICD-10-CM | POA: Diagnosis present

## 2024-06-14 DIAGNOSIS — Z9010 Acquired absence of unspecified breast and nipple: Secondary | ICD-10-CM | POA: Diagnosis present

## 2024-06-14 DIAGNOSIS — Z7982 Long term (current) use of aspirin: Secondary | ICD-10-CM

## 2024-06-14 DIAGNOSIS — F17210 Nicotine dependence, cigarettes, uncomplicated: Secondary | ICD-10-CM

## 2024-06-14 DIAGNOSIS — Z951 Presence of aortocoronary bypass graft: Secondary | ICD-10-CM | POA: Diagnosis present

## 2024-06-14 LAB — CBC WITH DIFF
BASOPHIL #: 0.11 x10ˆ3/uL (ref <=0.20)
BASOPHIL %: 1.1 %
EOSINOPHIL #: 0.16 x10ˆ3/uL (ref <=0.50)
EOSINOPHIL %: 1.5 %
HCT: 39.3 % (ref 34.8–46.0)
HGB: 12.3 g/dL (ref 11.5–16.0)
IMMATURE GRANULOCYTE #: 0.14 x10ˆ3/uL — ABNORMAL HIGH (ref <0.10)
IMMATURE GRANULOCYTE %: 1.3 % — ABNORMAL HIGH (ref 0.0–1.0)
LYMPHOCYTE #: 2.42 x10ˆ3/uL (ref 1.00–4.80)
LYMPHOCYTE %: 23.2 %
MCH: 28.5 pg (ref 26.0–32.0)
MCHC: 31.3 g/dL (ref 31.0–35.5)
MCV: 91.2 fL (ref 78.0–100.0)
MONOCYTE #: 0.69 x10ˆ3/uL (ref 0.20–1.10)
MONOCYTE %: 6.6 %
MPV: 9.9 fL (ref 8.7–12.5)
NEUTROPHIL #: 6.89 x10ˆ3/uL (ref 1.50–7.70)
NEUTROPHIL %: 66.3 %
PLATELETS: 207 x10ˆ3/uL (ref 150–400)
RBC: 4.31 x10ˆ6/uL (ref 3.85–5.22)
RDW-CV: 12.8 % (ref 11.5–15.5)
WBC: 10.4 x10ˆ3/uL (ref 3.7–11.0)

## 2024-06-14 LAB — COMPREHENSIVE METABOLIC PANEL, NON-FASTING
ALBUMIN: 3.6 g/dL (ref 3.5–5.2)
ALKALINE PHOSPHATASE: 102 U/L (ref 35–129)
ALT (SGPT): 12 U/L (ref 0–33)
ANION GAP: 12 mmol/L (ref 7–18)
AST (SGOT): 36 U/L — ABNORMAL HIGH (ref 0–32)
BILIRUBIN TOTAL: 0.2 mg/dL (ref 0.2–1.2)
BUN: 18 mg/dL (ref 8–23)
CALCIUM: 10 mg/dL (ref 8.3–10.7)
CHLORIDE: 105 mmol/L (ref 96–106)
CO2 TOTAL: 23 mmol/L (ref 22–30)
CREATININE: 0.85 mg/dL (ref 0.50–0.90)
ESTIMATED GFR: 75 mL/min/1.73mˆ2 — ABNORMAL LOW (ref >90)
GLUCOSE: 107 mg/dL (ref 74–109)
POTASSIUM: 4.9 mmol/L (ref 3.2–5.0)
PROTEIN TOTAL: 7 g/dL (ref 6.4–8.3)
SODIUM: 140 mmol/L (ref 133–144)

## 2024-06-14 LAB — URINALYSIS, MACRO/MICRO
BACTERIA: 217.9 /HPF — ABNORMAL HIGH (ref <=0)
BILIRUBIN: NEGATIVE mg/dL
BLOOD: NEGATIVE mg/dL
GLUCOSE: NEGATIVE mg/dL
KETONES: NEGATIVE mg/dL
LEUKOCYTES: NEGATIVE WBCs/uL
NITRITE: NEGATIVE
PH: 5.5 (ref 5.0–7.0)
RBCS: <2 /HPF (ref <=3.0)
SPECIFIC GRAVITY: 1.018 (ref 1.005–1.025)
SQUAMOUS EPITHELIAL CELLS: 5.3 /HPF (ref 0.0–10.0)
TOTAL CASTS: 1.6 /LPF (ref <=2.0)
UROBILINOGEN: 1 mg/dL
WBCS: 2.3 /HPF (ref <=4.0)

## 2024-06-14 LAB — SHORT RESPIRATORY PANEL
HMPV: NEGATIVE
INFLUENZA A RNA: NEGATIVE
INFLUENZA B RNA: NEGATIVE
RSV: NEGATIVE
SARS-CoV-2: NEGATIVE

## 2024-06-14 LAB — LACTIC ACID LEVEL W/ REFLEX FOR LEVEL >2.0: LACTIC ACID: 1 mmol/L (ref 0.5–2.0)

## 2024-06-14 LAB — STREPTOCOCCUS PYOGENES, PCR: STREPTOCOCCUS PYOGENES, PCR: NEGATIVE

## 2024-06-14 MED ORDER — PANTOPRAZOLE 40 MG TABLET,DELAYED RELEASE
40.0000 mg | DELAYED_RELEASE_TABLET | Freq: Every day | ORAL | Status: DC
Start: 2024-06-15 — End: 2024-06-17
  Administered 2024-06-15 – 2024-06-17 (×3): 40 mg via ORAL
  Filled 2024-06-14 (×3): qty 1

## 2024-06-14 MED ORDER — IOPAMIDOL 370 MG IODINE/ML (76 %) INTRAVENOUS SOLUTION
75.0000 mL | INTRAVENOUS | Status: AC
Start: 2024-06-14 — End: 2024-06-14
  Administered 2024-06-14: 75 mL via INTRAVENOUS

## 2024-06-14 MED ORDER — MORPHINE 4 MG/ML INJECTION WRAPPER
3.0000 mg | INJECTION | INTRAMUSCULAR | Status: DC | PRN
Start: 2024-06-14 — End: 2024-06-17
  Administered 2024-06-14 – 2024-06-17 (×3): 3 mg via INTRAVENOUS
  Filled 2024-06-14 (×3): qty 1

## 2024-06-14 MED ORDER — ATORVASTATIN 40 MG TABLET
40.0000 mg | ORAL_TABLET | Freq: Every day | ORAL | Status: DC
Start: 2024-06-15 — End: 2024-06-17
  Administered 2024-06-15 – 2024-06-17 (×3): 40 mg via ORAL
  Filled 2024-06-14 (×3): qty 1

## 2024-06-14 MED ORDER — OMEGA-3 ACID ETHYL ESTERS 1 GRAM CAPSULE
1.0000 g | ORAL_CAPSULE | Freq: Every day | ORAL | Status: DC
Start: 2024-06-15 — End: 2024-06-17
  Administered 2024-06-15 – 2024-06-17 (×3): 1 g via ORAL
  Filled 2024-06-14 (×3): qty 1

## 2024-06-14 MED ORDER — ONDANSETRON HCL (PF) 4 MG/2 ML INJECTION SOLUTION
4.0000 mg | Freq: Three times a day (TID) | INTRAMUSCULAR | Status: DC | PRN
Start: 2024-06-14 — End: 2024-06-17
  Administered 2024-06-14 – 2024-06-17 (×4): 4 mg via INTRAVENOUS
  Filled 2024-06-14 (×4): qty 2

## 2024-06-14 MED ORDER — ACETAMINOPHEN 325 MG TABLET
650.0000 mg | ORAL_TABLET | ORAL | Status: AC | PRN
Start: 2024-06-14 — End: ?

## 2024-06-14 MED ORDER — SERTRALINE 50 MG TABLET
100.0000 mg | ORAL_TABLET | Freq: Every day | ORAL | Status: DC
Start: 2024-06-15 — End: 2024-06-17
  Administered 2024-06-15 – 2024-06-17 (×3): 100 mg via ORAL
  Filled 2024-06-14 (×3): qty 2

## 2024-06-14 MED ORDER — DOCUSATE SODIUM 100 MG CAPSULE
100.0000 mg | ORAL_CAPSULE | Freq: Two times a day (BID) | ORAL | Status: DC | PRN
Start: 2024-06-14 — End: 2024-06-17

## 2024-06-14 MED ORDER — CHOLECALCIFEROL (VITAMIN D3) 25 MCG (1,000 UNIT) TABLET
1000.0000 [IU] | ORAL_TABLET | Freq: Every day | ORAL | Status: DC
Start: 2024-06-15 — End: 2024-06-17
  Administered 2024-06-15 – 2024-06-17 (×3): 1000 [IU] via ORAL
  Filled 2024-06-14 (×3): qty 1

## 2024-06-14 MED ORDER — ASPIRIN 81 MG CHEWABLE TABLET
81.0000 mg | CHEWABLE_TABLET | Freq: Every day | ORAL | Status: DC
Start: 2024-06-15 — End: 2024-06-17
  Administered 2024-06-15 – 2024-06-16 (×2): 81 mg via ORAL
  Filled 2024-06-14 (×2): qty 1

## 2024-06-14 MED ORDER — ONDANSETRON 4 MG DISINTEGRATING TABLET
4.0000 mg | ORAL_TABLET | ORAL | Status: AC
Start: 2024-06-14 — End: 2024-06-14
  Administered 2024-06-14: 4 mg via ORAL
  Filled 2024-06-14: qty 1

## 2024-06-14 MED ORDER — CLONAZEPAM 0.5 MG TABLET
0.5000 mg | ORAL_TABLET | Freq: Two times a day (BID) | ORAL | Status: DC | PRN
Start: 2024-06-14 — End: 2024-06-17
  Administered 2024-06-16 – 2024-06-17 (×2): 0.5 mg via ORAL
  Filled 2024-06-14 (×2): qty 1

## 2024-06-14 MED ORDER — METOPROLOL TARTRATE 25 MG TABLET
25.0000 mg | ORAL_TABLET | Freq: Two times a day (BID) | ORAL | Status: DC
Start: 2024-06-14 — End: 2024-06-17
  Administered 2024-06-14 – 2024-06-17 (×6): 25 mg via ORAL
  Filled 2024-06-14 (×6): qty 1

## 2024-06-14 MED ORDER — CLONAZEPAM 0.5 MG TABLET
0.5000 mg | ORAL_TABLET | ORAL | Status: DC
Start: 2024-06-14 — End: 2024-06-14
  Administered 2024-06-14: 0 mg via ORAL
  Filled 2024-06-14: qty 1

## 2024-06-14 MED ORDER — LOSARTAN 25 MG TABLET
25.0000 mg | ORAL_TABLET | Freq: Every day | ORAL | Status: DC
Start: 2024-06-15 — End: 2024-06-17
  Administered 2024-06-15 – 2024-06-17 (×3): 25 mg via ORAL
  Filled 2024-06-14 (×3): qty 1

## 2024-06-14 MED ORDER — HEPARIN (PORCINE) 5,000 UNIT/ML INJECTION SOLUTION
5000.0000 [IU] | Freq: Three times a day (TID) | INTRAMUSCULAR | Status: DC
Start: 2024-06-14 — End: 2024-06-17
  Administered 2024-06-14 – 2024-06-16 (×5): 5000 [IU] via SUBCUTANEOUS
  Filled 2024-06-14 (×6): qty 1

## 2024-06-14 MED ORDER — TRAZODONE 50 MG TABLET
150.0000 mg | ORAL_TABLET | Freq: Every evening | ORAL | Status: DC
Start: 2024-06-14 — End: 2024-06-17
  Administered 2024-06-14 – 2024-06-16 (×3): 150 mg via ORAL
  Filled 2024-06-14 (×3): qty 1

## 2024-06-14 MED ORDER — GABAPENTIN 400 MG CAPSULE
400.0000 mg | ORAL_CAPSULE | Freq: Every day | ORAL | Status: DC
Start: 2024-06-15 — End: 2024-06-17
  Administered 2024-06-15 – 2024-06-17 (×3): 400 mg via ORAL
  Filled 2024-06-14 (×3): qty 1

## 2024-06-14 NOTE — Assessment & Plan Note (Signed)
 Continue atorvastatin 

## 2024-06-14 NOTE — H&P (Signed)
 [facility]   Admission H&P    Kristy, [place], 68 y.o. female  Date of Admission:  06/14/2024  Date of Birth:  [birth year]    Information Obtained from: patient  Chief Complaint:     Flank pain    History of Present Illness:  Adithi Gammon is a 68 y.o. female who presents  to the ED with complaints of ear pain and flank pain radiating to the abdomen and chest.  Pain is described as sharp.  Reports pain radiates to abdomen and chest.  With associated headaches  , generalized weakness and loss of appetite.  Denies nausea, vomiting or abdominal pain.   On presentation to the ED vital signs within normal limits.  Laboratory workup essentially unremarkable.  CT  chest with right lower lobe mass and right adenopathy suspicious for malignancy    Past Medical History:   Diagnosis Date    Abdominal pain     Cancer (CMS HCC)     Chest pain     Chronic lung disease     Coronary artery disease     Dyslipidemia     Essential hypertension     Heart disease     History of percutaneous coronary intervention     History of stress test     Mixed hyperlipidemia     Unspecified chronic bronchitis        Past Surgical History:   Procedure Laterality Date    CARDIAC CATHETERIZATION      CORONARY ANGIOPLASTY      CORONARY ARTERY STENT PLACEMENT      HX BACK SURGERY      HX CORONARY ARTERY BYPASS GRAFT      HX MASTECTOMY, SIMPLE Bilateral     HX TUBAL LIGATION      NECK SURGERY           Medications Prior to Admission       Prescriptions    anastrozole  (ARIMIDEX ) 1 mg Oral Tablet    Take 1 Tablet (1 mg total) by mouth Daily    aspirin  81 mg Oral Tablet, Chewable    Chew 1 Tablet (81 mg total) Once a day    atorvastatin  (LIPITOR) 40 mg Oral Tablet    Take 1 Tablet (40 mg total) by mouth Daily    cholecalciferol , vitamin D3, 25 mcg (1,000 unit) Oral Tablet    Take 1 Tablet (1,000 Units total) by mouth Daily    clonazePAM  (KLONOPIN ) 0.5 mg Oral Tablet    Take 1 Tablet (0.5 mg total) by mouth Twice per day as needed for  Other (ANXIETY)    DULoxetine  (CYMBALTA  DR) 60 mg Oral Capsule, Delayed Release(E.C.)    Take 1 Capsule (60 mg total) by mouth Daily    fluticasone  propionate (FLONASE ) 50 mcg/actuation Nasal Spray, Suspension    1 Spray Once per day as needed for Other (CONGESTION)    gabapentin  (NEURONTIN ) 400 mg Oral Capsule    Take 1 Capsule (400 mg total) by mouth Daily    losartan  (COZAAR ) 25 mg Oral Tablet    Take 1 Tablet (25 mg total) by mouth Daily    metoprolol  tartrate (LOPRESSOR ) 25 mg Oral Tablet    Take 1 Tablet (25 mg total) by mouth Twice daily    MV,Ca,Ir,Min-FA-Lut-Lyco-HC153 3-133-33 mg-mcg-mcg Oral Capsule    Take 1 Capsule by mouth Daily    omega-3 fatty acid (LOVAZA ) 1 gram Oral Capsule    Take 1 Capsule (1 g total) by  mouth Daily    pantoprazole  (PROTONIX ) 40 mg Oral Tablet, Delayed Release (E.C.)    Take 1 Tablet (40 mg total) by mouth Daily    promethazine  (PHENERGAN ) 25 mg Oral Tablet    Take 1 Tablet (25 mg total) by mouth Every 8 hours as needed for Nausea/Vomiting    sertraline  (ZOLOFT ) 100 mg Oral Tablet    Take 1 Tablet (100 mg total) by mouth Daily    traZODone  (DESYREL ) 150 mg Oral Tablet    Take 1 Tablet (150 mg total) by mouth Every night          Allergies[1]  Social History     Socioeconomic History    Marital status: Widowed   Tobacco Use    Smoking status: Every Day     Current packs/day: 0.50     Types: Cigarettes    Smokeless tobacco: Never   Vaping Use    Vaping status: Never Used   Substance and Sexual Activity    Alcohol use: Yes     Comment: occasional    Drug use: Yes     Types: Marijuana     Social Determinants of Health     Social Connections: Low Risk  (06/14/2024)    Social Connections     SDOH Social Isolation: 5 or more times a week     Family Medical History:       Problem Relation (Age of Onset)    Arthritis Mother, Maternal Grandmother    Cancer Father, Other    Heart Attack Father, Other    Heart Disease Brother, Other    Hypertension (High Blood Pressure) Mother, Father, Other     Pacemaker Mother             Review of Systems:  All systems:  I have reviewed all the systems there are no additional pertinent findings except as stated in the history of present illness.     Vital Signs:  Temperature: 36.7 C (98.1 F)  Heart Rate: 87  BP (Non-Invasive): 139/85  Respiratory Rate: 20  SpO2: 92 %      Physical Exam:  General:  Age-appropriate, not in acute distress   HEENT:  Normocephalic, atraumatic, nares patent  Eyes:  Benign conjunctiva, anicteric sclera, intact lid and eye movements.  Cardiovascular:  Regular rate and rhythm, no murmur or rub appreciated.  No JVD.  Pulmonary:  Clear to auscultation bilaterally, no wheeze, rhonchi or crackles appreciated.  Nonlabored breathing  Gastrointestinal:  Abdomen is soft, nontender, and nondistended; positive bowel sounds.  Skin:  Warm, dry, capillary refill is less than 3 seconds; no rash or ulceration.  Neurologic: MAE; no focal deficits.  Psychiatric:  Alert and oriented x3, cooperative        LABS:  I have reviewed all lab results.  Lab Results Today:    Results for orders placed or performed during the hospital encounter of 06/14/24 (from the past 24 hours)   COMPREHENSIVE METABOLIC PANEL, NON-FASTING   Result Value Ref Range    SODIUM 140 133 - 144 mmol/L    POTASSIUM 4.9 3.2 - 5.0 mmol/L    CHLORIDE 105 96 - 106 mmol/L    CO2 TOTAL 23 22 - 30 mmol/L    ANION GAP 12 7 - 18 mmol/L    BUN 18 8 - 23 mg/dL    CREATININE 9.14 9.49 - 0.90 mg/dL    ESTIMATED GFR 75 (L) >90 mL/min/1.26m^2    ALBUMIN 3.6 3.5 - 5.2 g/dL  CALCIUM 10.0 8.3 - 10.7 mg/dL    GLUCOSE 892 74 - 890 mg/dL    ALKALINE PHOSPHATASE 102 35 - 129 U/L    ALT (SGPT) 12 0 - 33 U/L    AST (SGOT) 36 (H) 0 - 32 U/L    BILIRUBIN TOTAL 0.2 0.2 - 1.2 mg/dL    PROTEIN TOTAL 7.0 6.4 - 8.3 g/dL   LACTIC ACID LEVEL W/ REFLEX FOR LEVEL >2.0   Result Value Ref Range    LACTIC ACID 1.0 0.5 - 2.0 mmol/L   CBC WITH DIFF   Result Value Ref Range    WBC 10.4 3.7 - 11.0 x10^3/uL    RBC 4.31 3.85 - 5.22  x10^6/uL    HGB 12.3 11.5 - 16.0 g/dL    HCT 60.6 65.1 - 53.9 %    MCV 91.2 78.0 - 100.0 fL    MCH 28.5 26.0 - 32.0 pg    MCHC 31.3 31.0 - 35.5 g/dL    RDW-CV 87.1 88.4 - 84.4 %    PLATELETS 207 150 - 400 x10^3/uL    MPV 9.9 8.7 - 12.5 fL    NEUTROPHIL % 66.3 %    LYMPHOCYTE % 23.2 %    MONOCYTE % 6.6 %    EOSINOPHIL % 1.5 %    BASOPHIL % 1.1 %    NEUTROPHIL # 6.89 1.50 - 7.70 x10^3/uL    LYMPHOCYTE # 2.42 1.00 - 4.80 x10^3/uL    MONOCYTE # 0.69 0.20 - 1.10 x10^3/uL    EOSINOPHIL # 0.16 <=0.50 x10^3/uL    BASOPHIL # 0.11 <=0.20 x10^3/uL    IMMATURE GRANULOCYTE % 1.3 (H) 0.0 - 1.0 %    IMMATURE GRANULOCYTE # 0.14 (H) <0.10 x10^3/uL   STREPTOCOCCUS PYOGENES, PCR    Specimen: Swab   Result Value Ref Range    STREPTOCOCCUS PYOGENES, PCR Negative Negative   URINALYSIS, MACRO/MICRO   Result Value Ref Range    COLOR Yellow Yellow, Dark Yellow, Light Yellow    APPEARANCE Clear Clear, Slightly Cloudy    PH 5.5 5.0 - 7.0    SPECIFIC GRAVITY 1.018 1.005 - 1.025    PROTEIN Trace (A) Negative mg/dL    NITRITE Negative Negative    GLUCOSE Negative Negative mg/dL    KETONES Negative Negative mg/dL    BLOOD Negative Negative, Non-Hemolyzed Trace mg/dL    BILIRUBIN Negative Negative mg/dL    UROBILINOGEN 1.0 0.2 , 1.0 mg/dL    LEUKOCYTES Negative Negative WBCs/uL    RBCS <2.0 <=3.0 /hpf    WBCS 2.3 <=4.0 /hpf    SQUAMOUS EPITHELIAL CELLS 5.3 0.0 - 10.0 /hpf    TOTAL CASTS 1.6 <=2.0 /lpf    BACTERIA 217.9 (H) <=0 /hpf   SHORT RESPIRATORY PANEL   Result Value Ref Range    SARS-CoV-2 Negative Negative, Invalid    INFLUENZA A RNA Negative Negative    INFLUENZA B RNA Negative Negative    RSV Negative Negative    HMPV Negative Negative       Radiology Results: No results found.     Immunization History   Administered Date(s) Administered    Covid-19 Vaccine,Moderna Bivalent 11/24/2021    Covid-19 Vaccine,Moderna,12 Years+ 01/18/2020, 02/22/2020, 10/31/2020       DNR Status:  Prior    ASSESSMENT/Plan:  Assessment & Plan  Lung mass    Presented with nonspecific symptoms.    During workup CT chest with soft tissue thickening in right lower lobe concerning for malignancy.  Also with  right lower cervical, supraclavicular, mediastinal and right hilar adenopathy compatible with malignancy.    Consult Oncology and pulmonology for further recommendations.  Coronary artery disease   Continue metoprolol , aspirin  and atorvastatin   Hypertension    Blood pressure acceptable.    Continue metoprolol  and losartan .    Monitor blood pressure and adjust as needed.  Hyperlipidemia    Continue atorvastatin .  Paroxysmal atrial fibrillation (CMS HCC)    Rate controlled.  Continue metoprolol .    Not on anticoagulation.  Follows up outpatient with Cardiology.  History of breast cancer    S/p double mastectomy at [facility]       DVT/PE Prophylaxis: Heparin        Full code    Clarann Helvey Casilda Ogren, MD,         [1] No Known Allergies

## 2024-06-14 NOTE — Assessment & Plan Note (Signed)
 Presented with nonspecific symptoms.    During workup CT chest with soft tissue thickening in right lower lobe concerning for malignancy.  Also with right lower cervical, supraclavicular, mediastinal and right hilar adenopathy compatible with malignancy.    Consult Oncology and pulmonology for further recommendations.

## 2024-06-14 NOTE — ED Nurses Note (Signed)
 Report called to [facility] RN on 4P.

## 2024-06-14 NOTE — ED Nurses Note (Signed)
 Report was given to Ragena Packer RN and pt was transported to [facility]

## 2024-06-14 NOTE — ED Provider Notes (Signed)
 [facility] - Emergency Department  Emergency Department Physician Note      Physician:  Dr. Prentice Search  Facility:  [facility] - Emergency Department  Name: [name]  Age:  68 y.o.  Gender: female      Arrival: Ambulance    Chief Complaint from Triage Staff:    Chief Complaint   Patient presents with    Generalized Body Aches     Pt arrives to ED via [facility]. Pt had an ear infection a couple weeks ago. Pt c/o body aches, cough and sore throat since yestereday. Pt also c/o pain in her ears. Pt also c/o generalized ABD pain pain. Pt c/o N/V.     Vomiting    Abdominal Pain    Nausea       History of Present Illness   History of Present Illness  [name] is a 68 year old female who presents with ear pain and flank pain radiating to the abdomen and chest.    Two months ago, she experienced an illness that has not resolved. Two days ago, she developed an ear infection and was prescribed antibiotics. She now has ear pain on both sides.    She describes flank pain that radiates to the abdomen and up her chest. The pain is sharp, lasts about five minutes, and she notes it does not feel like a heart attack, as she has had open heart surgery before and knows what heart pain feels like.    She reports a headache that started yesterday, causing significant discomfort to the point of crying.    She feels weak and has difficulty getting out of bed, with her legs feeling weak and requiring several minutes to stand up.  Once up has not felt terribly weak today just generally unwell.    She has experienced a loss of appetite. Denies nausea or vomiting but later states she feels her stomach is 'sick'.    She recalls a previous CT scan that suggested gallstones, but subsequent imaging, including an ultrasound and another CT scan, showed no evidence of gallstones.    She is currently on multiple medications including psychiatric medications.    She reports a little shortness of breath but  denies any chest complaints. She does not feel like she has the flu or COVID, despite being swabbed for these.    Physical Exam   ED Triage Vitals [06/14/24 1128]   BP (Non-Invasive) (!) 143/96   Heart Rate 84   Respiratory Rate 18   Temperature 36.4 C (97.6 F)   SpO2 96 %   Weight 59 kg (130 lb)   Height 1.549 m (5' 1)     Physical Exam  Vitals reviewed.   Constitutional:       Appearance: She is not diaphoretic.   HENT:      Head: Normocephalic and atraumatic.      Right Ear: Tympanic membrane normal.      Left Ear: Tympanic membrane normal.      Nose: Nose normal.      Mouth/Throat:      Mouth: Mucous membranes are moist.      Pharynx: Oropharynx is clear.   Eyes:      Extraocular Movements: Extraocular movements intact.      Conjunctiva/sclera: Conjunctivae normal.   Cardiovascular:      Rate and Rhythm: Normal rate and regular rhythm.      Heart sounds: Normal heart sounds. No murmur heard.  No friction rub. No gallop.   Pulmonary:      Effort: Pulmonary effort is normal. No respiratory distress.      Breath sounds: Normal breath sounds.   Abdominal:      General: Bowel sounds are normal. There is no distension.      Palpations: Abdomen is soft.      Tenderness: There is abdominal tenderness (RUQ).   Musculoskeletal:         General: No tenderness. Normal range of motion.      Cervical back: Normal range of motion and neck supple.   Skin:     General: Skin is warm and dry.      Capillary Refill: Capillary refill takes less than 2 seconds.   Neurological:      General: No focal deficit present.      Mental Status: She is alert. Mental status is at baseline.   Psychiatric:         Mood and Affect: Mood normal.         Behavior: Behavior normal.       Patient Data   Labs Ordered/Reviewed   COMPREHENSIVE METABOLIC PANEL, NON-FASTING - Abnormal; Notable for the following components:       Result Value    ESTIMATED GFR 75 (*)     AST (SGOT) 36 (*)     All other components within normal limits   CBC WITH DIFF -  Abnormal; Notable for the following components:    IMMATURE GRANULOCYTE % 1.3 (*)     IMMATURE GRANULOCYTE # 0.14 (*)     All other components within normal limits   URINALYSIS, MACRO/MICRO - Abnormal; Notable for the following components:    PROTEIN Trace (*)     BACTERIA 217.9 (*)     All other components within normal limits   STREPTOCOCCUS PYOGENES, PCR - Normal   LACTIC ACID LEVEL W/ REFLEX FOR LEVEL >2.0 - Normal   SHORT RESPIRATORY PANEL - Normal   URINE CULTURE,ROUTINE   CBC/DIFF    Narrative:     The following orders were created for panel order CBC/DIFF.  Procedure                               Abnormality         Status                     ---------                               -----------         ------                     CBC WITH DIFF[[id number]]                Abnormal            Final result                 Please view results for these tests on the individual orders.   URINALYSIS WITH REFLEX MICROSCOPIC AND CULTURE IF POSITIVE    Narrative:     The following orders were created for panel order URINALYSIS WITH REFLEX MICROSCOPIC AND CULTURE IF POSITIVE.  Procedure  Abnormality         Status                     ---------                               -----------         ------                     URINALYSIS, MACRO/MICRO[[id number]]      Abnormal            Final result                 Please view results for these tests on the individual orders.     CT CHEST ABDOMEN PELVIS W IV CONTRAST   Final Result by Edi, Radresults In (09/05 1554)      Chest:   1. Right lower cervical, supraclavicular, mediastinal and right hilar adenopathy compatible with malignancy.   2. New expansile soft tissue thickening along the bronchovascular bundles of the right lower lobe, considered malignancy until otherwise proven.   3. Emphysema.      Abdomen/pelvis:   1. New subcentimeter hypoechoic hepatic lesions, presumably metastatic.   2. Additional chronic and incidental findings as above.       PET/CT may be of diagnostic benefit.                  Radiologist location ID: [id number]         CT BRAIN WO IV CONTRAST   Final Result by Edi, Radresults In (09/05 1458)      No evidence of acute intracranial disease.      Findings compatible with chronic small vessel ischemic changes.            Note that in acute stroke, findings may not be visible on CT for the first 48 hours.          Radiologist location ID: [id number]         US  GALLBLADDER   Final Result by Edi, Radresults In (09/05 1410)   Unremarkable gallbladder.   Mild chronic common bile duct dilation.         Radiologist location ID: [id number]         XR CHEST AP   Final Result by [id number] Edi, Radiant Critical (09/05 1359)   Abnormal      Right hilar enlargement may represent mass, adenopathy or vascular enlargement. Enhanced CT chest would be the test of choice for further evaluation.         A Significant Orange actionable finding has been sent via the [employer] Actionable Findings application on 06/14/2024 1:55 PM, Message ID [id number]. Receipt of this communication will be communicated to [facility] RADIOLOGY STAFF or responsible provider and will be documented in [employer] Actionable Findings System upon receiving the acknowledgement.         Radiologist location ID: [id number]           Medical Decision Making          Medical Decision Making  The patient is a 68 year old female with a complex presentation including generalized malaise, bilateral ear pain, right upper quadrant abdominal pain, and systemic symptoms. She arrived via EMS reporting progressive symptoms over the past two months, now with acute worsening over the last 48 hours. She described bilateral ear pain, a headache severe enough to induce crying,  abdominal pain radiating from the flanks to the chest, subjective weakness, and poor appetite. She denies fever, acute chest pain, or overt respiratory symptoms. Of note, she has a history of open heart surgery, which she uses  as a reference to distinguish her current discomfort from cardiac-type pain. Her mental status was at baseline and neurologically intact on examination. Vitals were stable at triage, without evidence of hypoxia or hemodynamic instability.    Initial evaluation included a comprehensive metabolic panel and CBC, which showed trace proteinuria, elevated immature granulocytes, and a mildly elevated AST, though without any overt signs of acute infection or organ failure. Urinalysis showed bacteria but no pyuria or nitrites, suggesting asymptomatic bacteriuria rather than a urinary tract infection. Respiratory viral panel and strep testing were negative. Her lactate was normal, arguing against sepsis or significant metabolic derangement. An abdominal ultrasound was negative for acute gallbladder pathology, though it did note mild chronic biliary dilation. Her chest X-ray was concerning for a right hilar mass, prompting advanced cross-sectional imaging.    CT of the chest, abdomen, and pelvis with IV contrast revealed findings consistent with malignancy, including right hilar and mediastinal lymphadenopathy, a suspicious soft tissue mass in the right lower lobe, and new subcentimeter hypoechoic liver lesions concerning for metastases. Additional incidental findings included emphysema. A non-contrast CT of the brain showed chronic small vessel ischemic changes with no evidence of acute stroke or hemorrhage.    The differential diagnosis for this presentation included community-acquired pneumonia, cholecystitis, pyelonephritis, otitis media, and systemic malignancy. Pneumonia was ruled out with clear lung sounds and a normal respiratory panel. Cholecystitis and biliary colic were excluded via unremarkable abdominal ultrasound. Pyelonephritis was ruled out based on the absence of systemic signs of infection, normal creatinine, and non-supportive urinalysis. Otitis media was not supported by normal otoscopic findings. 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    This is a high-complexity encounter based on the evaluation and management of a new, undiagnosed malignancy with systemic symptoms, need for advanced diagnostic imaging, and significant coordination of care. Independent interpretation of imaging was performed and discussed with the patient. Risk is further elevated by the use of IV contrast and the initiation of oncologic workup, including inpatient admission. The patient received supportive care in the ED, including IV fluids and antiemetics.    All findings and concerns, including the high likelihood of a new malignancy, were discussed at length with the patient. Her questions were answered, and she was agreeable to the proposed plan, including admission for further oncologic workup and management.       Amount and/or Complexity of Data Reviewed  Labs: ordered.  Radiology: ordered. Decision-making details documented in ED Course.  ECG/medicine tests: independent interpretation performed.    Risk  Prescription drug management.  Decision regarding hospitalization.      ED Course as of 06/14/24 1751   Fri Jun 14, 2024   1328 CBC/DIFF(!)  No emergent findings   1328 COMPREHENSIVE METABOLIC PANEL, NON-FASTING(!)  Metabolic panel reviewed no emergent findings   1328 URINALYSIS WITH REFLEX MICROSCOPIC AND CULTURE IF POSITIVE(!)  Urinalysis shows bacteria of 217 but no leukocytes, no white cells in total count, and no nitrite positivity.  Asymptomatic bacteriuria.  Trace protein noted.   1328 STREPTOCOCCUS PYOGENES, PCR: Negative   1328 US  GALLBLADDER  Gallbladder ultrasound ordered and pending given right upper  quadrant pain present on examination with associated nausea and vomiting   1342 SHORT RESPIRATORY PANEL  Respiratory panel negative  for flu COVID RSV and human metapneumovirus   1421 XR CHEST AP(!)  Chest x-ray shows right hilar mass.  Added CT brain, chest, abdomen pelvis imaging.   1421 US  GALLBLADDER  No evidence of gallbladder disease   1638 CT CHEST ABDOMEN PELVIS W IV CONTRAST  CT imaging was reviewed showing findings of right hilar mass with metastatic disease likely to the liver.  Patient notified of results.   1650 Broke news the patient regarding concerning cancer findings on CT imaging.  We will admit her for further workup and evaluation of this concerning finding            Medications Ordered/Administered in the ED   clonazePAM  (klonoPIN ) tablet (has no administration in time range)   ondansetron  (ZOFRAN  ODT) rapid dissolve tablet (4 mg Oral Given 06/14/24 1358)   iopamidol  (ISOVUE -370) 76% infusion (75 mL Intravenous Given 06/14/24 1456)     Clinical Impression   Malaise (Primary)   Feeling unwell   RUQ pain   Lung mass   Metastatic cancer to liver (CMS [facility])       Disposition: Admitted           Patient was advised to follow-up on their care with their primary care provider upon discharge.  If the patient does not have a PCP they were told they can call the hospital to request a new patient appointment.  Urgent incidental findings of today's visit were discussed with the patient but it was recommended they go through any imaging and lab reports with their doctor(s) for any issues they may need followed over time. The patient and family, if present, were given an opportunity to ask questions and all questions were answered to the best of my ability.      Part of this note may have been generated using voice recognition software.  Be advised it is possible the generated note may be prone to syntax and other dictation software errors.  Any deviations should be read in the context of the visit.

## 2024-06-14 NOTE — Assessment & Plan Note (Signed)
 S/p double mastectomy at [facility]

## 2024-06-14 NOTE — ED Nurses Note (Signed)
 Xray calls critical finding of R hilar enlargement, may represent mass, adenopathy or vascular enlargement. Enhanced CT chest recommended. Provider notified.

## 2024-06-14 NOTE — Assessment & Plan Note (Signed)
 Rate controlled.  Continue metoprolol .    Not on anticoagulation.  Follows up outpatient with Cardiology.

## 2024-06-14 NOTE — Assessment & Plan Note (Signed)
 Blood pressure acceptable.    Continue metoprolol  and losartan .    Monitor blood pressure and adjust as needed.

## 2024-06-15 DIAGNOSIS — R1011 Right upper quadrant pain: Secondary | ICD-10-CM | POA: Diagnosis present

## 2024-06-15 DIAGNOSIS — R5381 Other malaise: Principal | ICD-10-CM | POA: Diagnosis present

## 2024-06-15 DIAGNOSIS — Z7982 Long term (current) use of aspirin: Secondary | ICD-10-CM

## 2024-06-15 DIAGNOSIS — J410 Simple chronic bronchitis: Secondary | ICD-10-CM | POA: Diagnosis present

## 2024-06-15 DIAGNOSIS — Z87891 Personal history of nicotine dependence: Secondary | ICD-10-CM

## 2024-06-15 DIAGNOSIS — R6889 Other general symptoms and signs: Secondary | ICD-10-CM | POA: Diagnosis present

## 2024-06-15 DIAGNOSIS — C787 Secondary malignant neoplasm of liver and intrahepatic bile duct: Secondary | ICD-10-CM | POA: Diagnosis present

## 2024-06-15 DIAGNOSIS — Z951 Presence of aortocoronary bypass graft: Secondary | ICD-10-CM

## 2024-06-15 LAB — CBC WITH DIFF
BASOPHIL #: <0.1 x10ˆ3/uL (ref <=0.20)
BASOPHIL %: 0.9 %
EOSINOPHIL #: 0.16 x10ˆ3/uL (ref <=0.50)
EOSINOPHIL %: 2.2 %
HCT: 38.1 % (ref 34.8–46.0)
HGB: 11.4 g/dL — ABNORMAL LOW (ref 11.5–16.0)
IMMATURE GRANULOCYTE #: <0.1 x10ˆ3/uL (ref <0.10)
IMMATURE GRANULOCYTE %: 1.1 % — ABNORMAL HIGH (ref 0.0–1.0)
LYMPHOCYTE #: 1.74 x10ˆ3/uL (ref 1.00–4.80)
LYMPHOCYTE %: 23.5 %
MCH: 28.1 pg (ref 26.0–32.0)
MCHC: 29.9 g/dL — ABNORMAL LOW (ref 31.0–35.5)
MCV: 93.8 fL (ref 78.0–100.0)
MONOCYTE #: 0.54 x10ˆ3/uL (ref 0.20–1.10)
MONOCYTE %: 7.3 %
MPV: 10 fL (ref 8.7–12.5)
NEUTROPHIL #: 4.83 x10ˆ3/uL (ref 1.50–7.70)
NEUTROPHIL %: 65 %
PLATELETS: 195 x10ˆ3/uL (ref 150–400)
RBC: 4.06 x10ˆ6/uL (ref 3.85–5.22)
RDW-CV: 12.8 % (ref 11.5–15.5)
WBC: 7.4 x10ˆ3/uL (ref 3.7–11.0)

## 2024-06-15 LAB — URINE CULTURE,ROUTINE: URINE CULTURE: NO GROWTH

## 2024-06-15 LAB — BASIC METABOLIC PANEL
ANION GAP: 12 mmol/L (ref 7–18)
BUN/CREA RATIO: 22
BUN: 13 mg/dL (ref 8–23)
CALCIUM: 9.4 mg/dL (ref 8.3–10.7)
CHLORIDE: 106 mmol/L (ref 96–106)
CO2 TOTAL: 23 mmol/L (ref 22–30)
CREATININE: 0.6 mg/dL (ref 0.50–0.90)
ESTIMATED GFR: >90 mL/min/1.73mˆ2 (ref >90)
GLUCOSE: 99 mg/dL (ref 74–109)
POTASSIUM: 4.3 mmol/L (ref 3.2–5.0)
SODIUM: 141 mmol/L (ref 133–144)

## 2024-06-15 LAB — MAGNESIUM: MAGNESIUM: 1.6 mg/dL (ref 1.6–2.4)

## 2024-06-15 LAB — CA 125: CA 125: 7.5 U/mL (ref 0.0–38.1)

## 2024-06-15 MED ORDER — HYDROCODONE 7.5 MG-ACETAMINOPHEN 325 MG TABLET
1.0000 | ORAL_TABLET | ORAL | Status: DC | PRN
Start: 2024-06-15 — End: 2024-06-17
  Administered 2024-06-15 – 2024-06-16 (×5): 1 via ORAL
  Administered 2024-06-16: 0 via ORAL
  Administered 2024-06-16 – 2024-06-17 (×3): 1 via ORAL
  Filled 2024-06-15 (×10): qty 1

## 2024-06-15 MED ORDER — DULOXETINE 60 MG CAPSULE,DELAYED RELEASE
60.0000 mg | DELAYED_RELEASE_CAPSULE | Freq: Every day | ORAL | Status: DC
Start: 2024-06-15 — End: 2024-06-17
  Administered 2024-06-15 – 2024-06-17 (×3): 60 mg via ORAL
  Filled 2024-06-15 (×3): qty 1

## 2024-06-15 MED ORDER — ACETAMINOPHEN 325 MG TABLET
325.0000 mg | ORAL_TABLET | ORAL | Status: DC | PRN
Start: 2024-06-15 — End: 2024-06-17
  Administered 2024-06-15 – 2024-06-16 (×2): 325 mg via ORAL
  Filled 2024-06-15 (×2): qty 1

## 2024-06-15 NOTE — Assessment & Plan Note (Signed)
 Continue metoprolol , aspirin  and atorvastatin 

## 2024-06-15 NOTE — Care Plan (Signed)
 Problem: Wound  Goal: Optimal Coping  Outcome: Ongoing (see interventions/notes)  Goal: Optimal Functional Ability  Outcome: Ongoing (see interventions/notes)  Goal: Absence of Infection Signs and Symptoms  Outcome: Ongoing (see interventions/notes)  Intervention: Prevent or Manage Infection  Recent Flowsheet Documentation  Taken 06/15/2024 0720 by Andrez RAMAN, RN  Infection Management: aseptic technique maintained  Goal: Improved Oral Intake  Outcome: Ongoing (see interventions/notes)  Goal: Optimal Pain Control and Function  Outcome: Ongoing (see interventions/notes)  Goal: Skin Health and Integrity  Outcome: Ongoing (see interventions/notes)  Intervention: Optimize Skin Protection  Recent Flowsheet Documentation  Taken 06/15/2024 0720 by Andrez RAMAN, RN  Pressure Reduction Techniques:   Mobility is maximized   Moisture, shear and nutrition are maximized   Frequent weight shifting encouraged  Goal: Optimal Wound Healing  Outcome: Ongoing (see interventions/notes)  Intervention: Promote Wound Healing  Recent Flowsheet Documentation  Taken 06/15/2024 0720 by Andrez RAMAN, RN  Pressure Reduction Techniques:   Mobility is maximized   Moisture, shear and nutrition are maximized   Frequent weight shifting encouraged     Problem: Adult Inpatient Plan of Care  Goal: Plan of Care Review  Outcome: Ongoing (see interventions/notes)  Goal: Patient-Specific Goal (Individualized)  Outcome: Ongoing (see interventions/notes)  Flowsheets (Taken 06/15/2024 0720)  Individualized Care Needs: keep informed, emotional support, pain control  Anxieties, Fears or Concerns: concerned about diagnosis  Goal: Absence of Hospital-Acquired Illness or Injury  Outcome: Ongoing (see interventions/notes)  Intervention: Identify and Manage Fall Risk  Recent Flowsheet Documentation  Taken 06/15/2024 1600 by Andrez RAMAN, RN  Safety Promotion/Fall Prevention:   activity supervised   fall prevention program maintained   nonskid shoes/slippers when out of bed   safety  round/check completed  Taken 06/15/2024 1400 by Andrez RAMAN, RN  Safety Promotion/Fall Prevention:   activity supervised   fall prevention program maintained   nonskid shoes/slippers when out of bed   safety round/check completed  Taken 06/15/2024 1200 by Andrez RAMAN, RN  Safety Promotion/Fall Prevention:   activity supervised   fall prevention program maintained   nonskid shoes/slippers when out of bed   safety round/check completed  Taken 06/15/2024 1000 by Andrez RAMAN, RN  Safety Promotion/Fall Prevention:   activity supervised   fall prevention program maintained   nonskid shoes/slippers when out of bed   safety round/check completed  Taken 06/15/2024 0720 by Andrez RAMAN, RN  Safety Promotion/Fall Prevention:   activity supervised   fall prevention program maintained   nonskid shoes/slippers when out of bed   safety round/check completed  Intervention: Prevent Skin Injury  Recent Flowsheet Documentation  Taken 06/15/2024 0720 by Andrez RAMAN, RN  Skin Protection: adhesive use limited  Intervention: Prevent Infection  Recent Flowsheet Documentation  Taken 06/15/2024 0720 by Andrez RAMAN, RN  Infection Prevention: promote handwashing  Goal: Optimal Comfort and Wellbeing  Outcome: Ongoing (see interventions/notes)  Intervention: Provide Person-Centered Care  Recent Flowsheet Documentation  Taken 06/15/2024 0720 by Andrez RAMAN, RN  Trust Relationship/Rapport:   care explained   choices provided   emotional support provided  Goal: Rounds/Family Conference  Outcome: Ongoing (see interventions/notes)   Required medication for pain this shift. See MAR. No further nausea.  Care delivered per above plan.

## 2024-06-15 NOTE — Assessment & Plan Note (Signed)
 Presented with nonspecific symptoms.    During workup CT chest with soft tissue thickening in right lower lobe concerning for malignancy.  Also with right lower cervical, supraclavicular, mediastinal and right hilar adenopathy compatible with malignancy.    Consult Oncology and pulmonology for further recommendations.

## 2024-06-15 NOTE — Care Management Notes (Signed)
 DCP discussed with patient at bedside. PCP-Dr. FABIENE Lemmings. Prx-[facility]([address]). DME-Nebulizer, Inhaler in working condition. Single level home w/family, emergency contact Dorothe [phone]. Independent w/ADL' prior to admission, no food/finanical insecurities, feels safe in living environment. No acute needs from Care Management, CM will follow up for potential needs. Family to transport patient home.      06/15/24 1259   Assessment Details   Assessment Type Admission   Date of Care Management Update 06/15/24   Readmission   Is this a readmission? No   Insurance Information/Type   Insurance type Medicare   Employment/Financial   Patient has Prescription Coverage?  Yes        Name of Insurance Coverage for Medications [employer]   Financial/Environmental Concerns none   Living Environment   Select an age group to open lives with row.  Adult   Lives With spouse   Living Arrangements house   Able to Return to Prior Arrangements yes   Home Safety   Home Assessment: No Problems Identified   Home Accessibility no concerns   Care Management Plan   Discharge Planning Status initial meeting   Discharge plan discussed with: Patient   CM will evaluate for rehabilitation potential yes   Discharge Needs Assessment   Equipment Currently Used at Home nebulizer;other (see comments)  (Inhaler)   Discharge Facility/Level of Care Needs Home (Patient/Family Member/other)(code 1)   Transportation Available family or friend will provide   Referral Information   Admission Type observation   Arrived From home or self-care   ADVANCE DIRECTIVES   Does the Patient have an Advance Directive? No, Information Offered and Refused   Patient Hand-Off   Clinical/Discharge Plan of Care Information Communicated to:  Medical Social Worker

## 2024-06-15 NOTE — Care Plan (Signed)
 Problem: Wound  Goal: Optimal Coping  06/15/2024 0524 by Francis HERO, GN  Outcome: Ongoing (see interventions/notes)  06/14/2024 2144 by Francis HERO, GN  Outcome: Ongoing (see interventions/notes)  Goal: Optimal Functional Ability  06/15/2024 0524 by Francis HERO, GN  Outcome: Ongoing (see interventions/notes)  06/14/2024 2144 by Francis HERO, GN  Outcome: Ongoing (see interventions/notes)  Goal: Absence of Infection Signs and Symptoms  06/15/2024 0524 by Francis HERO, GN  Outcome: Ongoing (see interventions/notes)  06/14/2024 2144 by Francis HERO, GN  Outcome: Ongoing (see interventions/notes)  Intervention: Prevent or Manage Infection  Recent Flowsheet Documentation  Taken 06/14/2024 1928 by Francis HERO, GN  Fever Reduction/Comfort Measures:   lightweight bedding   lightweight clothing  Infection Management: aseptic technique maintained  Goal: Improved Oral Intake  06/15/2024 0524 by Francis HERO, GN  Outcome: Ongoing (see interventions/notes)  06/14/2024 2144 by Francis HERO, GN  Outcome: Ongoing (see interventions/notes)  Goal: Optimal Pain Control and Function  06/15/2024 0524 by Francis HERO, GN  Outcome: Ongoing (see interventions/notes)  06/14/2024 2144 by Francis HERO, GN  Outcome: Ongoing (see interventions/notes)  Goal: Skin Health and Integrity  06/15/2024 0524 by Francis HERO, GN  Outcome: Ongoing (see interventions/notes)  06/14/2024 2144 by Francis HERO, GN  Outcome: Ongoing (see interventions/notes)  Intervention: Optimize Skin Protection  Recent Flowsheet Documentation  Taken 06/14/2024 1928 by Francis HERO, GN  Head of Bed [facility]) Positioning: 30 degrees  Goal: Optimal Wound Healing  06/15/2024 0524 by Francis HERO, GN  Outcome: Ongoing (see interventions/notes)  06/14/2024 2144 by Francis HERO, GN  Outcome: Ongoing (see interventions/notes)     Problem: Adult Inpatient Plan of Care  Goal: Plan of Care Review  06/15/2024 0524 by Francis HERO, GN  Outcome: Ongoing (see interventions/notes)  06/14/2024 2144 by Francis HERO, GN  Outcome: Ongoing (see interventions/notes)  Goal:  Patient-Specific Goal (Individualized)  06/15/2024 0524 by Francis HERO, GN  Outcome: Ongoing (see interventions/notes)  06/14/2024 2144 by Francis HERO, GN  Outcome: Ongoing (see interventions/notes)  Flowsheets (Taken 06/14/2024 1928)  Individualized Care Needs: keep informed about care  Anxieties, Fears or Concerns: worried about diagnosis  Goal: Absence of Hospital-Acquired Illness or Injury  06/15/2024 0524 by Francis HERO, GN  Outcome: Ongoing (see interventions/notes)  06/14/2024 2144 by Francis HERO, GN  Outcome: Ongoing (see interventions/notes)  Intervention: Identify and Manage Fall Risk  Recent Flowsheet Documentation  Taken 06/15/2024 0400 by Francis HERO, GN  Safety Promotion/Fall Prevention:   activity supervised   fall prevention program maintained   nonskid shoes/slippers when out of bed   safety round/check completed  Taken 06/15/2024 0200 by Francis HERO, GN  Safety Promotion/Fall Prevention:   activity supervised   fall prevention program maintained   nonskid shoes/slippers when out of bed   safety round/check completed  Taken 06/14/2024 2359 by Francis HERO, GN  Safety Promotion/Fall Prevention:   activity supervised   fall prevention program maintained   nonskid shoes/slippers when out of bed   safety round/check completed  Taken 06/14/2024 2200 by Francis HERO, GN  Safety Promotion/Fall Prevention:   activity supervised   fall prevention program maintained   nonskid shoes/slippers when out of bed   safety round/check completed  Taken 06/14/2024 1928 by Francis HERO, GN  Safety Promotion/Fall Prevention:   activity supervised   fall prevention program maintained   nonskid shoes/slippers when out of bed   safety round/check completed  Intervention: Prevent Skin Injury  Recent Flowsheet Documentation  Taken 06/14/2024 1928 by Francis HERO, GN  Body Position:  supine, head elevated  Goal: Optimal Comfort and Wellbeing  06/15/2024 0524 by Francis HERO, GN  Outcome: Ongoing (see interventions/notes)  06/14/2024 2144 by Francis HERO, GN  Outcome: Ongoing (see  interventions/notes)  Goal: Rounds/Family Conference  06/15/2024 0524 by Francis HERO, GN  Outcome: Ongoing (see interventions/notes)  06/14/2024 2144 by Francis HERO, GN  Outcome: Ongoing (see interventions/notes)    Patient transferred from ED to 1415. Care delivered per above care plan.

## 2024-06-15 NOTE — Assessment & Plan Note (Signed)
 Continue metoprolol , aspirin  and atorvastatin   No chest pain or shortness for breath

## 2024-06-15 NOTE — Consults (Signed)
 [facility]  Hematology/Oncology    Consult Note      [name], [name], 68 y.o. female  Date of Birth:  [birth year]  Inpatient Admission Date:  06/14/2024  Date of Service: 06/15/2024    Chief Complaint:  Lung mass    HPI: [name] is a 68 y.o. female who presents  to the ED with complaints of ear pain and flank pain radiating to the abdomen and chest.  Pain is described as sharp.  Reports pain radiates to abdomen and chest.  With associated headaches, generalized weakness, and loss of appetite.  On presentation to the ED, vital signs within normal limits.  Laboratory workup essentially unremarkable.  CT chest with right lower lobe mass and right adenopathy suspicious for malignancy.  Both pulmonology and oncology has been consulted.  Patient does endorse prior history of breast CA with subsequent bilateral mastectomy.  Denies ever receiving CRT.  Does not currently follow with Oncology on routine basis.  Does endorse daily tobacco use/cigarette smoking along with regular marijuana use.  Denies frequent EtOH use.  Denies history of VTE such as deep vein thrombosis or PE.  Denies history of bleeding or clotting disorders.  Does endorse familial history of CA.  Denies fever, chills, night sweats, or unintentional weight loss.  Does endorse loss of appetite as previously stated above.  At time of initial assessment, she denies acute blood loss, dyspnea, N/V/D, dizziness/lightheadedness, palpitations, or other constitutional complaints at this time.    Review of Systems:   Review of Systems   Constitutional:  Negative for activity change, appetite change, chills, diaphoresis, fatigue, fever and unexpected weight change.   HENT:  Negative for congestion, ear pain, facial swelling, hearing loss, mouth sores, nosebleeds, sinus pain, sore throat, tinnitus, trouble swallowing and voice change.    Eyes:  Negative for photophobia, pain and visual disturbance.   Respiratory:  Negative for apnea, cough,  choking, chest tightness, shortness of breath, wheezing and stridor.    Cardiovascular:  Positive for chest pain. Negative for palpitations and leg swelling.   Gastrointestinal:  Positive for abdominal pain. Negative for abdominal distention, anal bleeding, blood in stool, constipation, diarrhea, nausea, rectal pain and vomiting.   Endocrine: Negative for cold intolerance and heat intolerance.   Genitourinary:  Positive for flank pain. Negative for decreased urine volume, difficulty urinating, dysuria, frequency, hematuria and urgency.   Musculoskeletal:  Negative for arthralgias, back pain, gait problem, joint swelling, myalgias and neck pain.   Skin:  Negative for color change, pallor, rash and wound.   Allergic/Immunologic: Negative for immunocompromised state.   Neurological:  Negative for dizziness, syncope, facial asymmetry, speech difficulty, weakness, light-headedness, numbness and headaches.   Hematological:  Negative for adenopathy. Does not bruise/bleed easily.   Psychiatric/Behavioral:  Negative for agitation, behavioral problems and confusion.      Past Medical History:   Diagnosis Date    Abdominal pain     Cancer (CMS HCC)     Chest pain     Chronic lung disease     Coronary artery disease     Dyslipidemia     Essential hypertension     Heart disease     History of percutaneous coronary intervention     History of stress test     Mixed hyperlipidemia     Unspecified chronic bronchitis      Past Surgical History:   Procedure Laterality Date    CARDIAC CATHETERIZATION      CORONARY ANGIOPLASTY  CORONARY ARTERY STENT PLACEMENT      HX BACK SURGERY      HX CORONARY ARTERY BYPASS GRAFT      HX MASTECTOMY, SIMPLE Bilateral     HX TUBAL LIGATION      NECK SURGERY       Social History     Socioeconomic History    Marital status: Widowed     Spouse name: Not on file    Number of children: Not on file    Years of education: Not on file    Highest education level: Not on file   Occupational History    Not on  file   Tobacco Use    Smoking status: Every Day     Current packs/day: 0.50     Types: Cigarettes    Smokeless tobacco: Never   Vaping Use    Vaping status: Never Used   Substance and Sexual Activity    Alcohol use: Yes     Comment: occasional    Drug use: Yes     Types: Marijuana    Sexual activity: Not on file   Other Topics Concern    Not on file   Social History Narrative    Not on file     Social Determinants of Health     Financial Resource Strain: Not on file   Transportation Needs: Not on file   Social Connections: Low Risk (06/14/2024)    Social Connections     SDOH Social Isolation: 5 or more times a week   Intimate Partner Violence: Not on file   Housing Stability: Not on file      Family Medical History:       Problem Relation (Age of Onset)    Arthritis Mother, Maternal Grandmother    Cancer Father, Other    Heart Attack Father, Other    Heart Disease Brother, Other    Hypertension (High Blood Pressure) Mother, Father, Other    Pacemaker Mother          Vital Signs:  Temp  Avg: 36.6 C (97.9 F)  Min: 36.4 C (97.5 F)  Max: 36.7 C (98.1 F)    Pulse  Avg: 88.7  Min: 80  Max: 100 BP  Min: 104/80  Max: 148/98   Resp  Avg: 17.3  Min: 14  Max: 20 SpO2  Avg: 94.2 %  Min: 92 %  Max: 97 %          Input/Output  No intake or output data in the 24 hours ending 06/15/24 1305 I/O last shift:  No intake/output data recorded.     LABS:   Results for orders placed or performed during the hospital encounter of 06/14/24 (from the past 24 hours)   BASIC METABOLIC PANEL, NON-FASTING   Result Value Ref Range    SODIUM 141 133 - 144 mmol/L    POTASSIUM 4.3 3.2 - 5.0 mmol/L    CHLORIDE 106 96 - 106 mmol/L    CO2 TOTAL 23 22 - 30 mmol/L    ANION GAP 12 7 - 18 mmol/L    CALCIUM 9.4 8.3 - 10.7 mg/dL    GLUCOSE 99 74 - 890 mg/dL    BUN 13 8 - 23 mg/dL    CREATININE 9.39 9.49 - 0.90 mg/dL    BUN/CREA RATIO 22     ESTIMATED GFR >90 >90 mL/min/1.103m^2   MAGNESIUM    Result Value Ref Range    MAGNESIUM  1.6 1.6 - 2.4 mg/dL  CBC WITH  DIFF   Result Value Ref Range    WBC 7.4 3.7 - 11.0 x10^3/uL    RBC 4.06 3.85 - 5.22 x10^6/uL    HGB 11.4 (L) 11.5 - 16.0 g/dL    HCT 61.8 65.1 - 53.9 %    MCV 93.8 78.0 - 100.0 fL    MCH 28.1 26.0 - 32.0 pg    MCHC 29.9 (L) 31.0 - 35.5 g/dL    RDW-CV 87.1 88.4 - 84.4 %    PLATELETS 195 150 - 400 x10^3/uL    MPV 10.0 8.7 - 12.5 fL    NEUTROPHIL % 65.0 %    LYMPHOCYTE % 23.5 %    MONOCYTE % 7.3 %    EOSINOPHIL % 2.2 %    BASOPHIL % 0.9 %    NEUTROPHIL # 4.83 1.50 - 7.70 x10^3/uL    LYMPHOCYTE # 1.74 1.00 - 4.80 x10^3/uL    MONOCYTE # 0.54 0.20 - 1.10 x10^3/uL    EOSINOPHIL # 0.16 <=0.50 x10^3/uL    BASOPHIL # <0.10 <=0.20 x10^3/uL    IMMATURE GRANULOCYTE % 1.1 (H) 0.0 - 1.0 %    IMMATURE GRANULOCYTE # <0.10 <0.10 x10^3/uL     IMAGES:  Results for orders placed or performed during the hospital encounter of 06/14/24 (from the past 72 hours)   XR CHEST AP     Status: Abnormal    Narrative    Deysi JEAN Khachatryan    XR CHEST AP, 06/14/2024 1:25 PM    INDICATION:  dyspnea, respiratory illness   Additional History:  dyspnea, respiratory illness    TECHNIQUE:  Single portable frontal view of the chest.  1 views/1 images submitted for interpretation.  COMPARISON: 09/14/2023.  ___________________________________  FINDINGS:    The heart is within normal limits for size. Median sternotomy wires, inferior surgical fusion, and numerous bilateral breast surgical clips are stable. The right hilum is enlarged. No effusion, infiltrate, or pneumothorax is seen. Bilateral apical pulmonary scar is stable. There is mild dextrocurvature of the thoracic spine.  ___________________________________    Impression    Right hilar enlargement may represent mass, adenopathy or vascular enlargement. Enhanced CT chest would be the test of choice for further evaluation.      A Significant Orange actionable finding has been sent via the [employer] Actionable Findings application on 06/14/2024 1:55 PM, Message ID [id number]. Receipt of this communication will  be communicated to [facility] RADIOLOGY STAFF or responsible provider and will be documented in [employer] Actionable Findings System upon receiving the acknowledgement.      Radiologist location ID: [id number]     US  GALLBLADDER     Status: None    Narrative    Alahni JEAN Bose    US  GALLBLADDER performed on 06/14/2024 1:52 PM.    INDICATION:  prior CT questioned cholelithiasis, later ruled out by repeat CT and US , presents back today with nausea/vomiting/RUQ pain, mild transaminitis   Additional History:  abdominal pain; nausea and vomiting    TECHNIQUE:  Transabdominal grayscale sonogram of the gallbladder and common bile duct.      COMPARISON:  CT abdomen/pelvis 01/12/2024.  ___________________________________  FINDINGS:    The common bile duct measures up to 7.6 mm in caliber, mildly enlarged for age but similar to 01/12/2024.  Gallbladder wall thickness: 1.9 mm    The gallbladder is unremarkable in size and wall thickness, without pericholecystic fluid or cholelithiasis.  ___________________________________    Impression    Unremarkable gallbladder.  Mild chronic common  bile duct dilation.      Radiologist location ID: [id number]     CT BRAIN WO IV CONTRAST     Status: None    Narrative    Sheilia JEAN [place]    CT BRAIN WO IV CONTRAST  06/14/2024 2:48 PM    INDICATION:  generalized malaise, generally unwell, n/v, chest mass on XR imaging, further character/staging  Body aches    TECHNIQUE: Head CT performed without IV contrast. Dose modulation, automated exposure control, and/or iterative reconstruction were used for dose reduction.    COMPARISON:  11/08/2006.    FINDINGS:  No evidence of acute intracranial hemorrhage, mass, or acute infarct.    No midline shift.  CSF spaces are relatively symmetric.   Numerous white matter hypodensities.  Well aerated paranasal sinuses and mastoids.          Impression    No evidence of acute intracranial disease.    Findings compatible with chronic small vessel ischemic  changes.        Note that in acute stroke, findings may not be visible on CT for the first 48 hours.       Radiologist location ID: [id number]     CT CHEST ABDOMEN PELVIS W IV CONTRAST     Status: None    Narrative    Akyah JEAN Stutz    CT CHEST ABDOMEN PELVIS W IV CONTRAST performed on 06/14/2024 2:55 PM.    INDICATION:  generalized malaise, generally unwell, n/v, chest mass on XR imaging, further character/staging   Additional History:  Body aches, vomiting, abdominal pain    TECHNIQUE:  CT chest, abdomen, and pelvis with axial, coronal, and sagittal multiplanar reformations, performed with IV contrast. Dose modulation, automated exposure control, and/or iterative reconstruction were used for dose reduction.    CONTRAST:  75 mL Isovue  370 IV    COMPARISON:  Same date CXR. CT abdomen/pelvis 01/12/2024. CT chest 12/28/2023..  ___________________________________  FINDINGS:    Chest:  * Right lower cervical and supraclavicular adenopathy including a 2 cm supraclavicular node. Multistation mediastinal adenopathy involving the prevascular, bilateral paratracheal, and subcarinal stations, for example a 2 cm right lower paratracheal node and 2.8 cm subcarinal node. Right hilar adenopathy measuring up to 1.9 cm.  * No aortic aneurysm or central pulmonary embolism. Atherosclerosis with CAD post CABG.  * No pleural effusion.  * Emphysema. New branching expansile changes along the bronchovascular bundles of the right lower lobe with local nodularity, malignancy until otherwise proven. Otherwise no focal consolidation, pulmonary edema or mass is identified. Similar apical right upper lobe scarring.  * Patent central airways. Proximal right bronchial narrowing through the level of the mediastinal adenopathy.  * No aggressive osseous lesion is identified. Degenerative disc disease. Postsurgical changes in the bilateral breasts with scattered surgical clips.    Abdomen/pelvis:  * Multiple new indistinct subcentimeter hypodensities  in the right hemiliver suspicious for metastasis.  * Normal caliber gallbladder and bile ducts.  * No splenomegaly.  * No pancreatic inflammation or ductal dilation.  * No adrenal nodule.  * Bilateral nephrolithiasis an small renal cysts. No hydronephrosis or identified mass.  * Normal caliber bowel without overt inflammatory changes. Colonic diverticulosis.  * No free fluid or gas, bulky adenopathy or aortic aneurysm. Advanced atherosclerosis.  * Unremarkable uterus, adnexa and urinary bladder.  * No aggressive osseous lesion.    ___________________________________    Impression    Chest:  1. Right lower cervical, supraclavicular, mediastinal and right hilar adenopathy compatible  with malignancy.  2. New expansile soft tissue thickening along the bronchovascular bundles of the right lower lobe, considered malignancy until otherwise proven.  3. Emphysema.    Abdomen/pelvis:  1. New subcentimeter hypoechoic hepatic lesions, presumably metastatic.  2. Additional chronic and incidental findings as above.    PET/CT may be of diagnostic benefit.            Radiologist location ID: [id number]       Physical Exam:  Physical Exam  Vitals reviewed.   Constitutional:       Appearance: Normal appearance. She is normal weight.   HENT:      Head: Normocephalic.      Nose: Nose normal.      Mouth/Throat:      Pharynx: Oropharynx is clear.   Eyes:      Extraocular Movements: Extraocular movements intact.      Conjunctiva/sclera: Conjunctivae normal.   Cardiovascular:      Rate and Rhythm: Normal rate.      Pulses: Normal pulses.   Pulmonary:      Effort: Pulmonary effort is normal.   Abdominal:      Palpations: Abdomen is soft.   Musculoskeletal:         General: Normal range of motion.      Cervical back: Normal range of motion.   Skin:     General: Skin is dry.   Neurological:      General: No focal deficit present.      Mental Status: She is alert and oriented to person, place, and time. Mental status is at baseline.    Psychiatric:         Mood and Affect: Mood normal.         Behavior: Behavior normal.         Thought Content: Thought content normal.         Judgment: Judgment normal.       Problem List:  Active Hospital Problems   (*Primary Problem)    Diagnosis    *Lung mass    Simple chronic bronchitis (CMS HCC)    Metastatic cancer to liver (CMS HCC)    Malaise    Feeling unwell    RUQ pain    History of breast cancer    Paroxysmal atrial fibrillation (CMS HCC)    S/P CABG x 1    Coronary artery disease    Hypertension    Hyperlipidemia      Assessment/Plan:    68 year old female presenting with lung mass:    Metastatic cancer: Lung mass with suggested hepatic metastases as evidenced by CT CAP.  Pulmonology also following.  Patient agreeable to EBUS bronchoscopy with biopsy next week.  In the interim, check tumor markers.  We will continue to follow.    History of breast cancer: Does endorse previous breast CA history with subsequent bilateral mastectomy - no hx of CRT.  Does not currently follow with Oncology on outpatient basis.    Paroxysmal atrial fib:  Currently NSR.  On beta-blocker.    CAD:  History of CABG x1.  On cardioprotective medication 81 mg ASA daily.    Hyperlipidemia: On statin.    Hypertension: Managed with antihypertensives.         Attending note:  I have independently seen and examined the patient on the same date of service as our mid-level.  I agree with the plan of care as documented by my mid-level.  I have personally reviewed  the pertinent  medications, labs  and radiographic studies  as documented in my mid-level note.  The case was discussed in detail with the midlevel provider.  I agree with the above note.  Some changes and additions were made.  I spent more than 50% of the total encounter time for the care of this patient and documentation.      Norleen Amen, MD

## 2024-06-15 NOTE — Assessment & Plan Note (Signed)
 Continue atorvastatin 

## 2024-06-15 NOTE — Assessment & Plan Note (Addendum)
 Continue metoprolol , aspirin  and atorvastatin   No chest pain or shortness for breath

## 2024-06-15 NOTE — Assessment & Plan Note (Signed)
 S/p double mastectomy 2 years ago at [facility]

## 2024-06-15 NOTE — Progress Notes (Addendum)
 [place] [facility] Progress Note    [name]       68 y.o.       Date of service: 06/15/2024  Date of Admission:  06/14/2024    Hospital Day:  LOS: 1 day   CC: Lung mass    Subjective:  Patient said she had presented to the emergency room for nonspecific complaints fatigue losing weight anorexia.  The patient also says she has a lot of low back pain and.  Abdominal pain  workup in the emergency room showed lung mass with what appears to be significant metastasis.  The patient is a former smoker and she has a history of breast cancer    This morning the patient is pain-free and has no acute complaints Pulmonary and Oncology to evaluate.  We reviewed the results of the scans we will resume home medications.  Once biopsies can be taken the patient would prefer to go home and follow up in as an outpatient for the path reports.      Objective:   Filed Vitals:    06/15/24 0240 06/15/24 0802 06/15/24 0808 06/15/24 0837   BP: 114/80 (!) 148/98     Pulse: 86 87 88    Resp: 18 18  14    Temp: 36.7 C (98.1 F) 36.5 C (97.7 F)     SpO2: 94% 92%       Oxygen Device  SpO2: 92 %        I have reviewed the vitals.      Input/Output  No intake or output data in the 24 hours ending 06/15/24 1106      acetaminophen  (TYLENOL ) tablet, 650 mg, Oral, Q4H PRN  aspirin  chewable tablet 81 mg, 81 mg, Oral, Daily  atorvastatin  (LIPITOR) tablet, 40 mg, Oral, Daily  cholecalciferol  (VITAMIN D3) 1000 unit (25 mcg) tablet, 1,000 Units, Oral, Daily  clonazePAM  (klonoPIN ) tablet, 0.5 mg, Oral, 2x/day PRN  docusate sodium  (COLACE) capsule, 100 mg, Oral, 2x/day PRN  DULoxetine  (CYMBALTA ) delayed release capsule, 60 mg, Oral, Daily  gabapentin  (NEURONTIN ) capsule, 400 mg, Oral, Daily  heparin  5,000 unit/mL injection, 5,000 Units, Subcutaneous, Q8HRS  losartan  (COZAAR ) tablet, 25 mg, Oral, Daily  metoprolol  tartrate (LOPRESSOR ) tablet, 25 mg, Oral, 2x/day  morphine  4 mg/mL injection, 3 mg, Intravenous, Q4H PRN  omega-3  fatty acids (LOVAZA ) capsule, 1 g, Oral, Daily  ondansetron  (ZOFRAN ) 2 mg/mL injection, 4 mg, Intravenous, Q8H PRN  pantoprazole  (PROTONIX ) delayed release tablet, 40 mg, Oral, Daily  sertraline  (ZOLOFT ) tablet, 100 mg, Oral, Daily  traZODone  (DESYREL ) tablet 150 mg, 150 mg, Oral, NIGHTLY        Physical Exam:  General: No acute distress  HEENT: Normal cephalic atraumatic; No Asymmetry  Neck: No JVD, No Bruit.  Cardiac: Regular rate and rhythm, no murmur auscultated.  Respiratory: Clear to auscultation bilaterally without wheeze.  Abdomen: Positive bowel sounds, soft, non-tender  Neurological: Alert and Orient X 3; Cranial Nerves II-XII Intact, No focal neurological deficits  Extremities: No peripheral edema noted on exam.    CBC (Last 24 Hours):    Recent Results last 24 hours     06/14/24  1146 06/15/24  0538   WBC 10.4 7.4   HGB 12.3 11.4*   HCT 39.3 38.1   MCV 91.2 93.8   PLTCNT 207 195         BMP (Last 24 Hours):    Recent Results last 24 hours     06/14/24  1146 06/15/24  0538   SODIUM 140 141   POTASSIUM 4.9 4.3   CHLORIDE 105 106   CO2 23 23   BUN 18 13   CREATININE 0.85 0.60   CALCIUM 10.0 9.4   GLUCOSENF 107 99           I have reviewed all labs.    Micro:   Hospital Encounter on 06/14/24 (from the past 96 hours)   STREPTOCOCCUS PYOGENES, PCR    Collection Time: 06/14/24 12:09 PM    Specimen: Swab   Culture Result Status    STREPTOCOCCUS PYOGENES, PCR Negative Final       Radiology:    Results for orders placed or performed during the hospital encounter of 06/14/24 (from the past 24 hours)   XR CHEST AP     Status: Abnormal    Narrative    Keimya JEAN Stamper    XR CHEST AP, 06/14/2024 1:25 PM    INDICATION:  dyspnea, respiratory illness   Additional History:  dyspnea, respiratory illness    TECHNIQUE:  Single portable frontal view of the chest.  1 views/1 images submitted for interpretation.  COMPARISON: 09/14/2023.  ___________________________________  FINDINGS:    The heart is within normal limits for size.  Median sternotomy wires, inferior surgical fusion, and numerous bilateral breast surgical clips are stable. The right hilum is enlarged. No effusion, infiltrate, or pneumothorax is seen. Bilateral apical pulmonary scar is stable. There is mild dextrocurvature of the thoracic spine.  ___________________________________    Impression    Right hilar enlargement may represent mass, adenopathy or vascular enlargement. Enhanced CT chest would be the test of choice for further evaluation.      A Significant Orange actionable finding has been sent via the [employer] Actionable Findings application on 06/14/2024 1:55 PM, Message ID [id number]. Receipt of this communication will be communicated to [facility] RADIOLOGY STAFF or responsible provider and will be documented in [employer] Actionable Findings System upon receiving the acknowledgement.      Radiologist location ID: [id number]     US  GALLBLADDER     Status: None    Narrative    Littie JEAN Coster    US  GALLBLADDER performed on 06/14/2024 1:52 PM.    INDICATION:  prior CT questioned cholelithiasis, later ruled out by repeat CT and US , presents back today with nausea/vomiting/RUQ pain, mild transaminitis   Additional History:  abdominal pain; nausea and vomiting    TECHNIQUE:  Transabdominal grayscale sonogram of the gallbladder and common bile duct.      COMPARISON:  CT abdomen/pelvis 01/12/2024.  ___________________________________  FINDINGS:    The common bile duct measures up to 7.6 mm in caliber, mildly enlarged for age but similar to 01/12/2024.  Gallbladder wall thickness: 1.9 mm    The gallbladder is unremarkable in size and wall thickness, without pericholecystic fluid or cholelithiasis.  ___________________________________    Impression    Unremarkable gallbladder.  Mild chronic common bile duct dilation.      Radiologist location ID: [id number]     CT BRAIN WO IV CONTRAST     Status: None    Narrative    Bobbyjo JEAN Breit    CT BRAIN WO IV CONTRAST  06/14/2024 2:48  PM    INDICATION:  generalized malaise, generally unwell, n/v, chest mass on XR imaging, further character/staging  Body aches    TECHNIQUE: Head CT performed without IV contrast. Dose modulation, automated exposure control, and/or iterative reconstruction were used for dose reduction.    COMPARISON:  11/08/2006.    FINDINGS:  No evidence of acute intracranial hemorrhage, mass, or acute infarct.    No midline shift.  CSF spaces are relatively symmetric.   Numerous white matter hypodensities.  Well aerated paranasal sinuses and mastoids.          Impression    No evidence of acute intracranial disease.    Findings compatible with chronic small vessel ischemic changes.        Note that in acute stroke, findings may not be visible on CT for the first 48 hours.       Radiologist location ID: [id number]     CT CHEST ABDOMEN PELVIS W IV CONTRAST     Status: None    Narrative    Ismael JEAN Edgar    CT CHEST ABDOMEN PELVIS W IV CONTRAST performed on 06/14/2024 2:55 PM.    INDICATION:  generalized malaise, generally unwell, n/v, chest mass on XR imaging, further character/staging   Additional History:  Body aches, vomiting, abdominal pain    TECHNIQUE:  CT chest, abdomen, and pelvis with axial, coronal, and sagittal multiplanar reformations, performed with IV contrast. Dose modulation, automated exposure control, and/or iterative reconstruction were used for dose reduction.    CONTRAST:  75 mL Isovue  370 IV    COMPARISON:  Same date CXR. CT abdomen/pelvis 01/12/2024. CT chest 12/28/2023..  ___________________________________  FINDINGS:    Chest:  * Right lower cervical and supraclavicular adenopathy including a 2 cm supraclavicular node. Multistation mediastinal adenopathy involving the prevascular, bilateral paratracheal, and subcarinal stations, for example a 2 cm right lower paratracheal node and 2.8 cm subcarinal node. Right hilar adenopathy measuring up to 1.9 cm.  * No aortic aneurysm or central pulmonary embolism.  Atherosclerosis with CAD post CABG.  * No pleural effusion.  * Emphysema. New branching expansile changes along the bronchovascular bundles of the right lower lobe with local nodularity, malignancy until otherwise proven. Otherwise no focal consolidation, pulmonary edema or mass is identified. Similar apical right upper lobe scarring.  * Patent central airways. Proximal right bronchial narrowing through the level of the mediastinal adenopathy.  * No aggressive osseous lesion is identified. Degenerative disc disease. Postsurgical changes in the bilateral breasts with scattered surgical clips.    Abdomen/pelvis:  * Multiple new indistinct subcentimeter hypodensities in the right hemiliver suspicious for metastasis.  * Normal caliber gallbladder and bile ducts.  * No splenomegaly.  * No pancreatic inflammation or ductal dilation.  * No adrenal nodule.  * Bilateral nephrolithiasis an small renal cysts. No hydronephrosis or identified mass.  * Normal caliber bowel without overt inflammatory changes. Colonic diverticulosis.  * No free fluid or gas, bulky adenopathy or aortic aneurysm. Advanced atherosclerosis.  * Unremarkable uterus, adnexa and urinary bladder.  * No aggressive osseous lesion.    ___________________________________    Impression    Chest:  1. Right lower cervical, supraclavicular, mediastinal and right hilar adenopathy compatible with malignancy.  2. New expansile soft tissue thickening along the bronchovascular bundles of the right lower lobe, considered malignancy until otherwise proven.  3. Emphysema.    Abdomen/pelvis:  1. New subcentimeter hypoechoic hepatic lesions, presumably metastatic.  2. Additional chronic and incidental findings as above.    PET/CT may be of diagnostic benefit.            Radiologist location ID: [id number]         PT/OT: No    Assessment/ Plan:   Assessment & Plan  Lung mass   Presented  with nonspecific symptoms.    During workup CT chest with soft tissue thickening in  right lower lobe concerning for malignancy.  Also with right lower cervical, supraclavicular, mediastinal and right hilar adenopathy compatible with malignancy.    Consult Oncology and pulmonology for further recommendations.  Coronary artery disease  S/P CABG x 1   Continue metoprolol , aspirin  and atorvastatin   No chest pain or shortness for breath  Hypertension    Blood pressure acceptable.    Continue metoprolol  and losartan .    Monitor blood pressure and adjust as needed.  Hyperlipidemia    Continue atorvastatin .  Paroxysmal atrial fibrillation (CMS HCC)    Rate controlled.  Continue metoprolol .    Not on anticoagulation.  Follows up outpatient with Cardiology.  History of breast cancer    S/p double mastectomy 2 years ago at [facility]  Simple chronic bronchitis (CMS HCC)  Stable continue supportive care           Lamar Bryant, MD

## 2024-06-15 NOTE — Assessment & Plan Note (Signed)
 Blood pressure acceptable.    Continue metoprolol  and losartan .    Monitor blood pressure and adjust as needed.

## 2024-06-15 NOTE — Assessment & Plan Note (Signed)
Stable; continue supportive care 

## 2024-06-15 NOTE — Consults (Signed)
 [facility]  Pulmonary Consult Initial    [name], [name], 68 y.o. female  Encounter Start Date:  06/14/2024  Inpatient Admission Date:  06/14/2024  Date of service: 06/15/2024  Date of Birth:  January 22, 1956    Hospital Day:  LOS: 1 day     Service: Pulmonary    HPI/Discussion:  [name] is a 68 y.o., White female who presents with complaints of ear and flank pain radiating to the abdomen and chest.  Workup in ED showed unremarkable labs but CT of the chest showed right lower lobe mass and right adenopathy, suspicious for malignancy prompting our consult. She does has a history of breast cancer at [facility]. She has reported a 10 lb weight loss.  Denies any respiratory symptoms but is a 1 pack per day smoker.  Does report pain radiating to abdomen and chest.  Denies nausea vomiting.      Past Medical History:   Diagnosis Date    Abdominal pain     Cancer (CMS HCC)     Chest pain     Chronic lung disease     Coronary artery disease     Dyslipidemia     Essential hypertension     Heart disease     History of percutaneous coronary intervention     History of stress test     Mixed hyperlipidemia     Unspecified chronic bronchitis      Past Surgical History:   Procedure Laterality Date    CARDIAC CATHETERIZATION      CORONARY ANGIOPLASTY      CORONARY ARTERY STENT PLACEMENT      HX BACK SURGERY      HX CORONARY ARTERY BYPASS GRAFT      HX MASTECTOMY, SIMPLE Bilateral     HX TUBAL LIGATION      NECK SURGERY       Medications Prior to Admission       Prescriptions    anastrozole  (ARIMIDEX ) 1 mg Oral Tablet    Take 1 Tablet (1 mg total) by mouth Daily    aspirin  81 mg Oral Tablet, Chewable    Chew 1 Tablet (81 mg total) Once a day    atorvastatin  (LIPITOR) 40 mg Oral Tablet    Take 1 Tablet (40 mg total) by mouth Daily    cholecalciferol , vitamin D3, 25 mcg (1,000 unit) Oral Tablet    Take 1 Tablet (1,000 Units total) by mouth Daily    clonazePAM  (KLONOPIN ) 0.5 mg Oral Tablet    Take 1  Tablet (0.5 mg total) by mouth Twice per day as needed for Other (ANXIETY)    DULoxetine  (CYMBALTA  DR) 60 mg Oral Capsule, Delayed Release(E.C.)    Take 1 Capsule (60 mg total) by mouth Daily    fluticasone  propionate (FLONASE ) 50 mcg/actuation Nasal Spray, Suspension    1 Spray Once per day as needed for Other (CONGESTION)    gabapentin  (NEURONTIN ) 400 mg Oral Capsule    Take 1 Capsule (400 mg total) by mouth Daily    losartan  (COZAAR ) 25 mg Oral Tablet    Take 1 Tablet (25 mg total) by mouth Daily    metoprolol  tartrate (LOPRESSOR ) 25 mg Oral Tablet    Take 1 Tablet (25 mg total) by mouth Twice daily    MV,Ca,Ir,Min-FA-Lut-Lyco-HC153 3-133-33 mg-mcg-mcg Oral Capsule    Take 1 Capsule by mouth Daily    omega-3 fatty acid (LOVAZA ) 1 gram Oral Capsule  Take 1 Capsule (1 g total) by mouth Daily    pantoprazole  (PROTONIX ) 40 mg Oral Tablet, Delayed Release (E.C.)    Take 1 Tablet (40 mg total) by mouth Daily    promethazine  (PHENERGAN ) 25 mg Oral Tablet    Take 1 Tablet (25 mg total) by mouth Every 8 hours as needed for Nausea/Vomiting    sertraline  (ZOLOFT ) 100 mg Oral Tablet    Take 1 Tablet (100 mg total) by mouth Daily    traZODone  (DESYREL ) 150 mg Oral Tablet    Take 1 Tablet (150 mg total) by mouth Every night          acetaminophen  (TYLENOL ) tablet, 325 mg, Oral, Q4H PRN  aspirin  chewable tablet 81 mg, 81 mg, Oral, Daily  atorvastatin  (LIPITOR) tablet, 40 mg, Oral, Daily  cholecalciferol  (VITAMIN D3) 1000 unit (25 mcg) tablet, 1,000 Units, Oral, Daily  clonazePAM  (klonoPIN ) tablet, 0.5 mg, Oral, 2x/day PRN  docusate sodium  (COLACE) capsule, 100 mg, Oral, 2x/day PRN  DULoxetine  (CYMBALTA ) delayed release capsule, 60 mg, Oral, Daily  gabapentin  (NEURONTIN ) capsule, 400 mg, Oral, Daily  heparin  5,000 unit/mL injection, 5,000 Units, Subcutaneous, Q8HRS  HYDROcodone -acetaminophen  (NORCO) 7.5 mg-325 mg per tablet, 1 Tablet, Oral, Q4H PRN  losartan  (COZAAR ) tablet, 25 mg, Oral, Daily  metoprolol  tartrate (LOPRESSOR )  tablet, 25 mg, Oral, 2x/day  morphine  4 mg/mL injection, 3 mg, Intravenous, Q4H PRN  omega-3 fatty acids (LOVAZA ) capsule, 1 g, Oral, Daily  ondansetron  (ZOFRAN ) 2 mg/mL injection, 4 mg, Intravenous, Q8H PRN  pantoprazole  (PROTONIX ) delayed release tablet, 40 mg, Oral, Daily  sertraline  (ZOLOFT ) tablet, 100 mg, Oral, Daily  traZODone  (DESYREL ) tablet 150 mg, 150 mg, Oral, NIGHTLY      Allergies[1]  Social History     Tobacco Use    Smoking status: Every Day     Current packs/day: 0.50     Types: Cigarettes    Smokeless tobacco: Never   Substance Use Topics    Alcohol use: Yes     Comment: occasional       Family History:    Family Medical History:       Problem Relation (Age of Onset)    Arthritis Mother, Maternal Grandmother    Cancer Father, Other    Heart Attack Father, Other    Heart Disease Brother, Other    Hypertension (High Blood Pressure) Mother, Father, Other    Pacemaker Mother          ROS:   Other than ROS in the HPI, all other review of systems were negative except for:  Review of systems:   Constitutional:  No fevers, chills, sweats, malaise, or weight change.  HEENT:  No hair loss, facial trauma, nasal discharge, sore throat, or earache.  Eyes:  No acute visual change, discomfort, or discoloration.  Cardiovascular:  No chest pain, palpitation, peripheral edema, or orthopnea.   Respiratory:  Denies shortness of breath, coughing, wheezing, or hemoptysis.  Gastrointestinal:  No nausea, vomiting, constipation, abdominal pain, or diarrhea.  Musculoskeletal:  No focal redness, swelling, weakness, or edema.  Genitourinary:  No dysuria, hematuria, or urinary frequency.    EXAM:  Temperature: 36.4 C (97.5 F)  Heart Rate: 80  BP (Non-Invasive): (!) 129/90  Respiratory Rate: 14  SpO2: 95 %  Constitutional: appears in good health, comfortable, appears older than stated age, and no distress  Eyes: Pupils equal and round, reactive to light and accomodation. , Sclera non-icteric. , normal findings: lids and  lashes normal and conjunctivae and sclerae  normal  ENT: Head atraumatic and normocephalic, External Ears: normal pinnae shape and position and no signs of inflammation, Nose without erythema. , Mouth mucous membranes moist. , No oral lesions.   Neck: no thyromegaly or lymphadenopathy, supple, symmetrical, trachea midline, and no adenopathy  Respiratory: Breathing nonlabored, Clear to auscultation bilaterally. , normal percussion bilaterally  Cardiovascular: regular rate and rhythm, S1, S2 normal, no murmur, click, rub or gallop  Gastrointestinal: non-distended, Soft, non-tender, Bowel sounds normal, No hepatosplenomegaly, No masses  Musculoskeletal: 5/5 in both upper and lower extremities, normal, and normal  Integumentary:  Skin warm and dry and Skin color, texture, turgor normal. No rashes or lesions  Neurologic: Grossly normal. Alert and oriented x3, CN II - XII grossly intact , No tremor  Lymphatic/Immunologic/Hematologic: No lymphadenopathy  Psychiatric: Normal affect, behavior, memory, thought content, judgement, and speech.    Labs:  Lab Results Today:    Results for orders placed or performed during the hospital encounter of 06/14/24 (from the past 24 hours)   SHORT RESPIRATORY PANEL   Result Value Ref Range    SARS-CoV-2 Negative Negative, Invalid    INFLUENZA A RNA Negative Negative    INFLUENZA B RNA Negative Negative    RSV Negative Negative    HMPV Negative Negative   BASIC METABOLIC PANEL, NON-FASTING   Result Value Ref Range    SODIUM 141 133 - 144 mmol/L    POTASSIUM 4.3 3.2 - 5.0 mmol/L    CHLORIDE 106 96 - 106 mmol/L    CO2 TOTAL 23 22 - 30 mmol/L    ANION GAP 12 7 - 18 mmol/L    CALCIUM 9.4 8.3 - 10.7 mg/dL    GLUCOSE 99 74 - 890 mg/dL    BUN 13 8 - 23 mg/dL    CREATININE 9.39 9.49 - 0.90 mg/dL    BUN/CREA RATIO 22     ESTIMATED GFR >90 >90 mL/min/1.35m^2   MAGNESIUM    Result Value Ref Range    MAGNESIUM  1.6 1.6 - 2.4 mg/dL   CBC WITH DIFF   Result Value Ref Range    WBC 7.4 3.7 - 11.0 x10^3/uL     RBC 4.06 3.85 - 5.22 x10^6/uL    HGB 11.4 (L) 11.5 - 16.0 g/dL    HCT 61.8 65.1 - 53.9 %    MCV 93.8 78.0 - 100.0 fL    MCH 28.1 26.0 - 32.0 pg    MCHC 29.9 (L) 31.0 - 35.5 g/dL    RDW-CV 87.1 88.4 - 84.4 %    PLATELETS 195 150 - 400 x10^3/uL    MPV 10.0 8.7 - 12.5 fL    NEUTROPHIL % 65.0 %    LYMPHOCYTE % 23.5 %    MONOCYTE % 7.3 %    EOSINOPHIL % 2.2 %    BASOPHIL % 0.9 %    NEUTROPHIL # 4.83 1.50 - 7.70 x10^3/uL    LYMPHOCYTE # 1.74 1.00 - 4.80 x10^3/uL    MONOCYTE # 0.54 0.20 - 1.10 x10^3/uL    EOSINOPHIL # 0.16 <=0.50 x10^3/uL    BASOPHIL # <0.10 <=0.20 x10^3/uL    IMMATURE GRANULOCYTE % 1.1 (H) 0.0 - 1.0 %    IMMATURE GRANULOCYTE # <0.10 <0.10 x10^3/uL       Imaging Studies:  No results found.    Assessment/Recommendations:   Active Hospital Problems    Diagnosis    Primary Problem: Lung mass    Simple chronic bronchitis (CMS HCC)    History of breast cancer  Paroxysmal atrial fibrillation (CMS HCC)    S/P CABG x 1    Coronary artery disease    Hypertension    Hyperlipidemia       Patient seen and examined  Charts/labs/imaging personally reviewed.  Patient presented with flank and chest pain radiating to abdomen,  CT scan showed right lower cervical and supraclavicular adenopathy including O2 cm supraclavicular nodule attestation mediastinal adenopathy.  We will plan for EBUS bronchoscopy Monday or Tuesday. Patient agreeable.  Oncology following.   Currently on room air.  Encourages smoking cessation.    Patient was seen with Dr. Abran. He was independently examined by him and he helped to formulate this plan. He will add any further orders and recommendations to his addendum.    Allean Brought, FNP-BC  06/15/2024 12:15     I personally saw and evaluated the patient as part of a shared service with an APP.    My substantive findings are:  MDM (complete) greater than 50% of medical decision-making by me personally.  I have addressed 1 or more acute illness requiring hospitalization and or causing organ  dysfunction including:  Lung mass, simple chronic bronchitis, atrial fibrillation, status post bypass    I personally viewed patient's CBC BNP troponin lactic acid  CT films I reviewed shows right lower cervical and supraclavicular adenopathy including some supraclavicular nodule noted with mediastinal adenopathy  We will plan for endobronchial ultrasound and biopsy likely Monday or Tuesday patient is agreeable  Oncology following  Patient currently on room air  Encourage smoking cessation    Lynwood JONETTA Abran III, DO  06/15/2024 13:42        [1] No Known Allergies

## 2024-06-15 NOTE — Assessment & Plan Note (Signed)
 Rate controlled.  Continue metoprolol .    Not on anticoagulation.  Follows up outpatient with Cardiology.

## 2024-06-16 DIAGNOSIS — R918 Other nonspecific abnormal finding of lung field: Secondary | ICD-10-CM

## 2024-06-16 DIAGNOSIS — C3431 Malignant neoplasm of lower lobe, right bronchus or lung: Secondary | ICD-10-CM

## 2024-06-16 LAB — ALPHA FETOPROTEIN (AFP) TUMOR MARKER: AFP TUMOR MARKER: 3.7 ng/mL (ref 0.0–8.3)

## 2024-06-16 LAB — CARCINOEMBRYONIC ANTIGEN: CEA: 5.5 ng/mL — ABNORMAL HIGH (ref 0.0–4.7)

## 2024-06-16 NOTE — Assessment & Plan Note (Addendum)
 Rate controlled.  Continue metoprolol .    Not on anticoagulation.      Follows up outpatient with Cardiology.

## 2024-06-16 NOTE — Assessment & Plan Note (Signed)
 Continue atorvastatin 

## 2024-06-16 NOTE — Care Plan (Signed)
 Problem: Wound  Goal: Optimal Coping  Outcome: Ongoing (see interventions/notes)  Goal: Optimal Functional Ability  Outcome: Ongoing (see interventions/notes)  Goal: Absence of Infection Signs and Symptoms  Outcome: Ongoing (see interventions/notes)  Intervention: Prevent or Manage Infection  Recent Flowsheet Documentation  Taken 06/15/2024 1925 by Francis HERO, GN  Fever Reduction/Comfort Measures:   lightweight bedding   lightweight clothing  Infection Management: aseptic technique maintained  Goal: Improved Oral Intake  Outcome: Ongoing (see interventions/notes)  Goal: Optimal Pain Control and Function  Outcome: Ongoing (see interventions/notes)  Goal: Skin Health and Integrity  Outcome: Ongoing (see interventions/notes)  Intervention: Optimize Skin Protection  Recent Flowsheet Documentation  Taken 06/15/2024 1925 by Francis HERO, GN  Pressure Reduction Techniques: Mobility is maximized  Head of Bed (HOB) Positioning: HOB at 30-45 degrees  Goal: Optimal Wound Healing  Outcome: Ongoing (see interventions/notes)  Intervention: Promote Wound Healing  Recent Flowsheet Documentation  Taken 06/15/2024 1925 by Francis HERO, GN  Pressure Reduction Techniques: Mobility is maximized     Problem: Adult Inpatient Plan of Care  Goal: Plan of Care Review  Outcome: Ongoing (see interventions/notes)  Goal: Patient-Specific Goal (Individualized)  Outcome: Ongoing (see interventions/notes)  Flowsheets (Taken 06/15/2024 1925)  Individualized Care Needs: keep informed, pain control, emotional support  Anxieties, Fears or Concerns: concerned about diagnosis  Goal: Absence of Hospital-Acquired Illness or Injury  Outcome: Ongoing (see interventions/notes)  Intervention: Identify and Manage Fall Risk  Recent Flowsheet Documentation  Taken 06/16/2024 0200 by Francis HERO, GN  Safety Promotion/Fall Prevention:   activity supervised   fall prevention program maintained   nonskid shoes/slippers when out of bed   safety round/check completed  Taken 06/16/2024  0000 by Francis HERO, GN  Safety Promotion/Fall Prevention:   activity supervised   fall prevention program maintained   nonskid shoes/slippers when out of bed   safety round/check completed  Taken 06/15/2024 2200 by Francis HERO, GN  Safety Promotion/Fall Prevention:   activity supervised   fall prevention program maintained   nonskid shoes/slippers when out of bed   safety round/check completed  Taken 06/15/2024 2000 by Francis HERO, GN  Safety Promotion/Fall Prevention:   activity supervised   fall prevention program maintained   nonskid shoes/slippers when out of bed   safety round/check completed  Taken 06/15/2024 1925 by Francis HERO, GN  Safety Promotion/Fall Prevention: activity supervised  Intervention: Prevent Skin Injury  Recent Flowsheet Documentation  Taken 06/15/2024 1925 by Francis HERO, GN  Body Position: supine, head elevated  Skin Protection: adhesive use limited  Intervention: Prevent Infection  Recent Flowsheet Documentation  Taken 06/15/2024 1925 by Francis HERO, GN  Infection Prevention: promote handwashing  Goal: Optimal Comfort and Wellbeing  Outcome: Ongoing (see interventions/notes)  Intervention: Provide Person-Centered Care  Recent Flowsheet Documentation  Taken 06/15/2024 1925 by Francis HERO, GN  Trust Relationship/Rapport:   care explained   choices provided   questions answered   questions encouraged   reassurance provided  Goal: Rounds/Family Conference  Outcome: Ongoing (see interventions/notes)    Care delivered per above care plan

## 2024-06-16 NOTE — Assessment & Plan Note (Addendum)
 Presented with nonspecific symptoms.  During workup CT chest with soft tissue thickening in right lower lobe concerning for malignancy.  Also with right lower cervical, supraclavicular, mediastinal and right hilar adenopathy compatible with malignancy.  Consulted Oncology and pulmonology for further recommendations.  For bronch with biopsy tomorrow  Will need outpatient PET scan  Discussed we cannot formulate treatment plan until tissue diagnosis returns  Likely discharge home after biopsy tomorrow

## 2024-06-16 NOTE — Assessment & Plan Note (Addendum)
 Continue metoprolol , aspirin  and atorvastatin   No chest pain or shortness for breath

## 2024-06-16 NOTE — Consults (Signed)
 [facility]  Hematology/Oncology    Progress Note      December, Hedtke, 68 y.o. female  Date of Birth:  [birth year]  Inpatient Admission Date:  06/14/2024  Date of Service: 06/16/2024    Chief Complaint:  Lung mass    HPI:  Sitting up in bed, resting.  Family at bedside.  Reports feeling better overall today.  Anticipating bronchoscopy with biopsy tomorrow per pulmonology, 06/17/2024.    Review of Systems:   Review of Systems   Constitutional:  Negative for activity change, appetite change, chills, diaphoresis, fatigue, fever and unexpected weight change.   HENT:  Negative for congestion, ear pain, facial swelling, hearing loss, mouth sores, nosebleeds, sinus pain, sore throat, tinnitus, trouble swallowing and voice change.    Eyes:  Negative for photophobia, pain and visual disturbance.   Respiratory:  Negative for apnea, cough, choking, chest tightness, shortness of breath, wheezing and stridor.    Cardiovascular:  Negative for chest pain, palpitations and leg swelling.   Gastrointestinal:  Negative for abdominal distention, abdominal pain, anal bleeding, blood in stool, constipation, diarrhea, nausea, rectal pain and vomiting.   Endocrine: Negative for cold intolerance and heat intolerance.   Genitourinary:  Negative for decreased urine volume, difficulty urinating, dysuria, flank pain, frequency, hematuria and urgency.   Musculoskeletal:  Negative for arthralgias, back pain, gait problem, joint swelling, myalgias and neck pain.   Skin:  Negative for color change, pallor, rash and wound.   Allergic/Immunologic: Negative for immunocompromised state.   Neurological:  Negative for dizziness, syncope, facial asymmetry, speech difficulty, weakness, light-headedness, numbness and headaches.   Hematological:  Negative for adenopathy. Does not bruise/bleed easily.   Psychiatric/Behavioral:  Negative for agitation, behavioral problems and confusion.      Past Medical History:   Diagnosis Date    Abdominal  pain     Cancer (CMS HCC)     Chest pain     Chronic lung disease     Coronary artery disease     Dyslipidemia     Essential hypertension     Heart disease     History of percutaneous coronary intervention     History of stress test     Mixed hyperlipidemia     Unspecified chronic bronchitis      Past Surgical History:   Procedure Laterality Date    CARDIAC CATHETERIZATION      CORONARY ANGIOPLASTY      CORONARY ARTERY STENT PLACEMENT      HX BACK SURGERY      HX CORONARY ARTERY BYPASS GRAFT      HX MASTECTOMY, SIMPLE Bilateral     HX TUBAL LIGATION      NECK SURGERY       Social History     Socioeconomic History    Marital status: Widowed     Spouse name: Not on file    Number of children: Not on file    Years of education: Not on file    Highest education level: Not on file   Occupational History    Not on file   Tobacco Use    Smoking status: Every Day     Current packs/day: 0.50     Types: Cigarettes    Smokeless tobacco: Never   Vaping Use    Vaping status: Never Used   Substance and Sexual Activity    Alcohol use: Yes     Comment: occasional    Drug use: Yes  Types: Marijuana    Sexual activity: Not on file   Other Topics Concern    Not on file   Social History Narrative    Not on file     Social Determinants of Health     Financial Resource Strain: Not on file   Transportation Needs: Not on file   Social Connections: Low Risk (06/14/2024)    Social Connections     SDOH Social Isolation: 5 or more times a week   Intimate Partner Violence: Not on file   Housing Stability: Not on file      Family Medical History:       Problem Relation (Age of Onset)    Arthritis Mother, Maternal Grandmother    Cancer Father, Other    Heart Attack Father, Other    Heart Disease Brother, Other    Hypertension (High Blood Pressure) Mother, Father, Other    Pacemaker Mother          Vital Signs:  Temp  Avg: 36.8 C (98.2 F)  Min: 36.7 C (98.1 F)  Max: 37 C (98.6 F)    Pulse  Avg: 86.3  Min: 81  Max: 92 BP  Min: 96/70  Max:  143/106   Resp  Avg: 16  Min: 14  Max: 18 SpO2  Avg: 95.2 %  Min: 93 %  Max: 99 %          Input/Output    Intake/Output Summary (Last 24 hours) at 06/16/2024 1331  Last data filed at 06/16/2024 1300  Gross per 24 hour   Intake 796 ml   Output --   Net 796 ml    I/O last shift:  09/07 0700 - 09/07 1859  In: 240 [P.O.:240]  Out: -      LABS:   Results for orders placed or performed during the hospital encounter of 06/14/24 (from the past 24 hours)   ALPHA FETOPROTEIN (AFP) TUMOR MARKER   Result Value Ref Range    AFP TUMOR MARKER 3.7 0.0 - 8.3 ng/mL   CARCINOEMBRYONIC ANTIGEN   Result Value Ref Range    CEA 5.5 (H) 0.0 - 4.7 ng/mL     IMAGES:  Results for orders placed or performed during the hospital encounter of 06/14/24 (from the past 72 hours)   XR CHEST AP     Status: Abnormal    Narrative    Jailani JEAN Trabucco    XR CHEST AP, 06/14/2024 1:25 PM    INDICATION:  dyspnea, respiratory illness   Additional History:  dyspnea, respiratory illness    TECHNIQUE:  Single portable frontal view of the chest.  1 views/1 images submitted for interpretation.  COMPARISON: 09/14/2023.  ___________________________________  FINDINGS:    The heart is within normal limits for size. Median sternotomy wires, inferior surgical fusion, and numerous bilateral breast surgical clips are stable. The right hilum is enlarged. No effusion, infiltrate, or pneumothorax is seen. Bilateral apical pulmonary scar is stable. There is mild dextrocurvature of the thoracic spine.  ___________________________________    Impression    Right hilar enlargement may represent mass, adenopathy or vascular enlargement. Enhanced CT chest would be the test of choice for further evaluation.      A Significant Orange actionable finding has been sent via the [employer] Actionable Findings application on 06/14/2024 1:55 PM, Message ID [id number]. Receipt of this communication will be communicated to [facility] RADIOLOGY STAFF or responsible provider and will be documented in  [employer] Actionable Findings System upon receiving the  acknowledgement.      Radiologist location ID: [id number]     US  GALLBLADDER     Status: None    Narrative    Tymia JEAN Bruney    US  GALLBLADDER performed on 06/14/2024 1:52 PM.    INDICATION:  prior CT questioned cholelithiasis, later ruled out by repeat CT and US , presents back today with nausea/vomiting/RUQ pain, mild transaminitis   Additional History:  abdominal pain; nausea and vomiting    TECHNIQUE:  Transabdominal grayscale sonogram of the gallbladder and common bile duct.      COMPARISON:  CT abdomen/pelvis 01/12/2024.  ___________________________________  FINDINGS:    The common bile duct measures up to 7.6 mm in caliber, mildly enlarged for age but similar to 01/12/2024.  Gallbladder wall thickness: 1.9 mm    The gallbladder is unremarkable in size and wall thickness, without pericholecystic fluid or cholelithiasis.  ___________________________________    Impression    Unremarkable gallbladder.  Mild chronic common bile duct dilation.      Radiologist location ID: [id number]     CT BRAIN WO IV CONTRAST     Status: None    Narrative    Taliyah JEAN Trompeter    CT BRAIN WO IV CONTRAST  06/14/2024 2:48 PM    INDICATION:  generalized malaise, generally unwell, n/v, chest mass on XR imaging, further character/staging  Body aches    TECHNIQUE: Head CT performed without IV contrast. Dose modulation, automated exposure control, and/or iterative reconstruction were used for dose reduction.    COMPARISON:  11/08/2006.    FINDINGS:  No evidence of acute intracranial hemorrhage, mass, or acute infarct.    No midline shift.  CSF spaces are relatively symmetric.   Numerous white matter hypodensities.  Well aerated paranasal sinuses and mastoids.          Impression    No evidence of acute intracranial disease.    Findings compatible with chronic small vessel ischemic changes.        Note that in acute stroke, findings may not be visible on CT for the first 48 hours.        Radiologist location ID: [id number]     CT CHEST ABDOMEN PELVIS W IV CONTRAST     Status: None    Narrative    Jamiesha JEAN Bunney    CT CHEST ABDOMEN PELVIS W IV CONTRAST performed on 06/14/2024 2:55 PM.    INDICATION:  generalized malaise, generally unwell, n/v, chest mass on XR imaging, further character/staging   Additional History:  Body aches, vomiting, abdominal pain    TECHNIQUE:  CT chest, abdomen, and pelvis with axial, coronal, and sagittal multiplanar reformations, performed with IV contrast. Dose modulation, automated exposure control, and/or iterative reconstruction were used for dose reduction.    CONTRAST:  75 mL Isovue  370 IV    COMPARISON:  Same date CXR. CT abdomen/pelvis 01/12/2024. CT chest 12/28/2023..  ___________________________________  FINDINGS:    Chest:  * Right lower cervical and supraclavicular adenopathy including a 2 cm supraclavicular node. Multistation mediastinal adenopathy involving the prevascular, bilateral paratracheal, and subcarinal stations, for example a 2 cm right lower paratracheal node and 2.8 cm subcarinal node. Right hilar adenopathy measuring up to 1.9 cm.  * No aortic aneurysm or central pulmonary embolism. Atherosclerosis with CAD post CABG.  * No pleural effusion.  * Emphysema. New branching expansile changes along the bronchovascular bundles of the right lower lobe with local nodularity, malignancy until otherwise proven. Otherwise no focal consolidation, pulmonary edema or  mass is identified. Similar apical right upper lobe scarring.  * Patent central airways. Proximal right bronchial narrowing through the level of the mediastinal adenopathy.  * No aggressive osseous lesion is identified. Degenerative disc disease. Postsurgical changes in the bilateral breasts with scattered surgical clips.    Abdomen/pelvis:  * Multiple new indistinct subcentimeter hypodensities in the right hemiliver suspicious for metastasis.  * Normal caliber gallbladder and bile ducts.  * No  splenomegaly.  * No pancreatic inflammation or ductal dilation.  * No adrenal nodule.  * Bilateral nephrolithiasis an small renal cysts. No hydronephrosis or identified mass.  * Normal caliber bowel without overt inflammatory changes. Colonic diverticulosis.  * No free fluid or gas, bulky adenopathy or aortic aneurysm. Advanced atherosclerosis.  * Unremarkable uterus, adnexa and urinary bladder.  * No aggressive osseous lesion.    ___________________________________    Impression    Chest:  1. Right lower cervical, supraclavicular, mediastinal and right hilar adenopathy compatible with malignancy.  2. New expansile soft tissue thickening along the bronchovascular bundles of the right lower lobe, considered malignancy until otherwise proven.  3. Emphysema.    Abdomen/pelvis:  1. New subcentimeter hypoechoic hepatic lesions, presumably metastatic.  2. Additional chronic and incidental findings as above.    PET/CT may be of diagnostic benefit.            Radiologist location ID: [id number]       Physical Exam:  Physical Exam  Vitals reviewed.   Constitutional:       Appearance: Normal appearance. She is normal weight.   HENT:      Head: Normocephalic.      Nose: Nose normal.      Mouth/Throat:      Pharynx: Oropharynx is clear.   Eyes:      Extraocular Movements: Extraocular movements intact.      Conjunctiva/sclera: Conjunctivae normal.   Cardiovascular:      Rate and Rhythm: Normal rate.      Pulses: Normal pulses.   Pulmonary:      Effort: Pulmonary effort is normal.   Abdominal:      Palpations: Abdomen is soft.   Musculoskeletal:         General: Normal range of motion.      Cervical back: Normal range of motion.   Skin:     General: Skin is dry.   Neurological:      General: No focal deficit present.      Mental Status: She is alert and oriented to person, place, and time. Mental status is at baseline.   Psychiatric:         Mood and Affect: Mood normal.         Behavior: Behavior normal.         Thought  Content: Thought content normal.         Judgment: Judgment normal.       Problem List:  Active Hospital Problems   (*Primary Problem)    Diagnosis    *Malignant neoplasm of lower lobe of right lung (CMS HCC)    Lung mass    Simple chronic bronchitis (CMS HCC)     Chronic    Metastatic cancer to liver (CMS HCC)    Malaise    Feeling unwell    RUQ pain    History of breast cancer     Chronic    Mediastinal adenopathy    Paroxysmal atrial fibrillation (CMS HCC)     Chronic  S/P CABG x 1     Chronic    Stewart-Treves syndrome    Coronary artery disease     Chronic    Hypertension     Chronic    Hyperlipidemia     Chronic      Assessment/Plan:    68 year old female presenting with lung mass:     Metastatic cancer: Lung mass with suggested hepatic metastases as evidenced by CT CAP.  Pulmonology also following.  Patient agreeable to EBUS bronchoscopy with biopsy tomorrow, 06/17/2024.  CEA mildly elevated at 5.5.  CA-125 and AFP are normal at 7.5 and 3.7 respectively.  We will continue to follow.    History of breast cancer: Does endorse previous breast CA history with subsequent bilateral mastectomy - no hx of CRT.  Does not currently follow with Oncology on outpatient basis.    Paroxysmal atrial fib:  Currently NSR.  On beta-blocker.    CAD:  History of CABG x1.  On cardioprotective medication 81 mg ASA daily.    Hyperlipidemia: On statin.    Hypertension: Managed with antihypertensives.    Tobacco dependence: Advised cessation.  Counseled on harmful side effects.      Attending note:  I have independently seen and examined the patient on the same date of service as our mid-level.  I agree with the plan of care as documented by my mid-level.  I have personally reviewed  the pertinent medications, labs  and radiographic studies  as documented in my mid-level note.  The case was discussed in detail with the midlevel provider.  I agree with the above note.  Some changes and additions were made.  I spent more than 50% of  the total encounter time for the care of this patient and documentation.      Norleen Amen, MD

## 2024-06-16 NOTE — Assessment & Plan Note (Signed)
Stable; continue supportive care 

## 2024-06-16 NOTE — Progress Notes (Signed)
 [facility]  Pulmonary Consult Initial    [name], [name], 68 y.o. female  Encounter Start Date:  06/14/2024  Inpatient Admission Date:  06/14/2024  Date of service: 06/16/2024  Date of Birth:  Jan 20, 1956    Hospital Day:  LOS: 2 days     Service: Pulmonary    HPI/Discussion:  [name] is a 68 y.o., White female who presents with complaints of ear and flank pain radiating to the abdomen and chest.  Workup in ED showed unremarkable labs but CT of the chest showed right lower lobe mass and right adenopathy, suspicious for malignancy prompting our consult. She does has a history of breast cancer at [facility]. She has reported a 10 lb weight loss.  Denies any respiratory symptoms but is a 1 pack per day smoker.  Does report pain radiating to abdomen and chest.  Denies nausea vomiting.    9/7:  Awake and alert.  Remains on room air. Anxious for biopsy. Denies nausea vomiting.      Past Medical History:   Diagnosis Date    Abdominal pain     Cancer (CMS HCC)     Chest pain     Chronic lung disease     Coronary artery disease     Dyslipidemia     Essential hypertension     Heart disease     History of percutaneous coronary intervention     History of stress test     Mixed hyperlipidemia     Unspecified chronic bronchitis      Past Surgical History:   Procedure Laterality Date    CARDIAC CATHETERIZATION      CORONARY ANGIOPLASTY      CORONARY ARTERY STENT PLACEMENT      HX BACK SURGERY      HX CORONARY ARTERY BYPASS GRAFT      HX MASTECTOMY, SIMPLE Bilateral     HX TUBAL LIGATION      NECK SURGERY       Medications Prior to Admission       Prescriptions    anastrozole  (ARIMIDEX ) 1 mg Oral Tablet    Take 1 Tablet (1 mg total) by mouth Daily    aspirin  81 mg Oral Tablet, Chewable    Chew 1 Tablet (81 mg total) Once a day    atorvastatin  (LIPITOR) 40 mg Oral Tablet    Take 1 Tablet (40 mg total) by mouth Daily    cholecalciferol , vitamin D3, 25 mcg (1,000 unit) Oral Tablet    Take 1  Tablet (1,000 Units total) by mouth Daily    clonazePAM  (KLONOPIN ) 0.5 mg Oral Tablet    Take 1 Tablet (0.5 mg total) by mouth Twice per day as needed for Other (ANXIETY)    DULoxetine  (CYMBALTA  DR) 60 mg Oral Capsule, Delayed Release(E.C.)    Take 1 Capsule (60 mg total) by mouth Daily    fluticasone  propionate (FLONASE ) 50 mcg/actuation Nasal Spray, Suspension    1 Spray Once per day as needed for Other (CONGESTION)    gabapentin  (NEURONTIN ) 400 mg Oral Capsule    Take 1 Capsule (400 mg total) by mouth Daily    losartan  (COZAAR ) 25 mg Oral Tablet    Take 1 Tablet (25 mg total) by mouth Daily    metoprolol  tartrate (LOPRESSOR ) 25 mg Oral Tablet    Take 1 Tablet (25 mg total) by mouth Twice daily    MV,Ca,Ir,Min-FA-Lut-Lyco-HC153 3-133-33 mg-mcg-mcg Oral Capsule    Take  1 Capsule by mouth Daily    omega-3 fatty acid (LOVAZA ) 1 gram Oral Capsule    Take 1 Capsule (1 g total) by mouth Daily    pantoprazole  (PROTONIX ) 40 mg Oral Tablet, Delayed Release (E.C.)    Take 1 Tablet (40 mg total) by mouth Daily    promethazine  (PHENERGAN ) 25 mg Oral Tablet    Take 1 Tablet (25 mg total) by mouth Every 8 hours as needed for Nausea/Vomiting    sertraline  (ZOLOFT ) 100 mg Oral Tablet    Take 1 Tablet (100 mg total) by mouth Daily    traZODone  (DESYREL ) 150 mg Oral Tablet    Take 1 Tablet (150 mg total) by mouth Every night          acetaminophen  (TYLENOL ) tablet, 325 mg, Oral, Q4H PRN  aspirin  chewable tablet 81 mg, 81 mg, Oral, Daily  atorvastatin  (LIPITOR) tablet, 40 mg, Oral, Daily  cholecalciferol  (VITAMIN D3) 1000 unit (25 mcg) tablet, 1,000 Units, Oral, Daily  clonazePAM  (klonoPIN ) tablet, 0.5 mg, Oral, 2x/day PRN  docusate sodium  (COLACE) capsule, 100 mg, Oral, 2x/day PRN  DULoxetine  (CYMBALTA ) delayed release capsule, 60 mg, Oral, Daily  gabapentin  (NEURONTIN ) capsule, 400 mg, Oral, Daily  heparin  5,000 unit/mL injection, 5,000 Units, Subcutaneous, Q8HRS  HYDROcodone -acetaminophen  (NORCO) 7.5 mg-325 mg per tablet, 1  Tablet, Oral, Q4H PRN  losartan  (COZAAR ) tablet, 25 mg, Oral, Daily  metoprolol  tartrate (LOPRESSOR ) tablet, 25 mg, Oral, 2x/day  morphine  4 mg/mL injection, 3 mg, Intravenous, Q4H PRN  omega-3 fatty acids (LOVAZA ) capsule, 1 g, Oral, Daily  ondansetron  (ZOFRAN ) 2 mg/mL injection, 4 mg, Intravenous, Q8H PRN  pantoprazole  (PROTONIX ) delayed release tablet, 40 mg, Oral, Daily  sertraline  (ZOLOFT ) tablet, 100 mg, Oral, Daily  traZODone  (DESYREL ) tablet 150 mg, 150 mg, Oral, NIGHTLY      Allergies[1]  Social History     Tobacco Use    Smoking status: Every Day     Current packs/day: 0.50     Types: Cigarettes    Smokeless tobacco: Never   Substance Use Topics    Alcohol use: Yes     Comment: occasional       Family History:    Family Medical History:       Problem Relation (Age of Onset)    Arthritis Mother, Maternal Grandmother    Cancer Father, Other    Heart Attack Father, Other    Heart Disease Brother, Other    Hypertension (High Blood Pressure) Mother, Father, Other    Pacemaker Mother          ROS:   Other than ROS in the HPI, all other review of systems were negative except for:  Review of systems:   Constitutional:  No fevers, chills, sweats, malaise, or weight change.  HEENT:  No hair loss, facial trauma, nasal discharge, sore throat, or earache.  Eyes:  No acute visual change, discomfort, or discoloration.  Cardiovascular:  No chest pain, palpitation, peripheral edema, or orthopnea.   Respiratory:  Denies shortness of breath, coughing, wheezing, or hemoptysis.  Gastrointestinal:  No nausea, vomiting, constipation, abdominal pain, or diarrhea.  Musculoskeletal:  No focal redness, swelling, weakness, or edema.  Genitourinary:  No dysuria, hematuria, or urinary frequency.    EXAM:  Temperature: 36.8 C (98.2 F)  Heart Rate: 81  BP (Non-Invasive): 134/86  Respiratory Rate: 16  SpO2: 95 %  Constitutional: appears in good health, comfortable, appears older than stated age, and no distress  Eyes: Pupils equal and  round, reactive  to light and accomodation. , Sclera non-icteric. , normal findings: lids and lashes normal and conjunctivae and sclerae normal  ENT: Head atraumatic and normocephalic, External Ears: normal pinnae shape and position and no signs of inflammation, Nose without erythema. , Mouth mucous membranes moist. , No oral lesions.   Neck: no thyromegaly or lymphadenopathy, supple, symmetrical, trachea midline, and no adenopathy  Respiratory: Breathing nonlabored, Clear to auscultation bilaterally. , normal percussion bilaterally  Cardiovascular: regular rate and rhythm, S1, S2 normal, no murmur, click, rub or gallop  Gastrointestinal: non-distended, Soft, non-tender, Bowel sounds normal, No hepatosplenomegaly, No masses  Musculoskeletal: 5/5 in both upper and lower extremities, normal, and normal  Integumentary:  Skin warm and dry and Skin color, texture, turgor normal. No rashes or lesions  Neurologic: Grossly normal. Alert and oriented x3, CN II - XII grossly intact , No tremor  Lymphatic/Immunologic/Hematologic: No lymphadenopathy  Psychiatric: Normal affect, behavior, memory, thought content, judgement, and speech.    Labs:  Lab Results Today:    Results for orders placed or performed during the hospital encounter of 06/14/24 (from the past 24 hours)   ALPHA FETOPROTEIN (AFP) TUMOR MARKER   Result Value Ref Range    AFP TUMOR MARKER 3.7 0.0 - 8.3 ng/mL   CARCINOEMBRYONIC ANTIGEN   Result Value Ref Range    CEA 5.5 (H) 0.0 - 4.7 ng/mL       Imaging Studies:  CT CHEST ABDOMEN PELVIS W IV CONTRAST  Result Date: 06/14/2024  Impression Chest: 1. Right lower cervical, supraclavicular, mediastinal and right hilar adenopathy compatible with malignancy. 2. New expansile soft tissue thickening along the bronchovascular bundles of the right lower lobe, considered malignancy until otherwise proven. 3. Emphysema. Abdomen/pelvis: 1. New subcentimeter hypoechoic hepatic lesions, presumably metastatic. 2. Additional chronic  and incidental findings as above. PET/CT may be of diagnostic benefit. Radiologist location ID: [id number]     CT BRAIN WO IV CONTRAST  Result Date: 06/14/2024  Impression No evidence of acute intracranial disease. Findings compatible with chronic small vessel ischemic changes. Note that in acute stroke, findings may not be visible on CT for the first 48 hours. Radiologist location ID: [id number]     US  GALLBLADDER  Result Date: 06/14/2024  Impression Unremarkable gallbladder. Mild chronic common bile duct dilation. Radiologist location ID: [id number]     XR CHEST AP  Result Date: 06/14/2024  Impression Right hilar enlargement may represent mass, adenopathy or vascular enlargement. Enhanced CT chest would be the test of choice for further evaluation. A Significant Orange actionable finding has been sent via the [employer] Actionable Findings application on 06/14/2024 1:55 PM, Message ID [id number]. Receipt of this communication will be communicated to [facility] RADIOLOGY STAFF or responsible provider and will be documented in [employer] Actionable Findings System upon receiving the acknowledgement. Radiologist location ID: [id number]       Assessment/Recommendations:   Active Hospital Problems    Diagnosis    Primary Problem: Malignant neoplasm of lower lobe of right lung (CMS HCC)    Simple chronic bronchitis (CMS HCC)    Metastatic cancer to liver (CMS HCC)    Malaise    Feeling unwell    RUQ pain    History of breast cancer    Paroxysmal atrial fibrillation (CMS HCC)    S/P CABG x 1    Stewart-Treves syndrome    Coronary artery disease    Hypertension    Hyperlipidemia       Patient seen and examined  Charts/labs/imaging personally reviewed.  Patient presented with flank and chest pain radiating to abdomen,  CT scan showed right lower cervical and supraclavicular adenopathy including O2 cm supraclavicular nodule attestation mediastinal adenopathy.  We will plan for EBUS bronchoscopy tomorrow.   Patient  agreeable.  NPO at midnight.  Oncology following.   Currently on room air.  Encourages smoking cessation.    Patient was personally seen and examined.  Plan was discussed with my attending, his addendum to follow.     Allean Brought, FNP-BC  06/16/2024 11:55          [1] No Known Allergies

## 2024-06-16 NOTE — Assessment & Plan Note (Signed)
 Secondary to liver mets

## 2024-06-16 NOTE — Progress Notes (Signed)
 [place] [facility]  Progress Note    [name], [name], 68 y.o. female  Date of Birth:  12-18-1955  Encounter Start Date:  06/14/2024  Inpatient Admission Date: 06/14/2024  Date of service: 06/16/2024  Status:       Subjective:  Patient with lots of questions regarding treatment options, prognosis/outlook --- discussed that we would not have any definitive answers until we had tissue diagnosis.  Discussed need for PET scan after discharge.  She is for a bronchoscopy tomorrow with biopsy.  She is somewhat tearful today during my visit when thinking about her future.  We did discuss resuscitation status and her wishes - does not want to be on ventilator/breathing machine. States she has paperwork that states this and will try to get a copy for us  to scan into our system.  Encouraged her to discuss these wishes candidly with her daughters.     Vital Signs:  Filed Vitals:    06/16/24 1151 06/16/24 1526 06/16/24 1546 06/16/24 1900   BP: 134/86 (!) 135/95  136/87   Pulse: 81 80  78   Resp: 16 15 16 16    Temp: 36.8 C (98.2 F) 36.7 C (98.1 F)  36.8 C (98.2 F)   SpO2: 95% 96%  96%        Wt Readings from Last 3 Encounters:   06/14/24 59 kg (130 lb)   01/23/24 63.3 kg (139 lb 9.6 oz)   01/12/24 63 kg (139 lb)     Body mass index is 24.56 kg/m.    Physical Exam:  PATIENT SEEN AND EXAMINED AT BEDSIDE. VITALS STABLE.  CONSTITUTIONAL: ALERT, AWAKE, ORIENTED, COOPERATIVE, NO ACUTE DISTRESS  CV: S1 AND S2 HEARD, NO MURMUR PRESENT  LUNGS: CTAB, NO WHEEZING, NO RHONCHI, NO CRACKLES  ABDOMEN: SOFT NT/ND  EXTREMITIES: NO EDEMA, NO ULCERS, WARM AND DRY  SKIN: WARM, NO RASH, NO JAUNDICE              Studies:  Results for orders placed or performed during the hospital encounter of 06/14/24 (from the past 24 hours)   ALPHA FETOPROTEIN (AFP) TUMOR MARKER   Result Value Ref Range    AFP TUMOR MARKER 3.7 0.0 - 8.3 ng/mL   CARCINOEMBRYONIC ANTIGEN   Result Value Ref Range    CEA 5.5 (H) 0.0 - 4.7 ng/mL     No results  found.     CT CHEST ABDOMEN PELVIS W IV CONTRAST   Final Result      Chest:   1. Right lower cervical, supraclavicular, mediastinal and right hilar adenopathy compatible with malignancy.   2. New expansile soft tissue thickening along the bronchovascular bundles of the right lower lobe, considered malignancy until otherwise proven.   3. Emphysema.      Abdomen/pelvis:   1. New subcentimeter hypoechoic hepatic lesions, presumably metastatic.   2. Additional chronic and incidental findings as above.      PET/CT may be of diagnostic benefit.                  Radiologist location ID: [id number]         CT BRAIN WO IV CONTRAST   Final Result      No evidence of acute intracranial disease.      Findings compatible with chronic small vessel ischemic changes.            Note that in acute stroke, findings may not be visible on CT for the first 48 hours.  Radiologist location ID: [id number]         US  GALLBLADDER   Final Result   Unremarkable gallbladder.   Mild chronic common bile duct dilation.         Radiologist location ID: [id number]         XR CHEST AP   Final Result   Abnormal      Right hilar enlargement may represent mass, adenopathy or vascular enlargement. Enhanced CT chest would be the test of choice for further evaluation.         A Significant Orange actionable finding has been sent via the [employer] Actionable Findings application on 06/14/2024 1:55 PM, Message ID [id number]. Receipt of this communication will be communicated to [facility] RADIOLOGY STAFF or responsible provider and will be documented in [employer]Connect Actionable Findings System upon receiving the acknowledgement.         Radiologist location ID: [id number]               MEDICATIONS:  acetaminophen  (TYLENOL ) tablet, 325 mg, Oral, Q4H PRN  [Held by provider] aspirin  chewable tablet 81 mg, 81 mg, Oral, Daily  atorvastatin  (LIPITOR) tablet, 40 mg, Oral, Daily  cholecalciferol  (VITAMIN D3) 1000 unit (25 mcg) tablet, 1,000 Units, Oral,  Daily  clonazePAM  (klonoPIN ) tablet, 0.5 mg, Oral, 2x/day PRN  docusate sodium  (COLACE) capsule, 100 mg, Oral, 2x/day PRN  DULoxetine  (CYMBALTA ) delayed release capsule, 60 mg, Oral, Daily  gabapentin  (NEURONTIN ) capsule, 400 mg, Oral, Daily  [Held by provider] heparin  5,000 unit/mL injection, 5,000 Units, Subcutaneous, Q8HRS  HYDROcodone -acetaminophen  (NORCO) 7.5 mg-325 mg per tablet, 1 Tablet, Oral, Q4H PRN  losartan  (COZAAR ) tablet, 25 mg, Oral, Daily  metoprolol  tartrate (LOPRESSOR ) tablet, 25 mg, Oral, 2x/day  morphine  4 mg/mL injection, 3 mg, Intravenous, Q4H PRN  omega-3 fatty acids (LOVAZA ) capsule, 1 g, Oral, Daily  ondansetron  (ZOFRAN ) 2 mg/mL injection, 4 mg, Intravenous, Q8H PRN  pantoprazole  (PROTONIX ) delayed release tablet, 40 mg, Oral, Daily  sertraline  (ZOLOFT ) tablet, 100 mg, Oral, Daily  traZODone  (DESYREL ) tablet 150 mg, 150 mg, Oral, NIGHTLY        Assessment & Plan  Malignant neoplasm of lower lobe of right lung (CMS HCC)  Metastatic cancer to liver (CMS HCC)  Presented with nonspecific symptoms.  During workup CT chest with soft tissue thickening in right lower lobe concerning for malignancy.  Also with right lower cervical, supraclavicular, mediastinal and right hilar adenopathy compatible with malignancy.  Consulted Oncology and pulmonology for further recommendations.  For bronch with biopsy tomorrow  Will need outpatient PET scan  Discussed we cannot formulate treatment plan until tissue diagnosis returns  Likely discharge home after biopsy tomorrow  Coronary artery disease  S/P CABG x 1  Continue metoprolol , aspirin  and atorvastatin   No chest pain or shortness for breath  Hypertension    Blood pressure acceptable.    Continue metoprolol  and losartan .    Monitor blood pressure and adjust as needed.  Hyperlipidemia    Continue atorvastatin .  Paroxysmal atrial fibrillation (CMS HCC)    Rate controlled.  Continue metoprolol .    Not on anticoagulation.      Follows up outpatient with  Cardiology.  History of breast cancer    S/p double mastectomy 2 years ago at [facility]  Simple chronic bronchitis (CMS HCC)  Stable continue supportive care  Stewart-Treves syndrome    RUQ pain  Secondary to liver mets  Lung mass    Mediastinal adenopathy  THE PATIENT has CAPACITY TO MAKE OWN MEDICAL DECISIONS AT THIS TIME    CODE STATUS: Full  DVT PROPHYLAXIS:  Hep sq      ANTICIPATED DISCHARGE DATE: Tomorrow  BARRIERS TO DISCHARGE: awaiting bronch/biopsy tomorrow  PT DISCHARGE RECOMMENDATION: n/a      MEDICAL DECISION MAKING:  HIGH COMPLEXITY      66 MINUTES WERE SPENT IN THE CARE OF THIS PATIENT.  MORE THAN HALF OF MY TIME WAS SPENT COUNSELING THE PATIENT OR FAMILY AND COORDINATING CARE FOR THE PATIENT ON FLOOR    Ami LITTIE Mohr, MD  06/16/2024 22:11

## 2024-06-16 NOTE — Assessment & Plan Note (Signed)
 S/p double mastectomy 2 years ago at [facility]

## 2024-06-16 NOTE — Assessment & Plan Note (Signed)
 Presented with nonspecific symptoms.  During workup CT chest with soft tissue thickening in right lower lobe concerning for malignancy.  Also with right lower cervical, supraclavicular, mediastinal and right hilar adenopathy compatible with malignancy.  Consulted Oncology and pulmonology for further recommendations.  For bronch with biopsy tomorrow  Will need outpatient PET scan  Discussed we cannot formulate treatment plan until tissue diagnosis returns  Likely discharge home after biopsy tomorrow

## 2024-06-16 NOTE — Assessment & Plan Note (Signed)
 Blood pressure acceptable.    Continue metoprolol  and losartan .    Monitor blood pressure and adjust as needed.

## 2024-06-16 NOTE — Care Plan (Signed)
 Required pain medication this shift-see MAR.  Scheduled for endobronchial U/S tomorrow.  NPO after MN.  Voiced understanding.  Further care per plan below.      Problem: Wound  Goal: Optimal Coping  Outcome: Ongoing (see interventions/notes)  Goal: Optimal Functional Ability  Outcome: Ongoing (see interventions/notes)  Intervention: Optimize Functional Ability  Recent Flowsheet Documentation  Taken 06/16/2024 0750 by Andrez RAMAN, RN  Activity Management: ambulated in room  Activity Assistance Provided: independent  Goal: Absence of Infection Signs and Symptoms  Outcome: Ongoing (see interventions/notes)  Intervention: Prevent or Manage Infection  Recent Flowsheet Documentation  Taken 06/16/2024 0750 by Andrez RAMAN, RN  Infection Management: aseptic technique maintained  Goal: Improved Oral Intake  Outcome: Ongoing (see interventions/notes)  Goal: Optimal Pain Control and Function  Outcome: Ongoing (see interventions/notes)  Goal: Skin Health and Integrity  Outcome: Ongoing (see interventions/notes)  Intervention: Optimize Skin Protection  Recent Flowsheet Documentation  Taken 06/16/2024 0750 by Andrez RAMAN, RN  Pressure Reduction Techniques: Mobility is maximized  Activity Management: ambulated in room  Goal: Optimal Wound Healing  Outcome: Ongoing (see interventions/notes)  Intervention: Promote Wound Healing  Recent Flowsheet Documentation  Taken 06/16/2024 0750 by Andrez RAMAN, RN  Pressure Reduction Techniques: Mobility is maximized  Activity Management: ambulated in room     Problem: Adult Inpatient Plan of Care  Goal: Plan of Care Review  Outcome: Ongoing (see interventions/notes)  Goal: Patient-Specific Goal (Individualized)  Outcome: Ongoing (see interventions/notes)  Flowsheets (Taken 06/16/2024 0750)  Individualized Care Needs: keep informed, emotional support  Anxieties, Fears or Concerns: concerned about diagnosis  Goal: Absence of Hospital-Acquired Illness or Injury  Outcome: Ongoing (see interventions/notes)  Intervention:  Identify and Manage Fall Risk  Recent Flowsheet Documentation  Taken 06/16/2024 1400 by Andrez RAMAN, RN  Safety Promotion/Fall Prevention:   activity supervised   fall prevention program maintained   nonskid shoes/slippers when out of bed   safety round/check completed  Taken 06/16/2024 1200 by Andrez RAMAN, RN  Safety Promotion/Fall Prevention:   activity supervised   fall prevention program maintained   nonskid shoes/slippers when out of bed   safety round/check completed  Taken 06/16/2024 0750 by Andrez RAMAN, RN  Safety Promotion/Fall Prevention:   activity supervised   fall prevention program maintained   nonskid shoes/slippers when out of bed   safety round/check completed  Intervention: Prevent Skin Injury  Recent Flowsheet Documentation  Taken 06/16/2024 0750 by Andrez RAMAN, RN  Skin Protection: adhesive use limited  Intervention: Prevent Infection  Recent Flowsheet Documentation  Taken 06/16/2024 0750 by Andrez RAMAN, RN  Infection Prevention: promote handwashing  Goal: Optimal Comfort and Wellbeing  Outcome: Ongoing (see interventions/notes)  Intervention: Provide Person-Centered Care  Recent Flowsheet Documentation  Taken 06/16/2024 0750 by Andrez RAMAN, RN  Trust Relationship/Rapport:   care explained   choices provided   emotional support provided  Goal: Rounds/Family Conference  Outcome: Ongoing (see interventions/notes)

## 2024-06-17 ENCOUNTER — Inpatient Hospital Stay (HOSPITAL_COMMUNITY): Admitting: Anesthesiology

## 2024-06-17 ENCOUNTER — Encounter (HOSPITAL_COMMUNITY)
Admission: EM | Disposition: A | Discharge status: Home / Self Care | Payer: Self-pay | Source: Home / Self Care | Attending: INTERNAL MEDICINE

## 2024-06-17 ENCOUNTER — Telehealth (HOSPITAL_COMMUNITY): Payer: Self-pay | Admitting: NURSE PRACTITIONER

## 2024-06-17 ENCOUNTER — Encounter (HOSPITAL_COMMUNITY): Payer: Self-pay

## 2024-06-17 ENCOUNTER — Inpatient Hospital Stay (HOSPITAL_COMMUNITY)

## 2024-06-17 DIAGNOSIS — Z0100 Encounter for examination of eyes and vision without abnormal findings: Secondary | ICD-10-CM | POA: Diagnosis not present

## 2024-06-17 DIAGNOSIS — C3490 Malignant neoplasm of unspecified part of unspecified bronchus or lung: Secondary | ICD-10-CM

## 2024-06-17 DIAGNOSIS — C779 Secondary and unspecified malignant neoplasm of lymph node, unspecified: Secondary | ICD-10-CM

## 2024-06-17 DIAGNOSIS — F17200 Nicotine dependence, unspecified, uncomplicated: Secondary | ICD-10-CM

## 2024-06-17 DIAGNOSIS — F17210 Nicotine dependence, cigarettes, uncomplicated: Secondary | ICD-10-CM

## 2024-06-17 LAB — BAL FLUID MANUAL DIFFERENTIAL
BASOPHIL %: 0 %
EOSINOPHIL %: 3 %
LYMPHOCYTE %: 51 %
MONOCYTE/MACROPHAGE %: 13 %
NEUTROPHIL %: 33 %

## 2024-06-17 LAB — CBC
HCT: 37.9 % (ref 34.8–46.0)
HGB: 11.8 g/dL (ref 11.5–16.0)
MCH: 28 pg (ref 26.0–32.0)
MCHC: 31.1 g/dL (ref 31.0–35.5)
MCV: 90 fL (ref 78.0–100.0)
MPV: 9.2 fL (ref 8.7–12.5)
PLATELETS: 185 x10ˆ3/uL (ref 150–400)
RBC: 4.21 x10ˆ6/uL (ref 3.85–5.22)
RDW-CV: 12.4 % (ref 11.5–15.5)
WBC: 6.3 x10ˆ3/uL (ref 3.7–11.0)

## 2024-06-17 LAB — BASIC METABOLIC PANEL
ANION GAP: 10 mmol/L (ref 7–18)
BUN/CREA RATIO: 16
BUN: 10 mg/dL (ref 8–23)
CALCIUM: 9.6 mg/dL (ref 8.3–10.7)
CHLORIDE: 108 mmol/L — ABNORMAL HIGH (ref 96–106)
CO2 TOTAL: 24 mmol/L (ref 22–30)
CREATININE: 0.62 mg/dL (ref 0.50–0.90)
ESTIMATED GFR: >90 mL/min/1.73mˆ2 (ref >90)
GLUCOSE: 102 mg/dL (ref 74–109)
POTASSIUM: 4.1 mmol/L (ref 3.2–5.0)
SODIUM: 142 mmol/L (ref 133–144)

## 2024-06-17 LAB — BAL FLUID COUNT

## 2024-06-17 LAB — PHOSPHORUS: PHOSPHORUS: 3.6 mg/dL (ref 2.5–4.5)

## 2024-06-17 LAB — MAGNESIUM: MAGNESIUM: 1.5 mg/dL — ABNORMAL LOW (ref 1.6–2.4)

## 2024-06-17 SURGERY — ULTRASOUND ENDOBRONCHIAL
Anesthesia: General | Wound class: Clean Contaminated Wounds-The respiratory, GI, Genital, or urinary

## 2024-06-17 MED ORDER — MEPERIDINE (PF) 25 MG/ML INJECTION SYRINGE
12.5000 mg | INJECTION | INTRAMUSCULAR | Status: DC | PRN
Start: 2024-06-17 — End: 2024-06-17

## 2024-06-17 MED ORDER — MAGNESIUM SULFATE 4 GRAM/100 ML (4 %) IN WATER INTRAVENOUS PIGGYBACK
4.0000 g | INJECTION | Freq: Once | INTRAVENOUS | Status: AC
Start: 2024-06-17 — End: 2024-06-17
  Administered 2024-06-17: 0 g via INTRAVENOUS
  Administered 2024-06-17: 4 g via INTRAVENOUS
  Filled 2024-06-17: qty 100

## 2024-06-17 MED ORDER — LIDOCAINE HCL 20 MG/ML (2 %) INJECTION SOLUTION
Freq: Once | INTRAMUSCULAR | Status: DC | PRN
Start: 2024-06-17 — End: 2024-06-17
  Administered 2024-06-17: 4 mL via NASAL

## 2024-06-17 MED ORDER — HYDROMORPHONE 0.5 MG/0.5 ML INJECTION SYRINGE
0.2500 mg | INJECTION | INTRAMUSCULAR | Status: DC | PRN
Start: 2024-06-17 — End: 2024-06-17

## 2024-06-17 MED ORDER — DEXAMETHASONE SODIUM PHOSPHATE 10 MG/ML INJECTION SOLUTION
Freq: Once | INTRAMUSCULAR | Status: DC | PRN
Start: 2024-06-17 — End: 2024-06-17
  Administered 2024-06-17: 8 mg via INTRAVENOUS

## 2024-06-17 MED ORDER — OXYCODONE-ACETAMINOPHEN 7.5 MG-325 MG TABLET
1.0000 | ORAL_TABLET | ORAL | 0 refills | Status: DC | PRN
Start: 2024-06-17 — End: 2024-06-28

## 2024-06-17 MED ORDER — OXYCODONE-ACETAMINOPHEN 5 MG-325 MG TABLET
1.0000 | ORAL_TABLET | Freq: Once | ORAL | Status: DC | PRN
Start: 2024-06-17 — End: 2024-06-17

## 2024-06-17 MED ORDER — GABAPENTIN 400 MG CAPSULE
400.0000 mg | ORAL_CAPSULE | Freq: Three times a day (TID) | ORAL | 0 refills | Status: AC
Start: 2024-06-17 — End: ?

## 2024-06-17 MED ORDER — EPHEDRINE SULFATE 50 MG/ML INTRAVENOUS SOLUTION
Freq: Once | INTRAVENOUS | Status: DC | PRN
Start: 2024-06-17 — End: 2024-06-17
  Administered 2024-06-17: 10 mg via INTRAVENOUS

## 2024-06-17 MED ORDER — IPRATROPIUM 0.5 MG-ALBUTEROL 3 MG (2.5 MG BASE)/3 ML NEBULIZATION SOLN
3.0000 mL | INHALATION_SOLUTION | Freq: Once | RESPIRATORY_TRACT | Status: DC | PRN
Start: 2024-06-17 — End: 2024-06-17

## 2024-06-17 MED ORDER — LIDOCAINE 2 % MUCOSAL JELLY IN APPLICATOR
Status: AC
Start: 2024-06-17 — End: 2024-06-17
  Filled 2024-06-17: qty 10

## 2024-06-17 MED ORDER — GABAPENTIN 400 MG CAPSULE
400.0000 mg | ORAL_CAPSULE | Freq: Three times a day (TID) | ORAL | Status: DC
Start: 2024-06-17 — End: 2024-06-17

## 2024-06-17 MED ORDER — PROPOFOL 10 MG/ML INTRAVENOUS EMULSION
INTRAVENOUS | Status: AC
Start: 2024-06-17 — End: 2024-06-17
  Filled 2024-06-17: qty 50

## 2024-06-17 MED ORDER — HYDRALAZINE 20 MG/ML INJECTION SOLUTION
5.0000 mg | Freq: Once | INTRAMUSCULAR | Status: DC
Start: 2024-06-17 — End: 2024-06-17

## 2024-06-17 MED ORDER — HYDROMORPHONE 1 MG/ML INJECTION WRAPPER
0.5000 mg | INJECTION | INTRAMUSCULAR | Status: DC | PRN
Start: 2024-06-17 — End: 2024-06-17

## 2024-06-17 MED ORDER — ALBUTEROL SULFATE 2.5 MG/3 ML (0.083 %) SOLUTION FOR NEBULIZATION
2.5000 mg | INHALATION_SOLUTION | Freq: Once | RESPIRATORY_TRACT | Status: DC | PRN
Start: 2024-06-17 — End: 2024-06-17

## 2024-06-17 MED ORDER — LABETALOL 20 MG/4 ML (5 MG/ML) INTRAVENOUS SYRINGE
5.0000 mg | INJECTION | Freq: Once | INTRAVENOUS | Status: DC | PRN
Start: 2024-06-17 — End: 2024-06-17

## 2024-06-17 MED ORDER — PROMETHAZINE 25 MG TABLET
25.0000 mg | ORAL_TABLET | Freq: Three times a day (TID) | ORAL | 0 refills | Status: DC | PRN
Start: 2024-06-17 — End: 2024-07-04

## 2024-06-17 MED ORDER — LACTATED RINGERS INTRAVENOUS SOLUTION
INTRAVENOUS | Status: DC | PRN
Start: 2024-06-17 — End: 2024-06-17

## 2024-06-17 MED ORDER — LACTATED RINGERS INTRAVENOUS SOLUTION
INTRAVENOUS | Status: DC
Start: 2024-06-17 — End: 2024-06-17

## 2024-06-17 MED ORDER — ONDANSETRON HCL (PF) 4 MG/2 ML INJECTION SOLUTION
4.0000 mg | Freq: Once | INTRAMUSCULAR | Status: DC | PRN
Start: 2024-06-17 — End: 2024-06-17

## 2024-06-17 MED ORDER — REMIFENTANIL 50 MCG/ML INFUSION - FOR ANES
INTRAVENOUS | Status: DC | PRN
Start: 2024-06-17 — End: 2024-06-17
  Administered 2024-06-17: .125 ug/kg/min via INTRAVENOUS
  Administered 2024-06-17: 0 ug/kg/min via INTRAVENOUS

## 2024-06-17 MED ORDER — LIDOCAINE (PF) 100 MG/5 ML (2 %) INTRAVENOUS SYRINGE
INJECTION | Freq: Once | INTRAVENOUS | Status: DC | PRN
Start: 2024-06-17 — End: 2024-06-17
  Administered 2024-06-17: 100 mg via INTRAVENOUS

## 2024-06-17 MED ORDER — PHENYLEPHRINE 1 MG/10 ML (100 MCG/ML) IN 0.9 % SOD.CHLORIDE IV SYRINGE
INJECTION | INTRAVENOUS | Status: AC
Start: 2024-06-17 — End: 2024-06-17
  Filled 2024-06-17: qty 10

## 2024-06-17 MED ORDER — PHENYLEPHRINE 1 MG/10 ML (100 MCG/ML) IN 0.9 % SOD.CHLORIDE IV SYRINGE
INJECTION | Freq: Once | INTRAVENOUS | Status: DC | PRN
Start: 2024-06-17 — End: 2024-06-17
  Administered 2024-06-17 (×2): 100 ug via INTRAVENOUS

## 2024-06-17 MED ORDER — ONDANSETRON HCL (PF) 4 MG/2 ML INJECTION SOLUTION
Freq: Once | INTRAMUSCULAR | Status: DC | PRN
Start: 2024-06-17 — End: 2024-06-17
  Administered 2024-06-17: 4 mg via INTRAVENOUS

## 2024-06-17 MED ORDER — LIDOCAINE 2 % MUCOSAL JELLY IN APPLICATOR
Freq: Once | Status: DC | PRN
Start: 2024-06-17 — End: 2024-06-17
  Administered 2024-06-17: 10 mL via ENDOTRACHEOPULMONARY

## 2024-06-17 MED ORDER — PROCHLORPERAZINE EDISYLATE 10 MG/2 ML (5 MG/ML) INJECTION SOLUTION
5.0000 mg | Freq: Once | INTRAMUSCULAR | Status: DC | PRN
Start: 2024-06-17 — End: 2024-06-17

## 2024-06-17 MED ORDER — PROPOFOL 10 MG/ML IV BOLUS
INJECTION | Freq: Once | INTRAVENOUS | Status: DC | PRN
Start: 2024-06-17 — End: 2024-06-17
  Administered 2024-06-17: 150 mg via INTRAVENOUS

## 2024-06-17 MED ORDER — PROPOFOL 10 MG/ML INTRAVENOUS EMULSION
INTRAVENOUS | Status: DC | PRN
Start: 2024-06-17 — End: 2024-06-17
  Administered 2024-06-17: 100 ug/kg/min via INTRAVENOUS
  Administered 2024-06-17: 0 ug/kg/min via INTRAVENOUS

## 2024-06-17 MED ORDER — AMISULPRIDE 5 MG/2 ML (2.5 MG/ML) INTRAVENOUS SOLUTION
10.0000 mg | Freq: Once | INTRAVENOUS | Status: DC | PRN
Start: 2024-06-17 — End: 2024-06-17

## 2024-06-17 SURGICAL SUPPLY — 5 items
ADAPTER BRONCHSCP BLU 15MM PNEUPAC 2 AXIS SWVL FO O2 STRL LF  DISP (ENDOSCOPIC SUPPLIES) ×1 IMPLANT
CANNULA VIAL ANTICORE ANTIPARTICULATE PLASTIC MONOJECT SMARTIP DISP STRL LF  16GA (MEDICATIONS/SOLUTIONS) ×1 IMPLANT
NEEDLE ASP 700MM 22GA 1.8MM VIZISHOT ECHGN DIMPLE TIP 5 SYRG 5 VALVE 40MM STRL DISP (ENDOSCOPIC SUPPLIES) ×1 IMPLANT
TUBING SUCT CLR 100FT .25IN MEDIVAC MXGR NCDTV MALE TO MALE CONN NONST LF  DISP (MED SURG SUPPLIES) ×1 IMPLANT
VALVE BIOPSY US BRONCHSCP STRL LF  DISP (ENDOSCOPIC SUPPLIES) ×1 IMPLANT

## 2024-06-17 NOTE — Assessment & Plan Note (Signed)
 Blood pressure acceptable.    Continue metoprolol  and losartan .    Monitor blood pressure and adjust as needed.

## 2024-06-17 NOTE — Care Management Notes (Signed)
Pt already d/c-mailed IM letter    Scanned to acct

## 2024-06-17 NOTE — Assessment & Plan Note (Signed)
 Presented with nonspecific symptoms.  During workup CT chest with soft tissue thickening in right lower lobe concerning for malignancy.  Also with right lower cervical, supraclavicular, mediastinal and right hilar adenopathy compatible with malignancy.  Consulted Oncology and pulmonology for further recommendations.  Underwent bronch with EBUS biopsy today  Will need outpatient PET scan  Await pathology report  Follow up with pulm/heme onc in 1 week outpatient  Percocets for pain control

## 2024-06-17 NOTE — Anesthesia Transfer of Care (Signed)
 ANESTHESIA TRANSFER OF CARE   [name] is a 68 y.o. ,female, Weight: 59 kg (130 lb)   had Procedure(s) with comments:  ULTRASOUND ENDOBRONCHIAL - spoke with histo 604am 06-17-2024 jackie  performed  06/17/24   Primary Service: Amy Casilda KIDD*    Past Medical History:   Diagnosis Date   . Abdominal pain    . Cancer (CMS HCC)    . Chest pain    . Chronic lung disease    . Coronary artery disease    . Dyslipidemia    . Essential hypertension    . Heart disease    . History of percutaneous coronary intervention    . History of stress test    . Mixed hyperlipidemia    . Unspecified chronic bronchitis       Allergy History as of 06/17/24        No Known Allergies                  I completed my transfer of care / handoff to the receiving personnel during which we discussed:  Access, Airway, All key/critical aspects of case discussed, Analgesia, Antibiotics, Expectation of post procedure, Fluids/Product, Gave opportunity for questions and acknowledgement of understanding, Labs and PMHx  Report given to: Filbert Led, RN    Post Location: PACU                        Additional Info:Oral airway. SFM. VSS> Rn at bedside.                                    Last OR Temp: Temperature: 36.2 C (97.1 F)  ABG:  POTASSIUM   Date Value Ref Range Status   06/17/2024 4.1 3.2 - 5.0 mmol/L Final     KETONES   Date Value Ref Range Status   06/14/2024 Negative Negative mg/dL Final     CALCIUM   Date Value Ref Range Status   06/17/2024 9.6 8.3 - 10.7 mg/dL Final     Calculated P Axis   Date Value Ref Range Status   01/12/2024 70 degrees Final     Calculated R Axis   Date Value Ref Range Status   01/12/2024 45 degrees Final     Calculated T Axis   Date Value Ref Range Status   01/12/2024 61 degrees Final     IONIZED CALCIUM   Date Value Ref Range Status   02/10/2023 1.18 1.15 - 1.33 mmol/L Final     Airway:* No LDAs found *  Blood pressure 113/67, pulse 88, temperature 36.2 C (97.1 F), resp. rate 18, height 1.549 m (5' 1),  weight 59 kg (130 lb), SpO2 100%.

## 2024-06-17 NOTE — Assessment & Plan Note (Signed)
 Rate controlled.  Continue metoprolol .    Not on anticoagulation.  Follows up outpatient with Cardiology.

## 2024-06-17 NOTE — OR Surgeon (Signed)
 Bronchoscopy Procedure Note   Procedure:  Flexible fiberoptic bronchoscopy with airway evaluation  Left lower lobe bronchoalveolar lavage.  Endobronchial ultrasound guided biopsy of station # 7 X 5    Pre/post-op Diagnosis:  Mediastinal adenopathy.    Date of procedure:  06/17/2024    Surgeon: Elsie Marsa Gully, MD     Discussed with patient  regarding the risks and benefits of doing flexible bronchoscopy.  Discussed the complications as well as treatment options, and expected outcomes were discussed with the patient. The possibilities of reaction to medication, pulmonary aspiration, perforation of organs, bleeding, recurrent infection, the need for additional procedures, failure to diagnose a condition, and creating a complication requiring transfusion or operation were discussed with the patient.  Patient understood.     Findings: Successful biopsy of station 7.  Normal airway otherwise.      Procedure Details   Informed consent was obtained for the procedure, including possible sedation.  Risks of hypoxemia, lung injury/perforation, hemorrhage, infection, arrhythmia, and adverse drug reaction were discussed.      Time-Out Process performed, verified patient identification, verified procedure, verified correct patient position, special equipment available.  The patient was sedated by anesthesia and an LMA was placed.   The bronchoscope was then advanced into the airway and a full examination was performed.  The airway examination showed no airway abnormalities other than moderate mucus plugging in bases bilaterally.  Worse in left base and thick mucus plugging removed.  LLL BAL was completed.  The therapeutic bronchoscope was removed from the patient and the endobronchial ultrasound scope was advanced.  There was significant adenopathy in station 7, and biopsies X 5 were taken.  The patient was stable throughout the procedure without any complications.      Plan:  Await results of biopsy results.           Estimated Blood Loss:    <10 cc                 Complications:    No immediate.  Post procedure CXR to evaluate for PTX is currently pending           Disposition: PACU - hemodynamically stable.           Condition: stable    Elsie Marsa Gully, MD  06/17/2024 14:03

## 2024-06-17 NOTE — Assessment & Plan Note (Signed)
 S/p double mastectomy 2 years ago at [facility]

## 2024-06-17 NOTE — Assessment & Plan Note (Signed)
 Continue metoprolol , aspirin  and atorvastatin   No chest pain or shortness for breath

## 2024-06-17 NOTE — Anesthesia Postprocedure Evaluation (Signed)
 Anesthesia Post Op Evaluation    Patient: [name]  Procedure(s) with comments:  ULTRASOUND ENDOBRONCHIAL - spoke with histo 604am 06-17-2024 jackie    Last Vitals:Temperature: 36.8 C (98.3 F) (06/17/24 1310)  Heart Rate: 89 (06/17/24 1255)  BP (Non-Invasive): 121/75 (06/17/24 1310)  Respiratory Rate: 16 (06/17/24 1255)  SpO2: 95 % (06/17/24 1255)    No notable events documented.    Anesthesia Post Evaluation

## 2024-06-17 NOTE — Telephone Encounter (Signed)
 Holding for cancellation for Ret. Procedure appt with any midlevel on 9 or 10 spot

## 2024-06-17 NOTE — Progress Notes (Signed)
 [facility]  Pulmonary Consult Initial    [name], [name], 68 y.o. female  Encounter Start Date:  06/14/2024  Inpatient Admission Date:  06/14/2024  Date of service: 06/17/2024  Date of Birth:  November 07, 1955    Hospital Day:  LOS: 3 days     Service: Pulmonary    HPI/Discussion:  [name] is a 68 y.o., White female who presents with complaints of ear and flank pain radiating to the abdomen and chest.  Workup in ED showed unremarkable labs but CT of the chest showed right lower lobe mass and right adenopathy, suspicious for malignancy prompting our consult. She does has a history of breast cancer at [facility]. She has reported a 10 lb weight loss.  Denies any respiratory symptoms but is a 1 pack per day smoker.  Does report pain radiating to abdomen and chest.  Denies nausea vomiting.    9/7:  Awake and alert.  Remains on room air. Anxious for biopsy. Denies nausea vomiting.    9/8:  Awake and alert.  On room air.  No shortness a breath, cough, hemoptysis, chest pain, fever, nausea vomiting.  Currently NPO for an endobronchial ultrasound bronchoscopy.    EXAM:  Temperature: 36.6 C (97.8 F)  Heart Rate: 87  BP (Non-Invasive): (!) 153/96  Respiratory Rate: 15  SpO2: 93 %    Physical exam:  General:  Age-appropriate, chronically ill-appearing, older female who appears in no acute distress.  HEENT:  hair is normal, no facial trauma, moist mucous membranes, fair dentition.  Eyes:  Benign conjunctiva, anicteric sclera, intact lid and eye movements.  Neck:  Supple, trachea is midline, no mass.  Cardiovascular:  Heart is regular in rate and rhythm without an appreciable murmur; no peripheral edema or frank ischemia. Pulses +2 times four ext.  Pulmonary:  Clear breath sounds bilaterally; good effort; no tachypnea or accessory muscle use at rest.  Gastrointestinal:  Abdomen is soft, nontender, and nondistended; positive bowel sounds.  Musculoskeletal:  No inflammatory changes involving  the joints and no gross bony deformities; normal muscle mass and tone.  Genitourinary: No foley catheter or suprapubic tenderness.  Skin:  Warm, dry, and pink; capillary refill is less than 3 seconds; no rash or ulceration.  Neurologic:  Awake, alert, and oriented x3; MAE; no focal deficits.  Psychiatric:  Calm, cooperative; speech is clear; intact insight and judgement, appropriate mood and affect.    Respiratory requirements:  Room air        Labs:  Lab Results Today:    Results for orders placed or performed during the hospital encounter of 06/14/24 (from the past 24 hours)   CBC - AM ONCE   Result Value Ref Range    WBC 6.3 3.7 - 11.0 x10^3/uL    RBC 4.21 3.85 - 5.22 x10^6/uL    HGB 11.8 11.5 - 16.0 g/dL    HCT 62.0 65.1 - 53.9 %    MCV 90.0 78.0 - 100.0 fL    MCH 28.0 26.0 - 32.0 pg    MCHC 31.1 31.0 - 35.5 g/dL    RDW-CV 87.5 88.4 - 84.4 %    PLATELETS 185 150 - 400 x10^3/uL    MPV 9.2 8.7 - 12.5 fL   BASIC METABOLIC PANEL   Result Value Ref Range    SODIUM 142 133 - 144 mmol/L    POTASSIUM 4.1 3.2 - 5.0 mmol/L    CHLORIDE 108 (H) 96 - 106 mmol/L  CO2 TOTAL 24 22 - 30 mmol/L    ANION GAP 10 7 - 18 mmol/L    CALCIUM 9.6 8.3 - 10.7 mg/dL    GLUCOSE 897 74 - 890 mg/dL    BUN 10 8 - 23 mg/dL    CREATININE 9.37 9.49 - 0.90 mg/dL    BUN/CREA RATIO 16     ESTIMATED GFR >90 >90 mL/min/1.51m^2   MAGNESIUM  - AM ONCE   Result Value Ref Range    MAGNESIUM  1.5 (L) 1.6 - 2.4 mg/dL   PHOSPHORUS - AM ONCE   Result Value Ref Range    PHOSPHORUS 3.6 2.5 - 4.5 mg/dL       Imaging Studies:  No results found.      Assessment/Recommendations:   Active Hospital Problems    Diagnosis    Primary Problem: Malignant neoplasm of lower lobe of right lung (CMS HCC)    Lung mass    Simple chronic bronchitis (CMS HCC)    Metastatic cancer to liver (CMS HCC)    Malaise    Feeling unwell    RUQ pain    History of breast cancer    Mediastinal adenopathy    Paroxysmal atrial fibrillation (CMS HCC)    S/P CABG x 1    Stewart-Treves syndrome     Coronary artery disease    Hypertension    Hyperlipidemia       Patient seen and examined  Charts/labs/imaging personally reviewed.  Patient presented with flank and chest pain radiating to abdomen,  CT scan showed right lower cervical and supraclavicular adenopathy including O2 cm supraclavicular nodule attestation mediastinal adenopathy.  Endobronchial ultrasound today.  Patient agreeable.  Oncology following.   Currently on room air.  Encouraged smoking cessation.  Plan for outpatient PFTs.    Patient was personally seen and examined.  Plan was discussed with my attending, his addendum to follow.   Scott Cordella Friend, APRN-FNP-BC    _______________________________    I personally saw and evaluated the patient as part of a shared service with an APP.    My substantive findings are:  MDM (complete) I have personally managed 1 acute illness with systemic symptoms.  (chest pain requiring hospitalization)  I have reviewed and actively participated in management of patient's prescription medications.  Continue Lopressor  and cozaar  for BP.  Bronchoscopy done without complications.  Await path.    CXR images today personally reviewed:  Stable adenopathy with no postoperative pneumothorax.        Elsie Marsa Gully, MD  06/17/2024 13:54

## 2024-06-17 NOTE — OR Nursing (Signed)
 Intact balloon removed from EBUS scope.

## 2024-06-17 NOTE — Nurses Notes (Signed)
 Discharge delayed due to waiting on ride home. Patient sent to discharge lounge.

## 2024-06-17 NOTE — Telephone Encounter (Signed)
 Procedure follow up appt

## 2024-06-17 NOTE — Anesthesia Preprocedure Evaluation (Signed)
 ANESTHESIA PRE-OP EVALUATION  Planned Procedure: ULTRASOUND ENDOBRONCHIAL  Review of Systems     anesthesia history negative     patient summary reviewed  nursing notes reviewed        Pulmonary   COPD, severe and shortness of breath,   Cardiovascular    Hypertension, CAD, atrial fibrillation and hyperlipidemia ,       GI/Hepatic/Renal    GERD, well controlled and liver disease        Endo/Other         Neuro/Psych/MS        Cancer  CA,   lung cancer,                   Physical Assessment      Airway       Mallampati: III    TM distance: >3 FB    Neck ROM: full  Mouth Opening: fair.  No Facial hair  No Beard    No Tracheostomy present    Dental           (+) upper dentures           Pulmonary           Cardiovascular    Rhythm: regular  Rate: Normal       Other findings            Plan  ASA 4     Planned anesthesia type: general     general anesthesia with endotracheal tube intubation      PONV Plan:  I plan to administer pharmcologic prophalaxis antiemetics  POV PLAN:   post-pubertal            Intravenous induction     Anesthesia issues/risks discussed are: Dental Injuries, Post-op Agitation/Tantrum, Post-op Pain Management, Stroke, Aspiration, Nerve Injuries, PONV, Cardiac Events/MI, Intraoperative Awareness/ Recall and Sore Throat.  Anesthetic plan and risks discussed with patient  signed consent obtained          Patient's NPO status is appropriate for Anesthesia.           Plan discussed with CRNA.

## 2024-06-17 NOTE — Assessment & Plan Note (Signed)
 Secondary to liver mets

## 2024-06-17 NOTE — Care Management Notes (Signed)
 [facility]  Care Management Note    Patient Name: [name]  Date of Birth: 22-Jun-1956  Sex: female  Date/Time of Admission: 06/14/2024 11:59 AM  Room/Bed: ENDP/ENDP  Payor: [employer] MEDICARE / Plan: [employer] CHOICE PPO / Product Type: PPO /    LOS: 3 days   Primary Care Providers:  Pcp, No (General)    Admitting Diagnosis:  Lung mass [R91.8]    Assessment:   Chart reviewed for discharge planning. No reported needs at this time. Please update Care Management with any new needs prior to discharge      Discharge Plan:  Home (Patient/Family Member/other) (code 1)  Patient's current discharge plan is home with family. Patient has no CM needs. Please advise if needs arise.     The patient will continue to be evaluated for developing discharge needs.     Case Manager: Macky Galik  Phone: [phone]

## 2024-06-17 NOTE — Telephone Encounter (Signed)
 Follow up next week for Bronchoscopy results.

## 2024-06-17 NOTE — Consults (Signed)
 [name]  Hematology/Oncology    Progress Note      [name], [name], 68 y.o. female  Date of Birth:  [birth year]  Inpatient Admission Date:  06/14/2024  Date of Service: 06/17/2024    Chief Complaint:  Lung mass    HPI:  Sitting up in bed, resting.  Reports feeling better overall today.  Anticipating bronchoscopy with biopsy today per pulmonology, 06/17/2024.    Review of Systems:   Review of Systems   Constitutional:  Negative for activity change, appetite change, chills, diaphoresis, fatigue, fever and unexpected weight change.   HENT:  Negative for congestion, ear pain, facial swelling, hearing loss, mouth sores, nosebleeds, sinus pain, sore throat, tinnitus, trouble swallowing and voice change.    Eyes:  Negative for photophobia, pain and visual disturbance.   Respiratory:  Negative for apnea, cough, choking, chest tightness, shortness of breath, wheezing and stridor.    Cardiovascular:  Negative for chest pain, palpitations and leg swelling.   Gastrointestinal:  Negative for abdominal distention, abdominal pain, anal bleeding, blood in stool, constipation, diarrhea, nausea, rectal pain and vomiting.   Endocrine: Negative for cold intolerance and heat intolerance.   Genitourinary:  Negative for decreased urine volume, difficulty urinating, dysuria, flank pain, frequency, hematuria and urgency.   Musculoskeletal:  Negative for arthralgias, back pain, gait problem, joint swelling, myalgias and neck pain.   Skin:  Negative for color change, pallor, rash and wound.   Allergic/Immunologic: Negative for immunocompromised state.   Neurological:  Negative for dizziness, syncope, facial asymmetry, speech difficulty, weakness, light-headedness, numbness and headaches.   Hematological:  Negative for adenopathy. Does not bruise/bleed easily.   Psychiatric/Behavioral:  Negative for agitation, behavioral problems and confusion.      Past Medical History:   Diagnosis Date    Abdominal pain     Cancer (CMS  HCC)     Chest pain     Chronic lung disease     Coronary artery disease     Dyslipidemia     Essential hypertension     Heart disease     History of percutaneous coronary intervention     History of stress test     Mixed hyperlipidemia     Unspecified chronic bronchitis      Past Surgical History:   Procedure Laterality Date    CARDIAC CATHETERIZATION      CORONARY ANGIOPLASTY      CORONARY ARTERY STENT PLACEMENT      HX BACK SURGERY      HX CORONARY ARTERY BYPASS GRAFT      HX MASTECTOMY, SIMPLE Bilateral     HX TUBAL LIGATION      NECK SURGERY       Social History     Socioeconomic History    Marital status: Widowed     Spouse name: Not on file    Number of children: Not on file    Years of education: Not on file    Highest education level: Not on file   Occupational History    Not on file   Tobacco Use    Smoking status: Every Day     Current packs/day: 0.50     Types: Cigarettes    Smokeless tobacco: Never   Vaping Use    Vaping status: Never Used   Substance and Sexual Activity    Alcohol use: Yes     Comment: occasional    Drug use: Yes     Types: Marijuana  Sexual activity: Not on file   Other Topics Concern    Not on file   Social History Narrative    Not on file     Social Determinants of Health     Financial Resource Strain: Not on file   Transportation Needs: Not on file   Social Connections: Low Risk (06/14/2024)    Social Connections     SDOH Social Isolation: 5 or more times a week   Intimate Partner Violence: Not on file   Housing Stability: Not on file      Family Medical History:       Problem Relation (Age of Onset)    Arthritis Mother, Maternal Grandmother    Cancer Father, Other    Heart Attack Father, Other    Heart Disease Brother, Other    Hypertension (High Blood Pressure) Mother, Father, Other    Pacemaker Mother          Vital Signs:  Temp  Avg: 36.6 C (97.9 F)  Min: 36.2 C (97.1 F)  Max: 36.8 C (98.2 F)    Pulse  Avg: 81.7  Min: 70  Max: 89 BP  Min: 104/74  Max: 153/96   Resp  Avg:  15.6  Min: 14  Max: 18 SpO2  Avg: 95.6 %  Min: 93 %  Max: 100 %          Input/Output    Intake/Output Summary (Last 24 hours) at 06/17/2024 1305  Last data filed at 06/17/2024 0923  Gross per 24 hour   Intake 0 ml   Output --   Net 0 ml    I/O last shift:  No intake/output data recorded.     LABS:   Results for orders placed or performed during the hospital encounter of 06/14/24 (from the past 24 hours)   CBC - AM ONCE   Result Value Ref Range    WBC 6.3 3.7 - 11.0 x10^3/uL    RBC 4.21 3.85 - 5.22 x10^6/uL    HGB 11.8 11.5 - 16.0 g/dL    HCT 62.0 65.1 - 53.9 %    MCV 90.0 78.0 - 100.0 fL    MCH 28.0 26.0 - 32.0 pg    MCHC 31.1 31.0 - 35.5 g/dL    RDW-CV 87.5 88.4 - 84.4 %    PLATELETS 185 150 - 400 x10^3/uL    MPV 9.2 8.7 - 12.5 fL   BASIC METABOLIC PANEL   Result Value Ref Range    SODIUM 142 133 - 144 mmol/L    POTASSIUM 4.1 3.2 - 5.0 mmol/L    CHLORIDE 108 (H) 96 - 106 mmol/L    CO2 TOTAL 24 22 - 30 mmol/L    ANION GAP 10 7 - 18 mmol/L    CALCIUM 9.6 8.3 - 10.7 mg/dL    GLUCOSE 897 74 - 890 mg/dL    BUN 10 8 - 23 mg/dL    CREATININE 9.37 9.49 - 0.90 mg/dL    BUN/CREA RATIO 16     ESTIMATED GFR >90 >90 mL/min/1.33m^2   MAGNESIUM  - AM ONCE   Result Value Ref Range    MAGNESIUM  1.5 (L) 1.6 - 2.4 mg/dL   PHOSPHORUS - AM ONCE   Result Value Ref Range    PHOSPHORUS 3.6 2.5 - 4.5 mg/dL     IMAGES:  Results for orders placed or performed during the hospital encounter of 06/14/24 (from the past 72 hours)   XR CHEST AP     Status:  Abnormal    Narrative    [name]    XR CHEST AP, 06/14/2024 1:25 PM    INDICATION:  dyspnea, respiratory illness   Additional History:  dyspnea, respiratory illness    TECHNIQUE:  Single portable frontal view of the chest.  1 views/1 images submitted for interpretation.  COMPARISON: 09/14/2023.  ___________________________________  FINDINGS:    The heart is within normal limits for size. Median sternotomy wires, inferior surgical fusion, and numerous bilateral breast surgical clips are stable.  The right hilum is enlarged. No effusion, infiltrate, or pneumothorax is seen. Bilateral apical pulmonary scar is stable. There is mild dextrocurvature of the thoracic spine.  ___________________________________    Impression    Right hilar enlargement may represent mass, adenopathy or vascular enlargement. Enhanced CT chest would be the test of choice for further evaluation.      A Significant Orange actionable finding has been sent via the [employer] Actionable Findings application on 06/14/2024 1:55 PM, Message ID [id number]. Receipt of this communication will be communicated to [facility] RADIOLOGY STAFF or responsible provider and will be documented in [employer] Actionable Findings System upon receiving the acknowledgement.      Radiologist location ID: [id number]     US  GALLBLADDER     Status: None    Narrative    Amiliana JEAN Ziomek    US  GALLBLADDER performed on 06/14/2024 1:52 PM.    INDICATION:  prior CT questioned cholelithiasis, later ruled out by repeat CT and US , presents back today with nausea/vomiting/RUQ pain, mild transaminitis   Additional History:  abdominal pain; nausea and vomiting    TECHNIQUE:  Transabdominal grayscale sonogram of the gallbladder and common bile duct.      COMPARISON:  CT abdomen/pelvis 01/12/2024.  ___________________________________  FINDINGS:    The common bile duct measures up to 7.6 mm in caliber, mildly enlarged for age but similar to 01/12/2024.  Gallbladder wall thickness: 1.9 mm    The gallbladder is unremarkable in size and wall thickness, without pericholecystic fluid or cholelithiasis.  ___________________________________    Impression    Unremarkable gallbladder.  Mild chronic common bile duct dilation.      Radiologist location ID: [id number]     CT BRAIN WO IV CONTRAST     Status: None    Narrative    Chelsi JEAN Korf    CT BRAIN WO IV CONTRAST  06/14/2024 2:48 PM    INDICATION:  generalized malaise, generally unwell, n/v, chest mass on XR imaging, further  character/staging  Body aches    TECHNIQUE: Head CT performed without IV contrast. Dose modulation, automated exposure control, and/or iterative reconstruction were used for dose reduction.    COMPARISON:  11/08/2006.    FINDINGS:  No evidence of acute intracranial hemorrhage, mass, or acute infarct.    No midline shift.  CSF spaces are relatively symmetric.   Numerous white matter hypodensities.  Well aerated paranasal sinuses and mastoids.          Impression    No evidence of acute intracranial disease.    Findings compatible with chronic small vessel ischemic changes.        Note that in acute stroke, findings may not be visible on CT for the first 48 hours.       Radiologist location ID: [id number]     CT CHEST ABDOMEN PELVIS W IV CONTRAST     Status: None    Narrative    Channing JEAN Armistead    CT CHEST ABDOMEN PELVIS  W IV CONTRAST performed on 06/14/2024 2:55 PM.    INDICATION:  generalized malaise, generally unwell, n/v, chest mass on XR imaging, further character/staging   Additional History:  Body aches, vomiting, abdominal pain    TECHNIQUE:  CT chest, abdomen, and pelvis with axial, coronal, and sagittal multiplanar reformations, performed with IV contrast. Dose modulation, automated exposure control, and/or iterative reconstruction were used for dose reduction.    CONTRAST:  75 mL Isovue  370 IV    COMPARISON:  Same date CXR. CT abdomen/pelvis 01/12/2024. CT chest 12/28/2023..  ___________________________________  FINDINGS:    Chest:  * Right lower cervical and supraclavicular adenopathy including a 2 cm supraclavicular node. Multistation mediastinal adenopathy involving the prevascular, bilateral paratracheal, and subcarinal stations, for example a 2 cm right lower paratracheal node and 2.8 cm subcarinal node. Right hilar adenopathy measuring up to 1.9 cm.  * No aortic aneurysm or central pulmonary embolism. Atherosclerosis with CAD post CABG.  * No pleural effusion.  * Emphysema. New branching expansile  changes along the bronchovascular bundles of the right lower lobe with local nodularity, malignancy until otherwise proven. Otherwise no focal consolidation, pulmonary edema or mass is identified. Similar apical right upper lobe scarring.  * Patent central airways. Proximal right bronchial narrowing through the level of the mediastinal adenopathy.  * No aggressive osseous lesion is identified. Degenerative disc disease. Postsurgical changes in the bilateral breasts with scattered surgical clips.    Abdomen/pelvis:  * Multiple new indistinct subcentimeter hypodensities in the right hemiliver suspicious for metastasis.  * Normal caliber gallbladder and bile ducts.  * No splenomegaly.  * No pancreatic inflammation or ductal dilation.  * No adrenal nodule.  * Bilateral nephrolithiasis an small renal cysts. No hydronephrosis or identified mass.  * Normal caliber bowel without overt inflammatory changes. Colonic diverticulosis.  * No free fluid or gas, bulky adenopathy or aortic aneurysm. Advanced atherosclerosis.  * Unremarkable uterus, adnexa and urinary bladder.  * No aggressive osseous lesion.    ___________________________________    Impression    Chest:  1. Right lower cervical, supraclavicular, mediastinal and right hilar adenopathy compatible with malignancy.  2. New expansile soft tissue thickening along the bronchovascular bundles of the right lower lobe, considered malignancy until otherwise proven.  3. Emphysema.    Abdomen/pelvis:  1. New subcentimeter hypoechoic hepatic lesions, presumably metastatic.  2. Additional chronic and incidental findings as above.    PET/CT may be of diagnostic benefit.            Radiologist location ID: [id number]       Physical Exam:  Physical Exam  Vitals reviewed.   Constitutional:       Appearance: Normal appearance. She is normal weight.   HENT:      Head: Normocephalic.      Nose: Nose normal.      Mouth/Throat:      Pharynx: Oropharynx is clear.   Eyes:      Extraocular  Movements: Extraocular movements intact.      Conjunctiva/sclera: Conjunctivae normal.   Cardiovascular:      Rate and Rhythm: Normal rate.      Pulses: Normal pulses.   Pulmonary:      Effort: Pulmonary effort is normal.   Abdominal:      Palpations: Abdomen is soft.   Musculoskeletal:         General: Normal range of motion.      Cervical back: Normal range of motion.   Skin:  General: Skin is dry.   Neurological:      General: No focal deficit present.      Mental Status: She is alert and oriented to person, place, and time. Mental status is at baseline.   Psychiatric:         Mood and Affect: Mood normal.         Behavior: Behavior normal.         Thought Content: Thought content normal.         Judgment: Judgment normal.       Problem List:  Active Hospital Problems   (*Primary Problem)    Diagnosis    *Malignant neoplasm of lower lobe of right lung (CMS HCC)    Cigarette nicotine dependence, uncomplicated    Lung mass    Simple chronic bronchitis (CMS HCC)     Chronic    Metastatic cancer to liver (CMS HCC)    Malaise    Feeling unwell    RUQ pain    History of breast cancer     Chronic    Mediastinal adenopathy    Paroxysmal atrial fibrillation (CMS HCC)     Chronic    S/P CABG x 1     Chronic    Stewart-Treves syndrome    Coronary artery disease     Chronic    Hypertension     Chronic    Hyperlipidemia     Chronic      Assessment/Plan:    68 year old female presenting with lung mass:     Metastatic cancer: Lung mass with suggested hepatic metastases as evidenced by CT CAP.  Pulmonology also following.  Patient agreeable to EBUS bronchoscopy with biopsy today, 06/17/2024.  CEA mildly elevated at 5.5.  CA-125 and AFP are normal at 7.5 and 3.7 respectively.  We will await pathology report.    History of breast cancer: Does endorse previous breast CA history with subsequent bilateral mastectomy - no hx of CRT.  Does not currently follow with Oncology on outpatient basis.    Paroxysmal atrial fib:  Currently  NSR.  On beta-blocker.    CAD:  History of CABG x1.  On cardioprotective medication 81 mg ASA daily.    Hyperlipidemia: On statin.    Hypertension: Managed with antihypertensives.    Tobacco dependence: Advised cessation.  Counseled on harmful side effects.      Attending note:  I have independently seen and examined the patient on the same date of service as our mid-level.  I agree with the plan of care as documented by my mid-level.  I have personally reviewed  the pertinent medications, labs  and radiographic studies  as documented in my mid-level note.  The case was discussed in detail with the midlevel provider.  I agree with the above note.  Some changes and additions were made.  I spent more than 50% of the total encounter time for the care of this patient and documentation.      Norleen Amen, MD

## 2024-06-17 NOTE — Assessment & Plan Note (Signed)
Stable; continue supportive care 

## 2024-06-17 NOTE — Discharge Summary (Signed)
 [facility]  DISCHARGE SUMMARY    PATIENT NAME:  [name], [name]  MRN:  [id number]  DOB:  11-May-1956    ENCOUNTER DATE:  06/14/2024  INPATIENT ADMISSION DATE: 06/14/2024  DISCHARGE DATE:  06/17/2024    ATTENDING PHYSICIAN: Shaunna Ami CROME, MD  SERVICE: [facility] HOSPITALIST 6  PRIMARY CARE PHYSICIAN: No Pcp       No lay caregiver identified.    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.0] 06/15/2024    Metastatic cancer to liver (CMS HCC) [C78.7] 06/15/2024    Malaise [R53.81] 06/15/2024    Feeling unwell [R68.89] 06/15/2024    RUQ pain [R10.11] 06/15/2024    History of breast cancer [Z85.3] 06/14/2024    Mediastinal adenopathy [R59.0] 06/14/2024    Paroxysmal atrial fibrillation (CMS HCC) [I48.0] 02/12/2023    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        Details   oxyCODONE -acetaminophen  7.5-325 mg Tablet  Commonly known as: PERCOCET   1 Tablet, Oral, EVERY 4 HOURS PRN  Qty: 60 Tablet  Refills:  0            CONTINUE these medications which have CHANGED during your visit.        Details   gabapentin  400 mg Capsule  Commonly known as: NEURONTIN   What changed: when to take this   400 mg, Oral, 3 TIMES DAILY  Qty: 90 Capsule  Refills: 0            CONTINUE these medications - NO CHANGES were made during your visit.        Details   anastrozole  1 mg Tablet  Commonly known as: ARIMIDEX    1 mg, Daily  Refills: 0     aspirin  81 mg Tablet, Chewable   81 mg, Oral, Daily  Qty: 30 Tablet  Refills: 2     atorvastatin  40 mg Tablet  Commonly known as: LIPITOR   40 mg, Oral, Daily  Qty: 90 Tablet  Refills: 3     cholecalciferol  (vitamin D3) 25 mcg (1,000 unit) Tablet   1,000 Units, Daily  Refills: 0     clonazePAM  0.5 mg Tablet  Commonly known as: klonoPIN    0.5 mg, 2 TIMES DAILY PRN  Refills: 0     DULoxetine  60 mg Capsule, Delayed Release(E.C.)  Commonly known as: CYMBALTA  DR   60 mg, Daily  Refills: 0     fluticasone  propionate 50 mcg/actuation Spray, Suspension  Commonly known as: FLONASE    1 Spray, DAILY PRN  Refills: 0     losartan  25 mg Tablet  Commonly known as: COZAAR    25 mg, Oral, Daily  Qty: 90 Tablet  Refills: 3     metoprolol  tartrate 25 mg Tablet  Commonly known as: LOPRESSOR    25 mg, 2 TIMES DAILY  Refills: 0     MV,Ca,Ir,Min-FA-Lut-Lyco-HC153 3-133-33 mg-mcg-mcg Capsule   1 Capsule, Daily  Refills: 0     omega-3 fatty acid 1 gram Capsule  Commonly known as: LOVAZA    1 Capsule, Daily  Refills: 0     pantoprazole  40 mg Tablet, Delayed Release (E.C.)  Commonly known as: PROTONIX    40 mg, Daily  Refills: 0     promethazine  25 mg Tablet  Commonly known as: PHENERGAN    25 mg, Oral, EVERY 8 HOURS PRN  Qty: 30 Tablet  Refills: 0     traZODone  150 mg Tablet  Commonly known as: DESYREL    150 mg, NIGHTLY  Refills: 0            STOP taking these medications.      sertraline  100 mg Tablet  Commonly known as: ZOLOFT             Discharge med list refreshed?  YES     Allergies[1]  HOSPITAL PROCEDURE(S):   No orders of  the defined types were placed in this encounter.    Surgical/Procedural Cases on this Admission       Case IDs Date Procedure Surgeon Location Status    [id number] 06/17/24 ULTRASOUND ENDOBRONCHIAL Desiderio Elsie Blunt, MD [facility]          REASON FOR HOSPITALIZATION AND HOSPITAL COURSE   BRIEF HPI BASED UPON ADMISSION H/P:  History of Present Illness:  [name] is a 68 y.o. female who presents  to the ED with complaints of ear pain and flank pain radiating to the abdomen and chest.  Pain is described as sharp.  Reports pain radiates to abdomen and chest.  With associated headaches  , generalized weakness and loss of appetite.  Denies nausea, vomiting or abdominal pain.   On presentation to the ED vital signs within normal limits.  Laboratory workup essentially unremarkable.  CT  chest with right lower lobe mass and right adenopathy suspicious for malignancy        BRIEF HOSPITAL NARRATIVE:    Patient presented to ER with c/o weight loss and fatigue as well as abdominal/low back pain.  Found to have lung mass with what appeared to be significant metastasis and patient admitted for pain control and further workup.  Pain controlled with PO Norco and breakthrough morphine .  She underwent bronchoscopy with EBUS to obtain tissue sample/biopsy.  At time of discharge pathology pending after bronch today.  She will f/u with heme/onc and pulmonary in 1 week for further discussion on treatment plan according to biopsy results.  To be discharged with PO percocets as well as schedule TID neurontin         Assessment & Plan  Malignant neoplasm of lower lobe of right lung (CMS HCC)  Metastatic cancer to liver (CMS HCC)  Lung mass  Mediastinal adenopathy  Presented with nonspecific symptoms.  During workup CT chest with soft tissue thickening in right lower lobe concerning for malignancy.  Also with right lower  cervical, supraclavicular, mediastinal and right hilar adenopathy compatible with malignancy.  Consulted  Oncology and pulmonology for further recommendations.  Underwent bronch with EBUS biopsy today  Will need outpatient PET scan  Await pathology report  Follow up with pulm/heme onc in 1 week outpatient  Percocets for pain control    RUQ pain  Secondary to liver mets  Coronary artery disease  S/P CABG x 1  Continue metoprolol , aspirin  and atorvastatin   No chest pain or shortness for breath  Hypertension    Blood pressure acceptable.    Continue metoprolol  and losartan .    Monitor blood pressure and adjust as needed.  Hyperlipidemia    Continue atorvastatin .  Paroxysmal atrial fibrillation (CMS HCC)    Rate controlled.  Continue metoprolol .    Not on anticoagulation.      Follows up outpatient with Cardiology.  History of breast cancer  Stewart-Treves syndrome    S/p double mastectomy 2 years ago at [facility]  Simple chronic bronchitis (CMS HCC)  Stable continue supportive care  Cigarette nicotine dependence, uncomplicated  Encourage cessation     .  TRANSITION/POST DISCHARGE CARE/PENDING TESTS/REFERRALS: Follow up with Pulmonary in 1 week.  Follow up with heme/onc in 1 week as well get biopsy results and to schedule PET scan.     CONDITION ON DISCHARGE:  A. Ambulation: Full ambulation  B. Self-care Ability: Complete  C. Cognitive Status Alert and Oriented x 3  D. Code status at discharge: Full code      LINES/DRAINS/WOUNDS AT DISCHARGE:   Patient Lines/Drains/Airways Status       Active Line / Dialysis Catheter / Dialysis Graft / Drain / Airway / Wound       Name Placement date Placement time Site Days    Wound  Incision Medial Chest 02/06/23  1300  -- 497                    DISCHARGE DISPOSITION:  Home discharge  DISCHARGE INSTRUCTIONS:  Post-Discharge Follow Up Appointments       Follow up with Pulmonary, [facility] Pulmonology    Phone: [phone]    Where: [address] [place], [place] [address]    Follow up with Hematology/Oncology, Medical Office Building [place]     Phone: [phone]    Where: [address], [place] [address]    Follow up with Roylene, [place]  M, FNP in 1 week    Phone: [phone]    Where: [address] [place] [facility] [address]      Tuesday Jun 25, 2024    Ultrasound with [facility] US  1 at 10:30 AM      Friday Aug 23, 2024    Return Patient Visit with Ezzard Raisin, APRN,NP-C at  1:00 PM      Tuesday Mar 18, 2025    New Patient Visit with Germaine, Seldon Leisure, MD at  3:30 PM      Cardiology [facility], Medical Office Building [facility] Building [place], [place]  [address]  [place] [address]  [phone] Gastroenterology, Medical Office Building [facility] Building [place], [place]  [address]  [place] [address]  [phone] Imaging Services, [facility], [place]  [address]  [place] [address]  [phone]             DISCHARGE INSTRUCTION - RESUME HOME  DIET     Diet: RESUME HOME DIET      FOLLOW-UP: [facility], [place]     Reason for visit: HOSPITAL DISCHARGE    Post Discharge Destination: Home    Follow-up in: 1 WEEK      FOLLOW-UP: HEMATOLOGY/ONCOLOGY, [facility] ([address]ND FLOOR, [address]) - [place], [place]     Reason for visit: HOSPITAL DISCHARGE    Post Discharge Destination: Home    Follow-up in: 1 WEEK         Copies sent to Care Team         Relationship Specialty Notifications Start End    Pcp, No PCP - General   12/12/23             Referring providers can utilize [email or web address] to access their referred [facility] patient's information.       Patient was seen and examined on the day of discharge.  Approximately 42 minutes were spent on discharge services on the day of discharge.    Not in acute distress   Cardiovascular: RRR, no murmur, rub or gallop  Respiratory: Clear to auscultation.  No wheezes, rales, or  rhonchi  Abdomen: Soft, nontender, nondistended.  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  Will f/u with heme/onc for further discussion of treatment options.   Ami LITTIE Mohr, MD  06/20/2024 10:02             [1] No Known Allergies

## 2024-06-17 NOTE — Assessment & Plan Note (Signed)
 Continue atorvastatin 

## 2024-06-17 NOTE — Assessment & Plan Note (Signed)
 Encourage cessation.

## 2024-06-17 NOTE — Care Plan (Signed)
 Problem: Wound  Goal: Optimal Coping  Outcome: Ongoing (see interventions/notes)  Goal: Optimal Functional Ability  Outcome: Ongoing (see interventions/notes)  Intervention: Optimize Functional Ability  Recent Flowsheet Documentation  Taken 06/16/2024 2000 by Curtistine SQUIBB, RN  Activity Management: ambulated in room  Activity Assistance Provided: independent  Goal: Absence of Infection Signs and Symptoms  Outcome: Ongoing (see interventions/notes)  Intervention: Prevent or Manage Infection  Recent Flowsheet Documentation  Taken 06/16/2024 2000 by Curtistine SQUIBB, RN  Fever Reduction/Comfort Measures:   lightweight bedding   lightweight clothing  Infection Management: aseptic technique maintained  Goal: Improved Oral Intake  Outcome: Ongoing (see interventions/notes)  Goal: Optimal Pain Control and Function  Outcome: Ongoing (see interventions/notes)  Goal: Skin Health and Integrity  Outcome: Ongoing (see interventions/notes)  Intervention: Optimize Skin Protection  Recent Flowsheet Documentation  Taken 06/16/2024 2000 by Curtistine SQUIBB, RN  Pressure Reduction Techniques:   Mobility is maximized   Moisture, shear and nutrition are maximized  Activity Management: ambulated in room  Head of Bed (HOB) Positioning: HOB elevated  Goal: Optimal Wound Healing  Outcome: Ongoing (see interventions/notes)  Intervention: Promote Wound Healing  Recent Flowsheet Documentation  Taken 06/16/2024 2000 by Curtistine SQUIBB, RN  Pressure Reduction Techniques:   Mobility is maximized   Moisture, shear and nutrition are maximized  Activity Management: ambulated in room     Problem: Adult Inpatient Plan of Care  Goal: Plan of Care Review  Outcome: Ongoing (see interventions/notes)  Goal: Patient-Specific Goal (Individualized)  Outcome: Ongoing (see interventions/notes)  Flowsheets (Taken 06/16/2024 2000)  Individualized Care Needs: keep informed, emotional support, pain management  Anxieties, Fears or Concerns: Wondering when procedure will be  Goal: Absence of  Hospital-Acquired Illness or Injury  Outcome: Ongoing (see interventions/notes)  Intervention: Prevent Skin Injury  Recent Flowsheet Documentation  Taken 06/16/2024 2000 by Curtistine SQUIBB, RN  Body Position: sitting  Skin Protection: adhesive use limited  Intervention: Prevent Infection  Recent Flowsheet Documentation  Taken 06/16/2024 2000 by Curtistine SQUIBB, RN  Infection Prevention: promote handwashing  Goal: Optimal Comfort and Wellbeing  Outcome: Ongoing (see interventions/notes)  Intervention: Provide Person-Centered Care  Recent Flowsheet Documentation  Taken 06/16/2024 2000 by Curtistine SQUIBB, RN  Trust Relationship/Rapport:   care explained   choices provided   emotional support provided  Goal: Rounds/Family Conference  Outcome: Ongoing (see interventions/notes)

## 2024-06-18 LAB — CYTOPATHOLOGY, NON GYN

## 2024-06-19 LAB — RESPIRATORY CULTURE AND GRAM STAIN (PERFORMABLE)
GRAM STAIN: NONE SEEN
GRAM STAIN: NONE SEEN
RESPIRATORY CULTURE: NORMAL

## 2024-06-19 NOTE — Telephone Encounter (Signed)
 Scheduled appt 9.19.25 - spoke with pt and mailed appt card

## 2024-06-20 DIAGNOSIS — C969 Malignant neoplasm of lymphoid, hematopoietic and related tissue, unspecified: Secondary | ICD-10-CM

## 2024-06-20 LAB — CYTOPATHOLOGY, FINE NEEDLE ASPIRATE

## 2024-06-21 LAB — VIRAL RESPIRATORY, RAPID CULTURE WITH REFLEX

## 2024-06-22 LAB — LEGIONELLA DNA, QL REAL-TIME PCR
LEGIONELLA PNEUMOPHILA: NOT DETECTED
LEGIONELLA SPECIES: NOT DETECTED

## 2024-06-24 ENCOUNTER — Other Ambulatory Visit: Payer: Self-pay

## 2024-06-24 ENCOUNTER — Ambulatory Visit: Payer: Self-pay | Attending: HEMATOLOGY-ONCOLOGY | Admitting: HEMATOLOGY-ONCOLOGY

## 2024-06-24 ENCOUNTER — Encounter (HOSPITAL_BASED_OUTPATIENT_CLINIC_OR_DEPARTMENT_OTHER): Payer: Self-pay | Admitting: HEMATOLOGY-ONCOLOGY

## 2024-06-24 VITALS — BP 164/95 | HR 91 | Temp 97.3°F | Ht 61.0 in | Wt 135.0 lb

## 2024-06-24 DIAGNOSIS — C787 Secondary malignant neoplasm of liver and intrahepatic bile duct: Secondary | ICD-10-CM | POA: Insufficient documentation

## 2024-06-24 DIAGNOSIS — Z7982 Long term (current) use of aspirin: Secondary | ICD-10-CM | POA: Insufficient documentation

## 2024-06-24 DIAGNOSIS — Z79630 Long term (current) use of alkylating agent: Secondary | ICD-10-CM | POA: Insufficient documentation

## 2024-06-24 DIAGNOSIS — I10 Essential (primary) hypertension: Secondary | ICD-10-CM | POA: Insufficient documentation

## 2024-06-24 DIAGNOSIS — Z79899 Other long term (current) drug therapy: Secondary | ICD-10-CM | POA: Insufficient documentation

## 2024-06-24 DIAGNOSIS — C3431 Malignant neoplasm of lower lobe, right bronchus or lung: Secondary | ICD-10-CM

## 2024-06-24 DIAGNOSIS — Z79634 Long term (current) use of topoisomerase inhibitor: Secondary | ICD-10-CM | POA: Insufficient documentation

## 2024-06-24 DIAGNOSIS — Z604 Social exclusion and rejection: Secondary | ICD-10-CM | POA: Insufficient documentation

## 2024-06-24 DIAGNOSIS — I2510 Atherosclerotic heart disease of native coronary artery without angina pectoris: Secondary | ICD-10-CM | POA: Insufficient documentation

## 2024-06-24 DIAGNOSIS — C3481 Malignant neoplasm of overlapping sites of right bronchus and lung: Secondary | ICD-10-CM | POA: Insufficient documentation

## 2024-06-24 DIAGNOSIS — Z9013 Acquired absence of bilateral breasts and nipples: Secondary | ICD-10-CM | POA: Insufficient documentation

## 2024-06-24 DIAGNOSIS — R590 Localized enlarged lymph nodes: Secondary | ICD-10-CM

## 2024-06-24 DIAGNOSIS — Z853 Personal history of malignant neoplasm of breast: Secondary | ICD-10-CM | POA: Insufficient documentation

## 2024-06-24 DIAGNOSIS — Z7189 Other specified counseling: Secondary | ICD-10-CM | POA: Insufficient documentation

## 2024-06-24 DIAGNOSIS — Z720 Tobacco use: Secondary | ICD-10-CM

## 2024-06-24 DIAGNOSIS — I480 Paroxysmal atrial fibrillation: Secondary | ICD-10-CM | POA: Insufficient documentation

## 2024-06-24 DIAGNOSIS — F17210 Nicotine dependence, cigarettes, uncomplicated: Secondary | ICD-10-CM | POA: Insufficient documentation

## 2024-06-24 DIAGNOSIS — E785 Hyperlipidemia, unspecified: Secondary | ICD-10-CM | POA: Insufficient documentation

## 2024-06-24 DIAGNOSIS — Z79620 Long term (current) use of immunosuppressive biologic: Secondary | ICD-10-CM | POA: Insufficient documentation

## 2024-06-24 DIAGNOSIS — F17200 Nicotine dependence, unspecified, uncomplicated: Secondary | ICD-10-CM

## 2024-06-24 DIAGNOSIS — Z951 Presence of aortocoronary bypass graft: Secondary | ICD-10-CM | POA: Insufficient documentation

## 2024-06-24 DIAGNOSIS — Z79811 Long term (current) use of aromatase inhibitors: Secondary | ICD-10-CM | POA: Insufficient documentation

## 2024-06-24 NOTE — Nursing Note (Signed)
 06/24/24 1419   OTHER   Respiratory: positive for       + shortness of breath with exertion   Cardiovascular: positive for       + chest pain/pressure/discomfort   Gastrointestinal: positive for       + nausea   Neurological: positive for       + headaches;dizziness

## 2024-06-24 NOTE — Progress Notes (Unsigned)
 HEMATOLOGY/ONCOLOGY, [facility]  [address]  [place] [address]  Operated by [facility]  Oncology clinic progress note     Name: [name] MRN:  [id number]   Date of Birth: 25-Jun-1956 Age: 68 y.o.   Date: 06/24/2024       Provider: Norleen Amen, MD  PCP: No Pcp  Referring Provider: Ami LITTIE Mohr     Reason for visit:   Chief Complaint   Patient presents with    Follow Up        HPI:  [name] is a pleasant 68 y.o. female who has presents for hospital follow-up.  She was recently hospitalized with flank pain radiating to the chest with imaging revealing a right lower lobe mass.  She underwent bronchoscopy and biopsy.  She is here to discuss the reports.      Oncologic history is summarized below.      HPI during hospitalization 06/15/2024  [name] is a 68 y.o. female who presents  to the ED with complaints of ear pain and flank pain radiating to the abdomen and chest.  Pain is described as sharp.  Reports pain radiates to abdomen and chest.  With associated headaches, generalized weakness, and loss of appetite.  On presentation to the ED, vital signs within normal limits.  Laboratory workup essentially unremarkable.  CT chest with right lower lobe mass and right adenopathy suspicious for malignancy.  Both pulmonology and oncology has been consulted.  Patient does endorse prior history of breast CA with subsequent bilateral mastectomy.  Denies ever receiving CRT.  Does not currently follow with Oncology on routine basis.  Does endorse daily tobacco use/cigarette smoking along with regular marijuana use.  Denies frequent EtOH use.  Denies history of VTE such as deep vein thrombosis or PE.  Denies history of bleeding or clotting disorders.  Does endorse familial history of CA.  Denies fever, chills, night sweats, or unintentional weight loss.  Does endorse loss of appetite as previously stated above.  At time of initial assessment, she denies acute blood  loss, dyspnea, N/V/D, dizziness/lightheadedness, palpitations, or other constitutional complaints at this time.        Oncologic history:    Extensive stage small-cell lung cancer, right lower lobe, liver Mets  September 25    CT chest: 9/25:   Lung mass with suggested hepatic metastases as evidenced by CT CAP.        EBUS bronchoscopy with biopsy  06/17/2024.    CEA mildly elevated at 5.5.    CA-125 and AFP are normal at 7.5 and 3.7 respectively.       06/17/2024 Flexible fiberoptic bronchoscopy Left lower lobe bronchoalveolar lavage.Endobronchial ultrasound guided biopsy of station # 7 X 5    Last Pathology/Cytology Result   CYTOPATHOLOGY, FINE NEEDLE ASPIRATE (Collected: 06/17/2024 )   Result Value Ref Range    Interpretation       A. Lymph Node, Station 7, Fine Needle Aspiration:   Satisfactory for evaluation.  POSITIVE FOR MALIGNANCY (SMALL CELL CARCINOMA).  Cell Block Examined and Contributory          Last Pathology/Cytology Result   CYTOPATHOLOGY, FINE NEEDLE ASPIRATE (Collected: 06/17/2024 12:21 PM)   Result Value Ref Range    Interpretation       A. Lymph Node, Station 7, Fine Needle Aspiration:   Satisfactory for evaluation.  POSITIVE FOR MALIGNANCY (SMALL CELL CARCINOMA).  Cell Block Examined and Contributory  Comments       The tumor cells are positive for synaptophysin, CD56, chromogranin (scattered weak), pancytokeratin (neuroendocrine pattern) and TTF1 IHC stains. The Ki67 shows moderate increased.  The tumor is negative for Napsin immunohistochemical stain.      Rapid On-Site Evaluation       Lymph Node, Station 7, Fine Needle Aspiration:  Pass 1: Adequate, 2 Diff Quick Slides      Rapid On-Site Evaluation Disclaimer       The Rapid On-Site Evaluation that follows is the preliminary report that was issued at the time of the procedure based on the material available.      Gross Description       A: Lymph Node Aspirate  FNA LYMPH NODE: STATION 7    Received 2 smears that were assessed  intraoperatively and of red/bloody fixed fluid. Created ThinPrep and attempted Cellblock.    Pass 1 consists of slides 2 and 3.       CYTOPATHOLOGY, NON GYN (Collected: 06/17/2024 12:21 PM)   Result Value Ref Range    Interpretation       A. Lung, Left Upper Lobe, Bronchial Washings:  Satisfactory for evaluation.  NEGATIVE FOR OBVIOUS MALIGNANCY  Bronchial epithelial cells, alveolar macrophages, and inflammatory cells.  Cell Block Examined        Comments       Clinical and culture results correlation is recommended.      Gross Description       A: Bronchial Washings  BRONCHIAL WASHINGS: LEFT UPPER LOBE    Received 15ml of clear colorless fluid with white debris. Created ThinPrep and attempted Cellblock.           Past Medical History:  Past Medical History:   Diagnosis Date    Abdominal pain     Cancer (CMS HCC)     Chest pain     Chronic lung disease     Coronary artery disease     Dyslipidemia     Essential hypertension     Heart disease     History of percutaneous coronary intervention     History of stress test     Mixed hyperlipidemia     Unspecified chronic bronchitis      Past Surgical History:   Procedure Laterality Date    CARDIAC CATHETERIZATION      CORONARY ANGIOPLASTY      CORONARY ARTERY STENT PLACEMENT      HX BACK SURGERY      HX CORONARY ARTERY BYPASS GRAFT      HX MASTECTOMY, SIMPLE Bilateral     HX TUBAL LIGATION      NECK SURGERY        Allergies[1]  Current Outpatient Medications   Medication Sig    anastrozole  (ARIMIDEX ) 1 mg Oral Tablet Take 1 Tablet (1 mg total) by mouth Daily    aspirin  81 mg Oral Tablet, Chewable Chew 1 Tablet (81 mg total) Once a day    atorvastatin  (LIPITOR) 40 mg Oral Tablet Take 1 Tablet (40 mg total) by mouth Daily    cholecalciferol , vitamin D3, 25 mcg (1,000 unit) Oral Tablet Take 1 Tablet (1,000 Units total) by mouth Daily    clonazePAM  (KLONOPIN ) 0.5 mg Oral Tablet Take 1 Tablet (0.5 mg total) by mouth Twice per day as needed for Other (ANXIETY)    DULoxetine   (CYMBALTA  DR) 60 mg Oral Capsule, Delayed Release(E.C.) Take 1 Capsule (60 mg total) by mouth Daily    fluticasone  propionate (FLONASE )  50 mcg/actuation Nasal Spray, Suspension 1 Spray Once per day as needed for Other (CONGESTION)    gabapentin  (NEURONTIN ) 400 mg Oral Capsule Take 1 Capsule (400 mg total) by mouth Three times a day    losartan  (COZAAR ) 25 mg Oral Tablet Take 1 Tablet (25 mg total) by mouth Daily    metoprolol  tartrate (LOPRESSOR ) 25 mg Oral Tablet Take 1 Tablet (25 mg total) by mouth Twice daily    MV,Ca,Ir,Min-FA-Lut-Lyco-HC153 3-133-33 mg-mcg-mcg Oral Capsule Take 1 Capsule by mouth Daily    omega-3 fatty acid (LOVAZA ) 1 gram Oral Capsule Take 1 Capsule (1 g total) by mouth Daily    oxyCODONE -acetaminophen  (PERCOCET) 7.5-325 mg Oral Tablet Take 1 Tablet by mouth Every 4 hours as needed for Pain    pantoprazole  (PROTONIX ) 40 mg Oral Tablet, Delayed Release (E.C.) Take 1 Tablet (40 mg total) by mouth Daily    promethazine  (PHENERGAN ) 25 mg Oral Tablet Take 1 Tablet (25 mg total) by mouth Every 8 hours as needed for Nausea/Vomiting    traZODone  (DESYREL ) 150 mg Oral Tablet Take 1 Tablet (150 mg total) by mouth Every night     Family Medical History:       Problem Relation (Age of Onset)    Arthritis Mother, Maternal Grandmother    Cancer Father, Other    Heart Attack Father, Other    Heart Disease Brother, Other    Hypertension (High Blood Pressure) Mother, Father, Other    Pacemaker Mother           Social History     Socioeconomic History    Marital status: Widowed   Tobacco Use    Smoking status: Every Day     Current packs/day: 0.50     Types: Cigarettes    Smokeless tobacco: Never   Vaping Use    Vaping status: Never Used   Substance and Sexual Activity    Alcohol use: Yes     Comment: occasional    Drug use: Yes     Types: Marijuana     Social Determinants of Health     Social Connections: Low Risk (06/14/2024)    Social Connections     SDOH Social Isolation: 5 or more times a week       Review  of Systems:  Review of Systems   Constitutional:  Negative for chills, diaphoresis, fatigue, fever and unexpected weight change.   HENT:  Negative for mouth sores, nosebleeds, sinus pain, sore throat, tinnitus, trouble swallowing and voice change.    Eyes:  Negative for visual disturbance.   Respiratory: Negative.  Negative for cough, chest tightness, shortness of breath, wheezing and stridor.    Cardiovascular:  Negative for chest pain, palpitations and leg swelling.   Gastrointestinal:  Negative for abdominal distention, abdominal pain, anal bleeding, blood in stool, constipation, diarrhea, nausea, rectal pain and vomiting.   Endocrine: Negative for cold intolerance, heat intolerance, polydipsia and polyuria.   Genitourinary:  Negative for difficulty urinating, dysuria, flank pain, frequency, hematuria and urgency.   Musculoskeletal:  Negative for arthralgias, back pain, gait problem, joint swelling and myalgias.   Skin:  Negative for color change, pallor, rash and wound.   Allergic/Immunologic: Negative for immunocompromised state.   Neurological:  Negative for dizziness, tremors, seizures, syncope, facial asymmetry, speech difficulty, weakness, light-headedness, numbness and headaches.   Hematological:  Negative for adenopathy. Does not bruise/bleed easily.   Psychiatric/Behavioral:  Negative for confusion, dysphoric mood, hallucinations, self-injury, sleep disturbance and suicidal ideas. The patient is  not nervous/anxious.         Objective:  Vital Signs:BP (!) 164/95   Pulse 91   Temp 36.3 C (97.3 F)   Ht 1.549 m (5' 1)   Wt 61.2 kg (135 lb)   SpO2 96%   BMI 25.51 kg/m       Physical Exam  Constitutional:       General: She is not in acute distress.     Appearance: Normal appearance. She is not ill-appearing.   HENT:      Head: Normocephalic and atraumatic.      Nose: Nose normal.      Mouth/Throat:      Mouth: Mucous membranes are moist.      Pharynx: No posterior oropharyngeal erythema.   Eyes:       General: No scleral icterus.     Extraocular Movements: Extraocular movements intact.      Conjunctiva/sclera: Conjunctivae normal.      Pupils: Pupils are equal, round, and reactive to light.   Cardiovascular:      Rate and Rhythm: Normal rate and regular rhythm.   Pulmonary:      Effort: Pulmonary effort is normal. No respiratory distress.      Breath sounds: Normal breath sounds. No wheezing, rhonchi or rales.   Chest:      Chest wall: No tenderness.   Abdominal:      General: Bowel sounds are normal. There is no distension.      Palpations: Abdomen is soft. There is no mass.      Tenderness: There is no abdominal tenderness. There is no right CVA tenderness, left CVA tenderness, guarding or rebound.   Musculoskeletal:         General: No swelling. Normal range of motion.      Cervical back: Neck supple.      Right lower leg: No edema.      Left lower leg: No edema.   Lymphadenopathy:      Cervical: No cervical adenopathy.   Skin:     Coloration: Skin is not jaundiced.      Findings: No erythema or rash.   Neurological:      General: No focal deficit present.      Mental Status: She is alert and oriented to person, place, and time.      Cranial Nerves: No cranial nerve deficit.      Sensory: No sensory deficit.      Motor: No weakness.   Psychiatric:         Mood and Affect: Mood normal.         Labs:  COMPREHENSIVE METABOLIC PANEL  Lab Results   Component Value Date    SODIUM 142 06/17/2024    POTASSIUM 4.1 06/17/2024    CHLORIDE 108 (H) 06/17/2024    CO2 24 06/17/2024    ANIONGAP 10 06/17/2024    BUN 10 06/17/2024    CREATININE 0.62 06/17/2024    GLUCOSENF 102 06/17/2024    GLUCOSE Negative 06/14/2024    CALCIUM 9.6 06/17/2024    PHOSPHORUS 3.6 06/17/2024    ALBUMIN 3.6 06/14/2024    TOTALPROTEIN 7.0 06/14/2024    ALKPHOS 102 06/14/2024    AST 36 (H) 06/14/2024    ALT 12 06/14/2024    GFR >90 06/17/2024                COMPLETE BLOOD COUNT   Lab Results   Component Value Date    WBC 6.3 06/17/2024  HGB 11.8  06/17/2024    HCT 37.9 06/17/2024    PLTCNT 185 06/17/2024       DIFFERENTIAL  Lab Results   Component Value Date    PMNS 33 06/17/2024    LYMPHOCYTES 51 06/17/2024    MONOCYTES 7.3 06/15/2024    BASOPHILS 0 06/17/2024    PMNABS 4.83 06/15/2024    LYMPHSABS 1.74 06/15/2024    EOSABS 0.16 06/15/2024    MONOSABS 0.54 06/15/2024            Imaging:  Recent Results (from the past 2160 hours)   XR CHEST AP     Status: Abnormal    Narrative    [name]    XR CHEST AP, 06/14/2024 1:25 PM    INDICATION:  dyspnea, respiratory illness   Additional History:  dyspnea, respiratory illness    TECHNIQUE:  Single portable frontal view of the chest.  1 views/1 images submitted for interpretation.  COMPARISON: 09/14/2023.  ___________________________________  FINDINGS:    The heart is within normal limits for size. Median sternotomy wires, inferior surgical fusion, and numerous bilateral breast surgical clips are stable. The right hilum is enlarged. No effusion, infiltrate, or pneumothorax is seen. Bilateral apical pulmonary scar is stable. There is mild dextrocurvature of the thoracic spine.  ___________________________________    Impression    Right hilar enlargement may represent mass, adenopathy or vascular enlargement. Enhanced CT chest would be the test of choice for further evaluation.      A Significant Orange actionable finding has been sent via the [employer] Actionable Findings application on 06/14/2024 1:55 PM, Message ID [id number]. Receipt of this communication will be communicated to [facility] RADIOLOGY STAFF or responsible provider and will be documented in [employer] Actionable Findings System upon receiving the acknowledgement.      Radiologist location ID: [id number]     US  GALLBLADDER     Status: None    Narrative    [name]    US  GALLBLADDER performed on 06/14/2024 1:52 PM.    INDICATION:  prior CT questioned cholelithiasis, later ruled out by repeat CT and US , presents back today with  nausea/vomiting/RUQ pain, mild transaminitis   Additional History:  abdominal pain; nausea and vomiting    TECHNIQUE:  Transabdominal grayscale sonogram of the gallbladder and common bile duct.      COMPARISON:  CT abdomen/pelvis 01/12/2024.  ___________________________________  FINDINGS:    The common bile duct measures up to 7.6 mm in caliber, mildly enlarged for age but similar to 01/12/2024.  Gallbladder wall thickness: 1.9 mm    The gallbladder is unremarkable in size and wall thickness, without pericholecystic fluid or cholelithiasis.  ___________________________________    Impression    Unremarkable gallbladder.  Mild chronic common bile duct dilation.      Radiologist location ID: [id number]     CT BRAIN WO IV CONTRAST     Status: None    Narrative    [name]    CT BRAIN WO IV CONTRAST  06/14/2024 2:48 PM    INDICATION:  generalized malaise, generally unwell, n/v, chest mass on XR imaging, further character/staging  Body aches    TECHNIQUE: Head CT performed without IV contrast. Dose modulation, automated exposure control, and/or iterative reconstruction were used for dose reduction.    COMPARISON:  11/08/2006.    FINDINGS:  No evidence of acute intracranial hemorrhage, mass, or acute infarct.    No midline shift.  CSF spaces are relatively symmetric.   Numerous  white matter hypodensities.  Well aerated paranasal sinuses and mastoids.          Impression    No evidence of acute intracranial disease.    Findings compatible with chronic small vessel ischemic changes.        Note that in acute stroke, findings may not be visible on CT for the first 48 hours.       Radiologist location ID: [id number]     CT CHEST ABDOMEN PELVIS W IV CONTRAST     Status: None    Narrative    [name]    CT CHEST ABDOMEN PELVIS W IV CONTRAST performed on 06/14/2024 2:55 PM.    INDICATION:  generalized malaise, generally unwell, n/v, chest mass on XR imaging, further character/staging   Additional History:  Body aches,  vomiting, abdominal pain    TECHNIQUE:  CT chest, abdomen, and pelvis with axial, coronal, and sagittal multiplanar reformations, performed with IV contrast. Dose modulation, automated exposure control, and/or iterative reconstruction were used for dose reduction.    CONTRAST:  75 mL Isovue  370 IV    COMPARISON:  Same date CXR. CT abdomen/pelvis 01/12/2024. CT chest 12/28/2023..  ___________________________________  FINDINGS:    Chest:  * Right lower cervical and supraclavicular adenopathy including a 2 cm supraclavicular node. Multistation mediastinal adenopathy involving the prevascular, bilateral paratracheal, and subcarinal stations, for example a 2 cm right lower paratracheal node and 2.8 cm subcarinal node. Right hilar adenopathy measuring up to 1.9 cm.  * No aortic aneurysm or central pulmonary embolism. Atherosclerosis with CAD post CABG.  * No pleural effusion.  * Emphysema. New branching expansile changes along the bronchovascular bundles of the right lower lobe with local nodularity, malignancy until otherwise proven. Otherwise no focal consolidation, pulmonary edema or mass is identified. Similar apical right upper lobe scarring.  * Patent central airways. Proximal right bronchial narrowing through the level of the mediastinal adenopathy.  * No aggressive osseous lesion is identified. Degenerative disc disease. Postsurgical changes in the bilateral breasts with scattered surgical clips.    Abdomen/pelvis:  * Multiple new indistinct subcentimeter hypodensities in the right hemiliver suspicious for metastasis.  * Normal caliber gallbladder and bile ducts.  * No splenomegaly.  * No pancreatic inflammation or ductal dilation.  * No adrenal nodule.  * Bilateral nephrolithiasis an small renal cysts. No hydronephrosis or identified mass.  * Normal caliber bowel without overt inflammatory changes. Colonic diverticulosis.  * No free fluid or gas, bulky adenopathy or aortic aneurysm. Advanced  atherosclerosis.  * Unremarkable uterus, adnexa and urinary bladder.  * No aggressive osseous lesion.    ___________________________________    Impression    Chest:  1. Right lower cervical, supraclavicular, mediastinal and right hilar adenopathy compatible with malignancy.  2. New expansile soft tissue thickening along the bronchovascular bundles of the right lower lobe, considered malignancy until otherwise proven.  3. Emphysema.    Abdomen/pelvis:  1. New subcentimeter hypoechoic hepatic lesions, presumably metastatic.  2. Additional chronic and incidental findings as above.    PET/CT may be of diagnostic benefit.            Radiologist location ID: [id number]     XR AP MOBILE CHEST     Status: None    Narrative    [name]    XR AP MOBILE CHEST performed on 06/17/2024 12:46 PM.    INDICATION:  Please comment on presence or abscence of pneumothorax in impression - post bronch  Additional History:  Please comment on presence or abscence of pneumothorax in impression - post bronch    TECHNIQUE:  Single portable frontal view of the chest.  1 views/1 images submitted for interpretation.    COMPARISON:  Chest x-ray 06/14/2024  ___________________________________  FINDINGS:    Right hilar mass redemonstrated. Median sternotomy noted. Heart size and vascularity are normal. Surgical clips in the chest wall.  Slightly asymmetric opacity at the right lung apex and right lung base, possible pneumonia or atelectasis.    No pneumothorax. Emphysema with blebs at the lung apices. ___________________________________    Impression    1. No pneumothorax post bronchoscopy.    2. Right hilar mass.    3. Opacity right lung apex and right lung base, may be aspiration or atelectasis.        Radiologist location ID: [id number]        Recent Results (from the past 824799999 hours)   CT CHEST ABDOMEN PELVIS W IV CONTRAST    Collection Time: 06/14/24  2:55 PM    Narrative    [name]    CT CHEST ABDOMEN PELVIS W IV  CONTRAST performed on 06/14/2024 2:55 PM.    INDICATION:  generalized malaise, generally unwell, n/v, chest mass on XR imaging, further character/staging   Additional History:  Body aches, vomiting, abdominal pain    TECHNIQUE:  CT chest, abdomen, and pelvis with axial, coronal, and sagittal multiplanar reformations, performed with IV contrast. Dose modulation, automated exposure control, and/or iterative reconstruction were used for dose reduction.    CONTRAST:  75 mL Isovue  370 IV    COMPARISON:  Same date CXR. CT abdomen/pelvis 01/12/2024. CT chest 12/28/2023..  ___________________________________  FINDINGS:    Chest:  * Right lower cervical and supraclavicular adenopathy including a 2 cm supraclavicular node. Multistation mediastinal adenopathy involving the prevascular, bilateral paratracheal, and subcarinal stations, for example a 2 cm right lower paratracheal node and 2.8 cm subcarinal node. Right hilar adenopathy measuring up to 1.9 cm.  * No aortic aneurysm or central pulmonary embolism. Atherosclerosis with CAD post CABG.  * No pleural effusion.  * Emphysema. New branching expansile changes along the bronchovascular bundles of the right lower lobe with local nodularity, malignancy until otherwise proven. Otherwise no focal consolidation, pulmonary edema or mass is identified. Similar apical right upper lobe scarring.  * Patent central airways. Proximal right bronchial narrowing through the level of the mediastinal adenopathy.  * No aggressive osseous lesion is identified. Degenerative disc disease. Postsurgical changes in the bilateral breasts with scattered surgical clips.    Abdomen/pelvis:  * Multiple new indistinct subcentimeter hypodensities in the right hemiliver suspicious for metastasis.  * Normal caliber gallbladder and bile ducts.  * No splenomegaly.  * No pancreatic inflammation or ductal dilation.  * No adrenal nodule.  * Bilateral nephrolithiasis an small renal cysts. No hydronephrosis or  identified mass.  * Normal caliber bowel without overt inflammatory changes. Colonic diverticulosis.  * No free fluid or gas, bulky adenopathy or aortic aneurysm. Advanced atherosclerosis.  * Unremarkable uterus, adnexa and urinary bladder.  * No aggressive osseous lesion.    ___________________________________    Impression    Chest:  1. Right lower cervical, supraclavicular, mediastinal and right hilar adenopathy compatible with malignancy.  2. New expansile soft tissue thickening along the bronchovascular bundles of the right lower lobe, considered malignancy until otherwise proven.  3. Emphysema.    Abdomen/pelvis:  1. New subcentimeter hypoechoic hepatic lesions, presumably metastatic.  2.  Additional chronic and incidental findings as above.    PET/CT may be of diagnostic benefit.            Radiologist location ID: [id number]        Recent Results (from the past [id number] hours)   CT CHEST WO IV CONTRAST    Collection Time: 12/28/23  7:06 AM    Narrative    [name]    CT CHEST WO IV CONTRAST performed on 12/28/2023 7:06 AM.    INDICATION:  R07.89: Sternal pain   Additional History:  cough, shortness of breath, sternal pain, hx smoker, open heart surgery    TECHNIQUE:  Unenhanced CT chest with axial, coronal, and sagittal multiplanar reformations. Dose modulation, automated exposure control, and/or iterative reconstruction were used for dose reduction.    COMPARISON:  12/18/2009.  ___________________________________  FINDINGS:    * No mediastinal or axillary adenopathy.  * Atherosclerosis with nonaneurysmal aortic and coronary artery involvement post CABG and possibly stenting.  * Normal heart size.  * No pleural effusion.  * Mild emphysema. No focal consolidation, pulmonary edema or mass.  * Patent central airways.  * Bilateral nephrolithiasis. Nondistended gallbladder with cholelithiasis.  * No pertinent osseous or body wall findings. Unremarkable sternotomy. Bilateral  mastectomy.    ___________________________________    Impression    1. No acute or suspicious findings in the chest.  2. Emphysema.  3. Incidental cholelithiasis and nephrolithiasis.       Radiologist location ID: [id number]       68 year old female presenting with lung mass:       1. Right lower lobe lobe small cell lung cancer, Extensive stage (liver Mets) :    68 year old female with extensive stage small-cell lung cancer (SCLC):  She presents with a right upper lobe small-cell lung mass with mediastinal lymph node involvement and suggested hepatic metastases on CT, consistent with extensive stage disease. Pathology from EBUS-guided biopsy confirmed SCLC of lung primary. CEA is mildly elevated at 5.5, while CA-125 and AFP are normal. Brain MRI is contraindicated due to retained metal; CT head is negative for metastases. Prognosis and palliative intent of treatment were discussed extensively with the patient.  Systemic Therapy: Initiate urgent chemoimmunotherapy with Carboplatin (AUC 5 IV D1), Etoposide (100 mg/m IV D1-3), and Atezolizumab (1200 mg IV D1) every 21 days  4 cycles, followed by maintenance Tecentriq. Growth factor support will be provided given high risk of febrile neutropenia.  Monitoring: Weekly CBC; renal and hepatic function prior to each cycle. PET-CT after 4 cycles for restaging.  Supportive Care: Prescriptions for antiemetics, aggressive oral hydration, and patient education provided. ER precautions reviewed (fever >100.9F, intractable GI symptoms, chest pain, dyspnea, neuro changes).  Toxicity Check: RTC in 2 weeks for assessment.  Counseling: Detailed discussion of chemotherapy (cytopenias, infection risk, nephrotoxicity, neuropathy, leukemogenic potential) and immunotherapy (immune-related adverse events including pneumonitis, colitis, endocrinopathies, nephritis, hyper-progression). Informed consents signed.  2. Nicotine dependence: Current daily smoker. Counseled extensively on  cessation; encouraged pharmacologic and behavioral support.  3. History of breast cancer: Prior bilateral mastectomy, no history of chemo/radiation therapy. No current oncology follow-up. 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      CAD:  History of CABG x1.  On cardioprotective medication 81 mg ASA daily.     Hyperlipidemia: On statin.     Hypertension: Managed with antihypertensives.      7.  Paroxysmal atrial fib:  Currently NSR.  On beta-blocker.  RTC Return in about 2 weeks (around 07/08/2024).      ICD-10-CM    1. Metastatic cancer to liver (CMS HCC)  C78.7       2. 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 Paroxysmal atrial fibrillation (CMS HCC)  I48.0       4. Primary hypertension  I10       5. Hyperlipidemia, unspecified hyperlipidemia type  E78.5       6. Coronary artery disease, unspecified vessel or lesion type, unspecified whether angina present, unspecified whether native or transplanted heart  I25.10       7. Mediastinal adenopathy  R59.0       8. Tobacco use  Z72.0       9. Cigarette nicotine dependence, uncomplicated  F17.210           Norleen Amen, MD          Portions of the current encounter note  have been copied from  the previous encounter which have been reviewed and updated where appropriate and reflects current medical decision making for today's encounter.      Portions of this note may be dictated using voice recognition software or a dictation service. Variances in spelling and vocabulary are possible and unintentional. Not all errors are caught/corrected. Please notify the dino if any discrepancies are noted or if the meaning of any statement is not clear.          [1] No Known Allergies

## 2024-06-25 ENCOUNTER — Ambulatory Visit (INDEPENDENT_AMBULATORY_CARE_PROVIDER_SITE_OTHER): Payer: Self-pay

## 2024-06-26 ENCOUNTER — Encounter (HOSPITAL_BASED_OUTPATIENT_CLINIC_OR_DEPARTMENT_OTHER): Payer: Self-pay | Admitting: HEMATOLOGY-ONCOLOGY

## 2024-06-27 ENCOUNTER — Encounter (HOSPITAL_BASED_OUTPATIENT_CLINIC_OR_DEPARTMENT_OTHER): Payer: Self-pay | Admitting: HEMATOLOGY-ONCOLOGY

## 2024-06-28 ENCOUNTER — Telehealth (INDEPENDENT_AMBULATORY_CARE_PROVIDER_SITE_OTHER): Payer: Self-pay | Admitting: PHYSICIAN ASSISTANT

## 2024-06-28 ENCOUNTER — Other Ambulatory Visit: Payer: Self-pay

## 2024-06-28 ENCOUNTER — Ambulatory Visit: Payer: Self-pay | Attending: PHYSICIAN ASSISTANT | Admitting: PHYSICIAN ASSISTANT

## 2024-06-28 ENCOUNTER — Encounter (INDEPENDENT_AMBULATORY_CARE_PROVIDER_SITE_OTHER): Payer: Self-pay | Admitting: PHYSICIAN ASSISTANT

## 2024-06-28 ENCOUNTER — Other Ambulatory Visit (INDEPENDENT_AMBULATORY_CARE_PROVIDER_SITE_OTHER): Payer: Self-pay | Admitting: PHYSICIAN ASSISTANT

## 2024-06-28 VITALS — BP 135/85 | HR 83 | Temp 96.9°F | Resp 16 | Ht 61.0 in | Wt 134.0 lb

## 2024-06-28 DIAGNOSIS — F17210 Nicotine dependence, cigarettes, uncomplicated: Secondary | ICD-10-CM | POA: Insufficient documentation

## 2024-06-28 DIAGNOSIS — I2510 Atherosclerotic heart disease of native coronary artery without angina pectoris: Secondary | ICD-10-CM | POA: Insufficient documentation

## 2024-06-28 DIAGNOSIS — J449 Chronic obstructive pulmonary disease, unspecified: Secondary | ICD-10-CM

## 2024-06-28 DIAGNOSIS — C3431 Malignant neoplasm of lower lobe, right bronchus or lung: Secondary | ICD-10-CM | POA: Insufficient documentation

## 2024-06-28 DIAGNOSIS — R0602 Shortness of breath: Secondary | ICD-10-CM | POA: Insufficient documentation

## 2024-06-28 DIAGNOSIS — I480 Paroxysmal atrial fibrillation: Secondary | ICD-10-CM | POA: Insufficient documentation

## 2024-06-28 DIAGNOSIS — J410 Simple chronic bronchitis: Secondary | ICD-10-CM | POA: Insufficient documentation

## 2024-06-28 MED ORDER — OXYCODONE-ACETAMINOPHEN 7.5 MG-325 MG TABLET
1.0000 | ORAL_TABLET | ORAL | 0 refills | Status: AC | PRN
Start: 2024-06-28 — End: ?
  Filled 2024-06-28 (×2): qty 60, 10d supply, fill #0

## 2024-06-28 NOTE — Nursing Note (Signed)
 Is the patient currently on oxygen therapy? No  What Liter Flow is the patient on? N/A  What DME company does the patient utilize for oxygen therapy? N/A  What is the patient on? n/a  What DME company does the patient utilize for CPAP/BiPAP/Trilogy? N/A  Smoking History:    Tobacco Use History[1]   Have you received a flu shot? Yes  Have you received a pneumonia shot? Yes  Has the patient had any tests performed since the last visit? CT and Chest Xray  If so, where were the test(s) performed? [facility]  Has the patient had any recent hospitalizations? No  Does the patient have any other associated symptoms or modifying factors? Shortness of breath, Wheezing, and Coughing             [1]   Social History  Tobacco Use   Smoking Status Every Day    Current packs/day: 0.50    Types: Cigarettes   Smokeless Tobacco Never

## 2024-06-28 NOTE — Telephone Encounter (Signed)
 Please refer patient to Palliative Care (dx: small cell lung cancer).  Please contact pt's daughters eitherBETHA Sartorius [phone] or Tiffany [phone].  Thanks!

## 2024-06-28 NOTE — Progress Notes (Signed)
 PULMONARY, [facility] PULMONOLOGY  [address]  [place] [address]  Operated by [facility]     Follow up/Progress Note    Patient Name: [name]  Date: 06/28/2024  Department:  PULMONARY, [facility] PULMONOLOGY  MRN: [id number]  DOB: 03-Aug-1956  Primary Care Provider:  No Pcp  Referring Provider:  Comer FORBES Friend, PA-C      Chief Complaint:   Chief Complaint   Patient presents with    Follow Up     Nursing Notes:   Sammy Browning, [place]  06/28/24 9040  Signed  Is the patient currently on oxygen therapy? No  What Liter Flow is the patient on? N/A  What DME company does the patient utilize for oxygen therapy? N/A  What is the patient on? n/a  What DME company does the patient utilize for CPAP/BiPAP/Trilogy? N/A  Smoking History:    Tobacco Use History[1]   Have you received a flu shot? Yes  Have you received a pneumonia shot? Yes  Has the patient had any tests performed since the last visit? CT and Chest Xray  If so, where were the test(s) performed? [facility]  Has the patient had any recent hospitalizations? No  Does the patient have any other associated symptoms or modifying factors? Shortness of breath, Wheezing, and Coughing          HPI:  Patient seen in hospital consult during admission.  Patient initially presented to the ER with abdominal pain in ultimately was found to have lung mass.  Patient ultimately underwent bronchoscopy with path showing small-cell carcinoma.  Patient was seen by Oncology earlier this week.  Plan is to start chemo next week.  PET has been ordered and is pending but Oncology wanted to get chemo started ASAP.    [name] at baseline from a pulmonary standpoint.  Has variable shortness breath.  Has some mild cough.  Denies chest pain, lower extremity edema, fever.  She is having a lot of leg pain.  She is almost out of Percocet prescribed at discharge.  She continues to smoke around 1/2 pack per day.    Patient has a history of Chronic Obstructive  Pulmonary Disease and previously followed at [facility] pulmonary but has not done so for little while.  She admits to an at least 25 pack-year history of tobacco abuse and currently smokes around 1/2 pack per day.    Past Medical History:  Past Medical History:   Diagnosis Date    Abdominal pain     Cancer (CMS HCC)     Chest pain     Chronic lung disease     Coronary artery disease     Dyslipidemia     Essential hypertension     Heart disease     History of percutaneous coronary intervention     History of stress test     Mixed hyperlipidemia     Unspecified chronic bronchitis      Past Surgical History  Past Surgical History:   Procedure Laterality Date    CARDIAC CATHETERIZATION      CORONARY ANGIOPLASTY      CORONARY ARTERY STENT PLACEMENT      HX BACK SURGERY      HX CORONARY ARTERY BYPASS GRAFT      HX MASTECTOMY, SIMPLE Bilateral     HX TUBAL LIGATION      NECK SURGERY       Medication List  Current Outpatient Medications  Medication Sig    anastrozole  (ARIMIDEX ) 1 mg Oral Tablet Take 1 Tablet (1 mg total) by mouth Daily    aspirin  81 mg Oral Tablet, Chewable Chew 1 Tablet (81 mg total) Once a day    atorvastatin  (LIPITOR) 40 mg Oral Tablet Take 1 Tablet (40 mg total) by mouth Daily    cholecalciferol , vitamin D3, 25 mcg (1,000 unit) Oral Tablet Take 1 Tablet (1,000 Units total) by mouth Daily    clonazePAM  (KLONOPIN ) 0.5 mg Oral Tablet Take 1 Tablet (0.5 mg total) by mouth Twice per day as needed for Other (ANXIETY)    DULoxetine  (CYMBALTA  DR) 60 mg Oral Capsule, Delayed Release(E.C.) Take 1 Capsule (60 mg total) by mouth Daily    fluticasone  propionate (FLONASE ) 50 mcg/actuation Nasal Spray, Suspension 1 Spray Once per day as needed for Other (CONGESTION)    gabapentin  (NEURONTIN ) 400 mg Oral Capsule Take 1 Capsule (400 mg total) by mouth Three times a day    losartan  (COZAAR ) 25 mg Oral Tablet Take 1 Tablet (25 mg total) by mouth Daily    metoprolol  tartrate (LOPRESSOR ) 25 mg Oral  Tablet Take 1 Tablet (25 mg total) by mouth Twice daily    MV,Ca,Ir,Min-FA-Lut-Lyco-HC153 3-133-33 mg-mcg-mcg Oral Capsule Take 1 Capsule by mouth Daily    omega-3 fatty acid (LOVAZA ) 1 gram Oral Capsule Take 1 Capsule (1 g total) by mouth Daily    oxyCODONE -acetaminophen  (PERCOCET) 7.5-325 mg Oral Tablet Take 1 Tablet by mouth Every 4 hours as needed for Pain    pantoprazole  (PROTONIX ) 40 mg Oral Tablet, Delayed Release (E.C.) Take 1 Tablet (40 mg total) by mouth Daily    promethazine  (PHENERGAN ) 25 mg Oral Tablet Take 1 Tablet (25 mg total) by mouth Every 8 hours as needed for Nausea/Vomiting    traZODone  (DESYREL ) 150 mg Oral Tablet Take 1 Tablet (150 mg total) by mouth Every night     Allergy List  Allergy History as of 06/28/24        No Known Allergies                  Family History   Family Medical History:       Problem Relation (Age of Onset)    Arthritis Mother, Maternal Grandmother    Cancer Father, Other    Heart Attack Father, Other    Heart Disease Brother, Other    Hypertension (High Blood Pressure) Mother, Father, Other    Pacemaker Mother            Social History  Social History     Socioeconomic History    Marital status: Widowed   Tobacco Use    Smoking status: Every Day     Current packs/day: 0.50     Average packs/day: 0.5 packs/day for 52.7 years (26.4 ttl pk-yrs)     Types: Cigarettes     Start date: 1973    Smokeless tobacco: Never   Vaping Use    Vaping status: Never Used   Substance and Sexual Activity    Alcohol use: Yes     Comment: occasional    Drug use: Yes     Types: Marijuana     Social Determinants of Health     Social Connections: Low Risk (06/14/2024)    Social Connections     SDOH Social Isolation: 5 or more times a week        Review of system  General:  Denies fever, chills, night sweats, fatigue, loss of appetite.  Neurological:  Denies headache, snoring.   Gastrointestinal:  Denies reflux, heartburn, diarrhea.  Cardiovascular:  Denies chest pain, irregular  heartbeats.  Musculoskeletal:  Denies joint pain, restless legs.  Endocrine/Metabolic:  Denies weight gain, weight loss.  Immunologic:  Denies sinus allergy symptoms.  Mental Status/Psychiatric:  Denies anxiety, depression.  ENT:  Denies nasal congestion, hoarseness, postnasal drip.  Integumentary:  Denies rash, new skin lesions.    Objective:  Vital Signs  Vitals:    06/28/24 0953   BP: 135/85   Pulse: 83   Resp: 16   Temp: 36.1 C (96.9 F)   SpO2: 96%   Weight: 60.8 kg (134 lb)   Height: 1.549 m (5' 1)   BMI: 25.32         PHYSICAL EXAMINATION:   Constitutional:  Vital signs stable.  General appearance of the patient:  Alert, no acute distress.  Normal appearance, well nourished.  Eyes: PERRLA and normal eye lids.  Conjunctiva normal.  Ears, Nose, Mouth, and Throat: External inspection of ears and nose with normal appearance.  Inspection of lips, teeth and gums with normal appearance and oral mucosa normal.  Neck: Supple with trachea midline, non tender, no nodules, no masses, gland position midline.  Respiratory:  Auscultation of lungs with normal breath sounds, no rales, no rhonchi, no wheezing.  Respiratory effort with no tractions, breathing regular and unlabored.  Cardiovascular:  Regular rhythm and regular rate.  No murmur, no peripheral edema.  Gastrointestinal: Abdomen non-tender, no masses, no hepatomegaly present.  Lymphatic:  No lymphadenopathy present, no supraclavicular nodes.  Musculoskeletal:  Normal gait and station, normal digits, no digital cyanosis or clubbing.  Mental Status/Psychiatric:  Alert, grossly oriented to person, place, and time.  Appropriate and normal mood.    Imaging  XR AP MOBILE CHEST  Narrative: Tyann JEAN Costen    XR AP MOBILE CHEST performed on 06/17/2024 12:46 PM.    INDICATION:  Please comment on presence or abscence of pneumothorax in impression - post bronch   Additional History:  Please comment on presence or abscence of pneumothorax in impression - post  bronch    TECHNIQUE:  Single portable frontal view of the chest.  1 views/1 images submitted for interpretation.    COMPARISON:  Chest x-ray 06/14/2024  ___________________________________  FINDINGS:    Right hilar mass redemonstrated. Median sternotomy noted. Heart size and vascularity are normal. Surgical clips in the chest wall.  Slightly asymmetric opacity at the right lung apex and right lung base, possible pneumonia or atelectasis.    No pneumothorax. Emphysema with blebs at the lung apices. ___________________________________  Impression: 1. No pneumothorax post bronchoscopy.    2. Right hilar mass.    3. Opacity right lung apex and right lung base, may be aspiration or atelectasis.    Radiologist location ID: [id number]      Assessment    ICD-10-CM    1. 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 Simple chronic bronchitis (CMS HCC)  J41.0       3. Cigarette nicotine dependence, uncomplicated  F17.210       4. SOB (shortness of breath)  R06.02       5. Paroxysmal atrial fibrillation (CMS HCC)  I48.0       6. Coronary artery disease, unspecified vessel or lesion type, unspecified whether angina present, unspecified whether native or transplanted heart  I25.10  Plan  Continue medications as prescribed/directed unless changed by provider.  Plan of care discussed with patient.    [name] path he had been reviewed by Oncology and chemo we will be started next week.  They have also ordered PET.  We will continue albuterol  PRN.  We briefly discussed starting ics/Laba but there is some concern about thrush and currently she feels symptoms are controlled.  We will hold off for the time being.  I have encouraged tobacco cessation.  I have encouraged nutrition.  We will refill a short course of Percocet.  I have also referred her to palliative care.  We will follow up in 14 weeks.  To call with any issues prior to follow-up.    Return in about 14 weeks (around  10/04/2024).      The patient was given the opportunity to ask questions and those questions were answered to the patient's satisfaction. The patient was encouraged to call with any additional questions or concerns. Discussed with the patient effects and side effects of medications. Medication safety was discussed.  The patient was informed to contact the office within 7 business days if a message/lab results/referral/imaging results have not been conveyed to the patient.    Electronically signed by Comer FORBES Friend, PA-C      This note may have been partially generated using MModal Fluency Direct system, and there may be some incorrect words, spellings, and punctuation that were not noted in checking the note before saving.         [1]   Social History  Tobacco Use   Smoking Status Every Day    Current packs/day: 0.50    Types: Cigarettes   Smokeless Tobacco Never

## 2024-07-01 ENCOUNTER — Other Ambulatory Visit: Payer: Self-pay

## 2024-07-01 ENCOUNTER — Encounter (HOSPITAL_BASED_OUTPATIENT_CLINIC_OR_DEPARTMENT_OTHER): Payer: Self-pay | Admitting: HEMATOLOGY-ONCOLOGY

## 2024-07-01 ENCOUNTER — Telehealth (HOSPITAL_BASED_OUTPATIENT_CLINIC_OR_DEPARTMENT_OTHER): Payer: Self-pay | Admitting: HEMATOLOGY-ONCOLOGY

## 2024-07-01 ENCOUNTER — Other Ambulatory Visit (HOSPITAL_BASED_OUTPATIENT_CLINIC_OR_DEPARTMENT_OTHER): Payer: Self-pay | Admitting: HEMATOLOGY-ONCOLOGY

## 2024-07-01 MED ORDER — ONDANSETRON 8 MG DISINTEGRATING TABLET
8.0000 mg | ORAL_TABLET | Freq: Three times a day (TID) | ORAL | 3 refills | Status: DC | PRN
Start: 2024-07-01 — End: 2024-07-08
  Filled 2024-07-01: qty 30, 10d supply, fill #0

## 2024-07-01 MED ORDER — OLANZAPINE 5 MG TABLET
5.0000 mg | ORAL_TABLET | Freq: Every evening | ORAL | 0 refills | Status: AC
Start: 2024-07-01 — End: 2024-07-05
  Filled 2024-07-01: qty 3, 3d supply, fill #0

## 2024-07-01 MED ORDER — PROCHLORPERAZINE MALEATE 10 MG TABLET
10.0000 mg | ORAL_TABLET | Freq: Three times a day (TID) | ORAL | 1 refills | Status: AC | PRN
Start: 2024-07-01 — End: 2024-07-31
  Filled 2024-07-01: qty 30, 10d supply, fill #0

## 2024-07-01 NOTE — Telephone Encounter (Signed)
 Referral faxed

## 2024-07-01 NOTE — Telephone Encounter (Signed)
 Daughter called asking about the chemo. Looks like it may have been authed. Wants to know when she will be scheduled.    Please call [name] [phone]. Don't call Kammy, daughter siad she is hard of hearing and has been confused and may not remember the call.

## 2024-07-02 ENCOUNTER — Other Ambulatory Visit: Payer: Self-pay

## 2024-07-02 ENCOUNTER — Encounter (HOSPITAL_BASED_OUTPATIENT_CLINIC_OR_DEPARTMENT_OTHER): Payer: Self-pay | Admitting: HEMATOLOGY-ONCOLOGY

## 2024-07-03 ENCOUNTER — Telehealth (INDEPENDENT_AMBULATORY_CARE_PROVIDER_SITE_OTHER): Payer: Self-pay | Admitting: NURSE PRACTITIONER

## 2024-07-03 ENCOUNTER — Other Ambulatory Visit: Payer: Self-pay

## 2024-07-03 ENCOUNTER — Encounter (INDEPENDENT_AMBULATORY_CARE_PROVIDER_SITE_OTHER): Payer: Self-pay | Admitting: NURSE PRACTITIONER

## 2024-07-03 DIAGNOSIS — J410 Simple chronic bronchitis: Secondary | ICD-10-CM

## 2024-07-03 DIAGNOSIS — C3431 Malignant neoplasm of lower lobe, right bronchus or lung: Secondary | ICD-10-CM

## 2024-07-03 MED ORDER — FLUTICASONE 250 MCG-SALMETEROL 50 MCG/DOSE BLISTR POWDR FOR INHALATION
1.0000 | DISK | Freq: Two times a day (BID) | RESPIRATORY_TRACT | 11 refills | Status: AC
Start: 2024-07-03 — End: ?
  Filled 2024-07-03 – 2024-07-04 (×2): qty 60, 30d supply, fill #0

## 2024-07-03 NOTE — Progress Notes (Signed)
 Spoke with patient's daughter.  Patient would like to start inhaler as discussed at recent office visit.  Will send order for Advair .  Daughter also requests possible oxygen.  Patient declines six minute walk test but agreeable to ONPO.  Will send order to DME for ONPO on room air.

## 2024-07-03 NOTE — Telephone Encounter (Signed)
Order faxed to [facility].

## 2024-07-03 NOTE — Telephone Encounter (Signed)
 Please send order to a DME for onpo on room air (I placed order).  Thanks!

## 2024-07-04 ENCOUNTER — Other Ambulatory Visit (INDEPENDENT_AMBULATORY_CARE_PROVIDER_SITE_OTHER): Payer: Self-pay | Admitting: INTERVENTIONAL CARDIOLOGY

## 2024-07-04 ENCOUNTER — Other Ambulatory Visit (HOSPITAL_BASED_OUTPATIENT_CLINIC_OR_DEPARTMENT_OTHER): Payer: Self-pay | Admitting: HEMATOLOGY-ONCOLOGY

## 2024-07-04 ENCOUNTER — Other Ambulatory Visit: Payer: Self-pay

## 2024-07-04 MED ORDER — PROMETHAZINE 25 MG TABLET
25.0000 mg | ORAL_TABLET | Freq: Three times a day (TID) | ORAL | 0 refills | Status: AC | PRN
Start: 2024-07-04 — End: ?
  Filled 2024-07-04: qty 30, 10d supply, fill #0

## 2024-07-04 NOTE — Telephone Encounter (Signed)
 Refill    Promethazine  25 mg    [facility] pharmacy    Was originally prescribed by ER, needs refill.

## 2024-07-05 ENCOUNTER — Encounter (HOSPITAL_BASED_OUTPATIENT_CLINIC_OR_DEPARTMENT_OTHER): Payer: Self-pay | Admitting: HEMATOLOGY-ONCOLOGY

## 2024-07-08 ENCOUNTER — Emergency Department (HOSPITAL_COMMUNITY)

## 2024-07-08 ENCOUNTER — Inpatient Hospital Stay
Admission: EM | Admit: 2024-07-08 | Discharge: 2024-08-10 | DRG: 417 | Disposition: E | Source: Ambulatory Visit | Attending: INTERNAL MEDICINE | Admitting: INTERNAL MEDICINE

## 2024-07-08 ENCOUNTER — Inpatient Hospital Stay (HOSPITAL_COMMUNITY)

## 2024-07-08 ENCOUNTER — Ambulatory Visit
Admission: RE | Admit: 2024-07-08 | Discharge: 2024-07-08 | Disposition: A | Discharge status: Home / Self Care | Payer: Self-pay | Source: Ambulatory Visit | Attending: HEMATOLOGY-ONCOLOGY | Admitting: HEMATOLOGY-ONCOLOGY

## 2024-07-08 ENCOUNTER — Encounter (INDEPENDENT_AMBULATORY_CARE_PROVIDER_SITE_OTHER): Payer: Self-pay | Admitting: PULMONARY DISEASE

## 2024-07-08 ENCOUNTER — Other Ambulatory Visit: Payer: Self-pay

## 2024-07-08 VITALS — BP 130/75 | HR 92 | Temp 97.2°F | Ht 61.0 in | Wt 137.0 lb

## 2024-07-08 DIAGNOSIS — J1282 Pneumonia due to coronavirus disease 2019: Secondary | ICD-10-CM | POA: Diagnosis present

## 2024-07-08 DIAGNOSIS — K8000 Calculus of gallbladder with acute cholecystitis without obstruction: Principal | ICD-10-CM | POA: Diagnosis present

## 2024-07-08 DIAGNOSIS — Z515 Encounter for palliative care: Secondary | ICD-10-CM

## 2024-07-08 DIAGNOSIS — K828 Other specified diseases of gallbladder: Secondary | ICD-10-CM

## 2024-07-08 DIAGNOSIS — I4729 Other ventricular tachycardia: Secondary | ICD-10-CM | POA: Insufficient documentation

## 2024-07-08 DIAGNOSIS — Z951 Presence of aortocoronary bypass graft: Secondary | ICD-10-CM

## 2024-07-08 DIAGNOSIS — R7989 Other specified abnormal findings of blood chemistry: Secondary | ICD-10-CM

## 2024-07-08 DIAGNOSIS — F39 Unspecified mood [affective] disorder: Secondary | ICD-10-CM | POA: Diagnosis present

## 2024-07-08 DIAGNOSIS — A4189 Other specified sepsis: Secondary | ICD-10-CM | POA: Diagnosis not present

## 2024-07-08 DIAGNOSIS — D6959 Other secondary thrombocytopenia: Secondary | ICD-10-CM | POA: Diagnosis present

## 2024-07-08 DIAGNOSIS — K9189 Other postprocedural complications and disorders of digestive system: Secondary | ICD-10-CM | POA: Diagnosis not present

## 2024-07-08 DIAGNOSIS — C787 Secondary malignant neoplasm of liver and intrahepatic bile duct: Secondary | ICD-10-CM | POA: Diagnosis present

## 2024-07-08 DIAGNOSIS — K661 Hemoperitoneum: Secondary | ICD-10-CM

## 2024-07-08 DIAGNOSIS — U071 COVID-19: Secondary | ICD-10-CM

## 2024-07-08 DIAGNOSIS — Z85118 Personal history of other malignant neoplasm of bronchus and lung: Secondary | ICD-10-CM

## 2024-07-08 DIAGNOSIS — R160 Hepatomegaly, not elsewhere classified: Secondary | ICD-10-CM | POA: Diagnosis present

## 2024-07-08 DIAGNOSIS — I959 Hypotension, unspecified: Secondary | ICD-10-CM

## 2024-07-08 DIAGNOSIS — I10 Essential (primary) hypertension: Secondary | ICD-10-CM | POA: Diagnosis present

## 2024-07-08 DIAGNOSIS — I509 Heart failure, unspecified: Secondary | ICD-10-CM

## 2024-07-08 DIAGNOSIS — K5900 Constipation, unspecified: Secondary | ICD-10-CM | POA: Diagnosis present

## 2024-07-08 DIAGNOSIS — E440 Moderate protein-calorie malnutrition: Secondary | ICD-10-CM | POA: Insufficient documentation

## 2024-07-08 DIAGNOSIS — I272 Pulmonary hypertension, unspecified: Secondary | ICD-10-CM | POA: Diagnosis present

## 2024-07-08 DIAGNOSIS — K810 Acute cholecystitis: Secondary | ICD-10-CM | POA: Insufficient documentation

## 2024-07-08 DIAGNOSIS — L7632 Postprocedural hematoma of skin and subcutaneous tissue following other procedure: Secondary | ICD-10-CM | POA: Diagnosis not present

## 2024-07-08 DIAGNOSIS — E782 Mixed hyperlipidemia: Secondary | ICD-10-CM | POA: Diagnosis present

## 2024-07-08 DIAGNOSIS — Z66 Do not resuscitate: Secondary | ICD-10-CM | POA: Diagnosis not present

## 2024-07-08 DIAGNOSIS — I472 Ventricular tachycardia, unspecified: Secondary | ICD-10-CM | POA: Diagnosis not present

## 2024-07-08 DIAGNOSIS — D649 Anemia, unspecified: Principal | ICD-10-CM

## 2024-07-08 DIAGNOSIS — R590 Localized enlarged lymph nodes: Secondary | ICD-10-CM

## 2024-07-08 DIAGNOSIS — I2510 Atherosclerotic heart disease of native coronary artery without angina pectoris: Secondary | ICD-10-CM | POA: Diagnosis present

## 2024-07-08 DIAGNOSIS — I25810 Atherosclerosis of coronary artery bypass graft(s) without angina pectoris: Secondary | ICD-10-CM | POA: Diagnosis present

## 2024-07-08 DIAGNOSIS — E876 Hypokalemia: Secondary | ICD-10-CM | POA: Diagnosis not present

## 2024-07-08 DIAGNOSIS — C779 Secondary and unspecified malignant neoplasm of lymph node, unspecified: Secondary | ICD-10-CM | POA: Diagnosis present

## 2024-07-08 DIAGNOSIS — W19XXXA Unspecified fall, initial encounter: Secondary | ICD-10-CM | POA: Diagnosis not present

## 2024-07-08 DIAGNOSIS — I480 Paroxysmal atrial fibrillation: Secondary | ICD-10-CM | POA: Diagnosis present

## 2024-07-08 DIAGNOSIS — N200 Calculus of kidney: Secondary | ICD-10-CM

## 2024-07-08 DIAGNOSIS — J9690 Respiratory failure, unspecified, unspecified whether with hypoxia or hypercapnia: Secondary | ICD-10-CM | POA: Diagnosis present

## 2024-07-08 DIAGNOSIS — F17210 Nicotine dependence, cigarettes, uncomplicated: Secondary | ICD-10-CM | POA: Diagnosis present

## 2024-07-08 DIAGNOSIS — Z853 Personal history of malignant neoplasm of breast: Secondary | ICD-10-CM | POA: Diagnosis present

## 2024-07-08 DIAGNOSIS — C3431 Malignant neoplasm of lower lobe, right bronchus or lung: Secondary | ICD-10-CM | POA: Insufficient documentation

## 2024-07-08 DIAGNOSIS — R7401 Elevation of levels of liver transaminase levels: Secondary | ICD-10-CM

## 2024-07-08 DIAGNOSIS — Z79899 Other long term (current) drug therapy: Secondary | ICD-10-CM

## 2024-07-08 DIAGNOSIS — R079 Chest pain, unspecified: Secondary | ICD-10-CM

## 2024-07-08 DIAGNOSIS — R188 Other ascites: Secondary | ICD-10-CM | POA: Diagnosis present

## 2024-07-08 DIAGNOSIS — J9601 Acute respiratory failure with hypoxia: Secondary | ICD-10-CM | POA: Diagnosis present

## 2024-07-08 DIAGNOSIS — Z955 Presence of coronary angioplasty implant and graft: Secondary | ICD-10-CM

## 2024-07-08 DIAGNOSIS — I110 Hypertensive heart disease with heart failure: Secondary | ICD-10-CM | POA: Diagnosis present

## 2024-07-08 DIAGNOSIS — N179 Acute kidney failure, unspecified: Secondary | ICD-10-CM | POA: Diagnosis not present

## 2024-07-08 DIAGNOSIS — D62 Acute posthemorrhagic anemia: Secondary | ICD-10-CM | POA: Insufficient documentation

## 2024-07-08 DIAGNOSIS — R6521 Severe sepsis with septic shock: Secondary | ICD-10-CM | POA: Diagnosis not present

## 2024-07-08 DIAGNOSIS — Z7982 Long term (current) use of aspirin: Secondary | ICD-10-CM

## 2024-07-08 DIAGNOSIS — E872 Acidosis, unspecified: Secondary | ICD-10-CM | POA: Diagnosis not present

## 2024-07-08 DIAGNOSIS — K760 Fatty (change of) liver, not elsewhere classified: Secondary | ICD-10-CM

## 2024-07-08 DIAGNOSIS — Z7951 Long term (current) use of inhaled steroids: Secondary | ICD-10-CM

## 2024-07-08 DIAGNOSIS — C3490 Malignant neoplasm of unspecified part of unspecified bronchus or lung: Secondary | ICD-10-CM

## 2024-07-08 DIAGNOSIS — J440 Chronic obstructive pulmonary disease with acute lower respiratory infection: Secondary | ICD-10-CM | POA: Diagnosis present

## 2024-07-08 DIAGNOSIS — R0600 Dyspnea, unspecified: Secondary | ICD-10-CM

## 2024-07-08 DIAGNOSIS — S065XAA Traumatic subdural hemorrhage with loss of consciousness status unknown, initial encounter: Secondary | ICD-10-CM | POA: Insufficient documentation

## 2024-07-08 DIAGNOSIS — K839 Disease of biliary tract, unspecified: Secondary | ICD-10-CM

## 2024-07-08 LAB — CBC WITH DIFF
HCT: 30.5 % — ABNORMAL LOW (ref 34.8–46.0)
HGB: 9.3 g/dL — ABNORMAL LOW (ref 11.5–16.0)
MCH: 27.5 pg (ref 26.0–32.0)
MCHC: 30.5 g/dL — ABNORMAL LOW (ref 31.0–35.5)
MCV: 90.2 fL (ref 78.0–100.0)
PLATELETS: 74 x10ˆ3/uL — ABNORMAL LOW (ref 150–400)
RBC: 3.38 x10ˆ6/uL — ABNORMAL LOW (ref 3.85–5.22)
RDW-CV: 14.6 % (ref 11.5–15.5)
WBC: 7.5 x10ˆ3/uL (ref 3.7–11.0)

## 2024-07-08 LAB — ECG 12 LEAD
Atrial Rate: 80 {beats}/min
Calculated P Axis: 55 degrees
Calculated R Axis: 25 degrees
Calculated T Axis: 60 degrees
PR Interval: 124 ms
QRS Duration: 84 ms
QT Interval: 402 ms
QTC Calculation: 463 ms
Ventricular rate: 80 {beats}/min

## 2024-07-08 LAB — RETICULOCYTE COUNT
IMMATURE RETIC FRACTION: 23.9 % — ABNORMAL HIGH (ref 2.7–15.9)
RETICULOCYTE % AUTOMATED: 1.63 % (ref 0.50–2.20)
RETICULOCYTE HEMOGLOBIN EQUIVALENT: 27.9 pg — ABNORMAL LOW (ref 28.0–38.0)
RETICULOCYTES COUNT # AUTOMATED: 55.1 x10ˆ3/uL (ref 17.0–100.0)

## 2024-07-08 LAB — EXTENDED RESPIRATORY VIRUS PANEL
ADENOVIRUS ARRAY: NOT DETECTED
BORDETELLA PARAPERTUSSIS (IS 1001): NOT DETECTED
BORDETELLA PERTUSSIS ARRAY: NOT DETECTED
CHLAMYDOPHILA PNEUMONIAE ARRAY: NOT DETECTED
CORONAVIRUS 229E: NOT DETECTED
CORONAVIRUS HKU1: NOT DETECTED
CORONAVIRUS NL63: NOT DETECTED
CORONAVIRUS OC43: NOT DETECTED
INFLUENZA A: NOT DETECTED
INFLUENZA B ARRAY: NOT DETECTED
METAPNEUMOVIRUS ARRAY: NOT DETECTED
MYCOPLASMA PNEUMONIAE ARRAY: NOT DETECTED
PARAINFLUENZA 1 ARRAY: NOT DETECTED
PARAINFLUENZA 2 ARRAY: NOT DETECTED
PARAINFLUENZA 3 ARRAY: NOT DETECTED
PARAINFLUENZA 4 ARRAY: NOT DETECTED
RHINOVIRUS/ENTEROVIRUS ARRAY: NOT DETECTED
RSV ARRAY: NOT DETECTED
SARS CORONAVIRUS 2 (SARS-CoV-2): DETECTED — AB

## 2024-07-08 LAB — COMPREHENSIVE METABOLIC PANEL, NON-FASTING
ALBUMIN: 3.5 g/dL (ref 3.5–5.2)
ALKALINE PHOSPHATASE: 342 U/L — ABNORMAL HIGH (ref 35–129)
ALT (SGPT): 135 U/L — ABNORMAL HIGH (ref 0–33)
ANION GAP: 17 mmol/L (ref 7–18)
AST (SGOT): 293 U/L — ABNORMAL HIGH (ref 0–32)
BILIRUBIN TOTAL: 0.5 mg/dL (ref 0.2–1.2)
BUN: 30 mg/dL — ABNORMAL HIGH (ref 8–23)
CALCIUM: 9.6 mg/dL (ref 8.3–10.7)
CHLORIDE: 100 mmol/L (ref 96–106)
CO2 TOTAL: 22 mmol/L (ref 22–30)
CREATININE: 0.81 mg/dL (ref 0.50–0.90)
ESTIMATED GFR: 80 mL/min/1.73mˆ2 — ABNORMAL LOW (ref >90)
GLUCOSE: 212 mg/dL — ABNORMAL HIGH (ref 74–109)
POTASSIUM: 3.8 mmol/L (ref 3.2–5.0)
PROTEIN TOTAL: 6.6 g/dL (ref 6.4–8.3)
SODIUM: 139 mmol/L (ref 133–144)

## 2024-07-08 LAB — FERRITIN: FERRITIN: >2000 ng/mL — ABNORMAL HIGH (ref 13–150)

## 2024-07-08 LAB — MANUAL DIFF AND MORPHOLOGY-SYSMEX
BASOPHIL #: <0.1 x10ˆ3/uL (ref <=0.20)
BASOPHIL %: 0 %
EOSINOPHIL #: <0.1 x10ˆ3/uL (ref <=0.50)
EOSINOPHIL %: 0 %
LYMPHOCYTE #: 1.35 x10ˆ3/uL (ref 1.00–4.80)
LYMPHOCYTE %: 17 %
MONOCYTE #: 0.3 x10ˆ3/uL (ref 0.20–1.10)
MONOCYTE %: 4 %
NEUTROPHIL #: 5.85 x10ˆ3/uL (ref 1.50–7.70)
NEUTROPHIL %: 75 %
NEUTROPHIL BANDS %: 3 %
NRBC FROM MANUAL DIFF: 7 /100{WBCs} — ABNORMAL HIGH (ref <=1)
REACTIVE LYMPHOCYTE %: 1 %

## 2024-07-08 LAB — VITAMIN B12: VITAMIN B 12: 1106 pg/mL (ref 232–1245)

## 2024-07-08 LAB — TROPONIN-T
TROPONIN-T: 23 ng/L — ABNORMAL HIGH (ref <=14)
TROPONIN-T: 21 ng/L — ABNORMAL HIGH (ref <=14)

## 2024-07-08 LAB — NT-PROBNP: NT-PROBNP: 1476 pg/mL — ABNORMAL HIGH (ref 0–125)

## 2024-07-08 LAB — LDH: LDH: >2500 U/L — ABNORMAL HIGH (ref 135–214)

## 2024-07-08 LAB — IRON TRANSFERRIN AND TIBC
IRON (TRANSFERRIN) SATURATION: 47 % (ref 20–55)
IRON: 146 ug/dL — ABNORMAL HIGH (ref 37–145)
TOTAL IRON BINDING CAPACITY: 309 ug/dL (ref 112–347)
TRANSFERRIN: 221 mg/dL (ref 200–360)

## 2024-07-08 LAB — FOLATE: FOLATE: 3.5 ng/mL — ABNORMAL LOW (ref 4.6–34.8)

## 2024-07-08 LAB — C-REACTIVE PROTEIN (CRP): C-REACTIVE PROTEIN (CRP): 3.5 mg/dL — ABNORMAL HIGH (ref 0.0–0.9)

## 2024-07-08 LAB — SEDIMENTATION RATE: ERYTHROCYTE SEDIMENTATION RATE (ESR): 109 mm/h — ABNORMAL HIGH (ref 0–20)

## 2024-07-08 MED ORDER — BUDESONIDE 0.5 MG/2 ML SUSPENSION FOR NEBULIZATION
1.0000 mg | INHALATION_SUSPENSION | Freq: Two times a day (BID) | RESPIRATORY_TRACT | Status: DC
Start: 2024-07-08 — End: 2024-07-19
  Administered 2024-07-08 – 2024-07-09 (×2): 1 mg via RESPIRATORY_TRACT
  Administered 2024-07-09 – 2024-07-10 (×2): 0 mg via RESPIRATORY_TRACT
  Administered 2024-07-10 – 2024-07-11 (×2): 1 mg via RESPIRATORY_TRACT
  Administered 2024-07-11: 0 mg via RESPIRATORY_TRACT
  Administered 2024-07-12 – 2024-07-13 (×3): 1 mg via RESPIRATORY_TRACT
  Administered 2024-07-13 – 2024-07-14 (×3): 0 mg via RESPIRATORY_TRACT
  Administered 2024-07-15: 1 mg via RESPIRATORY_TRACT
  Administered 2024-07-15: 0 mg via RESPIRATORY_TRACT
  Administered 2024-07-16 – 2024-07-19 (×7): 1 mg via RESPIRATORY_TRACT

## 2024-07-08 MED ORDER — DIPHENHYDRAMINE 25 MG CAPSULE
25.0000 mg | ORAL_CAPSULE | Freq: Every evening | ORAL | Status: DC | PRN
Start: 2024-07-08 — End: 2024-07-17
  Administered 2024-07-09: 25 mg via ORAL
  Filled 2024-07-08: qty 1

## 2024-07-08 MED ORDER — POLYETHYLENE GLYCOL 3350 17 GRAM ORAL POWDER PACKET
17.0000 g | Freq: Every day | ORAL | Status: DC
Start: 2024-07-08 — End: 2024-07-19
  Administered 2024-07-08: 0 g via ORAL
  Administered 2024-07-09 – 2024-07-10 (×2): 17 g via ORAL
  Administered 2024-07-11: 0 g via ORAL
  Administered 2024-07-12 – 2024-07-14 (×3): 17 g via ORAL
  Administered 2024-07-15: 0 g via ORAL
  Administered 2024-07-16 – 2024-07-19 (×4): 17 g via ORAL
  Filled 2024-07-08 (×9): qty 1

## 2024-07-08 MED ORDER — METOPROLOL TARTRATE 50 MG TABLET
25.0000 mg | ORAL_TABLET | Freq: Two times a day (BID) | ORAL | Status: DC
Start: 2024-07-08 — End: 2024-07-19
  Administered 2024-07-08 – 2024-07-11 (×7): 25 mg via ORAL
  Administered 2024-07-12: 0 mg via ORAL
  Administered 2024-07-12 – 2024-07-14 (×5): 25 mg via ORAL
  Administered 2024-07-15: 0 mg via ORAL
  Administered 2024-07-15 – 2024-07-16 (×3): 25 mg via ORAL
  Filled 2024-07-08 (×2): qty 1
  Filled 2024-07-08 (×2): qty 0.5
  Filled 2024-07-08 (×5): qty 1
  Filled 2024-07-08: qty 0.5
  Filled 2024-07-08: qty 1
  Filled 2024-07-08: qty 0.5
  Filled 2024-07-08 (×5): qty 1
  Filled 2024-07-08: qty 0.5

## 2024-07-08 MED ORDER — ARFORMOTEROL 15 MCG/2 ML SOLUTION FOR NEBULIZATION
15.0000 ug | INHALATION_SOLUTION | Freq: Two times a day (BID) | RESPIRATORY_TRACT | Status: DC
Start: 2024-07-08 — End: 2024-07-19
  Administered 2024-07-08: 15 ug via RESPIRATORY_TRACT
  Administered 2024-07-09: 0 ug via RESPIRATORY_TRACT
  Administered 2024-07-09: 15 ug via RESPIRATORY_TRACT
  Administered 2024-07-10: 0 ug via RESPIRATORY_TRACT
  Administered 2024-07-10: 15 ug via RESPIRATORY_TRACT
  Administered 2024-07-11: 0 ug via RESPIRATORY_TRACT
  Administered 2024-07-11 – 2024-07-12 (×3): 15 ug via RESPIRATORY_TRACT
  Administered 2024-07-13: 0 ug via RESPIRATORY_TRACT
  Administered 2024-07-13: 15 ug via RESPIRATORY_TRACT
  Administered 2024-07-14 – 2024-07-15 (×3): 0 ug via RESPIRATORY_TRACT
  Administered 2024-07-15 – 2024-07-19 (×8): 15 ug via RESPIRATORY_TRACT

## 2024-07-08 MED ORDER — LOSARTAN 25 MG TABLET
25.0000 mg | ORAL_TABLET | Freq: Every day | ORAL | Status: DC
Start: 2024-07-08 — End: 2024-07-19
  Administered 2024-07-08: 0 mg via ORAL
  Administered 2024-07-09 – 2024-07-14 (×6): 25 mg via ORAL
  Administered 2024-07-15 – 2024-07-16 (×2): 0 mg via ORAL
  Filled 2024-07-08 (×7): qty 1

## 2024-07-08 MED ORDER — ONDANSETRON HCL (PF) 4 MG/2 ML INJECTION SOLUTION
4.0000 mg | Freq: Four times a day (QID) | INTRAMUSCULAR | Status: DC | PRN
Start: 2024-07-08 — End: 2024-07-17

## 2024-07-08 MED ORDER — GABAPENTIN 400 MG CAPSULE
400.0000 mg | ORAL_CAPSULE | Freq: Three times a day (TID) | ORAL | Status: DC
Start: 2024-07-08 — End: 2024-07-16
  Administered 2024-07-08 – 2024-07-10 (×8): 400 mg via ORAL
  Administered 2024-07-11: 0 mg via ORAL
  Administered 2024-07-11 – 2024-07-16 (×15): 400 mg via ORAL
  Filled 2024-07-08 (×23): qty 1

## 2024-07-08 MED ORDER — ONDANSETRON HCL (PF) 4 MG/2 ML INJECTION SOLUTION
4.0000 mg | INTRAMUSCULAR | Status: AC
Start: 2024-07-08 — End: 2024-07-08
  Administered 2024-07-08: 4 mg via INTRAVENOUS
  Filled 2024-07-08: qty 2

## 2024-07-08 MED ORDER — SODIUM CHLORIDE 0.9% FLUSH BAG - 250 ML
INTRAVENOUS | Status: DC | PRN
Start: 2024-07-08 — End: 2024-07-19

## 2024-07-08 MED ORDER — ALUMINUM-MAG HYDROXIDE-SIMETHICONE 200 MG-200 MG-20 MG/5 ML ORAL SUSP
30.0000 mL | ORAL | Status: DC | PRN
Start: 2024-07-08 — End: 2024-07-19

## 2024-07-08 MED ORDER — FLUTICASONE PROPIONATE 50 MCG/ACTUATION NASAL SPRAY,SUSPENSION
1.0000 | Freq: Every day | NASAL | Status: DC | PRN
Start: 2024-07-08 — End: 2024-07-17

## 2024-07-08 MED ORDER — SODIUM CHLORIDE 0.9 % INTRAVENOUS SOLUTION
2.0000 g | INTRAVENOUS | Status: DC
Start: 2024-07-08 — End: 2024-07-12
  Administered 2024-07-08: 2 g via INTRAVENOUS
  Administered 2024-07-08: 0 g via INTRAVENOUS
  Administered 2024-07-09: 2 g via INTRAVENOUS
  Administered 2024-07-09: 0 g via INTRAVENOUS
  Administered 2024-07-10: 2 g via INTRAVENOUS
  Administered 2024-07-10: 0 g via INTRAVENOUS
  Administered 2024-07-11: 2 g via INTRAVENOUS
  Administered 2024-07-11: 0 g via INTRAVENOUS
  Filled 2024-07-08 (×4): qty 20

## 2024-07-08 MED ORDER — PROMETHAZINE 25 MG TABLET
25.0000 mg | ORAL_TABLET | Freq: Three times a day (TID) | ORAL | Status: DC | PRN
Start: 2024-07-08 — End: 2024-07-08

## 2024-07-08 MED ORDER — OMEGA-3 ACID ETHYL ESTERS 1 GRAM CAPSULE
1.0000 g | ORAL_CAPSULE | Freq: Every day | ORAL | Status: DC
Start: 2024-07-08 — End: 2024-07-19
  Administered 2024-07-08 – 2024-07-10 (×3): 1 g via ORAL
  Administered 2024-07-11: 0 g via ORAL
  Administered 2024-07-12 – 2024-07-19 (×8): 1 g via ORAL
  Filled 2024-07-08 (×12): qty 1

## 2024-07-08 MED ORDER — FENTANYL (PF) 50 MCG/ML INJECTION SOLUTION
50.0000 ug | INTRAMUSCULAR | Status: AC
Start: 2024-07-08 — End: 2024-07-08
  Administered 2024-07-08: 50 ug via INTRAVENOUS
  Filled 2024-07-08: qty 2

## 2024-07-08 MED ORDER — IOPAMIDOL 370 MG IODINE/ML (76 %) INTRAVENOUS SOLUTION
75.0000 mL | INTRAVENOUS | Status: AC
Start: 2024-07-08 — End: 2024-07-08
  Administered 2024-07-08: 75 mL via INTRAVENOUS

## 2024-07-08 MED ORDER — ACETAMINOPHEN 325 MG TABLET
650.0000 mg | ORAL_TABLET | ORAL | Status: DC | PRN
Start: 2024-07-08 — End: 2024-07-12
  Administered 2024-07-09: 650 mg via ORAL
  Filled 2024-07-08: qty 2

## 2024-07-08 MED ORDER — ANASTROZOLE 1 MG TABLET
1.0000 mg | ORAL_TABLET | Freq: Every day | ORAL | Status: DC
Start: 2024-07-08 — End: 2024-07-19
  Administered 2024-07-08: 0 mg via ORAL
  Administered 2024-07-09 – 2024-07-19 (×11): 1 mg via ORAL
  Filled 2024-07-08 (×12): qty 1

## 2024-07-08 MED ORDER — ENOXAPARIN 40 MG/0.4 ML SUBCUTANEOUS SYRINGE
40.0000 mg | INJECTION | SUBCUTANEOUS | Status: DC
Start: 2024-07-08 — End: 2024-07-17
  Administered 2024-07-08 – 2024-07-10 (×3): 40 mg via SUBCUTANEOUS
  Filled 2024-07-08 (×3): qty 0.4

## 2024-07-08 MED ORDER — AZITHROMYCIN 250 MG TABLET
250.0000 mg | ORAL_TABLET | Freq: Every day | ORAL | Status: AC
Start: 2024-07-09 — End: 2024-07-11
  Administered 2024-07-09 – 2024-07-10 (×2): 250 mg via ORAL
  Filled 2024-07-08 (×2): qty 1

## 2024-07-08 MED ORDER — CLONAZEPAM 0.5 MG TABLET
0.5000 mg | ORAL_TABLET | Freq: Two times a day (BID) | ORAL | Status: DC | PRN
Start: 2024-07-08 — End: 2024-07-19
  Administered 2024-07-09 (×2): 0.5 mg via ORAL
  Filled 2024-07-08 (×2): qty 1

## 2024-07-08 MED ORDER — SODIUM CHLORIDE 0.9 % (FLUSH) INJECTION SYRINGE
3.0000 mL | INJECTION | INTRAMUSCULAR | Status: DC | PRN
Start: 2024-07-08 — End: 2024-07-19

## 2024-07-08 MED ORDER — OLANZAPINE 2.5 MG TABLET
5.0000 mg | ORAL_TABLET | Freq: Every evening | ORAL | Status: DC
Start: 2024-07-08 — End: 2024-07-19
  Administered 2024-07-08 – 2024-07-16 (×9): 5 mg via ORAL
  Filled 2024-07-08 (×13): qty 2

## 2024-07-08 MED ORDER — TRAZODONE 100 MG TABLET
150.0000 mg | ORAL_TABLET | Freq: Every evening | ORAL | Status: DC
Start: 2024-07-08 — End: 2024-07-17
  Administered 2024-07-08 – 2024-07-16 (×9): 150 mg via ORAL
  Filled 2024-07-08 (×2): qty 2
  Filled 2024-07-08 (×2): qty 1.5
  Filled 2024-07-08 (×2): qty 2
  Filled 2024-07-08: qty 1.5
  Filled 2024-07-08 (×3): qty 2

## 2024-07-08 MED ORDER — SODIUM CHLORIDE 0.9 % INTRAVENOUS SOLUTION
500.0000 mg | Freq: Once | INTRAVENOUS | Status: AC
Start: 2024-07-08 — End: 2024-07-08
  Administered 2024-07-08: 0 mg via INTRAVENOUS
  Administered 2024-07-08: 500 mg via INTRAVENOUS
  Filled 2024-07-08: qty 5

## 2024-07-08 MED ORDER — ATORVASTATIN 40 MG TABLET
40.0000 mg | ORAL_TABLET | Freq: Every day | ORAL | Status: DC
Start: 2024-07-08 — End: 2024-07-19
  Administered 2024-07-08: 0 mg via ORAL
  Administered 2024-07-09 – 2024-07-10 (×2): 40 mg via ORAL
  Administered 2024-07-11: 0 mg via ORAL
  Administered 2024-07-12 – 2024-07-16 (×5): 40 mg via ORAL
  Filled 2024-07-08 (×7): qty 1

## 2024-07-08 MED ORDER — DULOXETINE 60 MG CAPSULE,DELAYED RELEASE
60.0000 mg | DELAYED_RELEASE_CAPSULE | Freq: Every day | ORAL | Status: DC
Start: 2024-07-08 — End: 2024-07-19
  Administered 2024-07-08: 0 mg via ORAL
  Administered 2024-07-09 – 2024-07-10 (×2): 60 mg via ORAL
  Administered 2024-07-11: 0 mg via ORAL
  Administered 2024-07-12 – 2024-07-17 (×6): 60 mg via ORAL
  Administered 2024-07-18: 0 mg via ORAL
  Administered 2024-07-19: 60 mg via ORAL
  Filled 2024-07-08 (×9): qty 1

## 2024-07-08 MED ORDER — IOPAMIDOL 370 MG IODINE/ML (76 %) INTRAVENOUS SOLUTION
100.0000 mL | INTRAVENOUS | Status: AC
Start: 2024-07-08 — End: 2024-07-08
  Administered 2024-07-08: 100 mL via INTRAVENOUS

## 2024-07-08 MED ORDER — MAGNESIUM HYDROXIDE 400 MG/5 ML ORAL SUSPENSION
15.0000 mL | Freq: Four times a day (QID) | ORAL | Status: DC | PRN
Start: 2024-07-08 — End: 2024-07-17
  Administered 2024-07-09: 1200 mg via ORAL
  Filled 2024-07-08 (×6): qty 15

## 2024-07-08 MED ORDER — ASPIRIN 81 MG CHEWABLE TABLET
81.0000 mg | CHEWABLE_TABLET | Freq: Every day | ORAL | Status: DC
Start: 2024-07-08 — End: 2024-07-12
  Administered 2024-07-08: 0 mg via ORAL
  Administered 2024-07-09 – 2024-07-10 (×2): 81 mg via ORAL
  Administered 2024-07-11: 0 mg via ORAL
  Administered 2024-07-12: 81 mg via ORAL
  Filled 2024-07-08 (×3): qty 1

## 2024-07-08 MED ORDER — MORPHINE 4 MG/ML INJECTION WRAPPER
4.0000 mg | INJECTION | INTRAMUSCULAR | Status: AC
Start: 2024-07-08 — End: 2024-07-08
  Administered 2024-07-08: 4 mg via INTRAVENOUS
  Filled 2024-07-08: qty 1

## 2024-07-08 MED ORDER — PROCHLORPERAZINE MALEATE 10 MG TABLET
10.0000 mg | ORAL_TABLET | Freq: Three times a day (TID) | ORAL | Status: DC | PRN
Start: 2024-07-08 — End: 2024-07-17
  Administered 2024-07-09 – 2024-07-12 (×2): 10 mg via ORAL
  Filled 2024-07-08 (×7): qty 1

## 2024-07-08 MED ORDER — SODIUM CHLORIDE 0.9 % (FLUSH) INJECTION SYRINGE
3.0000 mL | INJECTION | Freq: Three times a day (TID) | INTRAMUSCULAR | Status: DC
Start: 2024-07-08 — End: 2024-07-19
  Administered 2024-07-08 – 2024-07-09 (×5): 3 mL
  Administered 2024-07-10 (×2): 0 mL
  Administered 2024-07-10: 3 mL
  Administered 2024-07-11 (×2): 0 mL
  Administered 2024-07-11 – 2024-07-12 (×2): 3 mL
  Administered 2024-07-12 (×2): 0 mL
  Administered 2024-07-13: 3 mL
  Administered 2024-07-13 (×2): 0 mL
  Administered 2024-07-14 – 2024-07-15 (×4): 3 mL
  Administered 2024-07-16 (×2): 0 mL
  Administered 2024-07-16 – 2024-07-19 (×8): 3 mL

## 2024-07-08 MED ORDER — DEXTROSE 5% IN WATER (D5W) FLUSH BAG - 250 ML
INTRAVENOUS | Status: DC | PRN
Start: 2024-07-08 — End: 2024-07-19

## 2024-07-08 MED ORDER — PANTOPRAZOLE 40 MG TABLET,DELAYED RELEASE
40.0000 mg | DELAYED_RELEASE_TABLET | Freq: Every day | ORAL | Status: DC
Start: 2024-07-08 — End: 2024-07-12
  Administered 2024-07-08 – 2024-07-12 (×5): 40 mg via ORAL
  Filled 2024-07-08 (×5): qty 1

## 2024-07-08 MED ORDER — MULTIVIT-MINS-FERROUS GLUCONATE 9 MG IRON/15 ML (15 ML) ORAL LIQUID
15.0000 mL | Freq: Every day | ORAL | Status: DC
Start: 2024-07-08 — End: 2024-07-19
  Administered 2024-07-08 – 2024-07-10 (×3): 15 mL via ORAL
  Administered 2024-07-11: 0 mL via ORAL
  Administered 2024-07-12 – 2024-07-19 (×8): 15 mL via ORAL
  Filled 2024-07-08 (×12): qty 15

## 2024-07-08 MED ORDER — CHOLECALCIFEROL (VITAMIN D3) 25 MCG (1,000 UNIT) TABLET
1000.0000 [IU] | ORAL_TABLET | Freq: Every day | ORAL | Status: DC
Start: 2024-07-08 — End: 2024-07-19
  Administered 2024-07-08: 0 [IU] via ORAL
  Administered 2024-07-09 – 2024-07-10 (×2): 1000 [IU] via ORAL
  Administered 2024-07-11: 0 [IU] via ORAL
  Administered 2024-07-12 – 2024-07-19 (×8): 1000 [IU] via ORAL
  Filled 2024-07-08 (×10): qty 1

## 2024-07-08 MED ORDER — IPRATROPIUM 0.5 MG-ALBUTEROL 3 MG (2.5 MG BASE)/3 ML NEBULIZATION SOLN
3.0000 mL | INHALATION_SOLUTION | RESPIRATORY_TRACT | Status: DC | PRN
Start: 2024-07-08 — End: 2024-07-11
  Administered 2024-07-09: 3 mL via RESPIRATORY_TRACT

## 2024-07-08 NOTE — Nurses Notes (Signed)
 Pt here today for Tecentriq/Etoposide/Carbo.  Pt ambulated to room without assistance.  Pt c/o increased SOB, worse with ambulation.  O2 sat on room air 89%.  Pt placed on 2 li O2 NC.  Sat increased to 94% on 2 li NC. Pt also c/o severe pain last night- generalized, but worst pain in legs- states she was up until 4 am.  When questioned pt states she has had a productive cough with yellow sputum.  Dr. Norleen Ponugupati notified.   Order obtained to send pt to ER for further evaluation and plans to admit and do first cycle of chemo inpatient.  Report called to [facility] in ER.  Pt transported to ER via wheelchair with portable O2, accompanied by family.      Laymon Gentry, RN  07/08/2024 10:26

## 2024-07-08 NOTE — ED Nurses Note (Signed)
 Asked Dr. Vaidyanathan if pt needed blood cultures prior to antibiotics, he reported pt did not need blood cultures.

## 2024-07-08 NOTE — Consults (Signed)
 [name]  Hematology/Oncology    Consult Note      Rachel [name], Rachel [name], 68 y.o. [name]  Date of Birth:  [birth year]  Inpatient Admission Date:  07/08/2024  Date of Service: 07/08/2024    Chief Complaint:  Known patient - history of small-cell lung CA with liver metastases    HPI:  This is a 68 year old [name] who presented outpatient infusion center today for Tecentriq/etoposide/carbo treatment when she reported worsening dyspnea to nursing staff.  She was found to be 89% on room air.  Was then placed on 2 L nasal cannula O2 which increased her to 94% oxygenation.  Also c/o worsening generalized pain that is worse in the legs as well as productive cough with yellow sputum.  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    Review of Systems:   Review of Systems   Constitutional:  Positive for fatigue. Negative for activity change, appetite change, chills, diaphoresis, fever and unexpected weight change.   HENT:  Negative for congestion, ear pain, facial swelling, hearing loss, mouth sores, nosebleeds, sinus pain, sore throat, tinnitus, trouble swallowing and voice change.    Eyes:  Negative for photophobia, pain and visual disturbance.   Respiratory:  Positive for cough and shortness of breath. Negative for apnea, choking, chest tightness, wheezing and stridor.    Cardiovascular:  Negative for chest pain, palpitations and leg swelling.   Gastrointestinal:  Negative for abdominal distention, abdominal pain, anal bleeding, blood in stool, constipation, diarrhea, nausea, rectal pain and vomiting.   Endocrine: Negative for cold intolerance and heat intolerance.   Genitourinary:  Negative for decreased urine volume, difficulty urinating, dysuria, flank pain, frequency, hematuria and urgency.   Musculoskeletal:  Positive for arthralgias and myalgias. Negative for back pain, gait problem, joint swelling and neck pain.   Skin:  Negative for  color change, pallor, rash and wound.   Allergic/Immunologic: Negative for immunocompromised state.   Neurological:  Negative for dizziness, syncope, facial asymmetry, speech difficulty, weakness, light-headedness, numbness and headaches.   Hematological:  Negative for adenopathy. Does not bruise/bleed easily.   Psychiatric/Behavioral:  Negative for agitation, behavioral problems and confusion.      Past Medical History:   Diagnosis Date    Abdominal pain     Cancer (CMS HCC)     Chest pain     Chronic lung disease     Coronary artery disease     Dyslipidemia     Essential hypertension     Heart disease     History of percutaneous coronary intervention     History of stress test     Mixed hyperlipidemia     Unspecified chronic bronchitis      Past Surgical History:   Procedure Laterality Date    CARDIAC CATHETERIZATION      CORONARY ANGIOPLASTY      CORONARY ARTERY STENT PLACEMENT      HX BACK SURGERY      HX CORONARY ARTERY BYPASS GRAFT      HX MASTECTOMY, SIMPLE Bilateral     HX TUBAL LIGATION      NECK SURGERY       Social History     Socioeconomic History    Marital status: Widowed     Spouse name: Not on file    Number of children: Not on file    Years of education: Not on file    Highest education level: Not  on file   Occupational History    Not on file   Tobacco Use    Smoking status: Every Day     Current packs/day: 0.50     Average packs/day: 0.5 packs/day for 52.7 years (26.4 ttl pk-yrs)     Types: Cigarettes     Start date: 1973    Smokeless tobacco: Never   Vaping Use    Vaping status: Never Used   Substance and Sexual Activity    Alcohol use: Yes     Comment: occasional    Drug use: Yes     Types: Marijuana    Sexual activity: Not on file   Other Topics Concern    Not on file   Social History Narrative    Not on file     Social Determinants of Health     Financial Resource Strain: Not on file   Transportation Needs: Not on file   Social Connections: Low Risk (06/14/2024)    Social Connections     SDOH  Social Isolation: 5 or more times a week   Intimate Partner Violence: Not on file   Housing Stability: Not on file      Family Medical History:       Problem Relation (Age of Onset)    Arthritis Mother, Maternal Grandmother    Cancer Father, Other    Heart Attack Father, Other    Heart Disease Brother, Other    Hypertension (High Blood Pressure) Mother, Father, Other    Pacemaker Mother          Vital Signs:  Temp  Avg: Rachel.5 C (97.7 F)  Min: Rachel.2 C (97.2 F)  Max: Rachel.7 C (98.1 F)    Pulse  Avg: 86.9  Min: 80  Max: 101 BP  Min: 118/73  Max: 157/142   Resp  Avg: 17.4  Min: 12  Max: 23 SpO2  Avg: 93.3 %  Min: 89 %  Max: 98 %          Input/Output  No intake or output data in the 24 hours ending 07/08/24 1626 I/O last shift:  No intake/output data recorded.     LABS:   Results for orders placed or performed during the hospital encounter of 07/08/24 (from the past 24 hours)   ECG 12 LEAD   Result Value Ref Range    Ventricular rate 80 BPM    Atrial Rate 80 BPM    PR Interval 124 ms    QRS Duration 84 ms    QT Interval 402 ms    QTC Calculation 463 ms    Calculated P Axis 55 degrees    Calculated R Axis 25 degrees    Calculated T Axis 60 degrees   BNP   Result Value Ref Range    NT-PROBNP 1,476 (H) 0 - 125 pg/mL   COMPREHENSIVE METABOLIC PANEL, NON-FASTING   Result Value Ref Range    SODIUM 139 133 - 144 mmol/L    POTASSIUM 3.8 3.2 - 5.0 mmol/L    CHLORIDE 100 96 - 106 mmol/L    CO2 TOTAL 22 22 - 30 mmol/L    ANION GAP 17 7 - 18 mmol/L    BUN 30 (H) 8 - 23 mg/dL    CREATININE 9.18 9.49 - 0.90 mg/dL    ESTIMATED GFR 80 (L) >90 mL/min/1.20m^2    ALBUMIN 3.5 3.5 - 5.2 g/dL    CALCIUM 9.6 8.3 - 89.2 mg/dL    GLUCOSE 787 (H) 74 -  109 mg/dL    ALKALINE PHOSPHATASE 342 (H) 35 - 129 U/L    ALT (SGPT) 135 (H) 0 - 33 U/L    AST (SGOT) 293 (H) 0 - 32 U/L    BILIRUBIN TOTAL 0.5 0.2 - 1.2 mg/dL    PROTEIN TOTAL 6.6 6.4 - 8.3 g/dL   TROPONIN-T NOW   Result Value Ref Range    TROPONIN-T 23 (H) <=14 ng/L   CBC WITH DIFF   Result  Value Ref Range    WBC 7.5 3.7 - 11.0 x10^3/uL    RBC 3.38 (L) 3.85 - 5.22 x10^6/uL    HGB 9.3 (L) 11.5 - 16.0 g/dL    HCT 69.4 (L) 65.1 - 46.0 %    MCV 90.2 78.0 - 100.0 fL    MCH 27.5 26.0 - 32.0 pg    MCHC 30.5 (L) 31.0 - 35.5 g/dL    RDW-CV 85.3 88.4 - 84.4 %    PLATELETS 74 (L) 150 - 400 x10^3/uL   MANUAL DIFF AND MORPHOLOGY-SYSMEX   Result Value Ref Range    NEUTROPHIL % 75 %    LYMPHOCYTE %  17 %    MONOCYTE % 4 %    EOSINOPHIL % 0 %    BASOPHIL % 0 %    NEUTROPHIL BANDS % 3   %    REACTIVE LYMPHOCYTE % 1 %    NEUTROPHIL # 5.85 1.50 - 7.70 x10^3/uL    LYMPHOCYTE # 1.35 1.00 - 4.80 x10^3/uL    MONOCYTE # 0.30 0.20 - 1.10 x10^3/uL    EOSINOPHIL # <0.10 <=0.50 x10^3/uL    BASOPHIL # <0.10 <=0.20 x10^3/uL    NRBC FROM MANUAL DIFF 7 (H) <=1 per 100 WBC   TROPONIN-T IN TWO HOURS   Result Value Ref Range    TROPONIN-T 21 (H) <=14 ng/L   EXTENDED RESPIRATORY VIRUS PANEL    Specimen: Nasopharyngeal Swab   Result Value Ref Range    ADENOVIRUS ARRAY Not Detected Not Detected    CORONAVIRUS 229E Not Detected Not Detected    CORONAVIRUS HKU1 Not Detected Not Detected    CORONAVIRUS NL63 Not Detected Not Detected    CORONAVIRUS OC43 Not Detected Not Detected    SARS CORONAVIRUS 2 (SARS-CoV-2) Detected (A) Not Detected    METAPNEUMOVIRUS ARRAY Not Detected Not Detected    RHINOVIRUS/ENTEROVIRUS ARRAY Not Detected Not Detected    INFLUENZA A Not Detected Not Detected    INFLUENZA B ARRAY Not Detected Not Detected    PARAINFLUENZA 1 ARRAY Not Detected Not Detected    PARAINFLUENZA 2 ARRAY Not Detected Not Detected    PARAINFLUENZA 3 ARRAY Not Detected Not Detected    PARAINFLUENZA 4 ARRAY Not Detected Not Detected    RSV ARRAY Not Detected Not Detected    BORDETELLA PARAPERTUSSIS (IS 1001) Not Detected Not Detected    BORDETELLA PERTUSSIS ARRAY Not Detected Not Detected    CHLAMYDOPHILA PNEUMONIAE ARRAY Not Detected Not Detected    MYCOPLASMA PNEUMONIAE ARRAY Not Detected Not Detected     IMAGES:  Results for orders placed or  performed during the hospital encounter of 07/08/24 (from the past 72 hours)   XR AP MOBILE CHEST     Status: None    Narrative    Diala JEAN Sassone    XR AP MOBILE CHEST performed on 07/08/2024 10:48 AM.    INDICATION:  Chest Pain   Additional History:  chest pain    TECHNIQUE:  Single portable frontal view of the  chest.  1 views/1 images submitted for interpretation.    COMPARISON:  Chest x-ray 06/17/2024  ___________________________________  FINDINGS:    Median sternotomy noted. Heart size and vascularity are similar. Right hilar adenopathy. Heart size normal.  Vague groundglass opacities at both lung bases and minimally right upper lobe atelectasis or minimal pneumonitis. No pleural effusion. No pneumothorax. Dextrocurvature thoracic spine.  ___________________________________    Impression    1. Right hilar adenopathy.    2. Vague pulmonary opacities could be mild pneumonia or atelectasis.        Radiologist location ID: [id number]     CT ABDOMEN PELVIS W IV CONTRAST     Status: None    Narrative    Peta JEAN Moates    CT ABDOMEN PELVIS W IV CONTRAST performed on 07/08/2024 1:18 PM.    INDICATION:  abd pain   Additional History:  abdomen pain, nausea, vomiting    TECHNIQUE:  CT abdomen and pelvis with axial, coronal, and sagittal multiplanar reformations, performed with contrast. Dose modulation, automated exposure control, and/or iterative reconstruction were used for dose reduction.    CONTRAST:  100 mL Isovue  370 IV    COMPARISON:  Abdomen CT 06/14/2024  ___________________________________  FINDINGS:    LOWER THORAX: Unremarkable.    HEPATOBILIARY:  Hepatomegaly and hepatic steatosis. Scattered hypodense lesions in the liver redemonstrated not greatly changed and the short-term interval.  Pericholecystic fluid now present, gallbladder slightly contracted.  No intrahepatic or extrahepatic ductal dilation.  PANCREAS: No focal masses or ductal dilatation.  SPLEEN: No splenomegaly.    ADRENALS: No adrenal  nodules.  KIDNEYS/URETERS/BLADDER: No hydronephrosis,. Bilateral renal cyst and multiple bilateral renal calculi redemonstrated.  REPRODUCTIVE: No significant findings.    PERITONEUM / RETROPERITONEUM: New free fluid in the dependent pelvis.  LYMPH NODES: No lymphadenopathy. Small retroperitoneal are unchanged. Lymph nodes  VESSELS: Abdominal aorta is normal in caliber. Severe atherosclerotic disease aorta.    GI TRACT: No distention, wall thickening, or inflammatory stranding.  Appendix is normal. Colon diverticulosis. ABDOMINAL WALL:  Intact.    SOFT TISSUES: Unremarkable.  BONES: Degenerative changes are present in the spine  ___________________________________    Impression    1. Hepatomegaly, hepatic steatosis and hepatic lesions, not greatly changed and new pericholecystic fluid now present.  2. Multiple nonobstructing bilateral renal calculi.  3. New free fluid in the pelvis.      Radiologist location ID: [id number]       Physical Exam:  Physical Exam  Vitals reviewed.   Constitutional:       Appearance: She is normal weight. She is ill-appearing.   HENT:      Head: Normocephalic.      Nose: Nose normal.      Mouth/Throat:      Pharynx: Oropharynx is clear.   Eyes:      Extraocular Movements: Extraocular movements intact.      Conjunctiva/sclera: Conjunctivae normal.   Cardiovascular:      Rate and Rhythm: Normal rate.      Pulses: Normal pulses.   Pulmonary:      Effort: Pulmonary effort is normal.   Abdominal:      Palpations: Abdomen is soft.   Musculoskeletal:         General: Normal range of motion.      Cervical back: Normal range of motion.   Skin:     General: Skin is dry.   Neurological:      General: No focal deficit present.  Mental Status: She is alert and oriented to person, place, and time. Mental status is at baseline.   Psychiatric:         Mood and Affect: Mood normal.         Behavior: Behavior normal.         Thought Content: Thought content normal.         Judgment: Judgment normal.        Problem List:  Active Hospital Problems   (*Primary Problem)    Diagnosis    *Acute hypoxic respiratory failure (CMS HCC)      Assessment/Plan:    68 year old [name] presenting with acute hypoxic respiratory failure:    Acute hypoxic respiratory failure:  CXR revealing vague pulmonary opacities that could be mild PNA or atelectasis.  She is COVID positive.  CTA chest to rule out PE to be completed.  She is on IV antibiotics.    Small-cell lung CA with liver metastasis:  Follows with Medical Oncology on outpatient basis at [facility].  She was to receive carboplatin/etoposide/Tecentriq today, 07/08/2024, when she presented with acute respiratory symptoms that prompted further evaluation by the emergency room as stated in the above HPI.  Withhold current chemotherapy regimen until resolution of acute conditions.    Normocytic hypochromic anemia:  Current H/H = 9.3/30.5.  No acute blood loss noted.  Could be secondary to acute illness or metastatic disease.  Still, obtain anemia panel.  Rule out nutritional deficiencies including B12, folate, and copper.  Obtain iron  panel to rule out iron -deficiency anemia.  Check LDH and reticulocyte count as well as inflammatory markers.  Noted transaminitis as well as elevated alk-phos.  This is likely related to liver mets.  Continue to monitor counts.  Transfuse with PRBC if hemoglobin less than 7 grams/deciliter or greater than 7 and symptomatic.    Thrombocytopenia:  Current counts = 74,000.  No overt bruising or bleeding.  Likely secondary to underlying metastatic disease.  Defer further thrombocytopenia panel at this time.  Continue to monitor counts.  Transfuse with platelets if <50 K and actively bleeding or <10 K w/wo bleeding.    Hypertension: Managed with antihypertensives.    HLD:  On statin.    CAD:  History of CABG x1.  On cardioprotective medication, 81 mg ASA daily.      Further recommendations and/or changes to be determined by my attending physician.

## 2024-07-08 NOTE — Assessment & Plan Note (Signed)
 Acute hypoxic respiratory failure. Consider the possibility of PE, given a patient who is in a hypercoagulable state. We'll get a CT angio chest with PE protocol. We'll also get a respiratory viral panel as family is noting some increased chest congestion and cannot rule out a mild exacerbation of COPD. We'll cover for community-acquired pneumonia with Rocephin and azithromycin. We'll get a CTAP protocol. We'll get a Hemoccult card as well on the possibility that this is truly anemia and monitor hemoglobins. Of note, this could also be worsening progression of the patient's known small cell carcinoma of the lungs. We'll consult the primary oncologist as she follows with Dr. Ludger.

## 2024-07-08 NOTE — H&P (Signed)
 CHIEF COMPLAINT  New hypoxia    HPI:    [name] is a 68 y.o. 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  In ED WBC 7.5, HGB 9.3 (was 11.8 on 06/17/2024), chest x-ray notable for hilar adenopathy and bag pulmonary opacities concerning for possible atelectasis versus mild pneumonia.  Patient with slight transaminitis with AST 293, ALT 135, ALP 342 and CT abdomen pelvis notable for hepatomegaly with hepatic steatosis/hepatic lesions not greatly changed and new pericholecystic fluid present and multiple nonobstructing bilateral renal calculi with new free fluid in the pelvis.  Patient was noted to be saturating in the 80s on room air.  Requiring 2 L NC to get above 90%.  ED requested evaluation for admission.    Seen at bedside with family present. Family was providing part of the history. The patient herself is a poor historian. Per both patient and family, the oxygen requirement is new. The patient endorses some intermittent pleuritic-type chest pain with inspiration and general pain for the last 6 days. She reports that she was supposed to go to chemotherapy today. The family stated that the chemotherapy clinic had sent her due to the increased work of breathing. The patient herself endorses some abdominal pain in bilateral lower quadrants, as well as nausea and vomiting.      PAST MEDICAL & SURGICAL HISTORIES:   Past Medical History:   Diagnosis Date    Abdominal pain     Cancer (CMS HCC)     Chest pain     Chronic lung disease     Coronary artery disease     Dyslipidemia     Essential hypertension     Heart disease     History of percutaneous coronary intervention     History of stress test     Mixed hyperlipidemia     Unspecified chronic bronchitis        Past Surgical History:   Procedure Laterality Date    CARDIAC  CATHETERIZATION      CORONARY ANGIOPLASTY      CORONARY ARTERY STENT PLACEMENT      HX BACK SURGERY      HX CORONARY ARTERY BYPASS GRAFT      HX MASTECTOMY, SIMPLE Bilateral     HX TUBAL LIGATION      NECK SURGERY         HOME MEDICATIONS:  Medications Prior to Admission       Prescriptions    anastrozole  (ARIMIDEX ) 1 mg Oral Tablet    Take 1 Tablet (1 mg total) by mouth Daily    aspirin  81 mg Oral Tablet, Chewable    Chew 1 Tablet (81 mg total) Once a day    atorvastatin  (LIPITOR) 40 mg Oral Tablet    Take 1 Tablet (40 mg total) by mouth Daily    cholecalciferol , vitamin D3, 25 mcg (1,000 unit) Oral Tablet    Take 1 Tablet (1,000 Units total) by mouth Daily    clonazePAM  (KLONOPIN ) 0.5 mg Oral Tablet    Take 1 Tablet (0.5 mg total) by mouth Twice per day as needed for Other (ANXIETY)    DULoxetine  (CYMBALTA  DR) 60 mg Oral Capsule, Delayed Release(E.C.)    Take 1 Capsule (60 mg total) by mouth Daily    fluticasone  propion-salmeteroL (ADVAIR ) 250-50 mcg/dose Inhalation oral diskus  inhaler    Take 1 Puff (1 Inhalation total) by inhalation Twice daily    fluticasone  propionate (FLONASE ) 50 mcg/actuation Nasal Spray, Suspension    1 Spray Once per day as needed for Other (CONGESTION)    gabapentin  (NEURONTIN ) 400 mg Oral Capsule    Take 1 Capsule (400 mg total) by mouth Three times a day    losartan  (COZAAR ) 25 mg Oral Tablet    Take 1 Tablet (25 mg total) by mouth Daily    metoprolol  tartrate (LOPRESSOR ) 25 mg Oral Tablet    Take 1 Tablet (25 mg total) by mouth Twice daily    MV,Ca,Ir,Min-FA-Lut-Lyco-HC153 3-133-33 mg-mcg-mcg Oral Capsule    Take 1 Capsule by mouth Daily    OLANZapine  (ZYPREXA ) 5 mg Oral Tablet    Take 1 Tablet (5 mg total) by mouth Every night for 3 days    omega-3 fatty acid (LOVAZA ) 1 gram Oral Capsule    Take 1 Capsule (1 g total) by mouth Daily    ondansetron  (ZOFRAN  ODT) 8 mg Oral Tablet, Rapid Dissolve    Place 1 Tablet (8 mg total) under the tongue Every 8 hours as needed for Nausea/Vomiting Do  not use the first 5 days after treatment    oxyCODONE -acetaminophen  (PERCOCET) 7.5-325 mg Oral Tablet    Take 1 Tablet by mouth Every 4 hours as needed for Pain    pantoprazole  (PROTONIX ) 40 mg Oral Tablet, Delayed Release (E.C.)    Take 1 Tablet (40 mg total) by mouth Daily    prochlorperazine  (COMPAZINE ) 10 mg Oral Tablet    Take 1 Tablet (10 mg total) by mouth Three times a day as needed for Nausea/Vomiting for up to 30 days    promethazine  (PHENERGAN ) 25 mg Oral Tablet    Take 1 Tablet (25 mg total) by mouth Every 8 hours as needed for Nausea/Vomiting    traZODone  (DESYREL ) 150 mg Oral Tablet    Take 1 Tablet (150 mg total) by mouth Every night            ALLERGIES:  Per chart  She has no known allergies.    FAMILY HISTORY:  Per chart  Her family history includes Arthritis in her maternal grandmother and mother; Cancer in her father and another family member; Heart Attack in her father and another family member; Heart Disease in her brother and another family member; Hypertension (High Blood Pressure) in her father, mother, and another family member; Pacemaker in her mother.    SOCIAL HISTORY:  Per chart  She  reports current alcohol use. She  reports that she has been smoking cigarettes. She started smoking about 52 years ago. She has a 26.4 pack-year smoking history. She has never used smokeless tobacco. She  reports current drug use. Drug: Marijuana.      REVIEW OF SYSTEMS:    All pertinent review of systems are listed in HPI.    PHYSICAL EXAM:    BP (!) 157/142   Pulse (!) 101   Temp 36.7 C (98.1 F)   Resp (!) 23   Ht 1.549 m (5' 1)   Wt 62.1 kg (137 lb)   SpO2 94%   BMI 25.89 kg/m        Ventilator Settings:          General: 68 year old, acute and chronically ill-appearing female, in no acute distress.    Respiratory: Diminished breath sounds bilaterally with wheezing. No cyanosis or tachypnea on 2 L and CFM of evaluation.  Cardiovascular: RRR, notable murmur, lymphedema noted without pitting  edema.    GI: Abdomen with some tenderness, more so in bilateral lower quadrants. Slightly firm.    Neurologic: Responds to questions and commands appropriately, but in the context of being a poor historian.          LABS:    Results for orders placed or performed during the hospital encounter of 07/08/24 (from the past 24 hours)   BNP    Collection Time: 07/08/24  9:56 AM   Result Value Ref Range    NT-PROBNP 1,476 (H) 0 - 125 pg/mL   COMPREHENSIVE METABOLIC PANEL, NON-FASTING    Collection Time: 07/08/24  9:56 AM   Result Value Ref Range    SODIUM 139 133 - 144 mmol/L    POTASSIUM 3.8 3.2 - 5.0 mmol/L    CHLORIDE 100 96 - 106 mmol/L    CO2 TOTAL 22 22 - 30 mmol/L    ANION GAP 17 7 - 18 mmol/L    BUN 30 (H) 8 - 23 mg/dL    CREATININE 9.18 9.49 - 0.90 mg/dL    ESTIMATED GFR 80 (L) >90 mL/min/1.23m^2    ALBUMIN 3.5 3.5 - 5.2 g/dL    CALCIUM 9.6 8.3 - 89.2 mg/dL    GLUCOSE 787 (H) 74 - 109 mg/dL    ALKALINE PHOSPHATASE 342 (H) 35 - 129 U/L    ALT (SGPT) 135 (H) 0 - 33 U/L    AST (SGOT) 293 (H) 0 - 32 U/L    BILIRUBIN TOTAL 0.5 0.2 - 1.2 mg/dL    PROTEIN TOTAL 6.6 6.4 - 8.3 g/dL   TROPONIN-T NOW    Collection Time: 07/08/24  9:56 AM   Result Value Ref Range    TROPONIN-T 23 (H) <=14 ng/L    Narrative    In order to distinguish acute elevations of high sensitive Troponin from other clinical conditions, the Universal Definition of myocardial infarction stresses clinical assessment and the need for serial measurements to observe a rise and/or fall above the upper limit of the reference interval.    CBC/DIFF    Collection Time: 07/08/24 10:41 AM    Narrative    The following orders were created for panel order CBC/DIFF.  Procedure                               Abnormality         Status                     ---------                               -----------         ------                     CBC WITH IPQQ[[id number]]                Abnormal            Final result               MANUAL DIFF AND MORPHOLO.SABRASABRA[[id number]]   Abnormal            Final result                 Please view results for these tests on the individual orders.  CBC WITH DIFF    Collection Time: 07/08/24 10:41 AM   Result Value Ref Range    WBC 7.5 3.7 - 11.0 x10^3/uL    RBC 3.38 (L) 3.85 - 5.22 x10^6/uL    HGB 9.3 (L) 11.5 - 16.0 g/dL    HCT 69.4 (L) 65.1 - 46.0 %    MCV 90.2 78.0 - 100.0 fL    MCH 27.5 26.0 - 32.0 pg    MCHC 30.5 (L) 31.0 - 35.5 g/dL    RDW-CV 85.3 88.4 - 84.4 %    PLATELETS 74 (L) 150 - 400 x10^3/uL    Narrative    Mean Platelet Volume - Not Reported   MANUAL DIFF AND MORPHOLOGY-SYSMEX    Collection Time: 07/08/24 10:41 AM   Result Value Ref Range    NEUTROPHIL % 75 %    LYMPHOCYTE %  17 %    MONOCYTE % 4 %    EOSINOPHIL % 0 %    BASOPHIL % 0 %    NEUTROPHIL BANDS % 3   %    REACTIVE LYMPHOCYTE % 1 %    NEUTROPHIL # 5.85 1.50 - 7.70 x10^3/uL    LYMPHOCYTE # 1.35 1.00 - 4.80 x10^3/uL    MONOCYTE # 0.30 0.20 - 1.10 x10^3/uL    EOSINOPHIL # <0.10 <=0.50 x10^3/uL    BASOPHIL # <0.10 <=0.20 x10^3/uL    NRBC FROM MANUAL DIFF 7 (H) <=1 per 100 WBC   TROPONIN-T IN TWO HOURS    Collection Time: 07/08/24 11:47 AM   Result Value Ref Range    TROPONIN-T 21 (H) <=14 ng/L    Narrative    In order to distinguish acute elevations of high sensitive Troponin from other clinical conditions, the Universal Definition of myocardial infarction stresses clinical assessment and the need for serial measurements to observe a rise and/or fall above the upper limit of the reference interval.    Results for orders placed or performed during the hospital encounter of 07/08/24 (from the past 24 hours)   BLOOD CELL COUNT W/DIFF - CANCER CENTER    Collection Time: 07/08/24  8:52 AM    Narrative    The following orders were created for panel order BLOOD CELL COUNT W/DIFF - CANCER CENTER.  Procedure                               Abnormality         Status                     ---------                               -----------         ------                     CBC WITH  DIFF[[id number]]                                                                 Please view results for these tests on the individual orders.        IMAGING AND STUDIES:  Results for orders placed or performed during the hospital encounter of 07/08/24 (from the past 72 hours)   XR AP MOBILE CHEST     Status: None    Narrative    Quetzali JEAN Crisostomo    XR AP MOBILE CHEST performed on 07/08/2024 10:48 AM.    INDICATION:  Chest Pain   Additional History:  chest pain    TECHNIQUE:  Single portable frontal view of the chest.  1 views/1 images submitted for interpretation.    COMPARISON:  Chest x-ray 06/17/2024  ___________________________________  FINDINGS:    Median sternotomy noted. Heart size and vascularity are similar. Right hilar adenopathy. Heart size normal.  Vague groundglass opacities at both lung bases and minimally right upper lobe atelectasis or minimal pneumonitis. No pleural effusion. No pneumothorax. Dextrocurvature thoracic spine.  ___________________________________    Impression    1. Right hilar adenopathy.    2. Vague pulmonary opacities could be mild pneumonia or atelectasis.        Radiologist location ID: [id number]     CT ABDOMEN PELVIS W IV CONTRAST     Status: None    Narrative    Audray JEAN Binette    CT ABDOMEN PELVIS W IV CONTRAST performed on 07/08/2024 1:18 PM.    INDICATION:  abd pain   Additional History:  abdomen pain, nausea, vomiting    TECHNIQUE:  CT abdomen and pelvis with axial, coronal, and sagittal multiplanar reformations, performed with contrast. Dose modulation, automated exposure control, and/or iterative reconstruction were used for dose reduction.    CONTRAST:  100 mL Isovue  370 IV    COMPARISON:  Abdomen CT 06/14/2024  ___________________________________  FINDINGS:    LOWER THORAX: Unremarkable.    HEPATOBILIARY:  Hepatomegaly and hepatic steatosis. Scattered hypodense lesions in the liver redemonstrated not greatly changed and the short-term interval.  Pericholecystic fluid now  present, gallbladder slightly contracted.  No intrahepatic or extrahepatic ductal dilation.  PANCREAS: No focal masses or ductal dilatation.  SPLEEN: No splenomegaly.    ADRENALS: No adrenal nodules.  KIDNEYS/URETERS/BLADDER: No hydronephrosis,. Bilateral renal cyst and multiple bilateral renal calculi redemonstrated.  REPRODUCTIVE: No significant findings.    PERITONEUM / RETROPERITONEUM: New free fluid in the dependent pelvis.  LYMPH NODES: No lymphadenopathy. Small retroperitoneal are unchanged. Lymph nodes  VESSELS: Abdominal aorta is normal in caliber. Severe atherosclerotic disease aorta.    GI TRACT: No distention, wall thickening, or inflammatory stranding.  Appendix is normal. Colon diverticulosis. ABDOMINAL WALL:  Intact.    SOFT TISSUES: Unremarkable.  BONES: Degenerative changes are present in the spine  ___________________________________    Impression    1. Hepatomegaly, hepatic steatosis and hepatic lesions, not greatly changed and new pericholecystic fluid now present.  2. Multiple nonobstructing bilateral renal calculi.  3. New free fluid in the pelvis.      Radiologist location ID: [id number]         ASSESSMENT AND PLAN:  [name] is a 68 y.o. 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  In ED WBC 7.5, HGB 9.3 (was 11.8 on 06/17/2024), chest x-ray notable for hilar adenopathy and bag pulmonary opacities concerning for possible atelectasis versus mild pneumonia.  Patient with slight transaminitis with AST 293, ALT 135, ALP 342 and CT abdomen pelvis notable for hepatomegaly with hepatic steatosis/hepatic lesions not greatly changed  and new pericholecystic fluid present and multiple nonobstructing bilateral renal calculi with new free fluid in the pelvis.  Patient was noted to be saturating in the 80s  on room air.  Requiring 2 L NC to get above 90%.  ED requested evaluation for admission.  Assessment & Plan  Acute hypoxic respiratory failure (CMS HCC)  Acute hypoxic respiratory failure. Consider the possibility of PE, given a patient who is in a hypercoagulable state. We'll get a CT angio chest with PE protocol. We'll also get a respiratory viral panel as family is noting some increased chest congestion and cannot rule out a mild exacerbation of COPD. We'll cover for community-acquired pneumonia with Rocephin and azithromycin. We'll get a CTAP protocol. We'll get a Hemoccult card as well on the possibility that this is truly anemia and monitor hemoglobins. Of note, this could also be worsening progression of the patient's known small cell carcinoma of the lungs. We'll consult the primary oncologist as she follows with Dr. Ludger.  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  Transfuse for hemoglobin less than 7  Transaminitis  Likely secondary to liver metastasis  Malignant neoplasm of lung, unspecified laterality, unspecified part of lung (CMS HCC)  Known history of small-cell carcinoma, planned for outpatient chemotherapy with  carboplatin/etoposide/tecenteriq  Consult Oncology  Pulmonary HTN (CMS [facility])  Consult pulmonology  Mood disorder (CMS HCC)  Continue trazodone                This note was written using voice dictation software.  There may be errors in grammar, punctuation, and syntax inherent with the use of such software that was not caught during editing process.  The original meaning can be determined through context.    [employer], PA-C  07/08/2024, 14:07,

## 2024-07-08 NOTE — ED Provider Notes (Signed)
 [facility] - Emergency Department  ED Physician Note      Arrival: Car    Chief Complaint:    Chief Complaint   Patient presents with    Shortness of Breath     Sent from cancer center for SpO2 88%. Pt has CP, nausea. Hx small cell lung.    Chest Pain     Nausea     [name] is a 68 y.o. female who had concerns including Shortness of Breath, Chest Pain , and Nausea..    History of Present Illness:  Patient presents to the emergency department with complaints of shortness of breath, nausea and fatigue.  She states that symptoms started about 2 days ago and seemed to be getting worse.  She also reports some intermittent chest pain described as sharp midsternal pain.  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  Patient reports that she has been having some increased abdominal pain located in her lower right and left quadrants.  She denies any dark colored stools.  Patient does report that she has been more constipated lately.  Patient denies any recent fever, chills, cough or congestion.  Patient states that she was at the cancer center to receive her 1st chemotherapy but her oxygen saturation was low in the 80s so they sent her to the emergency department for further evaluation.      Shortness of Breath  Associated symptoms: chest pain    Chest Pain   Associated symptoms: nausea and shortness of breath    Nausea  Associated symptoms: chest pain, nausea and shortness of breath        Review of Systems:  Review of Systems   Respiratory:  Positive for shortness of breath.    Cardiovascular:  Positive for chest pain.   Gastrointestinal:  Positive for nausea.     As above, otherwise as per HPI.    PMH/PSH/FH/SH:    Past Medical History:   Diagnosis Date    Abdominal pain     Cancer (CMS HCC)     Chest pain     Chronic lung disease     Coronary artery disease     Dyslipidemia     Essential hypertension     Heart disease      History of percutaneous coronary intervention     History of stress test     Mixed hyperlipidemia     Unspecified chronic bronchitis        Past Surgical History:   Procedure Laterality Date    Cardiac catheterization      Coronary angioplasty      Coronary artery stent placement      Hx back surgery      Hx coronary artery bypass graft      Hx mastectomy, simple Bilateral     Hx tubal ligation      Neck surgery         Family History   Problem Relation Age of Onset    Pacemaker Mother     Arthritis Mother     Hypertension (High Blood Pressure) Mother     Heart Attack Father     Cancer Father     Hypertension (High Blood Pressure) Father     Heart Disease Brother     Arthritis Maternal Grandmother     Cancer Other     Heart Disease Other     Hypertension (High Blood  Pressure) Other     Heart Attack Other        Social History     Socioeconomic History    Marital status: Widowed     Spouse name: Not on file    Number of children: Not on file    Years of education: Not on file    Highest education level: Not on file   Occupational History    Not on file   Tobacco Use    Smoking status: Every Day     Current packs/day: 0.50     Average packs/day: 0.5 packs/day for 52.7 years (26.4 ttl pk-yrs)     Types: Cigarettes     Start date: 72    Smokeless tobacco: Never   Vaping Use    Vaping status: Never Used   Substance and Sexual Activity    Alcohol use: Yes     Comment: occasional    Drug use: Yes     Types: Marijuana    Sexual activity: Not on file   Other Topics Concern    Not on file   Social History Narrative    Not on file     Social Determinants of Health     Financial Resource Strain: Not on file   Transportation Needs: Not on file   Social Connections: Low Risk (06/14/2024)    Social Connections     SDOH Social Isolation: 5 or more times a week   Intimate Partner Violence: Not on file   Housing Stability: Not on file       Meds/Allergies:    Current Outpatient Medications   Medication Instructions    anastrozole   (ARIMIDEX ) 1 mg, Daily    aspirin  81 mg, Oral, Daily    atorvastatin  (LIPITOR) 40 mg, Oral, Daily    cholecalciferol  (vitamin D3) 1,000 Units, Daily    clonazePAM  (KLONOPIN ) 0.5 mg, 2 TIMES DAILY PRN    DULoxetine  (CYMBALTA  DR) 60 mg, Daily    fluticasone  propion-salmeteroL (ADVAIR ) 250-50 mcg/dose Inhalation oral diskus inhaler 1 Inhalation, Inhalation, 2 TIMES DAILY    fluticasone  propionate (FLONASE ) 50 mcg/actuation Nasal Spray, Suspension 1 Spray, DAILY PRN    gabapentin  (NEURONTIN ) 400 mg, Oral, 3 TIMES DAILY    losartan  (COZAAR ) 25 mg, Oral, Daily    metoprolol  tartrate (LOPRESSOR ) 25 mg, 2 TIMES DAILY    MV,Ca,Ir,Min-FA-Lut-Lyco-HC153 3-133-33 mg-mcg-mcg Oral Capsule 1 Capsule, Daily    omega-3 fatty acid (LOVAZA ) 1 gram Oral Capsule 1 Capsule, Daily    ondansetron  (ZOFRAN  ODT) 8 mg, Sublingual, EVERY 8 HOURS PRN, Do not use the first 5 days after treatment    oxyCODONE -acetaminophen  (PERCOCET) 7.5-325 mg Oral Tablet 1 Tablet, Oral, EVERY 4 HOURS PRN    pantoprazole  (PROTONIX ) 40 mg, Daily    prochlorperazine  (COMPAZINE ) 10 mg, Oral, 3 TIMES DAILY PRN    promethazine  (PHENERGAN ) 25 mg, Oral, EVERY 8 HOURS PRN    traZODone  (DESYREL ) 150 mg, NIGHTLY      Allergies[1]    Physical Exam:    ED Triage Vitals [07/08/24 0942]   BP (Non-Invasive) 118/67   Heart Rate 80   Respiratory Rate 18   Temperature 36.7 C (98.1 F)   SpO2 98 %   Weight 62.1 kg (137 lb)   Height 1.549 m (5' 1)     CONSTITUTIONAL: Alert and oriented x 4, responds appropriately to questions, ill-appearing, in mild discomfort, well-nourished  HEAD: Normocephalic, atraumatic  EYES:  PERRL at 4 mm bilaterally; conjunctivae clear, sclerae non-icteric  NECK:  supple without meningismus; nontender to palpation, no lymphadenopathy noted  CARD: RRR; no murmurs, no clicks, rubs or gallops, symmetrical distal pulses noted  RESP:  Normal chest excursion without splinting or tachypnea, no respiratory distress; breath sounds clear and equal bilaterally; no  wheezes, rhonchi or rales  ABD: non-distended, soft with mild lower pelvic pain in right and left lower quadrantes, no rebound tenderness or guarding, hypoactive bowel sounds  BACK:  appears normal; no midline or paravertebral tenderness to palpation, no CVA tenderness  EXT: appears atraumatic, non-tender to palpation; no cyanosis, effusions or edema, pulses present x 4 extremities  SKIN: pale; warm, dry, good turgor, no rashes, lesions or ulcerations noted  NEURO: alert and oriented x 4, clear coherent speech, moves all extremities equally, sensory function intact, cranial nerves II-VII intact  PSYCH: patient's mood and manner are appropriate; grooming and personal hygiene appropriate    Results:    Labs Ordered/Reviewed   NT-PROBNP - Abnormal; Notable for the following components:       Result Value    NT-PROBNP 1,476 (*)     All other components within normal limits   COMPREHENSIVE METABOLIC PANEL, NON-FASTING - Abnormal; Notable for the following components:    BUN 30 (*)     ESTIMATED GFR 80 (*)     GLUCOSE 212 (*)     ALKALINE PHOSPHATASE 342 (*)     ALT (SGPT) 135 (*)     AST (SGOT) 293 (*)     All other components within normal limits   TROPONIN-T - Abnormal; Notable for the following components:    TROPONIN-T 23 (*)     All other components within normal limits    Narrative:     In order to distinguish acute elevations of high sensitive Troponin from other clinical conditions, the Universal Definition of myocardial infarction stresses clinical assessment and the need for serial measurements to observe a rise and/or fall above the upper limit of the reference interval.    TROPONIN-T - Abnormal; Notable for the following components:    TROPONIN-T 21 (*)     All other components within normal limits    Narrative:     In order to distinguish acute elevations of high sensitive Troponin from other clinical conditions, the Universal Definition of myocardial infarction stresses clinical assessment and the need for  serial measurements to observe a rise and/or fall above the upper limit of the reference interval.    CBC WITH DIFF - Abnormal; Notable for the following components:    RBC 3.38 (*)     HGB 9.3 (*)     HCT 30.5 (*)     MCHC 30.5 (*)     PLATELETS 74 (*)     All other components within normal limits    Narrative:     Mean Platelet Volume - Not Reported   MANUAL DIFF AND MORPHOLOGY-SYSMEX - Abnormal; Notable for the following components:    NRBC FROM MANUAL DIFF 7 (*)     All other components within normal limits   EXTENDED RESPIRATORY VIRUS PANEL   CBC/DIFF    Narrative:     The following orders were created for panel order CBC/DIFF.  Procedure                               Abnormality         Status                     ---------                               -----------         ------  CBC WITH IPQQ[[id number]]                Abnormal            Final result               MANUAL DIFF AND MORPHOLO.SABRASABRA[[id number]]  Abnormal            Final result                 Please view results for these tests on the individual orders.       CT ABDOMEN PELVIS W IV CONTRAST   Final Result by Edi, Radresults In (09/29 1340)      1. Hepatomegaly, hepatic steatosis and hepatic lesions, not greatly changed and new pericholecystic fluid now present.   2. Multiple nonobstructing bilateral renal calculi.   3. New free fluid in the pelvis.         Radiologist location ID: [id number]         XR AP MOBILE CHEST   Final Result by Edi, Radresults In (09/29 1119)      1. Right hilar adenopathy.      2. Vague pulmonary opacities could be mild pneumonia or atelectasis.            Radiologist location ID: [id number]             ED Course:    ED Course as of 07/08/24 1429   Mon Jul 08, 2024   1427 CBC/DIFF(!)  RBC 3.38 hemoglobin 9.3, hematocrit 30.5 platelets 74   1428 COMPREHENSIVE METABOLIC PANEL, NON-FASTING(!)  BUN 30, GFR 80, glucose 212, alk-phos 342, ALT 135, AST 293   1428 NT-PROBNP(!): 1,476  Mildly elevated at 1476    1429 TROPONIN-T IN TWO HOURS(!)  Initial troponin of 23 with a repeat of 21   1429 ECG 12 LEAD  EKG INTERPRETATION:   No Acute ischemia   EKG interpretation done by me  Rhythm - Sinus tachycardia  PR - normal  QRS - normal  ST and T wave: normal  good tracing   interpreted contemporaneously by me.     1429 CT ABDOMEN PELVIS W IV CONTRAST  Hepatomegaly, hepatic steatosis and hepatic lesions, not greatly changed and new pericholecystic fluid now present.  2.Multiple nonobstructing bilateral renal calculi.  3.New free fluid in the pelvis.     1429 XR AP MOBILE CHEST  Right hilar adenopathy.     2.Vague pulmonary opacities could be mild pneumonia or atelectasis.            Medications Ordered/Administered in the ED   fentaNYL (SUBLIMAZE) 50 mcg/mL injection (50 mcg Intravenous Given 07/08/24 1256)   ondansetron  (ZOFRAN ) 2 mg/mL injection (4 mg Intravenous Given 07/08/24 1255)   iopamidol  (ISOVUE -370) 76% infusion (100 mL Intravenous Given 07/08/24 1319)   morphine  4 mg/mL injection (4 mg Intravenous Given 07/08/24 1350)           MDM:       Medical Decision Making  Patient and significant decline in hemoglobin from 3 weeks ago.  Patient was recently diagnosed with lung cancer and was supposed to have her 1st chemotherapy appointment today, however patient was found to have low oxygen saturation and complaining of shortness of breath.  Patient reports that she has also been feeling fatigued.  This could likely be related to patient's drop in hemoglobin.  Patient's CT scan of abdomen pelvis was unremarkable noted with the acute new findings.  Patient will be admitted  to the hospital for further evaluation treatment.  Patient did agree and hospitalist notified who agreed to take patient as admission.    Amount and/or Complexity of Data Reviewed  Radiology: ordered. Decision-making details documented in ED Course.  ECG/medicine tests: independent interpretation performed. Decision-making details documented in ED  Course.    Risk  Prescription drug management.  Parenteral controlled substances.  Decision regarding hospitalization.        See ED Course for further MDM decisions.    Impression:    Clinical Impression   Symptomatic anemia (Primary)   Malignant neoplasm of lung, unspecified laterality, unspecified part of lung (CMS HCC)         Disposition:   Admitted        Part of this note may have been generated using voice recognition software.  Be advised, it is possible that the generated note may be prone to syntax and other dictation software errors.           [1] No Known Allergies

## 2024-07-09 ENCOUNTER — Inpatient Hospital Stay (HOSPITAL_COMMUNITY)

## 2024-07-09 ENCOUNTER — Other Ambulatory Visit: Payer: Self-pay

## 2024-07-09 ENCOUNTER — Ambulatory Visit (HOSPITAL_BASED_OUTPATIENT_CLINIC_OR_DEPARTMENT_OTHER): Payer: Self-pay

## 2024-07-09 ENCOUNTER — Encounter: Payer: Self-pay | Admitting: Internal Medicine

## 2024-07-09 ENCOUNTER — Other Ambulatory Visit: Payer: Self-pay | Admitting: Hematology and Oncology

## 2024-07-09 DIAGNOSIS — D62 Acute posthemorrhagic anemia: Secondary | ICD-10-CM | POA: Insufficient documentation

## 2024-07-09 DIAGNOSIS — F39 Unspecified mood [affective] disorder: Secondary | ICD-10-CM | POA: Insufficient documentation

## 2024-07-09 DIAGNOSIS — E785 Hyperlipidemia, unspecified: Secondary | ICD-10-CM

## 2024-07-09 DIAGNOSIS — I509 Heart failure, unspecified: Secondary | ICD-10-CM

## 2024-07-09 DIAGNOSIS — D509 Iron deficiency anemia, unspecified: Secondary | ICD-10-CM

## 2024-07-09 DIAGNOSIS — F17210 Nicotine dependence, cigarettes, uncomplicated: Secondary | ICD-10-CM

## 2024-07-09 DIAGNOSIS — I272 Pulmonary hypertension, unspecified: Secondary | ICD-10-CM

## 2024-07-09 DIAGNOSIS — I2510 Atherosclerotic heart disease of native coronary artery without angina pectoris: Secondary | ICD-10-CM

## 2024-07-09 DIAGNOSIS — D696 Thrombocytopenia, unspecified: Secondary | ICD-10-CM

## 2024-07-09 DIAGNOSIS — D649 Anemia, unspecified: Principal | ICD-10-CM | POA: Insufficient documentation

## 2024-07-09 DIAGNOSIS — C3490 Malignant neoplasm of unspecified part of unspecified bronchus or lung: Secondary | ICD-10-CM

## 2024-07-09 DIAGNOSIS — K828 Other specified diseases of gallbladder: Secondary | ICD-10-CM

## 2024-07-09 DIAGNOSIS — R7401 Elevation of levels of liver transaminase levels: Secondary | ICD-10-CM

## 2024-07-09 DIAGNOSIS — J1282 Pneumonia due to coronavirus disease 2019: Secondary | ICD-10-CM | POA: Insufficient documentation

## 2024-07-09 DIAGNOSIS — U071 COVID-19: Secondary | ICD-10-CM

## 2024-07-09 DIAGNOSIS — K838 Other specified diseases of biliary tract: Secondary | ICD-10-CM

## 2024-07-09 DIAGNOSIS — C3431 Malignant neoplasm of lower lobe, right bronchus or lung: Secondary | ICD-10-CM

## 2024-07-09 DIAGNOSIS — J9601 Acute respiratory failure with hypoxia: Secondary | ICD-10-CM

## 2024-07-09 DIAGNOSIS — R748 Abnormal levels of other serum enzymes: Secondary | ICD-10-CM

## 2024-07-09 DIAGNOSIS — C787 Secondary malignant neoplasm of liver and intrahepatic bile duct: Secondary | ICD-10-CM

## 2024-07-09 DIAGNOSIS — Z86711 Personal history of pulmonary embolism: Secondary | ICD-10-CM

## 2024-07-09 LAB — MANUAL DIFF AND MORPHOLOGY-SYSMEX
BASOPHIL #: <0.1 x10ˆ3/uL (ref <=0.20)
BASOPHIL %: 0 %
EOSINOPHIL #: <0.1 x10ˆ3/uL (ref <=0.50)
EOSINOPHIL %: 1 %
LYMPHOCYTE #: 2.31 x10ˆ3/uL (ref 1.00–4.80)
LYMPHOCYTE %: 33 %
MONOCYTE #: 0.14 x10ˆ3/uL — ABNORMAL LOW (ref 0.20–1.10)
MONOCYTE %: 2 %
NEUTROPHIL #: 4.48 x10ˆ3/uL (ref 1.50–7.70)
NEUTROPHIL %: 61 %
NEUTROPHIL BANDS %: 3 %
NRBC FROM MANUAL DIFF: 6 /100{WBCs} — ABNORMAL HIGH (ref <=1)

## 2024-07-09 LAB — BASIC METABOLIC PANEL
ANION GAP: 14 mmol/L (ref 7–18)
BUN/CREA RATIO: 41
BUN: 24 mg/dL — ABNORMAL HIGH (ref 8–23)
CALCIUM: 8.9 mg/dL (ref 8.3–10.7)
CHLORIDE: 104 mmol/L (ref 96–106)
CO2 TOTAL: 24 mmol/L (ref 22–30)
CREATININE: 0.59 mg/dL (ref 0.50–0.90)
ESTIMATED GFR: >90 mL/min/1.73mˆ2 (ref >90)
GLUCOSE: 104 mg/dL (ref 74–109)
POTASSIUM: 3.6 mmol/L (ref 3.2–5.0)
SODIUM: 142 mmol/L (ref 133–144)

## 2024-07-09 LAB — CBC WITH DIFF
HCT: 25.6 % — ABNORMAL LOW (ref 34.8–46.0)
HGB: 7.7 g/dL — ABNORMAL LOW (ref 11.5–16.0)
MCH: 27.8 pg (ref 26.0–32.0)
MCHC: 30.1 g/dL — ABNORMAL LOW (ref 31.0–35.5)
MCV: 92.4 fL (ref 78.0–100.0)
MPV: 10.8 fL (ref 8.7–12.5)
PLATELETS: 52 x10ˆ3/uL — ABNORMAL LOW (ref 150–400)
RBC: 2.77 x10ˆ6/uL — ABNORMAL LOW (ref 3.85–5.22)
RDW-CV: 14.8 % (ref 11.5–15.5)
WBC: 7 x10ˆ3/uL (ref 3.7–11.0)

## 2024-07-09 LAB — PATH COMMENT: PATHOLOGIST INTERPRETATION: ABNORMAL — AB

## 2024-07-09 LAB — MAGNESIUM: MAGNESIUM: 1.8 mg/dL (ref 1.6–2.4)

## 2024-07-09 LAB — HEPATIC FUNCTION PANEL
ALBUMIN: 2.9 g/dL — ABNORMAL LOW (ref 3.5–5.2)
ALKALINE PHOSPHATASE: 358 U/L — ABNORMAL HIGH (ref 35–129)
ALT (SGPT): 140 U/L — ABNORMAL HIGH (ref 0–33)
AST (SGOT): 376 U/L — ABNORMAL HIGH (ref 0–32)
BILIRUBIN DIRECT: 0.3 mg/dL (ref 0.0–0.3)
BILIRUBIN TOTAL: 0.5 mg/dL (ref 0.2–1.2)
PROTEIN TOTAL: 5.7 g/dL — ABNORMAL LOW (ref 6.4–8.3)

## 2024-07-09 LAB — PT/INR
INR: 1.27 — ABNORMAL HIGH (ref 0.86–1.14)
PROTHROMBIN TIME: 16.3 s — ABNORMAL HIGH (ref 12.1–15.3)

## 2024-07-09 LAB — C-REACTIVE PROTEIN(CRP),INFLAMMATION: C-REACTIVE PROTEIN (CRP): 4.2 mg/dL — ABNORMAL HIGH (ref 0.0–0.9)

## 2024-07-09 LAB — TRANSTHORACIC ECHOCARDIOGRAM - ADULT
EF VISUAL ESTIMATE: 55
EF: 60

## 2024-07-09 LAB — LDH: LDH: >2500 U/L — ABNORMAL HIGH (ref 135–214)

## 2024-07-09 MED ORDER — DEXAMETHASONE 4 MG TABLET
6.0000 mg | ORAL_TABLET | Freq: Every day | ORAL | Status: DC
Start: 2024-07-09 — End: 2024-07-17
  Administered 2024-07-09 – 2024-07-10 (×2): 6 mg via ORAL
  Administered 2024-07-11: 0 mg via ORAL
  Administered 2024-07-12 – 2024-07-16 (×5): 6 mg via ORAL
  Filled 2024-07-09 (×9): qty 1

## 2024-07-09 MED ORDER — BENZONATATE 100 MG CAPSULE
100.0000 mg | ORAL_CAPSULE | Freq: Three times a day (TID) | ORAL | Status: DC | PRN
Start: 2024-07-09 — End: 2024-07-17
  Administered 2024-07-09 – 2024-07-16 (×2): 100 mg via ORAL
  Filled 2024-07-09 (×2): qty 1

## 2024-07-09 MED ORDER — SULFUR HEXAFLUORIDE MICROSPHERES 25 MG INTRAVENOUS SUSPENSION
2.5000 mL | INHALATION_SUSPENSION | INTRAVENOUS | Status: AC
Start: 2024-07-09 — End: 2024-07-09
  Administered 2024-07-09: 2.5 mL via INTRAVENOUS

## 2024-07-09 MED ORDER — HYDROCODONE 5 MG-ACETAMINOPHEN 325 MG TABLET
1.0000 | ORAL_TABLET | Freq: Four times a day (QID) | ORAL | Status: DC | PRN
Start: 2024-07-09 — End: 2024-07-17
  Administered 2024-07-09 – 2024-07-14 (×11): 1 via ORAL
  Filled 2024-07-09 (×11): qty 1

## 2024-07-09 NOTE — Assessment & Plan Note (Signed)
 Known history of small-cell carcinoma, planned for outpatient chemotherapy with carboplatin/etoposide/tecenteriq  Consult Oncology

## 2024-07-09 NOTE — Assessment & Plan Note (Signed)
S/p mastectomy 

## 2024-07-09 NOTE — ED Nurses Note (Signed)
 Report given to Kristina on 4P at this time

## 2024-07-09 NOTE — Assessment & Plan Note (Signed)
 Consult pulmonology

## 2024-07-09 NOTE — Assessment & Plan Note (Signed)
 Positive on viral panel with new oxygen requirement  Treat with DuoNebs and Decadron   Empirically treating with antibiotics for probable community-acquired pneumonia

## 2024-07-09 NOTE — Assessment & Plan Note (Signed)
 Rate controlled, continue metoprolol , not on anticoagulation

## 2024-07-09 NOTE — Assessment & Plan Note (Signed)
 Worsening metastatic liver disease likely causing elevated transaminases

## 2024-07-09 NOTE — Assessment & Plan Note (Signed)
 History of CABG x1   Continue aspirin , atorvastatin , metoprolol   No active anginal symptoms

## 2024-07-09 NOTE — Assessment & Plan Note (Signed)
 Patient seems to have symptomatic anemia, hemoglobin dropped from 11.8-9.3 and today 7.7   Likely secondary to acute illness or metastatic disease  Follow up on iron -deficiency panel, nutritional deficiencies.  Transfuse for hemoglobin less than 7

## 2024-07-09 NOTE — Consults (Signed)
 [facility]  Hematology/Oncology    Consult progress Note      [name], [name], 68 y.o. female  Date of Birth:  [birth year]  Inpatient Admission Date:  07/08/2024  Date of Service: 07/09/2024    Chief Complaint:  Known patient - history of small-cell lung CA with liver metastases    Subjective: Patient resting on bed.    HPI:  This is a 68 year old female who presented outpatient infusion center today for Tecentriq/etoposide/carbo treatment when she reported worsening dyspnea to nursing staff.  She was found to be 89% on room air.  Was then placed on 2 L nasal cannula O2 which increased her to 94% oxygenation.  Also c/o worsening generalized pain that is worse in the legs as well as productive cough with yellow sputum.  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    Review of Systems:   Review of Systems   Constitutional:  Positive for fatigue. Negative for activity change, appetite change, chills, diaphoresis, fever and unexpected weight change.   HENT:  Negative for congestion, ear pain, facial swelling, hearing loss, mouth sores, nosebleeds, sinus pain, sore throat, tinnitus, trouble swallowing and voice change.    Eyes:  Negative for photophobia, pain and visual disturbance.   Respiratory:  Positive for cough and shortness of breath. Negative for apnea, choking, chest tightness, wheezing and stridor.    Cardiovascular:  Negative for chest pain, palpitations and leg swelling.   Gastrointestinal:  Negative for abdominal distention, abdominal pain, anal bleeding, blood in stool, constipation, diarrhea, nausea, rectal pain and vomiting.   Endocrine: Negative for cold intolerance and heat intolerance.   Genitourinary:  Negative for decreased urine volume, difficulty urinating, dysuria, flank pain, frequency, hematuria and urgency.   Musculoskeletal:  Positive for arthralgias and myalgias. Negative for back pain, gait problem,  joint swelling and neck pain.   Skin:  Negative for color change, pallor, rash and wound.   Allergic/Immunologic: Negative for immunocompromised state.   Neurological:  Negative for dizziness, syncope, facial asymmetry, speech difficulty, weakness, light-headedness, numbness and headaches.   Hematological:  Negative for adenopathy. Does not bruise/bleed easily.   Psychiatric/Behavioral:  Negative for agitation, behavioral problems and confusion.      Past Medical History:   Diagnosis Date    Abdominal pain     Cancer (CMS HCC)     Chest pain     Chronic lung disease     Coronary artery disease     Dyslipidemia     Essential hypertension     Heart disease     History of percutaneous coronary intervention     History of stress test     Mixed hyperlipidemia     Unspecified chronic bronchitis      Past Surgical History:   Procedure Laterality Date    CARDIAC CATHETERIZATION      CORONARY ANGIOPLASTY      CORONARY ARTERY STENT PLACEMENT      HX BACK SURGERY      HX CORONARY ARTERY BYPASS GRAFT      HX MASTECTOMY, SIMPLE Bilateral     HX TUBAL LIGATION      NECK SURGERY       Social History     Socioeconomic History    Marital status: Widowed     Spouse name: Not on file    Number of children: Not on file    Years of education: Not  on file    Highest education level: Not on file   Occupational History    Not on file   Tobacco Use    Smoking status: Every Day     Current packs/day: 0.50     Average packs/day: 0.5 packs/day for 52.7 years (26.4 ttl pk-yrs)     Types: Cigarettes     Start date: 1973    Smokeless tobacco: Never   Vaping Use    Vaping status: Never Used   Substance and Sexual Activity    Alcohol use: Yes     Comment: occasional    Drug use: Yes     Types: Marijuana    Sexual activity: Not on file   Other Topics Concern    Not on file   Social History Narrative    Not on file     Social Determinants of Health     Financial Resource Strain: Not on file   Transportation Needs: Not on file   Social Connections:  Low Risk (06/14/2024)    Social Connections     SDOH Social Isolation: 5 or more times a week   Intimate Partner Violence: Not on file   Housing Stability: Not on file      Family Medical History:       Problem Relation (Age of Onset)    Arthritis Mother, Maternal Grandmother    Cancer Father, Other    Heart Attack Father, Other    Heart Disease Brother, Other    Hypertension (High Blood Pressure) Mother, Father, Other    Pacemaker Mother          Vital Signs:  Temp  Avg: 36.8 C (98.2 F)  Min: 36.6 C (97.9 F)  Max: 36.9 C (98.4 F)    Pulse  Avg: 83.6  Min: 75  Max: 101 BP  Min: 129/75  Max: 159/98   Resp  Avg: 19.4  Min: 15  Max: 24 SpO2  Avg: 96 %  Min: 90 %  Max: 100 %          Input/Output    Intake/Output Summary (Last 24 hours) at 07/09/2024 1312  Last data filed at 07/08/2024 1746  Gross per 24 hour   Intake 300 ml   Output --   Net 300 ml    I/O last shift:  No intake/output data recorded.     LABS:   Results for orders placed or performed during the hospital encounter of 07/08/24 (from the past 24 hours)   EXTENDED RESPIRATORY VIRUS PANEL    Specimen: Nasopharyngeal Swab   Result Value Ref Range    ADENOVIRUS ARRAY Not Detected Not Detected    CORONAVIRUS 229E Not Detected Not Detected    CORONAVIRUS HKU1 Not Detected Not Detected    CORONAVIRUS NL63 Not Detected Not Detected    CORONAVIRUS OC43 Not Detected Not Detected    SARS CORONAVIRUS 2 (SARS-CoV-2) Detected (A) Not Detected    METAPNEUMOVIRUS ARRAY Not Detected Not Detected    RHINOVIRUS/ENTEROVIRUS ARRAY Not Detected Not Detected    INFLUENZA A Not Detected Not Detected    INFLUENZA B ARRAY Not Detected Not Detected    PARAINFLUENZA 1 ARRAY Not Detected Not Detected    PARAINFLUENZA 2 ARRAY Not Detected Not Detected    PARAINFLUENZA 3 ARRAY Not Detected Not Detected    PARAINFLUENZA 4 ARRAY Not Detected Not Detected    RSV ARRAY Not Detected Not Detected    BORDETELLA PARAPERTUSSIS (IS 1001) Not Detected Not Detected  BORDETELLA PERTUSSIS ARRAY  Not Detected Not Detected    CHLAMYDOPHILA PNEUMONIAE ARRAY Not Detected Not Detected    MYCOPLASMA PNEUMONIAE ARRAY Not Detected Not Detected   LDH   Result Value Ref Range    LDH >2,500 (H) 135 - 214 U/L   VITAMIN B12   Result Value Ref Range    VITAMIN B 12 1,106 232 - 1,245 pg/mL   FOLATE   Result Value Ref Range    FOLATE 3.5 (L) 4.6 - 34.8 ng/mL   FERRITIN   Result Value Ref Range    FERRITIN >2,000 (H) 13 - 150 ng/mL   IRON  TRANSFERRIN AND TIBC   Result Value Ref Range    IRON  (TRANSFERRIN) SATURATION 47 20 - 55 %    IRON  146 (H) 37 - 145 ug/dL    TOTAL IRON  BINDING CAPACITY 309 112 - 347 ug/dL    TRANSFERRIN 778 799 - 360 mg/dL   C-REACTIVE PROTEIN (CRP)   Result Value Ref Range    C-REACTIVE PROTEIN (CRP) 3.5 (H) 0.0 - 0.9 mg/dL   TRANSTHORACIC ECHOCARDIOGRAM - ADULT   Result Value Ref Range    EF VISUAL ESTIMATE 55     EF 60    BASIC METABOLIC PANEL, NON-FASTING   Result Value Ref Range    SODIUM 142 133 - 144 mmol/L    POTASSIUM 3.6 3.2 - 5.0 mmol/L    CHLORIDE 104 96 - 106 mmol/L    CO2 TOTAL 24 22 - 30 mmol/L    ANION GAP 14 7 - 18 mmol/L    CALCIUM 8.9 8.3 - 10.7 mg/dL    GLUCOSE 895 74 - 890 mg/dL    BUN 24 (H) 8 - 23 mg/dL    CREATININE 9.40 9.49 - 0.90 mg/dL    BUN/CREA RATIO 41     ESTIMATED GFR >90 >90 mL/min/1.24m^2   MAGNESIUM    Result Value Ref Range    MAGNESIUM  1.8 1.6 - 2.4 mg/dL   HEPATIC FUNCTION PANEL   Result Value Ref Range    ALBUMIN 2.9 (L) 3.5 - 5.2 g/dL    ALKALINE PHOSPHATASE 358 (H) 35 - 129 U/L    ALT (SGPT) 140 (H) 0 - 33 U/L    AST (SGOT) 376 (H) 0 - 32 U/L    BILIRUBIN TOTAL 0.5 0.2 - 1.2 mg/dL    BILIRUBIN DIRECT 0.3 0.0 - 0.3 mg/dL    PROTEIN TOTAL 5.7 (L) 6.4 - 8.3 g/dL   C-REACTIVE PROTEIN(CRP),INFLAMMATION   Result Value Ref Range    C-REACTIVE PROTEIN (CRP) 4.2 (H) 0.0 - 0.9 mg/dL   CBC WITH DIFF   Result Value Ref Range    WBC 7.0 3.7 - 11.0 x10^3/uL    RBC 2.77 (L) 3.85 - 5.22 x10^6/uL    HGB 7.7 (L) 11.5 - 16.0 g/dL    HCT 74.3 (L) 65.1 - 46.0 %    MCV 92.4 78.0 -  100.0 fL    MCH 27.8 26.0 - 32.0 pg    MCHC 30.1 (L) 31.0 - 35.5 g/dL    RDW-CV 85.1 88.4 - 84.4 %    PLATELETS 52 (L) 150 - 400 x10^3/uL    MPV 10.8 8.7 - 12.5 fL   MANUAL DIFF AND MORPHOLOGY-SYSMEX   Result Value Ref Range    NEUTROPHIL % 61 %    LYMPHOCYTE %  33 %    MONOCYTE % 2 %    EOSINOPHIL % 1 %    BASOPHIL % 0 %  NEUTROPHIL BANDS % 3   %    NEUTROPHIL # 4.48 1.50 - 7.70 x10^3/uL    LYMPHOCYTE # 2.31 1.00 - 4.80 x10^3/uL    MONOCYTE # 0.14 (L) 0.20 - 1.10 x10^3/uL    EOSINOPHIL # <0.10 <=0.50 x10^3/uL    BASOPHIL # <0.10 <=0.20 x10^3/uL    NRBC FROM MANUAL DIFF 6 (H) <=1 per 100 WBC    ROULEAUX Present (A) None   PATH COMMENT   Result Value Ref Range    PATHOLOGIST INTERPRETATION Abnormal Study, See Interpretation (A) Normal Study   LDH   Result Value Ref Range    LDH >2,500 (H) 135 - 214 U/L   PT/INR   Result Value Ref Range    PROTHROMBIN TIME 16.3 (H) 12.1 - 15.3 seconds    INR 1.27 (H) 0.86 - 1.14     IMAGES:  Results for orders placed or performed during the hospital encounter of 07/08/24 (from the past 72 hours)   XR AP MOBILE CHEST     Status: None    Narrative    Clotine JEAN Nomura    XR AP MOBILE CHEST performed on 07/08/2024 10:48 AM.    INDICATION:  Chest Pain   Additional History:  chest pain    TECHNIQUE:  Single portable frontal view of the chest.  1 views/1 images submitted for interpretation.    COMPARISON:  Chest x-ray 06/17/2024  ___________________________________  FINDINGS:    Median sternotomy noted. Heart size and vascularity are similar. Right hilar adenopathy. Heart size normal.  Vague groundglass opacities at both lung bases and minimally right upper lobe atelectasis or minimal pneumonitis. No pleural effusion. No pneumothorax. Dextrocurvature thoracic spine.  ___________________________________    Impression    1. Right hilar adenopathy.    2. Vague pulmonary opacities could be mild pneumonia or atelectasis.        Radiologist location ID: [id number]     CT ABDOMEN PELVIS W IV  CONTRAST     Status: None    Narrative    Juri JEAN Karras    CT ABDOMEN PELVIS W IV CONTRAST performed on 07/08/2024 1:18 PM.    INDICATION:  abd pain   Additional History:  abdomen pain, nausea, vomiting    TECHNIQUE:  CT abdomen and pelvis with axial, coronal, and sagittal multiplanar reformations, performed with contrast. Dose modulation, automated exposure control, and/or iterative reconstruction were used for dose reduction.    CONTRAST:  100 mL Isovue  370 IV    COMPARISON:  Abdomen CT 06/14/2024  ___________________________________  FINDINGS:    LOWER THORAX: Unremarkable.    HEPATOBILIARY:  Hepatomegaly and hepatic steatosis. Scattered hypodense lesions in the liver redemonstrated not greatly changed and the short-term interval.  Pericholecystic fluid now present, gallbladder slightly contracted.  No intrahepatic or extrahepatic ductal dilation.  PANCREAS: No focal masses or ductal dilatation.  SPLEEN: No splenomegaly.    ADRENALS: No adrenal nodules.  KIDNEYS/URETERS/BLADDER: No hydronephrosis,. Bilateral renal cyst and multiple bilateral renal calculi redemonstrated.  REPRODUCTIVE: No significant findings.    PERITONEUM / RETROPERITONEUM: New free fluid in the dependent pelvis.  LYMPH NODES: No lymphadenopathy. Small retroperitoneal are unchanged. Lymph nodes  VESSELS: Abdominal aorta is normal in caliber. Severe atherosclerotic disease aorta.    GI TRACT: No distention, wall thickening, or inflammatory stranding.  Appendix is normal. Colon diverticulosis. ABDOMINAL WALL:  Intact.    SOFT TISSUES: Unremarkable.  BONES: Degenerative changes are present in the spine  ___________________________________    Impression  1. Hepatomegaly, hepatic steatosis and hepatic lesions, not greatly changed and new pericholecystic fluid now present.  2. Multiple nonobstructing bilateral renal calculi.  3. New free fluid in the pelvis.      Radiologist location ID: [id number]     CT ANGIO CHEST FOR PULMONARY EMBOLUS W IV  CONTRAST     Status: None    Narrative    Brittie JEAN Shreiner    CT ANGIO CHEST FOR PULMONARY EMBOLUS W IV CONTRAST performed on 07/08/2024 4:34 PM.    INDICATION:  PE   Additional History:  Dyspnea, chest pain, history of small cell lung cancer, coronary stent, and CABG.    TECHNIQUE:  CT angiography of the chest performed for evaluation of the pulmonary arteries, with axial, sagittal, and coronal multiplanar reformations and a coronal 3D MIP.  Dose modulation, automated exposure control, and/or iterative reconstruction were used for dose reduction.    CONTRAST: 75 mL of Isovue  370 IV    COMPARISON:  June 14, 2024  ___________________________________  FINDINGS:    HEART / GREAT VESSELS: There are no filling defects in the pulmonary arteries to suggest a pulmonary embolism.  The heart is normal in size without pericardial effusion.  The thoracic aorta is unremarkable.     LUNGS / AIRWAYS: Focal area of scarring is noted in the right upper lobe. No significant interval change compared to the prior study. Right infrahilar masslike lesion is again noted, demonstrates marginal increase in size compared to the prior study now measuring approximately 3.8 x 3.3 cm. Interlobular emphysematous changes are noted bilaterally. No lobar consolidation, pleural effusions or pneumothorax.  There is narrowing of the right mainstem and right segmental and proximal subsegmental bronchioles.. There are no suspicious pulmonary nodules.    MEDIASTINUM: There is right supraclavicular lymphadenopathy, measuring 2.2 cm, marginally increased in size compared to the prior study. Few scattered right supraclavicular lymph nodes are unchanged. Marginal increase in the size of the right paratracheal conglomerate lymph nodal mass now measuring 2.3 cm (previously 1.9 cm. Marginal increase in the right hilar and infrahilar lymphadenopathy. The subcarinal lymph node measures 3 cm.    UPPER ABDOMEN: Few scattered left para-aortic lymph nodes are  noted, not significant by size criteria. These are partially visualized on the current study. Previously noted liver lesions are not well identified on the current study due to the phase of IV contrast.    BONES:  ACDF of the lower cervical spine. Median sternotomy and mediastinal clips are noted.    OTHER:  The visualized thyroid gland is unremarkable.  ___________________________________    Impression    1. No evidence of pulmonary embolism.  2. Marginal increase in the size of the right infrahilar mass, right hilar and mediastinal lymphadenopathy compared to the prior study.  3. Other chronic findings as described above.        Radiologist location ID: [id number]       Physical Exam:  Physical Exam  Vitals reviewed.   Constitutional:       Appearance: She is normal weight. She is ill-appearing.   HENT:      Head: Normocephalic.      Nose: Nose normal.      Mouth/Throat:      Pharynx: Oropharynx is clear.   Eyes:      Extraocular Movements: Extraocular movements intact.      Conjunctiva/sclera: Conjunctivae normal.   Cardiovascular:      Rate and Rhythm: Normal rate.      Pulses: Normal pulses.  Pulmonary:      Effort: Pulmonary effort is normal.   Abdominal:      Palpations: Abdomen is soft.   Musculoskeletal:         General: Normal range of motion.      Cervical back: Normal range of motion.   Skin:     General: Skin is dry.   Neurological:      General: No focal deficit present.      Mental Status: She is alert and oriented to person, place, and time. Mental status is at baseline.   Psychiatric:         Mood and Affect: Mood normal.         Behavior: Behavior normal.         Thought Content: Thought content normal.         Judgment: Judgment normal.       Problem List:  Active Hospital Problems   (*Primary Problem)    Diagnosis    *Acute hypoxic respiratory failure (CMS HCC)    COVID-19    Symptomatic anemia    Transaminitis    Malignant neoplasm of lung, unspecified laterality, unspecified part of lung  (CMS HCC)    Pulmonary HTN (CMS HCC)    Metastatic cancer to liver    History of breast cancer     Chronic    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)      Assessment/Plan:    68 year old female presenting with acute hypoxic respiratory failure:    Acute hypoxic respiratory failure:  CXR revealing vague pulmonary opacities that could be mild PNA or atelectasis.  She is COVID positive.  CTA chest to rule out PE to be completed.  She is on IV antibiotics.    Small-cell lung CA with liver metastasis:  Follows with Medical Oncology on outpatient basis at [facility].  She was to receive carboplatin/etoposide/Tecentriq today, 07/08/2024, when she presented with acute respiratory symptoms that prompted further evaluation by the emergency room as stated in the above HPI.  Withhold current chemotherapy regimen until resolution of acute conditions.    Normocytic hypochromic anemia:     No acute blood loss noted.  Could be secondary to acute illness or metastatic disease.  Still, obtain anemia panel.  Ruled out nutritional deficiencies including B12, folate, and copper.   Noted transaminitis as well as elevated alk-phos.  This is likely related to liver mets.  Continue to monitor counts.  Transfuse with PRBC if hemoglobin less than 7 grams/deciliter or greater than 7 and symptomatic.  Will benefit from blood transfusion.    Thrombocytopenia:  Current counts = 52,000.  No overt bruising or bleeding.  Likely secondary to underlying metastatic disease.  Defer further thrombocytopenia panel at this time.  Continue to monitor counts.  Transfuse with platelets if <50 K and actively bleeding or <10 K w/wo bleeding.    Hypertension: Managed with antihypertensives.    HLD:  On statin.    CAD:  History of CABG x1.  On cardioprotective medication, 81 mg ASA daily.

## 2024-07-09 NOTE — Assessment & Plan Note (Signed)
Continue losartan, metoprolol.

## 2024-07-09 NOTE — Consults (Signed)
 [facility]   GI Consult    Shantrice, Rodenberg, 68 y.o. female  Date of Admission:  07/08/2024  Date of Birth:  November 08, 1955    Reason for Consult:  Rule out GI bleed  Date of Consult: 07/09/24     History of Present Illness:  Jacie Tristan is a 68 y.o. 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  GI consulted to evaluate anemia and rule out GI bleed.  Patient denies melena, hematochezia, hematemesis or coffee-ground emesis.  Does report some mid abdominal discomfort as well as nausea and constipation.  Patient also reports shortness of breath and significant pain in her legs.  Patient denies nosebleed, gum bleeding, hematuria or vaginal bleeding.  [name] being followed by her oncologist during this admission as well and anemia thought to be related to acute illness or metastatic disease.  Patient reports last colonoscopy approximately 10 years ago and has never had EGD.  Patient does have a history of CABG in April of 2024 by Dr. Rolan but is not currently on antiplatelet therapy, which appears to be due to significant nosebleeds.    Hemoglobin was 9.3 on admission currently 7.7, MCV 92, platelet count was 74 and currently 52.  BUN 24, creatinine 0.5.  Iron  elevated at 146, TIBC 309, iron  sat 47, transferrin 221 and ferritin greater than 2000.  Total bili 0.3, AST 376, ALT 140, alk-phos 358.  Folate low at 3.5, B12 1106.  SARS coronavirus 2 positive.  Chest x-ray showed right hilar adenopathy, vague pulmonary opacities.  CT abdomen pelvis showed hepatomegaly, hepatic steatosis and hepatic lesions not greatly changed and new pericholecystic fluid now present.  CT chest showed marginal increase in size of right infrahilar mass, right hilar and mediastinal lymphadenopathy compared to previous.  Review of Systems:  All pertinent review of systems as  address and detailed in HPI    Past Medical History:   Diagnosis Date    Abdominal pain     Cancer (CMS HCC)     Chest pain     Chronic lung disease     Coronary artery disease     Dyslipidemia     Essential hypertension     Heart disease     History of percutaneous coronary intervention     History of stress test     Mixed hyperlipidemia     Unspecified chronic bronchitis          Past Surgical History:   Procedure Laterality Date    CARDIAC CATHETERIZATION      CORONARY ANGIOPLASTY      CORONARY ARTERY STENT PLACEMENT      HX BACK SURGERY      HX CORONARY ARTERY BYPASS GRAFT      HX MASTECTOMY, SIMPLE Bilateral     HX TUBAL LIGATION      NECK SURGERY      ULTRASOUND ENDOBRONCHIAL N/A 06/17/2024    Performed by Desiderio Elsie Blunt, MD at [facility] OR ENDO      Medications Prior to Admission       Prescriptions    anastrozole  (ARIMIDEX ) 1 mg Oral Tablet    Take 1 Tablet (1 mg total) by mouth Daily    aspirin  81 mg Oral Tablet, Chewable    Chew 1 Tablet (81 mg total) Once a day    atorvastatin  (LIPITOR) 40 mg  Oral Tablet    Take 1 Tablet (40 mg total) by mouth Daily    cholecalciferol , vitamin D3, 25 mcg (1,000 unit) Oral Tablet    Take 1 Tablet (1,000 Units total) by mouth Daily    clonazePAM  (KLONOPIN ) 0.5 mg Oral Tablet    Take 1 Tablet (0.5 mg total) by mouth Twice per day as needed for Other (ANXIETY)    DULoxetine  (CYMBALTA  DR) 60 mg Oral Capsule, Delayed Release(E.C.)    Take 1 Capsule (60 mg total) by mouth Daily    fluticasone  propion-salmeteroL (ADVAIR ) 250-50 mcg/dose Inhalation oral diskus inhaler    Take 1 Puff (1 Inhalation total) by inhalation Twice daily    fluticasone  propionate (FLONASE ) 50 mcg/actuation Nasal Spray, Suspension    1 Spray Once per day as needed for Other (CONGESTION)    gabapentin  (NEURONTIN ) 400 mg Oral Capsule    Take 1 Capsule (400 mg total) by mouth Three times a day    losartan  (COZAAR ) 25 mg Oral Tablet    Take 1 Tablet (25 mg total) by mouth Daily    metoprolol  tartrate  (LOPRESSOR ) 25 mg Oral Tablet    Take 1 Tablet (25 mg total) by mouth Twice daily    OLANZapine  (ZYPREXA ) 5 mg Oral Tablet    Take 1 Tablet (5 mg total) by mouth Every night for 3 days    oxyCODONE -acetaminophen  (PERCOCET) 7.5-325 mg Oral Tablet    Take 1 Tablet by mouth Every 4 hours as needed for Pain    prochlorperazine  (COMPAZINE ) 10 mg Oral Tablet    Take 1 Tablet (10 mg total) by mouth Three times a day as needed for Nausea/Vomiting for up to 30 days    promethazine  (PHENERGAN ) 25 mg Oral Tablet    Take 1 Tablet (25 mg total) by mouth Every 8 hours as needed for Nausea/Vomiting    traZODone  (DESYREL ) 150 mg Oral Tablet    Take 1 Tablet (150 mg total) by mouth Every night          Allergies[1]  Social History     Socioeconomic History    Marital status: Widowed   Tobacco Use    Smoking status: Every Day     Current packs/day: 0.50     Average packs/day: 0.5 packs/day for 52.7 years (26.4 ttl pk-yrs)     Types: Cigarettes     Start date: 1973    Smokeless tobacco: Never   Vaping Use    Vaping status: Never Used   Substance and Sexual Activity    Alcohol use: Yes     Comment: occasional    Drug use: Yes     Types: Marijuana     Social Determinants of Health     Social Connections: Low Risk (06/14/2024)    Social Connections     SDOH Social Isolation: 5 or more times a week     Family Medical History:       Problem Relation (Age of Onset)    Arthritis Mother, Maternal Grandmother    Cancer Father, Other    Heart Attack Father, Other    Heart Disease Brother, Other    Hypertension (High Blood Pressure) Mother, Father, Other    Pacemaker Mother                 Vital Signs:  Temperature: 36.9 C (98.4 F)  Heart Rate: 79  BP (Non-Invasive): 137/86  Respiratory Rate: (!) 21  SpO2: 96 %        Physical  Exam  Constitutional:       Appearance: No distress  HENT:      Head: Normocephalic and atraumatic.      Mouth: Mucous membranes are moist     Eyes:PERRLA  Cardiovascular:      Rate and Rhythm: Normal rate and regular  rhythm.    Pulmonary:      Breath sounds: coarse, diminished  Abdominal:      General: Abdomen is soft, nontender  Skin:     General: Skin is dry.   Neurological:      Mental Status: Alert and oriented x3      Labs:  I have reviewed all lab results.  Lab Results Today:    Results for orders placed or performed during the hospital encounter of 07/08/24 (from the past 24 hours)   EXTENDED RESPIRATORY VIRUS PANEL    Specimen: Nasopharyngeal Swab   Result Value Ref Range    ADENOVIRUS ARRAY Not Detected Not Detected    CORONAVIRUS 229E Not Detected Not Detected    CORONAVIRUS HKU1 Not Detected Not Detected    CORONAVIRUS NL63 Not Detected Not Detected    CORONAVIRUS OC43 Not Detected Not Detected    SARS CORONAVIRUS 2 (SARS-CoV-2) Detected (A) Not Detected    METAPNEUMOVIRUS ARRAY Not Detected Not Detected    RHINOVIRUS/ENTEROVIRUS ARRAY Not Detected Not Detected    INFLUENZA A Not Detected Not Detected    INFLUENZA B ARRAY Not Detected Not Detected    PARAINFLUENZA 1 ARRAY Not Detected Not Detected    PARAINFLUENZA 2 ARRAY Not Detected Not Detected    PARAINFLUENZA 3 ARRAY Not Detected Not Detected    PARAINFLUENZA 4 ARRAY Not Detected Not Detected    RSV ARRAY Not Detected Not Detected    BORDETELLA PARAPERTUSSIS (IS 1001) Not Detected Not Detected    BORDETELLA PERTUSSIS ARRAY Not Detected Not Detected    CHLAMYDOPHILA PNEUMONIAE ARRAY Not Detected Not Detected    MYCOPLASMA PNEUMONIAE ARRAY Not Detected Not Detected   LDH   Result Value Ref Range    LDH >2,500 (H) 135 - 214 U/L   VITAMIN B12   Result Value Ref Range    VITAMIN B 12 1,106 232 - 1,245 pg/mL   FOLATE   Result Value Ref Range    FOLATE 3.5 (L) 4.6 - 34.8 ng/mL   FERRITIN   Result Value Ref Range    FERRITIN >2,000 (H) 13 - 150 ng/mL   IRON  TRANSFERRIN AND TIBC   Result Value Ref Range    IRON  (TRANSFERRIN) SATURATION 47 20 - 55 %    IRON  146 (H) 37 - 145 ug/dL    TOTAL IRON  BINDING CAPACITY 309 112 - 347 ug/dL    TRANSFERRIN 778 799 - 360 mg/dL    C-REACTIVE PROTEIN (CRP)   Result Value Ref Range    C-REACTIVE PROTEIN (CRP) 3.5 (H) 0.0 - 0.9 mg/dL   TRANSTHORACIC ECHOCARDIOGRAM - ADULT   Result Value Ref Range    EF VISUAL ESTIMATE 55     EF 60    BASIC METABOLIC PANEL, NON-FASTING   Result Value Ref Range    SODIUM 142 133 - 144 mmol/L    POTASSIUM 3.6 3.2 - 5.0 mmol/L    CHLORIDE 104 96 - 106 mmol/L    CO2 TOTAL 24 22 - 30 mmol/L    ANION GAP 14 7 - 18 mmol/L    CALCIUM 8.9 8.3 - 10.7 mg/dL    GLUCOSE 895 74 - 890 mg/dL  BUN 24 (H) 8 - 23 mg/dL    CREATININE 9.40 9.49 - 0.90 mg/dL    BUN/CREA RATIO 41     ESTIMATED GFR >90 >90 mL/min/1.45m^2   MAGNESIUM    Result Value Ref Range    MAGNESIUM  1.8 1.6 - 2.4 mg/dL   HEPATIC FUNCTION PANEL   Result Value Ref Range    ALBUMIN 2.9 (L) 3.5 - 5.2 g/dL    ALKALINE PHOSPHATASE 358 (H) 35 - 129 U/L    ALT (SGPT) 140 (H) 0 - 33 U/L    AST (SGOT) 376 (H) 0 - 32 U/L    BILIRUBIN TOTAL 0.5 0.2 - 1.2 mg/dL    BILIRUBIN DIRECT 0.3 0.0 - 0.3 mg/dL    PROTEIN TOTAL 5.7 (L) 6.4 - 8.3 g/dL   C-REACTIVE PROTEIN(CRP),INFLAMMATION   Result Value Ref Range    C-REACTIVE PROTEIN (CRP) 4.2 (H) 0.0 - 0.9 mg/dL   CBC WITH DIFF   Result Value Ref Range    WBC 7.0 3.7 - 11.0 x10^3/uL    RBC 2.77 (L) 3.85 - 5.22 x10^6/uL    HGB 7.7 (L) 11.5 - 16.0 g/dL    HCT 74.3 (L) 65.1 - 46.0 %    MCV 92.4 78.0 - 100.0 fL    MCH 27.8 26.0 - 32.0 pg    MCHC 30.1 (L) 31.0 - 35.5 g/dL    RDW-CV 85.1 88.4 - 84.4 %    PLATELETS 52 (L) 150 - 400 x10^3/uL    MPV 10.8 8.7 - 12.5 fL   MANUAL DIFF AND MORPHOLOGY-SYSMEX   Result Value Ref Range    NEUTROPHIL % 61 %    LYMPHOCYTE %  33 %    MONOCYTE % 2 %    EOSINOPHIL % 1 %    BASOPHIL % 0 %    NEUTROPHIL BANDS % 3   %    NEUTROPHIL # 4.48 1.50 - 7.70 x10^3/uL    LYMPHOCYTE # 2.31 1.00 - 4.80 x10^3/uL    MONOCYTE # 0.14 (L) 0.20 - 1.10 x10^3/uL    EOSINOPHIL # <0.10 <=0.50 x10^3/uL    BASOPHIL # <0.10 <=0.20 x10^3/uL    NRBC FROM MANUAL DIFF 6 (H) <=1 per 100 WBC    ROULEAUX Present (A) None   PATH COMMENT    Result Value Ref Range    PATHOLOGIST INTERPRETATION Abnormal Study, See Interpretation (A) Normal Study   LDH   Result Value Ref Range    LDH >2,500 (H) 135 - 214 U/L       Radiology Results:   Results for orders placed or performed during the hospital encounter of 07/08/24 (from the past 24 hours)   CT ABDOMEN PELVIS W IV CONTRAST     Status: None    Narrative    Oriya JEAN Soy    CT ABDOMEN PELVIS W IV CONTRAST performed on 07/08/2024 1:18 PM.    INDICATION:  abd pain   Additional History:  abdomen pain, nausea, vomiting    TECHNIQUE:  CT abdomen and pelvis with axial, coronal, and sagittal multiplanar reformations, performed with contrast. Dose modulation, automated exposure control, and/or iterative reconstruction were used for dose reduction.    CONTRAST:  100 mL Isovue  370 IV    COMPARISON:  Abdomen CT 06/14/2024  ___________________________________  FINDINGS:    LOWER THORAX: Unremarkable.    HEPATOBILIARY:  Hepatomegaly and hepatic steatosis. Scattered hypodense lesions in the liver redemonstrated not greatly changed and the short-term interval.  Pericholecystic fluid now present, gallbladder slightly  contracted.  No intrahepatic or extrahepatic ductal dilation.  PANCREAS: No focal masses or ductal dilatation.  SPLEEN: No splenomegaly.    ADRENALS: No adrenal nodules.  KIDNEYS/URETERS/BLADDER: No hydronephrosis,. Bilateral renal cyst and multiple bilateral renal calculi redemonstrated.  REPRODUCTIVE: No significant findings.    PERITONEUM / RETROPERITONEUM: New free fluid in the dependent pelvis.  LYMPH NODES: No lymphadenopathy. Small retroperitoneal are unchanged. Lymph nodes  VESSELS: Abdominal aorta is normal in caliber. Severe atherosclerotic disease aorta.    GI TRACT: No distention, wall thickening, or inflammatory stranding.  Appendix is normal. Colon diverticulosis. ABDOMINAL WALL:  Intact.    SOFT TISSUES: Unremarkable.  BONES: Degenerative changes are present in the  spine  ___________________________________    Impression    1. Hepatomegaly, hepatic steatosis and hepatic lesions, not greatly changed and new pericholecystic fluid now present.  2. Multiple nonobstructing bilateral renal calculi.  3. New free fluid in the pelvis.      Radiologist location ID: [id number]     CT ANGIO CHEST FOR PULMONARY EMBOLUS W IV CONTRAST     Status: None    Narrative    Jayleana JEAN Tamas    CT ANGIO CHEST FOR PULMONARY EMBOLUS W IV CONTRAST performed on 07/08/2024 4:34 PM.    INDICATION:  PE   Additional History:  Dyspnea, chest pain, history of small cell lung cancer, coronary stent, and CABG.    TECHNIQUE:  CT angiography of the chest performed for evaluation of the pulmonary arteries, with axial, sagittal, and coronal multiplanar reformations and a coronal 3D MIP.  Dose modulation, automated exposure control, and/or iterative reconstruction were used for dose reduction.    CONTRAST: 75 mL of Isovue  370 IV    COMPARISON:  June 14, 2024  ___________________________________  FINDINGS:    HEART / GREAT VESSELS: There are no filling defects in the pulmonary arteries to suggest a pulmonary embolism.  The heart is normal in size without pericardial effusion.  The thoracic aorta is unremarkable.     LUNGS / AIRWAYS: Focal area of scarring is noted in the right upper lobe. No significant interval change compared to the prior study. Right infrahilar masslike lesion is again noted, demonstrates marginal increase in size compared to the prior study now measuring approximately 3.8 x 3.3 cm. Interlobular emphysematous changes are noted bilaterally. No lobar consolidation, pleural effusions or pneumothorax.  There is narrowing of the right mainstem and right segmental and proximal subsegmental bronchioles.. There are no suspicious pulmonary nodules.    MEDIASTINUM: There is right supraclavicular lymphadenopathy, measuring 2.2 cm, marginally increased in size compared to the prior study. Few scattered  right supraclavicular lymph nodes are unchanged. Marginal increase in the size of the right paratracheal conglomerate lymph nodal mass now measuring 2.3 cm (previously 1.9 cm. Marginal increase in the right hilar and infrahilar lymphadenopathy. The subcarinal lymph node measures 3 cm.    UPPER ABDOMEN: Few scattered left para-aortic lymph nodes are noted, not significant by size criteria. These are partially visualized on the current study. Previously noted liver lesions are not well identified on the current study due to the phase of IV contrast.    BONES:  ACDF of the lower cervical spine. Median sternotomy and mediastinal clips are noted.    OTHER:  The visualized thyroid gland is unremarkable.  ___________________________________    Impression    1. No evidence of pulmonary embolism.  2. Marginal increase in the size of the right infrahilar mass, right hilar and mediastinal lymphadenopathy compared to the  prior study.  3. Other chronic findings as described above.        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  Patient was found to have COVID-19 during this admission.  GI consulted to rule out GI bleed.  No significant iron -deficiency noted and patient denies overt melena or hematochezia.  Hematology following, note reviewed and could be related to acute illness or metastasis.  Patient would be high risk for endoscopy at this time given her acute pulmonary issues.  Would monitor for overt GI bleeding and sent for bleeding scan if any evidence of bleeding.   Will add PPI if not yet done in check H pylori.  Also noted new elevation of LFTs, which could be related to hepatic metastasis or viral process/COVID-19, will check INR, acute hepatitis panel and right upper quadrant ultrasound given pericholecystic fluid seen on CT scan.  Please notify GI of any brisk GI bleeding.  GI will be available as needed.      I independently of the attending provider spent a total of (55) minutes in direct/indirect care of this patient including initial evaluation, review of laboratory, radiology, diagnostic studies, review of medical record, order entry and coordination of care.   Alan BIRCH Segsworth, FNP-BC      I have seen and examined the patient personally and in concert with the Alan BIRCH Castles, FNP-BC as noted above.  The above note has been modified as necessary by me to reflect my own, personal collection/validation of the patient's HPI, patient history, physical examination, and assessment and plan.  Patient has a known history of recently diagnosed small-cell lung cancer with metastasis to the liver who presents to the hospital from infusion center due to hypoxia.  Patient was found to have COVID-19 during this admission.  GI consulted to rule out GI bleed.  No significant iron -deficiency noted and patient denies overt melena or hematochezia.  Hematology following, note reviewed and could be related to acute illness or metastasis.  Patient would be high risk for endoscopy at this time given her acute pulmonary issues.  Would monitor for overt GI bleeding and sent for bleeding scan if any evidence of bleeding.  Will add PPI if not yet done in check H pylori.  Also noted new elevation of LFTs, which could be related to hepatic metastasis or viral process/COVID-19, will check INR, acute hepatitis panel and right upper quadrant ultrasound given pericholecystic fluid seen on CT scan.  Please notify GI of any brisk GI bleeding.  GI will be available as needed.    Gayleen Sholtz,  MD  GI  Duration of visit 55 minutes         [1] No Known Allergies

## 2024-07-09 NOTE — Care Plan (Signed)
 Problem: Wound  Goal: Optimal Coping  Outcome: Ongoing (see interventions/notes)  Intervention: Support Patient and Family Response  Recent Flowsheet Documentation  Taken 07/09/2024 1750 by Cameron DASEN, RN  Family/Support System Care: self-care encouraged  Supportive Measures:   active listening utilized   goal-setting facilitated   journaling promoted   positive reinforcement provided   problem-solving facilitated  Goal: Optimal Functional Ability  Outcome: Ongoing (see interventions/notes)  Intervention: Optimize Functional Ability  Recent Flowsheet Documentation  Taken 07/09/2024 1750 by Cameron DASEN, RN  Activity Management: ambulated in room  Activity Assistance Provided: independent  Goal: Absence of Infection Signs and Symptoms  Outcome: Ongoing (see interventions/notes)  Intervention: Prevent or Manage Infection  Recent Flowsheet Documentation  Taken 07/09/2024 1750 by Cameron T, RN  Fever Reduction/Comfort Measures:   lightweight bedding   lightweight clothing  Goal: Improved Oral Intake  Outcome: Ongoing (see interventions/notes)  Goal: Optimal Pain Control and Function  Outcome: Ongoing (see interventions/notes)  Goal: Skin Health and Integrity  Outcome: Ongoing (see interventions/notes)  Intervention: Optimize Skin Protection  Recent Flowsheet Documentation  Taken 07/09/2024 1750 by Cameron T, RN  Pressure Reduction Techniques: Mobility is maximized  Activity Management: ambulated in room  Head of Bed (HOB) Positioning: HOB elevated  Goal: Optimal Wound Healing  Outcome: Ongoing (see interventions/notes)  Intervention: Promote Wound Healing  Recent Flowsheet Documentation  Taken 07/09/2024 1750 by Cameron T, RN  Pressure Reduction Techniques: Mobility is maximized  Activity Management: ambulated in room     Problem: Adult Inpatient Plan of Care  Goal: Plan of Care Review  Outcome: Ongoing (see interventions/notes)  Goal: Patient-Specific Goal (Individualized)  Outcome: Ongoing (see interventions/notes)  Flowsheets  (Taken 07/09/2024 1700)  Individualized Care Needs: keep informed  Anxieties, Fears or Concerns: none at this time  Goal: Absence of Hospital-Acquired Illness or Injury  Outcome: Ongoing (see interventions/notes)  Intervention: Identify and Manage Fall Risk  Recent Flowsheet Documentation  Taken 07/09/2024 1750 by Cameron DASEN, RN  Safety Promotion/Fall Prevention:   activity supervised   fall prevention program maintained   nonskid shoes/slippers when out of bed   safety round/check completed   toileting scheduled  Intervention: Prevent Skin Injury  Recent Flowsheet Documentation  Taken 07/09/2024 1750 by Cameron DASEN, RN  Body Position: sitting  Goal: Optimal Comfort and Wellbeing  Outcome: Ongoing (see interventions/notes)  Intervention: Provide Person-Centered Care  Recent Flowsheet Documentation  Taken 07/09/2024 1750 by Cameron DASEN, RN  Trust Relationship/Rapport:   care explained   emotional support provided   questions encouraged   thoughts/feelings acknowledged   reassurance provided   questions answered  Goal: Rounds/Family Conference  Outcome: Ongoing (see interventions/notes)

## 2024-07-09 NOTE — ED Nurses Note (Signed)
Transport requested at this time.

## 2024-07-09 NOTE — Assessment & Plan Note (Addendum)
 Acute hypoxic respiratory failure currently on 2 L nasal cannula.  Significantly desaturating when ambulating   CT angiogram of the chest that has not show any evidence pulmonary embolism although marginal increase in the size of infrahilar mass, right hilar and mediastinal lymphadenopathy.  Chest x-ray showed vague opacities in the that could possibly be mild pneumoniae was atelectasis   Empirically being treated steroids and antibiotics for community-acquired pneumonia  Viral panel positive for COVID   . We'll get a Hemoccult card as well on the possibility that this is truly anemia and monitor hemoglobins. Of note, this could also be worsening progression of the patient's known small cell carcinoma of the lungs. We'll consult the primary oncologist

## 2024-07-09 NOTE — ED Nurses Note (Signed)
 All pyxis systems in ED found to be out Lipitor at this time. Message sent to pharmacy for it to be delivered to ED.

## 2024-07-09 NOTE — Progress Notes (Signed)
 [facility]  Progress Note    [name], [name], 68 y.o. female  Date of Birth:  05-13-1956  Encounter Start Date:  07/08/2024  Inpatient Admission Date: 07/08/2024  Date of service: 07/09/2024       Interim Update  Patient was seen resting comfortably at bedside.  Reports stable dyspnea although that has noted that patient was more short of breath during ambulation today to the bathroom..    acetaminophen  (TYLENOL ) tablet, 650 mg, Oral, Q4H PRN  aluminum-magnesium  hydroxide-simethicone (MAG-AL PLUS) 200-200-20 mg per 5 mL oral liquid, 30 mL, Oral, Q4H PRN  anastrozole  (ARIMIDEX ) tablet, 1 mg, Oral, Daily  budesonide (PULMICORT RESPULES) 0.5 mg/2 mL nebulizer suspension, 1 mg, Nebulization, 2x/day   And  arformoterol (BROVANA) 15 mcg/2 mL nebulizer solution, 15 mcg, Nebulization, 2x/day  aspirin  chewable tablet 81 mg, 81 mg, Oral, Daily  atorvastatin  (LIPITOR) tablet, 40 mg, Oral, Daily  azithromycin (ZITHROMAX) tablet, 250 mg, Oral, Daily  cefTRIAXone (ROCEPHIN) 2 g in NS 50 mL IVPB with adaptor, 2 g, Intravenous, Q24H  cholecalciferol  (VITAMIN D3) 1000 unit (25 mcg) tablet, 1,000 Units, Oral, Daily  clonazePAM  (klonoPIN ) tablet, 0.5 mg, Oral, 2x/day PRN  NS 250 mL flush bag, , Intravenous, Q15 Min PRN   And  D5W 250 mL flush bag, , Intravenous, Q15 Min PRN  diphenhydrAMINE (BENADRYL) capsule, 25 mg, Oral, HS PRN  DULoxetine  (CYMBALTA ) delayed release capsule, 60 mg, Oral, Daily  enoxaparin PF (LOVENOX) 40 mg/0.4 mL SubQ injection, 40 mg, Subcutaneous, Q24H  fluticasone  (FLONASE ) 50 mcg per spray nasal spray, 1 Spray, Each Nostril, Daily PRN  gabapentin  (NEURONTIN ) capsule, 400 mg, Oral, 3x/day  ipratropium-albuterol  0.5 mg-3 mg(2.5 mg base)/3 mL Solution for Nebulization, 3 mL, Nebulization, Q4H PRN  losartan  (COZAAR ) tablet, 25 mg, Oral, Daily  magnesium  hydroxide (MILK OF MAGNESIA) 400mg  per 5mL oral liquid, 15 mL, Oral, 4x/day PRN  metoprolol  tartrate (LOPRESSOR ) tablet, 25 mg, Oral,  2x/day  multivitamin-minerals-iron  oral liquid, 15 mL, Oral, Daily  NS flush syringe, 3 mL, Intracatheter, Q8HRS  NS flush syringe, 3 mL, Intracatheter, Q1H PRN  OLANZapine  (zyPREXA ) tablet, 5 mg, Oral, NIGHTLY  omega-3 fatty acids (LOVAZA ) capsule, 1 g, Oral, Daily  ondansetron  (ZOFRAN ) 2 mg/mL injection, 4 mg, Intravenous, Q6H PRN  pantoprazole  (PROTONIX ) delayed release tablet, 40 mg, Oral, Daily  polyethylene glycol (MIRALAX) oral packet, 17 g, Oral, Daily  prochlorperazine  (COMPAZINE ) tablet, 10 mg, Oral, 3x/day PRN  traZODone  (DESYREL ) tablet, 150 mg, Oral, NIGHTLY            Vital Signs:    Filed Vitals:    07/09/24 0747 07/09/24 0800 07/09/24 0827 07/09/24 0828   BP:  (!) 152/92 (!) 152/92 (!) 152/92   Pulse:  88  95   Resp: 16 (!) 21     Temp:       SpO2:  96%        Body mass index is 25.89 kg/m.    Physical Exam:  General: 68 year old, acute and chronically ill-appearing female, in no acute distress.   Respiratory: Diminished breath sounds bilaterally with wheezing. No cyanosis or tachypnea on 2 L and CFM of evaluation.   Cardiovascular: RRR, notable murmur, lymphedema noted without pitting edema.   GI: Abdmen with some tenderness, more so in bilateral lower quadrants. Slightly firm.   Neurologic: Responds to questions and commands appropriately, but in the context of being a poor historian.    Studies:  I have reviewed all available studies within the electronic medical record.  Results for orders placed or performed during the hospital encounter of 07/08/24 (from the past 24 hours)   ECG 12 LEAD   Result Value Ref Range    Ventricular rate 80 BPM    Atrial Rate 80 BPM    PR Interval 124 ms    QRS Duration 84 ms    QT Interval 402 ms    QTC Calculation 463 ms    Calculated P Axis 55 degrees    Calculated R Axis 25 degrees    Calculated T Axis 60 degrees   BNP   Result Value Ref Range    NT-PROBNP 1,476 (H) 0 - 125 pg/mL   COMPREHENSIVE METABOLIC PANEL, NON-FASTING   Result Value Ref Range    SODIUM  139 133 - 144 mmol/L    POTASSIUM 3.8 3.2 - 5.0 mmol/L    CHLORIDE 100 96 - 106 mmol/L    CO2 TOTAL 22 22 - 30 mmol/L    ANION GAP 17 7 - 18 mmol/L    BUN 30 (H) 8 - 23 mg/dL    CREATININE 9.18 9.49 - 0.90 mg/dL    ESTIMATED GFR 80 (L) >90 mL/min/1.59m^2    ALBUMIN 3.5 3.5 - 5.2 g/dL    CALCIUM 9.6 8.3 - 89.2 mg/dL    GLUCOSE 787 (H) 74 - 109 mg/dL    ALKALINE PHOSPHATASE 342 (H) 35 - 129 U/L    ALT (SGPT) 135 (H) 0 - 33 U/L    AST (SGOT) 293 (H) 0 - 32 U/L    BILIRUBIN TOTAL 0.5 0.2 - 1.2 mg/dL    PROTEIN TOTAL 6.6 6.4 - 8.3 g/dL   TROPONIN-T NOW   Result Value Ref Range    TROPONIN-T 23 (H) <=14 ng/L   CBC WITH DIFF   Result Value Ref Range    WBC 7.5 3.7 - 11.0 x10^3/uL    RBC 3.38 (L) 3.85 - 5.22 x10^6/uL    HGB 9.3 (L) 11.5 - 16.0 g/dL    HCT 69.4 (L) 65.1 - 46.0 %    MCV 90.2 78.0 - 100.0 fL    MCH 27.5 26.0 - 32.0 pg    MCHC 30.5 (L) 31.0 - 35.5 g/dL    RDW-CV 85.3 88.4 - 84.4 %    PLATELETS 74 (L) 150 - 400 x10^3/uL   MANUAL DIFF AND MORPHOLOGY-SYSMEX   Result Value Ref Range    NEUTROPHIL % 75 %    LYMPHOCYTE %  17 %    MONOCYTE % 4 %    EOSINOPHIL % 0 %    BASOPHIL % 0 %    NEUTROPHIL BANDS % 3   %    REACTIVE LYMPHOCYTE % 1 %    NEUTROPHIL # 5.85 1.50 - 7.70 x10^3/uL    LYMPHOCYTE # 1.35 1.00 - 4.80 x10^3/uL    MONOCYTE # 0.30 0.20 - 1.10 x10^3/uL    EOSINOPHIL # <0.10 <=0.50 x10^3/uL    BASOPHIL # <0.10 <=0.20 x10^3/uL    NRBC FROM MANUAL DIFF 7 (H) <=1 per 100 WBC   RETICULOCYTE COUNT   Result Value Ref Range    RETICULOCYTE % AUTOMATED 1.63 0.50 - 2.20 %    RETICULOCYTES COUNT # AUTOMATED 55.1 17.0 - 100.0 x10^3/uL    IMMATURE RETIC FRACTION 23.9 (H) 2.7 - 15.9 %    RETICULOCYTE HEMOGLOBIN EQUIVALENT 27.9 (L) 28.0 - 38.0 pg   SEDIMENTATION RATE   Result Value Ref Range    ERYTHROCYTE SEDIMENTATION RATE (ESR) 109 (H) 0 -  20 mm/hr   TROPONIN-T IN TWO HOURS   Result Value Ref Range    TROPONIN-T 21 (H) <=14 ng/L   EXTENDED RESPIRATORY VIRUS PANEL    Specimen: Nasopharyngeal Swab   Result Value Ref Range     ADENOVIRUS ARRAY Not Detected Not Detected    CORONAVIRUS 229E Not Detected Not Detected    CORONAVIRUS HKU1 Not Detected Not Detected    CORONAVIRUS NL63 Not Detected Not Detected    CORONAVIRUS OC43 Not Detected Not Detected    SARS CORONAVIRUS 2 (SARS-CoV-2) Detected (A) Not Detected    METAPNEUMOVIRUS ARRAY Not Detected Not Detected    RHINOVIRUS/ENTEROVIRUS ARRAY Not Detected Not Detected    INFLUENZA A Not Detected Not Detected    INFLUENZA B ARRAY Not Detected Not Detected    PARAINFLUENZA 1 ARRAY Not Detected Not Detected    PARAINFLUENZA 2 ARRAY Not Detected Not Detected    PARAINFLUENZA 3 ARRAY Not Detected Not Detected    PARAINFLUENZA 4 ARRAY Not Detected Not Detected    RSV ARRAY Not Detected Not Detected    BORDETELLA PARAPERTUSSIS (IS 1001) Not Detected Not Detected    BORDETELLA PERTUSSIS ARRAY Not Detected Not Detected    CHLAMYDOPHILA PNEUMONIAE ARRAY Not Detected Not Detected    MYCOPLASMA PNEUMONIAE ARRAY Not Detected Not Detected   LDH   Result Value Ref Range    LDH >2,500 (H) 135 - 214 U/L   VITAMIN B12   Result Value Ref Range    VITAMIN B 12 1,106 232 - 1,245 pg/mL   FOLATE   Result Value Ref Range    FOLATE 3.5 (L) 4.6 - 34.8 ng/mL   FERRITIN   Result Value Ref Range    FERRITIN >2,000 (H) 13 - 150 ng/mL   IRON  TRANSFERRIN AND TIBC   Result Value Ref Range    IRON  (TRANSFERRIN) SATURATION 47 20 - 55 %    IRON  146 (H) 37 - 145 ug/dL    TOTAL IRON  BINDING CAPACITY 309 112 - 347 ug/dL    TRANSFERRIN 778 799 - 360 mg/dL   C-REACTIVE PROTEIN (CRP)   Result Value Ref Range    C-REACTIVE PROTEIN (CRP) 3.5 (H) 0.0 - 0.9 mg/dL   BASIC METABOLIC PANEL, NON-FASTING   Result Value Ref Range    SODIUM 142 133 - 144 mmol/L    POTASSIUM 3.6 3.2 - 5.0 mmol/L    CHLORIDE 104 96 - 106 mmol/L    CO2 TOTAL 24 22 - 30 mmol/L    ANION GAP 14 7 - 18 mmol/L    CALCIUM 8.9 8.3 - 10.7 mg/dL    GLUCOSE 895 74 - 890 mg/dL    BUN 24 (H) 8 - 23 mg/dL    CREATININE 9.40 9.49 - 0.90 mg/dL    BUN/CREA RATIO 41      ESTIMATED GFR >90 >90 mL/min/1.22m^2   MAGNESIUM    Result Value Ref Range    MAGNESIUM  1.8 1.6 - 2.4 mg/dL   HEPATIC FUNCTION PANEL   Result Value Ref Range    ALBUMIN 2.9 (L) 3.5 - 5.2 g/dL    ALKALINE PHOSPHATASE 358 (H) 35 - 129 U/L    ALT (SGPT) 140 (H) 0 - 33 U/L    AST (SGOT) 376 (H) 0 - 32 U/L    BILIRUBIN TOTAL 0.5 0.2 - 1.2 mg/dL    BILIRUBIN DIRECT 0.3 0.0 - 0.3 mg/dL    PROTEIN TOTAL 5.7 (L) 6.4 - 8.3 g/dL   C-REACTIVE PROTEIN(CRP),INFLAMMATION  Result Value Ref Range    C-REACTIVE PROTEIN (CRP) 4.2 (H) 0.0 - 0.9 mg/dL   CBC WITH DIFF   Result Value Ref Range    WBC 7.0 3.7 - 11.0 x10^3/uL    RBC 2.77 (L) 3.85 - 5.22 x10^6/uL    HGB 7.7 (L) 11.5 - 16.0 g/dL    HCT 74.3 (L) 65.1 - 46.0 %    MCV 92.4 78.0 - 100.0 fL    MCH 27.8 26.0 - 32.0 pg    MCHC 30.1 (L) 31.0 - 35.5 g/dL    RDW-CV 85.1 88.4 - 84.4 %    PLATELETS 52 (L) 150 - 400 x10^3/uL    MPV 10.8 8.7 - 12.5 fL   MANUAL DIFF AND MORPHOLOGY-SYSMEX   Result Value Ref Range    NEUTROPHIL % 61 %    LYMPHOCYTE %  33 %    MONOCYTE % 2 %    EOSINOPHIL % 1 %    BASOPHIL % 0 %    NEUTROPHIL BANDS % 3   %    NEUTROPHIL # 4.48 1.50 - 7.70 x10^3/uL    LYMPHOCYTE # 2.31 1.00 - 4.80 x10^3/uL    MONOCYTE # 0.14 (L) 0.20 - 1.10 x10^3/uL    EOSINOPHIL # <0.10 <=0.50 x10^3/uL    BASOPHIL # <0.10 <=0.20 x10^3/uL    NRBC FROM MANUAL DIFF 6 (H) <=1 per 100 WBC    ROULEAUX Present (A) None     No results found. .    Assessment and Plan:  Assessment & Plan  Acute hypoxic respiratory failure (CMS HCC)  Acute hypoxic respiratory failure currently on 2 L nasal cannula.  Significantly desaturating when ambulating   CT angiogram of the chest that has not show any evidence pulmonary embolism although marginal increase in the size of infrahilar mass, right hilar and mediastinal lymphadenopathy.  Chest x-ray showed vague opacities in the that could possibly be mild pneumoniae was atelectasis   Empirically being treated steroids and antibiotics for community-acquired  pneumonia  Viral panel positive for COVID   . We'll get a Hemoccult card as well on the possibility that this is truly anemia and monitor hemoglobins. Of note, this could also be worsening progression of the patient's known small cell carcinoma of the lungs. 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  Transfuse for hemoglobin less than 7  Transaminitis  Likely secondary to liver metastasis  Pulmonary HTN (CMS HCC)  Consult pulmonology  Hypertension  Continue losartan , metoprolol   Mood disorder (CMS HCC)  Continue trazodone   Paroxysmal atrial fibrillation (CMS HCC)  Rate controlled, continue metoprolol , not on anticoagulation  CAD (coronary artery disease)  History of CABG x1   Continue aspirin , atorvastatin , metoprolol   No active anginal symptoms  Nikiya Starn R Edouard Gikas, MD

## 2024-07-09 NOTE — Assessment & Plan Note (Signed)
 Likely secondary to liver metastasis

## 2024-07-09 NOTE — ED Nurses Note (Signed)
 Patient's phone charger found in ED 26. Sent to 4P for patient via tube system at this time

## 2024-07-09 NOTE — ED Nurses Note (Signed)
 Patient with PureWick in place.

## 2024-07-09 NOTE — ED Nurses Note (Signed)
 Dr. Vaidyanathan at bedside at this time.

## 2024-07-09 NOTE — Assessment & Plan Note (Signed)
 Continue trazodone

## 2024-07-09 NOTE — ED Nurses Note (Signed)
 Transport staff present at this time to take patient to ultrasound

## 2024-07-09 NOTE — ED Nurses Note (Signed)
 Morning labs have not yet been obtained. Contacted phlebotomy with concerns of delay. Phlebotomy reports they will come drawn them now.

## 2024-07-10 ENCOUNTER — Ambulatory Visit (HOSPITAL_BASED_OUTPATIENT_CLINIC_OR_DEPARTMENT_OTHER): Payer: Self-pay

## 2024-07-10 ENCOUNTER — Inpatient Hospital Stay (HOSPITAL_COMMUNITY)

## 2024-07-10 DIAGNOSIS — J440 Chronic obstructive pulmonary disease with acute lower respiratory infection: Secondary | ICD-10-CM

## 2024-07-10 DIAGNOSIS — Z8505 Personal history of malignant neoplasm of liver: Secondary | ICD-10-CM

## 2024-07-10 DIAGNOSIS — K8000 Calculus of gallbladder with acute cholecystitis without obstruction: Secondary | ICD-10-CM

## 2024-07-10 DIAGNOSIS — Z853 Personal history of malignant neoplasm of breast: Secondary | ICD-10-CM

## 2024-07-10 DIAGNOSIS — K810 Acute cholecystitis: Secondary | ICD-10-CM

## 2024-07-10 DIAGNOSIS — D649 Anemia, unspecified: Principal | ICD-10-CM

## 2024-07-10 DIAGNOSIS — K828 Other specified diseases of gallbladder: Secondary | ICD-10-CM

## 2024-07-10 DIAGNOSIS — K8020 Calculus of gallbladder without cholecystitis without obstruction: Secondary | ICD-10-CM

## 2024-07-10 DIAGNOSIS — J1282 Pneumonia due to coronavirus disease 2019: Secondary | ICD-10-CM

## 2024-07-10 DIAGNOSIS — R7989 Other specified abnormal findings of blood chemistry: Secondary | ICD-10-CM

## 2024-07-10 DIAGNOSIS — I272 Pulmonary hypertension, unspecified: Secondary | ICD-10-CM

## 2024-07-10 DIAGNOSIS — I25810 Atherosclerosis of coronary artery bypass graft(s) without angina pectoris: Secondary | ICD-10-CM

## 2024-07-10 DIAGNOSIS — R160 Hepatomegaly, not elsewhere classified: Secondary | ICD-10-CM

## 2024-07-10 LAB — BASIC METABOLIC PANEL
ANION GAP: 14 mmol/L (ref 7–18)
BUN/CREA RATIO: 40
BUN: 20 mg/dL (ref 8–23)
CALCIUM: 8.8 mg/dL (ref 8.3–10.7)
CHLORIDE: 105 mmol/L (ref 96–106)
CO2 TOTAL: 26 mmol/L (ref 22–30)
CREATININE: 0.5 mg/dL (ref 0.50–0.90)
ESTIMATED GFR: >90 mL/min/1.73mˆ2 (ref >90)
GLUCOSE: 100 mg/dL (ref 74–109)
POTASSIUM: 3.6 mmol/L (ref 3.2–5.0)
SODIUM: 145 mmol/L — ABNORMAL HIGH (ref 133–144)

## 2024-07-10 LAB — MANUAL DIFF AND MORPHOLOGY-SYSMEX
BASOPHIL #: <0.1 x10ˆ3/uL (ref <=0.20)
BASOPHIL %: 0 %
EOSINOPHIL #: <0.1 x10ˆ3/uL (ref <=0.50)
EOSINOPHIL %: 0 %
LYMPHOCYTE #: 1.51 x10ˆ3/uL (ref 1.00–4.80)
LYMPHOCYTE %: 18 %
MONOCYTE #: 0.36 x10ˆ3/uL (ref 0.20–1.10)
MONOCYTE %: 5 %
NEUTROPHIL #: 5.33 x10ˆ3/uL (ref 1.50–7.70)
NEUTROPHIL %: 72 %
NEUTROPHIL BANDS %: 2 %
NRBC FROM MANUAL DIFF: 3 /100{WBCs} — ABNORMAL HIGH (ref <=1)
REACTIVE LYMPHOCYTE %: 3 %

## 2024-07-10 LAB — STREP PNEUMONIAE AND LEGIONELLA ANTIGEN, URINE
LEGIONELLA ANTIGEN: NEGATIVE
S.PNEUMONIAE ANTIGEN: NEGATIVE

## 2024-07-10 LAB — HEPATIC FUNCTION PANEL
ALBUMIN: 2.9 g/dL — ABNORMAL LOW (ref 3.5–5.2)
ALKALINE PHOSPHATASE: 362 U/L — ABNORMAL HIGH (ref 35–129)
ALT (SGPT): 133 U/L — ABNORMAL HIGH (ref 0–33)
AST (SGOT): 331 U/L — ABNORMAL HIGH (ref 0–32)
BILIRUBIN DIRECT: 0.5 mg/dL — ABNORMAL HIGH (ref 0.0–0.3)
BILIRUBIN TOTAL: 0.8 mg/dL (ref 0.2–1.2)
PROTEIN TOTAL: 5.7 g/dL — ABNORMAL LOW (ref 6.4–8.3)

## 2024-07-10 LAB — HAPTOGLOBIN, SERUM: HAPTOGLOBLIN: 502 mg/dL — ABNORMAL HIGH (ref 30–200)

## 2024-07-10 LAB — CBC WITH DIFF
HCT: 24.8 % — ABNORMAL LOW (ref 34.8–46.0)
HGB: 7.6 g/dL — ABNORMAL LOW (ref 11.5–16.0)
MCH: 27.7 pg (ref 26.0–32.0)
MCHC: 30.6 g/dL — ABNORMAL LOW (ref 31.0–35.5)
MCV: 90.5 fL (ref 78.0–100.0)
MPV: 10.8 fL (ref 8.7–12.5)
PLATELETS: 49 x10ˆ3/uL — ABNORMAL LOW (ref 150–400)
RBC: 2.74 x10ˆ6/uL — ABNORMAL LOW (ref 3.85–5.22)
RDW-CV: 15.4 % (ref 11.5–15.5)
WBC: 7.2 x10ˆ3/uL (ref 3.7–11.0)

## 2024-07-10 LAB — PTT (PARTIAL THROMBOPLASTIN TIME): APTT: 30.9 s (ref 22.4–34.8)

## 2024-07-10 LAB — PT/INR
INR: 1.19 — ABNORMAL HIGH (ref 0.86–1.14)
PROTHROMBIN TIME: 15.5 s — ABNORMAL HIGH (ref 12.1–15.3)

## 2024-07-10 MED ORDER — FOLIC ACID 1 MG TABLET
1.0000 mg | ORAL_TABLET | Freq: Every day | ORAL | Status: DC
Start: 2024-07-10 — End: 2024-07-19
  Administered 2024-07-10: 1 mg via ORAL
  Administered 2024-07-11: 0 mg via ORAL
  Administered 2024-07-12 – 2024-07-19 (×8): 1 mg via ORAL
  Filled 2024-07-10 (×9): qty 1

## 2024-07-10 MED ORDER — ALBUTEROL SULFATE 2.5 MG/3 ML (0.083 %) SOLUTION FOR NEBULIZATION
2.5000 mg | INHALATION_SOLUTION | Freq: Four times a day (QID) | RESPIRATORY_TRACT | Status: DC
Start: 2024-07-10 — End: 2024-07-19
  Administered 2024-07-10 (×3): 0 mg via RESPIRATORY_TRACT
  Administered 2024-07-11: 2.5 mg via RESPIRATORY_TRACT
  Administered 2024-07-11: 0 mg via RESPIRATORY_TRACT
  Administered 2024-07-11: 2.5 mg via RESPIRATORY_TRACT
  Administered 2024-07-11: 0 mg via RESPIRATORY_TRACT
  Administered 2024-07-12 (×2): 2.5 mg via RESPIRATORY_TRACT
  Administered 2024-07-12 – 2024-07-13 (×3): 0 mg via RESPIRATORY_TRACT
  Administered 2024-07-13 (×2): 2.5 mg via RESPIRATORY_TRACT
  Administered 2024-07-13: 0 mg via RESPIRATORY_TRACT
  Administered 2024-07-14 (×2): 2.5 mg via RESPIRATORY_TRACT
  Administered 2024-07-14: 0 mg via RESPIRATORY_TRACT
  Administered 2024-07-14 – 2024-07-15 (×2): 2.5 mg via RESPIRATORY_TRACT
  Administered 2024-07-15: 0 mg via RESPIRATORY_TRACT
  Administered 2024-07-15: 2.5 mg via RESPIRATORY_TRACT
  Administered 2024-07-15: 0 mg via RESPIRATORY_TRACT
  Administered 2024-07-16: 2.5 mg via RESPIRATORY_TRACT
  Administered 2024-07-16 (×2): 0 mg via RESPIRATORY_TRACT
  Administered 2024-07-16 – 2024-07-17 (×2): 2.5 mg via RESPIRATORY_TRACT
  Administered 2024-07-17: 0 mg via RESPIRATORY_TRACT
  Administered 2024-07-17: 2.5 mg via RESPIRATORY_TRACT
  Administered 2024-07-17: 0 mg via RESPIRATORY_TRACT
  Administered 2024-07-18: 2.5 mg via RESPIRATORY_TRACT
  Administered 2024-07-18 (×2): 0 mg via RESPIRATORY_TRACT
  Administered 2024-07-18: 2.5 mg via RESPIRATORY_TRACT
  Administered 2024-07-19 (×2): 0 mg via RESPIRATORY_TRACT

## 2024-07-10 MED ORDER — INDOCYANINE GREEN 25 MG SOLUTION FOR INJECTION
3.0000 mL | Freq: Once | INTRAMUSCULAR | Status: DC
Start: 2024-07-11 — End: 2024-07-11
  Filled 2024-07-10: qty 7.5

## 2024-07-10 NOTE — Assessment & Plan Note (Signed)
 History of CABG x1   Continue aspirin , atorvastatin , metoprolol   No active anginal symptoms

## 2024-07-10 NOTE — Care Plan (Signed)
 Problem: Wound  Goal: Optimal Coping  Outcome: Ongoing (see interventions/notes)  Goal: Optimal Functional Ability  Outcome: Ongoing (see interventions/notes)  Goal: Absence of Infection Signs and Symptoms  Outcome: Ongoing (see interventions/notes)  Goal: Improved Oral Intake  Outcome: Ongoing (see interventions/notes)  Goal: Optimal Pain Control and Function  Outcome: Ongoing (see interventions/notes)  Goal: Skin Health and Integrity  Outcome: Ongoing (see interventions/notes)  Goal: Optimal Wound Healing  Outcome: Ongoing (see interventions/notes)     Problem: Adult Inpatient Plan of Care  Goal: Plan of Care Review  Outcome: Ongoing (see interventions/notes)  Goal: Patient-Specific Goal (Individualized)  Outcome: Ongoing (see interventions/notes)  Goal: Absence of Hospital-Acquired Illness or Injury  Outcome: Ongoing (see interventions/notes)  Goal: Optimal Comfort and Wellbeing  Outcome: Ongoing (see interventions/notes)  Goal: Rounds/Family Conference  Outcome: Ongoing (see interventions/notes)

## 2024-07-10 NOTE — Assessment & Plan Note (Signed)
 Known history of small-cell carcinoma, planned for outpatient chemotherapy with carboplatin/etoposide/tecenteriq  Consult Oncology

## 2024-07-10 NOTE — Consults (Addendum)
 [facility]   Consult  Note  [name], [name], 68 y.o. female  Date of Birth:  September 03, 1956  Encounter Start Date:  07/08/2024  Inpatient Admission Date: 07/08/2024  Date of service: 07/10/2024    Service: Pulmonary   Requesting MD: Dr. Vaidyanathan     Reason for consultation: Acute hypoxic respiratory failure, known patient       HPI:  Scottie Stanish is a 68 y.o. female w/recently diagnosed Small-cell carcinoma w/liver mets, COPD, current smoker - not on O2 at home, CABG. She was found to have lung mass on last admission and underwent bronchoscopy w/bx of station 7 which revealed the small cell carcinoma. She came to hospital from outpatient infusion center to start chemotherapy but had dyspnea and hypoxia and was sent to ER for further evaluation. She was found to have COVID. She reports she's been feeling bad for the last few weeks. CTA chest - no PE, marginal increase in size of right infrahilar mass, right hilar and mediastinal lymphadenopathy compared to prior study. CT A/P: hepatomegaly, hepatic steatosis, and hepatic lesions not much change, new pericholecystic fluid now present. We are consulted for acute hypoxic respiratory failure, known patient.     [name] currently on 2 L NC/93%.  She is awake and alert.  Feels breathing is doing okay at present time.  Has some mild cough.  No chest pain.  No fevers noted.  No N/V/D.  No associated symptoms or modifying factors.     Historical Data   Past Medical History:   Diagnosis Date    Abdominal pain     Cancer (CMS HCC)     Chest pain     Chronic lung disease     Coronary artery disease     Dyslipidemia     Essential hypertension     Heart disease     History of percutaneous coronary intervention     History of stress test     Mixed hyperlipidemia     Unspecified chronic bronchitis          Past Surgical History:   Procedure Laterality Date    CARDIAC CATHETERIZATION      CORONARY ANGIOPLASTY      CORONARY ARTERY STENT PLACEMENT       HX BACK SURGERY      HX CORONARY ARTERY BYPASS GRAFT      HX MASTECTOMY, SIMPLE Bilateral     HX TUBAL LIGATION      NECK SURGERY           Allergies[1]  Family History  Family Medical History:       Problem Relation (Age of Onset)    Arthritis Mother, Maternal Grandmother    Cancer Father, Other    Heart Attack Father, Other    Heart Disease Brother, Other    Hypertension (High Blood Pressure) Mother, Father, Other    Pacemaker Mother           Social History  Social History[2]         (Not in an outpatient encounter)      acetaminophen  (TYLENOL ) tablet, 650 mg, Oral, Q4H PRN  aluminum-magnesium  hydroxide-simethicone (MAG-AL PLUS) 200-200-20 mg per 5 mL oral liquid, 30 mL, Oral, Q4H PRN  anastrozole  (ARIMIDEX ) tablet, 1 mg, Oral, Daily  budesonide (PULMICORT RESPULES) 0.5 mg/2 mL nebulizer suspension, 1 mg, Nebulization, 2x/day   And  arformoterol (BROVANA) 15 mcg/2 mL nebulizer solution, 15 mcg, Nebulization, 2x/day  aspirin  chewable tablet 81 mg,  81 mg, Oral, Daily  atorvastatin  (LIPITOR) tablet, 40 mg, Oral, Daily  benzonatate (TESSALON) capsule, 100 mg, Oral, Q8H PRN  cefTRIAXone (ROCEPHIN) 2 g in NS 50 mL IVPB with adaptor, 2 g, Intravenous, Q24H  cholecalciferol  (VITAMIN D3) 1000 unit (25 mcg) tablet, 1,000 Units, Oral, Daily  clonazePAM  (klonoPIN ) tablet, 0.5 mg, Oral, 2x/day PRN  NS 250 mL flush bag, , Intravenous, Q15 Min PRN   And  D5W 250 mL flush bag, , Intravenous, Q15 Min PRN  dexAMETHasone  (DECADRON ) tablet 6 mg, 6 mg, Oral, Daily  diphenhydrAMINE (BENADRYL) capsule, 25 mg, Oral, HS PRN  DULoxetine  (CYMBALTA ) delayed release capsule, 60 mg, Oral, Daily  enoxaparin PF (LOVENOX) 40 mg/0.4 mL SubQ injection, 40 mg, Subcutaneous, Q24H  fluticasone  (FLONASE ) 50 mcg per spray nasal spray, 1 Spray, Each Nostril, Daily PRN  gabapentin  (NEURONTIN ) capsule, 400 mg, Oral, 3x/day  HYDROcodone -acetaminophen  (NORCO) 5-325 mg per tablet, 1 Tablet, Oral, Q6H PRN  ipratropium-albuterol  0.5 mg-3 mg(2.5 mg base)/3 mL  Solution for Nebulization, 3 mL, Nebulization, Q4H PRN  losartan  (COZAAR ) tablet, 25 mg, Oral, Daily  magnesium  hydroxide (MILK OF MAGNESIA) 400mg  per 5mL oral liquid, 15 mL, Oral, 4x/day PRN  metoprolol  tartrate (LOPRESSOR ) tablet, 25 mg, Oral, 2x/day  multivitamin-minerals-iron  oral liquid, 15 mL, Oral, Daily  NS flush syringe, 3 mL, Intracatheter, Q8HRS  NS flush syringe, 3 mL, Intracatheter, Q1H PRN  OLANZapine  (zyPREXA ) tablet, 5 mg, Oral, NIGHTLY  omega-3 fatty acids (LOVAZA ) capsule, 1 g, Oral, Daily  ondansetron  (ZOFRAN ) 2 mg/mL injection, 4 mg, Intravenous, Q6H PRN  pantoprazole  (PROTONIX ) delayed release tablet, 40 mg, Oral, Daily  polyethylene glycol (MIRALAX) oral packet, 17 g, Oral, Daily  prochlorperazine  (COMPAZINE ) tablet, 10 mg, Oral, 3x/day PRN  traZODone  (DESYREL ) tablet, 150 mg, Oral, NIGHTLY      Active Orders   Microbiology    FECAL OCCULT BLD (TRIPLE CARD)     Frequency: ONE TIME     Number of Occurrences: 1 Occurrences    RESPIRATORY CULTURE AND GRAM STAIN, AEROBIC     Frequency: ONE TIME     Number of Occurrences: 1 Occurrences    SPUTUM SCREEN     Frequency: Once     Number of Occurrences: 1 Occurrences   Imaging    MRI MRCP WO CONTRAST     Frequency: ONE TIME     Number of Occurrences: 1 Occurrences     Scheduling Instructions:      Isolation Status:  Enhanced Droplet         Lab    HELICOBACTER PYLORI AG, BY EIA     Frequency: ONE TIME     Number of Occurrences: 1 Occurrences   Diet    DIET NPO - NOW EXCEPT ALL MEDS WITH SIPS OF WATER      Frequency: All Meals     Number of Occurrences: 1 Occurrences   Nursing    ACTIVITY     Frequency: TID (1000,1400,2200)     Number of Occurrences: Until Specified    ACTIVITY     Frequency: UNTIL DISCONTINUED     Number of Occurrences: Until Specified    INTAKE AND OUTPUT Q4H     Frequency: Q4H     Number of Occurrences: Until Specified    Notify MD Vital Signs     Frequency: PRN     Number of Occurrences: Until Specified     Order Comments: MAP < 60       Notify MD Vital Signs     Frequency:  PRN     Number of Occurrences: Until Specified    NURSE TO ENTER SECONDARY ORDER Other - (specify in comments) (ECG FOR CHEST PAIN)     Frequency: PRN     Number of Occurrences: Until Specified    PT IS INTERMEDIATE RISK FOR VENOUS THROMBOEMBOLISM     Frequency: CONTINUOUS     Number of Occurrences: Until Specified    PULSE OXIMETRY CONTINUOUS     Frequency: CONTINUOUS     Number of Occurrences: Until Specified    Small Volume Medication Infusion Line Flush     Linked Order: And     Frequency: UNTIL DISCONTINUED     Number of Occurrences: Until Specified     Order Comments: All intermittent infusions/IVPBs will be run as a secondary infusion and must be flushed to ensure the patient receives the entire dose. Administer and document on the corresponding primary flush order that matches the base solution of the intermittent infusion/IVPB. An iso-osmotic solution is compatible and may be flushed with D5W or NS. Page the service pharmacist with any questions.      TELEMETRY MONITORING X 48H     Frequency: CONTINUOUS X 48 HRS     Number of Occurrences: 48 Hours    VITAL SIGNS  Q4H     Frequency: Q4H     Number of Occurrences: Until Specified    WEIGH PATIENT     Frequency: DAILY (0600)     Number of Occurrences: Until Specified    WEIGH PATIENT     Frequency: UPON ADMISSION     Number of Occurrences: 1 Occurrences   Code Status    FULL CODE: ATTEMPT RESUSCITATION / CPR     Frequency: CONTINUOUS     Number of Occurrences: Until Specified     Order Comments: Patient wishes for full ICU level care including advanced airway interventions / mechanical ventilation.     In the event of pulseless cardiac arrest, patient consents to ACLS (advanced cardiac life support) to attempt resuscitation.  IE - Consents to chest compressions, life support including intubation, mechanical ventilation, defibrillation/cardioversion as indicated.         Consult    IP CONSULT TO GASTROENTEROLOGY OZDEN,  NURI     Frequency: ONE TIME     Number of Occurrences: 1 Occurrences    IP CONSULT TO GENERAL SURGERY LUCENTE, FRANK C     Frequency: ONE TIME     Number of Occurrences: 1 Occurrences    IP CONSULT TO ONCOLOGY PONUGUPATI, JOHN     Frequency: ONE TIME     Number of Occurrences: 1 Occurrences    IP CONSULT TO PULMONOLOGY WADE, ELSIE BLUNT     Frequency: ONE TIME     Number of Occurrences: 1 Occurrences   Isolation    ENHANCED DROPLET ISOLATION     Frequency: CONTINUOUS     Number of Occurrences: Until Specified     Order Comments: Private room  Wear a fitted N95/MSA/CAPR when entering room  Wear gown, gloves, and eye protection as indicated  Prior to transport, notify receiving department of precautions  Prior to transport, ensure patient is wearing a mask and follows Respiratory Hygiene/Cough Etiquette         Respiratory Care    IP CONSULT TO RESPIRATORY THERAPY     Frequency: ONE TIME     Number of Occurrences: 1 Occurrences    OXYGEN WEANING PARAMETERS     Frequency: UNTIL DISCONTINUED  Number of Occurrences: Until Specified   IV    INSERT & MAINTAIN PERIPHERAL IV ACCESS     Frequency: UNTIL DISCONTINUED     Number of Occurrences: Until Specified    PERIPHERAL IV DRESSING CHANGE     Frequency: PRN     Number of Occurrences: Until Specified   Precaution    FALL PRECAUTION     Frequency: CONTINUOUS     Number of Occurrences: Until Specified   Case Request    CASE REQUEST SURGICAL: ULTRASOUND ENDOBRONCHIAL     Frequency: ONE TIME     Number of Occurrences: 1 Occurrences   Medications    acetaminophen  (TYLENOL ) tablet     Frequency: Q4H PRN     Dose: 650 mg     Route: Oral    aluminum-magnesium  hydroxide-simethicone (MAG-AL PLUS) 200-200-20 mg per 5 mL oral liquid     Frequency: Q4H PRN     Dose: 30 mL     Route: Oral    anastrozole  (ARIMIDEX ) tablet     Frequency: Daily     Dose: 1 mg     Route: Oral    arformoterol (BROVANA) 15 mcg/2 mL nebulizer solution     Linked Order: And     Frequency: 2x/day      Dose: 15 mcg     Route: Nebulization    aspirin  chewable tablet 81 mg     Frequency: Daily     Dose: 81 mg     Route: Oral    atorvastatin  (LIPITOR) tablet     Frequency: Daily     Dose: 40 mg     Route: Oral    benzonatate (TESSALON) capsule     Frequency: Q8H PRN     Dose: 100 mg     Route: Oral    budesonide (PULMICORT RESPULES) 0.5 mg/2 mL nebulizer suspension     Linked Order: And     Frequency: 2x/day     Dose: 1 mg     Route: Nebulization    cefTRIAXone (ROCEPHIN) 2 g in NS 50 mL IVPB with adaptor     Frequency: Q24H     Dose: 2 g     Route: Intravenous    cholecalciferol  (VITAMIN D3) 1000 unit (25 mcg) tablet     Frequency: Daily     Dose: 1,000 Units     Route: Oral    clonazePAM  (klonoPIN ) tablet     Frequency: 2x/day PRN     Dose: 0.5 mg     Route: Oral    D5W 250 mL flush bag     Linked Order: And     Frequency: Q15 Min PRN     Route: Intravenous    dexAMETHasone  (DECADRON ) tablet 6 mg     Frequency: Daily     Dose: 6 mg     Route: Oral    diphenhydrAMINE (BENADRYL) capsule     Frequency: HS PRN     Dose: 25 mg     Route: Oral    DULoxetine  (CYMBALTA ) delayed release capsule     Frequency: Daily     Dose: 60 mg     Route: Oral    enoxaparin PF (LOVENOX) 40 mg/0.4 mL SubQ injection     Frequency: Q24H     Dose: 40 mg     Route: Subcutaneous    fluticasone  (FLONASE ) 50 mcg per spray nasal spray     Frequency: Daily PRN     Dose: 1 Spray  Route: Each Nostril    gabapentin  (NEURONTIN ) capsule     Frequency: 3x/day     Dose: 400 mg     Route: Oral    HYDROcodone -acetaminophen  (NORCO) 5-325 mg per tablet     Frequency: Q6H PRN     Dose: 1 Tablet     Route: Oral    ipratropium-albuterol  0.5 mg-3 mg(2.5 mg base)/3 mL Solution for Nebulization     Frequency: Q4H PRN     Dose: 3 mL     Route: Nebulization    losartan  (COZAAR ) tablet     Frequency: Daily     Dose: 25 mg     Route: Oral    magnesium  hydroxide (MILK OF MAGNESIA) 400mg  per 5mL oral liquid     Frequency: 4x/day PRN     Dose: 15 mL     Route: Oral     metoprolol  tartrate (LOPRESSOR ) tablet     Frequency: 2x/day     Dose: 25 mg     Route: Oral    multivitamin-minerals-iron  oral liquid     Frequency: Daily     Dose: 15 mL     Route: Oral    NS 250 mL flush bag     Linked Order: And     Frequency: Q15 Min PRN     Route: Intravenous    NS flush syringe     Frequency: Q8HRS     Dose: 3 mL     Route: Intracatheter    NS flush syringe     Frequency: Q1H PRN     Dose: 3 mL     Route: Intracatheter    OLANZapine  (zyPREXA ) tablet     Frequency: NIGHTLY     Dose: 5 mg     Route: Oral    omega-3 fatty acids (LOVAZA ) capsule     Frequency: Daily     Dose: 1 g     Route: Oral    ondansetron  (ZOFRAN ) 2 mg/mL injection     Frequency: Q6H PRN     Dose: 4 mg     Route: Intravenous    pantoprazole  (PROTONIX ) delayed release tablet     Frequency: Daily     Dose: 40 mg     Route: Oral    polyethylene glycol (MIRALAX) oral packet     Frequency: Daily     Dose: 17 g     Route: Oral    prochlorperazine  (COMPAZINE ) tablet     Frequency: 3x/day PRN     Dose: 10 mg     Route: Oral    traZODone  (DESYREL ) tablet     Frequency: NIGHTLY     Dose: 150 mg     Route: Oral        Review of Systems:   All pertinent review of systems as addressed and detailed in HPI above    Vital Signs:  Temperature: 36.6 C (97.9 F)  Heart Rate: 76  BP (Non-Invasive): (!) 110/97  Respiratory Rate: 17  SpO2: 93 %      Physical Exam:  GENERAL: Awake and alert. Chronically ill appearing  SKIN: Warm and dry.  HEAD: Atraumatic, normocephalic.  EYES: Pupils equal and reactive. EOMI.  EARS AND NOSE: Normal to examination.  THROAT: No exudate, no pharyngeal erythema noted.  NECK: Supple, Trachea midline, no lymphadenopathy.  CARDIAC: S1 and S2, no murmurs or gallops. No S3.  LUNGS: clear/diminished. No wheezes or rhonchi. No accessory muscle use noted.  GI: Abdomen soft, nontender. Good bowel  sounds x 4. No organomegaly appreciated.  GU: No suprapubic tenderness. No costovertebral tenderness.  MUSCULOSKELETAL: Good  muscle strength throughout.  EXTREMITIES: No pedal edema noted. Distal pulses equal bilaterally.  NEUROLOGICAL: Cranial nerves grossly intact. No gross focal deficit.  PSYCHIATRIC: Appropriate mood and affect.     Studies:  I have reviewed all available studies within the electronic medical record.    Results for orders placed or performed during the hospital encounter of 07/08/24 (from the past 24 hours)   BASIC METABOLIC PANEL   Result Value Ref Range    SODIUM 145 (H) 133 - 144 mmol/L    POTASSIUM 3.6 3.2 - 5.0 mmol/L    CHLORIDE 105 96 - 106 mmol/L    CO2 TOTAL 26 22 - 30 mmol/L    ANION GAP 14 7 - 18 mmol/L    CALCIUM 8.8 8.3 - 10.7 mg/dL    GLUCOSE 899 74 - 890 mg/dL    BUN 20 8 - 23 mg/dL    CREATININE 9.49 9.49 - 0.90 mg/dL    BUN/CREA RATIO 40     ESTIMATED GFR >90 >90 mL/min/1.11m^2   CBC WITH DIFF   Result Value Ref Range    WBC 7.2 3.7 - 11.0 x10^3/uL    RBC 2.74 (L) 3.85 - 5.22 x10^6/uL    HGB 7.6 (L) 11.5 - 16.0 g/dL    HCT 75.1 (L) 65.1 - 46.0 %    MCV 90.5 78.0 - 100.0 fL    MCH 27.7 26.0 - 32.0 pg    MCHC 30.6 (L) 31.0 - 35.5 g/dL    RDW-CV 84.5 88.4 - 84.4 %    PLATELETS 49 (L) 150 - 400 x10^3/uL    MPV 10.8 8.7 - 12.5 fL   HEPATIC FUNCTION PANEL   Result Value Ref Range    ALBUMIN 2.9 (L) 3.5 - 5.2 g/dL    ALKALINE PHOSPHATASE 362 (H) 35 - 129 U/L    ALT (SGPT) 133 (H) 0 - 33 U/L    AST (SGOT) 331 (H) 0 - 32 U/L    BILIRUBIN TOTAL 0.8 0.2 - 1.2 mg/dL    BILIRUBIN DIRECT 0.5 (H) 0.0 - 0.3 mg/dL    PROTEIN TOTAL 5.7 (L) 6.4 - 8.3 g/dL   MANUAL DIFF AND MORPHOLOGY-SYSMEX   Result Value Ref Range    NEUTROPHIL % 72 %    LYMPHOCYTE %  18 %    MONOCYTE % 5 %    EOSINOPHIL % 0 %    BASOPHIL % 0 %    NEUTROPHIL BANDS % 2   %    REACTIVE LYMPHOCYTE % 3 %    NEUTROPHIL # 5.33 1.50 - 7.70 x10^3/uL    LYMPHOCYTE # 1.51 1.00 - 4.80 x10^3/uL    MONOCYTE # 0.36 0.20 - 1.10 x10^3/uL    EOSINOPHIL # <0.10 <=0.50 x10^3/uL    BASOPHIL # <0.10 <=0.20 x10^3/uL    NRBC FROM MANUAL DIFF 3 (H) <=1 per 100 WBC     ANISOCYTOSIS 2+/Moderate (A) None     CT ANGIO CHEST FOR PULMONARY EMBOLUS W IV CONTRAST  Result Date: 07/08/2024  Impression 1. No evidence of pulmonary embolism. 2. Marginal increase in the size of the right infrahilar mass, right hilar and mediastinal lymphadenopathy compared to the prior study. 3. Other chronic findings as described above. Radiologist location ID: [id number]     CT ABDOMEN PELVIS W IV CONTRAST  Result Date: 07/08/2024  Impression 1. Hepatomegaly, hepatic steatosis and hepatic lesions, not  greatly changed and new pericholecystic fluid now present. 2. Multiple nonobstructing bilateral renal calculi. 3. New free fluid in the pelvis. Radiologist location ID: [id number]        Assessment & Plan:  Active Hospital Problems    Diagnosis    Primary Problem: Acute hypoxic respiratory failure (CMS HCC)    Thickening of wall of gallbladder with pericholecystic fluid    COVID-19    Normocytic anemia    Transaminitis    Malignant neoplasm of lung, unspecified laterality, unspecified part of lung (CMS HCC)    Pulmonary HTN (CMS HCC)    Pneumonia due to COVID-19 virus    Mood disorder (CMS HCC)    Anemia, unspecified type    Metastatic cancer to liver    History of breast cancer    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)    Paroxysmal atrial fibrillation (CMS HCC)    Hypertension    CAD (coronary artery disease)      Patient seen and examined. Reviewed pertinent labs, imaging, and notes.  68 y/o female recently dx SCLC w/liver mets, COPD not on O2 at home, current smoker, was out outpatient infusion center to start chemo and came in for dyspnea and hypoxia, found to have COVID  CTA chest - no PE, marginal increase in size of right infrahilar mass, right hilar and mediastinal lymphadenopathy compared to prior study. CT A/P: hepatomegaly, hepatic steatosis, and hepatic lesions not much change, new pericholecystic fluid now present.   Wean oxygen as patient tolerates for SpO2 >/=92%, on  2LNC  Continue steroids  Agree with empiric antibiotics-ceftriaxone  U/S showed acute cholecystitis with pericholecystic fluid and wall thickening  Continue Brovana/Pulmicort   We will try to send for sputum culture   We will add albuterol  nebs   Pulmonary standpoint would be moderate risk for surgery but not prohibitive  Will follow closely  This patient was seen and examined with my attending and he will add any further recommendations.     Merlin KANDICE Overland, APRN-FNP-BC    Patient seen and examined  Pertinent labs and images/reports  Chart records/history reviewed  Assessment and plan of care reviewed in detail  I performed majority of Medical Decision Making of this visit.  Patient has chronic illness requiring review of testing, ordering of follow-up testing, and interpretation of test results.    Subjective:  This is a pleasant 68 year old female with recently diagnosed small-cell lung cancer with liver Mets and Chronic Obstructive Pulmonary Disease who still smokes.  She comes back with shortness of breath and malaise in his COVID positive.  Imaging is stable.  She is on 2 L nasal cannula O2 which is new for her.  She has mild nonproductive cough.    Gen:  NAD, pleasant  HEENT:  nc/at, cn intact  Neck:  supple, no JVD  Cv:  RRR, no m/r/g  Resp:  BS diminished, breath sounds tight with wheeze  Abd:  soft nt nd  Ext:  no c/c/e pulse 2+  Skin:  no rash, warm/day  Neuro:  no focal motor nerve deficits noted    Imaging:  Chest x-ray stable with interstitial change and right lower lobe haziness    Labs:  White blood cell 7, creatinine 0.5    Assessment:  COVID pneumonitis  Chronic Obstructive Pulmonary Disease  Small-cell lung cancer right lower lobe  Cigarette smoker    Plan:  We will give steroids nebs, empiric antibiotics  Sputum culture requested  No smoking please  Appreciate consult request we will follow up closely  D/w aprn plan of care in detail    Trey Eck, MD  07/10/2024 12:56         [1] No Known  Allergies  [2]   Social History  Tobacco Use    Smoking status: Every Day     Current packs/day: 0.50     Average packs/day: 0.5 packs/day for 52.7 years (26.4 ttl pk-yrs)     Types: Cigarettes     Start date: 1973    Smokeless tobacco: Never   Vaping Use    Vaping status: Never Used   Substance Use Topics    Alcohol use: Yes     Comment: occasional    Drug use: Yes     Types: Marijuana

## 2024-07-10 NOTE — Assessment & Plan Note (Signed)
 Consulted general surgery for likely cholecystectomy

## 2024-07-10 NOTE — Progress Notes (Signed)
 [facility]  Progress Note    [name], [name], 68 y.o. female  Date of Birth:  08/04/1956  Encounter Start Date:  07/08/2024  Inpatient Admission Date: 07/08/2024  Date of service: 07/10/2024       Interim Update  Patient was seen and examined at bedside. Today she complains of RUQ abdominal pain. Denied any nausea.  Discussed US  and CT abdomen findings with Gen surg- plan for likely intervention tomorrow. Pulm consulted for clearance    acetaminophen  (TYLENOL ) tablet, 650 mg, Oral, Q4H PRN  aluminum-magnesium  hydroxide-simethicone (MAG-AL PLUS) 200-200-20 mg per 5 mL oral liquid, 30 mL, Oral, Q4H PRN  anastrozole  (ARIMIDEX ) tablet, 1 mg, Oral, Daily  budesonide (PULMICORT RESPULES) 0.5 mg/2 mL nebulizer suspension, 1 mg, Nebulization, 2x/day   And  arformoterol (BROVANA) 15 mcg/2 mL nebulizer solution, 15 mcg, Nebulization, 2x/day  aspirin  chewable tablet 81 mg, 81 mg, Oral, Daily  atorvastatin  (LIPITOR) tablet, 40 mg, Oral, Daily  azithromycin (ZITHROMAX) tablet, 250 mg, Oral, Daily  benzonatate (TESSALON) capsule, 100 mg, Oral, Q8H PRN  cefTRIAXone (ROCEPHIN) 2 g in NS 50 mL IVPB with adaptor, 2 g, Intravenous, Q24H  cholecalciferol  (VITAMIN D3) 1000 unit (25 mcg) tablet, 1,000 Units, Oral, Daily  clonazePAM  (klonoPIN ) tablet, 0.5 mg, Oral, 2x/day PRN  NS 250 mL flush bag, , Intravenous, Q15 Min PRN   And  D5W 250 mL flush bag, , Intravenous, Q15 Min PRN  dexAMETHasone  (DECADRON ) tablet 6 mg, 6 mg, Oral, Daily  diphenhydrAMINE (BENADRYL) capsule, 25 mg, Oral, HS PRN  DULoxetine  (CYMBALTA ) delayed release capsule, 60 mg, Oral, Daily  enoxaparin PF (LOVENOX) 40 mg/0.4 mL SubQ injection, 40 mg, Subcutaneous, Q24H  fluticasone  (FLONASE ) 50 mcg per spray nasal spray, 1 Spray, Each Nostril, Daily PRN  gabapentin  (NEURONTIN ) capsule, 400 mg, Oral, 3x/day  HYDROcodone -acetaminophen  (NORCO) 5-325 mg per tablet, 1 Tablet, Oral, Q6H PRN  ipratropium-albuterol  0.5 mg-3 mg(2.5 mg base)/3 mL Solution  for Nebulization, 3 mL, Nebulization, Q4H PRN  losartan  (COZAAR ) tablet, 25 mg, Oral, Daily  magnesium  hydroxide (MILK OF MAGNESIA) 400mg  per 5mL oral liquid, 15 mL, Oral, 4x/day PRN  metoprolol  tartrate (LOPRESSOR ) tablet, 25 mg, Oral, 2x/day  multivitamin-minerals-iron  oral liquid, 15 mL, Oral, Daily  NS flush syringe, 3 mL, Intracatheter, Q8HRS  NS flush syringe, 3 mL, Intracatheter, Q1H PRN  OLANZapine  (zyPREXA ) tablet, 5 mg, Oral, NIGHTLY  omega-3 fatty acids (LOVAZA ) capsule, 1 g, Oral, Daily  ondansetron  (ZOFRAN ) 2 mg/mL injection, 4 mg, Intravenous, Q6H PRN  pantoprazole  (PROTONIX ) delayed release tablet, 40 mg, Oral, Daily  polyethylene glycol (MIRALAX) oral packet, 17 g, Oral, Daily  prochlorperazine  (COMPAZINE ) tablet, 10 mg, Oral, 3x/day PRN  traZODone  (DESYREL ) tablet, 150 mg, Oral, NIGHTLY            Vital Signs:    Filed Vitals:    07/09/24 2300 07/10/24 0313 07/10/24 0700 07/10/24 0837   BP: 128/76 (!) 151/85 (!) 157/91    Pulse: 81 89 91    Resp: 16 17 17     Temp: 36.7 C (98.1 F) 36.7 C (98.1 F) 36.7 C (98.1 F)    SpO2: 93% 94% 94% 95%      Body mass index is 25.89 kg/m.    Physical Exam:  General: 68 year old, acute and chronically ill-appearing female, in no acute distress.   Respiratory: Diminished breath sounds bilaterally with wheezing.    Cardiovascular: RRR,    GI: Abdmen with some tenderness, more so in bilateral lower quadrants. Slightly firm.   Neurologic: Responds  to questions and commands appropriately, but in the context of being a poor historian.    Studies:  I have reviewed all available studies within the electronic medical record.    Results for orders placed or performed during the hospital encounter of 07/08/24 (from the past 24 hours)   PT/INR   Result Value Ref Range    PROTHROMBIN TIME 16.3 (H) 12.1 - 15.3 seconds    INR 1.27 (H) 0.86 - 1.14   BASIC METABOLIC PANEL   Result Value Ref Range    SODIUM 145 (H) 133 - 144 mmol/L    POTASSIUM 3.6 3.2 - 5.0 mmol/L    CHLORIDE  105 96 - 106 mmol/L    CO2 TOTAL 26 22 - 30 mmol/L    ANION GAP 14 7 - 18 mmol/L    CALCIUM 8.8 8.3 - 10.7 mg/dL    GLUCOSE 899 74 - 890 mg/dL    BUN 20 8 - 23 mg/dL    CREATININE 9.49 9.49 - 0.90 mg/dL    BUN/CREA RATIO 40     ESTIMATED GFR >90 >90 mL/min/1.69m^2   CBC WITH DIFF   Result Value Ref Range    WBC 7.2 3.7 - 11.0 x10^3/uL    RBC 2.74 (L) 3.85 - 5.22 x10^6/uL    HGB 7.6 (L) 11.5 - 16.0 g/dL    HCT 75.1 (L) 65.1 - 46.0 %    MCV 90.5 78.0 - 100.0 fL    MCH 27.7 26.0 - 32.0 pg    MCHC 30.6 (L) 31.0 - 35.5 g/dL    RDW-CV 84.5 88.4 - 84.4 %    PLATELETS 49 (L) 150 - 400 x10^3/uL    MPV 10.8 8.7 - 12.5 fL   HEPATIC FUNCTION PANEL   Result Value Ref Range    ALBUMIN 2.9 (L) 3.5 - 5.2 g/dL    ALKALINE PHOSPHATASE 362 (H) 35 - 129 U/L    ALT (SGPT) 133 (H) 0 - 33 U/L    AST (SGOT) 331 (H) 0 - 32 U/L    BILIRUBIN TOTAL 0.8 0.2 - 1.2 mg/dL    BILIRUBIN DIRECT 0.5 (H) 0.0 - 0.3 mg/dL    PROTEIN TOTAL 5.7 (L) 6.4 - 8.3 g/dL   MANUAL DIFF AND MORPHOLOGY-SYSMEX   Result Value Ref Range    NEUTROPHIL % 72 %    LYMPHOCYTE %  18 %    MONOCYTE % 5 %    EOSINOPHIL % 0 %    BASOPHIL % 0 %    NEUTROPHIL BANDS % 2   %    REACTIVE LYMPHOCYTE % 3 %    NEUTROPHIL # 5.33 1.50 - 7.70 x10^3/uL    LYMPHOCYTE # 1.51 1.00 - 4.80 x10^3/uL    MONOCYTE # 0.36 0.20 - 1.10 x10^3/uL    EOSINOPHIL # <0.10 <=0.50 x10^3/uL    BASOPHIL # <0.10 <=0.20 x10^3/uL    NRBC FROM MANUAL DIFF 3 (H) <=1 per 100 WBC    ANISOCYTOSIS 2+/Moderate (A) None     CT ANGIO CHEST FOR PULMONARY EMBOLUS W IV CONTRAST  Result Date: 07/08/2024  Impression 1. No evidence of pulmonary embolism. 2. Marginal increase in the size of the right infrahilar mass, right hilar and mediastinal lymphadenopathy compared to the prior study. 3. Other chronic findings as described above. Radiologist location ID: [id number]     CT ABDOMEN PELVIS W IV CONTRAST  Result Date: 07/08/2024  Impression 1. Hepatomegaly, hepatic steatosis and hepatic lesions, not greatly changed and new  pericholecystic fluid now present. 2. Multiple nonobstructing bilateral renal calculi. 3. New free fluid in the pelvis. Radiologist location ID: [id number]     XR AP MOBILE CHEST  Result Date: 07/08/2024  Impression 1. Right hilar adenopathy. 2. Vague pulmonary opacities could be mild pneumonia or atelectasis. Radiologist location ID: [id number]    .    Assessment and Plan:  Assessment & Plan  Acute hypoxic respiratory failure (CMS HCC)  Acute hypoxic respiratory failure currently on 2 L nasal cannula.  Significantly desaturating when ambulating   CT angiogram of the chest that has not show any evidence pulmonary embolism although marginal increase in the size of infrahilar mass, right hilar and mediastinal lymphadenopathy.  Chest x-ray showed vague opacities in the that could possibly be mild pneumoniae was atelectasis   Empirically being treated steroids and antibiotics for community-acquired pneumonia  Viral panel positive for COVID   . We'll get a Hemoccult card as well on the possibility that this is truly anemia and monitor hemoglobins. Of note, this could also be worsening progression of the patient's known small cell carcinoma of the lungs. 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  Transfuse for hemoglobin less than 7  Transaminitis  Likely secondary to liver metastasis  Malignant neoplasm of lung, unspecified laterality, unspecified part of lung (CMS HCC)  Known history of small-cell carcinoma, planned for outpatient chemotherapy with carboplatin/etoposide/tecenteriq  Consult Oncology  Pulmonary HTN (CMS [facility])  Consult pulmonology  Mood disorder (CMS HCC)  Continue trazodone   Pneumonia due to COVID-19 virus  Positive on viral panel with new oxygen requirement  Treat with DuoNebs and Decadron   Empirically treating with antibiotics for probable community-acquired pneumonia  Symptomatic anemia    Thickening of wall of gallbladder with pericholecystic fluid  Consulted general surgery for likely cholecystectomy       Winnifred Dufford R Authur Cubit, MD

## 2024-07-10 NOTE — Consults (Signed)
 [name]   GI Progress Note    [name], [name], 68 y.o. female  Date of Admission:  07/08/2024  Date of Birth:  1956/01/27      SUBJECTIVE:  Patient resting in bed, reports she did not rest well this morning.  Did seem somewhat dyspneic and O2 was off, which was replaced with 3%.    OBJECTIVE:  Vital Signs:  Temperature: 36.7 C (98.1 F)  Heart Rate: 91  BP (Non-Invasive): (!) 157/91  Respiratory Rate: 17  SpO2: 95 %      Physical Exam  Constitutional:       Appearance: No distress  HENT:      Head: Normocephalic and atraumatic.      Mouth: Mucous membranes are moist     Eyes:PERRLA  Cardiovascular:      Rate and Rhythm: Normal rate and regular rhythm.    Pulmonary:      Breath sounds: clear, diminished   Abdominal:      General: Abdomen is soft, nontender  Skin:     General: Skin is dry.   Neurological:      Mental Status: Alert and oriented x3        LABS:  I have reviewed all lab results.  Lab Results Today:    Results for orders placed or performed during the hospital encounter of 07/08/24 (from the past 24 hours)   PT/INR   Result Value Ref Range    PROTHROMBIN TIME 16.3 (H) 12.1 - 15.3 seconds    INR 1.27 (H) 0.86 - 1.14   BASIC METABOLIC PANEL   Result Value Ref Range    SODIUM 145 (H) 133 - 144 mmol/L    POTASSIUM 3.6 3.2 - 5.0 mmol/L    CHLORIDE 105 96 - 106 mmol/L    CO2 TOTAL 26 22 - 30 mmol/L    ANION GAP 14 7 - 18 mmol/L    CALCIUM 8.8 8.3 - 10.7 mg/dL    GLUCOSE 899 74 - 890 mg/dL    BUN 20 8 - 23 mg/dL    CREATININE 9.49 9.49 - 0.90 mg/dL    BUN/CREA RATIO 40     ESTIMATED GFR >90 >90 mL/min/1.75m^2   CBC WITH DIFF   Result Value Ref Range    WBC 7.2 3.7 - 11.0 x10^3/uL    RBC 2.74 (L) 3.85 - 5.22 x10^6/uL    HGB 7.6 (L) 11.5 - 16.0 g/dL    HCT 75.1 (L) 65.1 - 46.0 %    MCV 90.5 78.0 - 100.0 fL    MCH 27.7 26.0 - 32.0 pg    MCHC 30.6 (L) 31.0 - 35.5 g/dL    RDW-CV 84.5 88.4 - 84.4 %    PLATELETS 49 (L) 150 - 400 x10^3/uL    MPV 10.8 8.7 - 12.5 fL   HEPATIC FUNCTION PANEL    Result Value Ref Range    ALBUMIN 2.9 (L) 3.5 - 5.2 g/dL    ALKALINE PHOSPHATASE 362 (H) 35 - 129 U/L    ALT (SGPT) 133 (H) 0 - 33 U/L    AST (SGOT) 331 (H) 0 - 32 U/L    BILIRUBIN TOTAL 0.8 0.2 - 1.2 mg/dL    BILIRUBIN DIRECT 0.5 (H) 0.0 - 0.3 mg/dL    PROTEIN TOTAL 5.7 (L) 6.4 - 8.3 g/dL   MANUAL DIFF AND MORPHOLOGY-SYSMEX   Result Value Ref Range    NEUTROPHIL % 72 %    LYMPHOCYTE %  18 %  MONOCYTE % 5 %    EOSINOPHIL % 0 %    BASOPHIL % 0 %    NEUTROPHIL BANDS % 2   %    REACTIVE LYMPHOCYTE % 3 %    NEUTROPHIL # 5.33 1.50 - 7.70 x10^3/uL    LYMPHOCYTE # 1.51 1.00 - 4.80 x10^3/uL    MONOCYTE # 0.36 0.20 - 1.10 x10^3/uL    EOSINOPHIL # <0.10 <=0.50 x10^3/uL    BASOPHIL # <0.10 <=0.20 x10^3/uL    NRBC FROM MANUAL DIFF 3 (H) <=1 per 100 WBC    ANISOCYTOSIS 2+/Moderate (A) None       Radiology Results:   Results for orders placed or performed during the hospital encounter of 07/08/24 (from the past 72 hours)   XR AP MOBILE CHEST     Status: None    Narrative    [name]    XR AP MOBILE CHEST performed on 07/08/2024 10:48 AM.    INDICATION:  Chest Pain   Additional History:  chest pain    TECHNIQUE:  Single portable frontal view of the chest.  1 views/1 images submitted for interpretation.    COMPARISON:  Chest x-ray 06/17/2024  ___________________________________  FINDINGS:    Median sternotomy noted. Heart size and vascularity are similar. Right hilar adenopathy. Heart size normal.  Vague groundglass opacities at both lung bases and minimally right upper lobe atelectasis or minimal pneumonitis. No pleural effusion. No pneumothorax. Dextrocurvature thoracic spine.  ___________________________________    Impression    1. Right hilar adenopathy.    2. Vague pulmonary opacities could be mild pneumonia or atelectasis.        Radiologist location ID: [id number]     CT ABDOMEN PELVIS W IV CONTRAST     Status: None    Narrative    [name]    CT ABDOMEN PELVIS W IV CONTRAST performed on 07/08/2024 1:18  PM.    INDICATION:  abd pain   Additional History:  abdomen pain, nausea, vomiting    TECHNIQUE:  CT abdomen and pelvis with axial, coronal, and sagittal multiplanar reformations, performed with contrast. Dose modulation, automated exposure control, and/or iterative reconstruction were used for dose reduction.    CONTRAST:  100 mL Isovue  370 IV    COMPARISON:  Abdomen CT 06/14/2024  ___________________________________  FINDINGS:    LOWER THORAX: Unremarkable.    HEPATOBILIARY:  Hepatomegaly and hepatic steatosis. Scattered hypodense lesions in the liver redemonstrated not greatly changed and the short-term interval.  Pericholecystic fluid now present, gallbladder slightly contracted.  No intrahepatic or extrahepatic ductal dilation.  PANCREAS: No focal masses or ductal dilatation.  SPLEEN: No splenomegaly.    ADRENALS: No adrenal nodules.  KIDNEYS/URETERS/BLADDER: No hydronephrosis,. Bilateral renal cyst and multiple bilateral renal calculi redemonstrated.  REPRODUCTIVE: No significant findings.    PERITONEUM / RETROPERITONEUM: New free fluid in the dependent pelvis.  LYMPH NODES: No lymphadenopathy. Small retroperitoneal are unchanged. Lymph nodes  VESSELS: Abdominal aorta is normal in caliber. Severe atherosclerotic disease aorta.    GI TRACT: No distention, wall thickening, or inflammatory stranding.  Appendix is normal. Colon diverticulosis. ABDOMINAL WALL:  Intact.    SOFT TISSUES: Unremarkable.  BONES: Degenerative changes are present in the spine  ___________________________________    Impression    1. Hepatomegaly, hepatic steatosis and hepatic lesions, not greatly changed and new pericholecystic fluid now present.  2. Multiple nonobstructing bilateral renal calculi.  3. New free fluid in the pelvis.      Radiologist location  ID: [id number]     CT ANGIO CHEST FOR PULMONARY EMBOLUS W IV CONTRAST     Status: None    Narrative    [name]    CT ANGIO CHEST FOR PULMONARY EMBOLUS W IV CONTRAST performed  on 07/08/2024 4:34 PM.    INDICATION:  PE   Additional History:  Dyspnea, chest pain, history of small cell lung cancer, coronary stent, and CABG.    TECHNIQUE:  CT angiography of the chest performed for evaluation of the pulmonary arteries, with axial, sagittal, and coronal multiplanar reformations and a coronal 3D MIP.  Dose modulation, automated exposure control, and/or iterative reconstruction were used for dose reduction.    CONTRAST: 75 mL of Isovue  370 IV    COMPARISON:  June 14, 2024  ___________________________________  FINDINGS:    HEART / GREAT VESSELS: There are no filling defects in the pulmonary arteries to suggest a pulmonary embolism.  The heart is normal in size without pericardial effusion.  The thoracic aorta is unremarkable.     LUNGS / AIRWAYS: Focal area of scarring is noted in the right upper lobe. No significant interval change compared to the prior study. Right infrahilar masslike lesion is again noted, demonstrates marginal increase in size compared to the prior study now measuring approximately 3.8 x 3.3 cm. Interlobular emphysematous changes are noted bilaterally. No lobar consolidation, pleural effusions or pneumothorax.  There is narrowing of the right mainstem and right segmental and proximal subsegmental bronchioles.. There are no suspicious pulmonary nodules.    MEDIASTINUM: There is right supraclavicular lymphadenopathy, measuring 2.2 cm, marginally increased in size compared to the prior study. Few scattered right supraclavicular lymph nodes are unchanged. Marginal increase in the size of the right paratracheal conglomerate lymph nodal mass now measuring 2.3 cm (previously 1.9 cm. Marginal increase in the right hilar and infrahilar lymphadenopathy. The subcarinal lymph node measures 3 cm.    UPPER ABDOMEN: Few scattered left para-aortic lymph nodes are noted, not significant by size criteria. These are partially visualized on the current study. Previously noted liver lesions are  not well identified on the current study due to the phase of IV contrast.    BONES:  ACDF of the lower cervical spine. Median sternotomy and mediastinal clips are noted.    OTHER:  The visualized thyroid gland is unremarkable.  ___________________________________    Impression    1. No evidence of pulmonary embolism.  2. Marginal increase in the size of the right infrahilar mass, right hilar and mediastinal lymphadenopathy compared to the prior study.  3. Other chronic findings as described above.        Radiologist location ID: [id number]     US  RT UPPER QUADRANT     Status: None    Narrative    Zanovia JEAN Ratledge    US  RT UPPER QUADRANT performed on 07/09/2024 2:47 PM.    INDICATION:  liver mets, worsening elevated lfts, pericholecystic fluid   Additional History:  liver mets, worsening elevated lfts, pericholecystic fluid    TECHNIQUE:  Transabdominal grayscale sonogram of the right upper quadrant, with color Doppler imaging.      COMPARISON:  06/14/2024  ___________________________________  FINDINGS:    The visualized pancreas is unremarkable; the tail is obscured.     The liver measures 21.4 along its right lateral margin and demonstrates diffuse increased hepatic echotexture.  No focal lesions are seen.  The portal vein is patent, with appropriate direction of flow on color Doppler and waveform imaging.  The common bile duct measures up to 7.6 mm in caliber, and there is no intrahepatic ductal dilation.    Gallbladder wall thickening with pericholecystic fluid.       GB wall thickness: 3.6    The right kidney measures 12.7 x 5.4 x 4.5, without hydronephrosis.    Trace ascites.  ___________________________________    Impression    Gallbladder wall thickening with pericholecystic fluid consistent with acute cholecystitis.    Dilated common bile duct without distal obstructing lesion identified.    Hepatomegaly with hepatic steatosis.    Trace ascites.          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  No overt GI bleeding reported.  LFTs trended up yesterday and right upper quadrant ultrasound showed gallbladder wall thickening with pericholecystic fluid consistent with acute cholecystitis,, CBD 7.6 mm, hepatomegaly and hepatic steatosis. Patient did report mid abdominal discomfort with nausea on admission. Will send for MRCP.  Keep NPO for now.    I independently of the attending provider spent a total of (35) minutes in direct/indirect care of this patient including initial evaluation, review of laboratory, radiology, diagnostic studies, review of medical record, order entry and coordination of care.   Alan BIRCH Segsworth, FNP-BC      I have seen and examined the patient personally and  in concert with the Alan BIRCH Castles, FNP-BC  as noted above.  The above note has been modified as necessary by me to reflect my own, personal collection/validation of the patient's HPI, patient history, physical examination, and assessment and plan.  Hemoglobin and platelet count was dropping this morning, oncology following.  No overt GI bleeding reported.  LFTs trended up yesterday and right upper quadrant ultrasound showed gallbladder wall thickening with pericholecystic fluid consistent with acute cholecystitis,, CBD 7.6 mm, hepatomegaly and hepatic  steatosis. Patient did report mid abdominal discomfort with nausea on admission. Will send for MRCP.  Keep NPO for now.    Savaughn Karwowski, MD  GI    Duration of visit 35 minutes

## 2024-07-10 NOTE — Assessment & Plan Note (Signed)
 Consult pulmonology

## 2024-07-10 NOTE — Consults (Signed)
 [facility]   Consult  Note  [name], [name], 68 y.o. 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  Options include percutaneous drainage versus robotic cholecystectomy., I have discussed the case with anesthesia and they are recommending a pulmonary consultation prior to surgery    HPI:  [name] is a 68 y.o. female has had several weeks of chronic pain in the right upper quadrant going across her upper abdomen.  Associated with nausea and inability to tolerate a diet.  She has recently readmitted for you to pass exacerbation of Chronic Obstructive Pulmonary Disease and shortness of breath.  She has a history of having small cell malignancy in his on chemotherapy for this there is also evidence of metastatic disease to the liver and on recently elevation of her liver enzymes.  Recent ultrasound is consistent with acute cholecystitis with pericholecystic fluid and wall thickening.    Historical Data   Past Medical History:   Diagnosis Date    Abdominal pain     Cancer (CMS HCC)     Chest pain     Chronic lung disease     Coronary artery disease     Dyslipidemia     Essential hypertension     Heart disease     History of percutaneous coronary intervention     History of stress test     Mixed hyperlipidemia     Unspecified chronic bronchitis          Past Surgical History:   Procedure Laterality Date    CARDIAC  CATHETERIZATION      CORONARY ANGIOPLASTY      CORONARY ARTERY STENT PLACEMENT      HX BACK SURGERY      HX CORONARY ARTERY BYPASS GRAFT      HX MASTECTOMY, SIMPLE Bilateral     HX TUBAL LIGATION      NECK SURGERY        Allergies[1]  Family History  Family Medical History:       Problem Relation (Age of Onset)    Arthritis Mother, Maternal Grandmother    Cancer Father, Other    Heart Attack Father, Other    Heart Disease Brother, Other    Hypertension (High Blood Pressure) Mother, Father, Other    Pacemaker Mother           Social History  Social History[2]         (Not in an outpatient encounter)      acetaminophen  (TYLENOL ) tablet, 650 mg, Oral, Q4H PRN  aluminum-magnesium  hydroxide-simethicone (MAG-AL PLUS) 200-200-20 mg per 5 mL oral liquid, 30 mL, Oral, Q4H PRN  anastrozole  (ARIMIDEX ) tablet, 1 mg, Oral, Daily  budesonide (PULMICORT RESPULES) 0.5 mg/2 mL nebulizer suspension, 1 mg, Nebulization, 2x/day   And  arformoterol (BROVANA) 15 mcg/2 mL nebulizer solution, 15 mcg, Nebulization, 2x/day  aspirin  chewable tablet 81 mg, 81 mg, Oral, Daily  atorvastatin  (LIPITOR) tablet, 40 mg, Oral, Daily  benzonatate (TESSALON) capsule, 100 mg, Oral, Q8H PRN  cefTRIAXone (ROCEPHIN) 2 g in NS 50 mL IVPB with adaptor, 2 g, Intravenous, Q24H  cholecalciferol  (VITAMIN D3) 1000 unit (25 mcg) tablet, 1,000 Units, Oral, Daily  clonazePAM  (klonoPIN ) tablet, 0.5 mg, Oral, 2x/day PRN  NS 250 mL flush bag, , Intravenous, Q15 Min PRN   And  D5W 250 mL flush bag, , Intravenous, Q15 Min PRN  dexAMETHasone  (DECADRON ) tablet 6 mg, 6 mg, Oral, Daily  diphenhydrAMINE (BENADRYL) capsule, 25 mg, Oral, HS PRN  DULoxetine  (CYMBALTA ) delayed release capsule, 60 mg, Oral, Daily  enoxaparin PF (LOVENOX) 40 mg/0.4 mL SubQ injection, 40 mg, Subcutaneous, Q24H  fluticasone  (FLONASE ) 50 mcg per spray nasal spray, 1 Spray, Each Nostril, Daily PRN  gabapentin  (NEURONTIN ) capsule, 400 mg, Oral, 3x/day  HYDROcodone -acetaminophen  (NORCO) 5-325 mg per  tablet, 1 Tablet, Oral, Q6H PRN  ipratropium-albuterol  0.5 mg-3 mg(2.5 mg base)/3 mL Solution for Nebulization, 3 mL, Nebulization, Q4H PRN  losartan  (COZAAR ) tablet, 25 mg, Oral, Daily  magnesium  hydroxide (MILK OF MAGNESIA) 400mg  per 5mL oral liquid, 15 mL, Oral, 4x/day PRN  metoprolol  tartrate (LOPRESSOR ) tablet, 25 mg, Oral, 2x/day  multivitamin-minerals-iron  oral liquid, 15 mL, Oral, Daily  NS flush syringe, 3 mL, Intracatheter, Q8HRS  NS flush syringe, 3 mL, Intracatheter, Q1H PRN  OLANZapine  (zyPREXA ) tablet, 5 mg, Oral, NIGHTLY  omega-3 fatty acids (LOVAZA ) capsule, 1 g, Oral, Daily  ondansetron  (ZOFRAN ) 2 mg/mL injection, 4 mg, Intravenous, Q6H PRN  pantoprazole  (PROTONIX ) delayed release tablet, 40 mg, Oral, Daily  polyethylene glycol (MIRALAX) oral packet, 17 g, Oral, Daily  prochlorperazine  (COMPAZINE ) tablet, 10 mg, Oral, 3x/day PRN  traZODone  (DESYREL ) tablet, 150 mg, Oral, NIGHTLY      Active Orders   Microbiology    FECAL OCCULT BLD (TRIPLE CARD)     Frequency: ONE TIME     Number of Occurrences: 1 Occurrences    RESPIRATORY CULTURE AND GRAM STAIN, AEROBIC     Frequency: ONE TIME     Number of Occurrences: 1 Occurrences    SPUTUM SCREEN     Frequency: Once     Number of Occurrences: 1 Occurrences   Imaging    MRI MRCP WO CONTRAST     Frequency: ONE TIME     Number of Occurrences: 1 Occurrences     Scheduling Instructions:      Isolation Status:  Enhanced Droplet         Lab    HELICOBACTER PYLORI AG, BY EIA     Frequency: ONE TIME     Number of Occurrences: 1 Occurrences   Diet    DIET NPO - NOW EXCEPT ALL MEDS WITH SIPS OF WATER      Frequency: All Meals     Number of Occurrences: 1 Occurrences   Nursing    ACTIVITY     Frequency: TID (1000,1400,2200)     Number of Occurrences: Until Specified    ACTIVITY     Frequency: UNTIL DISCONTINUED     Number of Occurrences: Until Specified    INTAKE AND OUTPUT Q4H     Frequency: Q4H     Number of Occurrences: Until Specified    Notify MD Vital Signs      Frequency: PRN     Number of Occurrences: Until Specified     Order Comments: MAP < 60      Notify MD Vital Signs     Frequency: PRN     Number of Occurrences: Until Specified  NURSE TO ENTER SECONDARY ORDER Other - (specify in comments) (ECG FOR CHEST PAIN)     Frequency: PRN     Number of Occurrences: Until Specified    PT IS INTERMEDIATE RISK FOR VENOUS THROMBOEMBOLISM     Frequency: CONTINUOUS     Number of Occurrences: Until Specified    PULSE OXIMETRY CONTINUOUS     Frequency: CONTINUOUS     Number of Occurrences: Until Specified    Small Volume Medication Infusion Line Flush     Linked Order: And     Frequency: UNTIL DISCONTINUED     Number of Occurrences: Until Specified     Order Comments: All intermittent infusions/IVPBs will be run as a secondary infusion and must be flushed to ensure the patient receives the entire dose. Administer and document on the corresponding primary flush order that matches the base solution of the intermittent infusion/IVPB. An iso-osmotic solution is compatible and may be flushed with D5W or NS. Page the service pharmacist with any questions.      TELEMETRY MONITORING X 48H     Frequency: CONTINUOUS X 48 HRS     Number of Occurrences: 48 Hours    VITAL SIGNS  Q4H     Frequency: Q4H     Number of Occurrences: Until Specified    WEIGH PATIENT     Frequency: DAILY (0600)     Number of Occurrences: Until Specified    WEIGH PATIENT     Frequency: UPON ADMISSION     Number of Occurrences: 1 Occurrences   Code Status    FULL CODE: ATTEMPT RESUSCITATION / CPR     Frequency: CONTINUOUS     Number of Occurrences: Until Specified     Order Comments: Patient wishes for full ICU level care including advanced airway interventions / mechanical ventilation.     In the event of pulseless cardiac arrest, patient consents to ACLS (advanced cardiac life support) to attempt resuscitation.  IE - Consents to chest compressions, life support including intubation, mechanical ventilation,  defibrillation/cardioversion as indicated.         Consult    IP CONSULT TO GASTROENTEROLOGY OZDEN, NURI     Frequency: ONE TIME     Number of Occurrences: 1 Occurrences    IP CONSULT TO GENERAL SURGERY Tavon Magnussen C     Frequency: ONE TIME     Number of Occurrences: 1 Occurrences    IP CONSULT TO ONCOLOGY PONUGUPATI, JOHN     Frequency: ONE TIME     Number of Occurrences: 1 Occurrences    IP CONSULT TO PULMONOLOGY WADE, ELSIE BLUNT     Frequency: ONE TIME     Number of Occurrences: 1 Occurrences   Isolation    ENHANCED DROPLET ISOLATION     Frequency: CONTINUOUS     Number of Occurrences: Until Specified     Order Comments: Private room  Wear a fitted N95/MSA/CAPR when entering room  Wear gown, gloves, and eye protection as indicated  Prior to transport, notify receiving department of precautions  Prior to transport, ensure patient is wearing a mask and follows Respiratory Hygiene/Cough Etiquette         Respiratory Care    IP CONSULT TO RESPIRATORY THERAPY     Frequency: ONE TIME     Number of Occurrences: 1 Occurrences    OXYGEN WEANING PARAMETERS     Frequency: UNTIL DISCONTINUED     Number of Occurrences: Until Specified   IV    INSERT &  MAINTAIN PERIPHERAL IV ACCESS     Frequency: UNTIL DISCONTINUED     Number of Occurrences: Until Specified    PERIPHERAL IV DRESSING CHANGE     Frequency: PRN     Number of Occurrences: Until Specified   Precaution    FALL PRECAUTION     Frequency: CONTINUOUS     Number of Occurrences: Until Specified   Case Request    CASE REQUEST SURGICAL: ULTRASOUND ENDOBRONCHIAL     Frequency: ONE TIME     Number of Occurrences: 1 Occurrences   Medications    acetaminophen  (TYLENOL ) tablet     Frequency: Q4H PRN     Dose: 650 mg     Route: Oral    aluminum-magnesium  hydroxide-simethicone (MAG-AL PLUS) 200-200-20 mg per 5 mL oral liquid     Frequency: Q4H PRN     Dose: 30 mL     Route: Oral    anastrozole  (ARIMIDEX ) tablet     Frequency: Daily     Dose: 1 mg     Route: Oral     arformoterol (BROVANA) 15 mcg/2 mL nebulizer solution     Linked Order: And     Frequency: 2x/day     Dose: 15 mcg     Route: Nebulization    aspirin  chewable tablet 81 mg     Frequency: Daily     Dose: 81 mg     Route: Oral    atorvastatin  (LIPITOR) tablet     Frequency: Daily     Dose: 40 mg     Route: Oral    benzonatate (TESSALON) capsule     Frequency: Q8H PRN     Dose: 100 mg     Route: Oral    budesonide (PULMICORT RESPULES) 0.5 mg/2 mL nebulizer suspension     Linked Order: And     Frequency: 2x/day     Dose: 1 mg     Route: Nebulization    cefTRIAXone (ROCEPHIN) 2 g in NS 50 mL IVPB with adaptor     Frequency: Q24H     Dose: 2 g     Route: Intravenous    cholecalciferol  (VITAMIN D3) 1000 unit (25 mcg) tablet     Frequency: Daily     Dose: 1,000 Units     Route: Oral    clonazePAM  (klonoPIN ) tablet     Frequency: 2x/day PRN     Dose: 0.5 mg     Route: Oral    D5W 250 mL flush bag     Linked Order: And     Frequency: Q15 Min PRN     Route: Intravenous    dexAMETHasone  (DECADRON ) tablet 6 mg     Frequency: Daily     Dose: 6 mg     Route: Oral    diphenhydrAMINE (BENADRYL) capsule     Frequency: HS PRN     Dose: 25 mg     Route: Oral    DULoxetine  (CYMBALTA ) delayed release capsule     Frequency: Daily     Dose: 60 mg     Route: Oral    enoxaparin PF (LOVENOX) 40 mg/0.4 mL SubQ injection     Frequency: Q24H     Dose: 40 mg     Route: Subcutaneous    fluticasone  (FLONASE ) 50 mcg per spray nasal spray     Frequency: Daily PRN     Dose: 1 Spray     Route: Each Nostril    gabapentin  (NEURONTIN ) capsule  Frequency: 3x/day     Dose: 400 mg     Route: Oral    HYDROcodone -acetaminophen  (NORCO) 5-325 mg per tablet     Frequency: Q6H PRN     Dose: 1 Tablet     Route: Oral    ipratropium-albuterol  0.5 mg-3 mg(2.5 mg base)/3 mL Solution for Nebulization     Frequency: Q4H PRN     Dose: 3 mL     Route: Nebulization    losartan  (COZAAR ) tablet     Frequency: Daily     Dose: 25 mg     Route: Oral    magnesium  hydroxide  (MILK OF MAGNESIA) 400mg  per 5mL oral liquid     Frequency: 4x/day PRN     Dose: 15 mL     Route: Oral    metoprolol  tartrate (LOPRESSOR ) tablet     Frequency: 2x/day     Dose: 25 mg     Route: Oral    multivitamin-minerals-iron  oral liquid     Frequency: Daily     Dose: 15 mL     Route: Oral    NS 250 mL flush bag     Linked Order: And     Frequency: Q15 Min PRN     Route: Intravenous    NS flush syringe     Frequency: Q8HRS     Dose: 3 mL     Route: Intracatheter    NS flush syringe     Frequency: Q1H PRN     Dose: 3 mL     Route: Intracatheter    OLANZapine  (zyPREXA ) tablet     Frequency: NIGHTLY     Dose: 5 mg     Route: Oral    omega-3 fatty acids (LOVAZA ) capsule     Frequency: Daily     Dose: 1 g     Route: Oral    ondansetron  (ZOFRAN ) 2 mg/mL injection     Frequency: Q6H PRN     Dose: 4 mg     Route: Intravenous    pantoprazole  (PROTONIX ) delayed release tablet     Frequency: Daily     Dose: 40 mg     Route: Oral    polyethylene glycol (MIRALAX) oral packet     Frequency: Daily     Dose: 17 g     Route: Oral    prochlorperazine  (COMPAZINE ) tablet     Frequency: 3x/day PRN     Dose: 10 mg     Route: Oral    traZODone  (DESYREL ) tablet     Frequency: NIGHTLY     Dose: 150 mg     Route: Oral        Review of Systems:   All pertinent review of systems as addressed and detailed in HPI above    Vital Signs:  Temperature: 36.6 C (97.9 F)  Heart Rate: 76  BP (Non-Invasive): (!) 110/97  Respiratory Rate: 17  SpO2: 93 %      Physical Exam:  GENERAL:  [place] elderly female complaining of pain in the upper abdomen  SKIN: Warm and dry.  Does not appear jaundice  GI: Abdomen soft, diffusely tender in the upper abdomen midepigastrium with positive Murphy'sGU: No suprapubic tenderness. No costovertebral tenderness.  MUSCULOSKELETAL: Good muscle strength throughout.  EXTREMITIES: No pedal edema noted. Distal pulses equal bilaterally.  NEUROLOGICAL: Cranial nerves grossly intact. No gross focal  deficit.  PSYCHIATRIC: Appropriate mood and affect.     Studies:  I have reviewed the following studies within  the electronic medical record.  Ultrasound and CT scan of abdomen  Results for orders placed or performed during the hospital encounter of 07/08/24 (from the past 24 hours)   BASIC METABOLIC PANEL   Result Value Ref Range    SODIUM 145 (H) 133 - 144 mmol/L    POTASSIUM 3.6 3.2 - 5.0 mmol/L    CHLORIDE 105 96 - 106 mmol/L    CO2 TOTAL 26 22 - 30 mmol/L    ANION GAP 14 7 - 18 mmol/L    CALCIUM 8.8 8.3 - 10.7 mg/dL    GLUCOSE 899 74 - 890 mg/dL    BUN 20 8 - 23 mg/dL    CREATININE 9.49 9.49 - 0.90 mg/dL    BUN/CREA RATIO 40     ESTIMATED GFR >90 >90 mL/min/1.78m^2   CBC WITH DIFF   Result Value Ref Range    WBC 7.2 3.7 - 11.0 x10^3/uL    RBC 2.74 (L) 3.85 - 5.22 x10^6/uL    HGB 7.6 (L) 11.5 - 16.0 g/dL    HCT 75.1 (L) 65.1 - 46.0 %    MCV 90.5 78.0 - 100.0 fL    MCH 27.7 26.0 - 32.0 pg    MCHC 30.6 (L) 31.0 - 35.5 g/dL    RDW-CV 84.5 88.4 - 84.4 %    PLATELETS 49 (L) 150 - 400 x10^3/uL    MPV 10.8 8.7 - 12.5 fL   HEPATIC FUNCTION PANEL   Result Value Ref Range    ALBUMIN 2.9 (L) 3.5 - 5.2 g/dL    ALKALINE PHOSPHATASE 362 (H) 35 - 129 U/L    ALT (SGPT) 133 (H) 0 - 33 U/L    AST (SGOT) 331 (H) 0 - 32 U/L    BILIRUBIN TOTAL 0.8 0.2 - 1.2 mg/dL    BILIRUBIN DIRECT 0.5 (H) 0.0 - 0.3 mg/dL    PROTEIN TOTAL 5.7 (L) 6.4 - 8.3 g/dL   MANUAL DIFF AND MORPHOLOGY-SYSMEX   Result Value Ref Range    NEUTROPHIL % 72 %    LYMPHOCYTE %  18 %    MONOCYTE % 5 %    EOSINOPHIL % 0 %    BASOPHIL % 0 %    NEUTROPHIL BANDS % 2   %    REACTIVE LYMPHOCYTE % 3 %    NEUTROPHIL # 5.33 1.50 - 7.70 x10^3/uL    LYMPHOCYTE # 1.51 1.00 - 4.80 x10^3/uL    MONOCYTE # 0.36 0.20 - 1.10 x10^3/uL    EOSINOPHIL # <0.10 <=0.50 x10^3/uL    BASOPHIL # <0.10 <=0.20 x10^3/uL    NRBC FROM MANUAL DIFF 3 (H) <=1 per 100 WBC    ANISOCYTOSIS 2+/Moderate (A) None     CT ANGIO CHEST FOR PULMONARY EMBOLUS W IV CONTRAST  Result Date: 07/08/2024  Impression 1. No  evidence of pulmonary embolism. 2. Marginal increase in the size of the right infrahilar mass, right hilar and mediastinal lymphadenopathy compared to the prior study. 3. Other chronic findings as described above. Radiologist location ID: [id number]     CT ABDOMEN PELVIS W IV CONTRAST  Result Date: 07/08/2024  Impression 1. Hepatomegaly, hepatic steatosis and hepatic lesions, not greatly changed and new pericholecystic fluid now present. 2. Multiple nonobstructing bilateral renal calculi. 3. New free fluid in the pelvis. Radiologist location ID: [id number]        Dempsey JAYSON Decant, MD           [1] No Known Allergies  [2]   Social  History  Tobacco Use    Smoking status: Every Day     Current packs/day: 0.50     Average packs/day: 0.5 packs/day for 52.7 years (26.4 ttl pk-yrs)     Types: Cigarettes     Start date: 1973    Smokeless tobacco: Never   Vaping Use    Vaping status: Never Used   Substance Use Topics    Alcohol use: Yes     Comment: occasional    Drug use: Yes     Types: Marijuana

## 2024-07-10 NOTE — Assessment & Plan Note (Signed)
 Rate controlled, continue metoprolol , not on anticoagulation

## 2024-07-10 NOTE — Assessment & Plan Note (Signed)
 Continue trazodone

## 2024-07-10 NOTE — Assessment & Plan Note (Signed)
 Patient seems to have symptomatic anemia, hemoglobin dropped from 11.8-9.3 and today 7.7   Likely secondary to acute illness or metastatic disease  Follow up on iron -deficiency panel, nutritional deficiencies.  Transfuse for hemoglobin less than 7

## 2024-07-10 NOTE — Assessment & Plan Note (Signed)
Continue losartan, metoprolol.

## 2024-07-10 NOTE — Assessment & Plan Note (Signed)
 Likely secondary to liver metastasis

## 2024-07-10 NOTE — Assessment & Plan Note (Signed)
S/p mastectomy 

## 2024-07-10 NOTE — Consults (Signed)
 [facility]  Hematology/Oncology    Consult progress Note      [name], [name], 68 y.o. female  Date of Birth:  [birth year]  Inpatient Admission Date:  07/08/2024  Date of Service: 07/10/2024    Chief Complaint:  Known patient - history of small-cell lung CA with liver metastases    Interim HPI:  Resting in bed.  Reports feeling okay this morning.  No acute events reported overnight.  General surgery following for cholelithiasis.  Tentative surgery plans for cholecystectomy this admission.    HPI:  This is a 68 year old female who presented outpatient infusion center today for Tecentriq/etoposide/carbo treatment when she reported worsening dyspnea to nursing staff.  She was found to be 89% on room air.  Was then placed on 2 L nasal cannula O2 which increased her to 94% oxygenation.  Also c/o worsening generalized pain that is worse in the legs as well as productive cough with yellow sputum.  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    Review of Systems:   Review of Systems   Constitutional:  Positive for fatigue. Negative for activity change, appetite change, chills, diaphoresis, fever and unexpected weight change.   HENT:  Negative for congestion, ear pain, facial swelling, hearing loss, mouth sores, nosebleeds, sinus pain, sore throat, tinnitus, trouble swallowing and voice change.    Eyes:  Negative for photophobia, pain and visual disturbance.   Respiratory:  Positive for cough and shortness of breath. Negative for apnea, choking, chest tightness, wheezing and stridor.    Cardiovascular:  Negative for chest pain, palpitations and leg swelling.   Gastrointestinal:  Negative for abdominal distention, abdominal pain, anal bleeding, blood in stool, constipation, diarrhea, nausea, rectal pain and vomiting.   Endocrine: Negative for cold intolerance and heat intolerance.   Genitourinary:  Negative for decreased urine volume,  difficulty urinating, dysuria, flank pain, frequency, hematuria and urgency.   Musculoskeletal:  Positive for arthralgias and myalgias. Negative for back pain, gait problem, joint swelling and neck pain.   Skin:  Negative for color change, pallor, rash and wound.   Allergic/Immunologic: Negative for immunocompromised state.   Neurological:  Negative for dizziness, syncope, facial asymmetry, speech difficulty, weakness, light-headedness, numbness and headaches.   Hematological:  Negative for adenopathy. Does not bruise/bleed easily.   Psychiatric/Behavioral:  Negative for agitation, behavioral problems and confusion.      Past Medical History:   Diagnosis Date    Abdominal pain     Cancer (CMS HCC)     Chest pain     Chronic lung disease     Coronary artery disease     Dyslipidemia     Essential hypertension     Heart disease     History of percutaneous coronary intervention     History of stress test     Mixed hyperlipidemia     Unspecified chronic bronchitis      Past Surgical History:   Procedure Laterality Date    CARDIAC CATHETERIZATION      CORONARY ANGIOPLASTY      CORONARY ARTERY STENT PLACEMENT      HX BACK SURGERY      HX CORONARY ARTERY BYPASS GRAFT      HX MASTECTOMY, SIMPLE Bilateral     HX TUBAL LIGATION      NECK SURGERY       Social History     Socioeconomic History    Marital  status: Widowed     Spouse name: Not on file    Number of children: Not on file    Years of education: Not on file    Highest education level: Not on file   Occupational History    Not on file   Tobacco Use    Smoking status: Every Day     Current packs/day: 0.50     Average packs/day: 0.5 packs/day for 52.7 years (26.4 ttl pk-yrs)     Types: Cigarettes     Start date: 22    Smokeless tobacco: Never   Vaping Use    Vaping status: Never Used   Substance and Sexual Activity    Alcohol use: Yes     Comment: occasional    Drug use: Yes     Types: Marijuana    Sexual activity: Not on file   Other Topics Concern    Not on file    Social History Narrative    Not on file     Social Determinants of Health     Financial Resource Strain: Not on file   Transportation Needs: Not on file   Social Connections: Low Risk (07/09/2024)    Social Connections     SDOH Social Isolation: 5 or more times a week   Intimate Partner Violence: Not on file   Housing Stability: Not on file      Family Medical History:       Problem Relation (Age of Onset)    Arthritis Mother, Maternal Grandmother    Cancer Father, Other    Heart Attack Father, Other    Heart Disease Brother, Other    Hypertension (High Blood Pressure) Mother, Father, Other    Pacemaker Mother          Vital Signs:  Temp  Avg: 36.7 C (98.1 F)  Min: 36.6 C (97.9 F)  Max: 36.8 C (98.2 F)    Pulse  Avg: 84.3  Min: 76  Max: 91 BP  Min: 110/97  Max: 157/91   Resp  Avg: 17.7  Min: 16  Max: 22 SpO2  Avg: 93.7 %  Min: 88 %  Max: 97 %          Input/Output    Intake/Output Summary (Last 24 hours) at 07/10/2024 1324  Last data filed at 07/09/2024 2000  Gross per 24 hour   Intake 56 ml   Output --   Net 56 ml    I/O last shift:  No intake/output data recorded.     LABS:   Results for orders placed or performed during the hospital encounter of 07/08/24 (from the past 24 hours)   BASIC METABOLIC PANEL   Result Value Ref Range    SODIUM 145 (H) 133 - 144 mmol/L    POTASSIUM 3.6 3.2 - 5.0 mmol/L    CHLORIDE 105 96 - 106 mmol/L    CO2 TOTAL 26 22 - 30 mmol/L    ANION GAP 14 7 - 18 mmol/L    CALCIUM 8.8 8.3 - 10.7 mg/dL    GLUCOSE 899 74 - 890 mg/dL    BUN 20 8 - 23 mg/dL    CREATININE 9.49 9.49 - 0.90 mg/dL    BUN/CREA RATIO 40     ESTIMATED GFR >90 >90 mL/min/1.64m^2   CBC WITH DIFF   Result Value Ref Range    WBC 7.2 3.7 - 11.0 x10^3/uL    RBC 2.74 (L) 3.85 - 5.22 x10^6/uL    HGB 7.6 (  L) 11.5 - 16.0 g/dL    HCT 75.1 (L) 65.1 - 46.0 %    MCV 90.5 78.0 - 100.0 fL    MCH 27.7 26.0 - 32.0 pg    MCHC 30.6 (L) 31.0 - 35.5 g/dL    RDW-CV 84.5 88.4 - 84.4 %    PLATELETS 49 (L) 150 - 400 x10^3/uL    MPV 10.8 8.7 -  12.5 fL   HEPATIC FUNCTION PANEL   Result Value Ref Range    ALBUMIN 2.9 (L) 3.5 - 5.2 g/dL    ALKALINE PHOSPHATASE 362 (H) 35 - 129 U/L    ALT (SGPT) 133 (H) 0 - 33 U/L    AST (SGOT) 331 (H) 0 - 32 U/L    BILIRUBIN TOTAL 0.8 0.2 - 1.2 mg/dL    BILIRUBIN DIRECT 0.5 (H) 0.0 - 0.3 mg/dL    PROTEIN TOTAL 5.7 (L) 6.4 - 8.3 g/dL   MANUAL DIFF AND MORPHOLOGY-SYSMEX   Result Value Ref Range    NEUTROPHIL % 72 %    LYMPHOCYTE %  18 %    MONOCYTE % 5 %    EOSINOPHIL % 0 %    BASOPHIL % 0 %    NEUTROPHIL BANDS % 2   %    REACTIVE LYMPHOCYTE % 3 %    NEUTROPHIL # 5.33 1.50 - 7.70 x10^3/uL    LYMPHOCYTE # 1.51 1.00 - 4.80 x10^3/uL    MONOCYTE # 0.36 0.20 - 1.10 x10^3/uL    EOSINOPHIL # <0.10 <=0.50 x10^3/uL    BASOPHIL # <0.10 <=0.20 x10^3/uL    NRBC FROM MANUAL DIFF 3 (H) <=1 per 100 WBC    ANISOCYTOSIS 2+/Moderate (A) None     IMAGES:  Results for orders placed or performed during the hospital encounter of 07/08/24 (from the past 72 hours)   XR AP MOBILE CHEST     Status: None    Narrative    [name]    XR AP MOBILE CHEST performed on 07/08/2024 10:48 AM.    INDICATION:  Chest Pain   Additional History:  chest pain    TECHNIQUE:  Single portable frontal view of the chest.  1 views/1 images submitted for interpretation.    COMPARISON:  Chest x-ray 06/17/2024  ___________________________________  FINDINGS:    Median sternotomy noted. Heart size and vascularity are similar. Right hilar adenopathy. Heart size normal.  Vague groundglass opacities at both lung bases and minimally right upper lobe atelectasis or minimal pneumonitis. No pleural effusion. No pneumothorax. Dextrocurvature thoracic spine.  ___________________________________    Impression    1. Right hilar adenopathy.    2. Vague pulmonary opacities could be mild pneumonia or atelectasis.        Radiologist location ID: [id number]     CT ABDOMEN PELVIS W IV CONTRAST     Status: None    Narrative    [name]    CT ABDOMEN PELVIS W IV CONTRAST performed on  07/08/2024 1:18 PM.    INDICATION:  abd pain   Additional History:  abdomen pain, nausea, vomiting    TECHNIQUE:  CT abdomen and pelvis with axial, coronal, and sagittal multiplanar reformations, performed with contrast. Dose modulation, automated exposure control, and/or iterative reconstruction were used for dose reduction.    CONTRAST:  100 mL Isovue  370 IV    COMPARISON:  Abdomen CT 06/14/2024  ___________________________________  FINDINGS:    LOWER THORAX: Unremarkable.    HEPATOBILIARY:  Hepatomegaly and hepatic steatosis. Scattered hypodense  lesions in the liver redemonstrated not greatly changed and the short-term interval.  Pericholecystic fluid now present, gallbladder slightly contracted.  No intrahepatic or extrahepatic ductal dilation.  PANCREAS: No focal masses or ductal dilatation.  SPLEEN: No splenomegaly.    ADRENALS: No adrenal nodules.  KIDNEYS/URETERS/BLADDER: No hydronephrosis,. Bilateral renal cyst and multiple bilateral renal calculi redemonstrated.  REPRODUCTIVE: No significant findings.    PERITONEUM / RETROPERITONEUM: New free fluid in the dependent pelvis.  LYMPH NODES: No lymphadenopathy. Small retroperitoneal are unchanged. Lymph nodes  VESSELS: Abdominal aorta is normal in caliber. Severe atherosclerotic disease aorta.    GI TRACT: No distention, wall thickening, or inflammatory stranding.  Appendix is normal. Colon diverticulosis. ABDOMINAL WALL:  Intact.    SOFT TISSUES: Unremarkable.  BONES: Degenerative changes are present in the spine  ___________________________________    Impression    1. Hepatomegaly, hepatic steatosis and hepatic lesions, not greatly changed and new pericholecystic fluid now present.  2. Multiple nonobstructing bilateral renal calculi.  3. New free fluid in the pelvis.      Radiologist location ID: [id number]     CT ANGIO CHEST FOR PULMONARY EMBOLUS W IV CONTRAST     Status: None    Narrative    [name]    CT ANGIO CHEST FOR PULMONARY EMBOLUS W IV  CONTRAST performed on 07/08/2024 4:34 PM.    INDICATION:  PE   Additional History:  Dyspnea, chest pain, history of small cell lung cancer, coronary stent, and CABG.    TECHNIQUE:  CT angiography of the chest performed for evaluation of the pulmonary arteries, with axial, sagittal, and coronal multiplanar reformations and a coronal 3D MIP.  Dose modulation, automated exposure control, and/or iterative reconstruction were used for dose reduction.    CONTRAST: 75 mL of Isovue  370 IV    COMPARISON:  June 14, 2024  ___________________________________  FINDINGS:    HEART / GREAT VESSELS: There are no filling defects in the pulmonary arteries to suggest a pulmonary embolism.  The heart is normal in size without pericardial effusion.  The thoracic aorta is unremarkable.     LUNGS / AIRWAYS: Focal area of scarring is noted in the right upper lobe. No significant interval change compared to the prior study. Right infrahilar masslike lesion is again noted, demonstrates marginal increase in size compared to the prior study now measuring approximately 3.8 x 3.3 cm. Interlobular emphysematous changes are noted bilaterally. No lobar consolidation, pleural effusions or pneumothorax.  There is narrowing of the right mainstem and right segmental and proximal subsegmental bronchioles.. There are no suspicious pulmonary nodules.    MEDIASTINUM: There is right supraclavicular lymphadenopathy, measuring 2.2 cm, marginally increased in size compared to the prior study. Few scattered right supraclavicular lymph nodes are unchanged. Marginal increase in the size of the right paratracheal conglomerate lymph nodal mass now measuring 2.3 cm (previously 1.9 cm. Marginal increase in the right hilar and infrahilar lymphadenopathy. The subcarinal lymph node measures 3 cm.    UPPER ABDOMEN: Few scattered left para-aortic lymph nodes are noted, not significant by size criteria. These are partially visualized on the current study. Previously  noted liver lesions are not well identified on the current study due to the phase of IV contrast.    BONES:  ACDF of the lower cervical spine. Median sternotomy and mediastinal clips are noted.    OTHER:  The visualized thyroid gland is unremarkable.  ___________________________________    Impression    1. No evidence of pulmonary embolism.  2. Marginal increase in the size of the right infrahilar mass, right hilar and mediastinal lymphadenopathy compared to the prior study.  3. Other chronic findings as described above.        Radiologist location ID: [id number]     US  RT UPPER QUADRANT     Status: None    Narrative    [name]    US  RT UPPER QUADRANT performed on 07/09/2024 2:47 PM.    INDICATION:  liver mets, worsening elevated lfts, pericholecystic fluid   Additional History:  liver mets, worsening elevated lfts, pericholecystic fluid    TECHNIQUE:  Transabdominal grayscale sonogram of the right upper quadrant, with color Doppler imaging.      COMPARISON:  06/14/2024  ___________________________________  FINDINGS:    The visualized pancreas is unremarkable; the tail is obscured.     The liver measures 21.4 along its right lateral margin and demonstrates diffuse increased hepatic echotexture.  No focal lesions are seen.  The portal vein is patent, with appropriate direction of flow on color Doppler and waveform imaging.    The common bile duct measures up to 7.6 mm in caliber, and there is no intrahepatic ductal dilation.    Gallbladder wall thickening with pericholecystic fluid.       GB wall thickness: 3.6    The right kidney measures 12.7 x 5.4 x 4.5, without hydronephrosis.    Trace ascites.  ___________________________________    Impression    Gallbladder wall thickening with pericholecystic fluid consistent with acute cholecystitis.    Dilated common bile duct without distal obstructing lesion identified.    Hepatomegaly with hepatic steatosis.    Trace ascites.          Radiologist location ID:  [id number]     MRI MRCP WO CONTRAST     Status: None (Preliminary result)    Narrative    [name]    MRI MRCP WO CONTRAST performed on 07/10/2024 12:54 PM.    INDICATION:  acute chole on imaging, cbd 7 mm, elevated lfts, r/o stone   Additional History:  Acute chole on imaging, cbd 7 mm, elevated lfts, rule out stone. Stomach pain.    Multiplanar, multisequence MRI performed without contrast.  3-D MIP images obtained.    * Diffuse nodular foci of increased T2 signal throughout the enlarged liver.  * Portal and hepatic veins appear to be patent.  * Small amount of perihepatic fluid.  * No gallbladder wall thickening or stones. Pericholecystic fluid is present.  * Mild extrahepatic bile duct dilatation, measuring 9 mm coronal image 44 series 7.  * No pancreatic mass or pancreatic duct dilatation.  * Spleen is normal in size.  * Adrenal glands unremarkable.  * Several small T2 hyperintense renal lesions, most compatible with cysts.    * Heterogeneous T2 signal within the marrow, clinical assessment for anemia, there is scoliosis with degenerative disease.      Impression    * IMPRESSION:    * Mild extra hepatic bile duct dilatation as noted on CT 07/08/2024, no bile duct stone.    * No gallstones, there is pericholecystic fluid as well as fluid adjacent to the liver.    * Diffuse heterogeneous appearance to the liver as described above, this could certainly represent findings associated with hepatitis, differential considerations include regenerative nodularity in chronic liver disease, metastatic disease/lymphoma, and possibly sarcoid          Radiologist location ID: [id number]  Physical Exam:  Physical Exam  Vitals reviewed.   Constitutional:       Appearance: She is normal weight. She is ill-appearing.   HENT:      Head: Normocephalic.      Nose: Nose normal.      Mouth/Throat:      Pharynx: Oropharynx is clear.   Eyes:      Extraocular Movements: Extraocular movements intact.       Conjunctiva/sclera: Conjunctivae normal.   Cardiovascular:      Rate and Rhythm: Normal rate.      Pulses: Normal pulses.   Pulmonary:      Effort: Pulmonary effort is normal.   Abdominal:      Palpations: Abdomen is soft.   Musculoskeletal:         General: Normal range of motion.      Cervical back: Normal range of motion.   Skin:     General: Skin is dry.   Neurological:      General: No focal deficit present.      Mental Status: She is alert and oriented to person, place, and time. Mental status is at baseline.   Psychiatric:         Mood and Affect: Mood normal.         Behavior: Behavior normal.         Thought Content: Thought content normal.         Judgment: Judgment normal.       Problem List:  Active Hospital Problems   (*Primary Problem)    Diagnosis    *Acute hypoxic respiratory failure (CMS HCC)    Thickening of wall of gallbladder with pericholecystic fluid    Symptomatic anemia    COVID-19    Normocytic anemia    Transaminitis    Malignant neoplasm of lung, unspecified laterality, unspecified part of lung (CMS HCC)    Pulmonary HTN (CMS HCC)    Pneumonia due to COVID-19 virus    Mood disorder (CMS HCC)    Anemia, unspecified type    Cigarette smoker    Metastatic cancer to liver    History of breast cancer     Chronic    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)    Paroxysmal atrial fibrillation (CMS HCC)     Chronic    Hypertension     Chronic    CAD (coronary artery disease)      Assessment/Plan:    68 year old female presenting with acute hypoxic respiratory failure:    Acute hypoxic respiratory failure:  CXR revealing vague pulmonary opacities that could be mild PNA or atelectasis.  She is COVID positive.  CTA chest to rule out PE to be completed.  She is on IV antibiotics.    Small-cell lung CA with liver metastasis:  Follows with Medical Oncology on outpatient basis at [facility].  She was to receive carboplatin/etoposide/Tecentriq on 07/08/2024 when she presented with  acute respiratory symptoms that prompted further evaluation by the emergency room as stated in the above HPI.  Withhold current chemotherapy regimen until resolution of acute conditions.    Normocytic hypochromic anemia:   Current H/H = 7.6/24.8.  No acute blood loss noted.  Could be secondary to acute illness or metastatic disease.  Vitamin B12 normal at 1106.  Folate low at 3.5.  Started on daily folic acid  if not already taking.  Copper pending.  Noted transaminitis as well as elevated alk-phos.  This is likely  related to liver mets.  Continue to monitor counts.  Transfuse with PRBC if hemoglobin less than 7 grams/deciliter or greater than 7 and symptomatic.  Will benefit from blood transfusion.    Thrombocytopenia:  Current counts = 49,000.  No overt bruising or bleeding.  Likely secondary to underlying metastatic disease.  Defer further thrombocytopenia panel at this time.  Continue to monitor counts.  Transfuse with platelets if <50 K and actively bleeding or <10 K w/wo bleeding.    Cholelithiasis: General surgery is following.  Tentative plans for cholecystectomy this admission.    Hypertension: Managed with antihypertensives.    HLD:  On statin.    CAD:  History of CABG x1.  On cardioprotective medication, 81 mg ASA daily.

## 2024-07-10 NOTE — Assessment & Plan Note (Signed)
 Acute hypoxic respiratory failure currently on 2 L nasal cannula.  Significantly desaturating when ambulating   CT angiogram of the chest that has not show any evidence pulmonary embolism although marginal increase in the size of infrahilar mass, right hilar and mediastinal lymphadenopathy.  Chest x-ray showed vague opacities in the that could possibly be mild pneumoniae was atelectasis   Empirically being treated steroids and antibiotics for community-acquired pneumonia  Viral panel positive for COVID   . We'll get a Hemoccult card as well on the possibility that this is truly anemia and monitor hemoglobins. Of note, this could also be worsening progression of the patient's known small cell carcinoma of the lungs. We'll consult the primary oncologist

## 2024-07-10 NOTE — Assessment & Plan Note (Signed)
 Worsening metastatic liver disease likely causing elevated transaminases

## 2024-07-10 NOTE — Assessment & Plan Note (Signed)
 Positive on viral panel with new oxygen requirement  Treat with DuoNebs and Decadron   Empirically treating with antibiotics for probable community-acquired pneumonia

## 2024-07-10 DEATH — deceased

## 2024-07-11 ENCOUNTER — Encounter (HOSPITAL_COMMUNITY)
Admission: EM | Disposition: E | Payer: Self-pay | Source: Ambulatory Visit | Attending: Student in an Organized Health Care Education/Training Program

## 2024-07-11 ENCOUNTER — Inpatient Hospital Stay (HOSPITAL_COMMUNITY)

## 2024-07-11 DIAGNOSIS — K810 Acute cholecystitis: Secondary | ICD-10-CM | POA: Insufficient documentation

## 2024-07-11 DIAGNOSIS — K812 Acute cholecystitis with chronic cholecystitis: Secondary | ICD-10-CM

## 2024-07-11 LAB — COPPER, SERUM: COPPER: 113 ug/dL (ref 70–175)

## 2024-07-11 LAB — FUNGUS CULTURE: FUNGAL CULTURE: NO GROWTH — AB

## 2024-07-11 SURGERY — ROBOTIC CHOLECYSTECTOMY LAPAROSCOPIC
Anesthesia: General | Site: Abdomen | Laterality: Right | Wound class: Clean Wound: Uninfected operative wounds in which no inflammation occurred

## 2024-07-11 MED ORDER — FAMOTIDINE 20 MG TABLET
ORAL_TABLET | ORAL | Status: AC
Start: 2024-07-11 — End: 2024-07-11
  Filled 2024-07-11: qty 1

## 2024-07-11 MED ORDER — BUPIVACAINE HCL 0.25 % (2.5 MG/ML) INJECTION SOLUTION
INTRAMUSCULAR | Status: AC
Start: 2024-07-11 — End: 2024-07-11
  Filled 2024-07-11: qty 50

## 2024-07-11 MED ORDER — INDOCYANINE GREEN 25 MG SOLUTION FOR INJECTION
INTRAMUSCULAR | Status: AC
Start: 2024-07-11 — End: 2024-07-11
  Filled 2024-07-11: qty 25

## 2024-07-11 MED ORDER — LIDOCAINE (PF) 100 MG/5 ML (2 %) INTRAVENOUS SYRINGE
INJECTION | Freq: Once | INTRAVENOUS | Status: DC | PRN
Start: 2024-07-11 — End: 2024-07-11
  Administered 2024-07-11: 100 mg via INTRAVENOUS

## 2024-07-11 MED ORDER — MAGNESIUM SULFATE 2 GRAM/50 ML (4 %) IN WATER INTRAVENOUS PIGGYBACK
INJECTION | Freq: Once | INTRAVENOUS | Status: DC | PRN
Start: 2024-07-11 — End: 2024-07-11
  Administered 2024-07-11: 2 g via INTRAVENOUS

## 2024-07-11 MED ORDER — SUGAMMADEX 100 MG/ML INTRAVENOUS SOLUTION
Freq: Once | INTRAVENOUS | Status: DC | PRN
Start: 2024-07-11 — End: 2024-07-11
  Administered 2024-07-11: 200 mg via INTRAVENOUS

## 2024-07-11 MED ORDER — ONDANSETRON 4 MG DISINTEGRATING TABLET
ORAL_TABLET | ORAL | Status: AC
Start: 2024-07-11 — End: 2024-07-11
  Filled 2024-07-11: qty 1

## 2024-07-11 MED ORDER — ALBUTEROL SULFATE 2.5 MG/3 ML (0.083 %) SOLUTION FOR NEBULIZATION
2.5000 mg | INHALATION_SOLUTION | Freq: Once | RESPIRATORY_TRACT | Status: DC | PRN
Start: 2024-07-11 — End: 2024-07-11

## 2024-07-11 MED ORDER — DEXAMETHASONE SODIUM PHOSPHATE 4 MG/ML INJECTION SOLUTION
Freq: Once | INTRAMUSCULAR | Status: DC | PRN
Start: 2024-07-11 — End: 2024-07-11
  Administered 2024-07-11: 10 mg via INTRAVENOUS

## 2024-07-11 MED ORDER — HYDROMORPHONE 0.5 MG/0.5 ML INJECTION SYRINGE
0.2500 mg | INJECTION | INTRAMUSCULAR | Status: DC | PRN
Start: 2024-07-11 — End: 2024-07-11

## 2024-07-11 MED ORDER — SODIUM CHLORIDE 0.9% FLUSH BAG - 250 ML
INTRAVENOUS | Status: DC | PRN
Start: 2024-07-11 — End: 2024-07-11

## 2024-07-11 MED ORDER — APREPITANT 40 MG CAPSULE
40.0000 mg | ORAL_CAPSULE | Freq: Once | ORAL | Status: AC
Start: 2024-07-11 — End: 2024-07-11
  Administered 2024-07-11: 40 mg via ORAL

## 2024-07-11 MED ORDER — PROCHLORPERAZINE EDISYLATE 10 MG/2 ML (5 MG/ML) INJECTION SOLUTION
5.0000 mg | Freq: Once | INTRAMUSCULAR | Status: DC | PRN
Start: 2024-07-11 — End: 2024-07-11

## 2024-07-11 MED ORDER — INDOCYANINE GREEN 25 MG SOLUTION FOR INJECTION
Freq: Once | INTRAMUSCULAR | Status: DC | PRN
Start: 2024-07-11 — End: 2024-07-11
  Administered 2024-07-11: 7.5 mg via INTRAVENOUS

## 2024-07-11 MED ORDER — POVIDONE-IODINE 10 % TOPICAL SWAB
Freq: Once | CUTANEOUS | Status: DC
Start: 2024-07-11 — End: 2024-07-11

## 2024-07-11 MED ORDER — GABAPENTIN 300 MG CAPSULE
300.0000 mg | ORAL_CAPSULE | Freq: Once | ORAL | Status: AC
Start: 2024-07-11 — End: 2024-07-11
  Administered 2024-07-11: 300 mg via ORAL

## 2024-07-11 MED ORDER — LACTATED RINGERS INTRAVENOUS SOLUTION
INTRAVENOUS | Status: DC
Start: 2024-07-11 — End: 2024-07-11

## 2024-07-11 MED ORDER — SODIUM CHLORIDE 0.9 % (FLUSH) INJECTION SYRINGE
3.0000 mL | INJECTION | INTRAMUSCULAR | Status: DC | PRN
Start: 2024-07-11 — End: 2024-07-11

## 2024-07-11 MED ORDER — MIDAZOLAM 1 MG/ML INJECTION WRAPPER
INTRAMUSCULAR | Status: AC
Start: 2024-07-11 — End: 2024-07-11
  Filled 2024-07-11: qty 2

## 2024-07-11 MED ORDER — APREPITANT 40 MG CAPSULE
ORAL_CAPSULE | ORAL | Status: AC
Start: 2024-07-11 — End: 2024-07-11
  Filled 2024-07-11: qty 1

## 2024-07-11 MED ORDER — FENTANYL (PF) 50 MCG/ML INJECTION WRAPPER
25.0000 ug | INJECTION | INTRAMUSCULAR | Status: DC | PRN
Start: 2024-07-11 — End: 2024-07-11

## 2024-07-11 MED ORDER — HYDROMORPHONE 1 MG/ML INJECTION WRAPPER
0.5000 mg | INJECTION | INTRAMUSCULAR | Status: DC | PRN
Start: 2024-07-11 — End: 2024-07-11

## 2024-07-11 MED ORDER — EPHEDRINE SULFATE 50 MG/ML INTRAVENOUS SOLUTION
Freq: Once | INTRAVENOUS | Status: DC | PRN
Start: 2024-07-11 — End: 2024-07-11
  Administered 2024-07-11: 10 mg via INTRAVENOUS

## 2024-07-11 MED ORDER — ACETAMINOPHEN 1,000 MG/100 ML (10 MG/ML) INTRAVENOUS SOLUTION
1000.0000 mg | Freq: Once | INTRAVENOUS | Status: DC
Start: 2024-07-11 — End: 2024-07-11

## 2024-07-11 MED ORDER — FENTANYL (PF) 50 MCG/ML INJECTION WRAPPER
INJECTION | Freq: Once | INTRAMUSCULAR | Status: DC | PRN
Start: 2024-07-11 — End: 2024-07-11
  Administered 2024-07-11 (×2): 50 ug via INTRAVENOUS

## 2024-07-11 MED ORDER — CEFOXITIN 2 GRAM/50 ML IN DEXTROSE(ISO-OSMOTIC) INTRAVENOUS PIGGYBACK
INJECTION | INTRAVENOUS | Status: AC
Start: 2024-07-11 — End: 2024-07-11
  Filled 2024-07-11: qty 50

## 2024-07-11 MED ORDER — DEXTROSE 5% IN WATER (D5W) FLUSH BAG - 250 ML
INTRAVENOUS | Status: DC | PRN
Start: 2024-07-11 — End: 2024-07-11

## 2024-07-11 MED ORDER — BUPIVACAINE HCL 0.25 % (2.5 MG/ML) INJECTION SOLUTION
Freq: Once | INTRAMUSCULAR | Status: DC | PRN
Start: 2024-07-11 — End: 2024-07-11
  Administered 2024-07-11: 50 mL via INTRAMUSCULAR

## 2024-07-11 MED ORDER — FAMOTIDINE 20 MG TABLET
20.0000 mg | ORAL_TABLET | Freq: Once | ORAL | Status: AC
Start: 2024-07-11 — End: 2024-07-11
  Administered 2024-07-11: 20 mg via ORAL

## 2024-07-11 MED ORDER — PROPOFOL 10 MG/ML IV BOLUS
INJECTION | Freq: Once | INTRAVENOUS | Status: DC | PRN
Start: 2024-07-11 — End: 2024-07-11
  Administered 2024-07-11: 150 mg via INTRAVENOUS

## 2024-07-11 MED ORDER — SODIUM CHLORIDE 0.9 % (FLUSH) INJECTION SYRINGE
3.0000 mL | INJECTION | Freq: Three times a day (TID) | INTRAMUSCULAR | Status: DC
Start: 2024-07-11 — End: 2024-07-11

## 2024-07-11 MED ORDER — ONDANSETRON HCL (PF) 4 MG/2 ML INJECTION SOLUTION
Freq: Once | INTRAMUSCULAR | Status: DC | PRN
Start: 2024-07-11 — End: 2024-07-11
  Administered 2024-07-11: 4 mg via INTRAVENOUS

## 2024-07-11 MED ORDER — ONDANSETRON 4 MG DISINTEGRATING TABLET
4.0000 mg | ORAL_TABLET | Freq: Once | ORAL | Status: AC
Start: 2024-07-11 — End: 2024-07-11
  Administered 2024-07-11: 4 mg via ORAL

## 2024-07-11 MED ORDER — ALBUTEROL SULFATE 2.5 MG/3 ML (0.083 %) SOLUTION FOR NEBULIZATION
INHALATION_SOLUTION | RESPIRATORY_TRACT | Status: AC
Start: 2024-07-11 — End: 2024-07-11
  Filled 2024-07-11: qty 3

## 2024-07-11 MED ORDER — GABAPENTIN 300 MG CAPSULE
ORAL_CAPSULE | ORAL | Status: AC
Start: 2024-07-11 — End: 2024-07-11
  Filled 2024-07-11: qty 1

## 2024-07-11 MED ORDER — MAGNESIUM SULFATE 2 GRAM/50 ML (4 %) IN WATER INTRAVENOUS PIGGYBACK
INJECTION | INTRAVENOUS | Status: AC
Start: 2024-07-11 — End: 2024-07-11
  Filled 2024-07-11: qty 50

## 2024-07-11 MED ORDER — SUGAMMADEX 100 MG/ML INTRAVENOUS SOLUTION
INTRAVENOUS | Status: AC
Start: 2024-07-11 — End: 2024-07-11
  Filled 2024-07-11: qty 2

## 2024-07-11 MED ORDER — IPRATROPIUM 0.5 MG-ALBUTEROL 3 MG (2.5 MG BASE)/3 ML NEBULIZATION SOLN
3.0000 mL | INHALATION_SOLUTION | Freq: Once | RESPIRATORY_TRACT | Status: DC | PRN
Start: 2024-07-11 — End: 2024-07-11

## 2024-07-11 MED ORDER — ONDANSETRON HCL (PF) 4 MG/2 ML INJECTION SOLUTION
4.0000 mg | Freq: Once | INTRAMUSCULAR | Status: DC | PRN
Start: 2024-07-11 — End: 2024-07-11

## 2024-07-11 MED ORDER — MIDAZOLAM 1 MG/ML INJECTION WRAPPER
Freq: Once | INTRAMUSCULAR | Status: DC | PRN
Start: 2024-07-11 — End: 2024-07-11
  Administered 2024-07-11: 2 mg via INTRAVENOUS

## 2024-07-11 MED ORDER — LACTATED RINGERS INTRAVENOUS SOLUTION
INTRAVENOUS | Status: DC | PRN
Start: 2024-07-11 — End: 2024-07-11
  Administered 2024-07-11: 0 via INTRAVENOUS

## 2024-07-11 MED ORDER — ROCURONIUM 10 MG/ML INTRAVENOUS SOLUTION
Freq: Once | INTRAVENOUS | Status: DC | PRN
Start: 2024-07-11 — End: 2024-07-11
  Administered 2024-07-11: 50 mg via INTRAVENOUS

## 2024-07-11 MED ORDER — CEFOXITIN 2 GRAM/50 ML IN DEXTROSE(ISO-OSMOTIC) INTRAVENOUS PIGGYBACK
INJECTION | Freq: Once | INTRAVENOUS | Status: DC | PRN
Start: 2024-07-11 — End: 2024-07-11
  Administered 2024-07-11: 2 g via INTRAVENOUS

## 2024-07-11 MED ORDER — FENTANYL (PF) 50 MCG/ML INJECTION WRAPPER
50.0000 ug | INJECTION | Freq: Once | INTRAMUSCULAR | Status: DC
Start: 2024-07-11 — End: 2024-07-11

## 2024-07-11 MED ORDER — FENTANYL (PF) 50 MCG/ML INJECTION SOLUTION
INTRAMUSCULAR | Status: AC
Start: 2024-07-11 — End: 2024-07-11
  Filled 2024-07-11: qty 4

## 2024-07-11 MED ORDER — LACTATED RINGERS INTRAVENOUS SOLUTION
INTRAVENOUS | Status: AC
Start: 2024-07-11 — End: 2024-07-12
  Administered 2024-07-11 – 2024-07-12 (×2): 0 mL via INTRAVENOUS

## 2024-07-11 SURGICAL SUPPLY — 20 items
APPL 70% ISPRP 2% CHG 3ML 5X4IN CHLRPRP HI-LT ORNG PREP STRL LF  DISP CLR (MED SURG SUPPLIES) ×1 IMPLANT
CLIP MED LRG INTERNAL WECK HEMOLOK PLMR LGT NONAB CART STRL DISP LF  GRN (SUTURE AIDS) ×2 IMPLANT
CONV USE ITEM 156524 - ADHESIVE TISSUE EXOFIN 1.0ML_PREMIERPRO EXOFIN (SUTURE/WOUND CLOSURE) ×1 IMPLANT
DRAPE ARM 21X19X10.5IN DAVINCI XI EQP 21LB (DRAPE/PACKS/SHEETS/OR TOWEL) ×4 IMPLANT
DRAPE CLMN DAVINCI XI EQP (DRAPE/PACKS/SHEETS/OR TOWEL) ×1 IMPLANT
GLOVE SURG 8.5 LF  PF SMOOTH BEAD CUF INTLK STRL BLU 11.8IN PROTEXIS NEU-THERA PLISPRN THK7.9 MIL (GLOVES AND ACCESSORIES) ×2 IMPLANT
GOWN SURG XL L4 IMPRV REINF SET IN SLEEVE STRL LF DISP BLU ASTND POLY (DRAPE/PACKS/SHEETS/OR TOWEL) ×2 IMPLANT
IMG CLEARIFY 8X6IN WARM HUB TROCAR WIPE MRFBR SYSTEM DISP (ENDOSCOPIC SUPPLIES) ×1 IMPLANT
IRR SUCT 32.48CM ENDOWRIST 1.2CM (MED SURG SUPPLIES) IMPLANT
IRR SUCT STRKFL2 STRL LF  DISP (MED SURG SUPPLIES) ×1 IMPLANT
NEEDLE HYPO 23GA 1.5IN TW ECLIPSE BVL ORNT PVT SHIELD LF (MED SURG SUPPLIES) ×2 IMPLANT
OBTURATOR LAPSCP 8MM WECK VST BLDLS STRL LF  DISP (ROBOTIC SUPPLIES) ×1 IMPLANT
PACK SURG LPCHL (CUSTOM TRAYS & PACK) ×1 IMPLANT
POUCH SPEC RETR 34.5CM 10MM E-CTCH GLD POLYUR ERG HNDL LONG CYL TUBE 29.5CM LAPSCP LF  DISP (ENDOSCOPIC SUPPLIES) ×1 IMPLANT
SEAL CANNULA 5-12 MM UNIVERSAL DISPOSABLE (ROBOTIC SUPPLIES) IMPLANT
SUTURE 0 CT2 VICRYL+ 27IN VIOL BRD ANBCTRL COAT ABS (SUTURE/WOUND CLOSURE) ×1 IMPLANT
SUTURE 4-0 PS2 MONOCRYL + 27IN UNDYED MONOF ANBCTRL ABS (SUTURE/WOUND CLOSURE) ×2 IMPLANT
SYRINGE LL 10ML LF  STRL GRAD N-PYRG DEHP-FR PVC FREE MED DISP (MED SURG SUPPLIES) ×1 IMPLANT
TIP CAUT HOT SHR DAVINCI ENDOWRIST 8MM STD CAUT MONOPOL DISP (ROBOTIC SUPPLIES) ×1 IMPLANT
TROCAR LAPSCP 100MM 12MM KII BAL BLUNT TIP STRL LF  OPTC ACCESS SYS ABDOMINAL (ENDOSCOPIC SUPPLIES) ×1 IMPLANT

## 2024-07-11 NOTE — OR Surgeon (Signed)
 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  Procedure(s)/Description:  ROBOTIC CHOLECYSTECTOMY LAPAROSCOPIC: S2900  52437 (CPT)       Attending Surgeon: Dempsey JAYSON Decant, MD     Anesthesia Staff:  Anesthesiologist: Stuart Mungo, MD  CRNA: Lenon Hun, CRNA    Anesthesia Type: General     Surgical Assistant: Kaitlin Scarberry, PA-C    Estimated Blood Loss: less than 50 ml     Specimens Removed:   Order Name Source Comment Collection Info Order Time   SURGICAL PATHOLOGY SPECIMEN Gallbladder Pre-op diagnosis:  Acute cholecystitis with chronic cholecystitis [K81.2] Collected By: Decant Dempsey JAYSON, MD 07/11/2024  8:48 AM     Release to patient   Manual release only          Reason for preventing immediate release   Privacy concern (Manual Release Only)             Complications:  None    Condition:  Stable    Disposition:   PACU - hemodynamically stable.    Indications:  [name] a 68 y.o. female who presents for diffuse upper abdominal pain with decreased appetite nausea and vomiting positive Murphy sign.  The patient is also known to have metastatic malignancy to the liver. [name]    Description of Procedure/Operation: Once informed consent was completed the patient was brought to the operative suite.  General endotracheal anesthesia was induced without complication.  Abdomen was prepped and draped for routine robotic cholecystectomy.  Time-out was then performed  the room was in agreement to proceed.  First 0.25% Marcaine  with epinephrine  was used for local anesthetic at the sites with the ports 1st port was the infra umbilical area once local was infused 15 blade scalpel was used to  make an infraumbilical incision proximally 2 cm in length.  Using S retractors and hemostat the urachus was dissected from the surrounding tissue and grasped with a Kocher clamp.  Following this dissection was down to the done down to the connection of the umbilicus to the midline fascia midline fascia vertically was incised 2 cm in length inferior to the insertion of the urachus.  Under direct visualization the peritoneum was pop through with a Kelly clamp.  The wound was then dilated with my finger following this placement of a 12 mm Hassan cannula with balloon was done.  The balloon was then insufflated.    Abdominal insufflation was then performed with CO2 up to 15 mmHg. Following this 3 other ports for the robot were used 8 mm in size.  Locations of the ports in the following locations 1st was in the left abdomen in the midclavicular line proximally 8 cm above the umbilicus a 2nd 8 mm port was done 8 cm lateral to the umbilicus on the right side in the midclavicular line.  This 4th port was placed 8 cm lateral to the last port in the anterior axillary line in the right lower quadrant.  Again this was all done under laparoscopic visualization once the ports were all placed brought is performed proceed with cholecystectomy.    Appearance of the gallbladder was was thickened but did not appear to be severely diseased there  was edema consistent with acute cholecystitis.  Though I believe most of her gallbladder problems associated due to her liver disease.  Once the dissection and freeing of the gallbladder was complete Hartmann's pouch was grasped the gallbladder was retracted proceed with cholecystectomy.  This was done 1st by dissecting the cystic duct and cystic artery around away from surrounding structures and a diet identifying the critical view.  During the process firefly was used to identify the cystic duct the other accessory ducts the identify any abnormal anatomy.  Once critical view was obtained the  cystic duct and the cystic artery both had 2 proximal clips applied and 1 distal and between the distal and proximal clamps were divided.  Following this the gallbladder was dissected away from the liver bed using electrocautery. Hemostasis was controlled with electrocautery once the gallbladder was completely freed from the liver bed it was placed into an endobag.  The gallbladder bed was inspected ensure there was good hemostasis.   Patient was then de docked from the robot the gallbladder was retrieved midline incision at the umbilicus was closed using a figure-of-eight 0 Vicryl suture.  All the ports skin sites were then closed using a running 4-0 Monocryl.  Dermabond was applied to the incisions.  Instrument lap counts reported correct x2. Estimated blood loss was 20 cc and no drains were placed.  Patient was aroused from anesthesia without difficulty and sent to recovery room in stable condition    Dempsey JAYSON Decant, MD

## 2024-07-11 NOTE — Assessment & Plan Note (Addendum)
 Patient seems to have symptomatic anemia, hemoglobin dropped from 11.8-9.3 and today 7.7   Likely secondary to acute illness or metastatic disease  Anemia panel not suggestive of iron  deficiency  Stool occult blood   Monitor CBC and transfuse for hemoglobin less than 7

## 2024-07-11 NOTE — Progress Notes (Addendum)
 [facility]  Progress Note    [name]  Date of service: 07/11/2024   Date of Admission:  07/08/2024    Hospital Day:  LOS: 3 days   Subjective: shortness of breath    Interval History:    [name] is a 68 y.o. female w/recently diagnosed Small-cell carcinoma w/liver mets, COPD, current smoker - not on O2 at home, CABG. She was found to have lung mass on last admission and underwent bronchoscopy w/bx of station 7 which revealed the small cell carcinoma. She came to hospital from outpatient infusion center to start chemotherapy but had dyspnea and hypoxia and was sent to ER for further evaluation. She was found to have COVID. She reports she's been feeling bad for the last few weeks. CTA chest - no PE, marginal increase in size of right infrahilar mass, right hilar and mediastinal lymphadenopathy compared to prior study. CT A/P: hepatomegaly, hepatic steatosis, and hepatic lesions not much change, new pericholecystic fluid now present. We are consulted for acute hypoxic respiratory failure, known patient.      [name] currently on 2 L NC/93%.  She is awake and alert.  Feels breathing is doing okay at present time.  Has some mild cough.  No chest pain.  No fevers noted.  No N/V/D.  No associated symptoms or modifying factors.     10/2: Patient on 3LNC/96%. Awake and alert. Feels breathing is improving. She is s/p lap chole. No chest pain. No fevers noted. No n/v/d.  No associated symptoms or modifying factors.     Vital Signs:  Temp  Avg: 36.7 C (98 F)  Min: 36.2 C (97.1 F)  Max: 37.1 C (98.8 F)    Pulse  Avg: 82.7  Min: 77  Max: 88 BP  Min: 132/69  Max: 174/95   Resp  Avg: 18  Min: 16  Max: 22 SpO2  Avg: 96.5 %  Min: 92 %  Max: 100 %          Input/Output    Intake/Output Summary (Last 24 hours) at 07/11/2024 1313  Last data filed at 07/11/2024 0905  Gross per 24 hour   Intake 956 ml   Output 30 ml   Net 926 ml    I/O last shift:  10/02 0700 - 10/02 1859  In: 900  [I.V.:900]  Out: 30 [Urine:30]   acetaminophen  (TYLENOL ) tablet, 650 mg, Oral, Q4H PRN  albuterol  (PROVENTIL ) 2.5 mg / 3 mL (0.083%) neb solution, 2.5 mg, Nebulization, 4x/day  aluminum-magnesium  hydroxide-simethicone (MAG-AL PLUS) 200-200-20 mg per 5 mL oral liquid, 30 mL, Oral, Q4H PRN  anastrozole  (ARIMIDEX ) tablet, 1 mg, Oral, Daily  budesonide (PULMICORT RESPULES) 0.5 mg/2 mL nebulizer suspension, 1 mg, Nebulization, 2x/day   And  arformoterol (BROVANA) 15 mcg/2 mL nebulizer solution, 15 mcg, Nebulization, 2x/day  aspirin  chewable tablet 81 mg, 81 mg, Oral, Daily  atorvastatin  (LIPITOR) tablet, 40 mg, Oral, Daily  benzonatate (TESSALON) capsule, 100 mg, Oral, Q8H PRN  cefTRIAXone (ROCEPHIN) 2 g in NS 50 mL IVPB with adaptor, 2 g, Intravenous, Q24H  cholecalciferol  (VITAMIN D3) 1000 unit (25 mcg) tablet, 1,000 Units, Oral, Daily  clonazePAM  (klonoPIN ) tablet, 0.5 mg, Oral, 2x/day PRN  NS 250 mL flush bag, , Intravenous, Q15 Min PRN   And  D5W 250 mL flush bag, , Intravenous, Q15 Min PRN  dexAMETHasone  (DECADRON ) tablet 6 mg, 6 mg, Oral, Daily  diphenhydrAMINE (BENADRYL) capsule, 25 mg, Oral, HS PRN  DULoxetine  (CYMBALTA ) delayed  release capsule, 60 mg, Oral, Daily  [Held by provider] enoxaparin PF (LOVENOX) 40 mg/0.4 mL SubQ injection, 40 mg, Subcutaneous, Q24H  fluticasone  (FLONASE ) 50 mcg per spray nasal spray, 1 Spray, Each Nostril, Daily PRN  folic acid  (FOLVITE ) tablet, 1 mg, Oral, Daily  gabapentin  (NEURONTIN ) capsule, 400 mg, Oral, 3x/day  HYDROcodone -acetaminophen  (NORCO) 5-325 mg per tablet, 1 Tablet, Oral, Q6H PRN  losartan  (COZAAR ) tablet, 25 mg, Oral, Daily  LR premix infusion, , Intravenous, Continuous  magnesium  hydroxide (MILK OF MAGNESIA) 400mg  per 5mL oral liquid, 15 mL, Oral, 4x/day PRN  metoprolol  tartrate (LOPRESSOR ) tablet, 25 mg, Oral, 2x/day  multivitamin-minerals-iron  oral liquid, 15 mL, Oral, Daily  NS flush syringe, 3 mL, Intracatheter, Q8HRS  NS flush syringe, 3 mL, Intracatheter, Q1H  PRN  OLANZapine  (zyPREXA ) tablet, 5 mg, Oral, NIGHTLY  omega-3 fatty acids (LOVAZA ) capsule, 1 g, Oral, Daily  ondansetron  (ZOFRAN ) 2 mg/mL injection, 4 mg, Intravenous, Q6H PRN  pantoprazole  (PROTONIX ) delayed release tablet, 40 mg, Oral, Daily  polyethylene glycol (MIRALAX) oral packet, 17 g, Oral, Daily  prochlorperazine  (COMPAZINE ) tablet, 10 mg, Oral, 3x/day PRN  traZODone  (DESYREL ) tablet, 150 mg, Oral, NIGHTLY        Physical Exam:    General: Alert and oriented, no acute distress  Eye: PERRL, EOMI, normal conjunctiva  HENT: Normocephalic, normal hearing, moist oral mucosa, sinus tenderness  Neck: Supple, non-tender, no JVD, no lymphadenopathy  Lungs: Clear/diminished, non-labored respiration  Heart: Normal rate, regular rhythm, no murmur, gallop, or edema  Abdomen: soft, non-tender, non-distended, normal bowel sounds, no masses  Musculoskeletal:  Normal range of motion and strength, no tenderness or swelling  Peripheral pulses:  2+ radial (L) and 2+ radial (R)  Skin: warm, dry, pink, and intact, no rashes or lesions  Neurological:  Awake, alert, and oriented x3, MAE, CNII-XII intact  Psychiatric:  Cooperative, appropriate mood and affect    Respiratory: 3LNC    Labs:     Results for orders placed or performed during the hospital encounter of 07/08/24 (from the past 24 hours)   STREP PNEUMONIAE AND LEGIONELLA ANTIGEN, URINE    Specimen: Urine, Site not specified   Result Value Ref Range    S.PNEUMONIAE ANTIGEN Negative Negative, Indeterminate    LEGIONELLA ANTIGEN Negative Negative, Indeterminate   PT/INR - ONCE   Result Value Ref Range    PROTHROMBIN TIME 15.5 (H) 12.1 - 15.3 seconds    INR 1.19 (H) 0.86 - 1.14   PTT (PARTIAL THROMBOPLASTIN TIME) - ONCE   Result Value Ref Range    APTT 30.9 22.4 - 34.8 seconds      Radiology:          Assessment & Plan:  Active Hospital Problems    Diagnosis    Primary Problem: Acute hypoxic respiratory failure (CMS HCC)    Thickening of wall of gallbladder with  pericholecystic fluid    Symptomatic anemia    COVID-19    Normocytic anemia    Transaminitis    Malignant neoplasm of lung, unspecified laterality, unspecified part of lung (CMS HCC)    Pulmonary HTN (CMS HCC)    Pneumonia due to COVID-19 virus    Mood disorder (CMS HCC)    Anemia, unspecified type    Cigarette smoker    Metastatic cancer to liver    History of breast cancer    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)    Paroxysmal atrial fibrillation (CMS HCC)    Hypertension  CAD (coronary artery disease)      Patient seen and examined. Reviewed pertinent labs, imaging, and notes.  68 y/o female recently dx SCLC w/liver mets, COPD not on O2 at home, current smoker, was out outpatient infusion center to start chemo and came in for dyspnea and hypoxia, found to have COVID  CTA chest - no PE, marginal increase in size of right infrahilar mass, right hilar and mediastinal lymphadenopathy compared to prior study. CT A/P: hepatomegaly, hepatic steatosis, and hepatic lesions not much change, new pericholecystic fluid now present.   Wean oxygen as patient tolerates for SpO2 >/=92%, on 3LNC  Continue steroids  Agree with empiric antibiotics-ceftriaxone  Urine antigens negative  U/S showed acute cholecystitis with pericholecystic fluid and wall thickening  She is now s/p lap chole on 10/2  Continue Brovana/Pulmicort   We will try to send for sputum culture   Continue nebs  This patient was seen and examined with my attending and he will add any further recommendations.      Merlin KANDICE Overland, APRN, CNP  _______________________________    I personally saw and evaluated the patient as part of a shared service with an APP.    My substantive findings are:  MDM (complete) I have personally managed 1 acute illness with systemic symptoms.  (respiratory failure from COVID complicated by acute cholecystitis requiring surgery today.)  I have reviewed and actively participated in management of patient's prescription  medications.  Continue Brovana and budesonide nebs.  Status post lap chole and doing well.  We will follow up.  Continue Rocephin.        Elsie Marsa Gully, MD  07/11/2024 14:58    This note may have been partially generated using MModal Fluency Direct system, and there may be some incorrect words, spellings, and punctuation that were not noted in checking the note before saving.

## 2024-07-11 NOTE — Anesthesia Procedure Notes (Signed)
 [facility]    Airway Note  General Information and Staff   Authorizing provider: Stuart Mungo, MD  Performing provider: Lenon Hun, CRNA        Reason: elective    Airway not difficult    Indications and Patient Condition  Pt location: In Or  Indications for airway management: anesthesia  Sedation level: minimal      Preoxygenated: yes Patient position: sniffing     Mask difficulty assessment: 1 - vent by mask        Final Airway Details    Final airway type: endotracheal airway        Successful airway: ETT and cuffed   Successful intubation technique: direct laryngoscopy              Endotracheal tube insertion site: oral  Blade: Miller  Blade size: #2  Airway size (mm): 7.0  Cormack-Lehane Classification: grade I - full view of glottis  Placement verified by: chest auscultation and capnometry   Marked at 20  Measured from: lips  Secured with: Trach tie  Number of attempts at approach: 1Airway complications: Atraumatic

## 2024-07-11 NOTE — Assessment & Plan Note (Signed)
 Continue trazodone

## 2024-07-11 NOTE — Assessment & Plan Note (Signed)
 Rate controlled, continue metoprolol , not on anticoagulation

## 2024-07-11 NOTE — Progress Notes (Signed)
 [facility] Progress Note    [name]       68 y.o.       Date of service: 07/11/2024  Date of Admission:  07/08/2024    Hospital Day:  LOS: 3 days   CC: Acute hypoxic respiratory failure (CMS HCC)    Subjective:  Patient was seen and examined at bedside this afternoon.  She underwent laparoscopic cholecystectomy this morning.      Objective:   Filed Vitals:    07/11/24 0932 07/11/24 0947 07/11/24 0950 07/11/24 1024   BP: (!) 154/89 (!) 157/66  137/76   Pulse: 77 83  88   Resp: (!) 22 20  18    Temp:    36.6 C (97.9 F)   SpO2: 100% 95% 99% 92%     Oxygen Device  SpO2: 92 %  Flow (L/min) (Oxygen Therapy): 2    acetaminophen  (TYLENOL ) tablet, 650 mg, Oral, Q4H PRN  albuterol  (PROVENTIL ) 2.5 mg / 3 mL (0.083%) neb solution, 2.5 mg, Nebulization, 4x/day  aluminum -magnesium  hydroxide-simethicone  (MAG-AL PLUS) 200-200-20 mg per 5 mL oral liquid, 30 mL, Oral, Q4H PRN  anastrozole  (ARIMIDEX ) tablet, 1 mg, Oral, Daily  budesonide  (PULMICORT  RESPULES) 0.5 mg/2 mL nebulizer suspension, 1 mg, Nebulization, 2x/day   And  arformoterol  (BROVANA ) 15 mcg/2 mL nebulizer solution, 15 mcg, Nebulization, 2x/day  aspirin  chewable tablet 81 mg, 81 mg, Oral, Daily  atorvastatin  (LIPITOR) tablet, 40 mg, Oral, Daily  benzonatate  (TESSALON ) capsule, 100 mg, Oral, Q8H PRN  cefTRIAXone  (ROCEPHIN ) 2 g in NS 50 mL IVPB with adaptor, 2 g, Intravenous, Q24H  cholecalciferol  (VITAMIN D3) 1000 unit (25 mcg) tablet, 1,000 Units, Oral, Daily  clonazePAM  (klonoPIN ) tablet, 0.5 mg, Oral, 2x/day PRN  NS 250 mL flush bag, , Intravenous, Q15 Min PRN   And  D5W 250 mL flush bag, , Intravenous, Q15 Min PRN  dexAMETHasone  (DECADRON ) tablet 6 mg, 6 mg, Oral, Daily  diphenhydrAMINE  (BENADRYL ) capsule, 25 mg, Oral, HS PRN  DULoxetine  (CYMBALTA ) delayed release capsule, 60 mg, Oral, Daily  [Held by provider] enoxaparin  PF (LOVENOX ) 40 mg/0.4 mL SubQ injection, 40 mg, Subcutaneous, Q24H  fluticasone  (FLONASE ) 50 mcg per spray  nasal spray, 1 Spray, Each Nostril, Daily PRN  folic acid  (FOLVITE ) tablet, 1 mg, Oral, Daily  gabapentin  (NEURONTIN ) capsule, 400 mg, Oral, 3x/day  HYDROcodone -acetaminophen  (NORCO) 5-325 mg per tablet, 1 Tablet, Oral, Q6H PRN  losartan  (COZAAR ) tablet, 25 mg, Oral, Daily  LR premix infusion, , Intravenous, Continuous  magnesium  hydroxide (MILK OF MAGNESIA) 400mg  per 5mL oral liquid, 15 mL, Oral, 4x/day PRN  metoprolol  tartrate (LOPRESSOR ) tablet, 25 mg, Oral, 2x/day  multivitamin-minerals-iron  oral liquid, 15 mL, Oral, Daily  NS flush syringe, 3 mL, Intracatheter, Q8HRS  NS flush syringe, 3 mL, Intracatheter, Q1H PRN  OLANZapine  (zyPREXA ) tablet, 5 mg, Oral, NIGHTLY  omega-3 fatty acids (LOVAZA ) capsule, 1 g, Oral, Daily  ondansetron  (ZOFRAN ) 2 mg/mL injection, 4 mg, Intravenous, Q6H PRN  pantoprazole  (PROTONIX ) delayed release tablet, 40 mg, Oral, Daily  polyethylene glycol (MIRALAX ) oral packet, 17 g, Oral, Daily  prochlorperazine  (COMPAZINE ) tablet, 10 mg, Oral, 3x/day PRN  traZODone  (DESYREL ) tablet, 150 mg, Oral, NIGHTLY        Physical Exam:  General: No acute distress  Cardiac: Regular rate and rhythm, no murmur auscultated.  Respiratory: Clear to auscultation bilaterally , no wheeze, rhonchi  Abdomen:soft, laparoscopic incisions clean, expected minimal tenderness present in right upper quadrant, no organomegaly  Neurological: Alert and Orient X 3;  No gross focal neurological deficits  Extremities: No peripheral edema noted     Results for orders placed or performed during the hospital encounter of 07/08/24 (from the past 24 hours)   RESPIRATORY CULTURE AND GRAM STAIN, AEROBIC    Collection Time: 07/10/24  1:10 PM    Specimen: Sputum; Other    Narrative    The following orders were created for panel order RESPIRATORY CULTURE AND GRAM STAIN, AEROBIC.  Procedure                               Abnormality         Status                     ---------                               -----------         ------                      SPUTUM [id number]]                                    Final result                 Please view results for these tests on the individual orders.   RESPIRATORY CULTURE AND GRAM STAIN (PERFORMABLE)    Collection Time: 07/10/24  1:10 PM    Specimen: Sputum   Result Value Ref Range    GRAM STAIN 1+ Rare Epithelial Cells     GRAM STAIN 1+ Rare WBCs     GRAM STAIN 2+ Few Yeast    STREP PNEUMONIAE AND LEGIONELLA ANTIGEN, URINE    Collection Time: 07/10/24  1:15 PM    Specimen: Urine, Site not specified   Result Value Ref Range    S.PNEUMONIAE ANTIGEN Negative Negative, Indeterminate    LEGIONELLA ANTIGEN Negative Negative, Indeterminate   PT/INR - ONCE    Collection Time: 07/10/24  1:59 PM   Result Value Ref Range    PROTHROMBIN  TIME 15.5 (H) 12.1 - 15.3 seconds    INR 1.19 (H) 0.86 - 1.14    Narrative    In the setting of warfarin therapy, a moderate-intensity INR goal range is 2.0 to 3.0 and a high-intensity INR goal range is 2.5 to 3.5.    INR is ONLY validated to determine the level of anticoagulation with vitamin K antagonists (warfarin). Other factors may elevate the INR including but not limited to direct oral anticoagulants (DOACs), liver dysfunction, vitamin K deficiency, DIC, factor deficiencies, and factor inhibitors.   PTT (PARTIAL THROMBOPLASTIN TIME) - ONCE    Collection Time: 07/10/24  1:59 PM   Result Value Ref Range    APTT 30.9 22.4 - 34.8 seconds         Micro:   Hospital Encounter on 07/08/24 (from the past 96 hours)   EXTENDED RESPIRATORY VIRUS PANEL    Collection Time: 07/08/24  2:42 PM    Specimen: Nasopharyngeal Swab   Culture Result Status    ADENOVIRUS ARRAY Not Detected Final    CORONAVIRUS 229E Not Detected Final    CORONAVIRUS HKU1 Not Detected Final    CORONAVIRUS NL63 Not Detected Final    CORONAVIRUS OC43 Not  Detected Final    SARS CORONAVIRUS 2 (SARS-CoV-2) Detected (A) Final    METAPNEUMOVIRUS ARRAY Not Detected Final    RHINOVIRUS/ENTEROVIRUS ARRAY Not Detected Final     INFLUENZA A Not Detected Final    INFLUENZA B ARRAY Not Detected Final    PARAINFLUENZA 1 ARRAY Not Detected Final    PARAINFLUENZA 2 ARRAY Not Detected Final    PARAINFLUENZA 3 ARRAY Not Detected Final    PARAINFLUENZA 4 ARRAY Not Detected Final    RSV ARRAY Not Detected Final    BORDETELLA PARAPERTUSSIS (IS 1001) Not Detected Final    BORDETELLA PERTUSSIS ARRAY Not Detected Final    CHLAMYDOPHILA PNEUMONIAE ARRAY Not Detected Final    MYCOPLASMA PNEUMONIAE ARRAY Not Detected Final   RESPIRATORY CULTURE AND GRAM STAIN, AEROBIC    Collection Time: 07/10/24  1:10 PM    Specimen: Sputum; Other    Narrative    The following orders were created for panel order RESPIRATORY CULTURE AND GRAM STAIN, AEROBIC.  Procedure                               Abnormality         Status                     ---------                               -----------         ------                     SPUTUM [id number]]                                    Final result                 Please view results for these tests on the individual orders.   RESPIRATORY CULTURE AND GRAM STAIN (PERFORMABLE)    Collection Time: 07/10/24  1:10 PM    Specimen: Sputum   Culture Result Status    GRAM STAIN 1+ Rare Epithelial Cells Preliminary    GRAM STAIN 1+ Rare WBCs Preliminary    GRAM STAIN 2+ Few Yeast Preliminary   STREP PNEUMONIAE AND LEGIONELLA ANTIGEN, URINE    Collection Time: 07/10/24  1:15 PM    Specimen: Urine, Site not specified   Culture Result Status    S.PNEUMONIAE ANTIGEN Negative Final    LEGIONELLA ANTIGEN Negative Final       Radiology:    Results for orders placed or performed during the hospital encounter of 07/08/24 (from the past 24 hours)   MRI MRCP WO CONTRAST     Status: None    Narrative    Russie JEAN Auletta    MRI MRCP WO CONTRAST performed on 07/10/2024 12:54 PM.    INDICATION:  acute chole on imaging, cbd 7 mm, elevated lfts, r/o stone   Additional History:  Acute chole on imaging, cbd 7 mm, elevated lfts, rule out stone.  Stomach pain.    Multiplanar, multisequence MRI performed without contrast.  3-D MIP images obtained.    * Diffuse nodular foci of increased T2 signal throughout the enlarged liver.  * Portal and hepatic veins appear to be patent.  * Small amount of perihepatic  fluid.  * No gallbladder wall thickening or stones. Pericholecystic fluid is present.  * Mild extrahepatic bile duct dilatation, measuring 9 mm coronal image 44 series 7.  * No pancreatic mass or pancreatic duct dilatation.  * Spleen is normal in size.  * Adrenal glands unremarkable.  * Several small T2 hyperintense renal lesions, most compatible with cysts.    * Heterogeneous T2 signal within the marrow, clinical assessment for anemia, there is scoliosis with degenerative disease.      Impression    * Mild extra hepatic bile duct dilatation as noted on CT 07/08/2024, no bile duct stone.    * No gallstones, there is pericholecystic fluid as well as fluid adjacent to the liver.    * Diffuse heterogeneous appearance to the liver as described above, this could certainly represent findings associated with acute hepatitis, differential considerations include regenerative nodularity in chronic liver disease, metastatic disease/lymphoma, and possibly sarcoid          Radiologist location ID: [id number]        Radiologist location ID: [id number]         Assessment/ Plan:   Assessment & Plan  Acute hypoxic respiratory failure (CMS HCC)  Likely due to COVID and possible superimposed bacterial pneumonia  Currently on 2 L nasal cannula  Respiratory panel positive for COVID  CT angiogram did not show any evidence pulmonary embolism although marginal increase in the size of infrahilar mass, right hilar and mediastinal lymphadenopathy.  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)  Malignant neoplasm of lung, unspecified laterality, unspecified part of lung (CMS HCC)  Known history of small-cell carcinoma, planned for outpatient chemotherapy with carboplatin/etoposide/tecenteriq  Oncology consulted, recommended to withhold current chemotherapy regimen until acute illness resolves     Normocytic anemia  Patient seems to have symptomatic anemia, hemoglobin dropped from 11.8-9.3 and today 7.7   Likely secondary to acute illness or metastatic disease  Anemia panel not suggestive of iron  deficiency  Stool occult blood   Monitor CBC and transfuse for hemoglobin less than 7  CAD (coronary artery disease)  History of CABG x1   Continue aspirin , atorvastatin , metoprolol     Hypertension  Pressure control  Continue losartan , metoprolol   Paroxysmal atrial fibrillation (CMS HCC)  Rate controlled, continue metoprolol , not on anticoagulation  History of breast cancer  S/p mastectomy  Metastatic cancer to liver  Worsening metastatic liver disease likely causing elevated transaminases  Transaminitis  Likely secondary to liver metastasis  Pulmonary HTN (CMS HCC)  Preliminary is following  Mood disorder (CMS HCC)  Continue trazodone   Pneumonia due to COVID-19 virus  Positive on viral panel with new oxygen requirement  Treat with DuoNebs and Decadron   Empirically treating with antibiotics for probable community-acquired pneumonia  Cigarette smoker       Deep vein thrombosis prophylaxis-SCDs due to anemia      Candyce Salles, MD

## 2024-07-11 NOTE — Assessment & Plan Note (Addendum)
 Pressure control  Continue losartan , metoprolol 

## 2024-07-11 NOTE — Assessment & Plan Note (Signed)
 Worsening metastatic liver disease likely causing elevated transaminases

## 2024-07-11 NOTE — Assessment & Plan Note (Addendum)
 Likely due to COVID and possible superimposed bacterial pneumonia  Currently on 2 L nasal cannula  Respiratory panel positive for COVID  CT angiogram did not show any evidence pulmonary embolism although marginal increase in the size of infrahilar mass, right hilar and mediastinal lymphadenopathy.  Continue IV Decadron    Continue empiric IV Rocephin  Continue Brovana and Pulmicort   Pulmonary is following, appreciate input

## 2024-07-11 NOTE — Care Plan (Signed)
 [facility] Plan Note      Problem: Wound  Goal: Optimal Coping  Outcome: Ongoing (see interventions/notes)  Goal: Optimal Functional Ability  Outcome: Ongoing (see interventions/notes)  Goal: Absence of Infection Signs and Symptoms  Outcome: Ongoing (see interventions/notes)  Intervention: Prevent or Manage Infection  Recent Flowsheet Documentation  Taken 07/11/2024 1031 by Clayborne PARAS, RN  Isolation Precautions:   droplet precautions initiated   droplet precautions maintained  Goal: Improved Oral Intake  Outcome: Ongoing (see interventions/notes)  Goal: Optimal Pain Control and Function  Outcome: Ongoing (see interventions/notes)  Goal: Skin Health and Integrity  Outcome: Ongoing (see interventions/notes)  Intervention: Optimize Skin Protection  Recent Flowsheet Documentation  Taken 07/11/2024 1031 by Clayborne PARAS, RN  Pressure Reduction Techniques:   30 degree lateral position utilized   Heels elevated off of the bed  Pressure Reduction Devices: Specialty bed/mattress utilized  Goal: Optimal Wound Healing  Outcome: Ongoing (see interventions/notes)  Intervention: Promote Wound Healing  Recent Flowsheet Documentation  Taken 07/11/2024 1031 by Clayborne PARAS, RN  Pressure Reduction Techniques:   30 degree lateral position utilized   Heels elevated off of the bed  Pressure Reduction Devices: Specialty bed/mattress utilized     Problem: Adult Inpatient Plan of Care  Goal: Plan of Care Review  Outcome: Ongoing (see interventions/notes)  Goal: Patient-Specific Goal (Individualized)  Outcome: Ongoing (see interventions/notes)  Flowsheets (Taken 07/11/2024 1031)  Individualized Care Needs:   pain management   void post foley removal  Anxieties, Fears or Concerns: None voiced  Goal: Absence of Hospital-Acquired Illness or Injury  Outcome: Ongoing (see interventions/notes)  Intervention: Identify and Manage Fall Risk  Recent Flowsheet Documentation  Taken 07/11/2024 1031 by Clayborne PARAS, RN  Safety Promotion/Fall  Prevention:   activity supervised   fall prevention program maintained   nonskid shoes/slippers when out of bed   safety round/check completed  Intervention: Prevent Skin Injury  Recent Flowsheet Documentation  Taken 07/11/2024 1031 by Clayborne PARAS, RN  Skin Protection:   adhesive use limited   incontinence pads utilized  Intervention: Prevent and Manage VTE (Venous Thromboembolism) Risk  Recent Flowsheet Documentation  Taken 07/11/2024 1343 by Clayborne PARAS, RN  Sequential Compression Device (SCDs): Sequential compression device on  Taken 07/11/2024 1031 by Clayborne PARAS, RN  VTE Prevention/Management: (Teds/SCDs on and working.) other (see comments)  Intervention: Prevent Infection  Recent Flowsheet Documentation  Taken 07/11/2024 1031 by Clayborne PARAS, RN  Infection Prevention:   promote handwashing   equipment surfaces disinfected  Goal: Optimal Comfort and Wellbeing  Outcome: Ongoing (see interventions/notes)  Goal: Rounds/Family Conference  Outcome: Ongoing (see interventions/notes)       Clayborne Mace, RN

## 2024-07-11 NOTE — Anesthesia Transfer of Care (Signed)
 ANESTHESIA TRANSFER OF CARE   [name] is a 68 y.o. ,female, Weight: 62.1 kg (137 lb)   had Procedure(s):  ROBOTIC CHOLECYSTECTOMY LAPAROSCOPIC  performed  07/11/24   Primary Service: Estell JONELLE Dials*    Past Medical History:   Diagnosis Date    Abdominal pain     Cancer (CMS [facility])     Chest pain     Chronic lung disease     Coronary artery disease     Dyslipidemia     Essential hypertension     Heart disease     History of percutaneous coronary intervention     History of stress test     Mixed hyperlipidemia     Unspecified chronic bronchitis       Allergy History as of 07/11/24        No Known Allergies                  I completed my transfer of care / handoff to the receiving personnel during which we discussed:  Access, Airway, All key/critical aspects of case discussed, Analgesia, Antibiotics, Expectation of post procedure, Fluids/Product, Gave opportunity for questions and acknowledgement of understanding, Labs and PMHx      Post Location: PACU                                                           Last OR Temp: Temperature: 36.2 C (97.1 F)      Airway:* No LDAs found *  Blood pressure (!) 174/95, pulse 85, temperature 36.2 C (97.1 F), resp. rate 18, height 1.549 m (5' 1), weight 62.1 kg (137 lb), SpO2 100%.

## 2024-07-11 NOTE — Assessment & Plan Note (Addendum)
 Preliminary is following

## 2024-07-11 NOTE — Care Plan (Signed)
 Problem: Wound  Goal: Optimal Coping  Outcome: Ongoing (see interventions/notes)  Intervention: Support Patient and Family Response  Recent Flowsheet Documentation  Taken 07/10/2024 2214 by Francis HERO, GN  Supportive Measures: active listening utilized  Goal: Optimal Functional Ability  Outcome: Ongoing (see interventions/notes)  Goal: Absence of Infection Signs and Symptoms  Outcome: Ongoing (see interventions/notes)  Intervention: Prevent or Manage Infection  Recent Flowsheet Documentation  Taken 07/10/2024 2214 by Francis HERO, GN  Fever Reduction/Comfort Measures:   lightweight bedding   lightweight clothing  Goal: Improved Oral Intake  Outcome: Ongoing (see interventions/notes)  Goal: Optimal Pain Control and Function  Outcome: Ongoing (see interventions/notes)  Goal: Skin Health and Integrity  Outcome: Ongoing (see interventions/notes)  Intervention: Optimize Skin Protection  Recent Flowsheet Documentation  Taken 07/10/2024 2214 by Francis HERO, GN  Pressure Reduction Techniques: Mobility is maximized  Head of Bed (HOB) Positioning: HOB at 30-45 degrees  Goal: Optimal Wound Healing  Outcome: Ongoing (see interventions/notes)  Intervention: Promote Wound Healing  Recent Flowsheet Documentation  Taken 07/10/2024 2214 by Francis HERO, GN  Pressure Reduction Techniques: Mobility is maximized     Problem: Adult Inpatient Plan of Care  Goal: Plan of Care Review  Outcome: Ongoing (see interventions/notes)  Goal: Patient-Specific Goal (Individualized)  Outcome: Ongoing (see interventions/notes)  Flowsheets (Taken 07/10/2024 2214)  Individualized Care Needs: keep informed  Anxieties, Fears or Concerns: none voiced  Goal: Absence of Hospital-Acquired Illness or Injury  Outcome: Ongoing (see interventions/notes)  Intervention: Identify and Manage Fall Risk  Recent Flowsheet Documentation  Taken 07/11/2024 0200 by Francis HERO, GN  Safety Promotion/Fall Prevention:   fall prevention program maintained   nonskid shoes/slippers when out of  bed   safety round/check completed  Taken 07/10/2024 2359 by Francis HERO, GN  Safety Promotion/Fall Prevention:   fall prevention program maintained   nonskid shoes/slippers when out of bed   safety round/check completed  Taken 07/10/2024 2214 by Francis HERO, GN  Safety Promotion/Fall Prevention:   fall prevention program maintained   nonskid shoes/slippers when out of bed   safety round/check completed  Taken 07/10/2024 2000 by Francis HERO, GN  Safety Promotion/Fall Prevention:   fall prevention program maintained   nonskid shoes/slippers when out of bed   safety round/check completed  Intervention: Prevent Skin Injury  Recent Flowsheet Documentation  Taken 07/10/2024 2214 by Francis HERO, GN  Body Position: supine, head elevated  Skin Protection: adhesive use limited  Intervention: Prevent Infection  Recent Flowsheet Documentation  Taken 07/10/2024 2214 by Francis HERO, GN  Infection Prevention: promote handwashing  Goal: Optimal Comfort and Wellbeing  Outcome: Ongoing (see interventions/notes)  Intervention: Provide Person-Centered Care  Recent Flowsheet Documentation  Taken 07/10/2024 2214 by Francis HERO, GN  Trust Relationship/Rapport:   care explained   choices provided   questions answered   questions encouraged   reassurance provided  Goal: Rounds/Family Conference  Outcome: Ongoing (see interventions/notes)   Care delivered per above care plan.

## 2024-07-11 NOTE — Nurses Notes (Signed)
 Patient called out stating her incision is bleeding, went to room and noted bright red blood gown, on inspection of abdomen, LLQ incision has some bleeding noted. Patient states she doesn't know if the blue clip of the ice pack hit her incision or if she coughed. Used sterile guaze to cover site and placed ice pack over incision site. Did notice more bruising around area. Notified Dr. Lucente and Dr. Carline of findings. Patient has no s/s of distress and VSS at this time.

## 2024-07-11 NOTE — Assessment & Plan Note (Signed)
S/p mastectomy 

## 2024-07-11 NOTE — Assessment & Plan Note (Addendum)
 S/p laparoscopic cholecystectomy this morning   Appreciate General surgery input

## 2024-07-11 NOTE — Nurses Notes (Signed)
 Received orders from Dr. Lucente to use vaseline gauze and (5) 4x4 gauzes and secure with tape to make pressure dressing to bleeding incisional site to LLQ. Also was told to hold all anticoagulants. Dressed bleeding incisional site per providers orders. Patient tolerated well. Will continue to monitor.

## 2024-07-11 NOTE — Consults (Signed)
 [facility] Medicine [facility]  Hematology/Oncology    Consult progress Note      [name], [name], 68 y.o. female  Date of Birth:  11/29/1955  Inpatient Admission Date:  07/08/2024  Date of Service: 07/11/2024    Chief Complaint:  Known patient - history of small-cell lung CA with liver metastases    Interim HPI:  Not in room this morning.  No acute events reported overnight.  General surgery following for cholelithiasis.  S/p cholecystectomy today, 07/11/2024.    HPI:  This is a 68 year old female who presented outpatient infusion center today for Tecentriq/etoposide/carbo treatment when she reported worsening dyspnea to nursing staff.  She was found to be 89% on room air.  Was then placed on 2 L nasal cannula O2 which increased her to 94% oxygenation.  Also c/o worsening generalized pain that is worse in the legs as well as productive cough with yellow sputum.  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    Review of Systems:   Review of Systems   Constitutional:  Positive for fatigue. Negative for activity change, appetite change, chills, diaphoresis, fever and unexpected weight change.   HENT:  Negative for congestion, ear pain, facial swelling, hearing loss, mouth sores, nosebleeds, sinus pain, sore throat, tinnitus, trouble swallowing and voice change.    Eyes:  Negative for photophobia, pain and visual disturbance.   Respiratory:  Positive for cough and shortness of breath. Negative for apnea, choking, chest tightness, wheezing and stridor.    Cardiovascular:  Negative for chest pain, palpitations and leg swelling.   Gastrointestinal:  Negative for abdominal distention, abdominal pain, anal bleeding, blood in stool, constipation, diarrhea, nausea, rectal pain and vomiting.   Endocrine: Negative for cold intolerance and heat intolerance.   Genitourinary:  Negative for decreased urine volume, difficulty urinating, dysuria, flank pain,  frequency, hematuria and urgency.   Musculoskeletal:  Positive for arthralgias and myalgias. Negative for back pain, gait problem, joint swelling and neck pain.   Skin:  Negative for color change, pallor, rash and wound.   Allergic/Immunologic: Negative for immunocompromised state.   Neurological:  Negative for dizziness, syncope, facial asymmetry, speech difficulty, weakness, light-headedness, numbness and headaches.   Hematological:  Negative for adenopathy. Does not bruise/bleed easily.   Psychiatric/Behavioral:  Negative for agitation, behavioral problems and confusion.      Past Medical History:   Diagnosis Date    Abdominal pain     Cancer (CMS HCC)     Chest pain     Chronic lung disease     Coronary artery disease     Dyslipidemia     Essential hypertension     Heart disease     History of percutaneous coronary intervention     History of stress test     Mixed hyperlipidemia     Unspecified chronic bronchitis      Past Surgical History:   Procedure Laterality Date    CARDIAC CATHETERIZATION      CORONARY ANGIOPLASTY      CORONARY ARTERY STENT PLACEMENT      HX BACK SURGERY      HX CORONARY ARTERY BYPASS GRAFT      HX MASTECTOMY, SIMPLE Bilateral     HX TUBAL LIGATION      NECK SURGERY       Social History     Socioeconomic History    Marital status: Widowed     Spouse  name: Not on file    Number of children: Not on file    Years of education: Not on file    Highest education level: Not on file   Occupational History    Not on file   Tobacco Use    Smoking status: Every Day     Current packs/day: 0.50     Average packs/day: 0.5 packs/day for 52.8 years (26.4 ttl pk-yrs)     Types: Cigarettes     Start date: 53    Smokeless tobacco: Never   Vaping Use    Vaping status: Never Used   Substance and Sexual Activity    Alcohol use: Yes     Comment: occasional    Drug use: Yes     Types: Marijuana    Sexual activity: Not on file   Other Topics Concern    Not on file   Social History Narrative    Not on file      Social Determinants of Health     Financial Resource Strain: Not on file   Transportation Needs: Not on file   Social Connections: Low Risk (07/09/2024)    Social Connections     SDOH Social Isolation: 5 or more times a week   Intimate Partner Violence: Not on file   Housing Stability: Not on file      Family Medical History:       Problem Relation (Age of Onset)    Arthritis Mother, Maternal Grandmother    Cancer Father, Other    Heart Attack Father, Other    Heart Disease Brother, Other    Hypertension (High Blood Pressure) Mother, Father, Other    Pacemaker Mother          Vital Signs:  Temp  Avg: 36.7 C (98 F)  Min: 36.2 C (97.1 F)  Max: 37.1 C (98.8 F)    Pulse  Avg: 82.7  Min: 77  Max: 88 BP  Min: 132/69  Max: 174/95   Resp  Avg: 18  Min: 16  Max: 22 SpO2  Avg: 96.5 %  Min: 92 %  Max: 100 %          Input/Output    Intake/Output Summary (Last 24 hours) at 07/11/2024 1141  Last data filed at 07/11/2024 0905  Gross per 24 hour   Intake 956 ml   Output 30 ml   Net 926 ml    I/O last shift:  10/02 0700 - 10/02 1859  In: 900 [I.V.:900]  Out: 30 [Urine:30]     LABS:   Results for orders placed or performed during the hospital encounter of 07/08/24 (from the past 24 hours)   RESPIRATORY CULTURE AND GRAM STAIN (PERFORMABLE)    Specimen: Sputum   Result Value Ref Range    GRAM STAIN 1+ Rare Epithelial Cells     GRAM STAIN 1+ Rare WBCs     GRAM STAIN 2+ Few Yeast    STREP PNEUMONIAE AND LEGIONELLA ANTIGEN, URINE    Specimen: Urine, Site not specified   Result Value Ref Range    S.PNEUMONIAE ANTIGEN Negative Negative, Indeterminate    LEGIONELLA ANTIGEN Negative Negative, Indeterminate   PT/INR - ONCE   Result Value Ref Range    PROTHROMBIN  TIME 15.5 (H) 12.1 - 15.3 seconds    INR 1.19 (H) 0.86 - 1.14   PTT (PARTIAL THROMBOPLASTIN TIME) - ONCE   Result Value Ref Range    APTT 30.9 22.4 - 34.8 seconds  IMAGES:  Results for orders placed or performed during the hospital encounter of 07/08/24 (from the past 72  hours)   CT ABDOMEN PELVIS W IV CONTRAST     Status: None    Narrative    Taji JEAN Slaven    CT ABDOMEN PELVIS W IV CONTRAST performed on 07/08/2024 1:18 PM.    INDICATION:  abd pain   Additional History:  abdomen pain, nausea, vomiting    TECHNIQUE:  CT abdomen and pelvis with axial, coronal, and sagittal multiplanar reformations, performed with contrast. Dose modulation, automated exposure control, and/or iterative reconstruction were used for dose reduction.    CONTRAST:  100 mL Isovue  370 IV    COMPARISON:  Abdomen CT 06/14/2024  ___________________________________  FINDINGS:    LOWER THORAX: Unremarkable.    HEPATOBILIARY:  Hepatomegaly and hepatic steatosis. Scattered hypodense lesions in the liver redemonstrated not greatly changed and the short-term interval.  Pericholecystic fluid now present, gallbladder slightly contracted.  No intrahepatic or extrahepatic ductal dilation.  PANCREAS: No focal masses or ductal dilatation.  SPLEEN: No splenomegaly.    ADRENALS: No adrenal nodules.  KIDNEYS/URETERS/BLADDER: No hydronephrosis,. Bilateral renal cyst and multiple bilateral renal calculi redemonstrated.  REPRODUCTIVE: No significant findings.    PERITONEUM / RETROPERITONEUM: New free fluid in the dependent pelvis.  LYMPH NODES: No lymphadenopathy. Small retroperitoneal are unchanged. Lymph nodes  VESSELS: Abdominal aorta is normal in caliber. Severe atherosclerotic disease aorta.    GI TRACT: No distention, wall thickening, or inflammatory stranding.  Appendix is normal. Colon diverticulosis. ABDOMINAL WALL:  Intact.    SOFT TISSUES: Unremarkable.  BONES: Degenerative changes are present in the spine  ___________________________________    Impression    1. Hepatomegaly, hepatic steatosis and hepatic lesions, not greatly changed and new pericholecystic fluid now present.  2. Multiple nonobstructing bilateral renal calculi.  3. New free fluid in the pelvis.      Radiologist location ID: [id number]     CT ANGIO  CHEST FOR PULMONARY EMBOLUS W IV CONTRAST     Status: None    Narrative    Aylyn JEAN Peixoto    CT ANGIO CHEST FOR PULMONARY EMBOLUS W IV CONTRAST performed on 07/08/2024 4:34 PM.    INDICATION:  PE   Additional History:  Dyspnea, chest pain, history of small cell lung cancer, coronary stent, and CABG.    TECHNIQUE:  CT angiography of the chest performed for evaluation of the pulmonary arteries, with axial, sagittal, and coronal multiplanar reformations and a coronal 3D MIP.  Dose modulation, automated exposure control, and/or iterative reconstruction were used for dose reduction.    CONTRAST: 75 mL of Isovue  370 IV    COMPARISON:  June 14, 2024  ___________________________________  FINDINGS:    HEART / GREAT VESSELS: There are no filling defects in the pulmonary arteries to suggest a pulmonary embolism.  The heart is normal in size without pericardial effusion.  The thoracic aorta is unremarkable.     LUNGS / AIRWAYS: Focal area of scarring is noted in the right upper lobe. No significant interval change compared to the prior study. Right infrahilar masslike lesion is again noted, demonstrates marginal increase in size compared to the prior study now measuring approximately 3.8 x 3.3 cm. Interlobular emphysematous changes are noted bilaterally. No lobar consolidation, pleural effusions or pneumothorax.  There is narrowing of the right mainstem and right segmental and proximal subsegmental bronchioles.. There are no suspicious pulmonary nodules.    MEDIASTINUM: There is right supraclavicular lymphadenopathy, measuring 2.2  cm, marginally increased in size compared to the prior study. Few scattered right supraclavicular lymph nodes are unchanged. Marginal increase in the size of the right paratracheal conglomerate lymph nodal mass now measuring 2.3 cm (previously 1.9 cm. Marginal increase in the right hilar and infrahilar lymphadenopathy. The subcarinal lymph node measures 3 cm.    UPPER ABDOMEN: Few scattered left  para-aortic lymph nodes are noted, not significant by size criteria. These are partially visualized on the current study. Previously noted liver lesions are not well identified on the current study due to the phase of IV contrast.    BONES:  ACDF of the lower cervical spine. Median sternotomy and mediastinal clips are noted.    OTHER:  The visualized thyroid  gland is unremarkable.  ___________________________________    Impression    1. No evidence of pulmonary embolism.  2. Marginal increase in the size of the right infrahilar mass, right hilar and mediastinal lymphadenopathy compared to the prior study.  3. Other chronic findings as described above.        Radiologist location ID: [id number]     US  RT UPPER QUADRANT     Status: None    Narrative    Truly JEAN Colvin    US  RT UPPER QUADRANT performed on 07/09/2024 2:47 PM.    INDICATION:  liver mets, worsening elevated lfts, pericholecystic fluid   Additional History:  liver mets, worsening elevated lfts, pericholecystic fluid    TECHNIQUE:  Transabdominal grayscale sonogram of the right upper quadrant, with color Doppler imaging.      COMPARISON:  06/14/2024  ___________________________________  FINDINGS:    The visualized pancreas is unremarkable; the tail is obscured.     The liver measures 21.4 along its right lateral margin and demonstrates diffuse increased hepatic echotexture.  No focal lesions are seen.  The portal vein is patent, with appropriate direction of flow on color Doppler and waveform imaging.    The common bile duct measures up to 7.6 mm in caliber, and there is no intrahepatic ductal dilation.    Gallbladder wall thickening with pericholecystic fluid.       GB wall thickness: 3.6    The right kidney measures 12.7 x 5.4 x 4.5, without hydronephrosis.    Trace ascites.  ___________________________________    Impression    Gallbladder wall thickening with pericholecystic fluid consistent with acute cholecystitis.    Dilated common bile duct without  distal obstructing lesion identified.    Hepatomegaly with hepatic steatosis.    Trace ascites.          Radiologist location ID: [id number]     MRI MRCP WO CONTRAST     Status: None    Narrative    Ava JEAN Soliday    MRI MRCP WO CONTRAST performed on 07/10/2024 12:54 PM.    INDICATION:  acute chole on imaging, cbd 7 mm, elevated lfts, r/o stone   Additional History:  Acute chole on imaging, cbd 7 mm, elevated lfts, rule out stone. Stomach pain.    Multiplanar, multisequence MRI performed without contrast.  3-D MIP images obtained.    * Diffuse nodular foci of increased T2 signal throughout the enlarged liver.  * Portal and hepatic veins appear to be patent.  * Small amount of perihepatic fluid.  * No gallbladder wall thickening or stones. Pericholecystic fluid is present.  * Mild extrahepatic bile duct dilatation, measuring 9 mm coronal image 44 series 7.  * No pancreatic mass or pancreatic duct dilatation.  * Spleen  is normal in size.  * Adrenal glands unremarkable.  * Several small T2 hyperintense renal lesions, most compatible with cysts.    * Heterogeneous T2 signal within the marrow, clinical assessment for anemia, there is scoliosis with degenerative disease.      Impression    * Mild extra hepatic bile duct dilatation as noted on CT 07/08/2024, no bile duct stone.    * No gallstones, there is pericholecystic fluid as well as fluid adjacent to the liver.    * Diffuse heterogeneous appearance to the liver as described above, this could certainly represent findings associated with acute hepatitis, differential considerations include regenerative nodularity in chronic liver disease, metastatic disease/lymphoma, and possibly sarcoid          Radiologist location ID: [id number]        Radiologist location ID: [id number]       Physical Exam:  Physical Exam  Vitals reviewed.   Constitutional:       Appearance: She is normal weight. She is ill-appearing.   HENT:      Head: Normocephalic.      Nose: Nose normal.       Mouth/Throat:      Pharynx: Oropharynx is clear.   Eyes:      Extraocular Movements: Extraocular movements intact.      Conjunctiva/sclera: Conjunctivae normal.   Cardiovascular:      Rate and Rhythm: Normal rate.      Pulses: Normal pulses.   Pulmonary:      Effort: Pulmonary effort is normal.   Abdominal:      Palpations: Abdomen is soft.   Musculoskeletal:         General: Normal range of motion.      Cervical back: Normal range of motion.   Skin:     General: Skin is dry.   Neurological:      General: No focal deficit present.      Mental Status: She is alert and oriented to person, place, and time. Mental status is at baseline.   Psychiatric:         Mood and Affect: Mood normal.         Behavior: Behavior normal.         Thought Content: Thought content normal.         Judgment: Judgment normal.       Problem List:  Active Hospital Problems   (*Primary Problem)    Diagnosis    *Acute hypoxic respiratory failure (CMS HCC)    Thickening of wall of gallbladder with pericholecystic fluid    Symptomatic anemia    COVID-19    Normocytic anemia    Transaminitis    Malignant neoplasm of lung, unspecified laterality, unspecified part of lung (CMS HCC)    Pulmonary HTN (CMS HCC)    Pneumonia due to COVID-19 virus    Mood disorder (CMS HCC)    Anemia, unspecified type    Cigarette smoker    Metastatic cancer to liver    History of breast cancer     Chronic    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)    Paroxysmal atrial fibrillation (CMS HCC)     Chronic    Hypertension     Chronic    CAD (coronary artery disease)      Assessment/Plan:    68 year old female presenting with acute hypoxic respiratory failure:    Acute hypoxic respiratory failure:  CXR revealing vague pulmonary opacities that could  be mild PNA or atelectasis.  She is COVID positive.  CTA chest to rule out PE to be completed.  She is on IV antibiotics.    Small-cell lung CA with liver metastasis:  Follows with Medical Oncology on  outpatient basis at [facility].  She was to receive carboplatin/etoposide/Tecentriq on 07/08/2024 when she presented with acute respiratory symptoms that prompted further evaluation by the emergency room as stated in the above HPI.  Withhold current chemotherapy regimen until resolution of acute conditions.    Normocytic hypochromic anemia:   Current H/H = 7.6/24.8.  No acute blood loss noted.  Could be secondary to acute illness or metastatic disease.  Vitamin B12 normal at 1106.  Folate low at 3.5.  Started on daily folic acid  if not already taking.  Copper  pending.  Noted transaminitis as well as elevated alk-phos.  This is likely related to liver mets.  Continue to monitor counts.  Transfuse with PRBC if hemoglobin less than 7 grams/deciliter or greater than 7 and symptomatic.  Will benefit from blood transfusion.    Thrombocytopenia:  Current counts = 49,000.  No overt bruising or bleeding.  Likely secondary to underlying metastatic disease.  Defer further thrombocytopenia panel at this time.  Continue to monitor counts.  Transfuse with platelets if <50 K and actively bleeding or <10 K w/wo bleeding.    Cholelithiasis: General surgery is following.  She is s/p cholecystectomy today, 07/11/2024.    Hypertension: Managed with antihypertensives.    HLD:  On statin.    CAD:  History of CABG x1.  On cardioprotective medication, 81 mg ASA daily.      Attending note:  I have independently seen and examined the patient on the same date of service as our mid-level.  I agree with the plan of care as documented by my mid-level.  I have personally reviewed  the pertinent medications, labs  and radiographic studies  as documented in my mid-level note.  The case was discussed in detail with the midlevel provider.  I agree with the above note.  Some changes and additions were made.  I spent more than 50% of the total encounter time for the care of this patient and documentation.      Norleen Amen, MD

## 2024-07-11 NOTE — Assessment & Plan Note (Signed)
 Likely secondary to liver metastasis

## 2024-07-11 NOTE — Assessment & Plan Note (Signed)
 Positive on viral panel with new oxygen requirement  Treat with DuoNebs and Decadron   Empirically treating with antibiotics for probable community-acquired pneumonia

## 2024-07-11 NOTE — Anesthesia Preprocedure Evaluation (Signed)
 ANESTHESIA PRE-OP EVALUATION  Planned Procedure: ROBOTIC CHOLECYSTECTOMY LAPAROSCOPIC (Right: Abdomen)  Review of Systems         patient summary reviewed  nursing notes reviewed        Pulmonary   pneumonia, COPD, moderate, shortness of breath and current smoker,   Cardiovascular    Hypertension, poorly controlled, CAD, cardiac stents, atrial fibrillation and hyperlipidemia ,No peripheral edema,  Exercise Tolerance: <4 METS        GI/Hepatic/Renal    liver disease        Endo/Other    anemia,      Neuro/Psych/MS        Cancer  CA,                       Physical Assessment      Airway       Mallampati: II    TM distance: >3 FB    Mouth Opening: good.  No Facial hair  No Beard        Dental           (+) edentulous           Pulmonary           Cardiovascular    Rhythm: regular  Rate: Normal  (-) no friction rub, carotid bruit is not present, no peripheral edema and no murmur     Other findings              Plan  ASA 4     Planned anesthesia type: general     general anesthesia with endotracheal tube intubation      PONV Plan:  I plan to administer pharmcologic prophalaxis antiemetics              Intravenous induction     Anesthesia issues/risks discussed are: PONV, Blood Loss, Sore Throat, Post-op Cognitive Dysfunction, Post-op Pain Management, Post-op Agitation/Tantrum and Cardiac Events/MI.  Anesthetic plan and risks discussed with patient and other  signed consent obtained      Use of blood products discussed with patient and other who consented to blood products.      Patient's NPO status is appropriate for Anesthesia.           (Risk and Benefits discussed )

## 2024-07-11 NOTE — Assessment & Plan Note (Addendum)
 History of CABG x1   Continue aspirin , atorvastatin , metoprolol 

## 2024-07-11 NOTE — Assessment & Plan Note (Addendum)
 Known history of small-cell carcinoma, planned for outpatient chemotherapy with carboplatin/etoposide/tecenteriq  Oncology consulted, recommended to withhold current chemotherapy regimen until acute illness resolves

## 2024-07-11 NOTE — Anesthesia Postprocedure Evaluation (Signed)
 Anesthesia Post Op Evaluation    Patient: [name]  Procedure(s):  ROBOTIC CHOLECYSTECTOMY LAPAROSCOPIC    Last Vitals:Temperature: 36.6 C (97.9 F) (07/11/24 1024)  Heart Rate: 88 (07/11/24 1024)  BP (Non-Invasive): 137/76 (07/11/24 1024)  Respiratory Rate: 18 (07/11/24 1024)  SpO2: 92 % (07/11/24 1024)    No notable events documented.    [name] sufficiently recovered from the effects of anesthesia to participate in the evaluation and has returned to their pre-procedure level.  Patient location during evaluation: PACU       Patient participation: complete - patient participated  Level of consciousness: awake and alert and responsive to verbal stimuli    Pain management: adequate  Airway patency: patent    Anesthetic complications: no  Cardiovascular status: acceptable  Respiratory status: acceptable  Hydration status: acceptable  Patient post-procedure temperature: Pt Normothermic   PONV Status: Absent

## 2024-07-11 NOTE — Assessment & Plan Note (Signed)
 S/p laparoscopic cholecystectomy this morning   Appreciate General surgery input

## 2024-07-12 ENCOUNTER — Inpatient Hospital Stay (HOSPITAL_COMMUNITY)

## 2024-07-12 DIAGNOSIS — K8010 Calculus of gallbladder with chronic cholecystitis without obstruction: Secondary | ICD-10-CM

## 2024-07-12 DIAGNOSIS — Z9889 Other specified postprocedural states: Secondary | ICD-10-CM

## 2024-07-12 DIAGNOSIS — M7981 Nontraumatic hematoma of soft tissue: Secondary | ICD-10-CM

## 2024-07-12 DIAGNOSIS — T148XXA Other injury of unspecified body region, initial encounter: Secondary | ICD-10-CM

## 2024-07-12 LAB — SURGICAL PATHOLOGY SPECIMEN

## 2024-07-12 LAB — BASIC METABOLIC PANEL
ANION GAP: 20 mmol/L — ABNORMAL HIGH (ref 7–18)
BUN/CREA RATIO: 42
BUN: 35 mg/dL — ABNORMAL HIGH (ref 8–23)
CALCIUM: 9.1 mg/dL (ref 8.3–10.7)
CHLORIDE: 105 mmol/L (ref 96–106)
CO2 TOTAL: 19 mmol/L — ABNORMAL LOW (ref 22–30)
CREATININE: 0.84 mg/dL (ref 0.50–0.90)
ESTIMATED GFR: 76 mL/min/1.73mˆ2 — ABNORMAL LOW (ref >90)
GLUCOSE: 140 mg/dL — ABNORMAL HIGH (ref 74–109)
POTASSIUM: 4.2 mmol/L (ref 3.2–5.0)
SODIUM: 144 mmol/L (ref 133–144)

## 2024-07-12 LAB — CBC
HCT: 21.3 % — ABNORMAL LOW (ref 34.8–46.0)
HCT: 26.4 % — ABNORMAL LOW (ref 34.8–46.0)
HGB: 6.2 g/dL — CL (ref 11.5–16.0)
HGB: 8.5 g/dL — ABNORMAL LOW (ref 11.5–16.0)
MCH: 28.1 pg (ref 26.0–32.0)
MCH: 28.1 pg (ref 26.0–32.0)
MCHC: 29.6 g/dL — ABNORMAL LOW (ref 31.0–35.5)
MCHC: 32.2 g/dL (ref 31.0–35.5)
MCV: 95.1 fL (ref 78.0–100.0)
MCV: 87.1 fL (ref 78.0–100.0)
MPV: 9.9 fL (ref 8.7–12.5)
MPV: 10.7 fL (ref 8.7–12.5)
PLATELETS: 45 x10ˆ3/uL — ABNORMAL LOW (ref 150–400)
PLATELETS: 41 x10ˆ3/uL — ABNORMAL LOW (ref 150–400)
RBC: 2.24 x10ˆ6/uL — ABNORMAL LOW (ref 3.85–5.22)
RBC: 3.03 x10ˆ6/uL — ABNORMAL LOW (ref 3.85–5.22)
RDW-CV: 15.9 % — ABNORMAL HIGH (ref 11.5–15.5)
RDW-CV: 15.5 % (ref 11.5–15.5)
WBC: 9.8 x10ˆ3/uL (ref 3.7–11.0)
WBC: 8.4 x10ˆ3/uL (ref 3.7–11.0)

## 2024-07-12 MED ORDER — CEFPODOXIME 200 MG TABLET
400.0000 mg | ORAL_TABLET | Freq: Two times a day (BID) | ORAL | Status: DC
Start: 2024-07-12 — End: 2024-07-16
  Administered 2024-07-12 – 2024-07-16 (×8): 400 mg via ORAL
  Filled 2024-07-12 (×10): qty 2

## 2024-07-12 MED ORDER — SODIUM CHLORIDE 0.9 % INTRAVENOUS SOLUTION
10.0000 mg | Freq: Once | INTRAVENOUS | Status: AC
Start: 2024-07-12 — End: 2024-07-12
  Administered 2024-07-12: 0 mg via INTRAVENOUS
  Administered 2024-07-12: 10 mg via INTRAVENOUS
  Filled 2024-07-12: qty 1

## 2024-07-12 MED ORDER — SODIUM CHLORIDE 0.9 % IV BOLUS
40.0000 mL | INJECTION | Freq: Once | Status: AC | PRN
Start: 2024-07-12 — End: 2024-07-13

## 2024-07-12 MED ORDER — PANTOPRAZOLE 40 MG TABLET,DELAYED RELEASE
40.0000 mg | DELAYED_RELEASE_TABLET | Freq: Every day | ORAL | Status: DC
Start: 2024-07-13 — End: 2024-07-17
  Administered 2024-07-13 – 2024-07-14 (×2): 40 mg via ORAL
  Administered 2024-07-15: 0 mg via ORAL
  Administered 2024-07-16: 40 mg via ORAL
  Filled 2024-07-12 (×3): qty 1

## 2024-07-12 NOTE — Care Plan (Addendum)
 Lap site with pressure dressing intact with small amount blood on dressing. Changed once this morning. Patient required Norco for headache last night with relief after. Up to bathroom twice and is urinating well and had small BM. Scd's on bilaterally. Sinus on tele and sats well on 4 liters oxygen nasal cannula. Is a 1 person assist up to bathroom.   Berwyn Rhyme, RN  07/12/2024 06:09   Problem: Wound  Goal: Optimal Functional Ability  Outcome: Ongoing (see interventions/notes)  Intervention: Optimize Functional Ability  Recent Flowsheet Documentation  Taken 07/11/2024 2030 by Berwyn MATSU, RN  Activity Management: patient refused/activity encouraged  Activity Assistance Provided: assistance, 1 person  Assistive Device Utilized: Maxi Slide Sheets (<136 kg)     Problem: Adult Inpatient Plan of Care  Goal: Plan of Care Review  Outcome: Ongoing (see interventions/notes)  Goal: Patient-Specific Goal (Individualized)  Outcome: Ongoing (see interventions/notes)  Flowsheets (Taken 07/11/2024 2030)  Individualized Care Needs: assist to bathroom  Anxieties, Fears or Concerns: none voiced  Goal: Absence of Hospital-Acquired Illness or Injury  Outcome: Ongoing (see interventions/notes)  Intervention: Identify and Manage Fall Risk  Recent Flowsheet Documentation  Taken 07/12/2024 0600 by Berwyn MATSU, RN  Safety Promotion/Fall Prevention:   activity supervised   fall prevention program maintained   nonskid shoes/slippers when out of bed   safety round/check completed  Taken 07/12/2024 0400 by Berwyn MATSU, RN  Safety Promotion/Fall Prevention:   activity supervised   fall prevention program maintained   nonskid shoes/slippers when out of bed   safety round/check completed  Taken 07/12/2024 0200 by Berwyn MATSU, RN  Safety Promotion/Fall Prevention:   activity supervised   fall prevention program maintained   nonskid shoes/slippers when out of bed   safety round/check completed  Taken 07/12/2024 0000 by Berwyn MATSU, RN  Safety Promotion/Fall  Prevention:   fall prevention program maintained   activity supervised   safety round/check completed  Taken 07/11/2024 2200 by Berwyn MATSU, RN  Safety Promotion/Fall Prevention:   activity supervised   fall prevention program maintained   nonskid shoes/slippers when out of bed   safety round/check completed  Taken 07/11/2024 2030 by Berwyn MATSU, RN  Safety Promotion/Fall Prevention:   activity supervised   fall prevention program maintained   nonskid shoes/slippers when out of bed   safety round/check completed  Taken 07/11/2024 2000 by Berwyn MATSU, RN  Safety Promotion/Fall Prevention:   nonskid shoes/slippers when out of bed   activity supervised   fall prevention program maintained   safety round/check completed  Intervention: Prevent Skin Injury  Recent Flowsheet Documentation  Taken 07/11/2024 2030 by Berwyn MATSU, RN  Body Position: supine, head elevated  Skin Protection: adhesive use limited  Intervention: Prevent and Manage VTE (Venous Thromboembolism) Risk  Recent Flowsheet Documentation  Taken 07/11/2024 2030 by Berwyn MATSU, RN  Sequential Compression Device (SCDs): Sequential compression device on  Taken 07/11/2024 2000 by Berwyn MATSU, RN  Sequential Compression Device (SCDs): Sequential compression device on  Intervention: Prevent Infection  Recent Flowsheet Documentation  Taken 07/11/2024 2030 by Berwyn MATSU, RN  Infection Prevention:   promote handwashing   rest/sleep promoted   cohorting utilized   personal protective equipment utilized   visitors restricted/screened   single patient room provided  Goal: Optimal Comfort and Wellbeing  Outcome: Ongoing (see interventions/notes)  Intervention: Provide Person-Centered Care  Recent Flowsheet Documentation  Taken 07/11/2024 2030 by Berwyn MATSU, RN  Trust Relationship/Rapport:   care explained   choices provided   questions answered  questions encouraged   reassurance provided  Goal: Rounds/Family Conference  Outcome: Ongoing (see interventions/notes)     Problem: Adult Inpatient  Plan of Care  Goal: Absence of Hospital-Acquired Illness or Injury  Intervention: Prevent Skin Injury  Recent Flowsheet Documentation  Taken 07/11/2024 2030 by Berwyn MATSU, RN  Body Position: supine, head elevated  Skin Protection: adhesive use limited     Problem: Adult Inpatient Plan of Care  Goal: Absence of Hospital-Acquired Illness or Injury  Intervention: Prevent and Manage VTE (Venous Thromboembolism) Risk  Recent Flowsheet Documentation  Taken 07/11/2024 2030 by Berwyn MATSU, RN  Sequential Compression Device (SCDs): Sequential compression device on  Taken 07/11/2024 2000 by Berwyn MATSU, RN  Sequential Compression Device (SCDs): Sequential compression device on

## 2024-07-12 NOTE — Assessment & Plan Note (Signed)
 History of CABG x1   Continue aspirin , atorvastatin , metoprolol 

## 2024-07-12 NOTE — Assessment & Plan Note (Signed)
 S/p laparoscopic cholecystectomy on 10/02  Tolerating regular diet   Appreciate General surgery input

## 2024-07-12 NOTE — Assessment & Plan Note (Signed)
S/p mastectomy 

## 2024-07-12 NOTE — Assessment & Plan Note (Addendum)
 Acute blood loss anemia or acute on chronic anemia, and mild bleeding from 1 of the laparoscopic incisions  Likely secondary to acute illness, bleeding or metastatic disease  Hemoglobin further dropped to 6.2  Given the bleeding and abnormal liver function test, ordered vitamin K 10 mg IV x1 dose  Will transfuse 2 units PRBC today, INR 1.1  Anemia panel not suggestive of iron  deficiency  Stool occult blood   Monitor CBC and transfuse for hemoglobin less than 7  Hematology is following, appreciate input  Given the significant drop in hemoglobin, ordered CT abdomen/pelvis to rule out retroperitoneal/intraperitoneal bleeding

## 2024-07-12 NOTE — Assessment & Plan Note (Signed)
 Worsening metastatic liver disease likely causing elevated transaminases

## 2024-07-12 NOTE — Assessment & Plan Note (Signed)
 Likely due to COVID and possible superimposed bacterial pneumonia  Currently on 2 L nasal cannula  Respiratory panel positive for COVID  CT angiogram did not show any evidence pulmonary embolism although marginal increase in the size of infrahilar mass, right hilar and mediastinal lymphadenopathy.  Continue IV Decadron    Continue empiric IV Rocephin  Continue Brovana and Pulmicort   Pulmonary is following, appreciate input

## 2024-07-12 NOTE — Assessment & Plan Note (Signed)
 Known history of small-cell carcinoma, planned for outpatient chemotherapy with carboplatin/etoposide/tecenteriq  Oncology is following, recommended to withhold current chemotherapy regimen until acute illness resolves

## 2024-07-12 NOTE — Assessment & Plan Note (Signed)
 Pulmonary is following.

## 2024-07-12 NOTE — Progress Notes (Addendum)
 [name] Progress Note    [name]       68 y.o.       Date of service: 07/12/2024  Date of Admission:  07/08/2024    Hospital Day:  LOS: 4 days   CC: Acute hypoxic respiratory failure (CMS HCC)    Subjective:  Patient was seen and examined at bedside this morning.  She developed bleeding from 1 of the laparoscopic incision on the right side of the abdomen yesterday.  Pressure dressing was applied and patient was monitored.  When I saw her this morning gauze was stained with blood.  Her hemoglobin dropped to 6.2 this morning from 7.6.  She is also hypotensive today.    Objective:   Filed Vitals:    07/12/24 1316 07/12/24 1325 07/12/24 1330 07/12/24 1335   BP: (!) 88/62 103/63 (!) 93/54 93/79   Pulse: 92 87 88 88   Resp: 16 16 16 16    Temp: 36.4 C (97.5 F) 36.4 C (97.5 F) 36.5 C (97.7 F) 36.5 C (97.7 F)   SpO2: 96% 97% 98% 97%     Oxygen Device  SpO2: 97 %  Flow (L/min) (Oxygen Therapy): 2    acetaminophen  (TYLENOL ) tablet, 650 mg, Oral, Q4H PRN  albuterol  (PROVENTIL ) 2.5 mg / 3 mL (0.083%) neb solution, 2.5 mg, Nebulization, 4x/day  aluminum-magnesium  hydroxide-simethicone (MAG-AL PLUS) 200-200-20 mg per 5 mL oral liquid, 30 mL, Oral, Q4H PRN  anastrozole  (ARIMIDEX ) tablet, 1 mg, Oral, Daily  budesonide (PULMICORT RESPULES) 0.5 mg/2 mL nebulizer suspension, 1 mg, Nebulization, 2x/day   And  arformoterol (BROVANA) 15 mcg/2 mL nebulizer solution, 15 mcg, Nebulization, 2x/day  aspirin  chewable tablet 81 mg, 81 mg, Oral, Daily  atorvastatin  (LIPITOR) tablet, 40 mg, Oral, Daily  benzonatate (TESSALON) capsule, 100 mg, Oral, Q8H PRN  cefpodoxime (VANTIN) tablet, 400 mg, Oral, 2x/day-Food  cholecalciferol  (VITAMIN D3) 1000 unit (25 mcg) tablet, 1,000 Units, Oral, Daily  clonazePAM  (klonoPIN ) tablet, 0.5 mg, Oral, 2x/day PRN  NS 250 mL flush bag, , Intravenous, Q15 Min PRN   And  D5W 250 mL flush bag, , Intravenous, Q15 Min PRN  dexAMETHasone  (DECADRON ) tablet 6 mg, 6 mg,  Oral, Daily  diphenhydrAMINE (BENADRYL) capsule, 25 mg, Oral, HS PRN  DULoxetine  (CYMBALTA ) delayed release capsule, 60 mg, Oral, Daily  [Held by provider] enoxaparin PF (LOVENOX) 40 mg/0.4 mL SubQ injection, 40 mg, Subcutaneous, Q24H  fluticasone  (FLONASE ) 50 mcg per spray nasal spray, 1 Spray, Each Nostril, Daily PRN  folic acid  (FOLVITE ) tablet, 1 mg, Oral, Daily  gabapentin  (NEURONTIN ) capsule, 400 mg, Oral, 3x/day  HYDROcodone -acetaminophen  (NORCO) 5-325 mg per tablet, 1 Tablet, Oral, Q6H PRN  losartan  (COZAAR ) tablet, 25 mg, Oral, Daily  magnesium  hydroxide (MILK OF MAGNESIA) 400mg  per 5mL oral liquid, 15 mL, Oral, 4x/day PRN  metoprolol  tartrate (LOPRESSOR ) tablet, 25 mg, Oral, 2x/day  multivitamin-minerals-iron  oral liquid, 15 mL, Oral, Daily  NS flush syringe, 3 mL, Intracatheter, Q8HRS  NS flush syringe, 3 mL, Intracatheter, Q1H PRN  OLANZapine  (zyPREXA ) tablet, 5 mg, Oral, NIGHTLY  omega-3 fatty acids (LOVAZA ) capsule, 1 g, Oral, Daily  ondansetron  (ZOFRAN ) 2 mg/mL injection, 4 mg, Intravenous, Q6H PRN  [START ON 07/13/2024] pantoprazole  (PROTONIX ) delayed release tablet, 40 mg, Oral, Daily Before Lunch  polyethylene glycol (MIRALAX) oral packet, 17 g, Oral, Daily  prochlorperazine  (COMPAZINE ) tablet, 10 mg, Oral, 3x/day PRN  traZODone  (DESYREL ) tablet, 150 mg, Oral, NIGHTLY        Physical Exam:  General:  No acute distress  Cardiac: Regular rate and rhythm, no murmur auscultated.  Respiratory: Clear to auscultation bilaterally , no wheeze, rhonchi  Abdomen:soft, right mid abdomen laparoscopic incisions covered with gauze which is slightly stained with blood, expected minimal tenderness present in right upper quadrant, no organomegaly  Neurological: Alert and Orient X 3; No gross focal neurological deficits  Extremities: No peripheral edema noted     Results for orders placed or performed during the hospital encounter of 07/08/24 (from the past 24 hours)   BASIC METABOLIC PANEL    Collection Time: 07/12/24   8:39 AM   Result Value Ref Range    SODIUM 144 133 - 144 mmol/L    POTASSIUM 4.2 3.2 - 5.0 mmol/L    CHLORIDE 105 96 - 106 mmol/L    CO2 TOTAL 19 (L) 22 - 30 mmol/L    ANION GAP 20 (H) 7 - 18 mmol/L    CALCIUM 9.1 8.3 - 10.7 mg/dL    GLUCOSE 859 (H) 74 - 109 mg/dL    BUN 35 (H) 8 - 23 mg/dL    CREATININE 9.15 9.49 - 0.90 mg/dL    BUN/CREA RATIO 42     ESTIMATED GFR 76 (L) >90 mL/min/1.84m^2   CBC    Collection Time: 07/12/24  8:39 AM   Result Value Ref Range    WBC 9.8 3.7 - 11.0 x10^3/uL    RBC 2.24 (L) 3.85 - 5.22 x10^6/uL    HGB 6.2 (LL) 11.5 - 16.0 g/dL    HCT 78.6 (L) 65.1 - 46.0 %    MCV 95.1 78.0 - 100.0 fL    MCH 28.1 26.0 - 32.0 pg    MCHC 29.6 (L) 31.0 - 35.5 g/dL    RDW-CV 84.0 (H) 88.4 - 15.5 %    PLATELETS 45 (L) 150 - 400 x10^3/uL    MPV 9.9 8.7 - 12.5 fL    Narrative    HemoglobinTest(s) repeated and results duplicated.   TYPE AND SCREEN    Collection Time: 07/12/24 10:14 AM   Result Value Ref Range    UNITS ORDERED 2     ABO/RH(D) O POSITIVE     ANTIBODY SCREEN NEGATIVE     SPECIMEN EXPIRATION DATE 07/15/2024,2359    CROSSMATCH RED CELLS - UNITS , 2 Units    Collection Time: 07/12/24 10:14 AM   Result Value Ref Range    Coding System [id number]     UNIT NUMBER [id number]     BLOOD COMPONENT TYPE LR RBC Adsol3, 04741     UNIT DIVISION 00     UNIT DISPENSE STATUS ISSUED     TRANSFUSION STATUS OK TO TRANSFUSE     IS CROSSMATCH Electronically Compatible     Product Code [id number]     Coding System [id number]     UNIT NUMBER [id number]     BLOOD COMPONENT TYPE LR RBC, Adsol1, 04710     UNIT DIVISION 00     UNIT DISPENSE STATUS ALLOCATED     TRANSFUSION STATUS OK TO TRANSFUSE     IS CROSSMATCH Electronically Compatible     Product Code [phone]          Micro:   Hospital Encounter on 07/08/24 (from the past 96 hours)   EXTENDED RESPIRATORY VIRUS PANEL    Collection Time: 07/08/24  2:42 PM    Specimen: Nasopharyngeal Swab   Culture Result Status    ADENOVIRUS ARRAY Not Detected Final    CORONAVIRUS 229E Not  Detected Final  CORONAVIRUS HKU1 Not Detected Final    CORONAVIRUS NL63 Not Detected Final    CORONAVIRUS OC43 Not Detected Final    SARS CORONAVIRUS 2 (SARS-CoV-2) Detected (A) Final    METAPNEUMOVIRUS ARRAY Not Detected Final    RHINOVIRUS/ENTEROVIRUS ARRAY Not Detected Final    INFLUENZA A Not Detected Final    INFLUENZA B ARRAY Not Detected Final    PARAINFLUENZA 1 ARRAY Not Detected Final    PARAINFLUENZA 2 ARRAY Not Detected Final    PARAINFLUENZA 3 ARRAY Not Detected Final    PARAINFLUENZA 4 ARRAY Not Detected Final    RSV ARRAY Not Detected Final    BORDETELLA PARAPERTUSSIS (IS 1001) Not Detected Final    BORDETELLA PERTUSSIS ARRAY Not Detected Final    CHLAMYDOPHILA PNEUMONIAE ARRAY Not Detected Final    MYCOPLASMA PNEUMONIAE ARRAY Not Detected Final   RESPIRATORY CULTURE AND GRAM STAIN, AEROBIC    Collection Time: 07/10/24  1:10 PM    Specimen: Sputum; Other    Narrative    The following orders were created for panel order RESPIRATORY CULTURE AND GRAM STAIN, AEROBIC.  Procedure                               Abnormality         Status                     ---------                               -----------         ------                     SPUTUM [id number]]                                    Final result                 Please view results for these tests on the individual orders.   RESPIRATORY CULTURE AND GRAM STAIN (PERFORMABLE)    Collection Time: 07/10/24  1:10 PM    Specimen: Sputum   Culture Result Status    RESPIRATORY CULTURE Light Growth Yeast (A) Preliminary    GRAM STAIN 1+ Rare Epithelial Cells Preliminary    GRAM STAIN 1+ Rare WBCs Preliminary    GRAM STAIN 2+ Few Yeast Preliminary   STREP PNEUMONIAE AND LEGIONELLA ANTIGEN, URINE    Collection Time: 07/10/24  1:15 PM    Specimen: Urine, Site not specified   Culture Result Status    S.PNEUMONIAE ANTIGEN Negative Final    LEGIONELLA ANTIGEN Negative Final       Radiology:           Assessment/ Plan:   Assessment & Plan  Acute hypoxic  respiratory failure (CMS HCC)  Pneumonia due to COVID-19 virus  Likely due to COVID and possible superimposed bacterial pneumonia  Currently on 2 L nasal cannula  Respiratory panel positive for COVID  CT angiogram did not show any evidence pulmonary embolism although marginal increase in the size of infrahilar mass, right hilar and mediastinal lymphadenopathy.  Continue IV Decadron    Continue empiric IV Rocephin  Continue Brovana and Pulmicort   Pulmonary is following, appreciate input    Acute cholecystitis  Thickening  of wall of gallbladder with pericholecystic fluid  S/p laparoscopic cholecystectomy on 10/02  Tolerating regular diet   Appreciate General surgery input     Acute blood loss anemia  Acute blood loss anemia or acute on chronic anemia, and mild bleeding from 1 of the laparoscopic incisions  Likely secondary to acute illness, bleeding or metastatic disease  Hemoglobin further dropped to 6.2  Given the bleeding and abnormal liver function test, ordered vitamin K 10 mg IV x1 dose  Will transfuse 2 units PRBC today, INR 1.1  Anemia panel not suggestive of iron  deficiency  Stool occult blood   Monitor CBC and transfuse for hemoglobin less than 7  Hematology is following, appreciate input  Given the significant drop in hemoglobin, ordered CT abdomen/pelvis to rule out retroperitoneal/intraperitoneal bleeding    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)  Malignant neoplasm of lung, unspecified laterality, unspecified part of lung (CMS HCC)  Known history of small-cell carcinoma, planned for outpatient chemotherapy with carboplatin/etoposide/tecenteriq  Oncology is following, recommended to withhold current chemotherapy regimen until acute illness resolves     CAD (coronary artery disease)  History of CABG x1   Continue aspirin , atorvastatin , metoprolol     Hypertension  Pressure control  Continue losartan , metoprolol   Paroxysmal atrial fibrillation (CMS HCC)  Rate controlled, continue  metoprolol , not on anticoagulation  History of breast cancer  S/p mastectomy  Metastatic cancer to liver  Worsening metastatic liver disease likely causing elevated transaminases  Transaminitis  Likely secondary to liver metastasis  Pulmonary HTN (CMS HCC)  Pulmonary is following  Mood disorder (CMS HCC)  Continue trazodone   Cigarette smoker       Deep vein thrombosis prophylaxis-SCDs due to anemia      Candyce Salles, MD

## 2024-07-12 NOTE — Care Plan (Signed)
 Problem: Wound  Goal: Optimal Coping  Outcome: Ongoing (see interventions/notes)  Goal: Optimal Functional Ability  Outcome: Ongoing (see interventions/notes)  Goal: Absence of Infection Signs and Symptoms  Outcome: Ongoing (see interventions/notes)  Goal: Improved Oral Intake  Outcome: Ongoing (see interventions/notes)  Goal: Optimal Pain Control and Function  Outcome: Ongoing (see interventions/notes)  Goal: Skin Health and Integrity  Outcome: Ongoing (see interventions/notes)  Goal: Optimal Wound Healing  Outcome: Ongoing (see interventions/notes)

## 2024-07-12 NOTE — Assessment & Plan Note (Signed)
 Likely secondary to liver metastasis

## 2024-07-12 NOTE — Pharmacy (Signed)
 [facility]  Department of Pharmaceutical Services  Pharmacist Protocol Order: IV to PO Therapy Conversion   07/12/2024      [name], [name], 68 y.o. female  Date of Birth:  09/24/1956  Date of Admission:  07/08/2024  MRN# [id number]    The Medical Executive Committee at [facility] has granted pharmacists the ability to change therapy from IV to PO for medications which have good oral absorption in patients who can tolerate oral therapy.  The following order has been changed. The switch to oral therapy doesn't indicate that the medical severity or intensity of the patient has decreased.     Original Order: Ceftriaxone (ROCEPHIN) 2000 mg , IV, Every 24 hours    New Order: Cefpodoxime (VANTIN) 400 mg , Oral, 2 times daily    Please contact Pharmacy with any questions or concerns. Thank you.    MICAEL Eva Cobia, PharmD  Infectious Diseases Clinical Pharmacist  x [phone]  07/12/2024, 10:42

## 2024-07-12 NOTE — Assessment & Plan Note (Signed)
 Continue trazodone

## 2024-07-12 NOTE — Progress Notes (Signed)
 [place] [facility] Coverage Note          Name:  [name] MRN:  [id number]   Admission Date: 07/08/2024   DOB:  19-Apr-1956 (68 y.o.)   Date of service: 07/13/2024       Received notification from daytime attending that patient had a hemoglobin of 6.2 and subsequent transfusion of 2 units of packed red blood cells earlier today.  CT scan of the abdomen/pelvis was ordered and appears to show a hematoma as well as some blood in the pelvis.  They asked for me to call the surgeon.  I reviewed the chart to find that patient was hemodynamically stable.  I looked at the CT scan myself.  I discussed the case with the nursing staff reports that patient continues to ooze blood despite dressing changes and pressure dressings to the right lower abdominal surgical incision.  Spoke with Dr. Lucente, surgeon, who reviewed CT with me. He states that when he opened patient up, her liver had been pretty well replaced by tumor. She will have associated coagulopathy 2/t this disease process.  He says he does not believe it is a surgical process, as he believes this fluid in the pelvis/lower abd is more ascites related. He recommends some FFP, platelets. He recommends repeating coags, labs in the AM. He asked me to call him if needed.    Overnight Coverage Note:  Otherwise no change in plan of care.  Continue current medical management and supportive care.  Will defer further management decisions to primary team in the morning.    Anaid Haney Danielle Amorita Vanrossum, APRN, CNP  07/13/2024, 00:50

## 2024-07-12 NOTE — Care Management Notes (Signed)
 Pt was asleep at the time of the visit, left copy of IM letter in pt room. Pt family were present and they understood the letter.

## 2024-07-12 NOTE — Consults (Signed)
 [facility]  Hematology/Oncology    Consult progress Note      [name], [name], 68 y.o. female  Date of Birth:  November 16, 1955  Inpatient Admission Date:  07/08/2024  Date of Service: 07/12/2024    Chief Complaint:  Known patient - history of small-cell lung CA with liver metastases    Interim HPI:  Sitting, rest on side of bed.  No acute events reported overnight.  Does endorse some abdominal discomfort.  S/p cholecystectomy on 07/11/2024.  HGB and platelet counts remain low.  She is to receive PRBC transfusion x2 today, 07/12/2024.    HPI:  This is a 68 year old female who presented outpatient infusion center today for Tecentriq/etoposide/carbo treatment when she reported worsening dyspnea to nursing staff.  She was found to be 89% on room air.  Was then placed on 2 L nasal cannula O2 which increased her to 94% oxygenation.  Also c/o worsening generalized pain that is worse in the legs as well as productive cough with yellow sputum.  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    Review of Systems:   Review of Systems   Constitutional:  Positive for fatigue. Negative for activity change, appetite change, chills, diaphoresis, fever and unexpected weight change.   HENT:  Negative for congestion, ear pain, facial swelling, hearing loss, mouth sores, nosebleeds, sinus pain, sore throat, tinnitus, trouble swallowing and voice change.    Eyes:  Negative for photophobia, pain and visual disturbance.   Respiratory:  Positive for cough and shortness of breath. Negative for apnea, choking, chest tightness, wheezing and stridor.    Cardiovascular:  Negative for chest pain, palpitations and leg swelling.   Gastrointestinal:  Negative for abdominal distention, abdominal pain, anal bleeding, blood in stool, constipation, diarrhea, nausea, rectal pain and vomiting.   Endocrine: Negative for cold intolerance and heat intolerance.   Genitourinary:   Negative for decreased urine volume, difficulty urinating, dysuria, flank pain, frequency, hematuria and urgency.   Musculoskeletal:  Positive for arthralgias and myalgias. Negative for back pain, gait problem, joint swelling and neck pain.   Skin:  Negative for color change, pallor, rash and wound.   Allergic/Immunologic: Negative for immunocompromised state.   Neurological:  Negative for dizziness, syncope, facial asymmetry, speech difficulty, weakness, light-headedness, numbness and headaches.   Hematological:  Negative for adenopathy. Does not bruise/bleed easily.   Psychiatric/Behavioral:  Negative for agitation, behavioral problems and confusion.      Past Medical History:   Diagnosis Date    Abdominal pain     Cancer (CMS HCC)     Chest pain     Chronic lung disease     Coronary artery disease     Dyslipidemia     Essential hypertension     Heart disease     History of percutaneous coronary intervention     History of stress test     Mixed hyperlipidemia     Unspecified chronic bronchitis      Past Surgical History:   Procedure Laterality Date    CARDIAC CATHETERIZATION      CORONARY ANGIOPLASTY      CORONARY ARTERY STENT PLACEMENT      HX BACK SURGERY      HX CORONARY ARTERY BYPASS GRAFT      HX MASTECTOMY, SIMPLE Bilateral     HX TUBAL LIGATION      NECK SURGERY       Social  History     Socioeconomic History    Marital status: Widowed     Spouse name: Not on file    Number of children: Not on file    Years of education: Not on file    Highest education level: Not on file   Occupational History    Not on file   Tobacco Use    Smoking status: Every Day     Current packs/day: 0.50     Average packs/day: 0.5 packs/day for 52.8 years (26.4 ttl pk-yrs)     Types: Cigarettes     Start date: 49    Smokeless tobacco: Never   Vaping Use    Vaping status: Never Used   Substance and Sexual Activity    Alcohol use: Yes     Comment: occasional    Drug use: Yes     Types: Marijuana    Sexual activity: Not on file    Other Topics Concern    Not on file   Social History Narrative    Not on file     Social Determinants of Health     Financial Resource Strain: Not on file   Transportation Needs: Not on file   Social Connections: Low Risk (07/09/2024)    Social Connections     SDOH Social Isolation: 5 or more times a week   Intimate Partner Violence: Not on file   Housing Stability: Not on file      Family Medical History:       Problem Relation (Age of Onset)    Arthritis Mother, Maternal Grandmother    Cancer Father, Other    Heart Attack Father, Other    Heart Disease Brother, Other    Hypertension (High Blood Pressure) Mother, Father, Other    Pacemaker Mother          Vital Signs:  Temp  Avg: 36.6 C (97.9 F)  Min: 36.5 C (97.7 F)  Max: 36.7 C (98.1 F)    Pulse  Avg: 83.1  Min: 77  Max: 88 BP  Min: 93/63  Max: 157/66   Resp  Avg: 18  Min: 16  Max: 22 SpO2  Avg: 97 %  Min: 92 %  Max: 100 %          Input/Output    Intake/Output Summary (Last 24 hours) at 07/12/2024 0919  Last data filed at 07/11/2024 1700  Gross per 24 hour   Intake 181 ml   Output --   Net 181 ml    I/O last shift:  No intake/output data recorded.     LABS:   Results for orders placed or performed during the hospital encounter of 07/08/24 (from the past 24 hours)   BASIC METABOLIC PANEL   Result Value Ref Range    SODIUM 144 133 - 144 mmol/L    POTASSIUM 4.2 3.2 - 5.0 mmol/L    CHLORIDE 105 96 - 106 mmol/L    CO2 TOTAL 19 (L) 22 - 30 mmol/L    ANION GAP 20 (H) 7 - 18 mmol/L    CALCIUM 9.1 8.3 - 10.7 mg/dL    GLUCOSE 859 (H) 74 - 109 mg/dL    BUN 35 (H) 8 - 23 mg/dL    CREATININE 9.15 9.49 - 0.90 mg/dL    BUN/CREA RATIO 42     ESTIMATED GFR 76 (L) >90 mL/min/1.71m^2   CBC   Result Value Ref Range    WBC 9.8 3.7 - 11.0 x10^3/uL  RBC 2.24 (L) 3.85 - 5.22 x10^6/uL    HGB 6.2 (LL) 11.5 - 16.0 g/dL    HCT 78.6 (L) 65.1 - 46.0 %    MCV 95.1 78.0 - 100.0 fL    MCH 28.1 26.0 - 32.0 pg    MCHC 29.6 (L) 31.0 - 35.5 g/dL    RDW-CV 84.0 (H) 88.4 - 15.5 %    PLATELETS  45 (L) 150 - 400 x10^3/uL    MPV 9.9 8.7 - 12.5 fL     IMAGES:  Results for orders placed or performed during the hospital encounter of 07/08/24 (from the past 72 hours)   US  RT UPPER QUADRANT     Status: None    Narrative    Morayma JEAN Gage    US  RT UPPER QUADRANT performed on 07/09/2024 2:47 PM.    INDICATION:  liver mets, worsening elevated lfts, pericholecystic fluid   Additional History:  liver mets, worsening elevated lfts, pericholecystic fluid    TECHNIQUE:  Transabdominal grayscale sonogram of the right upper quadrant, with color Doppler imaging.      COMPARISON:  06/14/2024  ___________________________________  FINDINGS:    The visualized pancreas is unremarkable; the tail is obscured.     The liver measures 21.4 along its right lateral margin and demonstrates diffuse increased hepatic echotexture.  No focal lesions are seen.  The portal vein is patent, with appropriate direction of flow on color Doppler and waveform imaging.    The common bile duct measures up to 7.6 mm in caliber, and there is no intrahepatic ductal dilation.    Gallbladder wall thickening with pericholecystic fluid.       GB wall thickness: 3.6    The right kidney measures 12.7 x 5.4 x 4.5, without hydronephrosis.    Trace ascites.  ___________________________________    Impression    Gallbladder wall thickening with pericholecystic fluid consistent with acute cholecystitis.    Dilated common bile duct without distal obstructing lesion identified.    Hepatomegaly with hepatic steatosis.    Trace ascites.          Radiologist location ID: [id number]     MRI MRCP WO CONTRAST     Status: None    Narrative    Zsofia JEAN Crepeau    MRI MRCP WO CONTRAST performed on 07/10/2024 12:54 PM.    INDICATION:  acute chole on imaging, cbd 7 mm, elevated lfts, r/o stone   Additional History:  Acute chole on imaging, cbd 7 mm, elevated lfts, rule out stone. Stomach pain.    Multiplanar, multisequence MRI performed without contrast.  3-D MIP images  obtained.    * Diffuse nodular foci of increased T2 signal throughout the enlarged liver.  * Portal and hepatic veins appear to be patent.  * Small amount of perihepatic fluid.  * No gallbladder wall thickening or stones. Pericholecystic fluid is present.  * Mild extrahepatic bile duct dilatation, measuring 9 mm coronal image 44 series 7.  * No pancreatic mass or pancreatic duct dilatation.  * Spleen is normal in size.  * Adrenal glands unremarkable.  * Several small T2 hyperintense renal lesions, most compatible with cysts.    * Heterogeneous T2 signal within the marrow, clinical assessment for anemia, there is scoliosis with degenerative disease.      Impression    * Mild extra hepatic bile duct dilatation as noted on CT 07/08/2024, no bile duct stone.    * No gallstones, there is pericholecystic fluid as well as  fluid adjacent to the liver.    * Diffuse heterogeneous appearance to the liver as described above, this could certainly represent findings associated with acute hepatitis, differential considerations include regenerative nodularity in chronic liver disease, metastatic disease/lymphoma, and possibly sarcoid          Radiologist location ID: [id number]        Radiologist location ID: [id number]       Physical Exam:  Physical Exam  Vitals reviewed.   Constitutional:       Appearance: She is normal weight. She is ill-appearing.   HENT:      Head: Normocephalic.      Nose: Nose normal.      Mouth/Throat:      Pharynx: Oropharynx is clear.   Eyes:      Extraocular Movements: Extraocular movements intact.      Conjunctiva/sclera: Conjunctivae normal.   Cardiovascular:      Rate and Rhythm: Normal rate.      Pulses: Normal pulses.   Pulmonary:      Effort: Pulmonary effort is normal.   Abdominal:      Palpations: Abdomen is soft.   Musculoskeletal:         General: Normal range of motion.      Cervical back: Normal range of motion.   Skin:     General: Skin is dry.   Neurological:      General: No focal  deficit present.      Mental Status: She is alert and oriented to person, place, and time. Mental status is at baseline.   Psychiatric:         Mood and Affect: Mood normal.         Behavior: Behavior normal.         Thought Content: Thought content normal.         Judgment: Judgment normal.       Problem List:  Active Hospital Problems   (*Primary Problem)    Diagnosis    *Acute hypoxic respiratory failure (CMS HCC)    Acute cholecystitis    Thickening of wall of gallbladder with pericholecystic fluid    Symptomatic anemia    COVID-19    Normocytic anemia    Transaminitis    Malignant neoplasm of lung, unspecified laterality, unspecified part of lung (CMS HCC)    Pulmonary HTN (CMS HCC)    Pneumonia due to COVID-19 virus    Mood disorder (CMS HCC)    Anemia, unspecified type    Cigarette smoker    Metastatic cancer to liver    History of breast cancer     Chronic    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)    Paroxysmal atrial fibrillation (CMS HCC)     Chronic    Hypertension     Chronic    CAD (coronary artery disease)      Assessment/Plan:    68 year old female presenting with acute hypoxic respiratory failure:    Acute hypoxic respiratory failure:  CXR revealing vague pulmonary opacities that could be mild PNA or atelectasis.  She is COVID positive.  CTA chest to rule out PE to be completed.  She is on IV antibiotics.    Small-cell lung CA with liver metastasis:  Follows with Medical Oncology on outpatient basis at [facility].  She was to receive carboplatin/etoposide/Tecentriq on 07/08/2024 when she presented with acute respiratory symptoms that prompted further evaluation by the emergency room as stated in the above  HPI.  Withhold current chemotherapy regimen until resolution of acute conditions.    Normocytic hypochromic anemia:   Current H/H = 6.2/21.3.  No acute blood loss noted.  Could be secondary to acute illness or metastatic disease.  Vitamin B12 normal at 1106.  Folate low at  3.5.  Started on daily folic acid  if not already taking.  Copper normal at 113.  Noted transaminitis as well as elevated alk-phos.  This is likely related to liver mets.  Continue to monitor counts.  Transfuse with PRBC if hemoglobin less than 7 grams/deciliter or greater than 7 and symptomatic.  Will benefit from blood transfusion.    Thrombocytopenia:  Current counts = 45,000.  No overt bruising or bleeding.  Likely secondary to underlying metastatic disease.  Defer further thrombocytopenia panel at this time.  Continue to monitor counts.  Transfuse with platelets if <50 K and actively bleeding or <10 K w/wo bleeding.    Cholelithiasis: General surgery is following.  She is s/p cholecystectomy  07/11/2024.    Hypertension: Managed with antihypertensives.    HLD:  On statin.    CAD:  History of CABG x1.  On cardioprotective medication, 81 mg ASA daily.      Attending note:  I have independently seen and examined the patient on the same date of service as our mid-level.  I agree with the plan of care as documented by my mid-level.  I have personally reviewed  the pertinent medications, labs  and radiographic studies  as documented in my mid-level note.  The case was discussed in detail with the midlevel provider.  I agree with the above note.  Some changes and additions were made.  I spent more than 50% of the total encounter time for the care of this patient and documentation.      Norleen Amen, MD

## 2024-07-12 NOTE — Consults (Signed)
 [facility] MEDICINE [facility]  [address]  [place] [address]     Consult  Follow Up Note    Rachel, [name], 68 y.o. female  Date of Birth:  May 29, 1956  Encounter Start Date:  07/08/2024  Inpatient Admission Date: 07/08/2024   Date of service: 07/12/24     Requesting MD: No ref. provider found     Reason for consultation: Acute cholecystitis    HPI:  [name] is a 68 y.o. female who was sent to the emergency department from infusion Center where she had presented to start chemotherapy.  She has a recent diagnosis of small cell carcinoma with liver Mets.  She complained of dyspnea and hypoxia and was diagnosed with COVID.  CT abdomen and pelvis with findings of hepatomegaly, hepatic steatosis and hepatic lesions, new pericholecystic fluid.  General surgery consulted for evaluation.    Subjective:  Patient resting in bed, no acute distress.  [name] postop day 1 for robotic cholecystectomy.  Abdomen is soft, mildly distended, expected postop tenderness with hypoactive bowel sounds.  Tolerating regular diet without nausea or vomiting.     Historical Data   Past Medical History:   Diagnosis Date    Abdominal pain     Cancer (CMS HCC)     Chest pain     Chronic lung disease     Coronary artery disease     Dyslipidemia     Essential hypertension     Heart disease     History of percutaneous coronary intervention     History of stress test     Mixed hyperlipidemia     Unspecified chronic bronchitis          Past Surgical History:   Procedure Laterality Date    CARDIAC CATHETERIZATION      CORONARY ANGIOPLASTY      CORONARY ARTERY STENT PLACEMENT      HX BACK SURGERY      HX CORONARY ARTERY BYPASS GRAFT      HX MASTECTOMY, SIMPLE Bilateral     HX TUBAL LIGATION      NECK SURGERY           Allergies[1]    Social History  Social History[2]         Current Outpatient Medications   Medication Instructions    anastrozole  (ARIMIDEX ) 1 mg, Daily    aspirin  81 mg, Oral, Daily     atorvastatin  (LIPITOR) 40 mg, Oral, Daily    cholecalciferol  (vitamin D3) 1,000 Units, Daily    clonazePAM  (KLONOPIN ) 0.5 mg, 2 TIMES DAILY PRN    DULoxetine  (CYMBALTA  DR) 60 mg, Daily    fluticasone  propion-salmeteroL (ADVAIR ) 250-50 mcg/dose Inhalation oral diskus inhaler 1 Inhalation, Inhalation, 2 TIMES DAILY    fluticasone  propionate (FLONASE ) 50 mcg/actuation Nasal Spray, Suspension 1 Spray, DAILY PRN    gabapentin  (NEURONTIN ) 400 mg, Oral, 3 TIMES DAILY    losartan  (COZAAR ) 25 mg, Oral, Daily    metoprolol  tartrate (LOPRESSOR ) 25 mg, 2 TIMES DAILY    oxyCODONE -acetaminophen  (PERCOCET) 7.5-325 mg Oral Tablet 1 Tablet, Oral, EVERY 4 HOURS PRN    prochlorperazine  (COMPAZINE ) 10 mg, Oral, 3 TIMES DAILY PRN    promethazine  (PHENERGAN ) 25 mg, Oral, EVERY 8 HOURS PRN    traZODone  (DESYREL ) 150 mg, NIGHTLY        acetaminophen  (TYLENOL ) tablet, 650 mg, Oral, Q4H PRN  albuterol  (PROVENTIL ) 2.5 mg / 3 mL (0.083%) neb solution, 2.5 mg, Nebulization, 4x/day  aluminum-magnesium  hydroxide-simethicone (MAG-AL PLUS) 200-200-20 mg per 5 mL oral liquid, 30 mL, Oral, Q4H PRN  anastrozole  (ARIMIDEX ) tablet, 1 mg, Oral, Daily  budesonide (PULMICORT RESPULES) 0.5 mg/2 mL nebulizer suspension, 1 mg, Nebulization, 2x/day   And  arformoterol (BROVANA) 15 mcg/2 mL nebulizer solution, 15 mcg, Nebulization, 2x/day  aspirin  chewable tablet 81 mg, 81 mg, Oral, Daily  atorvastatin  (LIPITOR) tablet, 40 mg, Oral, Daily  benzonatate (TESSALON) capsule, 100 mg, Oral, Q8H PRN  cefpodoxime (VANTIN) tablet, 400 mg, Oral, 2x/day-Food  cholecalciferol  (VITAMIN D3) 1000 unit (25 mcg) tablet, 1,000 Units, Oral, Daily  clonazePAM  (klonoPIN ) tablet, 0.5 mg, Oral, 2x/day PRN  NS 250 mL flush bag, , Intravenous, Q15 Min PRN   And  D5W 250 mL flush bag, , Intravenous, Q15 Min PRN  dexAMETHasone  (DECADRON ) tablet 6 mg, 6 mg, Oral, Daily  diphenhydrAMINE (BENADRYL) capsule, 25 mg, Oral, HS PRN  DULoxetine  (CYMBALTA ) delayed release capsule, 60 mg, Oral,  Daily  [Held by provider] enoxaparin PF (LOVENOX) 40 mg/0.4 mL SubQ injection, 40 mg, Subcutaneous, Q24H  fluticasone  (FLONASE ) 50 mcg per spray nasal spray, 1 Spray, Each Nostril, Daily PRN  folic acid  (FOLVITE ) tablet, 1 mg, Oral, Daily  gabapentin  (NEURONTIN ) capsule, 400 mg, Oral, 3x/day  HYDROcodone -acetaminophen  (NORCO) 5-325 mg per tablet, 1 Tablet, Oral, Q6H PRN  losartan  (COZAAR ) tablet, 25 mg, Oral, Daily  magnesium  hydroxide (MILK OF MAGNESIA) 400mg  per 5mL oral liquid, 15 mL, Oral, 4x/day PRN  metoprolol  tartrate (LOPRESSOR ) tablet, 25 mg, Oral, 2x/day  multivitamin-minerals-iron  oral liquid, 15 mL, Oral, Daily  NS flush syringe, 3 mL, Intracatheter, Q8HRS  NS flush syringe, 3 mL, Intracatheter, Q1H PRN  OLANZapine  (zyPREXA ) tablet, 5 mg, Oral, NIGHTLY  omega-3 fatty acids (LOVAZA ) capsule, 1 g, Oral, Daily  ondansetron  (ZOFRAN ) 2 mg/mL injection, 4 mg, Intravenous, Q6H PRN  [START ON 07/13/2024] pantoprazole  (PROTONIX ) delayed release tablet, 40 mg, Oral, Daily Before Lunch  polyethylene glycol (MIRALAX) oral packet, 17 g, Oral, Daily  prochlorperazine  (COMPAZINE ) tablet, 10 mg, Oral, 3x/day PRN  traZODone  (DESYREL ) tablet, 150 mg, Oral, NIGHTLY        Review of Systems:   All pertinent review of systems as addressed and detailed in HPI above    Vital Signs:  Patient Vitals for the past 24 hrs:   BP Temp Pulse Resp SpO2 Weight   07/12/24 1045 104/67 36.5 C (97.7 F) 85 16 93 % --   07/12/24 0840 -- -- -- 16 -- --   07/12/24 0754 -- -- 88 -- -- --   07/12/24 0724 108/67 36.6 C (97.9 F) 88 18 99 % --   07/12/24 0516 -- -- -- -- -- 66.2 kg (145 lb 15.1 oz)   07/12/24 0401 104/64 36.5 C (97.7 F) 85 16 99 % --   07/11/24 2349 -- -- -- 17 -- --   07/11/24 2258 93/63 36.7 C (98.1 F) 77 17 95 % --   07/11/24 1905 119/76 36.6 C (97.9 F) 79 18 97 % --        Physical Exam:  Physical Exam  Constitutional:       General: She is not in acute distress.  Cardiovascular:      Rate and Rhythm: Normal rate.    Pulmonary:      Comments: Reports some baseline shortness of breath  Abdominal:      Comments: Abdomen is soft, mildly distended, expected postop tenderness with hypoactive bowel sounds.    Skin:     General: Skin is warm  and dry.      Comments: Some bleeding noted from right lower quadrant trocar site, bandage applied   Neurological:      Mental Status: She is alert and oriented to person, place, and time.          Studies:  I have reviewed all available studies within the electronic medical record.    Results for orders placed or performed during the hospital encounter of 07/08/24 (from the past 24 hours)   BASIC METABOLIC PANEL   Result Value Ref Range    SODIUM 144 133 - 144 mmol/L    POTASSIUM 4.2 3.2 - 5.0 mmol/L    CHLORIDE 105 96 - 106 mmol/L    CO2 TOTAL 19 (L) 22 - 30 mmol/L    ANION GAP 20 (H) 7 - 18 mmol/L    CALCIUM 9.1 8.3 - 10.7 mg/dL    GLUCOSE 859 (H) 74 - 109 mg/dL    BUN 35 (H) 8 - 23 mg/dL    CREATININE 9.15 9.49 - 0.90 mg/dL    BUN/CREA RATIO 42     ESTIMATED GFR 76 (L) >90 mL/min/1.66m^2   CBC   Result Value Ref Range    WBC 9.8 3.7 - 11.0 x10^3/uL    RBC 2.24 (L) 3.85 - 5.22 x10^6/uL    HGB 6.2 (LL) 11.5 - 16.0 g/dL    HCT 78.6 (L) 65.1 - 46.0 %    MCV 95.1 78.0 - 100.0 fL    MCH 28.1 26.0 - 32.0 pg    MCHC 29.6 (L) 31.0 - 35.5 g/dL    RDW-CV 84.0 (H) 88.4 - 15.5 %    PLATELETS 45 (L) 150 - 400 x10^3/uL    MPV 9.9 8.7 - 12.5 fL   TYPE AND SCREEN   Result Value Ref Range    UNITS ORDERED 2     ABO/RH(D) O POSITIVE     ANTIBODY SCREEN NEGATIVE     SPECIMEN EXPIRATION DATE 07/15/2024,2359    CROSSMATCH RED CELLS - UNITS , 2 Units   Result Value Ref Range    Coding System [id number]     UNIT NUMBER [id number]     BLOOD COMPONENT TYPE LR RBC Adsol3, 04741     UNIT DIVISION 00     UNIT DISPENSE STATUS ALLOCATED     TRANSFUSION STATUS OK TO TRANSFUSE     IS CROSSMATCH Electronically Compatible     Product Code [id number]     Coding System [id number]     UNIT NUMBER [id number]     BLOOD COMPONENT TYPE  LR RBC, Adsol1, 04710     UNIT DIVISION 00     UNIT DISPENSE STATUS ALLOCATED     TRANSFUSION STATUS OK TO TRANSFUSE     IS CROSSMATCH Electronically Compatible     Product Code [id number]      MRI MRCP WO CONTRAST  Result Date: 07/10/2024  Impression * Mild extra hepatic bile duct dilatation as noted on CT 07/08/2024, no bile duct stone. * No gallstones, there is pericholecystic fluid as well as fluid adjacent to the liver. * Diffuse heterogeneous appearance to the liver as described above, this could certainly represent findings associated with acute hepatitis, differential considerations include regenerative nodularity in chronic liver disease, metastatic disease/lymphoma, and possibly sarcoid Radiologist location ID: [id number] Radiologist location ID: [id number]        Assessment & Plan:  Active Hospital Problems    Diagnosis    Primary Problem: Acute hypoxic respiratory failure (CMS HCC)  Acute cholecystitis    Thickening of wall of gallbladder with pericholecystic fluid    Symptomatic anemia    COVID-19    Normocytic anemia    Transaminitis    Malignant neoplasm of lung, unspecified laterality, unspecified part of lung (CMS HCC)    Pulmonary HTN (CMS HCC)    Pneumonia due to COVID-19 virus    Mood disorder (CMS HCC)    Anemia, unspecified type    Cigarette smoker    Metastatic cancer to liver    History of breast cancer    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)    Paroxysmal atrial fibrillation (CMS HCC)    Hypertension    CAD (coronary artery disease)        [name] postop day 1 from robotic cholecystectomy.  Continue pain control.  Advance diet as tolerated.  Dressing changes as needed to right lower quadrant trocar site.  Dr. Fabiene covering for the weekend.    Verneita LITTIE Pizza, APRN, CNP           [1] No Known Allergies  [2]   Social History  Tobacco Use    Smoking status: Every Day     Current packs/day: 0.50     Average packs/day: 0.5 packs/day for 52.8 years (26.4 ttl pk-yrs)      Types: Cigarettes     Start date: 1973    Smokeless tobacco: Never   Vaping Use    Vaping status: Never Used   Substance Use Topics    Alcohol use: Yes     Comment: occasional    Drug use: Yes     Types: Marijuana

## 2024-07-12 NOTE — Care Management Notes (Signed)
Attempted to meet with patient to discuss discharge planning.  Patient was sleeping at the time.  Will follow up.

## 2024-07-12 NOTE — Assessment & Plan Note (Signed)
 Pressure control  Continue losartan , metoprolol 

## 2024-07-12 NOTE — Progress Notes (Signed)
 [facility]  Progress Note    [name]  Date of service: 07/12/2024   Date of Admission:  07/08/2024    Hospital Day:  LOS: 4 days   Subjective: shortness of breath    Interval History:    [name] is a 68 y.o. female w/recently diagnosed Small-cell carcinoma w/liver mets, COPD, current smoker - not on O2 at home, CABG. She was found to have lung mass on last admission and underwent bronchoscopy w/bx of station 7 which revealed the small cell carcinoma. She came to hospital from outpatient infusion center to start chemotherapy but had dyspnea and hypoxia and was sent to ER for further evaluation. She was found to have COVID. She reports she's been feeling bad for the last few weeks. CTA chest - no PE, marginal increase in size of right infrahilar mass, right hilar and mediastinal lymphadenopathy compared to prior study. CT A/P: hepatomegaly, hepatic steatosis, and hepatic lesions not much change, new pericholecystic fluid now present. We are consulted for acute hypoxic respiratory failure, known patient.      [name] currently on 2 L NC/93%.  She is awake and alert.  Feels breathing is doing okay at present time.  Has some mild cough.  No chest pain.  No fevers noted.  No N/V/D.  No associated symptoms or modifying factors.     10/2: Patient on 3LNC/96%. Awake and alert. Feels breathing is improving. She is s/p lap chole. No chest pain. No fevers noted. No n/v/d.  No associated symptoms or modifying factors.     10/3: Patient on room air/97%. Denies shortness of breath. Not much cough. No chest pain. Having bowel movements. No obvious signs of bleeding. Hgb 6.2 this morning, getting blood transfusion.  No associated symptoms or modifying factors.     Vital Signs:  Temp  Avg: 36.6 C (97.9 F)  Min: 36.5 C (97.7 F)  Max: 36.7 C (98.1 F)    Pulse  Avg: 83.4  Min: 77  Max: 88 BP  Min: 93/63  Max: 119/76   Resp  Avg: 17  Min: 16  Max: 18 SpO2  Avg: 97.5 %  Min: 95 %  Max: 99 %           Input/Output    Intake/Output Summary (Last 24 hours) at 07/12/2024 1031  Last data filed at 07/12/2024 1022  Gross per 24 hour   Intake 231 ml   Output --   Net 231 ml    I/O last shift:  10/03 0700 - 10/03 1859  In: 50   Out: -    acetaminophen  (TYLENOL ) tablet, 650 mg, Oral, Q4H PRN  albuterol  (PROVENTIL ) 2.5 mg / 3 mL (0.083%) neb solution, 2.5 mg, Nebulization, 4x/day  aluminum-magnesium  hydroxide-simethicone (MAG-AL PLUS) 200-200-20 mg per 5 mL oral liquid, 30 mL, Oral, Q4H PRN  anastrozole  (ARIMIDEX ) tablet, 1 mg, Oral, Daily  budesonide (PULMICORT RESPULES) 0.5 mg/2 mL nebulizer suspension, 1 mg, Nebulization, 2x/day   And  arformoterol (BROVANA) 15 mcg/2 mL nebulizer solution, 15 mcg, Nebulization, 2x/day  aspirin  chewable tablet 81 mg, 81 mg, Oral, Daily  atorvastatin  (LIPITOR) tablet, 40 mg, Oral, Daily  benzonatate (TESSALON) capsule, 100 mg, Oral, Q8H PRN  cefTRIAXone (ROCEPHIN) 2 g in NS 50 mL IVPB with adaptor, 2 g, Intravenous, Q24H  cholecalciferol  (VITAMIN D3) 1000 unit (25 mcg) tablet, 1,000 Units, Oral, Daily  clonazePAM  (klonoPIN ) tablet, 0.5 mg, Oral, 2x/day PRN  NS 250 mL flush bag, , Intravenous,  Q15 Min PRN   And  D5W 250 mL flush bag, , Intravenous, Q15 Min PRN  dexAMETHasone  (DECADRON ) tablet 6 mg, 6 mg, Oral, Daily  diphenhydrAMINE (BENADRYL) capsule, 25 mg, Oral, HS PRN  DULoxetine  (CYMBALTA ) delayed release capsule, 60 mg, Oral, Daily  [Held by provider] enoxaparin PF (LOVENOX) 40 mg/0.4 mL SubQ injection, 40 mg, Subcutaneous, Q24H  fluticasone  (FLONASE ) 50 mcg per spray nasal spray, 1 Spray, Each Nostril, Daily PRN  folic acid  (FOLVITE ) tablet, 1 mg, Oral, Daily  gabapentin  (NEURONTIN ) capsule, 400 mg, Oral, 3x/day  HYDROcodone -acetaminophen  (NORCO) 5-325 mg per tablet, 1 Tablet, Oral, Q6H PRN  losartan  (COZAAR ) tablet, 25 mg, Oral, Daily  magnesium  hydroxide (MILK OF MAGNESIA) 400mg  per 5mL oral liquid, 15 mL, Oral, 4x/day PRN  metoprolol  tartrate (LOPRESSOR ) tablet, 25 mg, Oral,  2x/day  multivitamin-minerals-iron  oral liquid, 15 mL, Oral, Daily  NS flush syringe, 3 mL, Intracatheter, Q8HRS  NS flush syringe, 3 mL, Intracatheter, Q1H PRN  OLANZapine  (zyPREXA ) tablet, 5 mg, Oral, NIGHTLY  omega-3 fatty acids (LOVAZA ) capsule, 1 g, Oral, Daily  ondansetron  (ZOFRAN ) 2 mg/mL injection, 4 mg, Intravenous, Q6H PRN  pantoprazole  (PROTONIX ) delayed release tablet, 40 mg, Oral, Daily  phytonadione (vitamin K1) 10 mg in NS 50 mL IVPB, 10 mg, Intravenous, Once  polyethylene glycol (MIRALAX) oral packet, 17 g, Oral, Daily  prochlorperazine  (COMPAZINE ) tablet, 10 mg, Oral, 3x/day PRN  traZODone  (DESYREL ) tablet, 150 mg, Oral, NIGHTLY        Physical Exam:    General: Alert and oriented, no acute distress  Eye: PERRL, EOMI, normal conjunctiva  HENT: Normocephalic, normal hearing, moist oral mucosa, sinus tenderness  Neck: Supple, non-tender, no JVD, no lymphadenopathy  Lungs: Clear/diminished, non-labored respiration  Heart: Normal rate, regular rhythm, no murmur, gallop, or edema  Abdomen: soft, non-tender, non-distended, normal bowel sounds, no masses  Musculoskeletal:  Normal range of motion and strength, no tenderness or swelling  Peripheral pulses:  2+ radial (L) and 2+ radial (R)  Skin: warm, dry, pink, and intact, no rashes or lesions  Neurological:  Awake, alert, and oriented x3, MAE, CNII-XII intact  Psychiatric:  Cooperative, appropriate mood and affect    Respiratory: 3LNC    Labs:     Results for orders placed or performed during the hospital encounter of 07/08/24 (from the past 24 hours)   BASIC METABOLIC PANEL   Result Value Ref Range    SODIUM 144 133 - 144 mmol/L    POTASSIUM 4.2 3.2 - 5.0 mmol/L    CHLORIDE 105 96 - 106 mmol/L    CO2 TOTAL 19 (L) 22 - 30 mmol/L    ANION GAP 20 (H) 7 - 18 mmol/L    CALCIUM 9.1 8.3 - 10.7 mg/dL    GLUCOSE 859 (H) 74 - 109 mg/dL    BUN 35 (H) 8 - 23 mg/dL    CREATININE 9.15 9.49 - 0.90 mg/dL    BUN/CREA RATIO 42     ESTIMATED GFR 76 (L) >90 mL/min/1.70m^2    CBC   Result Value Ref Range    WBC 9.8 3.7 - 11.0 x10^3/uL    RBC 2.24 (L) 3.85 - 5.22 x10^6/uL    HGB 6.2 (LL) 11.5 - 16.0 g/dL    HCT 78.6 (L) 65.1 - 46.0 %    MCV 95.1 78.0 - 100.0 fL    MCH 28.1 26.0 - 32.0 pg    MCHC 29.6 (L) 31.0 - 35.5 g/dL    RDW-CV 84.0 (H) 88.4 - 15.5 %  PLATELETS 45 (L) 150 - 400 x10^3/uL    MPV 9.9 8.7 - 12.5 fL      Radiology:          Assessment & Plan:  Active Hospital Problems    Diagnosis    Primary Problem: Acute hypoxic respiratory failure (CMS HCC)    Acute cholecystitis    Thickening of wall of gallbladder with pericholecystic fluid    Symptomatic anemia    COVID-19    Normocytic anemia    Transaminitis    Malignant neoplasm of lung, unspecified laterality, unspecified part of lung (CMS HCC)    Pulmonary HTN (CMS HCC)    Pneumonia due to COVID-19 virus    Mood disorder (CMS HCC)    Anemia, unspecified type    Cigarette smoker    Metastatic cancer to liver    History of breast cancer    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)    Paroxysmal atrial fibrillation (CMS HCC)    Hypertension    CAD (coronary artery disease)      Patient seen and examined. Reviewed pertinent labs, imaging, and notes.  68 y/o female recently dx SCLC w/liver mets, COPD not on O2 at home, current smoker, was out outpatient infusion center to start chemo and came in for dyspnea and hypoxia, found to have COVID  CTA chest - no PE, marginal increase in size of right infrahilar mass, right hilar and mediastinal lymphadenopathy compared to prior study. CT A/P: hepatomegaly, hepatic steatosis, and hepatic lesions not much change, new pericholecystic fluid now present.   Wean oxygen as patient tolerates for SpO2 >/=92%, on room air   Continue steroids  Agree with empiric antibiotics-ceftriaxone  Urine antigens negative  U/S showed acute cholecystitis with pericholecystic fluid and wall thickening  She is now s/p lap chole on 10/2  Continue Brovana/Pulmicort   Sputum culture grew light  growth yeast   Hgb 6.2 this morning, getting x2 units blood; no overt signs of bleeding  Fecal occult ordered   Continue nebs  This patient was seen and examined with my attending and he will add any further recommendations.      Merlin KANDICE Overland, APRN, CNP    _______________________________    I personally saw and evaluated the patient as part of a shared service with an APP.    My substantive findings are:  MDM (complete) I have personally managed 1 acute illness with systemic symptoms.  (acute respiratory failure due to COVID complicated by acute cholecystitis requiring surgery.)  I have reviewed and actively participated in management of patient's prescription medications.  Continue albuterol , Brovana, budesonide nebs.  Complete Decadron  for COVID.  Increase activity as able to.  Hemoglobin down today.  Monitor abdomen and signs of bleeding.  Transfuse as needed.        Elsie Marsa Gully, MD  07/12/2024 14:14

## 2024-07-12 NOTE — Nurses Notes (Signed)
 Dr.Lucente notified of pt lap site bleeding through the dressing freq. Pt Hgb 6.2 this morning. New orders to give Vit K IV and 2 unites of PRBC. He also stated that he wasn't going to do any other interventions to lap site and to keep applying pressure to the area. Pt notified of new orders.  Ileana Sides, RN

## 2024-07-12 NOTE — Assessment & Plan Note (Signed)
 Rate controlled, continue metoprolol , not on anticoagulation

## 2024-07-13 ENCOUNTER — Inpatient Hospital Stay (HOSPITAL_COMMUNITY)

## 2024-07-13 DIAGNOSIS — D62 Acute posthemorrhagic anemia: Secondary | ICD-10-CM

## 2024-07-13 DIAGNOSIS — I4729 Other ventricular tachycardia: Secondary | ICD-10-CM | POA: Insufficient documentation

## 2024-07-13 DIAGNOSIS — I509 Heart failure, unspecified: Secondary | ICD-10-CM

## 2024-07-13 DIAGNOSIS — I10 Essential (primary) hypertension: Secondary | ICD-10-CM

## 2024-07-13 DIAGNOSIS — I472 Ventricular tachycardia, unspecified: Secondary | ICD-10-CM

## 2024-07-13 DIAGNOSIS — K661 Hemoperitoneum: Secondary | ICD-10-CM

## 2024-07-13 DIAGNOSIS — I480 Paroxysmal atrial fibrillation: Secondary | ICD-10-CM

## 2024-07-13 LAB — COMPREHENSIVE METABOLIC PANEL, NON-FASTING
ALBUMIN: 2.9 g/dL — ABNORMAL LOW (ref 3.5–5.2)
ALBUMIN: 3.1 g/dL — ABNORMAL LOW (ref 3.5–5.2)
ALKALINE PHOSPHATASE: 378 U/L — ABNORMAL HIGH (ref 35–129)
ALKALINE PHOSPHATASE: 385 U/L — ABNORMAL HIGH (ref 35–129)
ALT (SGPT): 899 U/L — ABNORMAL HIGH (ref 0–33)
ALT (SGPT): 792 U/L — ABNORMAL HIGH (ref 0–33)
ANION GAP: 17 mmol/L (ref 7–18)
ANION GAP: 18 mmol/L (ref 7–18)
AST (SGOT): 1898 U/L — ABNORMAL HIGH (ref 0–32)
AST (SGOT): 1527 U/L — ABNORMAL HIGH (ref 0–32)
BILIRUBIN TOTAL: 1.6 mg/dL — ABNORMAL HIGH (ref 0.2–1.2)
BILIRUBIN TOTAL: 1.8 mg/dL — ABNORMAL HIGH (ref 0.2–1.2)
BUN: 50 mg/dL — ABNORMAL HIGH (ref 8–23)
BUN: 49 mg/dL — ABNORMAL HIGH (ref 8–23)
CALCIUM: 9 mg/dL (ref 8.3–10.7)
CALCIUM: 9 mg/dL (ref 8.3–10.7)
CHLORIDE: 104 mmol/L (ref 96–106)
CHLORIDE: 104 mmol/L (ref 96–106)
CO2 TOTAL: 21 mmol/L — ABNORMAL LOW (ref 22–30)
CO2 TOTAL: 23 mmol/L (ref 22–30)
CREATININE: 1.25 mg/dL — ABNORMAL HIGH (ref 0.50–0.90)
CREATININE: 1.1 mg/dL — ABNORMAL HIGH (ref 0.50–0.90)
ESTIMATED GFR: 47 mL/min/1.73mˆ2 — ABNORMAL LOW (ref >90)
ESTIMATED GFR: 55 mL/min/1.73mˆ2 — ABNORMAL LOW (ref >90)
GLUCOSE: 134 mg/dL — ABNORMAL HIGH (ref 74–109)
GLUCOSE: 149 mg/dL — ABNORMAL HIGH (ref 74–109)
POTASSIUM: 3.6 mmol/L (ref 3.2–5.0)
POTASSIUM: 3.4 mmol/L (ref 3.2–5.0)
PROTEIN TOTAL: 5.7 g/dL — ABNORMAL LOW (ref 6.4–8.3)
PROTEIN TOTAL: 5.8 g/dL — ABNORMAL LOW (ref 6.4–8.3)
SODIUM: 142 mmol/L (ref 133–144)
SODIUM: 145 mmol/L — ABNORMAL HIGH (ref 133–144)

## 2024-07-13 LAB — CBC WITH DIFF
HCT: 25.8 % — ABNORMAL LOW (ref 34.8–46.0)
HCT: 24.3 % — ABNORMAL LOW (ref 34.8–46.0)
HGB: 8.1 g/dL — ABNORMAL LOW (ref 11.5–16.0)
HGB: 7.7 g/dL — ABNORMAL LOW (ref 11.5–16.0)
MCH: 27.7 pg (ref 26.0–32.0)
MCH: 27.1 pg (ref 26.0–32.0)
MCHC: 31.4 g/dL (ref 31.0–35.5)
MCHC: 31.7 g/dL (ref 31.0–35.5)
MCV: 88.4 fL (ref 78.0–100.0)
MCV: 85.6 fL (ref 78.0–100.0)
MPV: 11.2 fL (ref 8.7–12.5)
MPV: 10.4 fL (ref 8.7–12.5)
PLATELETS: 40 x10ˆ3/uL — ABNORMAL LOW (ref 150–400)
PLATELETS: 56 x10ˆ3/uL — ABNORMAL LOW (ref 150–400)
RBC: 2.92 x10ˆ6/uL — ABNORMAL LOW (ref 3.85–5.22)
RBC: 2.84 x10ˆ6/uL — ABNORMAL LOW (ref 3.85–5.22)
RDW-CV: 15.7 % — ABNORMAL HIGH (ref 11.5–15.5)
RDW-CV: 15.8 % — ABNORMAL HIGH (ref 11.5–15.5)
WBC: 7.6 x10ˆ3/uL (ref 3.7–11.0)
WBC: 8.2 x10ˆ3/uL (ref 3.7–11.0)

## 2024-07-13 LAB — MANUAL DIFF AND MORPHOLOGY-SYSMEX
BASOPHIL #: <0.1 x10ˆ3/uL (ref <=0.20)
BASOPHIL #: <0.1 x10ˆ3/uL (ref <=0.20)
BASOPHIL %: 0 %
BASOPHIL %: 0 %
EOSINOPHIL #: <0.1 x10ˆ3/uL (ref <=0.50)
EOSINOPHIL #: <0.1 x10ˆ3/uL (ref <=0.50)
EOSINOPHIL %: 0 %
EOSINOPHIL %: 1 %
LYMPHOCYTE #: 1.98 x10ˆ3/uL (ref 1.00–4.80)
LYMPHOCYTE #: 1.64 x10ˆ3/uL (ref 1.00–4.80)
LYMPHOCYTE %: 25 %
LYMPHOCYTE %: 20 %
METAMYELOCYTE %: 2 %
MONOCYTE #: <0.1 x10ˆ3/uL — ABNORMAL LOW (ref 0.20–1.10)
MONOCYTE #: 0.16 x10ˆ3/uL — ABNORMAL LOW (ref 0.20–1.10)
MONOCYTE %: 1 %
MONOCYTE %: 2 %
NEUTROPHIL #: 5.47 x10ˆ3/uL (ref 1.50–7.70)
NEUTROPHIL #: 6.15 x10ˆ3/uL (ref 1.50–7.70)
NEUTROPHIL %: 66 %
NEUTROPHIL %: 73 %
NEUTROPHIL BANDS %: 6 %
NEUTROPHIL BANDS %: 2 %
NRBC FROM MANUAL DIFF: 17 /100{WBCs} — ABNORMAL HIGH (ref <=1)
NRBC FROM MANUAL DIFF: 15 /100{WBCs} — ABNORMAL HIGH (ref <=1)
PROMYELOCYTE % MANUAL: 1 % — ABNORMAL HIGH (ref <=0)
REACTIVE LYMPHOCYTE %: 1 %

## 2024-07-13 LAB — PT/INR
INR: 1.29 — ABNORMAL HIGH (ref 0.86–1.14)
INR: 1.33 — ABNORMAL HIGH (ref 0.86–1.14)
PROTHROMBIN TIME: 16.4 s — ABNORMAL HIGH (ref 12.1–15.3)
PROTHROMBIN TIME: 16.8 s — ABNORMAL HIGH (ref 12.1–15.3)

## 2024-07-13 LAB — ECG 12 LEAD
Atrial Rate: 99 {beats}/min
Calculated P Axis: 68 degrees
Calculated R Axis: 58 degrees
Calculated T Axis: 62 degrees
PR Interval: 148 ms
QRS Duration: 90 ms
QT Interval: 370 ms
QTC Calculation: 474 ms
Ventricular rate: 99 {beats}/min

## 2024-07-13 LAB — RESPIRATORY CULTURE AND GRAM STAIN (PERFORMABLE)

## 2024-07-13 LAB — LACTIC ACID - SECOND REFLEX: LACTIC ACID: 3.8 mmol/L — ABNORMAL HIGH (ref 0.5–2.0)

## 2024-07-13 LAB — PTT (PARTIAL THROMBOPLASTIN TIME): APTT: 20.4 s — ABNORMAL LOW (ref 22.4–34.8)

## 2024-07-13 LAB — MAGNESIUM
MAGNESIUM: 2.3 mg/dL (ref 1.6–2.4)
MAGNESIUM: 2 mg/dL (ref 1.6–2.4)

## 2024-07-13 LAB — H & H
HCT: 22.6 % — ABNORMAL LOW (ref 34.8–46.0)
HGB: 7.2 g/dL — ABNORMAL LOW (ref 11.5–16.0)

## 2024-07-13 LAB — LACTIC ACID - FIRST REFLEX: LACTIC ACID: 4.4 mmol/L (ref 0.5–2.0)

## 2024-07-13 LAB — LACTIC ACID LEVEL W/ REFLEX FOR LEVEL >2.0: LACTIC ACID: 2.8 mmol/L — ABNORMAL HIGH (ref 0.5–2.0)

## 2024-07-13 MED ORDER — AMIODARONE 360 MG/200 ML (1.8 MG/ML) IN DEXTROSE, ISO-OSMOTIC IV
0.5000 mg/min | INTRAVENOUS | Status: AC
Start: 2024-07-13 — End: 2024-07-14
  Administered 2024-07-13 (×2): 0.5 mg/min via INTRAVENOUS
  Administered 2024-07-14: 0 mg/min via INTRAVENOUS
  Filled 2024-07-13 (×2): qty 200

## 2024-07-13 MED ORDER — AMIODARONE 150 MG/100 ML (1.5 MG/ML) IN DEXTROSE, ISO-OSMOTIC IV
150.0000 mg | Freq: Once | INTRAVENOUS | Status: AC
Start: 2024-07-13 — End: 2024-07-13
  Administered 2024-07-13: 150 mg via INTRAVENOUS
  Administered 2024-07-13: 0 mg via INTRAVENOUS
  Filled 2024-07-13: qty 100

## 2024-07-13 MED ORDER — AMIODARONE 360 MG/200 ML (1.8 MG/ML) IN DEXTROSE, ISO-OSMOTIC IV
1.0000 mg/min | INTRAVENOUS | Status: AC
Start: 2024-07-13 — End: 2024-07-13
  Administered 2024-07-13: 0 mg/min via INTRAVENOUS
  Administered 2024-07-13: 1 mg/min via INTRAVENOUS
  Filled 2024-07-13 (×2): qty 200

## 2024-07-13 MED ORDER — LACTATED RINGERS IV BOLUS
1000.0000 mL | INJECTION | Freq: Once | Status: AC
Start: 2024-07-13 — End: 2024-07-13
  Administered 2024-07-13: 1000 mL via INTRAVENOUS
  Administered 2024-07-13: 0 mL via INTRAVENOUS

## 2024-07-13 MED ORDER — AMIODARONE 360 MG/200 ML (1.8 MG/ML) IN DEXTROSE, ISO-OSMOTIC IV
0.5000 mg/min | INTRAVENOUS | Status: DC
Start: 2024-07-13 — End: 2024-07-13
  Filled 2024-07-13: qty 200

## 2024-07-13 NOTE — Progress Notes (Signed)
 [facility] Coverage Note          Name:  [name] MRN:  [id number]   Admission Date: 07/08/2024   DOB:  13-Jun-1956 (68 y.o.)   Date of service: 07/13/2024       Received notification from nursing staff that they were notified from the telemetry monitor tech that patient had a run of nonsustained V-tach.  When they looked at the patient's telemetry monitoring, at the time of notification, patient was running sinus rhythm with PVCs with a heart rate in the 90s.  Advised them to just monitor for now, but notify me if it happens again.    Overnight Coverage Note:  Otherwise no change in plan of care.  Continue current medical management and supportive care.  Will defer further management decisions to primary team in the morning.    Warren Edsel Riggs, APRN, CNP  07/13/2024, 01:31

## 2024-07-13 NOTE — Progress Notes (Signed)
 [facility]        Name: [name] MRN:  [id number]   Date of Birth: 02-Jun-1956 Age: 68 y.o.   Date: 07/13/2024       Provider: Josette SHAUNNA Doffing, MD   PCP: Garvin Lemmings, FNP    Objective:  [name] is a 68 y.o. female postop day 2 robotic cholecystectomy, she did have a drop in her hemoglobin last night was transfused 2 units of blood, she remained hemodynamically stable, hemoglobin is slightly down today.  Her abdomen appeared more distended she was sent for CT scan of her abdomen pelvis which does not show an increasing hemoperitoneum.  Patient states she feels like she needs to urinate.        Objective:    Physical Exam:   Vital Signs:  Temperature: 37.2 C (99 F)  Heart Rate: 96  BP (Non-Invasive): 118/67  Respiratory Rate: 16  SpO2: 94 %      Physical Exam:   Resting in bed no acute distress  Abdomen is soft but distended and appropriately tender around the incisions.  When incision has a small superficial hematoma that has draining but overall normal postoperative exam.             Assessment:  Problem List[1]      Plan:      [name] doing relatively well, I do not see any indication of intra-abdominal bleeding on the repeat CT.  Recommend continue following serial hemoglobin and coags and follow clinically.      Monica Doffing, MD, FACS    Portions of this note may be dictated using voice recognition software or a dictation service. Variances in spelling and vocabulary are possible and unintentional. Not all errors are caught/corrected. Please notify the dino if any discrepancies are noted or if the meaning of any statement is not clear.        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)    History of breast cancer    Simple chronic bronchitis (CMS HCC)    Metastatic cancer to liver    Malaise    Feeling unwell    RUQ pain    Lung mass    Mediastinal adenopathy    Cigarette smoker    Acute hypoxic respiratory failure (CMS HCC)    COVID-19    Acute blood loss anemia    Transaminitis    Malignant neoplasm of lung, unspecified laterality, unspecified part of lung (CMS HCC)    Pulmonary HTN (CMS HCC)    Pneumonia due to COVID-19 virus    Mood disorder (CMS HCC)    Anemia, unspecified type    Thickening of wall of gallbladder with pericholecystic fluid    Symptomatic anemia    Acute cholecystitis    Ventricular tachycardia (CMS HCC)

## 2024-07-13 NOTE — Consults (Signed)
 [facility]  Hematology/Oncology    Consult progress Note      [name], [name], 68 y.o. female  Date of Birth:  Apr 15, 1956  Inpatient Admission Date:  07/08/2024  Date of Service: 07/13/2024    Chief Complaint:  Known patient - history of small-cell lung CA with liver metastases    Interim HPI:  Resting in bed.  Family at bedside.  No acute events reported overnight.  Does endorse some abdominal discomfort.  CT abdomen/pelvis showing hemoperitoneum.  General surgery without further intervention at this time.  S/p cholecystectomy on 07/11/2024.  S/p recent PRBC transfusions.    HPI:  This is a 68 year old female who presented outpatient infusion center today for Tecentriq/etoposide/carbo treatment when she reported worsening dyspnea to nursing staff.  She was found to be 89% on room air.  Was then placed on 2 L nasal cannula O2 which increased her to 94% oxygenation.  Also c/o worsening generalized pain that is worse in the legs as well as productive cough with yellow sputum.  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    Review of Systems:   Review of Systems   Constitutional:  Positive for fatigue. Negative for activity change, appetite change, chills, diaphoresis, fever and unexpected weight change.   HENT:  Negative for congestion, ear pain, facial swelling, hearing loss, mouth sores, nosebleeds, sinus pain, sore throat, tinnitus, trouble swallowing and voice change.    Eyes:  Negative for photophobia, pain and visual disturbance.   Respiratory:  Positive for cough and shortness of breath. Negative for apnea, choking, chest tightness, wheezing and stridor.    Cardiovascular:  Negative for chest pain, palpitations and leg swelling.   Gastrointestinal:  Negative for abdominal distention, abdominal pain, anal bleeding, blood in stool, constipation, diarrhea, nausea, rectal pain and vomiting.   Endocrine: Negative for cold  intolerance and heat intolerance.   Genitourinary:  Negative for decreased urine volume, difficulty urinating, dysuria, flank pain, frequency, hematuria and urgency.   Musculoskeletal:  Positive for arthralgias and myalgias. Negative for back pain, gait problem, joint swelling and neck pain.   Skin:  Negative for color change, pallor, rash and wound.   Allergic/Immunologic: Negative for immunocompromised state.   Neurological:  Negative for dizziness, syncope, facial asymmetry, speech difficulty, weakness, light-headedness, numbness and headaches.   Hematological:  Negative for adenopathy. Does not bruise/bleed easily.   Psychiatric/Behavioral:  Negative for agitation, behavioral problems and confusion.      Past Medical History:   Diagnosis Date    Abdominal pain     Cancer (CMS HCC)     Chest pain     Chronic lung disease     Coronary artery disease     Dyslipidemia     Essential hypertension     Heart disease     History of percutaneous coronary intervention     History of stress test     Mixed hyperlipidemia     Unspecified chronic bronchitis      Past Surgical History:   Procedure Laterality Date    CARDIAC CATHETERIZATION      CORONARY ANGIOPLASTY      CORONARY ARTERY STENT PLACEMENT      HX BACK SURGERY      HX CORONARY ARTERY BYPASS GRAFT      HX MASTECTOMY, SIMPLE Bilateral     HX TUBAL LIGATION      NECK SURGERY  Social History     Socioeconomic History    Marital status: Widowed     Spouse name: Not on file    Number of children: Not on file    Years of education: Not on file    Highest education level: Not on file   Occupational History    Not on file   Tobacco Use    Smoking status: Every Day     Current packs/day: 0.50     Average packs/day: 0.5 packs/day for 52.8 years (26.4 ttl pk-yrs)     Types: Cigarettes     Start date: 63    Smokeless tobacco: Never   Vaping Use    Vaping status: Never Used   Substance and Sexual Activity    Alcohol use: Yes     Comment: occasional    Drug use: Yes      Types: Marijuana    Sexual activity: Not on file   Other Topics Concern    Not on file   Social History Narrative    Not on file     Social Determinants of Health     Financial Resource Strain: Not on file   Transportation Needs: Not on file   Social Connections: Low Risk (07/09/2024)    Social Connections     SDOH Social Isolation: 5 or more times a week   Intimate Partner Violence: Not on file   Housing Stability: Not on file      Family Medical History:       Problem Relation (Age of Onset)    Arthritis Mother, Maternal Grandmother    Cancer Father, Other    Heart Attack Father, Other    Heart Disease Brother, Other    Hypertension (High Blood Pressure) Mother, Father, Other    Pacemaker Mother          Vital Signs:  Temp  Avg: 36.6 C (97.9 F)  Min: 36.4 C (97.5 F)  Max: 37.2 C (99 F)    Pulse  Avg: 89.5  Min: 77  Max: 118 BP  Min: 96/60  Max: 125/71   Resp  Avg: 16.4  Min: 14  Max: 18 SpO2  Avg: 94.2 %  Min: 92 %  Max: 97 %          Input/Output    Intake/Output Summary (Last 24 hours) at 07/13/2024 1448  Last data filed at 07/13/2024 1340  Gross per 24 hour   Intake 3101.25 ml   Output --   Net 3101.25 ml    I/O last shift:  10/04 0700 - 10/04 1859  In: 1435.83 [I.V.:1000; Blood:335.83]  Out: -      LABS:   Results for orders placed or performed during the hospital encounter of 07/08/24 (from the past 24 hours)   CBC   Result Value Ref Range    WBC 8.4 3.7 - 11.0 x10^3/uL    RBC 3.03 (L) 3.85 - 5.22 x10^6/uL    HGB 8.5 (L) 11.5 - 16.0 g/dL    HCT 73.5 (L) 65.1 - 46.0 %    MCV 87.1 78.0 - 100.0 fL    MCH 28.1 26.0 - 32.0 pg    MCHC 32.2 31.0 - 35.5 g/dL    RDW-CV 84.4 88.4 - 84.4 %    PLATELETS 41 (L) 150 - 400 x10^3/uL    MPV 10.7 8.7 - 12.5 fL   PRODUCT: FFP/PLASMA - UNITS : 2 Units   Result Value Ref Range    Coding System  [id number]     UNIT NUMBER [id number]     BLOOD COMPONENT TYPE THAWED PLASMA     UNIT DIVISION 00     UNIT DISPENSE STATUS ISSUED     TRANSFUSION STATUS OK TO TRANSFUSE     Product Code  [id number]     Coding System [id number]     UNIT NUMBER [id number]     BLOOD COMPONENT TYPE THAWED PLASMA     UNIT DIVISION 00     UNIT DISPENSE STATUS ISSUED,FINAL     TRANSFUSION STATUS OK TO TRANSFUSE     Product Code [id number]    PRODUCT: PLATELETS - UNITS , 2 Units   Result Value Ref Range    Coding System [id number]     UNIT NUMBER [id number]     BLOOD COMPONENT TYPE PATHOGEN REDUCED PLASMA REDUCED1     UNIT DIVISION 00     UNIT DISPENSE STATUS ISSUED     TRANSFUSION STATUS OK TO TRANSFUSE     Product Code [id number]    PT/INR   Result Value Ref Range    PROTHROMBIN TIME 16.4 (H) 12.1 - 15.3 seconds    INR 1.29 (H) 0.86 - 1.14   PTT (PARTIAL THROMBOPLASTIN TIME) - AM ONCE   Result Value Ref Range    APTT 20.4 (L) 22.4 - 34.8 seconds   COMPREHENSIVE METABOLIC PANEL, NON-FASTING   Result Value Ref Range    SODIUM 142 133 - 144 mmol/L    POTASSIUM 3.6 3.2 - 5.0 mmol/L    CHLORIDE 104 96 - 106 mmol/L    CO2 TOTAL 21 (L) 22 - 30 mmol/L    ANION GAP 17 7 - 18 mmol/L    BUN 50 (H) 8 - 23 mg/dL    CREATININE 8.74 (H) 0.50 - 0.90 mg/dL    ESTIMATED GFR 47 (L) >90 mL/min/1.24m^2    ALBUMIN 2.9 (L) 3.5 - 5.2 g/dL    CALCIUM 9.0 8.3 - 89.2 mg/dL    GLUCOSE 865 (H) 74 - 109 mg/dL    ALKALINE PHOSPHATASE 378 (H) 35 - 129 U/L    ALT (SGPT) 899 (H) 0 - 33 U/L    AST (SGOT) 1,898 (H) 0 - 32 U/L    BILIRUBIN TOTAL 1.6 (H) 0.2 - 1.2 mg/dL    PROTEIN TOTAL 5.7 (L) 6.4 - 8.3 g/dL   MAGNESIUM    Result Value Ref Range    MAGNESIUM  2.3 1.6 - 2.4 mg/dL   CBC WITH DIFF   Result Value Ref Range    WBC 7.6 3.7 - 11.0 x10^3/uL    RBC 2.92 (L) 3.85 - 5.22 x10^6/uL    HGB 8.1 (L) 11.5 - 16.0 g/dL    HCT 74.1 (L) 65.1 - 46.0 %    MCV 88.4 78.0 - 100.0 fL    MCH 27.7 26.0 - 32.0 pg    MCHC 31.4 31.0 - 35.5 g/dL    RDW-CV 84.2 (H) 88.4 - 15.5 %    PLATELETS 40 (L) 150 - 400 x10^3/uL    MPV 11.2 8.7 - 12.5 fL   MANUAL DIFF AND MORPHOLOGY-SYSMEX   Result Value Ref Range    NEUTROPHIL % 66 %    LYMPHOCYTE %  25 %    MONOCYTE % 1 %    EOSINOPHIL % 0 %     BASOPHIL % 0 %    NEUTROPHIL BANDS % 6   %    PROMYELOCYTE % MANUAL 1 (H) <=0 %    REACTIVE LYMPHOCYTE %  1 %    NEUTROPHIL # 5.47 1.50 - 7.70 x10^3/uL    LYMPHOCYTE # 1.98 1.00 - 4.80 x10^3/uL    MONOCYTE # <0.10 (L) 0.20 - 1.10 x10^3/uL    EOSINOPHIL # <0.10 <=0.50 x10^3/uL    BASOPHIL # <0.10 <=0.20 x10^3/uL    NRBC FROM MANUAL DIFF 17 (H) <=1 per 100 WBC    ACANTHOCYTES (SPUR CELL) 2+/Moderate (A) None    ANISOCYTOSIS 2+/Moderate (A) None   ECG 12 LEAD   Result Value Ref Range    Ventricular rate 99 BPM    Atrial Rate 99 BPM    PR Interval 148 ms    QRS Duration 90 ms    QT Interval 370 ms    QTC Calculation 474 ms    Calculated P Axis 68 degrees    Calculated R Axis 58 degrees    Calculated T Axis 62 degrees   PT/INR   Result Value Ref Range    PROTHROMBIN TIME 16.8 (H) 12.1 - 15.3 seconds    INR 1.33 (H) 0.86 - 1.14   LACTIC ACID LEVEL W/ REFLEX FOR LEVEL >2.0   Result Value Ref Range    LACTIC ACID 2.8 (H) 0.5 - 2.0 mmol/L   CBC WITH DIFF   Result Value Ref Range    WBC 8.2 3.7 - 11.0 x10^3/uL    RBC 2.84 (L) 3.85 - 5.22 x10^6/uL    HGB 7.7 (L) 11.5 - 16.0 g/dL    HCT 75.6 (L) 65.1 - 46.0 %    MCV 85.6 78.0 - 100.0 fL    MCH 27.1 26.0 - 32.0 pg    MCHC 31.7 31.0 - 35.5 g/dL    RDW-CV 84.1 (H) 88.4 - 15.5 %    PLATELETS 56 (L) 150 - 400 x10^3/uL    MPV 10.4 8.7 - 12.5 fL   MANUAL DIFF AND MORPHOLOGY-SYSMEX   Result Value Ref Range    NEUTROPHIL % 73 %    LYMPHOCYTE %  20 %    MONOCYTE % 2 %    EOSINOPHIL % 1 %    BASOPHIL % 0 %    NEUTROPHIL BANDS % 2   %    METAMYELOCYTE %  2   %    NEUTROPHIL # 6.15 1.50 - 7.70 x10^3/uL    LYMPHOCYTE # 1.64 1.00 - 4.80 x10^3/uL    MONOCYTE # 0.16 (L) 0.20 - 1.10 x10^3/uL    EOSINOPHIL # <0.10 <=0.50 x10^3/uL    BASOPHIL # <0.10 <=0.20 x10^3/uL    NRBC FROM MANUAL DIFF 15 (H) <=1 per 100 WBC   COMPREHENSIVE METABOLIC PANEL, NON-FASTING   Result Value Ref Range    SODIUM 145 (H) 133 - 144 mmol/L    POTASSIUM 3.4 3.2 - 5.0 mmol/L    CHLORIDE 104 96 - 106 mmol/L    CO2 TOTAL  23 22 - 30 mmol/L    ANION GAP 18 7 - 18 mmol/L    BUN 49 (H) 8 - 23 mg/dL    CREATININE 8.89 (H) 0.50 - 0.90 mg/dL    ESTIMATED GFR 55 (L) >90 mL/min/1.30m^2    ALBUMIN 3.1 (L) 3.5 - 5.2 g/dL    CALCIUM 9.0 8.3 - 89.2 mg/dL    GLUCOSE 850 (H) 74 - 109 mg/dL    ALKALINE PHOSPHATASE 385 (H) 35 - 129 U/L    ALT (SGPT) 792 (H) 0 - 33 U/L    AST (SGOT) 1,527 (H) 0 - 32 U/L  BILIRUBIN TOTAL 1.8 (H) 0.2 - 1.2 mg/dL    PROTEIN TOTAL 5.8 (L) 6.4 - 8.3 g/dL   MAGNESIUM  - ONCE   Result Value Ref Range    MAGNESIUM  2.0 1.6 - 2.4 mg/dL   LACTIC ACID - FIRST REFLEX   Result Value Ref Range    LACTIC ACID 4.4 (HH) 0.5 - 2.0 mmol/L   LACTIC ACID - SECOND REFLEX   Result Value Ref Range    LACTIC ACID 3.8 (H) 0.5 - 2.0 mmol/L     IMAGES:  Results for orders placed or performed during the hospital encounter of 07/08/24 (from the past 72 hours)   CT ABDOMEN PELVIS WO IV CONTRAST     Status: None    Narrative    Juanette JEAN Solana    CT ABDOMEN PELVIS WO IV CONTRAST performed on 07/12/2024 5:00 PM.    INDICATION:  bleeding from laparoscopy incision, significant drop in hemoglobin and hypotensive, to rule out intraperitoneal/retroperitoneal bleeding   Additional History:  bleeding at incision site and abnormal labs; hx of tubal    TECHNIQUE:  Unenhanced CT abdomen and pelvis with axial, coronal, and sagittal multiplanar reformations. Dose modulation, automated exposure control, and/or iterative reconstruction were used for dose reduction.    COMPARISON:  Contrast-enhanced CT dated 07/08/2024  ___________________________________  FINDINGS:    LOWER THORAX: Sternal wires. No lung base infiltrates. Normal heart size. Calcified coronary arteries. Small sliding hiatal hernia.    Chronic and degenerative bony changes. Scoliosis.    Evaluation of abdominal viscera is suboptimal due to noncontrast technique.    HEPATOBILIARY:      Hepatomegaly. Patient reportedly has metastatic liver disease which is not well seen on noncontrast CT.      Reportedly interval cholecystectomy. No surgical clips identified however. Clinical significance uncertain.    There is mixed hyperdense and hypodense material/fluid in the gallbladder fossa. This is thought to represent hematoma within the gallbladder fossa with fluid tracking inferiorly. It is primarily situated anterior to the hepatic flexure of the colon. Best demonstrated on axial series 3 image [id number]. Also sagittal series 6 image 72. Measures craniocaudal up to 10 cm on sagittal series 6 image 72.. Transaxial up to about 3.5 x 3 cm. There is some fluid superior to the left hepatic lobe between the liver and the anterior abdominal wall and central diaphragm. This is presumably related to blood. There is moderate volume of free fluid in the pelvis. This is dependently hyperdense and compatible with hemoperitoneum. There is a small amount of fluid in the bilateral paracolic gutters. There is a small amount of perisplenic fluid. There is a small hiatal hernia with some fluid tracking into the posterior mediastinum at this level.    PANCREAS: Pancreas grossly unremarkable.  SPLEEN: No splenomegaly is seen.  ADRENALS: No adrenal nodules are seen.  KIDNEYS: Multiple bilateral intrarenal nonobstructing calculi. Small left renal probable cysts for which no further workup is needed. No hydronephrosis or asymmetric perinephric fat stranding.  LYMPH NODES: No retroperitoneal lymphadenopathy is seen.  VESSELS: Abdominal aorta is normal in caliber.  GI TRACT: Colonic diverticulosis. No obvious changes to suggest acute diverticulitis though the sigmoid colon and rectum are partially obscured by blood. No dilated small bowel loops. No bowel obstruction or ileus. Normal appendix is identified.  PELVIS:  There are tubal ligation clips. No urinary bladder distention.    There is dressing material associated with what may represent a laparoscopy port in the right upper mid abdomen. There are some  hyperdense material extending  from the rectus muscle to the skin which could represent blood. There is some gas bubbles at the level of the umbilicus, extraperitoneal which may represent postsurgical change.    No free air or bowel obstruction.  ___________________________________    Impression    1. Hematoma associated with the gallbladder fossa with fluid tracking inferiorly.     2. Moderate volume hemoperitoneum, more so blood lying in the dependent pelvis, though also around the liver and spleen and pericolic gutters.    3. Other findings as described above.      Radiologist location ID: [id number]     CT ABDOMEN PELVIS WO IV CONTRAST     Status: None    Narrative    Baneza JEAN Rosete    CT ABDOMEN PELVIS WO IV CONTRAST performed on 07/13/2024 8:53 AM.    INDICATION:  abdomen more distended and tender   Additional History:  Abdominal pain    TECHNIQUE:  Unenhanced CT abdomen and pelvis with axial, coronal, and sagittal multiplanar reformations. Dose modulation, automated exposure control, and/or iterative reconstruction were used for dose reduction.    COMPARISON:  CT of the abdomen and pelvis 07/12/2024  ___________________________________  FINDINGS:    The lower chest shows coronary artery calcifications and presumed postsurgical changes of CABG.    The liver is enlarged. Mild periportal edema is evident. A hematoma in the gallbladder fossa is grossly similar compared to the prior study with a small amount of hemoperitoneum tracking inferiorly. The hemoperitoneum itself is slightly decreased in volume. There is a small amount of perihepatic fluid similar compared to prior study. Hemoperitoneum deeper in the pelvis remains moderate slightly decreased in overall volume.    The adrenal glands and pancreas are unremarkable. The spleen is enlarged. Bilateral nephrolithiasis is evident. The bladder is distended. The uterus is grossly normal. Bilateral tubal ligation clips are evident. Colonic diverticulosis is evident. No acute abnormality of the  bowel is seen.    Atherosclerosis is seen throughout the aorta and its branches without aneurysm.    Postsurgical changes are seen throughout the anterior abdominal wall with a few small subcutaneous hematomas, grossly similar compared to the prior study. No acute skeletal abnormality is seen. ___________________________________    Impression    1. Grossly stable hematoma in the gallbladder fossa with slight decrease in volume of hemoperitoneum.  2. Additional stable findings as above.        Radiologist location ID: [id number]       Physical Exam:  Physical Exam  Vitals reviewed.   Constitutional:       Appearance: She is normal weight. She is ill-appearing.   HENT:      Head: Normocephalic.      Nose: Nose normal.      Mouth/Throat:      Pharynx: Oropharynx is clear.   Eyes:      Extraocular Movements: Extraocular movements intact.      Conjunctiva/sclera: Conjunctivae normal.   Cardiovascular:      Rate and Rhythm: Normal rate.      Pulses: Normal pulses.   Pulmonary:      Effort: Pulmonary effort is normal.   Abdominal:      Palpations: Abdomen is soft.   Musculoskeletal:         General: Normal range of motion.      Cervical back: Normal range of motion.   Skin:     General: Skin is dry.   Neurological:  General: No focal deficit present.      Mental Status: She is alert and oriented to person, place, and time. Mental status is at baseline.   Psychiatric:         Mood and Affect: Mood normal.         Behavior: Behavior normal.         Thought Content: Thought content normal.         Judgment: Judgment normal.       Problem List:  Active Hospital Problems   (*Primary Problem)    Diagnosis    *Acute hypoxic respiratory failure (CMS HCC)    Ventricular tachycardia (CMS HCC)    Heart failure, unspecified HF chronicity, unspecified heart failure type (CMS HCC)    Acute cholecystitis    Thickening of wall of gallbladder with pericholecystic fluid    Symptomatic anemia    COVID-19    Acute blood loss anemia     Transaminitis    Malignant neoplasm of lung, unspecified laterality, unspecified part of lung (CMS HCC)    Pulmonary HTN (CMS HCC)    Pneumonia due to COVID-19 virus    Mood disorder (CMS HCC)    Anemia, unspecified type    Cigarette smoker    Metastatic cancer to liver    History of breast cancer     Chronic    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)    Paroxysmal atrial fibrillation (CMS HCC)     Chronic    Hypertension     Chronic    CAD (coronary artery disease)      Assessment/Plan:    68 year old female presenting with acute hypoxic respiratory failure:    Acute hypoxic respiratory failure:  CXR revealing vague pulmonary opacities that could be mild PNA or atelectasis.  She is COVID positive.  CTA chest to rule out PE to be completed.  She is on IV antibiotics.    Small-cell lung CA with liver metastasis:  Follows with Medical Oncology on outpatient basis at [facility].  She was to receive carboplatin/etoposide/Tecentriq on 07/08/2024 when she presented with acute respiratory symptoms that prompted further evaluation by the emergency room as stated in the above HPI.  Withhold current chemotherapy regimen until resolution of acute conditions.    Normocytic hypochromic anemia:   Current H/H = 7.7/24.3.  Recent PRBC transfusions.  No acute blood loss noted.  Could be secondary to acute illness or metastatic disease.  Vitamin B12 normal at 1106.  Folate low at 3.5.  Started on daily folic acid  if not already taking.  Copper normal at 113.  Noted transaminitis as well as elevated alk-phos.  This is likely related to liver mets.  Continue to monitor counts.  Transfuse with PRBC if hemoglobin less than 7 grams/deciliter or greater than 7 and symptomatic.  Will benefit from blood transfusion.    Thrombocytopenia:  Current counts = 56,000.  No overt bruising or bleeding.  Likely secondary to underlying metastatic disease.  Defer further thrombocytopenia panel at this time.  Continue to monitor  counts.  Transfuse with platelets if <50 K and actively bleeding or <10 K w/wo bleeding.    Cholelithiasis: General surgery is following.  She is s/p cholecystectomy  07/11/2024.  Noted hemoperitoneum per recent CT abdomen/pelvis.  General surgery without further surgical intervention at this time - recommending serial H/H checks.    Hypertension: Managed with antihypertensives.    HLD:  On statin.    CAD:  History of CABG x1.  On cardioprotective medication, 81 mg ASA daily.      Rosina Ice, APRN, CNP      Attending note:  I have independently seen and examined the patient on the same date of service as our mid-level.  I agree with the plan of care as documented by my mid-level.  I have personally reviewed  the pertinent medications, labs  and radiographic studies  as documented in my mid-level note.  The case was discussed in detail with the midlevel provider.  I agree with the above note.  Some changes and additions were made.  I spent more than 50% of the total encounter time for the care of this patient and documentation.      Norleen Amen, MD

## 2024-07-13 NOTE — Progress Notes (Signed)
 [facility]  Progress Note    [name]  Date of service: 07/13/2024   Date of Admission:  07/08/2024    Hospital Day:  LOS: 5 days     Chief Complaint: shortness of breath     Subjective:   [name] is a 68 y.o. female w/recently diagnosed Small-cell carcinoma w/liver mets, COPD, current smoker - not on O2 at home, CABG. She was found to have lung mass on last admission and underwent bronchoscopy w/bx of station 7 which revealed the small cell carcinoma. She came to hospital from outpatient infusion center to start chemotherapy but had dyspnea and hypoxia and was sent to ER for further evaluation. She was found to have COVID. She reports she's been feeling bad for the last few weeks. CTA chest - no PE, marginal increase in size of right infrahilar mass, right hilar and mediastinal lymphadenopathy compared to prior study. CT A/P: hepatomegaly, hepatic steatosis, and hepatic lesions not much change, new pericholecystic fluid now present. We are consulted for acute hypoxic respiratory failure, known patient.      [name] currently on 2 L NC/93%.  She is awake and alert.  Feels breathing is doing okay at present time.  Has some mild cough.  No chest pain.  No fevers noted.  No N/V/D.  No associated symptoms or modifying factors.      10/2: Patient on 3LNC/96%. Awake and alert. Feels breathing is improving. She is s/p lap chole. No chest pain. No fevers noted. No n/v/d.  No associated symptoms or modifying factors.      10/3: Patient on room air/97%. Denies shortness of breath. Not much cough. No chest pain. Having bowel movements. No obvious signs of bleeding. Hgb 6.2 this morning, getting blood transfusion.  No associated symptoms or modifying factors.      10/4:  Today on 3L NC.  Received 2 units PRBCs yesterday.  Hemoglobin up to 8.1 today.  Receiving breathing treatment on exam.  Patient states she is not feeling much better today.    Vital Signs:  Temp  Avg: 36.6 C (97.9 F)   Min: 36.4 C (97.5 F)  Max: 37.2 C (99 F)    Pulse  Avg: 89.1  Min: 77  Max: 118 BP  Min: 88/62  Max: 125/71   Resp  Avg: 16.3  Min: 14  Max: 18 SpO2  Avg: 94.7 %  Min: 92 %  Max: 98 %          Input/Output    Intake/Output Summary (Last 24 hours) at 07/13/2024 1206  Last data filed at 07/13/2024 0732  Gross per 24 hour   Intake 2101.25 ml   Output --   Net 2101.25 ml    I/O last shift:  10/04 0700 - 10/04 1859  In: 435.83 [Blood:335.83]  Out: -    albuterol  (PROVENTIL ) 2.5 mg / 3 mL (0.083%) neb solution, 2.5 mg, Nebulization, 4x/day  aluminum-magnesium  hydroxide-simethicone (MAG-AL PLUS) 200-200-20 mg per 5 mL oral liquid, 30 mL, Oral, Q4H PRN  amiodarone  (NEXTERONE ) 360 mg in D5W 200mL premix infusion, 0.5 mg/min, Intravenous, Continuous  anastrozole  (ARIMIDEX ) tablet, 1 mg, Oral, Daily  budesonide (PULMICORT RESPULES) 0.5 mg/2 mL nebulizer suspension, 1 mg, Nebulization, 2x/day   And  arformoterol (BROVANA) 15 mcg/2 mL nebulizer solution, 15 mcg, Nebulization, 2x/day  atorvastatin  (LIPITOR) tablet, 40 mg, Oral, Daily  benzonatate (TESSALON) capsule, 100 mg, Oral, Q8H PRN  cefpodoxime (VANTIN) tablet, 400 mg, Oral, 2x/day-Food  cholecalciferol  (VITAMIN D3) 1000 unit (25 mcg) tablet, 1,000 Units, Oral, Daily  clonazePAM  (klonoPIN ) tablet, 0.5 mg, Oral, 2x/day PRN  NS 250 mL flush bag, , Intravenous, Q15 Min PRN   And  D5W 250 mL flush bag, , Intravenous, Q15 Min PRN  dexAMETHasone  (DECADRON ) tablet 6 mg, 6 mg, Oral, Daily  diphenhydrAMINE (BENADRYL) capsule, 25 mg, Oral, HS PRN  DULoxetine  (CYMBALTA ) delayed release capsule, 60 mg, Oral, Daily  [Held by provider] enoxaparin PF (LOVENOX) 40 mg/0.4 mL SubQ injection, 40 mg, Subcutaneous, Q24H  fluticasone  (FLONASE ) 50 mcg per spray nasal spray, 1 Spray, Each Nostril, Daily PRN  folic acid  (FOLVITE ) tablet, 1 mg, Oral, Daily  gabapentin  (NEURONTIN ) capsule, 400 mg, Oral, 3x/day  HYDROcodone -acetaminophen  (NORCO) 5-325 mg per tablet, 1 Tablet, Oral, Q6H PRN  losartan   (COZAAR ) tablet, 25 mg, Oral, Daily  magnesium  hydroxide (MILK OF MAGNESIA) 400mg  per 5mL oral liquid, 15 mL, Oral, 4x/day PRN  metoprolol  tartrate (LOPRESSOR ) tablet, 25 mg, Oral, 2x/day  multivitamin-minerals-iron  oral liquid, 15 mL, Oral, Daily  NS flush syringe, 3 mL, Intracatheter, Q8HRS  NS flush syringe, 3 mL, Intracatheter, Q1H PRN  OLANZapine  (zyPREXA ) tablet, 5 mg, Oral, NIGHTLY  omega-3 fatty acids (LOVAZA ) capsule, 1 g, Oral, Daily  ondansetron  (ZOFRAN ) 2 mg/mL injection, 4 mg, Intravenous, Q6H PRN  pantoprazole  (PROTONIX ) delayed release tablet, 40 mg, Oral, Daily Before Lunch  polyethylene glycol (MIRALAX) oral packet, 17 g, Oral, Daily  prochlorperazine  (COMPAZINE ) tablet, 10 mg, Oral, 3x/day PRN  traZODone  (DESYREL ) tablet, 150 mg, Oral, NIGHTLY        Physical Exam:    GENERAL:   SKIN: Warm and dry.  HEAD: Atraumatic, normocephalic.  EARS AND NOSE: Normal to examination.  NECK: Supple, Trachea midline.  CARDIAC: Regular rate and rhythm.  LUNGS: Clear to auscultation bilaterally. No wheezes or rhonchi. No accessory muscle use noted.  GI: Abdomen soft, non-tender, non-distended.   MUSCULOSKELETAL: Good muscle strength throughout.  EXTREMITIES: No pedal edema noted. Distal pulses equal bilaterally.  NEUROLOGICAL: Cranial nerves grossly intact. No gross focal deficit.  PSYCHIATRIC: Appropriate mood and affect.     Labs:     Results for orders placed or performed during the hospital encounter of 07/08/24 (from the past 24 hours)   CBC   Result Value Ref Range    WBC 8.4 3.7 - 11.0 x10^3/uL    RBC 3.03 (L) 3.85 - 5.22 x10^6/uL    HGB 8.5 (L) 11.5 - 16.0 g/dL    HCT 73.5 (L) 65.1 - 46.0 %    MCV 87.1 78.0 - 100.0 fL    MCH 28.1 26.0 - 32.0 pg    MCHC 32.2 31.0 - 35.5 g/dL    RDW-CV 84.4 88.4 - 84.4 %    PLATELETS 41 (L) 150 - 400 x10^3/uL    MPV 10.7 8.7 - 12.5 fL   PRODUCT: FFP/PLASMA - UNITS : 2 Units   Result Value Ref Range    Coding System [id number]     UNIT NUMBER [id number]     BLOOD COMPONENT TYPE  THAWED PLASMA     UNIT DIVISION 00     UNIT DISPENSE STATUS ISSUED     TRANSFUSION STATUS OK TO TRANSFUSE     Product Code [id number]     Coding System ISBT128     UNIT NUMBER [id number]     BLOOD COMPONENT TYPE THAWED PLASMA     UNIT DIVISION 00     UNIT DISPENSE STATUS ISSUED,FINAL     TRANSFUSION STATUS OK  TO TRANSFUSE     Product Code [phone]    PRODUCT: PLATELETS - UNITS , 2 Units   Result Value Ref Range    Coding System ISBT128     UNIT NUMBER [id number]     BLOOD COMPONENT TYPE PATHOGEN REDUCED PLASMA REDUCED1     UNIT DIVISION 00     UNIT DISPENSE STATUS ISSUED     TRANSFUSION STATUS OK TO TRANSFUSE     Product Code [id number]    PT/INR   Result Value Ref Range    PROTHROMBIN TIME 16.4 (H) 12.1 - 15.3 seconds    INR 1.29 (H) 0.86 - 1.14   PTT (PARTIAL THROMBOPLASTIN TIME) - AM ONCE   Result Value Ref Range    APTT 20.4 (L) 22.4 - 34.8 seconds   COMPREHENSIVE METABOLIC PANEL, NON-FASTING   Result Value Ref Range    SODIUM 142 133 - 144 mmol/L    POTASSIUM 3.6 3.2 - 5.0 mmol/L    CHLORIDE 104 96 - 106 mmol/L    CO2 TOTAL 21 (L) 22 - 30 mmol/L    ANION GAP 17 7 - 18 mmol/L    BUN 50 (H) 8 - 23 mg/dL    CREATININE 8.74 (H) 0.50 - 0.90 mg/dL    ESTIMATED GFR 47 (L) >90 mL/min/1.56m^2    ALBUMIN 2.9 (L) 3.5 - 5.2 g/dL    CALCIUM 9.0 8.3 - 89.2 mg/dL    GLUCOSE 865 (H) 74 - 109 mg/dL    ALKALINE PHOSPHATASE 378 (H) 35 - 129 U/L    ALT (SGPT) 899 (H) 0 - 33 U/L    AST (SGOT) 1,898 (H) 0 - 32 U/L    BILIRUBIN TOTAL 1.6 (H) 0.2 - 1.2 mg/dL    PROTEIN TOTAL 5.7 (L) 6.4 - 8.3 g/dL   MAGNESIUM    Result Value Ref Range    MAGNESIUM  2.3 1.6 - 2.4 mg/dL   CBC WITH DIFF   Result Value Ref Range    WBC 7.6 3.7 - 11.0 x10^3/uL    RBC 2.92 (L) 3.85 - 5.22 x10^6/uL    HGB 8.1 (L) 11.5 - 16.0 g/dL    HCT 74.1 (L) 65.1 - 46.0 %    MCV 88.4 78.0 - 100.0 fL    MCH 27.7 26.0 - 32.0 pg    MCHC 31.4 31.0 - 35.5 g/dL    RDW-CV 84.2 (H) 88.4 - 15.5 %    PLATELETS 40 (L) 150 - 400 x10^3/uL    MPV 11.2 8.7 - 12.5 fL   MANUAL DIFF AND  MORPHOLOGY-SYSMEX   Result Value Ref Range    NEUTROPHIL % 66 %    LYMPHOCYTE %  25 %    MONOCYTE % 1 %    EOSINOPHIL % 0 %    BASOPHIL % 0 %    NEUTROPHIL BANDS % 6   %    PROMYELOCYTE % MANUAL 1 (H) <=0 %    REACTIVE LYMPHOCYTE % 1 %    NEUTROPHIL # 5.47 1.50 - 7.70 x10^3/uL    LYMPHOCYTE # 1.98 1.00 - 4.80 x10^3/uL    MONOCYTE # <0.10 (L) 0.20 - 1.10 x10^3/uL    EOSINOPHIL # <0.10 <=0.50 x10^3/uL    BASOPHIL # <0.10 <=0.20 x10^3/uL    NRBC FROM MANUAL DIFF 17 (H) <=1 per 100 WBC    ACANTHOCYTES (SPUR CELL) 2+/Moderate (A) None    ANISOCYTOSIS 2+/Moderate (A) None   ECG 12 LEAD   Result Value Ref Range    Ventricular  rate 99 BPM    Atrial Rate 99 BPM    PR Interval 148 ms    QRS Duration 90 ms    QT Interval 370 ms    QTC Calculation 474 ms    Calculated P Axis 68 degrees    Calculated R Axis 58 degrees    Calculated T Axis 62 degrees   PT/INR   Result Value Ref Range    PROTHROMBIN TIME 16.8 (H) 12.1 - 15.3 seconds    INR 1.33 (H) 0.86 - 1.14   LACTIC ACID LEVEL W/ REFLEX FOR LEVEL >2.0   Result Value Ref Range    LACTIC ACID 2.8 (H) 0.5 - 2.0 mmol/L   CBC WITH DIFF   Result Value Ref Range    WBC 8.2 3.7 - 11.0 x10^3/uL    RBC 2.84 (L) 3.85 - 5.22 x10^6/uL    HGB 7.7 (L) 11.5 - 16.0 g/dL    HCT 75.6 (L) 65.1 - 46.0 %    MCV 85.6 78.0 - 100.0 fL    MCH 27.1 26.0 - 32.0 pg    MCHC 31.7 31.0 - 35.5 g/dL    RDW-CV 84.1 (H) 88.4 - 15.5 %    PLATELETS 56 (L) 150 - 400 x10^3/uL    MPV 10.4 8.7 - 12.5 fL   MANUAL DIFF AND MORPHOLOGY-SYSMEX   Result Value Ref Range    NEUTROPHIL % 73 %    LYMPHOCYTE %  20 %    MONOCYTE % 2 %    EOSINOPHIL % 1 %    BASOPHIL % 0 %    NEUTROPHIL BANDS % 2   %    METAMYELOCYTE %  2   %    NEUTROPHIL # 6.15 1.50 - 7.70 x10^3/uL    LYMPHOCYTE # 1.64 1.00 - 4.80 x10^3/uL    MONOCYTE # 0.16 (L) 0.20 - 1.10 x10^3/uL    EOSINOPHIL # <0.10 <=0.50 x10^3/uL    BASOPHIL # <0.10 <=0.20 x10^3/uL    NRBC FROM MANUAL DIFF 15 (H) <=1 per 100 WBC   COMPREHENSIVE METABOLIC PANEL, NON-FASTING   Result Value  Ref Range    SODIUM 145 (H) 133 - 144 mmol/L    POTASSIUM 3.4 3.2 - 5.0 mmol/L    CHLORIDE 104 96 - 106 mmol/L    CO2 TOTAL 23 22 - 30 mmol/L    ANION GAP 18 7 - 18 mmol/L    BUN 49 (H) 8 - 23 mg/dL    CREATININE 8.89 (H) 0.50 - 0.90 mg/dL    ESTIMATED GFR 55 (L) >90 mL/min/1.49m^2    ALBUMIN 3.1 (L) 3.5 - 5.2 g/dL    CALCIUM 9.0 8.3 - 89.2 mg/dL    GLUCOSE 850 (H) 74 - 109 mg/dL    ALKALINE PHOSPHATASE 385 (H) 35 - 129 U/L    ALT (SGPT) 792 (H) 0 - 33 U/L    AST (SGOT) 1,527 (H) 0 - 32 U/L    BILIRUBIN TOTAL 1.8 (H) 0.2 - 1.2 mg/dL    PROTEIN TOTAL 5.8 (L) 6.4 - 8.3 g/dL   MAGNESIUM  - ONCE   Result Value Ref Range    MAGNESIUM  2.0 1.6 - 2.4 mg/dL   LACTIC ACID - FIRST REFLEX   Result Value Ref Range    LACTIC ACID 4.4 (HH) 0.5 - 2.0 mmol/L      Radiology:      Recent Results (from the past 824799999 hours)   CT CHEST WO IV CONTRAST    Collection Time:  12/28/23  7:06 AM    Narrative    Brystol JEAN Pursell    CT CHEST WO IV CONTRAST performed on 12/28/2023 7:06 AM.    INDICATION:  R07.89: Sternal pain   Additional History:  cough, shortness of breath, sternal pain, hx smoker, open heart surgery    TECHNIQUE:  Unenhanced CT chest with axial, coronal, and sagittal multiplanar reformations. Dose modulation, automated exposure control, and/or iterative reconstruction were used for dose reduction.    COMPARISON:  12/18/2009.  ___________________________________  FINDINGS:    * No mediastinal or axillary adenopathy.  * Atherosclerosis with nonaneurysmal aortic and coronary artery involvement post CABG and possibly stenting.  * Normal heart size.  * No pleural effusion.  * Mild emphysema. No focal consolidation, pulmonary edema or mass.  * Patent central airways.  * Bilateral nephrolithiasis. Nondistended gallbladder with cholelithiasis.  * No pertinent osseous or body wall findings. Unremarkable sternotomy. Bilateral mastectomy.    ___________________________________    Impression    1. No acute or suspicious findings in the  chest.  2. Emphysema.  3. Incidental cholelithiasis and nephrolithiasis.       Radiologist location ID: [id number]       Recent Results (from the past [id number] hours)   XR AP MOBILE CHEST    Collection Time: 07/08/24 10:48 AM    Narrative    Laylia JEAN Whiteford    XR AP MOBILE CHEST performed on 07/08/2024 10:48 AM.    INDICATION:  Chest Pain   Additional History:  chest pain    TECHNIQUE:  Single portable frontal view of the chest.  1 views/1 images submitted for interpretation.    COMPARISON:  Chest x-ray 06/17/2024  ___________________________________  FINDINGS:    Median sternotomy noted. Heart size and vascularity are similar. Right hilar adenopathy. Heart size normal.  Vague groundglass opacities at both lung bases and minimally right upper lobe atelectasis or minimal pneumonitis. No pleural effusion. No pneumothorax. Dextrocurvature thoracic spine.  ___________________________________    Impression    1. Right hilar adenopathy.    2. Vague pulmonary opacities could be mild pneumonia or atelectasis.        Radiologist location ID: [id number]     XR CHEST AP    Collection Time: 06/14/24  1:25 PM    Narrative    Coriann JEAN Akre    XR CHEST AP, 06/14/2024 1:25 PM    INDICATION:  dyspnea, respiratory illness   Additional History:  dyspnea, respiratory illness    TECHNIQUE:  Single portable frontal view of the chest.  1 views/1 images submitted for interpretation.  COMPARISON: 09/14/2023.  ___________________________________  FINDINGS:    The heart is within normal limits for size. Median sternotomy wires, inferior surgical fusion, and numerous bilateral breast surgical clips are stable. The right hilum is enlarged. No effusion, infiltrate, or pneumothorax is seen. Bilateral apical pulmonary scar is stable. There is mild dextrocurvature of the thoracic spine.  ___________________________________    Impression    Right hilar enlargement may represent mass, adenopathy or vascular enlargement. Enhanced CT chest would be  the test of choice for further evaluation.      A Significant Orange actionable finding has been sent via the [employer] Actionable Findings application on 06/14/2024 1:55 PM, Message ID [id number]. Receipt of this communication will be communicated to [facility] RADIOLOGY STAFF or responsible provider and will be documented in [employer] Actionable Findings System upon receiving the acknowledgement.      Radiologist location ID: [id number]  XR CHEST PA AND LATERAL    Collection Time: 02/23/23  2:16 PM    Narrative    Evelyna JEAN Leiphart    XR CHEST PA AND LATERAL performed on 02/23/2023 2:16 PM.    INDICATION:  F17.200: Tobacco use disorder   Additional History:  Post-op CABG, H/O HTN, CAD, tobacco use    TECHNIQUE: Frontal and lateral views of the chest    COMPARISON:  02/09/2023  ___________________________________  FINDINGS:    Mild left basilar atelectasis or infiltrate and trace pleural effusion. Findings appear improved. Right lung appears clear with improved aeration right lower lung zone. No pneumothorax.    Heart size is borderline. Pulmonary vascularity is borderline.     Postop changes of CABG. Postop changes cervical spine.      Impression    Chest improved          Radiologist location ID: [id number]           Assessment/ Plan:   Active Hospital Problems    Diagnosis    Primary Problem: Acute hypoxic respiratory failure (CMS HCC)    Ventricular tachycardia (CMS HCC)    Acute cholecystitis    Thickening of wall of gallbladder with pericholecystic fluid    Symptomatic anemia    COVID-19    Acute blood loss anemia    Transaminitis    Malignant neoplasm of lung, unspecified laterality, unspecified part of lung (CMS HCC)    Pulmonary HTN (CMS HCC)    Pneumonia due to COVID-19 virus    Mood disorder (CMS HCC)    Anemia, unspecified type    Cigarette smoker    Metastatic cancer to liver    History of breast cancer    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)    Paroxysmal atrial fibrillation (CMS  HCC)    Hypertension    CAD (coronary artery disease)     PLAN:  Patient seen and examined. Reviewed pertinent labs, imaging, and notes.  68 y/o female recently dx SCLC w/liver mets, COPD not on O2 at home, current smoker, was out outpatient infusion center to start chemo and came in for dyspnea and hypoxia, found to have COVID  CTA chest - no PE, marginal increase in size of right infrahilar mass, right hilar and mediastinal lymphadenopathy compared to prior study. CT A/P: hepatomegaly, hepatic steatosis, and hepatic lesions not much change, new pericholecystic fluid now present.   Wean oxygen as patient tolerates for SpO2 >/=92%, on 3 L today  Continue steroids  Agree with empiric antibiotics-ceftriaxone  Urine antigens negative  U/S showed acute cholecystitis with pericholecystic fluid and wall thickening  She is now s/p lap chole on 10/2  Had repeat CT of abdomen and pelvis which did not show increasing hemoperitoneum  Discussed case with Dr. Fabiene during rounding and he advised following serial hemoglobin and coags, no surgical intervention right now  Continue Brovana/Pulmicort   Sputum culture grew light growth yeast   Hgb 6.2 this morning, getting x2 units blood; no overt signs of bleeding  Fecal occult ordered   Continue nebs  This patient was seen and examined with my attending and he will add any further recommendations.       Ginger Moors, APRN, CNP,    _______________________________    I personally saw and evaluated the patient as part of a shared service with an APP.    My substantive findings are:  MDM (complete) I have personally managed 1 acute illness with  systemic symptoms.  (COVID and acute cholecystitis requiring surgery and hospitalization)  I have reviewed and actively participated in management of patient's prescription medications.  Continue albuterol  Brovana and budesonide nebs.  Continue Decadron  for COVID.  Can likely stop the near future.  Increase activity.  Hemoglobin has decreased.   Case discussed with Dr. Fabiene with General surgery and did not feel that this is due to recent surgery.  CT abdomen reviewed and noted below.      CT scan of the abdomen images done today were independently viewed: Grossly stable hematoma in the gallbladder fossa.        Elsie Marsa Gully, MD  07/13/2024 14:22

## 2024-07-13 NOTE — Assessment & Plan Note (Signed)
S/p mastectomy 

## 2024-07-13 NOTE — Nurses Notes (Addendum)
 Afib with rate up to 180.  Amiodarone  bolus and gtt initiated. Complained of slight shortness of breath. 118/72 Hr 128  Dr Sharyle at bedside to evaluate pt.  Complained of increased abd pain and increase in abdominal distention.  Right lower puncture site dressing saturated with blood. Dressing changed.  Dr Shaunna at bedside and updated Dr Fabiene via phone. Plan of care ongoing

## 2024-07-13 NOTE — Assessment & Plan Note (Signed)
 Likely due to COVID and possible superimposed bacterial pneumonia  Currently on 2 L nasal cannula  Respiratory panel positive for COVID  CT angiogram did not show any evidence pulmonary embolism although marginal increase in the size of infrahilar mass, right hilar and mediastinal lymphadenopathy.  Continue IV Decadron    Continue empiric IV Rocephin  Continue Brovana and Pulmicort   Pulmonary is following, appreciate input

## 2024-07-13 NOTE — Assessment & Plan Note (Signed)
 Worsening metastatic liver disease likely causing elevated transaminases

## 2024-07-13 NOTE — Assessment & Plan Note (Signed)
 Acute blood loss anemia or acute on chronic anemia, and mild bleeding from 1 of the laparoscopic incisions  Likely secondary to acute bleeding and underlying metastatic disease  S/p vitamin K 10 mg IV x1 dose on 10/03,   S/p 2 units PRBC transfusion yesterday, hemoglobin this morning 8.2 trended down to 7.7   She also received 2 units platelets and 2 units FFP last night   CT abdomen done yesterday evening showed hematoma associated with the gallbladder fossa with fluid tracking inferiorly, moderate volume hemoperitoneum  CTA abdomen repeated this morning due to patient complaining of worsening abdomen pain and distention, showed stable hematoma in gallbladder fossa with slight decrease in volume of hemoperitoneum   Anemia panel not suggestive of iron  deficiency  Stool occult blood   Monitor CBC and transfuse for hemoglobin less than 7  Will monitor H and H q.12 hours for 24 hours  General surgery and Hematology are following, appreciate input

## 2024-07-13 NOTE — Assessment & Plan Note (Signed)
 Known history of small-cell carcinoma, planned for outpatient chemotherapy with carboplatin/etoposide/tecenteriq  Oncology is following, recommended to withhold current chemotherapy regimen until acute illness resolves

## 2024-07-13 NOTE — Assessment & Plan Note (Signed)
 Patient developed nonsustained V-tach last night   On amiodarone  drip   Evaluated by Cardiology, recommended to continue amiodarone  drip and patient is not a candidate for anticoagulation due to anemia and thrombocytopenia  Appreciate cardiology input

## 2024-07-13 NOTE — Nurses Notes (Signed)
 Received pt to room 1427. Pt is A&Ox4, Respirations even and unlabored. No acute distress noted. Vital signs stable. Sinus rhythm on tele monitor. Bed locked and in lowest position. Side rails up x 2. Phone, call light, and bedside table within reach.

## 2024-07-13 NOTE — Progress Notes (Signed)
 [facility] Coverage Note          Name:  [name] MRN:  [id number]   Admission Date: 07/08/2024   DOB:  November 04, 1955 (68 y.o.)   Date of service: 07/13/2024       Patient had another episode of nonsustained V-tach.  I reviewed her telemetry monitor at that time, patient was in atrial fibrillation with RVR.  So I question if this is atrial fibrillation with RVR and aberrancy.  Blood pressure is soft, so IV amiodarone  was ordered.  Discussed with nursing supervision to arrange transfer.  Discussed with patient and nursing staff at the bedside.  All questions were answered.  EKG ordered.  Went ahead and collected morning labs.  H&H 8.1/25.8.  Unfortunately, platelets came down to 40.  PT/INR are relatively the same.  PTT is low at 20.4.  Was previously normal.  Metabolic panel shows an AKI.  BUN 50, creatinine 1.25, GFR 47.  Albumin the same at 2.9.  Alk phos elevated at 378, similar to previous.  ALT significantly different with elevation at 899, AST elevated significantly differently at 1898, total bili elevated at 1.6, total protein low at 5.7.  GI is already following.    Overnight Coverage Note:  Otherwise no change in plan of care.  Continue current medical management and supportive care.  Will defer further management decisions to primary team in the morning.    Warren Edsel Riggs, APRN, CNP  07/13/2024, 04:27

## 2024-07-13 NOTE — Assessment & Plan Note (Signed)
 Continue trazodone

## 2024-07-13 NOTE — Care Plan (Signed)
 Problem: Wound  Goal: Optimal Coping  Outcome: Ongoing (see interventions/notes)  Goal: Optimal Functional Ability  Outcome: Ongoing (see interventions/notes)  Goal: Absence of Infection Signs and Symptoms  Outcome: Ongoing (see interventions/notes)  Goal: Improved Oral Intake  Outcome: Ongoing (see interventions/notes)  Goal: Optimal Pain Control and Function  Outcome: Ongoing (see interventions/notes)  Goal: Skin Health and Integrity  Outcome: Ongoing (see interventions/notes)  Goal: Optimal Wound Healing  Outcome: Ongoing (see interventions/notes)     Problem: Adult Inpatient Plan of Care  Goal: Plan of Care Review  Outcome: Ongoing (see interventions/notes)  Goal: Patient-Specific Goal (Individualized)  Outcome: Ongoing (see interventions/notes)  Goal: Absence of Hospital-Acquired Illness or Injury  Outcome: Ongoing (see interventions/notes)  Goal: Optimal Comfort and Wellbeing  Outcome: Ongoing (see interventions/notes)  Goal: Rounds/Family Conference  Outcome: Ongoing (see interventions/notes)

## 2024-07-13 NOTE — Care Plan (Signed)
 Problem: Wound  Goal: Optimal Coping  Outcome: Ongoing (see interventions/notes)  Goal: Optimal Functional Ability  Outcome: Ongoing (see interventions/notes)  Goal: Absence of Infection Signs and Symptoms  Outcome: Ongoing (see interventions/notes)  Intervention: Prevent or Manage Infection  Recent Flowsheet Documentation  Taken 07/13/2024 0715 by Andrez RAMAN, RN  Isolation Precautions: droplet precautions maintained  Infection Management: aseptic technique maintained  Goal: Improved Oral Intake  Outcome: Ongoing (see interventions/notes)  Goal: Optimal Pain Control and Function  Outcome: Ongoing (see interventions/notes)  Goal: Skin Health and Integrity  Outcome: Ongoing (see interventions/notes)  Intervention: Optimize Skin Protection  Recent Flowsheet Documentation  Taken 07/13/2024 1100 by Andrez RAMAN, RN  Pressure Reduction Techniques:   Frequent weight shifting encouraged   Moisture, shear and nutrition are maximized   Mobility is maximized  Pressure Reduction Devices: Repositioning wedges/pillows utilized  Taken 07/13/2024 0715 by Andrez RAMAN, RN  Pressure Reduction Devices: Repositioning wedges/pillows utilized  Goal: Optimal Wound Healing  Outcome: Ongoing (see interventions/notes)  Intervention: Promote Wound Healing  Recent Flowsheet Documentation  Taken 07/13/2024 1100 by Andrez RAMAN, RN  Pressure Reduction Techniques:   Frequent weight shifting encouraged   Moisture, shear and nutrition are maximized   Mobility is maximized  Pressure Reduction Devices: Repositioning wedges/pillows utilized  Taken 07/13/2024 0715 by Andrez RAMAN, RN  Pressure Reduction Devices: Repositioning wedges/pillows utilized     Problem: Adult Inpatient Plan of Care  Goal: Plan of Care Review  Outcome: Ongoing (see interventions/notes)  Goal: Patient-Specific Goal (Individualized)  Outcome: Ongoing (see interventions/notes)  Flowsheets (Taken 07/13/2024 0715)  Individualized Care Needs: blood products, heart rate control  Anxieties, Fears or Concerns:  heart rate and bleeding  Goal: Absence of Hospital-Acquired Illness or Injury  Outcome: Ongoing (see interventions/notes)  Intervention: Identify and Manage Fall Risk  Recent Flowsheet Documentation  Taken 07/13/2024 1600 by Andrez RAMAN, RN  Safety Promotion/Fall Prevention:   activity supervised   fall prevention program maintained   nonskid shoes/slippers when out of bed   safety round/check completed  Taken 07/13/2024 0715 by Andrez RAMAN, RN  Safety Promotion/Fall Prevention:   activity supervised   fall prevention program maintained   nonskid shoes/slippers when out of bed   safety round/check completed  Intervention: Prevent Skin Injury  Recent Flowsheet Documentation  Taken 07/13/2024 1600 by Andrez RAMAN, RN  Body Position: supine, head elevated  Taken 07/13/2024 1100 by Andrez RAMAN, RN  Skin Protection:   adhesive use limited   tubing/devices free from skin contact  Taken 07/13/2024 0715 by Andrez RAMAN, RN  Skin Protection: adhesive use limited  Intervention: Prevent and Manage VTE (Venous Thromboembolism) Risk  Recent Flowsheet Documentation  Taken 07/13/2024 0800 by Andrez RAMAN, RN  Sequential Compression Device (SCDs):   Sequential compression device off   patient refused intervention  Taken 07/13/2024 0715 by Andrez RAMAN, RN  VTE Prevention/Management: ambulation promoted  Intervention: Prevent Infection  Recent Flowsheet Documentation  Taken 07/13/2024 0715 by Andrez RAMAN, RN  Infection Prevention: barrier precautions utilized  Goal: Optimal Comfort and Wellbeing  Outcome: Ongoing (see interventions/notes)  Intervention: Provide Person-Centered Care  Recent Flowsheet Documentation  Taken 07/13/2024 0715 by Andrez RAMAN, RN  Trust Relationship/Rapport:   care explained   choices provided   emotional support provided  Goal: Rounds/Family Conference  Outcome: Ongoing (see interventions/notes)   Care delivered per above plan.

## 2024-07-13 NOTE — Assessment & Plan Note (Signed)
 Rate controlled, continue metoprolol , not on anticoagulation

## 2024-07-13 NOTE — Nurses Notes (Signed)
 Patient being transferred to 4p 1427. Report called to Devin RN. Patients dressing to right lower abdomen was changed by this nurse before transfer, noted to have moderate amount of bloody drainage on old dressing. Heart rate was still sinus tachy with pvc's. Patient stated that she felt better with mild discomfort to right lower abdomen. Received 2 units prbcs, 2 units ffp's before transfer also. Blood pressure normal and no temp, patient alert and oriented x 4 answering questions. All belongings sent with patient.

## 2024-07-13 NOTE — Progress Notes (Signed)
 [facility] Progress Note    [name]       68 y.o.       Date of service: 07/13/2024  Date of Admission:  07/08/2024    Hospital Day:  LOS: 5 days   CC: Acute hypoxic respiratory failure (CMS HCC)    Subjective:  Overnight events noted.  Patient's CT abdomen done yesterday evening showed hematoma in gallbladder fossa and hemoperitoneum.  General surgery recommended to transfuse FFP and platelet and to continue to monitor.  She received 2 units if PEs and 2 units platelets last night.  Patient overnight had run of nonsustained V-tach.  She was started on amiodarone  drip.  Staff reported that patient's abdomen was more distended patient complained of pain.  CT abdomen was repeated and showed stable hematoma.  Patient was seen and examined at bedside this morning.  She stated abdomen pain was more this morning and felt her abdomen was distended.  When I saw her abdomen pain was better, stated she was trying to get some rest.  She denied any shortness of breath.  She continues to have mild bleeding from right mid abdomen laparoscopic incision.  She received 2 units PRBC transfusion yesterday, hemoglobin early this morning was 8.1, repeat 7.7.    Objective:   Filed Vitals:    07/13/24 0905 07/13/24 0916 07/13/24 0918 07/13/24 1137   BP:  118/67 118/67 115/65   Pulse:  96  81   Resp: 14   15   Temp:    36.5 C (97.7 F)   SpO2:    96%     Oxygen Device  SpO2: 96 %  Flow (L/min) (Oxygen Therapy): 2   Flow (L/Min) (NICU-PICU): 2 LPM    albuterol  (PROVENTIL ) 2.5 mg / 3 mL (0.083%) neb solution, 2.5 mg, Nebulization, 4x/day  aluminum-magnesium  hydroxide-simethicone (MAG-AL PLUS) 200-200-20 mg per 5 mL oral liquid, 30 mL, Oral, Q4H PRN  amiodarone  (NEXTERONE ) 360 mg in D5W 200mL premix infusion, 0.5 mg/min, Intravenous, Continuous  anastrozole  (ARIMIDEX ) tablet, 1 mg, Oral, Daily  budesonide (PULMICORT RESPULES) 0.5 mg/2 mL nebulizer suspension, 1 mg, Nebulization, 2x/day    And  arformoterol (BROVANA) 15 mcg/2 mL nebulizer solution, 15 mcg, Nebulization, 2x/day  atorvastatin  (LIPITOR) tablet, 40 mg, Oral, Daily  benzonatate (TESSALON) capsule, 100 mg, Oral, Q8H PRN  cefpodoxime (VANTIN) tablet, 400 mg, Oral, 2x/day-Food  cholecalciferol  (VITAMIN D3) 1000 unit (25 mcg) tablet, 1,000 Units, Oral, Daily  clonazePAM  (klonoPIN ) tablet, 0.5 mg, Oral, 2x/day PRN  NS 250 mL flush bag, , Intravenous, Q15 Min PRN   And  D5W 250 mL flush bag, , Intravenous, Q15 Min PRN  dexAMETHasone  (DECADRON ) tablet 6 mg, 6 mg, Oral, Daily  diphenhydrAMINE (BENADRYL) capsule, 25 mg, Oral, HS PRN  DULoxetine  (CYMBALTA ) delayed release capsule, 60 mg, Oral, Daily  [Held by provider] enoxaparin PF (LOVENOX) 40 mg/0.4 mL SubQ injection, 40 mg, Subcutaneous, Q24H  fluticasone  (FLONASE ) 50 mcg per spray nasal spray, 1 Spray, Each Nostril, Daily PRN  folic acid  (FOLVITE ) tablet, 1 mg, Oral, Daily  gabapentin  (NEURONTIN ) capsule, 400 mg, Oral, 3x/day  HYDROcodone -acetaminophen  (NORCO) 5-325 mg per tablet, 1 Tablet, Oral, Q6H PRN  losartan  (COZAAR ) tablet, 25 mg, Oral, Daily  magnesium  hydroxide (MILK OF MAGNESIA) 400mg  per 5mL oral liquid, 15 mL, Oral, 4x/day PRN  metoprolol  tartrate (LOPRESSOR ) tablet, 25 mg, Oral, 2x/day  multivitamin-minerals-iron  oral liquid, 15 mL, Oral, Daily  NS flush syringe, 3 mL, Intracatheter, Q8HRS  NS flush syringe, 3  mL, Intracatheter, Q1H PRN  OLANZapine  (zyPREXA ) tablet, 5 mg, Oral, NIGHTLY  omega-3 fatty acids (LOVAZA ) capsule, 1 g, Oral, Daily  ondansetron  (ZOFRAN ) 2 mg/mL injection, 4 mg, Intravenous, Q6H PRN  pantoprazole  (PROTONIX ) delayed release tablet, 40 mg, Oral, Daily Before Lunch  polyethylene glycol (MIRALAX) oral packet, 17 g, Oral, Daily  prochlorperazine  (COMPAZINE ) tablet, 10 mg, Oral, 3x/day PRN  traZODone  (DESYREL ) tablet, 150 mg, Oral, NIGHTLY        Physical Exam:  General: No acute distress  Cardiac: Regular rate and rhythm, no murmur auscultated.  Respiratory:  Clear to auscultation bilaterally , no wheeze, rhonchi  Abdomen:soft, right mid abdomen laparoscopic incisions covered with gauze which is  stained with fresh blood, expected minimal tenderness present in right upper quadrant, no organomegaly  Neurological: Alert and Orient X 3; No gross focal neurological deficits  Extremities: No peripheral edema noted     Results for orders placed or performed during the hospital encounter of 07/08/24 (from the past 24 hours)   CBC    Collection Time: 07/12/24  9:38 PM   Result Value Ref Range    WBC 8.4 3.7 - 11.0 x10^3/uL    RBC 3.03 (L) 3.85 - 5.22 x10^6/uL    HGB 8.5 (L) 11.5 - 16.0 g/dL    HCT 73.5 (L) 65.1 - 46.0 %    MCV 87.1 78.0 - 100.0 fL    MCH 28.1 26.0 - 32.0 pg    MCHC 32.2 31.0 - 35.5 g/dL    RDW-CV 84.4 88.4 - 84.4 %    PLATELETS 41 (L) 150 - 400 x10^3/uL    MPV 10.7 8.7 - 12.5 fL   PRODUCT: FFP/PLASMA - UNITS : 2 Units    Collection Time: 07/12/24 10:15 PM   Result Value Ref Range    Coding System ISBT128     UNIT NUMBER [id number]     BLOOD COMPONENT TYPE THAWED PLASMA     UNIT DIVISION 00     UNIT DISPENSE STATUS ISSUED     TRANSFUSION STATUS OK TO TRANSFUSE     Product Code E5549V00     Coding System ISBT128     UNIT NUMBER [id number]     BLOOD COMPONENT TYPE THAWED PLASMA     UNIT DIVISION 00     UNIT DISPENSE STATUS ISSUED,FINAL     TRANSFUSION STATUS OK TO TRANSFUSE     Product Code Z4451C99    PRODUCT: PLATELETS - UNITS , 2 Units    Collection Time: 07/12/24 10:15 PM   Result Value Ref Range    Coding System ISBT128     UNIT NUMBER [id number]     BLOOD COMPONENT TYPE PATHOGEN REDUCED PLASMA REDUCED1     UNIT DIVISION 00     UNIT DISPENSE STATUS ISSUED     TRANSFUSION STATUS OK TO TRANSFUSE     Product Code Z1658C99    PT/INR    Collection Time: 07/13/24  2:08 AM   Result Value Ref Range    PROTHROMBIN TIME 16.4 (H) 12.1 - 15.3 seconds    INR 1.29 (H) 0.86 - 1.14    Narrative    In the setting of warfarin therapy, a moderate-intensity INR goal  range is 2.0 to 3.0 and a high-intensity INR goal range is 2.5 to 3.5.    INR is ONLY validated to determine the level of anticoagulation with vitamin K antagonists (warfarin). Other factors may elevate the INR including but not limited to direct oral anticoagulants (DOACs), liver dysfunction,  vitamin K deficiency, DIC, factor deficiencies, and factor inhibitors.   PTT (PARTIAL THROMBOPLASTIN TIME) - AM ONCE    Collection Time: 07/13/24  2:08 AM   Result Value Ref Range    APTT 20.4 (L) 22.4 - 34.8 seconds   CBC/DIFF    Collection Time: 07/13/24  2:08 AM    Narrative    The following orders were created for panel order CBC/DIFF.  Procedure                               Abnormality         Status                     ---------                               -----------         ------                     CBC WITH IPQQ[[id number]]                Abnormal            Final result               PATH COMMENT[[id number]]                                                                MANUAL DIFF AND MORPHOLO.SABRASABRA[[id number]]  Abnormal            Final result                 Please view results for these tests on the individual orders.   COMPREHENSIVE METABOLIC PANEL, NON-FASTING    Collection Time: 07/13/24  2:08 AM   Result Value Ref Range    SODIUM 142 133 - 144 mmol/L    POTASSIUM 3.6 3.2 - 5.0 mmol/L    CHLORIDE 104 96 - 106 mmol/L    CO2 TOTAL 21 (L) 22 - 30 mmol/L    ANION GAP 17 7 - 18 mmol/L    BUN 50 (H) 8 - 23 mg/dL    CREATININE 8.74 (H) 0.50 - 0.90 mg/dL    ESTIMATED GFR 47 (L) >90 mL/min/1.70m^2    ALBUMIN 2.9 (L) 3.5 - 5.2 g/dL    CALCIUM 9.0 8.3 - 89.2 mg/dL    GLUCOSE 865 (H) 74 - 109 mg/dL    ALKALINE PHOSPHATASE 378 (H) 35 - 129 U/L    ALT (SGPT) 899 (H) 0 - 33 U/L    AST (SGOT) 1,898 (H) 0 - 32 U/L    BILIRUBIN TOTAL 1.6 (H) 0.2 - 1.2 mg/dL    PROTEIN TOTAL 5.7 (L) 6.4 - 8.3 g/dL   MAGNESIUM     Collection Time: 07/13/24  2:08 AM   Result Value Ref Range    MAGNESIUM  2.3 1.6 - 2.4 mg/dL   CBC WITH DIFF     Collection Time: 07/13/24  2:08 AM   Result Value Ref Range    WBC 7.6 3.7 - 11.0 x10^3/uL    RBC 2.92 (L) 3.85 - 5.22 x10^6/uL  HGB 8.1 (L) 11.5 - 16.0 g/dL    HCT 74.1 (L) 65.1 - 46.0 %    MCV 88.4 78.0 - 100.0 fL    MCH 27.7 26.0 - 32.0 pg    MCHC 31.4 31.0 - 35.5 g/dL    RDW-CV 84.2 (H) 88.4 - 15.5 %    PLATELETS 40 (L) 150 - 400 x10^3/uL    MPV 11.2 8.7 - 12.5 fL   MANUAL DIFF AND MORPHOLOGY-SYSMEX    Collection Time: 07/13/24  2:08 AM   Result Value Ref Range    NEUTROPHIL % 66 %    LYMPHOCYTE %  25 %    MONOCYTE % 1 %    EOSINOPHIL % 0 %    BASOPHIL % 0 %    NEUTROPHIL BANDS % 6   %    PROMYELOCYTE % MANUAL 1 (H) <=0 %    REACTIVE LYMPHOCYTE % 1 %    NEUTROPHIL # 5.47 1.50 - 7.70 x10^3/uL    LYMPHOCYTE # 1.98 1.00 - 4.80 x10^3/uL    MONOCYTE # <0.10 (L) 0.20 - 1.10 x10^3/uL    EOSINOPHIL # <0.10 <=0.50 x10^3/uL    BASOPHIL # <0.10 <=0.20 x10^3/uL    NRBC FROM MANUAL DIFF 17 (H) <=1 per 100 WBC    ACANTHOCYTES (SPUR CELL) 2+/Moderate (A) None    ANISOCYTOSIS 2+/Moderate (A) None   PT/INR    Collection Time: 07/13/24  8:35 AM   Result Value Ref Range    PROTHROMBIN TIME 16.8 (H) 12.1 - 15.3 seconds    INR 1.33 (H) 0.86 - 1.14    Narrative    In the setting of warfarin therapy, a moderate-intensity INR goal range is 2.0 to 3.0 and a high-intensity INR goal range is 2.5 to 3.5.    INR is ONLY validated to determine the level of anticoagulation with vitamin K antagonists (warfarin). Other factors may elevate the INR including but not limited to direct oral anticoagulants (DOACs), liver dysfunction, vitamin K deficiency, DIC, factor deficiencies, and factor inhibitors.   LACTIC ACID LEVEL W/ REFLEX FOR LEVEL >2.0    Collection Time: 07/13/24  8:35 AM   Result Value Ref Range    LACTIC ACID 2.8 (H) 0.5 - 2.0 mmol/L   CBC/DIFF    Collection Time: 07/13/24  8:44 AM    Narrative    The following orders were created for panel order CBC/DIFF.  Procedure                               Abnormality         Status                      ---------                               -----------         ------                     CBC WITH IPQQ[[id number]]                Abnormal            Final result               MANUAL DIFF AND MORPHOLO.SABRASABRA[[id number]]  Abnormal            Final  result                 Please view results for these tests on the individual orders.   CBC WITH DIFF    Collection Time: 07/13/24  8:44 AM   Result Value Ref Range    WBC 8.2 3.7 - 11.0 x10^3/uL    RBC 2.84 (L) 3.85 - 5.22 x10^6/uL    HGB 7.7 (L) 11.5 - 16.0 g/dL    HCT 75.6 (L) 65.1 - 46.0 %    MCV 85.6 78.0 - 100.0 fL    MCH 27.1 26.0 - 32.0 pg    MCHC 31.7 31.0 - 35.5 g/dL    RDW-CV 84.1 (H) 88.4 - 15.5 %    PLATELETS 56 (L) 150 - 400 x10^3/uL    MPV 10.4 8.7 - 12.5 fL   MANUAL DIFF AND MORPHOLOGY-SYSMEX    Collection Time: 07/13/24  8:44 AM   Result Value Ref Range    NEUTROPHIL % 73 %    LYMPHOCYTE %  20 %    MONOCYTE % 2 %    EOSINOPHIL % 1 %    BASOPHIL % 0 %    NEUTROPHIL BANDS % 2   %    METAMYELOCYTE %  2   %    NEUTROPHIL # 6.15 1.50 - 7.70 x10^3/uL    LYMPHOCYTE # 1.64 1.00 - 4.80 x10^3/uL    MONOCYTE # 0.16 (L) 0.20 - 1.10 x10^3/uL    EOSINOPHIL # <0.10 <=0.50 x10^3/uL    BASOPHIL # <0.10 <=0.20 x10^3/uL    NRBC FROM MANUAL DIFF 15 (H) <=1 per 100 WBC   COMPREHENSIVE METABOLIC PANEL, NON-FASTING    Collection Time: 07/13/24  8:45 AM   Result Value Ref Range    SODIUM 145 (H) 133 - 144 mmol/L    POTASSIUM 3.4 3.2 - 5.0 mmol/L    CHLORIDE 104 96 - 106 mmol/L    CO2 TOTAL 23 22 - 30 mmol/L    ANION GAP 18 7 - 18 mmol/L    BUN 49 (H) 8 - 23 mg/dL    CREATININE 8.89 (H) 0.50 - 0.90 mg/dL    ESTIMATED GFR 55 (L) >90 mL/min/1.80m^2    ALBUMIN 3.1 (L) 3.5 - 5.2 g/dL    CALCIUM 9.0 8.3 - 89.2 mg/dL    GLUCOSE 850 (H) 74 - 109 mg/dL    ALKALINE PHOSPHATASE 385 (H) 35 - 129 U/L    ALT (SGPT) 792 (H) 0 - 33 U/L    AST (SGOT) 1,527 (H) 0 - 32 U/L    BILIRUBIN TOTAL 1.8 (H) 0.2 - 1.2 mg/dL    PROTEIN TOTAL 5.8 (L) 6.4 - 8.3 g/dL   MAGNESIUM  - ONCE    Collection Time:  07/13/24  8:45 AM   Result Value Ref Range    MAGNESIUM  2.0 1.6 - 2.4 mg/dL   LACTIC ACID - FIRST REFLEX    Collection Time: 07/13/24 10:36 AM   Result Value Ref Range    LACTIC ACID 4.4 (HH) 0.5 - 2.0 mmol/L   LACTIC ACID - SECOND REFLEX    Collection Time: 07/13/24 12:58 PM   Result Value Ref Range    LACTIC ACID 3.8 (H) 0.5 - 2.0 mmol/L         Micro:   Hospital Encounter on 07/08/24 (from the past 96 hours)   RESPIRATORY CULTURE AND GRAM STAIN, AEROBIC    Collection Time: 07/10/24  1:10 PM    Specimen: Sputum; Other  Narrative    The following orders were created for panel order RESPIRATORY CULTURE AND GRAM STAIN, AEROBIC.  Procedure                               Abnormality         Status                     ---------                               -----------         ------                     SPUTUM [id number]]                                    Final result                 Please view results for these tests on the individual orders.   RESPIRATORY CULTURE AND GRAM STAIN (PERFORMABLE)    Collection Time: 07/10/24  1:10 PM    Specimen: Sputum   Culture Result Status    RESPIRATORY CULTURE Light Growth Candida albicans (A) Final    GRAM STAIN 1+ Rare Epithelial Cells Final    GRAM STAIN 1+ Rare WBCs Final    GRAM STAIN 2+ Few Yeast Final   STREP PNEUMONIAE AND LEGIONELLA ANTIGEN, URINE    Collection Time: 07/10/24  1:15 PM    Specimen: Urine, Site not specified   Culture Result Status    S.PNEUMONIAE ANTIGEN Negative Final    LEGIONELLA ANTIGEN Negative Final       Radiology:    Results for orders placed or performed during the hospital encounter of 07/08/24 (from the past 24 hours)   CT ABDOMEN PELVIS WO IV CONTRAST     Status: None    Narrative    Araina JEAN Blaney    CT ABDOMEN PELVIS WO IV CONTRAST performed on 07/12/2024 5:00 PM.    INDICATION:  bleeding from laparoscopy incision, significant drop in hemoglobin and hypotensive, to rule out intraperitoneal/retroperitoneal bleeding   Additional History:   bleeding at incision site and abnormal labs; hx of tubal    TECHNIQUE:  Unenhanced CT abdomen and pelvis with axial, coronal, and sagittal multiplanar reformations. Dose modulation, automated exposure control, and/or iterative reconstruction were used for dose reduction.    COMPARISON:  Contrast-enhanced CT dated 07/08/2024  ___________________________________  FINDINGS:    LOWER THORAX: Sternal wires. No lung base infiltrates. Normal heart size. Calcified coronary arteries. Small sliding hiatal hernia.    Chronic and degenerative bony changes. Scoliosis.    Evaluation of abdominal viscera is suboptimal due to noncontrast technique.    HEPATOBILIARY:      Hepatomegaly. Patient reportedly has metastatic liver disease which is not well seen on noncontrast CT.     Reportedly interval cholecystectomy. No surgical clips identified however. Clinical significance uncertain.    There is mixed hyperdense and hypodense material/fluid in the gallbladder fossa. This is thought to represent hematoma within the gallbladder fossa with fluid tracking inferiorly. It is primarily situated anterior to the hepatic flexure of the colon. Best demonstrated on axial series 3 image [id number]. Also sagittal series 6 image 72. Measures craniocaudal up to  10 cm on sagittal series 6 image 72.. Transaxial up to about 3.5 x 3 cm. There is some fluid superior to the left hepatic lobe between the liver and the anterior abdominal wall and central diaphragm. This is presumably related to blood. There is moderate volume of free fluid in the pelvis. This is dependently hyperdense and compatible with hemoperitoneum. There is a small amount of fluid in the bilateral paracolic gutters. There is a small amount of perisplenic fluid. There is a small hiatal hernia with some fluid tracking into the posterior mediastinum at this level.    PANCREAS: Pancreas grossly unremarkable.  SPLEEN: No splenomegaly is seen.  ADRENALS: No adrenal nodules are seen.  KIDNEYS:  Multiple bilateral intrarenal nonobstructing calculi. Small left renal probable cysts for which no further workup is needed. No hydronephrosis or asymmetric perinephric fat stranding.  LYMPH NODES: No retroperitoneal lymphadenopathy is seen.  VESSELS: Abdominal aorta is normal in caliber.  GI TRACT: Colonic diverticulosis. No obvious changes to suggest acute diverticulitis though the sigmoid colon and rectum are partially obscured by blood. No dilated small bowel loops. No bowel obstruction or ileus. Normal appendix is identified.  PELVIS:  There are tubal ligation clips. No urinary bladder distention.    There is dressing material associated with what may represent a laparoscopy port in the right upper mid abdomen. There are some hyperdense material extending from the rectus muscle to the skin which could represent blood. There is some gas bubbles at the level of the umbilicus, extraperitoneal which may represent postsurgical change.    No free air or bowel obstruction.  ___________________________________    Impression    1. Hematoma associated with the gallbladder fossa with fluid tracking inferiorly.     2. Moderate volume hemoperitoneum, more so blood lying in the dependent pelvis, though also around the liver and spleen and pericolic gutters.    3. Other findings as described above.      Radiologist location ID: [id number]     CT ABDOMEN PELVIS WO IV CONTRAST     Status: None    Narrative    Billijo JEAN Stewart    CT ABDOMEN PELVIS WO IV CONTRAST performed on 07/13/2024 8:53 AM.    INDICATION:  abdomen more distended and tender   Additional History:  Abdominal pain    TECHNIQUE:  Unenhanced CT abdomen and pelvis with axial, coronal, and sagittal multiplanar reformations. Dose modulation, automated exposure control, and/or iterative reconstruction were used for dose reduction.    COMPARISON:  CT of the abdomen and pelvis 07/12/2024  ___________________________________  FINDINGS:    The lower chest shows coronary  artery calcifications and presumed postsurgical changes of CABG.    The liver is enlarged. Mild periportal edema is evident. A hematoma in the gallbladder fossa is grossly similar compared to the prior study with a small amount of hemoperitoneum tracking inferiorly. The hemoperitoneum itself is slightly decreased in volume. There is a small amount of perihepatic fluid similar compared to prior study. Hemoperitoneum deeper in the pelvis remains moderate slightly decreased in overall volume.    The adrenal glands and pancreas are unremarkable. The spleen is enlarged. Bilateral nephrolithiasis is evident. The bladder is distended. The uterus is grossly normal. Bilateral tubal ligation clips are evident. Colonic diverticulosis is evident. No acute abnormality of the bowel is seen.    Atherosclerosis is seen throughout the aorta and its branches without aneurysm.    Postsurgical changes are seen throughout the anterior abdominal wall with a few small  subcutaneous hematomas, grossly similar compared to the prior study. No acute skeletal abnormality is seen. ___________________________________    Impression    1. Grossly stable hematoma in the gallbladder fossa with slight decrease in volume of hemoperitoneum.  2. Additional stable findings as above.        Radiologist location ID: [id number]           Assessment/ Plan:   Assessment & Plan  Acute hypoxic respiratory failure (CMS HCC)  Pneumonia due to COVID-19 virus  Likely due to COVID and possible superimposed bacterial pneumonia  Currently on 2 L nasal cannula  Respiratory panel positive for COVID  CT angiogram did not show any evidence pulmonary embolism although marginal increase in the size of infrahilar mass, right hilar and mediastinal lymphadenopathy.  Continue IV Decadron    Continue empiric IV Rocephin  Continue Brovana and Pulmicort   Pulmonary is following, appreciate input    Acute cholecystitis  Thickening of wall of gallbladder with pericholecystic  fluid  S/p laparoscopic cholecystectomy on 10/02  Has developed gallbladder fossa hematoma and hemoperitoneum  Tolerating regular diet   Appreciate General surgery input     Acute blood loss anemia  Acute blood loss anemia or acute on chronic anemia, and mild bleeding from 1 of the laparoscopic incisions  Likely secondary to acute bleeding and underlying metastatic disease  S/p vitamin K 10 mg IV x1 dose on 10/03,   S/p 2 units PRBC transfusion yesterday, hemoglobin this morning 8.2 trended down to 7.7   She also received 2 units platelets and 2 units FFP last night   CT abdomen done yesterday evening showed hematoma associated with the gallbladder fossa with fluid tracking inferiorly, moderate volume hemoperitoneum  CTA abdomen repeated this morning due to patient complaining of worsening abdomen pain and distention, showed stable hematoma in gallbladder fossa with slight decrease in volume of hemoperitoneum   Anemia panel not suggestive of iron  deficiency  Stool occult blood   Monitor CBC and transfuse for hemoglobin less than 7  Will monitor H and H q.12 hours for 24 hours  General surgery and Hematology are following, appreciate input   Ventricular tachycardia (CMS HCC)  Patient developed nonsustained V-tach last night   On amiodarone  drip   Evaluated by Cardiology, recommended to continue amiodarone  drip and patient is not a candidate for anticoagulation due to anemia and thrombocytopenia  Appreciate cardiology input  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)  Malignant neoplasm of lung, unspecified laterality, unspecified part of lung (CMS HCC)  Known history of small-cell carcinoma, planned for outpatient chemotherapy with carboplatin/etoposide/tecenteriq  Oncology is following, recommended to withhold current chemotherapy regimen until acute illness resolves     CAD (coronary artery disease)  History of CABG x1   Continue aspirin , atorvastatin , metoprolol     Hypertension  Pressure  control  Continue losartan , metoprolol   Paroxysmal atrial fibrillation (CMS HCC)  Rate controlled, continue metoprolol , not on anticoagulation  History of breast cancer  S/p mastectomy  Metastatic cancer to liver  Worsening metastatic liver disease likely causing elevated transaminases  Transaminitis  Likely secondary to liver metastasis  Pulmonary HTN (CMS HCC)  Pulmonary is following  Mood disorder (CMS HCC)  Continue trazodone   Cigarette smoker       Deep vein thrombosis prophylaxis-SCDs due to anemia      Candyce Salles, MD

## 2024-07-13 NOTE — Assessment & Plan Note (Signed)
 Pressure control  Continue losartan , metoprolol 

## 2024-07-13 NOTE — Assessment & Plan Note (Signed)
 Likely secondary to liver metastasis

## 2024-07-13 NOTE — Assessment & Plan Note (Signed)
 S/p laparoscopic cholecystectomy on 10/02  Has developed gallbladder fossa hematoma and hemoperitoneum  Tolerating regular diet   Appreciate General surgery input

## 2024-07-13 NOTE — Assessment & Plan Note (Signed)
 History of CABG x1   Continue aspirin , atorvastatin , metoprolol 

## 2024-07-13 NOTE — Consults (Signed)
 [facility]  Cardiology   Consult    Rachel, [name], 68 y.o. female  Encounter Start Date:  07/08/2024  Inpatient Admission Date: 07/08/2024   Date of Service: 07/13/2024  Admission Source: Home  Date of Birth:  01/29/1956  PCP:  Garvin Lemmings, FNP    Information Obtained from: patient and history reviewed via medical record  Chief Complaint:  Shortness of Breath (Sent from cancer center for SpO2 88%. Pt has CP, nausea. Hx small cell lung.), Chest Pain , and Nausea        HPI:  [name] is a 68 y.o. 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    Consulted for Dr.Goad  Medical History:This is a 68 year old patient who has a history of coronary artery bypass surgery/stents, atrial fibrillation, hypertension, hyperlipidemia, small-cell carcinoma with a metastasis to the liver who came in for multiple complaints.  This has small-cell carcinoma is in the right lung.  The patient had elevated liver enzymes.  She was found to have hepatomegaly.  She has been having significant shortness of breath for a while.  She is complaining of abdominal pain with distention after surgery yesterday.  In addition her hemoglobin and platelet counts were running low.  Her BUN is fifty creatinine 1.25 and the acute renal failure appears to be new.  The patient has a history of nonsustained ventricular tachycardia comprising 3-4 beats at a time.  The patient had laparoscopic cholecystectomy yesterday.  Past Medical History:   Diagnosis Date    Abdominal pain     Cancer (CMS HCC)     Chest pain     Chronic lung disease     Coronary artery disease     Dyslipidemia     Essential hypertension     Heart disease     History of percutaneous coronary intervention     History of stress test     Mixed hyperlipidemia     Unspecified chronic bronchitis          Surgical History:  Past Surgical History:   Procedure Laterality Date    CARDIAC CATHETERIZATION       CORONARY ANGIOPLASTY      CORONARY ARTERY STENT PLACEMENT      HX BACK SURGERY      HX CORONARY ARTERY BYPASS GRAFT      HX MASTECTOMY, SIMPLE Bilateral     HX TUBAL LIGATION      NECK SURGERY       Social History:  Social History     Socioeconomic History    Marital status: Widowed     Spouse name: Not on file    Number of children: Not on file    Years of education: Not on file    Highest education level: Not on file   Occupational History    Not on file   Tobacco Use    Smoking status: Every Day     Current packs/day: 0.50     Average packs/day: 0.5 packs/day for 52.8 years (26.4 ttl pk-yrs)     Types: Cigarettes     Start date: 1973    Smokeless tobacco: Never   Vaping Use    Vaping status: Never Used   Substance and Sexual Activity    Alcohol use: Yes     Comment: occasional    Drug use: Yes     Types: Marijuana    Sexual activity: Not on file  Other Topics Concern    Not on file   Social History Narrative    Not on file     Social Determinants of Health     Financial Resource Strain: Not on file   Transportation Needs: Not on file   Social Connections: Low Risk (07/09/2024)    Social Connections     SDOH Social Isolation: 5 or more times a week   Intimate Partner Violence: Not on file   Housing Stability: Not on file        Prior to Admission Medications:  Medications Prior to Admission       Prescriptions    anastrozole  (ARIMIDEX ) 1 mg Oral Tablet    Take 1 Tablet (1 mg total) by mouth Daily    aspirin  81 mg Oral Tablet, Chewable    Chew 1 Tablet (81 mg total) Once a day    atorvastatin  (LIPITOR) 40 mg Oral Tablet    Take 1 Tablet (40 mg total) by mouth Daily    cholecalciferol , vitamin D3, 25 mcg (1,000 unit) Oral Tablet    Take 1 Tablet (1,000 Units total) by mouth Daily    clonazePAM  (KLONOPIN ) 0.5 mg Oral Tablet    Take 1 Tablet (0.5 mg total) by mouth Twice per day as needed for Other (ANXIETY)    DULoxetine  (CYMBALTA  DR) 60 mg Oral Capsule, Delayed Release(E.C.)    Take 1 Capsule (60 mg total) by mouth  Daily    fluticasone  propion-salmeteroL (ADVAIR ) 250-50 mcg/dose Inhalation oral diskus inhaler    Take 1 Puff (1 Inhalation total) by inhalation Twice daily    fluticasone  propionate (FLONASE ) 50 mcg/actuation Nasal Spray, Suspension    1 Spray Once per day as needed for Other (CONGESTION)    gabapentin  (NEURONTIN ) 400 mg Oral Capsule    Take 1 Capsule (400 mg total) by mouth Three times a day    losartan  (COZAAR ) 25 mg Oral Tablet    Take 1 Tablet (25 mg total) by mouth Daily    metoprolol  tartrate (LOPRESSOR ) 25 mg Oral Tablet    Take 1 Tablet (25 mg total) by mouth Twice daily    OLANZapine  (ZYPREXA ) 5 mg Oral Tablet    Take 1 Tablet (5 mg total) by mouth Every night for 3 days    oxyCODONE -acetaminophen  (PERCOCET) 7.5-325 mg Oral Tablet    Take 1 Tablet by mouth Every 4 hours as needed for Pain    prochlorperazine  (COMPAZINE ) 10 mg Oral Tablet    Take 1 Tablet (10 mg total) by mouth Three times a day as needed for Nausea/Vomiting for up to 30 days    promethazine  (PHENERGAN ) 25 mg Oral Tablet    Take 1 Tablet (25 mg total) by mouth Every 8 hours as needed for Nausea/Vomiting    traZODone  (DESYREL ) 150 mg Oral Tablet    Take 1 Tablet (150 mg total) by mouth Every night          Allergies[1]      Family History:  Family History       Problem Relation Age of Onset Comments    Arthritis Maternal Grandmother      Arthritis Mother (Deceased)      Cancer Father (Deceased)      Cancer Other      Heart Attack Father (Deceased)      Heart Attack Other      Heart Disease Brother (Alive)      Heart Disease Other      Hypertension (High Blood Pressure)  Father (Deceased)      Hypertension (High Blood Pressure) Mother (Deceased)      Hypertension (High Blood Pressure) Other      Pacemaker Mother (Deceased)               ROS: Other than ROS in the HPI, all other systems were negative.     Exam:  Temperature: 37.2 C (99 F)  Heart Rate: 96  BP (Non-Invasive): 118/67  Respiratory Rate: 16  SpO2: 94 %  General:  The patient is  in distress.  Complains of abdominal pain and swelling.  Also has significant shortness of breath.    HEENT:Head normocephalic, atraumatic. ENT without erythema or injection, mucouse membranes moist.    Neck: No JVD, no carotid bruit. and supple, symmetrical, trachea midline.   Lungs:  Extensive rhonchi bilaterally.  Cardiovascular:  Irregular 1st heart sound there is no murmur or gallop.  Abdomen: Soft, non-tender and bowel sounds normal.    Extremities: Extremities normal, atraumatic, no cyanosis or edema.    Skin: Skin warm and dry.    Neurologic: Alert and oriented x3.  Psych: Mood and affect congruent for age and gender     Labs:  Lab Results Today:    Results for orders placed or performed during the hospital encounter of 07/08/24 (from the past 24 hours)   BASIC METABOLIC PANEL   Result Value Ref Range    SODIUM 144 133 - 144 mmol/L    POTASSIUM 4.2 3.2 - 5.0 mmol/L    CHLORIDE 105 96 - 106 mmol/L    CO2 TOTAL 19 (L) 22 - 30 mmol/L    ANION GAP 20 (H) 7 - 18 mmol/L    CALCIUM 9.1 8.3 - 10.7 mg/dL    GLUCOSE 859 (H) 74 - 109 mg/dL    BUN 35 (H) 8 - 23 mg/dL    CREATININE 9.15 9.49 - 0.90 mg/dL    BUN/CREA RATIO 42     ESTIMATED GFR 76 (L) >90 mL/min/1.58m^2   CBC   Result Value Ref Range    WBC 9.8 3.7 - 11.0 x10^3/uL    RBC 2.24 (L) 3.85 - 5.22 x10^6/uL    HGB 6.2 (LL) 11.5 - 16.0 g/dL    HCT 78.6 (L) 65.1 - 46.0 %    MCV 95.1 78.0 - 100.0 fL    MCH 28.1 26.0 - 32.0 pg    MCHC 29.6 (L) 31.0 - 35.5 g/dL    RDW-CV 84.0 (H) 88.4 - 15.5 %    PLATELETS 45 (L) 150 - 400 x10^3/uL    MPV 9.9 8.7 - 12.5 fL   TYPE AND SCREEN   Result Value Ref Range    UNITS ORDERED 2     ABO/RH(D) O POSITIVE     ANTIBODY SCREEN NEGATIVE     SPECIMEN EXPIRATION DATE 07/15/2024,2359    CROSSMATCH RED CELLS - UNITS , 2 Units   Result Value Ref Range    Coding System [id number]     UNIT NUMBER [id number]     BLOOD COMPONENT TYPE LR RBC Adsol3, 04741     UNIT DIVISION 00     UNIT DISPENSE STATUS ISSUED,FINAL     TRANSFUSION STATUS OK TO  TRANSFUSE     IS CROSSMATCH Electronically Compatible     Product Code Z9314C99     Coding System ISBT128     UNIT NUMBER [id number]     BLOOD COMPONENT TYPE LR RBC, Adsol1, 04710     UNIT DIVISION 00  UNIT DISPENSE STATUS ISSUED,FINAL     TRANSFUSION STATUS OK TO TRANSFUSE     IS CROSSMATCH Electronically Compatible     Product Code Z9663C99    CBC   Result Value Ref Range    WBC 8.4 3.7 - 11.0 x10^3/uL    RBC 3.03 (L) 3.85 - 5.22 x10^6/uL    HGB 8.5 (L) 11.5 - 16.0 g/dL    HCT 73.5 (L) 65.1 - 46.0 %    MCV 87.1 78.0 - 100.0 fL    MCH 28.1 26.0 - 32.0 pg    MCHC 32.2 31.0 - 35.5 g/dL    RDW-CV 84.4 88.4 - 84.4 %    PLATELETS 41 (L) 150 - 400 x10^3/uL    MPV 10.7 8.7 - 12.5 fL   PRODUCT: FFP/PLASMA - UNITS : 2 Units   Result Value Ref Range    Coding System ISBT128     UNIT NUMBER [id number]     BLOOD COMPONENT TYPE THAWED PLASMA     UNIT DIVISION 00     UNIT DISPENSE STATUS ISSUED     TRANSFUSION STATUS OK TO TRANSFUSE     Product Code E5549V00     Coding System ISBT128     UNIT NUMBER [id number]     BLOOD COMPONENT TYPE THAWED PLASMA     UNIT DIVISION 00     UNIT DISPENSE STATUS ISSUED,FINAL     TRANSFUSION STATUS OK TO TRANSFUSE     Product Code Z4451C99    PRODUCT: PLATELETS - UNITS , 2 Units   Result Value Ref Range    Coding System ISBT128     UNIT NUMBER [id number]     BLOOD COMPONENT TYPE PATHOGEN REDUCED PLASMA REDUCED1     UNIT DIVISION 00     UNIT DISPENSE STATUS ISSUED     TRANSFUSION STATUS OK TO TRANSFUSE     Product Code [id number]    PT/INR   Result Value Ref Range    PROTHROMBIN TIME 16.4 (H) 12.1 - 15.3 seconds    INR 1.29 (H) 0.86 - 1.14   PTT (PARTIAL THROMBOPLASTIN TIME) - AM ONCE   Result Value Ref Range    APTT 20.4 (L) 22.4 - 34.8 seconds   COMPREHENSIVE METABOLIC PANEL, NON-FASTING   Result Value Ref Range    SODIUM 142 133 - 144 mmol/L    POTASSIUM 3.6 3.2 - 5.0 mmol/L    CHLORIDE 104 96 - 106 mmol/L    CO2 TOTAL 21 (L) 22 - 30 mmol/L    ANION GAP 17 7 - 18 mmol/L    BUN 50 (H)  8 - 23 mg/dL    CREATININE 8.74 (H) 0.50 - 0.90 mg/dL    ESTIMATED GFR 47 (L) >90 mL/min/1.54m^2    ALBUMIN 2.9 (L) 3.5 - 5.2 g/dL    CALCIUM 9.0 8.3 - 89.2 mg/dL    GLUCOSE 865 (H) 74 - 109 mg/dL    ALKALINE PHOSPHATASE 378 (H) 35 - 129 U/L    ALT (SGPT) 899 (H) 0 - 33 U/L    AST (SGOT) 1,898 (H) 0 - 32 U/L    BILIRUBIN TOTAL 1.6 (H) 0.2 - 1.2 mg/dL    PROTEIN TOTAL 5.7 (L) 6.4 - 8.3 g/dL   MAGNESIUM    Result Value Ref Range    MAGNESIUM  2.3 1.6 - 2.4 mg/dL   CBC WITH DIFF   Result Value Ref Range    WBC 7.6 3.7 - 11.0 x10^3/uL    RBC 2.92 (L) 3.85 -  5.22 x10^6/uL    HGB 8.1 (L) 11.5 - 16.0 g/dL    HCT 74.1 (L) 65.1 - 46.0 %    MCV 88.4 78.0 - 100.0 fL    MCH 27.7 26.0 - 32.0 pg    MCHC 31.4 31.0 - 35.5 g/dL    RDW-CV 84.2 (H) 88.4 - 15.5 %    PLATELETS 40 (L) 150 - 400 x10^3/uL    MPV 11.2 8.7 - 12.5 fL   MANUAL DIFF AND MORPHOLOGY-SYSMEX   Result Value Ref Range    NEUTROPHIL % 66 %    LYMPHOCYTE %  25 %    MONOCYTE % 1 %    EOSINOPHIL % 0 %    BASOPHIL % 0 %    NEUTROPHIL BANDS % 6   %    PROMYELOCYTE % MANUAL 1 (H) <=0 %    REACTIVE LYMPHOCYTE % 1 %    NEUTROPHIL # 5.47 1.50 - 7.70 x10^3/uL    LYMPHOCYTE # 1.98 1.00 - 4.80 x10^3/uL    MONOCYTE # <0.10 (L) 0.20 - 1.10 x10^3/uL    EOSINOPHIL # <0.10 <=0.50 x10^3/uL    BASOPHIL # <0.10 <=0.20 x10^3/uL    NRBC FROM MANUAL DIFF 17 (H) <=1 per 100 WBC    ACANTHOCYTES (SPUR CELL) 2+/Moderate (A) None    ANISOCYTOSIS 2+/Moderate (A) None     BMP:     Recent Labs     07/12/24  0839 07/13/24  0208   SODIUM 144 142   POTASSIUM 4.2 3.6   CHLORIDE 105 104   CO2 19* 21*   BUN 35* 50*   CREATININE 0.84 1.25*   GLUCOSENF 140* 134*   ANIONGAP 20* 17   BUNCRRATIO 42  --    GFR 76* 47*   CALCIUM 9.1 9.0     CBC Results Differential Results   Recent Labs     07/13/24  0208   WBC 7.6   HGB 8.1*   HCT 25.8*   PLTCNT 40*    Recent Results (from the past 30 hours)   MANUAL DIFF AND MORPHOLOGY-SYSMEX    Collection Time: 07/13/24  2:08 AM   Result Value    NEUTROPHIL % 66    LYMPHOCYTE  %  25    MONOCYTE % 1    EOSINOPHIL % 0    BASOPHIL % 0    NEUTROPHIL BANDS % 6    PROMYELOCYTE % MANUAL 1 (H)    BASOPHIL # <0.10   CBC WITH DIFF    Collection Time: 07/13/24  2:08 AM   Result Value    WBC 7.6          Coags:    Recent Labs     07/13/24  0208   PROTHROMTME 16.4*   INR 1.29*   APTT 20.4*       Imaging Studies:          Prior cardiac testing/Imaging Studies:     LEFT HEART CATH  No results found for this or any previous visit.    STRESS TEST/MYOCARDIAL PERFUSION IMAGING  No results found for this or any previous visit.      Imaging     Recent Results (from the past 2400 hours)   TRANSTHORACIC ECHOCARDIOGRAM - ADULT   Result Value Ref Range    EF VISUAL ESTIMATE 55     EF 60         Previous CXR:   CT ABDOMEN PELVIS WO IV CONTRAST  Result Date:  07/12/2024  Impression 1. Hematoma associated with the gallbladder fossa with fluid tracking inferiorly. 2. Moderate volume hemoperitoneum, more so blood lying in the dependent pelvis, though also around the liver and spleen and pericolic gutters. 3. Other findings as described above. Radiologist location ID: [id number]     MRI MRCP WO CONTRAST  Result Date: 07/10/2024  Impression * Mild extra hepatic bile duct dilatation as noted on CT 07/08/2024, no bile duct stone. * No gallstones, there is pericholecystic fluid as well as fluid adjacent to the liver. * Diffuse heterogeneous appearance to the liver as described above, this could certainly represent findings associated with acute hepatitis, differential considerations include regenerative nodularity in chronic liver disease, metastatic disease/lymphoma, and possibly sarcoid Radiologist location ID: [id number] Radiologist location ID: [id number]     US  RT UPPER QUADRANT  Result Date: 07/09/2024  Impression Gallbladder wall thickening with pericholecystic fluid consistent with acute cholecystitis. Dilated common bile duct without distal obstructing lesion identified. Hepatomegaly with hepatic steatosis. Trace  ascites. Radiologist location ID: [id number]     CT ANGIO CHEST FOR PULMONARY EMBOLUS W IV CONTRAST  Result Date: 07/08/2024  Impression 1. No evidence of pulmonary embolism. 2. Marginal increase in the size of the right infrahilar mass, right hilar and mediastinal lymphadenopathy compared to the prior study. 3. Other chronic findings as described above. Radiologist location ID: [id number]     CT ABDOMEN PELVIS W IV CONTRAST  Result Date: 07/08/2024  Impression 1. Hepatomegaly, hepatic steatosis and hepatic lesions, not greatly changed and new pericholecystic fluid now present. 2. Multiple nonobstructing bilateral renal calculi. 3. New free fluid in the pelvis. Radiologist location ID: [id number]     XR AP MOBILE CHEST  Result Date: 07/08/2024  Impression 1. Right hilar adenopathy. 2. Vague pulmonary opacities could be mild pneumonia or atelectasis. Radiologist location ID: [id number]     XR AP MOBILE CHEST  Result Date: 06/17/2024  Impression 1. No pneumothorax post bronchoscopy. 2. Right hilar mass. 3. Opacity right lung apex and right lung base, may be aspiration or atelectasis. Radiologist location ID: [id number]     CT CHEST ABDOMEN PELVIS W IV CONTRAST  Result Date: 06/14/2024  Impression Chest: 1. Right lower cervical, supraclavicular, mediastinal and right hilar adenopathy compatible with malignancy. 2. New expansile soft tissue thickening along the bronchovascular bundles of the right lower lobe, considered malignancy until otherwise proven. 3. Emphysema. Abdomen/pelvis: 1. New subcentimeter hypoechoic hepatic lesions, presumably metastatic. 2. Additional chronic and incidental findings as above. PET/CT may be of diagnostic benefit. Radiologist location ID: [id number]     CT BRAIN WO IV CONTRAST  Result Date: 06/14/2024  Impression No evidence of acute intracranial disease. Findings compatible with chronic small vessel ischemic changes. Note that in acute stroke, findings may not be visible on CT for the  first 48 hours. Radiologist location ID: [id number]     US  GALLBLADDER  Result Date: 06/14/2024  Impression Unremarkable gallbladder. Mild chronic common bile duct dilation. Radiologist location ID: [id number]     XR CHEST AP  Result Date: 06/14/2024  Impression Right hilar enlargement may represent mass, adenopathy or vascular enlargement. Enhanced CT chest would be the test of choice for further evaluation. A Significant Orange actionable finding has been sent via the [employer] Actionable Findings application on 06/14/2024 1:55 PM, Message ID [id number]. Receipt of this communication will be communicated to [facility] RADIOLOGY STAFF or responsible provider and will be documented in [employer] Actionable Findings System upon receiving the acknowledgement. Radiologist location ID: [id number]  Assessment:    Assessment/Plan:  Active Hospital Problems    Diagnosis    Primary Problem: Acute hypoxic respiratory failure (CMS HCC)    Acute cholecystitis    Thickening of wall of gallbladder with pericholecystic fluid    Symptomatic anemia    COVID-19    Acute blood loss anemia    Transaminitis    Malignant neoplasm of lung, unspecified laterality, unspecified part of lung (CMS HCC)    Pulmonary HTN (CMS HCC)    Pneumonia due to COVID-19 virus    Mood disorder (CMS HCC)    Anemia, unspecified type    Cigarette smoker    Metastatic cancer to liver    History of breast cancer    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)    Paroxysmal atrial fibrillation (CMS HCC)    Hypertension    CAD (coronary artery disease)   1. The patient has new onset of nonsustained ventricular tachycardia.  She is not a candidate for any cardiac intervention except treatment with amiodarone .  She is getting it intravenously now.  2. Drop in the hemoglobin and platelet values being followed by Hematology.  She does not appear to be a good candidate for anticoagulation therapy or distant.  3. Her prognosis appears to be poor based  on her general condition.        Evanny Ellerbe, MD 07/13/2024 08:03         [1] No Known Allergies

## 2024-07-13 NOTE — Assessment & Plan Note (Signed)
 Pulmonary is following.

## 2024-07-14 DIAGNOSIS — K8012 Calculus of gallbladder with acute and chronic cholecystitis without obstruction: Secondary | ICD-10-CM

## 2024-07-14 DIAGNOSIS — I4729 Other ventricular tachycardia: Secondary | ICD-10-CM

## 2024-07-14 LAB — PRODUCT: FFP/PLASMA - UNITS
UNIT DIVISION: 0
UNIT DIVISION: 0

## 2024-07-14 LAB — COMPREHENSIVE METABOLIC PNL, FASTING
ALBUMIN: 2.7 g/dL — ABNORMAL LOW (ref 3.5–5.2)
ALKALINE PHOSPHATASE: 393 U/L — ABNORMAL HIGH (ref 35–129)
ALT (SGPT): 573 U/L — ABNORMAL HIGH (ref 0–33)
ANION GAP: 16 mmol/L (ref 7–18)
AST (SGOT): 950 U/L — ABNORMAL HIGH (ref 0–32)
BILIRUBIN TOTAL: 2 mg/dL — ABNORMAL HIGH (ref 0.2–1.2)
BUN: 44 mg/dL — ABNORMAL HIGH (ref 8–23)
CALCIUM: 8.7 mg/dL (ref 8.3–10.7)
CHLORIDE: 106 mmol/L (ref 96–106)
CO2 TOTAL: 24 mmol/L (ref 22–30)
CREATININE: 1.04 mg/dL — ABNORMAL HIGH (ref 0.50–0.90)
ESTIMATED GFR: 59 mL/min/1.73mˆ2 — ABNORMAL LOW (ref >90)
GLUCOSE: 140 mg/dL — ABNORMAL HIGH (ref 74–109)
POTASSIUM: 3.2 mmol/L (ref 3.2–5.0)
PROTEIN TOTAL: 5.2 g/dL — ABNORMAL LOW (ref 6.4–8.3)
SODIUM: 146 mmol/L — ABNORMAL HIGH (ref 133–144)

## 2024-07-14 LAB — CBC
HCT: 22.6 % — ABNORMAL LOW (ref 34.8–46.0)
HGB: 7 g/dL — ABNORMAL LOW (ref 11.5–16.0)
MCH: 26.9 pg (ref 26.0–32.0)
MCHC: 31 g/dL (ref 31.0–35.5)
MCV: 86.9 fL (ref 78.0–100.0)
MPV: 10.5 fL (ref 8.7–12.5)
PLATELETS: 40 x10ˆ3/uL — ABNORMAL LOW (ref 150–400)
RBC: 2.6 x10ˆ6/uL — ABNORMAL LOW (ref 3.85–5.22)
RDW-CV: 16 % — ABNORMAL HIGH (ref 11.5–15.5)
WBC: 7.5 x10ˆ3/uL (ref 3.7–11.0)

## 2024-07-14 LAB — FIBRINOGEN: FIBRINOGEN: 459 mg/dL (ref 251–576)

## 2024-07-14 LAB — COMPREHENSIVE METABOLIC PANEL, NON-FASTING
ALBUMIN: 2.8 g/dL — ABNORMAL LOW (ref 3.5–5.2)
ALKALINE PHOSPHATASE: 412 U/L — ABNORMAL HIGH (ref 35–129)
ALT (SGPT): 499 U/L — ABNORMAL HIGH (ref 0–33)
ANION GAP: 19 mmol/L — ABNORMAL HIGH (ref 7–18)
AST (SGOT): 834 U/L — ABNORMAL HIGH (ref 0–32)
BILIRUBIN TOTAL: 2.6 mg/dL — ABNORMAL HIGH (ref 0.2–1.2)
BUN: 44 mg/dL — ABNORMAL HIGH (ref 8–23)
CALCIUM: 8.8 mg/dL (ref 8.3–10.7)
CHLORIDE: 104 mmol/L (ref 96–106)
CO2 TOTAL: 22 mmol/L (ref 22–30)
CREATININE: 0.99 mg/dL — ABNORMAL HIGH (ref 0.50–0.90)
ESTIMATED GFR: 62 mL/min/1.73mˆ2 — ABNORMAL LOW (ref >90)
GLUCOSE: 159 mg/dL — ABNORMAL HIGH (ref 74–109)
POTASSIUM: 3.1 mmol/L — ABNORMAL LOW (ref 3.2–5.0)
PROTEIN TOTAL: 5.3 g/dL — ABNORMAL LOW (ref 6.4–8.3)
SODIUM: 145 mmol/L — ABNORMAL HIGH (ref 133–144)

## 2024-07-14 LAB — PRODUCT: PLATELETS - UNITS: UNIT DIVISION: 0

## 2024-07-14 LAB — H & H
HCT: 21.3 % — ABNORMAL LOW (ref 34.8–46.0)
HCT: 27.7 % — ABNORMAL LOW (ref 34.8–46.0)
HGB: 6.7 g/dL — CL (ref 11.5–16.0)
HGB: 8.9 g/dL — ABNORMAL LOW (ref 11.5–16.0)

## 2024-07-14 LAB — PT/INR
INR: 1.42 — ABNORMAL HIGH (ref 0.86–1.14)
PROTHROMBIN TIME: 17.7 s — ABNORMAL HIGH (ref 12.1–15.3)

## 2024-07-14 LAB — THYROID STIMULATING HORMONE WITH FREE T4 REFLEX: TSH: 1.44 u[IU]/mL (ref 0.270–4.200)

## 2024-07-14 LAB — PTT (PARTIAL THROMBOPLASTIN TIME): APTT: 28.6 s (ref 22.4–34.8)

## 2024-07-14 MED ORDER — HYDROMORPHONE 0.5 MG/0.5 ML INJECTION SYRINGE
0.5000 mg | INJECTION | INTRAMUSCULAR | Status: DC | PRN
Start: 2024-07-14 — End: 2024-07-19
  Administered 2024-07-14 – 2024-07-16 (×2): 0.5 mg via INTRAVENOUS
  Filled 2024-07-14 (×2): qty 0.5

## 2024-07-14 MED ORDER — SODIUM CHLORIDE 0.9 % IV BOLUS
40.0000 mL | INJECTION | Freq: Once | Status: DC | PRN
Start: 2024-07-14 — End: 2024-07-15

## 2024-07-14 MED ORDER — AMIODARONE 200 MG TABLET
200.0000 mg | ORAL_TABLET | Freq: Every morning | ORAL | Status: DC
Start: 2024-07-15 — End: 2024-07-16
  Administered 2024-07-15 – 2024-07-16 (×2): 200 mg via ORAL
  Filled 2024-07-14 (×2): qty 1

## 2024-07-14 MED ORDER — METOPROLOL TARTRATE 5 MG/5 ML INTRAVENOUS SOLUTION
2.5000 mg | INTRAVENOUS | Status: AC
Start: 2024-07-14 — End: 2024-07-14
  Administered 2024-07-14: 2.5 mg via INTRAVENOUS
  Filled 2024-07-14: qty 5

## 2024-07-14 MED ORDER — SODIUM CHLORIDE 0.9 % IV BOLUS
40.0000 mL | INJECTION | Freq: Once | Status: AC | PRN
Start: 2024-07-14 — End: 2024-07-14

## 2024-07-14 MED ORDER — POTASSIUM CHLORIDE ER 20 MEQ TABLET,EXTENDED RELEASE(PART/CRYST)
20.0000 meq | ORAL_TABLET | Freq: Once | ORAL | Status: AC
Start: 2024-07-14 — End: 2024-07-14
  Administered 2024-07-14: 20 meq via ORAL
  Filled 2024-07-14: qty 1

## 2024-07-14 NOTE — Assessment & Plan Note (Signed)
 Patient developed nonsustained V-tach last night   S/p amiodarone  drip   Continue amiodarone   200 mg p.o. daily Evaluated by Cardiology, recommended to continue amiodarone  drip and patient is not a candidate for anticoagulation due to anemia and thrombocytopenia  Appreciate cardiology input

## 2024-07-14 NOTE — Assessment & Plan Note (Signed)
S/p mastectomy 

## 2024-07-14 NOTE — Progress Notes (Signed)
 [facility] Coverage Note          Name:  Maurie Musco MRN:  [id number]   Admission Date: 07/08/2024   DOB:  [birth year] (68 y.o.)   Date of service: 07/14/2024       Hemoglobin 6.7. 1u PRBC ordered.    Overnight Coverage Note:  Otherwise no change in plan of care.  Continue current medical management and supportive care.  Will defer further management decisions to primary team in the morning.    Mande Auvil Danielle Myquan Schaumburg, APRN, CNP  07/14/2024, 03:17

## 2024-07-14 NOTE — Assessment & Plan Note (Signed)
 S/p laparoscopic cholecystectomy on 10/02  Has developed gallbladder fossa hematoma and hemoperitoneum  Tolerating regular diet   Appreciate General surgery input

## 2024-07-14 NOTE — Assessment & Plan Note (Signed)
 Rate controlled, continue metoprolol , not on anticoagulation

## 2024-07-14 NOTE — Assessment & Plan Note (Signed)
 Likely due to COVID and possible superimposed bacterial pneumonia  Currently on 2 L nasal cannula  Respiratory panel positive for COVID  CT angiogram did not show any evidence pulmonary embolism although marginal increase in the size of infrahilar mass, right hilar and mediastinal lymphadenopathy.  Continue p.o. Decadron    IV Rocephin has been de-escalated to p.o. cefpodoxime  Continue Brovana and Pulmicort   Pulmonary is following, appreciate input

## 2024-07-14 NOTE — Assessment & Plan Note (Signed)
 Worsening metastatic liver disease likely causing elevated transaminases

## 2024-07-14 NOTE — Assessment & Plan Note (Signed)
 Pulmonary is following.

## 2024-07-14 NOTE — Consults (Signed)
 [facility]  Hematology/Oncology    Consult progress Note      [name], [name], 68 y.o. female  Date of Birth:  November 29, 1955  Inpatient Admission Date:  07/08/2024  Date of Service: 07/14/2024    Chief Complaint:  Known patient - history of small-cell lung CA with liver metastases    Interim HPI:  Resting in bed.  Hemoglobin dropped again to 6.7.  S/p PRBC transfusion.  HGB now 7.0.  Previously endorsed some abdominal discomfort.  CT abdomen/pelvis showing hemoperitoneum.  General surgery without further intervention at this time.  S/p cholecystectomy on 07/11/2024.  Platelet count has dropped again.    HPI:  This is a 68 year old female who presented outpatient infusion center today for Tecentriq/etoposide/carbo treatment when she reported worsening dyspnea to nursing staff.  She was found to be 89% on room air.  Was then placed on 2 L nasal cannula O2 which increased her to 94% oxygenation.  Also c/o worsening generalized pain that is worse in the legs as well as productive cough with yellow sputum.  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    Review of Systems:   Review of Systems   Constitutional:  Positive for fatigue. Negative for activity change, appetite change, chills, diaphoresis, fever and unexpected weight change.   HENT:  Negative for congestion, ear pain, facial swelling, hearing loss, mouth sores, nosebleeds, sinus pain, sore throat, tinnitus, trouble swallowing and voice change.    Eyes:  Negative for photophobia, pain and visual disturbance.   Respiratory:  Positive for cough and shortness of breath. Negative for apnea, choking, chest tightness, wheezing and stridor.    Cardiovascular:  Negative for chest pain, palpitations and leg swelling.   Gastrointestinal:  Negative for abdominal distention, abdominal pain, anal bleeding, blood in stool, constipation, diarrhea, nausea, rectal pain and vomiting.    Endocrine: Negative for cold intolerance and heat intolerance.   Genitourinary:  Negative for decreased urine volume, difficulty urinating, dysuria, flank pain, frequency, hematuria and urgency.   Musculoskeletal:  Positive for arthralgias and myalgias. Negative for back pain, gait problem, joint swelling and neck pain.   Skin:  Negative for color change, pallor, rash and wound.   Allergic/Immunologic: Negative for immunocompromised state.   Neurological:  Negative for dizziness, syncope, facial asymmetry, speech difficulty, weakness, light-headedness, numbness and headaches.   Hematological:  Negative for adenopathy. Does not bruise/bleed easily.   Psychiatric/Behavioral:  Negative for agitation, behavioral problems and confusion.      Past Medical History:   Diagnosis Date    Abdominal pain     Cancer (CMS HCC)     Chest pain     Chronic lung disease     Coronary artery disease     Dyslipidemia     Essential hypertension     Heart disease     History of percutaneous coronary intervention     History of stress test     Mixed hyperlipidemia     Unspecified chronic bronchitis      Past Surgical History:   Procedure Laterality Date    CARDIAC CATHETERIZATION      CORONARY ANGIOPLASTY      CORONARY ARTERY STENT PLACEMENT      HX BACK SURGERY      HX CORONARY ARTERY BYPASS GRAFT      HX MASTECTOMY, SIMPLE Bilateral     HX TUBAL LIGATION      NECK  SURGERY       Social History     Socioeconomic History    Marital status: Widowed     Spouse name: Not on file    Number of children: Not on file    Years of education: Not on file    Highest education level: Not on file   Occupational History    Not on file   Tobacco Use    Smoking status: Every Day     Current packs/day: 0.50     Average packs/day: 0.5 packs/day for 52.8 years (26.4 ttl pk-yrs)     Types: Cigarettes     Start date: 64    Smokeless tobacco: Never   Vaping Use    Vaping status: Never Used   Substance and Sexual Activity    Alcohol use: Yes     Comment:  occasional    Drug use: Yes     Types: Marijuana    Sexual activity: Not on file   Other Topics Concern    Not on file   Social History Narrative    Not on file     Social Determinants of Health     Financial Resource Strain: Not on file   Transportation Needs: Not on file   Social Connections: Low Risk (07/09/2024)    Social Connections     SDOH Social Isolation: 5 or more times a week   Intimate Partner Violence: Not on file   Housing Stability: Not on file      Family Medical History:       Problem Relation (Age of Onset)    Arthritis Mother, Maternal Grandmother    Cancer Father, Other    Heart Attack Father, Other    Heart Disease Brother, Other    Hypertension (High Blood Pressure) Mother, Father, Other    Pacemaker Mother          Vital Signs:  Temp  Avg: 37 C (98.6 F)  Min: 36.4 C (97.5 F)  Max: 37.5 C (99.5 F)    Pulse  Avg: 77.7  Min: 71  Max: 98 BP  Min: 102/61  Max: 125/72   Resp  Avg: 17.4  Min: 16  Max: 19 SpO2  Avg: 96 %  Min: 91 %  Max: 99 %          Input/Output    Intake/Output Summary (Last 24 hours) at 07/14/2024 1423  Last data filed at 07/14/2024 1257  Gross per 24 hour   Intake 1266.66 ml   Output --   Net 1266.66 ml    I/O last shift:  10/05 0700 - 10/05 1859  In: 1146.66 [P.O.:240; Blood:906.66]  Out: -      LABS:   Results for orders placed or performed during the hospital encounter of 07/08/24 (from the past 24 hours)   H & H   Result Value Ref Range    HGB 7.2 (L) 11.5 - 16.0 g/dL    HCT 77.3 (L) 65.1 - 46.0 %   H & H   Result Value Ref Range    HGB 6.7 (LL) 11.5 - 16.0 g/dL    HCT 78.6 (L) 65.1 - 46.0 %   COMPREHENSIVE METABOLIC PNL, FASTING   Result Value Ref Range    SODIUM 146 (H) 133 - 144 mmol/L    POTASSIUM 3.2 3.2 - 5.0 mmol/L    CHLORIDE 106 96 - 106 mmol/L    CO2 TOTAL 24 22 - 30 mmol/L    ANION  GAP 16 7 - 18 mmol/L    BUN 44 (H) 8 - 23 mg/dL    CREATININE 8.95 (H) 0.50 - 0.90 mg/dL    ESTIMATED GFR 59 (L) >90 mL/min/1.12m^2    ALBUMIN 2.7 (L) 3.5 - 5.2 g/dL    CALCIUM 8.7  8.3 - 89.2 mg/dL    GLUCOSE 859 (H) 74 - 109 mg/dL    ALKALINE PHOSPHATASE 393 (H) 35 - 129 U/L    ALT (SGPT) 573 (H) 0 - 33 U/L    AST (SGOT) 950 (H) 0 - 32 U/L    BILIRUBIN TOTAL 2.0 (H) 0.2 - 1.2 mg/dL    PROTEIN TOTAL 5.2 (L) 6.4 - 8.3 g/dL   PRODUCT: FFP/PLASMA - UNITS : 2 Units   Result Value Ref Range    Coding System [id number]     UNIT NUMBER [id number]     BLOOD COMPONENT TYPE THAWED PLASMA     UNIT DIVISION 00     UNIT DISPENSE STATUS ISSUED     TRANSFUSION STATUS OK TO TRANSFUSE     Product Code Z7315C99     Coding System [id number]     UNIT NUMBER [id number]     BLOOD COMPONENT TYPE THAWED PLASMA     UNIT DIVISION 00     UNIT DISPENSE STATUS ALLOCATED     TRANSFUSION STATUS OK TO TRANSFUSE     Product Code Z7315C99    CBC   Result Value Ref Range    WBC 7.5 3.7 - 11.0 x10^3/uL    RBC 2.60 (L) 3.85 - 5.22 x10^6/uL    HGB 7.0 (L) 11.5 - 16.0 g/dL    HCT 77.3 (L) 65.1 - 46.0 %    MCV 86.9 78.0 - 100.0 fL    MCH 26.9 26.0 - 32.0 pg    MCHC 31.0 31.0 - 35.5 g/dL    RDW-CV 83.9 (H) 88.4 - 15.5 %    PLATELETS 40 (L) 150 - 400 x10^3/uL    MPV 10.5 8.7 - 12.5 fL   PT/INR   Result Value Ref Range    PROTHROMBIN TIME 17.7 (H) 12.1 - 15.3 seconds    INR 1.42 (H) 0.86 - 1.14   PTT (PARTIAL THROMBOPLASTIN TIME)   Result Value Ref Range    APTT 28.6 22.4 - 34.8 seconds   FIBRINOGEN   Result Value Ref Range    FIBRINOGEN 459 251 - 576 mg/dL     IMAGES:  Results for orders placed or performed during the hospital encounter of 07/08/24 (from the past 72 hours)   CT ABDOMEN PELVIS WO IV CONTRAST     Status: None    Narrative    [name]    CT ABDOMEN PELVIS WO IV CONTRAST performed on 07/12/2024 5:00 PM.    INDICATION:  bleeding from laparoscopy incision, significant drop in hemoglobin and hypotensive, to rule out intraperitoneal/retroperitoneal bleeding   Additional History:  bleeding at incision site and abnormal labs; hx of tubal    TECHNIQUE:  Unenhanced CT abdomen and pelvis with axial, coronal, and sagittal  multiplanar reformations. Dose modulation, automated exposure control, and/or iterative reconstruction were used for dose reduction.    COMPARISON:  Contrast-enhanced CT dated 07/08/2024  ___________________________________  FINDINGS:    LOWER THORAX: Sternal wires. No lung base infiltrates. Normal heart size. Calcified coronary arteries. Small sliding hiatal hernia.    Chronic and degenerative bony changes. Scoliosis.    Evaluation of abdominal viscera is suboptimal due to noncontrast technique.  HEPATOBILIARY:      Hepatomegaly. Patient reportedly has metastatic liver disease which is not well seen on noncontrast CT.     Reportedly interval cholecystectomy. No surgical clips identified however. Clinical significance uncertain.    There is mixed hyperdense and hypodense material/fluid in the gallbladder fossa. This is thought to represent hematoma within the gallbladder fossa with fluid tracking inferiorly. It is primarily situated anterior to the hepatic flexure of the colon. Best demonstrated on axial series 3 image 41-49. Also sagittal series 6 image 72. Measures craniocaudal up to 10 cm on sagittal series 6 image 72.. Transaxial up to about 3.5 x 3 cm. There is some fluid superior to the left hepatic lobe between the liver and the anterior abdominal wall and central diaphragm. This is presumably related to blood. There is moderate volume of free fluid in the pelvis. This is dependently hyperdense and compatible with hemoperitoneum. There is a small amount of fluid in the bilateral paracolic gutters. There is a small amount of perisplenic fluid. There is a small hiatal hernia with some fluid tracking into the posterior mediastinum at this level.    PANCREAS: Pancreas grossly unremarkable.  SPLEEN: No splenomegaly is seen.  ADRENALS: No adrenal nodules are seen.  KIDNEYS: Multiple bilateral intrarenal nonobstructing calculi. Small left renal probable cysts for which no further workup is needed. No hydronephrosis  or asymmetric perinephric fat stranding.  LYMPH NODES: No retroperitoneal lymphadenopathy is seen.  VESSELS: Abdominal aorta is normal in caliber.  GI TRACT: Colonic diverticulosis. No obvious changes to suggest acute diverticulitis though the sigmoid colon and rectum are partially obscured by blood. No dilated small bowel loops. No bowel obstruction or ileus. Normal appendix is identified.  PELVIS:  There are tubal ligation clips. No urinary bladder distention.    There is dressing material associated with what may represent a laparoscopy port in the right upper mid abdomen. There are some hyperdense material extending from the rectus muscle to the skin which could represent blood. There is some gas bubbles at the level of the umbilicus, extraperitoneal which may represent postsurgical change.    No free air or bowel obstruction.  ___________________________________    Impression    1. Hematoma associated with the gallbladder fossa with fluid tracking inferiorly.     2. Moderate volume hemoperitoneum, more so blood lying in the dependent pelvis, though also around the liver and spleen and pericolic gutters.    3. Other findings as described above.      Radiologist location ID: [id number]     CT ABDOMEN PELVIS WO IV CONTRAST     Status: None    Narrative    [name]    CT ABDOMEN PELVIS WO IV CONTRAST performed on 07/13/2024 8:53 AM.    INDICATION:  abdomen more distended and tender   Additional History:  Abdominal pain    TECHNIQUE:  Unenhanced CT abdomen and pelvis with axial, coronal, and sagittal multiplanar reformations. Dose modulation, automated exposure control, and/or iterative reconstruction were used for dose reduction.    COMPARISON:  CT of the abdomen and pelvis 07/12/2024  ___________________________________  FINDINGS:    The lower chest shows coronary artery calcifications and presumed postsurgical changes of CABG.    The liver is enlarged. Mild periportal edema is evident. A hematoma in the  gallbladder fossa is grossly similar compared to the prior study with a small amount of hemoperitoneum tracking inferiorly. The hemoperitoneum itself is slightly decreased in volume. There is a small amount of  perihepatic fluid similar compared to prior study. Hemoperitoneum deeper in the pelvis remains moderate slightly decreased in overall volume.    The adrenal glands and pancreas are unremarkable. The spleen is enlarged. Bilateral nephrolithiasis is evident. The bladder is distended. The uterus is grossly normal. Bilateral tubal ligation clips are evident. Colonic diverticulosis is evident. No acute abnormality of the bowel is seen.    Atherosclerosis is seen throughout the aorta and its branches without aneurysm.    Postsurgical changes are seen throughout the anterior abdominal wall with a few small subcutaneous hematomas, grossly similar compared to the prior study. No acute skeletal abnormality is seen. ___________________________________    Impression    1. Grossly stable hematoma in the gallbladder fossa with slight decrease in volume of hemoperitoneum.  2. Additional stable findings as above.        Radiologist location ID: [id number]       Physical Exam:  Physical Exam  Vitals reviewed.   Constitutional:       Appearance: She is normal weight. She is ill-appearing.   HENT:      Head: Normocephalic.      Nose: Nose normal.      Mouth/Throat:      Pharynx: Oropharynx is clear.   Eyes:      Extraocular Movements: Extraocular movements intact.      Conjunctiva/sclera: Conjunctivae normal.   Cardiovascular:      Rate and Rhythm: Normal rate.      Pulses: Normal pulses.   Pulmonary:      Effort: Pulmonary effort is normal.   Abdominal:      Palpations: Abdomen is soft.   Musculoskeletal:         General: Normal range of motion.      Cervical back: Normal range of motion.   Skin:     General: Skin is dry.   Neurological:      General: No focal deficit present.      Mental Status: She is alert and oriented to  person, place, and time. Mental status is at baseline.   Psychiatric:         Mood and Affect: Mood normal.         Behavior: Behavior normal.         Thought Content: Thought content normal.         Judgment: Judgment normal.       Problem List:  Active Hospital Problems   (*Primary Problem)    Diagnosis    *Acute hypoxic respiratory failure (CMS HCC)    NSVT (nonsustained ventricular tachycardia) (CMS HCC)    Heart failure, unspecified HF chronicity, unspecified heart failure type (CMS HCC)    Acute cholecystitis    Thickening of wall of gallbladder with pericholecystic fluid    Symptomatic anemia    COVID-19    Acute blood loss anemia    Transaminitis    Malignant neoplasm of lung, unspecified laterality, unspecified part of lung (CMS HCC)    Pulmonary HTN (CMS HCC)    Pneumonia due to COVID-19 virus    Mood disorder (CMS HCC)    Anemia, unspecified type    Cigarette smoker    Metastatic cancer to liver    History of breast cancer     Chronic    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)    Paroxysmal atrial fibrillation (CMS HCC)     Chronic    Hypertension     Chronic  CAD (coronary artery disease)      Assessment/Plan:    68 year old female presenting with acute hypoxic respiratory failure:    Acute hypoxic respiratory failure:  CXR revealing vague pulmonary opacities that could be mild PNA or atelectasis.  She is COVID positive this admission.  CTA chest without evidence of PE.  She is on p.o. antibiotics.    Small-cell lung CA with liver metastasis:  Follows with Medical Oncology on outpatient basis at [facility].  She was to receive carboplatin/etoposide/Tecentriq on 07/08/2024 when she presented with acute respiratory symptoms that prompted further evaluation by the emergency room as stated in the above HPI.  Withhold current chemotherapy regimen until resolution of acute conditions.    Normocytic hypochromic anemia:   Current H/H = 7.0/22.6.  Recent PRBC transfusion.  Has received  multiple PRBC transfusions since being admitted.  Could be secondary to acute illness or metastatic disease.  Noted hemoperitoneum per recent CT abdomen/pelvis.  Vitamin B12 normal at 1106.  Folate low at 3.5.  On daily folic acid .  Copper normal at 113.  Noted transaminitis as well as elevated alk-phos.  This is likely related to liver mets.  Continue to monitor counts.  Transfuse with PRBC if hemoglobin less than 7 grams/deciliter or greater than 7 and symptomatic.  Will benefit from blood transfusion.    Thrombocytopenia:  Current counts = 40,000.  No overt bruising or bleeding.  Likely secondary to underlying metastatic disease.  Defer further thrombocytopenia panel at this time.  Continue to monitor counts.  Transfuse with platelets if <50 K and actively bleeding or <10 K w/wo bleeding.  Noted slightly elevated PT/INR.  Normal PTT and fibrinogen.  FFP transfusion on hold.    Cholelithiasis: General surgery is following.  She is s/p cholecystectomy  07/11/2024.  Noted hemoperitoneum per recent CT abdomen/pelvis.  General surgery without further surgical intervention at this time - recommending serial H/H checks.    Hypertension: Managed with antihypertensives.    HLD:  On statin.    CAD:  History of CABG x1.  On cardioprotective medication, 81 mg ASA daily.      Rosina Ice, APRN, CNP    Attending note:  I have independently seen and examined the patient on the same date of service as our mid-level.  I agree with the plan of care as documented by my mid-level.  I have personally reviewed  the pertinent medications, labs  and radiographic studies  as documented in my mid-level note.  The case was discussed in detail with the midlevel provider.  I agree with the above note.  Some changes and additions were made.  I spent more than 50% of the total encounter time for the care of this patient and documentation.      Norleen Amen, MD

## 2024-07-14 NOTE — Care Plan (Signed)
 Problem: Wound  Goal: Optimal Coping  Outcome: Ongoing (see interventions/notes)  Goal: Optimal Functional Ability  Outcome: Ongoing (see interventions/notes)  Goal: Absence of Infection Signs and Symptoms  Outcome: Ongoing (see interventions/notes)  Goal: Improved Oral Intake  Outcome: Ongoing (see interventions/notes)  Goal: Optimal Pain Control and Function  Outcome: Ongoing (see interventions/notes)  Goal: Skin Health and Integrity  Outcome: Ongoing (see interventions/notes)  Intervention: Optimize Skin Protection  Recent Flowsheet Documentation  Taken 07/14/2024 1100 by Erle NOVAK, RN  Pressure Reduction Techniques:   Mobility is maximized   Moisture, shear and nutrition are maximized   Frequent weight shifting encouraged  Pressure Reduction Devices: Repositioning wedges/pillows utilized  Taken 07/14/2024 0755 by Erle NOVAK, RN  Pressure Reduction Techniques:   Mobility is maximized   Moisture, shear and nutrition are maximized   Frequent weight shifting encouraged  Pressure Reduction Devices: Repositioning wedges/pillows utilized  Goal: Optimal Wound Healing  Outcome: Ongoing (see interventions/notes)  Intervention: Promote Wound Healing  Recent Flowsheet Documentation  Taken 07/14/2024 1100 by Erle NOVAK, RN  Pressure Reduction Techniques:   Mobility is maximized   Moisture, shear and nutrition are maximized   Frequent weight shifting encouraged  Pressure Reduction Devices: Repositioning wedges/pillows utilized  Taken 07/14/2024 0755 by Erle NOVAK, RN  Pressure Reduction Techniques:   Mobility is maximized   Moisture, shear and nutrition are maximized   Frequent weight shifting encouraged  Pressure Reduction Devices: Repositioning wedges/pillows utilized     Problem: Adult Inpatient Plan of Care  Goal: Plan of Care Review  Outcome: Ongoing (see interventions/notes)  Goal: Patient-Specific Goal (Individualized)  Outcome: Ongoing (see interventions/notes)  Goal: Absence of Hospital-Acquired Illness or Injury  Outcome: Ongoing  (see interventions/notes)  Intervention: Identify and Manage Fall Risk  Recent Flowsheet Documentation  Taken 07/14/2024 1600 by Erle NOVAK, RN  Safety Promotion/Fall Prevention:   fall prevention program maintained   safety round/check completed  Taken 07/14/2024 1400 by Erle NOVAK, RN  Safety Promotion/Fall Prevention:   fall prevention program maintained   safety round/check completed  Taken 07/14/2024 1200 by Erle NOVAK, RN  Safety Promotion/Fall Prevention:   fall prevention program maintained   safety round/check completed  Taken 07/14/2024 1000 by Erle NOVAK, RN  Safety Promotion/Fall Prevention:   fall prevention program maintained   safety round/check completed  Taken 07/14/2024 0800 by Erle NOVAK, RN  Safety Promotion/Fall Prevention:   fall prevention program maintained   safety round/check completed  Taken 07/14/2024 0755 by Erle NOVAK, RN  Safety Promotion/Fall Prevention:   fall prevention program maintained   safety round/check completed  Intervention: Prevent Skin Injury  Recent Flowsheet Documentation  Taken 07/14/2024 1600 by Erle NOVAK, RN  Body Position: supine, head elevated  Taken 07/14/2024 1400 by Erle NOVAK, RN  Body Position: supine, head elevated  Taken 07/14/2024 1200 by Erle NOVAK, RN  Body Position: supine, head elevated  Taken 07/14/2024 1100 by Erle NOVAK, RN  Skin Protection: adhesive use limited  Taken 07/14/2024 1000 by Erle NOVAK, RN  Body Position: supine, head elevated  Taken 07/14/2024 0800 by Erle NOVAK, RN  Body Position: supine, head elevated  Taken 07/14/2024 0755 by Erle NOVAK, RN  Skin Protection: adhesive use limited  Intervention: Prevent and Manage VTE (Venous Thromboembolism) Risk  Recent Flowsheet Documentation  Taken 07/14/2024 0755 by Erle NOVAK, RN  VTE Prevention/Management: ambulation promoted  Goal: Optimal Comfort and Wellbeing  Outcome: Ongoing (see interventions/notes)  Goal: Rounds/Family Conference  Outcome: Ongoing (see interventions/notes)     Care delivered  per above plan.

## 2024-07-14 NOTE — Assessment & Plan Note (Signed)
 Acute blood loss anemia or acute on chronic anemia, and mild bleeding from 1 of the laparoscopic incisions  Likely secondary to acute bleeding and underlying metastatic disease  S/p vitamin K 10 mg IV x1 dose on 10/03,   S/p 2 units PRBC transfusion on 10/3  She also received 1 units platelets and 2 units FFP on 10/04   CT abdomen done on 10/03 evening showed hematoma associated with the gallbladder fossa with fluid tracking inferiorly, moderate volume hemoperitoneum  CT abdomen repeated on 10/04 due to patient complaining of worsening abdomen pain and distention, showed stable hematoma in gallbladder fossa with slight decrease in volume of hemoperitoneum  Patient again at bleeding this morning from laparoscopic incision right lower abdomen, s/p suture by General surgery and bleeding was controlled   Prior to suturing 2 FFP  were ordered, received 1 FFP, repeat hemoglobin 7.0, will transfuse 1 more unit PRBC   Anemia panel not suggestive of iron  deficiency  Stool occult blood   Monitor CBC and transfuse for hemoglobin less than 7  General surgery and Hematology are following, appreciate input

## 2024-07-14 NOTE — Progress Notes (Signed)
 [facility] Progress Note    [name]       68 y.o.       Date of service: 07/14/2024  Date of Admission:  07/08/2024    Hospital Day:  LOS: 6 days   CC: Acute hypoxic respiratory failure (CMS HCC)    Subjective:  Nursing reported this morning that patient was still actively bleeding from laparoscopic incision.  Hemoglobin dropped again early this morning to 6.7.  She received 1 unit PRBC overnight.  She was seen by General surgery, s/p suture and bleeding was controlled.  Patient was seen and examined at bedside this morning.  She denied any complaints.  Her bleeding from right lower abdomen is controlled since the incision was sutured.    Objective:   Filed Vitals:    07/14/24 1232 07/14/24 1237 07/14/24 1242 07/14/24 1357   BP: 102/61 116/64 107/62 121/75   Pulse: 76 75 77 78   Resp: 17 17 16 17    Temp: 36.7 C (98.1 F) 36.7 C (98.1 F) 36.6 C (97.9 F) 36.7 C (98.1 F)   SpO2: 97% 97% 96% 98%     Oxygen Device  SpO2: 98 %  Flow (L/min) (Oxygen Therapy): 2   Flow (L/Min) (NICU-PICU): 2 LPM    albuterol  (PROVENTIL ) 2.5 mg / 3 mL (0.083%) neb solution, 2.5 mg, Nebulization, 4x/day  aluminum-magnesium  hydroxide-simethicone (MAG-AL PLUS) 200-200-20 mg per 5 mL oral liquid, 30 mL, Oral, Q4H PRN  [START ON 07/15/2024] amiodarone  (CORDARONE ) tablet, 200 mg, Oral, Daily with Breakfast  anastrozole  (ARIMIDEX ) tablet, 1 mg, Oral, Daily  budesonide (PULMICORT RESPULES) 0.5 mg/2 mL nebulizer suspension, 1 mg, Nebulization, 2x/day   And  arformoterol (BROVANA) 15 mcg/2 mL nebulizer solution, 15 mcg, Nebulization, 2x/day  atorvastatin  (LIPITOR) tablet, 40 mg, Oral, Daily  benzonatate (TESSALON) capsule, 100 mg, Oral, Q8H PRN  cefpodoxime (VANTIN) tablet, 400 mg, Oral, 2x/day-Food  cholecalciferol  (VITAMIN D3) 1000 unit (25 mcg) tablet, 1,000 Units, Oral, Daily  clonazePAM  (klonoPIN ) tablet, 0.5 mg, Oral, 2x/day PRN  NS 250 mL flush bag, , Intravenous, Q15 Min PRN   And  D5W 250 mL flush  bag, , Intravenous, Q15 Min PRN  dexAMETHasone  (DECADRON ) tablet 6 mg, 6 mg, Oral, Daily  diphenhydrAMINE (BENADRYL) capsule, 25 mg, Oral, HS PRN  DULoxetine  (CYMBALTA ) delayed release capsule, 60 mg, Oral, Daily  [Held by provider] enoxaparin PF (LOVENOX) 40 mg/0.4 mL SubQ injection, 40 mg, Subcutaneous, Q24H  fluticasone  (FLONASE ) 50 mcg per spray nasal spray, 1 Spray, Each Nostril, Daily PRN  folic acid  (FOLVITE ) tablet, 1 mg, Oral, Daily  gabapentin  (NEURONTIN ) capsule, 400 mg, Oral, 3x/day  HYDROcodone -acetaminophen  (NORCO) 5-325 mg per tablet, 1 Tablet, Oral, Q6H PRN  losartan  (COZAAR ) tablet, 25 mg, Oral, Daily  magnesium  hydroxide (MILK OF MAGNESIA) 400mg  per 5mL oral liquid, 15 mL, Oral, 4x/day PRN  metoprolol  tartrate (LOPRESSOR ) tablet, 25 mg, Oral, 2x/day  multivitamin-minerals-iron  oral liquid, 15 mL, Oral, Daily  NS bolus infusion 40 mL, 40 mL, Intravenous, Once PRN  NS bolus infusion 40 mL, 40 mL, Intravenous, Once PRN  NS flush syringe, 3 mL, Intracatheter, Q8HRS  NS flush syringe, 3 mL, Intracatheter, Q1H PRN  OLANZapine  (zyPREXA ) tablet, 5 mg, Oral, NIGHTLY  omega-3 fatty acids (LOVAZA ) capsule, 1 g, Oral, Daily  ondansetron  (ZOFRAN ) 2 mg/mL injection, 4 mg, Intravenous, Q6H PRN  pantoprazole  (PROTONIX ) delayed release tablet, 40 mg, Oral, Daily Before Lunch  polyethylene glycol (MIRALAX) oral packet, 17 g, Oral, Daily  prochlorperazine  (COMPAZINE )  tablet, 10 mg, Oral, 3x/day PRN  traZODone  (DESYREL ) tablet, 150 mg, Oral, NIGHTLY        Physical Exam:  General: No acute distress  Cardiac: Regular rate and rhythm, no murmur auscultated.  Respiratory: Clear to auscultation bilaterally , no wheeze, rhonchi  Abdomen:soft, right lower abdomen laparoscopic incisions covered with clean dry gauze, expected minimal tenderness present in right upper quadrant, no organomegaly  Neurological: Alert and Orient X 3; No gross focal neurological deficits  Extremities: No peripheral edema noted     Results for  orders placed or performed during the hospital encounter of 07/08/24 (from the past 24 hours)   H & H    Collection Time: 07/14/24  2:22 AM   Result Value Ref Range    HGB 6.7 (LL) 11.5 - 16.0 g/dL    HCT 78.6 (L) 65.1 - 46.0 %   COMPREHENSIVE METABOLIC PNL, FASTING    Collection Time: 07/14/24  2:22 AM   Result Value Ref Range    SODIUM 146 (H) 133 - 144 mmol/L    POTASSIUM 3.2 3.2 - 5.0 mmol/L    CHLORIDE 106 96 - 106 mmol/L    CO2 TOTAL 24 22 - 30 mmol/L    ANION GAP 16 7 - 18 mmol/L    BUN 44 (H) 8 - 23 mg/dL    CREATININE 8.95 (H) 0.50 - 0.90 mg/dL    ESTIMATED GFR 59 (L) >90 mL/min/1.13m^2    ALBUMIN 2.7 (L) 3.5 - 5.2 g/dL    CALCIUM 8.7 8.3 - 89.2 mg/dL    GLUCOSE 859 (H) 74 - 109 mg/dL    ALKALINE PHOSPHATASE 393 (H) 35 - 129 U/L    ALT (SGPT) 573 (H) 0 - 33 U/L    AST (SGOT) 950 (H) 0 - 32 U/L    BILIRUBIN TOTAL 2.0 (H) 0.2 - 1.2 mg/dL    PROTEIN TOTAL 5.2 (L) 6.4 - 8.3 g/dL   PRODUCT: FFP/PLASMA - UNITS : 2 Units    Collection Time: 07/14/24  8:00 AM   Result Value Ref Range    Coding System [id number]     UNIT NUMBER [id number]     BLOOD COMPONENT TYPE THAWED PLASMA     UNIT DIVISION 00     UNIT DISPENSE STATUS ISSUED     TRANSFUSION STATUS OK TO TRANSFUSE     Product Code Z7315C99     Coding System [id number]     UNIT NUMBER [id number]     BLOOD COMPONENT TYPE THAWED PLASMA     UNIT DIVISION 00     UNIT DISPENSE STATUS ALLOCATED     TRANSFUSION STATUS OK TO TRANSFUSE     Product Code Z7315C99    CBC    Collection Time: 07/14/24  1:31 PM   Result Value Ref Range    WBC 7.5 3.7 - 11.0 x10^3/uL    RBC 2.60 (L) 3.85 - 5.22 x10^6/uL    HGB 7.0 (L) 11.5 - 16.0 g/dL    HCT 77.3 (L) 65.1 - 46.0 %    MCV 86.9 78.0 - 100.0 fL    MCH 26.9 26.0 - 32.0 pg    MCHC 31.0 31.0 - 35.5 g/dL    RDW-CV 83.9 (H) 88.4 - 15.5 %    PLATELETS 40 (L) 150 - 400 x10^3/uL    MPV 10.5 8.7 - 12.5 fL   PT/INR    Collection Time: 07/14/24  1:31 PM   Result Value Ref Range    PROTHROMBIN TIME 17.7 (  H) 12.1 - 15.3 seconds    INR 1.42 (H)  0.86 - 1.14    Narrative    In the setting of warfarin therapy, a moderate-intensity INR goal range is 2.0 to 3.0 and a high-intensity INR goal range is 2.5 to 3.5.    INR is ONLY validated to determine the level of anticoagulation with vitamin K antagonists (warfarin). Other factors may elevate the INR including but not limited to direct oral anticoagulants (DOACs), liver dysfunction, vitamin K deficiency, DIC, factor deficiencies, and factor inhibitors.   COMPREHENSIVE METABOLIC PANEL, NON-FASTING    Collection Time: 07/14/24  1:31 PM   Result Value Ref Range    SODIUM 145 (H) 133 - 144 mmol/L    POTASSIUM 3.1 (L) 3.2 - 5.0 mmol/L    CHLORIDE 104 96 - 106 mmol/L    CO2 TOTAL 22 22 - 30 mmol/L    ANION GAP 19 (H) 7 - 18 mmol/L    BUN 44 (H) 8 - 23 mg/dL    CREATININE 9.00 (H) 0.50 - 0.90 mg/dL    ESTIMATED GFR 62 (L) >90 mL/min/1.77m^2    ALBUMIN 2.8 (L) 3.5 - 5.2 g/dL    CALCIUM 8.8 8.3 - 89.2 mg/dL    GLUCOSE 840 (H) 74 - 109 mg/dL    ALKALINE PHOSPHATASE 412 (H) 35 - 129 U/L    ALT (SGPT) 499 (H) 0 - 33 U/L    AST (SGOT) 834 (H) 0 - 32 U/L    BILIRUBIN TOTAL 2.6 (H) 0.2 - 1.2 mg/dL    PROTEIN TOTAL 5.3 (L) 6.4 - 8.3 g/dL   THYROID STIMULATING HORMONE WITH FREE T4 REFLEX    Collection Time: 07/14/24  1:31 PM   Result Value Ref Range    TSH 1.440 0.270 - 4.200 uIU/mL   PTT (PARTIAL THROMBOPLASTIN TIME)    Collection Time: 07/14/24  1:31 PM   Result Value Ref Range    APTT 28.6 22.4 - 34.8 seconds   FIBRINOGEN    Collection Time: 07/14/24  1:31 PM   Result Value Ref Range    FIBRINOGEN 459 251 - 576 mg/dL         Micro:   No results found for any visits on 07/08/24 (from the past 96 hours).      Radiology:             Assessment/ Plan:   Assessment & Plan  Acute hypoxic respiratory failure (CMS HCC)  Pneumonia due to COVID-19 virus  Likely due to COVID and possible superimposed bacterial pneumonia  Currently on 2 L nasal cannula  Respiratory panel positive for COVID  CT angiogram did not show any evidence pulmonary  embolism although marginal increase in the size of infrahilar mass, right hilar and mediastinal lymphadenopathy.  Continue p.o. Decadron    IV Rocephin has been de-escalated to p.o. cefpodoxime  Continue Brovana and Pulmicort   Pulmonary is following, appreciate input    Acute cholecystitis  Thickening of wall of gallbladder with pericholecystic fluid  S/p laparoscopic cholecystectomy on 10/02  Has developed gallbladder fossa hematoma and hemoperitoneum  Tolerating regular diet   Appreciate General surgery input     Acute blood loss anemia  Acute blood loss anemia or acute on chronic anemia, and mild bleeding from 1 of the laparoscopic incisions  Likely secondary to acute bleeding and underlying metastatic disease  S/p vitamin K 10 mg IV x1 dose on 10/03,   S/p 2 units PRBC transfusion on 10/3  She also received  1 units platelets and 2 units FFP on 10/04   CT abdomen done on 10/03 evening showed hematoma associated with the gallbladder fossa with fluid tracking inferiorly, moderate volume hemoperitoneum  CT abdomen repeated on 10/04 due to patient complaining of worsening abdomen pain and distention, showed stable hematoma in gallbladder fossa with slight decrease in volume of hemoperitoneum  Patient again at bleeding this morning from laparoscopic incision right lower abdomen, s/p suture by General surgery and bleeding was controlled   Prior to suturing 2 FFP  were ordered, received 1 FFP, repeat hemoglobin 7.0, will transfuse 1 more unit PRBC   Anemia panel not suggestive of iron  deficiency  Stool occult blood   Monitor CBC and transfuse for hemoglobin less than 7  General surgery and Hematology are following, appreciate input   NSVT (nonsustained ventricular tachycardia) (CMS HCC)  Patient developed nonsustained V-tach last night   S/p amiodarone  drip   Continue amiodarone   200 mg p.o. 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)  Malignant neoplasm of lung, unspecified laterality, unspecified part of lung (CMS HCC)  Known history of small-cell carcinoma, planned for outpatient chemotherapy with carboplatin/etoposide/tecenteriq  Oncology is following, recommended to withhold current chemotherapy regimen until acute illness resolves     CAD (coronary artery disease)  History of CABG x1   Has been on hold due to bleeding  Continue atorvastatin , metoprolol     Hypertension  Pressure control  Continue losartan , metoprolol   Paroxysmal atrial fibrillation (CMS HCC)  Rate controlled, continue metoprolol , not on anticoagulation  History of breast cancer  S/p mastectomy  Metastatic cancer to liver  Worsening metastatic liver disease likely causing elevated transaminases  Transaminitis  Likely secondary to liver metastasis  Pulmonary HTN (CMS HCC)  Pulmonary is following  Mood disorder (CMS HCC)  Continue trazodone   Cigarette smoker       Deep vein thrombosis prophylaxis-SCDs due to anemia      Candyce Salles, MD

## 2024-07-14 NOTE — Assessment & Plan Note (Signed)
 Known history of small-cell carcinoma, planned for outpatient chemotherapy with carboplatin/etoposide/tecenteriq  Oncology is following, recommended to withhold current chemotherapy regimen until acute illness resolves

## 2024-07-14 NOTE — Care Plan (Signed)
 Problem: Wound  Goal: Optimal Coping  Outcome: Ongoing (see interventions/notes)  Goal: Optimal Functional Ability  Outcome: Ongoing (see interventions/notes)  Goal: Absence of Infection Signs and Symptoms  Outcome: Ongoing (see interventions/notes)  Goal: Improved Oral Intake  Outcome: Ongoing (see interventions/notes)  Goal: Optimal Pain Control and Function  Outcome: Ongoing (see interventions/notes)  Goal: Skin Health and Integrity  Outcome: Ongoing (see interventions/notes)  Goal: Optimal Wound Healing  Outcome: Ongoing (see interventions/notes)     Problem: Adult Inpatient Plan of Care  Goal: Plan of Care Review  Outcome: Ongoing (see interventions/notes)  Goal: Patient-Specific Goal (Individualized)  Outcome: Ongoing (see interventions/notes)  Goal: Absence of Hospital-Acquired Illness or Injury  Outcome: Ongoing (see interventions/notes)  Intervention: Identify and Manage Fall Risk  Recent Flowsheet Documentation  Taken 07/14/2024 0600 by Winn MATSU, RN  Safety Promotion/Fall Prevention:   activity supervised   fall prevention program maintained   safety round/check completed  Intervention: Prevent Skin Injury  Recent Flowsheet Documentation  Taken 07/14/2024 0600 by Winn MATSU, RN  Body Position: supine, head elevated  Goal: Optimal Comfort and Wellbeing  Outcome: Ongoing (see interventions/notes)  Goal: Rounds/Family Conference  Outcome: Ongoing (see interventions/notes)

## 2024-07-14 NOTE — Care Plan (Signed)
 Problem: Wound  Goal: Optimal Coping  07/14/2024 1747 by Erle NOVAK, RN  Outcome: Ongoing (see interventions/notes)  07/14/2024 1700 by Erle NOVAK, RN  Outcome: Ongoing (see interventions/notes)  Goal: Optimal Functional Ability  07/14/2024 1747 by Erle NOVAK, RN  Outcome: Ongoing (see interventions/notes)  07/14/2024 1700 by Erle NOVAK, RN  Outcome: Ongoing (see interventions/notes)  Goal: Absence of Infection Signs and Symptoms  07/14/2024 1747 by Erle NOVAK, RN  Outcome: Ongoing (see interventions/notes)  07/14/2024 1700 by Erle NOVAK, RN  Outcome: Ongoing (see interventions/notes)  Goal: Improved Oral Intake  07/14/2024 1747 by Erle NOVAK, RN  Outcome: Ongoing (see interventions/notes)  07/14/2024 1700 by Erle NOVAK, RN  Outcome: Ongoing (see interventions/notes)  Goal: Optimal Pain Control and Function  07/14/2024 1747 by Erle NOVAK, RN  Outcome: Ongoing (see interventions/notes)  07/14/2024 1700 by Erle NOVAK, RN  Outcome: Ongoing (see interventions/notes)  Goal: Skin Health and Integrity  07/14/2024 1747 by Erle NOVAK, RN  Outcome: Ongoing (see interventions/notes)  07/14/2024 1700 by Erle NOVAK, RN  Outcome: Ongoing (see interventions/notes)  Intervention: Optimize Skin Protection  Recent Flowsheet Documentation  Taken 07/14/2024 1100 by Erle NOVAK, RN  Pressure Reduction Techniques:   Mobility is maximized   Moisture, shear and nutrition are maximized   Frequent weight shifting encouraged  Pressure Reduction Devices: Repositioning wedges/pillows utilized  Taken 07/14/2024 0755 by Erle NOVAK, RN  Pressure Reduction Techniques:   Mobility is maximized   Moisture, shear and nutrition are maximized   Frequent weight shifting encouraged  Pressure Reduction Devices: Repositioning wedges/pillows utilized  Goal: Optimal Wound Healing  07/14/2024 1747 by Erle NOVAK, RN  Outcome: Ongoing (see interventions/notes)  07/14/2024 1700 by Erle NOVAK, RN  Outcome: Ongoing (see interventions/notes)  Intervention: Promote Wound Healing  Recent Flowsheet Documentation  Taken 07/14/2024 1100 by Erle NOVAK,  RN  Pressure Reduction Techniques:   Mobility is maximized   Moisture, shear and nutrition are maximized   Frequent weight shifting encouraged  Pressure Reduction Devices: Repositioning wedges/pillows utilized  Taken 07/14/2024 0755 by Erle NOVAK, RN  Pressure Reduction Techniques:   Mobility is maximized   Moisture, shear and nutrition are maximized   Frequent weight shifting encouraged  Pressure Reduction Devices: Repositioning wedges/pillows utilized     Problem: Adult Inpatient Plan of Care  Goal: Plan of Care Review  07/14/2024 1747 by Erle NOVAK, RN  Outcome: Ongoing (see interventions/notes)  07/14/2024 1700 by Erle NOVAK, RN  Outcome: Ongoing (see interventions/notes)  Goal: Patient-Specific Goal (Individualized)  07/14/2024 1747 by Erle NOVAK, RN  Outcome: Ongoing (see interventions/notes)  07/14/2024 1700 by Erle NOVAK, RN  Outcome: Ongoing (see interventions/notes)  Goal: Absence of Hospital-Acquired Illness or Injury  07/14/2024 1747 by Erle NOVAK, RN  Outcome: Ongoing (see interventions/notes)  07/14/2024 1700 by Erle NOVAK, RN  Outcome: Ongoing (see interventions/notes)  Intervention: Identify and Manage Fall Risk  Recent Flowsheet Documentation  Taken 07/14/2024 1600 by Erle NOVAK, RN  Safety Promotion/Fall Prevention:   fall prevention program maintained   safety round/check completed  Taken 07/14/2024 1400 by Erle NOVAK, RN  Safety Promotion/Fall Prevention:   fall prevention program maintained   safety round/check completed  Taken 07/14/2024 1200 by Erle NOVAK, RN  Safety Promotion/Fall Prevention:   fall prevention program maintained   safety round/check completed  Taken 07/14/2024 1000 by Erle NOVAK, RN  Safety Promotion/Fall Prevention:   fall prevention program maintained   safety round/check completed  Taken 07/14/2024 0800 by Erle NOVAK, RN  Safety Promotion/Fall Prevention:   fall  prevention program maintained   safety round/check completed  Taken 07/14/2024 0755 by Erle NOVAK, RN  Safety Promotion/Fall Prevention:   fall prevention program maintained    safety round/check completed  Intervention: Prevent Skin Injury  Recent Flowsheet Documentation  Taken 07/14/2024 1600 by Erle NOVAK, RN  Body Position: supine, head elevated  Taken 07/14/2024 1400 by Erle NOVAK, RN  Body Position: supine, head elevated  Taken 07/14/2024 1200 by Erle NOVAK, RN  Body Position: supine, head elevated  Taken 07/14/2024 1100 by Erle NOVAK, RN  Skin Protection: adhesive use limited  Taken 07/14/2024 1000 by Erle NOVAK, RN  Body Position: supine, head elevated  Taken 07/14/2024 0800 by Erle NOVAK, RN  Body Position: supine, head elevated  Taken 07/14/2024 0755 by Erle NOVAK, RN  Skin Protection: adhesive use limited  Intervention: Prevent and Manage VTE (Venous Thromboembolism) Risk  Recent Flowsheet Documentation  Taken 07/14/2024 1700 by Erle NOVAK, RN  Sequential Compression Device (SCDs): Sequential compression device off  Taken 07/14/2024 0755 by Erle NOVAK, RN  VTE Prevention/Management: ambulation promoted  Goal: Optimal Comfort and Wellbeing  07/14/2024 1747 by Erle NOVAK, RN  Outcome: Ongoing (see interventions/notes)  07/14/2024 1700 by Erle NOVAK, RN  Outcome: Ongoing (see interventions/notes)  Goal: Rounds/Family Conference  07/14/2024 1747 by Erle NOVAK, RN  Outcome: Ongoing (see interventions/notes)  07/14/2024 1700 by Erle NOVAK, RN  Outcome: Ongoing (see interventions/notes)     Care delivered per above plan.

## 2024-07-14 NOTE — Progress Notes (Signed)
 [facility]        Name: [name] MRN:  [id number]   Date of Birth: 1955-12-07 Age: 68 y.o.   Date: 07/14/2024       Provider: Josette SHAUNNA Doffing, MD   PCP: Garvin Lemmings, FNP    Objective:  [name] is a 68 y.o. female resting in bed she denies any complaints except for she continues to bleed from a right lower quadrant trocar site.  Vital signs are stable she is afebrile hemoglobin did drop again.        Objective:    Physical Exam:   Vital Signs:  Temperature: 36.6 C (97.9 F)  Heart Rate: 77  BP (Non-Invasive): 107/62  Respiratory Rate: 16  SpO2: 96 %      Physical Exam:   Abdomen is soft nondistended nontender there is active blood oozing from a small hematoma in the right lower quadrant trocar site.             Assessment:  Problem List[1]      Plan:      This was cleansed thoroughly and opened up at bedside there was no pulsatile bleeding it was subsequently packed with Surgicel and closed with 2 interrupted Monocryl sutures.        Monica Doffing, MD, FACS    Portions of this note may be dictated using voice recognition software or a dictation service. Variances in spelling and vocabulary are possible and unintentional. Not all errors are caught/corrected. Please notify the dino if any discrepancies are noted or if the meaning of any statement is not clear.        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)    History of breast cancer    Simple chronic bronchitis (CMS HCC)    Metastatic cancer to liver    Malaise    Feeling unwell    RUQ pain    Lung  mass    Mediastinal adenopathy    Cigarette smoker    Acute hypoxic respiratory failure (CMS HCC)    COVID-19    Acute blood loss anemia    Transaminitis    Malignant neoplasm of lung, unspecified laterality, unspecified part of lung (CMS HCC)    Pulmonary HTN (CMS HCC)    Pneumonia due to COVID-19 virus    Mood disorder (CMS HCC)    Anemia, unspecified type    Thickening of wall of gallbladder with pericholecystic fluid    Symptomatic anemia    Acute cholecystitis    NSVT (nonsustained ventricular tachycardia) (CMS HCC)    Heart failure, unspecified HF chronicity, unspecified heart failure type (CMS HCC)

## 2024-07-14 NOTE — Assessment & Plan Note (Signed)
 Likely secondary to liver metastasis

## 2024-07-14 NOTE — Assessment & Plan Note (Signed)
 Continue trazodone

## 2024-07-14 NOTE — Progress Notes (Signed)
 [facility]    Cardiology Inpatient Progress Note    [name], [name], 68 y.o. female  Encounter Start Date:  07/08/2024  Inpatient Admission Date: 07/08/2024   Date of Service: 07/08/2024   Date of Birth:  08/03/1956  PCP:  Garvin Lemmings, FNP    Subjective:    Dr. Almeda patient.  Dr. Sharyle is covering the weekend.    Feeling better. No chest pain. Breathing better.    Exam:  Temperature: 36.9 C (98.4 F)  Heart Rate: 77  BP (Non-Invasive): 116/60  Respiratory Rate: 19  SpO2: 98 %    Intake/Output Summary (Last 24 hours) at 07/14/2024 1156  Last data filed at 07/14/2024 0900  Gross per 24 hour   Intake 1788.33 ml   Output --   Net 1788.33 ml      General/Constitutional: no acute distress  Heart/Cardiovascular:  Regular rate and rhythm.  No murmur.  S1,S2 are normal  Respiratory/Lungs:  Decreased breath sounds bilaterally.  No wheezes, rales, rhonchi.  Extremities:  No lower extremity edema.   Neurological: alert and oriented.    Labs:    Results for orders placed or performed during the hospital encounter of 07/08/24 (from the past 24 hours)   LACTIC ACID - SECOND REFLEX    Collection Time: 07/13/24 12:58 PM   Result Value Ref Range    LACTIC ACID 3.8 (H) 0.5 - 2.0 mmol/L   H & H    Collection Time: 07/13/24  2:45 PM   Result Value Ref Range    HGB 7.2 (L) 11.5 - 16.0 g/dL    HCT 77.3 (L) 65.1 - 46.0 %   H & H    Collection Time: 07/14/24  2:22 AM   Result Value Ref Range    HGB 6.7 (LL) 11.5 - 16.0 g/dL    HCT 78.6 (L) 65.1 - 46.0 %   COMPREHENSIVE METABOLIC PNL, FASTING    Collection Time: 07/14/24  2:22 AM   Result Value Ref Range    SODIUM 146 (H) 133 - 144 mmol/L    POTASSIUM 3.2 3.2 - 5.0 mmol/L    CHLORIDE 106 96 - 106 mmol/L    CO2 TOTAL 24 22 - 30 mmol/L    ANION GAP 16 7 - 18 mmol/L    BUN 44 (H) 8 - 23 mg/dL    CREATININE 8.95 (H) 0.50 - 0.90 mg/dL    ESTIMATED GFR 59 (L) >90 mL/min/1.84m^2    ALBUMIN 2.7 (L) 3.5 - 5.2 g/dL    CALCIUM 8.7 8.3 - 89.2 mg/dL     GLUCOSE 859 (H) 74 - 109 mg/dL    ALKALINE PHOSPHATASE 393 (H) 35 - 129 U/L    ALT (SGPT) 573 (H) 0 - 33 U/L    AST (SGOT) 950 (H) 0 - 32 U/L    BILIRUBIN TOTAL 2.0 (H) 0.2 - 1.2 mg/dL    PROTEIN TOTAL 5.2 (L) 6.4 - 8.3 g/dL   PRODUCT: FFP/PLASMA - UNITS : 2 Units    Collection Time: 07/14/24  8:00 AM   Result Value Ref Range    Coding System [id number]     UNIT NUMBER [id number]     BLOOD COMPONENT TYPE THAWED PLASMA     UNIT DIVISION 00     UNIT DISPENSE STATUS ALLOCATED     TRANSFUSION STATUS OK TO TRANSFUSE     Product Code E2684V00     Coding System [id number]     UNIT NUMBER [id number]  BLOOD COMPONENT TYPE THAWED PLASMA     UNIT DIVISION 00     UNIT DISPENSE STATUS ALLOCATED     TRANSFUSION STATUS OK TO TRANSFUSE     Product Code [id number]             Imaging Studies:     Last Echo  Results for orders placed during the hospital encounter of 07/08/24    TRANSTHORACIC ECHOCARDIOGRAM - ADULT 07/09/2024  8:05 AM    Narrative  **See full report in linked PDF document**  [facility]  [address] [place], [place], [place] [address]    Transthoracic Echocardiographic Report    ______________________________________________________________________________  Name: [name], [name]                              MRN: [id number]                Weight: 137 lb  Study Date: 07/09/2024 07:48 AM                      DOB: 1956/01/29             Height: 60.5 in  Gender: Female                                       Age: 16 yrs                 BSA: 1.6 m2  Accession #: [id number]                           BP: 154/90 mmHg  Patient Location: [facility]CHOCARDIOGRAM [facility]  Ordering Provider: CHERI ESTELL SAUNDERS  Tech: Sonny Fell    ______________________________________________________________________________  Procedure:  Transthoracic complete echo with contrast, 2D, spectral and tissue Doppler, color flow Doppler, M-mode.    Quality:  The study images were of technically adequate quality.    Indications:  Pulmonary HTN (CMS HCC),Heart failure    Conclusions:  The left ventricular ejection fraction by visual assessment is estimated to be 55-60%.    Findings  Left Ventricle:   Normal wall thickness. Normal left ventricular size. The left ventricular ejection fraction by visual assessment is estimated to be 55-60%. Left ventricular systolic function is  normal. No segmental/regional wall motion abnormalities identified. Grade I Diastolic dysfunction present with normal left atrial pressure.  Right Ventricle:   Normal right ventricular size. Right ventricular systolic function cannot be assessed. Right ventricular systolic pressure is normal.  Left Atrium:   The left atrium is normal in size.  Right Atrium:   The right atrium is of normal size.  Mitral Valve:   The mitral valve is normal. No evidence of mitral stenosis. There is mild mitral regurgitation.  Tricuspid Valve:   The tricuspid valve is normal. There is mild tricuspid regurgitation. There is no evidence of tricuspid stenosis.  Aortic Valve:   The aortic valve is normal. No Aortic valve stenosis. No significant aortic regurgitation present.  Pulmonic Valve:   There is mild pulmonic regurgitation. There is no evidence of pulmonic valve stenosis.  IVC/Hepatic Veins:   Normal IVC size with >50% inspiratory collapse (estimated RA pressure 3 mmHg).  Aorta:   The aortic root is of normal size.  Pericardium/Pleural space:   Normal pericardium with no pericardial  effusion.    Electronically signed by: MD Jayson Rives on 07/09/2024 11:30 AM         STRESS TEST/MYOCARDIAL PERFUSION IMAGING  No results found for this or any previous visit.      CT ABDOMEN PELVIS WO IV CONTRAST  Result Date: 07/13/2024  Impression 1. Grossly stable hematoma in the gallbladder fossa with slight decrease in volume of hemoperitoneum. 2. Additional stable findings as above. Radiologist location ID: [id number]     CT ABDOMEN PELVIS WO IV CONTRAST  Result Date: 07/12/2024  Impression  1. Hematoma associated with the gallbladder fossa with fluid tracking inferiorly. 2. Moderate volume hemoperitoneum, more so blood lying in the dependent pelvis, though also around the liver and spleen and pericolic gutters. 3. Other findings as described above. Radiologist location ID: [id number]     MRI MRCP WO CONTRAST  Result Date: 07/10/2024  Impression * Mild extra hepatic bile duct dilatation as noted on CT 07/08/2024, no bile duct stone. * No gallstones, there is pericholecystic fluid as well as fluid adjacent to the liver. * Diffuse heterogeneous appearance to the liver as described above, this could certainly represent findings associated with acute hepatitis, differential considerations include regenerative nodularity in chronic liver disease, metastatic disease/lymphoma, and possibly sarcoid Radiologist location ID: [id number] Radiologist location ID: [id number]     US  RT UPPER QUADRANT  Result Date: 07/09/2024  Impression Gallbladder wall thickening with pericholecystic fluid consistent with acute cholecystitis. Dilated common bile duct without distal obstructing lesion identified. Hepatomegaly with hepatic steatosis. Trace ascites. Radiologist location ID: [id number]     CT ANGIO CHEST FOR PULMONARY EMBOLUS W IV CONTRAST  Result Date: 07/08/2024  Impression 1. No evidence of pulmonary embolism. 2. Marginal increase in the size of the right infrahilar mass, right hilar and mediastinal lymphadenopathy compared to the prior study. 3. Other chronic findings as described above. Radiologist location ID: [id number]     CT ABDOMEN PELVIS W IV CONTRAST  Result Date: 07/08/2024  Impression 1. Hepatomegaly, hepatic steatosis and hepatic lesions, not greatly changed and new pericholecystic fluid now present. 2. Multiple nonobstructing bilateral renal calculi. 3. New free fluid in the pelvis. Radiologist location ID: [id number]     XR AP MOBILE CHEST  Result Date: 07/08/2024  Impression 1. Right hilar  adenopathy. 2. Vague pulmonary opacities could be mild pneumonia or atelectasis. Radiologist location ID: [id number]     XR AP MOBILE CHEST  Result Date: 06/17/2024  Impression 1. No pneumothorax post bronchoscopy. 2. Right hilar mass. 3. Opacity right lung apex and right lung base, may be aspiration or atelectasis. Radiologist location ID: [id number]     CT CHEST ABDOMEN PELVIS W IV CONTRAST  Result Date: 06/14/2024  Impression Chest: 1. Right lower cervical, supraclavicular, mediastinal and right hilar adenopathy compatible with malignancy. 2. New expansile soft tissue thickening along the bronchovascular bundles of the right lower lobe, considered malignancy until otherwise proven. 3. Emphysema. Abdomen/pelvis: 1. New subcentimeter hypoechoic hepatic lesions, presumably metastatic. 2. Additional chronic and incidental findings as above. PET/CT may be of diagnostic benefit. Radiologist location ID: [id number]     CT BRAIN WO IV CONTRAST  Result Date: 06/14/2024  Impression No evidence of acute intracranial disease. Findings compatible with chronic small vessel ischemic changes. Note that in acute stroke, findings may not be visible on CT for the first 48 hours. Radiologist location ID: [id number]     US  GALLBLADDER  Result Date: 06/14/2024  Impression Unremarkable gallbladder. Mild chronic common bile duct dilation. Radiologist location ID:  [id number]     XR CHEST AP  Result Date: 06/14/2024  Impression Right hilar enlargement may represent mass, adenopathy or vascular enlargement. Enhanced CT chest would be the test of choice for further evaluation. A Significant Orange actionable finding has been sent via the [employer] Actionable Findings application on 06/14/2024 1:55 PM, Message ID [id number]. Receipt of this communication will be communicated to [facility] RADIOLOGY STAFF or responsible provider and will be documented in [employer] Actionable Findings System upon receiving the acknowledgement. Radiologist  location ID: [id number]          Inpatient Meds:  albuterol  (PROVENTIL ) 2.5 mg / 3 mL (0.083%) neb solution, 2.5 mg, Nebulization, 4x/day  aluminum-magnesium  hydroxide-simethicone (MAG-AL PLUS) 200-200-20 mg per 5 mL oral liquid, 30 mL, Oral, Q4H PRN  anastrozole  (ARIMIDEX ) tablet, 1 mg, Oral, Daily  budesonide (PULMICORT RESPULES) 0.5 mg/2 mL nebulizer suspension, 1 mg, Nebulization, 2x/day   And  arformoterol (BROVANA) 15 mcg/2 mL nebulizer solution, 15 mcg, Nebulization, 2x/day  atorvastatin  (LIPITOR) tablet, 40 mg, Oral, Daily  benzonatate (TESSALON) capsule, 100 mg, Oral, Q8H PRN  cefpodoxime (VANTIN) tablet, 400 mg, Oral, 2x/day-Food  cholecalciferol  (VITAMIN D3) 1000 unit (25 mcg) tablet, 1,000 Units, Oral, Daily  clonazePAM  (klonoPIN ) tablet, 0.5 mg, Oral, 2x/day PRN  NS 250 mL flush bag, , Intravenous, Q15 Min PRN   And  D5W 250 mL flush bag, , Intravenous, Q15 Min PRN  dexAMETHasone  (DECADRON ) tablet 6 mg, 6 mg, Oral, Daily  diphenhydrAMINE (BENADRYL) capsule, 25 mg, Oral, HS PRN  DULoxetine  (CYMBALTA ) delayed release capsule, 60 mg, Oral, Daily  [Held by provider] enoxaparin PF (LOVENOX) 40 mg/0.4 mL SubQ injection, 40 mg, Subcutaneous, Q24H  fluticasone  (FLONASE ) 50 mcg per spray nasal spray, 1 Spray, Each Nostril, Daily PRN  folic acid  (FOLVITE ) tablet, 1 mg, Oral, Daily  gabapentin  (NEURONTIN ) capsule, 400 mg, Oral, 3x/day  HYDROcodone -acetaminophen  (NORCO) 5-325 mg per tablet, 1 Tablet, Oral, Q6H PRN  losartan  (COZAAR ) tablet, 25 mg, Oral, Daily  magnesium  hydroxide (MILK OF MAGNESIA) 400mg  per 5mL oral liquid, 15 mL, Oral, 4x/day PRN  metoprolol  tartrate (LOPRESSOR ) tablet, 25 mg, Oral, 2x/day  multivitamin-minerals-iron  oral liquid, 15 mL, Oral, Daily  NS bolus infusion 40 mL, 40 mL, Intravenous, Once PRN  NS bolus infusion 40 mL, 40 mL, Intravenous, Once PRN  NS flush syringe, 3 mL, Intracatheter, Q8HRS  NS flush syringe, 3 mL, Intracatheter, Q1H PRN  OLANZapine  (zyPREXA ) tablet, 5 mg, Oral,  NIGHTLY  omega-3 fatty acids (LOVAZA ) capsule, 1 g, Oral, Daily  ondansetron  (ZOFRAN ) 2 mg/mL injection, 4 mg, Intravenous, Q6H PRN  pantoprazole  (PROTONIX ) delayed release tablet, 40 mg, Oral, Daily Before Lunch  polyethylene glycol (MIRALAX) oral packet, 17 g, Oral, Daily  prochlorperazine  (COMPAZINE ) tablet, 10 mg, Oral, 3x/day PRN  traZODone  (DESYREL ) tablet, 150 mg, Oral, NIGHTLY       Allergies[1]    Telemetry:  Sinus rhythm    Assessment/Plan:  Active Hospital Problems    Diagnosis    Primary Problem: Acute hypoxic respiratory failure (CMS HCC)    Ventricular tachycardia (CMS HCC)    Heart failure, unspecified HF chronicity, unspecified heart failure type (CMS HCC)    Acute cholecystitis    Thickening of wall of gallbladder with pericholecystic fluid    Symptomatic anemia    COVID-19    Acute blood loss anemia    Transaminitis    Malignant neoplasm of lung, unspecified laterality, unspecified part of lung (CMS HCC)    Pulmonary HTN (CMS HCC)    Pneumonia due to  COVID-19 virus    Mood disorder (CMS HCC)    Anemia, unspecified type    Cigarette smoker    Metastatic cancer to liver    History of breast cancer    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  Rhythm stabilized with amiodarone .  She completed IV amiodarone .  We will change risk in the oral amiodarone .  She has a history of atrial fibrillation.  She is not currently anticoagulated due to residual bleeding from her surgery and severe anemia.  Consider full anticoagulation when considered safe from a bleeding perspective.  She has coronary artery disease and also consider antiplatelet therapy, especially if she can not take anticoagulation Potassium is low and we will be replaced.  She will need close monitoring of electrolytes. Dr. Sharyle is available over the weekend if  needed.    The plan has been discussed with Dr. Sharyle who will dictate further recommendations/changes after their evaluation.    Morene Miu, PA-C            [1] No Known Allergies

## 2024-07-14 NOTE — Assessment & Plan Note (Signed)
 Pressure control  Continue losartan , metoprolol 

## 2024-07-14 NOTE — Progress Notes (Signed)
 [facility]  Progress Note    [name]  Date of service: 07/14/2024   Date of Admission:  07/08/2024    Hospital Day:  LOS: 6 days     Chief Complaint: shortness of breath      Subjective:   [name] is a 68 y.o. female w/recently diagnosed Small-cell carcinoma w/liver mets, COPD, current smoker - not on O2 at home, CABG. She was found to have lung mass on last admission and underwent bronchoscopy w/bx of station 7 which revealed the small cell carcinoma. She came to hospital from outpatient infusion center to start chemotherapy but had dyspnea and hypoxia and was sent to ER for further evaluation. She was found to have COVID. She reports she's been feeling bad for the last few weeks. CTA chest - no PE, marginal increase in size of right infrahilar mass, right hilar and mediastinal lymphadenopathy compared to prior study. CT A/P: hepatomegaly, hepatic steatosis, and hepatic lesions not much change, new pericholecystic fluid now present. We are consulted for acute hypoxic respiratory failure, known patient.      [name] currently on 2 L NC/93%.  She is awake and alert.  Feels breathing is doing okay at present time.  Has some mild cough.  No chest pain.  No fevers noted.  No N/V/D.  No associated symptoms or modifying factors.      10/2: Patient on 3LNC/96%. Awake and alert. Feels breathing is improving. She is s/p lap chole. No chest pain. No fevers noted. No n/v/d.  No associated symptoms or modifying factors.      10/3: Patient on room air/97%. Denies shortness of breath. Not much cough. No chest pain. Having bowel movements. No obvious signs of bleeding. Hgb 6.2 this morning, getting blood transfusion.  No associated symptoms or modifying factors.      10/4:  Today on 3L NC.  Received 2 units PRBCs yesterday.  Hemoglobin up to 8.1 today.  Receiving breathing treatment on exam.  Patient states she is not feeling much better today.    10/5:  On 2 L NC.  Hemoglobin dropped again  this morning so had to get 1 unit PRBC.    Vital Signs:  Temp  Avg: 37.1 C (98.7 F)  Min: 36.4 C (97.5 F)  Max: 37.5 C (99.5 F)    Pulse  Avg: 78.2  Min: 71  Max: 98 BP  Min: 103/56  Max: 125/72   Resp  Avg: 17.7  Min: 16  Max: 19 SpO2  Avg: 95.5 %  Min: 91 %  Max: 99 %          Input/Output    Intake/Output Summary (Last 24 hours) at 07/14/2024 1220  Last data filed at 07/14/2024 0900  Gross per 24 hour   Intake 1788.33 ml   Output --   Net 1788.33 ml    I/O last shift:  10/05 0700 - 10/05 1859  In: 668.33 [P.O.:240; Blood:428.33]  Out: -    albuterol  (PROVENTIL ) 2.5 mg / 3 mL (0.083%) neb solution, 2.5 mg, Nebulization, 4x/day  aluminum-magnesium  hydroxide-simethicone (MAG-AL PLUS) 200-200-20 mg per 5 mL oral liquid, 30 mL, Oral, Q4H PRN  [START ON 07/15/2024] amiodarone  (CORDARONE ) tablet, 200 mg, Oral, Daily with Breakfast  anastrozole  (ARIMIDEX ) tablet, 1 mg, Oral, Daily  budesonide (PULMICORT RESPULES) 0.5 mg/2 mL nebulizer suspension, 1 mg, Nebulization, 2x/day   And  arformoterol (BROVANA) 15 mcg/2 mL nebulizer solution, 15 mcg, Nebulization, 2x/day  atorvastatin  (LIPITOR)  tablet, 40 mg, Oral, Daily  benzonatate (TESSALON) capsule, 100 mg, Oral, Q8H PRN  cefpodoxime (VANTIN) tablet, 400 mg, Oral, 2x/day-Food  cholecalciferol  (VITAMIN D3) 1000 unit (25 mcg) tablet, 1,000 Units, Oral, Daily  clonazePAM  (klonoPIN ) tablet, 0.5 mg, Oral, 2x/day PRN  NS 250 mL flush bag, , Intravenous, Q15 Min PRN   And  D5W 250 mL flush bag, , Intravenous, Q15 Min PRN  dexAMETHasone  (DECADRON ) tablet 6 mg, 6 mg, Oral, Daily  diphenhydrAMINE (BENADRYL) capsule, 25 mg, Oral, HS PRN  DULoxetine  (CYMBALTA ) delayed release capsule, 60 mg, Oral, Daily  [Held by provider] enoxaparin PF (LOVENOX) 40 mg/0.4 mL SubQ injection, 40 mg, Subcutaneous, Q24H  fluticasone  (FLONASE ) 50 mcg per spray nasal spray, 1 Spray, Each Nostril, Daily PRN  folic acid  (FOLVITE ) tablet, 1 mg, Oral, Daily  gabapentin  (NEURONTIN ) capsule, 400 mg, Oral,  3x/day  HYDROcodone -acetaminophen  (NORCO) 5-325 mg per tablet, 1 Tablet, Oral, Q6H PRN  losartan  (COZAAR ) tablet, 25 mg, Oral, Daily  magnesium  hydroxide (MILK OF MAGNESIA) 400mg  per 5mL oral liquid, 15 mL, Oral, 4x/day PRN  metoprolol  tartrate (LOPRESSOR ) tablet, 25 mg, Oral, 2x/day  multivitamin-minerals-iron  oral liquid, 15 mL, Oral, Daily  NS bolus infusion 40 mL, 40 mL, Intravenous, Once PRN  NS bolus infusion 40 mL, 40 mL, Intravenous, Once PRN  NS flush syringe, 3 mL, Intracatheter, Q8HRS  NS flush syringe, 3 mL, Intracatheter, Q1H PRN  OLANZapine  (zyPREXA ) tablet, 5 mg, Oral, NIGHTLY  omega-3 fatty acids (LOVAZA ) capsule, 1 g, Oral, Daily  ondansetron  (ZOFRAN ) 2 mg/mL injection, 4 mg, Intravenous, Q6H PRN  pantoprazole  (PROTONIX ) delayed release tablet, 40 mg, Oral, Daily Before Lunch  polyethylene glycol (MIRALAX) oral packet, 17 g, Oral, Daily  potassium chloride  (K-DUR) extended release tablet, 20 mEq, Oral, Once  prochlorperazine  (COMPAZINE ) tablet, 10 mg, Oral, 3x/day PRN  traZODone  (DESYREL ) tablet, 150 mg, Oral, NIGHTLY        Physical Exam:    GENERAL:   SKIN: Warm and dry.  HEAD: Atraumatic, normocephalic.  EARS AND NOSE: Normal to examination.  NECK: Supple, Trachea midline.  CARDIAC: Regular rate and rhythm.  LUNGS:  Clear but diminished bilaterally.  2 L nasal cannula.  GI: Abdomen soft, non-tender, non-distended.   MUSCULOSKELETAL: Good muscle strength throughout.  EXTREMITIES: No pedal edema noted. Distal pulses equal bilaterally.  NEUROLOGICAL: Cranial nerves grossly intact. No gross focal deficit.  PSYCHIATRIC: Appropriate mood and affect.     Labs:     Results for orders placed or performed during the hospital encounter of 07/08/24 (from the past 24 hours)   LACTIC ACID - SECOND REFLEX   Result Value Ref Range    LACTIC ACID 3.8 (H) 0.5 - 2.0 mmol/L   H & H   Result Value Ref Range    HGB 7.2 (L) 11.5 - 16.0 g/dL    HCT 77.3 (L) 65.1 - 46.0 %   H & H   Result Value Ref Range    HGB 6.7 (LL)  11.5 - 16.0 g/dL    HCT 78.6 (L) 65.1 - 46.0 %   COMPREHENSIVE METABOLIC PNL, FASTING   Result Value Ref Range    SODIUM 146 (H) 133 - 144 mmol/L    POTASSIUM 3.2 3.2 - 5.0 mmol/L    CHLORIDE 106 96 - 106 mmol/L    CO2 TOTAL 24 22 - 30 mmol/L    ANION GAP 16 7 - 18 mmol/L    BUN 44 (H) 8 - 23 mg/dL    CREATININE 8.95 (H) 0.50 -  0.90 mg/dL    ESTIMATED GFR 59 (L) >90 mL/min/1.59m^2    ALBUMIN 2.7 (L) 3.5 - 5.2 g/dL    CALCIUM 8.7 8.3 - 89.2 mg/dL    GLUCOSE 859 (H) 74 - 109 mg/dL    ALKALINE PHOSPHATASE 393 (H) 35 - 129 U/L    ALT (SGPT) 573 (H) 0 - 33 U/L    AST (SGOT) 950 (H) 0 - 32 U/L    BILIRUBIN TOTAL 2.0 (H) 0.2 - 1.2 mg/dL    PROTEIN TOTAL 5.2 (L) 6.4 - 8.3 g/dL   PRODUCT: FFP/PLASMA - UNITS : 2 Units   Result Value Ref Range    Coding System [id number]     UNIT NUMBER [id number]     BLOOD COMPONENT TYPE THAWED PLASMA     UNIT DIVISION 00     UNIT DISPENSE STATUS ISSUED     TRANSFUSION STATUS OK TO TRANSFUSE     Product Code [id number]     Coding System [id number]     UNIT NUMBER [id number]     BLOOD COMPONENT TYPE THAWED PLASMA     UNIT DIVISION 00     UNIT DISPENSE STATUS ALLOCATED     TRANSFUSION STATUS OK TO TRANSFUSE     Product Code [id number]       Radiology:      Recent Results (from the past 824799999 hours)   CT CHEST WO IV CONTRAST    Collection Time: 12/28/23  7:06 AM    Narrative    ARLAND JEAN Lofstrom    CT CHEST WO IV CONTRAST performed on 12/28/2023 7:06 AM.    INDICATION:  R07.89: Sternal pain   Additional History:  cough, shortness of breath, sternal pain, hx smoker, open heart surgery    TECHNIQUE:  Unenhanced CT chest with axial, coronal, and sagittal multiplanar reformations. Dose modulation, automated exposure control, and/or iterative reconstruction were used for dose reduction.    COMPARISON:  12/18/2009.  ___________________________________  FINDINGS:    * No mediastinal or axillary adenopathy.  * Atherosclerosis with nonaneurysmal aortic and coronary artery involvement post CABG and possibly  stenting.  * Normal heart size.  * No pleural effusion.  * Mild emphysema. No focal consolidation, pulmonary edema or mass.  * Patent central airways.  * Bilateral nephrolithiasis. Nondistended gallbladder with cholelithiasis.  * No pertinent osseous or body wall findings. Unremarkable sternotomy. Bilateral mastectomy.    ___________________________________    Impression    1. No acute or suspicious findings in the chest.  2. Emphysema.  3. Incidental cholelithiasis and nephrolithiasis.       Radiologist location ID: [id number]       Recent Results (from the past 824799999 hours)   XR AP MOBILE CHEST    Collection Time: 07/08/24 10:48 AM    Narrative    Kennah JEAN Bradsher    XR AP MOBILE CHEST performed on 07/08/2024 10:48 AM.    INDICATION:  Chest Pain   Additional History:  chest pain    TECHNIQUE:  Single portable frontal view of the chest.  1 views/1 images submitted for interpretation.    COMPARISON:  Chest x-ray 06/17/2024  ___________________________________  FINDINGS:    Median sternotomy noted. Heart size and vascularity are similar. Right hilar adenopathy. Heart size normal.  Vague groundglass opacities at both lung bases and minimally right upper lobe atelectasis or minimal pneumonitis. No pleural effusion. No pneumothorax. Dextrocurvature thoracic spine.  ___________________________________    Impression    1. Right hilar adenopathy.  2. Vague pulmonary opacities could be mild pneumonia or atelectasis.        Radiologist location ID: [id number]     XR CHEST AP    Collection Time: 06/14/24  1:25 PM    Narrative    Rosaline JEAN Grindle    XR CHEST AP, 06/14/2024 1:25 PM    INDICATION:  dyspnea, respiratory illness   Additional History:  dyspnea, respiratory illness    TECHNIQUE:  Single portable frontal view of the chest.  1 views/1 images submitted for interpretation.  COMPARISON: 09/14/2023.  ___________________________________  FINDINGS:    The heart is within normal limits for size. Median sternotomy wires,  inferior surgical fusion, and numerous bilateral breast surgical clips are stable. The right hilum is enlarged. No effusion, infiltrate, or pneumothorax is seen. Bilateral apical pulmonary scar is stable. There is mild dextrocurvature of the thoracic spine.  ___________________________________    Impression    Right hilar enlargement may represent mass, adenopathy or vascular enlargement. Enhanced CT chest would be the test of choice for further evaluation.      A Significant Orange actionable finding has been sent via the [employer] Actionable Findings application on 06/14/2024 1:55 PM, Message ID [id number]. Receipt of this communication will be communicated to [facility] RADIOLOGY STAFF or responsible provider and will be documented in [employer] Actionable Findings System upon receiving the acknowledgement.      Radiologist location ID: [id number]     XR CHEST PA AND LATERAL    Collection Time: 02/23/23  2:16 PM    Narrative    Shanterria JEAN Beougher    XR CHEST PA AND LATERAL performed on 02/23/2023 2:16 PM.    INDICATION:  F17.200: Tobacco use disorder   Additional History:  Post-op CABG, H/O HTN, CAD, tobacco use    TECHNIQUE: Frontal and lateral views of the chest    COMPARISON:  02/09/2023  ___________________________________  FINDINGS:    Mild left basilar atelectasis or infiltrate and trace pleural effusion. Findings appear improved. Right lung appears clear with improved aeration right lower lung zone. No pneumothorax.    Heart size is borderline. Pulmonary vascularity is borderline.     Postop changes of CABG. Postop changes cervical spine.      Impression    Chest improved          Radiologist location ID: [id number]           Assessment/ Plan:   Active Hospital Problems    Diagnosis    Primary Problem: Acute hypoxic respiratory failure (CMS HCC)    NSVT (nonsustained ventricular tachycardia) (CMS HCC)    Heart failure, unspecified HF chronicity, unspecified heart failure type (CMS HCC)    Acute cholecystitis     Thickening of wall of gallbladder with pericholecystic fluid    Symptomatic anemia    COVID-19    Acute blood loss anemia    Transaminitis    Malignant neoplasm of lung, unspecified laterality, unspecified part of lung (CMS HCC)    Pulmonary HTN (CMS HCC)    Pneumonia due to COVID-19 virus    Mood disorder (CMS HCC)    Anemia, unspecified type    Cigarette smoker    Metastatic cancer to liver    History of breast cancer    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)    Paroxysmal atrial fibrillation (CMS HCC)    Hypertension    CAD (coronary artery disease)     PLAN:  Patient seen and examined. Reviewed  pertinent labs, imaging, and notes.  68 y/o female recently dx SCLC w/liver mets, COPD not on O2 at home, current smoker, was out outpatient infusion center to start chemo and came in for dyspnea and hypoxia, found to have COVID  CTA chest - no PE, marginal increase in size of right infrahilar mass, right hilar and mediastinal lymphadenopathy compared to prior study. CT A/P: hepatomegaly, hepatic steatosis, and hepatic lesions not much change, new pericholecystic fluid now present.   Wean oxygen as patient tolerates for SpO2 >/=92%, on 3 L today  Continue steroids  Agree with empiric antibiotics-ceftriaxone  Urine antigens negative  U/S showed acute cholecystitis with pericholecystic fluid and wall thickening  She is now s/p lap chole on 10/2  Had repeat CT of abdomen and pelvis which did not show increasing hemoperitoneum  Discussed case with Dr. Fabiene during rounding and he advised following serial hemoglobin and coags, no surgical intervention right now  Continue Brovana/Pulmicort   Sputum culture grew light growth yeast   Yesterday hemoglobin 6.2 and received 2 units PRBC  This morning hemoglobin dropped again she received 1 unit PRBC  We will follow up closely with General surgery to see their recommendation  Fecal occult ordered   Continue nebs  This patient was seen and examined with my attending  and he will add any further recommendations.       Ginger Moors, APRN, CNP,      _______________________________    I personally saw and evaluated the patient as part of a shared service with an APP.    My substantive findings are:  MDM (complete) I have personally managed 1 acute illness with systemic symptoms.  (COVID and cholecystitis requiring admission to the hospital)  I have reviewed and actively participated in management of patient's prescription medications.  Continue Brovana, Budesonide, and albuterol  nebs.          Elsie Marsa Gully, MD  07/14/2024 16:56

## 2024-07-14 NOTE — Assessment & Plan Note (Signed)
 History of CABG x1   Has been on hold due to bleeding  Continue atorvastatin , metoprolol 

## 2024-07-15 ENCOUNTER — Ambulatory Visit (HOSPITAL_BASED_OUTPATIENT_CLINIC_OR_DEPARTMENT_OTHER): Payer: Self-pay | Admitting: HEMATOLOGY-ONCOLOGY

## 2024-07-15 ENCOUNTER — Inpatient Hospital Stay (HOSPITAL_COMMUNITY)

## 2024-07-15 DIAGNOSIS — D6959 Other secondary thrombocytopenia: Secondary | ICD-10-CM

## 2024-07-15 DIAGNOSIS — S065XAA Traumatic subdural hemorrhage with loss of consciousness status unknown, initial encounter: Secondary | ICD-10-CM | POA: Insufficient documentation

## 2024-07-15 DIAGNOSIS — S065X0A Traumatic subdural hemorrhage without loss of consciousness, initial encounter: Secondary | ICD-10-CM

## 2024-07-15 LAB — CROSSMATCH RED CELLS - UNITS
UNIT DIVISION: 0
UNIT DIVISION: 0
UNIT DIVISION: 0
UNIT DIVISION: 0

## 2024-07-15 LAB — BASIC METABOLIC PANEL
ANION GAP: 19 mmol/L — ABNORMAL HIGH (ref 7–18)
BUN/CREA RATIO: 41
BUN: 48 mg/dL — ABNORMAL HIGH (ref 8–23)
CALCIUM: 8.9 mg/dL (ref 8.3–10.7)
CHLORIDE: 106 mmol/L (ref 96–106)
CO2 TOTAL: 22 mmol/L (ref 22–30)
CREATININE: 1.16 mg/dL — ABNORMAL HIGH (ref 0.50–0.90)
ESTIMATED GFR: 52 mL/min/1.73mˆ2 — ABNORMAL LOW (ref >90)
GLUCOSE: 121 mg/dL — ABNORMAL HIGH (ref 74–109)
POTASSIUM: 3.5 mmol/L (ref 3.2–5.0)
SODIUM: 147 mmol/L — ABNORMAL HIGH (ref 133–144)

## 2024-07-15 LAB — PT/INR
INR: 1.59 — ABNORMAL HIGH (ref 0.86–1.14)
PROTHROMBIN TIME: 19.2 s — ABNORMAL HIGH (ref 12.1–15.3)

## 2024-07-15 LAB — MAGNESIUM: MAGNESIUM: 2 mg/dL (ref 1.6–2.4)

## 2024-07-15 LAB — TYPE AND SCREEN
ABO/RH(D): O POS
ANTIBODY SCREEN: NEGATIVE
UNITS ORDERED: 4

## 2024-07-15 LAB — CBC
HCT: 25 % — ABNORMAL LOW (ref 34.8–46.0)
HGB: 7.9 g/dL — ABNORMAL LOW (ref 11.5–16.0)
MCH: 28 pg (ref 26.0–32.0)
MCHC: 31.6 g/dL (ref 31.0–35.5)
MCV: 88.7 fL (ref 78.0–100.0)
MPV: 9.9 fL (ref 8.7–12.5)
PLATELETS: 41 x10ˆ3/uL — ABNORMAL LOW (ref 150–400)
RBC: 2.82 x10ˆ6/uL — ABNORMAL LOW (ref 3.85–5.22)
RDW-CV: 16.4 % — ABNORMAL HIGH (ref 11.5–15.5)
WBC: 8.5 x10ˆ3/uL (ref 3.7–11.0)

## 2024-07-15 LAB — THROMBIN TIME (BOVINE), PLASMA: THROMBIN TIME: 16.4 s (ref 10.3–16.6)

## 2024-07-15 MED ORDER — SODIUM CHLORIDE 0.9 % IV BOLUS
40.0000 mL | INJECTION | Freq: Once | Status: AC | PRN
Start: 2024-07-15 — End: 2024-07-16

## 2024-07-15 MED ORDER — SODIUM CHLORIDE 0.9 % IV BOLUS
40.0000 mL | INJECTION | Freq: Once | Status: AC | PRN
Start: 2024-07-15 — End: 2024-07-15

## 2024-07-15 NOTE — Consults (Signed)
 [facility] Medicine [facility]  Neurology Consult  Note  [name], [name], 68 y.o. female  Date of Birth:  1956-06-14  Encounter Start Date:  07/08/2024  Inpatient Admission Date: 07/08/2024  Date of service: 07/15/2024  PCP:  Garvin Lemmings, FNP       Routine, ONE TIME, On Mon 07/15/24 at 1100, For 1 occurrence Reason for Consult: Small volume of acute hemorrhage in the subdural space overlying the left frontal lobe anteriorly and superiorly Physician Being Consulted: Dequandre Cordova     HPI:  [name] is a 68 y.o. 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  In ED WBC 7.5, HGB 9.3 (was 11.8 on 06/17/2024), chest x-ray notable for hilar adenopathy and bag pulmonary opacities concerning for possible atelectasis versus mild pneumonia.  Patient with slight transaminitis with AST 293, ALT 135, ALP 342 and CT abdomen pelvis notable for hepatomegaly with hepatic steatosis/hepatic lesions not greatly changed and new pericholecystic fluid present and multiple nonobstructing bilateral renal calculi with new free fluid in the pelvis.  Patient was noted to be saturating in the 80s on room air.  Requiring 2 L NC to get above 90%=.  E  Above-noted from the admission notes        Neurology update on 07-15-24 last night patient moved to ICU for confusion and found to have  small volume acute hemorrhage in the subdural space on left frontal lobe anteriorly/superiorly the patient is alert, oriented x2, awake following commands.  She is complaining of mild-to-moderate headache no visual complaints, she is moving all extremities against gravity with fair effort.,  currently no focal neurological complaints .    Allergies[1]  .  Past Medical History:   Diagnosis Date    Abdominal pain     Cancer (CMS HCC)     Chest pain     Chronic lung disease      Coronary artery disease     Dyslipidemia     Essential hypertension     Heart disease     History of percutaneous coronary intervention     History of stress test     Mixed hyperlipidemia     Unspecified chronic bronchitis           Past Surgical History:   Procedure Laterality Date    CARDIAC CATHETERIZATION      CORONARY ANGIOPLASTY      CORONARY ARTERY STENT PLACEMENT      HX BACK SURGERY      HX CORONARY ARTERY BYPASS GRAFT      HX MASTECTOMY, SIMPLE Bilateral     HX TUBAL LIGATION      NECK SURGERY            Social History     Socioeconomic History    Marital status: Widowed     Spouse name: Not on file    Number of children: Not on file    Years of education: Not on file    Highest education level: Not on file   Occupational History    Not on file   Tobacco Use    Smoking status: Every Day     Current packs/day: 0.50     Average packs/day: 0.5 packs/day for 52.8 years (26.4 ttl pk-yrs)     Types: Cigarettes     Start date:  1973    Smokeless tobacco: Never   Vaping Use    Vaping status: Never Used   Substance and Sexual Activity    Alcohol use: Yes     Comment: occasional    Drug use: Yes     Types: Marijuana    Sexual activity: Not on file   Other Topics Concern    Not on file   Social History Narrative    Not on file     Social Determinants of Health     Financial Resource Strain: Not on file   Transportation Needs: Not on file   Social Connections: Low Risk (07/09/2024)    Social Connections     SDOH Social Isolation: 5 or more times a week   Intimate Partner Violence: Not on file   Housing Stability: Not on file      albuterol  (PROVENTIL ) 2.5 mg / 3 mL (0.083%) neb solution, 2.5 mg, Nebulization, 4x/day  aluminum-magnesium  hydroxide-simethicone (MAG-AL PLUS) 200-200-20 mg per 5 mL oral liquid, 30 mL, Oral, Q4H PRN  amiodarone  (CORDARONE ) tablet, 200 mg, Oral, Daily with Breakfast  anastrozole  (ARIMIDEX ) tablet, 1 mg, Oral, Daily  budesonide (PULMICORT RESPULES) 0.5 mg/2 mL nebulizer suspension, 1 mg,  Nebulization, 2x/day   And  arformoterol (BROVANA) 15 mcg/2 mL nebulizer solution, 15 mcg, Nebulization, 2x/day  atorvastatin  (LIPITOR) tablet, 40 mg, Oral, Daily  benzonatate (TESSALON) capsule, 100 mg, Oral, Q8H PRN  cefpodoxime (VANTIN) tablet, 400 mg, Oral, 2x/day-Food  cholecalciferol  (VITAMIN D3) 1000 unit (25 mcg) tablet, 1,000 Units, Oral, Daily  clonazePAM  (klonoPIN ) tablet, 0.5 mg, Oral, 2x/day PRN  NS 250 mL flush bag, , Intravenous, Q15 Min PRN   And  D5W 250 mL flush bag, , Intravenous, Q15 Min PRN  dexAMETHasone  (DECADRON ) tablet 6 mg, 6 mg, Oral, Daily  diphenhydrAMINE (BENADRYL) capsule, 25 mg, Oral, HS PRN  DULoxetine  (CYMBALTA ) delayed release capsule, 60 mg, Oral, Daily  [Held by provider] enoxaparin PF (LOVENOX) 40 mg/0.4 mL SubQ injection, 40 mg, Subcutaneous, Q24H  fluticasone  (FLONASE ) 50 mcg per spray nasal spray, 1 Spray, Each Nostril, Daily PRN  folic acid  (FOLVITE ) tablet, 1 mg, Oral, Daily  gabapentin  (NEURONTIN ) capsule, 400 mg, Oral, 3x/day  HYDROcodone -acetaminophen  (NORCO) 5-325 mg per tablet, 1 Tablet, Oral, Q6H PRN  HYDROmorphone  (DILAUDID ) 0.5 mg/0.5 mL injection, 0.5 mg, Intravenous, Q4H PRN  losartan  (COZAAR ) tablet, 25 mg, Oral, Daily  magnesium  hydroxide (MILK OF MAGNESIA) 400mg  per 5mL oral liquid, 15 mL, Oral, 4x/day PRN  metoprolol  tartrate (LOPRESSOR ) tablet, 25 mg, Oral, 2x/day  multivitamin-minerals-iron  oral liquid, 15 mL, Oral, Daily  NS bolus infusion 40 mL, 40 mL, Intravenous, Once PRN  NS flush syringe, 3 mL, Intracatheter, Q8HRS  NS flush syringe, 3 mL, Intracatheter, Q1H PRN  OLANZapine  (zyPREXA ) tablet, 5 mg, Oral, NIGHTLY  omega-3 fatty acids (LOVAZA ) capsule, 1 g, Oral, Daily  ondansetron  (ZOFRAN ) 2 mg/mL injection, 4 mg, Intravenous, Q6H PRN  pantoprazole  (PROTONIX ) delayed release tablet, 40 mg, Oral, Daily Before Lunch  polyethylene glycol (MIRALAX) oral packet, 17 g, Oral, Daily  prochlorperazine  (COMPAZINE ) tablet, 10 mg, Oral, 3x/day PRN  traZODone   (DESYREL ) tablet, 150 mg, Oral, NIGHTLY      Current Facility-Administered Medications   Medication Dose Route Frequency Provider Last Rate Last Admin    albuterol  (PROVENTIL ) 2.5 mg / 3 mL (0.083%) neb solution  2.5 mg Nebulization 4x/day Soto, Yezenia G, APRN, CNP   2.5 mg at 07/14/24 2004    aluminum-magnesium  hydroxide-simethicone (MAG-AL PLUS) 200-200-20 mg per 5 mL oral liquid  30 mL Oral Q4H PRN Rudolfo Netter, PA-C        amiodarone  (CORDARONE ) tablet  200 mg Oral Daily with Breakfast Chinita Katz, PA-C   200 mg at 07/15/24 9076    anastrozole  (ARIMIDEX ) tablet  1 mg Oral Daily Rudolfo Netter, PA-C   1 mg at 07/15/24 0925    budesonide (PULMICORT RESPULES) 0.5 mg/2 mL nebulizer suspension  1 mg Nebulization 2x/day Schubring, Nicholas, PA-C   1 mg at 07/13/24 1920    And    arformoterol (BROVANA) 15 mcg/2 mL nebulizer solution  15 mcg Nebulization 2x/day Rudolfo Netter, PA-C   15 mcg at 07/13/24 1920    atorvastatin  (LIPITOR) tablet  40 mg Oral Daily Rudolfo Netter, PA-C   40 mg at 07/15/24 0923    benzonatate (TESSALON) capsule  100 mg Oral Q8H PRN Vaidyanathan, Balachandran R, MD   100 mg at 07/09/24 2000    cefpodoxime (VANTIN) tablet  400 mg Oral 2x/day-Food Modesta Bristle, MD   400 mg at 07/15/24 9077    cholecalciferol  (VITAMIN D3) 1000 unit (25 mcg) tablet  1,000 Units Oral Daily Rudolfo Netter, PA-C   1,000 Units at 07/15/24 9076    clonazePAM  (klonoPIN ) tablet  0.5 mg Oral 2x/day PRN Rudolfo Netter, PA-C   0.5 mg at 07/09/24 2000    NS 250 mL flush bag   Intravenous Q15 Min PRN Rudolfo Netter, PA-C        And    D5W 250 mL flush bag   Intravenous Q15 Min PRN Rudolfo Netter, PA-C        dexAMETHasone  (DECADRON ) tablet 6 mg  6 mg Oral Daily Vaidyanathan, Balachandran R, MD   6 mg at 07/15/24 9077    diphenhydrAMINE (BENADRYL) capsule  25 mg Oral HS PRN Rudolfo Netter, PA-C   25 mg at 07/09/24 2000    DULoxetine  (CYMBALTA ) delayed release  capsule  60 mg Oral Daily Rudolfo Netter, PA-C   60 mg at 07/15/24 9077    [Held by provider] enoxaparin PF (LOVENOX) 40 mg/0.4 mL SubQ injection  40 mg Subcutaneous Q24H Schubring, Nicholas, PA-C   40 mg at 07/10/24 1632    fluticasone  (FLONASE ) 50 mcg per spray nasal spray  1 Spray Each Nostril Daily PRN Rudolfo Netter, PA-C        folic acid  (FOLVITE ) tablet  1 mg Oral Daily Reatha Knee, APRN, CNP   1 mg at 07/15/24 9076    gabapentin  (NEURONTIN ) capsule  400 mg Oral 3x/day Rudolfo Netter, PA-C   400 mg at 07/15/24 9077    HYDROcodone -acetaminophen  (NORCO) 5-325 mg per tablet  1 Tablet Oral Q6H PRN Vaidyanathan, Balachandran R, MD   1 Tablet at 07/14/24 1733    HYDROmorphone  (DILAUDID ) 0.5 mg/0.5 mL injection  0.5 mg Intravenous Q4H PRN Ramakrishnaiah, Sreedevi, MD   0.5 mg at 07/14/24 2040    losartan  (COZAAR ) tablet  25 mg Oral Daily Rudolfo Netter, PA-C   25 mg at 07/14/24 9053    magnesium  hydroxide (MILK OF MAGNESIA) 400mg  per 5mL oral liquid  15 mL Oral 4x/day PRN Rudolfo Netter, PA-C   1,200 mg at 07/09/24 2000    metoprolol  tartrate (LOPRESSOR ) tablet  25 mg Oral 2x/day Rudolfo Netter, PA-C   25 mg at 07/14/24 2041    multivitamin-minerals-iron  oral liquid  15 mL Oral Daily Schubring, Netter, PA-C   15 mL at 07/15/24 0921    NS bolus infusion 40 mL  40 mL Intravenous Once PRN Ramakrishnaiah, Sreedevi, MD  NS flush syringe  3 mL Intracatheter Q8HRS Rudolfo Netter, PA-C   3 mL at 07/14/24 2046    NS flush syringe  3 mL Intracatheter Q1H PRN Rudolfo Netter, PA-C        OLANZapine  (zyPREXA ) tablet  5 mg Oral NIGHTLY Schubring, Nicholas, PA-C   5 mg at 07/14/24 2041    omega-3 fatty acids (LOVAZA ) capsule  1 g Oral Daily Rudolfo Netter, PA-C   1 g at 07/15/24 9077    ondansetron  (ZOFRAN ) 2 mg/mL injection  4 mg Intravenous Q6H PRN Rudolfo Netter, PA-C        pantoprazole  (PROTONIX ) delayed release tablet  40 mg Oral Daily Before Raliegh Modesta Bristle, MD   40 mg at 07/14/24 1114    polyethylene glycol (MIRALAX) oral packet  17 g Oral Daily Rudolfo Netter, PA-C   17 g at 07/14/24 9056    prochlorperazine  (COMPAZINE ) tablet  10 mg Oral 3x/day PRN Rudolfo Netter, PA-C   10 mg at 07/12/24 0746    traZODone  (DESYREL ) tablet  150 mg Oral NIGHTLY Schubring, Nicholas, PA-C   150 mg at 07/14/24 2041      Active Orders   Microbiology    FECAL OCCULT BLD (TRIPLE CARD)     Frequency: ONE TIME     Number of Occurrences: 1 Occurrences   Lab    BASIC METABOLIC PANEL     Frequency: 9469 - AM DRAW     Number of Occurrences: 1 Occurrences    BASIC METABOLIC PANEL     Frequency: 9469 - AM DRAW     Number of Occurrences: 1 Occurrences    BASIC METABOLIC PANEL     Frequency: 9469 - AM DRAW     Number of Occurrences: 1 Occurrences    CBC     Frequency: 0530 - AM DRAW     Number of Occurrences: 1 Occurrences    CBC     Frequency: 0530 - AM DRAW     Number of Occurrences: 1 Occurrences    CBC     Frequency: 0530 - AM DRAW     Number of Occurrences: 1 Occurrences    HELICOBACTER PYLORI AG, BY EIA     Frequency: ONE TIME     Number of Occurrences: 1 Occurrences    MAGNESIUM      Frequency: 0530 - AM DRAW     Number of Occurrences: 1 Occurrences    MAGNESIUM      Frequency: 0530 - AM DRAW     Number of Occurrences: 1 Occurrences    MAGNESIUM      Frequency: 0530 - AM DRAW     Number of Occurrences: 1 Occurrences    PHOSPHORUS     Frequency: 0530 - AM DRAW     Number of Occurrences: 1 Occurrences    PHOSPHORUS     Frequency: 0530 - AM DRAW     Number of Occurrences: 1 Occurrences    PHOSPHORUS     Frequency: 0530 - AM DRAW     Number of Occurrences: 1 Occurrences   Diet    DIET CARDIAC (2GM NA, LOW FAT, LOWCHOL)     Frequency: All Meals     Number of Occurrences: 1 Occurrences   Nursing    ACTIVITY     Frequency: TID (1000,1400,2200)     Number of Occurrences: Until Specified    ACTIVITY     Frequency: UNTIL DISCONTINUED     Number of Occurrences: Until Specified  APPLY  SEQUENTIAL COMPRESSION DEVICE     Frequency: ONE TIME     Number of Occurrences: 1 Occurrences    INTAKE AND OUTPUT Q4H     Frequency: Q4H     Number of Occurrences: Until Specified    MAINTAIN SEQUENTIAL COMPRESSION DEVICE     Frequency: CONTINUOUS     Number of Occurrences: Until Specified    MISC MD/DO TO NURSING     Frequency: UNTIL DISCONTINUED     Number of Occurrences: Until Specified     Order Comments: Consent for robotic cholecystectomy, Dr Nadean      MISC MD/DO TO NURSING     Frequency: UNTIL DISCONTINUED     Number of Occurrences: Until Specified     Order Comments: Advance diet as tolerated      MISCELLANEOUS MD/DO TO NURSE     Frequency: UNTIL DISCONTINUED     Number of Occurrences: Until Specified     Order Comments: apply vaseline gauze then (5) 4x4 gauzes and secure with tape to make pressure dressing to bleeding incisional site to LLQ. Hold all anticoagulants at this time.      Notify MD Vital Signs     Frequency: PRN     Number of Occurrences: Until Specified     Order Comments: MAP < 60      Notify MD Vital Signs     Frequency: PRN     Number of Occurrences: Until Specified    NURSE TO ENTER SECONDARY ORDER Other - (specify in comments) (ECG FOR CHEST PAIN)     Frequency: PRN     Number of Occurrences: Until Specified    NURSE TO ENTER SECONDARY ORDER Other - (specify in comments) (POC WHOLE BLOOD GLUCOSE (POC10) ON PATIENTS WHO ARE INSULIN  DEPENDENT AND NOTIFY ANESTHESIA IF BLOOD SUGAR IS LESS THAN 80 OR GREATER THAN 180)     Frequency: PRN     Number of Occurrences: Until Specified     Order Comments: Notify Anesthesia if blood sugar is less than 80 or greater than 180      NURSING-TRANSFUSE FFP/PLASMA - UNITS : 2 Units     Frequency: Transfusion     Number of Occurrences: 2 Occurrences     Order Comments: Please see the listed hyperlinks for additional information on the recommended infusion rates for blood products as determined by the [employer] ([employer]).       NURSING-TRANSFUSE PLATELETS - UNITS : 1 Units     Frequency: Transfusion     Number of Occurrences: 1 Occurrences     Order Comments: Please see the listed hyperlinks for additional information on the recommended infusion rates for blood products as determined by the [employer] ([employer]).      NURSING-TRANSFUSE PLATELETS - UNITS : 2 Units     Frequency: Transfusion     Number of Occurrences: 2 Occurrences     Order Comments: Please see the listed hyperlinks for additional information on the recommended infusion rates for blood products as determined by the [employer] ([employer]).      PT IS INTERMEDIATE RISK FOR VENOUS THROMBOEMBOLISM     Frequency: CONTINUOUS     Number of Occurrences: Until Specified    PULSE OXIMETRY CONTINUOUS     Frequency: CONTINUOUS     Number of Occurrences: Until Specified    Small Volume Medication Infusion Line Flush     Linked Order: And  Frequency: UNTIL DISCONTINUED     Number of Occurrences: Until Specified     Order Comments: All intermittent infusions/IVPBs will be run as a secondary infusion and must be flushed to ensure the patient receives the entire dose. Administer and document on the corresponding primary flush order that matches the base solution of the intermittent infusion/IVPB. An iso-osmotic solution is compatible and may be flushed with D5W or NS. Page the service pharmacist with any questions.      TELEMETRY MONITORING X 48H     Frequency: CONTINUOUS X 48 HRS     Number of Occurrences: 48 Hours    TELEMETRY MONITORING X 48H     Frequency: CONTINUOUS X 48 HRS     Number of Occurrences: 48 Hours    VITAL SIGNS  Q4H     Frequency: Q4H     Number of Occurrences: Until Specified    WEIGH PATIENT     Frequency: DAILY (0600)     Number of Occurrences: Until Specified    WEIGH PATIENT     Frequency: UPON ADMISSION     Number of Occurrences: 1 Occurrences   Code Status    FULL CODE: ATTEMPT RESUSCITATION / CPR     Frequency:  CONTINUOUS     Number of Occurrences: Until Specified     Order Comments: Patient wishes for full ICU level care including advanced airway interventions / mechanical ventilation.     In the event of pulseless cardiac arrest, patient consents to ACLS (advanced cardiac life support) to attempt resuscitation.  IE - Consents to chest compressions, life support including intubation, mechanical ventilation, defibrillation/cardioversion as indicated.         Consult    IP CONSULT TO CARDIOLOGY GOAD, JOHN     Frequency: ONE TIME     Number of Occurrences: 1 Occurrences    IP CONSULT TO GASTROENTEROLOGY OZDEN, NURI     Frequency: ONE TIME     Number of Occurrences: 1 Occurrences    IP CONSULT TO GENERAL SURGERY LUCENTE, FRANK C     Frequency: ONE TIME     Number of Occurrences: 1 Occurrences    IP CONSULT TO NEUROLOGY Suad Autrey     Frequency: ONE TIME     Number of Occurrences: 1 Occurrences    IP CONSULT TO NEUROSURGERY ORPHANOS, JOHN R     Frequency: ONE TIME     Number of Occurrences: 1 Occurrences    IP CONSULT TO ONCOLOGY PONUGUPATI, JOHN     Frequency: ONE TIME     Number of Occurrences: 1 Occurrences    IP CONSULT TO PULMONOLOGY WADE, ELSIE BLUNT     Frequency: ONE TIME     Number of Occurrences: 1 Occurrences   Isolation    ENHANCED DROPLET ISOLATION     Frequency: CONTINUOUS     Number of Occurrences: Until Specified     Order Comments: Private room  Wear a fitted N95/MSA/CAPR when entering room  Wear gown, gloves, and eye protection as indicated  Prior to transport, notify receiving department of precautions  Prior to transport, ensure patient is wearing a mask and follows Respiratory Hygiene/Cough Etiquette         Respiratory Care    IP CONSULT TO RESPIRATORY THERAPY     Frequency: ONE TIME     Number of Occurrences: 1 Occurrences    OXYGEN WEANING PARAMETERS     Frequency: UNTIL DISCONTINUED     Number of Occurrences: Until Specified  Blood Bank    CROSSMATCH RED CELLS - UNITS , 1 Units     Frequency:  Once     Number of Occurrences: 1 Occurrences    CROSSMATCH RED CELLS - UNITS , 1 Units     Frequency: Once     Number of Occurrences: 1 Occurrences   IV    INSERT & MAINTAIN PERIPHERAL IV ACCESS     Frequency: UNTIL DISCONTINUED     Number of Occurrences: Until Specified    PERIPHERAL IV DRESSING CHANGE     Frequency: PRN     Number of Occurrences: Until Specified   Precaution    FALL PRECAUTION     Frequency: CONTINUOUS     Number of Occurrences: Until Specified   Case Request    CASE REQUEST SURGICAL: ULTRASOUND ENDOBRONCHIAL     Frequency: ONE TIME     Number of Occurrences: 1 Occurrences   Medications    albuterol  (PROVENTIL ) 2.5 mg / 3 mL (0.083%) neb solution     Frequency: 4x/day     Dose: 2.5 mg     Route: Nebulization    aluminum-magnesium  hydroxide-simethicone (MAG-AL PLUS) 200-200-20 mg per 5 mL oral liquid     Frequency: Q4H PRN     Dose: 30 mL     Route: Oral    amiodarone  (CORDARONE ) tablet     Frequency: Daily with Breakfast     Dose: 200 mg     Route: Oral    anastrozole  (ARIMIDEX ) tablet     Frequency: Daily     Dose: 1 mg     Route: Oral    arformoterol (BROVANA) 15 mcg/2 mL nebulizer solution     Linked Order: And     Frequency: 2x/day     Dose: 15 mcg     Route: Nebulization    atorvastatin  (LIPITOR) tablet     Frequency: Daily     Dose: 40 mg     Route: Oral    benzonatate (TESSALON) capsule     Frequency: Q8H PRN     Dose: 100 mg     Route: Oral    budesonide (PULMICORT RESPULES) 0.5 mg/2 mL nebulizer suspension     Linked Order: And     Frequency: 2x/day     Dose: 1 mg     Route: Nebulization    cefpodoxime (VANTIN) tablet     Frequency: 2x/day-Food     Dose: 400 mg     Route: Oral    cholecalciferol  (VITAMIN D3) 1000 unit (25 mcg) tablet     Frequency: Daily     Dose: 1,000 Units     Route: Oral    clonazePAM  (klonoPIN ) tablet     Frequency: 2x/day PRN     Dose: 0.5 mg     Route: Oral    D5W 250 mL flush bag     Linked Order: And     Frequency: Q15 Min PRN     Route: Intravenous     dexAMETHasone  (DECADRON ) tablet 6 mg     Frequency: Daily     Dose: 6 mg     Route: Oral    diphenhydrAMINE (BENADRYL) capsule     Frequency: HS PRN     Dose: 25 mg     Route: Oral    DULoxetine  (CYMBALTA ) delayed release capsule     Frequency: Daily     Dose: 60 mg     Route: Oral    enoxaparin PF (LOVENOX) 40 mg/0.4  mL SubQ injection     Frequency: Q24H     Dose: 40 mg     Route: Subcutaneous    fluticasone  (FLONASE ) 50 mcg per spray nasal spray     Frequency: Daily PRN     Dose: 1 Spray     Route: Each Nostril    folic acid  (FOLVITE ) tablet     Frequency: Daily     Dose: 1 mg     Route: Oral    gabapentin  (NEURONTIN ) capsule     Frequency: 3x/day     Dose: 400 mg     Route: Oral    HYDROcodone -acetaminophen  (NORCO) 5-325 mg per tablet     Frequency: Q6H PRN     Dose: 1 Tablet     Route: Oral    HYDROmorphone  (DILAUDID ) 0.5 mg/0.5 mL injection     Frequency: Q4H PRN     Dose: 0.5 mg     Route: Intravenous    losartan  (COZAAR ) tablet     Frequency: Daily     Dose: 25 mg     Route: Oral    magnesium  hydroxide (MILK OF MAGNESIA) 400mg  per 5mL oral liquid     Frequency: 4x/day PRN     Dose: 15 mL     Route: Oral    metoprolol  tartrate (LOPRESSOR ) tablet     Frequency: 2x/day     Dose: 25 mg     Route: Oral    multivitamin-minerals-iron  oral liquid     Frequency: Daily     Dose: 15 mL     Route: Oral    NS 250 mL flush bag     Linked Order: And     Frequency: Q15 Min PRN     Route: Intravenous    NS bolus infusion 40 mL     Frequency: Once PRN     Dose: 40 mL     Route: Intravenous    NS flush syringe     Frequency: Q8HRS     Dose: 3 mL     Route: Intracatheter    NS flush syringe     Frequency: Q1H PRN     Dose: 3 mL     Route: Intracatheter    OLANZapine  (zyPREXA ) tablet     Frequency: NIGHTLY     Dose: 5 mg     Route: Oral    omega-3 fatty acids (LOVAZA ) capsule     Frequency: Daily     Dose: 1 g     Route: Oral    ondansetron  (ZOFRAN ) 2 mg/mL injection     Frequency: Q6H PRN     Dose: 4 mg     Route: Intravenous     pantoprazole  (PROTONIX ) delayed release tablet     Frequency: Daily Before Lunch     Dose: 40 mg     Route: Oral    polyethylene glycol (MIRALAX) oral packet     Frequency: Daily     Dose: 17 g     Route: Oral    prochlorperazine  (COMPAZINE ) tablet     Frequency: 3x/day PRN     Dose: 10 mg     Route: Oral    traZODone  (DESYREL ) tablet     Frequency: NIGHTLY     Dose: 150 mg     Route: Oral        Review of Systems:   All pertinent review of systems as addressed and detailed in HPI above      Physical Exam:  BP (!) 100/58   Pulse 86   Temp 36.8 C (98.3 F)   Resp (!) 23   Ht 1.549 m (5' 1)   Wt 66.6 kg (146 lb 14.4 oz)   SpO2 93%   BMI 27.76 kg/m     The patient is seen in bed ICU 26  Alert awake following commands  Oriented times  Pupils are equally reactive.  Extraocular movements intact  Face appears  Tongue is  Shoulders shrug symmetric  .  Able to raise arms against gravity holding my fingers with fair strength 4+/5 bilaterally  She is antigravity in both legs with fair distal resistance to dorsiflexors  .  Deep tendon reflexes are sluggish throughout  Plantar responses equivocal.  Coordination and gait deferred.  Light touch is intact distal lower extremity sensory examination is not reliable at this time.      Studies:  I have independently reviewed the following studies .    CT BRAIN WO IV CONTRAST performed on 07/15/2024 8:16 AM.               Small volume of acute hemorrhage in the subdural space overlying the left frontal lobe anteriorly and superiorly as detailed above. No significant change otherwise seen.  .  .  Results for orders placed or performed during the hospital encounter of 07/08/24 (from the past 24 hours)   CBC   Result Value Ref Range    WBC 7.5 3.7 - 11.0 x10^3/uL    RBC 2.60 (L) 3.85 - 5.22 x10^6/uL    HGB 7.0 (L) 11.5 - 16.0 g/dL    HCT 77.3 (L) 65.1 - 46.0 %    MCV 86.9 78.0 - 100.0 fL    MCH 26.9 26.0 - 32.0 pg    MCHC 31.0 31.0 - 35.5 g/dL    RDW-CV 83.9 (H) 88.4 - 15.5 %     PLATELETS 40 (L) 150 - 400 x10^3/uL    MPV 10.5 8.7 - 12.5 fL   PT/INR   Result Value Ref Range    PROTHROMBIN TIME 17.7 (H) 12.1 - 15.3 seconds    INR 1.42 (H) 0.86 - 1.14   COMPREHENSIVE METABOLIC PANEL, NON-FASTING   Result Value Ref Range    SODIUM 145 (H) 133 - 144 mmol/L    POTASSIUM 3.1 (L) 3.2 - 5.0 mmol/L    CHLORIDE 104 96 - 106 mmol/L    CO2 TOTAL 22 22 - 30 mmol/L    ANION GAP 19 (H) 7 - 18 mmol/L    BUN 44 (H) 8 - 23 mg/dL    CREATININE 9.00 (H) 0.50 - 0.90 mg/dL    ESTIMATED GFR 62 (L) >90 mL/min/1.44m^2    ALBUMIN 2.8 (L) 3.5 - 5.2 g/dL    CALCIUM 8.8 8.3 - 89.2 mg/dL    GLUCOSE 840 (H) 74 - 109 mg/dL    ALKALINE PHOSPHATASE 412 (H) 35 - 129 U/L    ALT (SGPT) 499 (H) 0 - 33 U/L    AST (SGOT) 834 (H) 0 - 32 U/L    BILIRUBIN TOTAL 2.6 (H) 0.2 - 1.2 mg/dL    PROTEIN TOTAL 5.3 (L) 6.4 - 8.3 g/dL   THYROID STIMULATING HORMONE WITH FREE T4 REFLEX   Result Value Ref Range    TSH 1.440 0.270 - 4.200 uIU/mL   PTT (PARTIAL THROMBOPLASTIN TIME)   Result Value Ref Range    APTT 28.6 22.4 - 34.8 seconds   FIBRINOGEN   Result Value Ref Range  FIBRINOGEN 459 251 - 576 mg/dL   H & H   Result Value Ref Range    HGB 8.9 (L) 11.5 - 16.0 g/dL    HCT 72.2 (L) 65.1 - 46.0 %   PRODUCT: PLATELETS - UNITS , 2 Units   Result Value Ref Range    Coding System [id number]     UNIT NUMBER [id number]     BLOOD COMPONENT TYPE PATHOGEN REDUCED PLASMA REDUCED1     UNIT DIVISION 00     UNIT DISPENSE STATUS ALLOCATED     TRANSFUSION STATUS OK TO TRANSFUSE     Product Code [id number]    BASIC METABOLIC PANEL   Result Value Ref Range    SODIUM 147 (H) 133 - 144 mmol/L    POTASSIUM 3.5 3.2 - 5.0 mmol/L    CHLORIDE 106 96 - 106 mmol/L    CO2 TOTAL 22 22 - 30 mmol/L    ANION GAP 19 (H) 7 - 18 mmol/L    CALCIUM 8.9 8.3 - 10.7 mg/dL    GLUCOSE 878 (H) 74 - 109 mg/dL    BUN 48 (H) 8 - 23 mg/dL    CREATININE 8.83 (H) 0.50 - 0.90 mg/dL    BUN/CREA RATIO 41     ESTIMATED GFR 52 (L) >90 mL/min/1.33m^2   MAGNESIUM    Result Value Ref Range     MAGNESIUM  2.0 1.6 - 2.4 mg/dL   CBC   Result Value Ref Range    WBC 8.5 3.7 - 11.0 x10^3/uL    RBC 2.82 (L) 3.85 - 5.22 x10^6/uL    HGB 7.9 (L) 11.5 - 16.0 g/dL    HCT 74.9 (L) 65.1 - 46.0 %    MCV 88.7 78.0 - 100.0 fL    MCH 28.0 26.0 - 32.0 pg    MCHC 31.6 31.0 - 35.5 g/dL    RDW-CV 83.5 (H) 88.4 - 15.5 %    PLATELETS 41 (L) 150 - 400 x10^3/uL    MPV 9.9 8.7 - 12.5 fL   PT/INR   Result Value Ref Range    PROTHROMBIN TIME 19.2 (H) 12.1 - 15.3 seconds    INR 1.59 (H) 0.86 - 1.14     No results found.       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)    History of breast cancer    Metastatic cancer to liver    Cigarette smoker    COVID-19    Acute blood loss anemia    Transaminitis    Malignant neoplasm of lung, unspecified laterality, unspecified part of lung (CMS HCC)    Pulmonary HTN (CMS HCC)    Pneumonia due to COVID-19 virus    Mood disorder (CMS HCC)    Anemia, unspecified type    Thickening of wall of gallbladder with pericholecystic fluid    Symptomatic anemia    Acute cholecystitis    NSVT (nonsustained ventricular tachycardia) (CMS HCC)    Heart failure, unspecified HF chronicity, unspecified heart failure type (CMS HCC)    Subdural hemorrhage (CMS HCC)       68 year old lady seen in neurology consult for findings of subdural hematoma after a fall overnight reportedly she hit her head while on the floor currently in ICU bed 26    A CT head  was showing a small volume of acute hemorrhage in the subdural space overlying the left frontal lobe anteriorly superiorly.    The patient was reporting mild headaches at the time of examination neurological examination as mentioned above currently nonfocal and stable.    Plan;    -Keep in ICU under observation with neuro checks q.2 hours if any change in examination stat head CT for  follow-up.\    -Hold anticoagulants and antiplatelets until cleared by Neurosurgery.    MRI brain without contrast when possible.    -PT/OT    Please call with question    -  Leni Marjory Lash, MD  Assistant Professor of Neurology  [facility]       This note was partially generated using MModal Fluency Direct system, and there may be some incorrect words, spellings, and punctuation that were not noted in checking the note before saving.            [1] No Known Allergies

## 2024-07-15 NOTE — Care Plan (Signed)
 [facility]   [address]  [place], [address]  (Office) [phone]    Rehabilitation Services    Clinical Swallowing Assessment          Patient Name: [name]  Date of Birth: 05/22/1956  Weight:  Weight: 66.6 kg (146 lb 14.4 oz)  Room/Bed: 26/A  Payor: [employer] MEDICARE / Plan: [employer] CHOICE PPO / Product Type: PPO /      07/15/24 1449   Rehab Session   SLP Visit Date 07/15/24   Total SLP Minutes: 25   General Information   Patient Profile Reviewed yes   Referring Physician Merlin Overland, NP   Mutuality/Individual Preferences   Anxieties, Fears or Concerns None voiced this day   Patient-Specific Goals (Include Timeframe) Swallow free fom aspiration   Cognitive   Level of Consciousness alert   SLP Clinical Impression   Assessment Functional oropharyngeal phase swallow   Dysphagia Goals, SLP   Date Established (Dysphagia Goal, SLP) 07/15/24   Time Frame (Dysphagia Goal, SLP) 2 wks   Activity (Dysphagia Goal, SLP) Swallow free from aspiration     Subjective: Pt is a 68 year old female who was brought to the ED after experiencing acute dyspnea and hypoxia at the cancer center. Pt was due to begin chemotherapy for recently diagnosed small cell lung ca with mets to the liver. Pt found to have COVID-19. PMH also includes COPD and CABG. Last night, pt got up independently and fell resulting in subdural hemorrhage. Pt had previously reported difficulty swallowing. Pt asleep upon entry. Easily awakened and agreeable to tx.    Objective: Pt positioned fully upright and given thin, pureed, and solid consistencies. Thin given via spoon, cup, and straw.    Assessment:  Oral Motor: Adequate ROM and strength.    Oral Phase: Adequate lip closure with no labial escape. Pt with timely chewing with solid and brisk tongue motion. Complete oral clearance.    Pharyngeal Phase: No coughing/choking or wet vocal quality observed suggesting a functional pharyngeal phase swallow, however silent  aspiration is not ruled out at bedside.    IMPRESSIONS: Bedside observations suggest a functional oro-pharyngeal phase swallow.    PLAN/RECOMMENDATIONS:  Consider a mechanical soft diet with thin liquids. May upgrade to regular as pt tolerates.  Full upright position with all oral intake and for 30 min post meals.  Monitor pt for signs/symptoms of aspiration.  Clinical correlation.  ST will follow for a tx feed and to monitor/assess cognition as indicated.    Therapy Frequency: 2-5 days per week    Rehab Potential: Rehab potential is good with speech intervention and patient participation.      POST ACUTE DISCHARGE RECOMMENDATION   Based on current diagnosis, functional performance prior to admission, and current functional performance, this patient does require continued SLP services in order to achieve significant functional improvements for Swallowing     Speech Intervention Minutes: SWALLOW EVALUATION 25 MINUTES    Thank you for inviting me to participate with you in the care of your patient.        Therapist:  Alfonso Creek, ST  07/15/2024  14:51

## 2024-07-15 NOTE — Assessment & Plan Note (Signed)
 Worsening metastatic liver disease likely causing elevated transaminases

## 2024-07-15 NOTE — Assessment & Plan Note (Signed)
 Pressure control  Continue losartan , metoprolol 

## 2024-07-15 NOTE — Assessment & Plan Note (Signed)
 Known history of small-cell carcinoma, planned for outpatient chemotherapy with carboplatin/etoposide/tecenteriq  Oncology is following, recommended to withhold current chemotherapy regimen until acute illness resolves

## 2024-07-15 NOTE — Care Plan (Signed)
 Problem: Wound  Goal: Optimal Coping  Outcome: Ongoing (see interventions/notes)  Goal: Optimal Functional Ability  Outcome: Ongoing (see interventions/notes)  Goal: Absence of Infection Signs and Symptoms  Outcome: Ongoing (see interventions/notes)  Intervention: Prevent or Manage Infection  Recent Flowsheet Documentation  Taken 07/14/2024 2000 by Winn MATSU, RN  Fever Reduction/Comfort Measures:   lightweight bedding   lightweight clothing  Goal: Improved Oral Intake  Outcome: Ongoing (see interventions/notes)  Goal: Optimal Pain Control and Function  Outcome: Ongoing (see interventions/notes)  Goal: Skin Health and Integrity  Outcome: Ongoing (see interventions/notes)  Intervention: Optimize Skin Protection  Recent Flowsheet Documentation  Taken 07/14/2024 2300 by Winn MATSU, RN  Pressure Reduction Techniques: Mobility is maximized  Pressure Reduction Devices: Repositioning wedges/pillows utilized  Taken 07/14/2024 2000 by Winn MATSU, RN  Pressure Reduction Techniques: Mobility is maximized  Pressure Reduction Devices: Repositioning wedges/pillows utilized  Goal: Optimal Wound Healing  Outcome: Ongoing (see interventions/notes)  Intervention: Promote Wound Healing  Recent Flowsheet Documentation  Taken 07/14/2024 2300 by Winn MATSU, RN  Pressure Reduction Techniques: Mobility is maximized  Pressure Reduction Devices: Repositioning wedges/pillows utilized  Taken 07/14/2024 2000 by Winn MATSU, RN  Pressure Reduction Techniques: Mobility is maximized  Pressure Reduction Devices: Repositioning wedges/pillows utilized     Problem: Adult Inpatient Plan of Care  Goal: Plan of Care Review  07/15/2024 0605 by Winn MATSU, RN  Outcome: Ongoing (see interventions/notes)  07/14/2024 1954 by Winn MATSU, RN  Outcome: Ongoing (see interventions/notes)  Goal: Patient-Specific Goal (Individualized)  07/15/2024 0605 by Winn MATSU, RN  Outcome: Ongoing (see interventions/notes)  07/14/2024 1954 by Winn MATSU, RN  Outcome: Ongoing (see  interventions/notes)  Goal: Absence of Hospital-Acquired Illness or Injury  07/15/2024 0605 by Winn MATSU, RN  Outcome: Ongoing (see interventions/notes)  07/14/2024 1954 by Winn MATSU, RN  Outcome: Ongoing (see interventions/notes)  Intervention: Identify and Manage Fall Risk  Recent Flowsheet Documentation  Taken 07/15/2024 0400 by Winn MATSU, RN  Safety Promotion/Fall Prevention: activity supervised  Taken 07/15/2024 0200 by Winn MATSU, RN  Safety Promotion/Fall Prevention: activity supervised  Taken 07/15/2024 0000 by Winn MATSU, RN  Safety Promotion/Fall Prevention: activity supervised  Taken 07/14/2024 2200 by Winn MATSU, RN  Safety Promotion/Fall Prevention: activity supervised  Taken 07/14/2024 2000 by Winn MATSU, RN  Safety Promotion/Fall Prevention: activity supervised  Intervention: Prevent Skin Injury  Recent Flowsheet Documentation  Taken 07/15/2024 0400 by Winn MATSU, RN  Body Position: supine, head elevated  Taken 07/15/2024 0200 by Winn MATSU, RN  Body Position: supine, head elevated  Taken 07/15/2024 0000 by Winn MATSU, RN  Body Position: side lying, right  Taken 07/14/2024 2300 by Winn MATSU, RN  Skin Protection: adhesive use limited  Taken 07/14/2024 2200 by Winn MATSU, RN  Body Position: supine, head elevated  Taken 07/14/2024 2000 by Winn MATSU, RN  Body Position: supine, head elevated  Skin Protection: adhesive use limited  Intervention: Prevent and Manage VTE (Venous Thromboembolism) Risk  Recent Flowsheet Documentation  Taken 07/14/2024 2000 by Winn MATSU, RN  Sequential Compression Device (SCDs):   Sequential compression device initiated   Sequential compression device off   patient refused intervention  VTE Prevention/Management: ambulation promoted  Goal: Optimal Comfort and Wellbeing  Outcome: Ongoing (see interventions/notes)  Goal: Rounds/Family Conference  Outcome: Ongoing (see interventions/notes)

## 2024-07-15 NOTE — Assessment & Plan Note (Signed)
 Patient developed nonsustained V-tach last night   S/p amiodarone  drip   Continue amiodarone   200 mg p.o. daily   Evaluated by Cardiology, patient is not a candidate for anticoagulation due to anemia and thrombocytopenia  Appreciate cardiology input

## 2024-07-15 NOTE — Progress Notes (Signed)
 [facility] Progress Note    [name]       68 y.o.       Date of service: 07/15/2024  Date of Admission:  07/08/2024    Hospital Day:  LOS: 7 days   CC: Acute hypoxic respiratory failure (CMS HCC)    Subjective:  Nursing reported that patient had a fall overnight and she reported that she  hit her head.  Stat CT head was done this morning which showed small volume of acute hemorrhage in the subdural space overlying the left frontal lobe anteriorly and superiorly .  Patient was seen and examined at bedside this morning, daughter at bedside.  Patient has mild headache which is chronic, denied blurry vision, slurred speech, focal muscle weakness.  She was nauseous earlier but denied at present, denied vomiting.  Patient did not have any further bleeding from the abdominal laparoscopic incision.  I discussed with ICU team and transferred patient to ICU for close monitoring.  Stat CBC done showed hemoglobin of 7.9 and platelet count 41 K, INR 1.5.  Ordered 2 units platelets, given the acute hemorrhage will aim for platelet to be around 100 K. I discussed with Hematology.    Objective:   Filed Vitals:    07/15/24 1000 07/15/24 1015 07/15/24 1030 07/15/24 1045   BP: (!) 105/92 109/60 124/62 100/63   Pulse: 86 87 87 87   Resp: (!) 22 (!) 24 (!) 22 20   Temp:  36.8 C (98.3 F)     SpO2: 92% 94% 93% 92%     Oxygen Device  SpO2: 92 %  Flow (L/min) (Oxygen Therapy): 2   Flow (L/Min) (NICU-PICU): 2 LPM    albuterol  (PROVENTIL ) 2.5 mg / 3 mL (0.083%) neb solution, 2.5 mg, Nebulization, 4x/day  aluminum-magnesium  hydroxide-simethicone (MAG-AL PLUS) 200-200-20 mg per 5 mL oral liquid, 30 mL, Oral, Q4H PRN  amiodarone  (CORDARONE ) tablet, 200 mg, Oral, Daily with Breakfast  anastrozole  (ARIMIDEX ) tablet, 1 mg, Oral, Daily  budesonide (PULMICORT RESPULES) 0.5 mg/2 mL nebulizer suspension, 1 mg, Nebulization, 2x/day   And  arformoterol (BROVANA) 15 mcg/2 mL nebulizer solution, 15 mcg,  Nebulization, 2x/day  atorvastatin  (LIPITOR) tablet, 40 mg, Oral, Daily  benzonatate (TESSALON) capsule, 100 mg, Oral, Q8H PRN  cefpodoxime (VANTIN) tablet, 400 mg, Oral, 2x/day-Food  cholecalciferol  (VITAMIN D3) 1000 unit (25 mcg) tablet, 1,000 Units, Oral, Daily  clonazePAM  (klonoPIN ) tablet, 0.5 mg, Oral, 2x/day PRN  NS 250 mL flush bag, , Intravenous, Q15 Min PRN   And  D5W 250 mL flush bag, , Intravenous, Q15 Min PRN  dexAMETHasone  (DECADRON ) tablet 6 mg, 6 mg, Oral, Daily  diphenhydrAMINE (BENADRYL) capsule, 25 mg, Oral, HS PRN  DULoxetine  (CYMBALTA ) delayed release capsule, 60 mg, Oral, Daily  [Held by provider] enoxaparin PF (LOVENOX) 40 mg/0.4 mL SubQ injection, 40 mg, Subcutaneous, Q24H  fluticasone  (FLONASE ) 50 mcg per spray nasal spray, 1 Spray, Each Nostril, Daily PRN  folic acid  (FOLVITE ) tablet, 1 mg, Oral, Daily  gabapentin  (NEURONTIN ) capsule, 400 mg, Oral, 3x/day  HYDROcodone -acetaminophen  (NORCO) 5-325 mg per tablet, 1 Tablet, Oral, Q6H PRN  HYDROmorphone  (DILAUDID ) 0.5 mg/0.5 mL injection, 0.5 mg, Intravenous, Q4H PRN  losartan  (COZAAR ) tablet, 25 mg, Oral, Daily  magnesium  hydroxide (MILK OF MAGNESIA) 400mg  per 5mL oral liquid, 15 mL, Oral, 4x/day PRN  metoprolol  tartrate (LOPRESSOR ) tablet, 25 mg, Oral, 2x/day  multivitamin-minerals-iron  oral liquid, 15 mL, Oral, Daily  NS bolus infusion 40 mL, 40 mL, Intravenous, Once PRN  NS flush syringe, 3 mL, Intracatheter, Q8HRS  NS flush syringe, 3 mL, Intracatheter, Q1H PRN  OLANZapine  (zyPREXA ) tablet, 5 mg, Oral, NIGHTLY  omega-3 fatty acids (LOVAZA ) capsule, 1 g, Oral, Daily  ondansetron  (ZOFRAN ) 2 mg/mL injection, 4 mg, Intravenous, Q6H PRN  pantoprazole  (PROTONIX ) delayed release tablet, 40 mg, Oral, Daily Before Lunch  polyethylene glycol (MIRALAX) oral packet, 17 g, Oral, Daily  prochlorperazine  (COMPAZINE ) tablet, 10 mg, Oral, 3x/day PRN  traZODone  (DESYREL ) tablet, 150 mg, Oral, NIGHTLY        Physical Exam:  General: No acute  distress  Cardiac: Regular rate and rhythm, no murmur auscultated.  Respiratory: Clear to auscultation bilaterally , no wheeze, rhonchi  Abdomen:soft, right lower abdomen laparoscopic incisions covered with clean dry gauze, expected minimal tenderness present in right upper quadrant, no organomegaly  Neurological: Alert and Orient X 3; No gross focal neurological deficits  Extremities: No peripheral edema noted       Results for orders placed or performed during the hospital encounter of 07/08/24 (from the past 24 hours)   CBC    Collection Time: 07/14/24  1:31 PM   Result Value Ref Range    WBC 7.5 3.7 - 11.0 x10^3/uL    RBC 2.60 (L) 3.85 - 5.22 x10^6/uL    HGB 7.0 (L) 11.5 - 16.0 g/dL    HCT 77.3 (L) 65.1 - 46.0 %    MCV 86.9 78.0 - 100.0 fL    MCH 26.9 26.0 - 32.0 pg    MCHC 31.0 31.0 - 35.5 g/dL    RDW-CV 83.9 (H) 88.4 - 15.5 %    PLATELETS 40 (L) 150 - 400 x10^3/uL    MPV 10.5 8.7 - 12.5 fL   PT/INR    Collection Time: 07/14/24  1:31 PM   Result Value Ref Range    PROTHROMBIN TIME 17.7 (H) 12.1 - 15.3 seconds    INR 1.42 (H) 0.86 - 1.14    Narrative    In the setting of warfarin therapy, a moderate-intensity INR goal range is 2.0 to 3.0 and a high-intensity INR goal range is 2.5 to 3.5.    INR is ONLY validated to determine the level of anticoagulation with vitamin K antagonists (warfarin). Other factors may elevate the INR including but not limited to direct oral anticoagulants (DOACs), liver dysfunction, vitamin K deficiency, DIC, factor deficiencies, and factor inhibitors.   COMPREHENSIVE METABOLIC PANEL, NON-FASTING    Collection Time: 07/14/24  1:31 PM   Result Value Ref Range    SODIUM 145 (H) 133 - 144 mmol/L    POTASSIUM 3.1 (L) 3.2 - 5.0 mmol/L    CHLORIDE 104 96 - 106 mmol/L    CO2 TOTAL 22 22 - 30 mmol/L    ANION GAP 19 (H) 7 - 18 mmol/L    BUN 44 (H) 8 - 23 mg/dL    CREATININE 9.00 (H) 0.50 - 0.90 mg/dL    ESTIMATED GFR 62 (L) >90 mL/min/1.27m^2    ALBUMIN 2.8 (L) 3.5 - 5.2 g/dL    CALCIUM 8.8 8.3 -  89.2 mg/dL    GLUCOSE 840 (H) 74 - 109 mg/dL    ALKALINE PHOSPHATASE 412 (H) 35 - 129 U/L    ALT (SGPT) 499 (H) 0 - 33 U/L    AST (SGOT) 834 (H) 0 - 32 U/L    BILIRUBIN TOTAL 2.6 (H) 0.2 - 1.2 mg/dL    PROTEIN TOTAL 5.3 (L) 6.4 - 8.3 g/dL   THYROID STIMULATING HORMONE WITH FREE  T4 REFLEX    Collection Time: 07/14/24  1:31 PM   Result Value Ref Range    TSH 1.440 0.270 - 4.200 uIU/mL   PTT (PARTIAL THROMBOPLASTIN TIME)    Collection Time: 07/14/24  1:31 PM   Result Value Ref Range    APTT 28.6 22.4 - 34.8 seconds   FIBRINOGEN    Collection Time: 07/14/24  1:31 PM   Result Value Ref Range    FIBRINOGEN 459 251 - 576 mg/dL   H & H    Collection Time: 07/14/24 11:16 PM   Result Value Ref Range    HGB 8.9 (L) 11.5 - 16.0 g/dL    HCT 72.2 (L) 65.1 - 46.0 %   PRODUCT: PLATELETS - UNITS , 2 Units    Collection Time: 07/15/24  9:00 AM   Result Value Ref Range    Coding System [id number]     UNIT NUMBER [id number]     BLOOD COMPONENT TYPE PATHOGEN REDUCED PLASMA REDUCED1     UNIT DIVISION 00     UNIT DISPENSE STATUS ALLOCATED     TRANSFUSION STATUS OK TO TRANSFUSE     Product Code [id number]    BASIC METABOLIC PANEL    Collection Time: 07/15/24  9:23 AM   Result Value Ref Range    SODIUM 147 (H) 133 - 144 mmol/L    POTASSIUM 3.5 3.2 - 5.0 mmol/L    CHLORIDE 106 96 - 106 mmol/L    CO2 TOTAL 22 22 - 30 mmol/L    ANION GAP 19 (H) 7 - 18 mmol/L    CALCIUM 8.9 8.3 - 10.7 mg/dL    GLUCOSE 878 (H) 74 - 109 mg/dL    BUN 48 (H) 8 - 23 mg/dL    CREATININE 8.83 (H) 0.50 - 0.90 mg/dL    BUN/CREA RATIO 41     ESTIMATED GFR 52 (L) >90 mL/min/1.90m^2   MAGNESIUM     Collection Time: 07/15/24  9:23 AM   Result Value Ref Range    MAGNESIUM  2.0 1.6 - 2.4 mg/dL   CBC    Collection Time: 07/15/24  9:23 AM   Result Value Ref Range    WBC 8.5 3.7 - 11.0 x10^3/uL    RBC 2.82 (L) 3.85 - 5.22 x10^6/uL    HGB 7.9 (L) 11.5 - 16.0 g/dL    HCT 74.9 (L) 65.1 - 46.0 %    MCV 88.7 78.0 - 100.0 fL    MCH 28.0 26.0 - 32.0 pg    MCHC 31.6 31.0 - 35.5 g/dL     RDW-CV 83.5 (H) 88.4 - 15.5 %    PLATELETS 41 (L) 150 - 400 x10^3/uL    MPV 9.9 8.7 - 12.5 fL   PT/INR    Collection Time: 07/15/24  9:23 AM   Result Value Ref Range    PROTHROMBIN TIME 19.2 (H) 12.1 - 15.3 seconds    INR 1.59 (H) 0.86 - 1.14    Narrative    In the setting of warfarin therapy, a moderate-intensity INR goal range is 2.0 to 3.0 and a high-intensity INR goal range is 2.5 to 3.5.    INR is ONLY validated to determine the level of anticoagulation with vitamin K antagonists (warfarin). Other factors may elevate the INR including but not limited to direct oral anticoagulants (DOACs), liver dysfunction, vitamin K deficiency, DIC, factor deficiencies, and factor inhibitors.         Micro: No results found for any visits on 07/08/24 (from the  past 96 hours).    Radiology:    Results for orders placed or performed during the hospital encounter of 07/08/24 (from the past 24 hours)   CT BRAIN WO IV CONTRAST     Status: Abnormal    Narrative    Shanikia JEAN Fawaz    CT BRAIN WO IV CONTRAST performed on 07/15/2024 8:16 AM.    INDICATION:  Fall   Additional History:  fall, nausea, weakness    TECHNIQUE:  Unenhanced CT head with axial, coronal, and sagittal multiplanar reformations. Dose modulation, automated exposure control, and/or iterative reconstruction were used for dose reduction.    COMPARISON: June 14, 2024    FINDINGS:  There is a small amount of acute blood products in the subdural space overlying the left frontal lobe anteriorly and inferiorly. This measures 6 mm in thickness. A small collection of blood in the subdural space along the high convexity of the left frontal lobe is also seen. There is very mild mass effect on the left frontal lobe with no significant effacement of the ventricular system, edema or midline shift.    Elsewhere, the brain, ventricles and subarachnoid spaces are within normal limits. There is no evidence of acute large vessel infarct. No acute blood products are seen elsewhere.  The calvarium appears intact.              Impression    Small volume of acute hemorrhage in the subdural space overlying the left frontal lobe anteriorly and superiorly as detailed above. No significant change otherwise seen.  .  .  .  s/d/g    A Critical Red actionable finding has been sent via the [employer] Actionable Findings application on 07/15/2024 8:26 AM, Message ID [id number]. Receipt of this communication will be communicated to [facility] RADIOLOGY STAFF or responsible provider and will be documented in [employer] Actionable Findings System upon receiving the acknowledgement.    A Critical Red actionable finding has been sent via the [employer] Actionable Findings application on 07/15/2024 8:26 AM, Message ID [id number]. Receipt of this communication will be communicated to [facility] RADIOLOGY STAFF or responsible provider and will be documented in [employer] Actionable Findings System upon receiving the acknowledgement.      Radiologist location ID: [id number]         Assessment/ Plan:   Assessment & Plan  Subdural hemorrhage (CMS HCC)  S/p fall last night  Stat CT head done this morning showed small volume of acute hemorrhage in the subdural space overlying the left frontal lobe anteriorly and superiorly  I discussed with ICU team and transferred patient to ICU for close monitoring.  Stat CBC done showed hemoglobin of 7.9 and platelet count 41 K, INR 1.5.    Ordered 2 units platelets, given the acute subdural hemorrhage, will aim for platelet to be around 100 K, discussed with Hematology  Neurology and neurosurgery consult    Acute hypoxic respiratory failure (CMS HCC)  Pneumonia due to COVID-19 virus  Likely due to COVID and possible superimposed bacterial pneumonia  Currently on 2 L nasal cannula  Respiratory panel positive for COVID  CT angiogram did not show any evidence pulmonary embolism although marginal increase in the size of infrahilar mass, right hilar and mediastinal  lymphadenopathy.  Continue p.o. Decadron    IV Rocephin has been de-escalated to p.o. cefpodoxime  Continue Brovana and Pulmicort   Pulmonary is following, appreciate input    Acute cholecystitis  Thickening of wall of gallbladder with pericholecystic fluid  S/p laparoscopic cholecystectomy  on 10/02  Has developed gallbladder fossa hematoma and hemoperitoneum  Tolerating regular diet   Appreciate General surgery input     Acute blood loss anemia  Acute blood loss anemia or acute on chronic anemia, and mild bleeding from 1 of the laparoscopic incisions  Likely secondary to acute bleeding and underlying metastatic disease  S/p vitamin K 10 mg IV x1 dose on 10/03,   S/p 2 units PRBC transfusion on 10/3  She also received 1 units platelets and 2 units FFP on 10/04   CT abdomen done on 10/03 evening showed hematoma associated with the gallbladder fossa with fluid tracking inferiorly, moderate volume hemoperitoneum  CT abdomen repeated on 10/04 due to patient complaining of worsening abdomen pain and distention, showed stable hematoma in gallbladder fossa with slight decrease in volume of hemoperitoneum  S/p suture of bleeding laparoscopic incision by General surgery on 10/05 and bleeding has been controlled   S/p 1 FFP and 1 unit PRBC on 10/05   Anemia panel not suggestive of iron  deficiency  Stool occult blood   Monitor CBC and transfuse for hemoglobin less than 7  General surgery and Hematology are following, appreciate input   NSVT (nonsustained ventricular tachycardia) (CMS HCC)  Patient developed nonsustained V-tach last night   S/p amiodarone  drip   Continue amiodarone   200 mg p.o. 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)  Malignant neoplasm of lung, unspecified laterality, unspecified part of lung (CMS HCC)  Known history of small-cell carcinoma, planned  for outpatient chemotherapy with carboplatin/etoposide/tecenteriq  Oncology is following, recommended to withhold current chemotherapy regimen until acute illness resolves     CAD (coronary artery disease)  History of CABG x1   Has been on hold due to bleeding  Continue atorvastatin , metoprolol     Hypertension  Pressure control  Continue losartan , metoprolol   Paroxysmal atrial fibrillation (CMS HCC)  Rate controlled, continue metoprolol , not on anticoagulation  History of breast cancer  S/p mastectomy  Metastatic cancer to liver  Worsening metastatic liver disease likely causing elevated transaminases  Transaminitis  Likely secondary to liver metastasis  Pulmonary HTN (CMS HCC)  Pulmonary is following  Mood disorder (CMS HCC)  Continue trazodone   Cigarette smoker     Deep vein thrombosis prophylaxis-SCD        Candyce Salles, MD

## 2024-07-15 NOTE — Assessment & Plan Note (Signed)
 S/p laparoscopic cholecystectomy on 10/02  Has developed gallbladder fossa hematoma and hemoperitoneum  Tolerating regular diet   Appreciate General surgery input

## 2024-07-15 NOTE — Assessment & Plan Note (Signed)
 Likely secondary to liver metastasis

## 2024-07-15 NOTE — Assessment & Plan Note (Signed)
 History of CABG x1   Has been on hold due to bleeding  Continue atorvastatin , metoprolol 

## 2024-07-15 NOTE — Assessment & Plan Note (Signed)
 Rate controlled, continue metoprolol , not on anticoagulation

## 2024-07-15 NOTE — Assessment & Plan Note (Signed)
 Pulmonary is following.

## 2024-07-15 NOTE — Assessment & Plan Note (Signed)
 Continue trazodone

## 2024-07-15 NOTE — Consults (Signed)
 [facility]  Consultation        Date of Service:  07/15/2024  [name] y.o. female  Date of Admission:  07/08/2024  Date of Birth:  05-Jun-1956      Reason for Consult:  Subdural hematoma    HPI: [name] is a 68 y.o., White female who presents with other medical issues but sustained a fall during a hospitalization.  CT scan of the head did reveal evidence of a left frontal subdural hematoma for which neurosurgical consultation has been requested.  The patient does endorse some headache but reports she is feeling better now.  Denies new lateralizing deficits such as numbness tingling or weakness of the extremities.    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  She also has a history of small-cell lung cancer with liver metastasis.    History:    Past Medical:    Past Medical History:   Diagnosis Date    Abdominal pain     Cancer (CMS HCC)     Chest pain     Chronic lung disease     Coronary artery disease     Dyslipidemia     Essential hypertension     Heart disease     History of percutaneous coronary intervention     History of stress test     Mixed hyperlipidemia     Unspecified chronic bronchitis      Past Surgical:    Past Surgical History:   Procedure Laterality Date    CARDIAC CATHETERIZATION      CORONARY ANGIOPLASTY      CORONARY ARTERY STENT PLACEMENT      HX BACK SURGERY      HX CORONARY ARTERY BYPASS GRAFT      HX MASTECTOMY, SIMPLE Bilateral     HX TUBAL LIGATION      NECK SURGERY       Family:    Family Medical History:       Problem Relation (Age of Onset)    Arthritis Mother, Maternal Grandmother    Cancer Father, Other    Heart Attack Father, Other    Heart Disease Brother, Other    Hypertension (High Blood Pressure) Mother, Father, Other    Pacemaker Mother          Social:   reports that she has been smoking cigarettes. She started smoking about 52 years ago. She has a 26.4 pack-year smoking history. She has  never used smokeless tobacco. She reports current alcohol use. She reports current drug use. Drug: Marijuana.    Allergies[1]  Medications Prior to Admission       Prescriptions    anastrozole  (ARIMIDEX ) 1 mg Oral Tablet    Take 1 Tablet (1 mg total) by mouth Daily    aspirin  81 mg Oral Tablet, Chewable    Chew 1 Tablet (81 mg total) Once a day    atorvastatin  (LIPITOR) 40 mg Oral Tablet    Take 1 Tablet (40 mg total) by mouth Daily    cholecalciferol , vitamin D3, 25 mcg (1,000 unit) Oral Tablet    Take 1 Tablet (1,000 Units total) by mouth Daily    clonazePAM  (KLONOPIN ) 0.5 mg Oral Tablet    Take 1 Tablet (0.5 mg total) by mouth Twice per day as needed for Other (ANXIETY)    DULoxetine  (CYMBALTA  DR) 60 mg Oral Capsule, Delayed Release(E.C.)    Take 1 Capsule (60 mg total)  by mouth Daily    fluticasone  propion-salmeteroL (ADVAIR ) 250-50 mcg/dose Inhalation oral diskus inhaler    Take 1 Puff (1 Inhalation total) by inhalation Twice daily    fluticasone  propionate (FLONASE ) 50 mcg/actuation Nasal Spray, Suspension    1 Spray Once per day as needed for Other (CONGESTION)    gabapentin  (NEURONTIN ) 400 mg Oral Capsule    Take 1 Capsule (400 mg total) by mouth Three times a day    losartan  (COZAAR ) 25 mg Oral Tablet    Take 1 Tablet (25 mg total) by mouth Daily    metoprolol  tartrate (LOPRESSOR ) 25 mg Oral Tablet    Take 1 Tablet (25 mg total) by mouth Twice daily    OLANZapine  (ZYPREXA ) 5 mg Oral Tablet    Take 1 Tablet (5 mg total) by mouth Every night for 3 days    oxyCODONE -acetaminophen  (PERCOCET) 7.5-325 mg Oral Tablet    Take 1 Tablet by mouth Every 4 hours as needed for Pain    prochlorperazine  (COMPAZINE ) 10 mg Oral Tablet    Take 1 Tablet (10 mg total) by mouth Three times a day as needed for Nausea/Vomiting for up to 30 days    promethazine  (PHENERGAN ) 25 mg Oral Tablet    Take 1 Tablet (25 mg total) by mouth Every 8 hours as needed for Nausea/Vomiting    traZODone  (DESYREL ) 150 mg Oral Tablet    Take 1 Tablet  (150 mg total) by mouth Every night          albuterol  (PROVENTIL ) 2.5 mg / 3 mL (0.083%) neb solution, 2.5 mg, Nebulization, 4x/day  aluminum-magnesium  hydroxide-simethicone (MAG-AL PLUS) 200-200-20 mg per 5 mL oral liquid, 30 mL, Oral, Q4H PRN  amiodarone  (CORDARONE ) tablet, 200 mg, Oral, Daily with Breakfast  anastrozole  (ARIMIDEX ) tablet, 1 mg, Oral, Daily  budesonide (PULMICORT RESPULES) 0.5 mg/2 mL nebulizer suspension, 1 mg, Nebulization, 2x/day   And  arformoterol (BROVANA) 15 mcg/2 mL nebulizer solution, 15 mcg, Nebulization, 2x/day  atorvastatin  (LIPITOR) tablet, 40 mg, Oral, Daily  benzonatate (TESSALON) capsule, 100 mg, Oral, Q8H PRN  cefpodoxime (VANTIN) tablet, 400 mg, Oral, 2x/day-Food  cholecalciferol  (VITAMIN D3) 1000 unit (25 mcg) tablet, 1,000 Units, Oral, Daily  clonazePAM  (klonoPIN ) tablet, 0.5 mg, Oral, 2x/day PRN  NS 250 mL flush bag, , Intravenous, Q15 Min PRN   And  D5W 250 mL flush bag, , Intravenous, Q15 Min PRN  dexAMETHasone  (DECADRON ) tablet 6 mg, 6 mg, Oral, Daily  diphenhydrAMINE (BENADRYL) capsule, 25 mg, Oral, HS PRN  DULoxetine  (CYMBALTA ) delayed release capsule, 60 mg, Oral, Daily  [Held by provider] enoxaparin PF (LOVENOX) 40 mg/0.4 mL SubQ injection, 40 mg, Subcutaneous, Q24H  fluticasone  (FLONASE ) 50 mcg per spray nasal spray, 1 Spray, Each Nostril, Daily PRN  folic acid  (FOLVITE ) tablet, 1 mg, Oral, Daily  gabapentin  (NEURONTIN ) capsule, 400 mg, Oral, 3x/day  HYDROcodone -acetaminophen  (NORCO) 5-325 mg per tablet, 1 Tablet, Oral, Q6H PRN  HYDROmorphone  (DILAUDID ) 0.5 mg/0.5 mL injection, 0.5 mg, Intravenous, Q4H PRN  losartan  (COZAAR ) tablet, 25 mg, Oral, Daily  magnesium  hydroxide (MILK OF MAGNESIA) 400mg  per 5mL oral liquid, 15 mL, Oral, 4x/day PRN  metoprolol  tartrate (LOPRESSOR ) tablet, 25 mg, Oral, 2x/day  multivitamin-minerals-iron  oral liquid, 15 mL, Oral, Daily  NS bolus infusion 40 mL, 40 mL, Intravenous, Once PRN  NS flush syringe, 3 mL, Intracatheter, Q8HRS  NS flush  syringe, 3 mL, Intracatheter, Q1H PRN  OLANZapine  (zyPREXA ) tablet, 5 mg, Oral, NIGHTLY  omega-3 fatty acids (LOVAZA ) capsule, 1 g, Oral, Daily  ondansetron  (ZOFRAN ) 2 mg/mL injection, 4 mg, Intravenous, Q6H PRN  pantoprazole  (PROTONIX ) delayed release tablet, 40 mg, Oral, Daily Before Lunch  polyethylene glycol (MIRALAX) oral packet, 17 g, Oral, Daily  prochlorperazine  (COMPAZINE ) tablet, 10 mg, Oral, 3x/day PRN  traZODone  (DESYREL ) tablet, 150 mg, Oral, NIGHTLY        ROS:   Review of Systems    All other systems negative unless marked.    Exam:  Vitals:    07/15/24 1430 07/15/24 1445 07/15/24 1500 07/15/24 1554   BP: (!) 103/54 (!) 119/59 116/62    Pulse: 84 81 83    Resp: (!) 21 18 (!) 23    Temp: 36.8 C (98.3 F)      SpO2: 93% 90% 91% 94%   Weight:       Height:       BMI:                  Physical Exam  Constitutional: [name] oriented to person, place, and time, and in no distress.   Head: Normocephalic and atraumatic.   Mouth/Throat: Oropharynx is clear and moist.   Eyes: Pupils are equal, round, and reactive to light. Conjunctivae and EOM are normal.   Neck: Neck supple.   Cardiovascular: S1, S2, Normal rate, regular rhythm.  Pulmonary/Chest: Breath sounds normal. No respiratory distress.    Abdominal: Soft. Bowel sounds are normal.  no distension. no abdominal tenderness.   Musculoskeletal: Normal range of motion.      General: No deformity or edema.   Neurological: [name] alert and oriented to person, place, and time. CN II-XII grossly normal, no focal deficit  Skin: Skin is warm and dry. No erythema.       Labs:     Results for orders placed or performed during the hospital encounter of 07/08/24 (from the past 24 hours)   H & H    Collection Time: 07/14/24 11:16 PM   Result Value Ref Range    HGB 8.9 (L) 11.5 - 16.0 g/dL    HCT 72.2 (L) 65.1 - 46.0 %   PRODUCT: PLATELETS - UNITS , 2 Units    Collection Time: 07/15/24  9:00 AM   Result Value Ref Range    Coding System [id number]     UNIT NUMBER  [id number]     BLOOD COMPONENT TYPE PATHOGEN REDUCED PLASMA REDUCED1     UNIT DIVISION 00     UNIT DISPENSE STATUS ISSUED     TRANSFUSION STATUS OK TO TRANSFUSE     Product Code E8341V00     Coding System [id number]     UNIT NUMBER [id number]     BLOOD COMPONENT TYPE PATHOGEN REDUCED PLASMA REDUCED3     UNIT DIVISION 00     UNIT DISPENSE STATUS ISSUED     TRANSFUSION STATUS OK TO TRANSFUSE     Product Code [id number]    BASIC METABOLIC PANEL    Collection Time: 07/15/24  9:23 AM   Result Value Ref Range    SODIUM 147 (H) 133 - 144 mmol/L    POTASSIUM 3.5 3.2 - 5.0 mmol/L    CHLORIDE 106 96 - 106 mmol/L    CO2 TOTAL 22 22 - 30 mmol/L    ANION GAP 19 (H) 7 - 18 mmol/L    CALCIUM 8.9 8.3 - 10.7 mg/dL    GLUCOSE 878 (H) 74 - 109 mg/dL    BUN 48 (H) 8 - 23 mg/dL    CREATININE 8.83 (  H) 0.50 - 0.90 mg/dL    BUN/CREA RATIO 41     ESTIMATED GFR 52 (L) >90 mL/min/1.28m^2   MAGNESIUM     Collection Time: 07/15/24  9:23 AM   Result Value Ref Range    MAGNESIUM  2.0 1.6 - 2.4 mg/dL   CBC    Collection Time: 07/15/24  9:23 AM   Result Value Ref Range    WBC 8.5 3.7 - 11.0 x10^3/uL    RBC 2.82 (L) 3.85 - 5.22 x10^6/uL    HGB 7.9 (L) 11.5 - 16.0 g/dL    HCT 74.9 (L) 65.1 - 46.0 %    MCV 88.7 78.0 - 100.0 fL    MCH 28.0 26.0 - 32.0 pg    MCHC 31.6 31.0 - 35.5 g/dL    RDW-CV 83.5 (H) 88.4 - 15.5 %    PLATELETS 41 (L) 150 - 400 x10^3/uL    MPV 9.9 8.7 - 12.5 fL   PT/INR    Collection Time: 07/15/24  9:23 AM   Result Value Ref Range    PROTHROMBIN TIME 19.2 (H) 12.1 - 15.3 seconds    INR 1.59 (H) 0.86 - 1.14    Narrative    In the setting of warfarin therapy, a moderate-intensity INR goal range is 2.0 to 3.0 and a high-intensity INR goal range is 2.5 to 3.5.    INR is ONLY validated to determine the level of anticoagulation with vitamin K antagonists (warfarin). Other factors may elevate the INR including but not limited to direct oral anticoagulants (DOACs), liver dysfunction, vitamin K deficiency, DIC, factor deficiencies, and factor  inhibitors.        Imaging Studies:    CT BRAIN WO IV CONTRAST   Final Result   Abnormal   Small volume of acute hemorrhage in the subdural space overlying the left frontal lobe anteriorly and superiorly as detailed above. No significant change otherwise seen.   .   .   .   s/d/g      A Critical Red actionable finding has been sent via the [employer] Actionable Findings application on 07/15/2024 8:26 AM, Message ID [id number]. Receipt of this communication will be communicated to [facility] ED RADIOLOGY STAFF or responsible provider and will be documented in [employer] Actionable Findings System upon receiving the acknowledgement.      A Critical Red actionable finding has been sent via the [employer] Actionable Findings application on 07/15/2024 8:26 AM, Message ID [id number]. Receipt of this communication will be communicated to [facility] RADIOLOGY STAFF or responsible provider and will be documented in [employer] Actionable Findings System upon receiving the acknowledgement.         Radiologist location ID: [id number]         CT ABDOMEN PELVIS WO IV CONTRAST   Final Result      1. Grossly stable hematoma in the gallbladder fossa with slight decrease in volume of hemoperitoneum.   2. Additional stable findings as above.            Radiologist location ID: [id number]         CT ABDOMEN PELVIS WO IV CONTRAST   Final Result      1. Hematoma associated with the gallbladder fossa with fluid tracking inferiorly.       2. Moderate volume hemoperitoneum, more so blood lying in the dependent pelvis, though also around the liver and spleen and pericolic gutters.      3. Other findings as described above.  Radiologist location ID: [id number]         MRI MRCP WO CONTRAST   Final Result      * Mild extra hepatic bile duct dilatation as noted on CT 07/08/2024, no bile duct stone.      * No gallstones, there is pericholecystic fluid as well as fluid adjacent to the liver.      * Diffuse heterogeneous appearance to the liver  as described above, this could certainly represent findings associated with acute hepatitis, differential considerations include regenerative nodularity in chronic liver disease, metastatic disease/lymphoma, and possibly sarcoid               Radiologist location ID: [id number]            Radiologist location ID: [id number]         US  RT UPPER QUADRANT   Final Result   Gallbladder wall thickening with pericholecystic fluid consistent with acute cholecystitis.      Dilated common bile duct without distal obstructing lesion identified.      Hepatomegaly with hepatic steatosis.      Trace ascites.               Radiologist location ID: [id number]         CT ANGIO CHEST FOR PULMONARY EMBOLUS W IV CONTRAST   Final Result      1. No evidence of pulmonary embolism.   2. Marginal increase in the size of the right infrahilar mass, right hilar and mediastinal lymphadenopathy compared to the prior study.   3. Other chronic findings as described above.            Radiologist location ID: [id number]         CT ABDOMEN PELVIS W IV CONTRAST   Final Result      1. Hepatomegaly, hepatic steatosis and hepatic lesions, not greatly changed and new pericholecystic fluid now present.   2. Multiple nonobstructing bilateral renal calculi.   3. New free fluid in the pelvis.         Radiologist location ID: [id number]         XR AP MOBILE CHEST   Final Result      1. Right hilar adenopathy.      2. Vague pulmonary opacities could be mild pneumonia or atelectasis.            Radiologist location ID: [id number]           I have reviewed a CT of the head performed this morning at 8:00 a.m..  This shows evidence of a 6 mm left frontal subdural hematoma with minimal mass effect.  No significant midline shift.  No evidence of mass or hydrocephalus.    DNR Status:  FULL CODE: ATTEMPT RESUSCITATION/CPR    Assessment/Plan:   Active Hospital Problems    Diagnosis    Primary Problem: Acute hypoxic respiratory failure (CMS HCC)    Subdural  hemorrhage (CMS HCC)    NSVT (nonsustained ventricular tachycardia) (CMS HCC)    Heart failure, unspecified HF chronicity, unspecified heart failure type (CMS HCC)    Acute cholecystitis    Thickening of wall of gallbladder with pericholecystic fluid    Symptomatic anemia    COVID-19    Acute blood loss anemia    Transaminitis    Malignant neoplasm of lung, unspecified laterality, unspecified part of lung (CMS HCC)    Pulmonary HTN (CMS HCC)    Pneumonia due to COVID-19 virus    Mood disorder (CMS  HCC)    Anemia, unspecified type    Cigarette smoker    Metastatic cancer to liver    History of breast cancer    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  This is not warrant surgical intervention as it is not causing significant mass effect and she is neurologically stable.  We will plan to repeat a CT head tomorrow morning.  Correct any coagulopathies as able.  Full blood thinning medications.  Maintain systolic blood pressure less than 150 mm Hg.  Progress activity as tolerated with PT.  Okay diet.  Call if any changes in q.1h neurologic assessments.    DVT/PE Prophylaxis:  SCDs.      [place] JINNY Piety, PA-C    This note was partially generated using MModal Fluency Direct system, and there may be some incorrect words, spellings, and punctuation that were not noted in checking the note before saving.           [1] No Known Allergies

## 2024-07-15 NOTE — Care Plan (Signed)
 Safety Rounds changed to hourly

## 2024-07-15 NOTE — Assessment & Plan Note (Signed)
 Likely due to COVID and possible superimposed bacterial pneumonia  Currently on 2 L nasal cannula  Respiratory panel positive for COVID  CT angiogram did not show any evidence pulmonary embolism although marginal increase in the size of infrahilar mass, right hilar and mediastinal lymphadenopathy.  Continue p.o. Decadron    IV Rocephin has been de-escalated to p.o. cefpodoxime  Continue Brovana and Pulmicort   Pulmonary is following, appreciate input

## 2024-07-15 NOTE — Assessment & Plan Note (Signed)
 S/p fall last night  Stat CT head done this morning showed small volume of acute hemorrhage in the subdural space overlying the left frontal lobe anteriorly and superiorly  I discussed with ICU team and transferred patient to ICU for close monitoring.  Stat CBC done showed hemoglobin of 7.9 and platelet count 41 K, INR 1.5.    Ordered 2 units platelets, given the acute subdural hemorrhage, will aim for platelet to be around 100 K, discussed with Hematology  Neurology and neurosurgery consult

## 2024-07-15 NOTE — Assessment & Plan Note (Signed)
 Acute blood loss anemia or acute on chronic anemia, and mild bleeding from 1 of the laparoscopic incisions  Likely secondary to acute bleeding and underlying metastatic disease  S/p vitamin K 10 mg IV x1 dose on 10/03,   S/p 2 units PRBC transfusion on 10/3  She also received 1 units platelets and 2 units FFP on 10/04   CT abdomen done on 10/03 evening showed hematoma associated with the gallbladder fossa with fluid tracking inferiorly, moderate volume hemoperitoneum  CT abdomen repeated on 10/04 due to patient complaining of worsening abdomen pain and distention, showed stable hematoma in gallbladder fossa with slight decrease in volume of hemoperitoneum  S/p suture of bleeding laparoscopic incision by General surgery on 10/05 and bleeding has been controlled   S/p 1 FFP and 1 unit PRBC on 10/05   Anemia panel not suggestive of iron  deficiency  Stool occult blood   Monitor CBC and transfuse for hemoglobin less than 7  General surgery and Hematology are following, appreciate input

## 2024-07-15 NOTE — Assessment & Plan Note (Signed)
S/p mastectomy 

## 2024-07-15 NOTE — Nurses Notes (Signed)
 Pt. [name] at [facility] trying to go to [facility] FNP and nsg supervisor Randine Saba RN notified-No apparent injury-pt. Skin cleaned of stool,pt. Assisted back to bed.

## 2024-07-15 NOTE — Consults (Signed)
 [facility] MEDICINE [facility] [place]  [address]  [place] [address]     Consult  Follow Up Note    [name], [name], 68 y.o. female  Date of Birth:  [birth year]  Encounter Start Date:  07/08/2024  Inpatient Admission Date: 07/08/2024   Date of service: 07/15/24     Requesting MD: No ref. provider found     Reason for consultation: Acute cholecystitis    HPI:  [name] is a 68 y.o. female who was sent to the emergency department from infusion Center where she had presented to start chemotherapy.  She has a recent diagnosis of small cell carcinoma with liver Mets.  She complained of dyspnea and hypoxia and was diagnosed with COVID.  CT abdomen and pelvis with findings of hepatomegaly, hepatic steatosis and hepatic lesions, new pericholecystic fluid.  General surgery consulted for evaluation.    Subjective:  Patient resting in bed, no acute distress.  [name] postop day 4 for robotic cholecystectomy.  Abdomen is soft, mildly distended, expected postop tenderness with hypoactive bowel sounds.  Tolerating regular diet without nausea or vomiting.  Patient had some persistent bleeding from right lower quadrant trocar site, site was packed with Surgicel and closed with 2 interrupted Monocryl sutures per Dr. Fabiene over the weekend.  Patient has been transferred to intensive care unit status post fall early this a.m. and found to have small volume of acute hemorrhage in the subdural space overlying the left frontal lobe on CT scan    Historical Data   Past Medical History:   Diagnosis Date    Abdominal pain     Cancer (CMS HCC)     Chest pain     Chronic lung disease     Coronary artery disease     Dyslipidemia     Essential hypertension     Heart disease     History of percutaneous coronary intervention     History of stress test     Mixed hyperlipidemia     Unspecified chronic bronchitis          Past Surgical History:   Procedure Laterality Date    CARDIAC CATHETERIZATION       CORONARY ANGIOPLASTY      CORONARY ARTERY STENT PLACEMENT      HX BACK SURGERY      HX CORONARY ARTERY BYPASS GRAFT      HX MASTECTOMY, SIMPLE Bilateral     HX TUBAL LIGATION      NECK SURGERY           Allergies[1]    Social History  Social History[2]         Current Outpatient Medications   Medication Instructions    anastrozole  (ARIMIDEX ) 1 mg, Daily    aspirin  81 mg, Oral, Daily    atorvastatin  (LIPITOR) 40 mg, Oral, Daily    cholecalciferol  (vitamin D3) 1,000 Units, Daily    clonazePAM  (KLONOPIN ) 0.5 mg, 2 TIMES DAILY PRN    DULoxetine  (CYMBALTA  DR) 60 mg, Daily    fluticasone  propion-salmeteroL (ADVAIR ) 250-50 mcg/dose Inhalation oral diskus inhaler 1 Inhalation, Inhalation, 2 TIMES DAILY    fluticasone  propionate (FLONASE ) 50 mcg/actuation Nasal Spray, Suspension 1 Spray, DAILY PRN    gabapentin  (NEURONTIN ) 400 mg, Oral, 3 TIMES DAILY    losartan  (COZAAR ) 25 mg, Oral, Daily    metoprolol  tartrate (LOPRESSOR ) 25 mg, 2 TIMES DAILY    oxyCODONE -acetaminophen  (PERCOCET) 7.5-325 mg Oral Tablet 1 Tablet, Oral, EVERY 4  HOURS PRN    prochlorperazine  (COMPAZINE ) 10 mg, Oral, 3 TIMES DAILY PRN    promethazine  (PHENERGAN ) 25 mg, Oral, EVERY 8 HOURS PRN    traZODone  (DESYREL ) 150 mg, NIGHTLY        albuterol  (PROVENTIL ) 2.5 mg / 3 mL (0.083%) neb solution, 2.5 mg, Nebulization, 4x/day  aluminum-magnesium  hydroxide-simethicone (MAG-AL PLUS) 200-200-20 mg per 5 mL oral liquid, 30 mL, Oral, Q4H PRN  amiodarone  (CORDARONE ) tablet, 200 mg, Oral, Daily with Breakfast  anastrozole  (ARIMIDEX ) tablet, 1 mg, Oral, Daily  budesonide (PULMICORT RESPULES) 0.5 mg/2 mL nebulizer suspension, 1 mg, Nebulization, 2x/day   And  arformoterol (BROVANA) 15 mcg/2 mL nebulizer solution, 15 mcg, Nebulization, 2x/day  atorvastatin  (LIPITOR) tablet, 40 mg, Oral, Daily  benzonatate (TESSALON) capsule, 100 mg, Oral, Q8H PRN  cefpodoxime (VANTIN) tablet, 400 mg, Oral, 2x/day-Food  cholecalciferol  (VITAMIN D3) 1000 unit (25 mcg) tablet, 1,000 Units,  Oral, Daily  clonazePAM  (klonoPIN ) tablet, 0.5 mg, Oral, 2x/day PRN  NS 250 mL flush bag, , Intravenous, Q15 Min PRN   And  D5W 250 mL flush bag, , Intravenous, Q15 Min PRN  dexAMETHasone  (DECADRON ) tablet 6 mg, 6 mg, Oral, Daily  diphenhydrAMINE (BENADRYL) capsule, 25 mg, Oral, HS PRN  DULoxetine  (CYMBALTA ) delayed release capsule, 60 mg, Oral, Daily  [Held by provider] enoxaparin PF (LOVENOX) 40 mg/0.4 mL SubQ injection, 40 mg, Subcutaneous, Q24H  fluticasone  (FLONASE ) 50 mcg per spray nasal spray, 1 Spray, Each Nostril, Daily PRN  folic acid  (FOLVITE ) tablet, 1 mg, Oral, Daily  gabapentin  (NEURONTIN ) capsule, 400 mg, Oral, 3x/day  HYDROcodone -acetaminophen  (NORCO) 5-325 mg per tablet, 1 Tablet, Oral, Q6H PRN  HYDROmorphone  (DILAUDID ) 0.5 mg/0.5 mL injection, 0.5 mg, Intravenous, Q4H PRN  losartan  (COZAAR ) tablet, 25 mg, Oral, Daily  magnesium  hydroxide (MILK OF MAGNESIA) 400mg  per 5mL oral liquid, 15 mL, Oral, 4x/day PRN  metoprolol  tartrate (LOPRESSOR ) tablet, 25 mg, Oral, 2x/day  multivitamin-minerals-iron  oral liquid, 15 mL, Oral, Daily  NS bolus infusion 40 mL, 40 mL, Intravenous, Once PRN  NS flush syringe, 3 mL, Intracatheter, Q8HRS  NS flush syringe, 3 mL, Intracatheter, Q1H PRN  OLANZapine  (zyPREXA ) tablet, 5 mg, Oral, NIGHTLY  omega-3 fatty acids (LOVAZA ) capsule, 1 g, Oral, Daily  ondansetron  (ZOFRAN ) 2 mg/mL injection, 4 mg, Intravenous, Q6H PRN  pantoprazole  (PROTONIX ) delayed release tablet, 40 mg, Oral, Daily Before Lunch  polyethylene glycol (MIRALAX) oral packet, 17 g, Oral, Daily  prochlorperazine  (COMPAZINE ) tablet, 10 mg, Oral, 3x/day PRN  traZODone  (DESYREL ) tablet, 150 mg, Oral, NIGHTLY        Review of Systems:   All pertinent review of systems as addressed and detailed in HPI above    Vital Signs:  Patient Vitals for the past 24 hrs:   BP Temp Pulse Resp SpO2   07/15/24 1115 (!) 100/58 -- 86 (!) 23 93 %   07/15/24 1100 103/62 -- 87 (!) 22 93 %   07/15/24 1045 100/63 -- 87 20 92 %   07/15/24  1030 124/62 -- 87 (!) 22 93 %   07/15/24 1015 109/60 36.8 C (98.3 F) 87 (!) 24 94 %   07/15/24 1000 (!) 105/92 -- 86 (!) 22 92 %   07/15/24 0945 108/73 -- -- -- --   07/15/24 0919 91/62 37.2 C (99 F) 83 17 94 %   07/15/24 0900 (!) 91/59 -- 84 -- --   07/15/24 0750 92/62 36.6 C (97.9 F) 84 18 (!) 33 %   07/15/24 0500 115/79 -- 88 20 98 %  07/14/24 2304 105/87 36.5 C (97.7 F) 88 16 95 %   07/14/24 2014 (!) 140/78 36.1 C (97 F) 84 17 96 %   07/14/24 1833 -- -- -- 16 --   07/14/24 1820 (!) 141/74 36.9 C (98.4 F) 85 18 96 %   07/14/24 1815 (!) 146/88 36.7 C (98.1 F) 86 18 96 %   07/14/24 1810 130/77 36.5 C (97.7 F) 86 17 95 %   07/14/24 1801 (!) 142/80 36.6 C (97.9 F) 89 16 95 %   07/14/24 1537 119/67 37.2 C (98.9 F) 81 18 96 %   07/14/24 1357 121/75 36.7 C (98.1 F) 78 17 98 %   07/14/24 1242 107/62 36.6 C (97.9 F) 77 16 96 %   07/14/24 1237 116/64 36.7 C (98.1 F) 75 17 97 %   07/14/24 1232 102/61 36.7 C (98.1 F) 76 17 97 %   07/14/24 1221 112/64 36.9 C (98.4 F) 76 16 97 %        Physical Exam:  Physical Exam  Constitutional:       General: She is not in acute distress.  Cardiovascular:      Rate and Rhythm: Normal rate.   Pulmonary:      Comments: Reports some baseline shortness of breath  Abdominal:      Comments: Abdomen is soft, mildly distended, expected postop tenderness with hypoactive bowel sounds.    Skin:     General: Skin is warm and dry.      Comments: No further bleeding noted from right lower quadrant trocar site, bandage clean dry and intact   Neurological:      Mental Status: She is alert and oriented to person, place, and time.          Studies:  I have reviewed all available studies within the electronic medical record.    Results for orders placed or performed during the hospital encounter of 07/08/24 (from the past 24 hours)   CBC   Result Value Ref Range    WBC 7.5 3.7 - 11.0 x10^3/uL    RBC 2.60 (L) 3.85 - 5.22 x10^6/uL    HGB 7.0 (L) 11.5 - 16.0 g/dL    HCT 77.3 (L)  65.1 - 46.0 %    MCV 86.9 78.0 - 100.0 fL    MCH 26.9 26.0 - 32.0 pg    MCHC 31.0 31.0 - 35.5 g/dL    RDW-CV 83.9 (H) 88.4 - 15.5 %    PLATELETS 40 (L) 150 - 400 x10^3/uL    MPV 10.5 8.7 - 12.5 fL   PT/INR   Result Value Ref Range    PROTHROMBIN TIME 17.7 (H) 12.1 - 15.3 seconds    INR 1.42 (H) 0.86 - 1.14   COMPREHENSIVE METABOLIC PANEL, NON-FASTING   Result Value Ref Range    SODIUM 145 (H) 133 - 144 mmol/L    POTASSIUM 3.1 (L) 3.2 - 5.0 mmol/L    CHLORIDE 104 96 - 106 mmol/L    CO2 TOTAL 22 22 - 30 mmol/L    ANION GAP 19 (H) 7 - 18 mmol/L    BUN 44 (H) 8 - 23 mg/dL    CREATININE 9.00 (H) 0.50 - 0.90 mg/dL    ESTIMATED GFR 62 (L) >90 mL/min/1.49m^2    ALBUMIN 2.8 (L) 3.5 - 5.2 g/dL    CALCIUM 8.8 8.3 - 89.2 mg/dL    GLUCOSE 840 (H) 74 - 109 mg/dL    ALKALINE PHOSPHATASE 412 (H) 35 -  129 U/L    ALT (SGPT) 499 (H) 0 - 33 U/L    AST (SGOT) 834 (H) 0 - 32 U/L    BILIRUBIN TOTAL 2.6 (H) 0.2 - 1.2 mg/dL    PROTEIN TOTAL 5.3 (L) 6.4 - 8.3 g/dL   THYROID STIMULATING HORMONE WITH FREE T4 REFLEX   Result Value Ref Range    TSH 1.440 0.270 - 4.200 uIU/mL   PTT (PARTIAL THROMBOPLASTIN TIME)   Result Value Ref Range    APTT 28.6 22.4 - 34.8 seconds   FIBRINOGEN   Result Value Ref Range    FIBRINOGEN 459 251 - 576 mg/dL   H & H   Result Value Ref Range    HGB 8.9 (L) 11.5 - 16.0 g/dL    HCT 72.2 (L) 65.1 - 46.0 %   PRODUCT: PLATELETS - UNITS , 2 Units   Result Value Ref Range    Coding System ISBT128     UNIT NUMBER [id number]     BLOOD COMPONENT TYPE PATHOGEN REDUCED PLASMA REDUCED1     UNIT DIVISION 00     UNIT DISPENSE STATUS ALLOCATED     TRANSFUSION STATUS OK TO TRANSFUSE     Product Code E8341V00     Coding System ISBT128     UNIT NUMBER [id number]     BLOOD COMPONENT TYPE PATHOGEN REDUCED PLASMA REDUCED3     UNIT DIVISION 00     UNIT DISPENSE STATUS ALLOCATED     TRANSFUSION STATUS OK TO TRANSFUSE     Product Code [id number]    BASIC METABOLIC PANEL   Result Value Ref Range    SODIUM 147 (H) 133 - 144 mmol/L     POTASSIUM 3.5 3.2 - 5.0 mmol/L    CHLORIDE 106 96 - 106 mmol/L    CO2 TOTAL 22 22 - 30 mmol/L    ANION GAP 19 (H) 7 - 18 mmol/L    CALCIUM 8.9 8.3 - 10.7 mg/dL    GLUCOSE 878 (H) 74 - 109 mg/dL    BUN 48 (H) 8 - 23 mg/dL    CREATININE 8.83 (H) 0.50 - 0.90 mg/dL    BUN/CREA RATIO 41     ESTIMATED GFR 52 (L) >90 mL/min/1.29m^2   MAGNESIUM    Result Value Ref Range    MAGNESIUM  2.0 1.6 - 2.4 mg/dL   CBC   Result Value Ref Range    WBC 8.5 3.7 - 11.0 x10^3/uL    RBC 2.82 (L) 3.85 - 5.22 x10^6/uL    HGB 7.9 (L) 11.5 - 16.0 g/dL    HCT 74.9 (L) 65.1 - 46.0 %    MCV 88.7 78.0 - 100.0 fL    MCH 28.0 26.0 - 32.0 pg    MCHC 31.6 31.0 - 35.5 g/dL    RDW-CV 83.5 (H) 88.4 - 15.5 %    PLATELETS 41 (L) 150 - 400 x10^3/uL    MPV 9.9 8.7 - 12.5 fL   PT/INR   Result Value Ref Range    PROTHROMBIN TIME 19.2 (H) 12.1 - 15.3 seconds    INR 1.59 (H) 0.86 - 1.14     No results found.       Assessment & Plan:  Active Hospital Problems    Diagnosis    Primary Problem: Acute hypoxic respiratory failure (CMS HCC)    Subdural hemorrhage (CMS HCC)    NSVT (nonsustained ventricular tachycardia) (CMS HCC)    Heart failure, unspecified HF chronicity, unspecified heart failure type (CMS HCC)  Acute cholecystitis    Thickening of wall of gallbladder with pericholecystic fluid    Symptomatic anemia    COVID-19    Acute blood loss anemia    Transaminitis    Malignant neoplasm of lung, unspecified laterality, unspecified part of lung (CMS HCC)    Pulmonary HTN (CMS HCC)    Pneumonia due to COVID-19 virus    Mood disorder (CMS HCC)    Anemia, unspecified type    Cigarette smoker    Metastatic cancer to liver    History of breast cancer    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  Continue diet as tolerated.  No further bleeding from trocar site, dressing changes as needed. Continue  ICU management per critical care team for subdural hematoma status post fall this a.m.SABRA    Verneita LITTIE Pizza, APRN, CNP    Attending attestation:  I saw and evaluated the patient with my midlevel in the ICU. I performed a substantial portion of the patients care.  I personally performed the exam and the medical decision making for the encounter today.  DOS: 07/15/24    Patient had bleeding from the trocar site over the weekend.  Stitches have since been placed and there was no bleeding since.  Patient did require blood transfusions over the weekend.    However during the course of the night patient allegedly fell in her room.  She was found to have a subdural hematoma noted on CT scan this morning and subsequently transferred to the ICU.     We will continue to follow.      This chart was partially dictated by [employer], a voice recognition software.  It may contain errors or words that were not intended to be used.  Please contact the provider for errors or clarifications.    /Medhansh Brinkmeier JINNY Raider MD  07/15/24 12:11   Department of Surgery                   [1] No Known Allergies  [2]   Social History  Tobacco Use    Smoking status: Every Day     Current packs/day: 0.50     Average packs/day: 0.5 packs/day for 52.8 years (26.4 ttl pk-yrs)     Types: Cigarettes     Start date: 1973    Smokeless tobacco: Never   Vaping Use    Vaping status: Never Used   Substance Use Topics    Alcohol use: Yes     Comment: occasional    Drug use: Yes     Types: Marijuana

## 2024-07-15 NOTE — Progress Notes (Signed)
 [facility] Coverage Note          Name:  [name] MRN:  [id number]   Admission Date: 07/08/2024   DOB:  Oct 23, 1955 (68 y.o.)   Date of service: 07/15/2024       Earlier in the night, patient had flipped atrial fibrillation with RVR with a heart rate in the 150s.  Blood pressure was soft.  A dose of metoprolol  2.5 mg IV x1 was given.  A short time later, patient converted back to sinus rhythm with a heart rate in the 80s.    Overnight Coverage Note:  Otherwise no change in plan of care.  Continue current medical management and supportive care.  Will defer further management decisions to primary team in the morning.    Warren Edsel Riggs, APRN, CNP  07/15/2024, 00:39

## 2024-07-15 NOTE — Progress Notes (Addendum)
 [facility]  Progress Note    [name]  Date of service: 07/15/2024   Date of Admission:  07/08/2024    Hospital Day:  LOS: 7 days   Subjective: confused    Interval History:    [name] is a 68 y.o. female w/recently diagnosed Small-cell carcinoma w/liver mets, COPD, current smoker - not on O2 at home, CABG. She was found to have lung mass on last admission and underwent bronchoscopy w/bx of station 7 which revealed the small cell carcinoma. She came to hospital from outpatient infusion center to start chemotherapy but had dyspnea and hypoxia and was sent to ER for further evaluation. She was found to have COVID. She reports she's been feeling bad for the last few weeks. CTA chest - no PE, marginal increase in size of right infrahilar mass, right hilar and mediastinal lymphadenopathy compared to prior study. CT A/P: hepatomegaly, hepatic steatosis, and hepatic lesions not much change, new pericholecystic fluid now present. We are consulted for acute hypoxic respiratory failure, known patient.      [name] currently on 2 L NC/93%.  She is awake and alert.  Feels breathing is doing okay at present time.  Has some mild cough.  No chest pain.  No fevers noted.  No N/V/D.  No associated symptoms or modifying factors.      10/2: Patient on 3LNC/96%. Awake and alert. Feels breathing is improving. She is s/p lap chole. No chest pain. No fevers noted. No n/v/d.  No associated symptoms or modifying factors.      10/3: Patient on room air/97%. Denies shortness of breath. Not much cough. No chest pain. Having bowel movements. No obvious signs of bleeding. Hgb 6.2 this morning, getting blood transfusion.  No associated symptoms or modifying factors.      10/4:  Today on 3L NC.  Received 2 units PRBCs yesterday.  Hemoglobin up to 8.1 today.  Receiving breathing treatment on exam.  Patient states she is not feeling much better today.    10/5:  On 2 L NC.  Hemoglobin dropped again this morning so  had to get 1 unit PRBC.     10/6: Patient on 2LNC/92%. Fell last night. Moved to ICU for confusion and found to have small volume of acute hemorrhage in subdural space overlying the left frontal lobe anteriorly and superiorly. She is awake and alert. A/Ox 2, oriented to person, place. Daughter at bedside. Denies blurry vision. MAE x4 w/no weakness.  No associated symptoms or modifying factors.     Vital Signs:  Temp  Avg: 36.7 C (98.1 F)  Min: 36.1 C (97 F)  Max: 37.2 C (99 F)    Pulse  Avg: 85.1  Min: 81  Max: 89 BP  Min: 91/62  Max: 146/88   Resp  Avg: 20.4  Min: 16  Max: 24 SpO2  Avg: 93.7 %  Min: 90 %  Max: 99 %          Input/Output    Intake/Output Summary (Last 24 hours) at 07/15/2024 1657  Last data filed at 07/15/2024 1330  Gross per 24 hour   Intake 1100 ml   Output --   Net 1100 ml    I/O last shift:  10/06 0700 - 10/06 1859  In: 330 [Blood:330]  Out: -    albuterol  (PROVENTIL ) 2.5 mg / 3 mL (0.083%) neb solution, 2.5 mg, Nebulization, 4x/day  aluminum-magnesium  hydroxide-simethicone (MAG-AL PLUS) 200-200-20 mg per 5 mL  oral liquid, 30 mL, Oral, Q4H PRN  amiodarone  (CORDARONE ) tablet, 200 mg, Oral, Daily with Breakfast  anastrozole  (ARIMIDEX ) tablet, 1 mg, Oral, Daily  budesonide (PULMICORT RESPULES) 0.5 mg/2 mL nebulizer suspension, 1 mg, Nebulization, 2x/day   And  arformoterol (BROVANA) 15 mcg/2 mL nebulizer solution, 15 mcg, Nebulization, 2x/day  atorvastatin  (LIPITOR) tablet, 40 mg, Oral, Daily  benzonatate (TESSALON) capsule, 100 mg, Oral, Q8H PRN  cefpodoxime (VANTIN) tablet, 400 mg, Oral, 2x/day-Food  cholecalciferol  (VITAMIN D3) 1000 unit (25 mcg) tablet, 1,000 Units, Oral, Daily  clonazePAM  (klonoPIN ) tablet, 0.5 mg, Oral, 2x/day PRN  NS 250 mL flush bag, , Intravenous, Q15 Min PRN   And  D5W 250 mL flush bag, , Intravenous, Q15 Min PRN  dexAMETHasone  (DECADRON ) tablet 6 mg, 6 mg, Oral, Daily  diphenhydrAMINE (BENADRYL) capsule, 25 mg, Oral, HS PRN  DULoxetine  (CYMBALTA ) delayed release  capsule, 60 mg, Oral, Daily  [Held by provider] enoxaparin PF (LOVENOX) 40 mg/0.4 mL SubQ injection, 40 mg, Subcutaneous, Q24H  fluticasone  (FLONASE ) 50 mcg per spray nasal spray, 1 Spray, Each Nostril, Daily PRN  folic acid  (FOLVITE ) tablet, 1 mg, Oral, Daily  gabapentin  (NEURONTIN ) capsule, 400 mg, Oral, 3x/day  HYDROcodone -acetaminophen  (NORCO) 5-325 mg per tablet, 1 Tablet, Oral, Q6H PRN  HYDROmorphone  (DILAUDID ) 0.5 mg/0.5 mL injection, 0.5 mg, Intravenous, Q4H PRN  losartan  (COZAAR ) tablet, 25 mg, Oral, Daily  magnesium  hydroxide (MILK OF MAGNESIA) 400mg  per 5mL oral liquid, 15 mL, Oral, 4x/day PRN  metoprolol  tartrate (LOPRESSOR ) tablet, 25 mg, Oral, 2x/day  multivitamin-minerals-iron  oral liquid, 15 mL, Oral, Daily  NS flush syringe, 3 mL, Intracatheter, Q8HRS  NS flush syringe, 3 mL, Intracatheter, Q1H PRN  OLANZapine  (zyPREXA ) tablet, 5 mg, Oral, NIGHTLY  omega-3 fatty acids (LOVAZA ) capsule, 1 g, Oral, Daily  ondansetron  (ZOFRAN ) 2 mg/mL injection, 4 mg, Intravenous, Q6H PRN  pantoprazole  (PROTONIX ) delayed release tablet, 40 mg, Oral, Daily Before Lunch  polyethylene glycol (MIRALAX) oral packet, 17 g, Oral, Daily  prochlorperazine  (COMPAZINE ) tablet, 10 mg, Oral, 3x/day PRN  traZODone  (DESYREL ) tablet, 150 mg, Oral, NIGHTLY        Physical Exam:    General: Alert and oriented x2, chronically ill appearing, no acute distress  Eye: PERRL, EOMI, normal conjunctiva  HENT: Normocephalic, normal hearing, moist oral mucosa, sinus tenderness  Neck: Supple, non-tender, no JVD, no lymphadenopathy  Lungs: Clear to auscultation and percussion, non-labored respiration  Heart: Normal rate, regular rhythm, NSR on tele, no murmur, gallop, or edema  Abdomen: soft, non-tender, non-distended, normal bowel sounds, no masses  Musculoskeletal:  Normal range of motion and strength, no tenderness or swelling  Peripheral pulses:  2+ radial (L) and 2+ radial (R)  Skin: warm, dry, pink, and intact, no rashes or  lesions  Neurological:  Awake, alert, and oriented x2, MAE, CNII-XII intact  Psychiatric:  Cooperative, appropriate mood and affect    Respiratory: 2LNC    Labs:     Results for orders placed or performed during the hospital encounter of 07/08/24 (from the past 24 hours)   H & H   Result Value Ref Range    HGB 8.9 (L) 11.5 - 16.0 g/dL    HCT 72.2 (L) 65.1 - 46.0 %   PRODUCT: PLATELETS - UNITS , 2 Units   Result Value Ref Range    Coding System [id number]     UNIT NUMBER [id number]     BLOOD COMPONENT TYPE PATHOGEN REDUCED PLASMA REDUCED1     UNIT DIVISION 00     UNIT  DISPENSE STATUS ISSUED     TRANSFUSION STATUS OK TO TRANSFUSE     Product Code E8341V00     Coding System [id number]     UNIT NUMBER [id number]     BLOOD COMPONENT TYPE PATHOGEN REDUCED PLASMA REDUCED3     UNIT DIVISION 00     UNIT DISPENSE STATUS ISSUED     TRANSFUSION STATUS OK TO TRANSFUSE     Product Code [id number]    BASIC METABOLIC PANEL   Result Value Ref Range    SODIUM 147 (H) 133 - 144 mmol/L    POTASSIUM 3.5 3.2 - 5.0 mmol/L    CHLORIDE 106 96 - 106 mmol/L    CO2 TOTAL 22 22 - 30 mmol/L    ANION GAP 19 (H) 7 - 18 mmol/L    CALCIUM 8.9 8.3 - 10.7 mg/dL    GLUCOSE 878 (H) 74 - 109 mg/dL    BUN 48 (H) 8 - 23 mg/dL    CREATININE 8.83 (H) 0.50 - 0.90 mg/dL    BUN/CREA RATIO 41     ESTIMATED GFR 52 (L) >90 mL/min/1.31m^2   MAGNESIUM    Result Value Ref Range    MAGNESIUM  2.0 1.6 - 2.4 mg/dL   CBC   Result Value Ref Range    WBC 8.5 3.7 - 11.0 x10^3/uL    RBC 2.82 (L) 3.85 - 5.22 x10^6/uL    HGB 7.9 (L) 11.5 - 16.0 g/dL    HCT 74.9 (L) 65.1 - 46.0 %    MCV 88.7 78.0 - 100.0 fL    MCH 28.0 26.0 - 32.0 pg    MCHC 31.6 31.0 - 35.5 g/dL    RDW-CV 83.5 (H) 88.4 - 15.5 %    PLATELETS 41 (L) 150 - 400 x10^3/uL    MPV 9.9 8.7 - 12.5 fL   PT/INR   Result Value Ref Range    PROTHROMBIN TIME 19.2 (H) 12.1 - 15.3 seconds    INR 1.59 (H) 0.86 - 1.14      Radiology:      Results for orders placed or performed during the hospital encounter of 07/08/24 (from the past  24 hours)   CT BRAIN WO IV CONTRAST     Status: Abnormal    Narrative    Deborha JEAN Sciulli    CT BRAIN WO IV CONTRAST performed on 07/15/2024 8:16 AM.    INDICATION:  Fall   Additional History:  fall, nausea, weakness    TECHNIQUE:  Unenhanced CT head with axial, coronal, and sagittal multiplanar reformations. Dose modulation, automated exposure control, and/or iterative reconstruction were used for dose reduction.    COMPARISON: June 14, 2024    FINDINGS:  There is a small amount of acute blood products in the subdural space overlying the left frontal lobe anteriorly and inferiorly. This measures 6 mm in thickness. A small collection of blood in the subdural space along the high convexity of the left frontal lobe is also seen. There is very mild mass effect on the left frontal lobe with no significant effacement of the ventricular system, edema or midline shift.    Elsewhere, the brain, ventricles and subarachnoid spaces are within normal limits. There is no evidence of acute large vessel infarct. No acute blood products are seen elsewhere. The calvarium appears intact.              Impression    Small volume of acute hemorrhage in the subdural space overlying the left frontal lobe anteriorly and superiorly  as detailed above. No significant change otherwise seen.  .  .  .  s/d/g    A Critical Red actionable finding has been sent via the [employer] Actionable Findings application on 07/15/2024 8:26 AM, Message ID [id number]. Receipt of this communication will be communicated to [facility] RADIOLOGY STAFF or responsible provider and will be documented in [employer] Actionable Findings System upon receiving the acknowledgement.    A Critical Red actionable finding has been sent via the [employer] Actionable Findings application on 07/15/2024 8:26 AM, Message ID [id number]. Receipt of this communication will be communicated to [facility] RADIOLOGY STAFF or responsible provider and will be documented in [employer]  Actionable Findings System upon receiving the acknowledgement.      Radiologist location ID: [id number]          Assessment & Plan:  Active Hospital Problems    Diagnosis    Primary Problem: Acute hypoxic respiratory failure (CMS HCC)    Subdural hematoma (CMS HCC)    NSVT (nonsustained ventricular tachycardia) (CMS HCC)    Heart failure, unspecified HF chronicity, unspecified heart failure type (CMS HCC)    Acute cholecystitis    Thickening of wall of gallbladder with pericholecystic fluid    Symptomatic anemia    COVID-19    Acute blood loss anemia    Transaminitis    Malignant neoplasm of lung, unspecified laterality, unspecified part of lung (CMS HCC)    Pulmonary HTN (CMS HCC)    Pneumonia due to COVID-19 virus    Mood disorder (CMS HCC)    Anemia, unspecified type    Cigarette smoker    Metastatic cancer to liver    History of breast cancer    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)    Paroxysmal atrial fibrillation (CMS HCC)    Hypertension    CAD (coronary artery disease)      Plan:  Patient seen and examined. Reviewed pertinent labs, imaging, and notes.  68 y/o female recently dx SCLC w/liver mets, COPD not on O2 at home, current smoker, was out outpatient infusion center to start chemo and came in for dyspnea and hypoxia, found to have COVID  CTA chest - no PE, marginal increase in size of right infrahilar mass, right hilar and mediastinal lymphadenopathy compared to prior study. CT A/P: hepatomegaly, hepatic steatosis, and hepatic lesions not much change, new pericholecystic fluid now present.   Sent to ICU after fall this morning found to have bleed on imaging  CTH - small volume of acute hemorrhage in subdural space overlying the left frontal lobe anteriorly and superiorly; Neurosurgery/Neurology consulted  Lovenox on hold, platelets low, getting platelets transfused, INR this morning 1.59  Wean oxygen as patient tolerates for SpO2 >/=92%, on 3LNC  Continue steroids  Agree with  empiric antibiotics-ceftriaxone  Urine antigens negative  U/S showed acute cholecystitis with pericholecystic fluid and wall thickening  She is now s/p lap chole on 10/2  Had repeat CT of abdomen and pelvis which did not show increasing hemoperitoneum  Required blood transfusion over the weekend, Hgb 7.9  Had bleeding around surgical sites over the weekend, now stopped today after surgicel/sutures  Fecal occult ordered  Continue Brovana/Pulmicort   Sputum culture grew light growth yeast   Continue neuro check q1 hr  Continue nebs  Continue to closely monitor in ICU  This patient was seen and examined with my attending and he will add any further recommendations.     Yezenia G Soto, APRN,  CNP    This note may have been partially generated using MModal Fluency Direct system, and there may be some incorrect words, spellings, and punctuation that were not noted in checking the note before saving.        Attending physician:  I have seen and examined the patient.  Medications, labs, images reviewed.    Pulmonary toilet, incentive spirometer, out of bed as tolerated.  Supplemental oxygen with nebs as needed.  CT head images reviewed and compared to prior available studies.  Small left frontal lobe subdural hematoma.  Holding all anticoagulation.    Continue hourly neuro checks.    Monitor CBC and transfuse PRBC as needed.    Continue empiric antibiotics.  Continue steroids IV-->PO when more stable.    Limit sedating and deliriogenic medications.  Diet as tolerated.  Neurosurgery consulted.    Neurology consulted.  Oncology consulted.  General surgery is following - s/p robotic cholecystectomy.    [name] medically complex with increased risk for sudden decompensation and requires close monitoring of vitals, labs, and/or imaging.      [name] critically ill with acute organ system failure and has high risk of sudden decompensation resulting in morbidity/mortality without ICU level of care. Prognosis is guarded.  CCT  performed by me personally: 35 min. CCT time does not include billable procedures. CCT time does include review of pertinent information database, available radiographic images, discussion with other consulting physicians, and formulating a comprehensive care plan that was discussed with care team.    Manus Free, DO   07/15/2024  16:46  Pulmonary and Critical Care Medicine

## 2024-07-15 NOTE — Consults (Signed)
 [facility]  Hematology/Oncology    Progress Note      Rachel, [name], 68 y.o. 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  She is s/p fall last night which warranted CT scan of head, revealing subdural hemorrhage overlying left frontal lobe anteriorly and superiorly.  Noted mentation status change as well.  Neurosurgery and Neurology have been consulted.    Review of Systems:   Review of Systems   Constitutional:  Negative for activity change, appetite change, chills, diaphoresis, fatigue, fever and unexpected weight change.   HENT:  Negative for congestion, ear pain, facial swelling, hearing loss, mouth sores, nosebleeds, sinus pain, sore throat, tinnitus, trouble swallowing and voice change.    Eyes:  Negative for photophobia, pain and visual disturbance.   Respiratory:  Negative for apnea, cough, choking, chest tightness, shortness of breath, wheezing and stridor.    Cardiovascular:  Negative for chest pain, palpitations and leg swelling.   Gastrointestinal:  Negative for abdominal distention, abdominal pain, anal bleeding, blood in stool, constipation, diarrhea, nausea, rectal pain and vomiting.   Endocrine: Negative for cold intolerance and heat intolerance.   Genitourinary:  Negative for decreased urine volume, difficulty urinating, dysuria, flank pain, frequency, hematuria and urgency.   Musculoskeletal:  Negative for arthralgias, back pain, gait problem, joint swelling, myalgias and neck pain.   Skin:  Negative for color change, pallor, rash and wound.   Allergic/Immunologic: Negative for immunocompromised state.   Neurological:  Positive for weakness. Negative for dizziness, syncope, facial asymmetry, speech difficulty, light-headedness, numbness and headaches.   Hematological:  Negative for  adenopathy. Does not bruise/bleed easily.   Psychiatric/Behavioral:  Positive for confusion. Negative for agitation and behavioral problems.      Past Medical History:   Diagnosis Date    Abdominal pain     Cancer (CMS HCC)     Chest pain     Chronic lung disease     Coronary artery disease     Dyslipidemia     Essential hypertension     Heart disease     History of percutaneous coronary intervention     History of stress test     Mixed hyperlipidemia     Unspecified chronic bronchitis      Past Surgical History:   Procedure Laterality Date    CARDIAC CATHETERIZATION      CORONARY ANGIOPLASTY      CORONARY ARTERY STENT PLACEMENT      HX BACK SURGERY      HX CORONARY ARTERY BYPASS GRAFT      HX MASTECTOMY, SIMPLE Bilateral     HX TUBAL LIGATION      NECK SURGERY       Social History     Socioeconomic History    Marital status: Widowed     Spouse name: Not on file    Number of children: Not on file    Years of education: Not on file    Highest education level: Not on file   Occupational History    Not on file   Tobacco Use    Smoking status: Every Day     Current packs/day: 0.50     Average packs/day: 0.5 packs/day for 52.8 years (26.4 ttl pk-yrs)  Types: Cigarettes     Start date: 1973    Smokeless tobacco: Never   Vaping Use    Vaping status: Never Used   Substance and Sexual Activity    Alcohol use: Yes     Comment: occasional    Drug use: Yes     Types: Marijuana    Sexual activity: Not on file   Other Topics Concern    Not on file   Social History Narrative    Not on file     Social Determinants of Health     Financial Resource Strain: Not on file   Transportation Needs: Not on file   Social Connections: Low Risk (07/09/2024)    Social Connections     SDOH Social Isolation: 5 or more times a week   Intimate Partner Violence: Not on file   Housing Stability: Not on file        Family Medical History:       Problem Relation (Age of Onset)    Arthritis Mother, Maternal Grandmother    Cancer Father, Other    Heart  Attack Father, Other    Heart Disease Brother, Other    Hypertension (High Blood Pressure) Mother, Father, Other    Pacemaker Mother               Vital Signs:  Temp  Avg: 36.7 C (98.1 F)  Min: 36.1 C (97 F)  Max: 37.2 C (99 F)    Pulse  Avg: 85.1  Min: 81  Max: 89 BP  Min: 91/62  Max: 146/88   Resp  Avg: 20.4  Min: 16  Max: 24 SpO2  Avg: 91.5 %  Min: 33 %  Max: 98 %          Input/Output    Intake/Output Summary (Last 24 hours) at 07/15/2024 1609  Last data filed at 07/15/2024 1330  Gross per 24 hour   Intake 1100 ml   Output --   Net 1100 ml    I/O last shift:  10/06 0700 - 10/06 1859  In: 330 [Blood:330]  Out: -          LABS:   Results for orders placed or performed during the hospital encounter of 07/08/24 (from the past 24 hours)   H & H   Result Value Ref Range    HGB 8.9 (L) 11.5 - 16.0 g/dL    HCT 72.2 (L) 65.1 - 46.0 %   PRODUCT: PLATELETS - UNITS , 2 Units   Result Value Ref Range    Coding System [id number]     UNIT NUMBER [id number]     BLOOD COMPONENT TYPE PATHOGEN REDUCED PLASMA REDUCED1     UNIT DIVISION 00     UNIT DISPENSE STATUS ISSUED     TRANSFUSION STATUS OK TO TRANSFUSE     Product Code E8341V00     Coding System ISBT128     UNIT NUMBER [id number]     BLOOD COMPONENT TYPE PATHOGEN REDUCED PLASMA REDUCED3     UNIT DIVISION 00     UNIT DISPENSE STATUS ISSUED     TRANSFUSION STATUS OK TO TRANSFUSE     Product Code [id number]    BASIC METABOLIC PANEL   Result Value Ref Range    SODIUM 147 (H) 133 - 144 mmol/L    POTASSIUM 3.5 3.2 - 5.0 mmol/L    CHLORIDE 106 96 - 106 mmol/L    CO2 TOTAL 22 22 - 30 mmol/L  ANION GAP 19 (H) 7 - 18 mmol/L    CALCIUM 8.9 8.3 - 10.7 mg/dL    GLUCOSE 878 (H) 74 - 109 mg/dL    BUN 48 (H) 8 - 23 mg/dL    CREATININE 8.83 (H) 0.50 - 0.90 mg/dL    BUN/CREA RATIO 41     ESTIMATED GFR 52 (L) >90 mL/min/1.72m^2   MAGNESIUM    Result Value Ref Range    MAGNESIUM  2.0 1.6 - 2.4 mg/dL   CBC   Result Value Ref Range    WBC 8.5 3.7 - 11.0 x10^3/uL    RBC 2.82 (L) 3.85 - 5.22  x10^6/uL    HGB 7.9 (L) 11.5 - 16.0 g/dL    HCT 74.9 (L) 65.1 - 46.0 %    MCV 88.7 78.0 - 100.0 fL    MCH 28.0 26.0 - 32.0 pg    MCHC 31.6 31.0 - 35.5 g/dL    RDW-CV 83.5 (H) 88.4 - 15.5 %    PLATELETS 41 (L) 150 - 400 x10^3/uL    MPV 9.9 8.7 - 12.5 fL   PT/INR   Result Value Ref Range    PROTHROMBIN TIME 19.2 (H) 12.1 - 15.3 seconds    INR 1.59 (H) 0.86 - 1.14       IMAGES :  Results for orders placed or performed during the hospital encounter of 07/08/24 (from the past 72 hours)   CT ABDOMEN PELVIS WO IV CONTRAST     Status: None    Narrative    Aireana JEAN Dimartino    CT ABDOMEN PELVIS WO IV CONTRAST performed on 07/12/2024 5:00 PM.    INDICATION:  bleeding from laparoscopy incision, significant drop in hemoglobin and hypotensive, to rule out intraperitoneal/retroperitoneal bleeding   Additional History:  bleeding at incision site and abnormal labs; hx of tubal    TECHNIQUE:  Unenhanced CT abdomen and pelvis with axial, coronal, and sagittal multiplanar reformations. Dose modulation, automated exposure control, and/or iterative reconstruction were used for dose reduction.    COMPARISON:  Contrast-enhanced CT dated 07/08/2024  ___________________________________  FINDINGS:    LOWER THORAX: Sternal wires. No lung base infiltrates. Normal heart size. Calcified coronary arteries. Small sliding hiatal hernia.    Chronic and degenerative bony changes. Scoliosis.    Evaluation of abdominal viscera is suboptimal due to noncontrast technique.    HEPATOBILIARY:      Hepatomegaly. Patient reportedly has metastatic liver disease which is not well seen on noncontrast CT.     Reportedly interval cholecystectomy. No surgical clips identified however. Clinical significance uncertain.    There is mixed hyperdense and hypodense material/fluid in the gallbladder fossa. This is thought to represent hematoma within the gallbladder fossa with fluid tracking inferiorly. It is primarily situated anterior to the hepatic flexure of the colon.  Best demonstrated on axial series 3 image 41-49. Also sagittal series 6 image 72. Measures craniocaudal up to 10 cm on sagittal series 6 image 72.. Transaxial up to about 3.5 x 3 cm. There is some fluid superior to the left hepatic lobe between the liver and the anterior abdominal wall and central diaphragm. This is presumably related to blood. There is moderate volume of free fluid in the pelvis. This is dependently hyperdense and compatible with hemoperitoneum. There is a small amount of fluid in the bilateral paracolic gutters. There is a small amount of perisplenic fluid. There is a small hiatal hernia with some fluid tracking into the posterior mediastinum at this level.    PANCREAS:  Pancreas grossly unremarkable.  SPLEEN: No splenomegaly is seen.  ADRENALS: No adrenal nodules are seen.  KIDNEYS: Multiple bilateral intrarenal nonobstructing calculi. Small left renal probable cysts for which no further workup is needed. No hydronephrosis or asymmetric perinephric fat stranding.  LYMPH NODES: No retroperitoneal lymphadenopathy is seen.  VESSELS: Abdominal aorta is normal in caliber.  GI TRACT: Colonic diverticulosis. No obvious changes to suggest acute diverticulitis though the sigmoid colon and rectum are partially obscured by blood. No dilated small bowel loops. No bowel obstruction or ileus. Normal appendix is identified.  PELVIS:  There are tubal ligation clips. No urinary bladder distention.    There is dressing material associated with what may represent a laparoscopy port in the right upper mid abdomen. There are some hyperdense material extending from the rectus muscle to the skin which could represent blood. There is some gas bubbles at the level of the umbilicus, extraperitoneal which may represent postsurgical change.    No free air or bowel obstruction.  ___________________________________    Impression    1. Hematoma associated with the gallbladder fossa with fluid tracking inferiorly.      2. Moderate volume hemoperitoneum, more so blood lying in the dependent pelvis, though also around the liver and spleen and pericolic gutters.    3. Other findings as described above.      Radiologist location ID: [id number]     CT ABDOMEN PELVIS WO IV CONTRAST     Status: None    Narrative    Samani JEAN Metivier    CT ABDOMEN PELVIS WO IV CONTRAST performed on 07/13/2024 8:53 AM.    INDICATION:  abdomen more distended and tender   Additional History:  Abdominal pain    TECHNIQUE:  Unenhanced CT abdomen and pelvis with axial, coronal, and sagittal multiplanar reformations. Dose modulation, automated exposure control, and/or iterative reconstruction were used for dose reduction.    COMPARISON:  CT of the abdomen and pelvis 07/12/2024  ___________________________________  FINDINGS:    The lower chest shows coronary artery calcifications and presumed postsurgical changes of CABG.    The liver is enlarged. Mild periportal edema is evident. A hematoma in the gallbladder fossa is grossly similar compared to the prior study with a small amount of hemoperitoneum tracking inferiorly. The hemoperitoneum itself is slightly decreased in volume. There is a small amount of perihepatic fluid similar compared to prior study. Hemoperitoneum deeper in the pelvis remains moderate slightly decreased in overall volume.    The adrenal glands and pancreas are unremarkable. The spleen is enlarged. Bilateral nephrolithiasis is evident. The bladder is distended. The uterus is grossly normal. Bilateral tubal ligation clips are evident. Colonic diverticulosis is evident. No acute abnormality of the bowel is seen.    Atherosclerosis is seen throughout the aorta and its branches without aneurysm.    Postsurgical changes are seen throughout the anterior abdominal wall with a few small subcutaneous hematomas, grossly similar compared to the prior study. No acute skeletal abnormality is seen. ___________________________________    Impression     1. Grossly stable hematoma in the gallbladder fossa with slight decrease in volume of hemoperitoneum.  2. Additional stable findings as above.        Radiologist location ID: [id number]     CT BRAIN WO IV CONTRAST     Status: Abnormal    Narrative    Madora JEAN Heick    CT BRAIN WO IV CONTRAST performed on 07/15/2024 8:16 AM.    INDICATION:  Fall  Additional History:  fall, nausea, weakness    TECHNIQUE:  Unenhanced CT head with axial, coronal, and sagittal multiplanar reformations. Dose modulation, automated exposure control, and/or iterative reconstruction were used for dose reduction.    COMPARISON: June 14, 2024    FINDINGS:  There is a small amount of acute blood products in the subdural space overlying the left frontal lobe anteriorly and inferiorly. This measures 6 mm in thickness. A small collection of blood in the subdural space along the high convexity of the left frontal lobe is also seen. There is very mild mass effect on the left frontal lobe with no significant effacement of the ventricular system, edema or midline shift.    Elsewhere, the brain, ventricles and subarachnoid spaces are within normal limits. There is no evidence of acute large vessel infarct. No acute blood products are seen elsewhere. The calvarium appears intact.              Impression    Small volume of acute hemorrhage in the subdural space overlying the left frontal lobe anteriorly and superiorly as detailed above. No significant change otherwise seen.  .  .  .  s/d/g    A Critical Red actionable finding has been sent via the [employer] Actionable Findings application on 07/15/2024 8:26 AM, Message ID [id number]. Receipt of this communication will be communicated to [facility] ED RADIOLOGY STAFF or responsible provider and will be documented in [employer] Actionable Findings System upon receiving the acknowledgement.    A Critical Red actionable finding has been sent via the [employer] Actionable Findings application on  07/15/2024 8:26 AM, Message ID [id number]. Receipt of this communication will be communicated to [facility] RADIOLOGY STAFF or responsible provider and will be documented in [employer] Actionable Findings System upon receiving the acknowledgement.      Radiologist location ID: [id number]         Physical Exam:  Physical Exam  Vitals reviewed.   Constitutional:       Appearance: Normal appearance. She is normal weight.   HENT:      Head: Normocephalic.      Nose: Nose normal.      Mouth/Throat:      Pharynx: Oropharynx is clear.   Eyes:      Extraocular Movements: Extraocular movements intact.      Conjunctiva/sclera: Conjunctivae normal.   Cardiovascular:      Rate and Rhythm: Normal rate.      Pulses: Normal pulses.   Pulmonary:      Effort: Pulmonary effort is normal.   Abdominal:      Palpations: Abdomen is soft.   Musculoskeletal:         General: Normal range of motion.      Cervical back: Normal range of motion.   Skin:     General: Skin is dry.   Neurological:      Mental Status: She is alert. She is disoriented.   Psychiatric:         Cognition and Memory: Cognition is impaired. Memory is impaired.       Problem List:  Active Hospital Problems   (*Primary Problem)    Diagnosis    *Acute hypoxic respiratory failure (CMS HCC)    Subdural hemorrhage (CMS HCC)    NSVT (nonsustained ventricular tachycardia) (CMS HCC)    Heart failure, unspecified HF chronicity, unspecified heart failure type (CMS HCC)    Acute cholecystitis    Thickening of wall of gallbladder with pericholecystic fluid    Symptomatic anemia  COVID-19    Acute blood loss anemia    Transaminitis    Malignant neoplasm of lung, unspecified laterality, unspecified part of lung (CMS HCC)    Pulmonary HTN (CMS HCC)    Pneumonia due to COVID-19 virus    Mood disorder (CMS HCC)    Anemia, unspecified type    Cigarette smoker    Metastatic cancer to liver    History of breast cancer     Chronic    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)     Paroxysmal atrial fibrillation (CMS HCC)     Chronic    Hypertension     Chronic    CAD (coronary artery disease)      Assessment/Plan:    68 year old female presenting with acute hypoxic respiratory failure:     Acute hypoxic respiratory failure:  CXR revealing vague pulmonary opacities that could be mild PNA or atelectasis.  She has been COVID positive this admission.  CTA chest without evidence of PE.  She is on p.o. antibiotics.    Small-cell lung CA with liver metastasis:  Follows with Medical Oncology on outpatient basis at [facility].  She was to receive carboplatin/etoposide/Tecentriq on 07/08/2024 when she presented with acute respiratory symptoms that prompted further evaluation by the emergency room as stated in the above HPI.  Withhold current chemotherapy regimen until resolution of acute conditions.    Normocytic hypochromic anemia:   Current H/H = 7.9/25.0.  Has received multiple PRBC transfusions since being admitted.  Could be secondary to acute illness or metastatic disease.  Noted hemoperitoneum per recent CT abdomen/pelvis.  Vitamin B12 normal at 1106.  Folate low at 3.5.  On daily folic acid .  Copper normal at 113.  Noted transaminitis as well as elevated alk-phos.  This is likely related to liver mets.  Continue to monitor counts.  Transfuse with PRBC if hemoglobin less than 7 grams/deciliter or greater than 7 and symptomatic.  Will benefit from blood transfusion.    Thrombocytopenia:  Current counts = 41,000.  She is s/p recent fall, with CT head revealing small subdural hemorrhage and associated mental status change.  Currently in ICU for closer monitoring and management.  Defer further thrombocytopenia panel at this time.  Continue to monitor counts.  Transfuse with platelets if <50 K and actively bleeding or <10 K w/wo bleeding.  Noted slightly elevated PT/INR.  Normal PTT and fibrinogen.  She has received FFP transfusion since being admitted, most recently being  07/14/2024.    Subdural hemorrhage: As above, as evidenced by CT head.  Neurosurgery and neurology following.  Keep platelets greater than 100,000. Transfuse FFPs if INR greater than 1.5    Cholelithiasis: General surgery is following.  She is s/p cholecystectomy  07/11/2024.  Noted hemoperitoneum per recent CT abdomen/pelvis.  General surgery without further surgical intervention at this time - recommending serial H/H checks.    Hypertension:  Antihypertensives held this morning due to noted hypotension and in light of recent bleed, mentation change.    HLD:  On statin.    CAD:  History of CABG x1.  81 mg ASA held at this time.      Rosina Ice, APRN, CNP        Attending note:  I have independently seen and examined the patient on the same date of service as our mid-level.  I agree with the plan of care as documented by my mid-level.  I have personally reviewed  the pertinent  medications, labs  and radiographic studies  as documented in my mid-level note.  The case was discussed in detail with the midlevel provider.  I agree with the above note.  Some changes and additions were made.  I spent more than 50% of the total encounter time for the care of this patient and documentation.      Norleen Amen, MD

## 2024-07-15 NOTE — Progress Notes (Signed)
 [facility] Coverage Note          Name:  [name] MRN:  [id number]   Admission Date: 07/08/2024   DOB:  01/24/1956 (68 y.o.)   Date of service: 07/15/2024       Received notification from nursing staff that patient was found on the floor of the bathroom.  She reported that she did not fall but was tired and needed to sit down.  She was noted to previously be alert and oriented to person, place, time, and situation.  She refused bed alarm earlier in the day.  She was also advised by nursing staff to use her call bell, which she did not prior to getting up.  They did not notice any apparent injury.    Overnight Coverage Note:  Otherwise no change in plan of care.  Continue current medical management and supportive care.  Will defer further management decisions to primary team in the morning.    Dasie Chancellor Danielle Lainee Lehrman, APRN, CNP  07/15/2024, 06:36

## 2024-07-15 NOTE — Nurses Notes (Signed)
 Nurse and PCT was in room separately at 0300 and 0345 for vitals and to check on wound dressing. Each time we asked if pt needed to go to bathroom and pt stated no. I went in around 68 yo check again and patient was on floor of bathroom. She stated she did not fall but was tired and needed to sit b  Down. She did not use the call bell which was by her side. Earlier in the day she refused her bed alarm bc she said it goes off everytime she moves and drives her crazy, she promised to call out if needed. This incident was unwitnessed so it hadf to happen between 0345 ansd 0445. She was cleaned up and put back in bed, Bed alarm ON

## 2024-07-15 NOTE — Nurses Notes (Signed)
 Report called to ICU

## 2024-07-16 ENCOUNTER — Other Ambulatory Visit: Payer: Self-pay

## 2024-07-16 ENCOUNTER — Inpatient Hospital Stay (HOSPITAL_COMMUNITY)

## 2024-07-16 DIAGNOSIS — I6200 Nontraumatic subdural hemorrhage, unspecified: Secondary | ICD-10-CM

## 2024-07-16 DIAGNOSIS — Z7952 Long term (current) use of systemic steroids: Secondary | ICD-10-CM

## 2024-07-16 DIAGNOSIS — K7689 Other specified diseases of liver: Secondary | ICD-10-CM

## 2024-07-16 DIAGNOSIS — Z79899 Other long term (current) drug therapy: Secondary | ICD-10-CM

## 2024-07-16 LAB — BASIC METABOLIC PANEL
ANION GAP: 15 mmol/L (ref 7–18)
BUN/CREA RATIO: 47
BUN: 54 mg/dL — ABNORMAL HIGH (ref 8–23)
CALCIUM: 8.3 mg/dL (ref 8.3–10.7)
CHLORIDE: 107 mmol/L — ABNORMAL HIGH (ref 96–106)
CO2 TOTAL: 23 mmol/L (ref 22–30)
CREATININE: 1.16 mg/dL — ABNORMAL HIGH (ref 0.50–0.90)
ESTIMATED GFR: 52 mL/min/1.73mˆ2 — ABNORMAL LOW (ref >90)
GLUCOSE: 114 mg/dL — ABNORMAL HIGH (ref 74–109)
POTASSIUM: 3.6 mmol/L (ref 3.2–5.0)
SODIUM: 145 mmol/L — ABNORMAL HIGH (ref 133–144)

## 2024-07-16 LAB — PT/INR
INR: 1.59 — ABNORMAL HIGH (ref 0.86–1.14)
PROTHROMBIN TIME: 19.2 s — ABNORMAL HIGH (ref 12.1–15.3)

## 2024-07-16 LAB — PRODUCT: PLATELETS - UNITS
UNIT DIVISION: 0
UNIT DIVISION: 0

## 2024-07-16 LAB — PLATELET COUNT: PLATELETS: 46 x10ˆ3/uL — ABNORMAL LOW (ref 150–400)

## 2024-07-16 LAB — HEPATIC FUNCTION PANEL
ALBUMIN: 2.8 g/dL — ABNORMAL LOW (ref 3.5–5.2)
ALKALINE PHOSPHATASE: 451 U/L — ABNORMAL HIGH (ref 35–129)
ALT (SGPT): 502 U/L — ABNORMAL HIGH (ref 0–33)
AST (SGOT): 950 U/L — ABNORMAL HIGH (ref 0–32)
BILIRUBIN DIRECT: 3.3 mg/dL — ABNORMAL HIGH (ref 0.0–0.3)
BILIRUBIN TOTAL: 4.7 mg/dL — ABNORMAL HIGH (ref 0.2–1.2)
PROTEIN TOTAL: 5 g/dL — ABNORMAL LOW (ref 6.4–8.3)

## 2024-07-16 LAB — MAGNESIUM: MAGNESIUM: 1.9 mg/dL (ref 1.6–2.4)

## 2024-07-16 LAB — CBC
HCT: 20.4 % — ABNORMAL LOW (ref 34.8–46.0)
HGB: 6.5 g/dL — CL (ref 11.5–16.0)
MCH: 28.5 pg (ref 26.0–32.0)
MCHC: 31.9 g/dL (ref 31.0–35.5)
MCV: 89.5 fL (ref 78.0–100.0)
MPV: 11.5 fL (ref 8.7–12.5)
PLATELETS: 40 x10ˆ3/uL — ABNORMAL LOW (ref 150–400)
RBC: 2.28 x10ˆ6/uL — ABNORMAL LOW (ref 3.85–5.22)
RDW-CV: 16.9 % — ABNORMAL HIGH (ref 11.5–15.5)
WBC: 8.2 x10ˆ3/uL (ref 3.7–11.0)

## 2024-07-16 LAB — PHOSPHORUS: PHOSPHORUS: 2.7 mg/dL (ref 2.5–4.5)

## 2024-07-16 MED ORDER — SODIUM CHLORIDE 0.9 % IV BOLUS
40.0000 mL | INJECTION | Freq: Once | Status: AC | PRN
Start: 2024-07-16 — End: 2024-07-16

## 2024-07-16 MED ORDER — GABAPENTIN 300 MG CAPSULE
300.0000 mg | ORAL_CAPSULE | Freq: Three times a day (TID) | ORAL | Status: DC
Start: 2024-07-16 — End: 2024-07-17
  Administered 2024-07-16: 300 mg via ORAL
  Administered 2024-07-16: 0 mg via ORAL
  Filled 2024-07-16: qty 1

## 2024-07-16 NOTE — Progress Notes (Signed)
 [facility] Progress Note    Jayelyn Barno       68 y.o.       Date of service: 07/16/2024  Date of Admission:  07/08/2024    Hospital Day:  LOS: 8 days   CC: Acute hypoxic respiratory failure (CMS HCC)    Subjective:  Patient was seen and examined at bedside in ICU this morning.  She complained of abdomen pain, denied headache, blurry vision nausea or vomiting.  She appeared more icteric today.    Her hemoglobin has dropped to 6.5 again, platelet count 40 K. she received 2 units platelets yesterday .  CT head was repeated today which showed stable subdural hemorrhage.      Objective:   Filed Vitals:    07/16/24 0945 07/16/24 1000 07/16/24 1015 07/16/24 1030   BP: 110/80 118/65 128/70 119/70   Pulse: 79 85 86 84   Resp: (!) 24 (!) 21 (!) 22 (!) 25   Temp:    36.8 C (98.3 F)   SpO2: 91% 93% 91% 90%     Oxygen Device  SpO2: 90 %  $ O2 Delivery: Nasal Cannula  Flow (L/min) (Oxygen Therapy): 3   Flow (L/Min) (NICU-PICU): 2 LPM    albuterol  (PROVENTIL ) 2.5 mg / 3 mL (0.083%) neb solution, 2.5 mg, Nebulization, 4x/day  aluminum-magnesium  hydroxide-simethicone (MAG-AL PLUS) 200-200-20 mg per 5 mL oral liquid, 30 mL, Oral, Q4H PRN  amiodarone  (CORDARONE ) tablet, 200 mg, Oral, Daily with Breakfast  anastrozole  (ARIMIDEX ) tablet, 1 mg, Oral, Daily  budesonide (PULMICORT RESPULES) 0.5 mg/2 mL nebulizer suspension, 1 mg, Nebulization, 2x/day   And  arformoterol (BROVANA) 15 mcg/2 mL nebulizer solution, 15 mcg, Nebulization, 2x/day  atorvastatin  (LIPITOR) tablet, 40 mg, Oral, Daily  benzonatate (TESSALON) capsule, 100 mg, Oral, Q8H PRN  cefpodoxime (VANTIN) tablet, 400 mg, Oral, 2x/day-Food  cholecalciferol  (VITAMIN D3) 1000 unit (25 mcg) tablet, 1,000 Units, Oral, Daily  clonazePAM  (klonoPIN ) tablet, 0.5 mg, Oral, 2x/day PRN  NS 250 mL flush bag, , Intravenous, Q15 Min PRN   And  D5W 250 mL flush bag, , Intravenous, Q15 Min PRN  dexAMETHasone  (DECADRON ) tablet 6 mg, 6 mg, Oral,  Daily  diphenhydrAMINE (BENADRYL) capsule, 25 mg, Oral, HS PRN  DULoxetine  (CYMBALTA ) delayed release capsule, 60 mg, Oral, Daily  [Held by provider] enoxaparin PF (LOVENOX) 40 mg/0.4 mL SubQ injection, 40 mg, Subcutaneous, Q24H  fluticasone  (FLONASE ) 50 mcg per spray nasal spray, 1 Spray, Each Nostril, Daily PRN  folic acid  (FOLVITE ) tablet, 1 mg, Oral, Daily  gabapentin  (NEURONTIN ) capsule, 300 mg, Oral, 3x/day  HYDROcodone -acetaminophen  (NORCO) 5-325 mg per tablet, 1 Tablet, Oral, Q6H PRN  HYDROmorphone  (DILAUDID ) 0.5 mg/0.5 mL injection, 0.5 mg, Intravenous, Q4H PRN  [Held by provider] losartan  (COZAAR ) tablet, 25 mg, Oral, Daily  magnesium  hydroxide (MILK OF MAGNESIA) 400mg  per 5mL oral liquid, 15 mL, Oral, 4x/day PRN  metoprolol  tartrate (LOPRESSOR ) tablet, 25 mg, Oral, 2x/day  multivitamin-minerals-iron  oral liquid, 15 mL, Oral, Daily  NS bolus infusion 40 mL, 40 mL, Intravenous, Once PRN  NS bolus infusion 40 mL, 40 mL, Intravenous, Once PRN  NS flush syringe, 3 mL, Intracatheter, Q8HRS  NS flush syringe, 3 mL, Intracatheter, Q1H PRN  OLANZapine  (zyPREXA ) tablet, 5 mg, Oral, NIGHTLY  omega-3 fatty acids (LOVAZA ) capsule, 1 g, Oral, Daily  ondansetron  (ZOFRAN ) 2 mg/mL injection, 4 mg, Intravenous, Q6H PRN  pantoprazole  (PROTONIX ) delayed release tablet, 40 mg, Oral, Daily Before Lunch  polyethylene glycol (MIRALAX) oral packet, 17  g, Oral, Daily  prochlorperazine  (COMPAZINE ) tablet, 10 mg, Oral, 3x/day PRN  traZODone  (DESYREL ) tablet, 150 mg, Oral, NIGHTLY        Physical Exam:  General:  Chronically ill-appearing female in no acute distress, icterus present  Cardiac: Regular rate and rhythm, no murmur auscultated.  Respiratory: Clear to auscultation bilaterally , no wheeze, rhonchi  Abdomen:soft, right lower abdomen laparoscopic incisions healing well, mild tenderness present in lower abdomen, no organomegaly  Neurological: Alert and Orient X 3; No gross focal neurological deficits  Extremities: No  peripheral edema noted       Results for orders placed or performed during the hospital encounter of 07/08/24 (from the past 24 hours)   PRODUCT: FFP/PLASMA - UNITS : 2 Units    Collection Time: 07/15/24  6:00 PM   Result Value Ref Range    COMMUNICATION LOG       NOTIFIED MORGAN/ICU THAT TP IS READY FOR PT. 8182 07/15/24.CS    Coding System [id number]     UNIT NUMBER [id number]     BLOOD COMPONENT TYPE THAWED PLASMA     UNIT DIVISION 00     UNIT DISPENSE STATUS ISSUED     TRANSFUSION STATUS OK TO TRANSFUSE     Product Code E2121V00     Coding System [id number]     UNIT NUMBER [id number]     BLOOD COMPONENT TYPE THAWED PLASMA     UNIT DIVISION 00     UNIT DISPENSE STATUS ISSUED,FINAL     TRANSFUSION STATUS OK TO TRANSFUSE     Product Code [id number]    CBC    Collection Time: 07/16/24  4:45 AM   Result Value Ref Range    WBC 8.2 3.7 - 11.0 x10^3/uL    RBC 2.28 (L) 3.85 - 5.22 x10^6/uL    HGB 6.5 (LL) 11.5 - 16.0 g/dL    HCT 79.5 (L) 65.1 - 46.0 %    MCV 89.5 78.0 - 100.0 fL    MCH 28.5 26.0 - 32.0 pg    MCHC 31.9 31.0 - 35.5 g/dL    RDW-CV 83.0 (H) 88.4 - 15.5 %    PLATELETS 40 (L) 150 - 400 x10^3/uL    MPV 11.5 8.7 - 12.5 fL   BASIC METABOLIC PANEL    Collection Time: 07/16/24  4:45 AM   Result Value Ref Range    SODIUM 145 (H) 133 - 144 mmol/L    POTASSIUM 3.6 3.2 - 5.0 mmol/L    CHLORIDE 107 (H) 96 - 106 mmol/L    CO2 TOTAL 23 22 - 30 mmol/L    ANION GAP 15 7 - 18 mmol/L    CALCIUM 8.3 8.3 - 10.7 mg/dL    GLUCOSE 885 (H) 74 - 109 mg/dL    BUN 54 (H) 8 - 23 mg/dL    CREATININE 8.83 (H) 0.50 - 0.90 mg/dL    BUN/CREA RATIO 47     ESTIMATED GFR 52 (L) >90 mL/min/1.89m^2   MAGNESIUM     Collection Time: 07/16/24  4:45 AM   Result Value Ref Range    MAGNESIUM  1.9 1.6 - 2.4 mg/dL   PHOSPHORUS    Collection Time: 07/16/24  4:45 AM   Result Value Ref Range    PHOSPHORUS 2.7 2.5 - 4.5 mg/dL   HEPATIC FUNCTION PANEL    Collection Time: 07/16/24  4:45 AM   Result Value Ref Range    ALBUMIN 2.8 (L) 3.5 - 5.2 g/dL    ALKALINE  PHOSPHATASE 451 (  H) 35 - 129 U/L    ALT (SGPT) 502 (H) 0 - 33 U/L    AST (SGOT) 950 (H) 0 - 32 U/L    BILIRUBIN TOTAL 4.7 (H) 0.2 - 1.2 mg/dL    BILIRUBIN DIRECT 3.3 (H) 0.0 - 0.3 mg/dL    PROTEIN TOTAL 5.0 (L) 6.4 - 8.3 g/dL   CROSSMATCH RED CELLS - UNITS , 1 Units    Collection Time: 07/16/24  5:45 AM   Result Value Ref Range    Coding System [id number]     UNIT NUMBER [id number]     BLOOD COMPONENT TYPE LR RBC, Adsol1, 04710     UNIT DIVISION 00     UNIT DISPENSE STATUS ISSUED     TRANSFUSION STATUS OK TO TRANSFUSE     IS CROSSMATCH Electronically Compatible     Product Code [id number]    TYPE AND SCREEN    Collection Time: 07/16/24  5:45 AM   Result Value Ref Range    UNITS ORDERED 1     ABO/RH(D) O POSITIVE     ANTIBODY SCREEN NEGATIVE     SPECIMEN EXPIRATION DATE 07/15/2024,2359    PT/INR    Collection Time: 07/16/24  9:17 AM   Result Value Ref Range    PROTHROMBIN TIME 19.2 (H) 12.1 - 15.3 seconds    INR 1.59 (H) 0.86 - 1.14    Narrative    In the setting of warfarin therapy, a moderate-intensity INR goal range is 2.0 to 3.0 and a high-intensity INR goal range is 2.5 to 3.5.    INR is ONLY validated to determine the level of anticoagulation with vitamin K antagonists (warfarin). Other factors may elevate the INR including but not limited to direct oral anticoagulants (DOACs), liver dysfunction, vitamin K deficiency, DIC, factor deficiencies, and factor inhibitors.   PRODUCT: PLATELETS - UNITS , 1 Units    Collection Time: 07/16/24  9:45 AM   Result Value Ref Range    Coding System [id number]     UNIT NUMBER [id number]     BLOOD COMPONENT TYPE PATHOGEN REDUCED PLASMA REDUCED1     UNIT DIVISION 00     UNIT DISPENSE STATUS ALLOCATED     TRANSFUSION STATUS OK TO TRANSFUSE     Product Code [id number]          Micro: No results found for any visits on 07/08/24 (from the past 96 hours).    Radiology:    Results for orders placed or performed during the hospital encounter of 07/08/24 (from the past 24 hours)   CT BRAIN  WO IV CONTRAST     Status: None    Narrative    Keisy JEAN Brockmann    CT BRAIN WO IV CONTRAST performed on 07/16/2024 6:33 AM.    INDICATION:  Subdural hematoma   Additional History:  Subdural hematoma    TECHNIQUE:  Unenhanced CT head with axial, coronal, and sagittal multiplanar reformations. Dose modulation, automated exposure control, and/or iterative reconstruction were used for dose reduction.    COMPARISON: Previous day    FINDINGS:  Small amount of acute blood in the subdural space overlying the left frontal lobe anteriorly and also superiorly is stable in appearance. No significant mass effect or midline shift.    No acute blood products are seen elsewhere. No adverse interval change is seen.              Impression    Stable exam  .  .  .  s/d/g  Radiologist location ID: [id number]         Assessment/ Plan:   Assessment & Plan  Subdural hematoma (CMS HCC)  S/p fall on 07/14/2024 during night  Stat CT head done on 10/06 showed small volume of acute hemorrhage in the subdural space overlying the left frontal lobe anteriorly and superiorly  CT head repeated this morning showed stable subdural hemorrhage  Platelet count 40 K, received 2 units platelets yesterday   Will transfuse with 2 more units platelets today , needs platelet to be around 100 K  Neurosurgery evaluated patient, does not need any intervention   Appreciate neurology and neurosurgery input  Acute hypoxic respiratory failure (CMS HCC)  Pneumonia due to COVID-19 virus  Likely due to COVID and possible superimposed bacterial pneumonia  Currently on 2 L nasal cannula  Respiratory panel positive for COVID  CT angiogram did not show any evidence pulmonary embolism although marginal increase in the size of infrahilar mass, right hilar and mediastinal lymphadenopathy.  Continue p.o. Decadron  -will discontinue tomorrow, will complete 10 days  S/p IV Rocephin  Discontinue p.o. cefpodoxime-completed course  Continue Brovana and Pulmicort   Pulmonary  is following, appreciate input    Acute cholecystitis  Thickening of wall of gallbladder with pericholecystic fluid  S/p laparoscopic cholecystectomy on 10/02  Developed gallbladder fossa hematoma and hemoperitoneum  Tolerating regular diet   Appreciate General surgery input     Acute blood loss anemia  Acute blood loss anemia or acute on chronic anemia, and mild bleeding from 1 of the laparoscopic incisions  Likely secondary to acute bleeding and underlying metastatic disease  S/p vitamin K 10 mg IV x1 dose on 10/03,   S/p 2 units PRBC transfusion on 10/3  She also received 1 units platelets and 2 units FFP on 10/04   CT abdomen done on 10/03 evening showed hematoma associated with the gallbladder fossa with fluid tracking inferiorly, moderate volume hemoperitoneum  CT abdomen repeated on 10/04 due to patient complaining of worsening abdomen pain and distention, showed stable hematoma in gallbladder fossa with slight decrease in volume of hemoperitoneum  S/p suture of bleeding laparoscopic incision by General surgery on 10/05 and bleeding has been controlled   S/p 1 FFP and 1 unit PRBC on 10/05  2 units platelet transfusion on 10/06   Hemoglobin today 6.5, platelet count 40 K  Will transfuse 1 more unit PRBC today   Anemia panel not suggestive of iron  deficiency  Stool occult blood   Monitor CBC and transfuse for hemoglobin less than 7  General surgery and Hematology are following, appreciate input   NSVT (nonsustained ventricular tachycardia) (CMS HCC)  Patient developed nonsustained V-tach on 10/03   S/p amiodarone  drip   Continue amiodarone   200 mg p.o. 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)  Malignant neoplasm of lung, unspecified laterality, unspecified part of lung (CMS HCC)  Known history of small-cell carcinoma, planned for outpatient  chemotherapy with carboplatin/etoposide/tecenteriq  Oncology is following, recommended to withhold current chemotherapy regimen until acute illness resolves     CAD (coronary artery disease)  History of CABG x1   Has been on hold due to bleeding  Continue atorvastatin , metoprolol     Hypertension  Pressure control  Continue losartan , metoprolol   Paroxysmal atrial fibrillation (CMS HCC)  Rate controlled, continue metoprolol , not on anticoagulation  History of breast cancer  S/p mastectomy  Metastatic cancer to liver  Worsening metastatic liver disease likely causing elevated transaminases  Transaminitis  Likely secondary to liver metastasis  [name] more icterus today , LFTs remain elevated, total bilirubin increased from 2.6-4.7, conjugated bilirubin 3.3  General surgery has ordered repeat CTA abdomen/pelvis  Pulmonary HTN (CMS HCC)  Pulmonary is following  Mood disorder (CMS HCC)  Continue trazodone   Cigarette smoker     Deep vein thrombosis prophylaxis-SCD        Candyce Salles, MD

## 2024-07-16 NOTE — Assessment & Plan Note (Signed)
 Continue trazodone

## 2024-07-16 NOTE — Assessment & Plan Note (Signed)
 Likely secondary to liver metastasis  [name] more icterus today , LFTs remain elevated, total bilirubin increased from 2.6-4.7, conjugated bilirubin 3.3  General surgery has ordered repeat CTA abdomen/pelvis

## 2024-07-16 NOTE — Assessment & Plan Note (Signed)
 Rate controlled, continue metoprolol , not on anticoagulation

## 2024-07-16 NOTE — Progress Notes (Signed)
 [name]  Progress Note    [name]  Date of service: 07/16/2024   Date of Admission:  07/08/2024    Hospital Day:  LOS: 8 days   Subjective: confused    Interval History:    [name] is a 68 y.o. female w/recently diagnosed Small-cell carcinoma w/liver mets, COPD, current smoker - not on O2 at home, CABG. She was found to have lung mass on last admission and underwent bronchoscopy w/bx of station 7 which revealed the small cell carcinoma. She came to hospital from outpatient infusion [name] to start chemotherapy but had dyspnea and hypoxia and was sent to ER for further evaluation. She was found to have COVID. She reports she's been feeling bad for the last few weeks. CTA chest - no PE, marginal increase in size of right infrahilar mass, right hilar and mediastinal lymphadenopathy compared to prior study. CT A/P: hepatomegaly, hepatic steatosis, and hepatic lesions not much change, new pericholecystic fluid now present. We are consulted for acute hypoxic respiratory failure, known patient.      [name] currently on 2 L NC/93%.  She is awake and alert.  Feels breathing is doing okay at present time.  Has some mild cough.  No chest pain.  No fevers noted.  No N/V/D.  No associated symptoms or modifying factors.      10/2: Patient on 3LNC/96%. Awake and alert. Feels breathing is improving. She is s/p lap chole. No chest pain. No fevers noted. No n/v/d.  No associated symptoms or modifying factors.      10/3: Patient on room air/97%. Denies shortness of breath. Not much cough. No chest pain. Having bowel movements. No obvious signs of bleeding. Hgb 6.2 this morning, getting blood transfusion.  No associated symptoms or modifying factors.      10/4:  Today on 3L NC.  Received 2 units PRBCs yesterday.  Hemoglobin up to 8.1 today.  Receiving breathing treatment on exam.  Patient states she is not feeling much better today.    10/5:  On 2 L NC.  Hemoglobin dropped again this morning so  had to get 1 unit PRBC.     10/6: Patient on 2LNC/92%. Fell last night. Moved to ICU for confusion and found to have small volume of acute hemorrhage in subdural space overlying the left frontal lobe anteriorly and superiorly. She is awake and alert. A/Ox 2, oriented to person, place. Daughter at bedside. Denies blurry vision. MAE x4 w/no weakness.  No associated symptoms or modifying factors.     10/7 - did well overnight.  No new issues reported.  Hemoglobin 6.5 and getting PRBC x1.  No bleeding noted.  CT head today shows small stable SDH.  Afebrile.  UIP adequate.  On 3 L/min supplemental oxygen.    Vital Signs:  Temp  Avg: 36.8 C (98.2 F)  Min: 36.4 C (97.5 F)  Max: 37.1 C (98.8 F)    Pulse  Avg: 84.3  Min: 76  Max: 95 BP  Min: 79/68  Max: 131/78   Resp  Avg: 20.9  Min: 15  Max: 24 SpO2  Avg: 93.3 %  Min: 90 %  Max: 100 %          Input/Output    Intake/Output Summary (Last 24 hours) at 07/16/2024 0944  Last data filed at 07/16/2024 0930  Gross per 24 hour   Intake 1135 ml   Output --   Net 1135 ml  I/O last shift:  10/07 0700 - 10/07 1859  In: 325 [Blood:325]  Out: -    albuterol  (PROVENTIL ) 2.5 mg / 3 mL (0.083%) neb solution, 2.5 mg, Nebulization, 4x/day  aluminum-magnesium  hydroxide-simethicone (MAG-AL PLUS) 200-200-20 mg per 5 mL oral liquid, 30 mL, Oral, Q4H PRN  amiodarone  (CORDARONE ) tablet, 200 mg, Oral, Daily with Breakfast  anastrozole  (ARIMIDEX ) tablet, 1 mg, Oral, Daily  budesonide (PULMICORT RESPULES) 0.5 mg/2 mL nebulizer suspension, 1 mg, Nebulization, 2x/day   And  arformoterol (BROVANA) 15 mcg/2 mL nebulizer solution, 15 mcg, Nebulization, 2x/day  atorvastatin  (LIPITOR) tablet, 40 mg, Oral, Daily  benzonatate (TESSALON) capsule, 100 mg, Oral, Q8H PRN  cefpodoxime (VANTIN) tablet, 400 mg, Oral, 2x/day-Food  cholecalciferol  (VITAMIN D3) 1000 unit (25 mcg) tablet, 1,000 Units, Oral, Daily  clonazePAM  (klonoPIN ) tablet, 0.5 mg, Oral, 2x/day PRN  NS 250 mL flush bag, , Intravenous, Q15 Min  PRN   And  D5W 250 mL flush bag, , Intravenous, Q15 Min PRN  dexAMETHasone  (DECADRON ) tablet 6 mg, 6 mg, Oral, Daily  diphenhydrAMINE (BENADRYL) capsule, 25 mg, Oral, HS PRN  DULoxetine  (CYMBALTA ) delayed release capsule, 60 mg, Oral, Daily  [Held by provider] enoxaparin PF (LOVENOX) 40 mg/0.4 mL SubQ injection, 40 mg, Subcutaneous, Q24H  fluticasone  (FLONASE ) 50 mcg per spray nasal spray, 1 Spray, Each Nostril, Daily PRN  folic acid  (FOLVITE ) tablet, 1 mg, Oral, Daily  gabapentin  (NEURONTIN ) capsule, 400 mg, Oral, 3x/day  HYDROcodone -acetaminophen  (NORCO) 5-325 mg per tablet, 1 Tablet, Oral, Q6H PRN  HYDROmorphone  (DILAUDID ) 0.5 mg/0.5 mL injection, 0.5 mg, Intravenous, Q4H PRN  losartan  (COZAAR ) tablet, 25 mg, Oral, Daily  magnesium  hydroxide (MILK OF MAGNESIA) 400mg  per 5mL oral liquid, 15 mL, Oral, 4x/day PRN  metoprolol  tartrate (LOPRESSOR ) tablet, 25 mg, Oral, 2x/day  multivitamin-minerals-iron  oral liquid, 15 mL, Oral, Daily  NS bolus infusion 40 mL, 40 mL, Intravenous, Once PRN  NS bolus infusion 40 mL, 40 mL, Intravenous, Once PRN  NS flush syringe, 3 mL, Intracatheter, Q8HRS  NS flush syringe, 3 mL, Intracatheter, Q1H PRN  OLANZapine  (zyPREXA ) tablet, 5 mg, Oral, NIGHTLY  omega-3 fatty acids (LOVAZA ) capsule, 1 g, Oral, Daily  ondansetron  (ZOFRAN ) 2 mg/mL injection, 4 mg, Intravenous, Q6H PRN  pantoprazole  (PROTONIX ) delayed release tablet, 40 mg, Oral, Daily Before Lunch  polyethylene glycol (MIRALAX) oral packet, 17 g, Oral, Daily  prochlorperazine  (COMPAZINE ) tablet, 10 mg, Oral, 3x/day PRN  traZODone  (DESYREL ) tablet, 150 mg, Oral, NIGHTLY        Physical Exam:    General: Alert and oriented x2, chronically ill appearing, no acute distress  Eye: PERRL, EOMI.  There is some scleral icterus.  HENT: Normocephalic, normal hearing, moist oral mucosa, sinus tenderness  Neck: Supple, non-tender, no JVD, no lymphadenopathy  Lungs: Clear to auscultation and percussion, non-labored respiration  Heart: Normal  rate, regular rhythm, NSR on tele, no murmur, gallop, or edema  Abdomen: soft, non-tender, non-distended, normal bowel sounds, no masses  Musculoskeletal:  Normal range of motion and strength, no tenderness or swelling  Peripheral pulses:  2+ radial (L) and 2+ radial (R)  Skin: warm, dry, pink, and intact, no rashes or lesions  Neurological:  Awake, alert, and oriented x2, MAE, CNII-XII intact  Psychiatric:  Cooperative, appropriate mood and affect    Respiratory: 2LNC    Labs:     Results for orders placed or performed during the hospital encounter of 07/08/24 (from the past 24 hours)   PRODUCT: FFP/PLASMA - UNITS : 2 Units   Result Value  Ref Range    COMMUNICATION LOG       NOTIFIED [facility]/ICU THAT TP IS READY FOR PT. 8182 07/15/24.CS    Coding System [id number]     UNIT NUMBER [id number]     BLOOD COMPONENT TYPE THAWED PLASMA     UNIT DIVISION 00     UNIT DISPENSE STATUS ALLOCATED     TRANSFUSION STATUS OK TO TRANSFUSE     Product Code E2121V00     Coding System [id number]     UNIT NUMBER [id number]     BLOOD COMPONENT TYPE THAWED PLASMA     UNIT DIVISION 00     UNIT DISPENSE STATUS ISSUED,FINAL     TRANSFUSION STATUS OK TO TRANSFUSE     Product Code Z4451C99    CBC   Result Value Ref Range    WBC 8.2 3.7 - 11.0 x10^3/uL    RBC 2.28 (L) 3.85 - 5.22 x10^6/uL    HGB 6.5 (LL) 11.5 - 16.0 g/dL    HCT 79.5 (L) 65.1 - 46.0 %    MCV 89.5 78.0 - 100.0 fL    MCH 28.5 26.0 - 32.0 pg    MCHC 31.9 31.0 - 35.5 g/dL    RDW-CV 83.0 (H) 88.4 - 15.5 %    PLATELETS 40 (L) 150 - 400 x10^3/uL    MPV 11.5 8.7 - 12.5 fL   BASIC METABOLIC PANEL   Result Value Ref Range    SODIUM 145 (H) 133 - 144 mmol/L    POTASSIUM 3.6 3.2 - 5.0 mmol/L    CHLORIDE 107 (H) 96 - 106 mmol/L    CO2 TOTAL 23 22 - 30 mmol/L    ANION GAP 15 7 - 18 mmol/L    CALCIUM 8.3 8.3 - 10.7 mg/dL    GLUCOSE 885 (H) 74 - 109 mg/dL    BUN 54 (H) 8 - 23 mg/dL    CREATININE 8.83 (H) 0.50 - 0.90 mg/dL    BUN/CREA RATIO 47     ESTIMATED GFR 52 (L) >90 mL/min/1.59m^2    MAGNESIUM    Result Value Ref Range    MAGNESIUM  1.9 1.6 - 2.4 mg/dL   PHOSPHORUS   Result Value Ref Range    PHOSPHORUS 2.7 2.5 - 4.5 mg/dL   HEPATIC FUNCTION PANEL   Result Value Ref Range    ALBUMIN 2.8 (L) 3.5 - 5.2 g/dL    ALKALINE PHOSPHATASE 451 (H) 35 - 129 U/L    ALT (SGPT) 502 (H) 0 - 33 U/L    AST (SGOT) 950 (H) 0 - 32 U/L    BILIRUBIN TOTAL 4.7 (H) 0.2 - 1.2 mg/dL    BILIRUBIN DIRECT 3.3 (H) 0.0 - 0.3 mg/dL    PROTEIN TOTAL 5.0 (L) 6.4 - 8.3 g/dL   CROSSMATCH RED CELLS - UNITS , 1 Units   Result Value Ref Range    Coding System [id number]     UNIT NUMBER [id number]     BLOOD COMPONENT TYPE LR RBC, Adsol1, 04710     UNIT DIVISION 00     UNIT DISPENSE STATUS ISSUED     TRANSFUSION STATUS OK TO TRANSFUSE     IS CROSSMATCH Electronically Compatible     Product Code [id number]    TYPE AND SCREEN   Result Value Ref Range    UNITS ORDERED 1     ABO/RH(D) O POSITIVE     ANTIBODY SCREEN NEGATIVE     SPECIMEN EXPIRATION DATE 08/03/2024,2359    PT/INR   Result  Value Ref Range    PROTHROMBIN TIME 19.2 (H) 12.1 - 15.3 seconds    INR 1.59 (H) 0.86 - 1.14      Radiology:      Results for orders placed or performed during the hospital encounter of 07/08/24 (from the past 24 hours)   CT BRAIN WO IV CONTRAST     Status: None    Narrative    Zakyria JEAN Trudo    CT BRAIN WO IV CONTRAST performed on 07/16/2024 6:33 AM.    INDICATION:  Subdural hematoma   Additional History:  Subdural hematoma    TECHNIQUE:  Unenhanced CT head with axial, coronal, and sagittal multiplanar reformations. Dose modulation, automated exposure control, and/or iterative reconstruction were used for dose reduction.    COMPARISON: Previous day    FINDINGS:  Small amount of acute blood in the subdural space overlying the left frontal lobe anteriorly and also superiorly is stable in appearance. No significant mass effect or midline shift.    No acute blood products are seen elsewhere. No adverse interval change is seen.              Impression    Stable  exam  .  .  .  s/d/g        Radiologist location ID: [id number]          Assessment & Plan:  Active Hospital Problems    Diagnosis    Primary Problem: Acute hypoxic respiratory failure (CMS HCC)    Subdural hematoma (CMS HCC)    NSVT (nonsustained ventricular tachycardia) (CMS HCC)    Heart failure, unspecified HF chronicity, unspecified heart failure type (CMS HCC)    Acute cholecystitis    Thickening of wall of gallbladder with pericholecystic fluid    Symptomatic anemia    COVID-19    Acute blood loss anemia    Transaminitis    Malignant neoplasm of lung, unspecified laterality, unspecified part of lung (CMS HCC)    Pulmonary HTN (CMS HCC)    Pneumonia due to COVID-19 virus    Mood disorder (CMS HCC)    Anemia, unspecified type    Cigarette smoker    Metastatic cancer to liver    History of breast cancer    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)    Paroxysmal atrial fibrillation (CMS HCC)    Hypertension    CAD (coronary artery disease)      Plan:  Attending physician:  I have seen and examined the patient.  Medications, labs, images reviewed.    Pulmonary toilet, incentive spirometer, out of bed as tolerated.  Supplemental oxygen with nebs as needed.  CT head images reviewed and compared to prior available studies.  Stable small left frontal lobe subdural hematoma.  Continue to hold all anticoagulation.    Continue neuro checks.    Goal blood pressure SBP <150.  Monitor CBC and transfuse PRBC/platelets as needed.    Continue empiric antibiotics.  Continue steroids IV-->PO when more stable.    Limit sedating and deliriogenic medications.  Diet as tolerated.  Neurosurgery following.    Neurology following.  Oncology following.  General surgery is following - s/p robotic cholecystectomy.    [name] medically complex with increased risk for sudden decompensation and requires close monitoring of vitals, labs, and/or imaging.    Stable for transfer out of ICU to step-down.    Manus Free, DO    07/16/2024  16:46  Pulmonary and Critical Care Medicine

## 2024-07-16 NOTE — Care Management Notes (Signed)
 07/16/24 1045   Assessment Details   Assessment Type Admission   Insurance Information/Type   Insurance type Medicare   Living Environment   Lives With child(ren), adult   Living Arrangements house   Home Safety   Home Assessment: No Problems Identified   Home Accessibility no concerns   Care Management Plan   Discharge Planning Status initial meeting   Projected Discharge Date 07/22/24   Discharge plan discussed with: Patient   Discharge Needs Assessment   Equipment Currently Used at Home nebulizer   Equipment Needed After Discharge oxygen   Discharge Facility/Level of Care Needs Home with Home Health and DME (code 6)   Transportation Available car;family or friend will provide       Patient reports no discharge needs at this time. Current plan is to discharge home with family and home health, so that she can begin chemo treatments outpatient.     [name] agreeable to [facility], and [facility] for oxygen if necessary. She does not have oxygen at home and will need either a room air or prior to discharge.     Patient has been accepted by [facility]. Please provide face to face and home health order with discharge instructions. Report can be called to [phone] prior to discharge.     A signed patient choice form has been placed in the physical chart.

## 2024-07-16 NOTE — Care Plan (Signed)
 [facility] Nutrition Therapy Assessment      Reason for Assessment: Diagnosis    SUBJECTIVE :   Assessment due to dx resp failure in ICU. Pt resting in bed at time of visit. Pt reports she is drinking well, but has been eating 50% the last several days.  Pt is agreeable to try po supplement.  Pt reports UBW 136# and that she has gained wt.  Denies any N/V at this time.  Mild muscle/fat wasting note on NFPE.     OBJECTIVE:  68 y/o female with a dx of: resp failure, s/p fall, subdural hematoma, covid, PNA, anemia, s/p robotic chole, lung cancer with liver mets, transaminitis  PMH: abd pain, cancer, chest pain, CAD, HTN    Diet: reg/mech soft with ground meats  Intakes: pt reports 50% or less    UBW 136#  Skin: incision-abd    Labs: Lab Results Today:    Results for orders placed or performed during the hospital encounter of 07/08/24 (from the past 24 hours)   PRODUCT: FFP/PLASMA - UNITS : 2 Units   Result Value Ref Range    COMMUNICATION LOG       NOTIFIED [facility]/ICU THAT TP IS READY FOR PT. 1817 07/15/24.CS    Coding System [id number]     UNIT NUMBER [id number]     BLOOD COMPONENT TYPE THAWED PLASMA     UNIT DIVISION 00     UNIT DISPENSE STATUS ISSUED     TRANSFUSION STATUS OK TO TRANSFUSE     Product Code E2121V00     Coding System ISBT128     UNIT NUMBER [id number]     BLOOD COMPONENT TYPE THAWED PLASMA     UNIT DIVISION 00     UNIT DISPENSE STATUS ISSUED,FINAL     TRANSFUSION STATUS OK TO TRANSFUSE     Product Code [id number]    CBC   Result Value Ref Range    WBC 8.2 3.7 - 11.0 x10^3/uL    RBC 2.28 (L) 3.85 - 5.22 x10^6/uL    HGB 6.5 (LL) 11.5 - 16.0 g/dL    HCT 79.5 (L) 65.1 - 46.0 %    MCV 89.5 78.0 - 100.0 fL    MCH 28.5 26.0 - 32.0 pg    MCHC 31.9 31.0 - 35.5 g/dL    RDW-CV 83.0 (H) 88.4 - 15.5 %    PLATELETS 40 (L) 150 - 400 x10^3/uL    MPV 11.5 8.7 - 12.5 fL   BASIC METABOLIC PANEL   Result Value Ref Range    SODIUM 145 (H) 133 - 144 mmol/L    POTASSIUM 3.6 3.2 - 5.0 mmol/L     CHLORIDE 107 (H) 96 - 106 mmol/L    CO2 TOTAL 23 22 - 30 mmol/L    ANION GAP 15 7 - 18 mmol/L    CALCIUM 8.3 8.3 - 10.7 mg/dL    GLUCOSE 885 (H) 74 - 109 mg/dL    BUN 54 (H) 8 - 23 mg/dL    CREATININE 8.83 (H) 0.50 - 0.90 mg/dL    BUN/CREA RATIO 47     ESTIMATED GFR 52 (L) >90 mL/min/1.57m^2   MAGNESIUM    Result Value Ref Range    MAGNESIUM  1.9 1.6 - 2.4 mg/dL   PHOSPHORUS   Result Value Ref Range    PHOSPHORUS 2.7 2.5 - 4.5 mg/dL   HEPATIC FUNCTION PANEL   Result Value Ref Range    ALBUMIN 2.8 (L) 3.5 - 5.2  g/dL    ALKALINE PHOSPHATASE 451 (H) 35 - 129 U/L    ALT (SGPT) 502 (H) 0 - 33 U/L    AST (SGOT) 950 (H) 0 - 32 U/L    BILIRUBIN TOTAL 4.7 (H) 0.2 - 1.2 mg/dL    BILIRUBIN DIRECT 3.3 (H) 0.0 - 0.3 mg/dL    PROTEIN TOTAL 5.0 (L) 6.4 - 8.3 g/dL   CROSSMATCH RED CELLS - UNITS , 1 Units   Result Value Ref Range    Coding System [id number]     UNIT NUMBER [id number]     BLOOD COMPONENT TYPE LR RBC, Adsol1, 04710     UNIT DIVISION 00     UNIT DISPENSE STATUS ISSUED     TRANSFUSION STATUS OK TO TRANSFUSE     IS CROSSMATCH Electronically Compatible     Product Code [id number]    TYPE AND SCREEN   Result Value Ref Range    UNITS ORDERED 1     ABO/RH(D) O POSITIVE     ANTIBODY SCREEN NEGATIVE     SPECIMEN EXPIRATION DATE 08/05/2024,2359    PT/INR   Result Value Ref Range    PROTHROMBIN TIME 19.2 (H) 12.1 - 15.3 seconds    INR 1.59 (H) 0.86 - 1.14   PRODUCT: PLATELETS - UNITS , 1 Units   Result Value Ref Range    Coding System ISBT128     UNIT NUMBER [id number]     BLOOD COMPONENT TYPE PATHOGEN REDUCED PLASMA REDUCED1     UNIT DIVISION 00     UNIT DISPENSE STATUS ALLOCATED     TRANSFUSION STATUS OK TO TRANSFUSE     Product Code [id number]        Meds: albuterol  (PROVENTIL ) 2.5 mg / 3 mL (0.083%) neb solution, 2.5 mg, Nebulization, 4x/day  aluminum-magnesium  hydroxide-simethicone (MAG-AL PLUS) 200-200-20 mg per 5 mL oral liquid, 30 mL, Oral, Q4H PRN  amiodarone  (CORDARONE ) tablet, 200 mg, Oral, Daily with  Breakfast  anastrozole  (ARIMIDEX ) tablet, 1 mg, Oral, Daily  budesonide (PULMICORT RESPULES) 0.5 mg/2 mL nebulizer suspension, 1 mg, Nebulization, 2x/day   And  arformoterol (BROVANA) 15 mcg/2 mL nebulizer solution, 15 mcg, Nebulization, 2x/day  atorvastatin  (LIPITOR) tablet, 40 mg, Oral, Daily  benzonatate (TESSALON) capsule, 100 mg, Oral, Q8H PRN  cholecalciferol  (VITAMIN D3) 1000 unit (25 mcg) tablet, 1,000 Units, Oral, Daily  clonazePAM  (klonoPIN ) tablet, 0.5 mg, Oral, 2x/day PRN  NS 250 mL flush bag, , Intravenous, Q15 Min PRN   And  D5W 250 mL flush bag, , Intravenous, Q15 Min PRN  dexAMETHasone  (DECADRON ) tablet 6 mg, 6 mg, Oral, Daily  diphenhydrAMINE (BENADRYL) capsule, 25 mg, Oral, HS PRN  DULoxetine  (CYMBALTA ) delayed release capsule, 60 mg, Oral, Daily  [Held by provider] enoxaparin PF (LOVENOX) 40 mg/0.4 mL SubQ injection, 40 mg, Subcutaneous, Q24H  fluticasone  (FLONASE ) 50 mcg per spray nasal spray, 1 Spray, Each Nostril, Daily PRN  folic acid  (FOLVITE ) tablet, 1 mg, Oral, Daily  gabapentin  (NEURONTIN ) capsule, 300 mg, Oral, 3x/day  HYDROcodone -acetaminophen  (NORCO) 5-325 mg per tablet, 1 Tablet, Oral, Q6H PRN  HYDROmorphone  (DILAUDID ) 0.5 mg/0.5 mL injection, 0.5 mg, Intravenous, Q4H PRN  [Held by provider] losartan  (COZAAR ) tablet, 25 mg, Oral, Daily  magnesium  hydroxide (MILK OF MAGNESIA) 400mg  per 5mL oral liquid, 15 mL, Oral, 4x/day PRN  metoprolol  tartrate (LOPRESSOR ) tablet, 25 mg, Oral, 2x/day  multivitamin-minerals-iron  oral liquid, 15 mL, Oral, Daily  NS bolus infusion 40 mL, 40 mL, Intravenous, Once PRN  NS bolus infusion 40 mL, 40 mL,  Intravenous, Once PRN  NS flush syringe, 3 mL, Intracatheter, Q8HRS  NS flush syringe, 3 mL, Intracatheter, Q1H PRN  OLANZapine  (zyPREXA ) tablet, 5 mg, Oral, NIGHTLY  omega-3 fatty acids (LOVAZA ) capsule, 1 g, Oral, Daily  ondansetron  (ZOFRAN ) 2 mg/mL injection, 4 mg, Intravenous, Q6H PRN  pantoprazole  (PROTONIX ) delayed release tablet, 40 mg, Oral, Daily  Before Lunch  polyethylene glycol (MIRALAX) oral packet, 17 g, Oral, Daily  prochlorperazine  (COMPAZINE ) tablet, 10 mg, Oral, 3x/day PRN  traZODone  (DESYREL ) tablet, 150 mg, Oral, NIGHTLY         07/16/24 1133   Reason for Assessment   Reason For Assessment diagnosis/disease state   Diagnosis other (see comments)  (resp failure in ICU)   Nutrition/Diet History   Typical Intake (Food/Fluid/EN/PN) 50% or less per pt   Nutrition Interventions   Oral Nutrition Promotion calorie-dense liquids provided   RD Height and Weight Calculation Rows   Height Used for Calculations 1.549 m (5' 0.98)   Weight Used For Calculations 66.6 kg (146 lb 13.2 oz)   Ideal Body Weight (IBW)   Ideal Body Weight (IBW) (kg) 48.11   % Ideal Body Weight 138.43   Adjusted/Standard Body Weight   Adjusted BW 52kg   Body Mass Index (BMI)   BMI (kg/m2) 27.81   BMI Assessment BMI 25-29.9: overweight   Labs/Procedures/Meds   Lab Results Reviewed reviewed, pertinent   Medications   Pertinent Medications Reviewed reviewed, pertinent   Estimated Calorie Needs   Energy Calorie Requirements 1300-1560   Estimated Calorie Need Method kcal/kg  (25-30kcal/kg adj wt 52kg)   Estimated/Assessed Needs   Protein Requirements (gms/day) 52-62gm  (1-1.2gm/kg adj wt)   Nutrition Risk   Level of Risk moderate   Malnutrition (Undernutrition) Diagnosis   Malnutrition Chronicity Acute illness or Injury   Malnutrition Severity Moderate protein calorie malnutrition   Energy Intake Less than or equal to 50% of EER in greater than or equal to 5 days   Body Fat Loss Mild  (orbital)   Muscle Mass Loss Mild  (temple)   PES Statement   Problem Moderate Protein-Calorie Malnutrition   Pathophysiological/Situational Current medical condition   Signs/Symptoms Decreased oral intake  (mild muscle and fat loss)   Status New   Nutrition Intervention   Nutrition Intervention 2-->Assessment/reassessment   Nutrition Treatment Plan Monitor weight trends;Initiate oral supplement   RD/NDTR  Comments f/u 10/12   Work Units 2     Comments: Pt is receiving reg/mech soft diet with ground meats.  Fair/lower intakes noted. Pt agreeable to try ensure daily-chocolate.  Pt meets ASPEN criteria for malnutrition-see flowsheet.  Monitor wts, labs and intakes.  Will follow.      PLAN / INTERVENTION        Goals:  Meet estimated needs for Nutrients  Tolerate diet/ nutrition support    Monitor / Evaluate: 1. Intakes 2. Weights 3. labs    Recommend :   Reg/mech soft diet with ground meats  Ensure daily  Monitor wts, labs and intakes         Saddie Blow, RD 07/16/2024, 11:34    Ext [phone]              Problem: Malnutrition  Goal: Improved Nutritional Intake  Intervention: Promote and Optimize Oral Intake  Flowsheets (Taken 07/16/2024 1133)  Oral Nutrition Promotion: calorie-dense liquids provided  Note: Malnutrition due to decreased intakes x 5 days and mold muscle/fat loss

## 2024-07-16 NOTE — Assessment & Plan Note (Signed)
 S/p laparoscopic cholecystectomy on 10/02  Developed gallbladder fossa hematoma and hemoperitoneum  Tolerating regular diet   Appreciate General surgery input

## 2024-07-16 NOTE — Consults (Signed)
 [facility]  Hematology/Oncology    Progress Note      January, Bergthold, 68 y.o. 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  S/p fall which warranted CT scan of head, revealing subdural hemorrhage overlying left frontal lobe anteriorly and superiorly.  Noted mentation status change as well.  Neurosurgery and Neurology have been consulted. Hgb low this morning at 6.5.  Receiving 1u PRBC this morning.    Review of Systems:   Review of Systems   Constitutional:  Negative for activity change, appetite change, chills, diaphoresis, fatigue, fever and unexpected weight change.   HENT:  Negative for congestion, ear pain, facial swelling, hearing loss, mouth sores, nosebleeds, sinus pain, sore throat, tinnitus, trouble swallowing and voice change.    Eyes:  Negative for photophobia, pain and visual disturbance.   Respiratory:  Negative for apnea, cough, choking, chest tightness, shortness of breath, wheezing and stridor.    Cardiovascular:  Negative for chest pain, palpitations and leg swelling.   Gastrointestinal:  Negative for abdominal distention, abdominal pain, anal bleeding, blood in stool, constipation, diarrhea, nausea, rectal pain and vomiting.   Endocrine: Negative for cold intolerance and heat intolerance.   Genitourinary:  Negative for decreased urine volume, difficulty urinating, dysuria, flank pain, frequency, hematuria and urgency.   Musculoskeletal:  Negative for arthralgias, back pain, gait problem, joint swelling, myalgias and neck pain.   Skin:  Negative for color change, pallor, rash and wound.   Allergic/Immunologic: Negative for immunocompromised state.   Neurological:  Positive for weakness. Negative for dizziness, syncope, facial asymmetry, speech difficulty, light-headedness,  numbness and headaches.   Hematological:  Negative for adenopathy. Does not bruise/bleed easily.   Psychiatric/Behavioral:  Positive for confusion. Negative for agitation and behavioral problems.      Past Medical History:   Diagnosis Date    Abdominal pain     Cancer (CMS HCC)     Chest pain     Chronic lung disease     Coronary artery disease     Dyslipidemia     Essential hypertension     Heart disease     History of percutaneous coronary intervention     History of stress test     Mixed hyperlipidemia     Unspecified chronic bronchitis      Past Surgical History:   Procedure Laterality Date    CARDIAC CATHETERIZATION      CORONARY ANGIOPLASTY      CORONARY ARTERY STENT PLACEMENT      HX BACK SURGERY      HX CORONARY ARTERY BYPASS GRAFT      HX MASTECTOMY, SIMPLE Bilateral     HX TUBAL LIGATION      NECK SURGERY       Social History     Socioeconomic History    Marital status: Widowed     Spouse name: Not on file    Number of children: Not on file    Years of education: Not on file    Highest education level: Not on file   Occupational History    Not on file   Tobacco Use    Smoking status: Every Day     Current packs/day: 0.50     Average packs/day: 0.5 packs/day for  52.8 years (26.4 ttl pk-yrs)     Types: Cigarettes     Start date: 62    Smokeless tobacco: Never   Vaping Use    Vaping status: Never Used   Substance and Sexual Activity    Alcohol use: Yes     Comment: occasional    Drug use: Yes     Types: Marijuana    Sexual activity: Not on file   Other Topics Concern    Not on file   Social History Narrative    Not on file     Social Determinants of Health     Financial Resource Strain: Not on file   Transportation Needs: Not on file   Social Connections: Low Risk (07/09/2024)    Social Connections     SDOH Social Isolation: 5 or more times a week   Intimate Partner Violence: Not on file   Housing Stability: Not on file        Family Medical History:       Problem Relation (Age of Onset)    Arthritis Mother,  Maternal Grandmother    Cancer Father, Other    Heart Attack Father, Other    Heart Disease Brother, Other    Hypertension (High Blood Pressure) Mother, Father, Other    Pacemaker Mother          Vital Signs:  Temp  Avg: 36.8 C (98.2 F)  Min: 36.4 C (97.5 F)  Max: 37.1 C (98.8 F)    Pulse  Avg: 84.4  Min: 76  Max: 95 BP  Min: 79/68  Max: 131/78   Resp  Avg: 20.8  Min: 15  Max: 24 SpO2  Avg: 93.4 %  Min: 90 %  Max: 100 %          Input/Output    Intake/Output Summary (Last 24 hours) at 07/16/2024 0933  Last data filed at 07/15/2024 2100  Gross per 24 hour   Intake 810 ml   Output --   Net 810 ml    I/O last shift:  No intake/output data recorded.     LABS:   Results for orders placed or performed during the hospital encounter of 07/08/24 (from the past 24 hours)   PRODUCT: FFP/PLASMA - UNITS : 2 Units   Result Value Ref Range    COMMUNICATION LOG       NOTIFIED MORGAN/ICU THAT TP IS READY FOR PT. 1817 07/15/24.CS    Coding System [id number]     UNIT NUMBER [id number]     BLOOD COMPONENT TYPE THAWED PLASMA     UNIT DIVISION 00     UNIT DISPENSE STATUS ALLOCATED     TRANSFUSION STATUS OK TO TRANSFUSE     Product Code E2121V00     Coding System ISBT128     UNIT NUMBER [id number]     BLOOD COMPONENT TYPE THAWED PLASMA     UNIT DIVISION 00     UNIT DISPENSE STATUS ISSUED,FINAL     TRANSFUSION STATUS OK TO TRANSFUSE     Product Code [id number]    CBC   Result Value Ref Range    WBC 8.2 3.7 - 11.0 x10^3/uL    RBC 2.28 (L) 3.85 - 5.22 x10^6/uL    HGB 6.5 (LL) 11.5 - 16.0 g/dL    HCT 79.5 (L) 65.1 - 46.0 %    MCV 89.5 78.0 - 100.0 fL    MCH 28.5 26.0 - 32.0 pg    MCHC 31.9 31.0 - 35.5  g/dL    RDW-CV 83.0 (H) 88.4 - 15.5 %    PLATELETS 40 (L) 150 - 400 x10^3/uL    MPV 11.5 8.7 - 12.5 fL   BASIC METABOLIC PANEL   Result Value Ref Range    SODIUM 145 (H) 133 - 144 mmol/L    POTASSIUM 3.6 3.2 - 5.0 mmol/L    CHLORIDE 107 (H) 96 - 106 mmol/L    CO2 TOTAL 23 22 - 30 mmol/L    ANION GAP 15 7 - 18 mmol/L    CALCIUM 8.3 8.3 - 10.7  mg/dL    GLUCOSE 885 (H) 74 - 109 mg/dL    BUN 54 (H) 8 - 23 mg/dL    CREATININE 8.83 (H) 0.50 - 0.90 mg/dL    BUN/CREA RATIO 47     ESTIMATED GFR 52 (L) >90 mL/min/1.24m^2   MAGNESIUM    Result Value Ref Range    MAGNESIUM  1.9 1.6 - 2.4 mg/dL   PHOSPHORUS   Result Value Ref Range    PHOSPHORUS 2.7 2.5 - 4.5 mg/dL   HEPATIC FUNCTION PANEL   Result Value Ref Range    ALBUMIN 2.8 (L) 3.5 - 5.2 g/dL    ALKALINE PHOSPHATASE 451 (H) 35 - 129 U/L    ALT (SGPT) 502 (H) 0 - 33 U/L    AST (SGOT) 950 (H) 0 - 32 U/L    BILIRUBIN TOTAL 4.7 (H) 0.2 - 1.2 mg/dL    BILIRUBIN DIRECT 3.3 (H) 0.0 - 0.3 mg/dL    PROTEIN TOTAL 5.0 (L) 6.4 - 8.3 g/dL   CROSSMATCH RED CELLS - UNITS , 1 Units   Result Value Ref Range    Coding System [id number]     UNIT NUMBER [id number]     BLOOD COMPONENT TYPE LR RBC, Adsol1, 04710     UNIT DIVISION 00     UNIT DISPENSE STATUS ISSUED     TRANSFUSION STATUS OK TO TRANSFUSE     IS CROSSMATCH Electronically Compatible     Product Code [id number]    TYPE AND SCREEN   Result Value Ref Range    UNITS ORDERED 1     ABO/RH(D) O POSITIVE     ANTIBODY SCREEN NEGATIVE     SPECIMEN EXPIRATION DATE 07/28/2024,2359        IMAGES :  Results for orders placed or performed during the hospital encounter of 07/08/24 (from the past 72 hours)   CT BRAIN WO IV CONTRAST     Status: Abnormal    Narrative    Maymuna JEAN Feger    CT BRAIN WO IV CONTRAST performed on 07/15/2024 8:16 AM.    INDICATION:  Fall   Additional History:  fall, nausea, weakness    TECHNIQUE:  Unenhanced CT head with axial, coronal, and sagittal multiplanar reformations. Dose modulation, automated exposure control, and/or iterative reconstruction were used for dose reduction.    COMPARISON: June 14, 2024    FINDINGS:  There is a small amount of acute blood products in the subdural space overlying the left frontal lobe anteriorly and inferiorly. This measures 6 mm in thickness. A small collection of blood in the subdural space along the high convexity of the  left frontal lobe is also seen. There is very mild mass effect on the left frontal lobe with no significant effacement of the ventricular system, edema or midline shift.    Elsewhere, the brain, ventricles and subarachnoid spaces are within normal limits. There is no evidence of acute large vessel infarct.  No acute blood products are seen elsewhere. The calvarium appears intact.              Impression    Small volume of acute hemorrhage in the subdural space overlying the left frontal lobe anteriorly and superiorly as detailed above. No significant change otherwise seen.  .  .  .  s/d/g    A Critical Red actionable finding has been sent via the [employer] Actionable Findings application on 07/15/2024 8:26 AM, Message ID [id number]. Receipt of this communication will be communicated to [facility] ED RADIOLOGY STAFF or responsible provider and will be documented in [employer] Actionable Findings System upon receiving the acknowledgement.    A Critical Red actionable finding has been sent via the [employer] Actionable Findings application on 07/15/2024 8:26 AM, Message ID [id number]. Receipt of this communication will be communicated to [facility] RADIOLOGY STAFF or responsible provider and will be documented in [employer] Actionable Findings System upon receiving the acknowledgement.      Radiologist location ID: [id number]     CT BRAIN WO IV CONTRAST     Status: None    Narrative    Dijon JEAN Leverette    CT BRAIN WO IV CONTRAST performed on 07/16/2024 6:33 AM.    INDICATION:  Subdural hematoma   Additional History:  Subdural hematoma    TECHNIQUE:  Unenhanced CT head with axial, coronal, and sagittal multiplanar reformations. Dose modulation, automated exposure control, and/or iterative reconstruction were used for dose reduction.    COMPARISON: Previous day    FINDINGS:  Small amount of acute blood in the subdural space overlying the left frontal lobe anteriorly and also superiorly is stable in appearance. No significant  mass effect or midline shift.    No acute blood products are seen elsewhere. No adverse interval change is seen.              Impression    Stable exam  .  .  .  s/d/g        Radiologist location ID: [id number]       Physical Exam:  Physical Exam  Vitals reviewed.   Constitutional:       Appearance: Normal appearance. She is normal weight.   HENT:      Head: Normocephalic.      Nose: Nose normal.      Mouth/Throat:      Pharynx: Oropharynx is clear.   Eyes:      Extraocular Movements: Extraocular movements intact.      Conjunctiva/sclera: Conjunctivae normal.   Cardiovascular:      Rate and Rhythm: Normal rate.      Pulses: Normal pulses.   Pulmonary:      Effort: Pulmonary effort is normal.   Abdominal:      Palpations: Abdomen is soft.   Musculoskeletal:         General: Normal range of motion.      Cervical back: Normal range of motion.   Skin:     General: Skin is dry.      Coloration: Skin is jaundiced.   Neurological:      Mental Status: She is alert. She is disoriented.   Psychiatric:         Cognition and Memory: Cognition is impaired. Memory is impaired.       Problem List:  Active Hospital Problems   (*Primary Problem)    Diagnosis    *Acute hypoxic respiratory failure (CMS HCC)    Subdural hematoma (CMS HCC)  NSVT (nonsustained ventricular tachycardia) (CMS HCC)    Heart failure, unspecified HF chronicity, unspecified heart failure type (CMS HCC)    Acute cholecystitis    Thickening of wall of gallbladder with pericholecystic fluid    Symptomatic anemia    COVID-19    Acute blood loss anemia    Transaminitis    Malignant neoplasm of lung, unspecified laterality, unspecified part of lung (CMS HCC)    Pulmonary HTN (CMS HCC)    Pneumonia due to COVID-19 virus    Mood disorder (CMS HCC)    Anemia, unspecified type    Cigarette smoker    Metastatic cancer to liver    History of breast cancer     Chronic    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)    Paroxysmal atrial fibrillation (CMS  HCC)     Chronic    Hypertension     Chronic    CAD (coronary artery disease)      Assessment/Plan:    68 year old female presenting with acute hypoxic respiratory failure:     Acute hypoxic respiratory failure:  CXR revealing vague pulmonary opacities that could be mild PNA or atelectasis.  She has been COVID positive this admission.  CTA chest without evidence of PE.  She is on p.o. antibiotics.    Small-cell lung CA with liver metastasis:  Follows with Medical Oncology on outpatient basis at [facility].  She was to receive carboplatin/etoposide/Tecentriq on 07/08/2024 when she presented with acute respiratory symptoms that prompted further evaluation by the emergency room as stated in the above HPI.  Withhold current chemotherapy regimen until resolution of acute conditions.    Normocytic hypochromic anemia:   Current H/H = 6.5/20.4.  Receiving 1u PRBC this morning, 07/16/2024.  Has received multiple PRBC transfusions since being admitted.  Could be secondary to acute illness or metastatic disease.  Noted hemoperitoneum per recent CT abdomen/pelvis.  Vitamin B12 normal at 1106.  Folate low at 3.5.  On daily folic acid .  Copper normal at 113.  Noted transaminitis as well as elevated alk-phos.  This is likely related to liver mets.  Continue to monitor counts.  Transfuse with PRBC if hemoglobin less than 7 grams/deciliter or greater than 7 and symptomatic.  Will benefit from blood transfusion.    Thrombocytopenia:  Current counts = 40,000.  She is s/p recent fall, with CT head revealing small subdural hemorrhage and associated mental status change.  Currently in ICU for closer monitoring and management.  Low platelet counts secondary to hepatic dysfunction r/t liver metastasis.  Defer further thrombocytopenia panel at this time.  Continue to monitor counts.  Transfuse with platelets if <50 K and actively bleeding or <10 K w/wo bleeding.  Previously slightly elevated PT/INR.  Normal PTT and fibrinogen.    She  has received FFP transfusion since being admitted, most recently being 07/15/2024.  Has 1 bag FFP on hold - PT/INR to be rechecked this morning.  Give remaining bag of FFP today if INR remains above 1.5.    Subdural hemorrhage: As above, as evidenced by CT head.  Neurosurgery and neurology following.  Keep platelets greater than 100,000. Transfuse FFPs if INR greater than 1.5.    Cholelithiasis: General surgery is following.  She is s/p cholecystectomy  07/11/2024.  Noted hemoperitoneum per recent CT abdomen/pelvis.  General surgery without further surgical intervention at this time - recommending serial H/H checks.    Hypertension:  Antihypertensives held this morning due to noted  hypotension and in light of recent bleed, mentation change.    HLD:  On statin.    CAD:  History of CABG x1.  81 mg ASA held at this time.      Rosina Ice, APRN, CNP      Attending note:  I have independently seen and examined the patient on the same date of service as our mid-level.  I agree with the plan of care as documented by my mid-level.  I have personally reviewed  the pertinent medications, labs  and radiographic studies  as documented in my mid-level note.  The case was discussed in detail with the midlevel provider.  I agree with the above note.  Some changes and additions were made.  I spent more than 50% of the total encounter time for the care of this patient and documentation.      Norleen Amen, MD

## 2024-07-16 NOTE — Care Plan (Signed)
 [facility]   [address]  [place], [address]  (Office) [phone]    Rehabilitation Services    Speech Therapy Inpatient Daily Treatment Note          Patient Name: [name]  Date of Birth: June 11, 1956  Weight:  Weight: 66.6 kg (146 lb 14.4 oz)  Room/Bed: 26/A  Payor: [employer] MEDICARE / Plan: [employer] CHOICE PPO / Product Type: PPO /          07/16/24 1000   Rehab Session   SLP Visit Date 07/16/24   Total SLP Minutes: 10   General Information   Patient Profile Reviewed yes   Referring Physician Merlin Overland, NP   Mutuality/Individual Preferences   Anxieties, Fears or Concerns none reported   Patient-Specific Goals (Include Timeframe) swallow free from aspiration   Cognitive   Level of Consciousness alert   SLP Clinical Impression   Assessment functional swallow       Subjective: Pt agreeable.  Reports no swallowing difficulties.  Pt oriented to person, place and time and able to given personal information.  Pt with neuro checks all night and reported by nursing to have good cognitive skills.      Objective: Pt. Positioned upright and given thin via cup and straw and observed pills.      Assessment:  No cough, choke or wet vocal quality noted with consistencies given suggesting a continued functional swallow.      Plan/Recommendations:   Consider continuation of current diet per recommendations from previous clinical assessment.    Speech will sign off at this time.    Please re-consult speech therapy if further services needed.     Thank you             Speech intervention minutes: SWALLOW TREATMENT 10 MINUTES    Continue to follow patient according to established plan of care. The risks/benefits of therapy have been discussed with the patient/caregiver and he/she is in agreement with the established plan of care.     Thank you,     Almarie Beck, ST  07/16/2024  10:25

## 2024-07-16 NOTE — Consults (Addendum)
 [facility] MEDICINE [facility]  [address]  [place] [address]     Consult  Follow Up Note    [name], [name], 68 y.o. female  Date of Birth:  12-Dec-1955  Encounter Start Date:  07/08/2024  Inpatient Admission Date: 07/08/2024   Date of service: 07/16/24     Requesting MD: No ref. provider found     Reason for consultation: Acute cholecystitis    HPI:  [name] is a 68 y.o. female who was sent to the emergency department from infusion Center where she had presented to start chemotherapy.  She has a recent diagnosis of small cell carcinoma with liver Mets.  She complained of dyspnea and hypoxia and was diagnosed with COVID.  CT abdomen and pelvis with findings of hepatomegaly, hepatic steatosis and hepatic lesions, new pericholecystic fluid.  General surgery consulted for evaluation.    Subjective:  Patient resting in bed, no acute distress.  [name] postop day 5 for robotic cholecystectomy.  Abdomen is soft, mildly distended, expected postop tenderness with hypoactive bowel sounds.  Tolerating regular diet without nausea or vomiting.  Right lower quadrant trocar site with 2 sutures without further active bleeding.  [name] alert and oriented x3.  [name] noted to have scleral icterus with a significant bump in bilirubin.    Historical Data   Past Medical History:   Diagnosis Date    Abdominal pain     Cancer (CMS HCC)     Chest pain     Chronic lung disease     Coronary artery disease     Dyslipidemia     Essential hypertension     Heart disease     History of percutaneous coronary intervention     History of stress test     Mixed hyperlipidemia     Unspecified chronic bronchitis          Past Surgical History:   Procedure Laterality Date    CARDIAC CATHETERIZATION      CORONARY ANGIOPLASTY      CORONARY ARTERY STENT PLACEMENT      HX BACK SURGERY      HX CORONARY ARTERY BYPASS GRAFT      HX MASTECTOMY, SIMPLE Bilateral     HX TUBAL LIGATION      NECK SURGERY            Allergies[1]    Social History  Social History[2]         Current Outpatient Medications   Medication Instructions    anastrozole  (ARIMIDEX ) 1 mg, Daily    aspirin  81 mg, Oral, Daily    atorvastatin  (LIPITOR) 40 mg, Oral, Daily    cholecalciferol  (vitamin D3) 1,000 Units, Daily    clonazePAM  (KLONOPIN ) 0.5 mg, 2 TIMES DAILY PRN    DULoxetine  (CYMBALTA  DR) 60 mg, Daily    fluticasone  propion-salmeteroL (ADVAIR ) 250-50 mcg/dose Inhalation oral diskus inhaler 1 Inhalation, Inhalation, 2 TIMES DAILY    fluticasone  propionate (FLONASE ) 50 mcg/actuation Nasal Spray, Suspension 1 Spray, DAILY PRN    gabapentin  (NEURONTIN ) 400 mg, Oral, 3 TIMES DAILY    losartan  (COZAAR ) 25 mg, Oral, Daily    metoprolol  tartrate (LOPRESSOR ) 25 mg, 2 TIMES DAILY    oxyCODONE -acetaminophen  (PERCOCET) 7.5-325 mg Oral Tablet 1 Tablet, Oral, EVERY 4 HOURS PRN    prochlorperazine  (COMPAZINE ) 10 mg, Oral, 3 TIMES DAILY PRN    promethazine  (PHENERGAN ) 25 mg, Oral, EVERY 8 HOURS PRN    traZODone  (DESYREL ) 150  mg, NIGHTLY        albuterol  (PROVENTIL ) 2.5 mg / 3 mL (0.083%) neb solution, 2.5 mg, Nebulization, 4x/day  aluminum-magnesium  hydroxide-simethicone (MAG-AL PLUS) 200-200-20 mg per 5 mL oral liquid, 30 mL, Oral, Q4H PRN  amiodarone  (CORDARONE ) tablet, 200 mg, Oral, Daily with Breakfast  anastrozole  (ARIMIDEX ) tablet, 1 mg, Oral, Daily  budesonide (PULMICORT RESPULES) 0.5 mg/2 mL nebulizer suspension, 1 mg, Nebulization, 2x/day   And  arformoterol (BROVANA) 15 mcg/2 mL nebulizer solution, 15 mcg, Nebulization, 2x/day  atorvastatin  (LIPITOR) tablet, 40 mg, Oral, Daily  benzonatate (TESSALON) capsule, 100 mg, Oral, Q8H PRN  cefpodoxime (VANTIN) tablet, 400 mg, Oral, 2x/day-Food  cholecalciferol  (VITAMIN D3) 1000 unit (25 mcg) tablet, 1,000 Units, Oral, Daily  clonazePAM  (klonoPIN ) tablet, 0.5 mg, Oral, 2x/day PRN  NS 250 mL flush bag, , Intravenous, Q15 Min PRN   And  D5W 250 mL flush bag, , Intravenous, Q15 Min PRN  dexAMETHasone   (DECADRON ) tablet 6 mg, 6 mg, Oral, Daily  diphenhydrAMINE (BENADRYL) capsule, 25 mg, Oral, HS PRN  DULoxetine  (CYMBALTA ) delayed release capsule, 60 mg, Oral, Daily  [Held by provider] enoxaparin PF (LOVENOX) 40 mg/0.4 mL SubQ injection, 40 mg, Subcutaneous, Q24H  fluticasone  (FLONASE ) 50 mcg per spray nasal spray, 1 Spray, Each Nostril, Daily PRN  folic acid  (FOLVITE ) tablet, 1 mg, Oral, Daily  gabapentin  (NEURONTIN ) capsule, 300 mg, Oral, 3x/day  HYDROcodone -acetaminophen  (NORCO) 5-325 mg per tablet, 1 Tablet, Oral, Q6H PRN  HYDROmorphone  (DILAUDID ) 0.5 mg/0.5 mL injection, 0.5 mg, Intravenous, Q4H PRN  losartan  (COZAAR ) tablet, 25 mg, Oral, Daily  magnesium  hydroxide (MILK OF MAGNESIA) 400mg  per 5mL oral liquid, 15 mL, Oral, 4x/day PRN  metoprolol  tartrate (LOPRESSOR ) tablet, 25 mg, Oral, 2x/day  multivitamin-minerals-iron  oral liquid, 15 mL, Oral, Daily  NS bolus infusion 40 mL, 40 mL, Intravenous, Once PRN  NS bolus infusion 40 mL, 40 mL, Intravenous, Once PRN  NS flush syringe, 3 mL, Intracatheter, Q8HRS  NS flush syringe, 3 mL, Intracatheter, Q1H PRN  OLANZapine  (zyPREXA ) tablet, 5 mg, Oral, NIGHTLY  omega-3 fatty acids (LOVAZA ) capsule, 1 g, Oral, Daily  ondansetron  (ZOFRAN ) 2 mg/mL injection, 4 mg, Intravenous, Q6H PRN  pantoprazole  (PROTONIX ) delayed release tablet, 40 mg, Oral, Daily Before Lunch  polyethylene glycol (MIRALAX) oral packet, 17 g, Oral, Daily  prochlorperazine  (COMPAZINE ) tablet, 10 mg, Oral, 3x/day PRN  traZODone  (DESYREL ) tablet, 150 mg, Oral, NIGHTLY        Review of Systems:   All pertinent review of systems as addressed and detailed in HPI above    Vital Signs:  Patient Vitals for the past 24 hrs:   BP Temp Pulse Resp SpO2   07/16/24 1030 119/70 36.8 C (98.3 F) 84 (!) 25 90 %   07/16/24 1015 128/70 -- 86 (!) 22 91 %   07/16/24 1000 118/65 -- 85 (!) 21 93 %   07/16/24 0945 110/80 -- 79 (!) 24 91 %   07/16/24 0930 117/66 -- 82 (!) 22 91 %   07/16/24 0900 (!) 115/55 -- 82 (!) 23 92 %    07/16/24 0845 117/61 -- 82 (!) 24 95 %   07/16/24 0830 117/77 -- 81 20 94 %   07/16/24 0815 114/67 -- 82 20 97 %   07/16/24 0800 108/60 -- 76 16 97 %   07/16/24 0745 (!) 79/68 37.1 C (98.8 F) 82 (!) 21 97 %   07/16/24 0730 119/70 -- 80 (!) 21 97 %   07/16/24 0719 119/73 36.5 C (97.7 F) 81  15 99 %   07/16/24 0715 119/73 -- 81 19 98 %   07/16/24 0700 93/79 36.5 C (97.7 F) 78 18 98 %   07/16/24 0600 100/62 -- 79 (!) 22 91 %   07/16/24 0500 115/71 -- 79 (!) 21 94 %   07/16/24 0400 113/69 -- 79 18 93 %   07/16/24 0300 103/62 36.4 C (97.5 F) 77 19 95 %   07/16/24 0100 105/60 -- 81 19 95 %   07/16/24 0000 112/68 -- 81 20 94 %   07/15/24 2300 115/67 36.6 C (97.9 F) 84 (!) 22 94 %   07/15/24 2124 124/71 -- 93 19 92 %   07/15/24 2100 125/67 -- 91 19 92 %   07/15/24 2000 131/78 -- 95 17 91 %   07/15/24 1900 125/67 36.6 C (97.9 F) 88 (!) 23 92 %   07/15/24 1845 -- -- 92 (!) 22 90 %   07/15/24 1831 -- 36.9 C (98.5 F) 88 (!) 23 91 %   07/15/24 1830 115/72 -- 90 (!) 21 90 %   07/15/24 1800 120/76 -- 89 (!) 22 92 %   07/15/24 1730 117/72 -- 87 (!) 24 91 %   07/15/24 1700 119/62 -- 87 (!) 23 90 %   07/15/24 1630 119/65 -- 85 20 92 %   07/15/24 1600 109/72 -- 86 17 100 %   07/15/24 1554 -- -- -- -- 94 %   07/15/24 1530 105/61 36.9 C (98.4 F) 85 18 93 %   07/15/24 1500 116/62 -- 83 (!) 23 91 %   07/15/24 1445 (!) 119/59 -- 81 18 90 %   07/15/24 1430 (!) 103/54 36.8 C (98.3 F) 84 (!) 21 93 %   07/15/24 1415 (!) 100/55 -- 83 20 93 %   07/15/24 1400 (!) 102/58 36.9 C (98.4 F) 84 (!) 22 92 %   07/15/24 1345 (!) 102/54 -- 84 (!) 23 92 %   07/15/24 1330 (!) 103/54 36.8 C (98.3 F) 83 (!) 22 92 %   07/15/24 1315 (!) 99/53 -- 83 (!) 22 93 %   07/15/24 1300 (!) 106/55 -- 83 (!) 21 93 %   07/15/24 1245 (!) 99/59 36.8 C (98.2 F) 86 19 93 %   07/15/24 1230 102/61 -- 84 (!) 22 94 %   07/15/24 1215 (!) 96/58 -- 87 (!) 22 93 %   07/15/24 1200 (!) 103/59 36.9 C (98.4 F) 87 (!) 23 94 %   07/15/24 1145 (!) 102/54 -- 83 (!) 23  93 %   07/15/24 1130 102/60 -- 85 (!) 24 93 %   07/15/24 1115 (!) 100/58 -- 86 (!) 23 93 %   07/15/24 1100 103/62 -- 87 (!) 22 93 %   07/15/24 1045 100/63 -- 87 20 92 %        Physical Exam:  Physical Exam  Constitutional:       General: She is not in acute distress.  Eyes:      General: Scleral icterus present.   Cardiovascular:      Rate and Rhythm: Normal rate.   Pulmonary:      Comments: Reports some baseline shortness of breath  Abdominal:      Comments: Abdomen is soft, mildly distended, expected postop tenderness with hypoactive bowel sounds.    Skin:     General: Skin is warm and dry.      Comments: No further bleeding noted from right lower quadrant trocar site,  sutures clean dry and intact   Neurological:      Mental Status: She is alert and oriented to person, place, and time.          Studies:  I have reviewed all available studies within the electronic medical record.    Results for orders placed or performed during the hospital encounter of 07/08/24 (from the past 24 hours)   PRODUCT: FFP/PLASMA - UNITS : 2 Units   Result Value Ref Range    COMMUNICATION LOG       NOTIFIED MORGAN/ICU THAT TP IS READY FOR PT. 1817 07/15/24.CS    Coding System [id number]     UNIT NUMBER [id number]     BLOOD COMPONENT TYPE THAWED PLASMA     UNIT DIVISION 00     UNIT DISPENSE STATUS ISSUED     TRANSFUSION STATUS OK TO TRANSFUSE     Product Code E2121V00     Coding System ISBT128     UNIT NUMBER [id number]     BLOOD COMPONENT TYPE THAWED PLASMA     UNIT DIVISION 00     UNIT DISPENSE STATUS ISSUED,FINAL     TRANSFUSION STATUS OK TO TRANSFUSE     Product Code Z4451C99    CBC   Result Value Ref Range    WBC 8.2 3.7 - 11.0 x10^3/uL    RBC 2.28 (L) 3.85 - 5.22 x10^6/uL    HGB 6.5 (LL) 11.5 - 16.0 g/dL    HCT 79.5 (L) 65.1 - 46.0 %    MCV 89.5 78.0 - 100.0 fL    MCH 28.5 26.0 - 32.0 pg    MCHC 31.9 31.0 - 35.5 g/dL    RDW-CV 83.0 (H) 88.4 - 15.5 %    PLATELETS 40 (L) 150 - 400 x10^3/uL    MPV 11.5 8.7 - 12.5 fL   BASIC  METABOLIC PANEL   Result Value Ref Range    SODIUM 145 (H) 133 - 144 mmol/L    POTASSIUM 3.6 3.2 - 5.0 mmol/L    CHLORIDE 107 (H) 96 - 106 mmol/L    CO2 TOTAL 23 22 - 30 mmol/L    ANION GAP 15 7 - 18 mmol/L    CALCIUM 8.3 8.3 - 10.7 mg/dL    GLUCOSE 885 (H) 74 - 109 mg/dL    BUN 54 (H) 8 - 23 mg/dL    CREATININE 8.83 (H) 0.50 - 0.90 mg/dL    BUN/CREA RATIO 47     ESTIMATED GFR 52 (L) >90 mL/min/1.39m^2   MAGNESIUM    Result Value Ref Range    MAGNESIUM  1.9 1.6 - 2.4 mg/dL   PHOSPHORUS   Result Value Ref Range    PHOSPHORUS 2.7 2.5 - 4.5 mg/dL   HEPATIC FUNCTION PANEL   Result Value Ref Range    ALBUMIN 2.8 (L) 3.5 - 5.2 g/dL    ALKALINE PHOSPHATASE 451 (H) 35 - 129 U/L    ALT (SGPT) 502 (H) 0 - 33 U/L    AST (SGOT) 950 (H) 0 - 32 U/L    BILIRUBIN TOTAL 4.7 (H) 0.2 - 1.2 mg/dL    BILIRUBIN DIRECT 3.3 (H) 0.0 - 0.3 mg/dL    PROTEIN TOTAL 5.0 (L) 6.4 - 8.3 g/dL   CROSSMATCH RED CELLS - UNITS , 1 Units   Result Value Ref Range    Coding System [id number]     UNIT NUMBER [id number]     BLOOD COMPONENT TYPE LR RBC, Adsol1, 04710     UNIT DIVISION  00     UNIT DISPENSE STATUS ISSUED     TRANSFUSION STATUS OK TO TRANSFUSE     IS CROSSMATCH Electronically Compatible     Product Code [id number]    TYPE AND SCREEN   Result Value Ref Range    UNITS ORDERED 1     ABO/RH(D) O POSITIVE     ANTIBODY SCREEN NEGATIVE     SPECIMEN EXPIRATION DATE 07/10/2024,2359    PT/INR   Result Value Ref Range    PROTHROMBIN TIME 19.2 (H) 12.1 - 15.3 seconds    INR 1.59 (H) 0.86 - 1.14   PRODUCT: PLATELETS - UNITS , 1 Units   Result Value Ref Range    Coding System ISBT128     UNIT NUMBER [id number]     BLOOD COMPONENT TYPE PATHOGEN REDUCED PLASMA REDUCED1     UNIT DIVISION 00     UNIT DISPENSE STATUS ALLOCATED     TRANSFUSION STATUS OK TO TRANSFUSE     Product Code [id number]      CT BRAIN WO IV CONTRAST  Result Date: 07/15/2024  Impression Small volume of acute hemorrhage in the subdural space overlying the left frontal lobe anteriorly and superiorly  as detailed above. No significant change otherwise seen. . . . s/d/g A Critical Red actionable finding has been sent via the [employer] Actionable Findings application on 07/15/2024 8:26 AM, Message ID [id number]. Receipt of this communication will be communicated to [facility] RADIOLOGY STAFF or responsible provider and will be documented in [employer] Actionable Findings System upon receiving the acknowledgement. A Critical Red actionable finding has been sent via the [employer] Actionable Findings application on 07/15/2024 8:26 AM, Message ID [id number]. Receipt of this communication will be communicated to [facility] RADIOLOGY STAFF or responsible provider and will be documented in [employer] Actionable Findings System upon receiving the acknowledgement. Radiologist location ID: [id number]          Assessment & Plan:  Active Hospital Problems    Diagnosis    Primary Problem: Acute hypoxic respiratory failure (CMS HCC)    Subdural hematoma (CMS HCC)    NSVT (nonsustained ventricular tachycardia) (CMS HCC)    Heart failure, unspecified HF chronicity, unspecified heart failure type (CMS HCC)    Acute cholecystitis    Thickening of wall of gallbladder with pericholecystic fluid    Symptomatic anemia    COVID-19    Acute blood loss anemia    Transaminitis    Malignant neoplasm of lung, unspecified laterality, unspecified part of lung (CMS HCC)    Pulmonary HTN (CMS HCC)    Pneumonia due to COVID-19 virus    Mood disorder (CMS HCC)    Anemia, unspecified type    Cigarette smoker    Metastatic cancer to liver    History of breast cancer    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  Continue diet as tolerated.  No further bleeding from trocar site. Continue ICU management per critical care team for subdural hematoma status post fall this a.m..  We will  obtain CT abdomen and pelvis with IV contrast to evaluate increase in bilirubin.    Rachel LITTIE Pizza, APRN, CNP    Attending attestation:  I saw and evaluated the patient with my midlevel in the ICU. I performed a  substantial portion of the patients care.  I personally performed the exam and the medical decision making for the encounter today.  DOS: 07/16/24    Patient awake and alert for us  with a GCS of 15 this morning.  No further bleeding noted from the trocar site.  [name] still has some element of acute blood loss anemia.  She is currently receiving 1 packing it is of blood cells.    [name] grossly jaundice now with the elevated bilirubin on hepatic function panel.  There is a potential of a possible bile leak versus retained stone.  We will get HIDA scan to further evaluate.      This chart was partially dictated by [employer], a voice recognition software.  It may contain errors or words that were not intended to be used.  Please contact the provider for errors or clarifications.    /Cordae Mccarey JINNY Raider MD  07/16/24 10:55   Department of Surgery                       [1] No Known Allergies  [2]   Social History  Tobacco Use    Smoking status: Every Day     Current packs/day: 0.50     Average packs/day: 0.5 packs/day for 52.8 years (26.4 ttl pk-yrs)     Types: Cigarettes     Start date: 1973    Smokeless tobacco: Never   Vaping Use    Vaping status: Never Used   Substance Use Topics    Alcohol use: Yes     Comment: occasional    Drug use: Yes     Types: Marijuana

## 2024-07-16 NOTE — Progress Notes (Signed)
[facility]    Cardiology Inpatient Progress Note    [name], [name], 68 y.o. female  Encounter Start Date:  07/08/2024  Inpatient Admission Date: 07/08/2024   Date of Service: 07/16/24  Date of Birth:  05/23/1956  PCP:  Garvin Lemmings, FNP    Subjective:  Patient resting in bed.  Poor appetite this morning with some mild discomfort in the right upper quadrant.  No vomiting.  No shortness of breath or chest pain.  No headache, vision changes.        Exam:  Temperature: 36.8 C (98.3 F)  Heart Rate: 84  BP (Non-Invasive): 119/70  Respiratory Rate: (!) 25  SpO2: 90 %    Intake/Output Summary (Last 24 hours) at 07/16/2024 1053  Last data filed at 07/16/2024 0930  Gross per 24 hour   Intake 1135 ml   Output --   Net 1135 ml      General/Constitutional: no acute distress  Heart/Cardiovascular:   Regular rate and rhythm, S1,S2 are normal  Respiratory/Lungs:  Clear to auscultation  Extremities:  No lower extremity edema   Neurological: alert and oriented x3    Labs:    Results for orders placed or performed during the hospital encounter of 07/08/24 (from the past 24 hours)   PRODUCT: FFP/PLASMA - UNITS : 2 Units    Collection Time: 07/15/24  6:00 PM   Result Value Ref Range    COMMUNICATION LOG       NOTIFIED MORGAN/ICU THAT TP IS READY FOR PT. 8182 07/15/24.CS    Coding System [id number]     UNIT NUMBER [id number]     BLOOD COMPONENT TYPE THAWED PLASMA     UNIT DIVISION 00     UNIT DISPENSE STATUS ISSUED     TRANSFUSION STATUS OK TO TRANSFUSE     Product Code [id number]     Coding System ISBT128     UNIT NUMBER [id number]     BLOOD COMPONENT TYPE THAWED PLASMA     UNIT DIVISION 00     UNIT DISPENSE STATUS ISSUED,FINAL     TRANSFUSION STATUS OK TO TRANSFUSE     Product Code [id number]    CBC    Collection Time: 07/16/24  4:45 AM   Result Value Ref Range    WBC 8.2 3.7 - 11.0 x10^3/uL    RBC 2.28 (L) 3.85 - 5.22 x10^6/uL    HGB 6.5 (LL) 11.5 - 16.0 g/dL    HCT 79.5 (L) 65.1 - 46.0  %    MCV 89.5 78.0 - 100.0 fL    MCH 28.5 26.0 - 32.0 pg    MCHC 31.9 31.0 - 35.5 g/dL    RDW-CV 83.0 (H) 88.4 - 15.5 %    PLATELETS 40 (L) 150 - 400 x10^3/uL    MPV 11.5 8.7 - 12.5 fL   BASIC METABOLIC PANEL    Collection Time: 07/16/24  4:45 AM   Result Value Ref Range    SODIUM 145 (H) 133 - 144 mmol/L    POTASSIUM 3.6 3.2 - 5.0 mmol/L    CHLORIDE 107 (H) 96 - 106 mmol/L    CO2 TOTAL 23 22 - 30 mmol/L    ANION GAP 15 7 - 18 mmol/L    CALCIUM 8.3 8.3 - 10.7 mg/dL    GLUCOSE 885 (H) 74 - 109 mg/dL    BUN 54 (H) 8 - 23 mg/dL    CREATININE 8.83 (H) 0.50 - 0.90 mg/dL    BUN/CREA RATIO  47     ESTIMATED GFR 52 (L) >90 mL/min/1.98m^2   MAGNESIUM     Collection Time: 07/16/24  4:45 AM   Result Value Ref Range    MAGNESIUM  1.9 1.6 - 2.4 mg/dL   PHOSPHORUS    Collection Time: 07/16/24  4:45 AM   Result Value Ref Range    PHOSPHORUS 2.7 2.5 - 4.5 mg/dL   HEPATIC FUNCTION PANEL    Collection Time: 07/16/24  4:45 AM   Result Value Ref Range    ALBUMIN 2.8 (L) 3.5 - 5.2 g/dL    ALKALINE PHOSPHATASE 451 (H) 35 - 129 U/L    ALT (SGPT) 502 (H) 0 - 33 U/L    AST (SGOT) 950 (H) 0 - 32 U/L    BILIRUBIN TOTAL 4.7 (H) 0.2 - 1.2 mg/dL    BILIRUBIN DIRECT 3.3 (H) 0.0 - 0.3 mg/dL    PROTEIN TOTAL 5.0 (L) 6.4 - 8.3 g/dL   CROSSMATCH RED CELLS - UNITS , 1 Units    Collection Time: 07/16/24  5:45 AM   Result Value Ref Range    Coding System [id number]     UNIT NUMBER [id number]     BLOOD COMPONENT TYPE LR RBC, Adsol1, 04710     UNIT DIVISION 00     UNIT DISPENSE STATUS ISSUED     TRANSFUSION STATUS OK TO TRANSFUSE     IS CROSSMATCH Electronically Compatible     Product Code [id number]    TYPE AND SCREEN    Collection Time: 07/16/24  5:45 AM   Result Value Ref Range    UNITS ORDERED 1     ABO/RH(D) O POSITIVE     ANTIBODY SCREEN NEGATIVE     SPECIMEN EXPIRATION DATE 07/23/2024,2359    PT/INR    Collection Time: 07/16/24  9:17 AM   Result Value Ref Range    PROTHROMBIN TIME 19.2 (H) 12.1 - 15.3 seconds    INR 1.59 (H) 0.86 - 1.14    Narrative     In the setting of warfarin therapy, a moderate-intensity INR goal range is 2.0 to 3.0 and a high-intensity INR goal range is 2.5 to 3.5.    INR is ONLY validated to determine the level of anticoagulation with vitamin K antagonists (warfarin). Other factors may elevate the INR including but not limited to direct oral anticoagulants (DOACs), liver dysfunction, vitamin K deficiency, DIC, factor deficiencies, and factor inhibitors.   PRODUCT: PLATELETS - UNITS , 1 Units    Collection Time: 07/16/24  9:45 AM   Result Value Ref Range    Coding System ISBT128     UNIT NUMBER [id number]     BLOOD COMPONENT TYPE PATHOGEN REDUCED PLASMA REDUCED1     UNIT DIVISION 00     UNIT DISPENSE STATUS ALLOCATED     TRANSFUSION STATUS OK TO TRANSFUSE     Product Code [id number]             Imaging Studies:     Last Echo  Results for orders placed during the hospital encounter of 07/08/24    TRANSTHORACIC ECHOCARDIOGRAM - ADULT 07/09/2024  8:05 AM    Narrative  **See full report in linked PDF document**  [facility]  [address] [place], [place], [place] [address]    Transthoracic Echocardiographic Report    ______________________________________________________________________________  Name: [name]  MRN: [id number]                Weight: 137 lb  Study Date: 07/09/2024 07:48 AM                      DOB: 1956-06-20             Height: 60.5 in  Gender: Female                                       Age: 60 yrs                 BSA: 1.6 m2  Accession #: [id number]                           BP: 154/90 mmHg  Patient Location: [facility]CHOCARDIOGRAM [facility]  Ordering Provider: CHERI ESTELL SAUNDERS  Tech: Sonny Fell    ______________________________________________________________________________  Procedure:  Transthoracic complete echo with contrast, 2D, spectral and tissue Doppler, color flow Doppler, M-mode.    Quality:  The study images were of technically adequate  quality.    Indications: Pulmonary HTN (CMS HCC),Heart failure    Conclusions:  The left ventricular ejection fraction by visual assessment is estimated to be 55-60%.    Findings  Left Ventricle:   Normal wall thickness. Normal left ventricular size. The left ventricular ejection fraction by visual assessment is estimated to be 55-60%. Left ventricular systolic function is  normal. No segmental/regional wall motion abnormalities identified. Grade I Diastolic dysfunction present with normal left atrial pressure.  Right Ventricle:   Normal right ventricular size. Right ventricular systolic function cannot be assessed. Right ventricular systolic pressure is normal.  Left Atrium:   The left atrium is normal in size.  Right Atrium:   The right atrium is of normal size.  Mitral Valve:   The mitral valve is normal. No evidence of mitral stenosis. There is mild mitral regurgitation.  Tricuspid Valve:   The tricuspid valve is normal. There is mild tricuspid regurgitation. There is no evidence of tricuspid stenosis.  Aortic Valve:   The aortic valve is normal. No Aortic valve stenosis. No significant aortic regurgitation present.  Pulmonic Valve:   There is mild pulmonic regurgitation. There is no evidence of pulmonic valve stenosis.  IVC/Hepatic Veins:   Normal IVC size with >50% inspiratory collapse (estimated RA pressure 3 mmHg).  Aorta:   The aortic root is of normal size.  Pericardium/Pleural space:   Normal pericardium with no pericardial effusion.    Electronically signed by: MD Jayson Rives on 07/09/2024 11:30 AM         STRESS TEST/MYOCARDIAL PERFUSION IMAGING  No results found for this or any previous visit.      CT BRAIN WO IV CONTRAST  Result Date: 07/16/2024  Impression Stable exam . . . s/d/g Radiologist location ID: [id number]     CT BRAIN WO IV CONTRAST  Result Date: 07/15/2024  Impression Small volume of acute hemorrhage in the subdural space overlying the left frontal lobe anteriorly and superiorly as  detailed above. No significant change otherwise seen. . . . s/d/g A Critical Red actionable finding has been sent via the [employer] Actionable Findings application on 07/15/2024 8:26 AM, Message ID [id number]. Receipt of this communication will be communicated to [facility] ED RADIOLOGY STAFF or responsible provider and will be documented  in [employer] Actionable Findings System upon receiving the acknowledgement. A Critical Red actionable finding has been sent via the [employer] Actionable Findings application on 07/15/2024 8:26 AM, Message ID [id number]. Receipt of this communication will be communicated to [facility] RADIOLOGY STAFF or responsible provider and will be documented in [employer] Actionable Findings System upon receiving the acknowledgement. Radiologist location ID: [id number]     CT ABDOMEN PELVIS WO IV CONTRAST  Result Date: 07/13/2024  Impression 1. Grossly stable hematoma in the gallbladder fossa with slight decrease in volume of hemoperitoneum. 2. Additional stable findings as above. Radiologist location ID: [id number]     CT ABDOMEN PELVIS WO IV CONTRAST  Result Date: 07/12/2024  Impression 1. Hematoma associated with the gallbladder fossa with fluid tracking inferiorly. 2. Moderate volume hemoperitoneum, more so blood lying in the dependent pelvis, though also around the liver and spleen and pericolic gutters. 3. Other findings as described above. Radiologist location ID: [id number]     MRI MRCP WO CONTRAST  Result Date: 07/10/2024  Impression * Mild extra hepatic bile duct dilatation as noted on CT 07/08/2024, no bile duct stone. * No gallstones, there is pericholecystic fluid as well as fluid adjacent to the liver. * Diffuse heterogeneous appearance to the liver as described above, this could certainly represent findings associated with acute hepatitis, differential considerations include regenerative nodularity in chronic liver disease, metastatic disease/lymphoma, and possibly sarcoid  Radiologist location ID: [id number] Radiologist location ID: [id number]     US  RT UPPER QUADRANT  Result Date: 07/09/2024  Impression Gallbladder wall thickening with pericholecystic fluid consistent with acute cholecystitis. Dilated common bile duct without distal obstructing lesion identified. Hepatomegaly with hepatic steatosis. Trace ascites. Radiologist location ID: [id number]     CT ANGIO CHEST FOR PULMONARY EMBOLUS W IV CONTRAST  Result Date: 07/08/2024  Impression 1. No evidence of pulmonary embolism. 2. Marginal increase in the size of the right infrahilar mass, right hilar and mediastinal lymphadenopathy compared to the prior study. 3. Other chronic findings as described above. Radiologist location ID: [id number]     CT ABDOMEN PELVIS W IV CONTRAST  Result Date: 07/08/2024  Impression 1. Hepatomegaly, hepatic steatosis and hepatic lesions, not greatly changed and new pericholecystic fluid now present. 2. Multiple nonobstructing bilateral renal calculi. 3. New free fluid in the pelvis. Radiologist location ID: [id number]     XR AP MOBILE CHEST  Result Date: 07/08/2024  Impression 1. Right hilar adenopathy. 2. Vague pulmonary opacities could be mild pneumonia or atelectasis. Radiologist location ID: [id number]     XR AP MOBILE CHEST  Result Date: 06/17/2024  Impression 1. No pneumothorax post bronchoscopy. 2. Right hilar mass. 3. Opacity right lung apex and right lung base, may be aspiration or atelectasis. Radiologist location ID: [id number]          Inpatient Meds:  albuterol  (PROVENTIL ) 2.5 mg / 3 mL (0.083%) neb solution, 2.5 mg, Nebulization, 4x/day  aluminum-magnesium  hydroxide-simethicone (MAG-AL PLUS) 200-200-20 mg per 5 mL oral liquid, 30 mL, Oral, Q4H PRN  amiodarone  (CORDARONE ) tablet, 200 mg, Oral, Daily with Breakfast  anastrozole  (ARIMIDEX ) tablet, 1 mg, Oral, Daily  budesonide (PULMICORT RESPULES) 0.5 mg/2 mL nebulizer suspension, 1 mg, Nebulization, 2x/day   And  arformoterol  (BROVANA) 15 mcg/2 mL nebulizer solution, 15 mcg, Nebulization, 2x/day  atorvastatin  (LIPITOR) tablet, 40 mg, Oral, Daily  benzonatate (TESSALON) capsule, 100 mg, Oral, Q8H PRN  cefpodoxime (VANTIN) tablet, 400 mg, Oral, 2x/day-Food  cholecalciferol  (VITAMIN D3) 1000 unit (25 mcg) tablet, 1,000 Units, Oral, Daily  clonazePAM  (  klonoPIN ) tablet, 0.5 mg, Oral, 2x/day PRN  NS 250 mL flush bag, , Intravenous, Q15 Min PRN   And  D5W 250 mL flush bag, , Intravenous, Q15 Min PRN  dexAMETHasone  (DECADRON ) tablet 6 mg, 6 mg, Oral, Daily  diphenhydrAMINE (BENADRYL) capsule, 25 mg, Oral, HS PRN  DULoxetine  (CYMBALTA ) delayed release capsule, 60 mg, Oral, Daily  [Held by provider] enoxaparin PF (LOVENOX) 40 mg/0.4 mL SubQ injection, 40 mg, Subcutaneous, Q24H  fluticasone  (FLONASE ) 50 mcg per spray nasal spray, 1 Spray, Each Nostril, Daily PRN  folic acid  (FOLVITE ) tablet, 1 mg, Oral, Daily  gabapentin  (NEURONTIN ) capsule, 300 mg, Oral, 3x/day  HYDROcodone -acetaminophen  (NORCO) 5-325 mg per tablet, 1 Tablet, Oral, Q6H PRN  HYDROmorphone  (DILAUDID ) 0.5 mg/0.5 mL injection, 0.5 mg, Intravenous, Q4H PRN  losartan  (COZAAR ) tablet, 25 mg, Oral, Daily  magnesium  hydroxide (MILK OF MAGNESIA) 400mg  per 5mL oral liquid, 15 mL, Oral, 4x/day PRN  metoprolol  tartrate (LOPRESSOR ) tablet, 25 mg, Oral, 2x/day  multivitamin-minerals-iron  oral liquid, 15 mL, Oral, Daily  NS bolus infusion 40 mL, 40 mL, Intravenous, Once PRN  NS bolus infusion 40 mL, 40 mL, Intravenous, Once PRN  NS flush syringe, 3 mL, Intracatheter, Q8HRS  NS flush syringe, 3 mL, Intracatheter, Q1H PRN  OLANZapine  (zyPREXA ) tablet, 5 mg, Oral, NIGHTLY  omega-3 fatty acids (LOVAZA ) capsule, 1 g, Oral, Daily  ondansetron  (ZOFRAN ) 2 mg/mL injection, 4 mg, Intravenous, Q6H PRN  pantoprazole  (PROTONIX ) delayed release tablet, 40 mg, Oral, Daily Before Lunch  polyethylene glycol (MIRALAX) oral packet, 17 g, Oral, Daily  prochlorperazine  (COMPAZINE ) tablet, 10 mg, Oral, 3x/day  PRN  traZODone  (DESYREL ) tablet, 150 mg, Oral, NIGHTLY       Allergies[1]    Telemetry:  Sinus rhythm    Assessment/Plan:  Active Hospital Problems    Diagnosis    Primary Problem: Acute hypoxic respiratory failure (CMS HCC)    Subdural hematoma (CMS HCC)    NSVT (nonsustained ventricular tachycardia) (CMS HCC)    Heart failure, unspecified HF chronicity, unspecified heart failure type (CMS HCC)    Acute cholecystitis    Thickening of wall of gallbladder with pericholecystic fluid    Symptomatic anemia    COVID-19    Acute blood loss anemia    Transaminitis    Malignant neoplasm of lung, unspecified laterality, unspecified part of lung (CMS HCC)    Pulmonary HTN (CMS HCC)    Pneumonia due to COVID-19 virus    Mood disorder (CMS HCC)    Anemia, unspecified type    Cigarette smoker    Metastatic cancer to liver    History of breast cancer    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  Bilirubin elevated with scleral icterus this morning.  CT of the abdomen and pelvis ordered by General surgery.  For now will hold amiodarone  due to risk of liver toxicity.  [name] poor candidate for long-term amiodarone  therapy because of liver metastasis.  Continue low-dose beta-blocker.  Alternate antiarrhythmic therapy could be considered if patient has recurrence of arrhythmias.      No anticoagulation secondary to severe anemia and thrombocytopenia.      Emrys Mckamie, PA-C            [1] No Known Allergies

## 2024-07-16 NOTE — Assessment & Plan Note (Signed)
 Likely due to COVID and possible superimposed bacterial pneumonia  Currently on 2 L nasal cannula  Respiratory panel positive for COVID  CT angiogram did not show any evidence pulmonary embolism although marginal increase in the size of infrahilar mass, right hilar and mediastinal lymphadenopathy.  Continue p.o. Decadron  -will discontinue tomorrow, will complete 10 days  S/p IV Rocephin  Discontinue p.o. cefpodoxime-completed course  Continue Brovana and Pulmicort   Pulmonary is following, appreciate input

## 2024-07-16 NOTE — Assessment & Plan Note (Signed)
 Known history of small-cell carcinoma, planned for outpatient chemotherapy with carboplatin/etoposide/tecenteriq  Oncology is following, recommended to withhold current chemotherapy regimen until acute illness resolves

## 2024-07-16 NOTE — Assessment & Plan Note (Signed)
 Worsening metastatic liver disease likely causing elevated transaminases

## 2024-07-16 NOTE — Progress Notes (Signed)
 [facility]   Progress Note    [name]  Date of service: 07/16/2024  Date of Admission:  07/08/2024  Hospital Day:  LOS: 8 days     Subjective:  Patient was seen this evening while resting in bed with family at bedside.  She reports she is feeling better.  No significant neurologic deficits of developed since her last visit.  She is pending transfer out of the ICU.    Review of Systems:    Review of Systems  Per HPI.      Objective:      Vital Signs:  Vitals:    07/16/24 2100 07/16/24 2112 07/16/24 2140 07/16/24 2200   BP: 117/69 102/88  (!) 85/54   Pulse: 82 84  80   Resp: (!) 25  (!) 23 19   Temp:       SpO2: 100%   98%   Weight:       Height:       BMI:                I/O:  I/O last 24 hours:    Intake/Output Summary (Last 24 hours) at 07/16/2024 2301  Last data filed at 07/16/2024 1900  Gross per 24 hour   Intake 1337.27 ml   Output 450 ml   Net 887.27 ml         albuterol  (PROVENTIL ) 2.5 mg / 3 mL (0.083%) neb solution, 2.5 mg, Nebulization, 4x/day  aluminum-magnesium  hydroxide-simethicone (MAG-AL PLUS) 200-200-20 mg per 5 mL oral liquid, 30 mL, Oral, Q4H PRN  anastrozole  (ARIMIDEX ) tablet, 1 mg, Oral, Daily  budesonide (PULMICORT RESPULES) 0.5 mg/2 mL nebulizer suspension, 1 mg, Nebulization, 2x/day   And  arformoterol (BROVANA) 15 mcg/2 mL nebulizer solution, 15 mcg, Nebulization, 2x/day  atorvastatin  (LIPITOR) tablet, 40 mg, Oral, Daily  benzonatate (TESSALON) capsule, 100 mg, Oral, Q8H PRN  cholecalciferol  (VITAMIN D3) 1000 unit (25 mcg) tablet, 1,000 Units, Oral, Daily  clonazePAM  (klonoPIN ) tablet, 0.5 mg, Oral, 2x/day PRN  NS 250 mL flush bag, , Intravenous, Q15 Min PRN   And  D5W 250 mL flush bag, , Intravenous, Q15 Min PRN  dexAMETHasone  (DECADRON ) tablet 6 mg, 6 mg, Oral, Daily  diphenhydrAMINE (BENADRYL) capsule, 25 mg, Oral, HS PRN  DULoxetine  (CYMBALTA ) delayed release capsule, 60 mg, Oral, Daily  [Held by provider] enoxaparin PF (LOVENOX) 40 mg/0.4 mL SubQ injection, 40  mg, Subcutaneous, Q24H  fluticasone  (FLONASE ) 50 mcg per spray nasal spray, 1 Spray, Each Nostril, Daily PRN  folic acid  (FOLVITE ) tablet, 1 mg, Oral, Daily  gabapentin  (NEURONTIN ) capsule, 300 mg, Oral, 3x/day  HYDROcodone -acetaminophen  (NORCO) 5-325 mg per tablet, 1 Tablet, Oral, Q6H PRN  HYDROmorphone  (DILAUDID ) 0.5 mg/0.5 mL injection, 0.5 mg, Intravenous, Q4H PRN  [Held by provider] losartan  (COZAAR ) tablet, 25 mg, Oral, Daily  magnesium  hydroxide (MILK OF MAGNESIA) 400mg  per 5mL oral liquid, 15 mL, Oral, 4x/day PRN  metoprolol  tartrate (LOPRESSOR ) tablet, 25 mg, Oral, 2x/day  multivitamin-minerals-iron  oral liquid, 15 mL, Oral, Daily  NS flush syringe, 3 mL, Intracatheter, Q8HRS  NS flush syringe, 3 mL, Intracatheter, Q1H PRN  OLANZapine  (zyPREXA ) tablet, 5 mg, Oral, NIGHTLY  omega-3 fatty acids (LOVAZA ) capsule, 1 g, Oral, Daily  ondansetron  (ZOFRAN ) 2 mg/mL injection, 4 mg, Intravenous, Q6H PRN  pantoprazole  (PROTONIX ) delayed release tablet, 40 mg, Oral, Daily Before Lunch  polyethylene glycol (MIRALAX) oral packet, 17 g, Oral, Daily  prochlorperazine  (COMPAZINE ) tablet, 10 mg, Oral, 3x/day PRN  traZODone  (DESYREL ) tablet, 150  mg, Oral, NIGHTLY        Physical Exam:    Physical Exam  Awake, alert, oriented x3.  GCS 15.  PERRL.  Moves all extremities in an antigravity position.  Sensation intact.    Labs:  Results for orders placed or performed during the hospital encounter of 07/08/24 (from the past 24 hours)   CBC    Collection Time: 07/16/24  4:45 AM   Result Value Ref Range    WBC 8.2 3.7 - 11.0 x10^3/uL    RBC 2.28 (L) 3.85 - 5.22 x10^6/uL    HGB 6.5 (LL) 11.5 - 16.0 g/dL    HCT 79.5 (L) 65.1 - 46.0 %    MCV 89.5 78.0 - 100.0 fL    MCH 28.5 26.0 - 32.0 pg    MCHC 31.9 31.0 - 35.5 g/dL    RDW-CV 83.0 (H) 88.4 - 15.5 %    PLATELETS 40 (L) 150 - 400 x10^3/uL    MPV 11.5 8.7 - 12.5 fL   BASIC METABOLIC PANEL    Collection Time: 07/16/24  4:45 AM   Result Value Ref Range    SODIUM 145 (H) 133 - 144 mmol/L     POTASSIUM 3.6 3.2 - 5.0 mmol/L    CHLORIDE 107 (H) 96 - 106 mmol/L    CO2 TOTAL 23 22 - 30 mmol/L    ANION GAP 15 7 - 18 mmol/L    CALCIUM 8.3 8.3 - 10.7 mg/dL    GLUCOSE 885 (H) 74 - 109 mg/dL    BUN 54 (H) 8 - 23 mg/dL    CREATININE 8.83 (H) 0.50 - 0.90 mg/dL    BUN/CREA RATIO 47     ESTIMATED GFR 52 (L) >90 mL/min/1.76m^2   MAGNESIUM     Collection Time: 07/16/24  4:45 AM   Result Value Ref Range    MAGNESIUM  1.9 1.6 - 2.4 mg/dL   PHOSPHORUS    Collection Time: 07/16/24  4:45 AM   Result Value Ref Range    PHOSPHORUS 2.7 2.5 - 4.5 mg/dL   HEPATIC FUNCTION PANEL    Collection Time: 07/16/24  4:45 AM   Result Value Ref Range    ALBUMIN 2.8 (L) 3.5 - 5.2 g/dL    ALKALINE PHOSPHATASE 451 (H) 35 - 129 U/L    ALT (SGPT) 502 (H) 0 - 33 U/L    AST (SGOT) 950 (H) 0 - 32 U/L    BILIRUBIN TOTAL 4.7 (H) 0.2 - 1.2 mg/dL    BILIRUBIN DIRECT 3.3 (H) 0.0 - 0.3 mg/dL    PROTEIN TOTAL 5.0 (L) 6.4 - 8.3 g/dL   CROSSMATCH RED CELLS - UNITS , 1 Units    Collection Time: 07/16/24  5:45 AM   Result Value Ref Range    Coding System [id number]     UNIT NUMBER [id number]     BLOOD COMPONENT TYPE LR RBC, Adsol1, 04710     UNIT DIVISION 00     UNIT DISPENSE STATUS ISSUED     TRANSFUSION STATUS OK TO TRANSFUSE     IS CROSSMATCH Electronically Compatible     Product Code [id number]    TYPE AND SCREEN    Collection Time: 07/16/24  5:45 AM   Result Value Ref Range    UNITS ORDERED 1     ABO/RH(D) O POSITIVE     ANTIBODY SCREEN NEGATIVE     SPECIMEN EXPIRATION DATE 08/01/2024,2359    PT/INR    Collection Time: 07/16/24  9:17 AM  Result Value Ref Range    PROTHROMBIN TIME 19.2 (H) 12.1 - 15.3 seconds    INR 1.59 (H) 0.86 - 1.14    Narrative    In the setting of warfarin therapy, a moderate-intensity INR goal range is 2.0 to 3.0 and a high-intensity INR goal range is 2.5 to 3.5.    INR is ONLY validated to determine the level of anticoagulation with vitamin K antagonists (warfarin). Other factors may elevate the INR including but not limited to  direct oral anticoagulants (DOACs), liver dysfunction, vitamin K deficiency, DIC, factor deficiencies, and factor inhibitors.   PRODUCT: PLATELETS - UNITS , 1 Units    Collection Time: 07/16/24  9:45 AM   Result Value Ref Range    Coding System [id number]     UNIT NUMBER [id number]     BLOOD COMPONENT TYPE PATHOGEN REDUCED PLASMA REDUCED1     UNIT DIVISION 00     UNIT DISPENSE STATUS ISSUED     TRANSFUSION STATUS OK TO TRANSFUSE     Product Code [id number]    PLATELET COUNT    Collection Time: 07/16/24  5:36 PM   Result Value Ref Range    PLATELETS 46 (L) 150 - 400 x10^3/uL    MPV      Narrative    Mean Platelet Volume - Not Reported            Imaging:    Results for orders placed or performed during the hospital encounter of 07/08/24 (from the past 24 hours)   CT BRAIN WO IV CONTRAST     Status: None    Narrative    Anjela JEAN Markovic    CT BRAIN WO IV CONTRAST performed on 07/16/2024 6:33 AM.    INDICATION:  Subdural hematoma   Additional History:  Subdural hematoma    TECHNIQUE:  Unenhanced CT head with axial, coronal, and sagittal multiplanar reformations. Dose modulation, automated exposure control, and/or iterative reconstruction were used for dose reduction.    COMPARISON: Previous day    FINDINGS:  Small amount of acute blood in the subdural space overlying the left frontal lobe anteriorly and also superiorly is stable in appearance. No significant mass effect or midline shift.    No acute blood products are seen elsewhere. No adverse interval change is seen.              Impression    Stable exam  .  .  .  s/d/g        Radiologist location ID: [id number]     NUC CHOLESCINTIGRAPHY HEPATO IMAGING WO EF     Status: None    Narrative    Maneh JEAN Eddinger    NUC CHOLESCINTIGRAPHY HEPATO SYSTEM WITHOUT PHARM performed on 07/16/2024 6:44 PM.    INDICATION:  Increased bilirubin s/p cholecystectomy   Additional History:  abdominal pain, recent cholecystectomy, elevated liver enzymes    TECHNIQUE:  Routine hepatobiliary  scan performed with sequential anterior planar imaging of the abdomen.    RADIOPHARMACEUTICAL:  6.9mCi of Tc99m Mebrofenin IV    * Anterior images extending to 4 hours were completed.  * Activity is demonstrated throughout the liver which appears dominant in size, difficult to determine by nuclear medicine study, however enlargement CT 07/13/2024.  * There is marked delay in activity within the biliary system, demonstrated on the 2 and 4 hour delayed images, indicating hepatocellular dysfunction, clinical assessment required.  * 4 hour image reveals subtle increased activity lateral to the lower right hepatic  lobe, extending into the right abdomen and pelvis, in the expected region of the paracolic gutter. This is certainly worrisome for bile duct leak.      Impression    * IMPRESSION:    * Study indicating hepatic dysfunction with delay in activity within the biliary system, activity within the expected region of the right paracolic gutter on 4 hour study is worrisome for but not clearly diagnostic of a bile duct leak, ERCP with positive contrast evaluation may certainly be of value      Radiologist location ID: [id number]       I have reviewed a CT of the head performed this morning which shows stable intracranial subdural hematoma without worsening mass effect or midline shift.      Microbiology:  No results found for any visits on 07/08/24 (from the past 96 hours).        Assessment/ Plan:   Active Hospital Problems    Diagnosis    Primary Problem: Acute hypoxic respiratory failure (CMS HCC)    Subdural hematoma (CMS HCC)    NSVT (nonsustained ventricular tachycardia) (CMS HCC)    Heart failure, unspecified HF chronicity, unspecified heart failure type (CMS HCC)    Acute cholecystitis    Thickening of wall of gallbladder with pericholecystic fluid    Symptomatic anemia    COVID-19    Acute blood loss anemia    Transaminitis    Malignant neoplasm of lung, unspecified laterality, unspecified part of lung (CMS  HCC)    Pulmonary HTN (CMS HCC)    Pneumonia due to COVID-19 virus    Mood disorder (CMS HCC)    Anemia, unspecified type    Cigarette smoker    Metastatic cancer to liver    History of breast cancer    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  She is cleared to transfer to the floor with q.4 hour neuro checks.  Recommend holding anticoagulation and antiplatelet medications for 2 weeks.  We will plan to follow up with the patient in a 2 week period with CT of the head prior to clinic to clear her to restart her aspirin .  Recommend blood pressure control to be less than 150 mm systolic.  Recommend PT/OT as needed.  Please call with questions or concerns prior to discharge.  Thank you for the consult.    Disposition Planning:   Okay to transfer to the floor.    [place] JINNY Piety, PA-C    This note was partially generated using MModal Fluency Direct system, and there may be some incorrect words, spellings, and punctuation that were not noted in checking the note before saving.

## 2024-07-16 NOTE — Assessment & Plan Note (Signed)
S/p mastectomy 

## 2024-07-16 NOTE — Assessment & Plan Note (Signed)
 Pressure control  Continue losartan , metoprolol 

## 2024-07-16 NOTE — Assessment & Plan Note (Signed)
 History of CABG x1   Has been on hold due to bleeding  Continue atorvastatin , metoprolol 

## 2024-07-16 NOTE — Assessment & Plan Note (Signed)
 S/p fall on 07/14/2024 during night  Stat CT head done on 10/06 showed small volume of acute hemorrhage in the subdural space overlying the left frontal lobe anteriorly and superiorly  CT head repeated this morning showed stable subdural hemorrhage  Platelet count 40 K, received 2 units platelets yesterday   Will transfuse with 2 more units platelets today , needs platelet to be around 100 K  Neurosurgery evaluated patient, does not need any intervention   Appreciate neurology and neurosurgery input

## 2024-07-16 NOTE — Assessment & Plan Note (Signed)
 Patient developed nonsustained V-tach on 10/03   S/p amiodarone  drip   Continue amiodarone   200 mg p.o. daily   Evaluated by Cardiology, patient is not a candidate for anticoagulation due to anemia and thrombocytopenia  Appreciate cardiology input

## 2024-07-16 NOTE — Assessment & Plan Note (Signed)
 Acute blood loss anemia or acute on chronic anemia, and mild bleeding from 1 of the laparoscopic incisions  Likely secondary to acute bleeding and underlying metastatic disease  S/p vitamin K 10 mg IV x1 dose on 10/03,   S/p 2 units PRBC transfusion on 10/3  She also received 1 units platelets and 2 units FFP on 10/04   CT abdomen done on 10/03 evening showed hematoma associated with the gallbladder fossa with fluid tracking inferiorly, moderate volume hemoperitoneum  CT abdomen repeated on 10/04 due to patient complaining of worsening abdomen pain and distention, showed stable hematoma in gallbladder fossa with slight decrease in volume of hemoperitoneum  S/p suture of bleeding laparoscopic incision by General surgery on 10/05 and bleeding has been controlled   S/p 1 FFP and 1 unit PRBC on 10/05  2 units platelet transfusion on 10/06   Hemoglobin today 6.5, platelet count 40 K  Will transfuse 1 more unit PRBC today   Anemia panel not suggestive of iron  deficiency  Stool occult blood   Monitor CBC and transfuse for hemoglobin less than 7  General surgery and Hematology are following, appreciate input

## 2024-07-16 NOTE — Assessment & Plan Note (Signed)
 Pulmonary is following.

## 2024-07-17 ENCOUNTER — Inpatient Hospital Stay (HOSPITAL_COMMUNITY)

## 2024-07-17 ENCOUNTER — Encounter (HOSPITAL_COMMUNITY): Payer: Self-pay

## 2024-07-17 DIAGNOSIS — J9690 Respiratory failure, unspecified, unspecified whether with hypoxia or hypercapnia: Secondary | ICD-10-CM

## 2024-07-17 DIAGNOSIS — Z452 Encounter for adjustment and management of vascular access device: Secondary | ICD-10-CM

## 2024-07-17 DIAGNOSIS — I472 Ventricular tachycardia, unspecified: Secondary | ICD-10-CM

## 2024-07-17 DIAGNOSIS — I959 Hypotension, unspecified: Secondary | ICD-10-CM

## 2024-07-17 DIAGNOSIS — R6521 Severe sepsis with septic shock: Secondary | ICD-10-CM

## 2024-07-17 DIAGNOSIS — K661 Hemoperitoneum: Secondary | ICD-10-CM

## 2024-07-17 DIAGNOSIS — R7989 Other specified abnormal findings of blood chemistry: Secondary | ICD-10-CM

## 2024-07-17 DIAGNOSIS — R0902 Hypoxemia: Secondary | ICD-10-CM

## 2024-07-17 DIAGNOSIS — Z978 Presence of other specified devices: Secondary | ICD-10-CM

## 2024-07-17 DIAGNOSIS — I110 Hypertensive heart disease with heart failure: Secondary | ICD-10-CM

## 2024-07-17 DIAGNOSIS — R932 Abnormal findings on diagnostic imaging of liver and biliary tract: Secondary | ICD-10-CM

## 2024-07-17 DIAGNOSIS — A419 Sepsis, unspecified organism: Secondary | ICD-10-CM

## 2024-07-17 DIAGNOSIS — K839 Disease of biliary tract, unspecified: Secondary | ICD-10-CM

## 2024-07-17 DIAGNOSIS — R918 Other nonspecific abnormal finding of lung field: Secondary | ICD-10-CM

## 2024-07-17 LAB — PHOSPHORUS
PHOSPHORUS: 6.6 mg/dL — ABNORMAL HIGH (ref 2.5–4.5)
PHOSPHORUS: 6.7 mg/dL — ABNORMAL HIGH (ref 2.5–4.5)
PHOSPHORUS: 6.4 mg/dL — ABNORMAL HIGH (ref 2.5–4.5)
PHOSPHORUS: 4.5 mg/dL (ref 2.5–4.5)
PHOSPHORUS: 4.7 mg/dL — ABNORMAL HIGH (ref 2.5–4.5)
PHOSPHORUS: 4.1 mg/dL (ref 2.5–4.5)

## 2024-07-17 LAB — BLOOD GAS W/ CO-OX, LYTES, LACTATE REFLEX
%FIO2 (ARTERIAL): 100 %
%FIO2 (ARTERIAL): 100 %
%FIO2 (ARTERIAL): 50 %
%FIO2 (ARTERIAL): 40 %
%FIO2 (ARTERIAL): 50 %
BASE DEFICIT: 18.2 mmol/L — ABNORMAL HIGH (ref 0.0–3.0)
BASE DEFICIT: 11.4 mmol/L — ABNORMAL HIGH (ref 0.0–3.0)
BASE EXCESS (ARTERIAL): 2.9 mmol/L (ref 0.0–3.0)
BASE EXCESS (ARTERIAL): 4.4 mmol/L — ABNORMAL HIGH (ref 0.0–3.0)
BASE EXCESS (ARTERIAL): 4.7 mmol/L — ABNORMAL HIGH (ref 0.0–3.0)
BICARBONATE (ARTERIAL): 10.8 mmol/L — ABNORMAL LOW (ref 21.0–28.0)
BICARBONATE (ARTERIAL): 16.1 mmol/L — ABNORMAL LOW (ref 21.0–28.0)
BICARBONATE (ARTERIAL): 27.2 mmol/L (ref 21.0–28.0)
BICARBONATE (ARTERIAL): 28.3 mmol/L — ABNORMAL HIGH (ref 21.0–28.0)
BICARBONATE (ARTERIAL): 28.6 mmol/L — ABNORMAL HIGH (ref 21.0–28.0)
CARBOXYHEMOGLOBIN: 1.8 % (ref <=3.0)
CARBOXYHEMOGLOBIN: 1.7 % (ref <=3.0)
CARBOXYHEMOGLOBIN: 1.4 % (ref <=3.0)
CARBOXYHEMOGLOBIN: 1.9 % (ref <=3.0)
CARBOXYHEMOGLOBIN: 1.4 % (ref <=3.0)
CHLORIDE: 104 mmol/L (ref 98–107)
CHLORIDE: 104 mmol/L (ref 98–107)
CHLORIDE: 105 mmol/L (ref 98–107)
CHLORIDE: 104 mmol/L (ref 98–107)
CHLORIDE: 104 mmol/L (ref 98–107)
GLUCOSE: 33 mg/dL — CL (ref 65–125)
GLUCOSE: 172 mg/dL — ABNORMAL HIGH (ref 65–125)
GLUCOSE: 198 mg/dL — ABNORMAL HIGH (ref 65–125)
GLUCOSE: 138 mg/dL — ABNORMAL HIGH (ref 65–125)
GLUCOSE: 138 mg/dL — ABNORMAL HIGH (ref 65–125)
HEMATOCRITRT: 20 % — ABNORMAL LOW (ref 37–50)
HEMATOCRITRT: 28 % — ABNORMAL LOW (ref 37–50)
HEMATOCRITRT: 27 % — ABNORMAL LOW (ref 37–50)
HEMATOCRITRT: 33 % — ABNORMAL LOW (ref 37–50)
HEMATOCRITRT: 29 % — ABNORMAL LOW (ref 37–50)
HEMOGLOBIN: 6.8 g/dL — CL (ref 12.0–18.0)
HEMOGLOBIN: 9.3 g/dL — ABNORMAL LOW (ref 12.0–18.0)
HEMOGLOBIN: 9 g/dL — ABNORMAL LOW (ref 12.0–18.0)
HEMOGLOBIN: 11.1 g/dL — ABNORMAL LOW (ref 12.0–18.0)
HEMOGLOBIN: 9.8 g/dL — ABNORMAL LOW (ref 12.0–18.0)
IONIZED CALCIUM: 1.09 mmol/L — ABNORMAL LOW (ref 1.15–1.33)
IONIZED CALCIUM: 1.14 mmol/L — ABNORMAL LOW (ref 1.15–1.33)
IONIZED CALCIUM: 1.22 mmol/L (ref 1.15–1.33)
IONIZED CALCIUM: 1.21 mmol/L (ref 1.15–1.33)
IONIZED CALCIUM: 1.2 mmol/L (ref 1.15–1.33)
LACTATE: >17 mmol/L (ref <=1.9)
LACTATE: 14.8 mmol/L (ref <=1.9)
LACTATE: 5.9 mmol/L (ref <=1.9)
LACTATE: 5 mmol/L (ref <=1.9)
LACTATE: 3.9 mmol/L — ABNORMAL HIGH (ref <=1.9)
MET-HEMOGLOBIN: 0.8 % (ref <=1.5)
MET-HEMOGLOBIN: <0.7 % (ref <=1.5)
MET-HEMOGLOBIN: <0.7 % (ref <=1.5)
MET-HEMOGLOBIN: <0.7 % (ref <=1.5)
MET-HEMOGLOBIN: 1.2 % (ref <=1.5)
O2 SATURATION (ARTERIAL): 98.2 % (ref 94.0–98.0)
O2 SATURATION (ARTERIAL): 100 % (ref 94.0–98.0)
O2 SATURATION (ARTERIAL): 98.2 % (ref 94.0–98.0)
O2 SATURATION (ARTERIAL): 93.7 % (ref 94.0–98.0)
O2 SATURATION (ARTERIAL): 97.8 % (ref 94.0–98.0)
O2CT: 9.6 %
O2CT: 14.1 %
O2CT: 12.6 %
O2CT: 14.8 %
O2CT: 13.4 %
OXYHEMOGLOBIN: 97.4 % — ABNORMAL HIGH (ref 90.0–95.0)
OXYHEMOGLOBIN: 98.3 % — ABNORMAL HIGH (ref 90.0–95.0)
OXYHEMOGLOBIN: 97.9 % — ABNORMAL HIGH (ref 90.0–95.0)
OXYHEMOGLOBIN: 94.8 % (ref 90.0–95.0)
OXYHEMOGLOBIN: 96.5 % — ABNORMAL HIGH (ref 90.0–95.0)
PAO2/FIO2 RATIO: 129
PAO2/FIO2 RATIO: 453
PAO2/FIO2 RATIO: 214
PAO2/FIO2 RATIO: 173
PAO2/FIO2 RATIO: 192
PCO2 (ARTERIAL): 20 mmHg — ABNORMAL LOW (ref 35–45)
PCO2 (ARTERIAL): 35 mmHg (ref 35–45)
PCO2 (ARTERIAL): 44 mmHg (ref 35–45)
PCO2 (ARTERIAL): 47 mmHg — ABNORMAL HIGH (ref 35–45)
PCO2 (ARTERIAL): 42 mmHg (ref 35–45)
PH (ARTERIAL): 7.21 — ABNORMAL LOW (ref 7.35–7.45)
PH (ARTERIAL): 7.24 — ABNORMAL LOW (ref 7.35–7.45)
PH (ARTERIAL): 7.41 (ref 7.35–7.45)
PH (ARTERIAL): 7.41 (ref 7.35–7.45)
PH (ARTERIAL): 7.45 (ref 7.35–7.45)
PO2 (ARTERIAL): 129 mmHg — ABNORMAL HIGH (ref 83–108)
PO2 (ARTERIAL): 453 mmHg — ABNORMAL HIGH (ref 83–108)
PO2 (ARTERIAL): 107 mmHg (ref 83–108)
PO2 (ARTERIAL): 69 mmHg — ABNORMAL LOW (ref 83–108)
PO2 (ARTERIAL): 96 mmHg (ref 83–108)
SODIUM: 147 mmol/L — ABNORMAL HIGH (ref 136–145)
SODIUM: 144 mmol/L (ref 136–145)
SODIUM: 143 mmol/L (ref 136–145)
SODIUM: 143 mmol/L (ref 136–145)
SODIUM: 145 mmol/L (ref 136–145)
WHOLE BLOOD POTASSIUM: 5.2 mmol/L — ABNORMAL HIGH (ref 3.5–5.1)
WHOLE BLOOD POTASSIUM: 3.7 mmol/L (ref 3.5–5.1)
WHOLE BLOOD POTASSIUM: 3.3 mmol/L — ABNORMAL LOW (ref 3.5–5.1)
WHOLE BLOOD POTASSIUM: 3.6 mmol/L (ref 3.5–5.1)
WHOLE BLOOD POTASSIUM: 3.4 mmol/L — ABNORMAL LOW (ref 3.5–5.1)

## 2024-07-17 LAB — COMPREHENSIVE METABOLIC PANEL, NON-FASTING
ALBUMIN: 2.7 g/dL — ABNORMAL LOW (ref 3.5–5.2)
ALBUMIN: 2.6 g/dL — ABNORMAL LOW (ref 3.5–5.2)
ALBUMIN: 3.1 g/dL — ABNORMAL LOW (ref 3.5–5.2)
ALBUMIN: 3.1 g/dL — ABNORMAL LOW (ref 3.5–5.2)
ALBUMIN: 3 g/dL — ABNORMAL LOW (ref 3.5–5.2)
ALBUMIN: 2.9 g/dL — ABNORMAL LOW (ref 3.5–5.2)
ALKALINE PHOSPHATASE: 437 U/L — ABNORMAL HIGH (ref 35–129)
ALKALINE PHOSPHATASE: 370 U/L — ABNORMAL HIGH (ref 35–129)
ALKALINE PHOSPHATASE: 302 U/L — ABNORMAL HIGH (ref 35–129)
ALKALINE PHOSPHATASE: 310 U/L — ABNORMAL HIGH (ref 35–129)
ALKALINE PHOSPHATASE: 297 U/L — ABNORMAL HIGH (ref 35–129)
ALKALINE PHOSPHATASE: 308 U/L — ABNORMAL HIGH (ref 35–129)
ALT (SGPT): 542 U/L — ABNORMAL HIGH (ref 0–33)
ALT (SGPT): 599 U/L — ABNORMAL HIGH (ref 0–33)
ALT (SGPT): 743 U/L — ABNORMAL HIGH (ref 0–33)
ALT (SGPT): 1025 U/L — ABNORMAL HIGH (ref 0–33)
ALT (SGPT): 1038 U/L — ABNORMAL HIGH (ref 0–33)
ALT (SGPT): 1143 U/L — ABNORMAL HIGH (ref 0–33)
ANION GAP: 33 mmol/L — ABNORMAL HIGH (ref 7–18)
ANION GAP: 32 mmol/L — ABNORMAL HIGH (ref 7–18)
ANION GAP: 24 mmol/L — ABNORMAL HIGH (ref 7–18)
ANION GAP: 19 mmol/L — ABNORMAL HIGH (ref 7–18)
ANION GAP: 20 mmol/L — ABNORMAL HIGH (ref 7–18)
ANION GAP: 18 mmol/L (ref 7–18)
AST (SGOT): 1060 U/L — ABNORMAL HIGH (ref 0–32)
AST (SGOT): 1184 U/L — ABNORMAL HIGH (ref 0–32)
AST (SGOT): 1532 U/L — ABNORMAL HIGH (ref 0–32)
AST (SGOT): 2418 U/L — ABNORMAL HIGH (ref 0–32)
AST (SGOT): 2763 U/L — ABNORMAL HIGH (ref 0–32)
AST (SGOT): 3018 U/L — ABNORMAL HIGH (ref 0–32)
BILIRUBIN TOTAL: 5.9 mg/dL — ABNORMAL HIGH (ref 0.2–1.2)
BILIRUBIN TOTAL: 5.2 mg/dL — ABNORMAL HIGH (ref 0.2–1.2)
BILIRUBIN TOTAL: 5 mg/dL — ABNORMAL HIGH (ref 0.2–1.2)
BILIRUBIN TOTAL: 5.6 mg/dL — ABNORMAL HIGH (ref 0.2–1.2)
BILIRUBIN TOTAL: 5.7 mg/dL — ABNORMAL HIGH (ref 0.2–1.2)
BILIRUBIN TOTAL: 6.4 mg/dL — ABNORMAL HIGH (ref 0.2–1.2)
BUN: 63 mg/dL — ABNORMAL HIGH (ref 8–23)
BUN: 63 mg/dL — ABNORMAL HIGH (ref 8–23)
BUN: 59 mg/dL — ABNORMAL HIGH (ref 8–23)
BUN: 59 mg/dL — ABNORMAL HIGH (ref 8–23)
BUN: 56 mg/dL — ABNORMAL HIGH (ref 8–23)
BUN: 60 mg/dL — ABNORMAL HIGH (ref 8–23)
CALCIUM: 8.6 mg/dL (ref 8.3–10.7)
CALCIUM: 9.1 mg/dL (ref 8.3–10.7)
CALCIUM: 9.3 mg/dL (ref 8.3–10.7)
CALCIUM: 9.4 mg/dL (ref 8.3–10.7)
CALCIUM: 9.4 mg/dL (ref 8.3–10.7)
CALCIUM: 9.4 mg/dL (ref 8.3–10.7)
CHLORIDE: 103 mmol/L (ref 96–106)
CHLORIDE: 103 mmol/L (ref 96–106)
CHLORIDE: 101 mmol/L (ref 96–106)
CHLORIDE: 103 mmol/L (ref 96–106)
CHLORIDE: 103 mmol/L (ref 96–106)
CHLORIDE: 102 mmol/L (ref 96–106)
CO2 TOTAL: 9 mmol/L — CL (ref 22–30)
CO2 TOTAL: 14 mmol/L — ABNORMAL LOW (ref 22–30)
CO2 TOTAL: 24 mmol/L (ref 22–30)
CO2 TOTAL: 27 mmol/L (ref 22–30)
CO2 TOTAL: 26 mmol/L (ref 22–30)
CO2 TOTAL: 28 mmol/L (ref 22–30)
CREATININE: 1.68 mg/dL — ABNORMAL HIGH (ref 0.50–0.90)
CREATININE: 1.56 mg/dL — ABNORMAL HIGH (ref 0.50–0.90)
CREATININE: 1.5 mg/dL — ABNORMAL HIGH (ref 0.50–0.90)
CREATININE: 1.55 mg/dL — ABNORMAL HIGH (ref 0.50–0.90)
CREATININE: 1.46 mg/dL — ABNORMAL HIGH (ref 0.50–0.90)
CREATININE: 1.65 mg/dL — ABNORMAL HIGH (ref 0.50–0.90)
ESTIMATED GFR: 33 mL/min/1.73mˆ2 — ABNORMAL LOW (ref >90)
ESTIMATED GFR: 36 mL/min/1.73mˆ2 — ABNORMAL LOW (ref >90)
ESTIMATED GFR: 38 mL/min/1.73mˆ2 — ABNORMAL LOW (ref >90)
ESTIMATED GFR: 36 mL/min/1.73mˆ2 — ABNORMAL LOW (ref >90)
ESTIMATED GFR: 39 mL/min/1.73mˆ2 — ABNORMAL LOW (ref >90)
ESTIMATED GFR: 34 mL/min/1.73mˆ2 — ABNORMAL LOW (ref >90)
GLUCOSE: 41 mg/dL — CL (ref 74–109)
GLUCOSE: 192 mg/dL — ABNORMAL HIGH (ref 74–109)
GLUCOSE: 319 mg/dL — ABNORMAL HIGH (ref 74–109)
GLUCOSE: 206 mg/dL — ABNORMAL HIGH (ref 74–109)
GLUCOSE: 150 mg/dL — ABNORMAL HIGH (ref 74–109)
GLUCOSE: 152 mg/dL — ABNORMAL HIGH (ref 74–109)
POTASSIUM: 4.7 mmol/L (ref 3.2–5.0)
POTASSIUM: 3.8 mmol/L (ref 3.2–5.0)
POTASSIUM: 3.4 mmol/L (ref 3.2–5.0)
POTASSIUM: 3.3 mmol/L (ref 3.2–5.0)
POTASSIUM: 3.7 mmol/L (ref 3.2–5.0)
POTASSIUM: 3.5 mmol/L (ref 3.2–5.0)
PROTEIN TOTAL: 4.8 g/dL — ABNORMAL LOW (ref 6.4–8.3)
PROTEIN TOTAL: 4.5 g/dL — ABNORMAL LOW (ref 6.4–8.3)
PROTEIN TOTAL: 5.3 g/dL — ABNORMAL LOW (ref 6.4–8.3)
PROTEIN TOTAL: 5.2 g/dL — ABNORMAL LOW (ref 6.4–8.3)
PROTEIN TOTAL: 5.2 g/dL — ABNORMAL LOW (ref 6.4–8.3)
PROTEIN TOTAL: 5.2 g/dL — ABNORMAL LOW (ref 6.4–8.3)
SODIUM: 145 mmol/L — ABNORMAL HIGH (ref 133–144)
SODIUM: 149 mmol/L — ABNORMAL HIGH (ref 133–144)
SODIUM: 149 mmol/L — ABNORMAL HIGH (ref 133–144)
SODIUM: 149 mmol/L — ABNORMAL HIGH (ref 133–144)
SODIUM: 149 mmol/L — ABNORMAL HIGH (ref 133–144)
SODIUM: 148 mmol/L — ABNORMAL HIGH (ref 133–144)

## 2024-07-17 LAB — PRODUCT: PLATELETS - UNITS: UNIT DIVISION: 0

## 2024-07-17 LAB — PRODUCT: FFP/PLASMA - UNITS
UNIT DIVISION: 0
UNIT DIVISION: 0

## 2024-07-17 LAB — CBC
HCT: 20.4 % — ABNORMAL LOW (ref 34.8–46.0)
HCT: 21.5 % — ABNORMAL LOW (ref 34.8–46.0)
HCT: 25.6 % — ABNORMAL LOW (ref 34.8–46.0)
HCT: 21.5 % — ABNORMAL LOW (ref 34.8–46.0)
HCT: 26.8 % — ABNORMAL LOW (ref 34.8–46.0)
HCT: 27.7 % — ABNORMAL LOW (ref 34.8–46.0)
HGB: 6 g/dL — CL (ref 11.5–16.0)
HGB: 6.6 g/dL — CL (ref 11.5–16.0)
HGB: 8.3 g/dL — ABNORMAL LOW (ref 11.5–16.0)
HGB: 7.1 g/dL — ABNORMAL LOW (ref 11.5–16.0)
HGB: 9.2 g/dL — ABNORMAL LOW (ref 11.5–16.0)
HGB: 9.3 g/dL — ABNORMAL LOW (ref 11.5–16.0)
MCH: 27.9 pg (ref 26.0–32.0)
MCH: 28.9 pg (ref 26.0–32.0)
MCH: 28.8 pg (ref 26.0–32.0)
MCH: 29 pg (ref 26.0–32.0)
MCH: 29.1 pg (ref 26.0–32.0)
MCH: 28.5 pg (ref 26.0–32.0)
MCHC: 29.4 g/dL — ABNORMAL LOW (ref 31.0–35.5)
MCHC: 30.7 g/dL — ABNORMAL LOW (ref 31.0–35.5)
MCHC: 32.4 g/dL (ref 31.0–35.5)
MCHC: 33 g/dL (ref 31.0–35.5)
MCHC: 34.3 g/dL (ref 31.0–35.5)
MCHC: 33.6 g/dL (ref 31.0–35.5)
MCV: 94.9 fL (ref 78.0–100.0)
MCV: 94.3 fL (ref 78.0–100.0)
MCV: 88.9 fL (ref 78.0–100.0)
MCV: 87.8 fL (ref 78.0–100.0)
MCV: 84.8 fL (ref 78.0–100.0)
MCV: 85 fL (ref 78.0–100.0)
MPV: 11 fL (ref 8.7–12.5)
MPV: 11.1 fL (ref 8.7–12.5)
MPV: 10.9 fL (ref 8.7–12.5)
MPV: 10.8 fL (ref 8.7–12.5)
MPV: 10.1 fL (ref 8.7–12.5)
PLATELETS: 46 x10ˆ3/uL — ABNORMAL LOW (ref 150–400)
PLATELETS: 45 x10ˆ3/uL — ABNORMAL LOW (ref 150–400)
PLATELETS: 58 x10ˆ3/uL — ABNORMAL LOW (ref 150–400)
PLATELETS: 42 x10ˆ3/uL — ABNORMAL LOW (ref 150–400)
PLATELETS: 49 x10ˆ3/uL — ABNORMAL LOW (ref 150–400)
PLATELETS: 45 x10ˆ3/uL — ABNORMAL LOW (ref 150–400)
RBC: 2.15 x10ˆ6/uL — ABNORMAL LOW (ref 3.85–5.22)
RBC: 2.28 x10ˆ6/uL — ABNORMAL LOW (ref 3.85–5.22)
RBC: 2.88 x10ˆ6/uL — ABNORMAL LOW (ref 3.85–5.22)
RBC: 2.45 x10ˆ6/uL — ABNORMAL LOW (ref 3.85–5.22)
RBC: 3.16 x10ˆ6/uL — ABNORMAL LOW (ref 3.85–5.22)
RBC: 3.26 x10ˆ6/uL — ABNORMAL LOW (ref 3.85–5.22)
RDW-CV: 16.5 % — ABNORMAL HIGH (ref 11.5–15.5)
RDW-CV: 16.9 % — ABNORMAL HIGH (ref 11.5–15.5)
RDW-CV: 14.8 % (ref 11.5–15.5)
RDW-CV: 15 % (ref 11.5–15.5)
RDW-CV: 14.9 % (ref 11.5–15.5)
RDW-CV: 15.6 % — ABNORMAL HIGH (ref 11.5–15.5)
WBC: 12.3 x10ˆ3/uL — ABNORMAL HIGH (ref 3.7–11.0)
WBC: 11.3 x10ˆ3/uL — ABNORMAL HIGH (ref 3.7–11.0)
WBC: 6.7 x10ˆ3/uL (ref 3.7–11.0)
WBC: 6.9 x10ˆ3/uL (ref 3.7–11.0)
WBC: 5.5 x10ˆ3/uL (ref 3.7–11.0)
WBC: 6.2 x10ˆ3/uL (ref 3.7–11.0)

## 2024-07-17 LAB — TRANSTHORACIC ECHOCARDIOGRAM - ADULT
EF VISUAL ESTIMATE: 55
EF: 60

## 2024-07-17 LAB — MAGNESIUM
MAGNESIUM: 2.5 mg/dL — ABNORMAL HIGH (ref 1.6–2.4)
MAGNESIUM: 2.1 mg/dL (ref 1.6–2.4)
MAGNESIUM: 1.9 mg/dL (ref 1.6–2.4)
MAGNESIUM: 1.9 mg/dL (ref 1.6–2.4)
MAGNESIUM: 1.8 mg/dL (ref 1.6–2.4)
MAGNESIUM: 1.8 mg/dL (ref 1.6–2.4)

## 2024-07-17 LAB — URINALYSIS, MACRO/MICRO
BACTERIA: 153.2 /HPF — ABNORMAL HIGH (ref <=0)
GLUCOSE: NEGATIVE mg/dL
LEUKOCYTES: NEGATIVE WBCs/uL
NITRITE: NEGATIVE
PH: 5 (ref 5.0–7.0)
RBCS: 12.5 /HPF — ABNORMAL HIGH (ref <=3.0)
SPECIFIC GRAVITY: 1.018 (ref 1.005–1.025)
SQUAMOUS EPITHELIAL CELLS: 3.5 /HPF (ref 0.0–10.0)
TOTAL CASTS: 20.6 /LPF — ABNORMAL HIGH (ref <=2.0)
UROBILINOGEN: 1 mg/dL
WBCS: 2.3 /HPF (ref <=4.0)

## 2024-07-17 LAB — PROCALCITONIN: PROCALCITONIN: 79.8 ng/mL — ABNORMAL HIGH (ref 0.00–0.50)

## 2024-07-17 LAB — PT/INR
INR: 2.52 — ABNORMAL HIGH (ref 0.86–1.14)
INR: 2.19 — ABNORMAL HIGH (ref 0.86–1.14)
INR: 1.84 — ABNORMAL HIGH (ref 0.86–1.14)
INR: 1.77 — ABNORMAL HIGH (ref 0.86–1.14)
INR: 1.84 — ABNORMAL HIGH (ref 0.86–1.14)
INR: 2 — ABNORMAL HIGH (ref 0.86–1.14)
PROTHROMBIN TIME: 27.1 s — ABNORMAL HIGH (ref 12.1–15.3)
PROTHROMBIN TIME: 24.4 s — ABNORMAL HIGH (ref 12.1–15.3)
PROTHROMBIN TIME: 21.4 s — ABNORMAL HIGH (ref 12.1–15.3)
PROTHROMBIN TIME: 20.8 s — ABNORMAL HIGH (ref 12.1–15.3)
PROTHROMBIN TIME: 21.4 s — ABNORMAL HIGH (ref 12.1–15.3)
PROTHROMBIN TIME: 22.8 s — ABNORMAL HIGH (ref 12.1–15.3)

## 2024-07-17 LAB — TEG, RAPID GLOBAL WITH LYSIS (TRAUMA)
LYS30 (%): 0 % (ref 0.0–2.6)
MA (CRT RAPID): 47.3 mm — ABNORMAL LOW (ref 52.0–70.0)
MA FIBRINOGEN (CFF): 19.1 mm (ref 15.0–32.0)
R (CK): >17 min — ABNORMAL HIGH (ref 4.6–9.1)

## 2024-07-17 LAB — IONIZED CALCIUM WITH PH
IONIZED CALCIUM: 1.09 mmol/L — ABNORMAL LOW (ref 1.15–1.33)
IONIZED CALCIUM: 1.12 mmol/L — ABNORMAL LOW (ref 1.15–1.33)
IONIZED CALCIUM: 1.12 mmol/L — ABNORMAL LOW (ref 1.15–1.33)
IONIZED CALCIUM: 1.19 mmol/L (ref 1.15–1.33)
IONIZED CALCIUM: 1.2 mmol/L (ref 1.15–1.33)
IONIZED CALCIUM: 1.2 mmol/L (ref 1.15–1.33)
PH (VENOUS): 7.08 — CL (ref 7.32–7.43)
PH (VENOUS): 7.29 — ABNORMAL LOW (ref 7.32–7.43)
PH (VENOUS): 7.35 (ref 7.32–7.43)
PH (VENOUS): 7.46 — ABNORMAL HIGH (ref 7.32–7.43)
PH (VENOUS): 7.44 — ABNORMAL HIGH (ref 7.32–7.43)
PH (VENOUS): 7.45 — ABNORMAL HIGH (ref 7.32–7.43)

## 2024-07-17 LAB — POC BLOOD GLUCOSE (RESULTS)
GLUCOSE, POC: 153 mg/dL — ABNORMAL HIGH (ref 70–100)
GLUCOSE, POC: 220 mg/dL — ABNORMAL HIGH (ref 70–100)
GLUCOSE, POC: 289 mg/dL — ABNORMAL HIGH (ref 70–100)
GLUCOSE, POC: 294 mg/dL — ABNORMAL HIGH (ref 70–100)
GLUCOSE, POC: 176 mg/dL — ABNORMAL HIGH (ref 70–100)
GLUCOSE, POC: 188 mg/dL — ABNORMAL HIGH (ref 70–100)

## 2024-07-17 LAB — C-REACTIVE PROTEIN (CRP): C-REACTIVE PROTEIN (CRP): 8.5 mg/dL — ABNORMAL HIGH (ref 0.0–0.9)

## 2024-07-17 LAB — STREP PNEUMONIAE AND LEGIONELLA ANTIGEN, URINE
LEGIONELLA ANTIGEN: NEGATIVE
S.PNEUMONIAE ANTIGEN: NEGATIVE

## 2024-07-17 LAB — LACTIC ACID LEVEL W/ REFLEX FOR LEVEL >2.0
LACTIC ACID: 19.2 mmol/L (ref 0.5–2.0)
LACTIC ACID: 6.2 mmol/L (ref 0.5–2.0)

## 2024-07-17 LAB — FIBRINOGEN
FIBRINOGEN: 333 mg/dL (ref 251–576)
FIBRINOGEN: 305 mg/dL (ref 251–576)

## 2024-07-17 LAB — URIC ACID: URIC ACID: 15.5 mg/dL (ref 2.4–5.7)

## 2024-07-17 LAB — NT-PROBNP: NT-PROBNP: 5866 pg/mL — ABNORMAL HIGH (ref 0–125)

## 2024-07-17 LAB — PTT (PARTIAL THROMBOPLASTIN TIME)
APTT: 37.9 s — ABNORMAL HIGH (ref 22.4–34.8)
APTT: 35.7 s — ABNORMAL HIGH (ref 22.4–34.8)
APTT: 38 s — ABNORMAL HIGH (ref 22.4–34.8)
APTT: 38.7 s — ABNORMAL HIGH (ref 22.4–34.8)
APTT: 41.8 s — ABNORMAL HIGH (ref 22.4–34.8)
APTT: 46.9 s — ABNORMAL HIGH (ref 22.4–34.8)

## 2024-07-17 LAB — LACTIC ACID - SECOND REFLEX: LACTIC ACID: 7.1 mmol/L (ref 0.5–2.0)

## 2024-07-17 LAB — LDH: LDH: >2500 U/L — ABNORMAL HIGH (ref 135–214)

## 2024-07-17 LAB — TRIGLYCERIDE: TRIGLYCERIDES: 103 mg/dL (ref 0–150)

## 2024-07-17 LAB — AMMONIA: AMMONIA: 113 umol/L (ref 11–51)

## 2024-07-17 LAB — OCCULT BLOOD, STOOL: OCCULT BLOOD: POSITIVE — AB

## 2024-07-17 LAB — FERRITIN: FERRITIN: >2000 ng/mL — ABNORMAL HIGH (ref 13–150)

## 2024-07-17 LAB — LACTIC ACID - FIRST REFLEX: LACTIC ACID: 15.2 mmol/L (ref 0.5–2.0)

## 2024-07-17 LAB — CORTISOL, PLASMA OR SERUM: CORTISOL: 76.4 ug/dL

## 2024-07-17 MED ORDER — SODIUM BICARBONATE 8.4 % (1 MEQ/ML) INTRAVENOUS SYRINGE
INJECTION | Freq: Once | INTRAVENOUS | Status: AC | PRN
Start: 2024-07-17 — End: 2024-07-17
  Administered 2024-07-17: 150 meq via INTRAVENOUS

## 2024-07-17 MED ORDER — FENTANYL (PF) 50 MCG/ML INTRAVENOUS SOLUTION
0.0000 ug/kg/h | INTRAVENOUS | Status: DC
Start: 2024-07-17 — End: 2024-07-19
  Administered 2024-07-17 – 2024-07-19 (×53): 3 ug/kg/h via INTRAVENOUS
  Administered 2024-07-19: 0 ug/kg/h via INTRAVENOUS
  Administered 2024-07-19 (×2): 3 ug/kg/h via INTRAVENOUS
  Filled 2024-07-17 (×5): qty 50

## 2024-07-17 MED ORDER — PHENYLEPHRINE 10 MG/ML INJECTION SOLUTION
INTRAMUSCULAR | Status: AC
Start: 2024-07-17 — End: 2024-07-17
  Filled 2024-07-17: qty 10

## 2024-07-17 MED ORDER — POTASSIUM CHLORIDE 20 MEQ/100ML IN STERILE WATER INTRAVENOUS PIGGYBACK
20.0000 meq | INJECTION | Freq: Once | INTRAVENOUS | Status: AC
Start: 2024-07-17 — End: 2024-07-17
  Administered 2024-07-17: 0 meq via INTRAVENOUS
  Administered 2024-07-17: 20 meq via INTRAVENOUS
  Filled 2024-07-17: qty 100

## 2024-07-17 MED ORDER — DEXTROSE 50 % IN WATER (D50W) INTRAVENOUS SYRINGE
12.5000 g | INJECTION | INTRAVENOUS | Status: DC | PRN
Start: 2024-07-17 — End: 2024-07-19

## 2024-07-17 MED ORDER — CHLORHEXIDINE GLUCONATE 0.12 % MOUTHWASH
15.0000 mL | MOUTHWASH | Freq: Two times a day (BID) | Status: DC
Start: 2024-07-17 — End: 2024-07-19
  Administered 2024-07-17 – 2024-07-19 (×5): 15 mL via ORAL
  Filled 2024-07-17: qty 473

## 2024-07-17 MED ORDER — MIDAZOLAM 1 MG/ML INJECTION WRAPPER
2.0000 mg | INTRAMUSCULAR | Status: DC | PRN
  Administered 2024-07-17 – 2024-07-18 (×7): 2 mg via INTRAVENOUS
  Filled 2024-07-17 (×7): qty 2

## 2024-07-17 MED ORDER — ETOMIDATE 2 MG/ML INTRAVENOUS SOLUTION
Freq: Once | INTRAVENOUS | Status: AC | PRN
Start: 2024-07-17 — End: 2024-07-17
  Administered 2024-07-17: 10 mg via INTRAVENOUS

## 2024-07-17 MED ORDER — SODIUM BICARBONATE 1 MEQ/ML (8.4 %) INTRAVENOUS SOLUTION
INTRAVENOUS | Status: DC
Start: 2024-07-17 — End: 2024-07-17
  Filled 2024-07-17 (×2): qty 150

## 2024-07-17 MED ORDER — SODIUM CHLORIDE 0.9 % IV BOLUS
1000.0000 mL | INJECTION | Freq: Once | Status: AC
Start: 2024-07-17 — End: 2024-07-17
  Administered 2024-07-17: 1000 mL via INTRAVENOUS
  Administered 2024-07-17: 0 mL via INTRAVENOUS

## 2024-07-17 MED ORDER — VANCOMYCIN IV - INTERMITTENT DOSING
Freq: Every day | Status: DC | PRN
Start: 2024-07-17 — End: 2024-07-19

## 2024-07-17 MED ORDER — SODIUM CHLORIDE 0.9 % INTRAVENOUS SOLUTION
10.0000 mg | INTRAVENOUS | Status: AC
Start: 2024-07-18 — End: 2024-07-19
  Administered 2024-07-18: 0 mg via INTRAVENOUS
  Administered 2024-07-18: 10 mg via INTRAVENOUS
  Filled 2024-07-17: qty 1

## 2024-07-17 MED ORDER — WATER FOR INJECTION, STERILE INTRAVENOUS SOLUTION
INTRAVENOUS | Status: DC
Start: 2024-07-17 — End: 2024-07-17
  Filled 2024-07-17 (×2): qty 150

## 2024-07-17 MED ORDER — POTASSIUM CHLORIDE 20 MEQ/100ML IN STERILE WATER INTRAVENOUS PIGGYBACK
20.0000 meq | INJECTION | Freq: Once | INTRAVENOUS | Status: AC
Start: 2024-07-18 — End: 2024-07-18
  Administered 2024-07-18: 20 meq via INTRAVENOUS
  Administered 2024-07-18: 0 meq via INTRAVENOUS
  Filled 2024-07-17: qty 100

## 2024-07-17 MED ORDER — MIDAZOLAM 1 MG/ML INJECTION WRAPPER
INTRAMUSCULAR | Status: AC
Start: 2024-07-17 — End: 2024-07-17
  Administered 2024-07-17: 2 mg via INTRAVENOUS
  Filled 2024-07-17: qty 2

## 2024-07-17 MED ORDER — INSULIN LISPRO 100 UNIT/ML SUB-Q SSIP - CHARGE BY DOSE
1.0000 [IU] | INJECTION | SUBCUTANEOUS | Status: DC
Start: 2024-07-17 — End: 2024-07-19
  Administered 2024-07-17 (×2): 1 [IU] via SUBCUTANEOUS
  Administered 2024-07-17 (×2): 3 [IU] via SUBCUTANEOUS
  Administered 2024-07-17 – 2024-07-18 (×2): 1 [IU] via SUBCUTANEOUS
  Administered 2024-07-18: 0 [IU] via SUBCUTANEOUS
  Administered 2024-07-18 (×2): 1 [IU] via SUBCUTANEOUS
  Administered 2024-07-18 – 2024-07-19 (×4): 0 [IU] via SUBCUTANEOUS
  Administered 2024-07-19: 1 [IU] via SUBCUTANEOUS

## 2024-07-17 MED ORDER — VASOPRESSIN 20 UNIT/ML INTRAVENOUS SOLUTION
INTRAVENOUS | Status: AC
Start: 2024-07-17 — End: 2024-07-17
  Administered 2024-07-17: 0.03 [IU]/min via INTRAVENOUS
  Filled 2024-07-17: qty 1

## 2024-07-17 MED ORDER — CALCIUM CHLORIDE 100 MG/ML (10 %) INTRAVENOUS SYRINGE
INJECTION | Freq: Once | INTRAVENOUS | Status: AC | PRN
Start: 2024-07-17 — End: 2024-07-17
  Administered 2024-07-17: 1000 mg via INTRAVENOUS

## 2024-07-17 MED ORDER — FENTANYL (PF) 50 MCG/ML INJECTION SOLUTION
100.0000 ug | Freq: Once | INTRAMUSCULAR | Status: DC
Start: 2024-07-17 — End: 2024-07-19

## 2024-07-17 MED ORDER — SODIUM CHLORIDE 0.9 % INTRAVENOUS SOLUTION
1000.0000 mg | INTRAVENOUS | Status: AC
Start: 2024-07-17 — End: 2024-07-17
  Administered 2024-07-17: 1000 mg via INTRAVENOUS
  Administered 2024-07-17: 0 mg via INTRAVENOUS
  Filled 2024-07-17: qty 10

## 2024-07-17 MED ORDER — PANTOPRAZOLE 40 MG INTRAVENOUS SOLUTION
40.0000 mg | Freq: Two times a day (BID) | INTRAVENOUS | Status: DC
  Administered 2024-07-17: 40 mg via INTRAVENOUS
  Filled 2024-07-17: qty 10

## 2024-07-17 MED ORDER — SULFUR HEXAFLUORIDE MICROSPHERES 25 MG INTRAVENOUS SUSPENSION
2.5000 mL | INHALATION_SUSPENSION | INTRAVENOUS | Status: AC
Start: 2024-07-17 — End: 2024-07-17
  Administered 2024-07-17: 2.5 mL via INTRAVENOUS

## 2024-07-17 MED ORDER — SODIUM CHLORIDE 0.9 % INTRAVENOUS SOLUTION
1000.0000 mg | INTRAVENOUS | Status: AC
Start: 2024-07-17 — End: 2024-07-17
  Administered 2024-07-17: 0 mg via INTRAVENOUS
  Administered 2024-07-17: 1000 mg via INTRAVENOUS
  Filled 2024-07-17: qty 10

## 2024-07-17 MED ORDER — GLUCAGON 1 MG SOLUTION FOR INJECTION
1.0000 mg | Freq: Once | INTRAMUSCULAR | Status: DC | PRN
Start: 2024-07-17 — End: 2024-07-19

## 2024-07-17 MED ORDER — PIPERACILLIN-TAZOBACTAM 4.5 GRAM/100 ML DEXTROSE(ISO-OSM) IV PIGGYBACK
4.5000 g | INJECTION | Freq: Three times a day (TID) | INTRAVENOUS | Status: DC
Start: 2024-07-17 — End: 2024-07-19
  Administered 2024-07-17 (×2): 4.5 g via INTRAVENOUS
  Administered 2024-07-17 (×2): 0 g via INTRAVENOUS
  Administered 2024-07-17: 4.5 g via INTRAVENOUS
  Administered 2024-07-18 (×2): 0 g via INTRAVENOUS
  Administered 2024-07-18 (×3): 4.5 g via INTRAVENOUS
  Administered 2024-07-18 – 2024-07-19 (×3): 0 g via INTRAVENOUS
  Administered 2024-07-19: 4.5 g via INTRAVENOUS
  Filled 2024-07-17 (×7): qty 100

## 2024-07-17 MED ORDER — HYDROCORTISONE SOD SUCCINATE 100 MG/2 ML VIAL WRAPPER
INTRAMUSCULAR | Status: AC
Start: 2024-07-17 — End: 2024-07-17
  Administered 2024-07-17: 50 mg via INTRAVENOUS
  Filled 2024-07-17: qty 2

## 2024-07-17 MED ORDER — VASOPRESSIN 20 UNIT/ML INTRAVENOUS SOLUTION
0.0300 [IU]/min | INTRAVENOUS | Status: DC
Start: 2024-07-17 — End: 2024-07-19
  Administered 2024-07-17: 0.03 [IU]/min via INTRAVENOUS
  Administered 2024-07-17: 0 [IU]/min via INTRAVENOUS
  Administered 2024-07-17 (×3): 0.03 [IU]/min via INTRAVENOUS
  Administered 2024-07-17: 0 [IU]/min via INTRAVENOUS
  Administered 2024-07-17: 0.03 [IU]/min via INTRAVENOUS
  Administered 2024-07-17: 0 [IU]/min via INTRAVENOUS
  Administered 2024-07-17 – 2024-07-18 (×23): 0.03 [IU]/min via INTRAVENOUS
  Administered 2024-07-18: 0 [IU]/min via INTRAVENOUS
  Administered 2024-07-18 – 2024-07-19 (×19): 0.03 [IU]/min via INTRAVENOUS
  Administered 2024-07-19: 0 [IU]/min via INTRAVENOUS
  Administered 2024-07-19: 0.03 [IU]/min via INTRAVENOUS
  Filled 2024-07-17 (×4): qty 1

## 2024-07-17 MED ORDER — PIPERACILLIN-TAZOBACTAM 4.5 GRAM/100 ML DEXTROSE(ISO-OSM) IV PIGGYBACK
4.5000 g | INJECTION | INTRAVENOUS | Status: AC
Start: 2024-07-17 — End: 2024-07-17
  Administered 2024-07-17: 4.5 g via INTRAVENOUS
  Administered 2024-07-17: 0 g via INTRAVENOUS
  Filled 2024-07-17: qty 100

## 2024-07-17 MED ORDER — SODIUM CHLORIDE 0.9 % INTRAVENOUS SOLUTION
0.0000 ug/kg/min | INTRAVENOUS | Status: DC
Start: 2024-07-17 — End: 2024-07-19
  Administered 2024-07-17: 1 ug/kg/min via INTRAVENOUS
  Administered 2024-07-17: 1.5 ug/kg/min via INTRAVENOUS
  Administered 2024-07-17: 1 ug/kg/min via INTRAVENOUS
  Administered 2024-07-17: 4 ug/kg/min via INTRAVENOUS
  Administered 2024-07-17: 0.5 ug/kg/min via INTRAVENOUS
  Administered 2024-07-17: 1 ug/kg/min via INTRAVENOUS
  Administered 2024-07-17: 1.5 ug/kg/min via INTRAVENOUS
  Administered 2024-07-17: 0.5 ug/kg/min via INTRAVENOUS
  Administered 2024-07-17: 3 ug/kg/min via INTRAVENOUS
  Administered 2024-07-17: 4 ug/kg/min via INTRAVENOUS
  Administered 2024-07-17: 0.5 ug/kg/min via INTRAVENOUS
  Administered 2024-07-17: 3.5 ug/kg/min via INTRAVENOUS
  Administered 2024-07-17: 4 ug/kg/min via INTRAVENOUS
  Administered 2024-07-17: 2.5 ug/kg/min via INTRAVENOUS
  Administered 2024-07-17: 0 ug/kg/min via INTRAVENOUS
  Administered 2024-07-17: 2 ug/kg/min via INTRAVENOUS
  Administered 2024-07-17: 4 ug/kg/min via INTRAVENOUS
  Administered 2024-07-17: 1.5 ug/kg/min via INTRAVENOUS
  Administered 2024-07-17: 1 ug/kg/min via INTRAVENOUS
  Administered 2024-07-18: 1.5 ug/kg/min via INTRAVENOUS
  Administered 2024-07-18 (×3): 0.5 ug/kg/min via INTRAVENOUS
  Administered 2024-07-18 (×2): 1 ug/kg/min via INTRAVENOUS
  Administered 2024-07-18: 0.5 ug/kg/min via INTRAVENOUS
  Administered 2024-07-18: 1 ug/kg/min via INTRAVENOUS
  Administered 2024-07-18 (×3): 0.5 ug/kg/min via INTRAVENOUS
  Administered 2024-07-18 (×3): 1 ug/kg/min via INTRAVENOUS
  Administered 2024-07-18 (×2): 0 ug/kg/min via INTRAVENOUS
  Administered 2024-07-18: 0.5 ug/kg/min via INTRAVENOUS
  Administered 2024-07-19: 1 ug/kg/min via INTRAVENOUS
  Administered 2024-07-19: 5 ug/kg/min via INTRAVENOUS
  Administered 2024-07-19: 1.5 ug/kg/min via INTRAVENOUS
  Administered 2024-07-19 (×2): 1 ug/kg/min via INTRAVENOUS
  Administered 2024-07-19: 3 ug/kg/min via INTRAVENOUS
  Administered 2024-07-19: 2.5 ug/kg/min via INTRAVENOUS
  Administered 2024-07-19: 2 ug/kg/min via INTRAVENOUS
  Administered 2024-07-19: 0 ug/kg/min via INTRAVENOUS
  Administered 2024-07-19: 1 ug/kg/min via INTRAVENOUS
  Administered 2024-07-19: 4 ug/kg/min via INTRAVENOUS
  Administered 2024-07-19 (×2): 1 ug/kg/min via INTRAVENOUS
  Filled 2024-07-17 (×19): qty 10

## 2024-07-17 MED ORDER — DEXMEDETOMIDINE 400 MCG/100 ML (4 MCG/ML) IN DEXTROSE 5 % IV SOLUTION
INTRAVENOUS | Status: AC
Start: 2024-07-17 — End: 2024-07-17
  Administered 2024-07-17: 1.5 ug/kg/h via INTRAVENOUS
  Filled 2024-07-17: qty 100

## 2024-07-17 MED ORDER — SODIUM CHLORIDE 0.9 % IV BOLUS
40.0000 mL | INJECTION | Freq: Once | Status: AC | PRN
Start: 2024-07-17 — End: 2024-07-17

## 2024-07-17 MED ORDER — DEXTROSE 50 % IN WATER (D50W) INTRAVENOUS SYRINGE
INJECTION | Freq: Once | INTRAVENOUS | Status: AC | PRN
Start: 2024-07-17 — End: 2024-07-17
  Administered 2024-07-17: 25 g via INTRAVENOUS

## 2024-07-17 MED ORDER — FENTANYL (PF) 50 MCG/ML INTRAVENOUS SOLUTION
INTRAVENOUS | Status: AC
Start: 2024-07-17 — End: 2024-07-17
  Administered 2024-07-17: 3 ug/kg/h via INTRAVENOUS
  Filled 2024-07-17: qty 50

## 2024-07-17 MED ORDER — KETAMINE 10 MG/ML INJECTION WRAPPER
Freq: Once | INTRAMUSCULAR | Status: AC | PRN
Start: 2024-07-17 — End: 2024-07-17
  Administered 2024-07-17: 25 mg via INTRAVENOUS

## 2024-07-17 MED ORDER — VANCOMYCIN 1 GRAM/200 ML IN DEXTROSE 5 % INTRAVENOUS PIGGYBACK
20.0000 mg/kg | INJECTION | Freq: Once | INTRAVENOUS | Status: AC
Start: 2024-07-17 — End: 2024-07-17
  Administered 2024-07-17: 0 mg via INTRAVENOUS
  Administered 2024-07-17: 1000 mg via INTRAVENOUS
  Filled 2024-07-17: qty 200

## 2024-07-17 MED ORDER — MAGNESIUM SULFATE 2 GRAM/50 ML (4 %) IN WATER INTRAVENOUS PIGGYBACK
2.0000 g | INJECTION | Freq: Once | INTRAVENOUS | Status: AC
Start: 2024-07-18 — End: 2024-07-18
  Administered 2024-07-18: 0 g via INTRAVENOUS
  Administered 2024-07-18: 2 g via INTRAVENOUS
  Filled 2024-07-17: qty 50

## 2024-07-17 MED ORDER — SODIUM CHLORIDE 0.9 % INTRAVENOUS SOLUTION
0.0000 ug/kg/min | INTRAVENOUS | Status: DC
Start: 2024-07-17 — End: 2024-07-19
  Administered 2024-07-17: 0.3 ug/kg/min via INTRAVENOUS
  Administered 2024-07-17: 0 ug/kg/min via INTRAVENOUS
  Administered 2024-07-17 – 2024-07-18 (×8): 0.2 ug/kg/min via INTRAVENOUS
  Administered 2024-07-18 (×2): 0 ug/kg/min via INTRAVENOUS
  Administered 2024-07-18 (×4): 0.2 ug/kg/min via INTRAVENOUS
  Administered 2024-07-18: 0 ug/kg/min via INTRAVENOUS
  Administered 2024-07-18: 0.2 ug/kg/min via INTRAVENOUS

## 2024-07-17 MED ORDER — FUROSEMIDE 10 MG/ML INJECTION SOLUTION
60.0000 mg | Freq: Once | INTRAMUSCULAR | Status: AC
Start: 2024-07-17 — End: 2024-07-17
  Administered 2024-07-17: 60 mg via INTRAVENOUS
  Filled 2024-07-17: qty 6

## 2024-07-17 MED ORDER — DEXTROSE 40 % ORAL GEL
15.0000 g | ORAL | Status: DC | PRN
Start: 2024-07-17 — End: 2024-07-19
  Filled 2024-07-17: qty 15

## 2024-07-17 MED ORDER — PANTOPRAZOLE 40 MG INTRAVENOUS SOLUTION: 40.0000 mg | Freq: Every day | INTRAVENOUS | Status: DC

## 2024-07-17 MED ORDER — SODIUM CHLORIDE 0.9 % IV BOLUS
1000.0000 mL | INJECTION | Status: AC
Start: 2024-07-17 — End: 2024-07-17
  Administered 2024-07-17: 1000 mL via INTRAVENOUS
  Administered 2024-07-17: 0 mL via INTRAVENOUS

## 2024-07-17 MED ORDER — NOREPINEPHRINE BITARTRATE 4 MG/250 ML (16 MCG/ML) IN DEXTROSE 5 % IV
0.0000 ug/kg/min | INTRAVENOUS | Status: DC
Start: 2024-07-17 — End: 2024-07-19
  Administered 2024-07-17 (×2): 0 ug/kg/min via INTRAVENOUS
  Administered 2024-07-17: 0.1 ug/kg/min via INTRAVENOUS
  Administered 2024-07-17: 0.05 ug/kg/min via INTRAVENOUS
  Administered 2024-07-17 (×3): 0.03 ug/kg/min via INTRAVENOUS
  Administered 2024-07-17: 0.01 ug/kg/min via INTRAVENOUS
  Administered 2024-07-17: 0.03 ug/kg/min via INTRAVENOUS
  Administered 2024-07-17: 0.01 ug/kg/min via INTRAVENOUS
  Administered 2024-07-17: 0.1 ug/kg/min via INTRAVENOUS
  Administered 2024-07-17 – 2024-07-18 (×3): 0.03 ug/kg/min via INTRAVENOUS
  Administered 2024-07-18 (×2): 0.05 ug/kg/min via INTRAVENOUS
  Administered 2024-07-18: 0.03 ug/kg/min via INTRAVENOUS
  Administered 2024-07-18: 0.05 ug/kg/min via INTRAVENOUS
  Administered 2024-07-18 (×3): 0.03 ug/kg/min via INTRAVENOUS
  Administered 2024-07-18: 0.05 ug/kg/min via INTRAVENOUS
  Administered 2024-07-18: 0.03 ug/kg/min via INTRAVENOUS
  Administered 2024-07-18 (×3): 0.05 ug/kg/min via INTRAVENOUS
  Administered 2024-07-18: 0.03 ug/kg/min via INTRAVENOUS
  Administered 2024-07-18 (×2): 0.05 ug/kg/min via INTRAVENOUS
  Administered 2024-07-18: 0.03 ug/kg/min via INTRAVENOUS
  Administered 2024-07-19: 0 ug/kg/min via INTRAVENOUS
  Filled 2024-07-17: qty 250

## 2024-07-17 MED ORDER — VANCOMYCIN IV - PHARMACIST TO DOSE PER PROTOCOL
Freq: Every day | Status: DC | PRN
Start: 2024-07-17 — End: 2024-07-19

## 2024-07-17 MED ORDER — PANTOPRAZOLE 40 MG INTRAVENOUS SOLUTION
40.0000 mg | Freq: Two times a day (BID) | INTRAVENOUS | Status: DC
  Administered 2024-07-17 – 2024-07-19 (×4): 40 mg via INTRAVENOUS
  Filled 2024-07-17 (×4): qty 10

## 2024-07-17 MED ORDER — NOREPINEPHRINE BITARTRATE 4 MG/250 ML (16 MCG/ML) IN DEXTROSE 5 % IV
INTRAVENOUS | Status: AC
Start: 2024-07-17 — End: 2024-07-18
  Administered 2024-07-17: 0.07 ug/kg/min via INTRAVENOUS
  Filled 2024-07-17: qty 250

## 2024-07-17 MED ORDER — DEXMEDETOMIDINE 400 MCG/100 ML (4 MCG/ML) IN DEXTROSE 5 % IV SOLUTION
0.0000 ug/kg/h | INTRAVENOUS | Status: DC
Start: 2024-07-17 — End: 2024-07-19
  Administered 2024-07-17 (×13): 1.5 ug/kg/h via INTRAVENOUS
  Administered 2024-07-17: 1 ug/kg/h via INTRAVENOUS
  Administered 2024-07-17 (×4): 1.5 ug/kg/h via INTRAVENOUS
  Administered 2024-07-17: 1 ug/kg/h via INTRAVENOUS
  Administered 2024-07-17 (×3): 1.5 ug/kg/h via INTRAVENOUS
  Administered 2024-07-17: 1 ug/kg/h via INTRAVENOUS
  Administered 2024-07-17 – 2024-07-18 (×8): 1.5 ug/kg/h via INTRAVENOUS
  Administered 2024-07-18: 0 ug/kg/h via INTRAVENOUS
  Administered 2024-07-18 (×5): 1.5 ug/kg/h via INTRAVENOUS
  Filled 2024-07-17 (×6): qty 100

## 2024-07-17 MED ORDER — FENTANYL (PF) 50 MCG/ML INJECTION SOLUTION
50.0000 ug | INTRAMUSCULAR | Status: DC | PRN
Start: 2024-07-17 — End: 2024-07-19

## 2024-07-17 MED ORDER — SODIUM BICARBONATE 8.4 % (1 MEQ/ML) INTRAVENOUS SYRINGE
INJECTION | INTRAVENOUS | Status: AC
Start: 2024-07-17 — End: 2024-07-17
  Filled 2024-07-17: qty 300

## 2024-07-17 MED ORDER — SODIUM CHLORIDE 0.9 % INTRAVENOUS SOLUTION
INTRAVENOUS | Status: AC
Start: 2024-07-17 — End: 2024-07-17
  Administered 2024-07-17: 0.2 ug/kg/min via INTRAVENOUS
  Filled 2024-07-17: qty 10

## 2024-07-17 MED ORDER — HYDROCORTISONE SOD SUCCINATE 100 MG/2 ML VIAL WRAPPER
50.0000 mg | Freq: Four times a day (QID) | INTRAMUSCULAR | Status: DC
Start: 2024-07-17 — End: 2024-07-19
  Administered 2024-07-17 – 2024-07-19 (×10): 50 mg via INTRAVENOUS
  Filled 2024-07-17 (×9): qty 2

## 2024-07-17 NOTE — Care Plan (Signed)
 Problem: Wound  Goal: Optimal Coping  Outcome: Ongoing (see interventions/notes)  Goal: Optimal Functional Ability  Outcome: Ongoing (see interventions/notes)  Intervention: Optimize Functional Ability  Recent Flowsheet Documentation  Taken 07/17/2024 1500 by Delon NOVAK, RN  Activity Management: bedrest  Taken 07/17/2024 1100 by Delon NOVAK, RN  Activity Management: bedrest  Taken 07/17/2024 0700 by Delon NOVAK, RN  Activity Management: bedrest  Goal: Absence of Infection Signs and Symptoms  Outcome: Ongoing (see interventions/notes)  Intervention: Prevent or Manage Infection  Recent Flowsheet Documentation  Taken 07/17/2024 0700 by Delon NOVAK, RN  Isolation Precautions: droplet precautions maintained  Goal: Improved Oral Intake  Outcome: Ongoing (see interventions/notes)  Goal: Optimal Pain Control and Function  Outcome: Ongoing (see interventions/notes)  Goal: Skin Health and Integrity  Outcome: Ongoing (see interventions/notes)  Intervention: Optimize Skin Protection  Recent Flowsheet Documentation  Taken 07/17/2024 1500 by Delon NOVAK, RN  Pressure Reduction Techniques:   Heels elevated off of the bed   Patient turned q 2 hours   Mobility is maximized   Moisture, shear and nutrition are maximized   30 degree lateral position utilized   Supplemented with small shifts   Frequent weight shifting encouraged   Pressure points protected  Pressure Reduction Devices:   Heel offloading device utilized   Sacral dressing utilized if not incontinent of bowel  Activity Management: bedrest  Taken 07/17/2024 1100 by Delon NOVAK, RN  Pressure Reduction Techniques:   Heels elevated off of the bed   Patient turned q 2 hours   Mobility is maximized   Moisture, shear and nutrition are maximized   30 degree lateral position utilized   Supplemented with small shifts   Frequent weight shifting encouraged   Pressure points protected  Pressure Reduction Devices:   Heel offloading device utilized   Sacral dressing utilized if not incontinent of  bowel  Activity Management: bedrest  Taken 07/17/2024 0700 by Delon NOVAK, RN  Pressure Reduction Techniques:   Heels elevated off of the bed   Patient turned q 2 hours   Mobility is maximized   Moisture, shear and nutrition are maximized   30 degree lateral position utilized   Supplemented with small shifts   Frequent weight shifting encouraged   Pressure points protected  Pressure Reduction Devices:   Heel offloading device utilized   Sacral dressing utilized if not incontinent of bowel  Activity Management: bedrest  Goal: Optimal Wound Healing  Outcome: Ongoing (see interventions/notes)  Intervention: Promote Wound Healing  Recent Flowsheet Documentation  Taken 07/17/2024 1500 by Delon NOVAK, RN  Pressure Reduction Techniques:   Heels elevated off of the bed   Patient turned q 2 hours   Mobility is maximized   Moisture, shear and nutrition are maximized   30 degree lateral position utilized   Supplemented with small shifts   Frequent weight shifting encouraged   Pressure points protected  Pressure Reduction Devices:   Heel offloading device utilized   Sacral dressing utilized if not incontinent of bowel  Activity Management: bedrest  Taken 07/17/2024 1100 by Delon NOVAK, RN  Pressure Reduction Techniques:   Heels elevated off of the bed   Patient turned q 2 hours   Mobility is maximized   Moisture, shear and nutrition are maximized   30 degree lateral position utilized   Supplemented with small shifts   Frequent weight shifting encouraged   Pressure points protected  Pressure Reduction Devices:   Heel offloading device utilized   Sacral dressing utilized if not incontinent of  bowel  Activity Management: bedrest  Taken 07/17/2024 0700 by Delon NOVAK, RN  Pressure Reduction Techniques:   Heels elevated off of the bed   Patient turned q 2 hours   Mobility is maximized   Moisture, shear and nutrition are maximized   30 degree lateral position utilized   Supplemented with small shifts   Frequent weight shifting encouraged    Pressure points protected  Pressure Reduction Devices:   Heel offloading device utilized   Sacral dressing utilized if not incontinent of bowel  Activity Management: bedrest

## 2024-07-17 NOTE — Consults (Signed)
 [facility] Medicine [facility]  Hematology/Oncology    Progress Note      [name], [name], 68 y.o. 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  S/p fall which warranted CT scan of head, revealing subdural hemorrhage overlying left frontal lobe anteriorly and superiorly.  Noted mentation status change as well.  Neurosurgery and Neurology have been consulted.  Received 1 unit of PRBC on 07/16/2024 for hemoglobin of 6.5.  Hemoglobin remained low at 7.1.  Platelet count continues to stay low at 42,000 today.  INR continues to stay elevated but has decreased from earlier today.  Has continued to receive FFP while in the ICU.  She was intubated and sedated earlier this morning due to decompensation.    Review of Systems:   Review of Systems   Unable to perform ROS: Intubated     Past Medical History:   Diagnosis Date    Abdominal pain     Cancer (CMS HCC)     Chest pain     Chronic lung disease     Coronary artery disease     Dyslipidemia     Essential hypertension     Heart disease     History of percutaneous coronary intervention     History of stress test     Mixed hyperlipidemia     Unspecified chronic bronchitis      Past Surgical History:   Procedure Laterality Date    CARDIAC CATHETERIZATION      CORONARY ANGIOPLASTY      CORONARY ARTERY STENT PLACEMENT      HX BACK SURGERY      HX CORONARY ARTERY BYPASS GRAFT      HX MASTECTOMY, SIMPLE Bilateral     HX TUBAL LIGATION      NECK SURGERY       Social History     Socioeconomic History    Marital status: Widowed     Spouse name: Not on file    Number of children: Not on file    Years of education: Not on file    Highest education level: Not on file   Occupational History    Not on file   Tobacco Use    Smoking status: Every Day     Current packs/day: 0.50      Average packs/day: 0.5 packs/day for 52.8 years (26.4 ttl pk-yrs)     Types: Cigarettes     Start date: 22    Smokeless tobacco: Never   Vaping Use    Vaping status: Never Used   Substance and Sexual Activity    Alcohol use: Yes     Comment: occasional    Drug use: Yes     Types: Marijuana    Sexual activity: Not on file   Other Topics Concern    Not on file   Social History Narrative    Not on file     Social Determinants of Health     Financial Resource Strain: Not on file   Transportation Needs: Not on file   Social Connections: Low Risk (07/09/2024)    Social Connections     SDOH Social Isolation: 5 or more times a week   Intimate Partner Violence: Not on file   Housing Stability: Not on  file        Family Medical History:       Problem Relation (Age of Onset)    Arthritis Mother, Maternal Grandmother    Cancer Father, Other    Heart Attack Father, Other    Heart Disease Brother, Other    Hypertension (High Blood Pressure) Mother, Father, Other    Pacemaker Mother          Vital Signs:  Temp  Avg: 36.8 C (98.3 F)  Min: 36.4 C (97.6 F)  Max: 37 C (98.6 F)    Pulse  Avg: 90.1  Min: 71  Max: 138 BP  Min: 81/54  Max: 148/78   Resp  Avg: 15.9  Min: 0  Max: 28 SpO2  Avg: 96.7 %  Min: 72 %  Max: 100 %          Input/Output    Intake/Output Summary (Last 24 hours) at 07/17/2024 1457  Last data filed at 07/17/2024 1404  Gross per 24 hour   Intake 8779.63 ml   Output 640 ml   Net 8139.63 ml    I/O last shift:  10/08 0700 - 10/08 1859  In: 1874.64 [I.V.:948.42; Blood:609.22]  Out: 100 [Urine:100]     LABS:   Results for orders placed or performed during the hospital encounter of 07/08/24 (from the past 24 hours)   PLATELET COUNT   Result Value Ref Range    PLATELETS 46 (L) 150 - 400 x10^3/uL    MPV     OCCULT BLOOD, STOOL   Result Value Ref Range    OCCULT BLOOD Positive (A) Negative   BLOOD GAS W/ CO-OX, LYTES, LACTATE REFLEX Arterial   Result Value Ref Range    %FIO2 (ARTERIAL) 100 %    PH (ARTERIAL) 7.21 (L) 7.35 -  7.45    PCO2 (ARTERIAL) 20 (L) 35 - 45 mm/Hg    PO2 (ARTERIAL) 129 (H) 83 - 108 mm/Hg    BICARBONATE (ARTERIAL) 10.8 (L) 21.0 - 28.0 mmol/L    BASE DEFICIT 18.2 (H) 0.0 - 3.0 mmol/L    PAO2/FIO2 RATIO 129     O2 SATURATION (ARTERIAL) 98.2 94.0 - 98.0 %    HEMOGLOBIN 6.8 (LL) 12.0 - 18.0 g/dL    HEMATOCRITRT 20 (L) 37 - 50 %    OXYHEMOGLOBIN 97.4 (H) 90.0 - 95.0 %    CARBOXYHEMOGLOBIN 1.8 <=3.0 %    MET-HEMOGLOBIN 0.8 <=1.5 %    O2CT 9.6 %    SODIUM 147 (H) 136 - 145 mmol/L    WHOLE BLOOD POTASSIUM 5.2 (H) 3.5 - 5.1 mmol/L    CHLORIDE 104 98 - 107 mmol/L    IONIZED CALCIUM 1.09 (L) 1.15 - 1.33 mmol/L    GLUCOSE 33 (LL) 65 - 125 mg/dL    LACTATE >82.9 (HH) <=8.0 mmol/L   LACTIC ACID LEVEL W/ REFLEX FOR LEVEL >2.0   Result Value Ref Range    LACTIC ACID 19.2 (HH) 0.5 - 2.0 mmol/L   URINALYSIS, MACRO/MICRO   Result Value Ref Range    COLOR Dark Yellow Yellow, Dark Yellow, Light Yellow    APPEARANCE Cloudy (A) Clear, Slightly Cloudy    PH 5.0 5.0 - 7.0    SPECIFIC GRAVITY 1.018 1.005 - 1.025    PROTEIN 2+ (A) Negative mg/dL    NITRITE Negative Negative    GLUCOSE Negative Negative mg/dL    KETONES Trace (A) Negative mg/dL    BLOOD 2+ (A) Negative, Non-Hemolyzed Trace mg/dL  BILIRUBIN 2+ (A) Negative mg/dL    UROBILINOGEN 1.0 0.2 , 1.0 mg/dL    LEUKOCYTES Negative Negative WBCs/uL    RBCS 12.5 (H) <=3.0 /hpf    WBCS 2.3 <=4.0 /hpf    HYALINE CASTS Present (A) (none) /lpf    SQUAMOUS EPITHELIAL CELLS 3.5 0.0 - 10.0 /hpf    TOTAL CASTS 20.6 (H) <=2.0 /lpf    BACTERIA 153.2 (H) <=0 /hpf   C-REACTIVE PROTEIN (CRP)   Result Value Ref Range    C-REACTIVE PROTEIN (CRP) 8.5 (H) 0.0 - 0.9 mg/dL   CBC   Result Value Ref Range    WBC 12.3 (H) 3.7 - 11.0 x10^3/uL    RBC 2.15 (L) 3.85 - 5.22 x10^6/uL    HGB 6.0 (LL) 11.5 - 16.0 g/dL    HCT 79.5 (L) 65.1 - 46.0 %    MCV 94.9 78.0 - 100.0 fL    MCH 27.9 26.0 - 32.0 pg    MCHC 29.4 (L) 31.0 - 35.5 g/dL    RDW-CV 83.4 (H) 88.4 - 15.5 %    PLATELETS 46 (L) 150 - 400 x10^3/uL    MPV 11.0  8.7 - 12.5 fL   COMPREHENSIVE METABOLIC PANEL, NON-FASTING   Result Value Ref Range    SODIUM 145 (H) 133 - 144 mmol/L    POTASSIUM 4.7 3.2 - 5.0 mmol/L    CHLORIDE 103 96 - 106 mmol/L    CO2 TOTAL 9 (LL) 22 - 30 mmol/L    ANION GAP 33 (H) 7 - 18 mmol/L    BUN 63 (H) 8 - 23 mg/dL    CREATININE 8.31 (H) 0.50 - 0.90 mg/dL    ESTIMATED GFR 33 (L) >90 mL/min/1.60m^2    ALBUMIN 2.7 (L) 3.5 - 5.2 g/dL    CALCIUM 8.6 8.3 - 89.2 mg/dL    GLUCOSE 41 (LL) 74 - 109 mg/dL    ALKALINE PHOSPHATASE 437 (H) 35 - 129 U/L    ALT (SGPT) 542 (H) 0 - 33 U/L    AST (SGOT) 1,060 (H) 0 - 32 U/L    BILIRUBIN TOTAL 5.9 (H) 0.2 - 1.2 mg/dL    PROTEIN TOTAL 4.8 (L) 6.4 - 8.3 g/dL   IONIZED CALCIUM WITH PH   Result Value Ref Range    PH (VENOUS) 7.08 (LL) 7.32 - 7.43    IONIZED CALCIUM 1.09 (L) 1.15 - 1.33 mmol/L   MAGNESIUM    Result Value Ref Range    MAGNESIUM  2.5 (H) 1.6 - 2.4 mg/dL   NT-PROBNP   Result Value Ref Range    NT-PROBNP 5,866 (H) 0 - 125 pg/mL   PHOSPHORUS   Result Value Ref Range    PHOSPHORUS 6.6 (H) 2.5 - 4.5 mg/dL   PROCALCITONIN   Result Value Ref Range    PROCALCITONIN  79.80 (H) 0.00 - 0.50 ng/mL   PT/INR   Result Value Ref Range    PROTHROMBIN TIME 27.1 (H) 12.1 - 15.3 seconds    INR 2.52 (H) 0.86 - 1.14   PTT (PARTIAL THROMBOPLASTIN TIME)   Result Value Ref Range    APTT 37.9 (H) 22.4 - 34.8 seconds   URIC ACID   Result Value Ref Range    URIC ACID 15.5 (HH) 2.4 - 5.7 mg/dL   STREP PNEUMONIAE AND LEGIONELLA ANTIGEN, URINE    Specimen: Urine, Site not specified   Result Value Ref Range    S.PNEUMONIAE ANTIGEN Negative Negative, Indeterminate    LEGIONELLA ANTIGEN Negative Negative, Indeterminate  AMMONIA   Result Value Ref Range    AMMONIA 113 (HH) 11 - 51 umol/L   RESPIRATORY CULTURE AND GRAM STAIN (PERFORMABLE)    Specimen: Sputum   Result Value Ref Range    GRAM STAIN 4+ Many WBCs     GRAM STAIN 1+ Rare Yeast    CORTISOL, PLASMA OR SERUM   Result Value Ref Range    CORTISOL 76.4 ug/dL   FERRITIN   Result Value Ref  Range    FERRITIN >2,000 (H) 13 - 150 ng/mL   TRIGLYCERIDE   Result Value Ref Range    TRIGLYCERIDES 103 0 - 150 mg/dL   LDH   Result Value Ref Range    LDH >2,500 (H) 135 - 214 U/L   POC BLOOD GLUCOSE (RESULTS)   Result Value Ref Range    GLUCOSE, POC 153 (H) 70 - 100 mg/dl   PRODUCT: FFP/PLASMA - UNITS : 2 Units   Result Value Ref Range    Coding System ISBT128     UNIT NUMBER [id number]     BLOOD COMPONENT TYPE THAWED PLASMA     UNIT DIVISION 00     UNIT DISPENSE STATUS ISSUED     TRANSFUSION STATUS OK TO TRANSFUSE     Product Code Z7315C99     Coding System ISBT128     UNIT NUMBER [id number]     BLOOD COMPONENT TYPE THAWED PLASMA     UNIT DIVISION 00     UNIT DISPENSE STATUS ISSUED     TRANSFUSION STATUS OK TO TRANSFUSE     Product Code Z4451C99    LACTIC ACID - FIRST REFLEX   Result Value Ref Range    LACTIC ACID 15.2 (HH) 0.5 - 2.0 mmol/L   CBC   Result Value Ref Range    WBC 11.3 (H) 3.7 - 11.0 x10^3/uL    RBC 2.28 (L) 3.85 - 5.22 x10^6/uL    HGB 6.6 (LL) 11.5 - 16.0 g/dL    HCT 78.4 (L) 65.1 - 46.0 %    MCV 94.3 78.0 - 100.0 fL    MCH 28.9 26.0 - 32.0 pg    MCHC 30.7 (L) 31.0 - 35.5 g/dL    RDW-CV 83.0 (H) 88.4 - 15.5 %    PLATELETS 45 (L) 150 - 400 x10^3/uL    MPV 11.1 8.7 - 12.5 fL   COMPREHENSIVE METABOLIC PANEL, NON-FASTING   Result Value Ref Range    SODIUM 149 (H) 133 - 144 mmol/L    POTASSIUM 3.8 3.2 - 5.0 mmol/L    CHLORIDE 103 96 - 106 mmol/L    CO2 TOTAL 14 (L) 22 - 30 mmol/L    ANION GAP 32 (H) 7 - 18 mmol/L    BUN 63 (H) 8 - 23 mg/dL    CREATININE 8.43 (H) 0.50 - 0.90 mg/dL    ESTIMATED GFR 36 (L) >90 mL/min/1.52m^2    ALBUMIN 2.6 (L) 3.5 - 5.2 g/dL    CALCIUM 9.1 8.3 - 89.2 mg/dL    GLUCOSE 807 (H) 74 - 109 mg/dL    ALKALINE PHOSPHATASE 370 (H) 35 - 129 U/L    ALT (SGPT) 599 (H) 0 - 33 U/L    AST (SGOT) 1,184 (H) 0 - 32 U/L    BILIRUBIN TOTAL 5.2 (H) 0.2 - 1.2 mg/dL    PROTEIN TOTAL 4.5 (L) 6.4 - 8.3 g/dL   IONIZED CALCIUM WITH PH   Result Value Ref Range    PH (VENOUS) 7.29 (L) 7.32 -  7.43    IONIZED CALCIUM 1.12 (L) 1.15 - 1.33 mmol/L   MAGNESIUM    Result Value Ref Range    MAGNESIUM  2.1 1.6 - 2.4 mg/dL   PHOSPHORUS   Result Value Ref Range    PHOSPHORUS 6.7 (H) 2.5 - 4.5 mg/dL   PT/INR   Result Value Ref Range    PROTHROMBIN TIME 24.4 (H) 12.1 - 15.3 seconds    INR 2.19 (H) 0.86 - 1.14   PTT (PARTIAL THROMBOPLASTIN TIME)   Result Value Ref Range    APTT 35.7 (H) 22.4 - 34.8 seconds   BLOOD GAS W/ CO-OX, LYTES, LACTATE REFLEX Arterial   Result Value Ref Range    %FIO2 (ARTERIAL) 100 %    PH (ARTERIAL) 7.24 (L) 7.35 - 7.45    PCO2 (ARTERIAL) 35 35 - 45 mm/Hg    PO2 (ARTERIAL) 453 (H) 83 - 108 mm/Hg    BICARBONATE (ARTERIAL) 16.1 (L) 21.0 - 28.0 mmol/L    BASE DEFICIT 11.4 (H) 0.0 - 3.0 mmol/L    PAO2/FIO2 RATIO 453     O2 SATURATION (ARTERIAL) 100.0 94.0 - 98.0 %    HEMOGLOBIN 9.3 (L) 12.0 - 18.0 g/dL    HEMATOCRITRT 28 (L) 37 - 50 %    OXYHEMOGLOBIN 98.3 (H) 90.0 - 95.0 %    CARBOXYHEMOGLOBIN 1.7 <=3.0 %    MET-HEMOGLOBIN <0.7 <=1.5 %    O2CT 14.1 %    SODIUM 144 136 - 145 mmol/L    WHOLE BLOOD POTASSIUM 3.7 3.5 - 5.1 mmol/L    CHLORIDE 104 98 - 107 mmol/L    IONIZED CALCIUM 1.14 (L) 1.15 - 1.33 mmol/L    GLUCOSE 172 (H) 65 - 125 mg/dL    LACTATE 85.1 (HH) <=8.0 mmol/L   POC BLOOD GLUCOSE (RESULTS)   Result Value Ref Range    GLUCOSE, POC 220 (H) 70 - 100 mg/dl   PRODUCT: FFP/PLASMA - UNITS : 2 Units   Result Value Ref Range    Coding System ISBT128     UNIT NUMBER [id number]     BLOOD COMPONENT TYPE THAWED PLASMA     UNIT DIVISION 00     UNIT DISPENSE STATUS ISSUED     TRANSFUSION STATUS OK TO TRANSFUSE     Product Code E2684V00     Coding System ISBT128     UNIT NUMBER [id number]     BLOOD COMPONENT TYPE THAWED PLASMA     UNIT DIVISION 00     UNIT DISPENSE STATUS ISSUED     TRANSFUSION STATUS OK TO TRANSFUSE     Product Code Z7315C99    PRODUCT: PLATELETS - UNITS , 1 Units   Result Value Ref Range    Coding System ISBT128     UNIT NUMBER [id number]     BLOOD COMPONENT TYPE PATHOGEN  REDUCED PLASMA REDUCED LOW VOLUME2     UNIT DIVISION 00     UNIT DISPENSE STATUS ISSUED     TRANSFUSION STATUS OK TO TRANSFUSE     Product Code Z0860C99    POC BLOOD GLUCOSE (RESULTS)   Result Value Ref Range    GLUCOSE, POC 289 (H) 70 - 100 mg/dl   LACTIC ACID - SECOND REFLEX   Result Value Ref Range    LACTIC ACID 7.1 (HH) 0.5 - 2.0 mmol/L   CBC   Result Value Ref Range    WBC 6.7 3.7 - 11.0 x10^3/uL    RBC 2.88 (L) 3.85 - 5.22 x10^6/uL  HGB 8.3 (L) 11.5 - 16.0 g/dL    HCT 74.3 (L) 65.1 - 46.0 %    MCV 88.9 78.0 - 100.0 fL    MCH 28.8 26.0 - 32.0 pg    MCHC 32.4 31.0 - 35.5 g/dL    RDW-CV 85.1 88.4 - 84.4 %    PLATELETS 58 (L) 150 - 400 x10^3/uL    MPV 10.9 8.7 - 12.5 fL   COMPREHENSIVE METABOLIC PANEL, NON-FASTING   Result Value Ref Range    SODIUM 149 (H) 133 - 144 mmol/L    POTASSIUM 3.4 3.2 - 5.0 mmol/L    CHLORIDE 101 96 - 106 mmol/L    CO2 TOTAL 24 22 - 30 mmol/L    ANION GAP 24 (H) 7 - 18 mmol/L    BUN 59 (H) 8 - 23 mg/dL    CREATININE 8.49 (H) 0.50 - 0.90 mg/dL    ESTIMATED GFR 38 (L) >90 mL/min/1.32m^2    ALBUMIN 3.1 (L) 3.5 - 5.2 g/dL    CALCIUM 9.3 8.3 - 89.2 mg/dL    GLUCOSE 680 (H) 74 - 109 mg/dL    ALKALINE PHOSPHATASE 302 (H) 35 - 129 U/L    ALT (SGPT) 743 (H) 0 - 33 U/L    AST (SGOT) 1,532 (H) 0 - 32 U/L    BILIRUBIN TOTAL 5.0 (H) 0.2 - 1.2 mg/dL    PROTEIN TOTAL 5.3 (L) 6.4 - 8.3 g/dL   IONIZED CALCIUM WITH PH   Result Value Ref Range    PH (VENOUS) 7.35 7.32 - 7.43    IONIZED CALCIUM 1.12 (L) 1.15 - 1.33 mmol/L   MAGNESIUM    Result Value Ref Range    MAGNESIUM  1.9 1.6 - 2.4 mg/dL   PHOSPHORUS   Result Value Ref Range    PHOSPHORUS 6.4 (H) 2.5 - 4.5 mg/dL   PT/INR   Result Value Ref Range    PROTHROMBIN TIME 21.4 (H) 12.1 - 15.3 seconds    INR 1.84 (H) 0.86 - 1.14   PTT (PARTIAL THROMBOPLASTIN TIME)   Result Value Ref Range    APTT 38.0 (H) 22.4 - 34.8 seconds   FIBRINOGEN   Result Value Ref Range    FIBRINOGEN 333 251 - 576 mg/dL   PRODUCT: FFP/PLASMA - UNITS : 2 Units   Result Value Ref Range     Coding System ISBT128     UNIT NUMBER [id number]     BLOOD COMPONENT TYPE THAWED PLASMA     UNIT DIVISION 00     UNIT DISPENSE STATUS ALLOCATED     TRANSFUSION STATUS OK TO TRANSFUSE     Product Code Z4451C99     Coding System ISBT128     UNIT NUMBER [id number]     BLOOD COMPONENT TYPE THAWED PLASMA     UNIT DIVISION 00     UNIT DISPENSE STATUS ALLOCATED     TRANSFUSION STATUS OK TO TRANSFUSE     Product Code Z7315C99    PRODUCT: PLATELETS - UNITS , 1 Units   Result Value Ref Range    Coding System ISBT128     UNIT NUMBER [id number]     BLOOD COMPONENT TYPE PATHOGEN REDUCED PLASMA REDUCED PHER     UNIT DIVISION 00     UNIT DISPENSE STATUS ISSUED     TRANSFUSION STATUS OK TO TRANSFUSE     Product Code E8340V00    TRANSTHORACIC ECHOCARDIOGRAM - ADULT   Result Value Ref Range    EF VISUAL ESTIMATE 55  EF 60    POC BLOOD GLUCOSE (RESULTS)   Result Value Ref Range    GLUCOSE, POC 294 (H) 70 - 100 mg/dl   CBC   Result Value Ref Range    WBC 6.9 3.7 - 11.0 x10^3/uL    RBC 2.45 (L) 3.85 - 5.22 x10^6/uL    HGB 7.1 (L) 11.5 - 16.0 g/dL    HCT 78.4 (L) 65.1 - 46.0 %    MCV 87.8 78.0 - 100.0 fL    MCH 29.0 26.0 - 32.0 pg    MCHC 33.0 31.0 - 35.5 g/dL    RDW-CV 84.9 88.4 - 84.4 %    PLATELETS 42 (L) 150 - 400 x10^3/uL    MPV 10.8 8.7 - 12.5 fL   COMPREHENSIVE METABOLIC PANEL, NON-FASTING   Result Value Ref Range    SODIUM 149 (H) 133 - 144 mmol/L    POTASSIUM 3.3 3.2 - 5.0 mmol/L    CHLORIDE 103 96 - 106 mmol/L    CO2 TOTAL 27 22 - 30 mmol/L    ANION GAP 19 (H) 7 - 18 mmol/L    BUN 59 (H) 8 - 23 mg/dL    CREATININE 8.44 (H) 0.50 - 0.90 mg/dL    ESTIMATED GFR 36 (L) >90 mL/min/1.72m^2    ALBUMIN 3.1 (L) 3.5 - 5.2 g/dL    CALCIUM 9.4 8.3 - 89.2 mg/dL    GLUCOSE 793 (H) 74 - 109 mg/dL    ALKALINE PHOSPHATASE 310 (H) 35 - 129 U/L    ALT (SGPT) 1,025 (H) 0 - 33 U/L    AST (SGOT) 2,418 (H) 0 - 32 U/L    BILIRUBIN TOTAL 5.6 (H) 0.2 - 1.2 mg/dL    PROTEIN TOTAL 5.2 (L) 6.4 - 8.3 g/dL   IONIZED CALCIUM WITH PH   Result  Value Ref Range    PH (VENOUS) 7.46 (H) 7.32 - 7.43    IONIZED CALCIUM 1.19 1.15 - 1.33 mmol/L   MAGNESIUM    Result Value Ref Range    MAGNESIUM  1.9 1.6 - 2.4 mg/dL   PHOSPHORUS   Result Value Ref Range    PHOSPHORUS 4.5 2.5 - 4.5 mg/dL   PT/INR   Result Value Ref Range    PROTHROMBIN TIME 20.8 (H) 12.1 - 15.3 seconds    INR 1.77 (H) 0.86 - 1.14   PTT (PARTIAL THROMBOPLASTIN TIME)   Result Value Ref Range    APTT 38.7 (H) 22.4 - 34.8 seconds   FIBRINOGEN   Result Value Ref Range    FIBRINOGEN 305 251 - 576 mg/dL   TEG, RAPID GLOBAL WITH LYSIS (TRAUMA)   Result Value Ref Range    R (CK) >17.0 (H) 4.6 - 9.1 min    LYS30 (%) 0.0 0.0 - 2.6 %    MA (CRT RAPID) 47.3 (L) 52.0 - 70.0 mm    MA FIBRINOGEN (CFF) 19.1 15.0 - 32.0 mm   BLOOD GAS W/ CO-OX, LYTES, LACTATE REFLEX Arterial   Result Value Ref Range    %FIO2 (ARTERIAL) 50 %    PH (ARTERIAL) 7.41 7.35 - 7.45    PCO2 (ARTERIAL) 44 35 - 45 mm/Hg    PO2 (ARTERIAL) 107 83 - 108 mm/Hg    BICARBONATE (ARTERIAL) 27.2 21.0 - 28.0 mmol/L    BASE EXCESS (ARTERIAL) 2.9 0.0 - 3.0 mmol/L    PAO2/FIO2 RATIO 214     O2 SATURATION (ARTERIAL) 98.2 94.0 - 98.0 %    HEMOGLOBIN 9.0 (L) 12.0 - 18.0 g/dL  HEMATOCRITRT 27 (L) 37 - 50 %    OXYHEMOGLOBIN 97.9 (H) 90.0 - 95.0 %    CARBOXYHEMOGLOBIN 1.4 <=3.0 %    MET-HEMOGLOBIN <0.7 <=1.5 %    O2CT 12.6 %    SODIUM 143 136 - 145 mmol/L    WHOLE BLOOD POTASSIUM 3.3 (L) 3.5 - 5.1 mmol/L    CHLORIDE 105 98 - 107 mmol/L    IONIZED CALCIUM 1.22 1.15 - 1.33 mmol/L    GLUCOSE 198 (H) 65 - 125 mg/dL    LACTATE 5.9 (HH) <=8.0 mmol/L   LACTIC ACID LEVEL W/ REFLEX FOR LEVEL >2.0   Result Value Ref Range    LACTIC ACID 6.2 (HH) 0.5 - 2.0 mmol/L   POC BLOOD GLUCOSE (RESULTS)   Result Value Ref Range    GLUCOSE, POC 176 (H) 70 - 100 mg/dl       IMAGES :  Results for orders placed or performed during the hospital encounter of 07/08/24 (from the past 72 hours)   CT BRAIN WO IV CONTRAST     Status: Abnormal    Narrative    Lucyann JEAN Frid    CT BRAIN WO IV  CONTRAST performed on 07/15/2024 8:16 AM.    INDICATION:  Fall   Additional History:  fall, nausea, weakness    TECHNIQUE:  Unenhanced CT head with axial, coronal, and sagittal multiplanar reformations. Dose modulation, automated exposure control, and/or iterative reconstruction were used for dose reduction.    COMPARISON: June 14, 2024    FINDINGS:  There is a small amount of acute blood products in the subdural space overlying the left frontal lobe anteriorly and inferiorly. This measures 6 mm in thickness. A small collection of blood in the subdural space along the high convexity of the left frontal lobe is also seen. There is very mild mass effect on the left frontal lobe with no significant effacement of the ventricular system, edema or midline shift.    Elsewhere, the brain, ventricles and subarachnoid spaces are within normal limits. There is no evidence of acute large vessel infarct. No acute blood products are seen elsewhere. The calvarium appears intact.              Impression    Small volume of acute hemorrhage in the subdural space overlying the left frontal lobe anteriorly and superiorly as detailed above. No significant change otherwise seen.  .  .  .  s/d/g    A Critical Red actionable finding has been sent via the [employer] Actionable Findings application on 07/15/2024 8:26 AM, Message ID [id number]. Receipt of this communication will be communicated to [facility] RADIOLOGY STAFF or responsible provider and will be documented in [employer] Actionable Findings System upon receiving the acknowledgement.    A Critical Red actionable finding has been sent via the [employer] Actionable Findings application on 07/15/2024 8:26 AM, Message ID [id number]. Receipt of this communication will be communicated to [facility] RADIOLOGY STAFF or responsible provider and will be documented in [employer] Actionable Findings System upon receiving the acknowledgement.      Radiologist location ID: [id number]     CT  BRAIN WO IV CONTRAST     Status: None    Narrative    Jestine JEAN Eisler    CT BRAIN WO IV CONTRAST performed on 07/16/2024 6:33 AM.    INDICATION:  Subdural hematoma   Additional History:  Subdural hematoma    TECHNIQUE:  Unenhanced CT head with axial, coronal, and sagittal multiplanar reformations. Dose modulation, automated  exposure control, and/or iterative reconstruction were used for dose reduction.    COMPARISON: Previous day    FINDINGS:  Small amount of acute blood in the subdural space overlying the left frontal lobe anteriorly and also superiorly is stable in appearance. No significant mass effect or midline shift.    No acute blood products are seen elsewhere. No adverse interval change is seen.              Impression    Stable exam  .  .  .  s/d/g        Radiologist location ID: [id number]     NUC CHOLESCINTIGRAPHY HEPATO IMAGING WO EF     Status: None    Narrative    Zorana JEAN Todaro    NUC CHOLESCINTIGRAPHY HEPATO SYSTEM WITHOUT PHARM performed on 07/16/2024 6:44 PM.    INDICATION:  Increased bilirubin s/p cholecystectomy   Additional History:  abdominal pain, recent cholecystectomy, elevated liver enzymes    TECHNIQUE:  Routine hepatobiliary scan performed with sequential anterior planar imaging of the abdomen.    RADIOPHARMACEUTICAL:  6.9mCi of Tc99m Mebrofenin IV    * Anterior images extending to 4 hours were completed.  * Activity is demonstrated throughout the liver which appears dominant in size, difficult to determine by nuclear medicine study, however enlargement CT 07/13/2024.  * There is marked delay in activity within the biliary system, demonstrated on the 2 and 4 hour delayed images, indicating hepatocellular dysfunction, clinical assessment required.  * 4 hour image reveals subtle increased activity lateral to the lower right hepatic lobe, extending into the right abdomen and pelvis, in the expected region of the paracolic gutter. This is certainly worrisome for bile duct leak.       Impression    * IMPRESSION:    * Study indicating hepatic dysfunction with delay in activity within the biliary system, activity within the expected region of the right paracolic gutter on 4 hour study is worrisome for but not clearly diagnostic of a bile duct leak, ERCP with positive contrast evaluation may certainly be of value      Radiologist location ID: [id number]     XR CHEST AP     Status: None    Narrative    Leeona JEAN Kolton    XR CHEST AP performed on 07/17/2024 1:33 AM.    INDICATION:  hypoxia   Additional History:  central line placement check; hypoxia    TECHNIQUE:  Single frontal view of the chest.  1 views/1 images submitted for interpretation.    COMPARISON:  Chest radiograph 07/08/2024  ___________________________________  FINDINGS:    Right IJ central venous catheter terminates at the lower SVC. Sternal plates and wires appear intact.  Cardiomediastinal silhouette is normal in size. Atherosclerotic calcification of the aortic knob.  Known right hilar adenopathy. Negative for pleural effusion. Biapical predominant coarsened interstitial markings likely reflecting chronic parenchymal changes. Negative for pneumothorax.  ___________________________________    Impression    1. Right IJ central venous catheter in appropriate position.  2. Chronic interstitial changes similar to prior radiograph; superimposed infectious process difficult to exclude.      Radiologist location ID: [id number]     CT BRAIN WO IV CONTRAST     Status: None    Narrative    Afrah JEAN Greenley    CT BRAIN WO IV CONTRAST performed on 07/17/2024 3:13 AM.    INDICATION:  known SDH, AMS   Additional History:  known SDH, AMS, mechanical ventilation  TECHNIQUE:  Unenhanced CT head with axial, coronal, and sagittal multiplanar reformations. Dose modulation, automated exposure control, and/or iterative reconstruction were used for dose reduction.    COMPARISON:  CT 07/16/2024  ___________________________________  FINDINGS:    Small  volume extra-axial subdural hemorrhage along the anterior and superior left frontal lobe measuring 4 mm (series 10, image 21) and 3 mm (series 10, image 25) respectively not changed from prior CT. Negative for new or increasing blood products. Negative for midline shift.  The ventricles are within normal limits for size, configuration, and symmetry.     The grey-white differentiation is well preserved throughout, with no evidence of acute infarct.    Brain parenchymal volume is appropriate for age. Mild periventricular patchy hypoattenuation reflecting sequela chronic microvascular ischemic change.    Osseous structures are unremarkable.  Left maxillary and scattered ethmoid air cell mucosal thickening. Remaining paranasal sinuses are clear.  Mastoid air cells are clear.   ___________________________________    Impression    Small-volume left-sided subdural hemorrhage similar to prior CT. Negative for new or increasing blood products.        Radiologist location ID: [id number]     CT CHEST ABDOMEN PELVIS WO IV CONTRAST     Status: Abnormal    Narrative    Jamelia JEAN Topping    CT CHEST ABDOMEN PELVIS WO IV CONTRAST performed on 07/17/2024 3:15 AM.    INDICATION:  AMS, lethargy, hypotension   Additional History:  AMS, lethargy, hypotension, mechanical ventilation, history of CABG    TECHNIQUE:  CT chest, abdomen, and pelvis with axial, coronal, and sagittal multiplanar reformations, performed without contrast. Dose modulation, automated exposure control, and/or iterative reconstruction were used for dose reduction.    COMPARISON:  CT 07/08/2024, CT 07/12/2024, CT 07/13/2024.  ___________________________________  FINDINGS:    CT CHEST:    LUNGS / AIRWAYS: Groundglass opacities of the right upper and left upper lobes as well as the lingula which are new from 07/08/2024 CT. Right perihilar mass is poorly defined given contiguity with the mediastinum on this noncontrast examination, however mass appears grossly similar in  size to 07/08/2024 CT. Increasing interstitial prominence at the peripheral aspect of the mass (series 3, image 32).    HEART / GREAT VESSELS: Heart is stable in size. Thoracic aorta is unremarkable. Atherosclerotic calcification of the coronary arteries is severe.    MEDIASTINUM: Confluent right hilar and mediastinal lymphadenopathy appears similar to 07/08/2024 CT of the structures otherwise poorly evaluated on noncontrast examination. Negative for axillary lymphadenopathy.    OTHER:  The visualized thyroid gland is unremarkable.  Endotracheal tube terminates above the carina but insinuates toward the right mainstem bronchus (series 6, image 53). Enteric tube terminates in the stomach.    CT ABDOMEN/PELVIS:    HEPATOBILIARY:  Hepatomegaly measuring approximately 21.6 cm in maximum craniocaudal dimension. Mild periportal edema. Negative for apparent focal liver lesion, limited in evaluation on this noncontrast examination.  Hematoma in the gallbladder fossa not changed from 07/13/2024 CT.  Negative for apparent intrahepatic biliary ductal dilation. Extrahepatic bile duct are well-visualized.  PANCREAS: No focal masses or ductal dilatation.  SPLEEN: No splenomegaly.    ADRENALS: No adrenal nodules.  KIDNEYS/URETERS/BLADDER: Negative for hydroureteronephrosis. Multiple nonobstructing calculi bilaterally similar to prior CT. Urinary bladder is decompressed, limited for evaluation.  REPRODUCTIVE: Negative for acute abnormality.    PERITONEUM / RETROPERITONEUM: Large volume hemoperitoneum which is predominantly low attenuation with layering increased attenuation in the pelvic cul-de-sac increased in volume from 07/13/2024 CT. Negative  for pneumoperitoneum.  LYMPH NODES: No lymphadenopathy.  VESSELS: Abdominal aorta is normal in caliber.    GI TRACT: No distention, wall thickening, or inflammatory stranding.  The appendix is not seen, but there are no secondary signs of appendicitis.  ABDOMINAL WALL:  Postsurgical change of  the ventral abdominal wall. Few subcutaneous hematomas in the right ventral abdominal wall not changed from prior CT.    SOFT TISSUES: Unremarkable.    CT BONES:  Negative for acute displaced fracture or dislocation.  ___________________________________    Impression    1. Large volume hemoperitoneum, increased from 07/13/2024 CT. Persistent hyperattenuating material in the gallbladder fossa. Correlate with concern for ongoing extravasation.  2. Groundglass opacities in the right greater than left upper lobes new from prior CT suspicious for developing multifocal infectious process.   3. Endotracheal tube terminates above the carina but insinuates towards the right mainstem bronchus; consider slight retraction. Enteric tube terminating in the gastric body.  4. Right infrahilar mass poorly delineated on noncontrast examination, similar in size to 07/08/2024 CT. Increasing adjacent groundglass opacity and nodularity which could represent infectious/inflammatory process versus progression.  5. Additional chronic findings as above.      A Critical Red actionable finding has been sent via the [employer] Actionable Findings application on 07/17/2024 3:57 AM, Message ID [id number]. Receipt of this communication will be communicated to [facility] RADIOLOGY STAFF or responsible provider and will be documented in [employer] Actionable Findings System upon receiving the acknowledgement.      Radiologist location ID: [id number]       Physical Exam:  Physical Exam  Vitals reviewed.   Constitutional:       Appearance: Normal appearance. She is normal weight.   HENT:      Head: Normocephalic.      Nose: Nose normal.      Mouth/Throat:      Pharynx: Oropharynx is clear.   Eyes:      Extraocular Movements: Extraocular movements intact.      Conjunctiva/sclera: Conjunctivae normal.   Cardiovascular:      Rate and Rhythm: Normal rate.      Pulses: Normal pulses.   Pulmonary:      Effort: Pulmonary effort is normal.   Abdominal:       Palpations: Abdomen is soft.   Musculoskeletal:         General: Normal range of motion.      Cervical back: Normal range of motion.   Skin:     General: Skin is dry.      Coloration: Skin is jaundiced.   Neurological:      Mental Status: She is alert. She is disoriented.   Psychiatric:         Cognition and Memory: Cognition is impaired. Memory is impaired.       Problem List:  Active Hospital Problems   (*Primary Problem)    Diagnosis    *Acute hypoxic respiratory failure (CMS HCC)    Elevated LFTs    Hemoperitoneum    Subdural hematoma (CMS HCC)    NSVT (nonsustained ventricular tachycardia) (CMS HCC)    Heart failure, unspecified HF chronicity, unspecified heart failure type (CMS HCC)    Acute cholecystitis    Thickening of wall of gallbladder with pericholecystic fluid    Symptomatic anemia    COVID-19    Acute blood loss anemia    Transaminitis    Malignant neoplasm of lung, unspecified laterality, unspecified part of lung (CMS HCC)    Pulmonary HTN (  CMS HCC)    Pneumonia due to COVID-19 virus    Mood disorder (CMS HCC)    Anemia, unspecified type    Cigarette smoker    Metastasis to liver    History of breast cancer     Chronic    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  She has been COVID positive this admission.  CTA chest without evidence of PE.  She is on p.o. antibiotics.    Small-cell lung CA with liver metastasis:  Follows with Medical Oncology on outpatient basis at [facility].  She was to receive carboplatin/etoposide/Tecentriq on 07/08/2024 when she presented with acute respiratory symptoms that prompted further evaluation by the emergency room as  stated in the above HPI.  Withhold current chemotherapy regimen until resolution of acute conditions.    Normocytic hypochromic anemia:   Current H/H = 7.1/21.5.  She received PRBC transfusion on 07/16/2024.  Has received multiple PRBC transfusions since being admitted.  Could be secondary to acute illness or metastatic disease.  Noted hemoperitoneum per recent CT abdomen/pelvis.  Vitamin B12 normal at 1106.  Folate low at 3.5.  On daily folic acid .  Copper normal at 113.  Noted transaminitis as well as elevated alk-phos.  This is likely related to liver mets.  Continue to monitor counts.  Transfuse with PRBC if hemoglobin less than 7 grams/deciliter or greater than 7 and symptomatic.  Will benefit from blood transfusion.    Thrombocytopenia:  Current counts = 42,000.  She is s/p recent fall, with CT head revealing small subdural hemorrhage and associated mental status change.  Currently in ICU for closer monitoring and management.  Low platelet counts secondary to hepatic dysfunction r/t liver metastasis.  Defer further thrombocytopenia panel at this time.  Continue to monitor counts.  Transfuse with platelets if <50 K and actively bleeding or <10 K w/wo bleeding.  Previously slightly elevated PT/INR.  Normal PTT and fibrinogen.    She has received FFP transfusion since being admitted, most recently being 07/15/2024.  Has 1 bag FFP on hold - PT/INR to be rechecked this morning.  Give remaining bag of FFP today if INR remains above 1.5.    Subdural hemorrhage: As above, as evidenced by CT head.  Neurosurgery and neurology following.  Keep platelets greater than 100,000. Transfuse FFPs if INR greater than 1.5.    Cholelithiasis: General surgery is following.  She is s/p cholecystectomy  07/11/2024.  Noted hemoperitoneum per recent CT abdomen/pelvis.  General surgery without further surgical intervention at this time - recommending serial H/H checks.    Hypertension:  Antihypertensives held this morning due to noted  hypotension and in light of recent bleed, mentation change.    HLD:  On statin.    CAD:  History of CABG x1.  81 mg ASA held at this time.      Rosina Ice, APRN, CNP    Attending note:  I have independently seen and examined the patient on the same date of service as our mid-level.  I agree with the plan of care as documented by my  mid-level.  I have personally reviewed  the pertinent medications, labs  and radiographic studies  as documented in my mid-level note.  The case was discussed in detail with the midlevel provider.  I agree with the above note.  Some changes and additions were made.  I spent more than 50% of the total encounter time for the care of this patient and documentation.      Norleen Amen, MD

## 2024-07-17 NOTE — Procedures (Signed)
 Central Venous Catheter Insertion Procedure Note    Procedure: Insertion of Central Venous Catheter    Indications:  vascular access    Surgeon: Wilkie Orchard, APRN, CNP    Procedure Details   Informed consent was obtained for the procedure, including possible sedation.  Risks of lung perforation, hemorrhage, infection, arrhythmia, and adverse drug reaction were discussed.       Time-Out Process performed,  verified patient identification,  verified procedure,  verified site/side,  verified correct patient position,  special equipment/implants available.    The skin above the right internal jugular vein was prepped with chlorhexidine. The vessel was identified with the ultrasound and found to be patent. Under sterile conditions (hand hygiene prior to donning gloves, gown, mask, sterile drape), maximal barrier precautions (mask, hat, sterile gloves and gown for the person performing the procedure and  full body drape on the patient) were utilized. Local anesthesia was infiltrated into the skin and subcutaneous tissues.  An 18-gauge needle was inserted into the vein under real time ultrasound guidance the needle was visualized within the vessel. A guide wire was easily inserted through the needle. While inserting the guide wire no arrhythmias occurred. The guidewire was identified within the vessel with real time ultrasound guidance. The needle was  withdrawn over the guide wire. A scalpel was used to puncture the skin entry site to facilitate catheter insertion.  The dilator was passed over the guide wire and removed.  A triple-lumen catheter was inserted into the vessel over the guide wire. The guide wire was removed.  Catheter was aspirated for blood, flushed, and secured with STAT-LOCK. A bio-patch and sterile dressing were applied to the catheter.  An IV line was connected to the central line to perform a drop test.    Ultrasound Guidance: yes    Complications:  None; patient tolerated the procedure well.            Condition: stable.    Recommendations: CXR ordered to verify placement.    This procedure was performed independently of the time included in today's admission/progress note(s).    Dr. Francis was immediately available for the entire procedure by phone or pager    Wilkie Orchard, APRN, CNP

## 2024-07-17 NOTE — Pharmacy (Signed)
 [facility]  Department of Pharmaceutical Services  Therapeutic Drug Monitoring: Vancomycin  07/17/2024      [name], 68 y.o. female  Date of Birth:  09-04-1956  Date of Admission:  07/08/2024  MRN# [id number]    Weight: 66.6 kg (146 lb 14.4 oz)  Ideal body weight: 47.8 kg (105 lb 6.1 oz)  Adjusted ideal body weight: 55.3 kg (121 lb 15.8 oz)   Body mass index is 27.76 kg/m.    Subjective / Objective    Reason for Consult: [name] is a 68 y.o. female on vancomycin with an indication of Pneumonia with a goal trough of 10-15 mcg/mL, and, if applicable, an AUC/MIC of 400-600 mcg*hr/mL. Pharmacy is consulted for management of vancomycin.     Requesting Provider: Sharyle Dayhoff    Renal Replacement Therapy: None    Vitals:  Temperature: 36.7 C (98.1 F)  Heart Rate: 92  BP (Non-Invasive): 138/78  Respiratory Rate: 13    Recent Labs:  Lab Results   Component Value Date    WBC 11.3 (H) 07/17/2024    BUN 63 (H) 07/17/2024    CREATININE 1.56 (H) 07/17/2024    PRCAL 79.80 (H) 07/17/2024    CRP 8.5 (H) 07/17/2024     Serum creatinine: 1.56 mg/dL (H) 89/91/74 9679  Estimated creatinine clearance: 30.5 mL/min (A)    Vancomycin Dosing & Monitoring    Loading Dose / Date / Time: 1g x 1 dose 10/8 @ 0354    Date RPh SCr Current Dose Frequency Level / AUC Comment(s)   10/08 BLC 1.56 Intermittent prn Level Will assess level with AM labs tomorrow                                          Assessment / Plan    [name] starting therapy on current regimen of intermittent dosing  (Adjustments to Current Regimen [If Applicable], Notes, Culture Results, etc.)  Next vancomycin level(s) due 10/9 With AM labs  Continue to monitor renal function and response to therapy  Pharmacy will continue to follow Arland [name] Requena's drug therapy with the primary team; please contact covering pharmacist with any questions or concerns    Lyle Shams, [facility]  x [phone]  07/17/2024, 05:02    Target levels depends on dosing  and monitoring method, AUC vs. trough-based; for AUC-based dosing, units are mcg*hr/mL; for trough-based dosing, units are mcg/mL    Creatinine clearance is estimated using the Cockcroft-Gault equation for adult patients and the Arlana graft for pediatric patients    The Medical Executive Committee at [facility] has granted pharmacists via protocol order the ability to place or discontinue vancomycin level orders as they deem clinically appropriate and place related labs (e.g., CBC/BMP) at least every 3 days or more frequently as they deem clinically appropriate for routine monitoring

## 2024-07-17 NOTE — Anesthesia Procedure Notes (Signed)
 [facility]    Airway Note  General Information and Staff   Authorizing provider: Manford Banks  Performing provider: Manford Banks        Reason: emergent    Airway not difficult    Consent for Airway (if performed for an anesthetic, see related documentation for consents)   Patient identity confirmed: arm band  Consent: The procedure was performed in an emergent situation  Consent given by: patient    Indications and Patient Condition  Pt location: At Bedside  Indications for airway management: airway protection and cardiovascular instability  Sedation level: deep      Preoxygenated: yes Patient position: sniffing     Mask difficulty assessment: 0 - not attempted        Final Airway Details    Final airway type: endotracheal airway    Emergent: RSI    Successful airway: ETT and cuffed   Successful intubation technique: direct laryngoscopy              Endotracheal tube insertion site: oral  Blade: Miller  Blade size: #2  Airway size (mm): 7.5  Cormack-Lehane Classification: grade I - full view of glottis  Placement verified by: chest auscultation, capnometry and palpation of cuff   Marked at 22 and 21  Measured from: lips  Secured with: Device  Number of attempts at approach: 1  Number of other approaches attempted: 0Airway complications: Atraumatic  Medications Administered  ketamine 10 mg/mL injection - Intravenous   25 mg - 07/17/2024 1:40:00 AM  etomidate (AMIDATE) 2 mg/mL injection - Intravenous   10 mg - 07/17/2024 1:40:00 AM

## 2024-07-17 NOTE — Care Plan (Signed)
 PT currently intubated. No sedation vacation or CPAP trials today due to patient's status. Will continue to work closely with physicians to meet goal of extubation when patient is able.     BLOOD GAS  Lab Results   Component Value Date    PH 7.41 07/17/2024    PCO2 44 07/17/2024    PO2 107 07/17/2024    BICARBONATE 27.2 07/17/2024

## 2024-07-17 NOTE — Consults (Signed)
 [facility] Medicine [facility]    [name], [name], 68 y.o. female  Date of Admission:  07/08/2024  Date of Birth:  September 02, 1956    Information Obtained from: history reviewed via medical record      HPI: [name] is a 68 y.o., White female w/recently diagnosed Small-cell carcinoma w/liver mets, COPD, current smoker - not on O2 at home, CABG. She was found to have lung mass on last admission and underwent bronchoscopy w/bx of station 7 which revealed the small cell carcinoma. She came to hospital from outpatient infusion center to start chemotherapy but had dyspnea and hypoxia and was sent to ER for further evaluation. She was found to have COVID. She reports she's been feeling bad for the last few weeks. CTA chest - no PE, marginal increase in size of right infrahilar mass, right hilar and mediastinal lymphadenopathy compared to prior study. CT A/P: hepatomegaly, hepatic steatosis, and hepatic lesions not much change, new pericholecystic fluid now present. We are consulted for acute hypoxic respiratory failure, known patient.      [name] currently on 2 L NC/93%.  She is awake and alert.  Feels breathing is doing okay at present time.  Has some mild cough.  No chest pain.  No fevers noted.  No N/V/D.  No associated symptoms or modifying factors.      10/2: Patient on 3LNC/96%. Awake and alert. Feels breathing is improving. She is s/p lap chole. No chest pain. No fevers noted. No n/v/d.  No associated symptoms or modifying factors.      10/3: Patient on room air/97%. Denies shortness of breath. Not much cough. No chest pain. Having bowel movements. No obvious signs of bleeding. Hgb 6.2 this morning, getting blood transfusion.  No associated symptoms or modifying factors.      10/4:  Today on 3L NC.  Received 2 units PRBCs yesterday.  Hemoglobin up to 8.1 today.  Receiving breathing treatment on exam.  Patient states she is not feeling much better today.     10/5:  On 2 L NC.  Hemoglobin dropped  again this morning so had to get 1 unit PRBC.      10/6: Patient on 2LNC/92%. Fell last night. Moved to ICU for confusion and found to have small volume of acute hemorrhage in subdural space overlying the left frontal lobe anteriorly and superiorly. She is awake and alert. A/Ox 2, oriented to person, place. Daughter at bedside. Denies blurry vision. MAE x4 w/no weakness.  No associated symptoms or modifying factors.      10/7 - did well overnight.  No new issues reported.  Hemoglobin 6.5 and getting PRBC x1.  No bleeding noted.  CT head today shows small stable SDH.  Afebrile.  UIP adequate.  On 3 L/min supplemental oxygen.    10/8: Patient decompensated overnight requiring emergent intubation, initially maxed on 4 pressors, getting 2u PRBC and 2 FFP. HPI/ROS unable to be obtained d/t intubation and sedation.    Past Medical History:   Diagnosis Date    Abdominal pain     Cancer (CMS HCC)     Chest pain     Chronic lung disease     Coronary artery disease     Dyslipidemia     Essential hypertension     Heart disease     History of percutaneous coronary intervention     History of stress test     Mixed hyperlipidemia     Unspecified  chronic bronchitis          Past Surgical History:   Procedure Laterality Date    CARDIAC CATHETERIZATION      CORONARY ANGIOPLASTY      CORONARY ARTERY STENT PLACEMENT      HX BACK SURGERY      HX CORONARY ARTERY BYPASS GRAFT      HX MASTECTOMY, SIMPLE Bilateral     HX TUBAL LIGATION      NECK SURGERY           Medications Prior to Admission       Prescriptions    anastrozole  (ARIMIDEX ) 1 mg Oral Tablet    Take 1 Tablet (1 mg total) by mouth Daily    aspirin  81 mg Oral Tablet, Chewable    Chew 1 Tablet (81 mg total) Once a day    atorvastatin  (LIPITOR) 40 mg Oral Tablet    Take 1 Tablet (40 mg total) by mouth Daily    cholecalciferol , vitamin D3, 25 mcg (1,000 unit) Oral Tablet    Take 1 Tablet (1,000 Units total) by mouth Daily    clonazePAM  (KLONOPIN ) 0.5 mg Oral Tablet    Take 1  Tablet (0.5 mg total) by mouth Twice per day as needed for Other (ANXIETY)    DULoxetine  (CYMBALTA  DR) 60 mg Oral Capsule, Delayed Release(E.C.)    Take 1 Capsule (60 mg total) by mouth Daily    fluticasone  propion-salmeteroL (ADVAIR ) 250-50 mcg/dose Inhalation oral diskus inhaler    Take 1 Puff (1 Inhalation total) by inhalation Twice daily    fluticasone  propionate (FLONASE ) 50 mcg/actuation Nasal Spray, Suspension    1 Spray Once per day as needed for Other (CONGESTION)    gabapentin  (NEURONTIN ) 400 mg Oral Capsule    Take 1 Capsule (400 mg total) by mouth Three times a day    losartan  (COZAAR ) 25 mg Oral Tablet    Take 1 Tablet (25 mg total) by mouth Daily    metoprolol  tartrate (LOPRESSOR ) 25 mg Oral Tablet    Take 1 Tablet (25 mg total) by mouth Twice daily    OLANZapine  (ZYPREXA ) 5 mg Oral Tablet    Take 1 Tablet (5 mg total) by mouth Every night for 3 days    oxyCODONE -acetaminophen  (PERCOCET) 7.5-325 mg Oral Tablet    Take 1 Tablet by mouth Every 4 hours as needed for Pain    prochlorperazine  (COMPAZINE ) 10 mg Oral Tablet    Take 1 Tablet (10 mg total) by mouth Three times a day as needed for Nausea/Vomiting for up to 30 days    promethazine  (PHENERGAN ) 25 mg Oral Tablet    Take 1 Tablet (25 mg total) by mouth Every 8 hours as needed for Nausea/Vomiting    traZODone  (DESYREL ) 150 mg Oral Tablet    Take 1 Tablet (150 mg total) by mouth Every night          Allergies[1]  Social History     Socioeconomic History    Marital status: Widowed   Tobacco Use    Smoking status: Every Day     Current packs/day: 0.50     Average packs/day: 0.5 packs/day for 52.8 years (26.4 ttl pk-yrs)     Types: Cigarettes     Start date: 1973    Smokeless tobacco: Never   Vaping Use    Vaping status: Never Used   Substance and Sexual Activity    Alcohol use: Yes     Comment: occasional    Drug use:  Yes     Types: Marijuana     Social Determinants of Health     Social Connections: Low Risk (07/09/2024)    Social Connections     SDOH  Social Isolation: 5 or more times a week     Family Medical History:       Problem Relation (Age of Onset)    Arthritis Mother, Maternal Grandmother    Cancer Father, Other    Heart Attack Father, Other    Heart Disease Brother, Other    Hypertension (High Blood Pressure) Mother, Father, Other    Pacemaker Mother             ROS: unable to be obtained d/t intubation and sedation       Physical Exam:  General: Chronically and acutely ill appearing female currently intubated and sedated   Eyes: Pupils equal and round, reactive to light and accomodation. , Conjunctivae/corneas clear, EOM's intact, no nystagmus.   HENT: Normocephalic, atraumatic.  Nose without erythema, polyps or rhinorrhea.  Mouth mucous membranes moist , Pharynx without injection or exudate, no oral lesions.   Neck: supple, no lymphadenopathy   Lungs: diminished to auscultation bilaterally.   Cardiovascular: Heart regular rate and rhythm, no murmur, click, rub or gallop  Abdomen: soft, non-tender, non-distended, no organomegaly, no masses and no hernias  Extremities: no edema, redness or tenderness in the calves or thighs, no varicosities  Skin: Skin color jaundice, texture, turgor normal. No rashes or lesions  Neurologic: intubated and sedated  Lymphatics: no lymphadenopathy  Psychiatric: intubated and sedated     LABS:  I have reviewed all lab results.  Lab Results Today:    Results for orders placed or performed during the hospital encounter of 07/08/24 (from the past 24 hours)   CBC   Result Value Ref Range    WBC 8.2 3.7 - 11.0 x10^3/uL    RBC 2.28 (L) 3.85 - 5.22 x10^6/uL    HGB 6.5 (LL) 11.5 - 16.0 g/dL    HCT 79.5 (L) 65.1 - 46.0 %    MCV 89.5 78.0 - 100.0 fL    MCH 28.5 26.0 - 32.0 pg    MCHC 31.9 31.0 - 35.5 g/dL    RDW-CV 83.0 (H) 88.4 - 15.5 %    PLATELETS 40 (L) 150 - 400 x10^3/uL    MPV 11.5 8.7 - 12.5 fL   BASIC METABOLIC PANEL   Result Value Ref Range    SODIUM 145 (H) 133 - 144 mmol/L    POTASSIUM 3.6 3.2 - 5.0 mmol/L    CHLORIDE 107  (H) 96 - 106 mmol/L    CO2 TOTAL 23 22 - 30 mmol/L    ANION GAP 15 7 - 18 mmol/L    CALCIUM 8.3 8.3 - 10.7 mg/dL    GLUCOSE 885 (H) 74 - 109 mg/dL    BUN 54 (H) 8 - 23 mg/dL    CREATININE 8.83 (H) 0.50 - 0.90 mg/dL    BUN/CREA RATIO 47     ESTIMATED GFR 52 (L) >90 mL/min/1.14m^2   MAGNESIUM    Result Value Ref Range    MAGNESIUM  1.9 1.6 - 2.4 mg/dL   PHOSPHORUS   Result Value Ref Range    PHOSPHORUS 2.7 2.5 - 4.5 mg/dL   HEPATIC FUNCTION PANEL   Result Value Ref Range    ALBUMIN 2.8 (L) 3.5 - 5.2 g/dL    ALKALINE PHOSPHATASE 451 (H) 35 - 129 U/L    ALT (SGPT) 502 (H) 0 -  33 U/L    AST (SGOT) 950 (H) 0 - 32 U/L    BILIRUBIN TOTAL 4.7 (H) 0.2 - 1.2 mg/dL    BILIRUBIN DIRECT 3.3 (H) 0.0 - 0.3 mg/dL    PROTEIN TOTAL 5.0 (L) 6.4 - 8.3 g/dL   CROSSMATCH RED CELLS - UNITS , 1 Units   Result Value Ref Range    Coding System ISBT128     UNIT NUMBER [id number]     BLOOD COMPONENT TYPE LR RBC, Adsol1, 04710     UNIT DIVISION 00     UNIT DISPENSE STATUS ISSUED     TRANSFUSION STATUS OK TO TRANSFUSE     IS CROSSMATCH Electronically Compatible     Product Code E0336V00     Coding System ISBT128     UNIT NUMBER [id number]     BLOOD COMPONENT TYPE LR RBC, Adsol1, 04710     UNIT DIVISION 00     UNIT DISPENSE STATUS ISSUED     TRANSFUSION STATUS OK TO TRANSFUSE     IS CROSSMATCH Electronically Compatible     Product Code E0336V00     Coding System ISBT128     UNIT NUMBER [id number]     BLOOD COMPONENT TYPE LR RBC, Adsol3, 04761     UNIT DIVISION 00     UNIT DISPENSE STATUS ISSUED     TRANSFUSION STATUS OK TO TRANSFUSE     IS CROSSMATCH Electronically Compatible     Product Code Z9313C99     Coding System ISBT128     UNIT NUMBER [id number]     BLOOD COMPONENT TYPE LR RBC Adsol3, 04741     UNIT DIVISION 00     UNIT DISPENSE STATUS ALLOCATED     TRANSFUSION STATUS OK TO TRANSFUSE     IS CROSSMATCH Electronically Compatible     Product Code Z9314C99     Coding System ISBT128     UNIT NUMBER [id number]     BLOOD COMPONENT  TYPE LR RBC Adsol3, 04741     UNIT DIVISION 00     UNIT DISPENSE STATUS ALLOCATED     TRANSFUSION STATUS OK TO TRANSFUSE     IS CROSSMATCH Electronically Compatible     Product Code Z9314C99    TYPE AND SCREEN   Result Value Ref Range    UNITS ORDERED 1     ABO/RH(D) O POSITIVE     ANTIBODY SCREEN NEGATIVE     SPECIMEN EXPIRATION DATE 07/12/2024,2359    PT/INR   Result Value Ref Range    PROTHROMBIN TIME 19.2 (H) 12.1 - 15.3 seconds    INR 1.59 (H) 0.86 - 1.14   PRODUCT: PLATELETS - UNITS , 1 Units   Result Value Ref Range    Coding System ISBT128     UNIT NUMBER [id number]     BLOOD COMPONENT TYPE PATHOGEN REDUCED PLASMA REDUCED1     UNIT DIVISION 00     UNIT DISPENSE STATUS ISSUED     TRANSFUSION STATUS OK TO TRANSFUSE     Product Code [id number]    PLATELET COUNT   Result Value Ref Range    PLATELETS 46 (L) 150 - 400 x10^3/uL    MPV     BLOOD GAS W/ CO-OX, LYTES, LACTATE REFLEX Arterial   Result Value Ref Range    %FIO2 (ARTERIAL) 100 %    PH (ARTERIAL) 7.21 (L) 7.35 - 7.45    PCO2 (ARTERIAL) 20 (L) 35 - 45 mm/Hg    PO2 (ARTERIAL) 129 (H)  83 - 108 mm/Hg    BICARBONATE (ARTERIAL) 10.8 (L) 21.0 - 28.0 mmol/L    BASE DEFICIT 18.2 (H) 0.0 - 3.0 mmol/L    PAO2/FIO2 RATIO 129     O2 SATURATION (ARTERIAL) 98.2 94.0 - 98.0 %    HEMOGLOBIN 6.8 (LL) 12.0 - 18.0 g/dL    HEMATOCRITRT 20 (L) 37 - 50 %    OXYHEMOGLOBIN 97.4 (H) 90.0 - 95.0 %    CARBOXYHEMOGLOBIN 1.8 <=3.0 %    MET-HEMOGLOBIN 0.8 <=1.5 %    O2CT 9.6 %    SODIUM 147 (H) 136 - 145 mmol/L    WHOLE BLOOD POTASSIUM 5.2 (H) 3.5 - 5.1 mmol/L    CHLORIDE 104 98 - 107 mmol/L    IONIZED CALCIUM 1.09 (L) 1.15 - 1.33 mmol/L    GLUCOSE 33 (LL) 65 - 125 mg/dL    LACTATE >82.9 (HH) <=8.0 mmol/L   CBC   Result Value Ref Range    WBC 12.3 (H) 3.7 - 11.0 x10^3/uL    RBC 2.15 (L) 3.85 - 5.22 x10^6/uL    HGB 6.0 (LL) 11.5 - 16.0 g/dL    HCT 79.5 (L) 65.1 - 46.0 %    MCV 94.9 78.0 - 100.0 fL    MCH 27.9 26.0 - 32.0 pg    MCHC 29.4 (L) 31.0 - 35.5 g/dL    RDW-CV 83.4 (H) 88.4 -  15.5 %    PLATELETS 46 (L) 150 - 400 x10^3/uL    MPV 11.0 8.7 - 12.5 fL   IONIZED CALCIUM WITH PH   Result Value Ref Range    PH (VENOUS) 7.08 (LL) 7.32 - 7.43    IONIZED CALCIUM 1.09 (L) 1.15 - 1.33 mmol/L   PT/INR   Result Value Ref Range    PROTHROMBIN TIME 27.1 (H) 12.1 - 15.3 seconds    INR 2.52 (H) 0.86 - 1.14   PTT (PARTIAL THROMBOPLASTIN TIME)   Result Value Ref Range    APTT 37.9 (H) 22.4 - 34.8 seconds   POC BLOOD GLUCOSE (RESULTS)   Result Value Ref Range    GLUCOSE, POC 153 (H) 70 - 100 mg/dl       Radiology Results: CT BRAIN WO IV CONTRAST  Result Date: 07/15/2024  Impression Small volume of acute hemorrhage in the subdural space overlying the left frontal lobe anteriorly and superiorly as detailed above. No significant change otherwise seen. . . . s/d/g A Critical Red actionable finding has been sent via the [employer] Actionable Findings application on 07/15/2024 8:26 AM, Message ID [id number]. Receipt of this communication will be communicated to [facility] ED RADIOLOGY STAFF or responsible provider and will be documented in [employer] Actionable Findings System upon receiving the acknowledgement. A Critical Red actionable finding has been sent via the [employer] Actionable Findings application on 07/15/2024 8:26 AM, Message ID [id number]. Receipt of this communication will be communicated to [facility] RADIOLOGY STAFF or responsible provider and will be documented in [employer] Actionable Findings System upon receiving the acknowledgement. Radiologist location ID: [id number]      XR CHEST AP   Final Result      1. Right IJ central venous catheter in appropriate position.   2. Chronic interstitial changes similar to prior radiograph; superimposed infectious process difficult to exclude.         Radiologist location ID: [id number]         NUC CHOLESCINTIGRAPHY HEPATO IMAGING WO EF   Final Result   * IMPRESSION:      * Study  indicating hepatic dysfunction with delay in activity within the biliary system,  activity within the expected region of the right paracolic gutter on 4 hour study is worrisome for but not clearly diagnostic of a bile duct leak, ERCP with positive contrast evaluation may certainly be of value         Radiologist location ID: [id number]         CT BRAIN WO IV CONTRAST   Final Result   Stable exam   .   .   .   s/d/g            Radiologist location ID: [id number]         CT BRAIN WO IV CONTRAST   Final Result   Abnormal   Small volume of acute hemorrhage in the subdural space overlying the left frontal lobe anteriorly and superiorly as detailed above. No significant change otherwise seen.   .   .   .   s/d/g      A Critical Red actionable finding has been sent via the [employer] Actionable Findings application on 07/15/2024 8:26 AM, Message ID [id number]. Receipt of this communication will be communicated to [facility] ED RADIOLOGY STAFF or responsible provider and will be documented in [employer] Actionable Findings System upon receiving the acknowledgement.      A Critical Red actionable finding has been sent via the [employer] Actionable Findings application on 07/15/2024 8:26 AM, Message ID [id number]. Receipt of this communication will be communicated to [facility] RADIOLOGY STAFF or responsible provider and will be documented in [employer] Actionable Findings System upon receiving the acknowledgement.         Radiologist location ID: [id number]         CT ABDOMEN PELVIS WO IV CONTRAST   Final Result      1. Grossly stable hematoma in the gallbladder fossa with slight decrease in volume of hemoperitoneum.   2. Additional stable findings as above.            Radiologist location ID: [id number]         CT ABDOMEN PELVIS WO IV CONTRAST   Final Result      1. Hematoma associated with the gallbladder fossa with fluid tracking inferiorly.       2. Moderate volume hemoperitoneum, more so blood lying in the dependent pelvis, though also around the liver and spleen and pericolic gutters.      3. Other  findings as described above.         Radiologist location ID: [id number]         MRI MRCP WO CONTRAST   Final Result      * Mild extra hepatic bile duct dilatation as noted on CT 07/08/2024, no bile duct stone.      * No gallstones, there is pericholecystic fluid as well as fluid adjacent to the liver.      * Diffuse heterogeneous appearance to the liver as described above, this could certainly represent findings associated with acute hepatitis, differential considerations include regenerative nodularity in chronic liver disease, metastatic disease/lymphoma, and possibly sarcoid               Radiologist location ID: [id number]            Radiologist location ID: [id number]         US  RT UPPER QUADRANT   Final Result   Gallbladder wall thickening with pericholecystic fluid consistent with acute cholecystitis.      Dilated common bile duct without distal  obstructing lesion identified.      Hepatomegaly with hepatic steatosis.      Trace ascites.               Radiologist location ID: [id number]         CT ANGIO CHEST FOR PULMONARY EMBOLUS W IV CONTRAST   Final Result      1. No evidence of pulmonary embolism.   2. Marginal increase in the size of the right infrahilar mass, right hilar and mediastinal lymphadenopathy compared to the prior study.   3. Other chronic findings as described above.            Radiologist location ID: [id number]         CT ABDOMEN PELVIS W IV CONTRAST   Final Result      1. Hepatomegaly, hepatic steatosis and hepatic lesions, not greatly changed and new pericholecystic fluid now present.   2. Multiple nonobstructing bilateral renal calculi.   3. New free fluid in the pelvis.         Radiologist location ID: [id number]         XR AP MOBILE CHEST   Final Result      1. Right hilar adenopathy.      2. Vague pulmonary opacities could be mild pneumonia or atelectasis.            Radiologist location ID: [id number]            Immunization History   Administered Date(s) Administered     Covid-19 Vaccine,Moderna Bivalent 11/24/2021    Covid-19 Vaccine,Moderna,12 Years+ 01/18/2020, 02/22/2020, 10/31/2020       DNR Status:  FULL CODE: ATTEMPT RESUSCITATION/CPR      ASSESSMENT:    Active Hospital Problems    Diagnosis    Primary Problem: Acute hypoxic respiratory failure (CMS HCC)    Subdural hematoma (CMS HCC)    NSVT (nonsustained ventricular tachycardia) (CMS HCC)    Heart failure, unspecified HF chronicity, unspecified heart failure type (CMS HCC)    Acute cholecystitis    Thickening of wall of gallbladder with pericholecystic fluid    Symptomatic anemia    COVID-19    Acute blood loss anemia    Transaminitis    Malignant neoplasm of lung, unspecified laterality, unspecified part of lung (CMS HCC)    Pulmonary HTN (CMS HCC)    Pneumonia due to COVID-19 virus    Mood disorder (CMS HCC)    Anemia, unspecified type    Cigarette smoker    Metastatic cancer to liver    History of breast cancer    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)    Paroxysmal atrial fibrillation (CMS HCC)    Hypertension    CAD (coronary artery disease)     DVT/PE Prophylaxis: SCDs/ Venodynes/Impulse boots    PLAN:  Patient seen and examined  Chart and labs reviewed  67F PMH breast ca s/p double mastectomy, COPD, CABG, recent dx small cell lung ca w/mets liver initially presented to ED on 9/29 from Dr. Norleen office for SOB, CP, nausea - had went for initial round of chemo which she was unable to receive   Was found to have acute cholecystitis, underwent lap chole with Dr. Nadean on 10/2  Suffered fall on 10/6 - underwent head CT that showed small volume acute SDH left frontal lobe  Neurosx following  Underwent MRCP 10/7 showing possible bile leak recommending ERCP  Decompensated overnight requiring emergent intubation, maxed on pressors  Will re-panculture   Empirically cover with Zosyn/Vanc  Stat labs now   ABG showing severe metabolic acidosis 7.21/20/10.8/98  Hgb 6, give 2 u PRBC now  Will emergently  intubate  Place A line   Place central line  Titrate IV vasopressors for goal MAP > 65  Currently requiring levo, vaso, neo and epi  Give 2 amps bicarb now and 1 g calcium chloride  Start D5 bicarb gtt @ 150  Send for repeat head CT, CT chest/abd/pelvis  Stress dose steroids with hydrocortisone 50 Q6  Check inflammatory markers  Place 4u PRBC on hold  Serial labs, ABG's, lactic   Tight glucose control with SSI  Trend and replace electrolytes  GI prophylaxis with IV Protonix   Discussed with RN  Discussed with RT  Discussed with my attending  Discussed with daughter over the phone and at bedside, she wishes for her mother to remain a full code at this time  [name] critically ill with organ failure (resp, metabolic, heme, hepatic, renal, sepsis)  Pulmonary attending addendum to follow     CCT 19 min    Sharyle Dayhoff, APRN, CNP,          Attending physician:  I have seen and examined the patient.  Medications, labs, images reviewed.    Continue ventilator support.  Check ABG and adjust ventilator accordingly.    Vent bundle, chlorhexidine, Protonix , SCDs.  Titrate pressors - goal MAP >65 and SBP <150 with good urine output.    Continue bicarb drip.  Continue to hold all anticoagulation.    Monitor CBC/coags and transfuse PRBC/platelets/FFP as needed.    Continue empiric antibiotics.  No pathogens isolated.  Continue stress dose steroids IV-->PO when more stable.    Accu-Cheks with SSI.  Monitor creatinine, urine output, electrolytes and replace as needed.  Follow ammonia levels and LFTs.  Start tube feeds if unable to extubate soon.  Neurosurgery following.    Neurology following.  Oncology following.  General surgery following.  Echo pending.  Previous echo showed EF 55-60% with grade 1 diastolic dysfunction.  CT head images reviewed and compared to prior available studies.  Stable small left frontal lobe subdural hematoma.  CT chest abdomen and pelvis images reviewed and compared to prior available studies.   Large volume hemoperitoneum increased from prior studies.  GGO infiltrates in the lungs.  Stable right infrahilar mass.    I discussed case with surgery service at bedside.  Would be very high-risk for surgery.  Likely due to oozing from cholecystectomy site.    Continue to replace blood products and correct coagulopathy.  Family updated at bedside.    [name] medically complex with increased risk for sudden decompensation and requires close monitoring of vitals, labs, and/or imaging.      [name] critically ill with acute organ system failure and has high risk of sudden decompensation resulting in morbidity/mortality without ICU level of care. Prognosis is guarded.  CCT performed by me personally: 90 min. CCT time does not include billable procedures. CCT time does include review of pertinent information database, available radiographic images, discussion with other consulting physicians, and formulating a comprehensive care plan that was discussed with care team.    Manus Free, DO   07/17/2024  09:07  Pulmonary and Critical Care Medicine            [1] No Known Allergies

## 2024-07-17 NOTE — Care Plan (Signed)
 Problem: Non-violent/Non-Self Destructive Restraints  Goal: Alternative methods tried prior to restraints  Outcome: Ongoing (see interventions/notes)  Goal: Patient free from injury and discomfort  Outcome: Ongoing (see interventions/notes)  Goal: Autonomy maintained at the highest possible level  Outcome: Ongoing (see interventions/notes)  Goal: Need for restraints reassessed per policy  Outcome: Ongoing (see interventions/notes)  Goal: Patient education provided  Outcome: Ongoing (see interventions/notes)  Goal: Problem Interventions  Outcome: Ongoing (see interventions/notes)

## 2024-07-17 NOTE — Consults (Signed)
 [facility]   GI Progress Note    [name], [name], 68 y.o. female  Date of Admission:  07/08/2024  Date of Birth:  07/21/1956      SUBJECTIVE:  Recall due to concern for bile leak on HIDA scan.    Patient initially seen on September 30th and was recent diagnosis of small-cell lung cancer with liver metastasis who initially presented to the hospital with shortness of breath hypoxia and diagnosed with COVID.   GI consult at that time was for evaluation for anemia rule out GI bleed. Recommended recall with any obvious brisk GI bleeding.    Patient had underwent robotic cholecystectomy 5 days ago and had experienced some bleeding from trocar sites and found to have large volume hemoperitoneum on CT imaging overnight last night that had increased from previous CT.  Patient had been hypotensive required significant IV pressors overnight and several blood products.  Currently on vasopressin remains intubated.  Surgery is following.  Patient did have an increase in her Tbili and LFTs in the last few days. HIDA Scan completed yesterday that showed hepatic dysfunction with delay in activity in biliary system worrisome for but no clearly diagnostic of bile duct leak.         OBJECTIVE:  Vital Signs:  Temperature: 37 C (98.6 F)  Heart Rate: 83  BP (Non-Invasive): 139/76  Respiratory Rate: 13  SpO2: 92 %      Physical Exam  Constitutional:       Appearance: No distress  HENT:      Head: Normocephalic and atraumatic.      Mouth: Mucous membranes are moist     Eyes:PERRLA  Cardiovascular:      Rate and Rhythm: Normal rate and regular rhythm.    Pulmonary:      Breath sounds: clear, diminished   Abdominal:      General: Abdomen is soft, mild distention  Skin:     General: Skin is dry.   Neurological:      Mental Status: intubated/sedated        LABS:  I have reviewed all lab results.  Lab Results Today:    Results for orders placed or performed during the hospital encounter of 07/08/24 (from the past 24 hours)    PLATELET COUNT   Result Value Ref Range    PLATELETS 46 (L) 150 - 400 x10^3/uL    MPV     OCCULT BLOOD, STOOL   Result Value Ref Range    OCCULT BLOOD Positive (A) Negative   BLOOD GAS W/ CO-OX, LYTES, LACTATE REFLEX Arterial   Result Value Ref Range    %FIO2 (ARTERIAL) 100 %    PH (ARTERIAL) 7.21 (L) 7.35 - 7.45    PCO2 (ARTERIAL) 20 (L) 35 - 45 mm/Hg    PO2 (ARTERIAL) 129 (H) 83 - 108 mm/Hg    BICARBONATE (ARTERIAL) 10.8 (L) 21.0 - 28.0 mmol/L    BASE DEFICIT 18.2 (H) 0.0 - 3.0 mmol/L    PAO2/FIO2 RATIO 129     O2 SATURATION (ARTERIAL) 98.2 94.0 - 98.0 %    HEMOGLOBIN 6.8 (LL) 12.0 - 18.0 g/dL    HEMATOCRITRT 20 (L) 37 - 50 %    OXYHEMOGLOBIN 97.4 (H) 90.0 - 95.0 %    CARBOXYHEMOGLOBIN 1.8 <=3.0 %    MET-HEMOGLOBIN 0.8 <=1.5 %    O2CT 9.6 %    SODIUM 147 (H) 136 - 145 mmol/L    WHOLE BLOOD POTASSIUM 5.2 (H) 3.5 -  5.1 mmol/L    CHLORIDE 104 98 - 107 mmol/L    IONIZED CALCIUM 1.09 (L) 1.15 - 1.33 mmol/L    GLUCOSE 33 (LL) 65 - 125 mg/dL    LACTATE >82.9 (HH) <=8.0 mmol/L   LACTIC ACID LEVEL W/ REFLEX FOR LEVEL >2.0   Result Value Ref Range    LACTIC ACID 19.2 (HH) 0.5 - 2.0 mmol/L   URINALYSIS, MACRO/MICRO   Result Value Ref Range    COLOR Dark Yellow Yellow, Dark Yellow, Light Yellow    APPEARANCE Cloudy (A) Clear, Slightly Cloudy    PH 5.0 5.0 - 7.0    SPECIFIC GRAVITY 1.018 1.005 - 1.025    PROTEIN 2+ (A) Negative mg/dL    NITRITE Negative Negative    GLUCOSE Negative Negative mg/dL    KETONES Trace (A) Negative mg/dL    BLOOD 2+ (A) Negative, Non-Hemolyzed Trace mg/dL    BILIRUBIN 2+ (A) Negative mg/dL    UROBILINOGEN 1.0 0.2 , 1.0 mg/dL    LEUKOCYTES Negative Negative WBCs/uL    RBCS 12.5 (H) <=3.0 /hpf    WBCS 2.3 <=4.0 /hpf    HYALINE CASTS Present (A) (none) /lpf    SQUAMOUS EPITHELIAL CELLS 3.5 0.0 - 10.0 /hpf    TOTAL CASTS 20.6 (H) <=2.0 /lpf    BACTERIA 153.2 (H) <=0 /hpf   C-REACTIVE PROTEIN (CRP)   Result Value Ref Range    C-REACTIVE PROTEIN (CRP) 8.5 (H) 0.0 - 0.9 mg/dL   CBC   Result Value Ref  Range    WBC 12.3 (H) 3.7 - 11.0 x10^3/uL    RBC 2.15 (L) 3.85 - 5.22 x10^6/uL    HGB 6.0 (LL) 11.5 - 16.0 g/dL    HCT 79.5 (L) 65.1 - 46.0 %    MCV 94.9 78.0 - 100.0 fL    MCH 27.9 26.0 - 32.0 pg    MCHC 29.4 (L) 31.0 - 35.5 g/dL    RDW-CV 83.4 (H) 88.4 - 15.5 %    PLATELETS 46 (L) 150 - 400 x10^3/uL    MPV 11.0 8.7 - 12.5 fL   COMPREHENSIVE METABOLIC PANEL, NON-FASTING   Result Value Ref Range    SODIUM 145 (H) 133 - 144 mmol/L    POTASSIUM 4.7 3.2 - 5.0 mmol/L    CHLORIDE 103 96 - 106 mmol/L    CO2 TOTAL 9 (LL) 22 - 30 mmol/L    ANION GAP 33 (H) 7 - 18 mmol/L    BUN 63 (H) 8 - 23 mg/dL    CREATININE 8.31 (H) 0.50 - 0.90 mg/dL    ESTIMATED GFR 33 (L) >90 mL/min/1.41m^2    ALBUMIN 2.7 (L) 3.5 - 5.2 g/dL    CALCIUM 8.6 8.3 - 89.2 mg/dL    GLUCOSE 41 (LL) 74 - 109 mg/dL    ALKALINE PHOSPHATASE 437 (H) 35 - 129 U/L    ALT (SGPT) 542 (H) 0 - 33 U/L    AST (SGOT) 1,060 (H) 0 - 32 U/L    BILIRUBIN TOTAL 5.9 (H) 0.2 - 1.2 mg/dL    PROTEIN TOTAL 4.8 (L) 6.4 - 8.3 g/dL   IONIZED CALCIUM WITH PH   Result Value Ref Range    PH (VENOUS) 7.08 (LL) 7.32 - 7.43    IONIZED CALCIUM 1.09 (L) 1.15 - 1.33 mmol/L   MAGNESIUM    Result Value Ref Range    MAGNESIUM  2.5 (H) 1.6 - 2.4 mg/dL   NT-PROBNP   Result Value Ref Range    NT-PROBNP 5,866 (  H) 0 - 125 pg/mL   PHOSPHORUS   Result Value Ref Range    PHOSPHORUS 6.6 (H) 2.5 - 4.5 mg/dL   PROCALCITONIN   Result Value Ref Range    PROCALCITONIN  79.80 (H) 0.00 - 0.50 ng/mL   PT/INR   Result Value Ref Range    PROTHROMBIN TIME 27.1 (H) 12.1 - 15.3 seconds    INR 2.52 (H) 0.86 - 1.14   PTT (PARTIAL THROMBOPLASTIN TIME)   Result Value Ref Range    APTT 37.9 (H) 22.4 - 34.8 seconds   URIC ACID   Result Value Ref Range    URIC ACID 15.5 (HH) 2.4 - 5.7 mg/dL   STREP PNEUMONIAE AND LEGIONELLA ANTIGEN, URINE    Specimen: Urine, Site not specified   Result Value Ref Range    S.PNEUMONIAE ANTIGEN Negative Negative, Indeterminate    LEGIONELLA ANTIGEN Negative Negative, Indeterminate   AMMONIA    Result Value Ref Range    AMMONIA 113 (HH) 11 - 51 umol/L   RESPIRATORY CULTURE AND GRAM STAIN (PERFORMABLE)    Specimen: Sputum   Result Value Ref Range    GRAM STAIN 4+ Many WBCs     GRAM STAIN 1+ Rare Yeast    CORTISOL, PLASMA OR SERUM   Result Value Ref Range    CORTISOL 76.4 ug/dL   FERRITIN   Result Value Ref Range    FERRITIN >2,000 (H) 13 - 150 ng/mL   TRIGLYCERIDE   Result Value Ref Range    TRIGLYCERIDES 103 0 - 150 mg/dL   LDH   Result Value Ref Range    LDH >2,500 (H) 135 - 214 U/L   POC BLOOD GLUCOSE (RESULTS)   Result Value Ref Range    GLUCOSE, POC 153 (H) 70 - 100 mg/dl   PRODUCT: FFP/PLASMA - UNITS : 2 Units   Result Value Ref Range    Coding System ISBT128     UNIT NUMBER [id number]     BLOOD COMPONENT TYPE THAWED PLASMA     UNIT DIVISION 00     UNIT DISPENSE STATUS ISSUED     TRANSFUSION STATUS OK TO TRANSFUSE     Product Code Z7315C99     Coding System ISBT128     UNIT NUMBER [id number]     BLOOD COMPONENT TYPE THAWED PLASMA     UNIT DIVISION 00     UNIT DISPENSE STATUS ISSUED     TRANSFUSION STATUS OK TO TRANSFUSE     Product Code Z4451C99    LACTIC ACID - FIRST REFLEX   Result Value Ref Range    LACTIC ACID 15.2 (HH) 0.5 - 2.0 mmol/L   CBC   Result Value Ref Range    WBC 11.3 (H) 3.7 - 11.0 x10^3/uL    RBC 2.28 (L) 3.85 - 5.22 x10^6/uL    HGB 6.6 (LL) 11.5 - 16.0 g/dL    HCT 78.4 (L) 65.1 - 46.0 %    MCV 94.3 78.0 - 100.0 fL    MCH 28.9 26.0 - 32.0 pg    MCHC 30.7 (L) 31.0 - 35.5 g/dL    RDW-CV 83.0 (H) 88.4 - 15.5 %    PLATELETS 45 (L) 150 - 400 x10^3/uL    MPV 11.1 8.7 - 12.5 fL   COMPREHENSIVE METABOLIC PANEL, NON-FASTING   Result Value Ref Range    SODIUM 149 (H) 133 - 144 mmol/L    POTASSIUM 3.8 3.2 - 5.0 mmol/L    CHLORIDE 103 96 - 106  mmol/L    CO2 TOTAL 14 (L) 22 - 30 mmol/L    ANION GAP 32 (H) 7 - 18 mmol/L    BUN 63 (H) 8 - 23 mg/dL    CREATININE 8.43 (H) 0.50 - 0.90 mg/dL    ESTIMATED GFR 36 (L) >90 mL/min/1.69m^2    ALBUMIN 2.6 (L) 3.5 - 5.2 g/dL    CALCIUM 9.1 8.3 - 89.2  mg/dL    GLUCOSE 807 (H) 74 - 109 mg/dL    ALKALINE PHOSPHATASE 370 (H) 35 - 129 U/L    ALT (SGPT) 599 (H) 0 - 33 U/L    AST (SGOT) 1,184 (H) 0 - 32 U/L    BILIRUBIN TOTAL 5.2 (H) 0.2 - 1.2 mg/dL    PROTEIN TOTAL 4.5 (L) 6.4 - 8.3 g/dL   IONIZED CALCIUM WITH PH   Result Value Ref Range    PH (VENOUS) 7.29 (L) 7.32 - 7.43    IONIZED CALCIUM 1.12 (L) 1.15 - 1.33 mmol/L   MAGNESIUM    Result Value Ref Range    MAGNESIUM  2.1 1.6 - 2.4 mg/dL   PHOSPHORUS   Result Value Ref Range    PHOSPHORUS 6.7 (H) 2.5 - 4.5 mg/dL   PT/INR   Result Value Ref Range    PROTHROMBIN TIME 24.4 (H) 12.1 - 15.3 seconds    INR 2.19 (H) 0.86 - 1.14   PTT (PARTIAL THROMBOPLASTIN TIME)   Result Value Ref Range    APTT 35.7 (H) 22.4 - 34.8 seconds   BLOOD GAS W/ CO-OX, LYTES, LACTATE REFLEX Arterial   Result Value Ref Range    %FIO2 (ARTERIAL) 100 %    PH (ARTERIAL) 7.24 (L) 7.35 - 7.45    PCO2 (ARTERIAL) 35 35 - 45 mm/Hg    PO2 (ARTERIAL) 453 (H) 83 - 108 mm/Hg    BICARBONATE (ARTERIAL) 16.1 (L) 21.0 - 28.0 mmol/L    BASE DEFICIT 11.4 (H) 0.0 - 3.0 mmol/L    PAO2/FIO2 RATIO 453     O2 SATURATION (ARTERIAL) 100.0 94.0 - 98.0 %    HEMOGLOBIN 9.3 (L) 12.0 - 18.0 g/dL    HEMATOCRITRT 28 (L) 37 - 50 %    OXYHEMOGLOBIN 98.3 (H) 90.0 - 95.0 %    CARBOXYHEMOGLOBIN 1.7 <=3.0 %    MET-HEMOGLOBIN <0.7 <=1.5 %    O2CT 14.1 %    SODIUM 144 136 - 145 mmol/L    WHOLE BLOOD POTASSIUM 3.7 3.5 - 5.1 mmol/L    CHLORIDE 104 98 - 107 mmol/L    IONIZED CALCIUM 1.14 (L) 1.15 - 1.33 mmol/L    GLUCOSE 172 (H) 65 - 125 mg/dL    LACTATE 85.1 (HH) <=8.0 mmol/L   POC BLOOD GLUCOSE (RESULTS)   Result Value Ref Range    GLUCOSE, POC 220 (H) 70 - 100 mg/dl   PRODUCT: FFP/PLASMA - UNITS : 2 Units   Result Value Ref Range    Coding System ISBT128     UNIT NUMBER [id number]     BLOOD COMPONENT TYPE THAWED PLASMA     UNIT DIVISION 00     UNIT DISPENSE STATUS ISSUED     TRANSFUSION STATUS OK TO TRANSFUSE     Product Code Z7315C99     Coding System ISBT128     UNIT NUMBER  [id number]     BLOOD COMPONENT TYPE THAWED PLASMA     UNIT DIVISION 00     UNIT DISPENSE STATUS ISSUED     TRANSFUSION STATUS OK TO TRANSFUSE  Product Code [phone]    PRODUCT: PLATELETS - UNITS , 1 Units   Result Value Ref Range    Coding System ISBT128     UNIT NUMBER [id number]     BLOOD COMPONENT TYPE PATHOGEN REDUCED PLASMA REDUCED LOW VOLUME2     UNIT DIVISION 00     UNIT DISPENSE STATUS ISSUED     TRANSFUSION STATUS OK TO TRANSFUSE     Product Code Z0860C99    POC BLOOD GLUCOSE (RESULTS)   Result Value Ref Range    GLUCOSE, POC 289 (H) 70 - 100 mg/dl   LACTIC ACID - SECOND REFLEX   Result Value Ref Range    LACTIC ACID 7.1 (HH) 0.5 - 2.0 mmol/L   CBC   Result Value Ref Range    WBC 6.7 3.7 - 11.0 x10^3/uL    RBC 2.88 (L) 3.85 - 5.22 x10^6/uL    HGB 8.3 (L) 11.5 - 16.0 g/dL    HCT 74.3 (L) 65.1 - 46.0 %    MCV 88.9 78.0 - 100.0 fL    MCH 28.8 26.0 - 32.0 pg    MCHC 32.4 31.0 - 35.5 g/dL    RDW-CV 85.1 88.4 - 84.4 %    PLATELETS 58 (L) 150 - 400 x10^3/uL    MPV 10.9 8.7 - 12.5 fL   COMPREHENSIVE METABOLIC PANEL, NON-FASTING   Result Value Ref Range    SODIUM 149 (H) 133 - 144 mmol/L    POTASSIUM 3.4 3.2 - 5.0 mmol/L    CHLORIDE 101 96 - 106 mmol/L    CO2 TOTAL 24 22 - 30 mmol/L    ANION GAP 24 (H) 7 - 18 mmol/L    BUN 59 (H) 8 - 23 mg/dL    CREATININE 8.49 (H) 0.50 - 0.90 mg/dL    ESTIMATED GFR 38 (L) >90 mL/min/1.56m^2    ALBUMIN 3.1 (L) 3.5 - 5.2 g/dL    CALCIUM 9.3 8.3 - 89.2 mg/dL    GLUCOSE 680 (H) 74 - 109 mg/dL    ALKALINE PHOSPHATASE 302 (H) 35 - 129 U/L    ALT (SGPT) 743 (H) 0 - 33 U/L    AST (SGOT) 1,532 (H) 0 - 32 U/L    BILIRUBIN TOTAL 5.0 (H) 0.2 - 1.2 mg/dL    PROTEIN TOTAL 5.3 (L) 6.4 - 8.3 g/dL   IONIZED CALCIUM WITH PH   Result Value Ref Range    PH (VENOUS) 7.35 7.32 - 7.43    IONIZED CALCIUM 1.12 (L) 1.15 - 1.33 mmol/L   MAGNESIUM    Result Value Ref Range    MAGNESIUM  1.9 1.6 - 2.4 mg/dL   PHOSPHORUS   Result Value Ref Range    PHOSPHORUS 6.4 (H) 2.5 - 4.5 mg/dL   PT/INR   Result  Value Ref Range    PROTHROMBIN TIME 21.4 (H) 12.1 - 15.3 seconds    INR 1.84 (H) 0.86 - 1.14   PTT (PARTIAL THROMBOPLASTIN TIME)   Result Value Ref Range    APTT 38.0 (H) 22.4 - 34.8 seconds   FIBRINOGEN   Result Value Ref Range    FIBRINOGEN 333 251 - 576 mg/dL   PRODUCT: FFP/PLASMA - UNITS : 2 Units   Result Value Ref Range    Coding System ISBT128     UNIT NUMBER [id number]     BLOOD COMPONENT TYPE THAWED PLASMA     UNIT DIVISION 00     UNIT DISPENSE STATUS ALLOCATED     TRANSFUSION STATUS OK TO  TRANSFUSE     Product Code [phone]     Coding System ISBT128     UNIT NUMBER [id number]     BLOOD COMPONENT TYPE THAWED PLASMA     UNIT DIVISION 00     UNIT DISPENSE STATUS ALLOCATED     TRANSFUSION STATUS OK TO TRANSFUSE     Product Code Z7315C99    PRODUCT: PLATELETS - UNITS , 1 Units   Result Value Ref Range    Coding System ISBT128     UNIT NUMBER [id number]     BLOOD COMPONENT TYPE PATHOGEN REDUCED PLASMA REDUCED PHER     UNIT DIVISION 00     UNIT DISPENSE STATUS ISSUED     TRANSFUSION STATUS OK TO TRANSFUSE     Product Code E8340V00    TRANSTHORACIC ECHOCARDIOGRAM - ADULT   Result Value Ref Range    EF VISUAL ESTIMATE 55     EF 60    POC BLOOD GLUCOSE (RESULTS)   Result Value Ref Range    GLUCOSE, POC 294 (H) 70 - 100 mg/dl   CBC   Result Value Ref Range    WBC 6.9 3.7 - 11.0 x10^3/uL    RBC 2.45 (L) 3.85 - 5.22 x10^6/uL    HGB 7.1 (L) 11.5 - 16.0 g/dL    HCT 78.4 (L) 65.1 - 46.0 %    MCV 87.8 78.0 - 100.0 fL    MCH 29.0 26.0 - 32.0 pg    MCHC 33.0 31.0 - 35.5 g/dL    RDW-CV 84.9 88.4 - 84.4 %    PLATELETS 42 (L) 150 - 400 x10^3/uL    MPV 10.8 8.7 - 12.5 fL   COMPREHENSIVE METABOLIC PANEL, NON-FASTING   Result Value Ref Range    SODIUM 149 (H) 133 - 144 mmol/L    POTASSIUM 3.3 3.2 - 5.0 mmol/L    CHLORIDE 103 96 - 106 mmol/L    CO2 TOTAL 27 22 - 30 mmol/L    ANION GAP 19 (H) 7 - 18 mmol/L    BUN 59 (H) 8 - 23 mg/dL    CREATININE 8.44 (H) 0.50 - 0.90 mg/dL    ESTIMATED GFR 36 (L) >90 mL/min/1.31m^2     ALBUMIN 3.1 (L) 3.5 - 5.2 g/dL    CALCIUM 9.4 8.3 - 89.2 mg/dL    GLUCOSE 793 (H) 74 - 109 mg/dL    ALKALINE PHOSPHATASE 310 (H) 35 - 129 U/L    ALT (SGPT) 1,025 (H) 0 - 33 U/L    AST (SGOT) 2,418 (H) 0 - 32 U/L    BILIRUBIN TOTAL 5.6 (H) 0.2 - 1.2 mg/dL    PROTEIN TOTAL 5.2 (L) 6.4 - 8.3 g/dL   IONIZED CALCIUM WITH PH   Result Value Ref Range    PH (VENOUS) 7.46 (H) 7.32 - 7.43    IONIZED CALCIUM 1.19 1.15 - 1.33 mmol/L   MAGNESIUM    Result Value Ref Range    MAGNESIUM  1.9 1.6 - 2.4 mg/dL   PHOSPHORUS   Result Value Ref Range    PHOSPHORUS 4.5 2.5 - 4.5 mg/dL   PT/INR   Result Value Ref Range    PROTHROMBIN TIME 20.8 (H) 12.1 - 15.3 seconds    INR 1.77 (H) 0.86 - 1.14   PTT (PARTIAL THROMBOPLASTIN TIME)   Result Value Ref Range    APTT 38.7 (H) 22.4 - 34.8 seconds   FIBRINOGEN   Result Value Ref Range    FIBRINOGEN 305 251 - 576 mg/dL  TEG, RAPID GLOBAL WITH LYSIS (TRAUMA)   Result Value Ref Range    R (CK) >17.0 (H) 4.6 - 9.1 min    LYS30 (%) 0.0 0.0 - 2.6 %    MA (CRT RAPID) 47.3 (L) 52.0 - 70.0 mm    MA FIBRINOGEN (CFF) 19.1 15.0 - 32.0 mm   BLOOD GAS W/ CO-OX, LYTES, LACTATE REFLEX Arterial   Result Value Ref Range    %FIO2 (ARTERIAL) 50 %    PH (ARTERIAL) 7.41 7.35 - 7.45    PCO2 (ARTERIAL) 44 35 - 45 mm/Hg    PO2 (ARTERIAL) 107 83 - 108 mm/Hg    BICARBONATE (ARTERIAL) 27.2 21.0 - 28.0 mmol/L    BASE EXCESS (ARTERIAL) 2.9 0.0 - 3.0 mmol/L    PAO2/FIO2 RATIO 214     O2 SATURATION (ARTERIAL) 98.2 94.0 - 98.0 %    HEMOGLOBIN 9.0 (L) 12.0 - 18.0 g/dL    HEMATOCRITRT 27 (L) 37 - 50 %    OXYHEMOGLOBIN 97.9 (H) 90.0 - 95.0 %    CARBOXYHEMOGLOBIN 1.4 <=3.0 %    MET-HEMOGLOBIN <0.7 <=1.5 %    O2CT 12.6 %    SODIUM 143 136 - 145 mmol/L    WHOLE BLOOD POTASSIUM 3.3 (L) 3.5 - 5.1 mmol/L    CHLORIDE 105 98 - 107 mmol/L    IONIZED CALCIUM 1.22 1.15 - 1.33 mmol/L    GLUCOSE 198 (H) 65 - 125 mg/dL    LACTATE 5.9 (HH) <=8.0 mmol/L   LACTIC ACID LEVEL W/ REFLEX FOR LEVEL >2.0   Result Value Ref Range    LACTIC ACID 6.2 (HH)  0.5 - 2.0 mmol/L   POC BLOOD GLUCOSE (RESULTS)   Result Value Ref Range    GLUCOSE, POC 176 (H) 70 - 100 mg/dl       Radiology Results:   Results for orders placed or performed during the hospital encounter of 07/08/24 (from the past 72 hours)   CT BRAIN WO IV CONTRAST     Status: Abnormal    Narrative    Davette JEAN Deloney    CT BRAIN WO IV CONTRAST performed on 07/15/2024 8:16 AM.    INDICATION:  Fall   Additional History:  fall, nausea, weakness    TECHNIQUE:  Unenhanced CT head with axial, coronal, and sagittal multiplanar reformations. Dose modulation, automated exposure control, and/or iterative reconstruction were used for dose reduction.    COMPARISON: June 14, 2024    FINDINGS:  There is a small amount of acute blood products in the subdural space overlying the left frontal lobe anteriorly and inferiorly. This measures 6 mm in thickness. A small collection of blood in the subdural space along the high convexity of the left frontal lobe is also seen. There is very mild mass effect on the left frontal lobe with no significant effacement of the ventricular system, edema or midline shift.    Elsewhere, the brain, ventricles and subarachnoid spaces are within normal limits. There is no evidence of acute large vessel infarct. No acute blood products are seen elsewhere. The calvarium appears intact.              Impression    Small volume of acute hemorrhage in the subdural space overlying the left frontal lobe anteriorly and superiorly as detailed above. No significant change otherwise seen.  .  .  .  s/d/g    A Critical Red actionable finding has been sent via the [employer] Actionable Findings application on 07/15/2024 8:26 AM, Message  ID [id number]. Receipt of this communication will be communicated to [facility] RADIOLOGY STAFF or responsible provider and will be documented in [employer] Actionable Findings System upon receiving the acknowledgement.    A Critical Red actionable finding has been sent via the  [employer] Actionable Findings application on 07/15/2024 8:26 AM, Message ID [id number]. Receipt of this communication will be communicated to [facility] RADIOLOGY STAFF or responsible provider and will be documented in [employer] Actionable Findings System upon receiving the acknowledgement.      Radiologist location ID: [id number]     CT BRAIN WO IV CONTRAST     Status: None    Narrative    Vanshika JEAN Eckford    CT BRAIN WO IV CONTRAST performed on 07/16/2024 6:33 AM.    INDICATION:  Subdural hematoma   Additional History:  Subdural hematoma    TECHNIQUE:  Unenhanced CT head with axial, coronal, and sagittal multiplanar reformations. Dose modulation, automated exposure control, and/or iterative reconstruction were used for dose reduction.    COMPARISON: Previous day    FINDINGS:  Small amount of acute blood in the subdural space overlying the left frontal lobe anteriorly and also superiorly is stable in appearance. No significant mass effect or midline shift.    No acute blood products are seen elsewhere. No adverse interval change is seen.              Impression    Stable exam  .  .  .  s/d/g        Radiologist location ID: [id number]     NUC CHOLESCINTIGRAPHY HEPATO IMAGING WO EF     Status: None    Narrative    Nadiya JEAN Folson    NUC CHOLESCINTIGRAPHY HEPATO SYSTEM WITHOUT PHARM performed on 07/16/2024 6:44 PM.    INDICATION:  Increased bilirubin s/p cholecystectomy   Additional History:  abdominal pain, recent cholecystectomy, elevated liver enzymes    TECHNIQUE:  Routine hepatobiliary scan performed with sequential anterior planar imaging of the abdomen.    RADIOPHARMACEUTICAL:  6.9mCi of Tc99m Mebrofenin IV    * Anterior images extending to 4 hours were completed.  * Activity is demonstrated throughout the liver which appears dominant in size, difficult to determine by nuclear medicine study, however enlargement CT 07/13/2024.  * There is marked delay in activity within the biliary system, demonstrated on the 2  and 4 hour delayed images, indicating hepatocellular dysfunction, clinical assessment required.  * 4 hour image reveals subtle increased activity lateral to the lower right hepatic lobe, extending into the right abdomen and pelvis, in the expected region of the paracolic gutter. This is certainly worrisome for bile duct leak.      Impression    * IMPRESSION:    * Study indicating hepatic dysfunction with delay in activity within the biliary system, activity within the expected region of the right paracolic gutter on 4 hour study is worrisome for but not clearly diagnostic of a bile duct leak, ERCP with positive contrast evaluation may certainly be of value      Radiologist location ID: [id number]     XR CHEST AP     Status: None    Narrative    Altheria JEAN Birge    XR CHEST AP performed on 07/17/2024 1:33 AM.    INDICATION:  hypoxia   Additional History:  central line placement check; hypoxia    TECHNIQUE:  Single frontal view of the chest.  1 views/1 images submitted for interpretation.    COMPARISON:  Chest radiograph 07/08/2024  ___________________________________  FINDINGS:    Right IJ central venous catheter terminates at the lower SVC. Sternal plates and wires appear intact.  Cardiomediastinal silhouette is normal in size. Atherosclerotic calcification of the aortic knob.  Known right hilar adenopathy. Negative for pleural effusion. Biapical predominant coarsened interstitial markings likely reflecting chronic parenchymal changes. Negative for pneumothorax.  ___________________________________    Impression    1. Right IJ central venous catheter in appropriate position.  2. Chronic interstitial changes similar to prior radiograph; superimposed infectious process difficult to exclude.      Radiologist location ID: [id number]     CT BRAIN WO IV CONTRAST     Status: None    Narrative    Mariangela JEAN Spraggins    CT BRAIN WO IV CONTRAST performed on 07/17/2024 3:13 AM.    INDICATION:  known SDH, AMS   Additional  History:  known SDH, AMS, mechanical ventilation    TECHNIQUE:  Unenhanced CT head with axial, coronal, and sagittal multiplanar reformations. Dose modulation, automated exposure control, and/or iterative reconstruction were used for dose reduction.    COMPARISON:  CT 07/16/2024  ___________________________________  FINDINGS:    Small volume extra-axial subdural hemorrhage along the anterior and superior left frontal lobe measuring 4 mm (series 10, image 21) and 3 mm (series 10, image 25) respectively not changed from prior CT. Negative for new or increasing blood products. Negative for midline shift.  The ventricles are within normal limits for size, configuration, and symmetry.     The grey-white differentiation is well preserved throughout, with no evidence of acute infarct.    Brain parenchymal volume is appropriate for age. Mild periventricular patchy hypoattenuation reflecting sequela chronic microvascular ischemic change.    Osseous structures are unremarkable.  Left maxillary and scattered ethmoid air cell mucosal thickening. Remaining paranasal sinuses are clear.  Mastoid air cells are clear.   ___________________________________    Impression    Small-volume left-sided subdural hemorrhage similar to prior CT. Negative for new or increasing blood products.        Radiologist location ID: [id number]     CT CHEST ABDOMEN PELVIS WO IV CONTRAST     Status: Abnormal    Narrative    Kameela JEAN Chisholm    CT CHEST ABDOMEN PELVIS WO IV CONTRAST performed on 07/17/2024 3:15 AM.    INDICATION:  AMS, lethargy, hypotension   Additional History:  AMS, lethargy, hypotension, mechanical ventilation, history of CABG    TECHNIQUE:  CT chest, abdomen, and pelvis with axial, coronal, and sagittal multiplanar reformations, performed without contrast. Dose modulation, automated exposure control, and/or iterative reconstruction were used for dose reduction.    COMPARISON:  CT 07/08/2024, CT 07/12/2024, CT  07/13/2024.  ___________________________________  FINDINGS:    CT CHEST:    LUNGS / AIRWAYS: Groundglass opacities of the right upper and left upper lobes as well as the lingula which are new from 07/08/2024 CT. Right perihilar mass is poorly defined given contiguity with the mediastinum on this noncontrast examination, however mass appears grossly similar in size to 07/08/2024 CT. Increasing interstitial prominence at the peripheral aspect of the mass (series 3, image 32).    HEART / GREAT VESSELS: Heart is stable in size. Thoracic aorta is unremarkable. Atherosclerotic calcification of the coronary arteries is severe.    MEDIASTINUM: Confluent right hilar and mediastinal lymphadenopathy appears similar to 07/08/2024 CT of the structures otherwise poorly evaluated on noncontrast examination. Negative for axillary lymphadenopathy.    OTHER:  The  visualized thyroid gland is unremarkable.  Endotracheal tube terminates above the carina but insinuates toward the right mainstem bronchus (series 6, image 53). Enteric tube terminates in the stomach.    CT ABDOMEN/PELVIS:    HEPATOBILIARY:  Hepatomegaly measuring approximately 21.6 cm in maximum craniocaudal dimension. Mild periportal edema. Negative for apparent focal liver lesion, limited in evaluation on this noncontrast examination.  Hematoma in the gallbladder fossa not changed from 07/13/2024 CT.  Negative for apparent intrahepatic biliary ductal dilation. Extrahepatic bile duct are well-visualized.  PANCREAS: No focal masses or ductal dilatation.  SPLEEN: No splenomegaly.    ADRENALS: No adrenal nodules.  KIDNEYS/URETERS/BLADDER: Negative for hydroureteronephrosis. Multiple nonobstructing calculi bilaterally similar to prior CT. Urinary bladder is decompressed, limited for evaluation.  REPRODUCTIVE: Negative for acute abnormality.    PERITONEUM / RETROPERITONEUM: Large volume hemoperitoneum which is predominantly low attenuation with layering increased attenuation in the  pelvic cul-de-sac increased in volume from 07/13/2024 CT. Negative for pneumoperitoneum.  LYMPH NODES: No lymphadenopathy.  VESSELS: Abdominal aorta is normal in caliber.    GI TRACT: No distention, wall thickening, or inflammatory stranding.  The appendix is not seen, but there are no secondary signs of appendicitis.  ABDOMINAL WALL:  Postsurgical change of the ventral abdominal wall. Few subcutaneous hematomas in the right ventral abdominal wall not changed from prior CT.    SOFT TISSUES: Unremarkable.    CT BONES:  Negative for acute displaced fracture or dislocation.  ___________________________________    Impression    1. Large volume hemoperitoneum, increased from 07/13/2024 CT. Persistent hyperattenuating material in the gallbladder fossa. Correlate with concern for ongoing extravasation.  2. Groundglass opacities in the right greater than left upper lobes new from prior CT suspicious for developing multifocal infectious process.   3. Endotracheal tube terminates above the carina but insinuates towards the right mainstem bronchus; consider slight retraction. Enteric tube terminating in the gastric body.  4. Right infrahilar mass poorly delineated on noncontrast examination, similar in size to 07/08/2024 CT. Increasing adjacent groundglass opacity and nodularity which could represent infectious/inflammatory process versus progression.  5. Additional chronic findings as above.      A Critical Red actionable finding has been sent via the [employer] Actionable Findings application on 07/17/2024 3:57 AM, Message ID [id number]. Receipt of this communication will be communicated to [facility] RADIOLOGY STAFF or responsible provider and will be documented in [employer] Actionable Findings System upon receiving the acknowledgement.      Radiologist location ID: [id number]            ASSESSMENT:    Active Hospital Problems    Diagnosis    Primary Problem: Acute hypoxic respiratory failure (CMS HCC)    Subdural hematoma (CMS  HCC)    NSVT (nonsustained ventricular tachycardia) (CMS HCC)    Heart failure, unspecified HF chronicity, unspecified heart failure type (CMS HCC)    Acute cholecystitis    Thickening of wall of gallbladder with pericholecystic fluid    Symptomatic anemia    COVID-19    Acute blood loss anemia    Transaminitis    Malignant neoplasm of lung, unspecified laterality, unspecified part of lung (CMS HCC)    Pulmonary HTN (CMS HCC)    Pneumonia due to COVID-19 virus    Mood disorder (CMS HCC)    Anemia, unspecified type    Cigarette smoker    Metastatic cancer to liver    History of breast cancer    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 Patient has had significant bleeding in the last 24 hours with large hemoperitoneum and required several blood products and IV pressors. HIDA Scan showed some delay in activity in biliary system but no clearly diagnostic of bile duct leak. Tbili and LFTs elevations could be multifactorial with significant hemoperitoneum with hypotension, ischemic, viral/infection, tumor burden from liver metastasis. [name] too unstable to invasive procedures like ERCP at this time. Would hold off since HIDA scan not definitive for bile duct leak and re-evaluate once patient is more stable. Can consider IR drain placement. Surgery following. Nursing denies any overt GI bleeding. Continue to monitor labs. GI will be available as needed.       I independently of the attending provider spent a total of (35) minutes in direct/indirect care of this patient including initial evaluation, review of laboratory, radiology, diagnostic studies, review of medical record, order entry and coordination of care.     Rosina Talbert Ng, APRN, CNP    Recalled due to concern for bile duct leak on recent  HIDA Scan and elevated Tbili and LFTs. Patient has had significant bleeding in the last 24 hours with large hemoperitoneum and required several blood products and IV pressors. HIDA Scan showed possible bile leak. Tbili and LFTs elevations could be multifactorial with significant hemoperitoneum with hypotension, ischemic, viral/infection, tumor burden from liver metastasis,rhabdomyolysis. [name] too unstable to invasive procedures like ERCP at this time. Would hold off since HIDA scan not definitive for bile duct leak and re-evaluate once patient is more stable. Can consider IR drain placement at this moment. Surgery following. Nursing denies any overt GI bleeding. Continue to monitor labs. GI will be available as needed.   Check CK,Troponin with am labs  Can consider [facility] for INR reversal.    Swara Donze M.D.  GI    Duration of visit 35 minutes

## 2024-07-17 NOTE — Ancillary Notes (Signed)
 Reviewed overnight events.  Patient decompensated overnight, requiring intubation.  Patient also required 4 pressors.  She was transfused with PRBCs overnight.  She was started on broad-spectrum antibiotics, stress dose steroids and bicarb drip for severe metabolic acidosis.    Patient was seen and examined at bedside this morning, patient's daughters at bedside.  She on mechanical ventilator and sedated, on 2 pressors.  CT abdomen/pelvis done this morning showed large volume hemoperitoneum, increased from 07/13/2024, ground-glass opacities in right greater than left upper lobes.  General surgery is following, did not recommend any surgical intervention    Discussed with Dr. Sherre, signed off patient's care to ICU team.

## 2024-07-17 NOTE — Nurses Notes (Signed)
 Restraint Continuation      Patient continues to have the following condition INTUBATED/RESTLESS.       Least restrictive alternatives attempted : concealed tubes/lines, decreased/removed stimulus, and assessed meds/medical problems    Patient continues to exhibit the following behaviors: uncooperative    The restraint continued to facilitate medical/surgical treatment to ensure safety.    The patient will continue to be evaluated and assessments documented on the flowsheet to ensure that the patient is released from the restraint at the earliest possible time

## 2024-07-17 NOTE — Procedures (Signed)
 Arterial Catheter Insertion Procedure Note    Indications: Hypotension, Titration of vasoactive medication, and Frequent blood sampling    Surgeon: Wilkie Orchard, APRN, CNP     Procedure Details   Informed consent was obtained for the procedure, including possible sedation.  Risks of  hemorrhage, infection, arterial occlusion, and possible limb or digit ischemia were discussed.       Time-Out Process performed, verified patient identification, verified procedure, verified site/side, verified correct patient position, special equipment/implants available.    Under sterile conditions (hand hygiene prior to donning gloves, gown, mask, sterile  drape), the skin above the right femoral artery was prepped with chlorhexidine. Local anesthesia 1st infiltrated into the skin and subcutaneous tissues.  A 21-gauge needle was inserted into the artery. A guide wire was easily inserted through the needle. The needle was withdrawn over the guide wire.  The arterial catheter was advanced over the guide wire and the guide wire was removed.  The arterial catheter was connected to the pressure tubing for the system.  The catheter was flushed and the tracing was adequate.  The catheter was sutured, a bio-patch was placed and a transparent dressing was applied.      A modified Allen's Test was performed prior to placing the radial arterial catheter to assure adequate collateral blood flow.    Complications:  None; patient tolerated the procedure well.           Condition: stable.    This procedure was performed independently of the time included in today's admission/progress note(s).      Dr. Francis was immediately available for the entire procedure by phone or pager    Wilkie Orchard, APRN, CNP

## 2024-07-17 NOTE — Consults (Addendum)
 [facility] MEDICINE [facility]  [address]  [place] [address]     Consult  Follow Up Note    Le, Ferraz, 68 y.o. female  Date of Birth:  10-Nov-1955  Encounter Start Date:  07/08/2024  Inpatient Admission Date: 07/08/2024   Date of service: 07/17/24     Requesting MD: No ref. provider found     Reason for consultation: Acute cholecystitis    HPI:  [name] is a 68 y.o. female who was sent to the emergency department from infusion Center where she had presented to start chemotherapy.  She has a recent diagnosis of small cell carcinoma with liver Mets.  She complained of dyspnea and hypoxia and was diagnosed with COVID.  CT abdomen and pelvis with findings of hepatomegaly, hepatic steatosis and hepatic lesions, new pericholecystic fluid.  General surgery consulted for evaluation.    Subjective:  Patient remains in intensive care unit, family at bedside. Patient decompensated overnight requiring emergent intubation, and multiple pressors, received multiple blood products. [name] postop day 6 for robotic cholecystectomy.  Repeat CT scan overnight with concerns of the large volume hemoperitoneum increased from 07/13/2024.  Abdomen is soft, distended, very hypoactive bowel sounds. Right lower quadrant trocar site with 2 sutures without further active bleeding.      Historical Data   Past Medical History:   Diagnosis Date    Abdominal pain     Cancer (CMS HCC)     Chest pain     Chronic lung disease     Coronary artery disease     Dyslipidemia     Essential hypertension     Heart disease     History of percutaneous coronary intervention     History of stress test     Mixed hyperlipidemia     Unspecified chronic bronchitis          Past Surgical History:   Procedure Laterality Date    CARDIAC CATHETERIZATION      CORONARY ANGIOPLASTY      CORONARY ARTERY STENT PLACEMENT      HX BACK SURGERY      HX CORONARY ARTERY BYPASS GRAFT      HX MASTECTOMY, SIMPLE Bilateral      HX TUBAL LIGATION      NECK SURGERY           Allergies[1]    Social History  Social History[2]         Current Outpatient Medications   Medication Instructions    anastrozole  (ARIMIDEX ) 1 mg, Daily    aspirin  81 mg, Oral, Daily    atorvastatin  (LIPITOR) 40 mg, Oral, Daily    cholecalciferol  (vitamin D3) 1,000 Units, Daily    clonazePAM  (KLONOPIN ) 0.5 mg, 2 TIMES DAILY PRN    DULoxetine  (CYMBALTA  DR) 60 mg, Daily    fluticasone  propion-salmeteroL (ADVAIR ) 250-50 mcg/dose Inhalation oral diskus inhaler 1 Inhalation, Inhalation, 2 TIMES DAILY    fluticasone  propionate (FLONASE ) 50 mcg/actuation Nasal Spray, Suspension 1 Spray, DAILY PRN    gabapentin  (NEURONTIN ) 400 mg, Oral, 3 TIMES DAILY    losartan  (COZAAR ) 25 mg, Oral, Daily    metoprolol  tartrate (LOPRESSOR ) 25 mg, 2 TIMES DAILY    oxyCODONE -acetaminophen  (PERCOCET) 7.5-325 mg Oral Tablet 1 Tablet, Oral, EVERY 4 HOURS PRN    prochlorperazine  (COMPAZINE ) 10 mg, Oral, 3 TIMES DAILY PRN    promethazine  (PHENERGAN ) 25 mg, Oral, EVERY 8 HOURS PRN    traZODone  (DESYREL ) 150 mg, NIGHTLY  albuterol  (PROVENTIL ) 2.5 mg / 3 mL (0.083%) neb solution, 2.5 mg, Nebulization, 4x/day  aluminum-magnesium  hydroxide-simethicone (MAG-AL PLUS) 200-200-20 mg per 5 mL oral liquid, 30 mL, Oral, Q4H PRN  anastrozole  (ARIMIDEX ) tablet, 1 mg, Oral, Daily  budesonide (PULMICORT RESPULES) 0.5 mg/2 mL nebulizer suspension, 1 mg, Nebulization, 2x/day   And  arformoterol (BROVANA) 15 mcg/2 mL nebulizer solution, 15 mcg, Nebulization, 2x/day  [Held by provider] atorvastatin  (LIPITOR) tablet, 40 mg, Oral, Daily  chlorhexidine gluconate (PERIDEX) 0.12% mouthwash, 15 mL, Swish & Spit, 2x/day  cholecalciferol  (VITAMIN D3) 1000 unit (25 mcg) tablet, 1,000 Units, Oral, Daily  [Held by provider] clonazePAM  (klonoPIN ) tablet, 0.5 mg, Oral, 2x/day PRN  Correction/SSIP insulin  lispro 100 units/mL injection, 1-5 Units, Subcutaneous, Q4HRS  NS 250 mL flush bag, , Intravenous, Q15 Min PRN   And  D5W 250  mL flush bag, , Intravenous, Q15 Min PRN  dexmedeTOMIDine  (PRECEDEX ) 400 mcg in D5W 100 mL premix infusion, 0-1.5 mcg/kg/hr (Adjusted), Intravenous, Continuous  dextrose  (GLUTOSE) 40% oral gel, 15 g, Oral, Q15 Min PRN  dextrose  50% (0.5 g/mL) injection - syringe, 12.5 g, Intravenous, Q15 Min PRN  DULoxetine  (CYMBALTA ) delayed release capsule, 60 mg, Oral, Daily  EPINEPHrine  (ADRENALIN ) 10 mg in NS 250 mL infusion, 0-0.3 mcg/kg/min, Intravenous, Continuous  fentaNYL (SUBLIMAZE) 50 mcg/mL injection, 100 mcg, Intravenous, Once  fentaNYL (SUBLIMAZE) 50 mcg/mL injection, 50 mcg, Intravenous, Q15 Min PRN  fentaNYL (SUBLIMAZE) 50 mcg/mL IV infusion, 0-3 mcg/kg/hr (Adjusted), Intravenous, Continuous  folic acid  (FOLVITE ) tablet, 1 mg, Oral, Daily  glucagon  (GLUCAGEN) injection 1 mg, 1 mg, IntraMUSCULAR, Once PRN  hydrocortisone (solu-CORTEF) 50 mg/mL injection, 50 mg, Intravenous, Q6H  [Held by provider] HYDROmorphone  (DILAUDID ) 0.5 mg/0.5 mL injection, 0.5 mg, Intravenous, Q4H PRN  [Held by provider] losartan  (COZAAR ) tablet, 25 mg, Oral, Daily  [Held by provider] metoprolol  tartrate (LOPRESSOR ) tablet, 25 mg, Oral, 2x/day  midazolam (VERSED) 1 mg/mL injection, 2 mg, Intravenous, Q1H PRN  multivitamin-minerals-iron  oral liquid, 15 mL, Oral, Daily  norepinephrine  (LEVOPHED ) 4 mg in D5W 250 mL premix infusion (16 mcg/mL), 0-0.3 mcg/kg/min, Intravenous, Continuous  NS ---Cabinet Override with EPINEPHrine  (ADRENALIN ) 1 mg/mL ---Cabinet Override ---Cabinet Override, , ,   NS flush syringe, 3 mL, Intracatheter, Q8HRS  NS flush syringe, 3 mL, Intracatheter, Q1H PRN  [Held by provider] OLANZapine  (zyPREXA ) tablet, 5 mg, Oral, NIGHTLY  omega-3 fatty acids (LOVAZA ) capsule, 1 g, Oral, Daily  pantoprazole  (PROTONIX ) 4 mg/mL injection, 40 mg, Intravenous, Q12H  phenylephrine  (NEO-SYNEPHRINE) 10 mg/mL injection ---Cabinet Override, , ,   phenylephrine  (NEO-SYNEPHRINE) 100 mg in NS 250 mL infusion, 0-5 mcg/kg/min, Intravenous,  Continuous  piperacillin-tazobactam (ZOSYN) 4.5 g in iso-osmotic 100 mL premix IVPB, 4.5 g, Intravenous, Q8H  polyethylene glycol (MIRALAX) oral packet, 17 g, Oral, Daily  potassium chloride  20 mEq in SW 100 mL premix infusion, 20 mEq, Intravenous, Once  sodium bicarbonate 8.4 % (1 mEq/mL) injection ---Cabinet Override, , ,   SW 1,000 mL with sodium bicarbonate 150 mEq infusion, , Intravenous, Continuous  Vancomycin IV - Pharmacist to Dose per Protocol, , Does not apply, Daily PRN  Vancomycin IV Intermittent Dosing, , Does not apply, Daily PRN  vasopressin (VASOSTRICT) 20 Units in NS 100 mL (0.2 units/mL) infusion, 0.03 Units/min, Intravenous, Continuous        Review of Systems:   All pertinent review of systems as addressed and detailed in HPI above    Vital Signs:  Patient Vitals for the past 24 hrs:   BP Temp Pulse Resp SpO2  07/17/24 1215 -- -- 84 12 96 %   07/17/24 1212 -- -- 84 12 96 %   07/17/24 1200 -- -- 83 12 97 %   07/17/24 1145 -- -- 83 12 100 %   07/17/24 1130 -- -- 82 13 98 %   07/17/24 1115 -- -- 83 13 97 %   07/17/24 1100 -- -- 83 12 97 %   07/17/24 1045 -- -- 84 13 97 %   07/17/24 1030 -- -- 84 12 97 %   07/17/24 1015 -- -- 85 13 97 %   07/17/24 1000 -- -- 84 12 98 %   07/17/24 0945 -- -- 86 12 97 %   07/17/24 0930 -- -- 85 14 98 %   07/17/24 0915 -- -- 85 16 98 %   07/17/24 0900 -- -- 88 14 98 %   07/17/24 0845 -- -- 88 13 99 %   07/17/24 0830 -- -- 88 14 98 %   07/17/24 0815 -- -- 85 12 100 %   07/17/24 0800 -- -- 87 13 100 %   07/17/24 0745 -- -- 87 19 96 %   07/17/24 0730 -- -- 87 13 100 %   07/17/24 0721 -- -- 87 -- 100 %   07/17/24 0715 -- -- 87 15 100 %   07/17/24 0700 -- -- 87 14 100 %   07/17/24 0645 -- -- 86 13 100 %   07/17/24 0630 139/76 37 C (98.6 F) 85 14 100 %   07/17/24 0615 -- -- 89 20 100 %   07/17/24 0611 -- 37 C (98.6 F) 89 16 100 %   07/17/24 0600 138/79 37 C (98.6 F) 89 17 100 %   07/17/24 0545 138/78 37 C (98.6 F) 89 16 100 %   07/17/24 0544 -- 36.9 C (98.4 F) 89  14 100 %   07/17/24 0530 138/78 37 C (98.6 F) 91 16 100 %   07/17/24 0515 -- -- 91 13 100 %   07/17/24 0504 -- -- 91 -- 100 %   07/17/24 0500 138/78 37 C (98.6 F) 92 13 100 %   07/17/24 0445 -- -- 92 17 100 %   07/17/24 0430 -- -- 91 14 100 %   07/17/24 0415 138/78 37 C (98.6 F) 92 13 100 %   07/17/24 0400 -- -- 93 15 100 %   07/17/24 0345 138/78 -- 92 18 100 %   07/17/24 0330 132/73 36.7 C (98.1 F) 92 (!) 10 100 %   07/17/24 0318 -- 36.7 C (98 F) -- -- --   07/17/24 0315 109/80 36.7 C (98.1 F) 92 17 100 %   07/17/24 0300 102/77 -- -- -- --   07/17/24 0245 104/60 -- 90 18 --   07/17/24 0230 102/65 -- 89 19 --   07/17/24 0215 106/62 -- (!) 114 (!) 24 --   07/17/24 0206 (!) 84/54 -- -- -- --   07/17/24 0203 108/70 -- (!) 116 18 --   07/17/24 0200 122/69 36.7 C (98.1 F) (!) 125 (!) 23 --   07/17/24 0157 122/82 -- (!) 127 13 --   07/17/24 0154 126/87 -- (!) 138 20 --   07/17/24 0151 (!) 139/97 -- (!) 103 13 --   07/17/24 0148 (!) 145/61 -- (!) 107 (!) 0 --   07/17/24 0145 (!) 146/60 -- (!) 112 16 --   07/17/24 0142 (!) 148/78 -- (!) 103 (!) 21 --  07/17/24 0139 114/68 -- (!) 106 (!) 23 --   07/17/24 0136 (!) 101/41 -- (!) 105 (!) 21 (!) 74 %   07/17/24 0133 98/63 -- 87 (!) 26 (!) 72 %   07/16/24 2300 (!) 124/103 36.8 C (98.2 F) 71 (!) 22 98 %   07/16/24 2200 (!) 85/54 -- 80 19 98 %   07/16/24 2140 -- -- -- (!) 23 --   07/16/24 2112 102/88 -- 84 -- --   07/16/24 2100 117/69 -- 82 (!) 25 100 %   07/16/24 2000 (!) 81/54 -- 89 (!) 25 98 %   07/16/24 1900 122/69 36.4 C (97.6 F) 85 (!) 27 94 %   07/16/24 1800 103/60 -- 83 (!) 21 94 %   07/16/24 1730 (!) 110/56 -- 85 (!) 21 98 %   07/16/24 1715 -- -- 87 18 92 %   07/16/24 1700 118/79 -- 85 (!) 28 92 %   07/16/24 1645 119/73 -- 88 (!) 24 91 %   07/16/24 1630 130/73 -- -- -- --   07/16/24 1445 114/72 36.7 C (98 F) 90 20 --   07/16/24 1415 123/67 -- 83 (!) 24 92 %   07/16/24 1400 112/63 -- 81 (!) 21 92 %   07/16/24 1345 117/65 -- 78 (!) 24 92 %   07/16/24  1330 115/60 -- 81 16 92 %        Physical Exam:  Physical Exam  Constitutional:       General: She is not in acute distress.     Comments: Mechanically ventilated and sedated   Eyes:      General: Scleral icterus present.   Cardiovascular:      Rate and Rhythm: Normal rate.   Pulmonary:      Comments: Mechanically ventilated  Abdominal:      Comments: Abdomen is soft, distended, with very hypoactive bowel sounds.    Skin:     General: Skin is warm and dry.      Comments: No further bleeding noted from right lower quadrant trocar site, sutures clean dry and intact   Neurological:      Comments: Sedated          Studies:  I have reviewed all available studies within the electronic medical record.    Results for orders placed or performed during the hospital encounter of 07/08/24 (from the past 24 hours)   PLATELET COUNT   Result Value Ref Range    PLATELETS 46 (L) 150 - 400 x10^3/uL    MPV     OCCULT BLOOD, STOOL   Result Value Ref Range    OCCULT BLOOD Positive (A) Negative   BLOOD GAS W/ CO-OX, LYTES, LACTATE REFLEX Arterial   Result Value Ref Range    %FIO2 (ARTERIAL) 100 %    PH (ARTERIAL) 7.21 (L) 7.35 - 7.45    PCO2 (ARTERIAL) 20 (L) 35 - 45 mm/Hg    PO2 (ARTERIAL) 129 (H) 83 - 108 mm/Hg    BICARBONATE (ARTERIAL) 10.8 (L) 21.0 - 28.0 mmol/L    BASE DEFICIT 18.2 (H) 0.0 - 3.0 mmol/L    PAO2/FIO2 RATIO 129     O2 SATURATION (ARTERIAL) 98.2 94.0 - 98.0 %    HEMOGLOBIN 6.8 (LL) 12.0 - 18.0 g/dL    HEMATOCRITRT 20 (L) 37 - 50 %    OXYHEMOGLOBIN 97.4 (H) 90.0 - 95.0 %    CARBOXYHEMOGLOBIN 1.8 <=3.0 %    MET-HEMOGLOBIN 0.8 <=1.5 %  O2CT 9.6 %    SODIUM 147 (H) 136 - 145 mmol/L    WHOLE BLOOD POTASSIUM 5.2 (H) 3.5 - 5.1 mmol/L    CHLORIDE 104 98 - 107 mmol/L    IONIZED CALCIUM 1.09 (L) 1.15 - 1.33 mmol/L    GLUCOSE 33 (LL) 65 - 125 mg/dL    LACTATE >82.9 (HH) <=8.0 mmol/L   LACTIC ACID LEVEL W/ REFLEX FOR LEVEL >2.0   Result Value Ref Range    LACTIC ACID 19.2 (HH) 0.5 - 2.0 mmol/L   URINALYSIS, MACRO/MICRO   Result  Value Ref Range    COLOR Dark Yellow Yellow, Dark Yellow, Light Yellow    APPEARANCE Cloudy (A) Clear, Slightly Cloudy    PH 5.0 5.0 - 7.0    SPECIFIC GRAVITY 1.018 1.005 - 1.025    PROTEIN 2+ (A) Negative mg/dL    NITRITE Negative Negative    GLUCOSE Negative Negative mg/dL    KETONES Trace (A) Negative mg/dL    BLOOD 2+ (A) Negative, Non-Hemolyzed Trace mg/dL    BILIRUBIN 2+ (A) Negative mg/dL    UROBILINOGEN 1.0 0.2 , 1.0 mg/dL    LEUKOCYTES Negative Negative WBCs/uL    RBCS 12.5 (H) <=3.0 /hpf    WBCS 2.3 <=4.0 /hpf    HYALINE CASTS Present (A) (none) /lpf    SQUAMOUS EPITHELIAL CELLS 3.5 0.0 - 10.0 /hpf    TOTAL CASTS 20.6 (H) <=2.0 /lpf    BACTERIA 153.2 (H) <=0 /hpf   C-REACTIVE PROTEIN (CRP)   Result Value Ref Range    C-REACTIVE PROTEIN (CRP) 8.5 (H) 0.0 - 0.9 mg/dL   CBC   Result Value Ref Range    WBC 12.3 (H) 3.7 - 11.0 x10^3/uL    RBC 2.15 (L) 3.85 - 5.22 x10^6/uL    HGB 6.0 (LL) 11.5 - 16.0 g/dL    HCT 79.5 (L) 65.1 - 46.0 %    MCV 94.9 78.0 - 100.0 fL    MCH 27.9 26.0 - 32.0 pg    MCHC 29.4 (L) 31.0 - 35.5 g/dL    RDW-CV 83.4 (H) 88.4 - 15.5 %    PLATELETS 46 (L) 150 - 400 x10^3/uL    MPV 11.0 8.7 - 12.5 fL   COMPREHENSIVE METABOLIC PANEL, NON-FASTING   Result Value Ref Range    SODIUM 145 (H) 133 - 144 mmol/L    POTASSIUM 4.7 3.2 - 5.0 mmol/L    CHLORIDE 103 96 - 106 mmol/L    CO2 TOTAL 9 (LL) 22 - 30 mmol/L    ANION GAP 33 (H) 7 - 18 mmol/L    BUN 63 (H) 8 - 23 mg/dL    CREATININE 8.31 (H) 0.50 - 0.90 mg/dL    ESTIMATED GFR 33 (L) >90 mL/min/1.66m^2    ALBUMIN 2.7 (L) 3.5 - 5.2 g/dL    CALCIUM 8.6 8.3 - 89.2 mg/dL    GLUCOSE 41 (LL) 74 - 109 mg/dL    ALKALINE PHOSPHATASE 437 (H) 35 - 129 U/L    ALT (SGPT) 542 (H) 0 - 33 U/L    AST (SGOT) 1,060 (H) 0 - 32 U/L    BILIRUBIN TOTAL 5.9 (H) 0.2 - 1.2 mg/dL    PROTEIN TOTAL 4.8 (L) 6.4 - 8.3 g/dL   IONIZED CALCIUM WITH PH   Result Value Ref Range    PH (VENOUS) 7.08 (LL) 7.32 - 7.43    IONIZED CALCIUM 1.09 (L) 1.15 - 1.33 mmol/L   MAGNESIUM    Result Value  Ref Range  MAGNESIUM  2.5 (H) 1.6 - 2.4 mg/dL   NT-PROBNP   Result Value Ref Range    NT-PROBNP 5,866 (H) 0 - 125 pg/mL   PHOSPHORUS   Result Value Ref Range    PHOSPHORUS 6.6 (H) 2.5 - 4.5 mg/dL   PROCALCITONIN   Result Value Ref Range    PROCALCITONIN  79.80 (H) 0.00 - 0.50 ng/mL   PT/INR   Result Value Ref Range    PROTHROMBIN TIME 27.1 (H) 12.1 - 15.3 seconds    INR 2.52 (H) 0.86 - 1.14   PTT (PARTIAL THROMBOPLASTIN TIME)   Result Value Ref Range    APTT 37.9 (H) 22.4 - 34.8 seconds   URIC ACID   Result Value Ref Range    URIC ACID 15.5 (HH) 2.4 - 5.7 mg/dL   STREP PNEUMONIAE AND LEGIONELLA ANTIGEN, URINE    Specimen: Urine, Site not specified   Result Value Ref Range    S.PNEUMONIAE ANTIGEN Negative Negative, Indeterminate    LEGIONELLA ANTIGEN Negative Negative, Indeterminate   AMMONIA   Result Value Ref Range    AMMONIA 113 (HH) 11 - 51 umol/L   RESPIRATORY CULTURE AND GRAM STAIN (PERFORMABLE)    Specimen: Sputum   Result Value Ref Range    GRAM STAIN 4+ Many WBCs     GRAM STAIN 1+ Rare Yeast    CORTISOL, PLASMA OR SERUM   Result Value Ref Range    CORTISOL 76.4 ug/dL   FERRITIN   Result Value Ref Range    FERRITIN >2,000 (H) 13 - 150 ng/mL   TRIGLYCERIDE   Result Value Ref Range    TRIGLYCERIDES 103 0 - 150 mg/dL   LDH   Result Value Ref Range    LDH >2,500 (H) 135 - 214 U/L   POC BLOOD GLUCOSE (RESULTS)   Result Value Ref Range    GLUCOSE, POC 153 (H) 70 - 100 mg/dl   PRODUCT: FFP/PLASMA - UNITS : 2 Units   Result Value Ref Range    Coding System ISBT128     UNIT NUMBER [id number]     BLOOD COMPONENT TYPE THAWED PLASMA     UNIT DIVISION 00     UNIT DISPENSE STATUS ISSUED     TRANSFUSION STATUS OK TO TRANSFUSE     Product Code Z7315C99     Coding System ISBT128     UNIT NUMBER [id number]     BLOOD COMPONENT TYPE THAWED PLASMA     UNIT DIVISION 00     UNIT DISPENSE STATUS ISSUED     TRANSFUSION STATUS OK TO TRANSFUSE     Product Code [id number]    LACTIC ACID - FIRST REFLEX   Result Value Ref Range     LACTIC ACID 15.2 (HH) 0.5 - 2.0 mmol/L   CBC   Result Value Ref Range    WBC 11.3 (H) 3.7 - 11.0 x10^3/uL    RBC 2.28 (L) 3.85 - 5.22 x10^6/uL    HGB 6.6 (LL) 11.5 - 16.0 g/dL    HCT 78.4 (L) 65.1 - 46.0 %    MCV 94.3 78.0 - 100.0 fL    MCH 28.9 26.0 - 32.0 pg    MCHC 30.7 (L) 31.0 - 35.5 g/dL    RDW-CV 83.0 (H) 88.4 - 15.5 %    PLATELETS 45 (L) 150 - 400 x10^3/uL    MPV 11.1 8.7 - 12.5 fL   COMPREHENSIVE METABOLIC PANEL, NON-FASTING   Result Value Ref Range    SODIUM 149 (H)  133 - 144 mmol/L    POTASSIUM 3.8 3.2 - 5.0 mmol/L    CHLORIDE 103 96 - 106 mmol/L    CO2 TOTAL 14 (L) 22 - 30 mmol/L    ANION GAP 32 (H) 7 - 18 mmol/L    BUN 63 (H) 8 - 23 mg/dL    CREATININE 8.43 (H) 0.50 - 0.90 mg/dL    ESTIMATED GFR 36 (L) >90 mL/min/1.42m^2    ALBUMIN 2.6 (L) 3.5 - 5.2 g/dL    CALCIUM 9.1 8.3 - 89.2 mg/dL    GLUCOSE 807 (H) 74 - 109 mg/dL    ALKALINE PHOSPHATASE 370 (H) 35 - 129 U/L    ALT (SGPT) 599 (H) 0 - 33 U/L    AST (SGOT) 1,184 (H) 0 - 32 U/L    BILIRUBIN TOTAL 5.2 (H) 0.2 - 1.2 mg/dL    PROTEIN TOTAL 4.5 (L) 6.4 - 8.3 g/dL   IONIZED CALCIUM WITH PH   Result Value Ref Range    PH (VENOUS) 7.29 (L) 7.32 - 7.43    IONIZED CALCIUM 1.12 (L) 1.15 - 1.33 mmol/L   MAGNESIUM    Result Value Ref Range    MAGNESIUM  2.1 1.6 - 2.4 mg/dL   PHOSPHORUS   Result Value Ref Range    PHOSPHORUS 6.7 (H) 2.5 - 4.5 mg/dL   PT/INR   Result Value Ref Range    PROTHROMBIN TIME 24.4 (H) 12.1 - 15.3 seconds    INR 2.19 (H) 0.86 - 1.14   PTT (PARTIAL THROMBOPLASTIN TIME)   Result Value Ref Range    APTT 35.7 (H) 22.4 - 34.8 seconds   BLOOD GAS W/ CO-OX, LYTES, LACTATE REFLEX Arterial   Result Value Ref Range    %FIO2 (ARTERIAL) 100 %    PH (ARTERIAL) 7.24 (L) 7.35 - 7.45    PCO2 (ARTERIAL) 35 35 - 45 mm/Hg    PO2 (ARTERIAL) 453 (H) 83 - 108 mm/Hg    BICARBONATE (ARTERIAL) 16.1 (L) 21.0 - 28.0 mmol/L    BASE DEFICIT 11.4 (H) 0.0 - 3.0 mmol/L    PAO2/FIO2 RATIO 453     O2 SATURATION (ARTERIAL) 100.0 94.0 - 98.0 %    HEMOGLOBIN 9.3 (L) 12.0 - 18.0  g/dL    HEMATOCRITRT 28 (L) 37 - 50 %    OXYHEMOGLOBIN 98.3 (H) 90.0 - 95.0 %    CARBOXYHEMOGLOBIN 1.7 <=3.0 %    MET-HEMOGLOBIN <0.7 <=1.5 %    O2CT 14.1 %    SODIUM 144 136 - 145 mmol/L    WHOLE BLOOD POTASSIUM 3.7 3.5 - 5.1 mmol/L    CHLORIDE 104 98 - 107 mmol/L    IONIZED CALCIUM 1.14 (L) 1.15 - 1.33 mmol/L    GLUCOSE 172 (H) 65 - 125 mg/dL    LACTATE 85.1 (HH) <=8.0 mmol/L   POC BLOOD GLUCOSE (RESULTS)   Result Value Ref Range    GLUCOSE, POC 220 (H) 70 - 100 mg/dl   PRODUCT: FFP/PLASMA - UNITS : 2 Units   Result Value Ref Range    Coding System ISBT128     UNIT NUMBER [id number]     BLOOD COMPONENT TYPE THAWED PLASMA     UNIT DIVISION 00     UNIT DISPENSE STATUS ISSUED     TRANSFUSION STATUS OK TO TRANSFUSE     Product Code Z7315C99     Coding System ISBT128     UNIT NUMBER [id number]     BLOOD COMPONENT TYPE THAWED PLASMA     UNIT  DIVISION 00     UNIT DISPENSE STATUS ISSUED     TRANSFUSION STATUS OK TO TRANSFUSE     Product Code [phone]    PRODUCT: PLATELETS - UNITS , 1 Units   Result Value Ref Range    Coding System ISBT128     UNIT NUMBER [id number]     BLOOD COMPONENT TYPE PATHOGEN REDUCED PLASMA REDUCED LOW VOLUME2     UNIT DIVISION 00     UNIT DISPENSE STATUS ISSUED     TRANSFUSION STATUS OK TO TRANSFUSE     Product Code Z0860C99    POC BLOOD GLUCOSE (RESULTS)   Result Value Ref Range    GLUCOSE, POC 289 (H) 70 - 100 mg/dl   LACTIC ACID - SECOND REFLEX   Result Value Ref Range    LACTIC ACID 7.1 (HH) 0.5 - 2.0 mmol/L   CBC   Result Value Ref Range    WBC 6.7 3.7 - 11.0 x10^3/uL    RBC 2.88 (L) 3.85 - 5.22 x10^6/uL    HGB 8.3 (L) 11.5 - 16.0 g/dL    HCT 74.3 (L) 65.1 - 46.0 %    MCV 88.9 78.0 - 100.0 fL    MCH 28.8 26.0 - 32.0 pg    MCHC 32.4 31.0 - 35.5 g/dL    RDW-CV 85.1 88.4 - 84.4 %    PLATELETS 58 (L) 150 - 400 x10^3/uL    MPV 10.9 8.7 - 12.5 fL   COMPREHENSIVE METABOLIC PANEL, NON-FASTING   Result Value Ref Range    SODIUM 149 (H) 133 - 144 mmol/L    POTASSIUM 3.4 3.2 - 5.0 mmol/L     CHLORIDE 101 96 - 106 mmol/L    CO2 TOTAL 24 22 - 30 mmol/L    ANION GAP 24 (H) 7 - 18 mmol/L    BUN 59 (H) 8 - 23 mg/dL    CREATININE 8.49 (H) 0.50 - 0.90 mg/dL    ESTIMATED GFR 38 (L) >90 mL/min/1.52m^2    ALBUMIN 3.1 (L) 3.5 - 5.2 g/dL    CALCIUM 9.3 8.3 - 89.2 mg/dL    GLUCOSE 680 (H) 74 - 109 mg/dL    ALKALINE PHOSPHATASE 302 (H) 35 - 129 U/L    ALT (SGPT) 743 (H) 0 - 33 U/L    AST (SGOT) 1,532 (H) 0 - 32 U/L    BILIRUBIN TOTAL 5.0 (H) 0.2 - 1.2 mg/dL    PROTEIN TOTAL 5.3 (L) 6.4 - 8.3 g/dL   IONIZED CALCIUM WITH PH   Result Value Ref Range    PH (VENOUS) 7.35 7.32 - 7.43    IONIZED CALCIUM 1.12 (L) 1.15 - 1.33 mmol/L   MAGNESIUM    Result Value Ref Range    MAGNESIUM  1.9 1.6 - 2.4 mg/dL   PHOSPHORUS   Result Value Ref Range    PHOSPHORUS 6.4 (H) 2.5 - 4.5 mg/dL   PT/INR   Result Value Ref Range    PROTHROMBIN TIME 21.4 (H) 12.1 - 15.3 seconds    INR 1.84 (H) 0.86 - 1.14   PTT (PARTIAL THROMBOPLASTIN TIME)   Result Value Ref Range    APTT 38.0 (H) 22.4 - 34.8 seconds   FIBRINOGEN   Result Value Ref Range    FIBRINOGEN 333 251 - 576 mg/dL   PRODUCT: FFP/PLASMA - UNITS : 2 Units   Result Value Ref Range    Coding System ISBT128     UNIT NUMBER [id number]     BLOOD COMPONENT TYPE THAWED PLASMA  UNIT DIVISION 00     UNIT DISPENSE STATUS ALLOCATED     TRANSFUSION STATUS OK TO TRANSFUSE     Product Code [phone]     Coding System ISBT128     UNIT NUMBER [id number]     BLOOD COMPONENT TYPE THAWED PLASMA     UNIT DIVISION 00     UNIT DISPENSE STATUS ALLOCATED     TRANSFUSION STATUS OK TO TRANSFUSE     Product Code [id number]    PRODUCT: PLATELETS - UNITS , 1 Units   Result Value Ref Range    Coding System ISBT128     UNIT NUMBER [id number]     BLOOD COMPONENT TYPE PATHOGEN REDUCED PLASMA REDUCED PHER     UNIT DIVISION 00     UNIT DISPENSE STATUS ISSUED     TRANSFUSION STATUS OK TO TRANSFUSE     Product Code [id number]    TRANSTHORACIC ECHOCARDIOGRAM - ADULT   Result Value Ref Range    EF VISUAL ESTIMATE 55      EF 60    POC BLOOD GLUCOSE (RESULTS)   Result Value Ref Range    GLUCOSE, POC 294 (H) 70 - 100 mg/dl   CBC   Result Value Ref Range    WBC 6.9 3.7 - 11.0 x10^3/uL    RBC 2.45 (L) 3.85 - 5.22 x10^6/uL    HGB 7.1 (L) 11.5 - 16.0 g/dL    HCT 78.4 (L) 65.1 - 46.0 %    MCV 87.8 78.0 - 100.0 fL    MCH 29.0 26.0 - 32.0 pg    MCHC 33.0 31.0 - 35.5 g/dL    RDW-CV 84.9 88.4 - 84.4 %    PLATELETS 42 (L) 150 - 400 x10^3/uL    MPV 10.8 8.7 - 12.5 fL   COMPREHENSIVE METABOLIC PANEL, NON-FASTING   Result Value Ref Range    SODIUM 149 (H) 133 - 144 mmol/L    POTASSIUM 3.3 3.2 - 5.0 mmol/L    CHLORIDE 103 96 - 106 mmol/L    CO2 TOTAL 27 22 - 30 mmol/L    ANION GAP 19 (H) 7 - 18 mmol/L    BUN 59 (H) 8 - 23 mg/dL    CREATININE 8.44 (H) 0.50 - 0.90 mg/dL    ESTIMATED GFR 36 (L) >90 mL/min/1.27m^2    ALBUMIN 3.1 (L) 3.5 - 5.2 g/dL    CALCIUM 9.4 8.3 - 89.2 mg/dL    GLUCOSE 793 (H) 74 - 109 mg/dL    ALKALINE PHOSPHATASE 310 (H) 35 - 129 U/L    ALT (SGPT) 1,025 (H) 0 - 33 U/L    AST (SGOT) 2,418 (H) 0 - 32 U/L    BILIRUBIN TOTAL 5.6 (H) 0.2 - 1.2 mg/dL    PROTEIN TOTAL 5.2 (L) 6.4 - 8.3 g/dL   IONIZED CALCIUM WITH PH   Result Value Ref Range    PH (VENOUS) 7.46 (H) 7.32 - 7.43    IONIZED CALCIUM 1.19 1.15 - 1.33 mmol/L   MAGNESIUM    Result Value Ref Range    MAGNESIUM  1.9 1.6 - 2.4 mg/dL   PHOSPHORUS   Result Value Ref Range    PHOSPHORUS 4.5 2.5 - 4.5 mg/dL   PT/INR   Result Value Ref Range    PROTHROMBIN TIME 20.8 (H) 12.1 - 15.3 seconds    INR 1.77 (H) 0.86 - 1.14   PTT (PARTIAL THROMBOPLASTIN TIME)   Result Value Ref Range    APTT 38.7 (H) 22.4 - 34.8 seconds  FIBRINOGEN   Result Value Ref Range    FIBRINOGEN 305 251 - 576 mg/dL   TEG, RAPID GLOBAL WITH LYSIS (TRAUMA)   Result Value Ref Range    R (CK) >17.0 (H) 4.6 - 9.1 min    LYS30 (%) 0.0 0.0 - 2.6 %    MA (CRT RAPID) 47.3 (L) 52.0 - 70.0 mm    MA FIBRINOGEN (CFF) 19.1 15.0 - 32.0 mm   BLOOD GAS W/ CO-OX, LYTES, LACTATE REFLEX Arterial   Result Value Ref Range    %FIO2 (ARTERIAL)  50 %    PH (ARTERIAL) 7.41 7.35 - 7.45    PCO2 (ARTERIAL) 44 35 - 45 mm/Hg    PO2 (ARTERIAL) 107 83 - 108 mm/Hg    BICARBONATE (ARTERIAL) 27.2 21.0 - 28.0 mmol/L    BASE EXCESS (ARTERIAL) 2.9 0.0 - 3.0 mmol/L    PAO2/FIO2 RATIO 214     O2 SATURATION (ARTERIAL) 98.2 94.0 - 98.0 %    HEMOGLOBIN 9.0 (L) 12.0 - 18.0 g/dL    HEMATOCRITRT 27 (L) 37 - 50 %    OXYHEMOGLOBIN 97.9 (H) 90.0 - 95.0 %    CARBOXYHEMOGLOBIN 1.4 <=3.0 %    MET-HEMOGLOBIN <0.7 <=1.5 %    O2CT 12.6 %    SODIUM 143 136 - 145 mmol/L    WHOLE BLOOD POTASSIUM 3.3 (L) 3.5 - 5.1 mmol/L    CHLORIDE 105 98 - 107 mmol/L    IONIZED CALCIUM 1.22 1.15 - 1.33 mmol/L    GLUCOSE 198 (H) 65 - 125 mg/dL    LACTATE 5.9 (HH) <=8.0 mmol/L   LACTIC ACID LEVEL W/ REFLEX FOR LEVEL >2.0   Result Value Ref Range    LACTIC ACID 6.2 (HH) 0.5 - 2.0 mmol/L   POC BLOOD GLUCOSE (RESULTS)   Result Value Ref Range    GLUCOSE, POC 176 (H) 70 - 100 mg/dl     CT BRAIN WO IV CONTRAST  Result Date: 07/16/2024  Impression Stable exam . . . s/d/g Radiologist location ID: [id number]          Assessment & Plan:  Active Hospital Problems    Diagnosis    Primary Problem: Acute hypoxic respiratory failure (CMS HCC)    Subdural hematoma (CMS HCC)    NSVT (nonsustained ventricular tachycardia) (CMS HCC)    Heart failure, unspecified HF chronicity, unspecified heart failure type (CMS HCC)    Acute cholecystitis    Thickening of wall of gallbladder with pericholecystic fluid    Symptomatic anemia    COVID-19    Acute blood loss anemia    Transaminitis    Malignant neoplasm of lung, unspecified laterality, unspecified part of lung (CMS HCC)    Pulmonary HTN (CMS HCC)    Pneumonia due to COVID-19 virus    Mood disorder (CMS HCC)    Anemia, unspecified type    Cigarette smoker    Metastatic cancer to liver    History of breast cancer    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  Continue ICU management per critical care team.  Continue to monitor hemoglobin and transfuse as necessary.  Not a surgical candidate for any intervention at this time.  We will consult GI for HIDA scan possible bile duct leak to consider ERCP.  Verneita LITTIE Pizza, APRN, CNP    Attending attestation:  I saw and evaluated the patient with my midlevel in the ICU. I performed a substantial portion of the patients care.  I personally performed the exam and the medical decision making for the encounter today.  DOS: 07/17/24    Multiple events happened during the course of the afternoon night.  HIDA scan was suggestive of a possible leak.  We will get the opinion of GI at this point in time but we believe given the subsequent events of the morning, any intervention we will be risky.    During the earlier events of the morning, patient became acutely ill and was subsequently intubated and sedated.  She required blood transfusions which include platelets and FFP.  CT scan demonstrated possible increase in size of hemoperitoneum.  This is as a result of hepatic insufficiency.  Lactic acid became elevated and INR became elevated.  [name] also thrombocytopenic.      I believe patient is having an acute event of hepatic insufficiency and as such not producing any clotting factors.  This is obviously making her overall condition worse as she is now starting to have some element of internal bleeding from her gallbladder fossa.    Given her coagulopathic nature, patient is not a good candidate as any operative intervention we will be risky in her case.    We recommend correcting the coagulopathy and we will continue to reassess closely.    Family at bedside and they have been updated on her condition.      This chart was partially dictated by [employer], a voice recognition software.  It may contain errors or words that were not intended to be used.  Please contact the provider for  errors or clarifications.    /Renae Mottley JINNY Raider MD  07/17/24 13:43   Department of Surgery                         [1] No Known Allergies  [2]   Social History  Tobacco Use    Smoking status: Every Day     Current packs/day: 0.50     Average packs/day: 0.5 packs/day for 52.8 years (26.4 ttl pk-yrs)     Types: Cigarettes     Start date: 1973    Smokeless tobacco: Never   Vaping Use    Vaping status: Never Used   Substance Use Topics    Alcohol use: Yes     Comment: occasional    Drug use: Yes     Types: Marijuana

## 2024-07-17 NOTE — Nurses Notes (Signed)
 Restraint Initiation    Patient assessed and found to have the following condition acute critical illness requiring mechanical ventilation          Least restrictive alternatives attempted: re-oreitnation, lighting, close to nurses station    [name] exhibiting the following behaviors: Attempts to remove ETT.    The reason for application of restraint: patient attempting to remove artificial airway, patient attempting to pull at IV lines, patient attempting to pull at invasive medical tubes, and intermittent break through RASS> -2    The restraint was applied to facilitate medical/surgical treatment to ensure safety.    The patient will continue to be evaluated and assessments documented on the flowsheet to ensure that the patient is released from the restraint at the earliest possible time.

## 2024-07-18 ENCOUNTER — Inpatient Hospital Stay (HOSPITAL_COMMUNITY)

## 2024-07-18 ENCOUNTER — Inpatient Hospital Stay: Payer: Medicare HMO | Attending: Hematology and Oncology | Admitting: Hematology and Oncology

## 2024-07-18 VITALS — BP 120/84 | HR 68 | Temp 97.6°F | Resp 18 | Ht 65.0 in | Wt 182.8 lb

## 2024-07-18 DIAGNOSIS — Z1722 Progesterone receptor negative status: Secondary | ICD-10-CM | POA: Insufficient documentation

## 2024-07-18 DIAGNOSIS — C50211 Malignant neoplasm of upper-inner quadrant of right female breast: Secondary | ICD-10-CM | POA: Diagnosis not present

## 2024-07-18 DIAGNOSIS — Z79811 Long term (current) use of aromatase inhibitors: Secondary | ICD-10-CM | POA: Insufficient documentation

## 2024-07-18 DIAGNOSIS — Z923 Personal history of irradiation: Secondary | ICD-10-CM | POA: Diagnosis not present

## 2024-07-18 DIAGNOSIS — Z9221 Personal history of antineoplastic chemotherapy: Secondary | ICD-10-CM | POA: Insufficient documentation

## 2024-07-18 DIAGNOSIS — Z1721 Progesterone receptor positive status: Secondary | ICD-10-CM | POA: Insufficient documentation

## 2024-07-18 DIAGNOSIS — N951 Menopausal and female climacteric states: Secondary | ICD-10-CM | POA: Insufficient documentation

## 2024-07-18 DIAGNOSIS — Z170 Estrogen receptor positive status [ER+]: Secondary | ICD-10-CM | POA: Diagnosis not present

## 2024-07-18 DIAGNOSIS — S065XAA Traumatic subdural hemorrhage with loss of consciousness status unknown, initial encounter: Secondary | ICD-10-CM

## 2024-07-18 DIAGNOSIS — K8000 Calculus of gallbladder with acute cholecystitis without obstruction: Principal | ICD-10-CM

## 2024-07-18 DIAGNOSIS — Z48815 Encounter for surgical aftercare following surgery on the digestive system: Secondary | ICD-10-CM

## 2024-07-18 DIAGNOSIS — R9431 Abnormal electrocardiogram [ECG] [EKG]: Secondary | ICD-10-CM

## 2024-07-18 DIAGNOSIS — E440 Moderate protein-calorie malnutrition: Secondary | ICD-10-CM | POA: Insufficient documentation

## 2024-07-18 DIAGNOSIS — J9690 Respiratory failure, unspecified, unspecified whether with hypoxia or hypercapnia: Secondary | ICD-10-CM

## 2024-07-18 DIAGNOSIS — Z9911 Dependence on respirator [ventilator] status: Secondary | ICD-10-CM

## 2024-07-18 DIAGNOSIS — W19XXXA Unspecified fall, initial encounter: Secondary | ICD-10-CM

## 2024-07-18 DIAGNOSIS — Z9049 Acquired absence of other specified parts of digestive tract: Secondary | ICD-10-CM

## 2024-07-18 DIAGNOSIS — C787 Secondary malignant neoplasm of liver and intrahepatic bile duct: Secondary | ICD-10-CM

## 2024-07-18 DIAGNOSIS — I4891 Unspecified atrial fibrillation: Secondary | ICD-10-CM

## 2024-07-18 DIAGNOSIS — I509 Heart failure, unspecified: Secondary | ICD-10-CM

## 2024-07-18 LAB — COMPREHENSIVE METABOLIC PANEL, NON-FASTING
ALBUMIN: 2.9 g/dL — ABNORMAL LOW (ref 3.5–5.2)
ALBUMIN: 3 g/dL — ABNORMAL LOW (ref 3.5–5.2)
ALBUMIN: 3.1 g/dL — ABNORMAL LOW (ref 3.5–5.2)
ALBUMIN: 3.2 g/dL — ABNORMAL LOW (ref 3.5–5.2)
ALBUMIN: 3.1 g/dL — ABNORMAL LOW (ref 3.5–5.2)
ALBUMIN: 3.2 g/dL — ABNORMAL LOW (ref 3.5–5.2)
ALBUMIN: 3.2 g/dL — ABNORMAL LOW (ref 3.5–5.2)
ALKALINE PHOSPHATASE: 326 U/L — ABNORMAL HIGH (ref 35–129)
ALKALINE PHOSPHATASE: 305 U/L — ABNORMAL HIGH (ref 35–129)
ALKALINE PHOSPHATASE: 294 U/L — ABNORMAL HIGH (ref 35–129)
ALKALINE PHOSPHATASE: 308 U/L — ABNORMAL HIGH (ref 35–129)
ALKALINE PHOSPHATASE: 267 U/L — ABNORMAL HIGH (ref 35–129)
ALKALINE PHOSPHATASE: 279 U/L — ABNORMAL HIGH (ref 35–129)
ALKALINE PHOSPHATASE: 257 U/L — ABNORMAL HIGH (ref 35–129)
ALT (SGPT): 1146 U/L — ABNORMAL HIGH (ref 0–33)
ALT (SGPT): 1130 U/L — ABNORMAL HIGH (ref 0–33)
ALT (SGPT): 1082 U/L — ABNORMAL HIGH (ref 0–33)
ALT (SGPT): 1053 U/L — ABNORMAL HIGH (ref 0–33)
ALT (SGPT): 1047 U/L — ABNORMAL HIGH (ref 0–33)
ALT (SGPT): 1116 U/L — ABNORMAL HIGH (ref 0–33)
ALT (SGPT): 983 U/L — ABNORMAL HIGH (ref 0–33)
ANION GAP: 21 mmol/L — ABNORMAL HIGH (ref 7–18)
ANION GAP: 20 mmol/L — ABNORMAL HIGH (ref 7–18)
ANION GAP: 21 mmol/L — ABNORMAL HIGH (ref 7–18)
ANION GAP: 21 mmol/L — ABNORMAL HIGH (ref 7–18)
ANION GAP: 20 mmol/L — ABNORMAL HIGH (ref 7–18)
ANION GAP: 20 mmol/L — ABNORMAL HIGH (ref 7–18)
ANION GAP: 21 mmol/L — ABNORMAL HIGH (ref 7–18)
AST (SGOT): 2963 U/L — ABNORMAL HIGH (ref 0–32)
AST (SGOT): 2911 U/L — ABNORMAL HIGH (ref 0–32)
AST (SGOT): 2707 U/L — ABNORMAL HIGH (ref 0–32)
AST (SGOT): 2598 U/L — ABNORMAL HIGH (ref 0–32)
AST (SGOT): 2562 U/L — ABNORMAL HIGH (ref 0–32)
AST (SGOT): 2743 U/L — ABNORMAL HIGH (ref 0–32)
AST (SGOT): 2355 U/L — ABNORMAL HIGH (ref 0–32)
BILIRUBIN TOTAL: 6.4 mg/dL — ABNORMAL HIGH (ref 0.2–1.2)
BILIRUBIN TOTAL: 6.8 mg/dL — ABNORMAL HIGH (ref 0.2–1.2)
BILIRUBIN TOTAL: 6.6 mg/dL — ABNORMAL HIGH (ref 0.2–1.2)
BILIRUBIN TOTAL: 6.6 mg/dL — ABNORMAL HIGH (ref 0.2–1.2)
BILIRUBIN TOTAL: 7 mg/dL — ABNORMAL HIGH (ref 0.2–1.2)
BILIRUBIN TOTAL: 7.5 mg/dL — ABNORMAL HIGH (ref 0.2–1.2)
BILIRUBIN TOTAL: 7.3 mg/dL — ABNORMAL HIGH (ref 0.2–1.2)
BUN: 61 mg/dL — ABNORMAL HIGH (ref 8–23)
BUN: 61 mg/dL — ABNORMAL HIGH (ref 8–23)
BUN: 63 mg/dL — ABNORMAL HIGH (ref 8–23)
BUN: 64 mg/dL — ABNORMAL HIGH (ref 8–23)
BUN: 62 mg/dL — ABNORMAL HIGH (ref 8–23)
BUN: 64 mg/dL — ABNORMAL HIGH (ref 8–23)
BUN: 65 mg/dL — ABNORMAL HIGH (ref 8–23)
CALCIUM: 9.2 mg/dL (ref 8.3–10.7)
CALCIUM: 9.1 mg/dL (ref 8.3–10.7)
CALCIUM: 9.1 mg/dL (ref 8.3–10.7)
CALCIUM: 9.2 mg/dL (ref 8.3–10.7)
CALCIUM: 8.8 mg/dL (ref 8.3–10.7)
CALCIUM: 9 mg/dL (ref 8.3–10.7)
CALCIUM: 8.9 mg/dL (ref 8.3–10.7)
CHLORIDE: 103 mmol/L (ref 96–106)
CHLORIDE: 103 mmol/L (ref 96–106)
CHLORIDE: 101 mmol/L (ref 96–106)
CHLORIDE: 102 mmol/L (ref 96–106)
CHLORIDE: 103 mmol/L (ref 96–106)
CHLORIDE: 102 mmol/L (ref 96–106)
CHLORIDE: 102 mmol/L (ref 96–106)
CO2 TOTAL: 26 mmol/L (ref 22–30)
CO2 TOTAL: 25 mmol/L (ref 22–30)
CO2 TOTAL: 27 mmol/L (ref 22–30)
CO2 TOTAL: 27 mmol/L (ref 22–30)
CO2 TOTAL: 25 mmol/L (ref 22–30)
CO2 TOTAL: 26 mmol/L (ref 22–30)
CO2 TOTAL: 27 mmol/L (ref 22–30)
CREATININE: 1.61 mg/dL — ABNORMAL HIGH (ref 0.50–0.90)
CREATININE: 1.58 mg/dL — ABNORMAL HIGH (ref 0.50–0.90)
CREATININE: 1.6 mg/dL — ABNORMAL HIGH (ref 0.50–0.90)
CREATININE: 1.6 mg/dL — ABNORMAL HIGH (ref 0.50–0.90)
CREATININE: 1.66 mg/dL — ABNORMAL HIGH (ref 0.50–0.90)
CREATININE: 1.69 mg/dL — ABNORMAL HIGH (ref 0.50–0.90)
CREATININE: 1.84 mg/dL — ABNORMAL HIGH (ref 0.50–0.90)
ESTIMATED GFR: 35 mL/min/1.73mˆ2 — ABNORMAL LOW (ref >90)
ESTIMATED GFR: 36 mL/min/1.73mˆ2 — ABNORMAL LOW (ref >90)
ESTIMATED GFR: 35 mL/min/1.73mˆ2 — ABNORMAL LOW (ref >90)
ESTIMATED GFR: 35 mL/min/1.73mˆ2 — ABNORMAL LOW (ref >90)
ESTIMATED GFR: 34 mL/min/1.73mˆ2 — ABNORMAL LOW (ref >90)
ESTIMATED GFR: 33 mL/min/1.73mˆ2 — ABNORMAL LOW (ref >90)
ESTIMATED GFR: 30 mL/min/1.73mˆ2 — ABNORMAL LOW (ref >90)
GLUCOSE: 142 mg/dL — ABNORMAL HIGH (ref 74–109)
GLUCOSE: 140 mg/dL — ABNORMAL HIGH (ref 74–109)
GLUCOSE: 175 mg/dL — ABNORMAL HIGH (ref 74–109)
GLUCOSE: 169 mg/dL — ABNORMAL HIGH (ref 74–109)
GLUCOSE: 183 mg/dL — ABNORMAL HIGH (ref 74–109)
GLUCOSE: 184 mg/dL — ABNORMAL HIGH (ref 74–109)
GLUCOSE: 177 mg/dL — ABNORMAL HIGH (ref 74–109)
POTASSIUM: 3.6 mmol/L (ref 3.2–5.0)
POTASSIUM: 4.1 mmol/L (ref 3.2–5.0)
POTASSIUM: 3.6 mmol/L (ref 3.2–5.0)
POTASSIUM: 3.5 mmol/L (ref 3.2–5.0)
POTASSIUM: 3.6 mmol/L (ref 3.2–5.0)
POTASSIUM: 3.6 mmol/L (ref 3.2–5.0)
POTASSIUM: 3.6 mmol/L (ref 3.2–5.0)
PROTEIN TOTAL: 5 g/dL — ABNORMAL LOW (ref 6.4–8.3)
PROTEIN TOTAL: 5.1 g/dL — ABNORMAL LOW (ref 6.4–8.3)
PROTEIN TOTAL: 5.3 g/dL — ABNORMAL LOW (ref 6.4–8.3)
PROTEIN TOTAL: 5.3 g/dL — ABNORMAL LOW (ref 6.4–8.3)
PROTEIN TOTAL: 5.2 g/dL — ABNORMAL LOW (ref 6.4–8.3)
PROTEIN TOTAL: 5.4 g/dL — ABNORMAL LOW (ref 6.4–8.3)
PROTEIN TOTAL: 5.5 g/dL — ABNORMAL LOW (ref 6.4–8.3)
SODIUM: 150 mmol/L — ABNORMAL HIGH (ref 133–144)
SODIUM: 148 mmol/L — ABNORMAL HIGH (ref 133–144)
SODIUM: 149 mmol/L — ABNORMAL HIGH (ref 133–144)
SODIUM: 150 mmol/L — ABNORMAL HIGH (ref 133–144)
SODIUM: 148 mmol/L — ABNORMAL HIGH (ref 133–144)
SODIUM: 148 mmol/L — ABNORMAL HIGH (ref 133–144)
SODIUM: 150 mmol/L — ABNORMAL HIGH (ref 133–144)

## 2024-07-18 LAB — BLOOD GAS W/ CO-OX, LYTES, LACTATE REFLEX
%FIO2 (ARTERIAL): 40 %
%FIO2 (ARTERIAL): 40 %
%FIO2 (ARTERIAL): 40 %
%FIO2 (ARTERIAL): 45 %
%FIO2 (ARTERIAL): 50 %
%FIO2 (ARTERIAL): 50 %
BASE EXCESS (ARTERIAL): 0 mmol/L (ref 0.0–3.0)
BASE EXCESS (ARTERIAL): 2.2 mmol/L (ref 0.0–3.0)
BASE EXCESS (ARTERIAL): 2.7 mmol/L (ref 0.0–3.0)
BASE EXCESS (ARTERIAL): 3.2 mmol/L — ABNORMAL HIGH (ref 0.0–3.0)
BASE EXCESS (ARTERIAL): 3.9 mmol/L — ABNORMAL HIGH (ref 0.0–3.0)
BASE EXCESS (ARTERIAL): 4.2 mmol/L — ABNORMAL HIGH (ref 0.0–3.0)
BICARBONATE (ARTERIAL): 24.9 mmol/L (ref 21.0–28.0)
BICARBONATE (ARTERIAL): 26.6 mmol/L (ref 21.0–28.0)
BICARBONATE (ARTERIAL): 27.1 mmol/L (ref 21.0–28.0)
BICARBONATE (ARTERIAL): 27.4 mmol/L (ref 21.0–28.0)
BICARBONATE (ARTERIAL): 28 mmol/L (ref 21.0–28.0)
BICARBONATE (ARTERIAL): 28.2 mmol/L — ABNORMAL HIGH (ref 21.0–28.0)
CARBOXYHEMOGLOBIN: 1.4 % (ref <=3.0)
CARBOXYHEMOGLOBIN: 1.4 % (ref <=3.0)
CARBOXYHEMOGLOBIN: 1.4 % (ref <=3.0)
CARBOXYHEMOGLOBIN: 1.5 % (ref <=3.0)
CARBOXYHEMOGLOBIN: 1.7 % (ref <=3.0)
CARBOXYHEMOGLOBIN: 1.7 % (ref <=3.0)
CHLORIDE: 102 mmol/L (ref 98–107)
CHLORIDE: 103 mmol/L (ref 98–107)
CHLORIDE: 104 mmol/L (ref 98–107)
CHLORIDE: 104 mmol/L (ref 98–107)
CHLORIDE: 105 mmol/L (ref 98–107)
CHLORIDE: 106 mmol/L (ref 98–107)
GLUCOSE: 118 mg/dL (ref 65–125)
GLUCOSE: 135 mg/dL — ABNORMAL HIGH (ref 65–125)
GLUCOSE: 156 mg/dL — ABNORMAL HIGH (ref 65–125)
GLUCOSE: 165 mg/dL — ABNORMAL HIGH (ref 65–125)
GLUCOSE: 169 mg/dL — ABNORMAL HIGH (ref 65–125)
GLUCOSE: 176 mg/dL — ABNORMAL HIGH (ref 65–125)
HEMATOCRITRT: 24 % — ABNORMAL LOW (ref 37–50)
HEMATOCRITRT: 24 % — ABNORMAL LOW (ref 37–50)
HEMATOCRITRT: 26 % — ABNORMAL LOW (ref 37–50)
HEMATOCRITRT: 28 % — ABNORMAL LOW (ref 37–50)
HEMATOCRITRT: 30 % — ABNORMAL LOW (ref 37–50)
HEMATOCRITRT: 31 % — ABNORMAL LOW (ref 37–50)
HEMOGLOBIN: 10.1 g/dL — ABNORMAL LOW (ref 12.0–18.0)
HEMOGLOBIN: 10.2 g/dL — ABNORMAL LOW (ref 12.0–18.0)
HEMOGLOBIN: 8 g/dL — ABNORMAL LOW (ref 12.0–18.0)
HEMOGLOBIN: 8.1 g/dL — ABNORMAL LOW (ref 12.0–18.0)
HEMOGLOBIN: 8.6 g/dL — ABNORMAL LOW (ref 12.0–18.0)
HEMOGLOBIN: 9.3 g/dL — ABNORMAL LOW (ref 12.0–18.0)
IONIZED CALCIUM: 1.1 mmol/L — ABNORMAL LOW (ref 1.15–1.33)
IONIZED CALCIUM: 1.15 mmol/L (ref 1.15–1.33)
IONIZED CALCIUM: 1.17 mmol/L (ref 1.15–1.33)
IONIZED CALCIUM: 1.18 mmol/L (ref 1.15–1.33)
IONIZED CALCIUM: 1.19 mmol/L (ref 1.15–1.33)
IONIZED CALCIUM: 1.19 mmol/L (ref 1.15–1.33)
LACTATE: 3.5 mmol/L — ABNORMAL HIGH (ref <=1.9)
LACTATE: 4.7 mmol/L (ref <=1.9)
LACTATE: 4.8 mmol/L (ref <=1.9)
LACTATE: 4.9 mmol/L (ref <=1.9)
LACTATE: 5.9 mmol/L (ref <=1.9)
LACTATE: 5.9 mmol/L (ref <=1.9)
MET-HEMOGLOBIN: 0.8 % (ref <=1.5)
MET-HEMOGLOBIN: 0.8 % (ref <=1.5)
MET-HEMOGLOBIN: 1.1 % (ref <=1.5)
MET-HEMOGLOBIN: <0.7 % (ref <=1.5)
MET-HEMOGLOBIN: <0.7 % (ref <=1.5)
MET-HEMOGLOBIN: <0.7 % (ref <=1.5)
O2 SATURATION (ARTERIAL): 93.6 % (ref 94.0–98.0)
O2 SATURATION (ARTERIAL): 93.6 % (ref 94.0–98.0)
O2 SATURATION (ARTERIAL): 95.9 % (ref 94.0–98.0)
O2 SATURATION (ARTERIAL): 95.9 % (ref 94.0–98.0)
O2 SATURATION (ARTERIAL): 97.3 % (ref 94.0–98.0)
O2 SATURATION (ARTERIAL): 99 % (ref 94.0–98.0)
O2CT: 11.1 %
O2CT: 11.5 %
O2CT: 11.6 %
O2CT: 12.6 %
O2CT: 13.5 %
O2CT: 13.6 %
OXYHEMOGLOBIN: 94.1 % (ref 90.0–95.0)
OXYHEMOGLOBIN: 94.5 % (ref 90.0–95.0)
OXYHEMOGLOBIN: 95.1 % — ABNORMAL HIGH (ref 90.0–95.0)
OXYHEMOGLOBIN: 95.6 % — ABNORMAL HIGH (ref 90.0–95.0)
OXYHEMOGLOBIN: 97.3 % — ABNORMAL HIGH (ref 90.0–95.0)
OXYHEMOGLOBIN: 98 % — ABNORMAL HIGH (ref 90.0–95.0)
PAO2/FIO2 RATIO: 160
PAO2/FIO2 RATIO: 164
PAO2/FIO2 RATIO: 195
PAO2/FIO2 RATIO: 203
PAO2/FIO2 RATIO: 238
PAO2/FIO2 RATIO: 278
PCO2 (ARTERIAL): 42 mmHg (ref 35–45)
PCO2 (ARTERIAL): 44 mmHg (ref 35–45)
PCO2 (ARTERIAL): 49 mmHg — ABNORMAL HIGH (ref 35–45)
PCO2 (ARTERIAL): 57 mmHg — ABNORMAL HIGH (ref 35–45)
PCO2 (ARTERIAL): 57 mmHg — ABNORMAL HIGH (ref 35–45)
PCO2 (ARTERIAL): 64 mmHg — ABNORMAL HIGH (ref 35–45)
PH (ARTERIAL): 7.25 — ABNORMAL LOW (ref 7.35–7.45)
PH (ARTERIAL): 7.32 — ABNORMAL LOW (ref 7.35–7.45)
PH (ARTERIAL): 7.33 — ABNORMAL LOW (ref 7.35–7.45)
PH (ARTERIAL): 7.39 (ref 7.35–7.45)
PH (ARTERIAL): 7.4 (ref 7.35–7.45)
PH (ARTERIAL): 7.44 (ref 7.35–7.45)
PO2 (ARTERIAL): 139 mmHg — ABNORMAL HIGH (ref 83–108)
PO2 (ARTERIAL): 74 mmHg — ABNORMAL LOW (ref 83–108)
PO2 (ARTERIAL): 78 mmHg — ABNORMAL LOW (ref 83–108)
PO2 (ARTERIAL): 80 mmHg — ABNORMAL LOW (ref 83–108)
PO2 (ARTERIAL): 81 mmHg — ABNORMAL LOW (ref 83–108)
PO2 (ARTERIAL): 95 mmHg (ref 83–108)
SODIUM: 143 mmol/L (ref 136–145)
SODIUM: 143 mmol/L (ref 136–145)
SODIUM: 144 mmol/L (ref 136–145)
SODIUM: 144 mmol/L (ref 136–145)
SODIUM: 144 mmol/L (ref 136–145)
SODIUM: 145 mmol/L (ref 136–145)
WHOLE BLOOD POTASSIUM: 3.4 mmol/L — ABNORMAL LOW (ref 3.5–5.1)
WHOLE BLOOD POTASSIUM: 3.4 mmol/L — ABNORMAL LOW (ref 3.5–5.1)
WHOLE BLOOD POTASSIUM: 3.5 mmol/L (ref 3.5–5.1)
WHOLE BLOOD POTASSIUM: 3.5 mmol/L (ref 3.5–5.1)
WHOLE BLOOD POTASSIUM: 3.5 mmol/L (ref 3.5–5.1)
WHOLE BLOOD POTASSIUM: 4 mmol/L (ref 3.5–5.1)

## 2024-07-18 LAB — CBC
HCT: 26.2 % — ABNORMAL LOW (ref 34.8–46.0)
HCT: 24.2 % — ABNORMAL LOW (ref 34.8–46.0)
HCT: 22.8 % — ABNORMAL LOW (ref 34.8–46.0)
HCT: 20.7 % — ABNORMAL LOW (ref 34.8–46.0)
HCT: 28.3 % — ABNORMAL LOW (ref 34.8–46.0)
HCT: 23.8 % — ABNORMAL LOW (ref 34.8–46.0)
HGB: 8.7 g/dL — ABNORMAL LOW (ref 11.5–16.0)
HGB: 8 g/dL — ABNORMAL LOW (ref 11.5–16.0)
HGB: 7.5 g/dL — ABNORMAL LOW (ref 11.5–16.0)
HGB: 6.9 g/dL — CL (ref 11.5–16.0)
HGB: 9.2 g/dL — ABNORMAL LOW (ref 11.5–16.0)
HGB: 7.6 g/dL — ABNORMAL LOW (ref 11.5–16.0)
MCH: 28 pg (ref 26.0–32.0)
MCH: 28.3 pg (ref 26.0–32.0)
MCH: 28.5 pg (ref 26.0–32.0)
MCH: 28.2 pg (ref 26.0–32.0)
MCH: 28.8 pg (ref 26.0–32.0)
MCH: 28.1 pg (ref 26.0–32.0)
MCHC: 33.2 g/dL (ref 31.0–35.5)
MCHC: 33.1 g/dL (ref 31.0–35.5)
MCHC: 32.9 g/dL (ref 31.0–35.5)
MCHC: 32.5 g/dL (ref 31.0–35.5)
MCHC: 32.5 g/dL (ref 31.0–35.5)
MCHC: 31.9 g/dL (ref 31.0–35.5)
MCV: 84.2 fL (ref 78.0–100.0)
MCV: 85.5 fL (ref 78.0–100.0)
MCV: 86.7 fL (ref 78.0–100.0)
MCV: 86.5 fL (ref 78.0–100.0)
MCV: 88.7 fL (ref 78.0–100.0)
MCV: 88.1 fL (ref 78.0–100.0)
MPV: 11.3 fL (ref 8.7–12.5)
MPV: 10.1 fL (ref 8.7–12.5)
MPV: 10.3 fL (ref 8.7–12.5)
PLATELETS: 50 x10ˆ3/uL — ABNORMAL LOW (ref 150–400)
PLATELETS: 36 x10ˆ3/uL — ABNORMAL LOW (ref 150–400)
PLATELETS: 35 x10ˆ3/uL — ABNORMAL LOW (ref 150–400)
PLATELETS: 34 x10ˆ3/uL — ABNORMAL LOW (ref 150–400)
PLATELETS: 34 x10ˆ3/uL — ABNORMAL LOW (ref 150–400)
PLATELETS: 40 x10ˆ3/uL — ABNORMAL LOW (ref 150–400)
RBC: 3.11 x10ˆ6/uL — ABNORMAL LOW (ref 3.85–5.22)
RBC: 2.83 x10ˆ6/uL — ABNORMAL LOW (ref 3.85–5.22)
RBC: 2.63 x10ˆ6/uL — ABNORMAL LOW (ref 3.85–5.22)
RBC: 2.45 x10ˆ6/uL — ABNORMAL LOW (ref 3.85–5.22)
RBC: 3.19 x10ˆ6/uL — ABNORMAL LOW (ref 3.85–5.22)
RBC: 2.7 x10ˆ6/uL — ABNORMAL LOW (ref 3.85–5.22)
RDW-CV: 15.6 % — ABNORMAL HIGH (ref 11.5–15.5)
RDW-CV: 15.7 % — ABNORMAL HIGH (ref 11.5–15.5)
RDW-CV: 15.9 % — ABNORMAL HIGH (ref 11.5–15.5)
RDW-CV: 15.9 % — ABNORMAL HIGH (ref 11.5–15.5)
RDW-CV: 16.5 % — ABNORMAL HIGH (ref 11.5–15.5)
RDW-CV: 16.8 % — ABNORMAL HIGH (ref 11.5–15.5)
WBC: 7.2 x10ˆ3/uL (ref 3.7–11.0)
WBC: 6.2 x10ˆ3/uL (ref 3.7–11.0)
WBC: 6.2 x10ˆ3/uL (ref 3.7–11.0)
WBC: 6.2 x10ˆ3/uL (ref 3.7–11.0)
WBC: 6.5 x10ˆ3/uL (ref 3.7–11.0)
WBC: 6 x10ˆ3/uL (ref 3.7–11.0)

## 2024-07-18 LAB — BLOOD GAS
%FIO2 (ARTERIAL): 40 %
BASE DEFICIT: 0.6 mmol/L (ref 0.0–3.0)
BICARBONATE (ARTERIAL): 24.5 mmol/L (ref 21.0–28.0)
O2 SATURATION (ARTERIAL): 99 % (ref 94.0–98.0)
PAO2/FIO2 RATIO: 345
PCO2 (ARTERIAL): 46 mmHg — ABNORMAL HIGH (ref 35–45)
PH (ARTERIAL): 7.35 (ref 7.35–7.45)
PO2 (ARTERIAL): 138 mmHg — ABNORMAL HIGH (ref 83–108)

## 2024-07-18 LAB — POC BLOOD GLUCOSE (RESULTS)
GLUCOSE, POC: 136 mg/dL — ABNORMAL HIGH (ref 70–100)
GLUCOSE, POC: 149 mg/dL — ABNORMAL HIGH (ref 70–100)
GLUCOSE, POC: 186 mg/dL — ABNORMAL HIGH (ref 70–100)

## 2024-07-18 LAB — PRODUCT: FFP/PLASMA - UNITS
UNIT DIVISION: 0
UNIT DIVISION: 0
UNIT DIVISION: 0
UNIT DIVISION: 0
UNIT DIVISION: 0
UNIT DIVISION: 0

## 2024-07-18 LAB — IONIZED CALCIUM WITH PH
IONIZED CALCIUM: 1.1 mmol/L — ABNORMAL LOW (ref 1.15–1.33)
IONIZED CALCIUM: 1.12 mmol/L — ABNORMAL LOW (ref 1.15–1.33)
IONIZED CALCIUM: 1.13 mmol/L — ABNORMAL LOW (ref 1.15–1.33)
IONIZED CALCIUM: 1.13 mmol/L — ABNORMAL LOW (ref 1.15–1.33)
IONIZED CALCIUM: 1.14 mmol/L — ABNORMAL LOW (ref 1.15–1.33)
IONIZED CALCIUM: 1.17 mmol/L (ref 1.15–1.33)
PH (VENOUS): 7.28 — ABNORMAL LOW (ref 7.32–7.43)
PH (VENOUS): 7.33 (ref 7.32–7.43)
PH (VENOUS): 7.36 (ref 7.32–7.43)
PH (VENOUS): 7.38 (ref 7.32–7.43)
PH (VENOUS): 7.42 (ref 7.32–7.43)
PH (VENOUS): 7.46 — ABNORMAL HIGH (ref 7.32–7.43)

## 2024-07-18 LAB — PRODUCT: PLATELETS - UNITS
UNIT DIVISION: 0
UNIT DIVISION: 0

## 2024-07-18 LAB — PRODUCT: CRYOPRECIPITATE - UNITS
UNIT DIVISION: 0
UNIT DIVISION: 0

## 2024-07-18 LAB — PTT (PARTIAL THROMBOPLASTIN TIME)
APTT: 43.4 s — ABNORMAL HIGH (ref 22.4–34.8)
APTT: 44.8 s — ABNORMAL HIGH (ref 22.4–34.8)
APTT: 45.9 s — ABNORMAL HIGH (ref 22.4–34.8)
APTT: 46.1 s — ABNORMAL HIGH (ref 22.4–34.8)
APTT: 46.5 s — ABNORMAL HIGH (ref 22.4–34.8)
APTT: 47 s — ABNORMAL HIGH (ref 22.4–34.8)

## 2024-07-18 LAB — URINE CULTURE,ROUTINE: URINE CULTURE: NO GROWTH

## 2024-07-18 LAB — MAGNESIUM
MAGNESIUM: 1.9 mg/dL (ref 1.6–2.4)
MAGNESIUM: 2.4 mg/dL (ref 1.6–2.4)
MAGNESIUM: 2.2 mg/dL (ref 1.6–2.4)
MAGNESIUM: 2.1 mg/dL (ref 1.6–2.4)
MAGNESIUM: 2 mg/dL (ref 1.6–2.4)
MAGNESIUM: 2.1 mg/dL (ref 1.6–2.4)
MAGNESIUM: 2.1 mg/dL (ref 1.6–2.4)

## 2024-07-18 LAB — PT/INR
INR: 1.79 — ABNORMAL HIGH (ref 0.86–1.14)
INR: 1.94 — ABNORMAL HIGH (ref 0.86–1.14)
INR: 1.94 — ABNORMAL HIGH (ref 0.86–1.14)
INR: 1.97 — ABNORMAL HIGH (ref 0.86–1.14)
INR: 2 — ABNORMAL HIGH (ref 0.86–1.14)
INR: 2.12 — ABNORMAL HIGH (ref 0.86–1.14)
PROTHROMBIN TIME: 21 s — ABNORMAL HIGH (ref 12.1–15.3)
PROTHROMBIN TIME: 22.3 s — ABNORMAL HIGH (ref 12.1–15.3)
PROTHROMBIN TIME: 22.3 s — ABNORMAL HIGH (ref 12.1–15.3)
PROTHROMBIN TIME: 22.6 s — ABNORMAL HIGH (ref 12.1–15.3)
PROTHROMBIN TIME: 22.8 s — ABNORMAL HIGH (ref 12.1–15.3)
PROTHROMBIN TIME: 23.8 s — ABNORMAL HIGH (ref 12.1–15.3)

## 2024-07-18 LAB — PHOSPHORUS
PHOSPHORUS: 4.1 mg/dL (ref 2.5–4.5)
PHOSPHORUS: 4.3 mg/dL (ref 2.5–4.5)
PHOSPHORUS: 4.2 mg/dL (ref 2.5–4.5)
PHOSPHORUS: 4.3 mg/dL (ref 2.5–4.5)
PHOSPHORUS: 4.6 mg/dL — ABNORMAL HIGH (ref 2.5–4.5)
PHOSPHORUS: 4.6 mg/dL — ABNORMAL HIGH (ref 2.5–4.5)
PHOSPHORUS: 5.2 mg/dL — ABNORMAL HIGH (ref 2.5–4.5)

## 2024-07-18 LAB — VANCOMYCIN, RANDOM: VANCOMYCIN RANDOM: 8.1 ug/mL

## 2024-07-18 LAB — ECG 12 LEAD
Calculated R Axis: 46 degrees
Calculated T Axis: 78 degrees
QRS Duration: 90 ms
QT Interval: 340 ms
QTC Calculation: 515 ms
Ventricular rate: 138 {beats}/min

## 2024-07-18 LAB — LACTIC ACID - FIRST REFLEX
LACTIC ACID: 5.3 mmol/L (ref 0.5–2.0)
LACTIC ACID: 5.9 mmol/L (ref 0.5–2.0)

## 2024-07-18 LAB — LACTIC ACID - SECOND REFLEX: LACTIC ACID: 4.7 mmol/L (ref 0.5–2.0)

## 2024-07-18 MED ORDER — AMIODARONE 360 MG/200 ML (1.8 MG/ML) IN DEXTROSE, ISO-OSMOTIC IV
INTRAVENOUS | Status: AC
Start: 2024-07-18 — End: 2024-07-18
  Administered 2024-07-18: 1 mg/min via INTRAVENOUS
  Filled 2024-07-18: qty 200

## 2024-07-18 MED ORDER — AMIODARONE 360 MG/200 ML (1.8 MG/ML) IN DEXTROSE, ISO-OSMOTIC IV
1.0000 mg/min | INTRAVENOUS | Status: AC
Start: 2024-07-18 — End: 2024-07-18
  Administered 2024-07-18: 0 mg/min via INTRAVENOUS
  Administered 2024-07-18 (×6): 1 mg/min via INTRAVENOUS
  Filled 2024-07-18: qty 200

## 2024-07-18 MED ORDER — AMIODARONE 150 MG/100 ML (1.5 MG/ML) IN DEXTROSE, ISO-OSMOTIC IV
150.0000 mg | Freq: Once | INTRAVENOUS | Status: AC
Start: 2024-07-18 — End: 2024-07-18
  Administered 2024-07-18: 0 mg via INTRAVENOUS
  Administered 2024-07-18: 150 mg via INTRAVENOUS
  Filled 2024-07-18: qty 100

## 2024-07-18 MED ORDER — AMIODARONE 150 MG/100 ML (1.5 MG/ML) IN DEXTROSE, ISO-OSMOTIC IV
INTRAVENOUS | Status: AC
Start: 2024-07-18 — End: 2024-07-19
  Filled 2024-07-18: qty 100

## 2024-07-18 MED ORDER — SODIUM CHLORIDE 0.9 % INTRAVENOUS SOLUTION
1000.0000 mg | INTRAVENOUS | Status: AC
Start: 2024-07-18 — End: 2024-07-18
  Administered 2024-07-18: 1000 mg via INTRAVENOUS
  Administered 2024-07-18: 0 mg via INTRAVENOUS
  Filled 2024-07-18: qty 10

## 2024-07-18 MED ORDER — AMIODARONE 360 MG/200 ML (1.8 MG/ML) IN DEXTROSE, ISO-OSMOTIC IV
0.5000 mg/min | INTRAVENOUS | Status: DC
Start: 2024-07-18 — End: 2024-07-19
  Administered 2024-07-18 – 2024-07-19 (×15): 0.5 mg/min via INTRAVENOUS
  Administered 2024-07-19: 0 mg/min via INTRAVENOUS
  Administered 2024-07-19 (×3): 0.5 mg/min via INTRAVENOUS
  Filled 2024-07-18 (×2): qty 200

## 2024-07-18 MED ORDER — VANCOMYCIN 5 GRAM INTRAVENOUS SOLUTION
20.0000 mg/kg | Freq: Once | INTRAVENOUS | Status: AC
Start: 2024-07-18 — End: 2024-07-18
  Administered 2024-07-18: 1250 mg via INTRAVENOUS
  Administered 2024-07-18: 0 mg via INTRAVENOUS
  Filled 2024-07-18: qty 12.5

## 2024-07-18 MED ORDER — AMIODARONE 150 MG/100 ML (1.5 MG/ML) IN DEXTROSE, ISO-OSMOTIC IV
150.0000 mg | Freq: Once | INTRAVENOUS | Status: AC
Start: 2024-07-18 — End: 2024-07-18
  Administered 2024-07-18: 0 mg via INTRAVENOUS
  Administered 2024-07-18: 150 mg via INTRAVENOUS

## 2024-07-18 MED ORDER — SODIUM CHLORIDE 0.9 % IV BOLUS
40.0000 mL | INJECTION | Freq: Once | Status: AC | PRN
Start: 2024-07-18 — End: 2024-07-18

## 2024-07-18 MED ORDER — FUROSEMIDE 10 MG/ML INJECTION SOLUTION
40.0000 mg | Freq: Two times a day (BID) | INTRAMUSCULAR | Status: DC
Start: 2024-07-18 — End: 2024-07-19
  Administered 2024-07-18 – 2024-07-19 (×3): 40 mg via INTRAVENOUS
  Filled 2024-07-18 (×3): qty 4

## 2024-07-18 MED ORDER — CHLOROTHIAZIDE SODIUM 500 MG INTRAVENOUS SOLUTION
1000.0000 mg | Freq: Once | INTRAVENOUS | Status: AC
Start: 2024-07-18 — End: 2024-07-18
  Administered 2024-07-18: 1000 mg via INTRAVENOUS
  Filled 2024-07-18: qty 35.72

## 2024-07-18 NOTE — Progress Notes (Signed)
 Patient Care Team: Theophilus Andrews, Tully GRADE, MD as PCP - General (Internal Medicine) Tyree Nanetta SAILOR, RN as Oncology Nurse Navigator Izell Domino, MD as Attending Physician (Radiation Oncology) Odean Potts, MD as Consulting Physician (Hematology and Oncology) Curvin Deward MOULD, MD as Consulting Physician (General Surgery)  DIAGNOSIS:  Encounter Diagnosis  Name Primary?   Malignant neoplasm of upper-inner quadrant of right breast in female, estrogen receptor positive (HCC) Yes    SUMMARY OF ONCOLOGIC HISTORY: Oncology History  Malignant neoplasm of upper-inner quadrant of right breast in female, estrogen receptor positive (HCC)  09/26/2018 Initial Diagnosis   Two right breast masses 12:00 to 1 o'clock position 1.3 cm and 1.2 cm, they are 1.5 cm apart.  Biopsy revealed grade 2 invasive ductal carcinoma ER 70%- 100%, PR 0% -20%, Ki-67 20%, HER-2 3+ by IHC and FISH ratio 2.15 with a gene copy number of 4.2 for the tumor that was 2+ by IHC, T1c N0 stage Ia   10/24/2018 Cancer Staging   Staging form: Breast, AJCC 8th Edition - Clinical stage from 10/24/2018: Stage IA (cT1c(2), cN0, cM0, G2, ER+, PR+, HER2+) - Signed by Odean Potts, MD on 10/24/2018   11/15/2018 - 10/24/2019 Chemotherapy   PACLitaxel -protein bound (ABRAXANE ) chemo infusion 150 mg, 80 mg/m2 = 150 mg (100 % of original dose 80 mg/m2), Intravenous,  Once, 3 of 3 cycles. Dose modification: 80 mg/m2 (original dose 80 mg/m2, Cycle 1). Administration: 150 mg (11/29/2018), 150 mg (12/06/2018), 150 mg (12/13/2018), 150 mg (12/20/2018), 150 mg (12/27/2018), 150 mg (01/03/2019), 150 mg (01/10/2019), 150 mg (01/17/2019), 150 mg (01/24/2019)  ondansetron  (ZOFRAN ) 8 mg in sodium chloride  0.9 % 50 mL IVPB, 8 mg (100 % of original dose 8 mg), Intravenous,  Once, 1 of 1 cycle. Dose modification: 8 mg (original dose 8 mg, Cycle 1)  trastuzumab  (HERCEPTIN ) 336 mg in sodium chloride  0.9 % 250 mL chemo infusion, 4 mg/kg = 336 mg, Intravenous,  Once, 5 of 16  cycles. Dose modification: 6 mg/kg (original dose 2 mg/kg, Cycle 3, Reason: Other (see comments), Comment: changing to q3week tx). Administration: 336 mg (11/08/2018), 168 mg (11/15/2018), 168 mg (12/06/2018), 504 mg (02/14/2019), 504 mg (03/05/2019), 168 mg (11/22/2018), 168 mg (11/29/2018), 168 mg (12/13/2018), 168 mg (12/20/2018), 168 mg (12/27/2018), 168 mg (01/03/2019), 168 mg (01/10/2019), 168 mg (01/17/2019), 504 mg (01/24/2019)  PACLitaxel  (TAXOL ) 156 mg in sodium chloride  0.9 % 250 mL chemo infusion (</= 80mg /m2), 80 mg/m2 = 156 mg, Intravenous,  Once, 1 of 1 cycle. Administration: 156 mg (11/08/2018), 156 mg (11/15/2018), 156 mg (11/22/2018)  ado-trastuzumab emtansine  (KADCYLA ) 300 mg in sodium chloride  0.9 % 250 mL chemo infusion, 3.6 mg/kg = 300 mg, Intravenous, Once, 11 of 11 cycles. Administration: 300 mg (03/28/2019), 300 mg (04/18/2019), 300 mg (06/21/2019), 300 mg (05/09/2019), 300 mg (05/30/2019), 300 mg (07/11/2019), 300 mg (08/01/2019), 300 mg (08/22/2019), 300 mg (09/12/2019), 260 mg (10/02/2019), 260 mg (10/24/2019)    01/09/2019 Genetic Testing   Negative genetic testing on the common hereditary cancer panel. The Hereditary Gene Panel offered by [facility] includes sequencing and/or deletion duplication testing of the following 48 genes: APC, ATM, AXIN2, BARD1, BMPR1A, BRCA1, BRCA2, BRIP1, CDH1, CDK4, CDKN2A (p14ARF), CDKN2A (p16INK4a), CHEK2, CTNNA1, DICER1, EPCAM (Deletion/duplication testing only), GREM1 (promoter region deletion/duplication testing only), KIT, MEN1, MLH1, MSH2, MSH3, MSH6, MUTYH, NBN, NF1, NHTL1, PALB2, PDGFRA, PMS2, POLD1, POLE, PTEN, RAD50, RAD51C, RAD51D, RNF43, SDHB, SDHC, SDHD, SMAD4, SMARCA4. STK11, TP53, TSC1, TSC2, and VHL.  The following genes were evaluated for sequence  changes only: SDHA and HOXB13 c.251G>A variant only. The report date is January 09, 2019.   03/08/2019 Surgery   Right lumpectomy [facility]) [phone]): IDC s/p neoadjuvant treatment, grade 2, 0.7cm, ER+ 100%, PR+ 5%, HER2  equivocal, Ki67 20%, 2 right axillary sentinel lymph nodes negative for carcinoma, with clear margins.    03/29/2019 Cancer Staging   Staging form: Breast, AJCC 8th Edition - Pathologic stage from 03/29/2019: No Stage Recommended (ypT1b, pN0, cM0, G2, ER+, PR+, HER2: Equivocal)     04/08/2019 - 05/06/2019 Radiation Therapy   The patient initially received a dose of 40.05 Gy in 15 fractions to the breast using whole-breast tangent fields. This was delivered using a 3-D conformal technique. The pt received a boost delivering an additional 10 Gy in 5 fractions using a electron boost with electrons. The total dose was 50.05 Gy.    09/2019 - 09/2024 Anti-estrogen oral therapy   Anastrozole      CHIEF COMPLIANT: F/U on anastrozole   HISTORY OF PRESENT ILLNESS:  History of Present Illness Judith Thomas is a 68 year old female with breast cancer who presents for a routine follow-up.  She has been on anastrozole  for four years and experiences hot flashes with variable severity, occurring sporadically without identifiable triggers. She reports no pain or discomfort related to her current treatment.  She is compliant with cancer screening, with mammograms up to date and the next one scheduled for February. A recent bone density test was performed.  Her physical activity includes walking her dog twice daily for 10-15 minutes and bowling twice a week. She has experienced some weight loss since her last visit, though the amount is unspecified.  She takes anastrozole  and mostly supplements, with no other medications reported.     ALLERGIES:  is allergic to paclitaxel .  MEDICATIONS:  Current Outpatient Medications  Medication Sig Dispense Refill   anastrozole  (ARIMIDEX ) 1 MG tablet TAKE 1 TABLET BY MOUTH EVERY DAY 90 tablet 3   Calcium Carbonate-Vit D-Min (CALCIUM 1200 PO) Take by mouth.     cholecalciferol (VITAMIN D3) 25 MCG (1000 UNIT) tablet Take 1,000 Units by mouth daily.      cyanocobalamin  (VITAMIN B12) 1000 MCG tablet Take 1,000 mcg by mouth daily.     No current facility-administered medications for this visit.    PHYSICAL EXAMINATION: ECOG PERFORMANCE STATUS: 1 - Symptomatic but completely ambulatory  Vitals:   07/18/24 0957  BP: 120/84  Pulse: 68  Resp: 18  Temp: 97.6 F (36.4 C)  SpO2: 100%   Filed Weights   07/18/24 0957  Weight: 182 lb 12.8 oz (82.9 kg)    Physical Exam No palpable lumps or nodules  (exam performed in the presence of a chaperone)  LABORATORY DATA:  I have reviewed the data as listed    Latest Ref Rng & Units 08/15/2023    8:24 AM 08/11/2022   10:58 AM 08/24/2021    8:25 AM  CMP  Glucose 70 - 99 mg/dL 94  92  96   BUN 6 - 23 mg/dL 12  9  13    Creatinine 0.40 - 1.20 mg/dL 9.21  9.26  9.27   Sodium 135 - 145 mEq/L 141  140  139   Potassium 3.5 - 5.1 mEq/L 3.9  4.9  4.2   Chloride 96 - 112 mEq/L 103  103  102   CO2 19 - 32 mEq/L 31  30  30    Calcium 8.4 - 10.5 mg/dL 9.5  9.9  9.5  Total Protein 6.0 - 8.3 g/dL 7.4  7.7  7.6   Total Bilirubin 0.2 - 1.2 mg/dL 0.8  0.7  0.7   Alkaline Phos 39 - 117 U/L 119  107  143   AST 0 - 37 U/L 15  15  15    ALT 0 - 35 U/L 10  9  12      Lab Results  Component Value Date   WBC 4.9 08/15/2023   HGB 12.7 08/15/2023   HCT 39.1 08/15/2023   MCV 95.0 08/15/2023   PLT 255.0 08/15/2023   NEUTROABS 3.2 08/15/2023    ASSESSMENT & PLAN:  Malignant neoplasm of upper-inner quadrant of right breast in female, estrogen receptor positive (HCC) 09/27/19: Two right breast masses 12:00 to 1 o'clock position 1.3 cm and 1.2 cm, they are 1.5 cm apart.  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 Neoadjuvant chemotherapy with Taxol  Herceptin  weekly x12 completed 01/24/2019 followed by Herceptin  maintenance vs Kadcyla   for 1 year completed 10/24/2019 2.  03/08/2019: Right lumpectomy IDC s/p neoadjuvant treatment, grade 2, 0.7cm, ER+ 100%, PR+ 5%, HER2 equivocal, Ki67 20%, 0/2 right axillary sentinel lymph nodes negative for carcinoma, with clear margins.  3. Followed by adjuvant radiation therapy completed 05/06/2019 4.  Followed by antiestrogen therapy started 10/02/2019 ------------------------------------------------------------------------------------------------------------------------------------------------------------ Current Treatment: Anastrozole  1 mg daily started 10/01/20   Anastrozole  Toxicities:  1.  Mild to moderate hot flashes 2. mild to moderate joint stiffness: Improved with exercise 3.  Weight issues:    Bone density  February 2025: T score -1.3.   Breast Cancer Surveillance: 1. Mammograms:  [facility] 11/24/2023: Benign, breast density category A 2. Breast Exam: 07/18/24: Benign   Bone density 11/24/2023: T score -0.8: Normal      Return to clinic in 1 year for follow-up     No orders of the defined types were placed in this encounter.  The patient has a good understanding of the overall plan. she agrees with it. she will call with any problems that may develop before the next visit here.  I personally spent a total of 30 minutes in the care of the patient today including preparing to see the patient, getting/reviewing separately obtained history, performing a medically appropriate exam/evaluation, counseling and educating, placing orders, referring and communicating with other health care professionals, documenting clinical information in the EHR, independently interpreting results, communicating results, and coordinating care.   Viinay K Brylen Wagar, MD 07/18/24

## 2024-07-18 NOTE — Assessment & Plan Note (Signed)
 09/27/19: Two right breast masses 12:00 to 1 o'clock position 1.3 cm and 1.2 cm, they are 1.5 cm apart.  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 Neoadjuvant chemotherapy with Taxol  Herceptin  weekly x12 completed 01/24/2019 followed by Herceptin  maintenance vs Kadcyla  for 1 year completed 10/24/2019 2.  03/08/2019: Right lumpectomy IDC s/p neoadjuvant treatment, grade 2, 0.7cm, ER+ 100%, PR+ 5%, HER2 equivocal, Ki67 20%, 0/2 right axillary sentinel lymph nodes negative for carcinoma, with clear margins.  3. Followed by adjuvant radiation therapy completed 05/06/2019 4.  Followed by antiestrogen therapy started 10/02/2019 ------------------------------------------------------------------------------------------------------------------------------------------------------------ Current Treatment: Anastrozole  1 mg daily started 10/01/20   Anastrozole  Toxicities:  1.  Mild to moderate hot flashes 2. mild to moderate joint stiffness: Improved with exercise 3.  Weight issues:    Bone density has been ordered for February 2023.   Breast Cancer Surveillance: 1. Mammograms:  [facility] 11/24/2023: Benign, breast density category A 2. Breast Exam: 07/18/24: Benign   Bone density 11/24/2023: T score -0.8: Normal      Return to clinic in 1 year for follow-up

## 2024-07-18 NOTE — Care Plan (Signed)
 Problem: Mechanical Ventilation Invasive  Goal: Effective Communication  Outcome: Ongoing (see interventions/notes)  Goal: Optimal Device Function  Outcome: Ongoing (see interventions/notes)  Goal: Mechanical Ventilation Liberation  Outcome: Ongoing (see interventions/notes)  Goal: Absence of Device-Related Skin and Tissue Injury  Outcome: Ongoing (see interventions/notes)  Goal: Absence of Ventilator-Induced Lung Injury  Outcome: Ongoing (see interventions/notes)  Intervention: Prevent Ventilator-Associated Pneumonia  Recent Flowsheet Documentation  Taken 07/17/2024 2231 by Powell BROCKS, RRT  Head of Bed [facility]) Positioning: HOB elevated  Taken 07/17/2024 1952 by Powell BROCKS, RRT  Head of Bed [facility]) Positioning: HOB elevated

## 2024-07-18 NOTE — Care Plan (Signed)
 Problem: Wound  Goal: Optimal Coping  Outcome: Ongoing (see interventions/notes)  Goal: Optimal Functional Ability  Outcome: Ongoing (see interventions/notes)  Intervention: Optimize Functional Ability  Recent Flowsheet Documentation  Taken 07/18/2024 1500 by Delon NOVAK, RN  Activity Management: bedrest  Taken 07/18/2024 1100 by Delon NOVAK, RN  Activity Management: bedrest  Taken 07/18/2024 0700 by Delon NOVAK, RN  Activity Management: bedrest  Goal: Absence of Infection Signs and Symptoms  Outcome: Ongoing (see interventions/notes)  Goal: Improved Oral Intake  Outcome: Ongoing (see interventions/notes)  Goal: Optimal Pain Control and Function  Outcome: Ongoing (see interventions/notes)  Goal: Skin Health and Integrity  Outcome: Ongoing (see interventions/notes)  Intervention: Optimize Skin Protection  Recent Flowsheet Documentation  Taken 07/18/2024 1500 by Delon NOVAK, RN  Pressure Reduction Techniques:   Heels elevated off of the bed   Patient turned q 2 hours   Mobility is maximized   Moisture, shear and nutrition are maximized   30 degree lateral position utilized   Supplemented with small shifts   Frequent weight shifting encouraged   Pressure points protected  Pressure Reduction Devices:   Heel offloading device utilized   Sacral dressing utilized if not incontinent of bowel  Activity Management: bedrest  Taken 07/18/2024 1100 by Delon NOVAK, RN  Pressure Reduction Techniques:   Heels elevated off of the bed   Patient turned q 2 hours   Mobility is maximized   Moisture, shear and nutrition are maximized   30 degree lateral position utilized   Supplemented with small shifts   Frequent weight shifting encouraged   Pressure points protected  Pressure Reduction Devices:   Heel offloading device utilized   Sacral dressing utilized if not incontinent of bowel  Activity Management: bedrest  Taken 07/18/2024 0700 by Delon NOVAK, RN  Pressure Reduction Techniques:   Heels elevated off of the bed   Patient turned q 2 hours    Mobility is maximized   Moisture, shear and nutrition are maximized   30 degree lateral position utilized   Supplemented with small shifts   Frequent weight shifting encouraged   Pressure points protected  Pressure Reduction Devices:   Heel offloading device utilized   Sacral dressing utilized if not incontinent of bowel  Activity Management: bedrest  Goal: Optimal Wound Healing  Outcome: Ongoing (see interventions/notes)  Intervention: Promote Wound Healing  Recent Flowsheet Documentation  Taken 07/18/2024 1500 by Delon NOVAK, RN  Pressure Reduction Techniques:   Heels elevated off of the bed   Patient turned q 2 hours   Mobility is maximized   Moisture, shear and nutrition are maximized   30 degree lateral position utilized   Supplemented with small shifts   Frequent weight shifting encouraged   Pressure points protected  Pressure Reduction Devices:   Heel offloading device utilized   Sacral dressing utilized if not incontinent of bowel  Activity Management: bedrest  Taken 07/18/2024 1100 by Delon NOVAK, RN  Pressure Reduction Techniques:   Heels elevated off of the bed   Patient turned q 2 hours   Mobility is maximized   Moisture, shear and nutrition are maximized   30 degree lateral position utilized   Supplemented with small shifts   Frequent weight shifting encouraged   Pressure points protected  Pressure Reduction Devices:   Heel offloading device utilized   Sacral dressing utilized if not incontinent of bowel  Activity Management: bedrest  Taken 07/18/2024 0700 by Delon NOVAK, RN  Pressure Reduction Techniques:   Heels elevated off of the  bed   Patient turned q 2 hours   Mobility is maximized   Moisture, shear and nutrition are maximized   30 degree lateral position utilized   Supplemented with small shifts   Frequent weight shifting encouraged   Pressure points protected  Pressure Reduction Devices:   Heel offloading device utilized   Sacral dressing utilized if not incontinent of bowel  Activity Management:  bedrest

## 2024-07-18 NOTE — Progress Notes (Addendum)
 [facility]    [name], [name], 68 y.o. female  Date of Admission:  07/08/2024  Date of Birth:  [birth year]    Information Obtained from: history reviewed via medical record      HPI: [name] is a 68 y.o., White female w/recently diagnosed Small-cell carcinoma w/liver mets, COPD, current smoker - not on O2 at home, CABG. She was found to have lung mass on last admission and underwent bronchoscopy w/bx of station 7 which revealed the small cell carcinoma. She came to hospital from outpatient infusion center to start chemotherapy but had dyspnea and hypoxia and was sent to ER for further evaluation. She was found to have COVID. She reports she's been feeling bad for the last few weeks. CTA chest - no PE, marginal increase in size of right infrahilar mass, right hilar and mediastinal lymphadenopathy compared to prior study. CT A/P: hepatomegaly, hepatic steatosis, and hepatic lesions not much change, new pericholecystic fluid now present. We are consulted for acute hypoxic respiratory failure, known patient.      [name] currently on 2 L NC/93%.  She is awake and alert.  Feels breathing is doing okay at present time.  Has some mild cough.  No chest pain.  No fevers noted.  No N/V/D.  No associated symptoms or modifying factors.      10/2: Patient on 3LNC/96%. Awake and alert. Feels breathing is improving. She is s/p lap chole. No chest pain. No fevers noted. No n/v/d.  No associated symptoms or modifying factors.      10/3: Patient on room air/97%. Denies shortness of breath. Not much cough. No chest pain. Having bowel movements. No obvious signs of bleeding. Hgb 6.2 this morning, getting blood transfusion.  No associated symptoms or modifying factors.      10/4:  Today on 3L NC.  Received 2 units PRBCs yesterday.  Hemoglobin up to 8.1 today.  Receiving breathing treatment on exam.  Patient states she is not feeling much better today.     10/5:  On 2 L NC.  Hemoglobin dropped  again this morning so had to get 1 unit PRBC.      10/6: Patient on 2LNC/92%. Fell last night. Moved to ICU for confusion and found to have small volume of acute hemorrhage in subdural space overlying the left frontal lobe anteriorly and superiorly. She is awake and alert. A/Ox 2, oriented to person, place. Daughter at bedside. Denies blurry vision. MAE x4 w/no weakness.  No associated symptoms or modifying factors.      10/7 - did well overnight.  No new issues reported.  Hemoglobin 6.5 and getting PRBC x1.  No bleeding noted.  CT head today shows small stable SDH.  Afebrile.  UIP adequate.  On 3 L/min supplemental oxygen.    10/8: Patient decompensated overnight requiring emergent intubation, initially maxed on 4 pressors, getting 2u PRBC and 2 FFP. HPI/ROS unable to be obtained d/t intubation and sedation.    10/9 - remains sedated and intubated on vent.  Titrating pressors. Developed Afib with RVR and NSVT. Started on amio drip.  T-max 38.3.  UO P adequate. Hb 6.9 and platelets 34 and is getting PRBC/FFP/platelets.    Past Medical History:   Diagnosis Date    Abdominal pain     Cancer (CMS HCC)     Chest pain     Chronic lung disease     Coronary artery disease     Dyslipidemia  Essential hypertension     Heart disease     History of percutaneous coronary intervention     History of stress test     Mixed hyperlipidemia     Unspecified chronic bronchitis          Past Surgical History:   Procedure Laterality Date    CARDIAC CATHETERIZATION      CORONARY ANGIOPLASTY      CORONARY ARTERY STENT PLACEMENT      HX BACK SURGERY      HX CORONARY ARTERY BYPASS GRAFT      HX MASTECTOMY, SIMPLE Bilateral     HX TUBAL LIGATION      NECK SURGERY           Medications Prior to Admission       Prescriptions    anastrozole  (ARIMIDEX ) 1 mg Oral Tablet    Take 1 Tablet (1 mg total) by mouth Daily    aspirin  81 mg Oral Tablet, Chewable    Chew 1 Tablet (81 mg total) Once a day    atorvastatin  (LIPITOR) 40 mg Oral Tablet     Take 1 Tablet (40 mg total) by mouth Daily    cholecalciferol , vitamin D3, 25 mcg (1,000 unit) Oral Tablet    Take 1 Tablet (1,000 Units total) by mouth Daily    clonazePAM  (KLONOPIN ) 0.5 mg Oral Tablet    Take 1 Tablet (0.5 mg total) by mouth Twice per day as needed for Other (ANXIETY)    DULoxetine  (CYMBALTA  DR) 60 mg Oral Capsule, Delayed Release(E.C.)    Take 1 Capsule (60 mg total) by mouth Daily    fluticasone  propion-salmeteroL (ADVAIR ) 250-50 mcg/dose Inhalation oral diskus inhaler    Take 1 Puff (1 Inhalation total) by inhalation Twice daily    fluticasone  propionate (FLONASE ) 50 mcg/actuation Nasal Spray, Suspension    1 Spray Once per day as needed for Other (CONGESTION)    gabapentin  (NEURONTIN ) 400 mg Oral Capsule    Take 1 Capsule (400 mg total) by mouth Three times a day    losartan  (COZAAR ) 25 mg Oral Tablet    Take 1 Tablet (25 mg total) by mouth Daily    metoprolol  tartrate (LOPRESSOR ) 25 mg Oral Tablet    Take 1 Tablet (25 mg total) by mouth Twice daily    OLANZapine  (ZYPREXA ) 5 mg Oral Tablet    Take 1 Tablet (5 mg total) by mouth Every night for 3 days    oxyCODONE -acetaminophen  (PERCOCET) 7.5-325 mg Oral Tablet    Take 1 Tablet by mouth Every 4 hours as needed for Pain    prochlorperazine  (COMPAZINE ) 10 mg Oral Tablet    Take 1 Tablet (10 mg total) by mouth Three times a day as needed for Nausea/Vomiting for up to 30 days    promethazine  (PHENERGAN ) 25 mg Oral Tablet    Take 1 Tablet (25 mg total) by mouth Every 8 hours as needed for Nausea/Vomiting    traZODone  (DESYREL ) 150 mg Oral Tablet    Take 1 Tablet (150 mg total) by mouth Every night          Allergies[1]  Social History     Socioeconomic History    Marital status: Widowed   Tobacco Use    Smoking status: Every Day     Current packs/day: 0.50     Average packs/day: 0.5 packs/day for 52.8 years (26.4 ttl pk-yrs)     Types: Cigarettes     Start date: 70    Smokeless  tobacco: Never   Vaping Use    Vaping status: Never Used   Substance  and Sexual Activity    Alcohol use: Yes     Comment: occasional    Drug use: Yes     Types: Marijuana     Social Determinants of Health     Social Connections: Low Risk (07/09/2024)    Social Connections     SDOH Social Isolation: 5 or more times a week     Family Medical History:       Problem Relation (Age of Onset)    Arthritis Mother, Maternal Grandmother    Cancer Father, Other    Heart Attack Father, Other    Heart Disease Brother, Other    Hypertension (High Blood Pressure) Mother, Father, Other    Pacemaker Mother             ROS: unable to be obtained d/t intubation and sedation       Physical Exam:  General: Chronically and acutely ill appearing female currently intubated and sedated   Eyes: Pupils equal and sluggish.  Scleral icterus and edema present.  HENT: Normocephalic, atraumatic.  Nose without erythema, polyps or rhinorrhea.  Mouth mucous membranes moist , Pharynx without injection or exudate, no oral lesions.   Neck: supple, no lymphadenopathy   Lungs:  Coarse bilateral.  No wheezing.  Mechanically ventilated.  Cardiovascular:  Atrial fibrillation RVR, no murmur, click, rub or gallop  Abdomen: soft, non-tender, non-distended, no organomegaly, no masses and no hernias  Extremities:  No gross deformities.  Skin:  Cool and dry.  No suspicious lesions.  Neurologic: intubated and sedated.  No commands.  Cough/gag with ETT suction present.  Corneal response present.  Psychiatric: intubated and sedated   Right IJ central line.    LABS:  I have reviewed all lab results.  Lab Results Today:    Results for orders placed or performed during the hospital encounter of 07/08/24 (from the past 24 hours)   PRODUCT: CRYOPRECIPITATE - UNITS   Result Value Ref Range    Coding System [id number]     UNIT NUMBER [id number]     BLOOD COMPONENT TYPE Thawed Pooled Cryoprecipitate (x5)     UNIT DIVISION 00     UNIT DISPENSE STATUS ISSUED,FINAL     TRANSFUSION STATUS OK TO TRANSFUSE     Product Code E3591V00     Coding System  [id number]     UNIT NUMBER [id number]     BLOOD COMPONENT TYPE Thawed Pooled Cryoprecipitate (x5)     UNIT DIVISION 00     UNIT DISPENSE STATUS ISSUED,FINAL     TRANSFUSION STATUS OK TO TRANSFUSE     Product Code E3591V00    CBC   Result Value Ref Range    WBC 5.5 3.7 - 11.0 x10^3/uL    RBC 3.16 (L) 3.85 - 5.22 x10^6/uL    HGB 9.2 (L) 11.5 - 16.0 g/dL    HCT 73.1 (L) 65.1 - 46.0 %    MCV 84.8 78.0 - 100.0 fL    MCH 29.1 26.0 - 32.0 pg    MCHC 34.3 31.0 - 35.5 g/dL    RDW-CV 85.0 88.4 - 84.4 %    PLATELETS 49 (L) 150 - 400 x10^3/uL    MPV 10.1 8.7 - 12.5 fL   COMPREHENSIVE METABOLIC PANEL, NON-FASTING   Result Value Ref Range    SODIUM 149 (H) 133 - 144 mmol/L    POTASSIUM 3.7 3.2 - 5.0 mmol/L  CHLORIDE 103 96 - 106 mmol/L    CO2 TOTAL 26 22 - 30 mmol/L    ANION GAP 20 (H) 7 - 18 mmol/L    BUN 56 (H) 8 - 23 mg/dL    CREATININE 8.53 (H) 0.50 - 0.90 mg/dL    ESTIMATED GFR 39 (L) >90 mL/min/1.65m^2    ALBUMIN 3.0 (L) 3.5 - 5.2 g/dL    CALCIUM 9.4 8.3 - 89.2 mg/dL    GLUCOSE 849 (H) 74 - 109 mg/dL    ALKALINE PHOSPHATASE 297 (H) 35 - 129 U/L    ALT (SGPT) 1,038 (H) 0 - 33 U/L    AST (SGOT) 2,763 (H) 0 - 32 U/L    BILIRUBIN TOTAL 5.7 (H) 0.2 - 1.2 mg/dL    PROTEIN TOTAL 5.2 (L) 6.4 - 8.3 g/dL   IONIZED CALCIUM WITH PH   Result Value Ref Range    PH (VENOUS) 7.44 (H) 7.32 - 7.43    IONIZED CALCIUM 1.20 1.15 - 1.33 mmol/L   MAGNESIUM    Result Value Ref Range    MAGNESIUM  1.8 1.6 - 2.4 mg/dL   PHOSPHORUS   Result Value Ref Range    PHOSPHORUS 4.7 (H) 2.5 - 4.5 mg/dL   PT/INR   Result Value Ref Range    PROTHROMBIN TIME 21.4 (H) 12.1 - 15.3 seconds    INR 1.84 (H) 0.86 - 1.14   PTT (PARTIAL THROMBOPLASTIN TIME)   Result Value Ref Range    APTT 41.8 (H) 22.4 - 34.8 seconds   RESPIRATORY CULTURE AND GRAM STAIN (PERFORMABLE)    Specimen: Sputum   Result Value Ref Range    GRAM STAIN 4+ Many WBCs     GRAM STAIN 2+ Few Gram Positive Cocci in Pairs     GRAM STAIN 2+ Few Hyphae     GRAM STAIN 2+ Few Yeast    BLOOD GAS W/ CO-OX,  LYTES, LACTATE REFLEX Arterial   Result Value Ref Range    %FIO2 (ARTERIAL) 40 %    PH (ARTERIAL) 7.41 7.35 - 7.45    PCO2 (ARTERIAL) 47 (H) 35 - 45 mm/Hg    PO2 (ARTERIAL) 69 (L) 83 - 108 mm/Hg    BICARBONATE (ARTERIAL) 28.3 (H) 21.0 - 28.0 mmol/L    BASE EXCESS (ARTERIAL) 4.4 (H) 0.0 - 3.0 mmol/L    PAO2/FIO2 RATIO 173     O2 SATURATION (ARTERIAL) 93.7 94.0 - 98.0 %    HEMOGLOBIN 11.1 (L) 12.0 - 18.0 g/dL    HEMATOCRITRT 33 (L) 37 - 50 %    OXYHEMOGLOBIN 94.8 90.0 - 95.0 %    CARBOXYHEMOGLOBIN 1.9 <=3.0 %    MET-HEMOGLOBIN <0.7 <=1.5 %    O2CT 14.8 %    SODIUM 143 136 - 145 mmol/L    WHOLE BLOOD POTASSIUM 3.6 3.5 - 5.1 mmol/L    CHLORIDE 104 98 - 107 mmol/L    IONIZED CALCIUM 1.21 1.15 - 1.33 mmol/L    GLUCOSE 138 (H) 65 - 125 mg/dL    LACTATE 5.0 (HH) <=8.0 mmol/L   POC BLOOD GLUCOSE (RESULTS)   Result Value Ref Range    GLUCOSE, POC 188 (H) 70 - 100 mg/dl   CBC   Result Value Ref Range    WBC 6.2 3.7 - 11.0 x10^3/uL    RBC 3.26 (L) 3.85 - 5.22 x10^6/uL    HGB 9.3 (L) 11.5 - 16.0 g/dL    HCT 72.2 (L) 65.1 - 46.0 %    MCV 85.0 78.0 - 100.0  fL    MCH 28.5 26.0 - 32.0 pg    MCHC 33.6 31.0 - 35.5 g/dL    RDW-CV 84.3 (H) 88.4 - 15.5 %    PLATELETS 45 (L) 150 - 400 x10^3/uL   COMPREHENSIVE METABOLIC PANEL, NON-FASTING   Result Value Ref Range    SODIUM 148 (H) 133 - 144 mmol/L    POTASSIUM 3.5 3.2 - 5.0 mmol/L    CHLORIDE 102 96 - 106 mmol/L    CO2 TOTAL 28 22 - 30 mmol/L    ANION GAP 18 7 - 18 mmol/L    BUN 60 (H) 8 - 23 mg/dL    CREATININE 8.34 (H) 0.50 - 0.90 mg/dL    ESTIMATED GFR 34 (L) >90 mL/min/1.66m^2    ALBUMIN 2.9 (L) 3.5 - 5.2 g/dL    CALCIUM 9.4 8.3 - 89.2 mg/dL    GLUCOSE 847 (H) 74 - 109 mg/dL    ALKALINE PHOSPHATASE 308 (H) 35 - 129 U/L    ALT (SGPT) 1,143 (H) 0 - 33 U/L    AST (SGOT) 3,018 (H) 0 - 32 U/L    BILIRUBIN TOTAL 6.4 (H) 0.2 - 1.2 mg/dL    PROTEIN TOTAL 5.2 (L) 6.4 - 8.3 g/dL   IONIZED CALCIUM WITH PH   Result Value Ref Range    PH (VENOUS) 7.45 (H) 7.32 - 7.43    IONIZED CALCIUM 1.20 1.15 -  1.33 mmol/L   MAGNESIUM    Result Value Ref Range    MAGNESIUM  1.8 1.6 - 2.4 mg/dL   PHOSPHORUS   Result Value Ref Range    PHOSPHORUS 4.1 2.5 - 4.5 mg/dL   PT/INR   Result Value Ref Range    PROTHROMBIN TIME 22.8 (H) 12.1 - 15.3 seconds    INR 2.00 (H) 0.86 - 1.14   PTT (PARTIAL THROMBOPLASTIN TIME)   Result Value Ref Range    APTT 46.9 (H) 22.4 - 34.8 seconds   BLOOD GAS W/ CO-OX, LYTES, LACTATE REFLEX Arterial   Result Value Ref Range    %FIO2 (ARTERIAL) 50 %    PH (ARTERIAL) 7.45 7.35 - 7.45    PCO2 (ARTERIAL) 42 35 - 45 mm/Hg    PO2 (ARTERIAL) 96 83 - 108 mm/Hg    BICARBONATE (ARTERIAL) 28.6 (H) 21.0 - 28.0 mmol/L    BASE EXCESS (ARTERIAL) 4.7 (H) 0.0 - 3.0 mmol/L    PAO2/FIO2 RATIO 192     O2 SATURATION (ARTERIAL) 97.8 94.0 - 98.0 %    HEMOGLOBIN 9.8 (L) 12.0 - 18.0 g/dL    HEMATOCRITRT 29 (L) 37 - 50 %    OXYHEMOGLOBIN 96.5 (H) 90.0 - 95.0 %    CARBOXYHEMOGLOBIN 1.4 <=3.0 %    MET-HEMOGLOBIN 1.2 <=1.5 %    O2CT 13.4 %    SODIUM 145 136 - 145 mmol/L    WHOLE BLOOD POTASSIUM 3.4 (L) 3.5 - 5.1 mmol/L    CHLORIDE 104 98 - 107 mmol/L    IONIZED CALCIUM 1.20 1.15 - 1.33 mmol/L    GLUCOSE 138 (H) 65 - 125 mg/dL    LACTATE 3.9 (H) <=8.0 mmol/L   PRODUCT: FFP/PLASMA - UNITS : 1 Units   Result Value Ref Range    Coding System [id number]     UNIT NUMBER [id number]     BLOOD COMPONENT TYPE THAWED PLASMA     UNIT DIVISION 00     UNIT DISPENSE STATUS ISSUED     TRANSFUSION STATUS OK TO TRANSFUSE     Product Code  [id number]    CBC   Result Value Ref Range    WBC 7.2 3.7 - 11.0 x10^3/uL    RBC 3.11 (L) 3.85 - 5.22 x10^6/uL    HGB 8.7 (L) 11.5 - 16.0 g/dL    HCT 73.7 (L) 65.1 - 46.0 %    MCV 84.2 78.0 - 100.0 fL    MCH 28.0 26.0 - 32.0 pg    MCHC 33.2 31.0 - 35.5 g/dL    RDW-CV 84.3 (H) 88.4 - 15.5 %    PLATELETS 50 (L) 150 - 400 x10^3/uL   COMPREHENSIVE METABOLIC PANEL, NON-FASTING   Result Value Ref Range    SODIUM 150 (H) 133 - 144 mmol/L    POTASSIUM 3.6 3.2 - 5.0 mmol/L    CHLORIDE 103 96 - 106 mmol/L    CO2 TOTAL 26 22 -  30 mmol/L    ANION GAP 21 (H) 7 - 18 mmol/L    BUN 61 (H) 8 - 23 mg/dL    CREATININE 8.38 (H) 0.50 - 0.90 mg/dL    ESTIMATED GFR 35 (L) >90 mL/min/1.27m^2    ALBUMIN 2.9 (L) 3.5 - 5.2 g/dL    CALCIUM 9.2 8.3 - 89.2 mg/dL    GLUCOSE 857 (H) 74 - 109 mg/dL    ALKALINE PHOSPHATASE 326 (H) 35 - 129 U/L    ALT (SGPT) 1,146 (H) 0 - 33 U/L    AST (SGOT) 2,963 (H) 0 - 32 U/L    BILIRUBIN TOTAL 6.4 (H) 0.2 - 1.2 mg/dL    PROTEIN TOTAL 5.0 (L) 6.4 - 8.3 g/dL   IONIZED CALCIUM WITH PH   Result Value Ref Range    PH (VENOUS) 7.46 (H) 7.32 - 7.43    IONIZED CALCIUM 1.17 1.15 - 1.33 mmol/L   MAGNESIUM    Result Value Ref Range    MAGNESIUM  1.9 1.6 - 2.4 mg/dL   PHOSPHORUS   Result Value Ref Range    PHOSPHORUS 4.1 2.5 - 4.5 mg/dL   PT/INR   Result Value Ref Range    PROTHROMBIN TIME 22.3 (H) 12.1 - 15.3 seconds    INR 1.94 (H) 0.86 - 1.14   PTT (PARTIAL THROMBOPLASTIN TIME)   Result Value Ref Range    APTT 44.8 (H) 22.4 - 34.8 seconds   LACTIC ACID - FIRST REFLEX   Result Value Ref Range    LACTIC ACID 5.3 (HH) 0.5 - 2.0 mmol/L   BLOOD GAS W/ CO-OX, LYTES, LACTATE REFLEX Arterial   Result Value Ref Range    %FIO2 (ARTERIAL) 40 %    PH (ARTERIAL) 7.44 7.35 - 7.45    PCO2 (ARTERIAL) 42 35 - 45 mm/Hg    PO2 (ARTERIAL) 78 (L) 83 - 108 mm/Hg    BICARBONATE (ARTERIAL) 28.0 21.0 - 28.0 mmol/L    BASE EXCESS (ARTERIAL) 3.9 (H) 0.0 - 3.0 mmol/L    PAO2/FIO2 RATIO 195     O2 SATURATION (ARTERIAL) 95.9 94.0 - 98.0 %    HEMOGLOBIN 9.3 (L) 12.0 - 18.0 g/dL    HEMATOCRITRT 28 (L) 37 - 50 %    OXYHEMOGLOBIN 95.6 (H) 90.0 - 95.0 %    CARBOXYHEMOGLOBIN 1.5 <=3.0 %    MET-HEMOGLOBIN 0.8 <=1.5 %    O2CT 12.6 %    SODIUM 145 136 - 145 mmol/L    WHOLE BLOOD POTASSIUM 3.4 (L) 3.5 - 5.1 mmol/L    CHLORIDE 104 98 - 107 mmol/L    IONIZED CALCIUM 1.19 1.15 - 1.33 mmol/L  GLUCOSE 118 65 - 125 mg/dL    LACTATE 4.7 (HH) <=8.0 mmol/L   POC BLOOD GLUCOSE (RESULTS)   Result Value Ref Range    GLUCOSE, POC 136 (H) 70 - 100 mg/dl   CBC   Result Value Ref Range     WBC 6.2 3.7 - 11.0 x10^3/uL    RBC 2.83 (L) 3.85 - 5.22 x10^6/uL    HGB 8.0 (L) 11.5 - 16.0 g/dL    HCT 75.7 (L) 65.1 - 46.0 %    MCV 85.5 78.0 - 100.0 fL    MCH 28.3 26.0 - 32.0 pg    MCHC 33.1 31.0 - 35.5 g/dL    RDW-CV 84.2 (H) 88.4 - 15.5 %    PLATELETS 36 (L) 150 - 400 x10^3/uL    MPV 11.3 8.7 - 12.5 fL   COMPREHENSIVE METABOLIC PANEL, NON-FASTING   Result Value Ref Range    SODIUM 148 (H) 133 - 144 mmol/L    POTASSIUM 4.1 3.2 - 5.0 mmol/L    CHLORIDE 103 96 - 106 mmol/L    CO2 TOTAL 25 22 - 30 mmol/L    ANION GAP 20 (H) 7 - 18 mmol/L    BUN 61 (H) 8 - 23 mg/dL    CREATININE 8.41 (H) 0.50 - 0.90 mg/dL    ESTIMATED GFR 36 (L) >90 mL/min/1.34m^2    ALBUMIN 3.0 (L) 3.5 - 5.2 g/dL    CALCIUM 9.1 8.3 - 89.2 mg/dL    GLUCOSE 859 (H) 74 - 109 mg/dL    ALKALINE PHOSPHATASE 305 (H) 35 - 129 U/L    ALT (SGPT) 1,130 (H) 0 - 33 U/L    AST (SGOT) 2,911 (H) 0 - 32 U/L    BILIRUBIN TOTAL 6.8 (H) 0.2 - 1.2 mg/dL    PROTEIN TOTAL 5.1 (L) 6.4 - 8.3 g/dL   IONIZED CALCIUM WITH PH   Result Value Ref Range    PH (VENOUS) 7.42 7.32 - 7.43    IONIZED CALCIUM 1.14 (L) 1.15 - 1.33 mmol/L   MAGNESIUM    Result Value Ref Range    MAGNESIUM  2.4 1.6 - 2.4 mg/dL   PHOSPHORUS   Result Value Ref Range    PHOSPHORUS 4.3 2.5 - 4.5 mg/dL   PT/INR   Result Value Ref Range    PROTHROMBIN TIME 23.8 (H) 12.1 - 15.3 seconds    INR 2.12 (H) 0.86 - 1.14   PTT (PARTIAL THROMBOPLASTIN TIME)   Result Value Ref Range    APTT 47.0 (H) 22.4 - 34.8 seconds   LACTIC ACID - FIRST REFLEX   Result Value Ref Range    LACTIC ACID 5.9 (HH) 0.5 - 2.0 mmol/L   BLOOD GAS W/ CO-OX, LYTES, LACTATE REFLEX Arterial   Result Value Ref Range    %FIO2 (ARTERIAL) 40 %    PH (ARTERIAL) 7.40 7.35 - 7.45    PCO2 (ARTERIAL) 44 35 - 45 mm/Hg    PO2 (ARTERIAL) 81 (L) 83 - 108 mm/Hg    BICARBONATE (ARTERIAL) 26.6 21.0 - 28.0 mmol/L    BASE EXCESS (ARTERIAL) 2.2 0.0 - 3.0 mmol/L    PAO2/FIO2 RATIO 203     O2 SATURATION (ARTERIAL) 95.9 94.0 - 98.0 %    HEMOGLOBIN 8.6 (L) 12.0 - 18.0  g/dL    HEMATOCRITRT 26 (L) 37 - 50 %    OXYHEMOGLOBIN 95.1 (H) 90.0 - 95.0 %    CARBOXYHEMOGLOBIN 1.4 <=3.0 %    MET-HEMOGLOBIN 1.1 <=1.5 %    O2CT  11.6 %    SODIUM 143 136 - 145 mmol/L    WHOLE BLOOD POTASSIUM 4.0 3.5 - 5.1 mmol/L    CHLORIDE 105 98 - 107 mmol/L    IONIZED CALCIUM 1.19 1.15 - 1.33 mmol/L    GLUCOSE 135 (H) 65 - 125 mg/dL    LACTATE 5.9 (HH) <=8.0 mmol/L   POC BLOOD GLUCOSE (RESULTS)   Result Value Ref Range    GLUCOSE, POC 149 (H) 70 - 100 mg/dl   PRODUCT: FFP/PLASMA - UNITS : 3 Units   Result Value Ref Range    Coding System [id number]     UNIT NUMBER [id number]     BLOOD COMPONENT TYPE THAWED PLASMA     UNIT DIVISION 00     UNIT DISPENSE STATUS ISSUED     TRANSFUSION STATUS OK TO TRANSFUSE     Product Code [id number]    VANCOMYCIN, RANDOM   Result Value Ref Range    VANCOMYCIN RANDOM 8.1 See Comment ug/mL    DOSE DATE 07/18/2024    LACTIC ACID - SECOND REFLEX   Result Value Ref Range    LACTIC ACID 4.7 (HH) 0.5 - 2.0 mmol/L   CBC   Result Value Ref Range    WBC 6.2 3.7 - 11.0 x10^3/uL    RBC 2.63 (L) 3.85 - 5.22 x10^6/uL    HGB 7.5 (L) 11.5 - 16.0 g/dL    HCT 77.1 (L) 65.1 - 46.0 %    MCV 86.7 78.0 - 100.0 fL    MCH 28.5 26.0 - 32.0 pg    MCHC 32.9 31.0 - 35.5 g/dL    RDW-CV 84.0 (H) 88.4 - 15.5 %    PLATELETS 35 (L) 150 - 400 x10^3/uL    MPV 10.1 8.7 - 12.5 fL   COMPREHENSIVE METABOLIC PANEL, NON-FASTING   Result Value Ref Range    SODIUM 149 (H) 133 - 144 mmol/L    POTASSIUM 3.6 3.2 - 5.0 mmol/L    CHLORIDE 101 96 - 106 mmol/L    CO2 TOTAL 27 22 - 30 mmol/L    ANION GAP 21 (H) 7 - 18 mmol/L    BUN 63 (H) 8 - 23 mg/dL    CREATININE 8.39 (H) 0.50 - 0.90 mg/dL    ESTIMATED GFR 35 (L) >90 mL/min/1.30m^2    ALBUMIN 3.1 (L) 3.5 - 5.2 g/dL    CALCIUM 9.1 8.3 - 89.2 mg/dL    GLUCOSE 824 (H) 74 - 109 mg/dL    ALKALINE PHOSPHATASE 294 (H) 35 - 129 U/L    ALT (SGPT) 1,082 (H) 0 - 33 U/L    AST (SGOT) 2,707 (H) 0 - 32 U/L    BILIRUBIN TOTAL 6.6 (H) 0.2 - 1.2 mg/dL    PROTEIN TOTAL 5.3 (L) 6.4 - 8.3 g/dL    IONIZED CALCIUM WITH PH   Result Value Ref Range    PH (VENOUS) 7.38 7.32 - 7.43    IONIZED CALCIUM 1.10 (L) 1.15 - 1.33 mmol/L   MAGNESIUM    Result Value Ref Range    MAGNESIUM  2.2 1.6 - 2.4 mg/dL   PHOSPHORUS   Result Value Ref Range    PHOSPHORUS 4.2 2.5 - 4.5 mg/dL   PT/INR   Result Value Ref Range    PROTHROMBIN TIME 22.6 (H) 12.1 - 15.3 seconds    INR 1.97 (H) 0.86 - 1.14   PTT (PARTIAL THROMBOPLASTIN TIME)   Result Value Ref Range    APTT 46.5 (H) 22.4 - 34.8 seconds  BLOOD GAS W/ CO-OX, LYTES, LACTATE REFLEX Arterial   Result Value Ref Range    %FIO2 (ARTERIAL) 40 %    PH (ARTERIAL) 7.39 7.35 - 7.45    PCO2 (ARTERIAL) 49 (H) 35 - 45 mm/Hg    PO2 (ARTERIAL) 95 83 - 108 mm/Hg    BICARBONATE (ARTERIAL) 28.2 (H) 21.0 - 28.0 mmol/L    BASE EXCESS (ARTERIAL) 4.2 (H) 0.0 - 3.0 mmol/L    PAO2/FIO2 RATIO 238     O2 SATURATION (ARTERIAL) 97.3 94.0 - 98.0 %    HEMOGLOBIN 8.0 (L) 12.0 - 18.0 g/dL    HEMATOCRITRT 24 (L) 37 - 50 %    OXYHEMOGLOBIN 97.3 (H) 90.0 - 95.0 %    CARBOXYHEMOGLOBIN 1.4 <=3.0 %    MET-HEMOGLOBIN <0.7 <=1.5 %    O2CT 11.1 %    SODIUM 144 136 - 145 mmol/L    WHOLE BLOOD POTASSIUM 3.5 3.5 - 5.1 mmol/L    CHLORIDE 104 98 - 107 mmol/L    IONIZED CALCIUM 1.17 1.15 - 1.33 mmol/L    GLUCOSE 165 (H) 65 - 125 mg/dL    LACTATE 4.8 (HH) <=8.0 mmol/L   COMPREHENSIVE METABOLIC PANEL, NON-FASTING   Result Value Ref Range    SODIUM 150 (H) 133 - 144 mmol/L    POTASSIUM 3.5 3.2 - 5.0 mmol/L    CHLORIDE 102 96 - 106 mmol/L    CO2 TOTAL 27 22 - 30 mmol/L    ANION GAP 21 (H) 7 - 18 mmol/L    BUN 64 (H) 8 - 23 mg/dL    CREATININE 8.39 (H) 0.50 - 0.90 mg/dL    ESTIMATED GFR 35 (L) >90 mL/min/1.46m^2    ALBUMIN 3.2 (L) 3.5 - 5.2 g/dL    CALCIUM 9.2 8.3 - 89.2 mg/dL    GLUCOSE 830 (H) 74 - 109 mg/dL    ALKALINE PHOSPHATASE 308 (H) 35 - 129 U/L    ALT (SGPT) 1,053 (H) 0 - 33 U/L    AST (SGOT) 2,598 (H) 0 - 32 U/L    BILIRUBIN TOTAL 6.6 (H) 0.2 - 1.2 mg/dL    PROTEIN TOTAL 5.3 (L) 6.4 - 8.3 g/dL   CBC   Result Value Ref  Range    WBC 6.2 3.7 - 11.0 x10^3/uL    RBC 2.45 (L) 3.85 - 5.22 x10^6/uL    HGB 6.9 (LL) 11.5 - 16.0 g/dL    HCT 79.2 (L) 65.1 - 46.0 %    MCV 86.5 78.0 - 100.0 fL    MCH 28.2 26.0 - 32.0 pg    MCHC 32.5 31.0 - 35.5 g/dL    RDW-CV 84.0 (H) 88.4 - 15.5 %    PLATELETS 34 (L) 150 - 400 x10^3/uL    MPV 10.3 8.7 - 12.5 fL   MAGNESIUM    Result Value Ref Range    MAGNESIUM  2.1 1.6 - 2.4 mg/dL   PHOSPHORUS   Result Value Ref Range    PHOSPHORUS 4.3 2.5 - 4.5 mg/dL   COMPREHENSIVE METABOLIC PANEL, NON-FASTING   Result Value Ref Range    SODIUM 148 (H) 133 - 144 mmol/L    POTASSIUM 3.6 3.2 - 5.0 mmol/L    CHLORIDE 103 96 - 106 mmol/L    CO2 TOTAL 25 22 - 30 mmol/L    ANION GAP 20 (H) 7 - 18 mmol/L    BUN 62 (H) 8 - 23 mg/dL    CREATININE 8.33 (H) 0.50 - 0.90 mg/dL    ESTIMATED GFR  34 (L) >90 mL/min/1.74m^2    ALBUMIN 3.1 (L) 3.5 - 5.2 g/dL    CALCIUM 8.8 8.3 - 89.2 mg/dL    GLUCOSE 816 (H) 74 - 109 mg/dL    ALKALINE PHOSPHATASE 267 (H) 35 - 129 U/L    ALT (SGPT) 1,047 (H) 0 - 33 U/L    AST (SGOT) 2,562 (H) 0 - 32 U/L    BILIRUBIN TOTAL 7.0 (H) 0.2 - 1.2 mg/dL    PROTEIN TOTAL 5.2 (L) 6.4 - 8.3 g/dL   IONIZED CALCIUM WITH PH   Result Value Ref Range    PH (VENOUS) 7.33 7.32 - 7.43    IONIZED CALCIUM 1.12 (L) 1.15 - 1.33 mmol/L   MAGNESIUM    Result Value Ref Range    MAGNESIUM  2.0 1.6 - 2.4 mg/dL   PHOSPHORUS   Result Value Ref Range    PHOSPHORUS 4.6 (H) 2.5 - 4.5 mg/dL   PT/INR   Result Value Ref Range    PROTHROMBIN TIME 22.8 (H) 12.1 - 15.3 seconds    INR 2.00 (H) 0.86 - 1.14   PTT (PARTIAL THROMBOPLASTIN TIME)   Result Value Ref Range    APTT 45.9 (H) 22.4 - 34.8 seconds   BLOOD GAS W/ CO-OX, LYTES, LACTATE REFLEX Arterial   Result Value Ref Range    %FIO2 (ARTERIAL) 50 %    PH (ARTERIAL) 7.32 (L) 7.35 - 7.45    PCO2 (ARTERIAL) 57 (H) 35 - 45 mm/Hg    PO2 (ARTERIAL) 139 (H) 83 - 108 mm/Hg    BICARBONATE (ARTERIAL) 27.1 21.0 - 28.0 mmol/L    BASE EXCESS (ARTERIAL) 2.7 0.0 - 3.0 mmol/L    PAO2/FIO2 RATIO 278     O2  SATURATION (ARTERIAL) 99.0 94.0 - 98.0 %    HEMOGLOBIN 8.1 (L) 12.0 - 18.0 g/dL    HEMATOCRITRT 24 (L) 37 - 50 %    OXYHEMOGLOBIN 98.0 (H) 90.0 - 95.0 %    CARBOXYHEMOGLOBIN 1.7 <=3.0 %    MET-HEMOGLOBIN <0.7 <=1.5 %    O2CT 11.5 %    SODIUM 144 136 - 145 mmol/L    WHOLE BLOOD POTASSIUM 3.5 3.5 - 5.1 mmol/L    CHLORIDE 106 98 - 107 mmol/L    IONIZED CALCIUM 1.18 1.15 - 1.33 mmol/L    GLUCOSE 176 (H) 65 - 125 mg/dL    LACTATE 5.9 (HH) <=8.0 mmol/L       Radiology Results: CT CHEST ABDOMEN PELVIS WO IV CONTRAST  Result Date: 07/17/2024  Impression 1. Large volume hemoperitoneum, increased from 07/13/2024 CT. Persistent hyperattenuating material in the gallbladder fossa. Correlate with concern for ongoing extravasation. 2. Groundglass opacities in the right greater than left upper lobes new from prior CT suspicious for developing multifocal infectious process. 3. Endotracheal tube terminates above the carina but insinuates towards the right mainstem bronchus; consider slight retraction. Enteric tube terminating in the gastric body. 4. Right infrahilar mass poorly delineated on noncontrast examination, similar in size to 07/08/2024 CT. Increasing adjacent groundglass opacity and nodularity which could represent infectious/inflammatory process versus progression. 5. Additional chronic findings as above. A Critical Red actionable finding has been sent via the [employer] Actionable Findings application on 07/17/2024 3:57 AM, Message ID [id number]. Receipt of this communication will be communicated to [facility] RADIOLOGY STAFF or responsible provider and will be documented in [employer] Actionable Findings System upon receiving the acknowledgement. Radiologist location ID: [id number]     CT BRAIN WO IV CONTRAST  Result Date: 07/17/2024  Impression Small-volume left-sided  subdural hemorrhage similar to prior CT. Negative for new or increasing blood products. Radiologist location ID: [id number]     XR CHEST AP  Result Date:  07/17/2024  Impression 1. Right IJ central venous catheter in appropriate position. 2. Chronic interstitial changes similar to prior radiograph; superimposed infectious process difficult to exclude. Radiologist location ID: [id number]      XR AP MOBILE CHEST   Final Result   Support apparatus. Increasing bilateral consolidation.      .   .   .   s/d/g               Radiologist location ID: [id number]         CT CHEST ABDOMEN PELVIS WO IV CONTRAST   Final Result   Abnormal      1. Large volume hemoperitoneum, increased from 07/13/2024 CT. Persistent hyperattenuating material in the gallbladder fossa. Correlate with concern for ongoing extravasation.   2. Groundglass opacities in the right greater than left upper lobes new from prior CT suspicious for developing multifocal infectious process.    3. Endotracheal tube terminates above the carina but insinuates towards the right mainstem bronchus; consider slight retraction. Enteric tube terminating in the gastric body.   4. Right infrahilar mass poorly delineated on noncontrast examination, similar in size to 07/08/2024 CT. Increasing adjacent groundglass opacity and nodularity which could represent infectious/inflammatory process versus progression.   5. Additional chronic findings as above.         A Critical Red actionable finding has been sent via the [employer] Actionable Findings application on 07/17/2024 3:57 AM, Message ID [id number]. Receipt of this communication will be communicated to [facility] RADIOLOGY STAFF or responsible provider and will be documented in [employer] Actionable Findings System upon receiving the acknowledgement.         Radiologist location ID: [id number]         CT BRAIN WO IV CONTRAST   Final Result   Small-volume left-sided subdural hemorrhage similar to prior CT. Negative for new or increasing blood products.            Radiologist location ID: [id number]         XR CHEST AP   Final Result      1. Right IJ central venous catheter in  appropriate position.   2. Chronic interstitial changes similar to prior radiograph; superimposed infectious process difficult to exclude.         Radiologist location ID: [id number]         NUC CHOLESCINTIGRAPHY HEPATO IMAGING WO EF   Final Result   * IMPRESSION:      * Study indicating hepatic dysfunction with delay in activity within the biliary system, activity within the expected region of the right paracolic gutter on 4 hour study is worrisome for but not clearly diagnostic of a bile duct leak, ERCP with positive contrast evaluation may certainly be of value         Radiologist location ID: [id number]         CT BRAIN WO IV CONTRAST   Final Result   Stable exam   .   .   .   s/d/g            Radiologist location ID: [id number]         CT BRAIN WO IV CONTRAST   Final Result   Abnormal   Small volume of acute hemorrhage in the subdural space overlying the left frontal lobe anteriorly and superiorly as detailed above.  No significant change otherwise seen.   .   .   .   s/d/g      A Critical Red actionable finding has been sent via the [employer] Actionable Findings application on 07/15/2024 8:26 AM, Message ID [id number]. Receipt of this communication will be communicated to [facility] RADIOLOGY STAFF or responsible provider and will be documented in [employer] Actionable Findings System upon receiving the acknowledgement.      A Critical Red actionable finding has been sent via the [employer] Actionable Findings application on 07/15/2024 8:26 AM, Message ID [id number]. Receipt of this communication will be communicated to [facility] RADIOLOGY STAFF or responsible provider and will be documented in [employer] Actionable Findings System upon receiving the acknowledgement.         Radiologist location ID: [id number]         CT ABDOMEN PELVIS WO IV CONTRAST   Final Result      1. Grossly stable hematoma in the gallbladder fossa with slight decrease in volume of hemoperitoneum.   2. Additional stable findings as  above.            Radiologist location ID: [id number]         CT ABDOMEN PELVIS WO IV CONTRAST   Final Result      1. Hematoma associated with the gallbladder fossa with fluid tracking inferiorly.       2. Moderate volume hemoperitoneum, more so blood lying in the dependent pelvis, though also around the liver and spleen and pericolic gutters.      3. Other findings as described above.         Radiologist location ID: [id number]         MRI MRCP WO CONTRAST   Final Result      * Mild extra hepatic bile duct dilatation as noted on CT 07/08/2024, no bile duct stone.      * No gallstones, there is pericholecystic fluid as well as fluid adjacent to the liver.      * Diffuse heterogeneous appearance to the liver as described above, this could certainly represent findings associated with acute hepatitis, differential considerations include regenerative nodularity in chronic liver disease, metastatic disease/lymphoma, and possibly sarcoid               Radiologist location ID: [id number]            Radiologist location ID: [id number]         US  RT UPPER QUADRANT   Final Result   Gallbladder wall thickening with pericholecystic fluid consistent with acute cholecystitis.      Dilated common bile duct without distal obstructing lesion identified.      Hepatomegaly with hepatic steatosis.      Trace ascites.               Radiologist location ID: [id number]         CT ANGIO CHEST FOR PULMONARY EMBOLUS W IV CONTRAST   Final Result      1. No evidence of pulmonary embolism.   2. Marginal increase in the size of the right infrahilar mass, right hilar and mediastinal lymphadenopathy compared to the prior study.   3. Other chronic findings as described above.            Radiologist location ID: [id number]         CT ABDOMEN PELVIS W IV CONTRAST   Final Result      1. Hepatomegaly, hepatic steatosis and hepatic lesions, not greatly changed and  new pericholecystic fluid now present.   2. Multiple nonobstructing bilateral  renal calculi.   3. New free fluid in the pelvis.         Radiologist location ID: [id number]         XR AP MOBILE CHEST   Final Result      1. Right hilar adenopathy.      2. Vague pulmonary opacities could be mild pneumonia or atelectasis.            Radiologist location ID: [id number]            Immunization History   Administered Date(s) Administered    Covid-19 Vaccine,Moderna Bivalent 11/24/2021    Covid-19 Vaccine,Moderna,12 Years+ 01/18/2020, 02/22/2020, 10/31/2020       DNR Status:  SELECTED INTERVENTIONS      ASSESSMENT:    Active Hospital Problems    Diagnosis    Primary Problem: Acute hypoxic respiratory failure (CMS HCC)    Moderate protein-calorie malnutrition (CMS HCC)    Elevated LFTs    Hemoperitoneum    Bile leak    Subdural hematoma (CMS HCC)    NSVT (nonsustained ventricular tachycardia) (CMS HCC)    Heart failure, unspecified HF chronicity, unspecified heart failure type (CMS HCC)    Acute cholecystitis    Thickening of wall of gallbladder with pericholecystic fluid    Symptomatic anemia    COVID-19    Acute blood loss anemia    Transaminitis    Malignant neoplasm of lung, unspecified laterality, unspecified part of lung (CMS HCC)    Pulmonary HTN (CMS HCC)    Pneumonia due to COVID-19 virus    Mood disorder (CMS HCC)    Anemia, unspecified type    Cigarette smoker    Metastatic cancer to liver    History of breast cancer    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  Medications, labs, images reviewed.    Continue ventilator support.  Check ABG and adjust ventilator accordingly.    Vent bundle, chlorhexidine, Protonix , SCDs.  Titrate pressors - goal MAP >65 and SBP <150 with good urine output.    Continue amiodarone  drip.  Continue to hold all anticoagulation.    Monitor CBC/coags and transfuse PRBC/platelets/FFP  as needed.    Continue empiric antibiotics.  No pathogens isolated.  Continue stress dose steroids IV-->PO when more stable.    Accu-Cheks with SSI.  Scheduled Lasix  - monitor creatinine, urine output, electrolytes and replace as needed.  Follow ammonia levels and LFTs.  Start tube feeds if unable to extubate soon.  Neurosurgery following.    Neurology following.  Oncology following.  General surgery following.  Echo shows EF 55-60%.  Previous echo showed EF 55-60% with grade 1 diastolic dysfunction.  CT head images reviewed and compared to prior available studies.  Stable small left frontal lobe subdural hematoma.  CT chest abdomen and pelvis images reviewed and compared to prior available studies.  Large volume hemoperitoneum increased from prior studies.  GGO infiltrates in the lungs.  Stable right infrahilar mass.    I discussed case with surgery service at bedside.  Would be very high-risk for surgery.  Likely due to oozing from cholecystectomy site.    Continue to replace blood products and correct  coagulopathy.  We will have goals of care discussion with family.    [name] medically complex with increased risk for sudden decompensation and requires close monitoring of vitals, labs, and/or imaging.      [name] critically ill with acute organ system failure and has high risk of sudden decompensation resulting in morbidity/mortality without ICU level of care. Prognosis is guarded.  CCT performed by me personally: 80 min. CCT time does not include billable procedures. CCT time does include review of pertinent information database, available radiographic images, discussion with other consulting physicians, and formulating a comprehensive care plan that was discussed with care team.    Manus Free, DO   07/18/2024  09:07  Pulmonary and Critical Care Medicine            [1] No Known Allergies

## 2024-07-18 NOTE — Care Plan (Signed)
 [facility] Nutrition Therapy Follow Up       Reason for Assessment: Follow up    SUBJECTIVE :   [name] sedated and on vent.  No nutrition at this time.  Nursing at bedside; no visitors at time of visit.  Chart reviewed.     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, PNA, anemia    Height: 154.9 cm (5' 1)  Admit Weight: 62.1 kg (137 lb)  Weight: 74.9 kg (165 lb 2 oz)  BMI (Calculated): 31.27  Wt up 8.3kg-needs adjusted    Diet currently ordered: reg/mech soft(ground meats) with ensure daily-on vent  NGT  ET tube  BM 10/7  No pressure areas noted    Labs: Lab Results Today:    Results for orders placed or performed during the hospital encounter of 07/08/24 (from the past 24 hours)   POC BLOOD GLUCOSE (RESULTS)   Result Value Ref Range    GLUCOSE, POC 176 (H) 70 - 100 mg/dl   PRODUCT: CRYOPRECIPITATE - UNITS   Result Value Ref Range    Coding System [id number]     UNIT NUMBER [id number]     BLOOD COMPONENT TYPE Thawed Pooled Cryoprecipitate (x5)     UNIT DIVISION 00     UNIT DISPENSE STATUS ISSUED,FINAL     TRANSFUSION STATUS OK TO TRANSFUSE     Product Code E3591V00     Coding System ISBT128     UNIT NUMBER [id number]     BLOOD COMPONENT TYPE Thawed Pooled Cryoprecipitate (x5)     UNIT DIVISION 00     UNIT DISPENSE STATUS ISSUED,FINAL     TRANSFUSION STATUS OK TO TRANSFUSE     Product Code E3591V00    CBC   Result Value Ref Range    WBC 5.5 3.7 - 11.0 x10^3/uL    RBC 3.16 (L) 3.85 - 5.22 x10^6/uL    HGB 9.2 (L) 11.5 - 16.0 g/dL    HCT 73.1 (L) 65.1 - 46.0 %    MCV 84.8 78.0 - 100.0 fL    MCH 29.1 26.0 - 32.0 pg    MCHC 34.3 31.0 - 35.5 g/dL    RDW-CV 85.0 88.4 - 84.4 %    PLATELETS 49 (L) 150 - 400 x10^3/uL    MPV 10.1 8.7 - 12.5 fL   COMPREHENSIVE METABOLIC PANEL, NON-FASTING   Result Value Ref Range    SODIUM 149 (H) 133 - 144 mmol/L    POTASSIUM 3.7 3.2 - 5.0 mmol/L    CHLORIDE 103 96 - 106 mmol/L    CO2 TOTAL 26 22 - 30 mmol/L    ANION  GAP 20 (H) 7 - 18 mmol/L    BUN 56 (H) 8 - 23 mg/dL    CREATININE 8.53 (H) 0.50 - 0.90 mg/dL    ESTIMATED GFR 39 (L) >90 mL/min/1.64m^2    ALBUMIN 3.0 (L) 3.5 - 5.2 g/dL    CALCIUM 9.4 8.3 - 89.2 mg/dL    GLUCOSE 849 (H) 74 - 109 mg/dL    ALKALINE PHOSPHATASE 297 (H) 35 - 129 U/L    ALT (SGPT) 1,038 (H) 0 - 33 U/L    AST (SGOT) 2,763 (H) 0 - 32 U/L    BILIRUBIN TOTAL 5.7 (H) 0.2 - 1.2 mg/dL    PROTEIN TOTAL 5.2 (L) 6.4 - 8.3 g/dL   IONIZED CALCIUM WITH PH   Result Value Ref Range  PH (VENOUS) 7.44 (H) 7.32 - 7.43    IONIZED CALCIUM 1.20 1.15 - 1.33 mmol/L   MAGNESIUM    Result Value Ref Range    MAGNESIUM  1.8 1.6 - 2.4 mg/dL   PHOSPHORUS   Result Value Ref Range    PHOSPHORUS 4.7 (H) 2.5 - 4.5 mg/dL   [name]/INR   Result Value Ref Range    PROTHROMBIN TIME 21.4 (H) 12.1 - 15.3 seconds    INR 1.84 (H) 0.86 - 1.14   PTT (PARTIAL THROMBOPLASTIN TIME)   Result Value Ref Range    APTT 41.8 (H) 22.4 - 34.8 seconds   RESPIRATORY CULTURE AND GRAM STAIN (PERFORMABLE)    Specimen: Sputum   Result Value Ref Range    GRAM STAIN 4+ Many WBCs     GRAM STAIN 2+ Few Gram Positive Cocci in Pairs     GRAM STAIN 2+ Few Hyphae     GRAM STAIN 2+ Few Yeast    BLOOD GAS W/ CO-OX, LYTES, LACTATE REFLEX Arterial   Result Value Ref Range    %FIO2 (ARTERIAL) 40 %    PH (ARTERIAL) 7.41 7.35 - 7.45    PCO2 (ARTERIAL) 47 (H) 35 - 45 mm/Hg    PO2 (ARTERIAL) 69 (L) 83 - 108 mm/Hg    BICARBONATE (ARTERIAL) 28.3 (H) 21.0 - 28.0 mmol/L    BASE EXCESS (ARTERIAL) 4.4 (H) 0.0 - 3.0 mmol/L    PAO2/FIO2 RATIO 173     O2 SATURATION (ARTERIAL) 93.7 94.0 - 98.0 %    HEMOGLOBIN 11.1 (L) 12.0 - 18.0 g/dL    HEMATOCRITRT 33 (L) 37 - 50 %    OXYHEMOGLOBIN 94.8 90.0 - 95.0 %    CARBOXYHEMOGLOBIN 1.9 <=3.0 %    MET-HEMOGLOBIN <0.7 <=1.5 %    O2CT 14.8 %    SODIUM 143 136 - 145 mmol/L    WHOLE BLOOD POTASSIUM 3.6 3.5 - 5.1 mmol/L    CHLORIDE 104 98 - 107 mmol/L    IONIZED CALCIUM 1.21 1.15 - 1.33 mmol/L    GLUCOSE 138 (H) 65 - 125 mg/dL    LACTATE 5.0 (HH) <=8.0  mmol/L   POC BLOOD GLUCOSE (RESULTS)   Result Value Ref Range    GLUCOSE, POC 188 (H) 70 - 100 mg/dl   CBC   Result Value Ref Range    WBC 6.2 3.7 - 11.0 x10^3/uL    RBC 3.26 (L) 3.85 - 5.22 x10^6/uL    HGB 9.3 (L) 11.5 - 16.0 g/dL    HCT 72.2 (L) 65.1 - 46.0 %    MCV 85.0 78.0 - 100.0 fL    MCH 28.5 26.0 - 32.0 pg    MCHC 33.6 31.0 - 35.5 g/dL    RDW-CV 84.3 (H) 88.4 - 15.5 %    PLATELETS 45 (L) 150 - 400 x10^3/uL   COMPREHENSIVE METABOLIC PANEL, NON-FASTING   Result Value Ref Range    SODIUM 148 (H) 133 - 144 mmol/L    POTASSIUM 3.5 3.2 - 5.0 mmol/L    CHLORIDE 102 96 - 106 mmol/L    CO2 TOTAL 28 22 - 30 mmol/L    ANION GAP 18 7 - 18 mmol/L    BUN 60 (H) 8 - 23 mg/dL    CREATININE 8.34 (H) 0.50 - 0.90 mg/dL    ESTIMATED GFR 34 (L) >90 mL/min/1.22m^2    ALBUMIN 2.9 (L) 3.5 - 5.2 g/dL    CALCIUM 9.4 8.3 - 89.2 mg/dL    GLUCOSE 847 (H) 74 -  109 mg/dL    ALKALINE PHOSPHATASE 308 (H) 35 - 129 U/L    ALT (SGPT) 1,143 (H) 0 - 33 U/L    AST (SGOT) 3,018 (H) 0 - 32 U/L    BILIRUBIN TOTAL 6.4 (H) 0.2 - 1.2 mg/dL    PROTEIN TOTAL 5.2 (L) 6.4 - 8.3 g/dL   IONIZED CALCIUM WITH PH   Result Value Ref Range    PH (VENOUS) 7.45 (H) 7.32 - 7.43    IONIZED CALCIUM 1.20 1.15 - 1.33 mmol/L   MAGNESIUM    Result Value Ref Range    MAGNESIUM  1.8 1.6 - 2.4 mg/dL   PHOSPHORUS   Result Value Ref Range    PHOSPHORUS 4.1 2.5 - 4.5 mg/dL   [name]/INR   Result Value Ref Range    PROTHROMBIN TIME 22.8 (H) 12.1 - 15.3 seconds    INR 2.00 (H) 0.86 - 1.14   PTT (PARTIAL THROMBOPLASTIN TIME)   Result Value Ref Range    APTT 46.9 (H) 22.4 - 34.8 seconds   BLOOD GAS W/ CO-OX, LYTES, LACTATE REFLEX Arterial   Result Value Ref Range    %FIO2 (ARTERIAL) 50 %    PH (ARTERIAL) 7.45 7.35 - 7.45    PCO2 (ARTERIAL) 42 35 - 45 mm/Hg    PO2 (ARTERIAL) 96 83 - 108 mm/Hg    BICARBONATE (ARTERIAL) 28.6 (H) 21.0 - 28.0 mmol/L    BASE EXCESS (ARTERIAL) 4.7 (H) 0.0 - 3.0 mmol/L    PAO2/FIO2 RATIO 192     O2 SATURATION (ARTERIAL) 97.8 94.0 - 98.0 %    HEMOGLOBIN 9.8 (L)  12.0 - 18.0 g/dL    HEMATOCRITRT 29 (L) 37 - 50 %    OXYHEMOGLOBIN 96.5 (H) 90.0 - 95.0 %    CARBOXYHEMOGLOBIN 1.4 <=3.0 %    MET-HEMOGLOBIN 1.2 <=1.5 %    O2CT 13.4 %    SODIUM 145 136 - 145 mmol/L    WHOLE BLOOD POTASSIUM 3.4 (L) 3.5 - 5.1 mmol/L    CHLORIDE 104 98 - 107 mmol/L    IONIZED CALCIUM 1.20 1.15 - 1.33 mmol/L    GLUCOSE 138 (H) 65 - 125 mg/dL    LACTATE 3.9 (H) <=8.0 mmol/L   PRODUCT: FFP/PLASMA - UNITS : 1 Units   Result Value Ref Range    Coding System [id number]     UNIT NUMBER [id number]     BLOOD COMPONENT TYPE THAWED PLASMA     UNIT DIVISION 00     UNIT DISPENSE STATUS ISSUED     TRANSFUSION STATUS OK TO TRANSFUSE     Product Code [id number]    CBC   Result Value Ref Range    WBC 7.2 3.7 - 11.0 x10^3/uL    RBC 3.11 (L) 3.85 - 5.22 x10^6/uL    HGB 8.7 (L) 11.5 - 16.0 g/dL    HCT 73.7 (L) 65.1 - 46.0 %    MCV 84.2 78.0 - 100.0 fL    MCH 28.0 26.0 - 32.0 pg    MCHC 33.2 31.0 - 35.5 g/dL    RDW-CV 84.3 (H) 88.4 - 15.5 %    PLATELETS 50 (L) 150 - 400 x10^3/uL   COMPREHENSIVE METABOLIC PANEL, NON-FASTING   Result Value Ref Range    SODIUM 150 (H) 133 - 144 mmol/L    POTASSIUM 3.6 3.2 - 5.0 mmol/L    CHLORIDE 103 96 - 106 mmol/L    CO2 TOTAL 26 22 - 30 mmol/L    ANION GAP 21 (H) 7 -  18 mmol/L    BUN 61 (H) 8 - 23 mg/dL    CREATININE 8.38 (H) 0.50 - 0.90 mg/dL    ESTIMATED GFR 35 (L) >90 mL/min/1.95m^2    ALBUMIN 2.9 (L) 3.5 - 5.2 g/dL    CALCIUM 9.2 8.3 - 89.2 mg/dL    GLUCOSE 857 (H) 74 - 109 mg/dL    ALKALINE PHOSPHATASE 326 (H) 35 - 129 U/L    ALT (SGPT) 1,146 (H) 0 - 33 U/L    AST (SGOT) 2,963 (H) 0 - 32 U/L    BILIRUBIN TOTAL 6.4 (H) 0.2 - 1.2 mg/dL    PROTEIN TOTAL 5.0 (L) 6.4 - 8.3 g/dL   IONIZED CALCIUM WITH PH   Result Value Ref Range    PH (VENOUS) 7.46 (H) 7.32 - 7.43    IONIZED CALCIUM 1.17 1.15 - 1.33 mmol/L   MAGNESIUM    Result Value Ref Range    MAGNESIUM  1.9 1.6 - 2.4 mg/dL   PHOSPHORUS   Result Value Ref Range    PHOSPHORUS 4.1 2.5 - 4.5 mg/dL   [name]/INR   Result Value Ref Range     PROTHROMBIN TIME 22.3 (H) 12.1 - 15.3 seconds    INR 1.94 (H) 0.86 - 1.14   PTT (PARTIAL THROMBOPLASTIN TIME)   Result Value Ref Range    APTT 44.8 (H) 22.4 - 34.8 seconds   LACTIC ACID - FIRST REFLEX   Result Value Ref Range    LACTIC ACID 5.3 (HH) 0.5 - 2.0 mmol/L   BLOOD GAS W/ CO-OX, LYTES, LACTATE REFLEX Arterial   Result Value Ref Range    %FIO2 (ARTERIAL) 40 %    PH (ARTERIAL) 7.44 7.35 - 7.45    PCO2 (ARTERIAL) 42 35 - 45 mm/Hg    PO2 (ARTERIAL) 78 (L) 83 - 108 mm/Hg    BICARBONATE (ARTERIAL) 28.0 21.0 - 28.0 mmol/L    BASE EXCESS (ARTERIAL) 3.9 (H) 0.0 - 3.0 mmol/L    PAO2/FIO2 RATIO 195     O2 SATURATION (ARTERIAL) 95.9 94.0 - 98.0 %    HEMOGLOBIN 9.3 (L) 12.0 - 18.0 g/dL    HEMATOCRITRT 28 (L) 37 - 50 %    OXYHEMOGLOBIN 95.6 (H) 90.0 - 95.0 %    CARBOXYHEMOGLOBIN 1.5 <=3.0 %    MET-HEMOGLOBIN 0.8 <=1.5 %    O2CT 12.6 %    SODIUM 145 136 - 145 mmol/L    WHOLE BLOOD POTASSIUM 3.4 (L) 3.5 - 5.1 mmol/L    CHLORIDE 104 98 - 107 mmol/L    IONIZED CALCIUM 1.19 1.15 - 1.33 mmol/L    GLUCOSE 118 65 - 125 mg/dL    LACTATE 4.7 (HH) <=8.0 mmol/L   POC BLOOD GLUCOSE (RESULTS)   Result Value Ref Range    GLUCOSE, POC 136 (H) 70 - 100 mg/dl   CBC   Result Value Ref Range    WBC 6.2 3.7 - 11.0 x10^3/uL    RBC 2.83 (L) 3.85 - 5.22 x10^6/uL    HGB 8.0 (L) 11.5 - 16.0 g/dL    HCT 75.7 (L) 65.1 - 46.0 %    MCV 85.5 78.0 - 100.0 fL    MCH 28.3 26.0 - 32.0 pg    MCHC 33.1 31.0 - 35.5 g/dL    RDW-CV 84.2 (H) 88.4 - 15.5 %    PLATELETS 36 (L) 150 - 400 x10^3/uL    MPV 11.3 8.7 - 12.5 fL   COMPREHENSIVE METABOLIC PANEL, NON-FASTING   Result Value Ref Range  SODIUM 148 (H) 133 - 144 mmol/L    POTASSIUM 4.1 3.2 - 5.0 mmol/L    CHLORIDE 103 96 - 106 mmol/L    CO2 TOTAL 25 22 - 30 mmol/L    ANION GAP 20 (H) 7 - 18 mmol/L    BUN 61 (H) 8 - 23 mg/dL    CREATININE 8.41 (H) 0.50 - 0.90 mg/dL    ESTIMATED GFR 36 (L) >90 mL/min/1.43m^2    ALBUMIN 3.0 (L) 3.5 - 5.2 g/dL    CALCIUM 9.1 8.3 - 89.2 mg/dL    GLUCOSE 859 (H) 74 - 109 mg/dL     ALKALINE PHOSPHATASE 305 (H) 35 - 129 U/L    ALT (SGPT) 1,130 (H) 0 - 33 U/L    AST (SGOT) 2,911 (H) 0 - 32 U/L    BILIRUBIN TOTAL 6.8 (H) 0.2 - 1.2 mg/dL    PROTEIN TOTAL 5.1 (L) 6.4 - 8.3 g/dL   IONIZED CALCIUM WITH PH   Result Value Ref Range    PH (VENOUS) 7.42 7.32 - 7.43    IONIZED CALCIUM 1.14 (L) 1.15 - 1.33 mmol/L   MAGNESIUM    Result Value Ref Range    MAGNESIUM  2.4 1.6 - 2.4 mg/dL   PHOSPHORUS   Result Value Ref Range    PHOSPHORUS 4.3 2.5 - 4.5 mg/dL   [name]/INR   Result Value Ref Range    PROTHROMBIN TIME 23.8 (H) 12.1 - 15.3 seconds    INR 2.12 (H) 0.86 - 1.14   PTT (PARTIAL THROMBOPLASTIN TIME)   Result Value Ref Range    APTT 47.0 (H) 22.4 - 34.8 seconds   LACTIC ACID - FIRST REFLEX   Result Value Ref Range    LACTIC ACID 5.9 (HH) 0.5 - 2.0 mmol/L   BLOOD GAS W/ CO-OX, LYTES, LACTATE REFLEX Arterial   Result Value Ref Range    %FIO2 (ARTERIAL) 40 %    PH (ARTERIAL) 7.40 7.35 - 7.45    PCO2 (ARTERIAL) 44 35 - 45 mm/Hg    PO2 (ARTERIAL) 81 (L) 83 - 108 mm/Hg    BICARBONATE (ARTERIAL) 26.6 21.0 - 28.0 mmol/L    BASE EXCESS (ARTERIAL) 2.2 0.0 - 3.0 mmol/L    PAO2/FIO2 RATIO 203     O2 SATURATION (ARTERIAL) 95.9 94.0 - 98.0 %    HEMOGLOBIN 8.6 (L) 12.0 - 18.0 g/dL    HEMATOCRITRT 26 (L) 37 - 50 %    OXYHEMOGLOBIN 95.1 (H) 90.0 - 95.0 %    CARBOXYHEMOGLOBIN 1.4 <=3.0 %    MET-HEMOGLOBIN 1.1 <=1.5 %    O2CT 11.6 %    SODIUM 143 136 - 145 mmol/L    WHOLE BLOOD POTASSIUM 4.0 3.5 - 5.1 mmol/L    CHLORIDE 105 98 - 107 mmol/L    IONIZED CALCIUM 1.19 1.15 - 1.33 mmol/L    GLUCOSE 135 (H) 65 - 125 mg/dL    LACTATE 5.9 (HH) <=8.0 mmol/L   POC BLOOD GLUCOSE (RESULTS)   Result Value Ref Range    GLUCOSE, POC 149 (H) 70 - 100 mg/dl   PRODUCT: FFP/PLASMA - UNITS : 3 Units   Result Value Ref Range    Coding System [id number]     UNIT NUMBER [id number]     BLOOD COMPONENT TYPE THAWED PLASMA     UNIT DIVISION 00     UNIT DISPENSE STATUS ISSUED     TRANSFUSION STATUS OK TO TRANSFUSE     Product Code [id number]    VANCOMYCIN,  RANDOM  Result Value Ref Range    VANCOMYCIN RANDOM 8.1 See Comment ug/mL    DOSE DATE 07/18/2024    LACTIC ACID - SECOND REFLEX   Result Value Ref Range    LACTIC ACID 4.7 (HH) 0.5 - 2.0 mmol/L   CBC   Result Value Ref Range    WBC 6.2 3.7 - 11.0 x10^3/uL    RBC 2.63 (L) 3.85 - 5.22 x10^6/uL    HGB 7.5 (L) 11.5 - 16.0 g/dL    HCT 77.1 (L) 65.1 - 46.0 %    MCV 86.7 78.0 - 100.0 fL    MCH 28.5 26.0 - 32.0 pg    MCHC 32.9 31.0 - 35.5 g/dL    RDW-CV 84.0 (H) 88.4 - 15.5 %    PLATELETS 35 (L) 150 - 400 x10^3/uL    MPV 10.1 8.7 - 12.5 fL   COMPREHENSIVE METABOLIC PANEL, NON-FASTING   Result Value Ref Range    SODIUM 149 (H) 133 - 144 mmol/L    POTASSIUM 3.6 3.2 - 5.0 mmol/L    CHLORIDE 101 96 - 106 mmol/L    CO2 TOTAL 27 22 - 30 mmol/L    ANION GAP 21 (H) 7 - 18 mmol/L    BUN 63 (H) 8 - 23 mg/dL    CREATININE 8.39 (H) 0.50 - 0.90 mg/dL    ESTIMATED GFR 35 (L) >90 mL/min/1.5m^2    ALBUMIN 3.1 (L) 3.5 - 5.2 g/dL    CALCIUM 9.1 8.3 - 89.2 mg/dL    GLUCOSE 824 (H) 74 - 109 mg/dL    ALKALINE PHOSPHATASE 294 (H) 35 - 129 U/L    ALT (SGPT) 1,082 (H) 0 - 33 U/L    AST (SGOT) 2,707 (H) 0 - 32 U/L    BILIRUBIN TOTAL 6.6 (H) 0.2 - 1.2 mg/dL    PROTEIN TOTAL 5.3 (L) 6.4 - 8.3 g/dL   IONIZED CALCIUM WITH PH   Result Value Ref Range    PH (VENOUS) 7.38 7.32 - 7.43    IONIZED CALCIUM 1.10 (L) 1.15 - 1.33 mmol/L   MAGNESIUM    Result Value Ref Range    MAGNESIUM  2.2 1.6 - 2.4 mg/dL   PHOSPHORUS   Result Value Ref Range    PHOSPHORUS 4.2 2.5 - 4.5 mg/dL   [name]/INR   Result Value Ref Range    PROTHROMBIN TIME 22.6 (H) 12.1 - 15.3 seconds    INR 1.97 (H) 0.86 - 1.14   PTT (PARTIAL THROMBOPLASTIN TIME)   Result Value Ref Range    APTT 46.5 (H) 22.4 - 34.8 seconds   BLOOD GAS W/ CO-OX, LYTES, LACTATE REFLEX Arterial   Result Value Ref Range    %FIO2 (ARTERIAL) 40 %    PH (ARTERIAL) 7.39 7.35 - 7.45    PCO2 (ARTERIAL) 49 (H) 35 - 45 mm/Hg    PO2 (ARTERIAL) 95 83 - 108 mm/Hg    BICARBONATE (ARTERIAL) 28.2 (H) 21.0 - 28.0 mmol/L    BASE EXCESS  (ARTERIAL) 4.2 (H) 0.0 - 3.0 mmol/L    PAO2/FIO2 RATIO 238     O2 SATURATION (ARTERIAL) 97.3 94.0 - 98.0 %    HEMOGLOBIN 8.0 (L) 12.0 - 18.0 g/dL    HEMATOCRITRT 24 (L) 37 - 50 %    OXYHEMOGLOBIN 97.3 (H) 90.0 - 95.0 %    CARBOXYHEMOGLOBIN 1.4 <=3.0 %    MET-HEMOGLOBIN <0.7 <=1.5 %    O2CT 11.1 %    SODIUM 144 136 - 145 mmol/L    WHOLE BLOOD  POTASSIUM 3.5 3.5 - 5.1 mmol/L    CHLORIDE 104 98 - 107 mmol/L    IONIZED CALCIUM 1.17 1.15 - 1.33 mmol/L    GLUCOSE 165 (H) 65 - 125 mg/dL    LACTATE 4.8 (HH) <=8.0 mmol/L   COMPREHENSIVE METABOLIC PANEL, NON-FASTING   Result Value Ref Range    SODIUM 150 (H) 133 - 144 mmol/L    POTASSIUM 3.5 3.2 - 5.0 mmol/L    CHLORIDE 102 96 - 106 mmol/L    CO2 TOTAL 27 22 - 30 mmol/L    ANION GAP 21 (H) 7 - 18 mmol/L    BUN 64 (H) 8 - 23 mg/dL    CREATININE 8.39 (H) 0.50 - 0.90 mg/dL    ESTIMATED GFR 35 (L) >90 mL/min/1.87m^2    ALBUMIN 3.2 (L) 3.5 - 5.2 g/dL    CALCIUM 9.2 8.3 - 89.2 mg/dL    GLUCOSE 830 (H) 74 - 109 mg/dL    ALKALINE PHOSPHATASE 308 (H) 35 - 129 U/L    ALT (SGPT) 1,053 (H) 0 - 33 U/L    AST (SGOT) 2,598 (H) 0 - 32 U/L    BILIRUBIN TOTAL 6.6 (H) 0.2 - 1.2 mg/dL    PROTEIN TOTAL 5.3 (L) 6.4 - 8.3 g/dL   CBC   Result Value Ref Range    WBC 6.2 3.7 - 11.0 x10^3/uL    RBC 2.45 (L) 3.85 - 5.22 x10^6/uL    HGB 6.9 (LL) 11.5 - 16.0 g/dL    HCT 79.2 (L) 65.1 - 46.0 %    MCV 86.5 78.0 - 100.0 fL    MCH 28.2 26.0 - 32.0 pg    MCHC 32.5 31.0 - 35.5 g/dL    RDW-CV 84.0 (H) 88.4 - 15.5 %    PLATELETS 34 (L) 150 - 400 x10^3/uL    MPV 10.3 8.7 - 12.5 fL   MAGNESIUM    Result Value Ref Range    MAGNESIUM  2.1 1.6 - 2.4 mg/dL   PHOSPHORUS   Result Value Ref Range    PHOSPHORUS 4.3 2.5 - 4.5 mg/dL   IONIZED CALCIUM WITH PH   Result Value Ref Range    PH (VENOUS) 7.33 7.32 - 7.43    IONIZED CALCIUM 1.12 (L) 1.15 - 1.33 mmol/L   [name]/INR   Result Value Ref Range    PROTHROMBIN TIME 22.8 (H) 12.1 - 15.3 seconds    INR 2.00 (H) 0.86 - 1.14   PTT (PARTIAL THROMBOPLASTIN TIME)   Result Value Ref  Range    APTT 45.9 (H) 22.4 - 34.8 seconds   BLOOD GAS W/ CO-OX, LYTES, LACTATE REFLEX Arterial   Result Value Ref Range    %FIO2 (ARTERIAL) 50 %    PH (ARTERIAL) 7.32 (L) 7.35 - 7.45    PCO2 (ARTERIAL) 57 (H) 35 - 45 mm/Hg    PO2 (ARTERIAL) 139 (H) 83 - 108 mm/Hg    BICARBONATE (ARTERIAL) 27.1 21.0 - 28.0 mmol/L    BASE EXCESS (ARTERIAL) 2.7 0.0 - 3.0 mmol/L    PAO2/FIO2 RATIO 278     O2 SATURATION (ARTERIAL) 99.0 94.0 - 98.0 %    HEMOGLOBIN 8.1 (L) 12.0 - 18.0 g/dL    HEMATOCRITRT 24 (L) 37 - 50 %    OXYHEMOGLOBIN 98.0 (H) 90.0 - 95.0 %    CARBOXYHEMOGLOBIN 1.7 <=3.0 %    MET-HEMOGLOBIN <0.7 <=1.5 %    O2CT 11.5 %    SODIUM 144 136 - 145 mmol/L    WHOLE BLOOD POTASSIUM  3.5 3.5 - 5.1 mmol/L    CHLORIDE 106 98 - 107 mmol/L    IONIZED CALCIUM 1.18 1.15 - 1.33 mmol/L    GLUCOSE 176 (H) 65 - 125 mg/dL    LACTATE 5.9 (HH) <=8.0 mmol/L       Meds: albuterol  (PROVENTIL ) 2.5 mg / 3 mL (0.083%) neb solution, 2.5 mg, Nebulization, 4x/day  aluminum-magnesium  hydroxide-simethicone (MAG-AL PLUS) 200-200-20 mg per 5 mL oral liquid, 30 mL, Oral, Q4H PRN  amiodarone  (NEXTERONE ) 360 mg in D5W 200mL premix infusion, 1 mg/min, Intravenous, Continuous  amiodarone  (NEXTERONE ) 360 mg in D5W 200mL premix infusion, 0.5 mg/min, Intravenous, Continuous  amiodarone  in D5W (NEXTERONE ) 150 mg/100 mL (1.5 mg/mL) premix infusion ---Cabinet Override, , ,   anastrozole  (ARIMIDEX ) tablet, 1 mg, Oral, Daily  budesonide (PULMICORT RESPULES) 0.5 mg/2 mL nebulizer suspension, 1 mg, Nebulization, 2x/day   And  arformoterol (BROVANA) 15 mcg/2 mL nebulizer solution, 15 mcg, Nebulization, 2x/day  [Held by provider] atorvastatin  (LIPITOR) tablet, 40 mg, Oral, Daily  chlorhexidine gluconate (PERIDEX) 0.12% mouthwash, 15 mL, Swish & Spit, 2x/day  cholecalciferol  (VITAMIN D3) 1000 unit (25 mcg) tablet, 1,000 Units, Oral, Daily  [Held by provider] clonazePAM  (klonoPIN ) tablet, 0.5 mg, Oral, 2x/day PRN  Correction/SSIP insulin  lispro 100 units/mL injection, 1-5  Units, Subcutaneous, Q4HRS  NS 250 mL flush bag, , Intravenous, Q15 Min PRN   And  D5W 250 mL flush bag, , Intravenous, Q15 Min PRN  dexmedeTOMIDine  (PRECEDEX ) 400 mcg in D5W 100 mL premix infusion, 0-1.5 mcg/kg/hr (Adjusted), Intravenous, Continuous  dextrose  (GLUTOSE) 40% oral gel, 15 g, Oral, Q15 Min PRN  dextrose  50% (0.5 g/mL) injection - syringe, 12.5 g, Intravenous, Q15 Min PRN  DULoxetine  (CYMBALTA ) delayed release capsule, 60 mg, Oral, Daily  EPINEPHrine  (ADRENALIN ) 10 mg in NS 250 mL infusion, 0-0.3 mcg/kg/min, Intravenous, Continuous  fentaNYL (SUBLIMAZE) 50 mcg/mL injection, 100 mcg, Intravenous, Once  fentaNYL (SUBLIMAZE) 50 mcg/mL injection, 50 mcg, Intravenous, Q15 Min PRN  fentaNYL (SUBLIMAZE) 50 mcg/mL IV infusion, 0-3 mcg/kg/hr (Adjusted), Intravenous, Continuous  folic acid  (FOLVITE ) tablet, 1 mg, Oral, Daily  furosemide  (LASIX ) 10 mg/mL injection, 40 mg, Intravenous, 2x/day  glucagon  (GLUCAGEN) injection 1 mg, 1 mg, IntraMUSCULAR, Once PRN  hydrocortisone (solu-CORTEF) 50 mg/mL injection, 50 mg, Intravenous, Q6H  [Held by provider] HYDROmorphone  (DILAUDID ) 0.5 mg/0.5 mL injection, 0.5 mg, Intravenous, Q4H PRN  [Held by provider] losartan  (COZAAR ) tablet, 25 mg, Oral, Daily  [Held by provider] metoprolol  tartrate (LOPRESSOR ) tablet, 25 mg, Oral, 2x/day  midazolam (VERSED) 1 mg/mL injection, 2 mg, Intravenous, Q1H PRN  multivitamin-minerals-iron  oral liquid, 15 mL, Oral, Daily  norepinephrine  (LEVOPHED ) 4 mg in D5W 250 mL premix infusion (16 mcg/mL), 0-0.3 mcg/kg/min, Intravenous, Continuous  NS flush syringe, 3 mL, Intracatheter, Q8HRS  NS flush syringe, 3 mL, Intracatheter, Q1H PRN  [Held by provider] OLANZapine  (zyPREXA ) tablet, 5 mg, Oral, NIGHTLY  omega-3 fatty acids (LOVAZA ) capsule, 1 g, Oral, Daily  pantoprazole  (PROTONIX ) 4 mg/mL injection, 40 mg, Intravenous, Q12H  phenylephrine  (NEO-SYNEPHRINE) 100 mg in NS 250 mL infusion, 0-5 mcg/kg/min, Intravenous, Continuous  piperacillin-tazobactam  (ZOSYN) 4.5 g in iso-osmotic 100 mL premix IVPB, 4.5 g, Intravenous, Q8H  polyethylene glycol (MIRALAX) oral packet, 17 g, Oral, Daily  Vancomycin IV - Pharmacist to Dose per Protocol, , Does not apply, Daily PRN  Vancomycin IV Intermittent Dosing, , Does not apply, Daily PRN  vasopressin (VASOSTRICT) 20 Units in NS 100 mL (0.2 units/mL) infusion, 0.03 Units/min, Intravenous, Continuous         07/18/24 1143  Reason for Assessment   Reason For Assessment follow up   Nutrition/Diet History   Typical Intake (Food/Fluid/EN/PN) [name] on vent   Nutrition Interventions   Nutrition Support Management other (see comments)   RD Height and Weight Calculation Rows   Height Used for Calculations 1.549 m (5' 0.98)   Weight Used For Calculations 74.9 kg (165 lb 2 oz)   Ideal Body Weight (IBW)   Ideal Body Weight (IBW) (kg) 48.11   % Ideal Body Weight 155.68   Adjusted/Standard Body Weight   Adjusted BW 55kg   Body Mass Index (BMI)   BMI (kg/m2) 31.28   BMI Assessment BMI 30-34.9: obesity grade I   Labs/Procedures/Meds   Lab Results Reviewed reviewed, pertinent   Medications   Pertinent Medications Reviewed reviewed, pertinent   Estimated Calorie Needs   Energy Calorie Requirements [id number]   Estimated Calorie Need Method kcal/kg  (20-25kcal/kg adj wt 55kg)   Estimated/Assessed Needs   Protein Requirements (gms/day) 44-55gm  (0.8-1gm/kg adj wt)   Nutrition Risk   Level of Risk high   PES Statement   Problem Inability to swallow   Pathophysiological/Situational Patient on vent   Signs/Symptoms Other (Comment)  (NPO status)   Status New   Nutrition Intervention   Nutrition Intervention 2-->Assessment/reassessment   Nutrition Treatment Plan Monitor weight trends   RD/NDTR Comments f/u 10/13   Work Units 2     Comments: [name] on vent.  Diet currently ordered-suggest NPO order.  If to begin tube feeds suggest: Jevity 1.2 at 11ml/hr with a goal rate of 47ml/hr=1152kcal and 52gm pro.  Suggest 100 ml water  flush q 4hr=1328ml total free  water . Monitor wts, labs and enteral status. Will follow.      PLAN / INTERVENTION        Goals:  Meet estimated needs for Nutrients  Tolerate diet/ nutrition support    Monitor / Evaluate: 1. Nutritional status 2. Weights 3. labs    Recommend :   NPO-tube feed recs provided if needed  Monitor wts, labs and nutritional status         Saddie Blow, RD 07/18/2024, 11:43    Ext [phone]                Problem: Malnutrition  Goal: Improved Nutritional Intake  Intervention: Optimize Nutrition Delivery  Recent Flowsheet Documentation  Taken 07/18/2024 1143 by Saddie HERO, RD  Nutrition Support Management: other (see comments)     Problem: Mechanical Ventilation Invasive  Goal: Optimal Nutrition Delivery  Intervention: Optimize Nutrition Delivery  Flowsheets (Taken 07/18/2024 1143)  Nutrition Support Management: other (see comments)  Note: Enteral recs

## 2024-07-18 NOTE — Consults (Signed)
 [facility] Medicine [facility]  Hematology/Oncology    Progress Note      [name], [name], 68 y.o. 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  S/p fall which warranted CT scan of head, revealing subdural hemorrhage overlying left frontal lobe anteriorly and superiorly.  Noted mentation status change as well.  Neurosurgery and Neurology have been consulted.  Has received multiple PRBC transfusions since being admitted to the hospital.  Hemoglobin 6.9 currently.  Platelets continue to decrease.  INR continues to stay elevated.  Has continued to receive FFP while in the ICU.  Noted now with atrial fib with RVR.  She remains intubated and sedated at this time.    Review of Systems:   Review of Systems   Unable to perform ROS: Intubated     Past Medical History:   Diagnosis Date    Abdominal pain     Cancer (CMS HCC)     Chest pain     Chronic lung disease     Coronary artery disease     Dyslipidemia     Essential hypertension     Heart disease     History of percutaneous coronary intervention     History of stress test     Mixed hyperlipidemia     Unspecified chronic bronchitis      Past Surgical History:   Procedure Laterality Date    CARDIAC CATHETERIZATION      CORONARY ANGIOPLASTY      CORONARY ARTERY STENT PLACEMENT      HX BACK SURGERY      HX CORONARY ARTERY BYPASS GRAFT      HX MASTECTOMY, SIMPLE Bilateral     HX TUBAL LIGATION      NECK SURGERY       Social History     Socioeconomic History    Marital status: Widowed     Spouse name: Not on file    Number of children: Not on file    Years of education: Not on file    Highest education level: Not on file   Occupational History    Not on file   Tobacco Use    Smoking status: Every Day     Current packs/day: 0.50     Average packs/day: 0.5 packs/day for 52.8 years  (26.4 ttl pk-yrs)     Types: Cigarettes     Start date: 38    Smokeless tobacco: Never   Vaping Use    Vaping status: Never Used   Substance and Sexual Activity    Alcohol use: Yes     Comment: occasional    Drug use: Yes     Types: Marijuana    Sexual activity: Not on file   Other Topics Concern    Not on file   Social History Narrative    Not on file     Social Determinants of Health     Financial Resource Strain: Not on file   Transportation Needs: Not on file   Social Connections: Low Risk (07/09/2024)    Social Connections     SDOH Social Isolation: 5 or more times a week   Intimate Partner Violence: Not on file   Housing Stability: Not on file  Family Medical History:       Problem Relation (Age of Onset)    Arthritis Mother, Maternal Grandmother    Cancer Father, Other    Heart Attack Father, Other    Heart Disease Brother, Other    Hypertension (High Blood Pressure) Mother, Father, Other    Pacemaker Mother          Vital Signs:  Temp  Avg: 37.3 C (99.2 F)  Min: 36.4 C (97.5 F)  Max: 38.3 C (100.9 F)    Pulse  Avg: 80.9  Min: 74  Max: 132 BP  Min: 127/53  Max: 127/53   Resp  Avg: 16.9  Min: 8  Max: 25 SpO2  Avg: 94.8 %  Min: 88 %  Max: 100 %          Input/Output    Intake/Output Summary (Last 24 hours) at 07/18/2024 1323  Last data filed at 07/18/2024 1302  Gross per 24 hour   Intake 5203.8 ml   Output 1785 ml   Net 3418.8 ml    I/O last shift:  10/09 0700 - 10/09 1859  In: 1741.89 [I.V.:407.53; Blood:841.86]  Out: 355 [Urine:355]     LABS:   Results for orders placed or performed during the hospital encounter of 07/08/24 (from the past 24 hours)   PRODUCT: CRYOPRECIPITATE - UNITS   Result Value Ref Range    Coding System [id number]     UNIT NUMBER [id number]     BLOOD COMPONENT TYPE Thawed Pooled Cryoprecipitate (x5)     UNIT DIVISION 00     UNIT DISPENSE STATUS ISSUED,FINAL     TRANSFUSION STATUS OK TO TRANSFUSE     Product Code E3591V00     Coding System ISBT128     UNIT NUMBER [id number]      BLOOD COMPONENT TYPE Thawed Pooled Cryoprecipitate (x5)     UNIT DIVISION 00     UNIT DISPENSE STATUS ISSUED,FINAL     TRANSFUSION STATUS OK TO TRANSFUSE     Product Code E3591V00    CBC   Result Value Ref Range    WBC 5.5 3.7 - 11.0 x10^3/uL    RBC 3.16 (L) 3.85 - 5.22 x10^6/uL    HGB 9.2 (L) 11.5 - 16.0 g/dL    HCT 73.1 (L) 65.1 - 46.0 %    MCV 84.8 78.0 - 100.0 fL    MCH 29.1 26.0 - 32.0 pg    MCHC 34.3 31.0 - 35.5 g/dL    RDW-CV 85.0 88.4 - 84.4 %    PLATELETS 49 (L) 150 - 400 x10^3/uL    MPV 10.1 8.7 - 12.5 fL   COMPREHENSIVE METABOLIC PANEL, NON-FASTING   Result Value Ref Range    SODIUM 149 (H) 133 - 144 mmol/L    POTASSIUM 3.7 3.2 - 5.0 mmol/L    CHLORIDE 103 96 - 106 mmol/L    CO2 TOTAL 26 22 - 30 mmol/L    ANION GAP 20 (H) 7 - 18 mmol/L    BUN 56 (H) 8 - 23 mg/dL    CREATININE 8.53 (H) 0.50 - 0.90 mg/dL    ESTIMATED GFR 39 (L) >90 mL/min/1.79m^2    ALBUMIN 3.0 (L) 3.5 - 5.2 g/dL    CALCIUM 9.4 8.3 - 89.2 mg/dL    GLUCOSE 849 (H) 74 - 109 mg/dL    ALKALINE PHOSPHATASE 297 (H) 35 - 129 U/L    ALT (SGPT) 1,038 (H) 0 - 33 U/L    AST (SGOT) 2,763 (  H) 0 - 32 U/L    BILIRUBIN TOTAL 5.7 (H) 0.2 - 1.2 mg/dL    PROTEIN TOTAL 5.2 (L) 6.4 - 8.3 g/dL   IONIZED CALCIUM WITH PH   Result Value Ref Range    PH (VENOUS) 7.44 (H) 7.32 - 7.43    IONIZED CALCIUM 1.20 1.15 - 1.33 mmol/L   MAGNESIUM    Result Value Ref Range    MAGNESIUM  1.8 1.6 - 2.4 mg/dL   PHOSPHORUS   Result Value Ref Range    PHOSPHORUS 4.7 (H) 2.5 - 4.5 mg/dL   PT/INR   Result Value Ref Range    PROTHROMBIN TIME 21.4 (H) 12.1 - 15.3 seconds    INR 1.84 (H) 0.86 - 1.14   PTT (PARTIAL THROMBOPLASTIN TIME)   Result Value Ref Range    APTT 41.8 (H) 22.4 - 34.8 seconds   RESPIRATORY CULTURE AND GRAM STAIN (PERFORMABLE)    Specimen: Sputum   Result Value Ref Range    GRAM STAIN 4+ Many WBCs     GRAM STAIN 2+ Few Gram Positive Cocci in Pairs     GRAM STAIN 2+ Few Hyphae     GRAM STAIN 2+ Few Yeast    BLOOD GAS W/ CO-OX, LYTES, LACTATE REFLEX Arterial   Result  Value Ref Range    %FIO2 (ARTERIAL) 40 %    PH (ARTERIAL) 7.41 7.35 - 7.45    PCO2 (ARTERIAL) 47 (H) 35 - 45 mm/Hg    PO2 (ARTERIAL) 69 (L) 83 - 108 mm/Hg    BICARBONATE (ARTERIAL) 28.3 (H) 21.0 - 28.0 mmol/L    BASE EXCESS (ARTERIAL) 4.4 (H) 0.0 - 3.0 mmol/L    PAO2/FIO2 RATIO 173     O2 SATURATION (ARTERIAL) 93.7 94.0 - 98.0 %    HEMOGLOBIN 11.1 (L) 12.0 - 18.0 g/dL    HEMATOCRITRT 33 (L) 37 - 50 %    OXYHEMOGLOBIN 94.8 90.0 - 95.0 %    CARBOXYHEMOGLOBIN 1.9 <=3.0 %    MET-HEMOGLOBIN <0.7 <=1.5 %    O2CT 14.8 %    SODIUM 143 136 - 145 mmol/L    WHOLE BLOOD POTASSIUM 3.6 3.5 - 5.1 mmol/L    CHLORIDE 104 98 - 107 mmol/L    IONIZED CALCIUM 1.21 1.15 - 1.33 mmol/L    GLUCOSE 138 (H) 65 - 125 mg/dL    LACTATE 5.0 (HH) <=8.0 mmol/L   POC BLOOD GLUCOSE (RESULTS)   Result Value Ref Range    GLUCOSE, POC 188 (H) 70 - 100 mg/dl   CBC   Result Value Ref Range    WBC 6.2 3.7 - 11.0 x10^3/uL    RBC 3.26 (L) 3.85 - 5.22 x10^6/uL    HGB 9.3 (L) 11.5 - 16.0 g/dL    HCT 72.2 (L) 65.1 - 46.0 %    MCV 85.0 78.0 - 100.0 fL    MCH 28.5 26.0 - 32.0 pg    MCHC 33.6 31.0 - 35.5 g/dL    RDW-CV 84.3 (H) 88.4 - 15.5 %    PLATELETS 45 (L) 150 - 400 x10^3/uL   COMPREHENSIVE METABOLIC PANEL, NON-FASTING   Result Value Ref Range    SODIUM 148 (H) 133 - 144 mmol/L    POTASSIUM 3.5 3.2 - 5.0 mmol/L    CHLORIDE 102 96 - 106 mmol/L    CO2 TOTAL 28 22 - 30 mmol/L    ANION GAP 18 7 - 18 mmol/L    BUN 60 (H) 8 - 23 mg/dL    CREATININE  1.65 (H) 0.50 - 0.90 mg/dL    ESTIMATED GFR 34 (L) >90 mL/min/1.63m^2    ALBUMIN 2.9 (L) 3.5 - 5.2 g/dL    CALCIUM 9.4 8.3 - 89.2 mg/dL    GLUCOSE 847 (H) 74 - 109 mg/dL    ALKALINE PHOSPHATASE 308 (H) 35 - 129 U/L    ALT (SGPT) 1,143 (H) 0 - 33 U/L    AST (SGOT) 3,018 (H) 0 - 32 U/L    BILIRUBIN TOTAL 6.4 (H) 0.2 - 1.2 mg/dL    PROTEIN TOTAL 5.2 (L) 6.4 - 8.3 g/dL   IONIZED CALCIUM WITH PH   Result Value Ref Range    PH (VENOUS) 7.45 (H) 7.32 - 7.43    IONIZED CALCIUM 1.20 1.15 - 1.33 mmol/L   MAGNESIUM    Result Value  Ref Range    MAGNESIUM  1.8 1.6 - 2.4 mg/dL   PHOSPHORUS   Result Value Ref Range    PHOSPHORUS 4.1 2.5 - 4.5 mg/dL   PT/INR   Result Value Ref Range    PROTHROMBIN TIME 22.8 (H) 12.1 - 15.3 seconds    INR 2.00 (H) 0.86 - 1.14   PTT (PARTIAL THROMBOPLASTIN TIME)   Result Value Ref Range    APTT 46.9 (H) 22.4 - 34.8 seconds   BLOOD GAS W/ CO-OX, LYTES, LACTATE REFLEX Arterial   Result Value Ref Range    %FIO2 (ARTERIAL) 50 %    PH (ARTERIAL) 7.45 7.35 - 7.45    PCO2 (ARTERIAL) 42 35 - 45 mm/Hg    PO2 (ARTERIAL) 96 83 - 108 mm/Hg    BICARBONATE (ARTERIAL) 28.6 (H) 21.0 - 28.0 mmol/L    BASE EXCESS (ARTERIAL) 4.7 (H) 0.0 - 3.0 mmol/L    PAO2/FIO2 RATIO 192     O2 SATURATION (ARTERIAL) 97.8 94.0 - 98.0 %    HEMOGLOBIN 9.8 (L) 12.0 - 18.0 g/dL    HEMATOCRITRT 29 (L) 37 - 50 %    OXYHEMOGLOBIN 96.5 (H) 90.0 - 95.0 %    CARBOXYHEMOGLOBIN 1.4 <=3.0 %    MET-HEMOGLOBIN 1.2 <=1.5 %    O2CT 13.4 %    SODIUM 145 136 - 145 mmol/L    WHOLE BLOOD POTASSIUM 3.4 (L) 3.5 - 5.1 mmol/L    CHLORIDE 104 98 - 107 mmol/L    IONIZED CALCIUM 1.20 1.15 - 1.33 mmol/L    GLUCOSE 138 (H) 65 - 125 mg/dL    LACTATE 3.9 (H) <=8.0 mmol/L   PRODUCT: FFP/PLASMA - UNITS : 1 Units   Result Value Ref Range    Coding System [id number]     UNIT NUMBER [id number]     BLOOD COMPONENT TYPE THAWED PLASMA     UNIT DIVISION 00     UNIT DISPENSE STATUS ISSUED     TRANSFUSION STATUS OK TO TRANSFUSE     Product Code [id number]    CBC   Result Value Ref Range    WBC 7.2 3.7 - 11.0 x10^3/uL    RBC 3.11 (L) 3.85 - 5.22 x10^6/uL    HGB 8.7 (L) 11.5 - 16.0 g/dL    HCT 73.7 (L) 65.1 - 46.0 %    MCV 84.2 78.0 - 100.0 fL    MCH 28.0 26.0 - 32.0 pg    MCHC 33.2 31.0 - 35.5 g/dL    RDW-CV 84.3 (H) 88.4 - 15.5 %    PLATELETS 50 (L) 150 - 400 x10^3/uL   COMPREHENSIVE METABOLIC PANEL, NON-FASTING   Result Value Ref Range    SODIUM  150 (H) 133 - 144 mmol/L    POTASSIUM 3.6 3.2 - 5.0 mmol/L    CHLORIDE 103 96 - 106 mmol/L    CO2 TOTAL 26 22 - 30 mmol/L    ANION GAP 21 (H) 7 - 18  mmol/L    BUN 61 (H) 8 - 23 mg/dL    CREATININE 8.38 (H) 0.50 - 0.90 mg/dL    ESTIMATED GFR 35 (L) >90 mL/min/1.66m^2    ALBUMIN 2.9 (L) 3.5 - 5.2 g/dL    CALCIUM 9.2 8.3 - 89.2 mg/dL    GLUCOSE 857 (H) 74 - 109 mg/dL    ALKALINE PHOSPHATASE 326 (H) 35 - 129 U/L    ALT (SGPT) 1,146 (H) 0 - 33 U/L    AST (SGOT) 2,963 (H) 0 - 32 U/L    BILIRUBIN TOTAL 6.4 (H) 0.2 - 1.2 mg/dL    PROTEIN TOTAL 5.0 (L) 6.4 - 8.3 g/dL   IONIZED CALCIUM WITH PH   Result Value Ref Range    PH (VENOUS) 7.46 (H) 7.32 - 7.43    IONIZED CALCIUM 1.17 1.15 - 1.33 mmol/L   MAGNESIUM    Result Value Ref Range    MAGNESIUM  1.9 1.6 - 2.4 mg/dL   PHOSPHORUS   Result Value Ref Range    PHOSPHORUS 4.1 2.5 - 4.5 mg/dL   PT/INR   Result Value Ref Range    PROTHROMBIN TIME 22.3 (H) 12.1 - 15.3 seconds    INR 1.94 (H) 0.86 - 1.14   PTT (PARTIAL THROMBOPLASTIN TIME)   Result Value Ref Range    APTT 44.8 (H) 22.4 - 34.8 seconds   LACTIC ACID - FIRST REFLEX   Result Value Ref Range    LACTIC ACID 5.3 (HH) 0.5 - 2.0 mmol/L   BLOOD GAS W/ CO-OX, LYTES, LACTATE REFLEX Arterial   Result Value Ref Range    %FIO2 (ARTERIAL) 40 %    PH (ARTERIAL) 7.44 7.35 - 7.45    PCO2 (ARTERIAL) 42 35 - 45 mm/Hg    PO2 (ARTERIAL) 78 (L) 83 - 108 mm/Hg    BICARBONATE (ARTERIAL) 28.0 21.0 - 28.0 mmol/L    BASE EXCESS (ARTERIAL) 3.9 (H) 0.0 - 3.0 mmol/L    PAO2/FIO2 RATIO 195     O2 SATURATION (ARTERIAL) 95.9 94.0 - 98.0 %    HEMOGLOBIN 9.3 (L) 12.0 - 18.0 g/dL    HEMATOCRITRT 28 (L) 37 - 50 %    OXYHEMOGLOBIN 95.6 (H) 90.0 - 95.0 %    CARBOXYHEMOGLOBIN 1.5 <=3.0 %    MET-HEMOGLOBIN 0.8 <=1.5 %    O2CT 12.6 %    SODIUM 145 136 - 145 mmol/L    WHOLE BLOOD POTASSIUM 3.4 (L) 3.5 - 5.1 mmol/L    CHLORIDE 104 98 - 107 mmol/L    IONIZED CALCIUM 1.19 1.15 - 1.33 mmol/L    GLUCOSE 118 65 - 125 mg/dL    LACTATE 4.7 (HH) <=8.0 mmol/L   POC BLOOD GLUCOSE (RESULTS)   Result Value Ref Range    GLUCOSE, POC 136 (H) 70 - 100 mg/dl   CBC   Result Value Ref Range    WBC 6.2 3.7 - 11.0 x10^3/uL    RBC  2.83 (L) 3.85 - 5.22 x10^6/uL    HGB 8.0 (L) 11.5 - 16.0 g/dL    HCT 75.7 (L) 65.1 - 46.0 %    MCV 85.5 78.0 - 100.0 fL    MCH 28.3 26.0 - 32.0 pg    MCHC 33.1 31.0 - 35.5  g/dL    RDW-CV 84.2 (H) 88.4 - 15.5 %    PLATELETS 36 (L) 150 - 400 x10^3/uL    MPV 11.3 8.7 - 12.5 fL   COMPREHENSIVE METABOLIC PANEL, NON-FASTING   Result Value Ref Range    SODIUM 148 (H) 133 - 144 mmol/L    POTASSIUM 4.1 3.2 - 5.0 mmol/L    CHLORIDE 103 96 - 106 mmol/L    CO2 TOTAL 25 22 - 30 mmol/L    ANION GAP 20 (H) 7 - 18 mmol/L    BUN 61 (H) 8 - 23 mg/dL    CREATININE 8.41 (H) 0.50 - 0.90 mg/dL    ESTIMATED GFR 36 (L) >90 mL/min/1.70m^2    ALBUMIN 3.0 (L) 3.5 - 5.2 g/dL    CALCIUM 9.1 8.3 - 89.2 mg/dL    GLUCOSE 859 (H) 74 - 109 mg/dL    ALKALINE PHOSPHATASE 305 (H) 35 - 129 U/L    ALT (SGPT) 1,130 (H) 0 - 33 U/L    AST (SGOT) 2,911 (H) 0 - 32 U/L    BILIRUBIN TOTAL 6.8 (H) 0.2 - 1.2 mg/dL    PROTEIN TOTAL 5.1 (L) 6.4 - 8.3 g/dL   IONIZED CALCIUM WITH PH   Result Value Ref Range    PH (VENOUS) 7.42 7.32 - 7.43    IONIZED CALCIUM 1.14 (L) 1.15 - 1.33 mmol/L   MAGNESIUM    Result Value Ref Range    MAGNESIUM  2.4 1.6 - 2.4 mg/dL   PHOSPHORUS   Result Value Ref Range    PHOSPHORUS 4.3 2.5 - 4.5 mg/dL   PT/INR   Result Value Ref Range    PROTHROMBIN TIME 23.8 (H) 12.1 - 15.3 seconds    INR 2.12 (H) 0.86 - 1.14   PTT (PARTIAL THROMBOPLASTIN TIME)   Result Value Ref Range    APTT 47.0 (H) 22.4 - 34.8 seconds   LACTIC ACID - FIRST REFLEX   Result Value Ref Range    LACTIC ACID 5.9 (HH) 0.5 - 2.0 mmol/L   BLOOD GAS W/ CO-OX, LYTES, LACTATE REFLEX Arterial   Result Value Ref Range    %FIO2 (ARTERIAL) 40 %    PH (ARTERIAL) 7.40 7.35 - 7.45    PCO2 (ARTERIAL) 44 35 - 45 mm/Hg    PO2 (ARTERIAL) 81 (L) 83 - 108 mm/Hg    BICARBONATE (ARTERIAL) 26.6 21.0 - 28.0 mmol/L    BASE EXCESS (ARTERIAL) 2.2 0.0 - 3.0 mmol/L    PAO2/FIO2 RATIO 203     O2 SATURATION (ARTERIAL) 95.9 94.0 - 98.0 %    HEMOGLOBIN 8.6 (L) 12.0 - 18.0 g/dL    HEMATOCRITRT 26 (L) 37 - 50 %     OXYHEMOGLOBIN 95.1 (H) 90.0 - 95.0 %    CARBOXYHEMOGLOBIN 1.4 <=3.0 %    MET-HEMOGLOBIN 1.1 <=1.5 %    O2CT 11.6 %    SODIUM 143 136 - 145 mmol/L    WHOLE BLOOD POTASSIUM 4.0 3.5 - 5.1 mmol/L    CHLORIDE 105 98 - 107 mmol/L    IONIZED CALCIUM 1.19 1.15 - 1.33 mmol/L    GLUCOSE 135 (H) 65 - 125 mg/dL    LACTATE 5.9 (HH) <=8.0 mmol/L   POC BLOOD GLUCOSE (RESULTS)   Result Value Ref Range    GLUCOSE, POC 149 (H) 70 - 100 mg/dl   PRODUCT: FFP/PLASMA - UNITS : 3 Units   Result Value Ref Range    Coding System [id number]     UNIT NUMBER [id number]  BLOOD COMPONENT TYPE THAWED PLASMA     UNIT DIVISION 00     UNIT DISPENSE STATUS ISSUED     TRANSFUSION STATUS OK TO TRANSFUSE     Product Code [id number]    VANCOMYCIN, RANDOM   Result Value Ref Range    VANCOMYCIN RANDOM 8.1 See Comment ug/mL    DOSE DATE 07/18/2024    LACTIC ACID - SECOND REFLEX   Result Value Ref Range    LACTIC ACID 4.7 (HH) 0.5 - 2.0 mmol/L   CBC   Result Value Ref Range    WBC 6.2 3.7 - 11.0 x10^3/uL    RBC 2.63 (L) 3.85 - 5.22 x10^6/uL    HGB 7.5 (L) 11.5 - 16.0 g/dL    HCT 77.1 (L) 65.1 - 46.0 %    MCV 86.7 78.0 - 100.0 fL    MCH 28.5 26.0 - 32.0 pg    MCHC 32.9 31.0 - 35.5 g/dL    RDW-CV 84.0 (H) 88.4 - 15.5 %    PLATELETS 35 (L) 150 - 400 x10^3/uL    MPV 10.1 8.7 - 12.5 fL   COMPREHENSIVE METABOLIC PANEL, NON-FASTING   Result Value Ref Range    SODIUM 149 (H) 133 - 144 mmol/L    POTASSIUM 3.6 3.2 - 5.0 mmol/L    CHLORIDE 101 96 - 106 mmol/L    CO2 TOTAL 27 22 - 30 mmol/L    ANION GAP 21 (H) 7 - 18 mmol/L    BUN 63 (H) 8 - 23 mg/dL    CREATININE 8.39 (H) 0.50 - 0.90 mg/dL    ESTIMATED GFR 35 (L) >90 mL/min/1.25m^2    ALBUMIN 3.1 (L) 3.5 - 5.2 g/dL    CALCIUM 9.1 8.3 - 89.2 mg/dL    GLUCOSE 824 (H) 74 - 109 mg/dL    ALKALINE PHOSPHATASE 294 (H) 35 - 129 U/L    ALT (SGPT) 1,082 (H) 0 - 33 U/L    AST (SGOT) 2,707 (H) 0 - 32 U/L    BILIRUBIN TOTAL 6.6 (H) 0.2 - 1.2 mg/dL    PROTEIN TOTAL 5.3 (L) 6.4 - 8.3 g/dL   IONIZED CALCIUM WITH PH   Result Value  Ref Range    PH (VENOUS) 7.38 7.32 - 7.43    IONIZED CALCIUM 1.10 (L) 1.15 - 1.33 mmol/L   MAGNESIUM    Result Value Ref Range    MAGNESIUM  2.2 1.6 - 2.4 mg/dL   PHOSPHORUS   Result Value Ref Range    PHOSPHORUS 4.2 2.5 - 4.5 mg/dL   PT/INR   Result Value Ref Range    PROTHROMBIN TIME 22.6 (H) 12.1 - 15.3 seconds    INR 1.97 (H) 0.86 - 1.14   PTT (PARTIAL THROMBOPLASTIN TIME)   Result Value Ref Range    APTT 46.5 (H) 22.4 - 34.8 seconds   BLOOD GAS W/ CO-OX, LYTES, LACTATE REFLEX Arterial   Result Value Ref Range    %FIO2 (ARTERIAL) 40 %    PH (ARTERIAL) 7.39 7.35 - 7.45    PCO2 (ARTERIAL) 49 (H) 35 - 45 mm/Hg    PO2 (ARTERIAL) 95 83 - 108 mm/Hg    BICARBONATE (ARTERIAL) 28.2 (H) 21.0 - 28.0 mmol/L    BASE EXCESS (ARTERIAL) 4.2 (H) 0.0 - 3.0 mmol/L    PAO2/FIO2 RATIO 238     O2 SATURATION (ARTERIAL) 97.3 94.0 - 98.0 %    HEMOGLOBIN 8.0 (L) 12.0 - 18.0 g/dL    HEMATOCRITRT 24 (L) 37 - 50 %  OXYHEMOGLOBIN 97.3 (H) 90.0 - 95.0 %    CARBOXYHEMOGLOBIN 1.4 <=3.0 %    MET-HEMOGLOBIN <0.7 <=1.5 %    O2CT 11.1 %    SODIUM 144 136 - 145 mmol/L    WHOLE BLOOD POTASSIUM 3.5 3.5 - 5.1 mmol/L    CHLORIDE 104 98 - 107 mmol/L    IONIZED CALCIUM 1.17 1.15 - 1.33 mmol/L    GLUCOSE 165 (H) 65 - 125 mg/dL    LACTATE 4.8 (HH) <=8.0 mmol/L   COMPREHENSIVE METABOLIC PANEL, NON-FASTING   Result Value Ref Range    SODIUM 150 (H) 133 - 144 mmol/L    POTASSIUM 3.5 3.2 - 5.0 mmol/L    CHLORIDE 102 96 - 106 mmol/L    CO2 TOTAL 27 22 - 30 mmol/L    ANION GAP 21 (H) 7 - 18 mmol/L    BUN 64 (H) 8 - 23 mg/dL    CREATININE 8.39 (H) 0.50 - 0.90 mg/dL    ESTIMATED GFR 35 (L) >90 mL/min/1.70m^2    ALBUMIN 3.2 (L) 3.5 - 5.2 g/dL    CALCIUM 9.2 8.3 - 89.2 mg/dL    GLUCOSE 830 (H) 74 - 109 mg/dL    ALKALINE PHOSPHATASE 308 (H) 35 - 129 U/L    ALT (SGPT) 1,053 (H) 0 - 33 U/L    AST (SGOT) 2,598 (H) 0 - 32 U/L    BILIRUBIN TOTAL 6.6 (H) 0.2 - 1.2 mg/dL    PROTEIN TOTAL 5.3 (L) 6.4 - 8.3 g/dL   CBC   Result Value Ref Range    WBC 6.2 3.7 - 11.0 x10^3/uL     RBC 2.45 (L) 3.85 - 5.22 x10^6/uL    HGB 6.9 (LL) 11.5 - 16.0 g/dL    HCT 79.2 (L) 65.1 - 46.0 %    MCV 86.5 78.0 - 100.0 fL    MCH 28.2 26.0 - 32.0 pg    MCHC 32.5 31.0 - 35.5 g/dL    RDW-CV 84.0 (H) 88.4 - 15.5 %    PLATELETS 34 (L) 150 - 400 x10^3/uL    MPV 10.3 8.7 - 12.5 fL   MAGNESIUM    Result Value Ref Range    MAGNESIUM  2.1 1.6 - 2.4 mg/dL   PHOSPHORUS   Result Value Ref Range    PHOSPHORUS 4.3 2.5 - 4.5 mg/dL   COMPREHENSIVE METABOLIC PANEL, NON-FASTING   Result Value Ref Range    SODIUM 148 (H) 133 - 144 mmol/L    POTASSIUM 3.6 3.2 - 5.0 mmol/L    CHLORIDE 103 96 - 106 mmol/L    CO2 TOTAL 25 22 - 30 mmol/L    ANION GAP 20 (H) 7 - 18 mmol/L    BUN 62 (H) 8 - 23 mg/dL    CREATININE 8.33 (H) 0.50 - 0.90 mg/dL    ESTIMATED GFR 34 (L) >90 mL/min/1.57m^2    ALBUMIN 3.1 (L) 3.5 - 5.2 g/dL    CALCIUM 8.8 8.3 - 89.2 mg/dL    GLUCOSE 816 (H) 74 - 109 mg/dL    ALKALINE PHOSPHATASE 267 (H) 35 - 129 U/L    ALT (SGPT) 1,047 (H) 0 - 33 U/L    AST (SGOT) 2,562 (H) 0 - 32 U/L    BILIRUBIN TOTAL 7.0 (H) 0.2 - 1.2 mg/dL    PROTEIN TOTAL 5.2 (L) 6.4 - 8.3 g/dL   IONIZED CALCIUM WITH PH   Result Value Ref Range    PH (VENOUS) 7.33 7.32 - 7.43    IONIZED CALCIUM 1.12 (  L) 1.15 - 1.33 mmol/L   MAGNESIUM    Result Value Ref Range    MAGNESIUM  2.0 1.6 - 2.4 mg/dL   PHOSPHORUS   Result Value Ref Range    PHOSPHORUS 4.6 (H) 2.5 - 4.5 mg/dL   PT/INR   Result Value Ref Range    PROTHROMBIN TIME 22.8 (H) 12.1 - 15.3 seconds    INR 2.00 (H) 0.86 - 1.14   PTT (PARTIAL THROMBOPLASTIN TIME)   Result Value Ref Range    APTT 45.9 (H) 22.4 - 34.8 seconds   BLOOD GAS W/ CO-OX, LYTES, LACTATE REFLEX Arterial   Result Value Ref Range    %FIO2 (ARTERIAL) 50 %    PH (ARTERIAL) 7.32 (L) 7.35 - 7.45    PCO2 (ARTERIAL) 57 (H) 35 - 45 mm/Hg    PO2 (ARTERIAL) 139 (H) 83 - 108 mm/Hg    BICARBONATE (ARTERIAL) 27.1 21.0 - 28.0 mmol/L    BASE EXCESS (ARTERIAL) 2.7 0.0 - 3.0 mmol/L    PAO2/FIO2 RATIO 278     O2 SATURATION (ARTERIAL) 99.0 94.0 - 98.0 %     HEMOGLOBIN 8.1 (L) 12.0 - 18.0 g/dL    HEMATOCRITRT 24 (L) 37 - 50 %    OXYHEMOGLOBIN 98.0 (H) 90.0 - 95.0 %    CARBOXYHEMOGLOBIN 1.7 <=3.0 %    MET-HEMOGLOBIN <0.7 <=1.5 %    O2CT 11.5 %    SODIUM 144 136 - 145 mmol/L    WHOLE BLOOD POTASSIUM 3.5 3.5 - 5.1 mmol/L    CHLORIDE 106 98 - 107 mmol/L    IONIZED CALCIUM 1.18 1.15 - 1.33 mmol/L    GLUCOSE 176 (H) 65 - 125 mg/dL    LACTATE 5.9 (HH) <=8.0 mmol/L       IMAGES :  Results for orders placed or performed during the hospital encounter of 07/08/24 (from the past 72 hours)   CT BRAIN WO IV CONTRAST     Status: None    Narrative    Kanasia JEAN Bondar    CT BRAIN WO IV CONTRAST performed on 07/16/2024 6:33 AM.    INDICATION:  Subdural hematoma   Additional History:  Subdural hematoma    TECHNIQUE:  Unenhanced CT head with axial, coronal, and sagittal multiplanar reformations. Dose modulation, automated exposure control, and/or iterative reconstruction were used for dose reduction.    COMPARISON: Previous day    FINDINGS:  Small amount of acute blood in the subdural space overlying the left frontal lobe anteriorly and also superiorly is stable in appearance. No significant mass effect or midline shift.    No acute blood products are seen elsewhere. No adverse interval change is seen.              Impression    Stable exam  .  .  .  s/d/g        Radiologist location ID: [id number]     NUC CHOLESCINTIGRAPHY HEPATO IMAGING WO EF     Status: None    Narrative    Jerolyn JEAN Conover    NUC CHOLESCINTIGRAPHY HEPATO SYSTEM WITHOUT PHARM performed on 07/16/2024 6:44 PM.    INDICATION:  Increased bilirubin s/p cholecystectomy   Additional History:  abdominal pain, recent cholecystectomy, elevated liver enzymes    TECHNIQUE:  Routine hepatobiliary scan performed with sequential anterior planar imaging of the abdomen.    RADIOPHARMACEUTICAL:  6.9mCi of Tc99m Mebrofenin IV    * Anterior images extending to 4 hours were completed.  * Activity is demonstrated throughout the  liver which  appears dominant in size, difficult to determine by nuclear medicine study, however enlargement CT 07/13/2024.  * There is marked delay in activity within the biliary system, demonstrated on the 2 and 4 hour delayed images, indicating hepatocellular dysfunction, clinical assessment required.  * 4 hour image reveals subtle increased activity lateral to the lower right hepatic lobe, extending into the right abdomen and pelvis, in the expected region of the paracolic gutter. This is certainly worrisome for bile duct leak.      Impression    * IMPRESSION:    * Study indicating hepatic dysfunction with delay in activity within the biliary system, activity within the expected region of the right paracolic gutter on 4 hour study is worrisome for but not clearly diagnostic of a bile duct leak, ERCP with positive contrast evaluation may certainly be of value      Radiologist location ID: [id number]     XR CHEST AP     Status: None    Narrative    Lumen JEAN Rhude    XR CHEST AP performed on 07/17/2024 1:33 AM.    INDICATION:  hypoxia   Additional History:  central line placement check; hypoxia    TECHNIQUE:  Single frontal view of the chest.  1 views/1 images submitted for interpretation.    COMPARISON:  Chest radiograph 07/08/2024  ___________________________________  FINDINGS:    Right IJ central venous catheter terminates at the lower SVC. Sternal plates and wires appear intact.  Cardiomediastinal silhouette is normal in size. Atherosclerotic calcification of the aortic knob.  Known right hilar adenopathy. Negative for pleural effusion. Biapical predominant coarsened interstitial markings likely reflecting chronic parenchymal changes. Negative for pneumothorax.  ___________________________________    Impression    1. Right IJ central venous catheter in appropriate position.  2. Chronic interstitial changes similar to prior radiograph; superimposed infectious process difficult to exclude.      Radiologist location ID:  [id number]     CT BRAIN WO IV CONTRAST     Status: None    Narrative    Miosotis JEAN Chevalier    CT BRAIN WO IV CONTRAST performed on 07/17/2024 3:13 AM.    INDICATION:  known SDH, AMS   Additional History:  known SDH, AMS, mechanical ventilation    TECHNIQUE:  Unenhanced CT head with axial, coronal, and sagittal multiplanar reformations. Dose modulation, automated exposure control, and/or iterative reconstruction were used for dose reduction.    COMPARISON:  CT 07/16/2024  ___________________________________  FINDINGS:    Small volume extra-axial subdural hemorrhage along the anterior and superior left frontal lobe measuring 4 mm (series 10, image 21) and 3 mm (series 10, image 25) respectively not changed from prior CT. Negative for new or increasing blood products. Negative for midline shift.  The ventricles are within normal limits for size, configuration, and symmetry.     The grey-white differentiation is well preserved throughout, with no evidence of acute infarct.    Brain parenchymal volume is appropriate for age. Mild periventricular patchy hypoattenuation reflecting sequela chronic microvascular ischemic change.    Osseous structures are unremarkable.  Left maxillary and scattered ethmoid air cell mucosal thickening. Remaining paranasal sinuses are clear.  Mastoid air cells are clear.   ___________________________________    Impression    Small-volume left-sided subdural hemorrhage similar to prior CT. Negative for new or increasing blood products.        Radiologist location ID: [id number]     CT CHEST ABDOMEN PELVIS WO IV CONTRAST  Status: Abnormal    Narrative    Christon JEAN Avena    CT CHEST ABDOMEN PELVIS WO IV CONTRAST performed on 07/17/2024 3:15 AM.    INDICATION:  AMS, lethargy, hypotension   Additional History:  AMS, lethargy, hypotension, mechanical ventilation, history of CABG    TECHNIQUE:  CT chest, abdomen, and pelvis with axial, coronal, and sagittal multiplanar reformations, performed  without contrast. Dose modulation, automated exposure control, and/or iterative reconstruction were used for dose reduction.    COMPARISON:  CT 07/08/2024, CT 07/12/2024, CT 07/13/2024.  ___________________________________  FINDINGS:    CT CHEST:    LUNGS / AIRWAYS: Groundglass opacities of the right upper and left upper lobes as well as the lingula which are new from 07/08/2024 CT. Right perihilar mass is poorly defined given contiguity with the mediastinum on this noncontrast examination, however mass appears grossly similar in size to 07/08/2024 CT. Increasing interstitial prominence at the peripheral aspect of the mass (series 3, image 32).    HEART / GREAT VESSELS: Heart is stable in size. Thoracic aorta is unremarkable. Atherosclerotic calcification of the coronary arteries is severe.    MEDIASTINUM: Confluent right hilar and mediastinal lymphadenopathy appears similar to 07/08/2024 CT of the structures otherwise poorly evaluated on noncontrast examination. Negative for axillary lymphadenopathy.    OTHER:  The visualized thyroid gland is unremarkable.  Endotracheal tube terminates above the carina but insinuates toward the right mainstem bronchus (series 6, image 53). Enteric tube terminates in the stomach.    CT ABDOMEN/PELVIS:    HEPATOBILIARY:  Hepatomegaly measuring approximately 21.6 cm in maximum craniocaudal dimension. Mild periportal edema. Negative for apparent focal liver lesion, limited in evaluation on this noncontrast examination.  Hematoma in the gallbladder fossa not changed from 07/13/2024 CT.  Negative for apparent intrahepatic biliary ductal dilation. Extrahepatic bile duct are well-visualized.  PANCREAS: No focal masses or ductal dilatation.  SPLEEN: No splenomegaly.    ADRENALS: No adrenal nodules.  KIDNEYS/URETERS/BLADDER: Negative for hydroureteronephrosis. Multiple nonobstructing calculi bilaterally similar to prior CT. Urinary bladder is decompressed, limited for evaluation.  REPRODUCTIVE:  Negative for acute abnormality.    PERITONEUM / RETROPERITONEUM: Large volume hemoperitoneum which is predominantly low attenuation with layering increased attenuation in the pelvic cul-de-sac increased in volume from 07/13/2024 CT. Negative for pneumoperitoneum.  LYMPH NODES: No lymphadenopathy.  VESSELS: Abdominal aorta is normal in caliber.    GI TRACT: No distention, wall thickening, or inflammatory stranding.  The appendix is not seen, but there are no secondary signs of appendicitis.  ABDOMINAL WALL:  Postsurgical change of the ventral abdominal wall. Few subcutaneous hematomas in the right ventral abdominal wall not changed from prior CT.    SOFT TISSUES: Unremarkable.    CT BONES:  Negative for acute displaced fracture or dislocation.  ___________________________________    Impression    1. Large volume hemoperitoneum, increased from 07/13/2024 CT. Persistent hyperattenuating material in the gallbladder fossa. Correlate with concern for ongoing extravasation.  2. Groundglass opacities in the right greater than left upper lobes new from prior CT suspicious for developing multifocal infectious process.   3. Endotracheal tube terminates above the carina but insinuates towards the right mainstem bronchus; consider slight retraction. Enteric tube terminating in the gastric body.  4. Right infrahilar mass poorly delineated on noncontrast examination, similar in size to 07/08/2024 CT. Increasing adjacent groundglass opacity and nodularity which could represent infectious/inflammatory process versus progression.  5. Additional chronic findings as above.      A Critical Red actionable finding has been  sent via the [employer] Actionable Findings application on 07/17/2024 3:57 AM, Message ID [id number]. Receipt of this communication will be communicated to [facility] RADIOLOGY STAFF or responsible provider and will be documented in [employer] Actionable Findings System upon receiving the acknowledgement.      Radiologist  location ID: [id number]     XR AP MOBILE CHEST     Status: None    Narrative    Regine JEAN Conway    XR AP MOBILE CHEST performed on 07/18/2024 9:10 AM.    INDICATION:  Respiratory failure   Additional History:  Respiratory failure    TECHNIQUE:  Single portable frontal view of the chest.  1 views/1 images submitted for interpretation.    COMPARISON:  Yesterday.    Number of Views: 1 views/1 images.    FINDINGS:  Endotracheal tube tip projects 3.5 cm above carina. Gastric tube tip projects below the GE junction. Temperature probe projects at the level of the midesophagus. Central line is unchanged.  Mediastinal contours are unchanged. Sternotomy with CABG.    Negative for pneumothorax. Increasing consolidation in the right upper lung zones and left lung base.      Impression    Support apparatus. Increasing bilateral consolidation.    .  .  .  s/d/g          Radiologist location ID: [id number]       Physical Exam:  Physical Exam  Vitals reviewed.   Constitutional:       Appearance: Normal appearance. She is normal weight.   HENT:      Head: Normocephalic.      Nose: Nose normal.      Mouth/Throat:      Pharynx: Oropharynx is clear.   Eyes:      Extraocular Movements: Extraocular movements intact.      Conjunctiva/sclera: Conjunctivae normal.   Cardiovascular:      Rate and Rhythm: Normal rate.      Pulses: Normal pulses.   Pulmonary:      Effort: Pulmonary effort is normal.   Abdominal:      Palpations: Abdomen is soft.   Musculoskeletal:         General: Normal range of motion.      Cervical back: Normal range of motion.   Skin:     General: Skin is dry.      Coloration: Skin is jaundiced.   Neurological:      Mental Status: She is alert. She is disoriented.   Psychiatric:         Cognition and Memory: Cognition is impaired. Memory is impaired.       Problem List:  Active Hospital Problems   (*Primary Problem)    Diagnosis    *Acute hypoxic respiratory failure (CMS HCC)    Moderate protein-calorie malnutrition  (CMS HCC)    Elevated LFTs    Hemoperitoneum    Bile leak    Subdural hematoma (CMS HCC)    NSVT (nonsustained ventricular tachycardia) (CMS HCC)    Heart failure, unspecified HF chronicity, unspecified heart failure type (CMS HCC)    Acute cholecystitis    Thickening of wall of gallbladder with pericholecystic fluid    Symptomatic anemia    COVID-19    Acute blood loss anemia    Transaminitis    Malignant neoplasm of lung, unspecified laterality, unspecified part of lung (CMS HCC)    Pulmonary HTN (CMS HCC)    Pneumonia due to COVID-19 virus    Mood disorder (CMS HCC)  Anemia, unspecified type    Cigarette smoker    Metastasis to liver    History of breast cancer     Chronic    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  She has been COVID positive this admission.  CTA chest without evidence of PE.  She is on p.o. antibiotics.    Small-cell lung CA with liver metastasis:  Follows with Medical Oncology on outpatient basis at [facility].  She was to receive carboplatin/etoposide/Tecentriq on 07/08/2024 when she presented with acute respiratory symptoms that prompted further evaluation by the emergency room as stated in the above HPI.  Withhold current chemotherapy regimen until resolution of acute conditions.    Normocytic hypochromic anemia:   Current H/H = 6.9/20.7.  Has received multiple PRBC transfusions since being admitted.  Could be secondary to acute illness or metastatic disease.  Noted hemoperitoneum per recent CT abdomen/pelvis.  Vitamin B12 normal at 1106.  Folate low at 3.5.  On daily folic acid .  Copper normal at 113.  Noted transaminitis as well as  elevated alk-phos.  This is likely related to liver mets.  Continue to monitor counts.  Transfuse with PRBC if hemoglobin less than 7 grams/deciliter or greater than 7 and symptomatic.  Will benefit from blood transfusion.    Thrombocytopenia:  Current counts = 34,000.  She is s/p recent fall, with CT head revealing small subdural hemorrhage and associated mental status change.  Currently in ICU for closer monitoring and management.  Low platelet counts secondary to hepatic dysfunction r/t liver metastasis.  Defer further thrombocytopenia panel at this time.  Continue to monitor counts.  Transfuse with platelets if <50 K and actively bleeding or <10 K w/wo bleeding.  Previously slightly elevated PT/INR.  Normal PTT and fibrinogen.    Continues to receive FFP while in the ICU.  Order for 1 unit today, 07/18/2024.  Continue transfusing FFP if INR remains above 1.5.    Subdural hemorrhage: As above, as evidenced by CT head.  Neurosurgery and neurology following.  Keep platelets greater than 100,000. Transfuse FFPs if INR greater than 1.5.    Cholelithiasis: General surgery is following.  She is s/p cholecystectomy  07/11/2024.  Noted hemoperitoneum per recent CT abdomen/pelvis.  General surgery without further surgical intervention at this time - recommending serial H/H checks.    Hypertension:  Antihypertensives held this morning due to noted hypotension and in light of recent bleed, mentation change.    HLD:  On statin.    CAD:  History of CABG x1.  81 mg ASA held at this time.      Rosina Ice, APRN, CNP      Attending note:  I have independently seen and examined the patient on the same date of service as our mid-level.  I agree with the plan of care as documented by my mid-level.  I have personally reviewed  the pertinent medications, labs  and radiographic studies  as documented in my mid-level note.  The case was discussed in detail with the midlevel provider.  I agree with the above note.  Some changes and  additions were made.  I spent more than 50% of the total encounter time for the care of this patient and documentation.      Norleen Amen, MD

## 2024-07-18 NOTE — Nurses Notes (Signed)
 Restraint Continuation      Patient continues to have the following condition intubated + sedated.       Least restrictive alternatives attempted : Family called, Increase patient observation, bed-exit alarm/motion detector, call light accessible, concealed tubes/lines, decreased/removed stimulus, frequent orientation, involved patient in conversation, reoriented to person, place, time, and treatment, encouraged relaxation techniques, redirected behavior, and assessed meds/medical problems    Patient continues to exhibit the following behaviors: uncooperative    The restraint continued to facilitate medical/surgical treatment to ensure safety.    The patient will continue to be evaluated and assessments documented on the flowsheet to ensure that the patient is released from the restraint at the earliest possible time

## 2024-07-18 NOTE — Care Plan (Signed)
 Problem: Wound  Goal: Optimal Coping  Outcome: Ongoing (see interventions/notes)  Intervention: Support Patient and Family Response  Recent Flowsheet Documentation  Taken 07/18/2024 2300 by Oddis MATSU, GN  Family/Support System Care:   caregiver stress acknowledged   support provided  Taken 07/18/2024 1900 by Oddis MATSU, GN  Family/Support System Care:   caregiver stress acknowledged   support provided  Goal: Optimal Functional Ability  Outcome: Ongoing (see interventions/notes)  Intervention: Optimize Functional Ability  Recent Flowsheet Documentation  Taken 07/18/2024 2300 by Oddis MATSU, GN  Activity Management: bedrest  Activity Assistance Provided: assistance, 2 people  Taken 07/18/2024 1900 by Oddis MATSU, GN  Activity Management: bedrest  Activity Assistance Provided: assistance, 2 people  Goal: Absence of Infection Signs and Symptoms  Outcome: Ongoing (see interventions/notes)  Intervention: Prevent or Manage Infection  Recent Flowsheet Documentation  Taken 07/18/2024 2300 by Oddis MATSU, GN  Infection Management: aseptic technique maintained  Taken 07/18/2024 1900 by Oddis MATSU, GN  Fever Reduction/Comfort Measures:   lightweight bedding   lightweight clothing  Isolation Precautions: droplet precautions maintained  Infection Management: aseptic technique maintained  Goal: Improved Oral Intake  Outcome: Ongoing (see interventions/notes)  Goal: Optimal Pain Control and Function  Outcome: Ongoing (see interventions/notes)  Intervention: Prevent or Manage Pain  Recent Flowsheet Documentation  Taken 07/18/2024 1900 by Oddis MATSU, GN  Sleep/Rest Enhancement:   awakenings minimized   consistent schedule promoted  Goal: Skin Health and Integrity  Outcome: Ongoing (see interventions/notes)  Intervention: Optimize Skin Protection  Recent Flowsheet Documentation  Taken 07/18/2024 2300 by Oddis MATSU, GN  Pressure Reduction Techniques:   Heels elevated off of the bed   Patient turned q 2 hours   Pressure points protected   Frequent weight  shifting encouraged   Moisture, shear and nutrition are maximized  Pressure Reduction Devices:   Heel offloading device utilized   Specialty bed/mattress utilized   Sacral dressing utilized if not incontinent of bowel  Activity Management: bedrest  Taken 07/18/2024 1900 by Oddis MATSU, GN  Pressure Reduction Techniques:   Heels elevated off of the bed   Patient turned q 2 hours   Pressure points protected   Frequent weight shifting encouraged   Moisture, shear and nutrition are maximized  Pressure Reduction Devices:   Heel offloading device utilized   Specialty bed/mattress utilized   Sacral dressing utilized if not incontinent of bowel  Activity Management: bedrest  Goal: Optimal Wound Healing  Outcome: Ongoing (see interventions/notes)  Intervention: Promote Wound Healing  Recent Flowsheet Documentation  Taken 07/18/2024 2300 by Oddis MATSU, GN  Pressure Reduction Techniques:   Heels elevated off of the bed   Patient turned q 2 hours   Pressure points protected   Frequent weight shifting encouraged   Moisture, shear and nutrition are maximized  Pressure Reduction Devices:   Heel offloading device utilized   Specialty bed/mattress utilized   Sacral dressing utilized if not incontinent of bowel  Activity Management: bedrest  Taken 07/18/2024 1900 by Oddis MATSU, GN  Pressure Reduction Techniques:   Heels elevated off of the bed   Patient turned q 2 hours   Pressure points protected   Frequent weight shifting encouraged   Moisture, shear and nutrition are maximized  Pressure Reduction Devices:   Heel offloading device utilized   Specialty bed/mattress utilized   Sacral dressing utilized if not incontinent of bowel  Sleep/Rest Enhancement:   awakenings minimized   consistent schedule promoted  Activity Management: bedrest     Problem: Adult  Inpatient Plan of Care  Goal: Plan of Care Review  Outcome: Ongoing (see interventions/notes)  Goal: Patient-Specific Goal (Individualized)  Outcome: Ongoing (see  interventions/notes)  Flowsheets (Taken 07/18/2024 1900)  Individualized Care Needs: monitor labs, titrate pressors  Anxieties, Fears or Concerns: UTA  Goal: Optimal Comfort and Wellbeing  Outcome: Ongoing (see interventions/notes)  Intervention: Provide Person-Centered Care  Recent Flowsheet Documentation  Taken 07/18/2024 2300 by Oddis MATSU, GN  Trust Relationship/Rapport:   care explained   emotional support provided  Taken 07/18/2024 1900 by Oddis MATSU, GN  Trust Relationship/Rapport:   care explained   emotional support provided  Goal: Rounds/Family Conference  Outcome: Ongoing (see interventions/notes)     Problem: Malnutrition  Goal: Improved Nutritional Intake  Outcome: Ongoing (see interventions/notes)     Problem: Mechanical Ventilation Invasive  Goal: Effective Communication  Outcome: Ongoing (see interventions/notes)  Intervention: Ensure Effective Communication  Recent Flowsheet Documentation  Taken 07/18/2024 2300 by Oddis MATSU, GN  Trust Relationship/Rapport:   care explained   emotional support provided  Family/Support System Care:   caregiver stress acknowledged   support provided  Taken 07/18/2024 1900 by Oddis MATSU, GN  Trust Relationship/Rapport:   care explained   emotional support provided  Family/Support System Care:   caregiver stress acknowledged   support provided  Goal: Optimal Device Function  Outcome: Ongoing (see interventions/notes)  Intervention: Optimize Device Care and Function  Recent Flowsheet Documentation  Taken 07/18/2024 2300 by Oddis MATSU, GN  Airway Safety Measures:   mask valve resuscitator at bedside   oxygen flowmeter at bedside   suction at bedside  Airway/Ventilation Management:   airway patency maintained   calming measures promoted  Taken 07/18/2024 1900 by Oddis MATSU, GN  Airway Safety Measures:   mask valve resuscitator at bedside   oxygen flowmeter at bedside   suction at bedside  Airway/Ventilation Management:   airway patency maintained   calming measures promoted  Goal: Mechanical  Ventilation Liberation  Outcome: Ongoing (see interventions/notes)  Intervention: Promote Extubation and Mechanical Ventilation Liberation  Recent Flowsheet Documentation  Taken 07/18/2024 1900 by Oddis MATSU, GN  Sleep/Rest Enhancement:   awakenings minimized   consistent schedule promoted  Medication Review/Management: medications reviewed  Goal: Optimal Nutrition Delivery  Outcome: Ongoing (see interventions/notes)  Goal: Absence of Device-Related Skin and Tissue Injury  Outcome: Ongoing (see interventions/notes)  Intervention: Maintain Skin and Tissue Health  Recent Flowsheet Documentation  Taken 07/18/2024 2300 by Oddis MATSU, GN  Device Skin Pressure Protection:   absorbent pad utilized/changed   adhesive use limited   tubing/devices free from skin contact  Taken 07/18/2024 1900 by Oddis MATSU, GN  Device Skin Pressure Protection:   absorbent pad utilized/changed   adhesive use limited   tubing/devices free from skin contact  Goal: Absence of Ventilator-Induced Lung Injury  Outcome: Ongoing (see interventions/notes)  Intervention: Prevent Ventilator-Associated Pneumonia  Recent Flowsheet Documentation  Taken 07/18/2024 2300 by Oddis MATSU, GN  VAP Prevention Bundle:   HOB elevation maintained   oral care regularly provided  VAP Prevention Measures: completed  Taken 07/18/2024 1900 by Oddis MATSU, GN  VAP Prevention Bundle:   HOB elevation maintained   oral care regularly provided  VAP Prevention Measures: completed     Problem: Non-violent/Non-Self Destructive Restraints  Goal: Alternative methods tried prior to restraints  Outcome: Ongoing (see interventions/notes)  Goal: Patient free from injury and discomfort  Outcome: Ongoing (see interventions/notes)  Goal: Autonomy maintained at the highest possible level  Outcome: Ongoing (see interventions/notes)  Goal: Need  for restraints reassessed per policy  Outcome: Ongoing (see interventions/notes)  Goal: Patient education provided  Outcome: Ongoing (see interventions/notes)  Goal:  Problem Interventions  Outcome: Ongoing (see interventions/notes)     Problem: Skin Injury Risk Increased  Goal: Skin Health and Integrity  Outcome: Ongoing (see interventions/notes)  Intervention: Optimize Skin Protection  Recent Flowsheet Documentation  Taken 07/18/2024 2300 by Oddis MATSU, GN  Pressure Reduction Techniques:   Heels elevated off of the bed   Patient turned q 2 hours   Pressure points protected   Frequent weight shifting encouraged   Moisture, shear and nutrition are maximized  Pressure Reduction Devices:   Heel offloading device utilized   Specialty bed/mattress utilized   Sacral dressing utilized if not incontinent of bowel  Skin Protection:   adhesive use limited   incontinence pads utilized   tubing/devices free from skin contact  Activity Management: bedrest  Taken 07/18/2024 1900 by Oddis MATSU, GN  Pressure Reduction Techniques:   Heels elevated off of the bed   Patient turned q 2 hours   Pressure points protected   Frequent weight shifting encouraged   Moisture, shear and nutrition are maximized  Pressure Reduction Devices:   Heel offloading device utilized   Specialty bed/mattress utilized   Sacral dressing utilized if not incontinent of bowel  Skin Protection:   adhesive use limited   incontinence pads utilized   tubing/devices free from skin contact  Activity Management: bedrest     Problem: Gas Exchange Impaired  Goal: Optimal Gas Exchange  Outcome: Ongoing (see interventions/notes)  Intervention: Optimize Oxygenation and Ventilation  Recent Flowsheet Documentation  Taken 07/18/2024 2300 by Oddis MATSU, GN  Airway/Ventilation Management:   airway patency maintained   calming measures promoted  Taken 07/18/2024 1900 by Oddis MATSU, GN  Airway/Ventilation Management:   airway patency maintained   calming measures promoted

## 2024-07-18 NOTE — Nurses Notes (Signed)
 Restraint Continuation      Patient continues to have the following condition INTUBATED/RESTLESS.       Least restrictive alternatives attempted : concealed tubes/lines, decreased/removed stimulus, and assessed meds/medical problems    Patient continues to exhibit the following behaviors: uncooperative    The restraint continued to facilitate medical/surgical treatment to ensure safety.    The patient will continue to be evaluated and assessments documented on the flowsheet to ensure that the patient is released from the restraint at the earliest possible time

## 2024-07-18 NOTE — Nurses Notes (Signed)
 Pulmonary team at bedside, pt in Afib RVR. STAT orders received.

## 2024-07-18 NOTE — Consults (Signed)
 [facility]   GI Progress Note    [name], [name], 68 y.o. female  Date of Admission:  07/08/2024  Date of Birth:  15-Sep-1956      SUBJECTIVE:  Remains intubated/sedated on IV Pressors.     Abdomen more tight/distended this AM. Hgb down, 6.9 with plans for transfusion. Nursing reports bleeding from op sites. More jaundiced and scleral icterus today. Tbili 6.6, ALK phos mildly trending up mild improvement in other LFTs.     OBJECTIVE:  Vital Signs:  Temperature: (!) 38.3 C (100.9 F)  Heart Rate: 80  BP (Non-Invasive): (!) 127/53  Respiratory Rate: 13  SpO2: 100 %      Physical Exam  Constitutional:       Appearance: No distress  HENT:      Head: Normocephalic and atraumatic.      Mouth: Mucous membranes are moist     Eyes:PERRLA  Cardiovascular:      Rate and Rhythm: Normal rate and regular rhythm.    Pulmonary:      Breath sounds: clear, diminished   Abdominal:      General: Abdomen is soft, nontender  Skin:     General: Skin is dry.   Neurological:      Mental Status: Alert and oriented x3        LABS:  I have reviewed all lab results.  Lab Results Today:    Results for orders placed or performed during the hospital encounter of 07/08/24 (from the past 24 hours)   TRANSTHORACIC ECHOCARDIOGRAM - ADULT   Result Value Ref Range    EF VISUAL ESTIMATE 55     EF 60    POC BLOOD GLUCOSE (RESULTS)   Result Value Ref Range    GLUCOSE, POC 294 (H) 70 - 100 mg/dl   CBC   Result Value Ref Range    WBC 6.9 3.7 - 11.0 x10^3/uL    RBC 2.45 (L) 3.85 - 5.22 x10^6/uL    HGB 7.1 (L) 11.5 - 16.0 g/dL    HCT 78.4 (L) 65.1 - 46.0 %    MCV 87.8 78.0 - 100.0 fL    MCH 29.0 26.0 - 32.0 pg    MCHC 33.0 31.0 - 35.5 g/dL    RDW-CV 84.9 88.4 - 84.4 %    PLATELETS 42 (L) 150 - 400 x10^3/uL    MPV 10.8 8.7 - 12.5 fL   COMPREHENSIVE METABOLIC PANEL, NON-FASTING   Result Value Ref Range    SODIUM 149 (H) 133 - 144 mmol/L    POTASSIUM 3.3 3.2 - 5.0 mmol/L    CHLORIDE 103 96 - 106 mmol/L    CO2 TOTAL 27 22 - 30 mmol/L    ANION  GAP 19 (H) 7 - 18 mmol/L    BUN 59 (H) 8 - 23 mg/dL    CREATININE 8.44 (H) 0.50 - 0.90 mg/dL    ESTIMATED GFR 36 (L) >90 mL/min/1.29m^2    ALBUMIN 3.1 (L) 3.5 - 5.2 g/dL    CALCIUM 9.4 8.3 - 89.2 mg/dL    GLUCOSE 793 (H) 74 - 109 mg/dL    ALKALINE PHOSPHATASE 310 (H) 35 - 129 U/L    ALT (SGPT) 1,025 (H) 0 - 33 U/L    AST (SGOT) 2,418 (H) 0 - 32 U/L    BILIRUBIN TOTAL 5.6 (H) 0.2 - 1.2 mg/dL    PROTEIN TOTAL 5.2 (L) 6.4 - 8.3 g/dL   IONIZED CALCIUM WITH PH   Result Value Ref  Range    PH (VENOUS) 7.46 (H) 7.32 - 7.43    IONIZED CALCIUM 1.19 1.15 - 1.33 mmol/L   MAGNESIUM    Result Value Ref Range    MAGNESIUM  1.9 1.6 - 2.4 mg/dL   PHOSPHORUS   Result Value Ref Range    PHOSPHORUS 4.5 2.5 - 4.5 mg/dL   PT/INR   Result Value Ref Range    PROTHROMBIN TIME 20.8 (H) 12.1 - 15.3 seconds    INR 1.77 (H) 0.86 - 1.14   PTT (PARTIAL THROMBOPLASTIN TIME)   Result Value Ref Range    APTT 38.7 (H) 22.4 - 34.8 seconds   FIBRINOGEN   Result Value Ref Range    FIBRINOGEN 305 251 - 576 mg/dL   TEG, RAPID GLOBAL WITH LYSIS (TRAUMA)   Result Value Ref Range    R (CK) >17.0 (H) 4.6 - 9.1 min    LYS30 (%) 0.0 0.0 - 2.6 %    MA (CRT RAPID) 47.3 (L) 52.0 - 70.0 mm    MA FIBRINOGEN (CFF) 19.1 15.0 - 32.0 mm   BLOOD GAS W/ CO-OX, LYTES, LACTATE REFLEX Arterial   Result Value Ref Range    %FIO2 (ARTERIAL) 50 %    PH (ARTERIAL) 7.41 7.35 - 7.45    PCO2 (ARTERIAL) 44 35 - 45 mm/Hg    PO2 (ARTERIAL) 107 83 - 108 mm/Hg    BICARBONATE (ARTERIAL) 27.2 21.0 - 28.0 mmol/L    BASE EXCESS (ARTERIAL) 2.9 0.0 - 3.0 mmol/L    PAO2/FIO2 RATIO 214     O2 SATURATION (ARTERIAL) 98.2 94.0 - 98.0 %    HEMOGLOBIN 9.0 (L) 12.0 - 18.0 g/dL    HEMATOCRITRT 27 (L) 37 - 50 %    OXYHEMOGLOBIN 97.9 (H) 90.0 - 95.0 %    CARBOXYHEMOGLOBIN 1.4 <=3.0 %    MET-HEMOGLOBIN <0.7 <=1.5 %    O2CT 12.6 %    SODIUM 143 136 - 145 mmol/L    WHOLE BLOOD POTASSIUM 3.3 (L) 3.5 - 5.1 mmol/L    CHLORIDE 105 98 - 107 mmol/L    IONIZED CALCIUM 1.22 1.15 - 1.33 mmol/L    GLUCOSE 198 (H) 65 -  125 mg/dL    LACTATE 5.9 (HH) <=8.0 mmol/L   LACTIC ACID LEVEL W/ REFLEX FOR LEVEL >2.0   Result Value Ref Range    LACTIC ACID 6.2 (HH) 0.5 - 2.0 mmol/L   POC BLOOD GLUCOSE (RESULTS)   Result Value Ref Range    GLUCOSE, POC 176 (H) 70 - 100 mg/dl   PRODUCT: CRYOPRECIPITATE - UNITS   Result Value Ref Range    Coding System [id number]     UNIT NUMBER [id number]     BLOOD COMPONENT TYPE Thawed Pooled Cryoprecipitate (x5)     UNIT DIVISION 00     UNIT DISPENSE STATUS ISSUED,FINAL     TRANSFUSION STATUS OK TO TRANSFUSE     Product Code E3591V00     Coding System [id number]     UNIT NUMBER [id number]     BLOOD COMPONENT TYPE Thawed Pooled Cryoprecipitate (x5)     UNIT DIVISION 00     UNIT DISPENSE STATUS ISSUED,FINAL     TRANSFUSION STATUS OK TO TRANSFUSE     Product Code E3591V00    CBC   Result Value Ref Range    WBC 5.5 3.7 - 11.0 x10^3/uL    RBC 3.16 (L) 3.85 - 5.22 x10^6/uL    HGB 9.2 (L) 11.5 - 16.0 g/dL    HCT  26.8 (L) 34.8 - 46.0 %    MCV 84.8 78.0 - 100.0 fL    MCH 29.1 26.0 - 32.0 pg    MCHC 34.3 31.0 - 35.5 g/dL    RDW-CV 85.0 88.4 - 84.4 %    PLATELETS 49 (L) 150 - 400 x10^3/uL    MPV 10.1 8.7 - 12.5 fL   COMPREHENSIVE METABOLIC PANEL, NON-FASTING   Result Value Ref Range    SODIUM 149 (H) 133 - 144 mmol/L    POTASSIUM 3.7 3.2 - 5.0 mmol/L    CHLORIDE 103 96 - 106 mmol/L    CO2 TOTAL 26 22 - 30 mmol/L    ANION GAP 20 (H) 7 - 18 mmol/L    BUN 56 (H) 8 - 23 mg/dL    CREATININE 8.53 (H) 0.50 - 0.90 mg/dL    ESTIMATED GFR 39 (L) >90 mL/min/1.67m^2    ALBUMIN 3.0 (L) 3.5 - 5.2 g/dL    CALCIUM 9.4 8.3 - 89.2 mg/dL    GLUCOSE 849 (H) 74 - 109 mg/dL    ALKALINE PHOSPHATASE 297 (H) 35 - 129 U/L    ALT (SGPT) 1,038 (H) 0 - 33 U/L    AST (SGOT) 2,763 (H) 0 - 32 U/L    BILIRUBIN TOTAL 5.7 (H) 0.2 - 1.2 mg/dL    PROTEIN TOTAL 5.2 (L) 6.4 - 8.3 g/dL   IONIZED CALCIUM WITH PH   Result Value Ref Range    PH (VENOUS) 7.44 (H) 7.32 - 7.43    IONIZED CALCIUM 1.20 1.15 - 1.33 mmol/L   MAGNESIUM    Result Value Ref Range     MAGNESIUM  1.8 1.6 - 2.4 mg/dL   PHOSPHORUS   Result Value Ref Range    PHOSPHORUS 4.7 (H) 2.5 - 4.5 mg/dL   PT/INR   Result Value Ref Range    PROTHROMBIN TIME 21.4 (H) 12.1 - 15.3 seconds    INR 1.84 (H) 0.86 - 1.14   PTT (PARTIAL THROMBOPLASTIN TIME)   Result Value Ref Range    APTT 41.8 (H) 22.4 - 34.8 seconds   RESPIRATORY CULTURE AND GRAM STAIN (PERFORMABLE)    Specimen: Sputum   Result Value Ref Range    GRAM STAIN 4+ Many WBCs     GRAM STAIN 2+ Few Gram Positive Cocci in Pairs     GRAM STAIN 2+ Few Hyphae     GRAM STAIN 2+ Few Yeast    BLOOD GAS W/ CO-OX, LYTES, LACTATE REFLEX Arterial   Result Value Ref Range    %FIO2 (ARTERIAL) 40 %    PH (ARTERIAL) 7.41 7.35 - 7.45    PCO2 (ARTERIAL) 47 (H) 35 - 45 mm/Hg    PO2 (ARTERIAL) 69 (L) 83 - 108 mm/Hg    BICARBONATE (ARTERIAL) 28.3 (H) 21.0 - 28.0 mmol/L    BASE EXCESS (ARTERIAL) 4.4 (H) 0.0 - 3.0 mmol/L    PAO2/FIO2 RATIO 173     O2 SATURATION (ARTERIAL) 93.7 94.0 - 98.0 %    HEMOGLOBIN 11.1 (L) 12.0 - 18.0 g/dL    HEMATOCRITRT 33 (L) 37 - 50 %    OXYHEMOGLOBIN 94.8 90.0 - 95.0 %    CARBOXYHEMOGLOBIN 1.9 <=3.0 %    MET-HEMOGLOBIN <0.7 <=1.5 %    O2CT 14.8 %    SODIUM 143 136 - 145 mmol/L    WHOLE BLOOD POTASSIUM 3.6 3.5 - 5.1 mmol/L    CHLORIDE 104 98 - 107 mmol/L    IONIZED CALCIUM 1.21 1.15 - 1.33 mmol/L  GLUCOSE 138 (H) 65 - 125 mg/dL    LACTATE 5.0 (HH) <=8.0 mmol/L   POC BLOOD GLUCOSE (RESULTS)   Result Value Ref Range    GLUCOSE, POC 188 (H) 70 - 100 mg/dl   CBC   Result Value Ref Range    WBC 6.2 3.7 - 11.0 x10^3/uL    RBC 3.26 (L) 3.85 - 5.22 x10^6/uL    HGB 9.3 (L) 11.5 - 16.0 g/dL    HCT 72.2 (L) 65.1 - 46.0 %    MCV 85.0 78.0 - 100.0 fL    MCH 28.5 26.0 - 32.0 pg    MCHC 33.6 31.0 - 35.5 g/dL    RDW-CV 84.3 (H) 88.4 - 15.5 %    PLATELETS 45 (L) 150 - 400 x10^3/uL   COMPREHENSIVE METABOLIC PANEL, NON-FASTING   Result Value Ref Range    SODIUM 148 (H) 133 - 144 mmol/L    POTASSIUM 3.5 3.2 - 5.0 mmol/L    CHLORIDE 102 96 - 106 mmol/L    CO2 TOTAL 28 22  - 30 mmol/L    ANION GAP 18 7 - 18 mmol/L    BUN 60 (H) 8 - 23 mg/dL    CREATININE 8.34 (H) 0.50 - 0.90 mg/dL    ESTIMATED GFR 34 (L) >90 mL/min/1.81m^2    ALBUMIN 2.9 (L) 3.5 - 5.2 g/dL    CALCIUM 9.4 8.3 - 89.2 mg/dL    GLUCOSE 847 (H) 74 - 109 mg/dL    ALKALINE PHOSPHATASE 308 (H) 35 - 129 U/L    ALT (SGPT) 1,143 (H) 0 - 33 U/L    AST (SGOT) 3,018 (H) 0 - 32 U/L    BILIRUBIN TOTAL 6.4 (H) 0.2 - 1.2 mg/dL    PROTEIN TOTAL 5.2 (L) 6.4 - 8.3 g/dL   IONIZED CALCIUM WITH PH   Result Value Ref Range    PH (VENOUS) 7.45 (H) 7.32 - 7.43    IONIZED CALCIUM 1.20 1.15 - 1.33 mmol/L   MAGNESIUM    Result Value Ref Range    MAGNESIUM  1.8 1.6 - 2.4 mg/dL   PHOSPHORUS   Result Value Ref Range    PHOSPHORUS 4.1 2.5 - 4.5 mg/dL   PT/INR   Result Value Ref Range    PROTHROMBIN TIME 22.8 (H) 12.1 - 15.3 seconds    INR 2.00 (H) 0.86 - 1.14   PTT (PARTIAL THROMBOPLASTIN TIME)   Result Value Ref Range    APTT 46.9 (H) 22.4 - 34.8 seconds   BLOOD GAS W/ CO-OX, LYTES, LACTATE REFLEX Arterial   Result Value Ref Range    %FIO2 (ARTERIAL) 50 %    PH (ARTERIAL) 7.45 7.35 - 7.45    PCO2 (ARTERIAL) 42 35 - 45 mm/Hg    PO2 (ARTERIAL) 96 83 - 108 mm/Hg    BICARBONATE (ARTERIAL) 28.6 (H) 21.0 - 28.0 mmol/L    BASE EXCESS (ARTERIAL) 4.7 (H) 0.0 - 3.0 mmol/L    PAO2/FIO2 RATIO 192     O2 SATURATION (ARTERIAL) 97.8 94.0 - 98.0 %    HEMOGLOBIN 9.8 (L) 12.0 - 18.0 g/dL    HEMATOCRITRT 29 (L) 37 - 50 %    OXYHEMOGLOBIN 96.5 (H) 90.0 - 95.0 %    CARBOXYHEMOGLOBIN 1.4 <=3.0 %    MET-HEMOGLOBIN 1.2 <=1.5 %    O2CT 13.4 %    SODIUM 145 136 - 145 mmol/L    WHOLE BLOOD POTASSIUM 3.4 (L) 3.5 - 5.1 mmol/L    CHLORIDE 104 98 - 107 mmol/L  IONIZED CALCIUM 1.20 1.15 - 1.33 mmol/L    GLUCOSE 138 (H) 65 - 125 mg/dL    LACTATE 3.9 (H) <=8.0 mmol/L   PRODUCT: FFP/PLASMA - UNITS : 1 Units   Result Value Ref Range    Coding System [id number]     UNIT NUMBER [id number]     BLOOD COMPONENT TYPE THAWED PLASMA     UNIT DIVISION 00     UNIT DISPENSE STATUS ISSUED      TRANSFUSION STATUS OK TO TRANSFUSE     Product Code [id number]    CBC   Result Value Ref Range    WBC 7.2 3.7 - 11.0 x10^3/uL    RBC 3.11 (L) 3.85 - 5.22 x10^6/uL    HGB 8.7 (L) 11.5 - 16.0 g/dL    HCT 73.7 (L) 65.1 - 46.0 %    MCV 84.2 78.0 - 100.0 fL    MCH 28.0 26.0 - 32.0 pg    MCHC 33.2 31.0 - 35.5 g/dL    RDW-CV 84.3 (H) 88.4 - 15.5 %    PLATELETS 50 (L) 150 - 400 x10^3/uL   COMPREHENSIVE METABOLIC PANEL, NON-FASTING   Result Value Ref Range    SODIUM 150 (H) 133 - 144 mmol/L    POTASSIUM 3.6 3.2 - 5.0 mmol/L    CHLORIDE 103 96 - 106 mmol/L    CO2 TOTAL 26 22 - 30 mmol/L    ANION GAP 21 (H) 7 - 18 mmol/L    BUN 61 (H) 8 - 23 mg/dL    CREATININE 8.38 (H) 0.50 - 0.90 mg/dL    ESTIMATED GFR 35 (L) >90 mL/min/1.69m^2    ALBUMIN 2.9 (L) 3.5 - 5.2 g/dL    CALCIUM 9.2 8.3 - 89.2 mg/dL    GLUCOSE 857 (H) 74 - 109 mg/dL    ALKALINE PHOSPHATASE 326 (H) 35 - 129 U/L    ALT (SGPT) 1,146 (H) 0 - 33 U/L    AST (SGOT) 2,963 (H) 0 - 32 U/L    BILIRUBIN TOTAL 6.4 (H) 0.2 - 1.2 mg/dL    PROTEIN TOTAL 5.0 (L) 6.4 - 8.3 g/dL   IONIZED CALCIUM WITH PH   Result Value Ref Range    PH (VENOUS) 7.46 (H) 7.32 - 7.43    IONIZED CALCIUM 1.17 1.15 - 1.33 mmol/L   MAGNESIUM    Result Value Ref Range    MAGNESIUM  1.9 1.6 - 2.4 mg/dL   PHOSPHORUS   Result Value Ref Range    PHOSPHORUS 4.1 2.5 - 4.5 mg/dL   PT/INR   Result Value Ref Range    PROTHROMBIN TIME 22.3 (H) 12.1 - 15.3 seconds    INR 1.94 (H) 0.86 - 1.14   PTT (PARTIAL THROMBOPLASTIN TIME)   Result Value Ref Range    APTT 44.8 (H) 22.4 - 34.8 seconds   LACTIC ACID - FIRST REFLEX   Result Value Ref Range    LACTIC ACID 5.3 (HH) 0.5 - 2.0 mmol/L   BLOOD GAS W/ CO-OX, LYTES, LACTATE REFLEX Arterial   Result Value Ref Range    %FIO2 (ARTERIAL) 40 %    PH (ARTERIAL) 7.44 7.35 - 7.45    PCO2 (ARTERIAL) 42 35 - 45 mm/Hg    PO2 (ARTERIAL) 78 (L) 83 - 108 mm/Hg    BICARBONATE (ARTERIAL) 28.0 21.0 - 28.0 mmol/L    BASE EXCESS (ARTERIAL) 3.9 (H) 0.0 - 3.0 mmol/L    PAO2/FIO2 RATIO 195     O2  SATURATION (ARTERIAL) 95.9 94.0 - 98.0 %  HEMOGLOBIN 9.3 (L) 12.0 - 18.0 g/dL    HEMATOCRITRT 28 (L) 37 - 50 %    OXYHEMOGLOBIN 95.6 (H) 90.0 - 95.0 %    CARBOXYHEMOGLOBIN 1.5 <=3.0 %    MET-HEMOGLOBIN 0.8 <=1.5 %    O2CT 12.6 %    SODIUM 145 136 - 145 mmol/L    WHOLE BLOOD POTASSIUM 3.4 (L) 3.5 - 5.1 mmol/L    CHLORIDE 104 98 - 107 mmol/L    IONIZED CALCIUM 1.19 1.15 - 1.33 mmol/L    GLUCOSE 118 65 - 125 mg/dL    LACTATE 4.7 (HH) <=8.0 mmol/L   POC BLOOD GLUCOSE (RESULTS)   Result Value Ref Range    GLUCOSE, POC 136 (H) 70 - 100 mg/dl   CBC   Result Value Ref Range    WBC 6.2 3.7 - 11.0 x10^3/uL    RBC 2.83 (L) 3.85 - 5.22 x10^6/uL    HGB 8.0 (L) 11.5 - 16.0 g/dL    HCT 75.7 (L) 65.1 - 46.0 %    MCV 85.5 78.0 - 100.0 fL    MCH 28.3 26.0 - 32.0 pg    MCHC 33.1 31.0 - 35.5 g/dL    RDW-CV 84.2 (H) 88.4 - 15.5 %    PLATELETS 36 (L) 150 - 400 x10^3/uL    MPV 11.3 8.7 - 12.5 fL   COMPREHENSIVE METABOLIC PANEL, NON-FASTING   Result Value Ref Range    SODIUM 148 (H) 133 - 144 mmol/L    POTASSIUM 4.1 3.2 - 5.0 mmol/L    CHLORIDE 103 96 - 106 mmol/L    CO2 TOTAL 25 22 - 30 mmol/L    ANION GAP 20 (H) 7 - 18 mmol/L    BUN 61 (H) 8 - 23 mg/dL    CREATININE 8.41 (H) 0.50 - 0.90 mg/dL    ESTIMATED GFR 36 (L) >90 mL/min/1.75m^2    ALBUMIN 3.0 (L) 3.5 - 5.2 g/dL    CALCIUM 9.1 8.3 - 89.2 mg/dL    GLUCOSE 859 (H) 74 - 109 mg/dL    ALKALINE PHOSPHATASE 305 (H) 35 - 129 U/L    ALT (SGPT) 1,130 (H) 0 - 33 U/L    AST (SGOT) 2,911 (H) 0 - 32 U/L    BILIRUBIN TOTAL 6.8 (H) 0.2 - 1.2 mg/dL    PROTEIN TOTAL 5.1 (L) 6.4 - 8.3 g/dL   IONIZED CALCIUM WITH PH   Result Value Ref Range    PH (VENOUS) 7.42 7.32 - 7.43    IONIZED CALCIUM 1.14 (L) 1.15 - 1.33 mmol/L   MAGNESIUM    Result Value Ref Range    MAGNESIUM  2.4 1.6 - 2.4 mg/dL   PHOSPHORUS   Result Value Ref Range    PHOSPHORUS 4.3 2.5 - 4.5 mg/dL   PT/INR   Result Value Ref Range    PROTHROMBIN TIME 23.8 (H) 12.1 - 15.3 seconds    INR 2.12 (H) 0.86 - 1.14   PTT (PARTIAL THROMBOPLASTIN  TIME)   Result Value Ref Range    APTT 47.0 (H) 22.4 - 34.8 seconds   LACTIC ACID - FIRST REFLEX   Result Value Ref Range    LACTIC ACID 5.9 (HH) 0.5 - 2.0 mmol/L   BLOOD GAS W/ CO-OX, LYTES, LACTATE REFLEX Arterial   Result Value Ref Range    %FIO2 (ARTERIAL) 40 %    PH (ARTERIAL) 7.40 7.35 - 7.45    PCO2 (ARTERIAL) 44 35 - 45 mm/Hg    PO2 (ARTERIAL) 81 (L) 83 -  108 mm/Hg    BICARBONATE (ARTERIAL) 26.6 21.0 - 28.0 mmol/L    BASE EXCESS (ARTERIAL) 2.2 0.0 - 3.0 mmol/L    PAO2/FIO2 RATIO 203     O2 SATURATION (ARTERIAL) 95.9 94.0 - 98.0 %    HEMOGLOBIN 8.6 (L) 12.0 - 18.0 g/dL    HEMATOCRITRT 26 (L) 37 - 50 %    OXYHEMOGLOBIN 95.1 (H) 90.0 - 95.0 %    CARBOXYHEMOGLOBIN 1.4 <=3.0 %    MET-HEMOGLOBIN 1.1 <=1.5 %    O2CT 11.6 %    SODIUM 143 136 - 145 mmol/L    WHOLE BLOOD POTASSIUM 4.0 3.5 - 5.1 mmol/L    CHLORIDE 105 98 - 107 mmol/L    IONIZED CALCIUM 1.19 1.15 - 1.33 mmol/L    GLUCOSE 135 (H) 65 - 125 mg/dL    LACTATE 5.9 (HH) <=8.0 mmol/L   POC BLOOD GLUCOSE (RESULTS)   Result Value Ref Range    GLUCOSE, POC 149 (H) 70 - 100 mg/dl   PRODUCT: FFP/PLASMA - UNITS : 3 Units   Result Value Ref Range    Coding System [id number]     UNIT NUMBER [id number]     BLOOD COMPONENT TYPE THAWED PLASMA     UNIT DIVISION 00     UNIT DISPENSE STATUS ISSUED     TRANSFUSION STATUS OK TO TRANSFUSE     Product Code [id number]    VANCOMYCIN, RANDOM   Result Value Ref Range    VANCOMYCIN RANDOM 8.1 See Comment ug/mL    DOSE DATE 07/18/2024    LACTIC ACID - SECOND REFLEX   Result Value Ref Range    LACTIC ACID 4.7 (HH) 0.5 - 2.0 mmol/L   CBC   Result Value Ref Range    WBC 6.2 3.7 - 11.0 x10^3/uL    RBC 2.63 (L) 3.85 - 5.22 x10^6/uL    HGB 7.5 (L) 11.5 - 16.0 g/dL    HCT 77.1 (L) 65.1 - 46.0 %    MCV 86.7 78.0 - 100.0 fL    MCH 28.5 26.0 - 32.0 pg    MCHC 32.9 31.0 - 35.5 g/dL    RDW-CV 84.0 (H) 88.4 - 15.5 %    PLATELETS 35 (L) 150 - 400 x10^3/uL    MPV 10.1 8.7 - 12.5 fL   COMPREHENSIVE METABOLIC PANEL, NON-FASTING   Result Value Ref Range     SODIUM 149 (H) 133 - 144 mmol/L    POTASSIUM 3.6 3.2 - 5.0 mmol/L    CHLORIDE 101 96 - 106 mmol/L    CO2 TOTAL 27 22 - 30 mmol/L    ANION GAP 21 (H) 7 - 18 mmol/L    BUN 63 (H) 8 - 23 mg/dL    CREATININE 8.39 (H) 0.50 - 0.90 mg/dL    ESTIMATED GFR 35 (L) >90 mL/min/1.3m^2    ALBUMIN 3.1 (L) 3.5 - 5.2 g/dL    CALCIUM 9.1 8.3 - 89.2 mg/dL    GLUCOSE 824 (H) 74 - 109 mg/dL    ALKALINE PHOSPHATASE 294 (H) 35 - 129 U/L    ALT (SGPT) 1,082 (H) 0 - 33 U/L    AST (SGOT) 2,707 (H) 0 - 32 U/L    BILIRUBIN TOTAL 6.6 (H) 0.2 - 1.2 mg/dL    PROTEIN TOTAL 5.3 (L) 6.4 - 8.3 g/dL   IONIZED CALCIUM WITH PH   Result Value Ref Range    PH (VENOUS) 7.38 7.32 - 7.43    IONIZED CALCIUM 1.10 (L) 1.15 - 1.33  mmol/L   MAGNESIUM    Result Value Ref Range    MAGNESIUM  2.2 1.6 - 2.4 mg/dL   PHOSPHORUS   Result Value Ref Range    PHOSPHORUS 4.2 2.5 - 4.5 mg/dL   PT/INR   Result Value Ref Range    PROTHROMBIN TIME 22.6 (H) 12.1 - 15.3 seconds    INR 1.97 (H) 0.86 - 1.14   PTT (PARTIAL THROMBOPLASTIN TIME)   Result Value Ref Range    APTT 46.5 (H) 22.4 - 34.8 seconds   BLOOD GAS W/ CO-OX, LYTES, LACTATE REFLEX Arterial   Result Value Ref Range    %FIO2 (ARTERIAL) 40 %    PH (ARTERIAL) 7.39 7.35 - 7.45    PCO2 (ARTERIAL) 49 (H) 35 - 45 mm/Hg    PO2 (ARTERIAL) 95 83 - 108 mm/Hg    BICARBONATE (ARTERIAL) 28.2 (H) 21.0 - 28.0 mmol/L    BASE EXCESS (ARTERIAL) 4.2 (H) 0.0 - 3.0 mmol/L    PAO2/FIO2 RATIO 238     O2 SATURATION (ARTERIAL) 97.3 94.0 - 98.0 %    HEMOGLOBIN 8.0 (L) 12.0 - 18.0 g/dL    HEMATOCRITRT 24 (L) 37 - 50 %    OXYHEMOGLOBIN 97.3 (H) 90.0 - 95.0 %    CARBOXYHEMOGLOBIN 1.4 <=3.0 %    MET-HEMOGLOBIN <0.7 <=1.5 %    O2CT 11.1 %    SODIUM 144 136 - 145 mmol/L    WHOLE BLOOD POTASSIUM 3.5 3.5 - 5.1 mmol/L    CHLORIDE 104 98 - 107 mmol/L    IONIZED CALCIUM 1.17 1.15 - 1.33 mmol/L    GLUCOSE 165 (H) 65 - 125 mg/dL    LACTATE 4.8 (HH) <=8.0 mmol/L       Radiology Results:   Results for orders placed or performed during the hospital  encounter of 07/08/24 (from the past 72 hours)   CT BRAIN WO IV CONTRAST     Status: Abnormal    Narrative    Sharni JEAN Habibi    CT BRAIN WO IV CONTRAST performed on 07/15/2024 8:16 AM.    INDICATION:  Fall   Additional History:  fall, nausea, weakness    TECHNIQUE:  Unenhanced CT head with axial, coronal, and sagittal multiplanar reformations. Dose modulation, automated exposure control, and/or iterative reconstruction were used for dose reduction.    COMPARISON: June 14, 2024    FINDINGS:  There is a small amount of acute blood products in the subdural space overlying the left frontal lobe anteriorly and inferiorly. This measures 6 mm in thickness. A small collection of blood in the subdural space along the high convexity of the left frontal lobe is also seen. There is very mild mass effect on the left frontal lobe with no significant effacement of the ventricular system, edema or midline shift.    Elsewhere, the brain, ventricles and subarachnoid spaces are within normal limits. There is no evidence of acute large vessel infarct. No acute blood products are seen elsewhere. The calvarium appears intact.              Impression    Small volume of acute hemorrhage in the subdural space overlying the left frontal lobe anteriorly and superiorly as detailed above. No significant change otherwise seen.  .  .  .  s/d/g    A Critical Red actionable finding has been sent via the [employer] Actionable Findings application on 07/15/2024 8:26 AM, Message ID [id number]. Receipt of this communication will be communicated to [facility] ED RADIOLOGY STAFF or responsible  provider and will be documented in [employer] Actionable Findings System upon receiving the acknowledgement.    A Critical Red actionable finding has been sent via the [employer] Actionable Findings application on 07/15/2024 8:26 AM, Message ID [id number]. Receipt of this communication will be communicated to [facility] RADIOLOGY STAFF or responsible provider and will  be documented in [employer] Actionable Findings System upon receiving the acknowledgement.      Radiologist location ID: [id number]     CT BRAIN WO IV CONTRAST     Status: None    Narrative    Mersadies JEAN Molinari    CT BRAIN WO IV CONTRAST performed on 07/16/2024 6:33 AM.    INDICATION:  Subdural hematoma   Additional History:  Subdural hematoma    TECHNIQUE:  Unenhanced CT head with axial, coronal, and sagittal multiplanar reformations. Dose modulation, automated exposure control, and/or iterative reconstruction were used for dose reduction.    COMPARISON: Previous day    FINDINGS:  Small amount of acute blood in the subdural space overlying the left frontal lobe anteriorly and also superiorly is stable in appearance. No significant mass effect or midline shift.    No acute blood products are seen elsewhere. No adverse interval change is seen.              Impression    Stable exam  .  .  .  s/d/g        Radiologist location ID: [id number]     NUC CHOLESCINTIGRAPHY HEPATO IMAGING WO EF     Status: None    Narrative    Dionna JEAN Longmire    NUC CHOLESCINTIGRAPHY HEPATO SYSTEM WITHOUT PHARM performed on 07/16/2024 6:44 PM.    INDICATION:  Increased bilirubin s/p cholecystectomy   Additional History:  abdominal pain, recent cholecystectomy, elevated liver enzymes    TECHNIQUE:  Routine hepatobiliary scan performed with sequential anterior planar imaging of the abdomen.    RADIOPHARMACEUTICAL:  6.9mCi of Tc99m Mebrofenin IV    * Anterior images extending to 4 hours were completed.  * Activity is demonstrated throughout the liver which appears dominant in size, difficult to determine by nuclear medicine study, however enlargement CT 07/13/2024.  * There is marked delay in activity within the biliary system, demonstrated on the 2 and 4 hour delayed images, indicating hepatocellular dysfunction, clinical assessment required.  * 4 hour image reveals subtle increased activity lateral to the lower right hepatic lobe, extending  into the right abdomen and pelvis, in the expected region of the paracolic gutter. This is certainly worrisome for bile duct leak.      Impression    * IMPRESSION:    * Study indicating hepatic dysfunction with delay in activity within the biliary system, activity within the expected region of the right paracolic gutter on 4 hour study is worrisome for but not clearly diagnostic of a bile duct leak, ERCP with positive contrast evaluation may certainly be of value      Radiologist location ID: [id number]     XR CHEST AP     Status: None    Narrative    Sophie JEAN Mahaffy    XR CHEST AP performed on 07/17/2024 1:33 AM.    INDICATION:  hypoxia   Additional History:  central line placement check; hypoxia    TECHNIQUE:  Single frontal view of the chest.  1 views/1 images submitted for interpretation.    COMPARISON:  Chest radiograph 07/08/2024  ___________________________________  FINDINGS:    Right IJ central venous catheter  terminates at the lower SVC. Sternal plates and wires appear intact.  Cardiomediastinal silhouette is normal in size. Atherosclerotic calcification of the aortic knob.  Known right hilar adenopathy. Negative for pleural effusion. Biapical predominant coarsened interstitial markings likely reflecting chronic parenchymal changes. Negative for pneumothorax.  ___________________________________    Impression    1. Right IJ central venous catheter in appropriate position.  2. Chronic interstitial changes similar to prior radiograph; superimposed infectious process difficult to exclude.      Radiologist location ID: [id number]     CT BRAIN WO IV CONTRAST     Status: None    Narrative    Fantasy JEAN Dunlow    CT BRAIN WO IV CONTRAST performed on 07/17/2024 3:13 AM.    INDICATION:  known SDH, AMS   Additional History:  known SDH, AMS, mechanical ventilation    TECHNIQUE:  Unenhanced CT head with axial, coronal, and sagittal multiplanar reformations. Dose modulation, automated exposure control, and/or iterative  reconstruction were used for dose reduction.    COMPARISON:  CT 07/16/2024  ___________________________________  FINDINGS:    Small volume extra-axial subdural hemorrhage along the anterior and superior left frontal lobe measuring 4 mm (series 10, image 21) and 3 mm (series 10, image 25) respectively not changed from prior CT. Negative for new or increasing blood products. Negative for midline shift.  The ventricles are within normal limits for size, configuration, and symmetry.     The grey-white differentiation is well preserved throughout, with no evidence of acute infarct.    Brain parenchymal volume is appropriate for age. Mild periventricular patchy hypoattenuation reflecting sequela chronic microvascular ischemic change.    Osseous structures are unremarkable.  Left maxillary and scattered ethmoid air cell mucosal thickening. Remaining paranasal sinuses are clear.  Mastoid air cells are clear.   ___________________________________    Impression    Small-volume left-sided subdural hemorrhage similar to prior CT. Negative for new or increasing blood products.        Radiologist location ID: [id number]     CT CHEST ABDOMEN PELVIS WO IV CONTRAST     Status: Abnormal    Narrative    Tyreanna JEAN Horsman    CT CHEST ABDOMEN PELVIS WO IV CONTRAST performed on 07/17/2024 3:15 AM.    INDICATION:  AMS, lethargy, hypotension   Additional History:  AMS, lethargy, hypotension, mechanical ventilation, history of CABG    TECHNIQUE:  CT chest, abdomen, and pelvis with axial, coronal, and sagittal multiplanar reformations, performed without contrast. Dose modulation, automated exposure control, and/or iterative reconstruction were used for dose reduction.    COMPARISON:  CT 07/08/2024, CT 07/12/2024, CT 07/13/2024.  ___________________________________  FINDINGS:    CT CHEST:    LUNGS / AIRWAYS: Groundglass opacities of the right upper and left upper lobes as well as the lingula which are new from 07/08/2024 CT. Right perihilar mass  is poorly defined given contiguity with the mediastinum on this noncontrast examination, however mass appears grossly similar in size to 07/08/2024 CT. Increasing interstitial prominence at the peripheral aspect of the mass (series 3, image 32).    HEART / GREAT VESSELS: Heart is stable in size. Thoracic aorta is unremarkable. Atherosclerotic calcification of the coronary arteries is severe.    MEDIASTINUM: Confluent right hilar and mediastinal lymphadenopathy appears similar to 07/08/2024 CT of the structures otherwise poorly evaluated on noncontrast examination. Negative for axillary lymphadenopathy.    OTHER:  The visualized thyroid gland is unremarkable.  Endotracheal tube terminates above the carina but insinuates toward  the right mainstem bronchus (series 6, image 53). Enteric tube terminates in the stomach.    CT ABDOMEN/PELVIS:    HEPATOBILIARY:  Hepatomegaly measuring approximately 21.6 cm in maximum craniocaudal dimension. Mild periportal edema. Negative for apparent focal liver lesion, limited in evaluation on this noncontrast examination.  Hematoma in the gallbladder fossa not changed from 07/13/2024 CT.  Negative for apparent intrahepatic biliary ductal dilation. Extrahepatic bile duct are well-visualized.  PANCREAS: No focal masses or ductal dilatation.  SPLEEN: No splenomegaly.    ADRENALS: No adrenal nodules.  KIDNEYS/URETERS/BLADDER: Negative for hydroureteronephrosis. Multiple nonobstructing calculi bilaterally similar to prior CT. Urinary bladder is decompressed, limited for evaluation.  REPRODUCTIVE: Negative for acute abnormality.    PERITONEUM / RETROPERITONEUM: Large volume hemoperitoneum which is predominantly low attenuation with layering increased attenuation in the pelvic cul-de-sac increased in volume from 07/13/2024 CT. Negative for pneumoperitoneum.  LYMPH NODES: No lymphadenopathy.  VESSELS: Abdominal aorta is normal in caliber.    GI TRACT: No distention, wall thickening, or inflammatory  stranding.  The appendix is not seen, but there are no secondary signs of appendicitis.  ABDOMINAL WALL:  Postsurgical change of the ventral abdominal wall. Few subcutaneous hematomas in the right ventral abdominal wall not changed from prior CT.    SOFT TISSUES: Unremarkable.    CT BONES:  Negative for acute displaced fracture or dislocation.  ___________________________________    Impression    1. Large volume hemoperitoneum, increased from 07/13/2024 CT. Persistent hyperattenuating material in the gallbladder fossa. Correlate with concern for ongoing extravasation.  2. Groundglass opacities in the right greater than left upper lobes new from prior CT suspicious for developing multifocal infectious process.   3. Endotracheal tube terminates above the carina but insinuates towards the right mainstem bronchus; consider slight retraction. Enteric tube terminating in the gastric body.  4. Right infrahilar mass poorly delineated on noncontrast examination, similar in size to 07/08/2024 CT. Increasing adjacent groundglass opacity and nodularity which could represent infectious/inflammatory process versus progression.  5. Additional chronic findings as above.      A Critical Red actionable finding has been sent via the [employer] Actionable Findings application on 07/17/2024 3:57 AM, Message ID [id number]. Receipt of this communication will be communicated to [facility] RADIOLOGY STAFF or responsible provider and will be documented in [employer] Actionable Findings System upon receiving the acknowledgement.      Radiologist location ID: [id number]            ASSESSMENT:    Active Hospital Problems    Diagnosis    Primary Problem: Acute hypoxic respiratory failure (CMS HCC)    Elevated LFTs    Hemoperitoneum    Bile leak    Subdural hematoma (CMS HCC)    NSVT (nonsustained ventricular tachycardia) (CMS HCC)    Heart failure, unspecified HF chronicity, unspecified heart failure type (CMS HCC)    Acute cholecystitis     Thickening of wall of gallbladder with pericholecystic fluid    Symptomatic anemia    COVID-19    Acute blood loss anemia    Transaminitis    Malignant neoplasm of lung, unspecified laterality, unspecified part of lung (CMS HCC)    Pulmonary HTN (CMS HCC)    Pneumonia due to COVID-19 virus    Mood disorder (CMS HCC)    Anemia, unspecified type    Cigarette smoker    Metastatic cancer to liver    History of breast cancer    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 Hgb down, 6.9 and more bleeding noted per nursing. Surgery following. INR 1.97, plt count 34. Tbili 6.6, AST and ALT improving, ALK phos 308. Elevations likely multifactorial. Patient too unstable for any invasive endoscopic procedure. Nursing reports plans to discuss patient's status with family today. Overall poor prognosis. GI will be available as needed.     I independently of the attending provider spent a total of (35) minutes in direct/indirect care of this patient including initial evaluation, review of laboratory, radiology, diagnostic studies, review of medical record, order entry and coordination of care.     Rosina Talbert Ng, APRN, CNP      I have seen and examined the patient personally and  in concert with the Rosina Talbert Ng, APRN, CNP as noted above.  The above note has been modified as necessary by me to reflect my own, personal collection/validation of the patient's HPI, patient history, physical examination, and assessment and plan.  Remains intubated/sedated on IV pressors. Hgb down, 6.9 and more bleeding noted per nursing. Surgery following. INR 1.97, plt count 34. Tbili 6.6, AST and ALT improving, ALK phos 308. Elevations likely multifactorial. Patient too unstable for any invasive endoscopic procedure. Nursing reports plans to discuss patient's status with family  today. Overall poor prognosis. GI will be available as needed.   Reedy Biernat, MD  GI  Duration of visit 35 minutes

## 2024-07-18 NOTE — Progress Notes (Signed)
 [facility]    Cardiology Inpatient Progress Note    [name], [name], 68 y.o. female  Encounter Start Date:  07/08/2024  Inpatient Admission Date: 07/08/2024   Date of Service: 07/18/24  Date of Birth:  [birth year]  PCP:  Garvin Lemmings, FNP    Subjective:  Patient remains sedated on mechanical ventilation.  Converted to sinus rhythm and remains on amiodarone  IV.         Exam:  Temperature: 37.6 C (99.7 F)  Heart Rate: 79  BP (Non-Invasive): (!) 127/53  Respiratory Rate: 13  SpO2: 90 %    Intake/Output Summary (Last 24 hours) at 07/18/2024 1544  Last data filed at 07/18/2024 1500  Gross per 24 hour   Intake 4466.47 ml   Output 1785 ml   Net 2681.47 ml      General/Constitutional:  Sedated on mechanical ventilation  Heart/Cardiovascular:   Regular rate and rhythm, S1,S2 are normal  Respiratory/Lungs:  Diminished breath sounds with coarse sounds throughout  Extremities:  No lower extremity edema   Neurological:  Sedated on mechanical ventilation    Labs:    Results for orders placed or performed during the hospital encounter of 07/08/24 (from the past 24 hours)   POC BLOOD GLUCOSE (RESULTS)    Collection Time: 07/17/24  8:40 PM   Result Value Ref Range    GLUCOSE, POC 188 (H) 70 - 100 mg/dl   CBC    Collection Time: 07/17/24  9:35 PM   Result Value Ref Range    WBC 6.2 3.7 - 11.0 x10^3/uL    RBC 3.26 (L) 3.85 - 5.22 x10^6/uL    HGB 9.3 (L) 11.5 - 16.0 g/dL    HCT 72.2 (L) 65.1 - 46.0 %    MCV 85.0 78.0 - 100.0 fL    MCH 28.5 26.0 - 32.0 pg    MCHC 33.6 31.0 - 35.5 g/dL    RDW-CV 84.3 (H) 88.4 - 15.5 %    PLATELETS 45 (L) 150 - 400 x10^3/uL    Narrative    Mean Platelet Volume - Not Reported   COMPREHENSIVE METABOLIC PANEL, NON-FASTING    Collection Time: 07/17/24  9:35 PM   Result Value Ref Range    SODIUM 148 (H) 133 - 144 mmol/L    POTASSIUM 3.5 3.2 - 5.0 mmol/L    CHLORIDE 102 96 - 106 mmol/L    CO2 TOTAL 28 22 - 30 mmol/L    ANION GAP 18 7 - 18 mmol/L    BUN 60 (H) 8 -  23 mg/dL    CREATININE 8.34 (H) 0.50 - 0.90 mg/dL    ESTIMATED GFR 34 (L) >90 mL/min/1.91m^2    ALBUMIN 2.9 (L) 3.5 - 5.2 g/dL    CALCIUM 9.4 8.3 - 89.2 mg/dL    GLUCOSE 847 (H) 74 - 109 mg/dL    ALKALINE PHOSPHATASE 308 (H) 35 - 129 U/L    ALT (SGPT) 1,143 (H) 0 - 33 U/L    AST (SGOT) 3,018 (H) 0 - 32 U/L    BILIRUBIN TOTAL 6.4 (H) 0.2 - 1.2 mg/dL    PROTEIN TOTAL 5.2 (L) 6.4 - 8.3 g/dL   IONIZED CALCIUM WITH PH    Collection Time: 07/17/24  9:35 PM   Result Value Ref Range    PH (VENOUS) 7.45 (H) 7.32 - 7.43    IONIZED CALCIUM 1.20 1.15 - 1.33 mmol/L   MAGNESIUM     Collection Time: 07/17/24  9:35 PM   Result Value  Ref Range    MAGNESIUM  1.8 1.6 - 2.4 mg/dL   PHOSPHORUS    Collection Time: 07/17/24  9:35 PM   Result Value Ref Range    PHOSPHORUS 4.1 2.5 - 4.5 mg/dL   PT/INR    Collection Time: 07/17/24  9:35 PM   Result Value Ref Range    PROTHROMBIN TIME 22.8 (H) 12.1 - 15.3 seconds    INR 2.00 (H) 0.86 - 1.14    Narrative    In the setting of warfarin therapy, a moderate-intensity INR goal range is 2.0 to 3.0 and a high-intensity INR goal range is 2.5 to 3.5.    INR is ONLY validated to determine the level of anticoagulation with vitamin K antagonists (warfarin). Other factors may elevate the INR including but not limited to direct oral anticoagulants (DOACs), liver dysfunction, vitamin K deficiency, DIC, factor deficiencies, and factor inhibitors.   PTT (PARTIAL THROMBOPLASTIN TIME)    Collection Time: 07/17/24  9:35 PM   Result Value Ref Range    APTT 46.9 (H) 22.4 - 34.8 seconds   BLOOD GAS W/ CO-OX, LYTES, LACTATE REFLEX Arterial    Collection Time: 07/17/24  9:40 PM   Result Value Ref Range    %FIO2 (ARTERIAL) 50 %    PH (ARTERIAL) 7.45 7.35 - 7.45    PCO2 (ARTERIAL) 42 35 - 45 mm/Hg    PO2 (ARTERIAL) 96 83 - 108 mm/Hg    BICARBONATE (ARTERIAL) 28.6 (H) 21.0 - 28.0 mmol/L    BASE EXCESS (ARTERIAL) 4.7 (H) 0.0 - 3.0 mmol/L    PAO2/FIO2 RATIO 192     O2 SATURATION (ARTERIAL) 97.8 94.0 - 98.0 %    HEMOGLOBIN  9.8 (L) 12.0 - 18.0 g/dL    HEMATOCRITRT 29 (L) 37 - 50 %    OXYHEMOGLOBIN 96.5 (H) 90.0 - 95.0 %    CARBOXYHEMOGLOBIN 1.4 <=3.0 %    MET-HEMOGLOBIN 1.2 <=1.5 %    O2CT 13.4 %    SODIUM 145 136 - 145 mmol/L    WHOLE BLOOD POTASSIUM 3.4 (L) 3.5 - 5.1 mmol/L    CHLORIDE 104 98 - 107 mmol/L    IONIZED CALCIUM 1.20 1.15 - 1.33 mmol/L    GLUCOSE 138 (H) 65 - 125 mg/dL    LACTATE 3.9 (H) <=8.0 mmol/L    Narrative    A reference range for Oxygen Saturation is provided but abnormal results will not flag in Epic.   PRODUCT: FFP/PLASMA - UNITS : 1 Units    Collection Time: 07/17/24 11:20 PM   Result Value Ref Range    Coding System [id number]     UNIT NUMBER [id number]     BLOOD COMPONENT TYPE THAWED PLASMA     UNIT DIVISION 00     UNIT DISPENSE STATUS ISSUED     TRANSFUSION STATUS OK TO TRANSFUSE     Product Code [id number]    CBC    Collection Time: 07/17/24 11:56 PM   Result Value Ref Range    WBC 7.2 3.7 - 11.0 x10^3/uL    RBC 3.11 (L) 3.85 - 5.22 x10^6/uL    HGB 8.7 (L) 11.5 - 16.0 g/dL    HCT 73.7 (L) 65.1 - 46.0 %    MCV 84.2 78.0 - 100.0 fL    MCH 28.0 26.0 - 32.0 pg    MCHC 33.2 31.0 - 35.5 g/dL    RDW-CV 84.3 (H) 88.4 - 15.5 %    PLATELETS 50 (L) 150 - 400 x10^3/uL  Narrative    Mean Platelet Volume - Not Reported   COMPREHENSIVE METABOLIC PANEL, NON-FASTING    Collection Time: 07/17/24 11:56 PM   Result Value Ref Range    SODIUM 150 (H) 133 - 144 mmol/L    POTASSIUM 3.6 3.2 - 5.0 mmol/L    CHLORIDE 103 96 - 106 mmol/L    CO2 TOTAL 26 22 - 30 mmol/L    ANION GAP 21 (H) 7 - 18 mmol/L    BUN 61 (H) 8 - 23 mg/dL    CREATININE 8.38 (H) 0.50 - 0.90 mg/dL    ESTIMATED GFR 35 (L) >90 mL/min/1.42m^2    ALBUMIN 2.9 (L) 3.5 - 5.2 g/dL    CALCIUM 9.2 8.3 - 89.2 mg/dL    GLUCOSE 857 (H) 74 - 109 mg/dL    ALKALINE PHOSPHATASE 326 (H) 35 - 129 U/L    ALT (SGPT) 1,146 (H) 0 - 33 U/L    AST (SGOT) 2,963 (H) 0 - 32 U/L    BILIRUBIN TOTAL 6.4 (H) 0.2 - 1.2 mg/dL    PROTEIN TOTAL 5.0 (L) 6.4 - 8.3 g/dL   IONIZED CALCIUM WITH PH     Collection Time: 07/17/24 11:56 PM   Result Value Ref Range    PH (VENOUS) 7.46 (H) 7.32 - 7.43    IONIZED CALCIUM 1.17 1.15 - 1.33 mmol/L   MAGNESIUM     Collection Time: 07/17/24 11:56 PM   Result Value Ref Range    MAGNESIUM  1.9 1.6 - 2.4 mg/dL   PHOSPHORUS    Collection Time: 07/17/24 11:56 PM   Result Value Ref Range    PHOSPHORUS 4.1 2.5 - 4.5 mg/dL   PT/INR    Collection Time: 07/17/24 11:56 PM   Result Value Ref Range    PROTHROMBIN TIME 22.3 (H) 12.1 - 15.3 seconds    INR 1.94 (H) 0.86 - 1.14    Narrative    In the setting of warfarin therapy, a moderate-intensity INR goal range is 2.0 to 3.0 and a high-intensity INR goal range is 2.5 to 3.5.    INR is ONLY validated to determine the level of anticoagulation with vitamin K antagonists (warfarin). Other factors may elevate the INR including but not limited to direct oral anticoagulants (DOACs), liver dysfunction, vitamin K deficiency, DIC, factor deficiencies, and factor inhibitors.   PTT (PARTIAL THROMBOPLASTIN TIME)    Collection Time: 07/17/24 11:56 PM   Result Value Ref Range    APTT 44.8 (H) 22.4 - 34.8 seconds   LACTIC ACID - FIRST REFLEX    Collection Time: 07/17/24 11:56 PM   Result Value Ref Range    LACTIC ACID 5.3 (HH) 0.5 - 2.0 mmol/L   BLOOD GAS W/ CO-OX, LYTES, LACTATE REFLEX Arterial    Collection Time: 07/17/24 11:59 PM   Result Value Ref Range    %FIO2 (ARTERIAL) 40 %    PH (ARTERIAL) 7.44 7.35 - 7.45    PCO2 (ARTERIAL) 42 35 - 45 mm/Hg    PO2 (ARTERIAL) 78 (L) 83 - 108 mm/Hg    BICARBONATE (ARTERIAL) 28.0 21.0 - 28.0 mmol/L    BASE EXCESS (ARTERIAL) 3.9 (H) 0.0 - 3.0 mmol/L    PAO2/FIO2 RATIO 195     O2 SATURATION (ARTERIAL) 95.9 94.0 - 98.0 %    HEMOGLOBIN 9.3 (L) 12.0 - 18.0 g/dL    HEMATOCRITRT 28 (L) 37 - 50 %    OXYHEMOGLOBIN 95.6 (H) 90.0 - 95.0 %    CARBOXYHEMOGLOBIN 1.5 <=3.0 %  MET-HEMOGLOBIN 0.8 <=1.5 %    O2CT 12.6 %    SODIUM 145 136 - 145 mmol/L    WHOLE BLOOD POTASSIUM 3.4 (L) 3.5 - 5.1 mmol/L    CHLORIDE 104 98 - 107 mmol/L     IONIZED CALCIUM 1.19 1.15 - 1.33 mmol/L    GLUCOSE 118 65 - 125 mg/dL    LACTATE 4.7 (HH) <=8.0 mmol/L    Narrative    A reference range for Oxygen Saturation is provided but abnormal results will not flag in Epic.   POC BLOOD GLUCOSE (RESULTS)    Collection Time: 07/18/24 12:34 AM   Result Value Ref Range    GLUCOSE, POC 136 (H) 70 - 100 mg/dl   CBC    Collection Time: 07/18/24  3:35 AM   Result Value Ref Range    WBC 6.2 3.7 - 11.0 x10^3/uL    RBC 2.83 (L) 3.85 - 5.22 x10^6/uL    HGB 8.0 (L) 11.5 - 16.0 g/dL    HCT 75.7 (L) 65.1 - 46.0 %    MCV 85.5 78.0 - 100.0 fL    MCH 28.3 26.0 - 32.0 pg    MCHC 33.1 31.0 - 35.5 g/dL    RDW-CV 84.2 (H) 88.4 - 15.5 %    PLATELETS 36 (L) 150 - 400 x10^3/uL    MPV 11.3 8.7 - 12.5 fL   COMPREHENSIVE METABOLIC PANEL, NON-FASTING    Collection Time: 07/18/24  3:35 AM   Result Value Ref Range    SODIUM 148 (H) 133 - 144 mmol/L    POTASSIUM 4.1 3.2 - 5.0 mmol/L    CHLORIDE 103 96 - 106 mmol/L    CO2 TOTAL 25 22 - 30 mmol/L    ANION GAP 20 (H) 7 - 18 mmol/L    BUN 61 (H) 8 - 23 mg/dL    CREATININE 8.41 (H) 0.50 - 0.90 mg/dL    ESTIMATED GFR 36 (L) >90 mL/min/1.35m^2    ALBUMIN 3.0 (L) 3.5 - 5.2 g/dL    CALCIUM 9.1 8.3 - 89.2 mg/dL    GLUCOSE 859 (H) 74 - 109 mg/dL    ALKALINE PHOSPHATASE 305 (H) 35 - 129 U/L    ALT (SGPT) 1,130 (H) 0 - 33 U/L    AST (SGOT) 2,911 (H) 0 - 32 U/L    BILIRUBIN TOTAL 6.8 (H) 0.2 - 1.2 mg/dL    PROTEIN TOTAL 5.1 (L) 6.4 - 8.3 g/dL   IONIZED CALCIUM WITH PH    Collection Time: 07/18/24  3:35 AM   Result Value Ref Range    PH (VENOUS) 7.42 7.32 - 7.43    IONIZED CALCIUM 1.14 (L) 1.15 - 1.33 mmol/L   MAGNESIUM     Collection Time: 07/18/24  3:35 AM   Result Value Ref Range    MAGNESIUM  2.4 1.6 - 2.4 mg/dL   PHOSPHORUS    Collection Time: 07/18/24  3:35 AM   Result Value Ref Range    PHOSPHORUS 4.3 2.5 - 4.5 mg/dL   PT/INR    Collection Time: 07/18/24  3:35 AM   Result Value Ref Range    PROTHROMBIN TIME 23.8 (H) 12.1 - 15.3 seconds    INR 2.12 (H) 0.86 - 1.14     Narrative    In the setting of warfarin therapy, a moderate-intensity INR goal range is 2.0 to 3.0 and a high-intensity INR goal range is 2.5 to 3.5.    INR is ONLY validated to determine the level of anticoagulation with vitamin K  antagonists (warfarin). Other factors may elevate the INR including but not limited to direct oral anticoagulants (DOACs), liver dysfunction, vitamin K deficiency, DIC, factor deficiencies, and factor inhibitors.   PTT (PARTIAL THROMBOPLASTIN TIME)    Collection Time: 07/18/24  3:35 AM   Result Value Ref Range    APTT 47.0 (H) 22.4 - 34.8 seconds   LACTIC ACID - FIRST REFLEX    Collection Time: 07/18/24  3:35 AM   Result Value Ref Range    LACTIC ACID 5.9 (HH) 0.5 - 2.0 mmol/L   BLOOD GAS W/ CO-OX, LYTES, LACTATE REFLEX Arterial    Collection Time: 07/18/24  3:37 AM   Result Value Ref Range    %FIO2 (ARTERIAL) 40 %    PH (ARTERIAL) 7.40 7.35 - 7.45    PCO2 (ARTERIAL) 44 35 - 45 mm/Hg    PO2 (ARTERIAL) 81 (L) 83 - 108 mm/Hg    BICARBONATE (ARTERIAL) 26.6 21.0 - 28.0 mmol/L    BASE EXCESS (ARTERIAL) 2.2 0.0 - 3.0 mmol/L    PAO2/FIO2 RATIO 203     O2 SATURATION (ARTERIAL) 95.9 94.0 - 98.0 %    HEMOGLOBIN 8.6 (L) 12.0 - 18.0 g/dL    HEMATOCRITRT 26 (L) 37 - 50 %    OXYHEMOGLOBIN 95.1 (H) 90.0 - 95.0 %    CARBOXYHEMOGLOBIN 1.4 <=3.0 %    MET-HEMOGLOBIN 1.1 <=1.5 %    O2CT 11.6 %    SODIUM 143 136 - 145 mmol/L    WHOLE BLOOD POTASSIUM 4.0 3.5 - 5.1 mmol/L    CHLORIDE 105 98 - 107 mmol/L    IONIZED CALCIUM 1.19 1.15 - 1.33 mmol/L    GLUCOSE 135 (H) 65 - 125 mg/dL    LACTATE 5.9 (HH) <=8.0 mmol/L    Narrative    A reference range for Oxygen Saturation is provided but abnormal results will not flag in Epic.   POC BLOOD GLUCOSE (RESULTS)    Collection Time: 07/18/24  4:13 AM   Result Value Ref Range    GLUCOSE, POC 149 (H) 70 - 100 mg/dl   PRODUCT: FFP/PLASMA - UNITS : 3 Units    Collection Time: 07/18/24  4:15 AM   Result Value Ref Range    Coding System [id number]     UNIT NUMBER [id number]      BLOOD COMPONENT TYPE THAWED PLASMA     UNIT DIVISION 00     UNIT DISPENSE STATUS ISSUED     TRANSFUSION STATUS OK TO TRANSFUSE     Product Code [id number]    VANCOMYCIN, RANDOM    Collection Time: 07/18/24  6:21 AM   Result Value Ref Range    VANCOMYCIN RANDOM 8.1 See Comment ug/mL    DOSE DATE 07/18/2024    LACTIC ACID - SECOND REFLEX    Collection Time: 07/18/24  6:21 AM   Result Value Ref Range    LACTIC ACID 4.7 (HH) 0.5 - 2.0 mmol/L   CBC    Collection Time: 07/18/24  6:21 AM   Result Value Ref Range    WBC 6.2 3.7 - 11.0 x10^3/uL    RBC 2.63 (L) 3.85 - 5.22 x10^6/uL    HGB 7.5 (L) 11.5 - 16.0 g/dL    HCT 77.1 (L) 65.1 - 46.0 %    MCV 86.7 78.0 - 100.0 fL    MCH 28.5 26.0 - 32.0 pg    MCHC 32.9 31.0 - 35.5 g/dL    RDW-CV 84.0 (H) 88.4 - 15.5 %    PLATELETS  35 (L) 150 - 400 x10^3/uL    MPV 10.1 8.7 - 12.5 fL   COMPREHENSIVE METABOLIC PANEL, NON-FASTING    Collection Time: 07/18/24  6:21 AM   Result Value Ref Range    SODIUM 149 (H) 133 - 144 mmol/L    POTASSIUM 3.6 3.2 - 5.0 mmol/L    CHLORIDE 101 96 - 106 mmol/L    CO2 TOTAL 27 22 - 30 mmol/L    ANION GAP 21 (H) 7 - 18 mmol/L    BUN 63 (H) 8 - 23 mg/dL    CREATININE 8.39 (H) 0.50 - 0.90 mg/dL    ESTIMATED GFR 35 (L) >90 mL/min/1.55m^2    ALBUMIN 3.1 (L) 3.5 - 5.2 g/dL    CALCIUM 9.1 8.3 - 89.2 mg/dL    GLUCOSE 824 (H) 74 - 109 mg/dL    ALKALINE PHOSPHATASE 294 (H) 35 - 129 U/L    ALT (SGPT) 1,082 (H) 0 - 33 U/L    AST (SGOT) 2,707 (H) 0 - 32 U/L    BILIRUBIN TOTAL 6.6 (H) 0.2 - 1.2 mg/dL    PROTEIN TOTAL 5.3 (L) 6.4 - 8.3 g/dL   IONIZED CALCIUM WITH PH    Collection Time: 07/18/24  6:21 AM   Result Value Ref Range    PH (VENOUS) 7.38 7.32 - 7.43    IONIZED CALCIUM 1.10 (L) 1.15 - 1.33 mmol/L   MAGNESIUM     Collection Time: 07/18/24  6:21 AM   Result Value Ref Range    MAGNESIUM  2.2 1.6 - 2.4 mg/dL   PHOSPHORUS    Collection Time: 07/18/24  6:21 AM   Result Value Ref Range    PHOSPHORUS 4.2 2.5 - 4.5 mg/dL   PT/INR    Collection Time: 07/18/24  6:21 AM   Result  Value Ref Range    PROTHROMBIN TIME 22.6 (H) 12.1 - 15.3 seconds    INR 1.97 (H) 0.86 - 1.14    Narrative    In the setting of warfarin therapy, a moderate-intensity INR goal range is 2.0 to 3.0 and a high-intensity INR goal range is 2.5 to 3.5.    INR is ONLY validated to determine the level of anticoagulation with vitamin K antagonists (warfarin). Other factors may elevate the INR including but not limited to direct oral anticoagulants (DOACs), liver dysfunction, vitamin K deficiency, DIC, factor deficiencies, and factor inhibitors.   PTT (PARTIAL THROMBOPLASTIN TIME)    Collection Time: 07/18/24  6:21 AM   Result Value Ref Range    APTT 46.5 (H) 22.4 - 34.8 seconds   BLOOD GAS W/ CO-OX, LYTES, LACTATE REFLEX Arterial    Collection Time: 07/18/24  6:23 AM   Result Value Ref Range    %FIO2 (ARTERIAL) 40 %    PH (ARTERIAL) 7.39 7.35 - 7.45    PCO2 (ARTERIAL) 49 (H) 35 - 45 mm/Hg    PO2 (ARTERIAL) 95 83 - 108 mm/Hg    BICARBONATE (ARTERIAL) 28.2 (H) 21.0 - 28.0 mmol/L    BASE EXCESS (ARTERIAL) 4.2 (H) 0.0 - 3.0 mmol/L    PAO2/FIO2 RATIO 238     O2 SATURATION (ARTERIAL) 97.3 94.0 - 98.0 %    HEMOGLOBIN 8.0 (L) 12.0 - 18.0 g/dL    HEMATOCRITRT 24 (L) 37 - 50 %    OXYHEMOGLOBIN 97.3 (H) 90.0 - 95.0 %    CARBOXYHEMOGLOBIN 1.4 <=3.0 %    MET-HEMOGLOBIN <0.7 <=1.5 %    O2CT 11.1 %    SODIUM 144 136 - 145  mmol/L    WHOLE BLOOD POTASSIUM 3.5 3.5 - 5.1 mmol/L    CHLORIDE 104 98 - 107 mmol/L    IONIZED CALCIUM 1.17 1.15 - 1.33 mmol/L    GLUCOSE 165 (H) 65 - 125 mg/dL    LACTATE 4.8 (HH) <=8.0 mmol/L    Narrative    A reference range for Oxygen Saturation is provided but abnormal results will not flag in Epic.   COMPREHENSIVE METABOLIC PANEL, NON-FASTING    Collection Time: 07/18/24  8:14 AM   Result Value Ref Range    SODIUM 150 (H) 133 - 144 mmol/L    POTASSIUM 3.5 3.2 - 5.0 mmol/L    CHLORIDE 102 96 - 106 mmol/L    CO2 TOTAL 27 22 - 30 mmol/L    ANION GAP 21 (H) 7 - 18 mmol/L    BUN 64 (H) 8 - 23 mg/dL    CREATININE 8.39 (H)  0.50 - 0.90 mg/dL    ESTIMATED GFR 35 (L) >90 mL/min/1.57m^2    ALBUMIN 3.2 (L) 3.5 - 5.2 g/dL    CALCIUM 9.2 8.3 - 89.2 mg/dL    GLUCOSE 830 (H) 74 - 109 mg/dL    ALKALINE PHOSPHATASE 308 (H) 35 - 129 U/L    ALT (SGPT) 1,053 (H) 0 - 33 U/L    AST (SGOT) 2,598 (H) 0 - 32 U/L    BILIRUBIN TOTAL 6.6 (H) 0.2 - 1.2 mg/dL    PROTEIN TOTAL 5.3 (L) 6.4 - 8.3 g/dL   CBC    Collection Time: 07/18/24  8:14 AM   Result Value Ref Range    WBC 6.2 3.7 - 11.0 x10^3/uL    RBC 2.45 (L) 3.85 - 5.22 x10^6/uL    HGB 6.9 (LL) 11.5 - 16.0 g/dL    HCT 79.2 (L) 65.1 - 46.0 %    MCV 86.5 78.0 - 100.0 fL    MCH 28.2 26.0 - 32.0 pg    MCHC 32.5 31.0 - 35.5 g/dL    RDW-CV 84.0 (H) 88.4 - 15.5 %    PLATELETS 34 (L) 150 - 400 x10^3/uL    MPV 10.3 8.7 - 12.5 fL   MAGNESIUM     Collection Time: 07/18/24  8:14 AM   Result Value Ref Range    MAGNESIUM  2.1 1.6 - 2.4 mg/dL   PHOSPHORUS    Collection Time: 07/18/24  8:14 AM   Result Value Ref Range    PHOSPHORUS 4.3 2.5 - 4.5 mg/dL   COMPREHENSIVE METABOLIC PANEL, NON-FASTING    Collection Time: 07/18/24 11:12 AM   Result Value Ref Range    SODIUM 148 (H) 133 - 144 mmol/L    POTASSIUM 3.6 3.2 - 5.0 mmol/L    CHLORIDE 103 96 - 106 mmol/L    CO2 TOTAL 25 22 - 30 mmol/L    ANION GAP 20 (H) 7 - 18 mmol/L    BUN 62 (H) 8 - 23 mg/dL    CREATININE 8.33 (H) 0.50 - 0.90 mg/dL    ESTIMATED GFR 34 (L) >90 mL/min/1.72m^2    ALBUMIN 3.1 (L) 3.5 - 5.2 g/dL    CALCIUM 8.8 8.3 - 89.2 mg/dL    GLUCOSE 816 (H) 74 - 109 mg/dL    ALKALINE PHOSPHATASE 267 (H) 35 - 129 U/L    ALT (SGPT) 1,047 (H) 0 - 33 U/L    AST (SGOT) 2,562 (H) 0 - 32 U/L    BILIRUBIN TOTAL 7.0 (H) 0.2 - 1.2 mg/dL    PROTEIN TOTAL 5.2 (  L) 6.4 - 8.3 g/dL   IONIZED CALCIUM WITH PH    Collection Time: 07/18/24 11:12 AM   Result Value Ref Range    PH (VENOUS) 7.33 7.32 - 7.43    IONIZED CALCIUM 1.12 (L) 1.15 - 1.33 mmol/L   MAGNESIUM     Collection Time: 07/18/24 11:12 AM   Result Value Ref Range    MAGNESIUM  2.0 1.6 - 2.4 mg/dL   PHOSPHORUS    Collection  Time: 07/18/24 11:12 AM   Result Value Ref Range    PHOSPHORUS 4.6 (H) 2.5 - 4.5 mg/dL   PT/INR    Collection Time: 07/18/24 11:12 AM   Result Value Ref Range    PROTHROMBIN TIME 22.8 (H) 12.1 - 15.3 seconds    INR 2.00 (H) 0.86 - 1.14    Narrative    In the setting of warfarin therapy, a moderate-intensity INR goal range is 2.0 to 3.0 and a high-intensity INR goal range is 2.5 to 3.5.    INR is ONLY validated to determine the level of anticoagulation with vitamin K antagonists (warfarin). Other factors may elevate the INR including but not limited to direct oral anticoagulants (DOACs), liver dysfunction, vitamin K deficiency, DIC, factor deficiencies, and factor inhibitors.   PTT (PARTIAL THROMBOPLASTIN TIME)    Collection Time: 07/18/24 11:12 AM   Result Value Ref Range    APTT 45.9 (H) 22.4 - 34.8 seconds   BLOOD GAS W/ CO-OX, LYTES, LACTATE REFLEX Arterial    Collection Time: 07/18/24 11:13 AM   Result Value Ref Range    %FIO2 (ARTERIAL) 50 %    PH (ARTERIAL) 7.32 (L) 7.35 - 7.45    PCO2 (ARTERIAL) 57 (H) 35 - 45 mm/Hg    PO2 (ARTERIAL) 139 (H) 83 - 108 mm/Hg    BICARBONATE (ARTERIAL) 27.1 21.0 - 28.0 mmol/L    BASE EXCESS (ARTERIAL) 2.7 0.0 - 3.0 mmol/L    PAO2/FIO2 RATIO 278     O2 SATURATION (ARTERIAL) 99.0 94.0 - 98.0 %    HEMOGLOBIN 8.1 (L) 12.0 - 18.0 g/dL    HEMATOCRITRT 24 (L) 37 - 50 %    OXYHEMOGLOBIN 98.0 (H) 90.0 - 95.0 %    CARBOXYHEMOGLOBIN 1.7 <=3.0 %    MET-HEMOGLOBIN <0.7 <=1.5 %    O2CT 11.5 %    SODIUM 144 136 - 145 mmol/L    WHOLE BLOOD POTASSIUM 3.5 3.5 - 5.1 mmol/L    CHLORIDE 106 98 - 107 mmol/L    IONIZED CALCIUM 1.18 1.15 - 1.33 mmol/L    GLUCOSE 176 (H) 65 - 125 mg/dL    LACTATE 5.9 (HH) <=8.0 mmol/L    Narrative    A reference range for Oxygen Saturation is provided but abnormal results will not flag in Epic.   CBC    Collection Time: 07/18/24  2:43 PM   Result Value Ref Range    WBC 6.5 3.7 - 11.0 x10^3/uL    RBC 3.19 (L) 3.85 - 5.22 x10^6/uL    HGB 9.2 (L) 11.5 - 16.0 g/dL    HCT  71.6 (L) 65.1 - 46.0 %    MCV 88.7 78.0 - 100.0 fL    MCH 28.8 26.0 - 32.0 pg    MCHC 32.5 31.0 - 35.5 g/dL    RDW-CV 83.4 (H) 88.4 - 15.5 %    PLATELETS 34 (L) 150 - 400 x10^3/uL    Narrative    Mean Platelet Volume - Not Reported   IONIZED CALCIUM WITH PH  Collection Time: 07/18/24  2:43 PM   Result Value Ref Range    PH (VENOUS) 7.36 7.32 - 7.43    IONIZED CALCIUM 1.13 (L) 1.15 - 1.33 mmol/L   PT/INR    Collection Time: 07/18/24  2:43 PM   Result Value Ref Range    PROTHROMBIN TIME 22.3 (H) 12.1 - 15.3 seconds    INR 1.94 (H) 0.86 - 1.14    Narrative    In the setting of warfarin therapy, a moderate-intensity INR goal range is 2.0 to 3.0 and a high-intensity INR goal range is 2.5 to 3.5.    INR is ONLY validated to determine the level of anticoagulation with vitamin K antagonists (warfarin). Other factors may elevate the INR including but not limited to direct oral anticoagulants (DOACs), liver dysfunction, vitamin K deficiency, DIC, factor deficiencies, and factor inhibitors.   PTT (PARTIAL THROMBOPLASTIN TIME)    Collection Time: 07/18/24  2:43 PM   Result Value Ref Range    APTT 46.1 (H) 22.4 - 34.8 seconds   BLOOD GAS W/ CO-OX, LYTES, LACTATE REFLEX Arterial    Collection Time: 07/18/24  2:44 PM   Result Value Ref Range    %FIO2 (ARTERIAL) 45 %    PH (ARTERIAL) 7.33 (L) 7.35 - 7.45    PCO2 (ARTERIAL) 57 (H) 35 - 45 mm/Hg    PO2 (ARTERIAL) 74 (L) 83 - 108 mm/Hg    BICARBONATE (ARTERIAL) 27.4 21.0 - 28.0 mmol/L    BASE EXCESS (ARTERIAL) 3.2 (H) 0.0 - 3.0 mmol/L    PAO2/FIO2 RATIO 164     O2 SATURATION (ARTERIAL) 93.6 94.0 - 98.0 %    HEMOGLOBIN 10.2 (L) 12.0 - 18.0 g/dL    HEMATOCRITRT 31 (L) 37 - 50 %    OXYHEMOGLOBIN 94.5 90.0 - 95.0 %    CARBOXYHEMOGLOBIN 1.7 <=3.0 %    MET-HEMOGLOBIN <0.7 <=1.5 %    O2CT 13.6 %    SODIUM 143 136 - 145 mmol/L    WHOLE BLOOD POTASSIUM 3.5 3.5 - 5.1 mmol/L    CHLORIDE 102 98 - 107 mmol/L    IONIZED CALCIUM 1.15 1.15 - 1.33 mmol/L    GLUCOSE 169 (H) 65 - 125 mg/dL    LACTATE  3.5 (H) <=8.0 mmol/L    Narrative    A reference range for Oxygen Saturation is provided but abnormal results will not flag in Epic.            Imaging Studies:     Last Echo  Results for orders placed during the hospital encounter of 07/08/24    TRANSTHORACIC ECHOCARDIOGRAM - ADULT 07/09/2024  8:05 AM    Narrative  **See full report in linked PDF document**  [facility]  [address] [place], [place], [place] [address]    Transthoracic Echocardiographic Report    ______________________________________________________________________________  Name: ELLENIE, SALOME                              MRN: [id number]                Weight: 137 lb  Study Date: 07/09/2024 07:48 AM                      DOB: 04-27-1956             Height: 60.5 in  Gender: Female  Age: 37 yrs                 BSA: 1.6 m2  Accession #: [id number]                           BP: 154/90 mmHg  Patient Location: [facility]ARD[facility]  Ordering Provider: CHERI ESTELL SAUNDERS  Tech: Sonny Fell    ______________________________________________________________________________  Procedure:  Transthoracic complete echo with contrast, 2D, spectral and tissue Doppler, color flow Doppler, M-mode.    Quality:  The study images were of technically adequate quality.    Indications: Pulmonary HTN (CMS HCC),Heart failure    Conclusions:  The left ventricular ejection fraction by visual assessment is estimated to be 55-60%.    Findings  Left Ventricle:   Normal wall thickness. Normal left ventricular size. The left ventricular ejection fraction by visual assessment is estimated to be 55-60%. Left ventricular systolic function is  normal. No segmental/regional wall motion abnormalities identified. Grade I Diastolic dysfunction present with normal left atrial pressure.  Right Ventricle:   Normal right ventricular size. Right ventricular systolic function cannot be assessed. Right ventricular systolic  pressure is normal.  Left Atrium:   The left atrium is normal in size.  Right Atrium:   The right atrium is of normal size.  Mitral Valve:   The mitral valve is normal. No evidence of mitral stenosis. There is mild mitral regurgitation.  Tricuspid Valve:   The tricuspid valve is normal. There is mild tricuspid regurgitation. There is no evidence of tricuspid stenosis.  Aortic Valve:   The aortic valve is normal. No Aortic valve stenosis. No significant aortic regurgitation present.  Pulmonic Valve:   There is mild pulmonic regurgitation. There is no evidence of pulmonic valve stenosis.  IVC/Hepatic Veins:   Normal IVC size with >50% inspiratory collapse (estimated RA pressure 3 mmHg).  Aorta:   The aortic root is of normal size.  Pericardium/Pleural space:   Normal pericardium with no pericardial effusion.    Electronically signed by: MD Jayson Rives on 07/09/2024 11:30 AM         STRESS TEST/MYOCARDIAL PERFUSION IMAGING  No results found for this or any previous visit.      CT BRAIN WO IV CONTRAST  Result Date: 07/16/2024  Impression Stable exam . . . s/d/g Radiologist location ID: [id number]     CT BRAIN WO IV CONTRAST  Result Date: 07/15/2024  Impression Small volume of acute hemorrhage in the subdural space overlying the left frontal lobe anteriorly and superiorly as detailed above. No significant change otherwise seen. . . . s/d/g A Critical Red actionable finding has been sent via the [employer] Actionable Findings application on 07/15/2024 8:26 AM, Message ID [id number]. Receipt of this communication will be communicated to [facility] RADIOLOGY STAFF or responsible provider and will be documented in [employer] Actionable Findings System upon receiving the acknowledgement. A Critical Red actionable finding has been sent via the [employer] Actionable Findings application on 07/15/2024 8:26 AM, Message ID [id number]. Receipt of this communication will be communicated to [facility] RADIOLOGY STAFF or responsible  provider and will be documented in [employer] Actionable Findings System upon receiving the acknowledgement. Radiologist location ID: [id number]     CT ABDOMEN PELVIS WO IV CONTRAST  Result Date: 07/13/2024  Impression 1. Grossly stable hematoma in the gallbladder fossa with slight decrease in volume of hemoperitoneum. 2. Additional stable findings as above. Radiologist location ID: [id number]  CT ABDOMEN PELVIS WO IV CONTRAST  Result Date: 07/12/2024  Impression 1. Hematoma associated with the gallbladder fossa with fluid tracking inferiorly. 2. Moderate volume hemoperitoneum, more so blood lying in the dependent pelvis, though also around the liver and spleen and pericolic gutters. 3. Other findings as described above. Radiologist location ID: [id number]     MRI MRCP WO CONTRAST  Result Date: 07/10/2024  Impression * Mild extra hepatic bile duct dilatation as noted on CT 07/08/2024, no bile duct stone. * No gallstones, there is pericholecystic fluid as well as fluid adjacent to the liver. * Diffuse heterogeneous appearance to the liver as described above, this could certainly represent findings associated with acute hepatitis, differential considerations include regenerative nodularity in chronic liver disease, metastatic disease/lymphoma, and possibly sarcoid Radiologist location ID: [id number] Radiologist location ID: [id number]     US  RT UPPER QUADRANT  Result Date: 07/09/2024  Impression Gallbladder wall thickening with pericholecystic fluid consistent with acute cholecystitis. Dilated common bile duct without distal obstructing lesion identified. Hepatomegaly with hepatic steatosis. Trace ascites. Radiologist location ID: [id number]     CT ANGIO CHEST FOR PULMONARY EMBOLUS W IV CONTRAST  Result Date: 07/08/2024  Impression 1. No evidence of pulmonary embolism. 2. Marginal increase in the size of the right infrahilar mass, right hilar and mediastinal lymphadenopathy compared to the prior study.  3. Other chronic findings as described above. Radiologist location ID: [id number]     CT ABDOMEN PELVIS W IV CONTRAST  Result Date: 07/08/2024  Impression 1. Hepatomegaly, hepatic steatosis and hepatic lesions, not greatly changed and new pericholecystic fluid now present. 2. Multiple nonobstructing bilateral renal calculi. 3. New free fluid in the pelvis. Radiologist location ID: [id number]     XR AP MOBILE CHEST  Result Date: 07/08/2024  Impression 1. Right hilar adenopathy. 2. Vague pulmonary opacities could be mild pneumonia or atelectasis. Radiologist location ID: [id number]     XR AP MOBILE CHEST  Result Date: 06/17/2024  Impression 1. No pneumothorax post bronchoscopy. 2. Right hilar mass. 3. Opacity right lung apex and right lung base, may be aspiration or atelectasis. Radiologist location ID: [id number]          Inpatient Meds:  albuterol  (PROVENTIL ) 2.5 mg / 3 mL (0.083%) neb solution, 2.5 mg, Nebulization, 4x/day  aluminum-magnesium  hydroxide-simethicone (MAG-AL PLUS) 200-200-20 mg per 5 mL oral liquid, 30 mL, Oral, Q4H PRN  amiodarone  (NEXTERONE ) 360 mg in D5W 200mL premix infusion, 0.5 mg/min, Intravenous, Continuous  amiodarone  in D5W (NEXTERONE ) 150 mg/100 mL (1.5 mg/mL) premix infusion ---Cabinet Override, , ,   anastrozole  (ARIMIDEX ) tablet, 1 mg, Oral, Daily  budesonide (PULMICORT RESPULES) 0.5 mg/2 mL nebulizer suspension, 1 mg, Nebulization, 2x/day   And  arformoterol (BROVANA) 15 mcg/2 mL nebulizer solution, 15 mcg, Nebulization, 2x/day  [Held by provider] atorvastatin  (LIPITOR) tablet, 40 mg, Oral, Daily  chlorhexidine gluconate (PERIDEX) 0.12% mouthwash, 15 mL, Swish & Spit, 2x/day  cholecalciferol  (VITAMIN D3) 1000 unit (25 mcg) tablet, 1,000 Units, Oral, Daily  [Held by provider] clonazePAM  (klonoPIN ) tablet, 0.5 mg, Oral, 2x/day PRN  Correction/SSIP insulin  lispro 100 units/mL injection, 1-5 Units, Subcutaneous, Q4HRS  NS 250 mL flush bag, , Intravenous, Q15 Min PRN   And  D5W 250 mL  flush bag, , Intravenous, Q15 Min PRN  dexmedeTOMIDine  (PRECEDEX ) 400 mcg in D5W 100 mL premix infusion, 0-1.5 mcg/kg/hr (Adjusted), Intravenous, Continuous  dextrose  (GLUTOSE) 40% oral gel, 15 g, Oral, Q15 Min PRN  dextrose  50% (0.5 g/mL) injection - syringe, 12.5 g, Intravenous, Q15 Min  PRN  DULoxetine  (CYMBALTA ) delayed release capsule, 60 mg, Oral, Daily  EPINEPHrine  (ADRENALIN ) 10 mg in NS 250 mL infusion, 0-0.3 mcg/kg/min, Intravenous, Continuous  fentaNYL (SUBLIMAZE) 50 mcg/mL injection, 100 mcg, Intravenous, Once  fentaNYL (SUBLIMAZE) 50 mcg/mL injection, 50 mcg, Intravenous, Q15 Min PRN  fentaNYL (SUBLIMAZE) 50 mcg/mL IV infusion, 0-3 mcg/kg/hr (Adjusted), Intravenous, Continuous  folic acid  (FOLVITE ) tablet, 1 mg, Oral, Daily  furosemide  (LASIX ) 10 mg/mL injection, 40 mg, Intravenous, 2x/day  glucagon  (GLUCAGEN) injection 1 mg, 1 mg, IntraMUSCULAR, Once PRN  hydrocortisone (solu-CORTEF) 50 mg/mL injection, 50 mg, Intravenous, Q6H  [Held by provider] HYDROmorphone  (DILAUDID ) 0.5 mg/0.5 mL injection, 0.5 mg, Intravenous, Q4H PRN  [Held by provider] losartan  (COZAAR ) tablet, 25 mg, Oral, Daily  [Held by provider] metoprolol  tartrate (LOPRESSOR ) tablet, 25 mg, Oral, 2x/day  midazolam (VERSED) 1 mg/mL injection, 2 mg, Intravenous, Q1H PRN  multivitamin-minerals-iron  oral liquid, 15 mL, Oral, Daily  norepinephrine  (LEVOPHED ) 4 mg in D5W 250 mL premix infusion (16 mcg/mL), 0-0.3 mcg/kg/min, Intravenous, Continuous  NS bolus infusion 40 mL, 40 mL, Intravenous, Once PRN  NS bolus infusion 40 mL, 40 mL, Intravenous, Once PRN  NS flush syringe, 3 mL, Intracatheter, Q8HRS  NS flush syringe, 3 mL, Intracatheter, Q1H PRN  [Held by provider] OLANZapine  (zyPREXA ) tablet, 5 mg, Oral, NIGHTLY  omega-3 fatty acids (LOVAZA ) capsule, 1 g, Oral, Daily  pantoprazole  (PROTONIX ) 4 mg/mL injection, 40 mg, Intravenous, Q12H  phenylephrine  (NEO-SYNEPHRINE) 100 mg in NS 250 mL infusion, 0-5 mcg/kg/min, Intravenous,  Continuous  piperacillin-tazobactam (ZOSYN) 4.5 g in iso-osmotic 100 mL premix IVPB, 4.5 g, Intravenous, Q8H  polyethylene glycol (MIRALAX) oral packet, 17 g, Oral, Daily  Vancomycin IV - Pharmacist to Dose per Protocol, , Does not apply, Daily PRN  Vancomycin IV Intermittent Dosing, , Does not apply, Daily PRN  vasopressin (VASOSTRICT) 20 Units in NS 100 mL (0.2 units/mL) infusion, 0.03 Units/min, Intravenous, Continuous       Allergies[1]    Telemetry:  Atrial fibrillation RVR and converted to sinus rhythm    Assessment/Plan:  Active Hospital Problems    Diagnosis    Primary Problem: Acute hypoxic respiratory failure (CMS HCC)    Moderate protein-calorie malnutrition (CMS HCC)    Elevated LFTs    Hemoperitoneum    Bile leak    Subdural hematoma (CMS HCC)    NSVT (nonsustained ventricular tachycardia) (CMS HCC)    Heart failure, unspecified HF chronicity, unspecified heart failure type (CMS HCC)    Acute cholecystitis    Thickening of wall of gallbladder with pericholecystic fluid    Symptomatic anemia    COVID-19    Acute blood loss anemia    Transaminitis    Malignant neoplasm of lung, unspecified laterality, unspecified part of lung (CMS HCC)    Pulmonary HTN (CMS HCC)    Pneumonia due to COVID-19 virus    Mood disorder (CMS HCC)    Anemia, unspecified type    Cigarette smoker    Metastasis to liver    History of breast cancer    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  Continue for now.  Currently in sinus rhythm.    No anticoagulation secondary to severe anemia and thrombocytopenia.      Retta Ester, PA-C              [  1] No Known Allergies

## 2024-07-18 NOTE — Consults (Signed)
 [facility] MEDICINE [facility]  [address]  [place] [address]     Consult  Follow Up Note    Joshlyn, Beadle, 68 y.o. female  Date of Birth:  04/03/1956  Encounter Start Date:  07/08/2024  Inpatient Admission Date: 07/08/2024   Date of service: 07/18/24     Requesting MD: No ref. provider found     Reason for consultation:  Acute cholecystitis     HPI:  Bryli Mantey is a 68 y.o. female who was sent to the emergency department from infusion Center where she had presented to start chemotherapy.  She has a recent diagnosis of small cell carcinoma with liver Mets.  She complained of dyspnea and hypoxia and was diagnosed with COVID.  CT abdomen and pelvis with findings of hepatomegaly, hepatic steatosis and hepatic lesions, new pericholecystic fluid.  General surgery consulted for evaluation.     Subjective:  Patient remains in intensive care unit, family at bedside.  [name] still continues to be intubated and mildly sedated.  She has only received FFP 4 units in the last 24 hours.  No further units of blood were needed.  She still requires some vasopressor support from time to time.  At the time we were examined and her patient had runs of atrial fibrillation.    Abdomen distended but still soft.    Historical Data   Past Medical History:   Diagnosis Date    Abdominal pain     Cancer (CMS HCC)     Chest pain     Chronic lung disease     Coronary artery disease     Dyslipidemia     Essential hypertension     Heart disease     History of percutaneous coronary intervention     History of stress test     Mixed hyperlipidemia     Unspecified chronic bronchitis          Past Surgical History:   Procedure Laterality Date    CARDIAC CATHETERIZATION      CORONARY ANGIOPLASTY      CORONARY ARTERY STENT PLACEMENT      HX BACK SURGERY      HX CORONARY ARTERY BYPASS GRAFT      HX MASTECTOMY, SIMPLE Bilateral     HX TUBAL LIGATION      NECK SURGERY           Allergies[1]    Social  History  Social History[2]         Current Outpatient Medications   Medication Instructions    anastrozole  (ARIMIDEX ) 1 mg, Daily    aspirin  81 mg, Oral, Daily    atorvastatin  (LIPITOR) 40 mg, Oral, Daily    cholecalciferol  (vitamin D3) 1,000 Units, Daily    clonazePAM  (KLONOPIN ) 0.5 mg, 2 TIMES DAILY PRN    DULoxetine  (CYMBALTA  DR) 60 mg, Daily    fluticasone  propion-salmeteroL (ADVAIR ) 250-50 mcg/dose Inhalation oral diskus inhaler 1 Inhalation, Inhalation, 2 TIMES DAILY    fluticasone  propionate (FLONASE ) 50 mcg/actuation Nasal Spray, Suspension 1 Spray, DAILY PRN    gabapentin  (NEURONTIN ) 400 mg, Oral, 3 TIMES DAILY    losartan  (COZAAR ) 25 mg, Oral, Daily    metoprolol  tartrate (LOPRESSOR ) 25 mg, 2 TIMES DAILY    oxyCODONE -acetaminophen  (PERCOCET) 7.5-325 mg Oral Tablet 1 Tablet, Oral, EVERY 4 HOURS PRN    prochlorperazine  (COMPAZINE ) 10 mg, Oral, 3 TIMES DAILY PRN    promethazine  (PHENERGAN ) 25 mg, Oral, EVERY 8 HOURS  PRN    traZODone  (DESYREL ) 150 mg, NIGHTLY        albuterol  (PROVENTIL ) 2.5 mg / 3 mL (0.083%) neb solution, 2.5 mg, Nebulization, 4x/day  aluminum-magnesium  hydroxide-simethicone (MAG-AL PLUS) 200-200-20 mg per 5 mL oral liquid, 30 mL, Oral, Q4H PRN  anastrozole  (ARIMIDEX ) tablet, 1 mg, Oral, Daily  budesonide (PULMICORT RESPULES) 0.5 mg/2 mL nebulizer suspension, 1 mg, Nebulization, 2x/day   And  arformoterol (BROVANA) 15 mcg/2 mL nebulizer solution, 15 mcg, Nebulization, 2x/day  [Held by provider] atorvastatin  (LIPITOR) tablet, 40 mg, Oral, Daily  chlorhexidine gluconate (PERIDEX) 0.12% mouthwash, 15 mL, Swish & Spit, 2x/day  cholecalciferol  (VITAMIN D3) 1000 unit (25 mcg) tablet, 1,000 Units, Oral, Daily  [Held by provider] clonazePAM  (klonoPIN ) tablet, 0.5 mg, Oral, 2x/day PRN  Correction/SSIP insulin  lispro 100 units/mL injection, 1-5 Units, Subcutaneous, Q4HRS  NS 250 mL flush bag, , Intravenous, Q15 Min PRN   And  D5W 250 mL flush bag, , Intravenous, Q15 Min PRN  dexmedeTOMIDine  (PRECEDEX )  400 mcg in D5W 100 mL premix infusion, 0-1.5 mcg/kg/hr (Adjusted), Intravenous, Continuous  dextrose  (GLUTOSE) 40% oral gel, 15 g, Oral, Q15 Min PRN  dextrose  50% (0.5 g/mL) injection - syringe, 12.5 g, Intravenous, Q15 Min PRN  DULoxetine  (CYMBALTA ) delayed release capsule, 60 mg, Oral, Daily  EPINEPHrine  (ADRENALIN ) 10 mg in NS 250 mL infusion, 0-0.3 mcg/kg/min, Intravenous, Continuous  fentaNYL (SUBLIMAZE) 50 mcg/mL injection, 100 mcg, Intravenous, Once  fentaNYL (SUBLIMAZE) 50 mcg/mL injection, 50 mcg, Intravenous, Q15 Min PRN  fentaNYL (SUBLIMAZE) 50 mcg/mL IV infusion, 0-3 mcg/kg/hr (Adjusted), Intravenous, Continuous  folic acid  (FOLVITE ) tablet, 1 mg, Oral, Daily  glucagon  (GLUCAGEN) injection 1 mg, 1 mg, IntraMUSCULAR, Once PRN  hydrocortisone (solu-CORTEF) 50 mg/mL injection, 50 mg, Intravenous, Q6H  [Held by provider] HYDROmorphone  (DILAUDID ) 0.5 mg/0.5 mL injection, 0.5 mg, Intravenous, Q4H PRN  [Held by provider] losartan  (COZAAR ) tablet, 25 mg, Oral, Daily  [Held by provider] metoprolol  tartrate (LOPRESSOR ) tablet, 25 mg, Oral, 2x/day  midazolam (VERSED) 1 mg/mL injection, 2 mg, Intravenous, Q1H PRN  multivitamin-minerals-iron  oral liquid, 15 mL, Oral, Daily  norepinephrine  (LEVOPHED ) 4 mg in D5W 250 mL premix infusion (16 mcg/mL), 0-0.3 mcg/kg/min, Intravenous, Continuous  NS flush syringe, 3 mL, Intracatheter, Q8HRS  NS flush syringe, 3 mL, Intracatheter, Q1H PRN  [Held by provider] OLANZapine  (zyPREXA ) tablet, 5 mg, Oral, NIGHTLY  omega-3 fatty acids (LOVAZA ) capsule, 1 g, Oral, Daily  pantoprazole  (PROTONIX ) 4 mg/mL injection, 40 mg, Intravenous, Q12H  phenylephrine  (NEO-SYNEPHRINE) 100 mg in NS 250 mL infusion, 0-5 mcg/kg/min, Intravenous, Continuous  piperacillin-tazobactam (ZOSYN) 4.5 g in iso-osmotic 100 mL premix IVPB, 4.5 g, Intravenous, Q8H  polyethylene glycol (MIRALAX) oral packet, 17 g, Oral, Daily  Vancomycin IV - Pharmacist to Dose per Protocol, , Does not apply, Daily PRN  Vancomycin  IV Intermittent Dosing, , Does not apply, Daily PRN  vasopressin (VASOSTRICT) 20 Units in NS 100 mL (0.2 units/mL) infusion, 0.03 Units/min, Intravenous, Continuous        Review of Systems:   All pertinent review of systems as addressed and detailed in HPI above    Vital Signs:  Patient Vitals for the past 24 hrs:   BP Temp Pulse Resp SpO2 Weight   07/18/24 0715 -- -- 80 13 100 % --   07/18/24 0710 -- -- 79 17 98 % --   07/18/24 0705 -- -- 79 (!) 9 98 % --   07/18/24 0700 -- -- 78 (!) 11 100 % --   07/18/24 0645 -- -- 76 ROLLEN)  10 98 % --   07/18/24 0630 -- -- 76 (!) 10 96 % --   07/18/24 0615 -- -- 74 19 (!) 88 % --   07/18/24 0600 -- -- 77 13 92 % 74.9 kg (165 lb 2 oz)   07/18/24 0545 -- -- 76 16 91 % --   07/18/24 0530 -- -- 77 18 93 % --   07/18/24 0515 -- -- 75 (!) 23 93 % --   07/18/24 0500 -- -- 75 13 93 % --   07/18/24 0445 -- -- 75 12 94 % --   07/18/24 0430 -- -- 75 15 93 % --   07/18/24 0426 -- -- 75 -- 93 % --   07/18/24 0415 -- -- 76 19 93 % --   07/18/24 0400 -- -- 76 (!) 23 94 % --   07/18/24 0345 -- -- 77 16 94 % --   07/18/24 0330 -- -- 75 17 95 % --   07/18/24 0315 -- -- 76 (!) 10 94 % --   07/18/24 0300 -- (!) 38.3 C (100.9 F) 79 15 93 % --   07/18/24 0245 -- -- 80 (!) 21 93 % --   07/18/24 0230 -- -- 80 (!) 22 93 % --   07/18/24 0215 -- -- 80 (!) 24 93 % --   07/18/24 0200 -- -- 80 (!) 24 93 % --   07/18/24 0145 -- -- 80 (!) 25 93 % --   07/18/24 0130 -- -- 81 19 94 % --   07/18/24 0115 -- -- 79 19 94 % --   07/18/24 0109 -- -- 80 -- 93 % --   07/18/24 0100 (!) 127/53 (!) 38.1 C (100.6 F) 79 (!) 24 94 % --   07/18/24 0045 -- -- 80 19 93 % --   07/18/24 0030 -- -- 80 (!) 25 94 % --   07/18/24 0015 -- -- 79 17 94 % --   07/18/24 0000 -- -- 79 18 96 % --   07/17/24 2345 -- -- 80 18 95 % --   07/17/24 2330 -- -- 80 18 95 % --   07/17/24 2315 -- -- 79 18 95 % --   07/17/24 2300 -- -- 79 18 95 % --   07/17/24 2245 -- -- 80 (!) 24 94 % --   07/17/24 2231 -- -- 81 -- 94 % --   07/17/24 2230 -- -- 81  (!) 21 95 % --   07/17/24 2215 -- -- 81 19 98 % --   07/17/24 2200 -- -- 80 (!) 24 94 % --   07/17/24 2145 -- -- 80 (!) 24 98 % --   07/17/24 2130 -- -- 80 (!) 24 98 % --   07/17/24 2115 -- -- 81 17 97 % --   07/17/24 2100 -- -- 80 14 97 % --   07/17/24 2045 -- -- 81 (!) 24 97 % --   07/17/24 2030 -- -- 81 (!) 24 97 % --   07/17/24 2015 -- -- 81 14 96 % --   07/17/24 2000 -- -- 79 (!) 24 99 % --   07/17/24 1952 -- -- 81 -- 95 % --   07/17/24 1945 -- -- 81 (!) 24 96 % --   07/17/24 1930 -- -- 81 (!) 24 96 % --   07/17/24 1915 -- -- 81 (!) 24 96 % --   07/17/24 1900 -- 36.6 C (97.9  F) 80 16 96 % --   07/17/24 1845 -- -- 79 16 95 % --   07/17/24 1815 -- -- 82 14 95 % --   07/17/24 1800 -- -- 81 14 95 % --   07/17/24 1745 -- -- 81 14 95 % --   07/17/24 1730 -- -- 81 14 95 % --   07/17/24 1715 -- -- 82 12 95 % --   07/17/24 1700 -- -- 82 14 90 % --   07/17/24 1645 -- -- 83 12 91 % --   07/17/24 1630 -- -- 84 16 92 % --   07/17/24 1615 -- -- 84 14 93 % --   07/17/24 1602 -- -- 83 15 98 % --   07/17/24 1600 -- -- 83 16 98 % --   07/17/24 1545 -- -- 82 15 93 % --   07/17/24 1542 -- -- 83 -- 93 % --   07/17/24 1530 -- -- 83 13 93 % --   07/17/24 1515 -- -- 82 12 93 % --   07/17/24 1500 -- -- 82 15 93 % --   07/17/24 1445 -- -- 83 12 92 % --   07/17/24 1430 -- -- 80 17 92 % --   07/17/24 1415 -- -- 83 13 91 % --   07/17/24 1400 -- -- 83 12 91 % --   07/17/24 1355 -- -- 84 12 91 % --   07/17/24 1350 -- -- 83 12 91 % --   07/17/24 1345 -- -- 83 13 92 % --   07/17/24 1330 -- -- 84 13 93 % --   07/17/24 1315 -- -- 84 13 96 % --   07/17/24 1300 -- -- 84 12 95 % --   07/17/24 1245 -- -- 84 13 96 % --   07/17/24 1240 -- -- 84 12 96 % --   07/17/24 1235 -- -- 84 12 95 % --   07/17/24 1230 -- -- 84 12 96 % --   07/17/24 1215 -- -- 84 12 96 % --   07/17/24 1212 -- -- 84 12 96 % --   07/17/24 1200 -- -- 83 12 97 % --   07/17/24 1145 -- -- 83 12 100 % --   07/17/24 1130 -- -- 82 13 98 % --   07/17/24 1115 -- -- 83 13 97 % --   07/17/24  1100 -- -- 83 12 97 % --   07/17/24 1045 -- -- 84 13 97 % --   07/17/24 1030 -- -- 84 12 97 % --   07/17/24 1015 -- -- 85 13 97 % --   07/17/24 1000 -- -- 84 12 98 % --   07/17/24 0945 -- -- 86 12 97 % --   07/17/24 0930 -- -- 85 14 98 % --   07/17/24 0915 -- -- 85 16 98 % --   07/17/24 0900 -- -- 88 14 98 % --   07/17/24 0845 -- -- 88 13 99 % --   07/17/24 0830 -- -- 88 14 98 % --   07/17/24 0815 -- -- 85 12 100 % --   07/17/24 0800 -- -- 87 13 100 % --   07/17/24 0745 -- -- 87 19 96 % --        Physical Exam:  Physical Exam  Constitutional:       General: She is not in acute distress.     Comments:  Mechanically ventilated and sedated   Eyes:      General: Scleral icterus present.   Cardiovascular:      Rate and Rhythm: Normal rate.   Pulmonary:      Comments: Mechanically ventilated  Abdominal:      Comments: Abdomen is soft, distended, with very hypoactive bowel sounds.    Skin:     General: Skin is warm and dry.      Comments: No further bleeding noted from right lower quadrant trocar site, sutures clean dry and intact   Neurological:      Comments: Sedated     Studies:  I have reviewed all available studies within the electronic medical record.    Results for orders placed or performed during the hospital encounter of 07/08/24 (from the past 24 hours)   TRANSTHORACIC ECHOCARDIOGRAM - ADULT   Result Value Ref Range    EF VISUAL ESTIMATE 55     EF 60    POC BLOOD GLUCOSE (RESULTS)   Result Value Ref Range    GLUCOSE, POC 294 (H) 70 - 100 mg/dl   CBC   Result Value Ref Range    WBC 6.9 3.7 - 11.0 x10^3/uL    RBC 2.45 (L) 3.85 - 5.22 x10^6/uL    HGB 7.1 (L) 11.5 - 16.0 g/dL    HCT 78.4 (L) 65.1 - 46.0 %    MCV 87.8 78.0 - 100.0 fL    MCH 29.0 26.0 - 32.0 pg    MCHC 33.0 31.0 - 35.5 g/dL    RDW-CV 84.9 88.4 - 84.4 %    PLATELETS 42 (L) 150 - 400 x10^3/uL    MPV 10.8 8.7 - 12.5 fL   COMPREHENSIVE METABOLIC PANEL, NON-FASTING   Result Value Ref Range    SODIUM 149 (H) 133 - 144 mmol/L    POTASSIUM 3.3 3.2 - 5.0 mmol/L     CHLORIDE 103 96 - 106 mmol/L    CO2 TOTAL 27 22 - 30 mmol/L    ANION GAP 19 (H) 7 - 18 mmol/L    BUN 59 (H) 8 - 23 mg/dL    CREATININE 8.44 (H) 0.50 - 0.90 mg/dL    ESTIMATED GFR 36 (L) >90 mL/min/1.41m^2    ALBUMIN 3.1 (L) 3.5 - 5.2 g/dL    CALCIUM 9.4 8.3 - 89.2 mg/dL    GLUCOSE 793 (H) 74 - 109 mg/dL    ALKALINE PHOSPHATASE 310 (H) 35 - 129 U/L    ALT (SGPT) 1,025 (H) 0 - 33 U/L    AST (SGOT) 2,418 (H) 0 - 32 U/L    BILIRUBIN TOTAL 5.6 (H) 0.2 - 1.2 mg/dL    PROTEIN TOTAL 5.2 (L) 6.4 - 8.3 g/dL   IONIZED CALCIUM WITH PH   Result Value Ref Range    PH (VENOUS) 7.46 (H) 7.32 - 7.43    IONIZED CALCIUM 1.19 1.15 - 1.33 mmol/L   MAGNESIUM    Result Value Ref Range    MAGNESIUM  1.9 1.6 - 2.4 mg/dL   PHOSPHORUS   Result Value Ref Range    PHOSPHORUS 4.5 2.5 - 4.5 mg/dL   PT/INR   Result Value Ref Range    PROTHROMBIN TIME 20.8 (H) 12.1 - 15.3 seconds    INR 1.77 (H) 0.86 - 1.14   PTT (PARTIAL THROMBOPLASTIN TIME)   Result Value Ref Range    APTT 38.7 (H) 22.4 - 34.8 seconds   FIBRINOGEN   Result Value Ref Range    FIBRINOGEN 305 251 -  576 mg/dL   TEG, RAPID GLOBAL WITH LYSIS (TRAUMA)   Result Value Ref Range    R (CK) >17.0 (H) 4.6 - 9.1 min    LYS30 (%) 0.0 0.0 - 2.6 %    MA (CRT RAPID) 47.3 (L) 52.0 - 70.0 mm    MA FIBRINOGEN (CFF) 19.1 15.0 - 32.0 mm   BLOOD GAS W/ CO-OX, LYTES, LACTATE REFLEX Arterial   Result Value Ref Range    %FIO2 (ARTERIAL) 50 %    PH (ARTERIAL) 7.41 7.35 - 7.45    PCO2 (ARTERIAL) 44 35 - 45 mm/Hg    PO2 (ARTERIAL) 107 83 - 108 mm/Hg    BICARBONATE (ARTERIAL) 27.2 21.0 - 28.0 mmol/L    BASE EXCESS (ARTERIAL) 2.9 0.0 - 3.0 mmol/L    PAO2/FIO2 RATIO 214     O2 SATURATION (ARTERIAL) 98.2 94.0 - 98.0 %    HEMOGLOBIN 9.0 (L) 12.0 - 18.0 g/dL    HEMATOCRITRT 27 (L) 37 - 50 %    OXYHEMOGLOBIN 97.9 (H) 90.0 - 95.0 %    CARBOXYHEMOGLOBIN 1.4 <=3.0 %    MET-HEMOGLOBIN <0.7 <=1.5 %    O2CT 12.6 %    SODIUM 143 136 - 145 mmol/L    WHOLE BLOOD POTASSIUM 3.3 (L) 3.5 - 5.1 mmol/L    CHLORIDE 105 98 - 107  mmol/L    IONIZED CALCIUM 1.22 1.15 - 1.33 mmol/L    GLUCOSE 198 (H) 65 - 125 mg/dL    LACTATE 5.9 (HH) <=8.0 mmol/L   LACTIC ACID LEVEL W/ REFLEX FOR LEVEL >2.0   Result Value Ref Range    LACTIC ACID 6.2 (HH) 0.5 - 2.0 mmol/L   POC BLOOD GLUCOSE (RESULTS)   Result Value Ref Range    GLUCOSE, POC 176 (H) 70 - 100 mg/dl   PRODUCT: CRYOPRECIPITATE - UNITS   Result Value Ref Range    Coding System [id number]     UNIT NUMBER [id number]     BLOOD COMPONENT TYPE Thawed Pooled Cryoprecipitate (x5)     UNIT DIVISION 00     UNIT DISPENSE STATUS ISSUED,FINAL     TRANSFUSION STATUS OK TO TRANSFUSE     Product Code E3591V00     Coding System [id number]     UNIT NUMBER [id number]     BLOOD COMPONENT TYPE Thawed Pooled Cryoprecipitate (x5)     UNIT DIVISION 00     UNIT DISPENSE STATUS ISSUED,FINAL     TRANSFUSION STATUS OK TO TRANSFUSE     Product Code E3591V00    CBC   Result Value Ref Range    WBC 5.5 3.7 - 11.0 x10^3/uL    RBC 3.16 (L) 3.85 - 5.22 x10^6/uL    HGB 9.2 (L) 11.5 - 16.0 g/dL    HCT 73.1 (L) 65.1 - 46.0 %    MCV 84.8 78.0 - 100.0 fL    MCH 29.1 26.0 - 32.0 pg    MCHC 34.3 31.0 - 35.5 g/dL    RDW-CV 85.0 88.4 - 84.4 %    PLATELETS 49 (L) 150 - 400 x10^3/uL    MPV 10.1 8.7 - 12.5 fL   COMPREHENSIVE METABOLIC PANEL, NON-FASTING   Result Value Ref Range    SODIUM 149 (H) 133 - 144 mmol/L    POTASSIUM 3.7 3.2 - 5.0 mmol/L    CHLORIDE 103 96 - 106 mmol/L    CO2 TOTAL 26 22 - 30 mmol/L    ANION GAP 20 (H) 7 - 18 mmol/L  BUN 56 (H) 8 - 23 mg/dL    CREATININE 8.53 (H) 0.50 - 0.90 mg/dL    ESTIMATED GFR 39 (L) >90 mL/min/1.50m^2    ALBUMIN 3.0 (L) 3.5 - 5.2 g/dL    CALCIUM 9.4 8.3 - 89.2 mg/dL    GLUCOSE 849 (H) 74 - 109 mg/dL    ALKALINE PHOSPHATASE 297 (H) 35 - 129 U/L    ALT (SGPT) 1,038 (H) 0 - 33 U/L    AST (SGOT) 2,763 (H) 0 - 32 U/L    BILIRUBIN TOTAL 5.7 (H) 0.2 - 1.2 mg/dL    PROTEIN TOTAL 5.2 (L) 6.4 - 8.3 g/dL   IONIZED CALCIUM WITH PH   Result Value Ref Range    PH (VENOUS) 7.44 (H) 7.32 - 7.43    IONIZED  CALCIUM 1.20 1.15 - 1.33 mmol/L   MAGNESIUM    Result Value Ref Range    MAGNESIUM  1.8 1.6 - 2.4 mg/dL   PHOSPHORUS   Result Value Ref Range    PHOSPHORUS 4.7 (H) 2.5 - 4.5 mg/dL   PT/INR   Result Value Ref Range    PROTHROMBIN TIME 21.4 (H) 12.1 - 15.3 seconds    INR 1.84 (H) 0.86 - 1.14   PTT (PARTIAL THROMBOPLASTIN TIME)   Result Value Ref Range    APTT 41.8 (H) 22.4 - 34.8 seconds   RESPIRATORY CULTURE AND GRAM STAIN (PERFORMABLE)    Specimen: Sputum   Result Value Ref Range    GRAM STAIN 4+ Many WBCs     GRAM STAIN 2+ Few Gram Positive Cocci in Pairs     GRAM STAIN 2+ Few Hyphae     GRAM STAIN 2+ Few Yeast    BLOOD GAS W/ CO-OX, LYTES, LACTATE REFLEX Arterial   Result Value Ref Range    %FIO2 (ARTERIAL) 40 %    PH (ARTERIAL) 7.41 7.35 - 7.45    PCO2 (ARTERIAL) 47 (H) 35 - 45 mm/Hg    PO2 (ARTERIAL) 69 (L) 83 - 108 mm/Hg    BICARBONATE (ARTERIAL) 28.3 (H) 21.0 - 28.0 mmol/L    BASE EXCESS (ARTERIAL) 4.4 (H) 0.0 - 3.0 mmol/L    PAO2/FIO2 RATIO 173     O2 SATURATION (ARTERIAL) 93.7 94.0 - 98.0 %    HEMOGLOBIN 11.1 (L) 12.0 - 18.0 g/dL    HEMATOCRITRT 33 (L) 37 - 50 %    OXYHEMOGLOBIN 94.8 90.0 - 95.0 %    CARBOXYHEMOGLOBIN 1.9 <=3.0 %    MET-HEMOGLOBIN <0.7 <=1.5 %    O2CT 14.8 %    SODIUM 143 136 - 145 mmol/L    WHOLE BLOOD POTASSIUM 3.6 3.5 - 5.1 mmol/L    CHLORIDE 104 98 - 107 mmol/L    IONIZED CALCIUM 1.21 1.15 - 1.33 mmol/L    GLUCOSE 138 (H) 65 - 125 mg/dL    LACTATE 5.0 (HH) <=8.0 mmol/L   POC BLOOD GLUCOSE (RESULTS)   Result Value Ref Range    GLUCOSE, POC 188 (H) 70 - 100 mg/dl   CBC   Result Value Ref Range    WBC 6.2 3.7 - 11.0 x10^3/uL    RBC 3.26 (L) 3.85 - 5.22 x10^6/uL    HGB 9.3 (L) 11.5 - 16.0 g/dL    HCT 72.2 (L) 65.1 - 46.0 %    MCV 85.0 78.0 - 100.0 fL    MCH 28.5 26.0 - 32.0 pg    MCHC 33.6 31.0 - 35.5 g/dL    RDW-CV 84.3 (H) 88.4 - 15.5 %  PLATELETS 45 (L) 150 - 400 x10^3/uL   COMPREHENSIVE METABOLIC PANEL, NON-FASTING   Result Value Ref Range    SODIUM 148 (H) 133 - 144 mmol/L    POTASSIUM  3.5 3.2 - 5.0 mmol/L    CHLORIDE 102 96 - 106 mmol/L    CO2 TOTAL 28 22 - 30 mmol/L    ANION GAP 18 7 - 18 mmol/L    BUN 60 (H) 8 - 23 mg/dL    CREATININE 8.34 (H) 0.50 - 0.90 mg/dL    ESTIMATED GFR 34 (L) >90 mL/min/1.60m^2    ALBUMIN 2.9 (L) 3.5 - 5.2 g/dL    CALCIUM 9.4 8.3 - 89.2 mg/dL    GLUCOSE 847 (H) 74 - 109 mg/dL    ALKALINE PHOSPHATASE 308 (H) 35 - 129 U/L    ALT (SGPT) 1,143 (H) 0 - 33 U/L    AST (SGOT) 3,018 (H) 0 - 32 U/L    BILIRUBIN TOTAL 6.4 (H) 0.2 - 1.2 mg/dL    PROTEIN TOTAL 5.2 (L) 6.4 - 8.3 g/dL   IONIZED CALCIUM WITH PH   Result Value Ref Range    PH (VENOUS) 7.45 (H) 7.32 - 7.43    IONIZED CALCIUM 1.20 1.15 - 1.33 mmol/L   MAGNESIUM    Result Value Ref Range    MAGNESIUM  1.8 1.6 - 2.4 mg/dL   PHOSPHORUS   Result Value Ref Range    PHOSPHORUS 4.1 2.5 - 4.5 mg/dL   PT/INR   Result Value Ref Range    PROTHROMBIN TIME 22.8 (H) 12.1 - 15.3 seconds    INR 2.00 (H) 0.86 - 1.14   PTT (PARTIAL THROMBOPLASTIN TIME)   Result Value Ref Range    APTT 46.9 (H) 22.4 - 34.8 seconds   BLOOD GAS W/ CO-OX, LYTES, LACTATE REFLEX Arterial   Result Value Ref Range    %FIO2 (ARTERIAL) 50 %    PH (ARTERIAL) 7.45 7.35 - 7.45    PCO2 (ARTERIAL) 42 35 - 45 mm/Hg    PO2 (ARTERIAL) 96 83 - 108 mm/Hg    BICARBONATE (ARTERIAL) 28.6 (H) 21.0 - 28.0 mmol/L    BASE EXCESS (ARTERIAL) 4.7 (H) 0.0 - 3.0 mmol/L    PAO2/FIO2 RATIO 192     O2 SATURATION (ARTERIAL) 97.8 94.0 - 98.0 %    HEMOGLOBIN 9.8 (L) 12.0 - 18.0 g/dL    HEMATOCRITRT 29 (L) 37 - 50 %    OXYHEMOGLOBIN 96.5 (H) 90.0 - 95.0 %    CARBOXYHEMOGLOBIN 1.4 <=3.0 %    MET-HEMOGLOBIN 1.2 <=1.5 %    O2CT 13.4 %    SODIUM 145 136 - 145 mmol/L    WHOLE BLOOD POTASSIUM 3.4 (L) 3.5 - 5.1 mmol/L    CHLORIDE 104 98 - 107 mmol/L    IONIZED CALCIUM 1.20 1.15 - 1.33 mmol/L    GLUCOSE 138 (H) 65 - 125 mg/dL    LACTATE 3.9 (H) <=8.0 mmol/L   PRODUCT: FFP/PLASMA - UNITS : 1 Units   Result Value Ref Range    Coding System ISBT128     UNIT NUMBER [id number]     BLOOD COMPONENT TYPE  THAWED PLASMA     UNIT DIVISION 00     UNIT DISPENSE STATUS ISSUED     TRANSFUSION STATUS OK TO TRANSFUSE     Product Code [id number]    CBC   Result Value Ref Range    WBC 7.2 3.7 - 11.0 x10^3/uL    RBC 3.11 (L) 3.85 - 5.22 x10^6/uL  HGB 8.7 (L) 11.5 - 16.0 g/dL    HCT 73.7 (L) 65.1 - 46.0 %    MCV 84.2 78.0 - 100.0 fL    MCH 28.0 26.0 - 32.0 pg    MCHC 33.2 31.0 - 35.5 g/dL    RDW-CV 84.3 (H) 88.4 - 15.5 %    PLATELETS 50 (L) 150 - 400 x10^3/uL   COMPREHENSIVE METABOLIC PANEL, NON-FASTING   Result Value Ref Range    SODIUM 150 (H) 133 - 144 mmol/L    POTASSIUM 3.6 3.2 - 5.0 mmol/L    CHLORIDE 103 96 - 106 mmol/L    CO2 TOTAL 26 22 - 30 mmol/L    ANION GAP 21 (H) 7 - 18 mmol/L    BUN 61 (H) 8 - 23 mg/dL    CREATININE 8.38 (H) 0.50 - 0.90 mg/dL    ESTIMATED GFR 35 (L) >90 mL/min/1.1m^2    ALBUMIN 2.9 (L) 3.5 - 5.2 g/dL    CALCIUM 9.2 8.3 - 89.2 mg/dL    GLUCOSE 857 (H) 74 - 109 mg/dL    ALKALINE PHOSPHATASE 326 (H) 35 - 129 U/L    ALT (SGPT) 1,146 (H) 0 - 33 U/L    AST (SGOT) 2,963 (H) 0 - 32 U/L    BILIRUBIN TOTAL 6.4 (H) 0.2 - 1.2 mg/dL    PROTEIN TOTAL 5.0 (L) 6.4 - 8.3 g/dL   IONIZED CALCIUM WITH PH   Result Value Ref Range    PH (VENOUS) 7.46 (H) 7.32 - 7.43    IONIZED CALCIUM 1.17 1.15 - 1.33 mmol/L   MAGNESIUM    Result Value Ref Range    MAGNESIUM  1.9 1.6 - 2.4 mg/dL   PHOSPHORUS   Result Value Ref Range    PHOSPHORUS 4.1 2.5 - 4.5 mg/dL   PT/INR   Result Value Ref Range    PROTHROMBIN TIME 22.3 (H) 12.1 - 15.3 seconds    INR 1.94 (H) 0.86 - 1.14   PTT (PARTIAL THROMBOPLASTIN TIME)   Result Value Ref Range    APTT 44.8 (H) 22.4 - 34.8 seconds   LACTIC ACID - FIRST REFLEX   Result Value Ref Range    LACTIC ACID 5.3 (HH) 0.5 - 2.0 mmol/L   BLOOD GAS W/ CO-OX, LYTES, LACTATE REFLEX Arterial   Result Value Ref Range    %FIO2 (ARTERIAL) 40 %    PH (ARTERIAL) 7.44 7.35 - 7.45    PCO2 (ARTERIAL) 42 35 - 45 mm/Hg    PO2 (ARTERIAL) 78 (L) 83 - 108 mm/Hg    BICARBONATE (ARTERIAL) 28.0 21.0 - 28.0 mmol/L    BASE EXCESS  (ARTERIAL) 3.9 (H) 0.0 - 3.0 mmol/L    PAO2/FIO2 RATIO 195     O2 SATURATION (ARTERIAL) 95.9 94.0 - 98.0 %    HEMOGLOBIN 9.3 (L) 12.0 - 18.0 g/dL    HEMATOCRITRT 28 (L) 37 - 50 %    OXYHEMOGLOBIN 95.6 (H) 90.0 - 95.0 %    CARBOXYHEMOGLOBIN 1.5 <=3.0 %    MET-HEMOGLOBIN 0.8 <=1.5 %    O2CT 12.6 %    SODIUM 145 136 - 145 mmol/L    WHOLE BLOOD POTASSIUM 3.4 (L) 3.5 - 5.1 mmol/L    CHLORIDE 104 98 - 107 mmol/L    IONIZED CALCIUM 1.19 1.15 - 1.33 mmol/L    GLUCOSE 118 65 - 125 mg/dL    LACTATE 4.7 (HH) <=8.0 mmol/L   POC BLOOD GLUCOSE (RESULTS)   Result Value Ref Range    GLUCOSE, POC 136 (  H) 70 - 100 mg/dl   CBC   Result Value Ref Range    WBC 6.2 3.7 - 11.0 x10^3/uL    RBC 2.83 (L) 3.85 - 5.22 x10^6/uL    HGB 8.0 (L) 11.5 - 16.0 g/dL    HCT 75.7 (L) 65.1 - 46.0 %    MCV 85.5 78.0 - 100.0 fL    MCH 28.3 26.0 - 32.0 pg    MCHC 33.1 31.0 - 35.5 g/dL    RDW-CV 84.2 (H) 88.4 - 15.5 %    PLATELETS 36 (L) 150 - 400 x10^3/uL    MPV 11.3 8.7 - 12.5 fL   COMPREHENSIVE METABOLIC PANEL, NON-FASTING   Result Value Ref Range    SODIUM 148 (H) 133 - 144 mmol/L    POTASSIUM 4.1 3.2 - 5.0 mmol/L    CHLORIDE 103 96 - 106 mmol/L    CO2 TOTAL 25 22 - 30 mmol/L    ANION GAP 20 (H) 7 - 18 mmol/L    BUN 61 (H) 8 - 23 mg/dL    CREATININE 8.41 (H) 0.50 - 0.90 mg/dL    ESTIMATED GFR 36 (L) >90 mL/min/1.55m^2    ALBUMIN 3.0 (L) 3.5 - 5.2 g/dL    CALCIUM 9.1 8.3 - 89.2 mg/dL    GLUCOSE 859 (H) 74 - 109 mg/dL    ALKALINE PHOSPHATASE 305 (H) 35 - 129 U/L    ALT (SGPT) 1,130 (H) 0 - 33 U/L    AST (SGOT) 2,911 (H) 0 - 32 U/L    BILIRUBIN TOTAL 6.8 (H) 0.2 - 1.2 mg/dL    PROTEIN TOTAL 5.1 (L) 6.4 - 8.3 g/dL   IONIZED CALCIUM WITH PH   Result Value Ref Range    PH (VENOUS) 7.42 7.32 - 7.43    IONIZED CALCIUM 1.14 (L) 1.15 - 1.33 mmol/L   MAGNESIUM    Result Value Ref Range    MAGNESIUM  2.4 1.6 - 2.4 mg/dL   PHOSPHORUS   Result Value Ref Range    PHOSPHORUS 4.3 2.5 - 4.5 mg/dL   PT/INR   Result Value Ref Range    PROTHROMBIN TIME 23.8 (H) 12.1 - 15.3  seconds    INR 2.12 (H) 0.86 - 1.14   PTT (PARTIAL THROMBOPLASTIN TIME)   Result Value Ref Range    APTT 47.0 (H) 22.4 - 34.8 seconds   LACTIC ACID - FIRST REFLEX   Result Value Ref Range    LACTIC ACID 5.9 (HH) 0.5 - 2.0 mmol/L   BLOOD GAS W/ CO-OX, LYTES, LACTATE REFLEX Arterial   Result Value Ref Range    %FIO2 (ARTERIAL) 40 %    PH (ARTERIAL) 7.40 7.35 - 7.45    PCO2 (ARTERIAL) 44 35 - 45 mm/Hg    PO2 (ARTERIAL) 81 (L) 83 - 108 mm/Hg    BICARBONATE (ARTERIAL) 26.6 21.0 - 28.0 mmol/L    BASE EXCESS (ARTERIAL) 2.2 0.0 - 3.0 mmol/L    PAO2/FIO2 RATIO 203     O2 SATURATION (ARTERIAL) 95.9 94.0 - 98.0 %    HEMOGLOBIN 8.6 (L) 12.0 - 18.0 g/dL    HEMATOCRITRT 26 (L) 37 - 50 %    OXYHEMOGLOBIN 95.1 (H) 90.0 - 95.0 %    CARBOXYHEMOGLOBIN 1.4 <=3.0 %    MET-HEMOGLOBIN 1.1 <=1.5 %    O2CT 11.6 %    SODIUM 143 136 - 145 mmol/L    WHOLE BLOOD POTASSIUM 4.0 3.5 - 5.1 mmol/L    CHLORIDE 105 98 - 107 mmol/L  IONIZED CALCIUM 1.19 1.15 - 1.33 mmol/L    GLUCOSE 135 (H) 65 - 125 mg/dL    LACTATE 5.9 (HH) <=8.0 mmol/L   POC BLOOD GLUCOSE (RESULTS)   Result Value Ref Range    GLUCOSE, POC 149 (H) 70 - 100 mg/dl   PRODUCT: FFP/PLASMA - UNITS : 3 Units   Result Value Ref Range    Coding System [id number]     UNIT NUMBER [id number]     BLOOD COMPONENT TYPE THAWED PLASMA     UNIT DIVISION 00     UNIT DISPENSE STATUS ISSUED     TRANSFUSION STATUS OK TO TRANSFUSE     Product Code [id number]    LACTIC ACID - SECOND REFLEX   Result Value Ref Range    LACTIC ACID 4.7 (HH) 0.5 - 2.0 mmol/L   CBC   Result Value Ref Range    WBC 6.2 3.7 - 11.0 x10^3/uL    RBC 2.63 (L) 3.85 - 5.22 x10^6/uL    HGB 7.5 (L) 11.5 - 16.0 g/dL    HCT 77.1 (L) 65.1 - 46.0 %    MCV 86.7 78.0 - 100.0 fL    MCH 28.5 26.0 - 32.0 pg    MCHC 32.9 31.0 - 35.5 g/dL    RDW-CV 84.0 (H) 88.4 - 15.5 %    PLATELETS 35 (L) 150 - 400 x10^3/uL    MPV 10.1 8.7 - 12.5 fL   IONIZED CALCIUM WITH PH   Result Value Ref Range    PH (VENOUS) 7.38 7.32 - 7.43    IONIZED CALCIUM 1.10 (L) 1.15 -  1.33 mmol/L   PT/INR   Result Value Ref Range    PROTHROMBIN TIME 22.6 (H) 12.1 - 15.3 seconds    INR 1.97 (H) 0.86 - 1.14   PTT (PARTIAL THROMBOPLASTIN TIME)   Result Value Ref Range    APTT 46.5 (H) 22.4 - 34.8 seconds   BLOOD GAS W/ CO-OX, LYTES, LACTATE REFLEX Arterial   Result Value Ref Range    %FIO2 (ARTERIAL) 40 %    PH (ARTERIAL) 7.39 7.35 - 7.45    PCO2 (ARTERIAL) 49 (H) 35 - 45 mm/Hg    PO2 (ARTERIAL) 95 83 - 108 mm/Hg    BICARBONATE (ARTERIAL) 28.2 (H) 21.0 - 28.0 mmol/L    BASE EXCESS (ARTERIAL) 4.2 (H) 0.0 - 3.0 mmol/L    PAO2/FIO2 RATIO 238     O2 SATURATION (ARTERIAL) 97.3 94.0 - 98.0 %    HEMOGLOBIN 8.0 (L) 12.0 - 18.0 g/dL    HEMATOCRITRT 24 (L) 37 - 50 %    OXYHEMOGLOBIN 97.3 (H) 90.0 - 95.0 %    CARBOXYHEMOGLOBIN 1.4 <=3.0 %    MET-HEMOGLOBIN <0.7 <=1.5 %    O2CT 11.1 %    SODIUM 144 136 - 145 mmol/L    WHOLE BLOOD POTASSIUM 3.5 3.5 - 5.1 mmol/L    CHLORIDE 104 98 - 107 mmol/L    IONIZED CALCIUM 1.17 1.15 - 1.33 mmol/L    GLUCOSE 165 (H) 65 - 125 mg/dL    LACTATE 4.8 (HH) <=8.0 mmol/L     CT CHEST ABDOMEN PELVIS WO IV CONTRAST  Result Date: 07/17/2024  Impression 1. Large volume hemoperitoneum, increased from 07/13/2024 CT. Persistent hyperattenuating material in the gallbladder fossa. Correlate with concern for ongoing extravasation. 2. Groundglass opacities in the right greater than left upper lobes new from prior CT suspicious for developing multifocal infectious process. 3. Endotracheal tube terminates above the carina but insinuates towards the  right mainstem bronchus; consider slight retraction. Enteric tube terminating in the gastric body. 4. Right infrahilar mass poorly delineated on noncontrast examination, similar in size to 07/08/2024 CT. Increasing adjacent groundglass opacity and nodularity which could represent infectious/inflammatory process versus progression. 5. Additional chronic findings as above. A Critical Red actionable finding has been sent via the [employer] Actionable  Findings application on 07/17/2024 3:57 AM, Message ID [id number]. Receipt of this communication will be communicated to [facility] RADIOLOGY STAFF or responsible provider and will be documented in [employer] Actionable Findings System upon receiving the acknowledgement. Radiologist location ID: [id number]     CT BRAIN WO IV CONTRAST  Result Date: 07/17/2024  Impression Small-volume left-sided subdural hemorrhage similar to prior CT. Negative for new or increasing blood products. Radiologist location ID: [id number]     XR CHEST AP  Result Date: 07/17/2024  Impression 1. Right IJ central venous catheter in appropriate position. 2. Chronic interstitial changes similar to prior radiograph; superimposed infectious process difficult to exclude. Radiologist location ID: [id number]        Assessment & Plan:  Active Hospital Problems    Diagnosis    Primary Problem: Acute hypoxic respiratory failure (CMS HCC)    Elevated LFTs    Hemoperitoneum    Bile leak    Subdural hematoma (CMS HCC)    NSVT (nonsustained ventricular tachycardia) (CMS HCC)    Heart failure, unspecified HF chronicity, unspecified heart failure type (CMS HCC)    Acute cholecystitis    Thickening of wall of gallbladder with pericholecystic fluid    Symptomatic anemia    COVID-19    Acute blood loss anemia    Transaminitis    Malignant neoplasm of lung, unspecified laterality, unspecified part of lung (CMS HCC)    Pulmonary HTN (CMS HCC)    Pneumonia due to COVID-19 virus    Mood disorder (CMS HCC)    Anemia, unspecified type    Cigarette smoker    Metastatic cancer to liver    History of breast cancer    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  She remains intubated and sedated.  No packed red blood cells requiring a last 24 hours.    [name] still clinically coagulopathic INR still 1.97.  Platelet count low at  34.  Bilirubin still elevated and stable at 6. 6.  This is down from 6.8.      [name] still not a good surgical candidate as of now.  Once again the aim is to correct the coagulopathy as of now.  We will continue to follow patient closely.    This chart was partially dictated by [employer], a voice recognition software.  It may contain errors or words that were not intended to be used.  Please contact the provider for errors or clarifications.    /Ammara Raj JINNY Raider MD  07/18/24 07:39   Department of Surgery            [1] No Known Allergies  [2]   Social History  Tobacco Use    Smoking status: Every Day     Current packs/day: 0.50     Average packs/day: 0.5 packs/day for 52.8 years (26.4 ttl pk-yrs)     Types: Cigarettes     Start date: 1973    Smokeless tobacco: Never  Vaping Use    Vaping status: Never Used   Substance Use Topics    Alcohol use: Yes     Comment: occasional    Drug use: Yes     Types: Marijuana

## 2024-07-18 NOTE — Nurses Notes (Signed)
 Restraint Continuation      Patient continues to have the following condition intubation with invasive lines.       Least restrictive alternatives attempted : concealed tubes/lines, decreased/removed stimulus, reoriented to person, place, time, and treatment, and assessed meds/medical problems    Patient continues to exhibit the following behaviors: uncooperative    The restraint continued to facilitate medical/surgical treatment to ensure safety.    The patient will continue to be evaluated and assessments documented on the flowsheet to ensure that the patient is released from the restraint at the earliest possible time

## 2024-07-18 NOTE — Pharmacy (Signed)
 [facility]  Department of Pharmaceutical Services  Therapeutic Drug Monitoring: Vancomycin  07/18/2024      [name], 68 y.o. female  Date of Birth:  1956-02-07  Date of Admission:  07/08/2024  MRN# [id number]    Weight: 74.9 kg (165 lb 2 oz)  Ideal body weight: 47.8 kg (105 lb 6.1 oz)  Adjusted ideal body weight: 58.6 kg (129 lb 4.4 oz)   Body mass index is 31.2 kg/m.    Subjective / Objective    Reason for Consult: [name] is a 68 y.o. female on vancomycin with an indication of Pneumonia with a goal trough of 13-17 mcg/mL, and, if applicable, an AUC/MIC of 400-600 mcg*hr/mL. Pharmacy is consulted for management of vancomycin.      Requesting Provider: Sharyle Dayhoff     Renal Replacement Therapy: None    Vitals:  Temperature: 36.4 C (97.5 F)  Heart Rate: (!) 106  BP (Non-Invasive): (!) 127/53  Respiratory Rate: 18    Recent Labs:  Lab Results   Component Value Date    WBC 6.2 07/18/2024    BUN 64 (H) 07/18/2024    CREATININE 1.60 (H) 07/18/2024    PRCAL 79.80 (H) 07/17/2024    CRP 8.5 (H) 07/17/2024    VANCOMYCIN 8.1 07/18/2024     Serum creatinine: 1.6 mg/dL (H) 89/90/74 9185  Estimated creatinine clearance: 31.6 mL/min (A)    Vancomycin Dosing & Monitoring    Loading Dose / Date / Time: 1000 mg x1 10/8@0354     Date RPh SCr Current Dose Frequency Level / AUC Comment(s)   10/8 BLC 1.56 Intermittent -  - Level with AM labs 10/9   10/9 KLR 1.6 Intermittent - Random 8.1 1250 mg x1                                Assessment / Plan    [name] subtherapeutic with random serum vancomycin concentration 8.1 mcg/ml   Patient with AKI Scr 1.6 mg/dl (Baseline ~9.2 mg/dl), urine output 9.16 ml/kg/hr las 24 hours (lasix  40 mg given 10/9 AM)   Will give one time dose of 1250 mg today   Next vancomycin level(s) due 10/10 @1000   Continue to monitor renal function and response to therapy  Pharmacy will continue to follow [name]'s drug therapy with the primary team; please contact  covering pharmacist with any questions or concerns    Cosmo Slot, PHARMD  x [phone]  07/18/2024, 09:12    Target levels depends on dosing and monitoring method, AUC vs. trough-based; for AUC-based dosing, units are mcg*hr/mL; for trough-based dosing, units are mcg/mL    Creatinine clearance is estimated using the Cockcroft-Gault equation for adult patients and the Arlana graft for pediatric patients    The Medical Executive Committee at [facility] has granted pharmacists via protocol order the ability to place or discontinue vancomycin level orders as they deem clinically appropriate and place related labs (e.g., CBC/BMP) at least every 3 days or more frequently as they deem clinically appropriate for routine monitoring

## 2024-07-19 ENCOUNTER — Encounter (HOSPITAL_BASED_OUTPATIENT_CLINIC_OR_DEPARTMENT_OTHER): Payer: Self-pay | Admitting: HEMATOLOGY-ONCOLOGY

## 2024-07-19 ENCOUNTER — Inpatient Hospital Stay (HOSPITAL_COMMUNITY)

## 2024-07-19 DIAGNOSIS — R933 Abnormal findings on diagnostic imaging of other parts of digestive tract: Secondary | ICD-10-CM

## 2024-07-19 DIAGNOSIS — R140 Abdominal distension (gaseous): Secondary | ICD-10-CM

## 2024-07-19 DIAGNOSIS — I959 Hypotension, unspecified: Secondary | ICD-10-CM

## 2024-07-19 LAB — CBC
HCT: 21.1 % — ABNORMAL LOW (ref 34.8–46.0)
HCT: 23.4 % — ABNORMAL LOW (ref 34.8–46.0)
HCT: 23.7 % — ABNORMAL LOW (ref 34.8–46.0)
HCT: 26.3 % — ABNORMAL LOW (ref 34.8–46.0)
HGB: 6.4 g/dL — CL (ref 11.5–16.0)
HGB: 7.4 g/dL — ABNORMAL LOW (ref 11.5–16.0)
HGB: 7.5 g/dL — ABNORMAL LOW (ref 11.5–16.0)
HGB: 8.3 g/dL — ABNORMAL LOW (ref 11.5–16.0)
MCH: 28.6 pg (ref 26.0–32.0)
MCH: 28.8 pg (ref 26.0–32.0)
MCH: 28.8 pg (ref 26.0–32.0)
MCH: 29 pg (ref 26.0–32.0)
MCHC: 30.1 g/dL — ABNORMAL LOW (ref 31.0–35.5)
MCHC: 31.2 g/dL (ref 31.0–35.5)
MCHC: 31.6 g/dL (ref 31.0–35.5)
MCHC: 32.1 g/dL (ref 31.0–35.5)
MCV: 89.3 fL (ref 78.0–100.0)
MCV: 92 fL (ref 78.0–100.0)
MCV: 92.2 fL (ref 78.0–100.0)
MCV: 95.4 fL (ref 78.0–100.0)
MPV: 10.6 fL (ref 8.7–12.5)
MPV: 9.2 fL (ref 8.7–12.5)
MPV: 9.4 fL (ref 8.7–12.5)
PLATELETS: 30 x10ˆ3/uL — ABNORMAL LOW (ref 150–400)
PLATELETS: 31 x10ˆ3/uL — ABNORMAL LOW (ref 150–400)
PLATELETS: 32 x10ˆ3/uL — ABNORMAL LOW (ref 150–400)
PLATELETS: 34 x10ˆ3/uL — ABNORMAL LOW (ref 150–400)
RBC: 2.19 x10ˆ6/uL — ABNORMAL LOW (ref 3.85–5.22)
RBC: 2.57 x10ˆ6/uL — ABNORMAL LOW (ref 3.85–5.22)
RBC: 2.62 x10ˆ6/uL — ABNORMAL LOW (ref 3.85–5.22)
RBC: 2.86 x10ˆ6/uL — ABNORMAL LOW (ref 3.85–5.22)
RDW-CV: 16.4 % — ABNORMAL HIGH (ref 11.5–15.5)
RDW-CV: 16.4 % — ABNORMAL HIGH (ref 11.5–15.5)
RDW-CV: 17 % — ABNORMAL HIGH (ref 11.5–15.5)
RDW-CV: 17.3 % — ABNORMAL HIGH (ref 11.5–15.5)
WBC: 5.1 x10ˆ3/uL (ref 3.7–11.0)
WBC: 5.8 x10ˆ3/uL (ref 3.7–11.0)
WBC: 6.1 x10ˆ3/uL (ref 3.7–11.0)
WBC: 6.4 x10ˆ3/uL (ref 3.7–11.0)

## 2024-07-19 LAB — COMPREHENSIVE METABOLIC PANEL, NON-FASTING
ALBUMIN: 3 g/dL — ABNORMAL LOW (ref 3.5–5.2)
ALBUMIN: 3.1 g/dL — ABNORMAL LOW (ref 3.5–5.2)
ALBUMIN: 3.2 g/dL — ABNORMAL LOW (ref 3.5–5.2)
ALBUMIN: 3.1 g/dL — ABNORMAL LOW (ref 3.5–5.2)
ALKALINE PHOSPHATASE: 243 U/L — ABNORMAL HIGH (ref 35–129)
ALKALINE PHOSPHATASE: 238 U/L — ABNORMAL HIGH (ref 35–129)
ALKALINE PHOSPHATASE: 236 U/L — ABNORMAL HIGH (ref 35–129)
ALKALINE PHOSPHATASE: 220 U/L — ABNORMAL HIGH (ref 35–129)
ALT (SGPT): 987 U/L — ABNORMAL HIGH (ref 0–33)
ALT (SGPT): 950 U/L — ABNORMAL HIGH (ref 0–33)
ALT (SGPT): 971 U/L — ABNORMAL HIGH (ref 0–33)
ALT (SGPT): 929 U/L — ABNORMAL HIGH (ref 0–33)
ANION GAP: 26 mmol/L — ABNORMAL HIGH (ref 7–18)
ANION GAP: 27 mmol/L — ABNORMAL HIGH (ref 7–18)
ANION GAP: 29 mmol/L — ABNORMAL HIGH (ref 7–18)
ANION GAP: 40 mmol/L — ABNORMAL HIGH (ref 7–18)
AST (SGOT): 2330 U/L — ABNORMAL HIGH (ref 0–32)
AST (SGOT): 2194 U/L — ABNORMAL HIGH (ref 0–32)
AST (SGOT): 2228 U/L — ABNORMAL HIGH (ref 0–32)
AST (SGOT): 2223 U/L — ABNORMAL HIGH (ref 0–32)
BILIRUBIN TOTAL: 7.5 mg/dL — ABNORMAL HIGH (ref 0.2–1.2)
BILIRUBIN TOTAL: 7.7 mg/dL — ABNORMAL HIGH (ref 0.2–1.2)
BILIRUBIN TOTAL: 8.2 mg/dL — ABNORMAL HIGH (ref 0.2–1.2)
BILIRUBIN TOTAL: 8 mg/dL — ABNORMAL HIGH (ref 0.2–1.2)
BUN: 67 mg/dL — ABNORMAL HIGH (ref 8–23)
BUN: 69 mg/dL — ABNORMAL HIGH (ref 8–23)
BUN: 70 mg/dL — ABNORMAL HIGH (ref 8–23)
BUN: 72 mg/dL — ABNORMAL HIGH (ref 8–23)
CALCIUM: 9.5 mg/dL (ref 8.3–10.7)
CALCIUM: 9.4 mg/dL (ref 8.3–10.7)
CALCIUM: 9.4 mg/dL (ref 8.3–10.7)
CALCIUM: 9.2 mg/dL (ref 8.3–10.7)
CHLORIDE: 102 mmol/L (ref 96–106)
CHLORIDE: 101 mmol/L (ref 96–106)
CHLORIDE: 100 mmol/L (ref 96–106)
CHLORIDE: 98 mmol/L (ref 96–106)
CO2 TOTAL: 23 mmol/L (ref 22–30)
CO2 TOTAL: 22 mmol/L (ref 22–30)
CO2 TOTAL: 22 mmol/L (ref 22–30)
CO2 TOTAL: 17 mmol/L — ABNORMAL LOW (ref 22–30)
CREATININE: 1.98 mg/dL — ABNORMAL HIGH (ref 0.50–0.90)
CREATININE: 2.16 mg/dL — ABNORMAL HIGH (ref 0.50–0.90)
CREATININE: 2.24 mg/dL — ABNORMAL HIGH (ref 0.50–0.90)
CREATININE: 2.54 mg/dL — ABNORMAL HIGH (ref 0.50–0.90)
ESTIMATED GFR: 27 mL/min/1.73mˆ2 — ABNORMAL LOW (ref >90)
ESTIMATED GFR: 25 mL/min/1.73mˆ2 — ABNORMAL LOW (ref >90)
ESTIMATED GFR: 23 mL/min/1.73mˆ2 — ABNORMAL LOW (ref >90)
ESTIMATED GFR: 20 mL/min/1.73mˆ2 — ABNORMAL LOW (ref >90)
GLUCOSE: 173 mg/dL — ABNORMAL HIGH (ref 74–109)
GLUCOSE: 128 mg/dL — ABNORMAL HIGH (ref 74–109)
GLUCOSE: 117 mg/dL — ABNORMAL HIGH (ref 74–109)
GLUCOSE: 102 mg/dL (ref 74–109)
POTASSIUM: 3.6 mmol/L (ref 3.2–5.0)
POTASSIUM: 3.5 mmol/L (ref 3.2–5.0)
POTASSIUM: 3.7 mmol/L (ref 3.2–5.0)
POTASSIUM: 4 mmol/L (ref 3.2–5.0)
PROTEIN TOTAL: 5.1 g/dL — ABNORMAL LOW (ref 6.4–8.3)
PROTEIN TOTAL: 5.2 g/dL — ABNORMAL LOW (ref 6.4–8.3)
PROTEIN TOTAL: 5.3 g/dL — ABNORMAL LOW (ref 6.4–8.3)
PROTEIN TOTAL: 5 g/dL — ABNORMAL LOW (ref 6.4–8.3)
SODIUM: 151 mmol/L — ABNORMAL HIGH (ref 133–144)
SODIUM: 150 mmol/L — ABNORMAL HIGH (ref 133–144)
SODIUM: 151 mmol/L — ABNORMAL HIGH (ref 133–144)
SODIUM: 155 mmol/L — ABNORMAL HIGH (ref 133–144)

## 2024-07-19 LAB — BLOOD GAS W/ CO-OX, LYTES, LACTATE REFLEX
%FIO2 (ARTERIAL): 21 %
%FIO2 (ARTERIAL): 40 %
%FIO2 (ARTERIAL): 50 %
%FIO2 (ARTERIAL): 50 %
BASE DEFICIT: 1.2 mmol/L (ref 0.0–3.0)
BASE DEFICIT: 10.8 mmol/L — ABNORMAL HIGH (ref 0.0–3.0)
BASE DEFICIT: 4.5 mmol/L — ABNORMAL HIGH (ref 0.0–3.0)
BASE DEFICIT: 9.5 mmol/L — ABNORMAL HIGH (ref 0.0–3.0)
BICARBONATE (ARTERIAL): 16.5 mmol/L — ABNORMAL LOW (ref 21.0–28.0)
BICARBONATE (ARTERIAL): 17.5 mmol/L — ABNORMAL LOW (ref 21.0–28.0)
BICARBONATE (ARTERIAL): 21.4 mmol/L (ref 21.0–28.0)
BICARBONATE (ARTERIAL): 24 mmol/L (ref 21.0–28.0)
CARBOXYHEMOGLOBIN: 1 % (ref <=3.0)
CARBOXYHEMOGLOBIN: 1.5 % (ref <=3.0)
CARBOXYHEMOGLOBIN: 2.5 % (ref <=3.0)
CARBOXYHEMOGLOBIN: 3.2 % — ABNORMAL HIGH (ref <=3.0)
CHLORIDE: 101 mmol/L (ref 98–107)
CHLORIDE: 101 mmol/L (ref 98–107)
CHLORIDE: 102 mmol/L (ref 98–107)
CHLORIDE: 102 mmol/L (ref 98–107)
GLUCOSE: 101 mg/dL (ref 65–125)
GLUCOSE: 112 mg/dL (ref 65–125)
GLUCOSE: 161 mg/dL — ABNORMAL HIGH (ref 65–125)
GLUCOSE: 95 mg/dL (ref 65–125)
HEMATOCRITRT: 21 % — ABNORMAL LOW (ref 37–50)
HEMATOCRITRT: 23 % — ABNORMAL LOW (ref 37–50)
HEMATOCRITRT: 24 % — ABNORMAL LOW (ref 37–50)
HEMATOCRITRT: 26 % — ABNORMAL LOW (ref 37–50)
HEMOGLOBIN: 6.9 g/dL — CL (ref 12.0–18.0)
HEMOGLOBIN: 7.5 g/dL — ABNORMAL LOW (ref 12.0–18.0)
HEMOGLOBIN: 8.1 g/dL — ABNORMAL LOW (ref 12.0–18.0)
HEMOGLOBIN: 8.8 g/dL — ABNORMAL LOW (ref 12.0–18.0)
IONIZED CALCIUM: 1.13 mmol/L — ABNORMAL LOW (ref 1.15–1.33)
IONIZED CALCIUM: 1.19 mmol/L (ref 1.15–1.33)
IONIZED CALCIUM: 1.19 mmol/L (ref 1.15–1.33)
IONIZED CALCIUM: 1.23 mmol/L (ref 1.15–1.33)
LACTATE: 16.2 mmol/L (ref <=1.9)
LACTATE: 8.3 mmol/L (ref <=1.9)
LACTATE: 9.7 mmol/L (ref <=1.9)
LACTATE: >17 mmol/L (ref <=1.9)
MET-HEMOGLOBIN: 0.9 % (ref <=1.5)
MET-HEMOGLOBIN: <0.7 % (ref <=1.5)
MET-HEMOGLOBIN: <0.7 % (ref <=1.5)
MET-HEMOGLOBIN: <0.7 % (ref <=1.5)
O2 SATURATION (ARTERIAL): 91 % (ref 94.0–98.0)
O2 SATURATION (ARTERIAL): 92 % (ref 94.0–98.0)
O2 SATURATION (ARTERIAL): 97 % (ref 94.0–98.0)
O2 SATURATION (ARTERIAL): 97.7 % (ref 94.0–98.0)
O2CT: 10.3 %
O2CT: 11.3 %
O2CT: 12.1 %
O2CT: 9.5 %
OXYHEMOGLOBIN: 96.3 % — ABNORMAL HIGH (ref 90.0–95.0)
OXYHEMOGLOBIN: 96.8 % — ABNORMAL HIGH (ref 90.0–95.0)
OXYHEMOGLOBIN: 96.8 % — ABNORMAL HIGH (ref 90.0–95.0)
OXYHEMOGLOBIN: 96.9 % — ABNORMAL HIGH (ref 90.0–95.0)
PAO2/FIO2 RATIO: 194
PAO2/FIO2 RATIO: 195
PAO2/FIO2 RATIO: 218
PAO2/FIO2 RATIO: 376
PCO2 (ARTERIAL): 44 mmHg (ref 35–45)
PCO2 (ARTERIAL): 47 mmHg — ABNORMAL HIGH (ref 35–45)
PCO2 (ARTERIAL): 47 mmHg — ABNORMAL HIGH (ref 35–45)
PCO2 (ARTERIAL): 48 mmHg — ABNORMAL HIGH (ref 35–45)
PH (ARTERIAL): 7.16 — CL (ref 7.35–7.45)
PH (ARTERIAL): 7.19 — CL (ref 7.35–7.45)
PH (ARTERIAL): 7.3 — ABNORMAL LOW (ref 7.35–7.45)
PH (ARTERIAL): 7.33 — ABNORMAL LOW (ref 7.35–7.45)
PO2 (ARTERIAL): 109 mmHg — ABNORMAL HIGH (ref 83–108)
PO2 (ARTERIAL): 78 mmHg — ABNORMAL LOW (ref 83–108)
PO2 (ARTERIAL): 79 mmHg — ABNORMAL LOW (ref 83–108)
PO2 (ARTERIAL): 97 mmHg (ref 83–108)
SODIUM: 145 mmol/L (ref 136–145)
SODIUM: 145 mmol/L (ref 136–145)
SODIUM: 146 mmol/L — ABNORMAL HIGH (ref 136–145)
SODIUM: 148 mmol/L — ABNORMAL HIGH (ref 136–145)
WHOLE BLOOD POTASSIUM: 3.4 mmol/L — ABNORMAL LOW (ref 3.5–5.1)
WHOLE BLOOD POTASSIUM: 3.5 mmol/L (ref 3.5–5.1)
WHOLE BLOOD POTASSIUM: 3.9 mmol/L (ref 3.5–5.1)
WHOLE BLOOD POTASSIUM: 4 mmol/L (ref 3.5–5.1)

## 2024-07-19 LAB — HELICOBACTER PYLORI AG, BY EIA: H. PYLORI ANTIGEN: NOT DETECTED

## 2024-07-19 LAB — IONIZED CALCIUM WITH PH
IONIZED CALCIUM: 1.06 mmol/L — ABNORMAL LOW (ref 1.15–1.33)
IONIZED CALCIUM: 1.1 mmol/L — ABNORMAL LOW (ref 1.15–1.33)
IONIZED CALCIUM: 1.15 mmol/L (ref 1.15–1.33)
IONIZED CALCIUM: 1.15 mmol/L (ref 1.15–1.33)
PH (VENOUS): 7.27 — ABNORMAL LOW (ref 7.32–7.43)
PH (VENOUS): 7.31 — ABNORMAL LOW (ref 7.32–7.43)
PH (VENOUS): 7.39 (ref 7.32–7.43)
PH (VENOUS): 7.4 (ref 7.32–7.43)

## 2024-07-19 LAB — PTT (PARTIAL THROMBOPLASTIN TIME)
APTT: 43.8 s — ABNORMAL HIGH (ref 22.4–34.8)
APTT: 44.7 s — ABNORMAL HIGH (ref 22.4–34.8)
APTT: 46 s — ABNORMAL HIGH (ref 22.4–34.8)
APTT: 47.4 s — ABNORMAL HIGH (ref 22.4–34.8)

## 2024-07-19 LAB — RESPIRATORY CULTURE AND GRAM STAIN (PERFORMABLE)

## 2024-07-19 LAB — POC BLOOD GLUCOSE (RESULTS)
GLUCOSE, POC: 119 mg/dL — ABNORMAL HIGH (ref 70–100)
GLUCOSE, POC: 193 mg/dL — ABNORMAL HIGH (ref 70–100)

## 2024-07-19 LAB — PHOSPHORUS
PHOSPHORUS: 4.9 mg/dL — ABNORMAL HIGH (ref 2.5–4.5)
PHOSPHORUS: 4.9 mg/dL — ABNORMAL HIGH (ref 2.5–4.5)
PHOSPHORUS: 5.3 mg/dL — ABNORMAL HIGH (ref 2.5–4.5)
PHOSPHORUS: 6.7 mg/dL — ABNORMAL HIGH (ref 2.5–4.5)

## 2024-07-19 LAB — PT/INR
INR: 1.86 — ABNORMAL HIGH (ref 0.86–1.14)
INR: 1.91 — ABNORMAL HIGH (ref 0.86–1.14)
INR: 1.95 — ABNORMAL HIGH (ref 0.86–1.14)
INR: 2.03 — ABNORMAL HIGH (ref 0.86–1.14)
PROTHROMBIN TIME: 21.6 s — ABNORMAL HIGH (ref 12.1–15.3)
PROTHROMBIN TIME: 22 s — ABNORMAL HIGH (ref 12.1–15.3)
PROTHROMBIN TIME: 22.4 s — ABNORMAL HIGH (ref 12.1–15.3)
PROTHROMBIN TIME: 23.1 s — ABNORMAL HIGH (ref 12.1–15.3)

## 2024-07-19 LAB — LACTIC ACID - FIRST REFLEX: LACTIC ACID: 19.9 mmol/L (ref 0.5–2.0)

## 2024-07-19 LAB — MAGNESIUM
MAGNESIUM: 2.1 mg/dL (ref 1.6–2.4)
MAGNESIUM: 2.2 mg/dL (ref 1.6–2.4)
MAGNESIUM: 2.2 mg/dL (ref 1.6–2.4)
MAGNESIUM: 2.3 mg/dL (ref 1.6–2.4)

## 2024-07-19 LAB — VANCOMYCIN, RANDOM: VANCOMYCIN RANDOM: 17.8 ug/mL

## 2024-07-19 LAB — MRSA SCREEN

## 2024-07-19 MED ORDER — HYDROMORPHONE 1 MG/ML INJECTION WRAPPER
1.0000 mg | INJECTION | INTRAMUSCULAR | Status: DC | PRN
  Administered 2024-07-19: 1 mg via INTRAVENOUS
  Filled 2024-07-19: qty 1

## 2024-07-19 MED ORDER — AMIODARONE 150 MG/100 ML (1.5 MG/ML) IN DEXTROSE, ISO-OSMOTIC IV
150.0000 mg | Freq: Once | INTRAVENOUS | Status: AC
Start: 2024-07-19 — End: 2024-07-19

## 2024-07-19 MED ORDER — SODIUM CHLORIDE 0.9 % IV BOLUS
40.0000 mL | INJECTION | Freq: Once | Status: DC | PRN
Start: 2024-07-19 — End: 2024-07-19

## 2024-07-19 MED ORDER — DIAZEPAM 5 MG/ML INJECTION SYRINGE
5.0000 mg | INJECTION | INTRAMUSCULAR | Status: DC | PRN
Start: 2024-07-19 — End: 2024-07-19
  Administered 2024-07-19: 5 mg via INTRAVENOUS
  Filled 2024-07-19: qty 2

## 2024-07-19 MED ORDER — SODIUM BICARBONATE 8.4 % (1 MEQ/ML) INTRAVENOUS SYRINGE
150.0000 meq | INJECTION | Freq: Once | INTRAVENOUS | Status: AC
Start: 2024-07-19 — End: 2024-07-19
  Administered 2024-07-19: 150 meq via INTRAVENOUS
  Filled 2024-07-19: qty 150

## 2024-07-19 MED ORDER — HUM PROTHROMBIN CPLX (PCC) 4FACTOR 500 UNIT (400-620 UNIT) IV SOLUTION
2000.0000 [IU] | Freq: Once | INTRAVENOUS | Status: AC
Start: 2024-07-19 — End: 2024-07-19
  Administered 2024-07-19: 2000 [IU] via INTRAVENOUS
  Filled 2024-07-19: qty 2000

## 2024-07-19 MED ORDER — VANCOMYCIN 5 GRAM INTRAVENOUS SOLUTION
1250.0000 mg | Freq: Once | INTRAVENOUS | Status: DC
Start: 2024-07-19 — End: 2024-07-19
  Filled 2024-07-19: qty 12.5

## 2024-07-19 MED ORDER — GLYCOPYRROLATE 0.2 MG/ML INJECTION SOLUTION
200.0000 ug | INTRAMUSCULAR | Status: DC | PRN
Start: 2024-07-19 — End: 2024-07-19
  Administered 2024-07-19: 200 ug via INTRAVENOUS
  Filled 2024-07-19: qty 1

## 2024-07-19 MED ORDER — AMIODARONE 150 MG/100 ML (1.5 MG/ML) IN DEXTROSE, ISO-OSMOTIC IV
INTRAVENOUS | Status: AC
Start: 2024-07-19 — End: 2024-07-19
  Administered 2024-07-19: 0 mg via INTRAVENOUS
  Administered 2024-07-19: 150 mg via INTRAVENOUS
  Filled 2024-07-19: qty 100

## 2024-07-19 MED ORDER — SODIUM BICARBONATE 8.4 % (1 MEQ/ML) INTRAVENOUS SYRINGE
50.0000 meq | INJECTION | INTRAVENOUS | Status: AC
Start: 2024-07-19 — End: 2024-07-19
  Administered 2024-07-19: 50 meq via INTRAVENOUS
  Filled 2024-07-19: qty 50

## 2024-07-19 MED ORDER — MONTELUKAST 10 MG TABLET
10.0000 mg | ORAL_TABLET | Freq: Every evening | ORAL | Status: DC
Start: 2024-07-19 — End: 2024-07-19

## 2024-07-20 LAB — CROSSMATCH RED CELLS - UNITS
UNIT DIVISION: 0
UNIT DIVISION: 0
UNIT DIVISION: 0
UNIT DIVISION: 0
UNIT DIVISION: 0
UNIT DIVISION: 0
UNIT DIVISION: 0
UNIT DIVISION: 0
UNIT DIVISION: 0
UNIT DIVISION: 0
UNIT DIVISION: 0

## 2024-07-20 LAB — PRODUCT: PLATELETS - UNITS: UNIT DIVISION: 0

## 2024-07-20 LAB — PRODUCT: FFP/PLASMA - UNITS
UNIT DIVISION: 0
UNIT DIVISION: 0
UNIT DIVISION: 0
UNIT DIVISION: 0
UNIT DIVISION: 0
UNIT DIVISION: 0
UNIT DIVISION: 0
UNIT DIVISION: 0

## 2024-07-20 LAB — TYPE AND SCREEN
ABO/RH(D): O POS
ANTIBODY SCREEN: NEGATIVE
UNITS ORDERED: 10

## 2024-07-22 LAB — ADULT ROUTINE BLOOD CULTURE, SET OF 2 BOTTLES (BACTERIA AND YEAST): BLOOD CULTURE, ROUTINE: NO GROWTH

## 2024-07-23 LAB — ADULT ROUTINE BLOOD CULTURE, SET OF 2 BOTTLES (BACTERIA AND YEAST): BLOOD CULTURE, ROUTINE: NO GROWTH

## 2024-07-31 LAB — FUNGAL IDENTIFICATION, MOLDS (QUEST)

## 2024-07-31 LAB — AFB CULTURE WITH STAIN
AFB CULTURE: NO GROWTH
AFB SMEAR: NONE SEEN

## 2024-08-01 LAB — FUNGAL IDENTIFICATION, MOLDS (QUEST)

## 2024-08-06 ENCOUNTER — Encounter: Payer: Self-pay | Admitting: Family Medicine

## 2024-08-08 ENCOUNTER — Ambulatory Visit (INDEPENDENT_AMBULATORY_CARE_PROVIDER_SITE_OTHER): Admitting: Family Medicine

## 2024-08-08 ENCOUNTER — Encounter: Payer: Self-pay | Admitting: Family Medicine

## 2024-08-08 VITALS — Wt 182.0 lb

## 2024-08-08 DIAGNOSIS — Z0000 Encounter for general adult medical examination without abnormal findings: Secondary | ICD-10-CM

## 2024-08-08 NOTE — Progress Notes (Signed)
 Patient unable to obtain vital signs due to telehealth visit

## 2024-08-08 NOTE — Progress Notes (Signed)
 " [facility] Return Visit   Referring Provider Theophilus Andrews, Tully GRADE, MD [address] [place],  [place] [address]  Primary Care Provider Theophilus Andrews, Tully GRADE, MD  Patient Profile: Judith Thomas is a 68 y.o. female who returns to the [facility] office for follow-up of the problem(s) noted below.  Problem List: Hiatal hernia Schatzki's ring Esophageal dysmotility on barium esophagram 04/2024 History of adenomatous colon polyp History of colonic diverticulosis   History of Present Illness    Discussed the use of AI scribe software for clinical note transcription with the patient, who gave verbal consent to proceed.  History of Present Illness Judith Thomas is a 68 year old female with a history of breast cancer who returns to the gastroenterology office for follow-up of dysphagia  Current GI Meds  None  Interval History  Since her last visit, Judith Thomas underwent barium esophagram and upper endoscopy:  Barium esophagram 05/06/2024 -mild esophageal dysmotility, small hiatal hernia  EGD 05/15/2024 -nonobstructing mild Schatzki's ring Savary dilated to 18 mm, medium hiatal hernia Esophageal biopsies negative for EOE  Reviewed that her dysphagia may be multifactorial: Esophageal dysmotility, hiatal hernia, Schatzki's ring  At today's visit, she reports that her symptoms are significantly improved following esophageal dilation. Careful with chewing and avoids spicy foods to prevent symptoms Previously had issues with chicken but currently tolerates meats and breads well Denies regurgitation, pyrosis, heartburn, nausea or vomiting No abdominal pain or cramping Bowel movements are normal and formed  Up-to-date on colorectal cancer screening performed in 2022 and disclosed 1 nonadvanced tubular adenoma-due for surveillance in 2029   GI Review of Symptoms Significant for none. Otherwise negative.  General Review of Systems  Review of  systems is significant for the pertinent positives and negatives as listed per the HPI.  Full ROS is otherwise negative.  Past Medical History   Past Medical History:  Diagnosis Date   Breast cancer (HCC)    right   Family history of bladder cancer    Family history of breast cancer    Knee pain, chronic 10/11/2015     Past Surgical History   Past Surgical History:  Procedure Laterality Date   BREAST LUMPECTOMY WITH RADIOACTIVE SEED AND SENTINEL LYMPH NODE BIOPSY Right 03/08/2019   Procedure: RIGHT BREAST RADIOACTIVE SEED LUMPECTOMY X2 AND SENTINEL LYMPH NODE BIOPSY;  Surgeon: Curvin Deward MOULD, MD;  Location: [facility];  Service: General;  Laterality: Right;   BREAST REDUCTION SURGERY Left 02/09/2023   Procedure: BREAST REDUCTION WITH LIPOSUCTION;  Surgeon: Lowery Estefana RAMAN, DO;  Location: [facility];  Service: Plastics;  Laterality: Left;   COLONOSCOPY  03/2010   none     PORT-A-CATH REMOVAL Left 12/30/2019   Procedure: REMOVAL PORT-A-CATH;  Surgeon: Curvin Deward MOULD, MD;  Location: [facility];  Service: General;  Laterality: Left;   PORTACATH PLACEMENT Left 11/07/2018   Procedure: INSERTION PORT-A-CATH WITH ULTRASOUND;  Surgeon: Curvin Deward MOULD, MD;  Location: [facility];  Service: General;  Laterality: Left;     Allergies and Medications   Allergies  Allergen Reactions   Paclitaxel  Other (See Comments)    Nausea, flushing    Current Meds  Medication Sig   anastrozole  (ARIMIDEX ) 1 MG tablet TAKE 1 TABLET BY MOUTH EVERY DAY   Calcium Carbonate-Vit D-Min (CALCIUM 1200 PO) Take by mouth.   cholecalciferol (VITAMIN D3) 25 MCG (1000 UNIT) tablet Take 1,000 Units by mouth daily.   cyanocobalamin  (VITAMIN B12) 1000  MCG tablet Take 1,000 mcg by mouth daily.     Family History   Family History  Problem Relation Age of Onset   Liver disease Mother    Heart disease Mother        afib   Heart disease Father    Asthma  Father    Diabetes Father    Bladder Cancer Father        smoker   Breast cancer Maternal Aunt    Breast cancer Maternal Aunt        mets to bone   Heart disease Maternal Grandfather    Kidney disease Paternal Grandmother    Heart disease Paternal Grandfather    Heart disease Other    Colon cancer Neg Hx    Colon polyps Neg Hx    Esophageal cancer Neg Hx    Rectal cancer Neg Hx    Stomach cancer Neg Hx      Social History   Social History   Tobacco Use   Smoking status: Never   Smokeless tobacco: Never  Vaping Use   Vaping status: Never Used  Substance Use Topics   Alcohol use: Yes    Comment: rare use   Drug use: No   Judith Thomas reports that she has never smoked. She has never used smokeless tobacco. She reports current alcohol use. She reports that she does not use drugs.  Vital Signs and Physical Examination   Vitals:   08/13/24 1341  BP: 126/82  Pulse: 87  SpO2: 92%    Body mass index is 30.62 kg/m. Weight: 184 lb (83.5 kg)  General: Well developed, well nourished, no acute distress Head: Normocephalic and atraumatic Eyes: Sclerae anicteric, EOMI Neck: No palpable masses or thyromegaly Lungs: Clear throughout to auscultation Heart: Regular rate and rhythm; No murmurs, rubs or bruits Abdomen: Soft, non tender and non distended. No masses, hepatosplenomegaly or hernias noted. Normal Bowel sounds Rectal: Deferred Musculoskeletal: Symmetrical with no gross deformities   Review of Data  The following data was reviewed at the time of this encounter:  Laboratory Studies      Latest Ref Rng & Units 08/15/2023    8:24 AM 08/11/2022   10:58 AM 08/25/2021   11:12 AM  CBC  WBC 4.0 - 10.5 K/uL 4.9  5.8  4.8   Hemoglobin 12.0 - 15.0 g/dL 87.2  86.6  87.1   Hematocrit 36.0 - 46.0 % 39.1  40.0  39.2   Platelets 150.0 - 400.0 K/uL 255.0  259.0  267.0     No results found for: LIPASE    Latest Ref Rng & Units 08/15/2023    8:24 AM 08/11/2022   10:58 AM  08/24/2021    8:25 AM  CMP  Glucose 70 - 99 mg/dL 94  92  96   BUN 6 - 23 mg/dL 12  9  13    Creatinine 0.40 - 1.20 mg/dL 9.21  9.26  9.27   Sodium 135 - 145 mEq/L 141  140  139   Potassium 3.5 - 5.1 mEq/L 3.9  4.9  4.2   Chloride 96 - 112 mEq/L 103  103  102   CO2 19 - 32 mEq/L 31  30  30    Calcium 8.4 - 10.5 mg/dL 9.5  9.9  9.5   Total Protein 6.0 - 8.3 g/dL 7.4  7.7  7.6   Total Bilirubin 0.2 - 1.2 mg/dL 0.8  0.7  0.7   Alkaline Phos 39 - 117 U/L 119  107  143   AST 0 - 37 U/L 15  15  15    ALT 0 - 35 U/L 10  9  12       Imaging Studies  Barium esophagram 05/06/2024 FINDINGS: Fluoroscopic evaluation demonstrates normal caliber and smooth contour of the esophagus. No evidence of a fixed stricture, mass or mucosal abnormality.   Mild intermittent esophageal dysmotility with tertiary contractions.   Small hiatal hernia.   No gastroesophageal reflux observed.   The patient swallowed a 13 mm barium tablet, which traversed the esophagus and passed in the stomach without delay.   IMPRESSION: 1. Mild esophageal dysmotility with tertiary contractions. 2. Small hiatal hernia. 3. Otherwise unremarkable esophagram, as described.   GI Procedures and Studies  EGD 05/15/2024 - Normal mucosa was found in the entire esophagus.  - Non-obstructing and mild Schatzki ring. Dilated - Savary 18 mm - Normal gastric body, antrum, cardia and gastric fundus.  - Medium-sized hiatal hernia.  - Normal duodenal bulb and second portion of the duodenum.  - Biopsies were taken with a cold forceps for evaluation of eosinophilic esophagitis.  - Hiatal hernia and/or Schatzki's ring could be contributing to symptoms of dysphagia. Biopsies performed to rule out EOE.  Path: normal esophageal biopsies - no EoE  Colonoscopy 04/2021 7 mm TC polyp (TA) , left sided diverticula   Clinical Impression  It is my clinical impression that Judith Thomas is a 68 y.o. female with;  Hiatal hernia Schatzki's  ring Esophageal dysmotility on barium esophagram 04/2024 History of adenomatous colon polyp History of colonic diverticulosis  Judith Thomas presented to the office in July 2025 reporting symptoms of solid food dysphagia.  Subsequent barium esophagram demonstrated esophageal dysmotility.  EGD revealed a small hiatal hernia and Schatzki's ring.  Esophageal dilation was performed with a Savary dilator to 18 mm.  She reports significant improvement in her dysphagia since undergoing dilation.  She is no longer having difficulty swallowing meats or bread which were previously problematic.  Denies active symptoms of GERD.  Given her positive response to esophageal dilation, I suspect that Schatzki's ring may have been a primary contributor to her symptoms although hiatal hernia and esophageal dysmotility could also play a role.  At today's visit we discussed ongoing conservative management and consideration of repeat dilation should symptoms recur.  Judith Thomas is up-to-date for colorectal cancer screening.  Last colonoscopy performed in 2022 disclosed 1 nonadvanced tubular adenoma.  She will be due for her next colonoscopy 04/2028.  Plan  GERD/dysphagia diet and lifestyle modification Continue to take small bites and chew food well, wash food down with water when necessary If symptoms of dysphagia recur advised patient to contact our office and we can consider repeat EGD with dilation. Next surveillance colonoscopy due 04/2028  Planned Follow Up PRN  The patient or caregiver verbalized understanding of the material covered, with no barriers to understanding. All questions were answered. Patient or caregiver is agreeable with the plan outlined above.    It was a pleasure to see Judith Thomas.  If you have any questions or concerns regarding this evaluation, do not hesitate to contact me.  Inocente Hausen, MD [facility]  "

## 2024-08-08 NOTE — Progress Notes (Signed)
 PATIENT CHECK-IN and HEALTH RISK ASSESSMENT QUESTIONNAIRE:  -completed by phone/video for upcoming Medicare Preventive Visit   Pre-Visit Check-in: 1)Vitals (height, wt, BP, etc) - record in vitals section for visit on day of visit Request home vitals (wt, BP, etc.) and enter into vitals, THEN update Vital Signs SmartPhrase below at the top of the HPI. See below.  2)Review and Update Medications, Allergies PMH, Surgeries, Social history in Epic 3)Hospitalizations in the last year with date/reason? no  4)Review and Update Care Team (patient's specialists) in Epic 5) Complete PHQ9 in Epic  6) Complete Fall Screening in Epic 7)Review all Health Maintenance Due and order if not done.  Medicare Wellness Patient Questionnaire:  Answer theses question about your habits: How often do you have a drink containing alcohol? Monthly or less How many drinks containing alcohol do you have on a typical day when you are drinking?1-2 How often do you have six or more drinks on one occasion?n/a Have you ever smoked?no Quit date if applicable? N/a  How many packs a day do/did you smoke? N/a Do you use smokeless tobacco?no Do you use an illicit drugs?no On average, how many days per week do you engage in moderate to strenuous exercise (like a brisk walk)? 7 days  On average, how many minutes do you engage in exercise at this level? 30-40 mins per day, walking, also goes to the gym some to walk if weather is bad Are you sexually active? Yes Number of partners? 1 Typical breakfast: Toast, Eggs Typical lunch: Meat and 2 veggies  Typical dinner: Varies, protein veggies Typical snacks: Peanut butter crackers   Beverages:  Green tea  Social connections: belongs to several bowling leagues - goes several days per week, see a lot of grandkids on a regular basis  Answer theses question about your everyday activities: Can you perform most household chores?yes  Are you deaf or have significant trouble  hearing?no Do you feel that you have a problem with memory?no Do you feel safe at home? Yes  Last dentist visit? 05/2024 8. Do you have any difficulty performing your everyday activities?no Are you having any difficulty walking, taking medications on your own, and or difficulty managing daily home needs?no Do you have difficulty walking or climbing stairs?no Do you have difficulty dressing or bathing?no Do you have difficulty doing errands alone such as visiting a doctor's office or shopping?no Do you currently have any difficulty preparing food and eating?no Do you currently have any difficulty using the toilet?no Do you have any difficulty managing your finances?no Do you have any difficulties with housekeeping of managing your housekeeping?no   Do you have Advanced Directives in place (Living Will, Healthcare Power or Attorney)?  Yes    Last eye Exam and location? 06/05/2024, [facility] Stephania    Do you currently use prescribed or non-prescribed narcotic or opioid pain medications? no  Do you have a history or close family history of breast, ovarian, tubal or peritoneal cancer or a family member with BRCA (breast cancer susceptibility 1 and 2) gene mutations? Yes breast cancer    ----------------------------------------------------------------------------------------------------------------------------------------------------------------------------------------------------------------------  Because this visit was a virtual/telehealth visit, some criteria may be missing or patient reported. Any vitals not documented were not able to be obtained and vitals that have been documented are patient reported.    MEDICARE ANNUAL PREVENTIVE VISIT WITH PROVIDER: (Welcome to Medicare, initial annual wellness or annual wellness exam)  Virtual Visit via Phone Note  I connected with Arland KATHEE Sharps on 08/08/24  by phone and verified that I am speaking with the correct person using two  identifiers. She prefers a phone call.   Location patient: home Location provider:work or home office Persons participating in the virtual visit: patient, provider  Concerns and/or follow up today: stable   See HM section in Epic for other details of completed HM.    ROS: negative for report of fevers, unintentional weight loss, vision changes, vision loss, hearing loss or change, chest pain, sob, hemoptysis, melena, hematochezia, hematuria, falls, bleeding or bruising, thoughts of suicide or self harm, memory loss  Patient-completed extensive health risk assessment - reviewed and discussed with the patient: See Health Risk Assessment completed with patient prior to the visit either above or in recent phone note. This was reviewed in detailed with the patient today and appropriate recommendations, orders and referrals were placed as needed per Summary below and patient instructions.   Review of Medical History: -PMH, PSH, Family History and current specialty and care providers reviewed and updated and listed below   Patient Care Team: Theophilus Andrews, Tully GRADE, MD as PCP - General (Internal Medicine) Tyree Nanetta SAILOR, RN as Oncology Nurse Navigator Izell Domino, MD as Attending Physician (Radiation Oncology) Odean Potts, MD as Consulting Physician (Hematology and Oncology) Curvin Deward MOULD, MD as Consulting Physician (General Surgery)   Past Medical History:  Diagnosis Date   Breast cancer [facility])    right   Family history of bladder cancer    Family history of breast cancer    Knee pain, chronic 10/11/2015    Past Surgical History:  Procedure Laterality Date   BREAST LUMPECTOMY WITH RADIOACTIVE SEED AND SENTINEL LYMPH NODE BIOPSY Right 03/08/2019   Procedure: RIGHT BREAST RADIOACTIVE SEED LUMPECTOMY X2 AND SENTINEL LYMPH NODE BIOPSY;  Surgeon: Curvin Deward MOULD, MD;  Location: [facility];  Service: General;  Laterality: Right;   BREAST REDUCTION SURGERY Left  02/09/2023   Procedure: BREAST REDUCTION WITH LIPOSUCTION;  Surgeon: Lowery Estefana RAMAN, DO;  Location: [facility];  Service: Plastics;  Laterality: Left;   COLONOSCOPY  03/2010   none     PORT-A-CATH REMOVAL Left 12/30/2019   Procedure: REMOVAL PORT-A-CATH;  Surgeon: Curvin Deward MOULD, MD;  Location: [facility];  Service: General;  Laterality: Left;   PORTACATH PLACEMENT Left 11/07/2018   Procedure: INSERTION PORT-A-CATH WITH ULTRASOUND;  Surgeon: Curvin Deward MOULD, MD;  Location: [facility];  Service: General;  Laterality: Left;    Social History   Socioeconomic History   Marital status: Married    Spouse name: michael   Number of children: 2   Years of education: Not on file   Highest education level: Some college, no degree  Occupational History   Not on file  Tobacco Use   Smoking status: Never   Smokeless tobacco: Never  Vaping Use   Vaping status: Never Used  Substance and Sexual Activity   Alcohol use: Yes    Comment: rare use   Drug use: No   Sexual activity: Not on file  Other Topics Concern   Not on file  Social History Narrative   ** Merged History Encounter **       Social Drivers of Health   Financial Resource Strain: Low Risk  (08/08/2024)   Overall Financial Resource Strain (CARDIA)    Difficulty of Paying Living Expenses: Not hard at all  Food Insecurity: No Food Insecurity (08/08/2024)   Hunger Vital Sign    Worried About  Running Out of Food in the Last Year: Never true    Ran Out of Food in the Last Year: Never true  Transportation Needs: No Transportation Needs (08/08/2024)   PRAPARE - Administrator, Civil Service (Medical): No    Lack of Transportation (Non-Medical): No  Physical Activity: Sufficiently Active (08/08/2024)   Exercise Vital Sign    Days of Exercise per Week: 7 days    Minutes of Exercise per Session: 30 min  Stress: No Stress Concern Present (08/08/2024)   [employer] of  Occupational Health - Occupational Stress Questionnaire    Feeling of Stress: Not at all  Social Connections: Socially Integrated (08/08/2024)   Social Connection and Isolation Panel    Frequency of Communication with Friends and Family: More than three times a week    Frequency of Social Gatherings with Friends and Family: More than three times a week    Attends Religious Services: 1 to 4 times per year    Active Member of [employer] or Organizations: Yes    Attends Engineer, Structural: More than 4 times per year    Marital Status: Married  Catering Manager Violence: Not At Risk (08/08/2024)   Humiliation, Afraid, Rape, and Kick questionnaire    Fear of Current or Ex-Partner: No    Emotionally Abused: No    Physically Abused: No    Sexually Abused: No    Family History  Problem Relation Age of Onset   Liver disease Mother    Heart disease Mother        afib   Heart disease Father    Asthma Father    Diabetes Father    Bladder Cancer Father        smoker   Breast cancer Maternal Aunt    Breast cancer Maternal Aunt        mets to bone   Heart disease Maternal Grandfather    Kidney disease Paternal Grandmother    Heart disease Paternal Grandfather    Heart disease Other    Colon cancer Neg Hx    Colon polyps Neg Hx    Esophageal cancer Neg Hx    Rectal cancer Neg Hx    Stomach cancer Neg Hx     Current Outpatient Medications on File Prior to Visit  Medication Sig Dispense Refill   anastrozole  (ARIMIDEX ) 1 MG tablet TAKE 1 TABLET BY MOUTH EVERY DAY 90 tablet 3   Calcium Carbonate-Vit D-Min (CALCIUM 1200 PO) Take by mouth.     cholecalciferol (VITAMIN D3) 25 MCG (1000 UNIT) tablet Take 1,000 Units by mouth daily.     cyanocobalamin  (VITAMIN B12) 1000 MCG tablet Take 1,000 mcg by mouth daily.     No current facility-administered medications on file prior to visit.    Allergies  Allergen Reactions   Paclitaxel  Other (See Comments)    Nausea, flushing        Physical Exam Vitals requested from patient and listed below if patient had equipment and was able to obtain at home for this virtual visit: There were no vitals filed for this visit. Estimated body mass index is 30.29 kg/m as calculated from the following:   Height as of 07/18/24: 5' 5 (1.651 m).   Weight as of this encounter: 182 lb (82.6 kg).  EKG (optional): deferred due to virtual visit  GENERAL: alert, oriented, no acute distress detected, full vision exam deferred due to pandemic and/or virtual encounter  PSYCH/NEURO: pleasant and cooperative, no obvious depression  or anxiety, speech and thought processing grossly intact, Cognitive function grossly intact  Flowsheet Row Clinical Support from 08/08/2024 in [facility] [place]  PHQ-9 Total Score 0        08/08/2024   11:53 AM 08/15/2023    8:02 AM 08/14/2023    4:35 PM 08/11/2022   10:25 AM 12/23/2021   10:01 AM  Depression screen PHQ 2/9  Decreased Interest 0 0 0 0 0  Down, Depressed, Hopeless 0 0 0 0 0  PHQ - 2 Score 0 0 0 0 0  Altered sleeping 0 0  0 0  Tired, decreased energy 0 0  0 0  Change in appetite 0 0  0 0  Feeling bad or failure about yourself  0 0  0 0  Trouble concentrating 0 0  0 0  Moving slowly or fidgety/restless 0 0  0 0  Suicidal thoughts 0 0  0 0  PHQ-9 Score 0 0  0 0  Difficult doing work/chores Not difficult at all Not difficult at all  Not difficult at all Not difficult at all       12/23/2021   10:01 AM 07/14/2022   10:00 AM 08/11/2022   10:26 AM 08/05/2024    1:57 PM 08/08/2024   11:33 AM  Fall Risk  Falls in the past year? 0  0 0 0  Was there an injury with Fall? 0  0 0 0  Fall Risk Category Calculator 0  0 0  0  Fall Risk Category (Retired) Low   Low     (RETIRED) Patient Fall Risk Level Low fall risk  Low fall risk  Low fall risk     Patient at Risk for Falls Due to No Fall Risks  No Fall Risks  No Fall Risks  Fall risk Follow up Falls evaluation  completed   Falls evaluation completed   Falls evaluation completed     Patient-reported   Data saved with a previous flowsheet row definition     SUMMARY AND PLAN:  Encounter for Medicare annual wellness exam   Discussed applicable health maintenance/preventive health measures and advised and referred or ordered per patient preferences: -discussed covid vaccines recs/risks, she knows can get at the pharmacy Health Maintenance  Topic Date Due   COVID-19 Vaccine (6 - 2025-26 season) 06/10/2024   Mammogram  11/23/2024   Medicare Annual Wellness (AWV)  08/08/2025   Colonoscopy  05/03/2028   DTaP/Tdap/Td (3 - Td or Tdap) 08/25/2031   Pneumococcal Vaccine: 50+ Years  Completed   Influenza Vaccine  Completed   DEXA SCAN  Completed   Hepatitis C Screening  Completed   Zoster Vaccines- Shingrix   Completed   Meningococcal B Vaccine  Aged Out      Education and counseling on the following was provided based on the above review of health and a plan/checklist for the patient, along with additional information discussed, was provided for the patient in the patient instructions :  -Advised and counseled on a healthy lifestyle - including the importance of a healthy diet, regular physical activity, social connections -Reviewed patient's current diet. Advised and counseled on a whole foods based healthy diet. Discussed avoiding ultraprocesd foods/drinks and replacing ultraprocessed grains with whole grains and soda with healthier alternatives.A summary of a healthy diet was provided in the Patient Instructions.  -reviewed patient's current physical activity level and discussed exercise guidelines for adults. Congratulated on walking. Encouraged to add in strength training 2x per week.  Discussed community resources and ideas for safe exercise at home to assist in meeting exercise guideline recommendations in a safe and healthy way.  -Advise yearly dental visits at minimum and regular eye  exams -Advised and counseled on alcohol safe limits, risks  Follow up: see patient instructions     Patient Instructions  I really enjoyed getting to talk with you today! I am available on Tuesdays and Thursdays for virtual visits if you have any questions or concerns, or if I can be of any further assistance.   CHECKLIST FROM ANNUAL WELLNESS VISIT:  -Follow up (please call to schedule if not scheduled after visit):   -yearly for annual wellness visit with primary care office  Here is a list of your preventive care/health maintenance measures and the plan for each if any are due:  PLAN For any measures below that may be due:    1. Can get Covid vaccine at the pharmacy. Please let us  know if you do so that we can update your record.   Health Maintenance  Topic Date Due   COVID-19 Vaccine (6 - 2025-26 season) 06/10/2024   Medicare Annual Wellness (AWV)  08/14/2024   Mammogram  11/23/2024   Colonoscopy  05/03/2028   DTaP/Tdap/Td (3 - Td or Tdap) 08/25/2031   Pneumococcal Vaccine: 50+ Years  Completed   Influenza Vaccine  Completed   DEXA SCAN  Completed   Hepatitis C Screening  Completed   Zoster Vaccines- Shingrix   Completed   Meningococcal B Vaccine  Aged Out    -See a dentist at least yearly  -Get your eyes checked and then per your eye specialist's recommendations  -Other issues addressed today:   -I have included below further information regarding a healthy whole foods based diet, physical activity guidelines for adults, stress management and opportunities for social connections. I hope you find this information useful.   -----------------------------------------------------------------------------------------------------------------------------------------------------------------------------------------------------------------------------------------------------------    NUTRITION: -eat real food: lots of colorful vegetables (half the plate) and fruits -5-7  servings of vegetables and fruits per day (fresh or steamed is best), exp. 2 servings of vegetables with lunch and dinner and 2 servings of fruit per day. Berries and greens such as kale and collards are great choices.  -consume on a regular basis:  fresh fruits, fresh veggies, fish, nuts, seeds, healthy oils (such as olive oil, avocado oil), whole grains (make sure for bread/pasta/crackers/etc., that the first ingredient on label contains the word whole), legumes. -can eat small amounts of dairy and lean meat (no larger than the palm of your hand), but avoid processed meats such as ham, bacon, lunch meat, etc. -drink water -try to avoid fast food and pre-packaged foods, processed meat, ultra processed foods/beverages (donuts, candy, etc.) -most experts advise limiting sodium to < 2300mg  per day, should limit further is any chronic conditions such as high blood pressure, heart disease, diabetes, etc. The American Heart Association advised that < 1500mg  is is ideal -try to avoid foods/beverages that contain any ingredients with names you do not recognize  -try to avoid foods/beverages  with added sugar or sweeteners/sweets  -try to avoid sweet drinks (including diet drinks): soda, juice, Gatorade, sweet tea, power drinks, diet drinks -try to avoid white rice, white bread, pasta (unless whole grain)  EXERCISE GUIDELINES FOR ADULTS: -if you wish to increase your physical activity, do so gradually and with the approval of your doctor -STOP and seek medical care immediately if you have any chest pain, chest discomfort or trouble breathing when starting  or increasing exercise  -move and stretch your body, legs, feet and arms when sitting for long periods -Physical activity guidelines for optimal health in adults: -get at least 150 minutes per week of moderate exercise (can talk, but not sing); this is about 20-30 minutes of sustained activity 5-7 days per week or two 10-15 minute episodes of sustained  activity 5-7 days per week -do some muscle building/resistance training/strength training at least 2 days per week  -balance exercises 3+ days per week:   Stand somewhere where you have something sturdy to hold onto if you lose balance    1) lift up on toes, then back down, start with 5x per day and work up to 20x   2) stand and lift one leg straight out to the side so that foot is a few inches of the floor, start with 5x each side and work up to 20x each side   3) stand on one foot, start with 5 seconds each side and work up to 20 seconds on each side  If you need ideas or help with getting more active:  -Silver sneakers https://tools.silversneakers.com  -Walk with a Doc: Http://www.duncan-williams.com/  -try to include resistance (weight lifting/strength building) and balance exercises twice per week: or the following link for ideas: http://castillo-powell.com/  buyducts.dk  STRESS MANAGEMENT: -can try meditating, or just sitting quietly with deep breathing while intentionally relaxing all parts of your body for 5 minutes daily -if you need further help with stress, anxiety or depression please follow up with your primary doctor or contact the wonderful folks at [facility]: [phone]  SOCIAL CONNECTIONS: -options in [place] if you wish to engage in more social and exercise related activities:  -Silver sneakers https://tools.silversneakers.com  -Walk with a Doc: Http://www.duncan-williams.com/  -Check out the [facility] Active Adults 50+ section on the [place] of [employer] companies (hiking clubs, book clubs, cards and games, chess, exercise classes, aquatic classes and much more) - see the website for details: https://www.[place].gov/departments/parks-recreation/active-adults50  -YouTube has lots of exercise videos for different ages and abilities as well  -[facility] (a variety of indoor and outdoor inperson activities for adults). (414) 072-7844. [address]  -Virtual Online Classes (a variety of topics): see seniorplanet.org or call 351-595-0063  -consider volunteering at a school, hospice center, church, senior center or elsewhere            Chiquita JONELLE Cramp, DO

## 2024-08-08 NOTE — Patient Instructions (Signed)
 I really enjoyed getting to talk with you today! I am available on Tuesdays and Thursdays for virtual visits if you have any questions or concerns, or if I can be of any further assistance.   CHECKLIST FROM ANNUAL WELLNESS VISIT:  -Follow up (please call to schedule if not scheduled after visit):   -yearly for annual wellness visit with primary care office  Here is a list of your preventive care/health maintenance measures and the plan for each if any are due:  PLAN For any measures below that may be due:    1. Can get Covid vaccine at the pharmacy. Please let us  know if you do so that we can update your record.   Health Maintenance  Topic Date Due   COVID-19 Vaccine (6 - 2025-26 season) 06/10/2024   Medicare Annual Wellness (AWV)  08/14/2024   Mammogram  11/23/2024   Colonoscopy  05/03/2028   DTaP/Tdap/Td (3 - Td or Tdap) 08/25/2031   Pneumococcal Vaccine: 50+ Years  Completed   Influenza Vaccine  Completed   DEXA SCAN  Completed   Hepatitis C Screening  Completed   Zoster Vaccines- Shingrix   Completed   Meningococcal B Vaccine  Aged Out    -See a dentist at least yearly  -Get your eyes checked and then per your eye specialist's recommendations  -Other issues addressed today:   -I have included below further information regarding a healthy whole foods based diet, physical activity guidelines for adults, stress management and opportunities for social connections. I hope you find this information useful.   -----------------------------------------------------------------------------------------------------------------------------------------------------------------------------------------------------------------------------------------------------------    NUTRITION: -eat real food: lots of colorful vegetables (half the plate) and fruits -5-7 servings of vegetables and fruits per day (fresh or steamed is best), exp. 2 servings of vegetables with lunch and dinner and 2  servings of fruit per day. Berries and greens such as kale and collards are great choices.  -consume on a regular basis:  fresh fruits, fresh veggies, fish, nuts, seeds, healthy oils (such as olive oil, avocado oil), whole grains (make sure for bread/pasta/crackers/etc., that the first ingredient on label contains the word whole), legumes. -can eat small amounts of dairy and lean meat (no larger than the palm of your hand), but avoid processed meats such as ham, bacon, lunch meat, etc. -drink water -try to avoid fast food and pre-packaged foods, processed meat, ultra processed foods/beverages (donuts, candy, etc.) -most experts advise limiting sodium to < 2300mg  per day, should limit further is any chronic conditions such as high blood pressure, heart disease, diabetes, etc. The American Heart Association advised that < 1500mg  is is ideal -try to avoid foods/beverages that contain any ingredients with names you do not recognize  -try to avoid foods/beverages  with added sugar or sweeteners/sweets  -try to avoid sweet drinks (including diet drinks): soda, juice, Gatorade, sweet tea, power drinks, diet drinks -try to avoid white rice, white bread, pasta (unless whole grain)  EXERCISE GUIDELINES FOR ADULTS: -if you wish to increase your physical activity, do so gradually and with the approval of your doctor -STOP and seek medical care immediately if you have any chest pain, chest discomfort or trouble breathing when starting or increasing exercise  -move and stretch your body, legs, feet and arms when sitting for long periods -Physical activity guidelines for optimal health in adults: -get at least 150 minutes per week of moderate exercise (can talk, but not sing); this is about 20-30 minutes of sustained activity 5-7 days per week or two 10-15  minute episodes of sustained activity 5-7 days per week -do some muscle building/resistance training/strength training at least 2 days per week  -balance  exercises 3+ days per week:   Stand somewhere where you have something sturdy to hold onto if you lose balance    1) lift up on toes, then back down, start with 5x per day and work up to 20x   2) stand and lift one leg straight out to the side so that foot is a few inches of the floor, start with 5x each side and work up to 20x each side   3) stand on one foot, start with 5 seconds each side and work up to 20 seconds on each side  If you need ideas or help with getting more active:  -Silver sneakers https://tools.silversneakers.com  -Walk with a Doc: Http://www.duncan-williams.com/  -try to include resistance (weight lifting/strength building) and balance exercises twice per week: or the following link for ideas: http://castillo-powell.com/  buyducts.dk  STRESS MANAGEMENT: -can try meditating, or just sitting quietly with deep breathing while intentionally relaxing all parts of your body for 5 minutes daily -if you need further help with stress, anxiety or depression please follow up with your primary doctor or contact the wonderful folks at [facility]: [phone]  SOCIAL CONNECTIONS: -options in [address] if you wish to engage in more social and exercise related activities:  -Silver sneakers https://tools.silversneakers.com  -Walk with a Doc: Http://www.duncan-williams.com/  -Check out the [facility] Active Adults 50+ section on the [place] of [employer] (hiking clubs, book clubs, cards and games, chess, exercise classes, aquatic classes and much more) - see the website for details: https://www.[place].gov/departments/parks-recreation/active-adults50  -YouTube has lots of exercise videos for different ages and abilities as well  -[facility] (a variety of indoor and outdoor inperson activities for adults). [phone]. [address]  -Virtual Online  Classes (a variety of topics): see seniorplanet.org or call [phone]  -consider volunteering at a school, hospice center, church, senior center or elsewhere

## 2024-08-10 NOTE — Nurses Notes (Signed)
 Patient time of death per provider 08/26/32, patient family at bedside requested funeral home [facility] in [place]. Per policy offered autopsy, family discussed between themselves and with charge nurse. Family declined and decided to continue with funeral home taking custody of patient. Lines, drains, and tubes removed from patient in preparation for transportation by funeral home.

## 2024-08-10 NOTE — Ancillary Notes (Signed)
 08-15-24 1525   Referral Information   Date of Visit August 15, 2024   Reason For Visit: Initial Visit   Referral From: Physician   Reason Given For Referral Comfort Care/Pt Dying   Spiritual Issues at Present Time Actively Dying   Patient Request Prayer   Intervention/Pastoral Plan Provide support to patient/family;Provide prayer/sacraments

## 2024-08-10 NOTE — Progress Notes (Signed)
 [facility]  Hematology/Oncology    Progress Note      Devine, [name], 68 y.o. 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  S/p fall which warranted CT scan of head, revealing subdural hemorrhage overlying left frontal lobe anteriorly and superiorly.  Noted mentation status change as well.  Neurosurgery and Neurology have been consulted.  Has received multiple PRBC transfusions since being admitted to the hospital.  Hemoglobin 6.4 currently.  Platelets continue to decrease.  INR continues to stay elevated.  Has continued to receive FFP while in the ICU.    She remains intubated and sedated at this time.  Multiple family members by bedside    Review of Systems:   Review of Systems   Unable to perform ROS: Intubated     Past Medical History:   Diagnosis Date    Abdominal pain     Cancer (CMS HCC)     Chest pain     Chronic lung disease     Coronary artery disease     Dyslipidemia     Essential hypertension     Heart disease     History of percutaneous coronary intervention     History of stress test     Mixed hyperlipidemia     Unspecified chronic bronchitis      Past Surgical History:   Procedure Laterality Date    CARDIAC CATHETERIZATION      CORONARY ANGIOPLASTY      CORONARY ARTERY STENT PLACEMENT      HX BACK SURGERY      HX CORONARY ARTERY BYPASS GRAFT      HX MASTECTOMY, SIMPLE Bilateral     HX TUBAL LIGATION      NECK SURGERY       Social History     Socioeconomic History    Marital status: Widowed     Spouse name: Not on file    Number of children: Not on file    Years of education: Not on file    Highest education level: Not on file   Occupational History    Not on file   Tobacco Use    Smoking status: Every Day     Current packs/day: 0.50     Average packs/day: 0.5 packs/day for 52.8 years  (26.4 ttl pk-yrs)     Types: Cigarettes     Start date: 36    Smokeless tobacco: Never   Vaping Use    Vaping status: Never Used   Substance and Sexual Activity    Alcohol use: Yes     Comment: occasional    Drug use: Yes     Types: Marijuana    Sexual activity: Not on file   Other Topics Concern    Not on file   Social History Narrative    Not on file     Social Determinants of Health     Financial Resource Strain: Not on file   Transportation Needs: Not on file   Social Connections: Low Risk (07/09/2024)    Social Connections     SDOH Social Isolation: 5 or more times a week   Intimate Partner Violence: Not on file   Housing Stability: Not on file  Family Medical History:       Problem Relation (Age of Onset)    Arthritis Mother, Maternal Grandmother    Cancer Father, Other    Heart Attack Father, Other    Heart Disease Brother, Other    Hypertension (High Blood Pressure) Mother, Father, Other    Pacemaker Mother          Vital Signs:  Temp  Avg: 36.9 C (98.4 F)  Min: 36.4 C (97.5 F)  Max: 37.6 C (99.7 F)    Pulse  Avg: 90.9  Min: 70  Max: 155 No data recorded   Resp  Avg: 18.2  Min: 0  Max: 27 SpO2  Avg: 95.9 %  Min: 90 %  Max: 100 %          Input/Output    Intake/Output Summary (Last 24 hours) at 08-18-24 1313  Last data filed at 2024-08-18 1121  Gross per 24 hour   Intake 3332.37 ml   Output 150 ml   Net 3182.37 ml    I/O last shift:  10/10 0700 - 10/10 1859  In: 350 [Blood:250]  Out: 0      LABS:   Results for orders placed or performed during the hospital encounter of 07/08/24 (from the past 24 hours)   PRODUCT: PLATELETS - UNITS , 1 Units   Result Value Ref Range    Coding System [id number]     UNIT NUMBER [id number]     BLOOD COMPONENT TYPE PATHOGEN REDUCED PLASMA REDUCED2     UNIT DIVISION 00     UNIT DISPENSE STATUS ISSUED     TRANSFUSION STATUS OK TO TRANSFUSE     Product Code Z1657C99    CBC   Result Value Ref Range    WBC 6.5 3.7 - 11.0 x10^3/uL    RBC 3.19 (L) 3.85 - 5.22 x10^6/uL     HGB 9.2 (L) 11.5 - 16.0 g/dL    HCT 71.6 (L) 65.1 - 46.0 %    MCV 88.7 78.0 - 100.0 fL    MCH 28.8 26.0 - 32.0 pg    MCHC 32.5 31.0 - 35.5 g/dL    RDW-CV 83.4 (H) 88.4 - 15.5 %    PLATELETS 34 (L) 150 - 400 x10^3/uL   COMPREHENSIVE METABOLIC PANEL, NON-FASTING   Result Value Ref Range    SODIUM 148 (H) 133 - 144 mmol/L    POTASSIUM 3.6 3.2 - 5.0 mmol/L    CHLORIDE 102 96 - 106 mmol/L    CO2 TOTAL 26 22 - 30 mmol/L    ANION GAP 20 (H) 7 - 18 mmol/L    BUN 64 (H) 8 - 23 mg/dL    CREATININE 8.30 (H) 0.50 - 0.90 mg/dL    ESTIMATED GFR 33 (L) >90 mL/min/1.4m^2    ALBUMIN 3.2 (L) 3.5 - 5.2 g/dL    CALCIUM 9.0 8.3 - 89.2 mg/dL    GLUCOSE 815 (H) 74 - 109 mg/dL    ALKALINE PHOSPHATASE 279 (H) 35 - 129 U/L    ALT (SGPT) 1,116 (H) 0 - 33 U/L    AST (SGOT) 2,743 (H) 0 - 32 U/L    BILIRUBIN TOTAL 7.5 (H) 0.2 - 1.2 mg/dL    PROTEIN TOTAL 5.4 (L) 6.4 - 8.3 g/dL   IONIZED CALCIUM WITH PH   Result Value Ref Range    PH (VENOUS) 7.36 7.32 - 7.43    IONIZED CALCIUM 1.13 (L) 1.15 - 1.33 mmol/L   MAGNESIUM    Result Value  Ref Range    MAGNESIUM  2.1 1.6 - 2.4 mg/dL   PHOSPHORUS   Result Value Ref Range    PHOSPHORUS 4.6 (H) 2.5 - 4.5 mg/dL   PT/INR   Result Value Ref Range    PROTHROMBIN TIME 22.3 (H) 12.1 - 15.3 seconds    INR 1.94 (H) 0.86 - 1.14   PTT (PARTIAL THROMBOPLASTIN TIME)   Result Value Ref Range    APTT 46.1 (H) 22.4 - 34.8 seconds   BLOOD GAS W/ CO-OX, LYTES, LACTATE REFLEX Arterial   Result Value Ref Range    %FIO2 (ARTERIAL) 45 %    PH (ARTERIAL) 7.33 (L) 7.35 - 7.45    PCO2 (ARTERIAL) 57 (H) 35 - 45 mm/Hg    PO2 (ARTERIAL) 74 (L) 83 - 108 mm/Hg    BICARBONATE (ARTERIAL) 27.4 21.0 - 28.0 mmol/L    BASE EXCESS (ARTERIAL) 3.2 (H) 0.0 - 3.0 mmol/L    PAO2/FIO2 RATIO 164     O2 SATURATION (ARTERIAL) 93.6 94.0 - 98.0 %    HEMOGLOBIN 10.2 (L) 12.0 - 18.0 g/dL    HEMATOCRITRT 31 (L) 37 - 50 %    OXYHEMOGLOBIN 94.5 90.0 - 95.0 %    CARBOXYHEMOGLOBIN 1.7 <=3.0 %    MET-HEMOGLOBIN <0.7 <=1.5 %    O2CT 13.6 %    SODIUM 143 136 -  145 mmol/L    WHOLE BLOOD POTASSIUM 3.5 3.5 - 5.1 mmol/L    CHLORIDE 102 98 - 107 mmol/L    IONIZED CALCIUM 1.15 1.15 - 1.33 mmol/L    GLUCOSE 169 (H) 65 - 125 mg/dL    LACTATE 3.5 (H) <=8.0 mmol/L   PRODUCT: FFP/PLASMA - UNITS : 2 Units   Result Value Ref Range    Coding System [id number]     UNIT NUMBER [id number]     BLOOD COMPONENT TYPE THAWED PLASMA     UNIT DIVISION 00     UNIT DISPENSE STATUS ISSUED     TRANSFUSION STATUS OK TO TRANSFUSE     Product Code E2684V00     Coding System ISBT128     UNIT NUMBER [id number]     BLOOD COMPONENT TYPE THAWED PLASMA     UNIT DIVISION 00     UNIT DISPENSE STATUS ISSUED     TRANSFUSION STATUS OK TO TRANSFUSE     Product Code [id number]    POC BLOOD GLUCOSE (RESULTS)   Result Value Ref Range    GLUCOSE, POC 186 (H) 70 - 100 mg/dl   CBC   Result Value Ref Range    WBC 6.0 3.7 - 11.0 x10^3/uL    RBC 2.70 (L) 3.85 - 5.22 x10^6/uL    HGB 7.6 (L) 11.5 - 16.0 g/dL    HCT 76.1 (L) 65.1 - 46.0 %    MCV 88.1 78.0 - 100.0 fL    MCH 28.1 26.0 - 32.0 pg    MCHC 31.9 31.0 - 35.5 g/dL    RDW-CV 83.1 (H) 88.4 - 15.5 %    PLATELETS 40 (L) 150 - 400 x10^3/uL   COMPREHENSIVE METABOLIC PANEL, NON-FASTING   Result Value Ref Range    SODIUM 150 (H) 133 - 144 mmol/L    POTASSIUM 3.6 3.2 - 5.0 mmol/L    CHLORIDE 102 96 - 106 mmol/L    CO2 TOTAL 27 22 - 30 mmol/L    ANION GAP 21 (H) 7 - 18 mmol/L    BUN 65 (H) 8 - 23 mg/dL    CREATININE 8.15 (  H) 0.50 - 0.90 mg/dL    ESTIMATED GFR 30 (L) >90 mL/min/1.11m^2    ALBUMIN 3.2 (L) 3.5 - 5.2 g/dL    CALCIUM 8.9 8.3 - 89.2 mg/dL    GLUCOSE 822 (H) 74 - 109 mg/dL    ALKALINE PHOSPHATASE 257 (H) 35 - 129 U/L    ALT (SGPT) 983 (H) 0 - 33 U/L    AST (SGOT) 2,355 (H) 0 - 32 U/L    BILIRUBIN TOTAL 7.3 (H) 0.2 - 1.2 mg/dL    PROTEIN TOTAL 5.5 (L) 6.4 - 8.3 g/dL   IONIZED CALCIUM WITH PH   Result Value Ref Range    PH (VENOUS) 7.28 (L) 7.32 - 7.43    IONIZED CALCIUM 1.13 (L) 1.15 - 1.33 mmol/L   MAGNESIUM    Result Value Ref Range    MAGNESIUM  2.1 1.6 - 2.4 mg/dL    PHOSPHORUS   Result Value Ref Range    PHOSPHORUS 5.2 (H) 2.5 - 4.5 mg/dL   PT/INR   Result Value Ref Range    PROTHROMBIN TIME 21.0 (H) 12.1 - 15.3 seconds    INR 1.79 (H) 0.86 - 1.14   PTT (PARTIAL THROMBOPLASTIN TIME)   Result Value Ref Range    APTT 43.4 (H) 22.4 - 34.8 seconds   BLOOD GAS W/ CO-OX, LYTES, LACTATE REFLEX Arterial   Result Value Ref Range    %FIO2 (ARTERIAL) 50 %    PH (ARTERIAL) 7.25 (L) 7.35 - 7.45    PCO2 (ARTERIAL) 64 (H) 35 - 45 mm/Hg    PO2 (ARTERIAL) 80 (L) 83 - 108 mm/Hg    BICARBONATE (ARTERIAL) 24.9 21.0 - 28.0 mmol/L    BASE EXCESS (ARTERIAL) 0.0 0.0 - 3.0 mmol/L    PAO2/FIO2 RATIO 160     O2 SATURATION (ARTERIAL) 93.6 94.0 - 98.0 %    HEMOGLOBIN 10.1 (L) 12.0 - 18.0 g/dL    HEMATOCRITRT 30 (L) 37 - 50 %    OXYHEMOGLOBIN 94.1 90.0 - 95.0 %    CARBOXYHEMOGLOBIN 1.4 <=3.0 %    MET-HEMOGLOBIN 0.8 <=1.5 %    O2CT 13.5 %    SODIUM 144 136 - 145 mmol/L    WHOLE BLOOD POTASSIUM 3.4 (L) 3.5 - 5.1 mmol/L    CHLORIDE 103 98 - 107 mmol/L    IONIZED CALCIUM 1.10 (L) 1.15 - 1.33 mmol/L    GLUCOSE 156 (H) 65 - 125 mg/dL    LACTATE 4.9 (HH) <=8.0 mmol/L   BLOOD GAS Arterial   Result Value Ref Range    %FIO2 (ARTERIAL) 40 %    PH (ARTERIAL) 7.35 7.35 - 7.45    PCO2 (ARTERIAL) 46 (H) 35 - 45 mm/Hg    PO2 (ARTERIAL) 138 (H) 83 - 108 mm/Hg    BICARBONATE (ARTERIAL) 24.5 21.0 - 28.0 mmol/L    BASE DEFICIT 0.6 0.0 - 3.0 mmol/L    PAO2/FIO2 RATIO 345     O2 SATURATION (ARTERIAL) 99.0 94.0 - 98.0 %   POC BLOOD GLUCOSE (RESULTS)   Result Value Ref Range    GLUCOSE, POC 193 (H) 70 - 100 mg/dl   CBC   Result Value Ref Range    WBC 5.1 3.7 - 11.0 x10^3/uL    RBC 2.86 (L) 3.85 - 5.22 x10^6/uL    HGB 8.3 (L) 11.5 - 16.0 g/dL    HCT 73.6 (L) 65.1 - 46.0 %    MCV 92.0 78.0 - 100.0 fL    MCH 29.0 26.0 - 32.0 pg  MCHC 31.6 31.0 - 35.5 g/dL    RDW-CV 83.5 (H) 88.4 - 15.5 %    PLATELETS 31 (L) 150 - 400 x10^3/uL    MPV 9.4 8.7 - 12.5 fL   COMPREHENSIVE METABOLIC PANEL, NON-FASTING   Result Value Ref Range    SODIUM  151 (H) 133 - 144 mmol/L    POTASSIUM 3.6 3.2 - 5.0 mmol/L    CHLORIDE 102 96 - 106 mmol/L    CO2 TOTAL 23 22 - 30 mmol/L    ANION GAP 26 (H) 7 - 18 mmol/L    BUN 67 (H) 8 - 23 mg/dL    CREATININE 8.01 (H) 0.50 - 0.90 mg/dL    ESTIMATED GFR 27 (L) >90 mL/min/1.54m^2    ALBUMIN 3.0 (L) 3.5 - 5.2 g/dL    CALCIUM 9.5 8.3 - 89.2 mg/dL    GLUCOSE 826 (H) 74 - 109 mg/dL    ALKALINE PHOSPHATASE 243 (H) 35 - 129 U/L    ALT (SGPT) 987 (H) 0 - 33 U/L    AST (SGOT) 2,330 (H) 0 - 32 U/L    BILIRUBIN TOTAL 7.5 (H) 0.2 - 1.2 mg/dL    PROTEIN TOTAL 5.1 (L) 6.4 - 8.3 g/dL   IONIZED CALCIUM WITH PH   Result Value Ref Range    PH (VENOUS) 7.40 7.32 - 7.43    IONIZED CALCIUM 1.15 1.15 - 1.33 mmol/L   MAGNESIUM    Result Value Ref Range    MAGNESIUM  2.1 1.6 - 2.4 mg/dL   PHOSPHORUS   Result Value Ref Range    PHOSPHORUS 4.9 (H) 2.5 - 4.5 mg/dL   PT/INR   Result Value Ref Range    PROTHROMBIN TIME 22.4 (H) 12.1 - 15.3 seconds    INR 1.95 (H) 0.86 - 1.14   PTT (PARTIAL THROMBOPLASTIN TIME)   Result Value Ref Range    APTT 46.0 (H) 22.4 - 34.8 seconds   BLOOD GAS W/ CO-OX, LYTES, LACTATE REFLEX Arterial   Result Value Ref Range    %FIO2 (ARTERIAL) 50 %    PH (ARTERIAL) 7.33 (L) 7.35 - 7.45    PCO2 (ARTERIAL) 47 (H) 35 - 45 mm/Hg    PO2 (ARTERIAL) 97 83 - 108 mm/Hg    BICARBONATE (ARTERIAL) 24.0 21.0 - 28.0 mmol/L    BASE DEFICIT 1.2 0.0 - 3.0 mmol/L    PAO2/FIO2 RATIO 194     O2 SATURATION (ARTERIAL) 97.0 94.0 - 98.0 %    HEMOGLOBIN 8.8 (L) 12.0 - 18.0 g/dL    HEMATOCRITRT 26 (L) 37 - 50 %    OXYHEMOGLOBIN 96.8 (H) 90.0 - 95.0 %    CARBOXYHEMOGLOBIN 1.0 <=3.0 %    MET-HEMOGLOBIN <0.7 <=1.5 %    O2CT 12.1 %    SODIUM 145 136 - 145 mmol/L    WHOLE BLOOD POTASSIUM 3.5 3.5 - 5.1 mmol/L    CHLORIDE 101 98 - 107 mmol/L    IONIZED CALCIUM 1.23 1.15 - 1.33 mmol/L    GLUCOSE 161 (H) 65 - 125 mg/dL    LACTATE 8.3 (HH) <=8.0 mmol/L   PRODUCT: FFP/PLASMA - UNITS : 2 Units   Result Value Ref Range    Coding System [id number]     UNIT NUMBER [id number]      BLOOD COMPONENT TYPE THAWED PLASMA     UNIT DIVISION 00     UNIT DISPENSE STATUS ISSUED     TRANSFUSION STATUS OK TO TRANSFUSE     Product Code [id number]  Coding System ISBT128     UNIT NUMBER [id number]     BLOOD COMPONENT TYPE THAWED PLASMA     UNIT DIVISION 00     UNIT DISPENSE STATUS ISSUED     TRANSFUSION STATUS OK TO TRANSFUSE     Product Code [id number]    CBC   Result Value Ref Range    WBC 5.8 3.7 - 11.0 x10^3/uL    RBC 2.62 (L) 3.85 - 5.22 x10^6/uL    HGB 7.5 (L) 11.5 - 16.0 g/dL    HCT 76.5 (L) 65.1 - 46.0 %    MCV 89.3 78.0 - 100.0 fL    MCH 28.6 26.0 - 32.0 pg    MCHC 32.1 31.0 - 35.5 g/dL    RDW-CV 83.5 (H) 88.4 - 15.5 %    PLATELETS 34 (L) 150 - 400 x10^3/uL    MPV 10.6 8.7 - 12.5 fL   COMPREHENSIVE METABOLIC PANEL, NON-FASTING   Result Value Ref Range    SODIUM 150 (H) 133 - 144 mmol/L    POTASSIUM 3.5 3.2 - 5.0 mmol/L    CHLORIDE 101 96 - 106 mmol/L    CO2 TOTAL 22 22 - 30 mmol/L    ANION GAP 27 (H) 7 - 18 mmol/L    BUN 69 (H) 8 - 23 mg/dL    CREATININE 7.83 (H) 0.50 - 0.90 mg/dL    ESTIMATED GFR 25 (L) >90 mL/min/1.54m^2    ALBUMIN 3.1 (L) 3.5 - 5.2 g/dL    CALCIUM 9.4 8.3 - 89.2 mg/dL    GLUCOSE 871 (H) 74 - 109 mg/dL    ALKALINE PHOSPHATASE 238 (H) 35 - 129 U/L    ALT (SGPT) 950 (H) 0 - 33 U/L    AST (SGOT) 2,194 (H) 0 - 32 U/L    BILIRUBIN TOTAL 7.7 (H) 0.2 - 1.2 mg/dL    PROTEIN TOTAL 5.2 (L) 6.4 - 8.3 g/dL   IONIZED CALCIUM WITH PH   Result Value Ref Range    PH (VENOUS) 7.31 (L) 7.32 - 7.43    IONIZED CALCIUM 1.15 1.15 - 1.33 mmol/L   MAGNESIUM    Result Value Ref Range    MAGNESIUM  2.2 1.6 - 2.4 mg/dL   PHOSPHORUS   Result Value Ref Range    PHOSPHORUS 4.9 (H) 2.5 - 4.5 mg/dL   PT/INR   Result Value Ref Range    PROTHROMBIN TIME 22.0 (H) 12.1 - 15.3 seconds    INR 1.91 (H) 0.86 - 1.14   PTT (PARTIAL THROMBOPLASTIN TIME)   Result Value Ref Range    APTT 43.8 (H) 22.4 - 34.8 seconds   BLOOD GAS W/ CO-OX, LYTES, LACTATE REFLEX Arterial   Result Value Ref Range    %FIO2 (ARTERIAL) 50 %     PH (ARTERIAL) 7.30 (L) 7.35 - 7.45    PCO2 (ARTERIAL) 44 35 - 45 mm/Hg    PO2 (ARTERIAL) 109 (H) 83 - 108 mm/Hg    BICARBONATE (ARTERIAL) 21.4 21.0 - 28.0 mmol/L    BASE DEFICIT 4.5 (H) 0.0 - 3.0 mmol/L    PAO2/FIO2 RATIO 218     O2 SATURATION (ARTERIAL) 97.7 94.0 - 98.0 %    HEMOGLOBIN 8.1 (L) 12.0 - 18.0 g/dL    HEMATOCRITRT 24 (L) 37 - 50 %    OXYHEMOGLOBIN 96.9 (H) 90.0 - 95.0 %    CARBOXYHEMOGLOBIN 1.5 <=3.0 %    MET-HEMOGLOBIN 0.9 <=1.5 %    O2CT 11.3 %    SODIUM 145 136 -  145 mmol/L    WHOLE BLOOD POTASSIUM 3.4 (L) 3.5 - 5.1 mmol/L    CHLORIDE 102 98 - 107 mmol/L    IONIZED CALCIUM 1.19 1.15 - 1.33 mmol/L    GLUCOSE 112 65 - 125 mg/dL    LACTATE 9.7 (HH) <=8.0 mmol/L   CBC   Result Value Ref Range    WBC 6.1 3.7 - 11.0 x10^3/uL    RBC 2.57 (L) 3.85 - 5.22 x10^6/uL    HGB 7.4 (L) 11.5 - 16.0 g/dL    HCT 76.2 (L) 65.1 - 46.0 %    MCV 92.2 78.0 - 100.0 fL    MCH 28.8 26.0 - 32.0 pg    MCHC 31.2 31.0 - 35.5 g/dL    RDW-CV 82.9 (H) 88.4 - 15.5 %    PLATELETS 32 (L) 150 - 400 x10^3/uL   COMPREHENSIVE METABOLIC PANEL, NON-FASTING   Result Value Ref Range    SODIUM 151 (H) 133 - 144 mmol/L    POTASSIUM 3.7 3.2 - 5.0 mmol/L    CHLORIDE 100 96 - 106 mmol/L    CO2 TOTAL 22 22 - 30 mmol/L    ANION GAP 29 (H) 7 - 18 mmol/L    BUN 70 (H) 8 - 23 mg/dL    CREATININE 7.75 (H) 0.50 - 0.90 mg/dL    ESTIMATED GFR 23 (L) >90 mL/min/1.64m^2    ALBUMIN 3.2 (L) 3.5 - 5.2 g/dL    CALCIUM 9.4 8.3 - 89.2 mg/dL    GLUCOSE 882 (H) 74 - 109 mg/dL    ALKALINE PHOSPHATASE 236 (H) 35 - 129 U/L    ALT (SGPT) 971 (H) 0 - 33 U/L    AST (SGOT) 2,228 (H) 0 - 32 U/L    BILIRUBIN TOTAL 8.2 (H) 0.2 - 1.2 mg/dL    PROTEIN TOTAL 5.3 (L) 6.4 - 8.3 g/dL   IONIZED CALCIUM WITH PH   Result Value Ref Range    PH (VENOUS) 7.39 7.32 - 7.43    IONIZED CALCIUM 1.10 (L) 1.15 - 1.33 mmol/L   MAGNESIUM    Result Value Ref Range    MAGNESIUM  2.2 1.6 - 2.4 mg/dL   PHOSPHORUS   Result Value Ref Range    PHOSPHORUS 5.3 (H) 2.5 - 4.5 mg/dL   PT/INR   Result Value Ref  Range    PROTHROMBIN TIME 21.6 (H) 12.1 - 15.3 seconds    INR 1.86 (H) 0.86 - 1.14   PTT (PARTIAL THROMBOPLASTIN TIME)   Result Value Ref Range    APTT 44.7 (H) 22.4 - 34.8 seconds   VANCOMYCIN, RANDOM   Result Value Ref Range    VANCOMYCIN RANDOM 17.8 See Comment ug/mL   POC BLOOD GLUCOSE (RESULTS)   Result Value Ref Range    GLUCOSE, POC 119 (H) 70 - 100 mg/dl   BLOOD GAS W/ CO-OX, LYTES, LACTATE REFLEX Arterial   Result Value Ref Range    %FIO2 (ARTERIAL) 40 %    PH (ARTERIAL) 7.16 (LL) 7.35 - 7.45    PCO2 (ARTERIAL) 48 (H) 35 - 45 mm/Hg    PO2 (ARTERIAL) 78 (L) 83 - 108 mm/Hg    BICARBONATE (ARTERIAL) 16.5 (L) 21.0 - 28.0 mmol/L    BASE DEFICIT 10.8 (H) 0.0 - 3.0 mmol/L    PAO2/FIO2 RATIO 195     O2 SATURATION (ARTERIAL) 91.0 94.0 - 98.0 %    HEMOGLOBIN 7.5 (L) 12.0 - 18.0 g/dL    HEMATOCRITRT 23 (L) 37 - 50 %  OXYHEMOGLOBIN 96.3 (H) 90.0 - 95.0 %    CARBOXYHEMOGLOBIN 2.5 <=3.0 %    MET-HEMOGLOBIN <0.7 <=1.5 %    O2CT 10.3 %    SODIUM 146 (H) 136 - 145 mmol/L    WHOLE BLOOD POTASSIUM 3.9 3.5 - 5.1 mmol/L    CHLORIDE 102 98 - 107 mmol/L    IONIZED CALCIUM 1.19 1.15 - 1.33 mmol/L    GLUCOSE 101 65 - 125 mg/dL    LACTATE 83.7 (HH) <=8.0 mmol/L   IONIZED CALCIUM WITH PH   Result Value Ref Range    PH (VENOUS) 7.27 (L) 7.32 - 7.43    IONIZED CALCIUM 1.06 (L) 1.15 - 1.33 mmol/L   BLOOD GAS W/ CO-OX, LYTES, LACTATE REFLEX Arterial   Result Value Ref Range    %FIO2 (ARTERIAL) 21 %    PH (ARTERIAL) 7.19 (LL) 7.35 - 7.45    PCO2 (ARTERIAL) 47 (H) 35 - 45 mm/Hg    PO2 (ARTERIAL) 79 (L) 83 - 108 mm/Hg    BICARBONATE (ARTERIAL) 17.5 (L) 21.0 - 28.0 mmol/L    BASE DEFICIT 9.5 (H) 0.0 - 3.0 mmol/L    PAO2/FIO2 RATIO 376     O2 SATURATION (ARTERIAL) 92.0 94.0 - 98.0 %    HEMOGLOBIN 6.9 (LL) 12.0 - 18.0 g/dL    HEMATOCRITRT 21 (L) 37 - 50 %    OXYHEMOGLOBIN 96.8 (H) 90.0 - 95.0 %    CARBOXYHEMOGLOBIN 3.2 (H) <=3.0 %    MET-HEMOGLOBIN <0.7 <=1.5 %    O2CT 9.5 %    SODIUM 148 (H) 136 - 145 mmol/L    WHOLE BLOOD POTASSIUM 4.0 3.5  - 5.1 mmol/L    CHLORIDE 101 98 - 107 mmol/L    IONIZED CALCIUM 1.13 (L) 1.15 - 1.33 mmol/L    GLUCOSE 95 65 - 125 mg/dL    LACTATE >82.9 (HH) <=8.0 mmol/L   CBC   Result Value Ref Range    WBC 6.4 3.7 - 11.0 x10^3/uL    RBC 2.19 (L) 3.85 - 5.22 x10^6/uL    HGB 6.4 (LL) 11.5 - 16.0 g/dL    HCT 78.8 (L) 65.1 - 46.0 %    MCV 95.4 78.0 - 100.0 fL    MCH 28.8 26.0 - 32.0 pg    MCHC 30.1 (L) 31.0 - 35.5 g/dL    RDW-CV 82.6 (H) 88.4 - 15.5 %    PLATELETS 30 (L) 150 - 400 x10^3/uL    MPV 9.2 8.7 - 12.5 fL   COMPREHENSIVE METABOLIC PANEL, NON-FASTING   Result Value Ref Range    SODIUM 155 (H) 133 - 144 mmol/L    POTASSIUM 4.0 3.2 - 5.0 mmol/L    CHLORIDE 98 96 - 106 mmol/L    CO2 TOTAL 17 (L) 22 - 30 mmol/L    ANION GAP 40 (H) 7 - 18 mmol/L    BUN 72 (H) 8 - 23 mg/dL    CREATININE 7.45 (H) 0.50 - 0.90 mg/dL    ESTIMATED GFR 20 (L) >90 mL/min/1.49m^2    ALBUMIN 3.1 (L) 3.5 - 5.2 g/dL    CALCIUM 9.2 8.3 - 89.2 mg/dL    GLUCOSE 897 74 - 890 mg/dL    ALKALINE PHOSPHATASE 220 (H) 35 - 129 U/L    ALT (SGPT) 929 (H) 0 - 33 U/L    AST (SGOT) 2,223 (H) 0 - 32 U/L    BILIRUBIN TOTAL 8.0 (H) 0.2 - 1.2 mg/dL    PROTEIN TOTAL 5.0 (L) 6.4 - 8.3  g/dL   MAGNESIUM    Result Value Ref Range    MAGNESIUM  2.3 1.6 - 2.4 mg/dL   PHOSPHORUS   Result Value Ref Range    PHOSPHORUS 6.7 (H) 2.5 - 4.5 mg/dL   PT/INR   Result Value Ref Range    PROTHROMBIN TIME 23.1 (H) 12.1 - 15.3 seconds    INR 2.03 (H) 0.86 - 1.14   PTT (PARTIAL THROMBOPLASTIN TIME)   Result Value Ref Range    APTT 47.4 (H) 22.4 - 34.8 seconds   LACTIC ACID - FIRST REFLEX   Result Value Ref Range    LACTIC ACID 19.9 (HH) 0.5 - 2.0 mmol/L       IMAGES :  Results for orders placed or performed during the hospital encounter of 07/08/24 (from the past 72 hours)   [facility] CHOLESCINTIGRAPHY HEPATO IMAGING WO EF     Status: None    Narrative    Takasha JEAN Creer    [facility] CHOLESCINTIGRAPHY HEPATO SYSTEM WITHOUT PHARM performed on 07/16/2024 6:44 PM.    INDICATION:  Increased bilirubin s/p  cholecystectomy   Additional History:  abdominal pain, recent cholecystectomy, elevated liver enzymes    TECHNIQUE:  Routine hepatobiliary scan performed with sequential anterior planar imaging of the abdomen.    RADIOPHARMACEUTICAL:  6.9mCi of Tc99m Mebrofenin IV    * Anterior images extending to 4 hours were completed.  * Activity is demonstrated throughout the liver which appears dominant in size, difficult to determine by nuclear medicine study, however enlargement CT 07/13/2024.  * There is marked delay in activity within the biliary system, demonstrated on the 2 and 4 hour delayed images, indicating hepatocellular dysfunction, clinical assessment required.  * 4 hour image reveals subtle increased activity lateral to the lower right hepatic lobe, extending into the right abdomen and pelvis, in the expected region of the paracolic gutter. This is certainly worrisome for bile duct leak.      Impression    * IMPRESSION:    * Study indicating hepatic dysfunction with delay in activity within the biliary system, activity within the expected region of the right paracolic gutter on 4 hour study is worrisome for but not clearly diagnostic of a bile duct leak, ERCP with positive contrast evaluation may certainly be of value      Radiologist location ID: [id number]     XR CHEST AP     Status: None    Narrative    Breklyn JEAN Bolin    XR CHEST AP performed on 07/17/2024 1:33 AM.    INDICATION:  hypoxia   Additional History:  central line placement check; hypoxia    TECHNIQUE:  Single frontal view of the chest.  1 views/1 images submitted for interpretation.    COMPARISON:  Chest radiograph 07/08/2024  ___________________________________  FINDINGS:    Right IJ central venous catheter terminates at the lower SVC. Sternal plates and wires appear intact.  Cardiomediastinal silhouette is normal in size. Atherosclerotic calcification of the aortic knob.  Known right hilar adenopathy. Negative for pleural effusion. Biapical  predominant coarsened interstitial markings likely reflecting chronic parenchymal changes. Negative for pneumothorax.  ___________________________________    Impression    1. Right IJ central venous catheter in appropriate position.  2. Chronic interstitial changes similar to prior radiograph; superimposed infectious process difficult to exclude.      Radiologist location ID: [id number]     CT BRAIN WO IV CONTRAST     Status: None    Narrative    Jeremiah  JEAN Clabo    CT BRAIN WO IV CONTRAST performed on 07/17/2024 3:13 AM.    INDICATION:  known SDH, AMS   Additional History:  known SDH, AMS, mechanical ventilation    TECHNIQUE:  Unenhanced CT head with axial, coronal, and sagittal multiplanar reformations. Dose modulation, automated exposure control, and/or iterative reconstruction were used for dose reduction.    COMPARISON:  CT 07/16/2024  ___________________________________  FINDINGS:    Small volume extra-axial subdural hemorrhage along the anterior and superior left frontal lobe measuring 4 mm (series 10, image 21) and 3 mm (series 10, image 25) respectively not changed from prior CT. Negative for new or increasing blood products. Negative for midline shift.  The ventricles are within normal limits for size, configuration, and symmetry.     The grey-white differentiation is well preserved throughout, with no evidence of acute infarct.    Brain parenchymal volume is appropriate for age. Mild periventricular patchy hypoattenuation reflecting sequela chronic microvascular ischemic change.    Osseous structures are unremarkable.  Left maxillary and scattered ethmoid air cell mucosal thickening. Remaining paranasal sinuses are clear.  Mastoid air cells are clear.   ___________________________________    Impression    Small-volume left-sided subdural hemorrhage similar to prior CT. Negative for new or increasing blood products.        Radiologist location ID: [id number]     CT CHEST ABDOMEN PELVIS WO IV CONTRAST      Status: Abnormal    Narrative    Aviva JEAN Rokosz    CT CHEST ABDOMEN PELVIS WO IV CONTRAST performed on 07/17/2024 3:15 AM.    INDICATION:  AMS, lethargy, hypotension   Additional History:  AMS, lethargy, hypotension, mechanical ventilation, history of CABG    TECHNIQUE:  CT chest, abdomen, and pelvis with axial, coronal, and sagittal multiplanar reformations, performed without contrast. Dose modulation, automated exposure control, and/or iterative reconstruction were used for dose reduction.    COMPARISON:  CT 07/08/2024, CT 07/12/2024, CT 07/13/2024.  ___________________________________  FINDINGS:    CT CHEST:    LUNGS / AIRWAYS: Groundglass opacities of the right upper and left upper lobes as well as the lingula which are new from 07/08/2024 CT. Right perihilar mass is poorly defined given contiguity with the mediastinum on this noncontrast examination, however mass appears grossly similar in size to 07/08/2024 CT. Increasing interstitial prominence at the peripheral aspect of the mass (series 3, image 32).    HEART / GREAT VESSELS: Heart is stable in size. Thoracic aorta is unremarkable. Atherosclerotic calcification of the coronary arteries is severe.    MEDIASTINUM: Confluent right hilar and mediastinal lymphadenopathy appears similar to 07/08/2024 CT of the structures otherwise poorly evaluated on noncontrast examination. Negative for axillary lymphadenopathy.    OTHER:  The visualized thyroid gland is unremarkable.  Endotracheal tube terminates above the carina but insinuates toward the right mainstem bronchus (series 6, image 53). Enteric tube terminates in the stomach.    CT ABDOMEN/PELVIS:    HEPATOBILIARY:  Hepatomegaly measuring approximately 21.6 cm in maximum craniocaudal dimension. Mild periportal edema. Negative for apparent focal liver lesion, limited in evaluation on this noncontrast examination.  Hematoma in the gallbladder fossa not changed from 07/13/2024 CT.  Negative for apparent intrahepatic  biliary ductal dilation. Extrahepatic bile duct are well-visualized.  PANCREAS: No focal masses or ductal dilatation.  SPLEEN: No splenomegaly.    ADRENALS: No adrenal nodules.  KIDNEYS/URETERS/BLADDER: Negative for hydroureteronephrosis. Multiple nonobstructing calculi bilaterally similar to prior CT. Urinary bladder is decompressed, limited for  evaluation.  REPRODUCTIVE: Negative for acute abnormality.    PERITONEUM / RETROPERITONEUM: Large volume hemoperitoneum which is predominantly low attenuation with layering increased attenuation in the pelvic cul-de-sac increased in volume from 07/13/2024 CT. Negative for pneumoperitoneum.  LYMPH NODES: No lymphadenopathy.  VESSELS: Abdominal aorta is normal in caliber.    GI TRACT: No distention, wall thickening, or inflammatory stranding.  The appendix is not seen, but there are no secondary signs of appendicitis.  ABDOMINAL WALL:  Postsurgical change of the ventral abdominal wall. Few subcutaneous hematomas in the right ventral abdominal wall not changed from prior CT.    SOFT TISSUES: Unremarkable.    CT BONES:  Negative for acute displaced fracture or dislocation.  ___________________________________    Impression    1. Large volume hemoperitoneum, increased from 07/13/2024 CT. Persistent hyperattenuating material in the gallbladder fossa. Correlate with concern for ongoing extravasation.  2. Groundglass opacities in the right greater than left upper lobes new from prior CT suspicious for developing multifocal infectious process.   3. Endotracheal tube terminates above the carina but insinuates towards the right mainstem bronchus; consider slight retraction. Enteric tube terminating in the gastric body.  4. Right infrahilar mass poorly delineated on noncontrast examination, similar in size to 07/08/2024 CT. Increasing adjacent groundglass opacity and nodularity which could represent infectious/inflammatory process versus progression.  5. Additional chronic findings as  above.      A Critical Red actionable finding has been sent via the [employer] Actionable Findings application on 07/17/2024 3:57 AM, Message ID [id number]. Receipt of this communication will be communicated to [facility] RADIOLOGY STAFF or responsible provider and will be documented in [employer] Actionable Findings System upon receiving the acknowledgement.      Radiologist location ID: [id number]     XR AP MOBILE CHEST     Status: None    Narrative    Janeliz JEAN Towner    XR AP MOBILE CHEST performed on 07/18/2024 9:10 AM.    INDICATION:  Respiratory failure   Additional History:  Respiratory failure    TECHNIQUE:  Single portable frontal view of the chest.  1 views/1 images submitted for interpretation.    COMPARISON:  Yesterday.    Number of Views: 1 views/1 images.    FINDINGS:  Endotracheal tube tip projects 3.5 cm above carina. Gastric tube tip projects below the GE junction. Temperature probe projects at the level of the midesophagus. Central line is unchanged.  Mediastinal contours are unchanged. Sternotomy with CABG.    Negative for pneumothorax. Increasing consolidation in the right upper lung zones and left lung base.      Impression    Support apparatus. Increasing bilateral consolidation.    .  .  .  s/d/g          Radiologist location ID: [id number]     CT BRAIN WO IV CONTRAST     Status: Abnormal    Narrative    Roniqua JEAN Jarnagin    CT BRAIN WO IV CONTRAST performed on 07/28/24 9:45 AM.    INDICATION:  unequal pupils   Additional History:  unequal pupils    TECHNIQUE:  Unenhanced CT head with axial, coronal, and sagittal multiplanar reformations. Dose modulation, automated exposure control, and/or iterative reconstruction were used for dose reduction.    COMPARISON:  Head CT 07/17/2024  ___________________________________  FINDINGS:    Ventricles are normal size and midline. There remains acute appearing left hemispheric subdural hematoma, left frontotemporal and parietal areas, slightly increased in  size compared to  07/17/2024, in the left parietal area and left temporal region, maximum dimension in the inferior lateral frontal area is 5.5 mm. At the left parietal location, thickness is 3 mm and at the anterior left temporal lobe, subdural hematoma measures 4 mm.    Diminished attenuation periventricular white matter suggests chronic white matter ischemia. No mass or midline shift.    The grey-white differentiation is well preserved throughout, with no evidence of acute infarct.    Brain parenchymal volume is appropriate for age.    Osseous structures are unremarkable.  Small amount of fluid in the left maxillary sinus. Small amount of fluid in the mastoid air cells.  ___________________________________    Impression    Subtle, slight increase size in left hemispheric subdural hematoma.    A Critical Red actionable finding has been sent via the [employer] Actionable Findings application on July 30, 2024 10:29 AM, Message ID [id number]. Receipt of this communication will be communicated to [facility] RADIOLOGY STAFF or responsible provider and will be documented in [employer] Actionable Findings System upon receiving the acknowledgement.      Radiologist location ID: [id number]       Physical Exam:  Physical Exam  Vitals reviewed.   Constitutional:       Appearance: Normal appearance. She is normal weight.   HENT:      Head: Normocephalic.      Nose: Nose normal.      Mouth/Throat:      Pharynx: Oropharynx is clear.   Eyes:      Extraocular Movements: Extraocular movements intact.      Conjunctiva/sclera: Conjunctivae normal.   Cardiovascular:      Rate and Rhythm: Normal rate.      Pulses: Normal pulses.   Pulmonary:      Effort: Pulmonary effort is normal.   Abdominal:      Palpations: Abdomen is soft.   Musculoskeletal:         General: Normal range of motion.      Cervical back: Normal range of motion.   Skin:     General: Skin is dry.      Coloration: Skin is jaundiced.   Neurological:      Mental Status: She is  alert. She is disoriented.   Psychiatric:         Cognition and Memory: Cognition is impaired. Memory is impaired.       Problem List:  Active Hospital Problems   (*Primary Problem)    Diagnosis    *Respiratory failure, unspecified chronicity, unspecified whether with hypoxia or hypercapnia (CMS HCC)    Hypotension, unspecified hypotension type    Moderate protein-calorie malnutrition (CMS HCC)    Metastatic cancer to liver    Elevated LFTs    Hemoperitoneum    Bile leak    Subdural hematoma (CMS HCC)    NSVT (nonsustained ventricular tachycardia) (CMS HCC)    Heart failure, unspecified HF chronicity, unspecified heart failure type (CMS HCC)    Acute cholecystitis    Thickening of wall of gallbladder with pericholecystic fluid    Symptomatic anemia    COVID-19    Acute blood loss anemia    Transaminitis    Malignant neoplasm of lung, unspecified laterality, unspecified part of lung (CMS HCC)    Pulmonary HTN (CMS HCC)    Pneumonia due to COVID-19 virus    Mood disorder (CMS HCC)    Anemia, unspecified type    Cigarette smoker    Metastasis to liver    History of  breast cancer     Chronic    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  She has been COVID positive this admission.  CTA chest without evidence of PE.  She is on p.o. antibiotics.    Small-cell lung CA with liver metastasis:  Follows with Medical Oncology on outpatient basis at [facility] ([place]).  She was to receive carboplatin/etoposide/Tecentriq on 07/08/2024 when she presented with acute respiratory symptoms that prompted further evaluation by the emergency room as stated in the above HPI.  Withhold current  chemotherapy regimen until resolution of acute conditions.    Normocytic hypochromic anemia:   Current H/H = 6.9/20.7.  Has received multiple PRBC transfusions since being admitted.  Could be secondary to acute illness or metastatic disease.  Noted hemoperitoneum per recent CT abdomen/pelvis.  Vitamin B12 normal at 1106.  Folate low at 3.5.  On daily folic acid .  Copper normal at 113.  Noted transaminitis as well as elevated alk-phos.  This is likely related to liver mets.  Continue to monitor counts.  Transfuse with PRBC if hemoglobin less than 7 grams/deciliter or greater than 7 and symptomatic.  Will benefit from blood transfusion.    Thrombocytopenia:  Current counts = 34,000.  She is s/p recent fall, with CT head revealing small subdural hemorrhage and associated mental status change.  Currently in ICU for closer monitoring and management.  Low platelet counts secondary to hepatic dysfunction r/t liver metastasis.  Defer further thrombocytopenia panel at this time.  Continue to monitor counts.  Transfuse with platelets if <50 K and actively bleeding or <10 K w/wo bleeding.  Previously slightly elevated PT/INR.  Normal PTT and fibrinogen.       Subdural hemorrhage: As above, as evidenced by CT head.  Neurosurgery and neurology following.  Keep platelets greater than 100,000. Transfuse FFPs if INR greater than 1.5.    Cholelithiasis: General surgery is following.  She is s/p cholecystectomy  07/11/2024.  Noted hemoperitoneum per recent CT abdomen/pelvis.  General surgery without further surgical intervention at this time - recommending serial H/H checks.    Hypertension:  Antihypertensives held this morning due to noted hypotension and in light of recent bleed, mentation change.    HLD:  On statin.    CAD:  History of CABG x1.  81 mg ASA held at this time.    Current plan: Very guarded prognosis.  Family leaning towards comfort measures which were reportedly the patient's wishes to if there is low likelihood for  a meaningful recovery     Norleen Amen, MD

## 2024-08-10 NOTE — Consults (Signed)
 [facility] MEDICINE [facility]  [address]  [place] [address]     Consult  Follow Up Note    Rachel, [name], 68 y.o. female  Date of Birth:  01/14/1956  Encounter Start Date:  07/08/2024  Inpatient Admission Date: 07/08/2024   Date of service: 2024/08/08     Requesting MD: No ref. provider found     Reason for consultation: Acute cholecystitis     HPI:  [name] is a 68 y.o. female who was sent to the emergency department from infusion Center where she had presented to start chemotherapy.  She has a recent diagnosis of small cell carcinoma with liver Mets.  She complained of dyspnea and hypoxia and was diagnosed with COVID.  CT abdomen and pelvis with findings of hepatomegaly, hepatic steatosis and hepatic lesions, new pericholecystic fluid.  General surgery consulted for evaluation.     Subjective:  Patient remains in intensive care unit, family at bedside.  [name] still continues to be intubated and mildly sedated.  She received 1 units of packed red blood cells and FFP during the last 24 hours.  In the last 24 hours this seems that there has been some deterioration in the condition as hemoglobin still low at 6.9 and she is now requiring continuous vasopressor support.  Patient not really waking up and as such went for CT scan this morning which shows slight increase in subdural hematoma.     Abdomen distended.  Patient now made DNR.    Historical Data   Past Medical History:   Diagnosis Date    Abdominal pain     Cancer (CMS HCC)     Chest pain     Chronic lung disease     Coronary artery disease     Dyslipidemia     Essential hypertension     Heart disease     History of percutaneous coronary intervention     History of stress test     Mixed hyperlipidemia     Unspecified chronic bronchitis          Past Surgical History:   Procedure Laterality Date    CARDIAC CATHETERIZATION      CORONARY ANGIOPLASTY      CORONARY ARTERY STENT PLACEMENT      HX BACK SURGERY       HX CORONARY ARTERY BYPASS GRAFT      HX MASTECTOMY, SIMPLE Bilateral     HX TUBAL LIGATION      NECK SURGERY           Allergies[1]    Social History  Social History[2]         Current Outpatient Medications   Medication Instructions    anastrozole  (ARIMIDEX ) 1 mg, Daily    aspirin  81 mg, Oral, Daily    atorvastatin  (LIPITOR) 40 mg, Oral, Daily    cholecalciferol  (vitamin D3) 1,000 Units, Daily    clonazePAM  (KLONOPIN ) 0.5 mg, 2 TIMES DAILY PRN    DULoxetine  (CYMBALTA  DR) 60 mg, Daily    fluticasone  propion-salmeteroL (ADVAIR ) 250-50 mcg/dose Inhalation oral diskus inhaler 1 Inhalation, Inhalation, 2 TIMES DAILY    fluticasone  propionate (FLONASE ) 50 mcg/actuation Nasal Spray, Suspension 1 Spray, DAILY PRN    gabapentin  (NEURONTIN ) 400 mg, Oral, 3 TIMES DAILY    losartan  (COZAAR ) 25 mg, Oral, Daily    metoprolol  tartrate (LOPRESSOR ) 25 mg, 2 TIMES DAILY    oxyCODONE -acetaminophen  (PERCOCET) 7.5-325 mg Oral Tablet 1 Tablet, Oral, EVERY 4  HOURS PRN    prochlorperazine  (COMPAZINE ) 10 mg, Oral, 3 TIMES DAILY PRN    promethazine  (PHENERGAN ) 25 mg, Oral, EVERY 8 HOURS PRN    traZODone  (DESYREL ) 150 mg, NIGHTLY        albuterol  (PROVENTIL ) 2.5 mg / 3 mL (0.083%) neb solution, 2.5 mg, Nebulization, 4x/day  aluminum-magnesium  hydroxide-simethicone (MAG-AL PLUS) 200-200-20 mg per 5 mL oral liquid, 30 mL, Oral, Q4H PRN  amiodarone  (NEXTERONE ) 360 mg in D5W 200mL premix infusion, 0.5 mg/min, Intravenous, Continuous  anastrozole  (ARIMIDEX ) tablet, 1 mg, Oral, Daily  budesonide (PULMICORT RESPULES) 0.5 mg/2 mL nebulizer suspension, 1 mg, Nebulization, 2x/day   And  arformoterol (BROVANA) 15 mcg/2 mL nebulizer solution, 15 mcg, Nebulization, 2x/day  [Held by provider] atorvastatin  (LIPITOR) tablet, 40 mg, Oral, Daily  chlorhexidine gluconate (PERIDEX) 0.12% mouthwash, 15 mL, Swish & Spit, 2x/day  cholecalciferol  (VITAMIN D3) 1000 unit (25 mcg) tablet, 1,000 Units, Oral, Daily  [Held by provider] clonazePAM  (klonoPIN ) tablet,  0.5 mg, Oral, 2x/day PRN  Correction/SSIP insulin  lispro 100 units/mL injection, 1-5 Units, Subcutaneous, Q4HRS  NS 250 mL flush bag, , Intravenous, Q15 Min PRN   And  D5W 250 mL flush bag, , Intravenous, Q15 Min PRN  dexmedeTOMIDine  (PRECEDEX ) 400 mcg in D5W 100 mL premix infusion, 0-1.5 mcg/kg/hr (Adjusted), Intravenous, Continuous  dextrose  (GLUTOSE) 40% oral gel, 15 g, Oral, Q15 Min PRN  dextrose  50% (0.5 g/mL) injection - syringe, 12.5 g, Intravenous, Q15 Min PRN  DULoxetine  (CYMBALTA ) delayed release capsule, 60 mg, Oral, Daily  EPINEPHrine  (ADRENALIN ) 10 mg in NS 250 mL infusion, 0-0.3 mcg/kg/min, Intravenous, Continuous  fentaNYL (SUBLIMAZE) 50 mcg/mL injection, 100 mcg, Intravenous, Once  fentaNYL (SUBLIMAZE) 50 mcg/mL injection, 50 mcg, Intravenous, Q15 Min PRN  fentaNYL (SUBLIMAZE) 50 mcg/mL IV infusion, 0-3 mcg/kg/hr (Adjusted), Intravenous, Continuous  folic acid  (FOLVITE ) tablet, 1 mg, Oral, Daily  furosemide  (LASIX ) 10 mg/mL injection, 40 mg, Intravenous, 2x/day  glucagon  (GLUCAGEN) injection 1 mg, 1 mg, IntraMUSCULAR, Once PRN  human prothrombin complex [facility]) 4 factor (KCENTRA) injection, 2,000 Units, Intravenous, Once  hydrocortisone (solu-CORTEF) 50 mg/mL injection, 50 mg, Intravenous, Q6H  [Held by provider] HYDROmorphone  (DILAUDID ) 0.5 mg/0.5 mL injection, 0.5 mg, Intravenous, Q4H PRN  [Held by provider] losartan  (COZAAR ) tablet, 25 mg, Oral, Daily  [Held by provider] metoprolol  tartrate (LOPRESSOR ) tablet, 25 mg, Oral, 2x/day  midazolam (VERSED) 1 mg/mL injection, 2 mg, Intravenous, Q1H PRN  multivitamin-minerals-iron  oral liquid, 15 mL, Oral, Daily  norepinephrine  (LEVOPHED ) 4 mg in D5W 250 mL premix infusion (16 mcg/mL), 0-0.3 mcg/kg/min, Intravenous, Continuous  NS bolus infusion 40 mL, 40 mL, Intravenous, Once PRN  NS flush syringe, 3 mL, Intracatheter, Q8HRS  NS flush syringe, 3 mL, Intracatheter, Q1H PRN  [Held by provider] OLANZapine  (zyPREXA ) tablet, 5 mg, Oral, NIGHTLY  omega-3 fatty  acids (LOVAZA ) capsule, 1 g, Oral, Daily  pantoprazole  (PROTONIX ) 4 mg/mL injection, 40 mg, Intravenous, Q12H  phenylephrine  (NEO-SYNEPHRINE) 100 mg in NS 250 mL infusion, 0-5 mcg/kg/min, Intravenous, Continuous  piperacillin-tazobactam (ZOSYN) 4.5 g in iso-osmotic 100 mL premix IVPB, 4.5 g, Intravenous, Q8H  polyethylene glycol (MIRALAX) oral packet, 17 g, Oral, Daily  vancomycin (VANCOCIN) 1,250 mg in NS 250 mL IVPB, 1,250 mg, Intravenous, Once  Vancomycin IV - Pharmacist to Dose per Protocol, , Does not apply, Daily PRN  Vancomycin IV Intermittent Dosing, , Does not apply, Daily PRN  vasopressin (VASOSTRICT) 20 Units in NS 100 mL (0.2 units/mL) infusion, 0.03 Units/min, Intravenous, Continuous        Review of Systems:  All pertinent review of systems as addressed and detailed in HPI above    Vital Signs:  Patient Vitals for the past 24 hrs:   Temp Pulse Resp SpO2 Weight   Jul 24, 2024 1100 37 C (98.6 F) (!) 155 (!) 22 -- --   07-24-2024 0926 -- 88 -- 93 % --   07/24/24 0900 36.7 C (98.1 F) 88 16 94 % --   24-Jul-2024 0800 36.6 C (97.9 F) 83 14 99 % --   07/24/2024 0746 -- 83 -- 96 % --   Jul 24, 2024 0700 36.5 C (97.7 F) 79 (!) 11 95 % --   24-Jul-2024 0645 36.5 C (97.7 F) 76 (!) 9 93 % --   2024-07-24 0630 36.4 C (97.5 F) 77 (!) 24 97 % --   07-24-2024 0615 36.5 C (97.7 F) 77 (!) 24 97 % --   24-Jul-2024 0600 36.5 C (97.7 F) 77 (!) 24 95 % 79.3 kg (174 lb 13.2 oz)   July 24, 2024 0545 36.7 C (98.1 F) 74 (!) 11 99 % --   07-24-2024 0530 36.6 C (97.9 F) 77 (!) 24 97 % --   2024-07-24 0515 36.5 C (97.7 F) 73 (!) 24 98 % --   24-Jul-2024 0500 36.6 C (97.9 F) 73 (!) 24 98 % --   07/24/24 0445 36.5 C (97.7 F) 72 (!) 21 99 % --   2024/07/24 0430 36.6 C (97.9 F) 73 (!) 24 99 % --   2024/07/24 0423 36.5 C (97.7 F) 72 (!) 24 98 % --   Jul 24, 2024 0415 36.6 C (97.9 F) 73 (!) 24 98 % --   07-24-24 0400 36.5 C (97.7 F) (!) 117 (!) 25 98 % --   07/24/2024 0345 36.5 C (97.7 F) (!) 119 14 98 % --   24-Jul-2024 0330 36.5 C (97.7 F) (!)  118 (!) 9 98 % --   07/24/2024 0315 36.5 C (97.7 F) 71 13 97 % --   07-24-2024 0307 36.5 C (97.7 F) (!) 118 (!) 27 97 % --   07-24-2024 0302 36.5 C (97.7 F) (!) 127 14 97 % --   July 24, 2024 0300 36.5 C (97.7 F) (!) 129 14 98 % --   24-Jul-2024 0257 36.5 C (97.7 F) 70 17 97 % --   24-Jul-2024 0252 36.5 C (97.7 F) (!) 124 (!) 2 98 % --   07-24-24 0245 36.5 C (97.7 F) (!) 116 (!) 0 98 % --   Jul 24, 2024 0230 36.5 C (97.7 F) 72 13 98 % --   07-24-24 0215 36.5 C (97.7 F) (!) 129 (!) 0 97 % --   July 24, 2024 0200 36.5 C (97.7 F) (!) 117 (!) 0 97 % --   07/24/24 0145 36.5 C (97.7 F) (!) 123 (!) 24 97 % --   07-24-24 0130 36.5 C (97.7 F) (!) 127 (!) 21 97 % --   July 24, 2024 0115 36.5 C (97.7 F) (!) 113 (!) 24 97 % --   Jul 24, 2024 0100 36.5 C (97.7 F) (!) 120 (!) 24 96 % --   07-24-2024 0045 36.5 C (97.7 F) (!) 115 (!) 24 96 % --   2024-07-24 0037 -- (!) 110 -- 96 % --   July 24, 2024 0030 36.5 C (97.7 F) (!) 120 (!) 27 96 % --   2024/07/24 0015 36.6 C (97.9 F) (!) 122 (!) 21 97 % --   07-24-24 0000 36.6 C (97.9 F) (!) 126 (!) 24 96 % --   07/18/24  2345 36.6 C (97.9 F) (!) 134 15 96 % --   07/18/24 2335 36.6 C (97.9 F) (!) 122 15 96 % --   07/18/24 2330 36.6 C (97.9 F) (!) 118 (!) 11 97 % --   07/18/24 2315 36.6 C (97.9 F) 81 (!) 8 97 % --   07/18/24 2300 36.6 C (97.9 F) 79 (!) 0 90 % --   07/18/24 2245 36.6 C (97.9 F) 77 (!) 21 95 % --   07/18/24 2240 36.6 C (97.9 F) 76 (!) 21 98 % --   07/18/24 2230 36.5 C (97.7 F) 77 (!) 24 99 % --   07/18/24 2215 36.6 C (97.9 F) 77 (!) 22 99 % --   07/18/24 2200 36.6 C (97.9 F) 77 (!) 24 99 % --   07/18/24 2148 36.7 C (98.1 F) 77 (!) 24 99 % --   07/18/24 2145 36.7 C (98.1 F) 76 (!) 24 99 % --   07/18/24 2140 36.7 C (98.1 F) 76 (!) 27 99 % --   07/18/24 2135 36.7 C (98.1 F) 77 (!) 23 99 % --   07/18/24 2130 36.8 C (98.2 F) 77 (!) 21 99 % --   07/18/24 2115 36.8 C (98.2 F) 78 20 99 % --   07/18/24 2100 36.9 C (98.4 F) 80 (!) 24 99 % --   07/18/24 2045 37.1  C (98.8 F) 83 (!) 24 99 % --   07/18/24 2030 37.2 C (99 F) 85 (!) 24 99 % --   07/18/24 2015 37.3 C (99.1 F) 88 19 96 % --   07/18/24 2009 -- 87 -- 93 % --   07/18/24 2000 37.4 C (99.3 F) 86 16 93 % --   07/18/24 1945 37.4 C (99.3 F) 84 (!) 24 94 % --   07/18/24 1930 37.4 C (99.3 F) 85 (!) 22 94 % --   07/18/24 1915 37.4 C (99.3 F) 87 (!) 25 95 % --   07/18/24 1900 37.3 C (99.1 F) 85 16 -- --   07/18/24 1845 37.4 C (99.3 F) 85 15 94 % --   07/18/24 1830 37.4 C (99.3 F) 85 (!) 11 95 % --   07/18/24 1815 37.4 C (99.3 F) 86 17 94 % --   07/18/24 1800 37.3 C (99.1 F) (!) 105 14 94 % --   07/18/24 1745 37.3 C (99.1 F) (!) 114 (!) 22 91 % --   07/18/24 1730 37.3 C (99.1 F) 86 (!) 21 93 % --   07/18/24 1725 37.4 C (99.3 F) 86 17 93 % --   07/18/24 1721 37.3 C (99.1 F) 86 (!) 23 93 % --   07/18/24 1720 37.3 C (99.1 F) 86 (!) 21 93 % --   07/18/24 1715 37.3 C (99.1 F) 85 15 93 % --   07/18/24 1700 37.3 C (99.1 F) 82 20 93 % --   07/18/24 1645 37.3 C (99.1 F) 79 18 92 % --   07/18/24 1635 37.3 C (99.1 F) 81 15 95 % --   07/18/24 1630 37.4 C (99.3 F) 81 (!) 22 94 % --   07/18/24 1626 37.4 C (99.3 F) 80 13 93 % --   07/18/24 1615 37.4 C (99.3 F) 80 19 95 % --   07/18/24 1601 -- -- -- 96 % --   07/18/24 1600 37.4 C (99.3 F) 84 15 94 % --   07/18/24 1545 37.4 C (99.3  F) 80 18 94 % --   07/18/24 1530 37.4 C (99.3 F) 80 18 94 % --   07/18/24 1515 37.4 C (99.3 F) 80 15 94 % --   07/18/24 1500 37.6 C (99.7 F) 79 14 94 % --   07/18/24 1445 37.4 C (99.3 F) 79 13 90 % --   07/18/24 1430 37.4 C (99.3 F) 78 15 95 % --   07/18/24 1415 37.3 C (99.1 F) 80 20 100 % --   07/18/24 1400 -- 81 17 92 % --   07/18/24 1345 -- 80 18 93 % --   07/18/24 1330 -- 79 20 93 % --   07/18/24 1315 -- 79 18 93 % --   07/18/24 1300 -- 79 (!) 21 93 % --   07/18/24 1245 -- 79 18 95 % --   07/18/24 1230 -- 80 20 (!) 89 % --   07/18/24 1215 -- 80 16 91 % --   07/18/24 1212 -- 81 20 93 % --         Physical Exam:  Physical Exam  Constitutional:       General: She is not in acute distress.     Comments: Mechanically ventilated and sedated   Eyes:      General: Scleral icterus present.   Cardiovascular:      Rate and Rhythm: Normal rate.   Pulmonary:      Comments: Mechanically ventilated  Abdominal:      Comments: Abdomen is mildly firm, distended, with very hypoactive bowel sounds.    Skin:     General: Skin is warm and dry.      Comments: No further bleeding noted from right lower quadrant trocar site, sutures clean dry and intact   Neurological:      Comments: Sedated     Studies:  I have reviewed all available studies within the electronic medical record.    Results for orders placed or performed during the hospital encounter of 07/08/24 (from the past 24 hours)   PRODUCT: PLATELETS - UNITS , 1 Units   Result Value Ref Range    Coding System [id number]     UNIT NUMBER [phone]     BLOOD COMPONENT TYPE PATHOGEN REDUCED PLASMA REDUCED2     UNIT DIVISION 00     UNIT DISPENSE STATUS ISSUED     TRANSFUSION STATUS OK TO TRANSFUSE     Product Code Z1657C99    CBC   Result Value Ref Range    WBC 6.5 3.7 - 11.0 x10^3/uL    RBC 3.19 (L) 3.85 - 5.22 x10^6/uL    HGB 9.2 (L) 11.5 - 16.0 g/dL    HCT 71.6 (L) 65.1 - 46.0 %    MCV 88.7 78.0 - 100.0 fL    MCH 28.8 26.0 - 32.0 pg    MCHC 32.5 31.0 - 35.5 g/dL    RDW-CV 83.4 (H) 88.4 - 15.5 %    PLATELETS 34 (L) 150 - 400 x10^3/uL   COMPREHENSIVE METABOLIC PANEL, NON-FASTING   Result Value Ref Range    SODIUM 148 (H) 133 - 144 mmol/L    POTASSIUM 3.6 3.2 - 5.0 mmol/L    CHLORIDE 102 96 - 106 mmol/L    CO2 TOTAL 26 22 - 30 mmol/L    ANION GAP 20 (H) 7 - 18 mmol/L    BUN 64 (H) 8 - 23 mg/dL    CREATININE 8.30 (H) 0.50 - 0.90 mg/dL    ESTIMATED  GFR 33 (L) >90 mL/min/1.53m^2    ALBUMIN 3.2 (L) 3.5 - 5.2 g/dL    CALCIUM 9.0 8.3 - 89.2 mg/dL    GLUCOSE 815 (H) 74 - 109 mg/dL    ALKALINE PHOSPHATASE 279 (H) 35 - 129 U/L    ALT (SGPT) 1,116 (H) 0 - 33 U/L    AST (SGOT) 2,743  (H) 0 - 32 U/L    BILIRUBIN TOTAL 7.5 (H) 0.2 - 1.2 mg/dL    PROTEIN TOTAL 5.4 (L) 6.4 - 8.3 g/dL   IONIZED CALCIUM WITH PH   Result Value Ref Range    PH (VENOUS) 7.36 7.32 - 7.43    IONIZED CALCIUM 1.13 (L) 1.15 - 1.33 mmol/L   MAGNESIUM    Result Value Ref Range    MAGNESIUM  2.1 1.6 - 2.4 mg/dL   PHOSPHORUS   Result Value Ref Range    PHOSPHORUS 4.6 (H) 2.5 - 4.5 mg/dL   PT/INR   Result Value Ref Range    PROTHROMBIN TIME 22.3 (H) 12.1 - 15.3 seconds    INR 1.94 (H) 0.86 - 1.14   PTT (PARTIAL THROMBOPLASTIN TIME)   Result Value Ref Range    APTT 46.1 (H) 22.4 - 34.8 seconds   BLOOD GAS W/ CO-OX, LYTES, LACTATE REFLEX Arterial   Result Value Ref Range    %FIO2 (ARTERIAL) 45 %    PH (ARTERIAL) 7.33 (L) 7.35 - 7.45    PCO2 (ARTERIAL) 57 (H) 35 - 45 mm/Hg    PO2 (ARTERIAL) 74 (L) 83 - 108 mm/Hg    BICARBONATE (ARTERIAL) 27.4 21.0 - 28.0 mmol/L    BASE EXCESS (ARTERIAL) 3.2 (H) 0.0 - 3.0 mmol/L    PAO2/FIO2 RATIO 164     O2 SATURATION (ARTERIAL) 93.6 94.0 - 98.0 %    HEMOGLOBIN 10.2 (L) 12.0 - 18.0 g/dL    HEMATOCRITRT 31 (L) 37 - 50 %    OXYHEMOGLOBIN 94.5 90.0 - 95.0 %    CARBOXYHEMOGLOBIN 1.7 <=3.0 %    MET-HEMOGLOBIN <0.7 <=1.5 %    O2CT 13.6 %    SODIUM 143 136 - 145 mmol/L    WHOLE BLOOD POTASSIUM 3.5 3.5 - 5.1 mmol/L    CHLORIDE 102 98 - 107 mmol/L    IONIZED CALCIUM 1.15 1.15 - 1.33 mmol/L    GLUCOSE 169 (H) 65 - 125 mg/dL    LACTATE 3.5 (H) <=8.0 mmol/L   PRODUCT: FFP/PLASMA - UNITS : 2 Units   Result Value Ref Range    Coding System [id number]     UNIT NUMBER [id number]     BLOOD COMPONENT TYPE THAWED PLASMA     UNIT DIVISION 00     UNIT DISPENSE STATUS ISSUED     TRANSFUSION STATUS OK TO TRANSFUSE     Product Code E2684V00     Coding System ISBT128     UNIT NUMBER [id number]     BLOOD COMPONENT TYPE THAWED PLASMA     UNIT DIVISION 00     UNIT DISPENSE STATUS ISSUED     TRANSFUSION STATUS OK TO TRANSFUSE     Product Code Z7315C99    POC BLOOD GLUCOSE (RESULTS)   Result Value Ref Range    GLUCOSE, POC 186  (H) 70 - 100 mg/dl   CBC   Result Value Ref Range    WBC 6.0 3.7 - 11.0 x10^3/uL    RBC 2.70 (L) 3.85 - 5.22 x10^6/uL    HGB 7.6 (L) 11.5 - 16.0 g/dL    HCT  23.8 (L) 34.8 - 46.0 %    MCV 88.1 78.0 - 100.0 fL    MCH 28.1 26.0 - 32.0 pg    MCHC 31.9 31.0 - 35.5 g/dL    RDW-CV 83.1 (H) 88.4 - 15.5 %    PLATELETS 40 (L) 150 - 400 x10^3/uL   COMPREHENSIVE METABOLIC PANEL, NON-FASTING   Result Value Ref Range    SODIUM 150 (H) 133 - 144 mmol/L    POTASSIUM 3.6 3.2 - 5.0 mmol/L    CHLORIDE 102 96 - 106 mmol/L    CO2 TOTAL 27 22 - 30 mmol/L    ANION GAP 21 (H) 7 - 18 mmol/L    BUN 65 (H) 8 - 23 mg/dL    CREATININE 8.15 (H) 0.50 - 0.90 mg/dL    ESTIMATED GFR 30 (L) >90 mL/min/1.39m^2    ALBUMIN 3.2 (L) 3.5 - 5.2 g/dL    CALCIUM 8.9 8.3 - 89.2 mg/dL    GLUCOSE 822 (H) 74 - 109 mg/dL    ALKALINE PHOSPHATASE 257 (H) 35 - 129 U/L    ALT (SGPT) 983 (H) 0 - 33 U/L    AST (SGOT) 2,355 (H) 0 - 32 U/L    BILIRUBIN TOTAL 7.3 (H) 0.2 - 1.2 mg/dL    PROTEIN TOTAL 5.5 (L) 6.4 - 8.3 g/dL   IONIZED CALCIUM WITH PH   Result Value Ref Range    PH (VENOUS) 7.28 (L) 7.32 - 7.43    IONIZED CALCIUM 1.13 (L) 1.15 - 1.33 mmol/L   MAGNESIUM    Result Value Ref Range    MAGNESIUM  2.1 1.6 - 2.4 mg/dL   PHOSPHORUS   Result Value Ref Range    PHOSPHORUS 5.2 (H) 2.5 - 4.5 mg/dL   PT/INR   Result Value Ref Range    PROTHROMBIN TIME 21.0 (H) 12.1 - 15.3 seconds    INR 1.79 (H) 0.86 - 1.14   PTT (PARTIAL THROMBOPLASTIN TIME)   Result Value Ref Range    APTT 43.4 (H) 22.4 - 34.8 seconds   BLOOD GAS W/ CO-OX, LYTES, LACTATE REFLEX Arterial   Result Value Ref Range    %FIO2 (ARTERIAL) 50 %    PH (ARTERIAL) 7.25 (L) 7.35 - 7.45    PCO2 (ARTERIAL) 64 (H) 35 - 45 mm/Hg    PO2 (ARTERIAL) 80 (L) 83 - 108 mm/Hg    BICARBONATE (ARTERIAL) 24.9 21.0 - 28.0 mmol/L    BASE EXCESS (ARTERIAL) 0.0 0.0 - 3.0 mmol/L    PAO2/FIO2 RATIO 160     O2 SATURATION (ARTERIAL) 93.6 94.0 - 98.0 %    HEMOGLOBIN 10.1 (L) 12.0 - 18.0 g/dL    HEMATOCRITRT 30 (L) 37 - 50 %    OXYHEMOGLOBIN  94.1 90.0 - 95.0 %    CARBOXYHEMOGLOBIN 1.4 <=3.0 %    MET-HEMOGLOBIN 0.8 <=1.5 %    O2CT 13.5 %    SODIUM 144 136 - 145 mmol/L    WHOLE BLOOD POTASSIUM 3.4 (L) 3.5 - 5.1 mmol/L    CHLORIDE 103 98 - 107 mmol/L    IONIZED CALCIUM 1.10 (L) 1.15 - 1.33 mmol/L    GLUCOSE 156 (H) 65 - 125 mg/dL    LACTATE 4.9 (HH) <=8.0 mmol/L   BLOOD GAS Arterial   Result Value Ref Range    %FIO2 (ARTERIAL) 40 %    PH (ARTERIAL) 7.35 7.35 - 7.45    PCO2 (ARTERIAL) 46 (H) 35 - 45 mm/Hg    PO2 (ARTERIAL) 138 (H) 83 - 108 mm/Hg  BICARBONATE (ARTERIAL) 24.5 21.0 - 28.0 mmol/L    BASE DEFICIT 0.6 0.0 - 3.0 mmol/L    PAO2/FIO2 RATIO 345     O2 SATURATION (ARTERIAL) 99.0 94.0 - 98.0 %   POC BLOOD GLUCOSE (RESULTS)   Result Value Ref Range    GLUCOSE, POC 193 (H) 70 - 100 mg/dl   CBC   Result Value Ref Range    WBC 5.1 3.7 - 11.0 x10^3/uL    RBC 2.86 (L) 3.85 - 5.22 x10^6/uL    HGB 8.3 (L) 11.5 - 16.0 g/dL    HCT 73.6 (L) 65.1 - 46.0 %    MCV 92.0 78.0 - 100.0 fL    MCH 29.0 26.0 - 32.0 pg    MCHC 31.6 31.0 - 35.5 g/dL    RDW-CV 83.5 (H) 88.4 - 15.5 %    PLATELETS 31 (L) 150 - 400 x10^3/uL    MPV 9.4 8.7 - 12.5 fL   COMPREHENSIVE METABOLIC PANEL, NON-FASTING   Result Value Ref Range    SODIUM 151 (H) 133 - 144 mmol/L    POTASSIUM 3.6 3.2 - 5.0 mmol/L    CHLORIDE 102 96 - 106 mmol/L    CO2 TOTAL 23 22 - 30 mmol/L    ANION GAP 26 (H) 7 - 18 mmol/L    BUN 67 (H) 8 - 23 mg/dL    CREATININE 8.01 (H) 0.50 - 0.90 mg/dL    ESTIMATED GFR 27 (L) >90 mL/min/1.17m^2    ALBUMIN 3.0 (L) 3.5 - 5.2 g/dL    CALCIUM 9.5 8.3 - 89.2 mg/dL    GLUCOSE 826 (H) 74 - 109 mg/dL    ALKALINE PHOSPHATASE 243 (H) 35 - 129 U/L    ALT (SGPT) 987 (H) 0 - 33 U/L    AST (SGOT) 2,330 (H) 0 - 32 U/L    BILIRUBIN TOTAL 7.5 (H) 0.2 - 1.2 mg/dL    PROTEIN TOTAL 5.1 (L) 6.4 - 8.3 g/dL   IONIZED CALCIUM WITH PH   Result Value Ref Range    PH (VENOUS) 7.40 7.32 - 7.43    IONIZED CALCIUM 1.15 1.15 - 1.33 mmol/L   MAGNESIUM    Result Value Ref Range    MAGNESIUM  2.1 1.6 - 2.4 mg/dL    PHOSPHORUS   Result Value Ref Range    PHOSPHORUS 4.9 (H) 2.5 - 4.5 mg/dL   PT/INR   Result Value Ref Range    PROTHROMBIN TIME 22.4 (H) 12.1 - 15.3 seconds    INR 1.95 (H) 0.86 - 1.14   PTT (PARTIAL THROMBOPLASTIN TIME)   Result Value Ref Range    APTT 46.0 (H) 22.4 - 34.8 seconds   BLOOD GAS W/ CO-OX, LYTES, LACTATE REFLEX Arterial   Result Value Ref Range    %FIO2 (ARTERIAL) 50 %    PH (ARTERIAL) 7.33 (L) 7.35 - 7.45    PCO2 (ARTERIAL) 47 (H) 35 - 45 mm/Hg    PO2 (ARTERIAL) 97 83 - 108 mm/Hg    BICARBONATE (ARTERIAL) 24.0 21.0 - 28.0 mmol/L    BASE DEFICIT 1.2 0.0 - 3.0 mmol/L    PAO2/FIO2 RATIO 194     O2 SATURATION (ARTERIAL) 97.0 94.0 - 98.0 %    HEMOGLOBIN 8.8 (L) 12.0 - 18.0 g/dL    HEMATOCRITRT 26 (L) 37 - 50 %    OXYHEMOGLOBIN 96.8 (H) 90.0 - 95.0 %    CARBOXYHEMOGLOBIN 1.0 <=3.0 %    MET-HEMOGLOBIN <0.7 <=1.5 %    O2CT 12.1 %  SODIUM 145 136 - 145 mmol/L    WHOLE BLOOD POTASSIUM 3.5 3.5 - 5.1 mmol/L    CHLORIDE 101 98 - 107 mmol/L    IONIZED CALCIUM 1.23 1.15 - 1.33 mmol/L    GLUCOSE 161 (H) 65 - 125 mg/dL    LACTATE 8.3 (HH) <=8.0 mmol/L   PRODUCT: FFP/PLASMA - UNITS : 2 Units   Result Value Ref Range    Coding System [id number]     UNIT NUMBER [id number]     BLOOD COMPONENT TYPE THAWED PLASMA     UNIT DIVISION 00     UNIT DISPENSE STATUS ISSUED     TRANSFUSION STATUS OK TO TRANSFUSE     Product Code [id number]     Coding System [id number]     UNIT NUMBER [id number]     BLOOD COMPONENT TYPE THAWED PLASMA     UNIT DIVISION 00     UNIT DISPENSE STATUS ISSUED     TRANSFUSION STATUS OK TO TRANSFUSE     Product Code [id number]    CBC   Result Value Ref Range    WBC 5.8 3.7 - 11.0 x10^3/uL    RBC 2.62 (L) 3.85 - 5.22 x10^6/uL    HGB 7.5 (L) 11.5 - 16.0 g/dL    HCT 76.5 (L) 65.1 - 46.0 %    MCV 89.3 78.0 - 100.0 fL    MCH 28.6 26.0 - 32.0 pg    MCHC 32.1 31.0 - 35.5 g/dL    RDW-CV 83.5 (H) 88.4 - 15.5 %    PLATELETS 34 (L) 150 - 400 x10^3/uL    MPV 10.6 8.7 - 12.5 fL   COMPREHENSIVE METABOLIC PANEL, NON-FASTING    Result Value Ref Range    SODIUM 150 (H) 133 - 144 mmol/L    POTASSIUM 3.5 3.2 - 5.0 mmol/L    CHLORIDE 101 96 - 106 mmol/L    CO2 TOTAL 22 22 - 30 mmol/L    ANION GAP 27 (H) 7 - 18 mmol/L    BUN 69 (H) 8 - 23 mg/dL    CREATININE 7.83 (H) 0.50 - 0.90 mg/dL    ESTIMATED GFR 25 (L) >90 mL/min/1.22m^2    ALBUMIN 3.1 (L) 3.5 - 5.2 g/dL    CALCIUM 9.4 8.3 - 89.2 mg/dL    GLUCOSE 871 (H) 74 - 109 mg/dL    ALKALINE PHOSPHATASE 238 (H) 35 - 129 U/L    ALT (SGPT) 950 (H) 0 - 33 U/L    AST (SGOT) 2,194 (H) 0 - 32 U/L    BILIRUBIN TOTAL 7.7 (H) 0.2 - 1.2 mg/dL    PROTEIN TOTAL 5.2 (L) 6.4 - 8.3 g/dL   IONIZED CALCIUM WITH PH   Result Value Ref Range    PH (VENOUS) 7.31 (L) 7.32 - 7.43    IONIZED CALCIUM 1.15 1.15 - 1.33 mmol/L   MAGNESIUM    Result Value Ref Range    MAGNESIUM  2.2 1.6 - 2.4 mg/dL   PHOSPHORUS   Result Value Ref Range    PHOSPHORUS 4.9 (H) 2.5 - 4.5 mg/dL   PT/INR   Result Value Ref Range    PROTHROMBIN TIME 22.0 (H) 12.1 - 15.3 seconds    INR 1.91 (H) 0.86 - 1.14   PTT (PARTIAL THROMBOPLASTIN TIME)   Result Value Ref Range    APTT 43.8 (H) 22.4 - 34.8 seconds   BLOOD GAS W/ CO-OX, LYTES, LACTATE REFLEX Arterial   Result Value Ref Range    %FIO2 (ARTERIAL) 50 %  PH (ARTERIAL) 7.30 (L) 7.35 - 7.45    PCO2 (ARTERIAL) 44 35 - 45 mm/Hg    PO2 (ARTERIAL) 109 (H) 83 - 108 mm/Hg    BICARBONATE (ARTERIAL) 21.4 21.0 - 28.0 mmol/L    BASE DEFICIT 4.5 (H) 0.0 - 3.0 mmol/L    PAO2/FIO2 RATIO 218     O2 SATURATION (ARTERIAL) 97.7 94.0 - 98.0 %    HEMOGLOBIN 8.1 (L) 12.0 - 18.0 g/dL    HEMATOCRITRT 24 (L) 37 - 50 %    OXYHEMOGLOBIN 96.9 (H) 90.0 - 95.0 %    CARBOXYHEMOGLOBIN 1.5 <=3.0 %    MET-HEMOGLOBIN 0.9 <=1.5 %    O2CT 11.3 %    SODIUM 145 136 - 145 mmol/L    WHOLE BLOOD POTASSIUM 3.4 (L) 3.5 - 5.1 mmol/L    CHLORIDE 102 98 - 107 mmol/L    IONIZED CALCIUM 1.19 1.15 - 1.33 mmol/L    GLUCOSE 112 65 - 125 mg/dL    LACTATE 9.7 (HH) <=8.0 mmol/L   CBC   Result Value Ref Range    WBC 6.1 3.7 - 11.0 x10^3/uL    RBC 2.57 (L)  3.85 - 5.22 x10^6/uL    HGB 7.4 (L) 11.5 - 16.0 g/dL    HCT 76.2 (L) 65.1 - 46.0 %    MCV 92.2 78.0 - 100.0 fL    MCH 28.8 26.0 - 32.0 pg    MCHC 31.2 31.0 - 35.5 g/dL    RDW-CV 82.9 (H) 88.4 - 15.5 %    PLATELETS 32 (L) 150 - 400 x10^3/uL   COMPREHENSIVE METABOLIC PANEL, NON-FASTING   Result Value Ref Range    SODIUM 151 (H) 133 - 144 mmol/L    POTASSIUM 3.7 3.2 - 5.0 mmol/L    CHLORIDE 100 96 - 106 mmol/L    CO2 TOTAL 22 22 - 30 mmol/L    ANION GAP 29 (H) 7 - 18 mmol/L    BUN 70 (H) 8 - 23 mg/dL    CREATININE 7.75 (H) 0.50 - 0.90 mg/dL    ESTIMATED GFR 23 (L) >90 mL/min/1.59m^2    ALBUMIN 3.2 (L) 3.5 - 5.2 g/dL    CALCIUM 9.4 8.3 - 89.2 mg/dL    GLUCOSE 882 (H) 74 - 109 mg/dL    ALKALINE PHOSPHATASE 236 (H) 35 - 129 U/L    ALT (SGPT) 971 (H) 0 - 33 U/L    AST (SGOT) 2,228 (H) 0 - 32 U/L    BILIRUBIN TOTAL 8.2 (H) 0.2 - 1.2 mg/dL    PROTEIN TOTAL 5.3 (L) 6.4 - 8.3 g/dL   IONIZED CALCIUM WITH PH   Result Value Ref Range    PH (VENOUS) 7.39 7.32 - 7.43    IONIZED CALCIUM 1.10 (L) 1.15 - 1.33 mmol/L   MAGNESIUM    Result Value Ref Range    MAGNESIUM  2.2 1.6 - 2.4 mg/dL   PHOSPHORUS   Result Value Ref Range    PHOSPHORUS 5.3 (H) 2.5 - 4.5 mg/dL   PT/INR   Result Value Ref Range    PROTHROMBIN TIME 21.6 (H) 12.1 - 15.3 seconds    INR 1.86 (H) 0.86 - 1.14   PTT (PARTIAL THROMBOPLASTIN TIME)   Result Value Ref Range    APTT 44.7 (H) 22.4 - 34.8 seconds   VANCOMYCIN, RANDOM   Result Value Ref Range    VANCOMYCIN RANDOM 17.8 See Comment ug/mL   POC BLOOD GLUCOSE (RESULTS)   Result Value Ref Range    GLUCOSE, POC  119 (H) 70 - 100 mg/dl   BLOOD GAS W/ CO-OX, LYTES, LACTATE REFLEX Arterial   Result Value Ref Range    %FIO2 (ARTERIAL) 40 %    PH (ARTERIAL) 7.16 (LL) 7.35 - 7.45    PCO2 (ARTERIAL) 48 (H) 35 - 45 mm/Hg    PO2 (ARTERIAL) 78 (L) 83 - 108 mm/Hg    BICARBONATE (ARTERIAL) 16.5 (L) 21.0 - 28.0 mmol/L    BASE DEFICIT 10.8 (H) 0.0 - 3.0 mmol/L    PAO2/FIO2 RATIO 195     O2 SATURATION (ARTERIAL) 91.0 94.0 - 98.0 %     HEMOGLOBIN 7.5 (L) 12.0 - 18.0 g/dL    HEMATOCRITRT 23 (L) 37 - 50 %    OXYHEMOGLOBIN 96.3 (H) 90.0 - 95.0 %    CARBOXYHEMOGLOBIN 2.5 <=3.0 %    MET-HEMOGLOBIN <0.7 <=1.5 %    O2CT 10.3 %    SODIUM 146 (H) 136 - 145 mmol/L    WHOLE BLOOD POTASSIUM 3.9 3.5 - 5.1 mmol/L    CHLORIDE 102 98 - 107 mmol/L    IONIZED CALCIUM 1.19 1.15 - 1.33 mmol/L    GLUCOSE 101 65 - 125 mg/dL    LACTATE 83.7 (HH) <=8.0 mmol/L   IONIZED CALCIUM WITH PH   Result Value Ref Range    PH (VENOUS) 7.27 (L) 7.32 - 7.43    IONIZED CALCIUM 1.06 (L) 1.15 - 1.33 mmol/L   BLOOD GAS W/ CO-OX, LYTES, LACTATE REFLEX Arterial   Result Value Ref Range    %FIO2 (ARTERIAL) 21 %    PH (ARTERIAL) 7.19 (LL) 7.35 - 7.45    PCO2 (ARTERIAL) 47 (H) 35 - 45 mm/Hg    PO2 (ARTERIAL) 79 (L) 83 - 108 mm/Hg    BICARBONATE (ARTERIAL) 17.5 (L) 21.0 - 28.0 mmol/L    BASE DEFICIT 9.5 (H) 0.0 - 3.0 mmol/L    PAO2/FIO2 RATIO 376     O2 SATURATION (ARTERIAL) 92.0 94.0 - 98.0 %    HEMOGLOBIN 6.9 (LL) 12.0 - 18.0 g/dL    HEMATOCRITRT 21 (L) 37 - 50 %    OXYHEMOGLOBIN 96.8 (H) 90.0 - 95.0 %    CARBOXYHEMOGLOBIN 3.2 (H) <=3.0 %    MET-HEMOGLOBIN <0.7 <=1.5 %    O2CT 9.5 %    SODIUM 148 (H) 136 - 145 mmol/L    WHOLE BLOOD POTASSIUM 4.0 3.5 - 5.1 mmol/L    CHLORIDE 101 98 - 107 mmol/L    IONIZED CALCIUM 1.13 (L) 1.15 - 1.33 mmol/L    GLUCOSE 95 65 - 125 mg/dL    LACTATE >82.9 (HH) <=8.0 mmol/L   PT/INR   Result Value Ref Range    PROTHROMBIN TIME 23.1 (H) 12.1 - 15.3 seconds    INR 2.03 (H) 0.86 - 1.14   PTT (PARTIAL THROMBOPLASTIN TIME)   Result Value Ref Range    APTT 47.4 (H) 22.4 - 34.8 seconds     XR AP MOBILE CHEST  Result Date: 07/18/2024  Impression Support apparatus. Increasing bilateral consolidation. . . . s/d/g Radiologist location ID: [id number]        Assessment & Plan:  Active Hospital Problems    Diagnosis    Primary Problem: Acute hypoxic respiratory failure (CMS HCC)    Moderate protein-calorie malnutrition (CMS HCC)    Metastatic cancer to liver    Elevated LFTs     Hemoperitoneum    Bile leak    Subdural hematoma (CMS HCC)    NSVT (nonsustained ventricular tachycardia) (CMS HCC)  Heart failure, unspecified HF chronicity, unspecified heart failure type (CMS HCC)    Acute cholecystitis    Thickening of wall of gallbladder with pericholecystic fluid    Symptomatic anemia    COVID-19    Acute blood loss anemia    Transaminitis    Malignant neoplasm of lung, unspecified laterality, unspecified part of lung (CMS HCC)    Pulmonary HTN (CMS HCC)    Pneumonia due to COVID-19 virus    Mood disorder (CMS HCC)    Anemia, unspecified type    Cigarette smoker    Metastasis to liver    History of breast cancer    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  Hemoglobin currently 7.4.  She did receive some blood yesterday.  [name] still is thrombocytopenic at platelets 32.  INR 2.03 bilirubin 8.2 today.    CT scan this morning of the brain did show some interval worsening of the subdural though it was subtle.    For now any surgical intervention will be potentially catastrophic for the patient.  As of now, there is not much we could offer surgically for this patient.    We will continue to follow.    This chart was partially dictated by [employer], a voice recognition software.  It may contain errors or words that were not intended to be used.  Please contact the provider for errors or clarifications.    /Aston Lieske JINNY Raider MD  07-28-24 12:00   Department of Surgery            [1] No Known Allergies  [2]   Social History  Tobacco Use    Smoking status: Every Day     Current packs/day: 0.50     Average packs/day: 0.5 packs/day for 52.8 years (26.4 ttl pk-yrs)     Types: Cigarettes     Start date: 1973    Smokeless tobacco: Never   Vaping Use    Vaping status: Never Used   Substance Use Topics    Alcohol use: Yes     Comment:  occasional    Drug use: Yes     Types: Marijuana

## 2024-08-10 NOTE — Nurses Notes (Signed)
 Jess from [facility] contacted at this time regarding patient not being a candidate for corneal donation at this time.    Lamichael Youkhana Buddie, Charity fundraiser

## 2024-08-10 NOTE — Progress Notes (Signed)
 [facility]       Intensive Care Progress Note    Name: [name], [name]  Date of Birth:  [birth year]  MRN:  [id number]  Date of Admission:  07/08/2024    Primary Care Provider: Garvin Lemmings, FNP       Date of Service: 12-Aug-2024    History of Present Illness: [name] is a 67 y.o., White female w/recently diagnosed Small-cell carcinoma w/liver mets, COPD, current smoker - not on O2 at home, CABG. She was found to have lung mass on last admission and underwent bronchoscopy w/bx of station 7 which revealed the small cell carcinoma. She came to hospital from outpatient infusion center to start chemotherapy but had dyspnea and hypoxia and was sent to ER for further evaluation. She was found to have COVID. She reports she's been feeling bad for the last few weeks. CTA chest - no PE, marginal increase in size of right infrahilar mass, right hilar and mediastinal lymphadenopathy compared to prior study. CT A/P: hepatomegaly, hepatic steatosis, and hepatic lesions not much change, new pericholecystic fluid now present. We are consulted for acute hypoxic respiratory failure, known patient.      [name] currently on 2 L NC/93%.  She is awake and alert.  Feels breathing is doing okay at present time.  Has some mild cough.  No chest pain.  No fevers noted.  No N/V/D.  No associated symptoms or modifying factors.      10/2: Patient on 3LNC/96%. Awake and alert. Feels breathing is improving. She is s/p lap chole. No chest pain. No fevers noted. No n/v/d.  No associated symptoms or modifying factors.      10/3: Patient on room air/97%. Denies shortness of breath. Not much cough. No chest pain. Having bowel movements. No obvious signs of bleeding. Hgb 6.2 this morning, getting blood transfusion.  No associated symptoms or modifying factors.      10/4:  Today on 3L NC.  Received 2 units PRBCs yesterday.  Hemoglobin up to 8.1 today.  Receiving breathing treatment on exam.  Patient states she is not  feeling much better today.     10/5:  On 2 L NC.  Hemoglobin dropped again this morning so had to get 1 unit PRBC.      10/6: Patient on 2LNC/92%. Fell last night. Moved to ICU for confusion and found to have small volume of acute hemorrhage in subdural space overlying the left frontal lobe anteriorly and superiorly. She is awake and alert. A/Ox 2, oriented to person, place. Daughter at bedside. Denies blurry vision. MAE x4 w/no weakness.  No associated symptoms or modifying factors.      10/7 - did well overnight.  No new issues reported.  Hemoglobin 6.5 and getting PRBC x1.  No bleeding noted.  CT head today shows small stable SDH.  Afebrile.  UIP adequate.  On 3 L/min supplemental oxygen.     10/8: Patient decompensated overnight requiring emergent intubation, initially maxed on 4 pressors, getting 2u PRBC and 2 FFP. HPI/ROS unable to be obtained d/t intubation and sedation.     10/9 - remains sedated and intubated on vent.  Titrating pressors. Developed Afib with RVR and NSVT. Started on amio drip.  T-max 38.3.  UO P adequate. Hb 6.9 and platelets 34 and is getting PRBC/FFP/platelets.    10/10 on vent/sedated remains on high-dose IV vasopressors.  PH 7.16 pCO2 48 this a.m.    ROS:   Review of systems  negative except noted in HPI    Exam:   Patient Vitals for the past 24 hrs:   Temp Pulse Resp SpO2 Weight   2024-07-23 0926 -- 88 -- 93 % --   07/23/2024 0900 36.7 C (98.1 F) 88 16 94 % --   2024-07-23 0800 36.6 C (97.9 F) 83 14 99 % --   07/23/24 0746 -- 83 -- 96 % --   23-Jul-2024 0700 36.5 C (97.7 F) 79 (!) 11 95 % --   2024-07-23 0645 36.5 C (97.7 F) 76 (!) 9 93 % --   23-Jul-2024 0630 36.4 C (97.5 F) 77 (!) 24 97 % --   07-23-24 0615 36.5 C (97.7 F) 77 (!) 24 97 % --   07/23/24 0600 36.5 C (97.7 F) 77 (!) 24 95 % 79.3 kg (174 lb 13.2 oz)   07/23/24 0545 36.7 C (98.1 F) 74 (!) 11 99 % --   Jul 23, 2024 0530 36.6 C (97.9 F) 77 (!) 24 97 % --   07/23/2024 0515 36.5 C (97.7 F) 73 (!) 24 98 % --   23-Jul-2024 0500  36.6 C (97.9 F) 73 (!) 24 98 % --   07/23/24 0445 36.5 C (97.7 F) 72 (!) 21 99 % --   23-Jul-2024 0430 36.6 C (97.9 F) 73 (!) 24 99 % --   23-Jul-2024 0423 36.5 C (97.7 F) 72 (!) 24 98 % --   2024/07/23 0415 36.6 C (97.9 F) 73 (!) 24 98 % --   2024/07/23 0400 36.5 C (97.7 F) (!) 117 (!) 25 98 % --   23-Jul-2024 0345 36.5 C (97.7 F) (!) 119 14 98 % --   2024-07-23 0330 36.5 C (97.7 F) (!) 118 (!) 9 98 % --   2024-07-23 0315 36.5 C (97.7 F) 71 13 97 % --   07-23-24 0307 36.5 C (97.7 F) (!) 118 (!) 27 97 % --   07-23-24 0302 36.5 C (97.7 F) (!) 127 14 97 % --   23-Jul-2024 0300 36.5 C (97.7 F) (!) 129 14 98 % --   07/23/2024 0257 36.5 C (97.7 F) 70 17 97 % --   07-23-24 0252 36.5 C (97.7 F) (!) 124 (!) 2 98 % --   07-23-24 0245 36.5 C (97.7 F) (!) 116 (!) 0 98 % --   07-23-24 0230 36.5 C (97.7 F) 72 13 98 % --   07-23-24 0215 36.5 C (97.7 F) (!) 129 (!) 0 97 % --   07-23-2024 0200 36.5 C (97.7 F) (!) 117 (!) 0 97 % --   07/23/24 0145 36.5 C (97.7 F) (!) 123 (!) 24 97 % --   07/23/24 0130 36.5 C (97.7 F) (!) 127 (!) 21 97 % --   07/23/2024 0115 36.5 C (97.7 F) (!) 113 (!) 24 97 % --   23-Jul-2024 0100 36.5 C (97.7 F) (!) 120 (!) 24 96 % --   07-23-2024 0045 36.5 C (97.7 F) (!) 115 (!) 24 96 % --   2024-07-23 0037 -- (!) 110 -- 96 % --   07-23-24 0030 36.5 C (97.7 F) (!) 120 (!) 27 96 % --   2024-07-23 0015 36.6 C (97.9 F) (!) 122 (!) 21 97 % --   07-23-2024 0000 36.6 C (97.9 F) (!) 126 (!) 24 96 % --   07/18/24 2345 36.6 C (97.9 F) (!) 134 15 96 % --   07/18/24 2335 36.6 C (97.9 F) (!) 122  15 96 % --   07/18/24 2330 36.6 C (97.9 F) (!) 118 (!) 11 97 % --   07/18/24 2315 36.6 C (97.9 F) 81 (!) 8 97 % --   07/18/24 2300 36.6 C (97.9 F) 79 (!) 0 90 % --   07/18/24 2245 36.6 C (97.9 F) 77 (!) 21 95 % --   07/18/24 2240 36.6 C (97.9 F) 76 (!) 21 98 % --   07/18/24 2230 36.5 C (97.7 F) 77 (!) 24 99 % --   07/18/24 2215 36.6 C (97.9 F) 77 (!) 22 99 % --   07/18/24 2200 36.6 C (97.9 F) 77 (!)  24 99 % --   07/18/24 2148 36.7 C (98.1 F) 77 (!) 24 99 % --   07/18/24 2145 36.7 C (98.1 F) 76 (!) 24 99 % --   07/18/24 2140 36.7 C (98.1 F) 76 (!) 27 99 % --   07/18/24 2135 36.7 C (98.1 F) 77 (!) 23 99 % --   07/18/24 2130 36.8 C (98.2 F) 77 (!) 21 99 % --   07/18/24 2115 36.8 C (98.2 F) 78 20 99 % --   07/18/24 2100 36.9 C (98.4 F) 80 (!) 24 99 % --   07/18/24 2045 37.1 C (98.8 F) 83 (!) 24 99 % --   07/18/24 2030 37.2 C (99 F) 85 (!) 24 99 % --   07/18/24 2015 37.3 C (99.1 F) 88 19 96 % --   07/18/24 2009 -- 87 -- 93 % --   07/18/24 2000 37.4 C (99.3 F) 86 16 93 % --   07/18/24 1945 37.4 C (99.3 F) 84 (!) 24 94 % --   07/18/24 1930 37.4 C (99.3 F) 85 (!) 22 94 % --   07/18/24 1915 37.4 C (99.3 F) 87 (!) 25 95 % --   07/18/24 1900 37.3 C (99.1 F) 85 16 -- --   07/18/24 1845 37.4 C (99.3 F) 85 15 94 % --   07/18/24 1830 37.4 C (99.3 F) 85 (!) 11 95 % --   07/18/24 1815 37.4 C (99.3 F) 86 17 94 % --   07/18/24 1800 37.3 C (99.1 F) (!) 105 14 94 % --   07/18/24 1745 37.3 C (99.1 F) (!) 114 (!) 22 91 % --   07/18/24 1730 37.3 C (99.1 F) 86 (!) 21 93 % --   07/18/24 1725 37.4 C (99.3 F) 86 17 93 % --   07/18/24 1721 37.3 C (99.1 F) 86 (!) 23 93 % --   07/18/24 1720 37.3 C (99.1 F) 86 (!) 21 93 % --   07/18/24 1715 37.3 C (99.1 F) 85 15 93 % --   07/18/24 1700 37.3 C (99.1 F) 82 20 93 % --   07/18/24 1645 37.3 C (99.1 F) 79 18 92 % --   07/18/24 1635 37.3 C (99.1 F) 81 15 95 % --   07/18/24 1630 37.4 C (99.3 F) 81 (!) 22 94 % --   07/18/24 1626 37.4 C (99.3 F) 80 13 93 % --   07/18/24 1615 37.4 C (99.3 F) 80 19 95 % --   07/18/24 1601 -- -- -- 96 % --   07/18/24 1600 37.4 C (99.3 F) 84 15 94 % --   07/18/24 1545 37.4 C (99.3 F) 80 18 94 % --   07/18/24 1530 37.4 C (99.3 F) 80 18 94 % --  07/18/24 1515 37.4 C (99.3 F) 80 15 94 % --   07/18/24 1500 37.6 C (99.7 F) 79 14 94 % --   07/18/24 1445 37.4 C (99.3 F) 79 13 90 % --   07/18/24 1430 37.4  C (99.3 F) 78 15 95 % --   07/18/24 1415 37.3 C (99.1 F) 80 20 100 % --   07/18/24 1400 -- 81 17 92 % --   07/18/24 1345 -- 80 18 93 % --   07/18/24 1330 -- 79 20 93 % --   07/18/24 1315 -- 79 18 93 % --   07/18/24 1300 -- 79 (!) 21 93 % --   07/18/24 1245 -- 79 18 95 % --   07/18/24 1230 -- 80 20 (!) 89 % --   07/18/24 1215 -- 80 16 91 % --   07/18/24 1212 -- 81 20 93 % --   07/18/24 1200 -- 81 19 97 % --   07/18/24 1150 -- 81 17 97 % --   07/18/24 1145 -- 80 20 96 % --   07/18/24 1130 -- 80 19 97 % --   07/18/24 1129 -- 80 17 99 % --   07/18/24 1115 -- 80 16 96 % --   07/18/24 1110 -- 80 15 97 % --      Physical Exam  General:  Sedated  EYES: no blurred vision or diploplia  ENMT: external appearance of ears and nose without scars or lesions, MMM  neck: trachea midline, no mass or thyromegaly  CVS: RRR +peripherial pulses no edema  Lungs:  Diminished bilaterally  abdomen: no mass or hepatosplenomegaly +bowel sounds nontender  skin: no rashes,lesions, or ulcers  musculoskeletal: no edema or swelling, no arthralgias, steady  neuro:  Sedated  psych:  Sedated    Labs:       CBC   Recent Labs     12-Aug-2024  0023 08/12/24  0442 12-Aug-2024  0810   WBC 5.1 5.8 6.1   HGB 8.3* 7.5* 7.4*   HCT 26.3* 23.4* 23.7*   PLTCNT 31* 34* 32*      Chemistries   Recent Labs     08-12-24  0023 08-12-2024  0024 Aug 12, 2024  0442 08-12-2024  0810 2024-08-12  1009   SODIUM 151*   < > 145  150* 151* 146*   POTASSIUM 3.6  --  3.5 3.7  --    CHLORIDE 102   < > 102  101 100 102   CO2 23  --  22 22  --    BUN 67*  --  69* 70*  --    CREATININE 1.98*  --  2.16* 2.24*  --    CALCIUM 9.5  --  9.4 9.4  --    ALBUMIN 3.0*  --  3.1* 3.2*  --    MAGNESIUM  2.1  --  2.2 2.2  --    PHOSPHORUS 4.9*  --  4.9* 5.3*  --     < > = values in this interval not displayed.      Liver Enzymes   Recent Labs     08-12-24  0023 2024-08-12  0442 08/12/24  0810   TOTALPROTEIN 5.1* 5.2* 5.3*   ALBUMIN 3.0* 3.1* 3.2*   AST 2,330* 2,194* 2,228*   ALT 987* 950* 971*   ALKPHOS 243*  238* 236*      Inflammatory Markers   Recent Labs     07/17/24  0126 07/17/24  0320  07/17/24  9360 07/17/24  1037 07/17/24  2356 07/18/24  0335 07/18/24  0621   CRP 8.5*  --   --   --   --   --   --    LACTICACID 19.2* 15.2* 7.1* 6.2* 5.3* 5.9* 4.7*      PROCALCITONIN:    Arterial Blood Gases   Recent Labs     07/18/24  1113 07/18/24  1443 07/18/24  1444 07/18/24  2003 07/18/24  2157 08-16-2024  0024 08/16/2024  0442 2024/08/16  0810 2024/08/16  1009   FI02 50  --  45 50   < > 50 50  --  40   PH 7.32*   < > 7.33* 7.25*  7.28*   < > 7.33* 7.30*  7.31* 7.39 7.16*   PCO2 57*  --  57* 64*   < > 47* 44  --  48*   PO2 139*  --  74* 80*   < > 97 109*  --  78*   BICARBONATE 27.1  --  27.4 24.9   < > 24.0 21.4  --  16.5*   BASEEXCESS 2.7  --  3.2* 0.0  --   --   --   --   --     < > = values in this interval not displayed.      Cardiac & Coags   Recent Labs     08/16/24  0023 2024/08/16  0442 2024/08/16  0810   INR 1.95* 1.91* 1.86*      Lipid Panel   No results for input(s): HDL, CHOL in the last 72 hours.    Invalid input(s): LDL     Microbiology:  Hospital Encounter on 07/08/24 (from the past 96 hours)   RESPIRATORY CULTURE AND GRAM STAIN, AEROBIC    Collection Time: 07/17/24  1:26 AM    Specimen: Sputum; Other    Narrative    The following orders were created for panel order RESPIRATORY CULTURE AND GRAM STAIN, AEROBIC.  Procedure                               Abnormality         Status                     ---------                               -----------         ------                     SPUTUM [id number]]                                    Final result                 Please view results for these tests on the individual orders.   STREP PNEUMONIAE AND LEGIONELLA ANTIGEN, URINE    Collection Time: 07/17/24  1:26 AM    Specimen: Urine, Site not specified   Culture Result Status    S.PNEUMONIAE ANTIGEN Negative Final    LEGIONELLA ANTIGEN Negative Final   MRSA SCREEN    Collection Time: 07/17/24  1:26 AM    Specimen:  Swab   Culture Result Status  MRSA  Final     No MRSA (Methicillin Resistant Staphylococcus aureus) Isolated   URINE CULTURE,ROUTINE    Collection Time: 07/17/24  1:26 AM    Specimen: Urine, Clean Catch   Culture Result Status    URINE CULTURE No Growth 18-24 hrs. Final   RESPIRATORY CULTURE AND GRAM STAIN (PERFORMABLE)    Collection Time: 07/17/24  1:26 AM    Specimen: Sputum   Culture Result Status    RESPIRATORY CULTURE Yeast (A) Preliminary    GRAM STAIN 4+ Many WBCs Preliminary    GRAM STAIN 1+ Rare Yeast Preliminary   RESPIRATORY CULTURE AND GRAM STAIN (PERFORMABLE)    Collection Time: 07/17/24  3:39 PM    Specimen: Sputum   Culture Result Status    GRAM STAIN 4+ Many WBCs Preliminary    GRAM STAIN 2+ Few Gram Positive Cocci in Pairs Preliminary    GRAM STAIN 2+ Few Hyphae Preliminary    GRAM STAIN 2+ Few Yeast Preliminary   ADULT ROUTINE BLOOD CULTURE, SET OF 2 ADULT BOTTLES (BACTERIA AND YEAST)    Collection Time: 07/17/24  9:35 PM    Specimen: Blood   Culture Result Status    BLOOD CULTURE, ROUTINE No Growth 18-24 hrs. Preliminary   ADULT ROUTINE BLOOD CULTURE, SET OF 2 ADULT BOTTLES (BACTERIA AND YEAST)    Collection Time: 07/17/24 11:01 PM    Specimen: Blood   Culture Result Status    BLOOD CULTURE, ROUTINE No Growth 18-24 hrs. Preliminary       Urinalysis:   Recent Labs     07/17/24  0126 07/17/24  0320 Jul 25, 2024  1009   LEUKOCYTES Negative  --   --    NITRITE Negative  --   --    UROBILINOGEN 1.0  --   --    PROTEIN 2+*  --   --    PH 7.08*   < > 7.16*   BILIRUBIN 2+*  --   --    GLUCOSE Negative   < > 101    < > = values in this interval not displayed.        Radiology:  CT BRAIN WO IV CONTRAST   Final Result   Abnormal   Subtle, slight increase size in left hemispheric subdural hematoma.      A Critical Red actionable finding has been sent via the Power[employer] Actionable Findings application on July 25, 2024 10:29 AM, Message ID [id number]. Receipt of this communication will be communicated to [facility]  RADIOLOGY STAFF or responsible provider and will be documented in PowerConnect Actionable Findings System upon receiving the acknowledgement.         Radiologist location ID: [id number]         XR AP MOBILE CHEST   Final Result   Support apparatus. Increasing bilateral consolidation.      .   .   .   s/d/g               Radiologist location ID: [id number]         CT CHEST ABDOMEN PELVIS WO IV CONTRAST   Final Result   Abnormal      1. Large volume hemoperitoneum, increased from 07/13/2024 CT. Persistent hyperattenuating material in the gallbladder fossa. Correlate with concern for ongoing extravasation.   2. Groundglass opacities in the right greater than left upper lobes new from prior CT suspicious for developing multifocal infectious process.    3. Endotracheal tube terminates above the carina but insinuates towards the right  mainstem bronchus; consider slight retraction. Enteric tube terminating in the gastric body.   4. Right infrahilar mass poorly delineated on noncontrast examination, similar in size to 07/08/2024 CT. Increasing adjacent groundglass opacity and nodularity which could represent infectious/inflammatory process versus progression.   5. Additional chronic findings as above.         A Critical Red actionable finding has been sent via the Power[employer] Actionable Findings application on 07/17/2024 3:57 AM, Message ID [id number]. Receipt of this communication will be communicated to [facility] RADIOLOGY STAFF or responsible provider and will be documented in Power[employer] Actionable Findings System upon receiving the acknowledgement.         Radiologist location ID: [id number]         CT BRAIN WO IV CONTRAST   Final Result   Small-volume left-sided subdural hemorrhage similar to prior CT. Negative for new or increasing blood products.            Radiologist location ID: [id number]         XR CHEST AP   Final Result      1. Right IJ central venous catheter in appropriate position.   2. Chronic  interstitial changes similar to prior radiograph; superimposed infectious process difficult to exclude.         Radiologist location ID: [id number]         NUC CHOLESCINTIGRAPHY HEPATO IMAGING WO EF   Final Result   * IMPRESSION:      * Study indicating hepatic dysfunction with delay in activity within the biliary system, activity within the expected region of the right paracolic gutter on 4 hour study is worrisome for but not clearly diagnostic of a bile duct leak, ERCP with positive contrast evaluation may certainly be of value         Radiologist location ID: [id number]         CT BRAIN WO IV CONTRAST   Final Result   Stable exam   .   .   .   s/d/g            Radiologist location ID: [id number]         CT BRAIN WO IV CONTRAST   Final Result   Abnormal   Small volume of acute hemorrhage in the subdural space overlying the left frontal lobe anteriorly and superiorly as detailed above. No significant change otherwise seen.   .   .   .   s/d/g      A Critical Red actionable finding has been sent via the PowerConnect Actionable Findings application on 07/15/2024 8:26 AM, Message ID [id number]. Receipt of this communication will be communicated to [facility] RADIOLOGY STAFF or responsible provider and will be documented in PowerConnect Actionable Findings System upon receiving the acknowledgement.      A Critical Red actionable finding has been sent via the PowerConnect Actionable Findings application on 07/15/2024 8:26 AM, Message ID [id number]. Receipt of this communication will be communicated to [facility] RADIOLOGY STAFF or responsible provider and will be documented in PowerConnect Actionable Findings System upon receiving the acknowledgement.         Radiologist location ID: [id number]         CT ABDOMEN PELVIS WO IV CONTRAST   Final Result      1. Grossly stable hematoma in the gallbladder fossa with slight decrease in volume of hemoperitoneum.   2. Additional stable findings as above.            Radiologist  location ID:  [id number]         CT ABDOMEN PELVIS WO IV CONTRAST   Final Result      1. Hematoma associated with the gallbladder fossa with fluid tracking inferiorly.       2. Moderate volume hemoperitoneum, more so blood lying in the dependent pelvis, though also around the liver and spleen and pericolic gutters.      3. Other findings as described above.         Radiologist location ID: [id number]         MRI MRCP WO CONTRAST   Final Result      * Mild extra hepatic bile duct dilatation as noted on CT 07/08/2024, no bile duct stone.      * No gallstones, there is pericholecystic fluid as well as fluid adjacent to the liver.      * Diffuse heterogeneous appearance to the liver as described above, this could certainly represent findings associated with acute hepatitis, differential considerations include regenerative nodularity in chronic liver disease, metastatic disease/lymphoma, and possibly sarcoid               Radiologist location ID: [id number]            Radiologist location ID: [id number]         US  RT UPPER QUADRANT   Final Result   Gallbladder wall thickening with pericholecystic fluid consistent with acute cholecystitis.      Dilated common bile duct without distal obstructing lesion identified.      Hepatomegaly with hepatic steatosis.      Trace ascites.               Radiologist location ID: [id number]         CT ANGIO CHEST FOR PULMONARY EMBOLUS W IV CONTRAST   Final Result      1. No evidence of pulmonary embolism.   2. Marginal increase in the size of the right infrahilar mass, right hilar and mediastinal lymphadenopathy compared to the prior study.   3. Other chronic findings as described above.            Radiologist location ID: [id number]         CT ABDOMEN PELVIS W IV CONTRAST   Final Result      1. Hepatomegaly, hepatic steatosis and hepatic lesions, not greatly changed and new pericholecystic fluid now present.   2. Multiple nonobstructing bilateral renal calculi.   3. New free  fluid in the pelvis.         Radiologist location ID: [id number]         XR AP MOBILE CHEST   Final Result      1. Right hilar adenopathy.      2. Vague pulmonary opacities could be mild pneumonia or atelectasis.            Radiologist location ID: [id number]                Assessment & Plan:  Active Hospital Problems    Diagnosis    Primary Problem: Acute hypoxic respiratory failure (CMS HCC)    Moderate protein-calorie malnutrition (CMS HCC)    Metastatic cancer to liver    Elevated LFTs    Hemoperitoneum    Bile leak    Subdural hematoma (CMS HCC)    NSVT (nonsustained ventricular tachycardia) (CMS HCC)    Heart failure, unspecified HF chronicity, unspecified heart failure type (CMS HCC)    Acute cholecystitis    Thickening of wall  of gallbladder with pericholecystic fluid    Symptomatic anemia    COVID-19    Acute blood loss anemia    Transaminitis    Malignant neoplasm of lung, unspecified laterality, unspecified part of lung (CMS HCC)    Pulmonary HTN (CMS HCC)    Pneumonia due to COVID-19 virus    Mood disorder (CMS HCC)    Anemia, unspecified type    Cigarette smoker    Metastasis to liver    History of breast cancer    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 Pain control with Fentanyl drip.    Nutrition, electrolytes & IV fluids: NPO due to  Pressor support..Hypokalemia requiring replacement  Hypomagnesemia requiring replacement Fluid replacement with none.    GI:   Last bowel movement several days ago. GI prophylaxis indicated.     Endocrine: Continue sliding scale insulin  only.  Recent Labs     07/17/24  1214 07/17/24  2040 07/18/24  0034 07/18/24  0413 07/18/24  2001 08-03-2024  0010 2024-08-03  0835   GLUIP 176* 188* 136* 149* 186* 193* 119*       Lines, drains, & access: Jugular CVL (Placed  )    DVT  Prophylaxis: Not indicated for low risk.    Counseling & family: Plan of care discussed with Family at bedside They express understanding and are in agreement. Code status confirmed as DNR. Prognosis is poor.    Seen and examined  Chart reviewed  Events noted     Replace lytes  Titrate IV vasopressors Levophed  to keep map above 65  Titrate vent for therapeutic pH and PO2  Add 3 amps sodium bicarbonate IV now secondary to severe acidosis  Continue IV antibiotics Zosyn/vancomycin changes Zosyn to cefepime  Kcentra 2000 units x1  Rebolus amiodarone  150 x1  Very poor prognosis updated extensively at bedside  Remains critically ill with organ failure and worsening shock   High risk for decompensation  Cct 30 minutes        Warren Mirza, APRN, CNP    This note may have been partially generated using MModal Fluency Direct system, and there may be some incorrect words, spellings, and punctuation that were not noted in checking the note before saving.    I personally saw and evaluated the patient as part of a shared service with an APP.    My substantive findings are:  MDM (complete) greater than 50% of medical decision-making by me personally.  I have addressed 1 or more acute illness requiring hospitalization and or causing organ dysfunction including:  Acute head bleed, acute hypoxic respiratory failure metastatic cancer to the liver    We will continue to titrate ventilator for P O2 greater than 60 and pH 7.34-7.45  I am going to send down for CT of the head to see if the bleeding component has worsened patient has received FFP and vitamin K if worsened we will give Kcentra and consider trying to start to wean steroids even though patient is on less pressors today  Family discussion was also made and discussed all options they are still deciding  Continue amiodarone  drip for monitoring liver and lung as well  Adjust sedation for RASS of -2  Add 3 amps of bicarb  Continue IV antibiotics vancomycin but we will change  Zosyn to cefepime  We will bolus amiodarone  as needed with 150 mg now  Prognosis very worrisome  The high probability of a clinically significant, sudden or life threatening deterioration of the Respiratory, circulatory, metabolic system(s) required my full and direct attention, intervention and personal management. The aggregate critical care time was 32 minutes. This is my personal time spent with/for this patient. This does not include and time by any other physician or mid-level provider. This time is in addition to time spent performing reported procedures but includes the following:   [x]  Data Review and interpretation   [x]  Patient assessment/exam and monitoring of vital signs   [x]  Documentation   [x]  Medication orders and management  [x]  Discussion with other health care providers including but not limited to; physicians, midlevel providers, nursing staff, pharmacy and respiratory therapy staff  [x]  Discussion of plan of treatment with family    Total cct 62 mins    Lynwood JONETTA Kiang III, DO  08-09-24 13:05

## 2024-08-10 NOTE — Expiration Summary (Signed)
 EXPIRATION SUMMARY        PATIENT NAME:  [name], [name]   MRN:  [id number]  DOB:  1956/04/13    ADMISSION DATE:  07/08/2024  EXPIRATION DATE:      PRIMARY CARE PHYSICIAN: Garvin Lemmings, FNP     ADMISSION DIAGNOSIS:  Symptomatic anemia [D64.9]    FINAL DIAGNOSIS:  metastatic lung cancer        Principle Problem:  Respiratory failure, unspecified chronicity, unspecified whether with hypoxia or hypercapnia (CMS [facility])    Active Hospital Problems    Diagnosis Date Noted    Principal Problem: Respiratory failure, unspecified chronicity, unspecified whether with hypoxia or hypercapnia (CMS HCC) [J96.90] 07/08/2024    Hypotension, unspecified hypotension type [I95.9] 2024/07/23    Moderate protein-calorie malnutrition (CMS HCC) [E44.0] 07/18/2024    Metastatic cancer to liver [C78.7] 07/18/2024    Elevated LFTs [R79.89] 07/17/2024    Hemoperitoneum [K66.1] 07/17/2024    Bile leak [K83.9] 07/17/2024    Subdural hematoma (CMS HCC) [S06.5XAA] 07/15/2024    NSVT (nonsustained ventricular tachycardia) (CMS HCC) [I47.29] 07/13/2024    Heart failure, unspecified HF chronicity, unspecified heart failure type (CMS HCC) [I50.9] 07/13/2024    Acute cholecystitis [K81.0] 07/11/2024    Thickening of wall of gallbladder with pericholecystic fluid [K82.8] 07/10/2024    Symptomatic anemia [D64.9] 07/10/2024    COVID-19 [U07.1] 07/09/2024    Acute blood loss anemia [D62] 07/09/2024    Transaminitis [R74.01] 07/09/2024    Malignant neoplasm of lung, unspecified laterality, unspecified part of lung (CMS HCC) [C34.90] 07/09/2024    Pulmonary HTN (CMS HCC) [I27.20] 07/09/2024    Pneumonia due to COVID-19 virus [U07.1, J12.82] 07/09/2024    Mood disorder (CMS HCC) [F39] 07/09/2024    Anemia, unspecified type [D64.9] 07/09/2024    Cigarette smoker [F17.210] 06/17/2024    Metastasis to liver [C78.7] 06/15/2024    History of breast cancer [Z85.3] 06/14/2024    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 female    [name] is a 68 y.o., White female w/recently diagnosed Small-cell carcinoma w/liver mets, COPD, current smoker - not on O2 at home, CABG. She was found to have lung mass on last admission and underwent bronchoscopy w/bx of station 7 which revealed the small cell carcinoma. She came to hospital  from outpatient infusion center to start chemotherapy but had dyspnea and hypoxia and was sent to ER for further evaluation. She was found to have COVID. She reports she's been feeling bad for the last few weeks. CTA chest - no PE, marginal increase in size of right infrahilar mass, right hilar and mediastinal lymphadenopathy compared to prior study. CT A/P: hepatomegaly, hepatic steatosis, and hepatic lesions not much change, new pericholecystic fluid now present. We are consulted for acute hypoxic respiratory failure, known patient.      [name] currently on 2 L NC/93%.  She is awake  and alert.  Feels breathing is doing okay at present time.  Has some mild cough.  No chest pain.  No fevers noted.  No N/V/D.  No associated symptoms or modifying factors.      10/2: Patient on 3LNC/96%. Awake and alert. Feels breathing is improving. She is s/p lap chole. No chest pain. No fevers noted. No n/v/d.  No associated symptoms or modifying factors.      10/3: Patient on room air/97%. Denies shortness of breath. Not much cough. No chest pain. Having bowel movements. No obvious signs of bleeding. Hgb 6.2 this morning, getting blood transfusion.  No associated symptoms or modifying factors.      10/4:  Today on 3L NC.  Received 2 units PRBCs yesterday.  Hemoglobin up to 8.1 today.  Receiving breathing treatment on exam.  Patient states she is not feeling much better today.     10/5:  On 2 L NC.  Hemoglobin dropped again this morning so had to get 1 unit PRBC.      10/6: Patient on 2LNC/92%. Fell last night. Moved to ICU for confusion and found to have small volume of acute hemorrhage in subdural space overlying the left frontal lobe anteriorly and superiorly. She is awake and alert. A/Ox 2, oriented to person, place. Daughter at bedside. Denies blurry vision. MAE x4 w/no weakness.  No associated symptoms or modifying factors.      10/7 - did well overnight.  No new issues reported.  Hemoglobin 6.5 and getting PRBC x1.  No bleeding noted.  CT head today shows small stable SDH.  Afebrile.  UIP adequate.  On 3 L/min supplemental oxygen.     10/8: Patient decompensated overnight requiring emergent intubation, initially maxed on 4 pressors, getting 2u PRBC and 2 FFP. HPI/ROS unable to be obtained d/t intubation and sedation.     10/9 - remains sedated and intubated on vent.  Titrating pressors. Developed Afib with RVR and NSVT. Started on amio drip.  T-max 38.3.  UO P adequate. Hb 6.9 and platelets 34 and is getting PRBC/FFP/platelets.     10/10 on vent/sedated remains on high-dose IV vasopressors.  PH 7.16 pCO2  48 this a.m    Long discussion with patients family regarding very poor prognosis.  In the setting of shock and multisystem organ failure.  They opted for comfort care support was withdrawn and patient passed at 3:33 p.m.  cc: Primary Care Physician:  Garvin Lemmings, FNP  [address]  [place] [address]    rr:[id number] Physician:  No referring provider defined for this encounter.  Warren Mirza, APRN, CNP

## 2024-08-10 NOTE — Nurses Notes (Signed)
 Restraint Continuation      Patient continues to have the following condition intubation with invasive lines.       Least restrictive alternatives attempted : concealed tubes/lines, decreased/removed stimulus, reoriented to person, place, time, and treatment, and assessed meds/medical problems    Patient continues to exhibit the following behaviors: uncooperative    The restraint continued to facilitate medical/surgical treatment to ensure safety.    The patient will continue to be evaluated and assessments documented on the flowsheet to ensure that the patient is released from the restraint at the earliest possible time

## 2024-08-10 NOTE — Nurses Notes (Signed)
 Restraint Discontinuation    The need for the restrain is no longer present and the patient's needs can be addressed using less restrictive alternatives.

## 2024-08-10 NOTE — Nurses Notes (Signed)
 Patient lines and foley removed at 1800. Delay in removal due to potential autopsy per family. Once family had decided to not follow-up with an autopsy, patient lines removed. One site of access left in patient for funeral home process. Confirmed with charge nurse that one line of access is okay to leave for embalming process.   Anisten Tomassi Buddie, Charity fundraiser

## 2024-08-10 NOTE — Respiratory Therapy (Signed)
 Pt extubated at 1521 to comfort measures.

## 2024-08-10 NOTE — Care Plan (Signed)
 Problem: Wound  Goal: Optimal Coping  Outcome: Ongoing (see interventions/notes)  Goal: Optimal Functional Ability  Outcome: Ongoing (see interventions/notes)  Goal: Absence of Infection Signs and Symptoms  Outcome: Ongoing (see interventions/notes)  Goal: Improved Oral Intake  Outcome: Ongoing (see interventions/notes)  Goal: Optimal Pain Control and Function  Outcome: Ongoing (see interventions/notes)  Goal: Skin Health and Integrity  Outcome: Ongoing (see interventions/notes)  Goal: Optimal Wound Healing  Outcome: Ongoing (see interventions/notes)     Problem: Adult Inpatient Plan of Care  Goal: Plan of Care Review  Outcome: Ongoing (see interventions/notes)  Goal: Patient-Specific Goal (Individualized)  Outcome: Ongoing (see interventions/notes)  Goal: Optimal Comfort and Wellbeing  Outcome: Ongoing (see interventions/notes)  Goal: Rounds/Family Conference  Outcome: Ongoing (see interventions/notes)     Problem: Malnutrition  Goal: Improved Nutritional Intake  Outcome: Ongoing (see interventions/notes)     Problem: Mechanical Ventilation Invasive  Goal: Effective Communication  Outcome: Ongoing (see interventions/notes)  Goal: Optimal Device Function  Outcome: Ongoing (see interventions/notes)  Goal: Mechanical Ventilation Liberation  Outcome: Ongoing (see interventions/notes)  Goal: Optimal Nutrition Delivery  Outcome: Ongoing (see interventions/notes)  Goal: Absence of Device-Related Skin and Tissue Injury  Outcome: Ongoing (see interventions/notes)  Goal: Absence of Ventilator-Induced Lung Injury  Outcome: Ongoing (see interventions/notes)     Problem: Non-violent/Non-Self Destructive Restraints  Goal: Alternative methods tried prior to restraints  Outcome: Ongoing (see interventions/notes)  Goal: Patient free from injury and discomfort  Outcome: Ongoing (see interventions/notes)  Goal: Autonomy maintained at the highest possible level  Outcome: Ongoing (see interventions/notes)  Goal: Need for restraints  reassessed per policy  Outcome: Ongoing (see interventions/notes)  Goal: Patient education provided  Outcome: Ongoing (see interventions/notes)  Goal: Problem Interventions  Outcome: Ongoing (see interventions/notes)     Problem: Skin Injury Risk Increased  Goal: Skin Health and Integrity  Outcome: Ongoing (see interventions/notes)     Problem: Gas Exchange Impaired  Goal: Optimal Gas Exchange  Outcome: Ongoing (see interventions/notes)

## 2024-08-10 NOTE — Pharmacy (Signed)
 [facility]  Department of Pharmaceutical Services  Therapeutic Drug Monitoring: Vancomycin  2024-07-23      [name], 68 y.o. female  Date of Birth:  Sep 14, 1956  Date of Admission:  07/08/2024  MRN# [id number]    Weight: 79.3 kg (174 lb 13.2 oz)  Ideal body weight: 47.8 kg (105 lb 6.1 oz)  Adjusted ideal body weight: 60.4 kg (133 lb 2.5 oz)   Body mass index is 33.03 kg/m.    Subjective / Objective     Reason for Consult: [name] is a 68 y.o. female on vancomycin with an indication of Pneumonia with a goal trough of 13-17 mcg/mL, and, if applicable, an AUC/MIC of 400-600 mcg*hr/mL. Pharmacy is consulted for management of vancomycin.      Requesting Provider: Sharyle [name]     Renal Replacement Therapy: None    Vitals:  Temperature: 36.7 C (98.1 F)  Heart Rate: 88  BP (Non-Invasive): (!) 127/53  Respiratory Rate: 16    Recent Labs:  Lab Results   Component Value Date    WBC 6.1 Jul 23, 2024    BUN 70 (H) July 23, 2024    CREATININE 2.24 (H) Jul 23, 2024    PRCAL 79.80 (H) 07/17/2024    CRP 8.5 (H) 07/17/2024    VANCOMYCIN 17.8 2024/07/23     Serum creatinine: 2.24 mg/dL (H) 89/89/74 9189  Estimated creatinine clearance: 23.2 mL/min (A)    Vancomycin Dosing & Monitoring    Loading Dose / Date / Time: 1000 mg x1 10/8 @0354     Date RPh SCr Current Dose Frequency Level / AUC Comment(s)   10/8 BLC  1.56 Intermittent -  -  Level with AM labs 10/9   10/9 KLR  1.6 Intermittent  -  Random 8.1 1250 x1    10/10 KlR  2.24 Intermittent - Random 17.8 Give 1250 x1                        Assessment / Plan    [name] therapeutic on current regimen with intermittent dosing, ordered IV vancomycin 1250 x1 for 1200  Patient with AKI Scr 2.24 mg/dl (Baseline ~9.2 mg/dl), urine output 9.72 ml/kg/hr las 24 hours (lasix  40 mg BID ordered)   Next vancomycin level(s) due 10/11 @1400   Continue to monitor renal function and response to therapy  Pharmacy will continue to follow [name]'s drug therapy  with the primary team; please contact covering pharmacist with any questions or concerns    [name], PHARMD  x [phone]  07-23-24, 10:23    Target levels depends on dosing and monitoring method, AUC vs. trough-based; for AUC-based dosing, units are mcg*hr/mL; for trough-based dosing, units are mcg/mL    Creatinine clearance is estimated using the Cockcroft-Gault equation for adult patients and the [name] for pediatric patients    The Medical Executive Committee at [facility] has granted pharmacists via protocol order the ability to place or discontinue vancomycin level orders as they deem clinically appropriate and place related labs (e.g., CBC/BMP) at least every 3 days or more frequently as they deem clinically appropriate for routine monitoring

## 2024-08-10 DEATH — deceased

## 2024-08-13 ENCOUNTER — Encounter: Payer: Self-pay | Admitting: Pediatrics

## 2024-08-13 ENCOUNTER — Ambulatory Visit: Admitting: Pediatrics

## 2024-08-13 VITALS — BP 126/82 | HR 87 | Ht 65.0 in | Wt 184.0 lb

## 2024-08-13 DIAGNOSIS — K449 Diaphragmatic hernia without obstruction or gangrene: Secondary | ICD-10-CM | POA: Diagnosis not present

## 2024-08-13 DIAGNOSIS — K224 Dyskinesia of esophagus: Secondary | ICD-10-CM

## 2024-08-13 DIAGNOSIS — K222 Esophageal obstruction: Secondary | ICD-10-CM | POA: Diagnosis not present

## 2024-08-13 NOTE — Patient Instructions (Signed)
 _______________________________________________________  If your blood pressure at your visit was 140/90 or greater, please contact your primary care physician to follow up on this.  _______________________________________________________  If you are age 67 or older, your body mass index should be between 23-30. Your Body mass index is 30.62 kg/m. If this is out of the aforementioned range listed, please consider follow up with your Primary Care Provider.  If you are age 70 or younger, your body mass index should be between 19-25. Your Body mass index is 30.62 kg/m. If this is out of the aformentioned range listed, please consider follow up with your Primary Care Provider.   ________________________________________________________  [facility] providers would like to encourage you to use MYCHART to communicate with providers for non-urgent requests or questions.  Due to long hold times on the telephone, sending your provider a message by [facility] may be a faster and more efficient way to get a response.  Please allow 48 business hours for a response.  Please remember that this is for non-urgent requests.  _______________________________________________________  [facility] is using a team-based approach to care.  Your team is made up of your doctor and two to three APPS. Our APPS (Nurse Practitioners and Physician Assistants) work with your physician to ensure care continuity for you. They are fully qualified to address your health concerns and develop a treatment plan. They communicate directly with your gastroenterologist to care for you. Seeing the Advanced Practice Practitioners on your physician's team can help you by facilitating care more promptly, often allowing for earlier appointments, access to diagnostic testing, procedures, and other specialty referrals.   I appreciate the opportunity to care for you. Inocente Hausen, MD

## 2024-08-14 ENCOUNTER — Encounter: Payer: Self-pay | Admitting: Pediatrics

## 2024-08-15 ENCOUNTER — Ambulatory Visit: Payer: Self-pay | Admitting: Internal Medicine

## 2024-08-15 ENCOUNTER — Ambulatory Visit: Payer: Medicare HMO | Admitting: Internal Medicine

## 2024-08-15 ENCOUNTER — Encounter: Payer: Self-pay | Admitting: Internal Medicine

## 2024-08-15 VITALS — BP 124/80 | HR 81 | Temp 98.0°F | Ht 64.75 in | Wt 180.7 lb

## 2024-08-15 DIAGNOSIS — E538 Deficiency of other specified B group vitamins: Secondary | ICD-10-CM

## 2024-08-15 DIAGNOSIS — Z170 Estrogen receptor positive status [ER+]: Secondary | ICD-10-CM | POA: Diagnosis not present

## 2024-08-15 DIAGNOSIS — C50211 Malignant neoplasm of upper-inner quadrant of right female breast: Secondary | ICD-10-CM | POA: Diagnosis not present

## 2024-08-15 DIAGNOSIS — R7302 Impaired glucose tolerance (oral): Secondary | ICD-10-CM

## 2024-08-15 DIAGNOSIS — E559 Vitamin D deficiency, unspecified: Secondary | ICD-10-CM | POA: Diagnosis not present

## 2024-08-15 DIAGNOSIS — Z0000 Encounter for general adult medical examination without abnormal findings: Secondary | ICD-10-CM

## 2024-08-15 DIAGNOSIS — Z6828 Body mass index (BMI) 28.0-28.9, adult: Secondary | ICD-10-CM | POA: Diagnosis not present

## 2024-08-15 LAB — LIPID PANEL
Cholesterol: 134 mg/dL (ref 0–200)
HDL: 59.9 mg/dL (ref >39.00)
LDL Cholesterol: 61 mg/dL (ref 0–99)
NonHDL: 74.51
Total CHOL/HDL Ratio: 2
Triglycerides: 69 mg/dL (ref 0.0–149.0)
VLDL: 13.8 mg/dL (ref 0.0–40.0)

## 2024-08-15 LAB — COMPREHENSIVE METABOLIC PANEL WITH GFR
ALT: 11 U/L (ref 0–35)
AST: 15 U/L (ref 0–37)
Albumin: 4.5 g/dL (ref 3.5–5.2)
Alkaline Phosphatase: 111 U/L (ref 39–117)
BUN: 21 mg/dL (ref 6–23)
CO2: 30 meq/L (ref 19–32)
Calcium: 9.9 mg/dL (ref 8.4–10.5)
Chloride: 102 meq/L (ref 96–112)
Creatinine, Ser: 0.73 mg/dL (ref 0.40–1.20)
GFR: 85.12 mL/min (ref >60.00)
Glucose, Bld: 84 mg/dL (ref 70–99)
Potassium: 5.2 meq/L — ABNORMAL HIGH (ref 3.5–5.1)
Sodium: 139 meq/L (ref 135–145)
Total Bilirubin: 0.7 mg/dL (ref 0.2–1.2)
Total Protein: 7.6 g/dL (ref 6.0–8.3)

## 2024-08-15 LAB — CBC WITH DIFFERENTIAL/PLATELET
Basophils Absolute: 0 K/uL (ref 0.0–0.1)
Basophils Relative: 0.7 % (ref 0.0–3.0)
Eosinophils Absolute: 0.1 K/uL (ref 0.0–0.7)
Eosinophils Relative: 2.4 % (ref 0.0–5.0)
HCT: 39.2 % (ref 36.0–46.0)
Hemoglobin: 13.2 g/dL (ref 12.0–15.0)
Lymphocytes Relative: 23.3 % (ref 12.0–46.0)
Lymphs Abs: 1.2 K/uL (ref 0.7–4.0)
MCHC: 33.8 g/dL (ref 30.0–36.0)
MCV: 93.9 fl (ref 78.0–100.0)
Monocytes Absolute: 0.3 K/uL (ref 0.1–1.0)
Monocytes Relative: 5.9 % (ref 3.0–12.0)
Neutro Abs: 3.5 K/uL (ref 1.4–7.7)
Neutrophils Relative %: 67.7 % (ref 43.0–77.0)
Platelets: 266 K/uL (ref 150.0–400.0)
RBC: 4.17 Mil/uL (ref 3.87–5.11)
RDW: 13.9 % (ref 11.5–15.5)
WBC: 5.2 K/uL (ref 4.0–10.5)

## 2024-08-15 LAB — TSH: TSH: 1.35 u[IU]/mL (ref 0.35–5.50)

## 2024-08-15 LAB — VITAMIN D 25 HYDROXY (VIT D DEFICIENCY, FRACTURES): VITD: 43.9 ng/mL (ref 30.00–100.00)

## 2024-08-15 LAB — VITAMIN B12: Vitamin B-12: 588 pg/mL (ref 211–911)

## 2024-08-15 LAB — HEMOGLOBIN A1C: Hgb A1c MFr Bld: 5.6 % (ref 4.6–6.5)

## 2024-08-15 NOTE — Progress Notes (Signed)
 Established Patient Office Visit     CC/Reason for Visit: Annual preventive exam  HPI: Judith Thomas is a 68 y.o. female who is coming in today for the above mentioned reasons. Past Medical History is significant for: Breast cancer on anastrozole , impaired glucose tolerance, vitamin D  and B12 deficiencies.  Feeling well without acute concerns or complaints.  Has routine eye and dental care.  Due for COVID-vaccine.  She has an upcoming appointment with GYN.  Breast and colon cancer screening are up-to-date.   Past Medical/Surgical History: Past Medical History:  Diagnosis Date   Breast cancer (HCC)    right   Family history of bladder cancer    Family history of breast cancer    Knee pain, chronic 10/11/2015    Past Surgical History:  Procedure Laterality Date   BREAST LUMPECTOMY WITH RADIOACTIVE SEED AND SENTINEL LYMPH NODE BIOPSY Right 03/08/2019   Procedure: RIGHT BREAST RADIOACTIVE SEED LUMPECTOMY X2 AND SENTINEL LYMPH NODE BIOPSY;  Surgeon: Curvin Deward MOULD, MD;  Location: [facility];  Service: General;  Laterality: Right;   BREAST REDUCTION SURGERY Left 02/09/2023   Procedure: BREAST REDUCTION WITH LIPOSUCTION;  Surgeon: Lowery Estefana RAMAN, DO;  Location: [facility];  Service: Plastics;  Laterality: Left;   COLONOSCOPY  03/2010   none     PORT-A-CATH REMOVAL Left 12/30/2019   Procedure: REMOVAL PORT-A-CATH;  Surgeon: Curvin Deward MOULD, MD;  Location: [facility];  Service: General;  Laterality: Left;   PORTACATH PLACEMENT Left 11/07/2018   Procedure: INSERTION PORT-A-CATH WITH ULTRASOUND;  Surgeon: Curvin Deward MOULD, MD;  Location:  SURGERY CENTER;  Service: General;  Laterality: Left;    Social History:  reports that she has never smoked. She has never used smokeless tobacco. She reports current alcohol use. She reports that she does not use drugs.  Allergies: Allergies  Allergen Reactions   Paclitaxel  Other (See  Comments)    Nausea, flushing    Family History:  Family History  Problem Relation Age of Onset   Liver disease Mother    Heart disease Mother        afib   Heart disease Father    Asthma Father    Diabetes Father    Bladder Cancer Father        smoker   Breast cancer Maternal Aunt    Breast cancer Maternal Aunt        mets to bone   Heart disease Maternal Grandfather    Kidney disease Paternal Grandmother    Heart disease Paternal Grandfather    Heart disease Other    Colon cancer Neg Hx    Colon polyps Neg Hx    Esophageal cancer Neg Hx    Rectal cancer Neg Hx    Stomach cancer Neg Hx      Current Outpatient Medications:    anastrozole  (ARIMIDEX ) 1 MG tablet, TAKE 1 TABLET BY MOUTH EVERY DAY, Disp: 90 tablet, Rfl: 3   Calcium Carbonate-Vit D-Min (CALCIUM 1200 PO), Take by mouth., Disp: , Rfl:    cholecalciferol (VITAMIN D3) 25 MCG (1000 UNIT) tablet, Take 1,000 Units by mouth daily., Disp: , Rfl:    cyanocobalamin  (VITAMIN B12) 1000 MCG tablet, Take 1,000 mcg by mouth daily., Disp: , Rfl:   Review of Systems:  Negative unless indicated in HPI.   Physical Exam: Vitals:   08/15/24 0916  BP: 124/80  Pulse: 81  Temp: 98 F (36.7 C)  TempSrc: Oral  SpO2: 98%  Weight: 180 lb 11.2 oz (82 kg)  Height: 5' 4.75 (1.645 m)    Body mass index is 30.3 kg/m.   Physical Exam Vitals reviewed.  Constitutional:      General: She is not in acute distress.    Appearance: Normal appearance. She is not ill-appearing, toxic-appearing or diaphoretic.  HENT:     Head: Normocephalic.     Right Ear: Tympanic membrane, ear canal and external ear normal. There is no impacted cerumen.     Left Ear: Tympanic membrane, ear canal and external ear normal. There is no impacted cerumen.     Nose: Nose normal.     Mouth/Throat:     Mouth: Mucous membranes are moist.     Pharynx: Oropharynx is clear. No oropharyngeal exudate or posterior oropharyngeal erythema.  Eyes:     General:  No scleral icterus.       Right eye: No discharge.        Left eye: No discharge.     Conjunctiva/sclera: Conjunctivae normal.     Pupils: Pupils are equal, round, and reactive to light.  Neck:     Vascular: No carotid bruit.  Cardiovascular:     Rate and Rhythm: Normal rate and regular rhythm.     Pulses: Normal pulses.     Heart sounds: Normal heart sounds.  Pulmonary:     Effort: Pulmonary effort is normal. No respiratory distress.     Breath sounds: Normal breath sounds.  Abdominal:     General: Abdomen is flat. Bowel sounds are normal.     Palpations: Abdomen is soft.  Musculoskeletal:        General: Normal range of motion.     Cervical back: Normal range of motion.  Skin:    General: Skin is warm and dry.  Neurological:     General: No focal deficit present.     Mental Status: She is alert and oriented to person, place, and time. Mental status is at baseline.  Psychiatric:        Mood and Affect: Mood normal.        Behavior: Behavior normal.        Thought Content: Thought content normal.        Judgment: Judgment normal.      Impression and Plan:  Encounter for preventive health examination  BMI 28.0-28.9,adult  IGT (impaired glucose tolerance) -     Hemoglobin A1c; Future  Vitamin D  deficiency -     VITAMIN D  25 Hydroxy (Vit-D Deficiency, Fractures); Future  Vitamin B12 deficiency -     Vitamin B12; Future  Malignant neoplasm of upper-inner quadrant of right breast in female, estrogen receptor positive (HCC) -     CBC with Differential/Platelet; Future -     Comprehensive metabolic panel with GFR; Future -     Lipid panel; Future -     TSH; Future   -Recommend routine eye and dental care. -Healthy lifestyle discussed in detail. -Labs to be updated today. -Prostate cancer screening: Not applicable Health Maintenance  Topic Date Due   COVID-19 Vaccine (6 - 2025-26 season) 06/10/2024   Breast Cancer Screening  11/23/2024   Medicare Annual Wellness  Visit  08/08/2025   Colon Cancer Screening  05/03/2028   DTaP/Tdap/Td vaccine (3 - Td or Tdap) 08/25/2031   Pneumococcal Vaccine for age over 56  Completed   Flu Shot  Completed   DEXA scan (bone density measurement)  Completed   Hepatitis C Screening  Completed   Zoster (Shingles) Vaccine  Completed   Meningitis B Vaccine  Aged Out       Celestine Prim Theophilus Andrews, MD [facility]

## 2024-08-23 ENCOUNTER — Encounter (INDEPENDENT_AMBULATORY_CARE_PROVIDER_SITE_OTHER): Payer: Self-pay | Admitting: Family

## 2024-08-26 ENCOUNTER — Encounter: Payer: Self-pay | Admitting: Hematology and Oncology

## 2024-08-28 DIAGNOSIS — Z853 Personal history of malignant neoplasm of breast: Secondary | ICD-10-CM | POA: Diagnosis not present

## 2024-08-28 DIAGNOSIS — Z124 Encounter for screening for malignant neoplasm of cervix: Secondary | ICD-10-CM | POA: Diagnosis not present

## 2024-08-28 DIAGNOSIS — Z01419 Encounter for gynecological examination (general) (routine) without abnormal findings: Secondary | ICD-10-CM | POA: Diagnosis not present

## 2024-10-04 ENCOUNTER — Ambulatory Visit (INDEPENDENT_AMBULATORY_CARE_PROVIDER_SITE_OTHER): Payer: Self-pay | Admitting: NURSE PRACTITIONER

## 2025-03-18 ENCOUNTER — Ambulatory Visit (INDEPENDENT_AMBULATORY_CARE_PROVIDER_SITE_OTHER): Payer: Self-pay | Admitting: INTERNAL MEDICINE

## 2025-07-17 ENCOUNTER — Ambulatory Visit: Admitting: Hematology and Oncology

## 2025-08-19 ENCOUNTER — Encounter: Admitting: Internal Medicine
